# Patient Record
Sex: Male | Born: 1955
Health system: Southern US, Community
[De-identification: ages and names within clinical notes are randomized; demographics above are authoritative.]

## PROBLEM LIST (undated history)

## (undated) DIAGNOSIS — I10 Essential (primary) hypertension: Secondary | ICD-10-CM

## (undated) DIAGNOSIS — N529 Male erectile dysfunction, unspecified: Secondary | ICD-10-CM

## (undated) DIAGNOSIS — G4733 Obstructive sleep apnea (adult) (pediatric): Secondary | ICD-10-CM

## (undated) DIAGNOSIS — I519 Heart disease, unspecified: Secondary | ICD-10-CM

## (undated) DIAGNOSIS — E291 Testicular hypofunction: Secondary | ICD-10-CM

## (undated) DIAGNOSIS — Q2112 Patent foramen ovale: Secondary | ICD-10-CM

## (undated) DIAGNOSIS — M545 Low back pain, unspecified: Secondary | ICD-10-CM

## (undated) DIAGNOSIS — I2510 Atherosclerotic heart disease of native coronary artery without angina pectoris: Secondary | ICD-10-CM

## (undated) DIAGNOSIS — K219 Gastro-esophageal reflux disease without esophagitis: Secondary | ICD-10-CM

## (undated) DIAGNOSIS — E1165 Type 2 diabetes mellitus with hyperglycemia: Secondary | ICD-10-CM

## (undated) DIAGNOSIS — I5022 Chronic systolic (congestive) heart failure: Secondary | ICD-10-CM

## (undated) DIAGNOSIS — E1121 Type 2 diabetes mellitus with diabetic nephropathy: Secondary | ICD-10-CM

## (undated) DIAGNOSIS — E782 Mixed hyperlipidemia: Secondary | ICD-10-CM

## (undated) DIAGNOSIS — I4891 Unspecified atrial fibrillation: Secondary | ICD-10-CM

## (undated) DIAGNOSIS — R001 Bradycardia, unspecified: Secondary | ICD-10-CM

## (undated) DIAGNOSIS — F32A Depression, unspecified: Secondary | ICD-10-CM

## (undated) DIAGNOSIS — I509 Heart failure, unspecified: Secondary | ICD-10-CM

## (undated) DIAGNOSIS — I499 Cardiac arrhythmia, unspecified: Secondary | ICD-10-CM

## (undated) DIAGNOSIS — I480 Paroxysmal atrial fibrillation: Secondary | ICD-10-CM

## (undated) DIAGNOSIS — I495 Sick sinus syndrome: Secondary | ICD-10-CM

## (undated) DIAGNOSIS — I739 Peripheral vascular disease, unspecified: Secondary | ICD-10-CM

## (undated) DIAGNOSIS — N189 Chronic kidney disease, unspecified: Secondary | ICD-10-CM

## (undated) DIAGNOSIS — E119 Type 2 diabetes mellitus without complications: Secondary | ICD-10-CM

## (undated) HISTORY — DX: Patent foramen ovale: Q21.12

## (undated) HISTORY — DX: Essential (primary) hypertension: I10

## (undated) HISTORY — DX: Bradycardia, unspecified: R00.1

## (undated) HISTORY — DX: Peripheral vascular disease, unspecified: I73.9

## (undated) HISTORY — DX: Chronic systolic (congestive) heart failure: I50.22

## (undated) HISTORY — DX: Male erectile dysfunction, unspecified: N52.9

## (undated) HISTORY — DX: Mixed hyperlipidemia: E78.2

## (undated) HISTORY — DX: Type 2 diabetes mellitus with diabetic nephropathy: E11.21

## (undated) HISTORY — DX: Testicular hypofunction: E29.1

## (undated) HISTORY — DX: Heart disease, unspecified: I51.9

## (undated) HISTORY — PX: CARDIAC CATHETERIZATION: SHX172

## (undated) HISTORY — DX: Low back pain, unspecified: M54.50

## (undated) HISTORY — DX: Paroxysmal atrial fibrillation: I48.0

## (undated) HISTORY — DX: Sick sinus syndrome: I49.5

## (undated) HISTORY — DX: Type 2 diabetes mellitus with hyperglycemia: E11.65

## (undated) HISTORY — DX: Atherosclerotic heart disease of native coronary artery without angina pectoris: I25.10

## (undated) HISTORY — DX: Obstructive sleep apnea (adult) (pediatric): G47.33

---

## 2014-07-28 DIAGNOSIS — E1140 Type 2 diabetes mellitus with diabetic neuropathy, unspecified: Secondary | ICD-10-CM | POA: Insufficient documentation

## 2014-07-28 DIAGNOSIS — IMO0002 Reserved for concepts with insufficient information to code with codable children: Secondary | ICD-10-CM

## 2014-07-28 DIAGNOSIS — I480 Paroxysmal atrial fibrillation: Secondary | ICD-10-CM

## 2014-07-28 DIAGNOSIS — E1165 Type 2 diabetes mellitus with hyperglycemia: Secondary | ICD-10-CM

## 2014-07-28 DIAGNOSIS — E782 Mixed hyperlipidemia: Secondary | ICD-10-CM

## 2014-07-28 DIAGNOSIS — E1121 Type 2 diabetes mellitus with diabetic nephropathy: Secondary | ICD-10-CM | POA: Insufficient documentation

## 2014-07-28 HISTORY — DX: Mixed hyperlipidemia: E78.2

## 2014-07-28 HISTORY — DX: Type 2 diabetes mellitus with hyperglycemia: E11.65

## 2014-07-28 HISTORY — DX: Paroxysmal atrial fibrillation: I48.0

## 2014-07-28 HISTORY — DX: Reserved for concepts with insufficient information to code with codable children: IMO0002

## 2014-07-28 HISTORY — DX: Type 2 diabetes mellitus with diabetic neuropathy, unspecified: E11.40

## 2014-07-30 DIAGNOSIS — I10 Essential (primary) hypertension: Secondary | ICD-10-CM | POA: Insufficient documentation

## 2014-07-30 DIAGNOSIS — I110 Hypertensive heart disease with heart failure: Secondary | ICD-10-CM | POA: Insufficient documentation

## 2014-07-30 HISTORY — DX: Essential (primary) hypertension: I10

## 2014-07-30 HISTORY — DX: Hypertensive heart disease with heart failure: I11.0

## 2014-08-30 DIAGNOSIS — I5022 Chronic systolic (congestive) heart failure: Secondary | ICD-10-CM

## 2014-08-30 DIAGNOSIS — I5042 Chronic combined systolic (congestive) and diastolic (congestive) heart failure: Secondary | ICD-10-CM | POA: Insufficient documentation

## 2014-08-30 HISTORY — DX: Chronic systolic (congestive) heart failure: I50.22

## 2014-09-20 ENCOUNTER — Ambulatory Visit: Admit: 2014-09-20 | Disposition: A | Discharge status: Home / Self Care | Payer: Self-pay | Admitting: Internal Medicine

## 2015-01-18 ENCOUNTER — Ambulatory Visit: Payer: BLUE CROSS/BLUE SHIELD | Admitting: Anesthesiology

## 2015-01-18 ENCOUNTER — Encounter: Payer: Self-pay | Admitting: *Deleted

## 2015-01-18 ENCOUNTER — Ambulatory Visit
Admission: RE | Admit: 2015-01-18 | Discharge: 2015-01-18 | Disposition: A | Discharge status: Home / Self Care | Payer: BLUE CROSS/BLUE SHIELD | Source: Ambulatory Visit | Attending: Internal Medicine | Admitting: Internal Medicine

## 2015-01-18 ENCOUNTER — Encounter
Admission: RE | Disposition: A | Discharge status: Home / Self Care | Payer: Self-pay | Source: Ambulatory Visit | Attending: Internal Medicine

## 2015-01-18 DIAGNOSIS — Z8249 Family history of ischemic heart disease and other diseases of the circulatory system: Secondary | ICD-10-CM | POA: Insufficient documentation

## 2015-01-18 DIAGNOSIS — Z79899 Other long term (current) drug therapy: Secondary | ICD-10-CM | POA: Insufficient documentation

## 2015-01-18 DIAGNOSIS — I480 Paroxysmal atrial fibrillation: Secondary | ICD-10-CM | POA: Diagnosis present

## 2015-01-18 DIAGNOSIS — Z813 Family history of other psychoactive substance abuse and dependence: Secondary | ICD-10-CM | POA: Insufficient documentation

## 2015-01-18 DIAGNOSIS — Z7901 Long term (current) use of anticoagulants: Secondary | ICD-10-CM | POA: Insufficient documentation

## 2015-01-18 DIAGNOSIS — Z87891 Personal history of nicotine dependence: Secondary | ICD-10-CM | POA: Insufficient documentation

## 2015-01-18 DIAGNOSIS — E785 Hyperlipidemia, unspecified: Secondary | ICD-10-CM | POA: Diagnosis not present

## 2015-01-18 DIAGNOSIS — I5022 Chronic systolic (congestive) heart failure: Secondary | ICD-10-CM | POA: Insufficient documentation

## 2015-01-18 DIAGNOSIS — Z801 Family history of malignant neoplasm of trachea, bronchus and lung: Secondary | ICD-10-CM | POA: Diagnosis not present

## 2015-01-18 DIAGNOSIS — I10 Essential (primary) hypertension: Secondary | ICD-10-CM | POA: Insufficient documentation

## 2015-01-18 HISTORY — DX: Cardiac arrhythmia, unspecified: I49.9

## 2015-01-18 HISTORY — DX: Essential (primary) hypertension: I10

## 2015-01-18 HISTORY — DX: Type 2 diabetes mellitus without complications: E11.9

## 2015-01-18 HISTORY — PX: ELECTROPHYSIOLOGIC STUDY: SHX172A

## 2015-01-18 HISTORY — DX: Heart failure, unspecified: I50.9

## 2015-01-18 HISTORY — DX: Unspecified atrial fibrillation: I48.91

## 2015-01-18 LAB — GLUCOSE, CAPILLARY: GLUCOSE-CAPILLARY: 109 mg/dL — AB (ref 65–99)

## 2015-01-18 SURGERY — CARDIOVERSION (CATH LAB)
Anesthesia: General

## 2015-01-18 MED ORDER — MIDAZOLAM HCL 2 MG/2ML IJ SOLN
INTRAMUSCULAR | Status: DC | PRN
  Administered 2015-01-18: 2 mg via INTRAVENOUS

## 2015-01-18 MED ORDER — LIDOCAINE HCL (CARDIAC) 20 MG/ML IV SOLN
INTRAVENOUS | Status: DC | PRN
  Administered 2015-01-18: 30 mg via INTRAVENOUS

## 2015-01-18 MED ORDER — PROPOFOL 10 MG/ML IV BOLUS
INTRAVENOUS | Status: DC | PRN
  Administered 2015-01-18: 60 mg via INTRAVENOUS

## 2015-01-18 MED ORDER — SODIUM CHLORIDE 0.9 % IV SOLN
INTRAVENOUS | Status: DC
  Administered 2015-01-18: 08:00:00 via INTRAVENOUS

## 2015-01-18 MED ORDER — FENTANYL CITRATE (PF) 100 MCG/2ML IJ SOLN
INTRAMUSCULAR | Status: DC | PRN
Start: 2015-01-18 — End: 2015-01-18
  Administered 2015-01-18: 50 ug via INTRAVENOUS

## 2015-01-18 NOTE — Transfer of Care (Signed)
Immediate Anesthesia Transfer of Care Note  Patient: [name] [name]  Procedure(s) Performed: Procedure(s): CARDIOVERSION (N/A)  Patient Location: PACU  Anesthesia Type:General  Level of Consciousness: sedated  Airway & Oxygen Therapy: Patient Spontanous Breathing and Patient connected to nasal cannula oxygen  Post-op Assessment: Report given to RN and Post -op Vital signs reviewed and stable  Post vital signs: Reviewed and stable  Last Vitals:  Filed Vitals:   01/18/15 0707  BP: 157/82  Pulse: 85  Temp: 37.1 C  Resp: 16    Complications: No apparent anesthesia complications

## 2015-01-18 NOTE — Anesthesia Procedure Notes (Signed)
Performed by: Allean Found Pre-anesthesia Checklist: Patient identified, Emergency Drugs available, Suction available, Patient being monitored and Timeout performed Oxygen Delivery Method: Nasal cannula Preoxygenation: Pre-oxygenation with 100% oxygen Intubation Type: IV induction Placement Confirmation: positive ETCO2

## 2015-01-18 NOTE — Anesthesia Postprocedure Evaluation (Signed)
  Anesthesia Post-op Note  Patient: [name] [name]  Procedure(s) Performed: Procedure(s): CARDIOVERSION (N/A)  Anesthesia type:General  Patient location: PACU  Post pain: Pain level controlled  Post assessment: Post-op Vital signs reviewed, Patient's Cardiovascular Status Stable, Respiratory Function Stable, Patent Airway and No signs of Nausea or vomiting  Post vital signs: Reviewed and stable  Last Vitals:  Filed Vitals:   01/18/15 0833  BP: 118/63  Pulse: 54  Temp:   Resp: 18    Level of consciousness: awake, alert  and patient cooperative  Complications: No apparent anesthesia complications

## 2015-01-18 NOTE — Anesthesia Preprocedure Evaluation (Signed)
Anesthesia Evaluation    Airway Mallampati: III       Dental  (+) Partial Upper, Partial Lower   Pulmonary former smoker,          Cardiovascular hypertension, +CHF + dysrhythmias Atrial Fibrillation Rhythm:irregular Rate:Normal  Able to climb 2 flights without difficulty   Neuro/Psych    GI/Hepatic   Endo/Other  diabetes  Renal/GU      Musculoskeletal   Abdominal   Peds  Hematology   Anesthesia Other Findings   Reproductive/Obstetrics                             Anesthesia Physical Anesthesia Plan  ASA: III  Anesthesia Plan: General   Post-op Pain Management:    Induction:   Airway Management Planned:   Additional Equipment:   Intra-op Plan:   Post-operative Plan:   Informed Consent: I have reviewed the patients History and Physical, chart, labs and discussed the procedure including the risks, benefits and alternatives for the proposed anesthesia with the patient or authorized representative who has indicated his/her understanding and acceptance.     Plan Discussed with:   Anesthesia Plan Comments:         Anesthesia Quick Evaluation

## 2015-01-18 NOTE — Progress Notes (Signed)
Pt for cardioversion with transfer of care to Anesthesia with monitoring if VS and meds given by anesthesia. Refer to Anesthesia record for details. Dr. Nehemiah Massed notified at 733AM that pt is ready.

## 2015-01-18 NOTE — CV Procedure (Signed)
Electrical Cardioversion Procedure Note [name] [id number] 1956/04/07  Procedure: Electrical Cardioversion Indications:  Atrial Fibrillation  Procedure Details Consent: Risks of procedure as well as the alternatives and risks of each were explained to the (patient/caregiver).  Consent for procedure obtained. Time Out: Verified patient identification, verified procedure, site/side was marked, verified correct patient position, special equipment/implants available, medications/allergies/relevent history reviewed, required imaging and test results available.  Performed  Patient placed on cardiac monitor, pulse oximetry, supplemental oxygen as necessary.  Sedation given: Benzodiazepines and Short-acting barbiturates Pacer pads placed anterior and posterior chest.  Cardioverted 4 time(s).  Cardioverted at [place].  Evaluation Findings: Post procedure EKG shows: NSR Complications: None Patient did tolerate procedure well.   [name] 01/18/2015, 7:51 AM

## 2015-01-25 DIAGNOSIS — R001 Bradycardia, unspecified: Secondary | ICD-10-CM

## 2015-01-25 HISTORY — DX: Bradycardia, unspecified: R00.1

## 2015-01-30 NOTE — Addendum Note (Signed)
Addendum  created 01/30/15 1912 by Molli Barrows, MD   Modules edited: Anesthesia Attestations

## 2015-03-02 ENCOUNTER — Encounter: Payer: Self-pay | Admitting: Internal Medicine

## 2015-08-29 ENCOUNTER — Ambulatory Visit
Admission: RE | Admit: 2015-08-29 | Discharge: 2015-08-29 | Disposition: A | Discharge status: Home / Self Care | Payer: Disability Insurance | Source: Ambulatory Visit | Attending: Family Medicine | Admitting: Family Medicine

## 2015-08-29 ENCOUNTER — Other Ambulatory Visit: Payer: Self-pay | Admitting: Family Medicine

## 2015-08-29 DIAGNOSIS — M5136 Other intervertebral disc degeneration, lumbar region: Secondary | ICD-10-CM | POA: Diagnosis not present

## 2015-08-29 DIAGNOSIS — M25552 Pain in left hip: Secondary | ICD-10-CM | POA: Diagnosis present

## 2015-08-29 DIAGNOSIS — M545 Low back pain: Secondary | ICD-10-CM | POA: Insufficient documentation

## 2016-04-21 DIAGNOSIS — I495 Sick sinus syndrome: Secondary | ICD-10-CM

## 2016-04-21 HISTORY — DX: Sick sinus syndrome: I49.5

## 2017-04-28 DIAGNOSIS — E66813 Obesity, class 3: Secondary | ICD-10-CM

## 2017-04-28 DIAGNOSIS — I519 Heart disease, unspecified: Secondary | ICD-10-CM | POA: Insufficient documentation

## 2017-04-28 HISTORY — DX: Heart disease, unspecified: I51.9

## 2017-04-28 HISTORY — DX: Obesity, class 3: E66.813

## 2017-04-28 HISTORY — DX: Morbid (severe) obesity due to excess calories: E66.01

## 2017-05-26 DIAGNOSIS — R0600 Dyspnea, unspecified: Secondary | ICD-10-CM | POA: Diagnosis not present

## 2017-05-26 DIAGNOSIS — E559 Vitamin D deficiency, unspecified: Secondary | ICD-10-CM | POA: Diagnosis not present

## 2017-05-26 DIAGNOSIS — E662 Morbid (severe) obesity with alveolar hypoventilation: Secondary | ICD-10-CM | POA: Diagnosis not present

## 2017-05-26 DIAGNOSIS — R05 Cough: Secondary | ICD-10-CM | POA: Diagnosis not present

## 2017-05-26 DIAGNOSIS — G4733 Obstructive sleep apnea (adult) (pediatric): Secondary | ICD-10-CM | POA: Diagnosis not present

## 2017-05-26 DIAGNOSIS — R5383 Other fatigue: Secondary | ICD-10-CM | POA: Diagnosis not present

## 2017-06-13 DIAGNOSIS — G4733 Obstructive sleep apnea (adult) (pediatric): Secondary | ICD-10-CM | POA: Diagnosis not present

## 2017-07-17 DIAGNOSIS — E559 Vitamin D deficiency, unspecified: Secondary | ICD-10-CM | POA: Diagnosis not present

## 2017-07-17 DIAGNOSIS — G4733 Obstructive sleep apnea (adult) (pediatric): Secondary | ICD-10-CM | POA: Diagnosis not present

## 2017-07-17 DIAGNOSIS — J4530 Mild persistent asthma, uncomplicated: Secondary | ICD-10-CM | POA: Diagnosis not present

## 2017-07-17 DIAGNOSIS — E662 Morbid (severe) obesity with alveolar hypoventilation: Secondary | ICD-10-CM | POA: Diagnosis not present

## 2017-07-31 DIAGNOSIS — I4891 Unspecified atrial fibrillation: Secondary | ICD-10-CM | POA: Diagnosis not present

## 2017-07-31 DIAGNOSIS — Z6841 Body Mass Index (BMI) 40.0 and over, adult: Secondary | ICD-10-CM | POA: Diagnosis not present

## 2017-07-31 DIAGNOSIS — E1140 Type 2 diabetes mellitus with diabetic neuropathy, unspecified: Secondary | ICD-10-CM | POA: Diagnosis not present

## 2017-07-31 DIAGNOSIS — I10 Essential (primary) hypertension: Secondary | ICD-10-CM | POA: Diagnosis not present

## 2017-07-31 DIAGNOSIS — E782 Mixed hyperlipidemia: Secondary | ICD-10-CM | POA: Diagnosis not present

## 2017-08-08 DIAGNOSIS — G4733 Obstructive sleep apnea (adult) (pediatric): Secondary | ICD-10-CM | POA: Diagnosis not present

## 2017-08-28 DIAGNOSIS — R5383 Other fatigue: Secondary | ICD-10-CM | POA: Diagnosis not present

## 2017-08-28 DIAGNOSIS — J4530 Mild persistent asthma, uncomplicated: Secondary | ICD-10-CM | POA: Diagnosis not present

## 2017-08-28 DIAGNOSIS — E559 Vitamin D deficiency, unspecified: Secondary | ICD-10-CM | POA: Diagnosis not present

## 2017-08-28 DIAGNOSIS — G4733 Obstructive sleep apnea (adult) (pediatric): Secondary | ICD-10-CM | POA: Diagnosis not present

## 2017-09-08 DIAGNOSIS — G4733 Obstructive sleep apnea (adult) (pediatric): Secondary | ICD-10-CM | POA: Diagnosis not present

## 2017-09-21 DIAGNOSIS — H40003 Preglaucoma, unspecified, bilateral: Secondary | ICD-10-CM | POA: Diagnosis not present

## 2017-09-21 DIAGNOSIS — E119 Type 2 diabetes mellitus without complications: Secondary | ICD-10-CM | POA: Diagnosis not present

## 2017-09-21 DIAGNOSIS — Z7984 Long term (current) use of oral hypoglycemic drugs: Secondary | ICD-10-CM | POA: Diagnosis not present

## 2017-10-13 DIAGNOSIS — G4733 Obstructive sleep apnea (adult) (pediatric): Secondary | ICD-10-CM | POA: Diagnosis not present

## 2017-10-13 DIAGNOSIS — J4530 Mild persistent asthma, uncomplicated: Secondary | ICD-10-CM | POA: Diagnosis not present

## 2017-10-13 DIAGNOSIS — R5383 Other fatigue: Secondary | ICD-10-CM | POA: Diagnosis not present

## 2017-10-13 DIAGNOSIS — E559 Vitamin D deficiency, unspecified: Secondary | ICD-10-CM | POA: Diagnosis not present

## 2017-11-04 DIAGNOSIS — I10 Essential (primary) hypertension: Secondary | ICD-10-CM | POA: Diagnosis not present

## 2017-11-04 DIAGNOSIS — I4891 Unspecified atrial fibrillation: Secondary | ICD-10-CM | POA: Diagnosis not present

## 2017-11-04 DIAGNOSIS — E782 Mixed hyperlipidemia: Secondary | ICD-10-CM | POA: Diagnosis not present

## 2017-11-04 DIAGNOSIS — G4733 Obstructive sleep apnea (adult) (pediatric): Secondary | ICD-10-CM | POA: Diagnosis not present

## 2017-11-04 DIAGNOSIS — E1140 Type 2 diabetes mellitus with diabetic neuropathy, unspecified: Secondary | ICD-10-CM | POA: Diagnosis not present

## 2017-11-13 DIAGNOSIS — G4733 Obstructive sleep apnea (adult) (pediatric): Secondary | ICD-10-CM | POA: Diagnosis not present

## 2017-11-13 DIAGNOSIS — R5383 Other fatigue: Secondary | ICD-10-CM | POA: Diagnosis not present

## 2017-11-13 DIAGNOSIS — I739 Peripheral vascular disease, unspecified: Secondary | ICD-10-CM | POA: Diagnosis not present

## 2017-11-13 DIAGNOSIS — E559 Vitamin D deficiency, unspecified: Secondary | ICD-10-CM | POA: Diagnosis not present

## 2017-11-13 DIAGNOSIS — J4530 Mild persistent asthma, uncomplicated: Secondary | ICD-10-CM | POA: Diagnosis not present

## 2017-11-19 DIAGNOSIS — N289 Disorder of kidney and ureter, unspecified: Secondary | ICD-10-CM | POA: Diagnosis not present

## 2017-11-20 DIAGNOSIS — I739 Peripheral vascular disease, unspecified: Secondary | ICD-10-CM | POA: Diagnosis not present

## 2018-01-21 DIAGNOSIS — L03031 Cellulitis of right toe: Secondary | ICD-10-CM | POA: Diagnosis not present

## 2018-01-21 DIAGNOSIS — L03116 Cellulitis of left lower limb: Secondary | ICD-10-CM | POA: Diagnosis not present

## 2018-01-21 DIAGNOSIS — I4891 Unspecified atrial fibrillation: Secondary | ICD-10-CM | POA: Diagnosis not present

## 2018-01-21 DIAGNOSIS — R609 Edema, unspecified: Secondary | ICD-10-CM | POA: Diagnosis not present

## 2018-02-10 DIAGNOSIS — E1140 Type 2 diabetes mellitus with diabetic neuropathy, unspecified: Secondary | ICD-10-CM | POA: Diagnosis not present

## 2018-02-10 DIAGNOSIS — I4891 Unspecified atrial fibrillation: Secondary | ICD-10-CM | POA: Diagnosis not present

## 2018-02-10 DIAGNOSIS — I10 Essential (primary) hypertension: Secondary | ICD-10-CM | POA: Diagnosis not present

## 2018-02-10 DIAGNOSIS — R782 Finding of cocaine in blood: Secondary | ICD-10-CM | POA: Diagnosis not present

## 2018-02-25 ENCOUNTER — Encounter: Payer: Self-pay | Admitting: Cardiology

## 2018-02-25 ENCOUNTER — Ambulatory Visit (INDEPENDENT_AMBULATORY_CARE_PROVIDER_SITE_OTHER): Payer: Medicare Other | Admitting: Cardiology

## 2018-02-25 VITALS — BP 124/62 | HR 79 | Ht 70.0 in | Wt 294.4 lb

## 2018-02-25 DIAGNOSIS — Z7901 Long term (current) use of anticoagulants: Secondary | ICD-10-CM

## 2018-02-25 DIAGNOSIS — I480 Paroxysmal atrial fibrillation: Secondary | ICD-10-CM | POA: Diagnosis not present

## 2018-02-25 DIAGNOSIS — I495 Sick sinus syndrome: Secondary | ICD-10-CM

## 2018-02-25 DIAGNOSIS — I110 Hypertensive heart disease with heart failure: Secondary | ICD-10-CM | POA: Diagnosis not present

## 2018-02-25 DIAGNOSIS — I5022 Chronic systolic (congestive) heart failure: Secondary | ICD-10-CM

## 2018-02-25 DIAGNOSIS — G4733 Obstructive sleep apnea (adult) (pediatric): Secondary | ICD-10-CM | POA: Diagnosis not present

## 2018-02-25 HISTORY — DX: Long term (current) use of anticoagulants: Z79.01

## 2018-02-25 MED ORDER — TORSEMIDE 20 MG PO TABS: 20.0000 mg | ORAL_TABLET | Freq: Two times a day (BID) | ORAL | 3 refills | Status: DC

## 2018-02-25 NOTE — Progress Notes (Signed)
Cardiology Office Note:    Date:  02/25/2018   ID:  [name] [name], DOB [birth year], MRN [id number]  PCP:  Cyndi Bender, PA-C  Cardiologist:  Shirlee More, MD    Referring MD: Cyndi Bender, PA-C    ASSESSMENT:    1. Paroxysmal atrial fibrillation (HCC)   2. Chronic anticoagulation   3. Sinus node dysfunction (HCC)   4. Hypertensive heart disease with heart failure ([place])   5. Chronic systolic CHF (congestive heart failure), NYHA class 3 (HCC)    PLAN:    In order of problems listed above:  1. Stable continue his anticoagulant I am concerned about his cardiomyopathy EF at one time was severely reduced normalized in 2017 now is decompensated heart failure we will recheck echocardiogram if EF is significantly reduced we would need to come up with alternative antiarrhythmic therapy or preferably referral for EP catheter ablation with atrial fibrillation and heart failure.  His EKG shows no evidence of toxicity from his antiarrhythmic drug 2. Continue his anticoagulant 3. Stable CMP Holter monitor in 2018 showing no atrial fibrillation normal sinus node function and no ventricular tachycardia 4. Heart failure is decompensated he has marked fluid overload we will switch from furosemide to torsemide 1 week at his PCP office recheck renal function as well as liver function proBNP and echocardiogram.  I anticipate seeing him back in 3 months to begin to weigh daily also asked him to stop adding dietary salt and stop gabapentin which causes intense sodium retention.  Stop hydrochlorothiazide   Next appointment: 3 months   Medication Adjustments/Labs and Tests Ordered: Current medicines are reviewed at length with the patient today.  Concerns regarding medicines are outlined above.  No orders of the defined types were placed in this encounter.  No orders of the defined types were placed in this encounter.   No chief complaint on file.   History of Present Illness:    [name] [name] is  a 62 y.o. male with a hx of paroxysmal atrial fibrillation treated with Multaq and Xarelto Xarelto previous cardiomyopathy with normalization of left ventricular function heart failure and sick sinus syndrome with bradycardia after cardioversion last seen by me at [facility] cardiology 10/27/2016. Compliance with diet, lifestyle and medications: No he is adding salt to his diet  He does not feel well he finds himself very short of breath with physical activity initially bending over using his upper extremities but he is pleased with the recent response to sleep apnea with CPAP.  He has had brief episodes of rapid heart rhythm but not severe sustained tolerates his anticoagulant without complication no chest pain syncope TIA.  I reviewed records available from his cardiology care at [facility] office and the patient wishes to reestablish care with me Past Medical History:  Diagnosis Date  . Atrial fibrillation ([place])   . Benign essential HTN 07/30/2014  . Bradycardia 01/25/2015  . CHF (congestive heart failure) ([place])   . Chronic systolic CHF (congestive heart failure), NYHA class 3 ([place]) 08/30/2014   Overview:  Global ef 30%  . Diabetes mellitus without complication ([place])   . Dysrhythmia   . Hypertension   . LV dysfunction 04/28/2017  . Mixed hyperlipidemia 07/28/2014  . Paroxysmal atrial fibrillation ([place]) 07/28/2014  . Sinus node dysfunction ([place]) 04/21/2016  . Uncontrolled type 2 diabetes mellitus with microalbuminuric diabetic nephropathy ([place]) 07/28/2014    Past Surgical History:  Procedure Laterality Date  . ELECTROPHYSIOLOGIC STUDY N/A 01/18/2015   Procedure: CARDIOVERSION;  Surgeon: [name], MD;  Location: [facility] ORS;  Service: Cardiovascular;  Laterality: N/A;    Current Medications: No outpatient medications have been marked as taking for the 02/25/18 encounter (Appointment) with Richardo Priest, MD.     Allergies:   Patient has no known allergies.   Social  History   Socioeconomic History  . Marital status: Married    Spouse name: Not on file  . Number of children: Not on file  . Years of education: Not on file  . Highest education level: Not on file  Occupational History  . Not on file  Social Needs  . Financial resource strain: Not on file  . Food insecurity:    Worry: Not on file    Inability: Not on file  . Transportation needs:    Medical: Not on file    Non-medical: Not on file  Tobacco Use  . Smoking status: Former Smoker    Last attempt to quit: 01/18/2012    Years since quitting: 6.1  . Smokeless tobacco: Never Used  Substance and Sexual Activity  . Alcohol use: No  . Drug use: Yes    Frequency: 1.0 times per week    Types: Marijuana  . Sexual activity: Not on file  Lifestyle  . Physical activity:    Days per week: Not on file    Minutes per session: Not on file  . Stress: Not on file  Relationships  . Social connections:    Talks on phone: Not on file    Gets together: Not on file    Attends religious service: Not on file    Active member of club or organization: Not on file    Attends meetings of clubs or organizations: Not on file    Relationship status: Not on file  Other Topics Concern  . Not on file  Social History Narrative  . Not on file     Family History: The patient's family history includes CAD in his brother and mother; Drug abuse in his brother; Lung cancer in his father. ROS:   Please see the history of present illness.    All other systems reviewed and are negative.  EKGs/Labs/Other Studies Reviewed:    The following studies were reviewed today:  EKG:  EKG ordered today.  The ekg ordered today demonstrates [facility] and normal  Recent Labs: Labs from TPN shows a cholesterol 148 HDL 45 LDL 81 and mildly elevated creatinine 1.24.  These labs were performed 11/19/2017.   Physical Exam:    VS:  There were no vitals taken for this visit.    Wt Readings from Last 3 Encounters:  01/18/15  253 lb (114.8 kg)     GEN:  Well nourished, well developed in no acute distress HEENT: Normal NECK: No JVD; No carotid bruits LYMPHATICS: No lymphadenopathy CARDIAC: RRR, no murmurs, rubs, gallops RESPIRATORY:  Clear to auscultation without rales, wheezing or rhonchi  ABDOMEN: Soft, non-tender, non-distended MUSCULOSKELETAL:  3-4+ bilateral to the thigh edema; No deformity  SKIN: Warm and dry NEUROLOGIC:  Alert and oriented x 3 PSYCHIATRIC:  Normal affect    Signed, Shirlee More, MD  02/25/2018 7:50 AM    [place]

## 2018-02-25 NOTE — Patient Instructions (Addendum)
Medication Instructions:  Your physician has recommended you make the following change in your medication:   STOP gabapentin  STOP furosemide (lasix) STOP hydrochlorothiazide   START torsemide (demadex) 20 mg twice daily   Labwork: Your physician recommends that you return for lab work in 1 week: CMP, ProBNP at PCP office.   Testing/Procedures: You had an EKG today.   Your physician has requested that you have an echocardiogram. Echocardiography is a painless test that uses sound waves to create images of your heart. It provides your doctor with information about the size and shape of your heart and how well your heart's chambers and valves are working. This procedure takes approximately one hour. There are no restrictions for this procedure.  Follow-Up: Your physician wants you to follow-up in: 3 months. You will receive a reminder letter in the mail two months in advance. If you don't receive a letter, please call our office to schedule the follow-up appointment.   If you need a refill on your cardiac medications before your next appointment, please call your pharmacy.   Thank you for choosing [facility]! Robyne Peers, RN [phone]

## 2018-02-26 DIAGNOSIS — J4530 Mild persistent asthma, uncomplicated: Secondary | ICD-10-CM | POA: Diagnosis not present

## 2018-02-26 DIAGNOSIS — E559 Vitamin D deficiency, unspecified: Secondary | ICD-10-CM | POA: Diagnosis not present

## 2018-02-26 DIAGNOSIS — G4733 Obstructive sleep apnea (adult) (pediatric): Secondary | ICD-10-CM | POA: Diagnosis not present

## 2018-04-12 ENCOUNTER — Other Ambulatory Visit: Payer: Self-pay

## 2018-04-12 ENCOUNTER — Ambulatory Visit (INDEPENDENT_AMBULATORY_CARE_PROVIDER_SITE_OTHER): Payer: Medicare Other

## 2018-04-12 DIAGNOSIS — I5022 Chronic systolic (congestive) heart failure: Secondary | ICD-10-CM

## 2018-04-12 DIAGNOSIS — I480 Paroxysmal atrial fibrillation: Secondary | ICD-10-CM | POA: Diagnosis not present

## 2018-04-12 MED ORDER — PERFLUTREN LIPID MICROSPHERE: 1.0000 mL | INTRAVENOUS | Status: AC | PRN

## 2018-04-12 NOTE — Progress Notes (Addendum)
Complete echocardiogram with contrast has been performed.   Jimmy Asya Derryberry RDCS, RVT 

## 2018-05-12 DIAGNOSIS — Z7984 Long term (current) use of oral hypoglycemic drugs: Secondary | ICD-10-CM | POA: Diagnosis not present

## 2018-05-12 DIAGNOSIS — H04123 Dry eye syndrome of bilateral lacrimal glands: Secondary | ICD-10-CM | POA: Diagnosis not present

## 2018-05-12 DIAGNOSIS — H40003 Preglaucoma, unspecified, bilateral: Secondary | ICD-10-CM | POA: Diagnosis not present

## 2018-05-12 DIAGNOSIS — H2513 Age-related nuclear cataract, bilateral: Secondary | ICD-10-CM | POA: Diagnosis not present

## 2018-05-13 DIAGNOSIS — Z125 Encounter for screening for malignant neoplasm of prostate: Secondary | ICD-10-CM | POA: Diagnosis not present

## 2018-05-13 DIAGNOSIS — E782 Mixed hyperlipidemia: Secondary | ICD-10-CM | POA: Diagnosis not present

## 2018-05-13 DIAGNOSIS — E1140 Type 2 diabetes mellitus with diabetic neuropathy, unspecified: Secondary | ICD-10-CM | POA: Diagnosis not present

## 2018-05-13 DIAGNOSIS — I10 Essential (primary) hypertension: Secondary | ICD-10-CM | POA: Diagnosis not present

## 2018-05-13 DIAGNOSIS — I4891 Unspecified atrial fibrillation: Secondary | ICD-10-CM | POA: Diagnosis not present

## 2018-05-13 DIAGNOSIS — Z79899 Other long term (current) drug therapy: Secondary | ICD-10-CM | POA: Diagnosis not present

## 2018-05-25 NOTE — Progress Notes (Signed)
Cardiology Office Note:    Date:  05/26/2018   ID:  Basim Bartnik, DOB 1956-02-13, MRN [id number]  PCP:  Cyndi Bender, PA-C  Cardiologist:  Shirlee More, MD    Referring MD: Cyndi Bender, PA-C    ASSESSMENT:    1. Hypertensive heart disease with heart failure ([place])   2. Chronic combined systolic and diastolic heart failure (HCC)   3. Paroxysmal atrial fibrillation ([place])   4. Uncontrolled type 2 diabetes mellitus with microalbuminuric diabetic nephropathy ([place])   5. Chronic anticoagulation   6. Mixed hyperlipidemia   7. High risk medication use    PLAN:    In order of problems listed above:  1. He is decompensated he has edema takes Actos discontinued and unfortunately is not weighing daily adding salt to his diet.  I increased his diuretic 2. See above ejection fraction improved with resumption of sinus rhythm 3. Continue anticoagulant check EKG continue his antiarrhythmic drug 4. Improved A1c at target stop his Actos 5. Continue his anticoagulant 6. Duration to continue with statin he has arrangements for follow-up lipid profile 7. Check EKG on Rythmol   Next appointment: 3 months    Medication Adjustments/Labs and Tests Ordered: Current medicines are reviewed at length with the patient today.  Concerns regarding medicines are outlined above.  No orders of the defined types were placed in this encounter.  No orders of the defined types were placed in this encounter.   Chief Complaint  Patient presents with  . Follow-up  . Atrial Fibrillation  . Congestive Heart Failure    History of Present Illness:    Rhonda Vangieson is a 62 y.o. male with a hx of paroxysmal atrial fibrillation treated with Multaq and Xarelto,  previous cardiomyopathy with normalization of left ventricular function heart failure and sick sinus syndrome with bradycardia after cardioversion  He was last seen 02/25/2018. Compliance with diet, lifestyle and medications: No he does not weigh  himself and continues to add salt to his food  He is improved but disappointed that is not back to normal he remains short of breath has edema he is taking Actos which is contraindicated with heart failure I will stop it today as his A1c is at target and strongly encouraged him to weigh daily and stop adding salt to his diet will increase the dose of his diuretic.  He is on a antiarrhythmic drug for atrial fibrillation we will check his EKG he appears to be in sinus rhythm and tolerates his anticoagulant without bleeding complication.  Recently had lipids done as severe dyslipidemia with a LDL at 207 and was placed on a high intensity statin he has arrangements for follow-up lipid profile with his PCP and I strongly encouraged him to follow through he also has CKD with a creatinine of 1.79 and microalbuminuria. Past Medical History:  Diagnosis Date  . Atrial fibrillation ([place])   . Benign essential HTN 07/30/2014  . Bradycardia 01/25/2015  . CHF (congestive heart failure) ([place])   . Chronic systolic CHF (congestive heart failure), NYHA class 3 ([place]) 08/30/2014   Overview:  Global ef 30%  . Diabetes mellitus without complication ([place])   . Dysrhythmia   . Hypertension   . LV dysfunction 04/28/2017  . Mixed hyperlipidemia 07/28/2014  . Paroxysmal atrial fibrillation ([place]) 07/28/2014  . Sinus node dysfunction ([place]) 04/21/2016  . Uncontrolled type 2 diabetes mellitus with microalbuminuric diabetic nephropathy ([place]) 07/28/2014    Past Surgical History:  Procedure Laterality Date  . ELECTROPHYSIOLOGIC STUDY N/A  01/18/2015   Procedure: CARDIOVERSION;  Surgeon: [name], MD;  Location: [facility] ORS;  Service: Cardiovascular;  Laterality: N/A;    Current Medications: Current Meds  Medication Sig  . albuterol (PROVENTIL HFA;VENTOLIN HFA) 108 (90 Base) MCG/ACT inhaler INHALE 2 PUFFS BY MOUTH EVERY 4 TO 6 HOURS AS NEEDED  . atorvastatin (LIPITOR) 40 MG tablet TAKE 1 TABLET BY MOUTH ONCE DAILY FOR CHOLESTEROL    . BREO ELLIPTA 100-25 MCG/INH AEPB INHALE 1 PUFF BY MOUTH ONCE DAILY  . glipiZIDE (GLUCOTROL) 10 MG tablet Take 10 mg by mouth 2 (two) times daily before a meal.  . lisinopril (PRINIVIL,ZESTRIL) 10 MG tablet Take 1 tablet by mouth daily.  . metFORMIN (GLUCOPHAGE) 1000 MG tablet Take 1,000 mg by mouth 2 (two) times daily with a meal.  . pioglitazone (ACTOS) 45 MG tablet Take 45 mg by mouth daily.  . propafenone (RYTHMOL) 225 MG tablet Take 1 tablet by mouth 2 (two) times daily.  . rivaroxaban (XARELTO) 20 MG TABS tablet Take 20 mg by mouth daily with supper.  . sitaGLIPtin (JANUVIA) 100 MG tablet Take 100 mg by mouth daily.  [name] torsemide (DEMADEX) 20 MG tablet Take 1 tablet (20 mg total) by mouth 2 (two) times daily.  . Vitamin D, Ergocalciferol, (DRISDOL) 50000 units CAPS capsule Take 1 capsule by mouth once a week.     Allergies:   Patient has no known allergies.   Social History   Socioeconomic History  . Marital status: Married    Spouse name: Not on file  . Number of children: Not on file  . Years of education: Not on file  . Highest education level: Not on file  Occupational History  . Not on file  Social Needs  . Financial resource strain: Not on file  . Food insecurity:    Worry: Not on file    Inability: Not on file  . Transportation needs:    Medical: Not on file    Non-medical: Not on file  Tobacco Use  . Smoking status: Former Smoker    Last attempt to quit: 01/17/2002    Years since quitting: 16.3  . Smokeless tobacco: Never Used  Substance and Sexual Activity  . Alcohol use: No  . Drug use: Yes    Frequency: 1.0 times per week    Types: Marijuana  . Sexual activity: Not on file  Lifestyle  . Physical activity:    Days per week: Not on file    Minutes per session: Not on file  . Stress: Not on file  Relationships  . Social connections:    Talks on phone: Not on file    Gets together: Not on file    Attends religious service: Not on file    Active  member of club or organization: Not on file    Attends meetings of clubs or organizations: Not on file    Relationship status: Not on file  Other Topics Concern  . Not on file  Social History Narrative  . Not on file     Family History: The patient's family history includes CAD in his brother and mother; Drug abuse in his brother; Lung cancer in his father. ROS:   Please see the history of present illness.    All other systems reviewed and are negative.  EKGs/Labs/Other Studies Reviewed:    The following studies were reviewed today:  EKG:  EKG ordered today.  The ekg ordered today demonstrates sinus rhythm and is normal including  QT interval  Echo 04/12/18 with EF 60-65%  Recent Labs:   05/13/2018 cholesterol 301 LDL 207 creatinine 1.79 A1c 6.7   Physical Exam:    VS:  BP 132/62 (BP Location: Right Arm, Patient Position: Sitting, Cuff Size: Large)   Pulse 91   Ht 5\' 10"  (1.778 m)   Wt 298 lb (135.2 kg)   SpO2 96%   BMI 42.76 kg/m     Wt Readings from Last 3 Encounters:  05/26/18 298 lb (135.2 kg)  02/25/18 294 lb 6.4 oz (133.5 kg)  01/18/15 253 lb (114.8 kg)     GEN:   Well nourished, well developed in no acute distress HEENT: Normal NECK: No JVD; No carotid bruits LYMPHATICS: No lymphadenopathy CARDIAC:  RRR, no murmurs, rubs, gallops RESPIRATORY:  Clear to auscultation without rales, wheezing or rhonchi  ABDOMEN: Soft, non-tender, non-distended MUSCULOSKELETAL:  2= to the knee bilateral edema; No deformity  SKIN: Warm and dry NEUROLOGIC:  Alert and oriented x 3 PSYCHIATRIC:  Normal affect    Signed, Shirlee More, MD  05/26/2018 10:25 AM    [place]

## 2018-05-26 ENCOUNTER — Ambulatory Visit (INDEPENDENT_AMBULATORY_CARE_PROVIDER_SITE_OTHER): Payer: Medicare Other | Admitting: Cardiology

## 2018-05-26 ENCOUNTER — Encounter: Payer: Self-pay | Admitting: Cardiology

## 2018-05-26 VITALS — BP 132/62 | HR 91 | Ht 70.0 in | Wt 298.0 lb

## 2018-05-26 DIAGNOSIS — E782 Mixed hyperlipidemia: Secondary | ICD-10-CM

## 2018-05-26 DIAGNOSIS — Z7901 Long term (current) use of anticoagulants: Secondary | ICD-10-CM | POA: Diagnosis not present

## 2018-05-26 DIAGNOSIS — I480 Paroxysmal atrial fibrillation: Secondary | ICD-10-CM | POA: Diagnosis not present

## 2018-05-26 DIAGNOSIS — Z79899 Other long term (current) drug therapy: Secondary | ICD-10-CM | POA: Diagnosis not present

## 2018-05-26 DIAGNOSIS — E1165 Type 2 diabetes mellitus with hyperglycemia: Secondary | ICD-10-CM | POA: Diagnosis not present

## 2018-05-26 DIAGNOSIS — IMO0002 Reserved for concepts with insufficient information to code with codable children: Secondary | ICD-10-CM

## 2018-05-26 DIAGNOSIS — I110 Hypertensive heart disease with heart failure: Secondary | ICD-10-CM | POA: Diagnosis not present

## 2018-05-26 DIAGNOSIS — E1121 Type 2 diabetes mellitus with diabetic nephropathy: Secondary | ICD-10-CM

## 2018-05-26 DIAGNOSIS — I5042 Chronic combined systolic (congestive) and diastolic (congestive) heart failure: Secondary | ICD-10-CM | POA: Diagnosis not present

## 2018-05-26 HISTORY — DX: Other long term (current) drug therapy: Z79.899

## 2018-05-26 MED ORDER — TORSEMIDE 20 MG PO TABS: ORAL_TABLET | ORAL | 0 refills | Status: DC

## 2018-05-26 NOTE — Patient Instructions (Addendum)
Medication Instructions:  Your physician has recommended you make the following change in your medication:   STOP actos  INCREASE torsemide (demadex) 20 mg: Take 1 tablet twice daily. Take 1 extra tablet in the morning on Monday, Wednesday, and Friday  If you need a refill on your cardiac medications before your next appointment, please call your pharmacy.   Lab work: None  If you have labs (blood work) drawn today and your tests are completely normal, you will receive your results only by: [name] MyChart Message (if you have MyChart) OR . A paper copy in the mail If you have any lab test that is abnormal or we need to change your treatment, we will call you to review the results.  Testing/Procedures: You had an EKG today.   Follow-Up: At [facility], you and your health needs are our priority.  As part of our continuing mission to provide you with exceptional heart care, we have created designated Provider Care Teams.  These Care Teams include your primary Cardiologist (physician) and Advanced Practice Providers (APPs -  Physician Assistants and Nurse Practitioners) who all work together to provide you with the care you need, when you need it. You will need a follow up appointment in 3 months.  Please call our office 2 months in advance to schedule this appointment.  You may see  or another member of our [employer] Provider Team in [place]: Jenne Campus, MD . Jyl Heinz, MD  Any Other Special Instructions Will Be Listed Below (If Applicable).  Weigh yourself at the same time every day!     Heart Failure  Weigh yourself every morning when you first wake up and record on a calender or note pad, bring this to your office visits. Using a pill tender can help with taking your medications consistently.  Limit your fluid intake to 2 liters daily  Limit your sodium intake to less than 2-3 grams daily. Ask if you need dietary teaching.  If you gain more than 3 pounds (from  your dry weight ), double your dose of diuretic for the day.  If you gain more than 5 pounds (from your dry weight), double your dose of lasix and call your heart failure doctor.  Please do not smoke tobacco since it is very bad for your heart.  Please do not drink alcohol since it can worsen your heart failure.Also avoid OTC nonsteroidal drugs, such as advil, aleve and motrin.  Try to exercise for at least 30 minutes every day because this will help your heart be more efficient. You may be eligible for supervised cardiac rehab, ask your physician.

## 2018-06-14 DIAGNOSIS — N289 Disorder of kidney and ureter, unspecified: Secondary | ICD-10-CM | POA: Diagnosis not present

## 2018-06-14 DIAGNOSIS — D649 Anemia, unspecified: Secondary | ICD-10-CM | POA: Diagnosis not present

## 2018-07-20 ENCOUNTER — Other Ambulatory Visit: Payer: Self-pay | Admitting: Cardiology

## 2018-07-28 DIAGNOSIS — G4733 Obstructive sleep apnea (adult) (pediatric): Secondary | ICD-10-CM | POA: Diagnosis not present

## 2018-07-28 DIAGNOSIS — J4530 Mild persistent asthma, uncomplicated: Secondary | ICD-10-CM | POA: Diagnosis not present

## 2018-08-13 DIAGNOSIS — E782 Mixed hyperlipidemia: Secondary | ICD-10-CM | POA: Diagnosis not present

## 2018-08-13 DIAGNOSIS — Z1331 Encounter for screening for depression: Secondary | ICD-10-CM | POA: Diagnosis not present

## 2018-08-13 DIAGNOSIS — I4891 Unspecified atrial fibrillation: Secondary | ICD-10-CM | POA: Diagnosis not present

## 2018-08-13 DIAGNOSIS — I10 Essential (primary) hypertension: Secondary | ICD-10-CM | POA: Diagnosis not present

## 2018-08-13 DIAGNOSIS — E1140 Type 2 diabetes mellitus with diabetic neuropathy, unspecified: Secondary | ICD-10-CM | POA: Diagnosis not present

## 2018-08-31 NOTE — Progress Notes (Deleted)
Cardiology Office Note:    Date:  08/31/2018   ID:  [name] [name], DOB October 08, 1955, MRN [id number]  PCP:  [name] Bender, PA-C  Cardiologist:  [name] More, MD    Referring MD: [name] Bender, PA-C    ASSESSMENT:    No diagnosis found. PLAN:    In order of problems listed above:  1. ***   Next appointment: ***   Medication Adjustments/Labs and Tests Ordered: Current medicines are reviewed at length with the patient today.  Concerns regarding medicines are outlined above.  No orders of the defined types were placed in this encounter.  No orders of the defined types were placed in this encounter.   No chief complaint on file.   History of Present Illness:    [name] [name] is a 63 y.o. male with a hx of paroxysmal atrial fibrillation treated with Multaq and Xarelto,  previous cardiomyopathy with normalization of left ventricular function heart failure and sick sinus syndrome with bradycardia after cardioversion  last seen 05/26/18. Compliance with diet, lifestyle and medications: *** Past Medical History:  Diagnosis Date  . Atrial fibrillation ([place])   . Benign essential HTN 07/30/2014  . Bradycardia 01/25/2015  . CHF (congestive heart failure) ([place])   . Chronic systolic CHF (congestive heart failure), NYHA class 3 ([place]) 08/30/2014   Overview:  Global ef 30%  . Diabetes mellitus without complication ([place])   . Dysrhythmia   . Hypertension   . LV dysfunction 04/28/2017  . Mixed hyperlipidemia 07/28/2014  . Paroxysmal atrial fibrillation ([place]) 07/28/2014  . Sinus node dysfunction ([place]) 04/21/2016  . Uncontrolled type 2 diabetes mellitus with microalbuminuric diabetic nephropathy ([place]) 07/28/2014    Past Surgical History:  Procedure Laterality Date  . ELECTROPHYSIOLOGIC STUDY N/A 01/18/2015   Procedure: CARDIOVERSION;  Surgeon: [name] Skains, MD;  Location: [facility] ORS;  Service: Cardiovascular;  Laterality: N/A;    Current Medications: No outpatient medications have been  marked as taking for the 09/01/18 encounter (Appointment) with Richardo Priest, MD.     Allergies:   Patient has no known allergies.   Social History   Socioeconomic History  . Marital status: Married    Spouse name: Not on file  . Number of children: Not on file  . Years of education: Not on file  . Highest education level: Not on file  Occupational History  . Not on file  Social Needs  . Financial resource strain: Not on file  . Food insecurity:    Worry: Not on file    Inability: Not on file  . Transportation needs:    Medical: Not on file    Non-medical: Not on file  Tobacco Use  . Smoking status: Former Smoker    Last attempt to quit: 01/17/2002    Years since quitting: 16.6  . Smokeless tobacco: Never Used  Substance and Sexual Activity  . Alcohol use: No  . Drug use: Yes    Frequency: 1.0 times per week    Types: Marijuana  . Sexual activity: Not on file  Lifestyle  . Physical activity:    Days per week: Not on file    Minutes per session: Not on file  . Stress: Not on file  Relationships  . Social connections:    Talks on phone: Not on file    Gets together: Not on file    Attends religious service: Not on file    Active member of club or organization: Not on file    Attends meetings of  clubs or organizations: Not on file    Relationship status: Not on file  Other Topics Concern  . Not on file  Social History Narrative  . Not on file     Family History: The patient's ***family history includes CAD in his brother and mother; Drug abuse in his brother; Lung cancer in his father. ROS:   Please see the history of present illness.    All other systems reviewed and are negative.  EKGs/Labs/Other Studies Reviewed:    The following studies were reviewed today:  EKG:  EKG ordered today and personally reviewed.  The ekg ordered today demonstrates ***  Recent Labs: No results found for requested labs within last 8760 hours.  Recent Lipid Panel No results  found for: CHOL, TRIG, HDL, CHOLHDL, VLDL, LDLCALC, LDLDIRECT  Physical Exam:    VS:  There were no vitals taken for this visit.    Wt Readings from Last 3 Encounters:  05/26/18 298 lb (135.2 kg)  02/25/18 294 lb 6.4 oz (133.5 kg)  01/18/15 253 lb (114.8 kg)     GEN: *** Well nourished, well developed in no acute distress HEENT: Normal NECK: No JVD; No carotid bruits LYMPHATICS: No lymphadenopathy CARDIAC: ***RRR, no murmurs, rubs, gallops RESPIRATORY:  Clear to auscultation without rales, wheezing or rhonchi  ABDOMEN: Soft, non-tender, non-distended MUSCULOSKELETAL:  No edema; No deformity  SKIN: Warm and dry NEUROLOGIC:  Alert and oriented x 3 PSYCHIATRIC:  Normal affect    Signed, [name] More, MD  08/31/2018 1:28 PM    [facility]

## 2018-09-01 ENCOUNTER — Ambulatory Visit (INDEPENDENT_AMBULATORY_CARE_PROVIDER_SITE_OTHER): Payer: Medicare Other | Admitting: Cardiology

## 2018-09-01 ENCOUNTER — Encounter: Payer: Self-pay | Admitting: Cardiology

## 2018-09-01 ENCOUNTER — Other Ambulatory Visit: Payer: Self-pay

## 2018-09-01 VITALS — BP 118/58 | HR 95 | Ht 70.0 in | Wt 298.6 lb

## 2018-09-01 DIAGNOSIS — I110 Hypertensive heart disease with heart failure: Secondary | ICD-10-CM | POA: Diagnosis not present

## 2018-09-01 DIAGNOSIS — Z7901 Long term (current) use of anticoagulants: Secondary | ICD-10-CM

## 2018-09-01 DIAGNOSIS — I480 Paroxysmal atrial fibrillation: Secondary | ICD-10-CM | POA: Diagnosis not present

## 2018-09-01 DIAGNOSIS — Z79899 Other long term (current) drug therapy: Secondary | ICD-10-CM

## 2018-09-01 MED ORDER — TORSEMIDE 20 MG PO TABS: ORAL_TABLET | ORAL | 1 refills | Status: DC

## 2018-09-01 MED ORDER — ROSUVASTATIN CALCIUM 20 MG PO TABS: 20.0000 mg | ORAL_TABLET | Freq: Every day | ORAL | 5 refills | Status: DC

## 2018-09-01 MED ORDER — TORSEMIDE 20 MG PO TABS: 20.0000 mg | ORAL_TABLET | Freq: Two times a day (BID) | ORAL | 1 refills | Status: DC

## 2018-09-01 NOTE — Patient Instructions (Addendum)
Medication Instructions:  Your physician has recommended you make the following change in your medication:   INCREASE torsemide (demadex) 20 mg: Take 1 tablet three times daily every day (fluid pill)  START rosuvastatin (crestor) 20 mg: Take 1 tablet daily (cholesterol medication)  If you need a refill on your cardiac medications before your next appointment, please call your pharmacy.   Lab work: None  If you have labs (blood work) drawn today and your tests are completely normal, you will receive your results only by: [name] MyChart Message (if you have MyChart) OR . A paper copy in the mail If you have any lab test that is abnormal or we need to change your treatment, we will call you to review the results.  Testing/Procedures: You had an EKG today.   Follow-Up: At [facility], you and your health needs are our priority.  As part of our continuing mission to provide you with exceptional heart care, we have created designated Provider Care Teams.  These Care Teams include your primary Cardiologist (physician) and Advanced Practice Providers (APPs -  Physician Assistants and Nurse Practitioners) who all work together to provide you with the care you need, when you need it. You will need a follow up appointment in 6 months.     Rosuvastatin Tablets What is this medicine? ROSUVASTATIN (roe SOO va sta tin) is known as a HMG-CoA reductase inhibitor or 'statin'. It lowers cholesterol and triglycerides in the blood. This drug may also reduce the risk of heart attack, stroke, or other health problems in patients with risk factors for heart disease. Diet and lifestyle changes are often used with this drug. This medicine may be used for other purposes; ask your health care provider or pharmacist if you have questions. COMMON BRAND NAME(S): Crestor What should I tell my health care provider before I take this medicine? They need to know if you have any of these conditions: -diabetes -if you  often drink alcohol -history of stroke -kidney disease -liver disease -muscle aches or weakness -thyroid disease -an unusual or allergic reaction to rosuvastatin, other medicines, foods, dyes, or preservatives -pregnant or trying to get pregnant -breast-feeding How should I use this medicine? Take this medicine by mouth with a glass of water. Follow the directions on the prescription label. Do not cut, crush or chew this medicine. You can take this medicine with or without food. Take your doses at regular intervals. Do not take your medicine more often than directed. Talk to your pediatrician regarding the use of this medicine in children. While this drug may be prescribed for children as young as 49 years old for selected conditions, precautions do apply. Overdosage: If you think you have taken too much of this medicine contact a poison control center or emergency room at once. NOTE: This medicine is only for you. Do not share this medicine with others. What if I miss a dose? If you miss a dose, take it as soon as you can. If your next dose is to be taken in less than 12 hours, then do not take the missed dose. Take the next dose at your regular time. Do not take double or extra doses. What may interact with this medicine? Do not take this medicine with any of the following medications: -herbal medicines like red yeast rice This medicine may also interact with the following medications: -alcohol -antacids containing aluminum hydroxide or magnesium hydroxide -cyclosporine -other medicines for high cholesterol -some medicines for HIV infection -warfarin This list  may not describe all possible interactions. Give your health care provider a list of all the medicines, herbs, non-prescription drugs, or dietary supplements you use. Also tell them if you smoke, drink alcohol, or use illegal drugs. Some items may interact with your medicine. What should I watch for while using this medicine? Visit  your doctor or health care professional for regular check-ups. You may need regular tests to make sure your liver is working properly. Your health care professional may tell you to stop taking this medicine if you develop muscle problems. If your muscle problems do not go away after stopping this medicine, contact your health care professional. Do not become pregnant while taking this medicine. Women should inform their health care professional if they wish to become pregnant or think they might be pregnant. There is a potential for serious side effects to an unborn child. Talk to your health care professional or pharmacist for more information. Do not breast-feed an infant while taking this medicine. This medicine may affect blood sugar levels. If you have diabetes, check with your doctor or health care professional before you change your diet or the dose of your diabetic medicine. If you are going to need surgery or other procedure, tell your doctor that you are using this medicine. This drug is only part of a total heart-health program. Your doctor or a dietician can suggest a low-cholesterol and low-fat diet to help. Avoid alcohol and smoking, and keep a proper exercise schedule. This medicine may cause a decrease in Co-Enzyme Q-10. You should make sure that you get enough Co-Enzyme Q-10 while you are taking this medicine. Discuss the foods you eat and the vitamins you take with your health care professional. What side effects may I notice from receiving this medicine? Side effects that you should report to your doctor or health care professional as soon as possible: -allergic reactions like skin rash, itching or hives, swelling of the face, lips, or tongue -dark urine -fever -joint pain -muscle cramps, pain -redness, blistering, peeling or loosening of the skin, including inside the mouth -trouble passing urine or change in the amount of urine -unusually weak or tired -yellowing of the eyes or  skin Side effects that usually do not require medical attention (report to your doctor or health care professional if they continue or are bothersome): -constipation -heartburn -nausea -stomach gas, pain, upset This list may not describe all possible side effects. Call your doctor for medical advice about side effects. You may report side effects to FDA at 1-800-FDA-1088. Where should I keep my medicine? Keep out of the reach of children. Store at room temperature between 20 and 25 degrees C (68 and 77 degrees F). Keep container tightly closed (protect from moisture). Throw away any unused medicine after the expiration date. NOTE: This sheet is a summary. It may not cover all possible information. If you have questions about this medicine, talk to your doctor, pharmacist, or health care provider.  2019 Elsevier/Gold Standard (2017-02-10 12:42:43)

## 2018-09-01 NOTE — Progress Notes (Signed)
Cardiology Office Note:    Date:  09/01/2018   ID:  [name] [name], DOB 01-14-1956, MRN [id number]  PCP:  Cyndi Bender, PA-C  Cardiologist:  Shirlee More, MD    Referring MD: Cyndi Bender, PA-C    ASSESSMENT:    1. Paroxysmal atrial fibrillation (HCC)   2. High risk medication use   3. Chronic anticoagulation   4. Hypertensive heart disease with heart failure ([place])    PLAN:    In order of problems listed above:  1. Stable in sinus rhythm continue his antiarrhythmic drug and his anticoagulant 2. Stable continue his antiarrhythmic drug 3. Stable continue his anticoagulant 4. Unfortunately remains edematous we will increase his diuretic again he is on appropriate diabetic treatment using SGLT2 inhibitor as well as GLP-1 agent. 5. Severe hyperlipidemia restart his statin he did not tolerate atorvastatin with muscle toxicity and will start rosuvastatin 20 mg daily follow-up labs including lipid profile renal liver and proBNP with his PCP in May.   Next appointment: 6 months or sooner if he fails to improve   Medication Adjustments/Labs and Tests Ordered: Current medicines are reviewed at length with the patient today.  Concerns regarding medicines are outlined above.  No orders of the defined types were placed in this encounter.  No orders of the defined types were placed in this encounter.   Chief Complaint  Patient presents with  . Follow-up  . Atrial Fibrillation  . Congestive Heart Failure    History of Present Illness:    [name] [name] is a 63 y.o. male with a hx of paroxysmal atrial fibrillation treated with Multaq and Xarelto,  previous cardiomyopathy with normalization of left ventricular function heart failure and sick sinus syndrome with bradycardia after cardioversion  last seen 05/26/18. Compliance with diet, lifestyle and medications: Yes but he no longer weighs daily  Is no palpitation recurrence of atrial fibrillation and tolerates his antiarrhythmic  drug and his anticoagulant without side effects of bleeding.  Unfortunately despite stopping Actos he remains edematous and his heart failure is decompensated we will increase his diuretic dosage.  Recent labs done in his PCP office were reassuring except for his lipid profile with an LDL of 207 he will restart a high intensity statin follow-up labs at his next PCP office visit in May.  Prior to leaving the office we will do an EKG with his antiarrhythmic drug.  No shortness of breath chest pain or syncope Past Medical History:  Diagnosis Date  . Atrial fibrillation ([place])   . Benign essential HTN 07/30/2014  . Bradycardia 01/25/2015  . CHF (congestive heart failure) ([place])   . Chronic systolic CHF (congestive heart failure), NYHA class 3 ([place]) 08/30/2014   Overview:  Global ef 30%  . Diabetes mellitus without complication ([place])   . Dysrhythmia   . Hypertension   . LV dysfunction 04/28/2017  . Mixed hyperlipidemia 07/28/2014  . Paroxysmal atrial fibrillation ([place]) 07/28/2014  . Sinus node dysfunction ([place]) 04/21/2016  . Uncontrolled type 2 diabetes mellitus with microalbuminuric diabetic nephropathy ([place]) 07/28/2014    Past Surgical History:  Procedure Laterality Date  . ELECTROPHYSIOLOGIC STUDY N/A 01/18/2015   Procedure: CARDIOVERSION;  Surgeon: [name], MD;  Location: [facility] ORS;  Service: Cardiovascular;  Laterality: N/A;    Current Medications: Current Meds  Medication Sig  . albuterol (PROVENTIL HFA;VENTOLIN HFA) 108 (90 Base) MCG/ACT inhaler INHALE 2 PUFFS BY MOUTH EVERY 4 TO 6 HOURS AS NEEDED  . BREO ELLIPTA 100-25 MCG/INH AEPB INHALE 1 PUFF  BY MOUTH ONCE DAILY  . FARXIGA 10 MG TABS tablet TAKE 1 TABLET BY MOUTH ONCE DAILY FOR DIABETES  . glipiZIDE (GLUCOTROL) 10 MG tablet Take 10 mg by mouth 2 (two) times daily before a meal.  . lisinopril (PRINIVIL,ZESTRIL) 10 MG tablet Take 1 tablet by mouth daily.  . metFORMIN (GLUCOPHAGE) 1000 MG tablet Take 1,000 mg by mouth 2 (two) times daily  with a meal.  . propafenone (RYTHMOL) 225 MG tablet Take 1 tablet by mouth 2 (two) times daily.  . rivaroxaban (XARELTO) 20 MG TABS tablet Take 20 mg by mouth daily with supper.  . sitaGLIPtin (JANUVIA) 100 MG tablet Take 100 mg by mouth daily.  [name] torsemide (DEMADEX) 20 MG tablet TAKE 1 TABLET BY MOUTH TWICE DAILY. IN THE MORNING ON MONDAY, WEDNESDAY AND FRIDAY TAKE 1 EXTRA TABLET     Allergies:   Patient has no known allergies.   Social History   Socioeconomic History  . Marital status: Married    Spouse name: Not on file  . Number of children: Not on file  . Years of education: Not on file  . Highest education level: Not on file  Occupational History  . Not on file  Social Needs  . Financial resource strain: Not on file  . Food insecurity:    Worry: Not on file    Inability: Not on file  . Transportation needs:    Medical: Not on file    Non-medical: Not on file  Tobacco Use  . Smoking status: Former Smoker    Last attempt to quit: 01/17/2002    Years since quitting: 16.6  . Smokeless tobacco: Never Used  Substance and Sexual Activity  . Alcohol use: No  . Drug use: Yes    Frequency: 1.0 times per week    Types: Marijuana  . Sexual activity: Not on file  Lifestyle  . Physical activity:    Days per week: Not on file    Minutes per session: Not on file  . Stress: Not on file  Relationships  . Social connections:    Talks on phone: Not on file    Gets together: Not on file    Attends religious service: Not on file    Active member of club or organization: Not on file    Attends meetings of clubs or organizations: Not on file    Relationship status: Not on file  Other Topics Concern  . Not on file  Social History Narrative  . Not on file     Family History: The patient's family history includes CAD in his brother and mother; Drug abuse in his brother; Lung cancer in his father. ROS:   Please see the history of present illness.    All other systems reviewed  and are negative.  EKGs/Labs/Other Studies Reviewed:    The following studies were reviewed today:  EKG:  EKG ordered today and personally reviewed.  The ekg ordered today demonstrates sinus rhythm and is normal  Recent Labs: No results found for requested labs within last 8760 hours.  Recent Lipid Panel No results found for: CHOL, TRIG, HDL, CHOLHDL, VLDL, LDLCALC, LDLDIRECT  Physical Exam:    VS:  BP (!) 118/58 (BP Location: Right Arm, Patient Position: Sitting, Cuff Size: Large)   Pulse 95   Ht 5\' 10"  (1.778 m)   Wt 298 lb 9.6 oz (135.4 kg)   SpO2 93%   BMI 42.84 kg/m     Wt Readings from Last 3  Encounters:  09/01/18 298 lb 9.6 oz (135.4 kg)  05/26/18 298 lb (135.2 kg)  02/25/18 294 lb 6.4 oz (133.5 kg)     GEN: Remains obese well nourished, well developed in no acute distress HEENT: Normal NECK: No JVD; No carotid bruits LYMPHATICS: No lymphadenopathy CARDIAC: RRR, no murmurs, rubs, gallops RESPIRATORY:  Clear to auscultation without rales, wheezing or rhonchi  ABDOMEN: Soft, non-tender, non-distended MUSCULOSKELETAL: 2-3+ bilateral to the knee edema; No deformity  SKIN: Warm and dry NEUROLOGIC:  Alert and oriented x 3 PSYCHIATRIC:  Normal affect    Signed, Shirlee More, MD  09/01/2018 10:14 AM    [place]

## 2018-09-23 ENCOUNTER — Telehealth: Payer: Self-pay | Admitting: Cardiology

## 2018-09-23 MED ORDER — PROPAFENONE HCL 225 MG PO TABS: 225.0000 mg | ORAL_TABLET | Freq: Two times a day (BID) | ORAL | 1 refills | Status: DC

## 2018-09-23 NOTE — Telephone Encounter (Signed)
Chart reviewed, last office visit shows no change in this medication, refills sent to [facility] in [facility], phoned patient and explained refills sent to pharmacy

## 2018-09-23 NOTE — Telephone Encounter (Signed)
Patient has called stating he requested a refill on his Propafenone but the [facility] in [facility] said that we denied therefill request.  He is calling to find out if he can get the refill and if not, why

## 2018-11-19 DIAGNOSIS — E782 Mixed hyperlipidemia: Secondary | ICD-10-CM | POA: Diagnosis not present

## 2018-11-19 DIAGNOSIS — Z6841 Body Mass Index (BMI) 40.0 and over, adult: Secondary | ICD-10-CM | POA: Diagnosis not present

## 2018-11-19 DIAGNOSIS — M79605 Pain in left leg: Secondary | ICD-10-CM | POA: Diagnosis not present

## 2018-11-19 DIAGNOSIS — I10 Essential (primary) hypertension: Secondary | ICD-10-CM | POA: Diagnosis not present

## 2018-11-19 DIAGNOSIS — Z1331 Encounter for screening for depression: Secondary | ICD-10-CM | POA: Diagnosis not present

## 2018-11-19 DIAGNOSIS — I5020 Unspecified systolic (congestive) heart failure: Secondary | ICD-10-CM | POA: Diagnosis not present

## 2018-11-19 DIAGNOSIS — E1140 Type 2 diabetes mellitus with diabetic neuropathy, unspecified: Secondary | ICD-10-CM | POA: Diagnosis not present

## 2018-11-19 DIAGNOSIS — I4891 Unspecified atrial fibrillation: Secondary | ICD-10-CM | POA: Diagnosis not present

## 2018-12-07 DIAGNOSIS — G4733 Obstructive sleep apnea (adult) (pediatric): Secondary | ICD-10-CM | POA: Diagnosis not present

## 2018-12-13 ENCOUNTER — Telehealth: Payer: Self-pay | Admitting: *Deleted

## 2018-12-13 MED ORDER — TORSEMIDE 20 MG PO TABS: ORAL_TABLET | ORAL | 1 refills | Status: DC

## 2018-12-13 MED ORDER — PROPAFENONE HCL 225 MG PO TABS: 225.0000 mg | ORAL_TABLET | Freq: Two times a day (BID) | ORAL | 1 refills | Status: DC

## 2018-12-13 MED ORDER — LISINOPRIL 10 MG PO TABS: 10.0000 mg | ORAL_TABLET | Freq: Every day | ORAL | 1 refills | Status: DC

## 2018-12-13 MED ORDER — ROSUVASTATIN CALCIUM 20 MG PO TABS: 20.0000 mg | ORAL_TABLET | Freq: Every day | ORAL | 1 refills | Status: DC

## 2018-12-13 MED ORDER — RIVAROXABAN 20 MG PO TABS: 20.0000 mg | ORAL_TABLET | Freq: Every day | ORAL | 1 refills | Status: DC

## 2018-12-13 MED ORDER — LISINOPRIL 10 MG PO TABS: 10.0000 mg | ORAL_TABLET | Freq: Every day | ORAL | 50 refills | Status: DC

## 2018-12-13 NOTE — Telephone Encounter (Signed)
Rx refill sent to pharmacy. Rx's sent to new pharmacy per pt   *STAT* If patient is at the pharmacy, call can be transferred to refill team.   1. Which medications need to be refilled? (please list name of each medication and dose if known) Lisinopril 10 mg, Rythmol 225 mg, Xarelto 20 mg, Crestor 20 mg and Torsemide 20 mg  2. Which pharmacy/location (including street and city if local pharmacy) is medication to be sent to?[facility]   3. Do they need a 30 day or 90 day supply? [place]

## 2018-12-22 DIAGNOSIS — N289 Disorder of kidney and ureter, unspecified: Secondary | ICD-10-CM | POA: Diagnosis not present

## 2018-12-27 DIAGNOSIS — Z1211 Encounter for screening for malignant neoplasm of colon: Secondary | ICD-10-CM | POA: Diagnosis not present

## 2018-12-27 DIAGNOSIS — E785 Hyperlipidemia, unspecified: Secondary | ICD-10-CM | POA: Diagnosis not present

## 2018-12-27 DIAGNOSIS — Z1331 Encounter for screening for depression: Secondary | ICD-10-CM | POA: Diagnosis not present

## 2018-12-27 DIAGNOSIS — Z0000 Encounter for general adult medical examination without abnormal findings: Secondary | ICD-10-CM | POA: Diagnosis not present

## 2018-12-27 DIAGNOSIS — Z9181 History of falling: Secondary | ICD-10-CM | POA: Diagnosis not present

## 2018-12-27 DIAGNOSIS — Z6841 Body Mass Index (BMI) 40.0 and over, adult: Secondary | ICD-10-CM | POA: Diagnosis not present

## 2018-12-27 DIAGNOSIS — Z125 Encounter for screening for malignant neoplasm of prostate: Secondary | ICD-10-CM | POA: Diagnosis not present

## 2019-01-26 DIAGNOSIS — J449 Chronic obstructive pulmonary disease, unspecified: Secondary | ICD-10-CM | POA: Diagnosis not present

## 2019-01-26 DIAGNOSIS — G4733 Obstructive sleep apnea (adult) (pediatric): Secondary | ICD-10-CM | POA: Diagnosis not present

## 2019-01-26 DIAGNOSIS — R0600 Dyspnea, unspecified: Secondary | ICD-10-CM | POA: Diagnosis not present

## 2019-01-28 DIAGNOSIS — E1140 Type 2 diabetes mellitus with diabetic neuropathy, unspecified: Secondary | ICD-10-CM | POA: Diagnosis not present

## 2019-01-28 DIAGNOSIS — Z6839 Body mass index (BMI) 39.0-39.9, adult: Secondary | ICD-10-CM | POA: Diagnosis not present

## 2019-02-21 DIAGNOSIS — J449 Chronic obstructive pulmonary disease, unspecified: Secondary | ICD-10-CM | POA: Diagnosis not present

## 2019-02-21 DIAGNOSIS — G4733 Obstructive sleep apnea (adult) (pediatric): Secondary | ICD-10-CM | POA: Diagnosis not present

## 2019-02-21 DIAGNOSIS — R0600 Dyspnea, unspecified: Secondary | ICD-10-CM | POA: Diagnosis not present

## 2019-03-07 DIAGNOSIS — G4733 Obstructive sleep apnea (adult) (pediatric): Secondary | ICD-10-CM | POA: Diagnosis not present

## 2019-03-30 ENCOUNTER — Ambulatory Visit: Payer: Medicare Other | Admitting: Cardiology

## 2019-04-19 NOTE — Progress Notes (Signed)
Cardiology Office Note:    Date:  04/21/2019   ID:  [name] [name], DOB 08-May-1956, MRN [id number]  PCP:  [name] Bender, PA-C  Cardiologist:  [name] More, MD    Referring MD: [name] Bender, PA-C    ASSESSMENT:    1. Paroxysmal atrial fibrillation (HCC)   2. Chronic anticoagulation   3. High risk medication use   4. Hypertensive heart disease with heart failure ([place])   5. Chronic combined systolic and diastolic heart failure ([place])   6. Mixed hyperlipidemia   7. Uncontrolled type 2 diabetes mellitus with microalbuminuric diabetic nephropathy ([place])    PLAN:    In order of problems listed above:  1. Stable continue his antiarrhythmic drug Rythmol along with anticoagulation.  Prior to leaving the office we will do an EKG to look for evidence of toxicity 2. Continue his anticoagulant moderate stroke risk 3. Continue his antiarrhythmic drug no evidence of toxicity 4. BP at target heart failure compensated continue his ACE inhibitor loop diuretic recheck renal function potassium 5. Stable lipids at target continue his high intensity statin 6. He is now taking cardioprotective medications and is off Actos that may have precipitated heart failure for CG is appropriate with heart failure   Next appointment: 6 months   Medication Adjustments/Labs and Tests Ordered: Current medicines are reviewed at length with the patient today.  Concerns regarding medicines are outlined above.  No orders of the defined types were placed in this encounter.  No orders of the defined types were placed in this encounter.   Chief Complaint  Patient presents with  . Follow-up    History of Present Illness:    [name] [name] is a 63 y.o. male with a hx of paroxysmal atrial fibrillation treated with antiarrhythmic drug initially Multaq and Xarelto,  previous cardiomyopathy with normalization of left ventricular function heart failure and sick sinus syndrome with bradycardia after cardioversion last  seen 09/01/2018 with decompensated heart failure after Actos was discontinued. Compliance with diet, lifestyle and medications: Yes  Overall is doing well unfortunately does not weigh at home he does not check his home blood pressure.  He remains short of breath when he walks uphill incline or use his upper extremities but not bending over no edema orthopnea chest pain or syncope.  At times he has brief rapid heart rhythm it does not last [name] than a few minutes it is infrequent.  He said labs at his PCP office shows a creatinine of 1.87 he has CKD cholesterol 145 HDL 31 LDL 42 and this was in high. Past Medical History:  Diagnosis Date  . Atrial fibrillation ([place])   . Benign essential HTN 07/30/2014  . Bradycardia 01/25/2015  . CHF (congestive heart failure) ([place])   . Chronic systolic CHF (congestive heart failure), NYHA class 3 ([place]) 08/30/2014   Overview:  Global ef 30%  . Diabetes mellitus without complication ([place])   . Dysrhythmia   . Hypertension   . LV dysfunction 04/28/2017  . Mixed hyperlipidemia 07/28/2014  . Paroxysmal atrial fibrillation ([place]) 07/28/2014  . Sinus node dysfunction ([place]) 04/21/2016  . Uncontrolled type 2 diabetes mellitus with microalbuminuric diabetic nephropathy ([place]) 07/28/2014    Past Surgical History:  Procedure Laterality Date  . ELECTROPHYSIOLOGIC STUDY N/A 01/18/2015   Procedure: CARDIOVERSION;  Surgeon: [name] Skains, MD;  Location: [facility] ORS;  Service: Cardiovascular;  Laterality: N/A;    Current Medications: Current Meds  Medication Sig  . albuterol (PROVENTIL HFA;VENTOLIN HFA) 108 (90 Base) MCG/ACT inhaler INHALE  2 PUFFS BY MOUTH EVERY 4 TO 6 HOURS AS NEEDED  . BREO ELLIPTA 100-25 MCG/INH AEPB INHALE 1 PUFF BY MOUTH ONCE DAILY  . FARXIGA 10 MG TABS tablet TAKE 1 TABLET BY MOUTH ONCE DAILY FOR DIABETES  . glipiZIDE (GLUCOTROL) 10 MG tablet Take 10 mg by mouth 2 (two) times daily before a meal.  . lisinopril (ZESTRIL) 10 MG tablet Take 1 tablet (10 mg  total) by mouth daily.  . metFORMIN (GLUCOPHAGE) 1000 MG tablet Take 1,000 mg by mouth 2 (two) times daily with a meal.  . propafenone (RYTHMOL) 225 MG tablet Take 1 tablet (225 mg total) by mouth 2 (two) times daily.  . rivaroxaban (XARELTO) 20 MG TABS tablet Take 1 tablet (20 mg total) by mouth daily with supper.  . rosuvastatin (CRESTOR) 20 MG tablet Take 1 tablet (20 mg total) by mouth daily. This is for cholesterol  . sitaGLIPtin (JANUVIA) 100 MG tablet Take 100 mg by mouth daily.  [name] torsemide (DEMADEX) 20 MG tablet Take 1 tablet three times daily (Patient taking differently: Take 20 mg by mouth 2 (two) times daily. Take 1 tablet three times daily)  . TRULICITY 1.5 0000000 SOPN      Allergies:   Patient has no known allergies.   Social History   Socioeconomic History  . Marital status: Married    Spouse name: Not on file  . Number of children: Not on file  . Years of education: Not on file  . Highest education level: Not on file  Occupational History  . Not on file  Social Needs  . Financial resource strain: Not on file  . Food insecurity    Worry: Not on file    Inability: Not on file  . Transportation needs    Medical: Not on file    Non-medical: Not on file  Tobacco Use  . Smoking status: Former Smoker    Quit date: 01/17/2002    Years since quitting: 17.2  . Smokeless tobacco: Never Used  Substance and Sexual Activity  . Alcohol use: No  . Drug use: Yes    Frequency: 1.0 times per week    Types: Marijuana  . Sexual activity: Not on file  Lifestyle  . Physical activity    Days per week: Not on file    Minutes per session: Not on file  . Stress: Not on file  Relationships  . Social Herbalist on phone: Not on file    Gets together: Not on file    Attends religious service: Not on file    Active member of club or organization: Not on file    Attends meetings of clubs or organizations: Not on file    Relationship status: Not on file  Other Topics  Concern  . Not on file  Social History Narrative  . Not on file     Family History: The patient's family history includes CAD in his brother and mother; Drug abuse in his brother; Lung cancer in his father. ROS:   Please see the history of present illness.    All other systems reviewed and are negative.  EKGs/Labs/Other Studies Reviewed:    The following studies were reviewed today:  EKG:  EKG ordered today and personally reviewed.  The ekg ordered today demonstrates sinus rhythm and is normal   Physical Exam:    VS:  BP (!) 102/58 (BP Location: Right Arm, Patient Position: Sitting, Cuff Size: Large)   Pulse 89  Ht 5\' 10"  (1.778 m)   Wt 269 lb (122 kg)   SpO2 97%   BMI 38.60 kg/m     Wt Readings from Last 3 Encounters:  04/21/19 269 lb (122 kg)  09/01/18 298 lb 9.6 oz (135.4 kg)  05/26/18 298 lb (135.2 kg)     GEN:  Well nourished, well developed in no acute distress HEENT: Normal NECK: No JVD; No carotid bruits LYMPHATICS: No lymphadenopathy CARDIAC: RRR, no murmurs, rubs, gallops RESPIRATORY:  Clear to auscultation without rales, wheezing or rhonchi  ABDOMEN: Soft, non-tender, non-distended MUSCULOSKELETAL:  No edema; No deformity  SKIN: Warm and dry NEUROLOGIC:  Alert and oriented x 3 PSYCHIATRIC:  Normal affect    Signed, [name] More, MD  04/21/2019 9:27 AM    [place]

## 2019-04-21 ENCOUNTER — Ambulatory Visit (INDEPENDENT_AMBULATORY_CARE_PROVIDER_SITE_OTHER): Payer: Medicare HMO | Admitting: Cardiology

## 2019-04-21 ENCOUNTER — Encounter: Payer: Self-pay | Admitting: Cardiology

## 2019-04-21 ENCOUNTER — Other Ambulatory Visit: Payer: Self-pay

## 2019-04-21 VITALS — BP 102/58 | HR 89 | Ht 70.0 in | Wt 269.0 lb

## 2019-04-21 DIAGNOSIS — I5042 Chronic combined systolic (congestive) and diastolic (congestive) heart failure: Secondary | ICD-10-CM | POA: Diagnosis not present

## 2019-04-21 DIAGNOSIS — Z79899 Other long term (current) drug therapy: Secondary | ICD-10-CM | POA: Diagnosis not present

## 2019-04-21 DIAGNOSIS — E1121 Type 2 diabetes mellitus with diabetic nephropathy: Secondary | ICD-10-CM | POA: Diagnosis not present

## 2019-04-21 DIAGNOSIS — IMO0002 Reserved for concepts with insufficient information to code with codable children: Secondary | ICD-10-CM

## 2019-04-21 DIAGNOSIS — I110 Hypertensive heart disease with heart failure: Secondary | ICD-10-CM

## 2019-04-21 DIAGNOSIS — Z7901 Long term (current) use of anticoagulants: Secondary | ICD-10-CM | POA: Diagnosis not present

## 2019-04-21 DIAGNOSIS — E782 Mixed hyperlipidemia: Secondary | ICD-10-CM | POA: Diagnosis not present

## 2019-04-21 DIAGNOSIS — I480 Paroxysmal atrial fibrillation: Secondary | ICD-10-CM

## 2019-04-21 DIAGNOSIS — E1165 Type 2 diabetes mellitus with hyperglycemia: Secondary | ICD-10-CM

## 2019-04-21 LAB — BASIC METABOLIC PANEL
BUN/Creatinine Ratio: 23 (ref 10–24)
BUN: 47 mg/dL — ABNORMAL HIGH (ref 8–27)
CO2: 21 mmol/L (ref 20–29)
Calcium: 9.9 mg/dL (ref 8.6–10.2)
Chloride: 99 mmol/L (ref 96–106)
Creatinine, Ser: 2.05 mg/dL — ABNORMAL HIGH (ref 0.76–1.27)
GFR calc Af Amer: 38 mL/min/{1.73_m2} — ABNORMAL LOW (ref >59)
GFR calc non Af Amer: 33 mL/min/{1.73_m2} — ABNORMAL LOW (ref >59)
Glucose: 147 mg/dL — ABNORMAL HIGH (ref 65–99)
Potassium: 4.7 mmol/L (ref 3.5–5.2)
Sodium: 139 mmol/L (ref 134–144)

## 2019-04-21 LAB — PRO B NATRIURETIC PEPTIDE: NT-Pro BNP: 415 pg/mL — ABNORMAL HIGH (ref 0–210)

## 2019-04-21 NOTE — Patient Instructions (Signed)
Medication Instructions:  Your physician recommends that you continue on your current medications as directed. Please refer to the Current Medication list given to you today.  *If you need a refill on your cardiac medications before your next appointment, please call your pharmacy*  Lab Work: Your physician recommends that you return for lab work today: BMP, [place].   If you have labs (blood work) drawn today and your tests are completely normal, you will receive your results only by: [name] MyChart Message (if you have MyChart) OR . A paper copy in the mail If you have any lab test that is abnormal or we need to change your treatment, we will call you to review the results.  Testing/Procedures: You had an EKG today.   Follow-Up: At [facility], you and your health needs are our priority.  As part of our continuing mission to provide you with exceptional heart care, we have created designated Provider Care Teams.  These Care Teams include your primary Cardiologist (physician) and Advanced Practice Providers (APPs -  Physician Assistants and Nurse Practitioners) who all work together to provide you with the care you need, when you need it.  Your next appointment:   6 months  The format for your next appointment:   In Person  Provider:   Shirlee More, MD

## 2019-04-22 ENCOUNTER — Telehealth: Payer: Self-pay

## 2019-04-22 DIAGNOSIS — E1121 Type 2 diabetes mellitus with diabetic nephropathy: Secondary | ICD-10-CM

## 2019-04-22 DIAGNOSIS — IMO0002 Reserved for concepts with insufficient information to code with codable children: Secondary | ICD-10-CM

## 2019-04-22 DIAGNOSIS — I110 Hypertensive heart disease with heart failure: Secondary | ICD-10-CM

## 2019-04-22 NOTE — Telephone Encounter (Signed)
Patient informed of results with worsened renal function.  Patient advised to recheck BMP in 1 week.  Patient agreed to plan and verbalized understanding.  No further questions.

## 2019-04-25 ENCOUNTER — Other Ambulatory Visit: Payer: Self-pay | Admitting: Cardiology

## 2019-04-28 DIAGNOSIS — E1165 Type 2 diabetes mellitus with hyperglycemia: Secondary | ICD-10-CM | POA: Diagnosis not present

## 2019-04-28 DIAGNOSIS — I110 Hypertensive heart disease with heart failure: Secondary | ICD-10-CM | POA: Diagnosis not present

## 2019-04-28 DIAGNOSIS — E1121 Type 2 diabetes mellitus with diabetic nephropathy: Secondary | ICD-10-CM | POA: Diagnosis not present

## 2019-04-29 ENCOUNTER — Other Ambulatory Visit: Payer: Self-pay

## 2019-04-29 DIAGNOSIS — IMO0002 Reserved for concepts with insufficient information to code with codable children: Secondary | ICD-10-CM

## 2019-04-29 DIAGNOSIS — E1121 Type 2 diabetes mellitus with diabetic nephropathy: Secondary | ICD-10-CM

## 2019-04-29 DIAGNOSIS — I110 Hypertensive heart disease with heart failure: Secondary | ICD-10-CM

## 2019-04-29 LAB — BASIC METABOLIC PANEL
BUN/Creatinine Ratio: 22 (ref 10–24)
BUN: 46 mg/dL — ABNORMAL HIGH (ref 8–27)
CO2: 20 mmol/L (ref 20–29)
Calcium: 9.8 mg/dL (ref 8.6–10.2)
Chloride: 97 mmol/L (ref 96–106)
Creatinine, Ser: 2.1 mg/dL — ABNORMAL HIGH (ref 0.76–1.27)
GFR calc Af Amer: 37 mL/min/{1.73_m2} — ABNORMAL LOW (ref >59)
GFR calc non Af Amer: 32 mL/min/{1.73_m2} — ABNORMAL LOW (ref >59)
Glucose: 236 mg/dL — ABNORMAL HIGH (ref 65–99)
Potassium: 4.8 mmol/L (ref 3.5–5.2)
Sodium: 137 mmol/L (ref 134–144)

## 2019-05-02 DIAGNOSIS — I5020 Unspecified systolic (congestive) heart failure: Secondary | ICD-10-CM | POA: Diagnosis not present

## 2019-05-02 DIAGNOSIS — Z6837 Body mass index (BMI) 37.0-37.9, adult: Secondary | ICD-10-CM | POA: Diagnosis not present

## 2019-05-02 DIAGNOSIS — E1140 Type 2 diabetes mellitus with diabetic neuropathy, unspecified: Secondary | ICD-10-CM | POA: Diagnosis not present

## 2019-05-02 DIAGNOSIS — E782 Mixed hyperlipidemia: Secondary | ICD-10-CM | POA: Diagnosis not present

## 2019-05-02 DIAGNOSIS — I10 Essential (primary) hypertension: Secondary | ICD-10-CM | POA: Diagnosis not present

## 2019-05-02 DIAGNOSIS — I4891 Unspecified atrial fibrillation: Secondary | ICD-10-CM | POA: Diagnosis not present

## 2019-06-01 DIAGNOSIS — N1832 Chronic kidney disease, stage 3b: Secondary | ICD-10-CM | POA: Diagnosis not present

## 2019-06-01 DIAGNOSIS — E1122 Type 2 diabetes mellitus with diabetic chronic kidney disease: Secondary | ICD-10-CM | POA: Diagnosis not present

## 2019-06-01 DIAGNOSIS — I129 Hypertensive chronic kidney disease with stage 1 through stage 4 chronic kidney disease, or unspecified chronic kidney disease: Secondary | ICD-10-CM | POA: Diagnosis not present

## 2019-06-01 DIAGNOSIS — R809 Proteinuria, unspecified: Secondary | ICD-10-CM | POA: Diagnosis not present

## 2019-06-01 DIAGNOSIS — G4733 Obstructive sleep apnea (adult) (pediatric): Secondary | ICD-10-CM | POA: Diagnosis not present

## 2019-06-01 DIAGNOSIS — N184 Chronic kidney disease, stage 4 (severe): Secondary | ICD-10-CM | POA: Diagnosis not present

## 2019-06-01 DIAGNOSIS — I4891 Unspecified atrial fibrillation: Secondary | ICD-10-CM | POA: Diagnosis not present

## 2019-06-01 DIAGNOSIS — E785 Hyperlipidemia, unspecified: Secondary | ICD-10-CM | POA: Diagnosis not present

## 2019-06-01 DIAGNOSIS — I509 Heart failure, unspecified: Secondary | ICD-10-CM | POA: Diagnosis not present

## 2019-06-07 ENCOUNTER — Other Ambulatory Visit: Payer: Self-pay | Admitting: Nephrology

## 2019-06-07 DIAGNOSIS — G4733 Obstructive sleep apnea (adult) (pediatric): Secondary | ICD-10-CM | POA: Diagnosis not present

## 2019-06-07 DIAGNOSIS — N184 Chronic kidney disease, stage 4 (severe): Secondary | ICD-10-CM

## 2019-06-15 DIAGNOSIS — I129 Hypertensive chronic kidney disease with stage 1 through stage 4 chronic kidney disease, or unspecified chronic kidney disease: Secondary | ICD-10-CM | POA: Diagnosis not present

## 2019-06-22 ENCOUNTER — Ambulatory Visit
Admission: RE | Admit: 2019-06-22 | Discharge: 2019-06-22 | Disposition: A | Discharge status: Home / Self Care | Payer: Medicare HMO | Source: Ambulatory Visit | Attending: Nephrology | Admitting: Nephrology

## 2019-06-22 ENCOUNTER — Other Ambulatory Visit: Payer: Self-pay | Admitting: Cardiology

## 2019-06-22 DIAGNOSIS — N189 Chronic kidney disease, unspecified: Secondary | ICD-10-CM | POA: Diagnosis not present

## 2019-06-22 DIAGNOSIS — N184 Chronic kidney disease, stage 4 (severe): Secondary | ICD-10-CM

## 2019-07-12 ENCOUNTER — Other Ambulatory Visit: Payer: Self-pay | Admitting: Cardiology

## 2019-07-18 DIAGNOSIS — G4733 Obstructive sleep apnea (adult) (pediatric): Secondary | ICD-10-CM | POA: Diagnosis not present

## 2019-07-18 DIAGNOSIS — R0600 Dyspnea, unspecified: Secondary | ICD-10-CM | POA: Diagnosis not present

## 2019-07-18 DIAGNOSIS — E668 Other obesity: Secondary | ICD-10-CM | POA: Diagnosis not present

## 2019-07-18 DIAGNOSIS — J449 Chronic obstructive pulmonary disease, unspecified: Secondary | ICD-10-CM | POA: Diagnosis not present

## 2019-08-02 DIAGNOSIS — E782 Mixed hyperlipidemia: Secondary | ICD-10-CM | POA: Diagnosis not present

## 2019-08-02 DIAGNOSIS — Z125 Encounter for screening for malignant neoplasm of prostate: Secondary | ICD-10-CM | POA: Diagnosis not present

## 2019-08-02 DIAGNOSIS — E1140 Type 2 diabetes mellitus with diabetic neuropathy, unspecified: Secondary | ICD-10-CM | POA: Diagnosis not present

## 2019-08-02 DIAGNOSIS — I4891 Unspecified atrial fibrillation: Secondary | ICD-10-CM | POA: Diagnosis not present

## 2019-08-02 DIAGNOSIS — I5020 Unspecified systolic (congestive) heart failure: Secondary | ICD-10-CM | POA: Diagnosis not present

## 2019-08-02 DIAGNOSIS — Z6837 Body mass index (BMI) 37.0-37.9, adult: Secondary | ICD-10-CM | POA: Diagnosis not present

## 2019-08-02 DIAGNOSIS — I10 Essential (primary) hypertension: Secondary | ICD-10-CM | POA: Diagnosis not present

## 2019-08-23 DIAGNOSIS — J449 Chronic obstructive pulmonary disease, unspecified: Secondary | ICD-10-CM | POA: Diagnosis not present

## 2019-08-23 DIAGNOSIS — R0600 Dyspnea, unspecified: Secondary | ICD-10-CM | POA: Diagnosis not present

## 2019-08-23 DIAGNOSIS — E668 Other obesity: Secondary | ICD-10-CM | POA: Diagnosis not present

## 2019-08-23 DIAGNOSIS — G4733 Obstructive sleep apnea (adult) (pediatric): Secondary | ICD-10-CM | POA: Diagnosis not present

## 2019-09-07 ENCOUNTER — Other Ambulatory Visit: Payer: Self-pay | Admitting: Cardiology

## 2019-09-07 DIAGNOSIS — I509 Heart failure, unspecified: Secondary | ICD-10-CM | POA: Diagnosis not present

## 2019-09-07 DIAGNOSIS — I4891 Unspecified atrial fibrillation: Secondary | ICD-10-CM | POA: Diagnosis not present

## 2019-09-07 DIAGNOSIS — E1122 Type 2 diabetes mellitus with diabetic chronic kidney disease: Secondary | ICD-10-CM | POA: Diagnosis not present

## 2019-09-07 DIAGNOSIS — G4733 Obstructive sleep apnea (adult) (pediatric): Secondary | ICD-10-CM | POA: Diagnosis not present

## 2019-09-07 DIAGNOSIS — I129 Hypertensive chronic kidney disease with stage 1 through stage 4 chronic kidney disease, or unspecified chronic kidney disease: Secondary | ICD-10-CM | POA: Diagnosis not present

## 2019-09-07 DIAGNOSIS — N184 Chronic kidney disease, stage 4 (severe): Secondary | ICD-10-CM | POA: Diagnosis not present

## 2019-09-07 DIAGNOSIS — R809 Proteinuria, unspecified: Secondary | ICD-10-CM | POA: Diagnosis not present

## 2019-09-07 DIAGNOSIS — E785 Hyperlipidemia, unspecified: Secondary | ICD-10-CM | POA: Diagnosis not present

## 2019-09-14 DIAGNOSIS — G4733 Obstructive sleep apnea (adult) (pediatric): Secondary | ICD-10-CM | POA: Diagnosis not present

## 2019-10-17 NOTE — Progress Notes (Signed)
Cardiology Office Note:    Date:  10/18/2019   ID:  [name] [name], DOB 03-26-1956, MRN [id number]  PCP:  Cyndi Bender, PA-C  Cardiologist:  Shirlee More, MD    Referring MD: Cyndi Bender, PA-C    ASSESSMENT:    1. Paroxysmal atrial fibrillation (HCC)   2. Chronic anticoagulation   3. High risk medication use   4. Hypertensive heart disease with heart failure ([place])    PLAN:    In order of problems listed above:  1. Stable maintaining sinus rhythm continue low-dose propafenone along with his anticoagulant.  Recheck renal function and may require reduced dose 2. Continue propafenone anticoagulant he has had no bleeding complications 3. BP at target heart failure compensated he takes a lower dose of diuretic and ACE inhibitor recheck his renal function with diabetes and CKD.   Next appointment: 6 months   Medication Adjustments/Labs and Tests Ordered: Current medicines are reviewed at length with the patient today.  Concerns regarding medicines are outlined above.  No orders of the defined types were placed in this encounter.  No orders of the defined types were placed in this encounter.   Chief Complaint  Patient presents with  . Follow-up  . Atrial Fibrillation  . Congestive Heart Failure    History of Present Illness:    [name] is a 64 y.o. male with a hx of paroxysmal atrial fibrillation treated with antiarrhythmic drug initially Multaq and Xarelto,  previous cardiomyopathy with normalization of left ventricular function heart failure and sick sinus syndrome with bradycardia after cardioversion  and heart failure with Actos He was last seen 04/21/2019 maintaining sinus rhythm with propafenone. Compliance with diet, lifestyle and medications: Yes  My memory is correct and last seen by his PCP had brief atrial fibrillation captured and I saw that EKG faxed to the office but was not placed in epic.  Other than that he has had no episodes he takes a lower dose  of diuretic lower dose of ACE inhibitor and has CKD with a recent creatinine of 1.9.  Other labs 08/02/2019 show ideal lipids cholesterol 132 HDL 37 LDL 52 A1c above target at 7.9%.  He is pleased with the quality of his life he feels good he is having no shortness of breath chest pain palpitation or syncope. Past Medical History:  Diagnosis Date  . Atrial fibrillation ([place])   . Benign essential HTN 07/30/2014  . Bradycardia 01/25/2015  . CHF (congestive heart failure) ([place])   . Chronic anticoagulation 02/25/2018  . Chronic systolic CHF (congestive heart failure), NYHA class 3 ([place]) 08/30/2014   Overview:  Global ef 30%  . Class 3 severe obesity in adult ([place]) 04/28/2017  . Diabetes mellitus without complication ([place])   . Diabetic neuropathy ([place]) 07/28/2014  . Dysrhythmia   . High risk medication use 05/26/2018  . Hypertension   . Hypertensive heart disease with heart failure ([place]) 07/30/2014  . LV dysfunction 04/28/2017  . Mixed hyperlipidemia 07/28/2014  . Paroxysmal atrial fibrillation ([place]) 07/28/2014  . Sinus node dysfunction ([place]) 04/21/2016  . Uncontrolled type 2 diabetes mellitus with microalbuminuric diabetic nephropathy ([place]) 07/28/2014    Past Surgical History:  Procedure Laterality Date  . ELECTROPHYSIOLOGIC STUDY N/A 01/18/2015   Procedure: CARDIOVERSION;  Surgeon: [name], MD;  Location: [facility] ORS;  Service: Cardiovascular;  Laterality: N/A;    Current Medications: Current Meds  Medication Sig  . albuterol (PROVENTIL HFA;VENTOLIN HFA) 108 (90 Base) MCG/ACT inhaler INHALE 2 PUFFS BY MOUTH EVERY  4 TO 6 HOURS AS NEEDED  . atorvastatin (LIPITOR) 40 MG tablet Take 40 mg by mouth daily.  [name] BREO ELLIPTA 100-25 MCG/INH AEPB INHALE 1 PUFF BY MOUTH ONCE DAILY  . FARXIGA 10 MG TABS tablet TAKE 1 TABLET BY MOUTH ONCE DAILY FOR DIABETES  . glipiZIDE (GLUCOTROL) 10 MG tablet Take 10 mg by mouth 2 (two) times daily before a meal.  . lisinopril (ZESTRIL) 10 MG tablet TAKE 1 TABLET EVERY DAY  (Patient taking differently: Take 5 mg by mouth daily. )  . metFORMIN (GLUCOPHAGE) 1000 MG tablet Take 1,000 mg by mouth 2 (two) times daily with a meal.  . propafenone (RYTHMOL) 225 MG tablet TAKE 1 TABLET TWICE DAILY  . rosuvastatin (CRESTOR) 20 MG tablet TAKE 1 TABLET EVERY DAY FOR CHOLESTEROL  . sitaGLIPtin (JANUVIA) 100 MG tablet Take 100 mg by mouth daily.  [name] torsemide (DEMADEX) 20 MG tablet TAKE 1 TABLET THREE TIMES DAILY (Patient taking differently: Take 20 mg by mouth 2 (two) times daily. Take 1 tablet three times daily)  . TRULICITY 1.5 YT/0.3TW SOPN   . XARELTO 20 MG TABS tablet TAKE 1 TABLET EVERY DAY WITH SUPPER     Allergies:   Patient has no known allergies.   Social History   Socioeconomic History  . Marital status: Married    Spouse name: Not on file  . Number of children: Not on file  . Years of education: Not on file  . Highest education level: Not on file  Occupational History  . Not on file  Tobacco Use  . Smoking status: Former Smoker    Quit date: 01/17/2002    Years since quitting: 17.7  . Smokeless tobacco: Never Used  Substance and Sexual Activity  . Alcohol use: No  . Drug use: Yes    Frequency: 1.0 times per week    Types: Marijuana  . Sexual activity: Not on file  Other Topics Concern  . Not on file  Social History Narrative  . Not on file   Social Determinants of Health   Financial Resource Strain:   . Difficulty of Paying Living Expenses:   Food Insecurity:   . Worried About Charity fundraiser in the Last Year:   . Arboriculturist in the Last Year:   Transportation Needs:   . Film/video editor (Medical):   [name] Lack of Transportation (Non-Medical):   Physical Activity:   . Days of Exercise per Week:   . Minutes of Exercise per Session:   Stress:   . Feeling of Stress :   Social Connections:   . Frequency of Communication with Friends and Family:   . Frequency of Social Gatherings with Friends and Family:   . Attends Religious  Services:   . Active Member of Clubs or Organizations:   . Attends Archivist Meetings:   [name] Marital Status:      Family History: The patient's family history includes CAD in his brother and mother; Drug abuse in his brother; Lung cancer in his father. ROS:   Please see the history of present illness.    All other systems reviewed and are negative.  EKGs/Labs/Other Studies Reviewed:    The following studies were reviewed today:  EKG:  EKG ordered today and personally reviewed.  The ekg ordered today demonstrates sinus rhythm and remains normal  Recent Labs: 04/21/2019: NT-Pro BNP 415 04/28/2019: BUN 46; Creatinine, Ser 2.10; Potassium 4.8; Sodium 137  Recent Lipid Panel No results  found for: CHOL, TRIG, HDL, CHOLHDL, VLDL, LDLCALC, LDLDIRECT  Physical Exam:    VS:  BP 104/60 (BP Location: Right Arm, Patient Position: Sitting, Cuff Size: Normal)   Pulse 86   Ht 5\' 10"  (1.778 m)   Wt 264 lb (119.7 kg)   SpO2 97%   BMI 37.88 kg/m     Wt Readings from Last 3 Encounters:  10/18/19 264 lb (119.7 kg)  04/21/19 269 lb (122 kg)  09/01/18 298 lb 9.6 oz (135.4 kg)     GEN:  Well nourished, well developed in no acute distress HEENT: Normal NECK: No JVD; No carotid bruits LYMPHATICS: No lymphadenopathy CARDIAC: ]RRR, no murmurs, rubs, gallops RESPIRATORY:  Clear to auscultation without rales, wheezing or rhonchi  ABDOMEN: Soft, non-tender, non-distended MUSCULOSKELETAL:  No edema; No deformity  SKIN: Warm and dry NEUROLOGIC:  Alert and oriented x 3 PSYCHIATRIC:  Normal affect    Signed, Shirlee More, MD  10/18/2019 9:48 AM    [place]

## 2019-10-18 ENCOUNTER — Ambulatory Visit (INDEPENDENT_AMBULATORY_CARE_PROVIDER_SITE_OTHER): Payer: Medicare HMO | Admitting: Cardiology

## 2019-10-18 ENCOUNTER — Other Ambulatory Visit: Payer: Self-pay

## 2019-10-18 VITALS — BP 104/60 | HR 86 | Ht 70.0 in | Wt 264.0 lb

## 2019-10-18 DIAGNOSIS — I110 Hypertensive heart disease with heart failure: Secondary | ICD-10-CM | POA: Diagnosis not present

## 2019-10-18 DIAGNOSIS — Z7901 Long term (current) use of anticoagulants: Secondary | ICD-10-CM | POA: Diagnosis not present

## 2019-10-18 DIAGNOSIS — I480 Paroxysmal atrial fibrillation: Secondary | ICD-10-CM

## 2019-10-18 DIAGNOSIS — Z79899 Other long term (current) drug therapy: Secondary | ICD-10-CM | POA: Diagnosis not present

## 2019-10-18 NOTE — Patient Instructions (Addendum)
Medication Instructions:  Your physician recommends that you continue on your current medications as directed. Please refer to the Current Medication list given to you today.  *If you need a refill on your cardiac medications before your next appointment, please call your pharmacy*   Lab Work: Your physician recommends that you return for lab work in: TODAY BMP, ProBNP If you have labs (blood work) drawn today and your tests are completely normal, you will receive your results only by: . MyChart Message (if you have MyChart) OR . A paper copy in the mail If you have any lab test that is abnormal or we need to change your treatment, we will call you to review the results.   Testing/Procedures: None   Follow-Up: At [facility], you and your health needs are our priority.  As part of our continuing mission to provide you with exceptional heart care, we have created designated Provider Care Teams.  These Care Teams include your primary Cardiologist (physician) and Advanced Practice Providers (APPs -  Physician Assistants and Nurse Practitioners) who all work together to provide you with the care you need, when you need it.  We recommend signing up for the patient portal called "MyChart".  Sign up information is provided on this After Visit Summary.  MyChart is used to connect with patients for Virtual Visits (Telemedicine).  Patients are able to view lab/test results, encounter notes, upcoming appointments, etc.  Non-urgent messages can be sent to your provider as well.   To learn more about what you can do with MyChart, go to [email or web address]://[email or web address].com.    Your next appointment:   6 month(s)  The format for your next appointment:   In Person  Provider:   Brian Munley, MD   Other Instructions   

## 2019-10-19 LAB — BASIC METABOLIC PANEL
BUN/Creatinine Ratio: 17 (ref 10–24)
BUN: 32 mg/dL — ABNORMAL HIGH (ref 8–27)
CO2: 21 mmol/L (ref 20–29)
Calcium: 9.6 mg/dL (ref 8.6–10.2)
Chloride: 96 mmol/L (ref 96–106)
Creatinine, Ser: 1.89 mg/dL — ABNORMAL HIGH (ref 0.76–1.27)
GFR calc Af Amer: 42 mL/min/{1.73_m2} — ABNORMAL LOW (ref >59)
GFR calc non Af Amer: 36 mL/min/{1.73_m2} — ABNORMAL LOW (ref >59)
Glucose: 142 mg/dL — ABNORMAL HIGH (ref 65–99)
Potassium: 4.3 mmol/L (ref 3.5–5.2)
Sodium: 136 mmol/L (ref 134–144)

## 2019-10-19 LAB — PRO B NATRIURETIC PEPTIDE: NT-Pro BNP: 83 pg/mL (ref 0–376)

## 2019-10-20 ENCOUNTER — Telehealth: Payer: Self-pay

## 2019-10-20 NOTE — Telephone Encounter (Signed)
Tried calling patient. No answer and voicemail is full so I can not leave a message at this time.  

## 2019-10-20 NOTE — Telephone Encounter (Signed)
-----   Message from Richardo Priest, MD sent at 10/20/2019  7:43 AM EDT ----- Normal or stable result  Good results better than before I think he is doing much better and no change in medications

## 2019-10-21 ENCOUNTER — Telehealth: Payer: Self-pay

## 2019-10-21 NOTE — Telephone Encounter (Signed)
-----   Message from Richardo Priest, MD sent at 10/20/2019  7:43 AM EDT ----- Normal or stable result  Good results better than before I think he is doing much better and no change in medications

## 2019-10-21 NOTE — Telephone Encounter (Signed)
The patient has been notified of the result and verbalized understanding.  All questions (if any) were answered. Wilma Flavin, RN 10/21/2019 4:11 PM

## 2019-10-24 NOTE — Telephone Encounter (Signed)
Spoke with patient regarding results and recommendation.  Patient verbalizes understanding and is agreeable to plan of care. Advised patient to call back with any issues or concerns.  

## 2019-11-02 DIAGNOSIS — Z6837 Body mass index (BMI) 37.0-37.9, adult: Secondary | ICD-10-CM | POA: Diagnosis not present

## 2019-11-02 DIAGNOSIS — E782 Mixed hyperlipidemia: Secondary | ICD-10-CM | POA: Diagnosis not present

## 2019-11-02 DIAGNOSIS — I4891 Unspecified atrial fibrillation: Secondary | ICD-10-CM | POA: Diagnosis not present

## 2019-11-02 DIAGNOSIS — J449 Chronic obstructive pulmonary disease, unspecified: Secondary | ICD-10-CM | POA: Diagnosis not present

## 2019-11-02 DIAGNOSIS — N183 Chronic kidney disease, stage 3 unspecified: Secondary | ICD-10-CM | POA: Diagnosis not present

## 2019-11-02 DIAGNOSIS — E1140 Type 2 diabetes mellitus with diabetic neuropathy, unspecified: Secondary | ICD-10-CM | POA: Diagnosis not present

## 2019-11-02 DIAGNOSIS — I5020 Unspecified systolic (congestive) heart failure: Secondary | ICD-10-CM | POA: Diagnosis not present

## 2019-11-02 DIAGNOSIS — I10 Essential (primary) hypertension: Secondary | ICD-10-CM | POA: Diagnosis not present

## 2019-11-23 DIAGNOSIS — E668 Other obesity: Secondary | ICD-10-CM | POA: Diagnosis not present

## 2019-11-23 DIAGNOSIS — G4733 Obstructive sleep apnea (adult) (pediatric): Secondary | ICD-10-CM | POA: Diagnosis not present

## 2019-11-23 DIAGNOSIS — R0600 Dyspnea, unspecified: Secondary | ICD-10-CM | POA: Diagnosis not present

## 2019-11-23 DIAGNOSIS — J449 Chronic obstructive pulmonary disease, unspecified: Secondary | ICD-10-CM | POA: Diagnosis not present

## 2019-12-14 DIAGNOSIS — G4733 Obstructive sleep apnea (adult) (pediatric): Secondary | ICD-10-CM | POA: Diagnosis not present

## 2019-12-27 ENCOUNTER — Other Ambulatory Visit: Payer: Self-pay

## 2019-12-27 ENCOUNTER — Telehealth: Payer: Self-pay | Admitting: Cardiology

## 2019-12-27 MED ORDER — PROPAFENONE HCL 225 MG PO TABS: 225.0000 mg | ORAL_TABLET | Freq: Two times a day (BID) | ORAL | 3 refills | Status: DC

## 2019-12-27 NOTE — Addendum Note (Signed)
Addended by: Resa Miner I on: 12/27/2019 11:38 AM   Modules accepted: Orders

## 2019-12-27 NOTE — Telephone Encounter (Signed)
Spoke to patient just now and let him know that this is not something that we keep samples of in the office. He verbalizes understanding and thanks me for the call back.    Encouraged patient to call back with any questions or concerns.

## 2019-12-27 NOTE — Telephone Encounter (Signed)
*  STAT* If patient is at the pharmacy, call can be transferred to refill team.   1. Which medications need to be refilled? (please list name of each medication and dose if known)  propafenone (RYTHMOL) 225 MG tablet  2. Which pharmacy/location (including street and city if local pharmacy) is medication to be sent to? [place], [place]  3. Do they need a 30 day or 90 day supply? 90 day supply   Patient calling the office for samples of medication:   1.  What medication and dosage are you requesting samples for? propafenone (RYTHMOL) 225 MG tablet  2.  Are you currently out of this medication? Yes, been out of medication for a month

## 2020-01-02 DIAGNOSIS — Z0000 Encounter for general adult medical examination without abnormal findings: Secondary | ICD-10-CM | POA: Diagnosis not present

## 2020-01-02 DIAGNOSIS — Z1331 Encounter for screening for depression: Secondary | ICD-10-CM | POA: Diagnosis not present

## 2020-01-02 DIAGNOSIS — E785 Hyperlipidemia, unspecified: Secondary | ICD-10-CM | POA: Diagnosis not present

## 2020-01-02 DIAGNOSIS — Z6837 Body mass index (BMI) 37.0-37.9, adult: Secondary | ICD-10-CM | POA: Diagnosis not present

## 2020-01-06 LAB — EXTERNAL GENERIC LAB PROCEDURE

## 2020-01-06 LAB — COLOGUARD

## 2020-01-17 DIAGNOSIS — Z1212 Encounter for screening for malignant neoplasm of rectum: Secondary | ICD-10-CM | POA: Diagnosis not present

## 2020-01-17 DIAGNOSIS — Z1211 Encounter for screening for malignant neoplasm of colon: Secondary | ICD-10-CM | POA: Diagnosis not present

## 2020-01-23 LAB — COLOGUARD: COLOGUARD: NEGATIVE

## 2020-01-23 LAB — EXTERNAL GENERIC LAB PROCEDURE: COLOGUARD: NEGATIVE

## 2020-01-24 DIAGNOSIS — E785 Hyperlipidemia, unspecified: Secondary | ICD-10-CM | POA: Diagnosis not present

## 2020-01-24 DIAGNOSIS — N2581 Secondary hyperparathyroidism of renal origin: Secondary | ICD-10-CM | POA: Diagnosis not present

## 2020-01-24 DIAGNOSIS — N184 Chronic kidney disease, stage 4 (severe): Secondary | ICD-10-CM | POA: Diagnosis not present

## 2020-01-24 DIAGNOSIS — E1122 Type 2 diabetes mellitus with diabetic chronic kidney disease: Secondary | ICD-10-CM | POA: Diagnosis not present

## 2020-01-24 DIAGNOSIS — R809 Proteinuria, unspecified: Secondary | ICD-10-CM | POA: Diagnosis not present

## 2020-01-24 DIAGNOSIS — I509 Heart failure, unspecified: Secondary | ICD-10-CM | POA: Diagnosis not present

## 2020-02-03 DIAGNOSIS — E782 Mixed hyperlipidemia: Secondary | ICD-10-CM | POA: Diagnosis not present

## 2020-02-03 DIAGNOSIS — Z6837 Body mass index (BMI) 37.0-37.9, adult: Secondary | ICD-10-CM | POA: Diagnosis not present

## 2020-02-03 DIAGNOSIS — E1140 Type 2 diabetes mellitus with diabetic neuropathy, unspecified: Secondary | ICD-10-CM | POA: Diagnosis not present

## 2020-02-03 DIAGNOSIS — N183 Chronic kidney disease, stage 3 unspecified: Secondary | ICD-10-CM | POA: Diagnosis not present

## 2020-02-03 DIAGNOSIS — I5020 Unspecified systolic (congestive) heart failure: Secondary | ICD-10-CM | POA: Diagnosis not present

## 2020-02-03 DIAGNOSIS — I10 Essential (primary) hypertension: Secondary | ICD-10-CM | POA: Diagnosis not present

## 2020-02-03 DIAGNOSIS — I4891 Unspecified atrial fibrillation: Secondary | ICD-10-CM | POA: Diagnosis not present

## 2020-02-28 DIAGNOSIS — E668 Other obesity: Secondary | ICD-10-CM | POA: Diagnosis not present

## 2020-02-28 DIAGNOSIS — J449 Chronic obstructive pulmonary disease, unspecified: Secondary | ICD-10-CM | POA: Diagnosis not present

## 2020-02-28 DIAGNOSIS — R0600 Dyspnea, unspecified: Secondary | ICD-10-CM | POA: Diagnosis not present

## 2020-02-28 DIAGNOSIS — G4733 Obstructive sleep apnea (adult) (pediatric): Secondary | ICD-10-CM | POA: Diagnosis not present

## 2020-03-01 DIAGNOSIS — L97419 Non-pressure chronic ulcer of right heel and midfoot with unspecified severity: Secondary | ICD-10-CM | POA: Diagnosis not present

## 2020-03-01 DIAGNOSIS — E1140 Type 2 diabetes mellitus with diabetic neuropathy, unspecified: Secondary | ICD-10-CM | POA: Diagnosis not present

## 2020-03-01 DIAGNOSIS — Z6836 Body mass index (BMI) 36.0-36.9, adult: Secondary | ICD-10-CM | POA: Diagnosis not present

## 2020-03-07 DIAGNOSIS — Z6836 Body mass index (BMI) 36.0-36.9, adult: Secondary | ICD-10-CM | POA: Diagnosis not present

## 2020-03-07 DIAGNOSIS — L97419 Non-pressure chronic ulcer of right heel and midfoot with unspecified severity: Secondary | ICD-10-CM | POA: Diagnosis not present

## 2020-03-07 DIAGNOSIS — E782 Mixed hyperlipidemia: Secondary | ICD-10-CM | POA: Diagnosis not present

## 2020-03-07 DIAGNOSIS — E1140 Type 2 diabetes mellitus with diabetic neuropathy, unspecified: Secondary | ICD-10-CM | POA: Diagnosis not present

## 2020-03-09 ENCOUNTER — Ambulatory Visit (INDEPENDENT_AMBULATORY_CARE_PROVIDER_SITE_OTHER): Payer: Medicare HMO | Admitting: Sports Medicine

## 2020-03-09 ENCOUNTER — Encounter: Payer: Self-pay | Admitting: Sports Medicine

## 2020-03-09 ENCOUNTER — Ambulatory Visit (INDEPENDENT_AMBULATORY_CARE_PROVIDER_SITE_OTHER): Payer: Medicare HMO

## 2020-03-09 ENCOUNTER — Other Ambulatory Visit: Payer: Self-pay

## 2020-03-09 ENCOUNTER — Other Ambulatory Visit: Payer: Self-pay | Admitting: Sports Medicine

## 2020-03-09 DIAGNOSIS — L97411 Non-pressure chronic ulcer of right heel and midfoot limited to breakdown of skin: Secondary | ICD-10-CM

## 2020-03-09 DIAGNOSIS — L97511 Non-pressure chronic ulcer of other part of right foot limited to breakdown of skin: Secondary | ICD-10-CM

## 2020-03-09 DIAGNOSIS — E1142 Type 2 diabetes mellitus with diabetic polyneuropathy: Secondary | ICD-10-CM | POA: Diagnosis not present

## 2020-03-09 DIAGNOSIS — M79671 Pain in right foot: Secondary | ICD-10-CM | POA: Diagnosis not present

## 2020-03-09 NOTE — Progress Notes (Signed)
Subjective: [name] [name] is a 64 y.o. male patient seen in office for evaluation of ulceration of the right heel.  Patient has a history of diabetes and a blood glucose level  today not recorded.  Patient is changing the dressing using antibiotic cream at home.  Denies nausea/fever/vomiting/chills/night sweats/shortness of breath but admits pain to the heel 8 out of 10 with pressure reports that he had an issue with the same heel a year ago like a splint within the pad and always got callus to the area so use a pumice stone and interrupted open sore went to his primary doctor who put him on antibiotics currently taking Cipro and doxycycline.  Patient has no other pedal complaints at this time.  Review of systems noncontributory  Patient Active Problem List   Diagnosis Date Noted  . High risk medication use 05/26/2018  . Chronic anticoagulation 02/25/2018  . Class 3 severe obesity in adult ([place]) 04/28/2017  . LV dysfunction 04/28/2017  . Sinus node dysfunction ([place]) 04/21/2016  . Bradycardia 01/25/2015  . Chronic systolic CHF (congestive heart failure), NYHA class 3 ([place]) 08/30/2014  . Hypertensive heart disease with heart failure ([place]) 07/30/2014  . Diabetic neuropathy ([place]) 07/28/2014  . Mixed hyperlipidemia 07/28/2014  . Paroxysmal atrial fibrillation ([place]) 07/28/2014  . Uncontrolled type 2 diabetes mellitus with microalbuminuric diabetic nephropathy ([place]) 07/28/2014   Current Outpatient Medications on File Prior to Visit  Medication Sig Dispense Refill  . albuterol (PROVENTIL HFA;VENTOLIN HFA) 108 (90 Base) MCG/ACT inhaler INHALE 2 PUFFS BY MOUTH EVERY 4 TO 6 HOURS AS NEEDED  3  . BREO ELLIPTA 100-25 MCG/INH AEPB INHALE 1 PUFF BY MOUTH ONCE DAILY  3  . FARXIGA 10 MG TABS tablet TAKE 1 TABLET BY MOUTH ONCE DAILY FOR DIABETES    . glipiZIDE (GLUCOTROL) 10 MG tablet Take 10 mg by mouth 2 (two) times daily before a meal.    . metFORMIN (GLUCOPHAGE) 1000 MG tablet Take 1,000 mg by mouth 2  (two) times daily with a meal.    . propafenone (RYTHMOL) 225 MG tablet Take 1 tablet (225 mg total) by mouth 2 (two) times daily. 180 tablet 3  . sitaGLIPtin (JANUVIA) 100 MG tablet Take 100 mg by mouth daily.    [name] torsemide (DEMADEX) 20 MG tablet TAKE 1 TABLET THREE TIMES DAILY (Patient taking differently: Take 20 mg by mouth 2 (two) times daily. Take 1 tablet three times daily) 270 tablet 1  . XARELTO 20 MG TABS tablet TAKE 1 TABLET EVERY DAY WITH SUPPER 90 tablet 2   No current facility-administered medications on file prior to visit.   No Known Allergies  No results found for this or any previous visit (from the past 2160 hour(s)).  Objective: There were no vitals filed for this visit.  General: Patient is awake, alert, oriented x 3 and in no acute distress.  Dermatology: Skin is warm and dry bilateral with a partial thickness ulceration present right plantar central heel.  Ulceration measures 4 cm x 6 cm x 0.2 cm, post debridement. There is a macerated border with a granular base with hemorrhagic blood in the center base. The ulceration does not  probe to bone. There is minimal malodor, no active drainage, minimal erythema, no edema. No other acute signs of infection.   Vascular: Dorsalis Pedis pulse = 2/4 Bilateral,  Posterior Tibial pulse = 1/4 Bilateral,  Capillary Fill Time < 5 seconds  Neurologic: Protective sensation hypersensitive via light touch with increased pain to the  heel on the right  Musculosketal: There is pain with palpation to ulcerated area. No pain with compression to calves bilateral.    X-rays right no acute osseous findings there is evidence of a plantar heel spur but no erosions of the calcaneus at area of ulceration no gas in soft tissues. No results for input(s): GRAMSTAIN, LABORGA in the last 8760 hours.  Assessment and Plan:  Problem List Items Addressed This Visit      Endocrine   Diabetic neuropathy ([place])    Other Visit Diagnoses    Ulcer of  right heel, limited to breakdown of skin ([place])    -  Primary   Relevant Orders   WOUND CULTURE   Pain of right heel           -Examined patient and discussed the progression of the wound and treatment alternatives. -Xrays reviewed - Excisionally dedbrided ulceration at right heel to healthy bleeding borders removing nonviable tissue using a sterile chisel blade. Wound measures post debridement as above. Wound was debrided to the level of the dermis with viable wound base exposed to promote healing. Hemostasis was achieved with manuel pressure. Patient tolerated procedure well without any discomfort or anesthesia necessary for this wound debridement.  -Wound culture was obtained will call patient if we need to add or change his current antibiotics -Applied Iodosorb and dry sterile dressing and instructed patient to continue with daily dressings at home consisting of the same -Dispensed cam boot for patient to use to keep pressure off the heel to assist with healing - Advised patient to go to the ER or return to office if the wound worsens or if constitutional symptoms are present. -Patient to return to office in 1 week for follow up care and evaluation or sooner if problems arise.  Landis Martins, DPM

## 2020-03-14 ENCOUNTER — Other Ambulatory Visit: Payer: Self-pay

## 2020-03-14 ENCOUNTER — Encounter: Payer: Self-pay | Admitting: Sports Medicine

## 2020-03-14 ENCOUNTER — Ambulatory Visit (INDEPENDENT_AMBULATORY_CARE_PROVIDER_SITE_OTHER): Payer: Medicare HMO | Admitting: Sports Medicine

## 2020-03-14 DIAGNOSIS — L97411 Non-pressure chronic ulcer of right heel and midfoot limited to breakdown of skin: Secondary | ICD-10-CM

## 2020-03-14 DIAGNOSIS — E1142 Type 2 diabetes mellitus with diabetic polyneuropathy: Secondary | ICD-10-CM

## 2020-03-14 DIAGNOSIS — M79671 Pain in right foot: Secondary | ICD-10-CM

## 2020-03-14 NOTE — Progress Notes (Signed)
Subjective: [name] [name] is a 64 y.o. male patient seen in office for follow-up evaluation of ulceration of the right heel.  Patient has a history of diabetes and a blood glucose level today not recorded.  Patient is changing the dressing using Iodosorb and reports that he has a little bit more in depth-antibiotic left.  Denies nausea vomiting fever chills any this time.  Patient is assisted by wife this visit.  Patient Active Problem List   Diagnosis Date Noted  . High risk medication use 05/26/2018  . Chronic anticoagulation 02/25/2018  . Class 3 severe obesity in adult ([place]) 04/28/2017  . LV dysfunction 04/28/2017  . Sinus node dysfunction ([place]) 04/21/2016  . Bradycardia 01/25/2015  . Chronic systolic CHF (congestive heart failure), NYHA class 3 ([place]) 08/30/2014  . Hypertensive heart disease with heart failure ([place]) 07/30/2014  . Diabetic neuropathy ([place]) 07/28/2014  . Mixed hyperlipidemia 07/28/2014  . Paroxysmal atrial fibrillation ([place]) 07/28/2014  . Uncontrolled type 2 diabetes mellitus with microalbuminuric diabetic nephropathy ([place]) 07/28/2014   Current Outpatient Medications on File Prior to Visit  Medication Sig Dispense Refill  . albuterol (PROVENTIL HFA;VENTOLIN HFA) 108 (90 Base) MCG/ACT inhaler INHALE 2 PUFFS BY MOUTH EVERY 4 TO 6 HOURS AS NEEDED  3  . BREO ELLIPTA 100-25 MCG/INH AEPB INHALE 1 PUFF BY MOUTH ONCE DAILY  3  . FARXIGA 10 MG TABS tablet TAKE 1 TABLET BY MOUTH ONCE DAILY FOR DIABETES    . glipiZIDE (GLUCOTROL) 10 MG tablet Take 10 mg by mouth 2 (two) times daily before a meal.    . metFORMIN (GLUCOPHAGE) 1000 MG tablet Take 1,000 mg by mouth 2 (two) times daily with a meal.    . propafenone (RYTHMOL) 225 MG tablet Take 1 tablet (225 mg total) by mouth 2 (two) times daily. 180 tablet 3  . sitaGLIPtin (JANUVIA) 100 MG tablet Take 100 mg by mouth daily.    [name] torsemide (DEMADEX) 20 MG tablet TAKE 1 TABLET THREE TIMES DAILY (Patient taking differently: Take 20 mg by  mouth 2 (two) times daily. Take 1 tablet three times daily) 270 tablet 1  . XARELTO 20 MG TABS tablet TAKE 1 TABLET EVERY DAY WITH SUPPER 90 tablet 2   No current facility-administered medications on file prior to visit.   No Known Allergies  Recent Results (from the past 2160 hour(s))  WOUND CULTURE     Status: None (Preliminary result)   Collection Time: 03/09/20 10:50 AM   Specimen: Foot, Right; Wound   Wound Culture and sens  Result Value Ref Range   Gram Stain Result Final report    Organism ID, Bacteria Comment     Comment: No white blood cells seen.   Organism ID, Bacteria Comment     Comment: Many gram positive cocci.   Organism ID, Bacteria Comment     Comment: Few gram negative rods.   Aerobic Bacterial Culture Preliminary report    Organism ID, Bacteria Mixed skin flora     Comment: Heavy growth    Objective: There were no vitals filed for this visit.  General: Patient is awake, alert, oriented x 3 and in no acute distress.  Dermatology: Skin is warm and dry bilateral with a partial thickness ulceration present right plantar central heel.  Ulceration measures 3.5 cm x 5.5 cm x 0.2 cm, post debridement. There is a macerated border with a eschar base with hemorrhagic blood in the center base. The ulceration does not  probe to bone. There is minimal  malodor, no active drainage, minimal erythema, no edema. No other acute signs of infection.   Vascular: Dorsalis Pedis pulse = 2/4 Bilateral,  Posterior Tibial pulse = 1/4 Bilateral,  Capillary Fill Time < 5 seconds  Neurologic: Protective sensation hypersensitive via light touch with increased pain to the heel on the right  Musculosketal: There is pain with palpation to ulcerated area. No pain with compression to calves bilateral.    No results for input(s): GRAMSTAIN, LABORGA in the last 8760 hours.  Assessment and Plan:  Problem List Items Addressed This Visit      Endocrine   Diabetic neuropathy ([place])    Other  Visit Diagnoses    Ulcer of right heel, limited to breakdown of skin ([place])    -  Primary   Pain of right heel          -Examined patient and re-discussed the progression of the wound and treatment alternatives. - Excisionally dedbrided ulceration at right heel to healthy bleeding borders removing nonviable tissue using a sterile chisel blade. Wound measures post debridement as above. Wound was debrided to the level of the dermis with viable wound base exposed to promote healing. Hemostasis was achieved with manuel pressure. Patient tolerated procedure well without any discomfort or anesthesia necessary for this wound debridement.  -Applied Betadine and Iodosorb and dry sterile dressing and instructed patient to continue with daily dressings at home consisting of the same -Continue with cam boot for patient to use to keep pressure off the heel to assist with healing -Continue with doxycycline until completed - Advised patient to go to the ER or return to office if the wound worsens or if constitutional symptoms are present. -Patient to return to office in 2 weeks for follow up care and evaluation or sooner if problems arise.  Landis Martins, DPM

## 2020-03-16 DIAGNOSIS — G4733 Obstructive sleep apnea (adult) (pediatric): Secondary | ICD-10-CM | POA: Diagnosis not present

## 2020-03-17 LAB — WOUND CULTURE

## 2020-03-26 ENCOUNTER — Other Ambulatory Visit: Payer: Self-pay | Admitting: Sports Medicine

## 2020-03-26 MED ORDER — CIPROFLOXACIN HCL 500 MG PO TABS
500.0000 mg | ORAL_TABLET | Freq: Two times a day (BID) | ORAL | 0 refills | Status: DC
Start: 2020-03-26 — End: 2020-04-05

## 2020-03-26 NOTE — Progress Notes (Signed)
Sent [facility] -Dr. Cannon Kettle

## 2020-03-28 ENCOUNTER — Telehealth: Payer: Self-pay | Admitting: *Deleted

## 2020-03-28 NOTE — Telephone Encounter (Signed)
Called and spoke with the patient and the patient stated that he got the antibiotic. [name] [name]

## 2020-03-28 NOTE — Telephone Encounter (Signed)
-----   Message from Landis Martins, [place] sent at 03/26/2020  4:56 PM EDT ----- There is no antibiotic that exist with that spelling. I sent Cipro to his pharmacy instead Thanks Dr. Chauncey Cruel ----- Message ----- From: Viviana Simpler, [facility] Sent: 03/26/2020   4:39 PM EDT To: Landis Martins, DPM  Patient called and stated that he was being seen on Friday of this week and the medical doctor would not prescribe an antibiotic due to patient was seeing [place] and the wound is healing but is draining and has a smell and the antibiotic that the patient wanted is citrosloxaein is what the patient spelled out. [name]

## 2020-03-30 ENCOUNTER — Encounter: Payer: Self-pay | Admitting: Sports Medicine

## 2020-03-30 ENCOUNTER — Other Ambulatory Visit: Payer: Self-pay

## 2020-03-30 ENCOUNTER — Ambulatory Visit (INDEPENDENT_AMBULATORY_CARE_PROVIDER_SITE_OTHER): Payer: Medicare HMO | Admitting: Sports Medicine

## 2020-03-30 DIAGNOSIS — L97411 Non-pressure chronic ulcer of right heel and midfoot limited to breakdown of skin: Secondary | ICD-10-CM | POA: Diagnosis not present

## 2020-03-30 DIAGNOSIS — M79671 Pain in right foot: Secondary | ICD-10-CM

## 2020-03-30 DIAGNOSIS — E1142 Type 2 diabetes mellitus with diabetic polyneuropathy: Secondary | ICD-10-CM

## 2020-03-30 NOTE — Progress Notes (Signed)
Subjective: [name] [name] is a 64 y.o. male patient seen in office for follow-up evaluation of ulceration of the right heel.  Patient reports that pain is better still draining but he is taking his antibiotics as about a week for left.  Patient has a history of diabetes and a blood glucose level today not recorded.  Patient is changing the dressing using Iodosorb with assistance from wife without any issues.  Denies nausea vomiting fever chills any this time.  Patient is assisted by wife this visit.  Patient Active Problem List   Diagnosis Date Noted  . High risk medication use 05/26/2018  . Chronic anticoagulation 02/25/2018  . Class 3 severe obesity in adult ([place]) 04/28/2017  . LV dysfunction 04/28/2017  . Sinus node dysfunction ([place]) 04/21/2016  . Bradycardia 01/25/2015  . Chronic systolic CHF (congestive heart failure), NYHA class 3 ([place]) 08/30/2014  . Hypertensive heart disease with heart failure ([place]) 07/30/2014  . Diabetic neuropathy ([place]) 07/28/2014  . Mixed hyperlipidemia 07/28/2014  . Paroxysmal atrial fibrillation ([place]) 07/28/2014  . Uncontrolled type 2 diabetes mellitus with microalbuminuric diabetic nephropathy ([place]) 07/28/2014   Current Outpatient Medications on File Prior to Visit  Medication Sig Dispense Refill  . albuterol (PROVENTIL HFA;VENTOLIN HFA) 108 (90 Base) MCG/ACT inhaler INHALE 2 PUFFS BY MOUTH EVERY 4 TO 6 HOURS AS NEEDED  3  . BREO ELLIPTA 100-25 MCG/INH AEPB INHALE 1 PUFF BY MOUTH ONCE DAILY  3  . ciprofloxacin (CIPRO) 500 MG tablet Take 1 tablet (500 mg total) by mouth 2 (two) times daily. 20 tablet 0  . FARXIGA 10 MG TABS tablet TAKE 1 TABLET BY MOUTH ONCE DAILY FOR DIABETES    . glipiZIDE (GLUCOTROL) 10 MG tablet Take 10 mg by mouth 2 (two) times daily before a meal.    . metFORMIN (GLUCOPHAGE) 1000 MG tablet Take 1,000 mg by mouth 2 (two) times daily with a meal.    . propafenone (RYTHMOL) 225 MG tablet Take 1 tablet (225 mg total) by mouth 2 (two) times  daily. 180 tablet 3  . sitaGLIPtin (JANUVIA) 100 MG tablet Take 100 mg by mouth daily.    [name] torsemide (DEMADEX) 20 MG tablet TAKE 1 TABLET THREE TIMES DAILY (Patient taking differently: Take 20 mg by mouth 2 (two) times daily. Take 1 tablet three times daily) 270 tablet 1  . XARELTO 20 MG TABS tablet TAKE 1 TABLET EVERY DAY WITH SUPPER 90 tablet 2   No current facility-administered medications on file prior to visit.   No Known Allergies  Recent Results (from the past 2160 hour(s))  WOUND CULTURE     Status: Abnormal   Collection Time: 03/09/20 10:50 AM   Specimen: Foot, Right; Wound   Wound Culture and sens  Result Value Ref Range   Gram Stain Result Final report    Organism ID, Bacteria Comment     Comment: No white blood cells seen.   Organism ID, Bacteria Comment     Comment: Many gram positive cocci.   Organism ID, Bacteria Comment     Comment: Few gram negative rods.   Aerobic Bacterial Culture Final report (A)    Organism ID, Bacteria Staphylococcus aureus (A)     Comment: Moderate growth Based on susceptibility to oxacillin this isolate would be susceptible to: *Penicillinase-stable penicillins, such as:   Cloxacillin, Dicloxacillin, Nafcillin *Beta-lactam combination agents, such as:   Amoxicillin-clavulanic acid, Ampicillin-sulbactam,   Piperacillin-tazobactam *Oral cephems, such as:   Cefaclor, Cefdinir, Cefpodoxime, Cefprozil, Cefuroxime,   Cephalexin,  Loracarbef *Parenteral cephems, such as:   Cefazolin, Cefepime, Cefotaxime, Cefotetan, Ceftaroline,   Ceftizoxime, Ceftriaxone, Cefuroxime *Carbapenems, such as:   Doripenem, Ertapenem, Imipenem, Meropenem    Organism ID, Bacteria Mixed skin flora     Comment: Heavy growth   Antimicrobial Susceptibility Comment     Comment:       ** S = Susceptible; I = Intermediate; R = Resistant **                    P = Positive; N = Negative             MICS are expressed in micrograms per mL    Antibiotic                  RSLT#1    RSLT#2    RSLT#3    RSLT#4 Ciprofloxacin                  S Clindamycin                    S Erythromycin                   S Gentamicin                     S Levofloxacin                   S Linezolid                      S Moxifloxacin                   S Oxacillin                      S Penicillin                     R Quinupristin/Dalfopristin      S Rifampin                       S Tetracycline                   S Trimethoprim/Sulfa             S Vancomycin                     S     Objective: There were no vitals filed for this visit.  General: Patient is awake, alert, oriented x 3 and in no acute distress.  Dermatology: Skin is warm and dry bilateral with a partial thickness ulceration present right plantar central heel.  Ulceration measures 1.5 cm x 0.5 cm x 0.2 cm, post debridement which is significantly smaller than last visit. There is a macerated border with a eschar base with hemorrhagic blood in the center base. The ulceration does not  probe to bone. There is minimal malodor, no active drainage, minimal erythema, no edema. No other acute signs of infection.   Vascular: Dorsalis Pedis pulse = 2/4 Bilateral,  Posterior Tibial pulse = 1/4 Bilateral,  Capillary Fill Time < 5 seconds  Neurologic: Protective sensation hypersensitive via light touch with increased pain to the heel on the right  Musculosketal: There is pain with palpation to ulcerated area. No pain with compression to calves bilateral.    No results for input(s): GRAMSTAIN, LABORGA in the last 8760  hours.  Assessment and Plan:  Problem List Items Addressed This Visit      Endocrine   Diabetic neuropathy ([place])    Other Visit Diagnoses    Ulcer of right heel, limited to breakdown of skin ([place])    -  Primary   Pain of right heel          -Examined patient and re-discussed the progression of the wound and treatment alternatives. - Excisionally dedbrided ulceration at right heel to healthy  bleeding borders removing nonviable tissue using a sterile chisel blade. Wound measures post debridement as above. Wound was debrided to the level of the dermis with viable wound base exposed to promote healing. Hemostasis was achieved with manuel pressure. Patient tolerated procedure well without any discomfort or anesthesia necessary for this wound debridement.  -Applied iodoform packing and dry sterile dressing and instructed patient to continue with daily dressings at home consisting of the same with use of Betadine as needed to the periphery of the wound -Continue with cam boot for patient to use to keep pressure off the heel to assist with healing like before however patient does admit that he does not wear boots that often -Continue with ciprofloxacin antibiotic until completed - Advised patient to go to the ER or return to office if the wound worsens or if constitutional symptoms are present. -Patient to return to office in 2 weeks for follow up care and evaluation or sooner if problems arise.  Landis Martins, DPM

## 2020-04-05 ENCOUNTER — Other Ambulatory Visit: Payer: Self-pay | Admitting: Sports Medicine

## 2020-04-05 ENCOUNTER — Telehealth: Payer: Self-pay | Admitting: *Deleted

## 2020-04-05 MED ORDER — CIPROFLOXACIN HCL 500 MG PO TABS
500.0000 mg | ORAL_TABLET | Freq: Two times a day (BID) | ORAL | 0 refills | Status: DC
Start: 2020-04-05 — End: 2020-04-13

## 2020-04-05 NOTE — Telephone Encounter (Signed)
-----   Message from Landis Martins, [place] sent at 04/05/2020 12:18 PM EDT ----- Refill sent ----- Message ----- From: Viviana Simpler, [facility] Sent: 04/05/2020  11:22 AM EDT To: Landis Martins, DPM  Hey Dr Cannon Kettle, patient called and stated that he will be out of the antibiotic and patient stated that when he was in here the last visit that you wanted to keep him on it till the next visit. [name]

## 2020-04-05 NOTE — Progress Notes (Signed)
Refilled Cipro 

## 2020-04-05 NOTE — Telephone Encounter (Signed)
Called patient and relayed the message per Dr Stover. [name] [name] °

## 2020-04-13 ENCOUNTER — Other Ambulatory Visit: Payer: Self-pay

## 2020-04-13 ENCOUNTER — Encounter: Payer: Self-pay | Admitting: Sports Medicine

## 2020-04-13 ENCOUNTER — Ambulatory Visit (INDEPENDENT_AMBULATORY_CARE_PROVIDER_SITE_OTHER): Payer: Medicare HMO | Admitting: Sports Medicine

## 2020-04-13 DIAGNOSIS — M79671 Pain in right foot: Secondary | ICD-10-CM

## 2020-04-13 DIAGNOSIS — E1142 Type 2 diabetes mellitus with diabetic polyneuropathy: Secondary | ICD-10-CM

## 2020-04-13 DIAGNOSIS — L97411 Non-pressure chronic ulcer of right heel and midfoot limited to breakdown of skin: Secondary | ICD-10-CM

## 2020-04-13 MED ORDER — CIPROFLOXACIN HCL 500 MG PO TABS
500.0000 mg | ORAL_TABLET | Freq: Two times a day (BID) | ORAL | 0 refills | Status: DC
Start: 2020-04-13 — End: 2020-12-25

## 2020-04-13 NOTE — Progress Notes (Signed)
Subjective: [name] [name] is a 64 y.o. male patient seen in office for follow-up evaluation of ulceration of the right heel.  Patient reports that pain is better has wife changing it reports that is doing better. Fasting blood sugar not recorded. Denies constitutional symptoms.  Denies nausea vomiting fever chills any this time.    Patient Active Problem List   Diagnosis Date Noted  . High risk medication use 05/26/2018  . Chronic anticoagulation 02/25/2018  . Class 3 severe obesity in adult ([place]) 04/28/2017  . LV dysfunction 04/28/2017  . Sinus node dysfunction ([place]) 04/21/2016  . Bradycardia 01/25/2015  . Chronic systolic CHF (congestive heart failure), NYHA class 3 ([place]) 08/30/2014  . Hypertensive heart disease with heart failure ([place]) 07/30/2014  . Diabetic neuropathy ([place]) 07/28/2014  . Mixed hyperlipidemia 07/28/2014  . Paroxysmal atrial fibrillation ([place]) 07/28/2014  . Uncontrolled type 2 diabetes mellitus with microalbuminuric diabetic nephropathy ([place]) 07/28/2014   Current Outpatient Medications on File Prior to Visit  Medication Sig Dispense Refill  . albuterol (PROVENTIL HFA;VENTOLIN HFA) 108 (90 Base) MCG/ACT inhaler INHALE 2 PUFFS BY MOUTH EVERY 4 TO 6 HOURS AS NEEDED  3  . BREO ELLIPTA 100-25 MCG/INH AEPB INHALE 1 PUFF BY MOUTH ONCE DAILY  3  . ciprofloxacin (CIPRO) 500 MG tablet Take 1 tablet (500 mg total) by mouth 2 (two) times daily. 20 tablet 0  . FARXIGA 10 MG TABS tablet TAKE 1 TABLET BY MOUTH ONCE DAILY FOR DIABETES    . glipiZIDE (GLUCOTROL) 10 MG tablet Take 10 mg by mouth 2 (two) times daily before a meal.    . metFORMIN (GLUCOPHAGE) 1000 MG tablet Take 1,000 mg by mouth 2 (two) times daily with a meal.    . propafenone (RYTHMOL) 225 MG tablet Take 1 tablet (225 mg total) by mouth 2 (two) times daily. 180 tablet 3  . sitaGLIPtin (JANUVIA) 100 MG tablet Take 100 mg by mouth daily.    [name] torsemide (DEMADEX) 20 MG tablet TAKE 1 TABLET THREE TIMES DAILY (Patient  taking differently: Take 20 mg by mouth 2 (two) times daily. Take 1 tablet three times daily) 270 tablet 1  . XARELTO 20 MG TABS tablet TAKE 1 TABLET EVERY DAY WITH SUPPER 90 tablet 2   No current facility-administered medications on file prior to visit.   No Known Allergies  Recent Results (from the past 2160 hour(s))  WOUND CULTURE     Status: Abnormal   Collection Time: 03/09/20 10:50 AM   Specimen: Foot, Right; Wound   Wound Culture and sens  Result Value Ref Range   Gram Stain Result Final report    Organism ID, Bacteria Comment     Comment: No white blood cells seen.   Organism ID, Bacteria Comment     Comment: Many gram positive cocci.   Organism ID, Bacteria Comment     Comment: Few gram negative rods.   Aerobic Bacterial Culture Final report (A)    Organism ID, Bacteria Staphylococcus aureus (A)     Comment: Moderate growth Based on susceptibility to oxacillin this isolate would be susceptible to: *Penicillinase-stable penicillins, such as:   Cloxacillin, Dicloxacillin, Nafcillin *Beta-lactam combination agents, such as:   Amoxicillin-clavulanic acid, Ampicillin-sulbactam,   Piperacillin-tazobactam *Oral cephems, such as:   Cefaclor, Cefdinir, Cefpodoxime, Cefprozil, Cefuroxime,   Cephalexin, Loracarbef *Parenteral cephems, such as:   Cefazolin, Cefepime, Cefotaxime, Cefotetan, Ceftaroline,   Ceftizoxime, Ceftriaxone, Cefuroxime *Carbapenems, such as:   Doripenem, Ertapenem, Imipenem, Meropenem    Organism ID, Bacteria Mixed  skin flora     Comment: Heavy growth   Antimicrobial Susceptibility Comment     Comment:       ** S = Susceptible; I = Intermediate; R = Resistant **                    P = Positive; N = Negative             MICS are expressed in micrograms per mL    Antibiotic                 RSLT#1    RSLT#2    RSLT#3    RSLT#4 Ciprofloxacin                  S Clindamycin                    S Erythromycin                   S Gentamicin                      S Levofloxacin                   S Linezolid                      S Moxifloxacin                   S Oxacillin                      S Penicillin                     R Quinupristin/Dalfopristin      S Rifampin                       S Tetracycline                   S Trimethoprim/Sulfa             S Vancomycin                     S     Objective: There were no vitals filed for this visit.  General: Patient is awake, alert, oriented x 3 and in no acute distress.  Dermatology: Skin is warm and dry bilateral with a partial thickness ulceration present right plantar central heel.  Ulceration measures 1.4 cm x 0.3cm x 0.2 cm, post debridement which is improving in size smaller than last visit. There is a macerated border with a eschar base with hemorrhagic blood in the center base. The ulceration does not  probe to bone. There is minimal malodor, no active drainage, minimal erythema, no edema. No other acute signs of infection.   Vascular: Dorsalis Pedis pulse = 2/4 Bilateral,  Posterior Tibial pulse = 1/4 Bilateral,  Capillary Fill Time < 5 seconds  Neurologic: Protective sensation hypersensitive via light touch with increased pain to the heel on the right  Musculosketal: There is pain with palpation to ulcerated area. No pain with compression to calves bilateral.    No results for input(s): GRAMSTAIN, LABORGA in the last 8760 hours.  Assessment and Plan:  Problem List Items Addressed This Visit      Endocrine   Diabetic neuropathy ([place])    Other Visit Diagnoses  Ulcer of right heel, limited to breakdown of skin ([place])    -  Primary   Pain of right heel          -Examined patient and re-discussed the progression of the wound and treatment alternatives. - Excisionally dedbrided ulceration at right heel to healthy bleeding borders removing nonviable tissue using a sterile chisel blade. Wound measures post debridement as above. Wound was debrided to the level of the dermis with  viable wound base exposed to promote healing. Hemostasis was achieved with manuel pressure. Patient tolerated procedure well without any discomfort or anesthesia necessary for this wound debridement.  -Applied Betadine periwound and iodoform packing and dry sterile dressing and instructed patient to continue with daily dressings at home consisting of the same with use of Betadine as needed to the periphery of the wound like previous -Continue with cam boot for patient to use to keep pressure off the heel to assist with healing but patient comes in with a normal shoe -Continue with ciprofloxacin antibiotic, refill provided this visit to continue with antibiotics until the wound is healed - Advised patient to go to the ER or return to office if the wound worsens or if constitutional symptoms are present. -Patient to return to office in 2 weeks for follow up care and evaluation or sooner if problems arise.  Landis Martins, DPM

## 2020-04-27 ENCOUNTER — Ambulatory Visit (INDEPENDENT_AMBULATORY_CARE_PROVIDER_SITE_OTHER): Payer: Medicare HMO | Admitting: Sports Medicine

## 2020-04-27 ENCOUNTER — Other Ambulatory Visit: Payer: Self-pay

## 2020-04-27 ENCOUNTER — Ambulatory Visit (INDEPENDENT_AMBULATORY_CARE_PROVIDER_SITE_OTHER): Payer: Medicare HMO

## 2020-04-27 ENCOUNTER — Encounter: Payer: Self-pay | Admitting: Sports Medicine

## 2020-04-27 VITALS — Temp 96.7°F | Resp 14

## 2020-04-27 DIAGNOSIS — L02619 Cutaneous abscess of unspecified foot: Secondary | ICD-10-CM

## 2020-04-27 DIAGNOSIS — L97402 Non-pressure chronic ulcer of unspecified heel and midfoot with fat layer exposed: Secondary | ICD-10-CM | POA: Diagnosis not present

## 2020-04-27 DIAGNOSIS — E1142 Type 2 diabetes mellitus with diabetic polyneuropathy: Secondary | ICD-10-CM

## 2020-04-27 DIAGNOSIS — M79671 Pain in right foot: Secondary | ICD-10-CM

## 2020-04-27 DIAGNOSIS — L03119 Cellulitis of unspecified part of limb: Secondary | ICD-10-CM | POA: Diagnosis not present

## 2020-04-27 DIAGNOSIS — L97419 Non-pressure chronic ulcer of right heel and midfoot with unspecified severity: Secondary | ICD-10-CM | POA: Diagnosis not present

## 2020-04-27 MED ORDER — SULFAMETHOXAZOLE-TRIMETHOPRIM 800-160 MG PO TABS: 1.0000 | ORAL_TABLET | Freq: Two times a day (BID) | ORAL | 0 refills | Status: DC

## 2020-04-27 NOTE — Addendum Note (Signed)
Addended by: Cranford Mon R on: 04/27/2020 12:53 PM   Modules accepted: Orders

## 2020-04-27 NOTE — Progress Notes (Signed)
Subjective: [name] [name] is a 64 y.o. male patient seen in office for follow-up evaluation of ulceration of the right heel.  Patient is assisted by wife who reports that there is more odor and increased drainage not sure if it supposed but is concerned that it is not getting better or getting worse.  Patient reports the pain is all of his antibiotics this week.  Denies nausea vomiting fever chills any this time.    Patient Active Problem List   Diagnosis Date Noted  . High risk medication use 05/26/2018  . Chronic anticoagulation 02/25/2018  . Class 3 severe obesity in adult ([place]) 04/28/2017  . LV dysfunction 04/28/2017  . Sinus node dysfunction ([place]) 04/21/2016  . Bradycardia 01/25/2015  . Chronic systolic CHF (congestive heart failure), NYHA class 3 ([place]) 08/30/2014  . Hypertensive heart disease with heart failure ([place]) 07/30/2014  . Diabetic neuropathy ([place]) 07/28/2014  . Mixed hyperlipidemia 07/28/2014  . Paroxysmal atrial fibrillation ([place]) 07/28/2014  . Uncontrolled type 2 diabetes mellitus with microalbuminuric diabetic nephropathy ([place]) 07/28/2014   Current Outpatient Medications on File Prior to Visit  Medication Sig Dispense Refill  . ACCU-CHEK AVIVA PLUS test strip     . albuterol (PROVENTIL HFA;VENTOLIN HFA) 108 (90 Base) MCG/ACT inhaler INHALE 2 PUFFS BY MOUTH EVERY 4 TO 6 HOURS AS NEEDED  3  . BREO ELLIPTA 100-25 MCG/INH AEPB INHALE 1 PUFF BY MOUTH ONCE DAILY  3  . ciprofloxacin (CIPRO) 250 MG tablet     . ciprofloxacin (CIPRO) 500 MG tablet Take 1 tablet (500 mg total) by mouth 2 (two) times daily. 20 tablet 0  . doxycycline (VIBRA-TABS) 100 MG tablet     . FARXIGA 10 MG TABS tablet TAKE 1 TABLET BY MOUTH ONCE DAILY FOR DIABETES    . glipiZIDE (GLUCOTROL) 10 MG tablet Take 10 mg by mouth 2 (two) times daily before a meal.    . lisinopril (ZESTRIL) 5 MG tablet     . metFORMIN (GLUCOPHAGE) 1000 MG tablet Take 1,000 mg by mouth 2 (two) times daily with a meal.    .  propafenone (RYTHMOL) 225 MG tablet Take 1 tablet (225 mg total) by mouth 2 (two) times daily. 180 tablet 3  . rosuvastatin (CRESTOR) 20 MG tablet     . sitaGLIPtin (JANUVIA) 100 MG tablet Take 100 mg by mouth daily.    [name] torsemide (DEMADEX) 20 MG tablet TAKE 1 TABLET THREE TIMES DAILY (Patient taking differently: Take 20 mg by mouth 2 (two) times daily. Take 1 tablet three times daily) 270 tablet 1  . TRULICITY 1.5 MA/2.6JF SOPN     . XARELTO 20 MG TABS tablet TAKE 1 TABLET EVERY DAY WITH SUPPER 90 tablet 2   No current facility-administered medications on file prior to visit.   No Known Allergies  Recent Results (from the past 2160 hour(s))  WOUND CULTURE     Status: Abnormal   Collection Time: 03/09/20 10:50 AM   Specimen: Foot, Right; Wound   Wound Culture and sens  Result Value Ref Range   Gram Stain Result Final report    Organism ID, Bacteria Comment     Comment: No white blood cells seen.   Organism ID, Bacteria Comment     Comment: Many gram positive cocci.   Organism ID, Bacteria Comment     Comment: Few gram negative rods.   Aerobic Bacterial Culture Final report (A)    Organism ID, Bacteria Staphylococcus aureus (A)     Comment: Moderate growth  Based on susceptibility to oxacillin this isolate would be susceptible to: *Penicillinase-stable penicillins, such as:   Cloxacillin, Dicloxacillin, Nafcillin *Beta-lactam combination agents, such as:   Amoxicillin-clavulanic acid, Ampicillin-sulbactam,   Piperacillin-tazobactam *Oral cephems, such as:   Cefaclor, Cefdinir, Cefpodoxime, Cefprozil, Cefuroxime,   Cephalexin, Loracarbef *Parenteral cephems, such as:   Cefazolin, Cefepime, Cefotaxime, Cefotetan, Ceftaroline,   Ceftizoxime, Ceftriaxone, Cefuroxime *Carbapenems, such as:   Doripenem, Ertapenem, Imipenem, Meropenem    Organism ID, Bacteria Mixed skin flora     Comment: Heavy growth   Antimicrobial Susceptibility Comment     Comment:       ** S = Susceptible;  I = Intermediate; R = Resistant **                    P = Positive; N = Negative             MICS are expressed in micrograms per mL    Antibiotic                 RSLT#1    RSLT#2    RSLT#3    RSLT#4 Ciprofloxacin                  S Clindamycin                    S Erythromycin                   S Gentamicin                     S Levofloxacin                   S Linezolid                      S Moxifloxacin                   S Oxacillin                      S Penicillin                     R Quinupristin/Dalfopristin      S Rifampin                       S Tetracycline                   S Trimethoprim/Sulfa             S Vancomycin                     S     Objective: There were no vitals filed for this visit.  General: Patient is awake, alert, oriented x 3 and in no acute distress.  Dermatology: Skin is warm and dry bilateral with a partial thickness ulceration present right plantar central heel.  Ulceration measures 1.5 cm x 1 cm x 0.4 cm, post debridement which is larger than last visit. There is a macerated border with a eschar base with hemorrhagic blood in the center base. The ulceration does not probe to bone but does probe to soft tissue fat layer. There is increased malodor, no active drainage, increased erythema, increased edema. No other acute signs of infection.   Vascular: Dorsalis Pedis pulse = 2/4 Bilateral,  Posterior Tibial pulse = 1/4 Bilateral,  Capillary Fill Time < 5 seconds  Neurologic: Protective sensation hypersensitive via light touch with increased pain to the heel on the right  Musculosketal: There is pain with palpation to ulcerated area. No pain with compression to calves bilateral.    No results for input(s): GRAMSTAIN, LABORGA in the last 8760 hours.  Assessment and Plan:  Problem List Items Addressed This Visit      Endocrine   Diabetic neuropathy ([place])   Relevant Medications   TRULICITY 1.5 SA/6.3KZ SOPN   lisinopril (ZESTRIL) 5 MG tablet    rosuvastatin (CRESTOR) 20 MG tablet   Other Relevant Orders   MR ANKLE RIGHT WO CONTRAST   DG Foot Complete Right   WOUND CULTURE    Other Visit Diagnoses    Ulcer of heel and midfoot, unspecified laterality, with fat layer exposed ([place])    -  Primary   Relevant Orders   MR ANKLE RIGHT WO CONTRAST   DG Foot Complete Right   WOUND CULTURE   Pain of right heel       Relevant Orders   MR ANKLE RIGHT WO CONTRAST   DG Foot Complete Right   WOUND CULTURE   Cellulitis and abscess of foot, except toes          -Examined patient and re-discussed the progression of the wound and treatment alternatives. - Excisionally dedbrided ulceration at right heel to healthy bleeding borders removing nonviable tissue using a sterile chisel blade. Wound measures post debridement as above. Wound was debrided to the level of the dermis with viable wound base exposed to promote healing. Hemostasis was achieved with manuel pressure. Patient tolerated procedure well without any discomfort or anesthesia necessary for this wound debridement.  -Wound culture obtained and started patient preemptively on Bactrim we will call him if he needs an additional antibiotics -Applied Betadine packing and dry sterile dressing and instructed patient to continue with daily dressings at home consisting of the same with use of Betadine and not to over pack; wound care supply order placed with prism -Continue with cam boot for patient to use to keep pressure off the heel and advised patient to refrain from putting pressure on the heel -Ordered MRI for further evaluation to rule out abscess -Advised patient if infection worsens may go to the ER -Patient to return to office after MRI for follow up care and evaluation or sooner if problems arise.  Landis Martins, DPM

## 2020-05-01 LAB — WOUND CULTURE

## 2020-05-09 ENCOUNTER — Telehealth: Payer: Self-pay | Admitting: *Deleted

## 2020-05-09 ENCOUNTER — Other Ambulatory Visit: Payer: Self-pay | Admitting: Sports Medicine

## 2020-05-09 MED ORDER — SULFAMETHOXAZOLE-TRIMETHOPRIM 800-160 MG PO TABS: 1.0000 | ORAL_TABLET | Freq: Two times a day (BID) | ORAL | 0 refills | Status: DC

## 2020-05-09 NOTE — Progress Notes (Signed)
Refilled antibiotic

## 2020-05-09 NOTE — Telephone Encounter (Signed)
Patient called and stated that he was having his MRI done and wanted to let us know that he was almost out of the antibiotic and Per Dr Cannon Kettle sent over the antibiotics and I called the patient. [name]

## 2020-05-15 DIAGNOSIS — E782 Mixed hyperlipidemia: Secondary | ICD-10-CM | POA: Diagnosis not present

## 2020-05-15 DIAGNOSIS — N183 Chronic kidney disease, stage 3 unspecified: Secondary | ICD-10-CM | POA: Diagnosis not present

## 2020-05-15 DIAGNOSIS — I4891 Unspecified atrial fibrillation: Secondary | ICD-10-CM | POA: Diagnosis not present

## 2020-05-15 DIAGNOSIS — I10 Essential (primary) hypertension: Secondary | ICD-10-CM | POA: Diagnosis not present

## 2020-05-15 DIAGNOSIS — E1140 Type 2 diabetes mellitus with diabetic neuropathy, unspecified: Secondary | ICD-10-CM | POA: Diagnosis not present

## 2020-05-15 DIAGNOSIS — Z6837 Body mass index (BMI) 37.0-37.9, adult: Secondary | ICD-10-CM | POA: Diagnosis not present

## 2020-05-15 DIAGNOSIS — I5020 Unspecified systolic (congestive) heart failure: Secondary | ICD-10-CM | POA: Diagnosis not present

## 2020-05-16 ENCOUNTER — Other Ambulatory Visit: Payer: Self-pay

## 2020-05-16 ENCOUNTER — Other Ambulatory Visit: Payer: Self-pay | Admitting: Sports Medicine

## 2020-05-21 ENCOUNTER — Ambulatory Visit
Admission: RE | Admit: 2020-05-21 | Discharge: 2020-05-21 | Disposition: A | Discharge status: Home / Self Care | Payer: Medicare HMO | Source: Ambulatory Visit | Attending: Sports Medicine | Admitting: Sports Medicine

## 2020-05-21 ENCOUNTER — Other Ambulatory Visit: Payer: Self-pay

## 2020-05-21 DIAGNOSIS — L03115 Cellulitis of right lower limb: Secondary | ICD-10-CM | POA: Diagnosis not present

## 2020-05-21 DIAGNOSIS — M609 Myositis, unspecified: Secondary | ICD-10-CM | POA: Diagnosis not present

## 2020-05-21 DIAGNOSIS — L97402 Non-pressure chronic ulcer of unspecified heel and midfoot with fat layer exposed: Secondary | ICD-10-CM

## 2020-05-21 DIAGNOSIS — L97419 Non-pressure chronic ulcer of right heel and midfoot with unspecified severity: Secondary | ICD-10-CM | POA: Diagnosis not present

## 2020-05-21 DIAGNOSIS — E1142 Type 2 diabetes mellitus with diabetic polyneuropathy: Secondary | ICD-10-CM

## 2020-05-21 DIAGNOSIS — M79671 Pain in right foot: Secondary | ICD-10-CM

## 2020-05-24 ENCOUNTER — Other Ambulatory Visit: Payer: Self-pay | Admitting: Sports Medicine

## 2020-05-24 ENCOUNTER — Telehealth: Payer: Self-pay | Admitting: *Deleted

## 2020-05-24 MED ORDER — SULFAMETHOXAZOLE-TRIMETHOPRIM 800-160 MG PO TABS: 1.0000 | ORAL_TABLET | Freq: Two times a day (BID) | ORAL | 0 refills | Status: DC

## 2020-05-24 NOTE — Progress Notes (Signed)
Refilled bactrim

## 2020-05-24 NOTE — Telephone Encounter (Signed)
-----   Message from Landis Martins, [place] sent at 05/21/2020 11:40 PM EST ----- Will you let patient know that his MRI shows: Shallow foot wound with surrounding granulation tissue and inflammation/edema/focal cellulitis. There is no abscess or puss pocket. There is no bone infection. No need for surgery at this time. We will continue with antibiotics and local wound care.

## 2020-05-24 NOTE — Telephone Encounter (Signed)
Called and spoke with the patient and relayed the message per Dr Cannon Kettle and patient stated that his son just got tested for Covid and will not be in the office for at least two weeks and will need a refill of the antibiotics. [name]

## 2020-06-04 ENCOUNTER — Other Ambulatory Visit: Payer: Self-pay | Admitting: Sports Medicine

## 2020-06-04 MED ORDER — SULFAMETHOXAZOLE-TRIMETHOPRIM 800-160 MG PO TABS
1.0000 | ORAL_TABLET | Freq: Two times a day (BID) | ORAL | 0 refills | Status: DC
Start: 2020-06-04 — End: 2020-06-21

## 2020-06-04 NOTE — Progress Notes (Signed)
Refilled Bactrim and nurse will call patient back re: questions

## 2020-06-05 ENCOUNTER — Telehealth: Payer: Self-pay | Admitting: *Deleted

## 2020-06-05 NOTE — Telephone Encounter (Signed)
-----   Message from [name] [name], [place] sent at 06/04/2020  7:32 PM EST ----- I sent refill of his antibiotic to his pharmacy. Please call and let me know what questions he has. Thanks Dr. Chauncey Cruel ----- Message ----- From: Viviana Simpler, [facility] Sent: 06/04/2020   4:55 PM EST To: [name] [name], DPM  Patient called and stated that he needs some antibiotics called into the pharmacy and had a couple of questions and I will call the patient. [name]

## 2020-06-05 NOTE — Telephone Encounter (Signed)
Called and spoke with the patient and relayed the message per Dr Stover. Eoghan Belcher °

## 2020-06-06 DIAGNOSIS — R809 Proteinuria, unspecified: Secondary | ICD-10-CM | POA: Diagnosis not present

## 2020-06-06 DIAGNOSIS — I509 Heart failure, unspecified: Secondary | ICD-10-CM | POA: Diagnosis not present

## 2020-06-06 DIAGNOSIS — E1122 Type 2 diabetes mellitus with diabetic chronic kidney disease: Secondary | ICD-10-CM | POA: Diagnosis not present

## 2020-06-06 DIAGNOSIS — E785 Hyperlipidemia, unspecified: Secondary | ICD-10-CM | POA: Diagnosis not present

## 2020-06-06 DIAGNOSIS — N184 Chronic kidney disease, stage 4 (severe): Secondary | ICD-10-CM | POA: Diagnosis not present

## 2020-06-06 DIAGNOSIS — N2581 Secondary hyperparathyroidism of renal origin: Secondary | ICD-10-CM | POA: Diagnosis not present

## 2020-06-08 ENCOUNTER — Other Ambulatory Visit: Payer: Self-pay

## 2020-06-08 ENCOUNTER — Ambulatory Visit (INDEPENDENT_AMBULATORY_CARE_PROVIDER_SITE_OTHER): Payer: Medicare HMO | Admitting: Sports Medicine

## 2020-06-08 ENCOUNTER — Encounter: Payer: Self-pay | Admitting: Sports Medicine

## 2020-06-08 DIAGNOSIS — M79671 Pain in right foot: Secondary | ICD-10-CM

## 2020-06-08 DIAGNOSIS — L97419 Non-pressure chronic ulcer of right heel and midfoot with unspecified severity: Secondary | ICD-10-CM | POA: Diagnosis not present

## 2020-06-08 DIAGNOSIS — L03119 Cellulitis of unspecified part of limb: Secondary | ICD-10-CM

## 2020-06-08 DIAGNOSIS — L02619 Cutaneous abscess of unspecified foot: Secondary | ICD-10-CM

## 2020-06-08 DIAGNOSIS — L97402 Non-pressure chronic ulcer of unspecified heel and midfoot with fat layer exposed: Secondary | ICD-10-CM | POA: Diagnosis not present

## 2020-06-08 DIAGNOSIS — E1142 Type 2 diabetes mellitus with diabetic polyneuropathy: Secondary | ICD-10-CM

## 2020-06-08 NOTE — Progress Notes (Signed)
Subjective: Daronte Shostak is a 64 y.o. male patient seen in office for follow-up evaluation of ulceration of the right heel.  Patient reports that the wound is doing good less drainage no pain and redness since swelling has resolved.  Reports that he has been taking his antibiotics consistently with improvement.  Denies nausea vomiting fever chills any this time.    Patient Active Problem List   Diagnosis Date Noted  . High risk medication use 05/26/2018  . Chronic anticoagulation 02/25/2018  . Class 3 severe obesity in adult ([place]) 04/28/2017  . LV dysfunction 04/28/2017  . Sinus node dysfunction ([place]) 04/21/2016  . Bradycardia 01/25/2015  . Chronic systolic CHF (congestive heart failure), NYHA class 3 ([place]) 08/30/2014  . Hypertensive heart disease with heart failure ([place]) 07/30/2014  . Diabetic neuropathy ([place]) 07/28/2014  . Mixed hyperlipidemia 07/28/2014  . Paroxysmal atrial fibrillation ([place]) 07/28/2014  . Uncontrolled type 2 diabetes mellitus with microalbuminuric diabetic nephropathy ([place]) 07/28/2014   Current Outpatient Medications on File Prior to Visit  Medication Sig Dispense Refill  . ACCU-CHEK AVIVA PLUS test strip     . albuterol (PROVENTIL HFA;VENTOLIN HFA) 108 (90 Base) MCG/ACT inhaler INHALE 2 PUFFS BY MOUTH EVERY 4 TO 6 HOURS AS NEEDED  3  . BREO ELLIPTA 100-25 MCG/INH AEPB INHALE 1 PUFF BY MOUTH ONCE DAILY  3  . ciprofloxacin (CIPRO) 250 MG tablet     . ciprofloxacin (CIPRO) 500 MG tablet Take 1 tablet (500 mg total) by mouth 2 (two) times daily. 20 tablet 0  . doxycycline (VIBRA-TABS) 100 MG tablet     . FARXIGA 10 MG TABS tablet TAKE 1 TABLET BY MOUTH ONCE DAILY FOR DIABETES    . glipiZIDE (GLUCOTROL) 10 MG tablet Take 10 mg by mouth 2 (two) times daily before a meal.    . lisinopril (ZESTRIL) 5 MG tablet     . metFORMIN (GLUCOPHAGE) 1000 MG tablet Take 1,000 mg by mouth 2 (two) times daily with a meal.    . propafenone (RYTHMOL) 225 MG tablet Take 1 tablet (225 mg  total) by mouth 2 (two) times daily. 180 tablet 3  . rosuvastatin (CRESTOR) 20 MG tablet     . sitaGLIPtin (JANUVIA) 100 MG tablet Take 100 mg by mouth daily.    [name] sulfamethoxazole-trimethoprim (BACTRIM DS) 800-160 MG tablet Take 1 tablet by mouth 2 (two) times daily. 28 tablet 0  . torsemide (DEMADEX) 20 MG tablet TAKE 1 TABLET THREE TIMES DAILY (Patient taking differently: Take 20 mg by mouth 2 (two) times daily. Take 1 tablet three times daily) 270 tablet 1  . TRULICITY 1.5 WG/6.6ZL SOPN     . XARELTO 20 MG TABS tablet TAKE 1 TABLET EVERY DAY WITH SUPPER 90 tablet 2   No current facility-administered medications on file prior to visit.   No Known Allergies  Recent Results (from the past 2160 hour(s))  WOUND CULTURE     Status: None   Collection Time: 04/27/20 12:52 PM   Specimen: Foot, Right; Wound   Wound Lab corp  Result Value Ref Range   Gram Stain Result Final report    Organism ID, Bacteria Comment     Comment: Many white blood cells.   Organism ID, Bacteria Comment     Comment: Many gram positive cocci.   Organism ID, Bacteria Comment     Comment: Many gram negative rods.   Aerobic Bacterial Culture Final report    Organism ID, Bacteria Routine flora     Comment:  Heavy growth    Objective: There were no vitals filed for this visit.  General: Patient is awake, alert, oriented x 3 and in no acute distress.  Dermatology: Skin is warm and dry bilateral with a partial thickness ulceration present right plantar central heel.  Ulceration measures 0.9 cm x 0.9 cm x 0.2 cm predebridement and measures 1.0 cm x 0.9 cm x 0.3 cm, post debridement which is smaller than last visit.  There is a keratotic border with a granular base.  The ulceration does not probe to bone but does probe to soft tissue fat layer like before that appears to be improving. There is no malodor, no active drainage, resolved erythema, decreased edema. No other acute signs of infection.   Vascular: Dorsalis  Pedis pulse = 2/4 Bilateral,  Posterior Tibial pulse = 1/4 Bilateral,  Capillary Fill Time < 5 seconds  Neurologic: Protective sensation hypersensitive via light touch with decreased pain to the heel on the right  Musculosketal: There is no reproducible pain with palpation to ulcerated area. No pain with compression to calves bilateral.    No results for input(s): GRAMSTAIN, LABORGA in the last 8760 hours.  Assessment and Plan:  Problem List Items Addressed This Visit      Endocrine   Diabetic neuropathy ([place])    Other Visit Diagnoses    Ulcer of heel and midfoot, unspecified laterality, with fat layer exposed ([place])    -  Primary   Pain of right heel       Cellulitis and abscess of foot, except toes       Much improved      -Examined patient and re-discussed the progression of the wound and treatment alternatives. -Previous MRI results previously reviewed - Excisionally dedbrided ulceration at right heel to healthy bleeding borders removing nonviable tissue using a sterile chisel blade. Wound measures post debridement as above. Wound was debrided to the level of the dermis with viable wound base exposed to promote healing. Hemostasis was achieved with manuel pressure. Patient tolerated procedure well without any discomfort or anesthesia necessary for this wound debridement.  -Continue with Bactrim antibiotic -Applied Betadine packing and dry sterile dressing and instructed patient to continue with daily dressings at home consisting of the same with use of Betadine and not to over pack; wound care supply order placed with prism to stand more 3 inch [place] -Continue with cam boot for patient to use to keep pressure off the heel and advised patient to refrain from putting pressure on the heel however patient still prefers tennis shoe with heel cushion -Patient to return to office in 3 weeks for follow up care and evaluation or sooner if problems arise.  Landis Martins, DPM

## 2020-06-14 DIAGNOSIS — G4733 Obstructive sleep apnea (adult) (pediatric): Secondary | ICD-10-CM | POA: Diagnosis not present

## 2020-06-21 ENCOUNTER — Other Ambulatory Visit: Payer: Self-pay | Admitting: Sports Medicine

## 2020-06-21 MED ORDER — SULFAMETHOXAZOLE-TRIMETHOPRIM 800-160 MG PO TABS
1.0000 | ORAL_TABLET | Freq: Two times a day (BID) | ORAL | 0 refills | Status: DC
Start: 2020-06-21 — End: 2020-07-04

## 2020-06-21 NOTE — Progress Notes (Signed)
Refill sent.

## 2020-06-23 HISTORY — PX: EYE SURGERY: SHX253

## 2020-06-25 DIAGNOSIS — E119 Type 2 diabetes mellitus without complications: Secondary | ICD-10-CM | POA: Diagnosis not present

## 2020-07-04 ENCOUNTER — Ambulatory Visit (INDEPENDENT_AMBULATORY_CARE_PROVIDER_SITE_OTHER): Payer: Medicare HMO | Admitting: Sports Medicine

## 2020-07-04 ENCOUNTER — Encounter: Payer: Self-pay | Admitting: Sports Medicine

## 2020-07-04 ENCOUNTER — Other Ambulatory Visit: Payer: Self-pay

## 2020-07-04 DIAGNOSIS — L97402 Non-pressure chronic ulcer of unspecified heel and midfoot with fat layer exposed: Secondary | ICD-10-CM | POA: Diagnosis not present

## 2020-07-04 DIAGNOSIS — L03119 Cellulitis of unspecified part of limb: Secondary | ICD-10-CM

## 2020-07-04 DIAGNOSIS — L02619 Cutaneous abscess of unspecified foot: Secondary | ICD-10-CM

## 2020-07-04 DIAGNOSIS — E11621 Type 2 diabetes mellitus with foot ulcer: Secondary | ICD-10-CM | POA: Diagnosis not present

## 2020-07-04 DIAGNOSIS — E1140 Type 2 diabetes mellitus with diabetic neuropathy, unspecified: Secondary | ICD-10-CM | POA: Diagnosis not present

## 2020-07-04 DIAGNOSIS — E1142 Type 2 diabetes mellitus with diabetic polyneuropathy: Secondary | ICD-10-CM

## 2020-07-04 DIAGNOSIS — M79671 Pain in right foot: Secondary | ICD-10-CM

## 2020-07-04 MED ORDER — SULFAMETHOXAZOLE-TRIMETHOPRIM 800-160 MG PO TABS: 1.0000 | ORAL_TABLET | Freq: Two times a day (BID) | ORAL | 1 refills | Status: DC

## 2020-07-04 NOTE — Progress Notes (Signed)
Subjective: [name] [name] is a 65 y.o. male patient seen in office for follow-up evaluation of ulceration of the right heel.  Patient reports that the wound is doing good no drainage and very minimal pain.  Patient reports he may need a refill on his antibiotic.  Patient has nephew helping to change dressing.  Patient denies nausea vomiting fever chills or any other constitutional symptoms at this time.    Fasting blood sugar not recorded patient states that it may be elevated due to his recent consumption of cookies.  Last PCP visit not recorded   Patient Active Problem List   Diagnosis Date Noted  . High risk medication use 05/26/2018  . Chronic anticoagulation 02/25/2018  . Class 3 severe obesity in adult ([place]) 04/28/2017  . LV dysfunction 04/28/2017  . Sinus node dysfunction ([place]) 04/21/2016  . Bradycardia 01/25/2015  . Chronic systolic CHF (congestive heart failure), NYHA class 3 ([place]) 08/30/2014  . Hypertensive heart disease with heart failure ([place]) 07/30/2014  . Diabetic neuropathy ([place]) 07/28/2014  . Mixed hyperlipidemia 07/28/2014  . Paroxysmal atrial fibrillation ([place]) 07/28/2014  . Uncontrolled type 2 diabetes mellitus with microalbuminuric diabetic nephropathy ([place]) 07/28/2014   Current Outpatient Medications on File Prior to Visit  Medication Sig Dispense Refill  . ACCU-CHEK AVIVA PLUS test strip     . albuterol (PROVENTIL HFA;VENTOLIN HFA) 108 (90 Base) MCG/ACT inhaler INHALE 2 PUFFS BY MOUTH EVERY 4 TO 6 HOURS AS NEEDED  3  . BREO ELLIPTA 100-25 MCG/INH AEPB INHALE 1 PUFF BY MOUTH ONCE DAILY  3  . ciprofloxacin (CIPRO) 250 MG tablet     . ciprofloxacin (CIPRO) 500 MG tablet Take 1 tablet (500 mg total) by mouth 2 (two) times daily. 20 tablet 0  . doxycycline (VIBRA-TABS) 100 MG tablet     . FARXIGA 10 MG TABS tablet TAKE 1 TABLET BY MOUTH ONCE DAILY FOR DIABETES    . glipiZIDE (GLUCOTROL) 10 MG tablet Take 10 mg by mouth 2 (two) times daily before a meal.    .  lisinopril (ZESTRIL) 5 MG tablet     . metFORMIN (GLUCOPHAGE) 1000 MG tablet Take 1,000 mg by mouth 2 (two) times daily with a meal.    . propafenone (RYTHMOL) 225 MG tablet Take 1 tablet (225 mg total) by mouth 2 (two) times daily. 180 tablet 3  . rosuvastatin (CRESTOR) 20 MG tablet     . sitaGLIPtin (JANUVIA) 100 MG tablet Take 100 mg by mouth daily.    [name] torsemide (DEMADEX) 20 MG tablet TAKE 1 TABLET THREE TIMES DAILY (Patient taking differently: Take 20 mg by mouth 2 (two) times daily. Take 1 tablet three times daily) 270 tablet 1  . TRULICITY 1.5 HK/7.4QV SOPN     . XARELTO 20 MG TABS tablet TAKE 1 TABLET EVERY DAY WITH SUPPER 90 tablet 2   No current facility-administered medications on file prior to visit.   No Known Allergies  Recent Results (from the past 2160 hour(s))  WOUND CULTURE     Status: None   Collection Time: 04/27/20 12:52 PM   Specimen: Foot, Right; Wound   Wound Lab corp  Result Value Ref Range   Gram Stain Result Final report    Organism ID, Bacteria Comment     Comment: Many white blood cells.   Organism ID, Bacteria Comment     Comment: Many gram positive cocci.   Organism ID, Bacteria Comment     Comment: Many gram negative rods.   Aerobic Bacterial  Culture Final report    Organism ID, Bacteria Routine flora     Comment: Heavy growth    Objective: There were no vitals filed for this visit.  General: Patient is awake, alert, oriented x 3 and in no acute distress.  Dermatology: Skin is warm and dry bilateral with a partial thickness ulceration present right plantar central heel.  Ulceration measures 0.3 cm x 0.3 cm x 0.2 cm predebridement and measures 0.3 cm x 0.4 cm x 0.3 cm, post debridement which is smaller than last visit.  There is a keratotic border with a granular base.  The ulceration does not probe to bone but does probe to soft tissue fat layer like before that appears to be improving and filling in. There is no malodor, no active drainage,  resolved erythema, decreased edema. No other acute signs of infection.   Vascular: Dorsalis Pedis pulse = 2/4 Bilateral,  Posterior Tibial pulse = 1/4 Bilateral,  Capillary Fill Time < 5 seconds  Neurologic: Protective sensation hypersensitive via light touch with decreased pain to the heel on the right  Musculosketal: There is no reproducible pain with palpation to ulcerated area right heel. No pain with compression to calves bilateral.    No results for input(s): GRAMSTAIN, LABORGA in the last 8760 hours.  Assessment and Plan:  Problem List Items Addressed This Visit      Endocrine   Diabetic neuropathy ([place])    Other Visit Diagnoses    Ulcer of heel and midfoot, unspecified laterality, with fat layer exposed ([place])    -  Primary   Pain of right heel       Cellulitis and abscess of foot, except toes          -Examined patient and re-discussed the progression of the wound and treatment alternatives. - Excisionally dedbrided ulceration at right heel to healthy bleeding borders removing nonviable tissue using a sterile chisel blade. Wound measures post debridement as above. Wound was debrided to the level of the dermis with viable wound base exposed to promote healing. Hemostasis was achieved with manuel pressure. Patient tolerated procedure well without any discomfort or anesthesia necessary for this wound debridement.  -Applied Betadine packing and dry sterile dressing and instructed patient to continue with daily dressings at home consisting of the same  -Continue with Bactrim refill provided for at least 1 more months -Continue with heel offloading cushion to tennis shoe or return to cam boot if worsens -Patient to return to office in 3-4 weeks for follow up care and evaluation or sooner if problems arise.  Landis Martins, DPM

## 2020-07-11 DIAGNOSIS — G4733 Obstructive sleep apnea (adult) (pediatric): Secondary | ICD-10-CM | POA: Diagnosis not present

## 2020-07-11 DIAGNOSIS — R0600 Dyspnea, unspecified: Secondary | ICD-10-CM | POA: Diagnosis not present

## 2020-07-11 DIAGNOSIS — E668 Other obesity: Secondary | ICD-10-CM | POA: Diagnosis not present

## 2020-07-11 DIAGNOSIS — J449 Chronic obstructive pulmonary disease, unspecified: Secondary | ICD-10-CM | POA: Diagnosis not present

## 2020-07-27 ENCOUNTER — Ambulatory Visit (INDEPENDENT_AMBULATORY_CARE_PROVIDER_SITE_OTHER): Payer: Medicare HMO | Admitting: Sports Medicine

## 2020-07-27 ENCOUNTER — Other Ambulatory Visit: Payer: Self-pay

## 2020-07-27 DIAGNOSIS — E1142 Type 2 diabetes mellitus with diabetic polyneuropathy: Secondary | ICD-10-CM | POA: Diagnosis not present

## 2020-07-27 DIAGNOSIS — M79671 Pain in right foot: Secondary | ICD-10-CM

## 2020-07-27 DIAGNOSIS — L97411 Non-pressure chronic ulcer of right heel and midfoot limited to breakdown of skin: Secondary | ICD-10-CM | POA: Diagnosis not present

## 2020-07-27 NOTE — Progress Notes (Signed)
Subjective: [name] [name] is a 65 y.o. male patient seen in office for follow-up evaluation of ulceration of the right heel.  Patient reports that the wound is doing good was not sure if he was still supposed to put Betadine on it since it has healed over but did anyway until he was seen back in office with the help of his nephew  to change dressing.  Patient denies nausea vomiting fever chills or any other constitutional symptoms at this time.    Fasting blood sugar not recorded  Last PCP visit not recorded   Patient Active Problem List   Diagnosis Date Noted  . High risk medication use 05/26/2018  . Chronic anticoagulation 02/25/2018  . Class 3 severe obesity in adult ([place]) 04/28/2017  . LV dysfunction 04/28/2017  . Sinus node dysfunction ([place]) 04/21/2016  . Bradycardia 01/25/2015  . Chronic systolic CHF (congestive heart failure), NYHA class 3 ([place]) 08/30/2014  . Hypertensive heart disease with heart failure ([place]) 07/30/2014  . Diabetic neuropathy ([place]) 07/28/2014  . Mixed hyperlipidemia 07/28/2014  . Paroxysmal atrial fibrillation ([place]) 07/28/2014  . Uncontrolled type 2 diabetes mellitus with microalbuminuric diabetic nephropathy ([place]) 07/28/2014   Current Outpatient Medications on File Prior to Visit  Medication Sig Dispense Refill  . ACCU-CHEK AVIVA PLUS test strip     . albuterol (PROVENTIL HFA;VENTOLIN HFA) 108 (90 Base) MCG/ACT inhaler INHALE 2 PUFFS BY MOUTH EVERY 4 TO 6 HOURS AS NEEDED  3  . BREO ELLIPTA 100-25 MCG/INH AEPB INHALE 1 PUFF BY MOUTH ONCE DAILY  3  . ciprofloxacin (CIPRO) 250 MG tablet     . ciprofloxacin (CIPRO) 500 MG tablet Take 1 tablet (500 mg total) by mouth 2 (two) times daily. 20 tablet 0  . doxycycline (VIBRA-TABS) 100 MG tablet     . FARXIGA 10 MG TABS tablet TAKE 1 TABLET BY MOUTH ONCE DAILY FOR DIABETES    . glipiZIDE (GLUCOTROL) 10 MG tablet Take 10 mg by mouth 2 (two) times daily before a meal.    . lisinopril (ZESTRIL) 5 MG tablet     .  metFORMIN (GLUCOPHAGE) 1000 MG tablet Take 1,000 mg by mouth 2 (two) times daily with a meal.    . propafenone (RYTHMOL) 225 MG tablet Take 1 tablet (225 mg total) by mouth 2 (two) times daily. 180 tablet 3  . rosuvastatin (CRESTOR) 20 MG tablet     . sitaGLIPtin (JANUVIA) 100 MG tablet Take 100 mg by mouth daily.    [name] sulfamethoxazole-trimethoprim (BACTRIM DS) 800-160 MG tablet Take 1 tablet by mouth 2 (two) times daily. 28 tablet 1  . torsemide (DEMADEX) 20 MG tablet TAKE 1 TABLET THREE TIMES DAILY (Patient taking differently: Take 20 mg by mouth 2 (two) times daily. Take 1 tablet three times daily) 270 tablet 1  . TRULICITY 1.5 0000000 SOPN     . XARELTO 20 MG TABS tablet TAKE 1 TABLET EVERY DAY WITH SUPPER 90 tablet 2   No current facility-administered medications on file prior to visit.   No Known Allergies  No results found for this or any previous visit (from the past 2160 hour(s)).  Objective: There were no vitals filed for this visit.  General: Patient is awake, alert, oriented x 3 and in no acute distress.  Dermatology: Skin is warm and dry bilateral with a keratotic lesion plantar central heel with no underlying opening once debrided previous area of ulceration appears to be resolved.  There is no malodor, no active drainage, no erythema  no edema. No other acute signs of infection.   Vascular: Dorsalis Pedis pulse = 2/4 Bilateral,  Posterior Tibial pulse = 1/4 Bilateral,  Capillary Fill Time < 5 seconds  Neurologic: Protective sensation hypersensitive via light touch with decreased pain to the heel on the right  Musculosketal: There is no reproducible pain with palpation to the now healed ulcerated area on the right heel. No pain with compression to calves bilateral.    No results for input(s): GRAMSTAIN, LABORGA in the last 8760 hours.  Assessment and Plan:  Problem List Items Addressed This Visit      Endocrine   Diabetic neuropathy ([place])    Other Visit Diagnoses     Skin ulcer of right heel, limited to breakdown of skin ([place])    -  Primary   Prematurely resolved   Pain of right heel          -Examined patient  -Using a 15 blade mechanically debrided keratosis at the right heel no underlying opening noted previous ulceration is healed -Applied Betadine and Band-Aid dressing and advised patient to do the same for 1 more week after 1 week may discontinue -No further need for Bactrim antibiotic at this time -Continue with heel offloading cushion to tennis shoe and cane -Patient to return to office in 3-4 weeks for final wound check and evaluation or sooner if problems arise.  Landis Martins, DPM

## 2020-08-10 DIAGNOSIS — H2513 Age-related nuclear cataract, bilateral: Secondary | ICD-10-CM | POA: Diagnosis not present

## 2020-08-10 DIAGNOSIS — Z7984 Long term (current) use of oral hypoglycemic drugs: Secondary | ICD-10-CM | POA: Diagnosis not present

## 2020-08-10 DIAGNOSIS — E119 Type 2 diabetes mellitus without complications: Secondary | ICD-10-CM | POA: Diagnosis not present

## 2020-08-10 DIAGNOSIS — Z0100 Encounter for examination of eyes and vision without abnormal findings: Secondary | ICD-10-CM | POA: Diagnosis not present

## 2020-08-10 DIAGNOSIS — H40003 Preglaucoma, unspecified, bilateral: Secondary | ICD-10-CM | POA: Diagnosis not present

## 2020-08-15 DIAGNOSIS — E1140 Type 2 diabetes mellitus with diabetic neuropathy, unspecified: Secondary | ICD-10-CM | POA: Diagnosis not present

## 2020-08-15 DIAGNOSIS — I4891 Unspecified atrial fibrillation: Secondary | ICD-10-CM | POA: Diagnosis not present

## 2020-08-15 DIAGNOSIS — E782 Mixed hyperlipidemia: Secondary | ICD-10-CM | POA: Diagnosis not present

## 2020-08-15 DIAGNOSIS — N183 Chronic kidney disease, stage 3 unspecified: Secondary | ICD-10-CM | POA: Diagnosis not present

## 2020-08-15 DIAGNOSIS — I10 Essential (primary) hypertension: Secondary | ICD-10-CM | POA: Diagnosis not present

## 2020-08-15 DIAGNOSIS — Z6839 Body mass index (BMI) 39.0-39.9, adult: Secondary | ICD-10-CM | POA: Diagnosis not present

## 2020-08-15 DIAGNOSIS — I5020 Unspecified systolic (congestive) heart failure: Secondary | ICD-10-CM | POA: Diagnosis not present

## 2020-08-24 ENCOUNTER — Ambulatory Visit (INDEPENDENT_AMBULATORY_CARE_PROVIDER_SITE_OTHER): Payer: Medicare HMO | Admitting: Sports Medicine

## 2020-08-24 ENCOUNTER — Encounter: Payer: Self-pay | Admitting: Sports Medicine

## 2020-08-24 ENCOUNTER — Other Ambulatory Visit: Payer: Self-pay

## 2020-08-24 DIAGNOSIS — M79671 Pain in right foot: Secondary | ICD-10-CM

## 2020-08-24 DIAGNOSIS — E1142 Type 2 diabetes mellitus with diabetic polyneuropathy: Secondary | ICD-10-CM

## 2020-08-24 DIAGNOSIS — L97411 Non-pressure chronic ulcer of right heel and midfoot limited to breakdown of skin: Secondary | ICD-10-CM | POA: Diagnosis not present

## 2020-08-24 NOTE — Progress Notes (Signed)
Subjective: [name] [name] is a 65 y.o. male patient seen in office for follow-up evaluation of ulceration of the right heel.  Patient reports that he is doing good, no pain or drainage.  Patient denies nausea vomiting fever chills or any other constitutional symptoms at this time.    Fasting blood sugar not recorded  Last PCP visit not recorded   Patient Active Problem List   Diagnosis Date Noted  . High risk medication use 05/26/2018  . Chronic anticoagulation 02/25/2018  . Class 3 severe obesity in adult ([place]) 04/28/2017  . LV dysfunction 04/28/2017  . Sinus node dysfunction ([place]) 04/21/2016  . Bradycardia 01/25/2015  . Chronic systolic CHF (congestive heart failure), NYHA class 3 ([place]) 08/30/2014  . Hypertensive heart disease with heart failure ([place]) 07/30/2014  . Diabetic neuropathy ([place]) 07/28/2014  . Mixed hyperlipidemia 07/28/2014  . Paroxysmal atrial fibrillation ([place]) 07/28/2014  . Uncontrolled type 2 diabetes mellitus with microalbuminuric diabetic nephropathy ([place]) 07/28/2014   Current Outpatient Medications on File Prior to Visit  Medication Sig Dispense Refill  . hydrochlorothiazide (HYDRODIURIL) 25 MG tablet Take 1 tablet by mouth daily.    [name] ACCU-CHEK AVIVA PLUS test strip     . albuterol (PROVENTIL HFA;VENTOLIN HFA) 108 (90 Base) MCG/ACT inhaler INHALE 2 PUFFS BY MOUTH EVERY 4 TO 6 HOURS AS NEEDED  3  . BREO ELLIPTA 100-25 MCG/INH AEPB INHALE 1 PUFF BY MOUTH ONCE DAILY  3  . ciprofloxacin (CIPRO) 250 MG tablet     . ciprofloxacin (CIPRO) 500 MG tablet Take 1 tablet (500 mg total) by mouth 2 (two) times daily. 20 tablet 0  . doxycycline (VIBRA-TABS) 100 MG tablet     . FARXIGA 10 MG TABS tablet TAKE 1 TABLET BY MOUTH ONCE DAILY FOR DIABETES    . glipiZIDE (GLUCOTROL) 10 MG tablet Take 10 mg by mouth 2 (two) times daily before a meal.    . lisinopril (ZESTRIL) 5 MG tablet     . metFORMIN (GLUCOPHAGE) 1000 MG tablet Take 1,000 mg by mouth 2 (two) times daily with a  meal.    . propafenone (RYTHMOL) 225 MG tablet Take 1 tablet (225 mg total) by mouth 2 (two) times daily. 180 tablet 3  . rosuvastatin (CRESTOR) 20 MG tablet     . sitaGLIPtin (JANUVIA) 100 MG tablet Take 100 mg by mouth daily.    [name] sulfamethoxazole-trimethoprim (BACTRIM DS) 800-160 MG tablet Take 1 tablet by mouth 2 (two) times daily. 28 tablet 1  . torsemide (DEMADEX) 20 MG tablet TAKE 1 TABLET THREE TIMES DAILY (Patient taking differently: Take 20 mg by mouth 2 (two) times daily. Take 1 tablet three times daily) 270 tablet 1  . TRULICITY 1.5 0000000 SOPN     . XARELTO 20 MG TABS tablet TAKE 1 TABLET EVERY DAY WITH SUPPER 90 tablet 2   No current facility-administered medications on file prior to visit.   No Known Allergies  No results found for this or any previous visit (from the past 2160 hour(s)).  Objective: There were no vitals filed for this visit.  General: Patient is awake, alert, oriented x 3 and in no acute distress.  Dermatology: Skin is warm and dry bilateral with a keratotic lesion plantar central heel with no underlying opening once debrided previous area of ulceration appears to be resolved.  There is no malodor, no active drainage, no erythema no edema. No other acute signs of infection.   Vascular: Dorsalis Pedis pulse = 2/4 Bilateral,  Posterior Tibial  pulse = 1/4 Bilateral,  Capillary Fill Time < 5 seconds  Neurologic: Protective sensation hypersensitive via light touch with decreased pain to the heel on the right.  Musculosketal: There is no reproducible pain to right heel. No pain with compression to calves bilateral.    No results for input(s): GRAMSTAIN, LABORGA in the last 8760 hours.  Assessment and Plan:  Problem List Items Addressed This Visit      Endocrine   Diabetic neuropathy ([place])    Other Visit Diagnoses    Skin ulcer of right heel, limited to breakdown of skin ([place])    -  Primary   healed   Pain of right heel          -Examined patient   -Using a 15 blade mechanically debrided keratosis at the right heel no underlying opening noted previous ulceration remains healed -Continue with heel offloading cushion to tennis shoe and cane -Patient to return to office as needed or sooner if problems arise.  Landis Martins, DPM

## 2020-09-12 DIAGNOSIS — G4733 Obstructive sleep apnea (adult) (pediatric): Secondary | ICD-10-CM | POA: Diagnosis not present

## 2020-09-19 DIAGNOSIS — R0600 Dyspnea, unspecified: Secondary | ICD-10-CM | POA: Diagnosis not present

## 2020-09-19 DIAGNOSIS — E668 Other obesity: Secondary | ICD-10-CM | POA: Diagnosis not present

## 2020-09-19 DIAGNOSIS — J449 Chronic obstructive pulmonary disease, unspecified: Secondary | ICD-10-CM | POA: Diagnosis not present

## 2020-09-19 DIAGNOSIS — G4733 Obstructive sleep apnea (adult) (pediatric): Secondary | ICD-10-CM | POA: Diagnosis not present

## 2020-09-27 DIAGNOSIS — N189 Chronic kidney disease, unspecified: Secondary | ICD-10-CM | POA: Diagnosis not present

## 2020-09-27 DIAGNOSIS — N184 Chronic kidney disease, stage 4 (severe): Secondary | ICD-10-CM | POA: Diagnosis not present

## 2020-09-27 DIAGNOSIS — D631 Anemia in chronic kidney disease: Secondary | ICD-10-CM | POA: Diagnosis not present

## 2020-09-27 DIAGNOSIS — R809 Proteinuria, unspecified: Secondary | ICD-10-CM | POA: Diagnosis not present

## 2020-09-27 DIAGNOSIS — I4891 Unspecified atrial fibrillation: Secondary | ICD-10-CM | POA: Diagnosis not present

## 2020-09-27 DIAGNOSIS — I509 Heart failure, unspecified: Secondary | ICD-10-CM | POA: Diagnosis not present

## 2020-09-27 DIAGNOSIS — I129 Hypertensive chronic kidney disease with stage 1 through stage 4 chronic kidney disease, or unspecified chronic kidney disease: Secondary | ICD-10-CM | POA: Diagnosis not present

## 2020-09-27 DIAGNOSIS — G4733 Obstructive sleep apnea (adult) (pediatric): Secondary | ICD-10-CM | POA: Diagnosis not present

## 2020-09-27 DIAGNOSIS — E1122 Type 2 diabetes mellitus with diabetic chronic kidney disease: Secondary | ICD-10-CM | POA: Diagnosis not present

## 2020-09-27 DIAGNOSIS — E785 Hyperlipidemia, unspecified: Secondary | ICD-10-CM | POA: Diagnosis not present

## 2020-10-09 ENCOUNTER — Other Ambulatory Visit: Payer: Self-pay | Admitting: Cardiology

## 2020-10-10 NOTE — Telephone Encounter (Signed)
Refill sent to pharmacy.   

## 2020-11-15 DIAGNOSIS — N183 Chronic kidney disease, stage 3 unspecified: Secondary | ICD-10-CM | POA: Diagnosis not present

## 2020-11-15 DIAGNOSIS — I4891 Unspecified atrial fibrillation: Secondary | ICD-10-CM | POA: Diagnosis not present

## 2020-11-15 DIAGNOSIS — J449 Chronic obstructive pulmonary disease, unspecified: Secondary | ICD-10-CM | POA: Diagnosis not present

## 2020-11-15 DIAGNOSIS — L97909 Non-pressure chronic ulcer of unspecified part of unspecified lower leg with unspecified severity: Secondary | ICD-10-CM | POA: Diagnosis not present

## 2020-11-15 DIAGNOSIS — Z125 Encounter for screening for malignant neoplasm of prostate: Secondary | ICD-10-CM | POA: Diagnosis not present

## 2020-11-15 DIAGNOSIS — E11621 Type 2 diabetes mellitus with foot ulcer: Secondary | ICD-10-CM | POA: Diagnosis not present

## 2020-11-15 DIAGNOSIS — I10 Essential (primary) hypertension: Secondary | ICD-10-CM | POA: Diagnosis not present

## 2020-11-15 DIAGNOSIS — T300 Burn of unspecified body region, unspecified degree: Secondary | ICD-10-CM | POA: Diagnosis not present

## 2020-11-15 DIAGNOSIS — I480 Paroxysmal atrial fibrillation: Secondary | ICD-10-CM | POA: Diagnosis not present

## 2020-11-15 DIAGNOSIS — I5020 Unspecified systolic (congestive) heart failure: Secondary | ICD-10-CM | POA: Diagnosis not present

## 2020-11-15 DIAGNOSIS — E1122 Type 2 diabetes mellitus with diabetic chronic kidney disease: Secondary | ICD-10-CM | POA: Diagnosis not present

## 2020-11-15 DIAGNOSIS — Z6839 Body mass index (BMI) 39.0-39.9, adult: Secondary | ICD-10-CM | POA: Diagnosis not present

## 2020-11-15 DIAGNOSIS — E1140 Type 2 diabetes mellitus with diabetic neuropathy, unspecified: Secondary | ICD-10-CM | POA: Diagnosis not present

## 2020-11-15 DIAGNOSIS — E782 Mixed hyperlipidemia: Secondary | ICD-10-CM | POA: Diagnosis not present

## 2020-12-12 ENCOUNTER — Other Ambulatory Visit: Payer: Self-pay | Admitting: Cardiology

## 2020-12-12 DIAGNOSIS — G4733 Obstructive sleep apnea (adult) (pediatric): Secondary | ICD-10-CM | POA: Diagnosis not present

## 2020-12-13 NOTE — Telephone Encounter (Signed)
Refill sent to pharmacy.   

## 2020-12-24 NOTE — Progress Notes (Signed)
Cardiology Office Note:    Date:  12/25/2020   ID:  [name] [name], DOB [birth year], MRN [id number]  PCP:  Cyndi Bender, PA-C  Cardiologist:  Shirlee More, MD    Referring MD: Cyndi Bender, PA-C    ASSESSMENT:    1. Paroxysmal atrial fibrillation (HCC)   2. Chronic anticoagulation   3. High risk medication use   4. Hypertensive heart disease with heart failure (HCC)    PLAN:    In order of problems listed above:  Generally doing well maintaining sinus rhythm continue his antiarrhythmic drug propafenone no findings of clinical toxicity on EKG and I will give him a prescription for short acting beta-blocker he can take for episodes of rapid heart rhythm.  If more severe or sustained ambulatory event monitor would be appropriate Continue his anticoagulant Continue propafenone Stable BP at target he is on an ARB managed by nephrology.  No signs or symptoms of heart failure I do not think he needs a repeat echocardiogram   Next appointment: 1 year   Medication Adjustments/Labs and Tests Ordered: Current medicines are reviewed at length with the patient today.  Concerns regarding medicines are outlined above.  Orders Placed This Encounter  Procedures   EKG 12-Lead   No orders of the defined types were placed in this encounter.   Chief Complaint  Patient presents with   Follow-up   Atrial Fibrillation   Cardiomyopathy    History of Present Illness:    [name] [name] is a 65 y.o. male with a hx of paroxysmal atrial fibrillation previous cardiomyopathy with normalization of left ventricular function heart failure and sick sinus syndrome last seen 10/18/2019 maintaining sinus rhythm on low-dose propafenone and continuing to be anticoagulated.  His echocardiogram 04/12/2018 showed normalization of ejection fraction 60 to 65% both the left and right atrium were normal in size.  Compliance with diet, lifestyle and medications: Yes  Is followed by nephrology for CKD When he is  upset with his son he has episodes where his heart is rapid briefly but not severe sustained or frequent.  I will give him a prescription for short acting Cardizem he can take as needed. He tolerates his anticoagulant without bleeding No edema shortness of breath chest pain or syncope. Most recent labs through his PCP 02/03/2020 cholesterol 183 LDL 84 11/15/2020 A1c elevated 8.2% creatinine 1.45 Past Medical History:  Diagnosis Date   Atrial fibrillation ([place])    Benign essential HTN 07/30/2014   Bradycardia 01/25/2015   CHF (congestive heart failure) (HCC)    Chronic anticoagulation [id number]   Chronic systolic CHF (congestive heart failure), NYHA class 3 ([place]) 08/30/2014   Overview:  Global ef 30%   Class 3 severe obesity in adult ([place]) 04/28/2017   Diabetes mellitus without complication ([place])    Diabetic neuropathy ([place]) 07/28/2014   Dysrhythmia    High risk medication use 05/26/2018   Hypertension    Hypertensive heart disease with heart failure ([place]) 07/30/2014   LV dysfunction 04/28/2017   Mixed hyperlipidemia 07/28/2014   Paroxysmal atrial fibrillation ([place]) 07/28/2014   Sinus node dysfunction ([place]) 04/21/2016   Uncontrolled type 2 diabetes mellitus with microalbuminuric diabetic nephropathy ([place]) 07/28/2014    Past Surgical History:  Procedure Laterality Date   ELECTROPHYSIOLOGIC STUDY N/A 01/18/2015   Procedure: CARDIOVERSION;  Surgeon: [name], MD;  Location: [facility] ORS;  Service: Cardiovascular;  Laterality: N/A;    Current Medications: Current Meds  Medication Sig   albuterol (PROVENTIL HFA;VENTOLIN HFA) 108 (90 Base) MCG/ACT  inhaler INHALE 2 PUFFS BY MOUTH EVERY 4 TO 6 HOURS AS NEEDED   BREO ELLIPTA 100-25 MCG/INH AEPB INHALE 1 PUFF BY MOUTH ONCE DAILY   FARXIGA 10 MG TABS tablet TAKE 1 TABLET BY MOUTH ONCE DAILY FOR DIABETES   glipiZIDE (GLUCOTROL) 10 MG tablet Take 10 mg by mouth 2 (two) times daily before a meal.   losartan (COZAAR) 50 MG tablet Take 50 mg by mouth daily.    metFORMIN (GLUCOPHAGE) 1000 MG tablet Take 1,000 mg by mouth 2 (two) times daily with a meal.   propafenone (RYTHMOL) 225 MG tablet TAKE 1 TABLET TWICE DAILY   sitaGLIPtin (JANUVIA) 100 MG tablet Take 100 mg by mouth daily.   TRULICITY 1.5 0000000 SOPN Inject 1.5 mg into the skin once a week.   XARELTO 20 MG TABS tablet TAKE 1 TABLET EVERY DAY WITH SUPPER     Allergies:   Patient has no known allergies.   Social History   Socioeconomic History   Marital status: Married    Spouse name: Not on file   Number of children: Not on file   Years of education: Not on file   Highest education level: Not on file  Occupational History   Not on file  Tobacco Use   Smoking status: Former    Pack years: 0.00    Types: Cigarettes    Quit date: 01/17/2002    Years since quitting: 18.9   Smokeless tobacco: Never  Vaping Use   Vaping Use: Never used  Substance and Sexual Activity   Alcohol use: No   Drug use: Yes    Frequency: 1.0 times per week    Types: Marijuana   Sexual activity: Not on file  Other Topics Concern   Not on file  Social History Narrative   Not on file   Social Determinants of Health   Financial Resource Strain: Not on file  Food Insecurity: Not on file  Transportation Needs: Not on file  Physical Activity: Not on file  Stress: Not on file  Social Connections: Not on file     Family History: The patient's family history includes CAD in his brother and mother; Drug abuse in his brother; Lung cancer in his father. ROS:   Please see the history of present illness.    All other systems reviewed and are negative.  EKGs/Labs/Other Studies Reviewed:    The following studies were reviewed today:  EKG:  EKG ordered today and personally reviewed.  The ekg ordered today demonstrates sinus rhythm and is normal    Physical Exam:    VS:  BP 120/60 (BP Location: Right Arm, Patient Position: Sitting, Cuff Size: Large)   Pulse 82   Ht '5\' 10"'$  (1.778 m)   Wt 280 lb  (127 kg)   SpO2 97%   BMI 40.18 kg/m     Wt Readings from Last 3 Encounters:  12/25/20 280 lb (127 kg)  10/18/19 264 lb (119.7 kg)  04/21/19 269 lb (122 kg)     GEN:  Well nourished, well developed in no acute distress HEENT: Normal NECK: No JVD; No carotid bruits LYMPHATICS: No lymphadenopathy CARDIAC: RRR, no murmurs, rubs, gallops RESPIRATORY:  Clear to auscultation without rales, wheezing or rhonchi  ABDOMEN: Soft, non-tender, non-distended MUSCULOSKELETAL:  No edema; No deformity  SKIN: Warm and dry NEUROLOGIC:  Alert and oriented x 3 PSYCHIATRIC:  Normal affect    Signed, Shirlee More, MD  12/25/2020 9:35 AM    [facility]

## 2020-12-25 ENCOUNTER — Encounter: Payer: Self-pay | Admitting: Cardiology

## 2020-12-25 ENCOUNTER — Other Ambulatory Visit: Payer: Self-pay

## 2020-12-25 ENCOUNTER — Ambulatory Visit (INDEPENDENT_AMBULATORY_CARE_PROVIDER_SITE_OTHER): Payer: Medicare HMO | Admitting: Cardiology

## 2020-12-25 VITALS — BP 120/60 | HR 82 | Ht 70.0 in | Wt 280.0 lb

## 2020-12-25 DIAGNOSIS — Z7901 Long term (current) use of anticoagulants: Secondary | ICD-10-CM

## 2020-12-25 DIAGNOSIS — Z79899 Other long term (current) drug therapy: Secondary | ICD-10-CM | POA: Diagnosis not present

## 2020-12-25 DIAGNOSIS — Z6841 Body Mass Index (BMI) 40.0 and over, adult: Secondary | ICD-10-CM | POA: Diagnosis not present

## 2020-12-25 DIAGNOSIS — I110 Hypertensive heart disease with heart failure: Secondary | ICD-10-CM

## 2020-12-25 DIAGNOSIS — I480 Paroxysmal atrial fibrillation: Secondary | ICD-10-CM | POA: Diagnosis not present

## 2020-12-25 MED ORDER — DILTIAZEM HCL 30 MG PO TABS: 30.0000 mg | ORAL_TABLET | Freq: Four times a day (QID) | ORAL | 3 refills | Status: DC | PRN

## 2020-12-25 NOTE — Patient Instructions (Signed)
Medication Instructions:  Your physician has recommended you make the following change in your medication:  START: Cardizem 30 mg take one tablet by mouth every 6 hours as needed if your heart rate is greater than 120 beats per minute.  *If you need a refill on your cardiac medications before your next appointment, please call your pharmacy*   Lab Work: None If you have labs (blood work) drawn today and your tests are completely normal, you will receive your results only by: [place] (if you have MyChart) OR A paper copy in the mail If you have any lab test that is abnormal or we need to change your treatment, we will call you to review the results.   Testing/Procedures: None   Follow-Up: At [facility], you and your health needs are our priority.  As part of our continuing mission to provide you with exceptional heart care, we have created designated Provider Care Teams.  These Care Teams include your primary Cardiologist (physician) and Advanced Practice Providers (APPs -  Physician Assistants and Nurse Practitioners) who all work together to provide you with the care you need, when you need it.  We recommend signing up for the patient portal called "MyChart".  Sign up information is provided on this After Visit Summary.  MyChart is used to connect with patients for Virtual Visits (Telemedicine).  Patients are able to view lab/test results, encounter notes, upcoming appointments, etc.  Non-urgent messages can be sent to your provider as well.   To learn more about what you can do with MyChart, go to [email or web address].    Your next appointment:   1 year(s)  The format for your next appointment:   In Person  Provider:   Shirlee More, MD   Other Instructions

## 2020-12-25 NOTE — Addendum Note (Signed)
Addended by: Resa Miner I on: 12/25/2020 09:39 AM   Modules accepted: Orders

## 2021-01-03 DIAGNOSIS — Z0000 Encounter for general adult medical examination without abnormal findings: Secondary | ICD-10-CM | POA: Diagnosis not present

## 2021-01-03 DIAGNOSIS — E785 Hyperlipidemia, unspecified: Secondary | ICD-10-CM | POA: Diagnosis not present

## 2021-01-03 DIAGNOSIS — Z1331 Encounter for screening for depression: Secondary | ICD-10-CM | POA: Diagnosis not present

## 2021-01-03 DIAGNOSIS — Z9181 History of falling: Secondary | ICD-10-CM | POA: Diagnosis not present

## 2021-01-03 DIAGNOSIS — E669 Obesity, unspecified: Secondary | ICD-10-CM | POA: Diagnosis not present

## 2021-01-08 DIAGNOSIS — J449 Chronic obstructive pulmonary disease, unspecified: Secondary | ICD-10-CM | POA: Diagnosis not present

## 2021-01-08 DIAGNOSIS — G4733 Obstructive sleep apnea (adult) (pediatric): Secondary | ICD-10-CM | POA: Diagnosis not present

## 2021-01-08 DIAGNOSIS — R0600 Dyspnea, unspecified: Secondary | ICD-10-CM | POA: Diagnosis not present

## 2021-01-08 DIAGNOSIS — E668 Other obesity: Secondary | ICD-10-CM | POA: Diagnosis not present

## 2021-01-15 DIAGNOSIS — H40063 Primary angle closure without glaucoma damage, bilateral: Secondary | ICD-10-CM | POA: Diagnosis not present

## 2021-01-15 DIAGNOSIS — H2513 Age-related nuclear cataract, bilateral: Secondary | ICD-10-CM | POA: Diagnosis not present

## 2021-02-04 DIAGNOSIS — E1140 Type 2 diabetes mellitus with diabetic neuropathy, unspecified: Secondary | ICD-10-CM | POA: Diagnosis not present

## 2021-02-04 DIAGNOSIS — I5020 Unspecified systolic (congestive) heart failure: Secondary | ICD-10-CM | POA: Diagnosis not present

## 2021-02-04 DIAGNOSIS — Z6839 Body mass index (BMI) 39.0-39.9, adult: Secondary | ICD-10-CM | POA: Diagnosis not present

## 2021-02-04 DIAGNOSIS — Z01818 Encounter for other preprocedural examination: Secondary | ICD-10-CM | POA: Diagnosis not present

## 2021-02-04 DIAGNOSIS — H269 Unspecified cataract: Secondary | ICD-10-CM | POA: Diagnosis not present

## 2021-02-04 DIAGNOSIS — E782 Mixed hyperlipidemia: Secondary | ICD-10-CM | POA: Diagnosis not present

## 2021-02-04 DIAGNOSIS — I10 Essential (primary) hypertension: Secondary | ICD-10-CM | POA: Diagnosis not present

## 2021-02-04 DIAGNOSIS — I4891 Unspecified atrial fibrillation: Secondary | ICD-10-CM | POA: Diagnosis not present

## 2021-02-04 DIAGNOSIS — N183 Chronic kidney disease, stage 3 unspecified: Secondary | ICD-10-CM | POA: Diagnosis not present

## 2021-02-11 DIAGNOSIS — Z7901 Long term (current) use of anticoagulants: Secondary | ICD-10-CM | POA: Diagnosis not present

## 2021-02-11 DIAGNOSIS — E1142 Type 2 diabetes mellitus with diabetic polyneuropathy: Secondary | ICD-10-CM | POA: Diagnosis not present

## 2021-02-11 DIAGNOSIS — E785 Hyperlipidemia, unspecified: Secondary | ICD-10-CM | POA: Diagnosis not present

## 2021-02-11 DIAGNOSIS — I480 Paroxysmal atrial fibrillation: Secondary | ICD-10-CM | POA: Diagnosis not present

## 2021-02-11 DIAGNOSIS — Z7984 Long term (current) use of oral hypoglycemic drugs: Secondary | ICD-10-CM | POA: Diagnosis not present

## 2021-02-11 DIAGNOSIS — I10 Essential (primary) hypertension: Secondary | ICD-10-CM | POA: Diagnosis not present

## 2021-02-11 DIAGNOSIS — E669 Obesity, unspecified: Secondary | ICD-10-CM | POA: Diagnosis not present

## 2021-02-11 DIAGNOSIS — H2512 Age-related nuclear cataract, left eye: Secondary | ICD-10-CM | POA: Diagnosis not present

## 2021-02-11 DIAGNOSIS — E1136 Type 2 diabetes mellitus with diabetic cataract: Secondary | ICD-10-CM | POA: Diagnosis not present

## 2021-03-04 DIAGNOSIS — E1136 Type 2 diabetes mellitus with diabetic cataract: Secondary | ICD-10-CM | POA: Diagnosis not present

## 2021-03-04 DIAGNOSIS — I10 Essential (primary) hypertension: Secondary | ICD-10-CM | POA: Diagnosis not present

## 2021-03-04 DIAGNOSIS — E785 Hyperlipidemia, unspecified: Secondary | ICD-10-CM | POA: Diagnosis not present

## 2021-03-04 DIAGNOSIS — Z7984 Long term (current) use of oral hypoglycemic drugs: Secondary | ICD-10-CM | POA: Diagnosis not present

## 2021-03-04 DIAGNOSIS — E291 Testicular hypofunction: Secondary | ICD-10-CM | POA: Diagnosis not present

## 2021-03-04 DIAGNOSIS — H2511 Age-related nuclear cataract, right eye: Secondary | ICD-10-CM | POA: Diagnosis not present

## 2021-03-04 DIAGNOSIS — Z7901 Long term (current) use of anticoagulants: Secondary | ICD-10-CM | POA: Diagnosis not present

## 2021-03-04 DIAGNOSIS — E1142 Type 2 diabetes mellitus with diabetic polyneuropathy: Secondary | ICD-10-CM | POA: Diagnosis not present

## 2021-03-04 DIAGNOSIS — I4891 Unspecified atrial fibrillation: Secondary | ICD-10-CM | POA: Diagnosis not present

## 2021-03-08 DIAGNOSIS — E1122 Type 2 diabetes mellitus with diabetic chronic kidney disease: Secondary | ICD-10-CM | POA: Diagnosis not present

## 2021-03-08 DIAGNOSIS — E785 Hyperlipidemia, unspecified: Secondary | ICD-10-CM | POA: Diagnosis not present

## 2021-03-08 DIAGNOSIS — N189 Chronic kidney disease, unspecified: Secondary | ICD-10-CM | POA: Diagnosis not present

## 2021-03-08 DIAGNOSIS — I4891 Unspecified atrial fibrillation: Secondary | ICD-10-CM | POA: Diagnosis not present

## 2021-03-08 DIAGNOSIS — D631 Anemia in chronic kidney disease: Secondary | ICD-10-CM | POA: Diagnosis not present

## 2021-03-08 DIAGNOSIS — I129 Hypertensive chronic kidney disease with stage 1 through stage 4 chronic kidney disease, or unspecified chronic kidney disease: Secondary | ICD-10-CM | POA: Diagnosis not present

## 2021-03-08 DIAGNOSIS — I509 Heart failure, unspecified: Secondary | ICD-10-CM | POA: Diagnosis not present

## 2021-03-08 DIAGNOSIS — R809 Proteinuria, unspecified: Secondary | ICD-10-CM | POA: Diagnosis not present

## 2021-03-08 DIAGNOSIS — G4733 Obstructive sleep apnea (adult) (pediatric): Secondary | ICD-10-CM | POA: Diagnosis not present

## 2021-03-08 DIAGNOSIS — N184 Chronic kidney disease, stage 4 (severe): Secondary | ICD-10-CM | POA: Diagnosis not present

## 2021-03-15 DIAGNOSIS — G4733 Obstructive sleep apnea (adult) (pediatric): Secondary | ICD-10-CM | POA: Diagnosis not present

## 2021-04-08 DIAGNOSIS — J449 Chronic obstructive pulmonary disease, unspecified: Secondary | ICD-10-CM | POA: Diagnosis not present

## 2021-04-08 DIAGNOSIS — R0600 Dyspnea, unspecified: Secondary | ICD-10-CM | POA: Diagnosis not present

## 2021-04-08 DIAGNOSIS — G4733 Obstructive sleep apnea (adult) (pediatric): Secondary | ICD-10-CM | POA: Diagnosis not present

## 2021-04-08 DIAGNOSIS — E668 Other obesity: Secondary | ICD-10-CM | POA: Diagnosis not present

## 2021-04-29 ENCOUNTER — Other Ambulatory Visit: Payer: Self-pay | Admitting: Cardiology

## 2021-05-08 DIAGNOSIS — I5020 Unspecified systolic (congestive) heart failure: Secondary | ICD-10-CM | POA: Diagnosis not present

## 2021-05-08 DIAGNOSIS — N183 Chronic kidney disease, stage 3 unspecified: Secondary | ICD-10-CM | POA: Diagnosis not present

## 2021-05-08 DIAGNOSIS — I4891 Unspecified atrial fibrillation: Secondary | ICD-10-CM | POA: Diagnosis not present

## 2021-05-08 DIAGNOSIS — J449 Chronic obstructive pulmonary disease, unspecified: Secondary | ICD-10-CM | POA: Diagnosis not present

## 2021-05-08 DIAGNOSIS — Z6839 Body mass index (BMI) 39.0-39.9, adult: Secondary | ICD-10-CM | POA: Diagnosis not present

## 2021-05-08 DIAGNOSIS — I10 Essential (primary) hypertension: Secondary | ICD-10-CM | POA: Diagnosis not present

## 2021-05-08 DIAGNOSIS — E782 Mixed hyperlipidemia: Secondary | ICD-10-CM | POA: Diagnosis not present

## 2021-05-08 DIAGNOSIS — E1140 Type 2 diabetes mellitus with diabetic neuropathy, unspecified: Secondary | ICD-10-CM | POA: Diagnosis not present

## 2021-05-24 ENCOUNTER — Encounter: Payer: Self-pay | Admitting: Cardiology

## 2021-07-02 DIAGNOSIS — N189 Chronic kidney disease, unspecified: Secondary | ICD-10-CM | POA: Diagnosis not present

## 2021-07-02 DIAGNOSIS — D631 Anemia in chronic kidney disease: Secondary | ICD-10-CM | POA: Diagnosis not present

## 2021-07-02 DIAGNOSIS — I509 Heart failure, unspecified: Secondary | ICD-10-CM | POA: Diagnosis not present

## 2021-07-02 DIAGNOSIS — R809 Proteinuria, unspecified: Secondary | ICD-10-CM | POA: Diagnosis not present

## 2021-07-02 DIAGNOSIS — E785 Hyperlipidemia, unspecified: Secondary | ICD-10-CM | POA: Diagnosis not present

## 2021-07-02 DIAGNOSIS — I4891 Unspecified atrial fibrillation: Secondary | ICD-10-CM | POA: Diagnosis not present

## 2021-07-02 DIAGNOSIS — N184 Chronic kidney disease, stage 4 (severe): Secondary | ICD-10-CM | POA: Diagnosis not present

## 2021-07-02 DIAGNOSIS — I129 Hypertensive chronic kidney disease with stage 1 through stage 4 chronic kidney disease, or unspecified chronic kidney disease: Secondary | ICD-10-CM | POA: Diagnosis not present

## 2021-07-02 DIAGNOSIS — E1122 Type 2 diabetes mellitus with diabetic chronic kidney disease: Secondary | ICD-10-CM | POA: Diagnosis not present

## 2021-07-02 DIAGNOSIS — G4733 Obstructive sleep apnea (adult) (pediatric): Secondary | ICD-10-CM | POA: Diagnosis not present

## 2021-07-03 DIAGNOSIS — Z0100 Encounter for examination of eyes and vision without abnormal findings: Secondary | ICD-10-CM | POA: Diagnosis not present

## 2021-07-09 DIAGNOSIS — R0600 Dyspnea, unspecified: Secondary | ICD-10-CM | POA: Diagnosis not present

## 2021-07-09 DIAGNOSIS — J4540 Moderate persistent asthma, uncomplicated: Secondary | ICD-10-CM | POA: Diagnosis not present

## 2021-07-09 DIAGNOSIS — G4733 Obstructive sleep apnea (adult) (pediatric): Secondary | ICD-10-CM | POA: Diagnosis not present

## 2021-07-09 DIAGNOSIS — E668 Other obesity: Secondary | ICD-10-CM | POA: Diagnosis not present

## 2021-07-15 DIAGNOSIS — E291 Testicular hypofunction: Secondary | ICD-10-CM | POA: Insufficient documentation

## 2021-07-15 DIAGNOSIS — I5032 Chronic diastolic (congestive) heart failure: Secondary | ICD-10-CM | POA: Insufficient documentation

## 2021-07-15 DIAGNOSIS — G4733 Obstructive sleep apnea (adult) (pediatric): Secondary | ICD-10-CM | POA: Insufficient documentation

## 2021-07-15 DIAGNOSIS — I499 Cardiac arrhythmia, unspecified: Secondary | ICD-10-CM | POA: Insufficient documentation

## 2021-07-15 DIAGNOSIS — M5489 Other dorsalgia: Secondary | ICD-10-CM | POA: Insufficient documentation

## 2021-07-15 DIAGNOSIS — I509 Heart failure, unspecified: Secondary | ICD-10-CM | POA: Insufficient documentation

## 2021-07-15 DIAGNOSIS — M545 Low back pain, unspecified: Secondary | ICD-10-CM | POA: Insufficient documentation

## 2021-07-15 DIAGNOSIS — N529 Male erectile dysfunction, unspecified: Secondary | ICD-10-CM | POA: Insufficient documentation

## 2021-07-15 DIAGNOSIS — E119 Type 2 diabetes mellitus without complications: Secondary | ICD-10-CM | POA: Insufficient documentation

## 2021-07-15 HISTORY — DX: Chronic diastolic (congestive) heart failure: I50.32

## 2021-07-22 NOTE — Progress Notes (Signed)
Cardiology Office Note:    Date:  07/23/2021   ID:  [name] [name], DOB 29-Nov-1955, MRN [id number]  PCP:  Cyndi Bender, PA-C  Cardiologist:  Shirlee More, MD    Referring MD: Cyndi Bender, PA-C    ASSESSMENT:    1. Persistent atrial fibrillation ([place])   2. Chronic anticoagulation   3. High risk medication use   4. Hypertensive heart disease with heart failure ([place])   5. Obstructive sleep apnea    PLAN:    In order of problems listed above:  Unfortunately he has been in atrial fibrillation now for about 2 months rate is controlled for the time being I will leave him on propafenone I think he is best served by referral to EP and consideration of catheter ablation especially given his previous cardiomyopathy and heart failure Continue his anticoagulant Restart loop diuretic furosemide 20 mg daily continue his ARB Continue CPAP Stable diabetes managed by his PCP and metformin and GLP-1 agonist and SGLT2 inhibitor along with Januvia Continue with statin   Next appointment: 6 months with me will be referred to EP   Medication Adjustments/Labs and Tests Ordered: Current medicines are reviewed at length with the patient today.  Concerns regarding medicines are outlined above.  Orders Placed This Encounter  Procedures   Ambulatory referral to Cardiac Electrophysiology   EKG 12-Lead   ECHOCARDIOGRAM COMPLETE   Meds ordered this encounter  Medications   DISCONTD: furosemide (LASIX) 20 MG tablet    Sig: Take 1 tablet (20 mg total) by mouth daily.    Dispense:  90 tablet    Refill:  3   furosemide (LASIX) 20 MG tablet    Sig: Take 1 tablet (20 mg total) by mouth daily.    Dispense:  90 tablet    Refill:  3    Chief Complaint  Patient presents with   BP and HR follow up    Seem to be doing better   Atrial Fibrillation   Cardiomyopathy   Anticoagulation    History of Present Illness:    [name] [name] is a 66 y.o. male with a hx of paroxysmal atrial fibrillation  previous cardiomyopathy with normalization of left ventricular function heart failure CKD and sick sinus syndrome maintaining sinus rhythm on low-dose propafenone and continuing to be anticoagulated.  His echocardiogram 04/12/2018 showed normalization of ejection fraction 60 to 65% both the left and right atrium were normal in size. He was last seen 12/25/2020.  Compliance with diet, lifestyle and medications: Yes  Has been in atrial fibrillation since he saw his PCP 05/09/2021 His calcium channel blocker was discontinued His weight is up?  10 pounds he finds himself more short of breath with activity as well as on wearing a 2-3+ edema and is using CPAP at nighttime He has a background history of previous cardiomyopathy in the setting of atrial fibrillation He remains in atrial fibrillation today We will continue his current anticoagulant and for now continue propafenone is helping with rate control I think he is best served by referral to EP for catheter ablation we will recheck echocardiogram I will put him on a loop diuretic. He is not having palpitations syncope chest pain and has had no clinical bleeding Past Medical History:  Diagnosis Date   Atrial fibrillation ([place])    Benign essential HTN 07/30/2014   Bradycardia 01/25/2015   CHF (congestive heart failure) (HCC)    Chronic anticoagulation 68/34/1962   Chronic systolic CHF (congestive heart failure), NYHA class 3 ([place])  08/30/2014   Overview:  Global ef 30%   Class 3 severe obesity in adult ([place]) 04/28/2017   Diabetes mellitus without complication ([place])    Diabetic neuropathy ([place]) 07/28/2014   Dysrhythmia    Erectile dysfunction    High risk medication use 05/26/2018   Hypertension    Hypertensive heart disease with heart failure ([place]) 07/30/2014   Lumbago    LV dysfunction 04/28/2017   Mixed hyperlipidemia 07/28/2014   OSA (obstructive sleep apnea)    CPAP   Paroxysmal atrial fibrillation ([place]) 07/28/2014   Sinus node  dysfunction ([place]) 04/21/2016   Testicular hypofunction    Uncontrolled type 2 diabetes mellitus with microalbuminuric diabetic nephropathy 07/28/2014    Past Surgical History:  Procedure Laterality Date   ELECTROPHYSIOLOGIC STUDY N/A 01/18/2015   Procedure: CARDIOVERSION;  Surgeon: [name], MD;  Location: [facility] ORS;  Service: Cardiovascular;  Laterality: N/A;    Current Medications: Current Meds  Medication Sig   albuterol (VENTOLIN HFA) 108 (90 Base) MCG/ACT inhaler Inhale 2 puffs into the lungs every 6 (six) hours as needed for wheezing or shortness of breath.   BREO ELLIPTA 100-25 MCG/INH AEPB Inhale 1 puff into the lungs daily.   FARXIGA 10 MG TABS tablet Take 10 mg by mouth daily.   glipiZIDE (GLUCOTROL) 10 MG tablet Take 10 mg by mouth 2 (two) times daily before a meal.   losartan (COZAAR) 50 MG tablet Take 50 mg by mouth daily.   metFORMIN (GLUCOPHAGE) 1000 MG tablet Take 1,000 mg by mouth 2 (two) times daily with a meal.   propafenone (RYTHMOL) 225 MG tablet TAKE 1 TABLET TWICE DAILY (Patient taking differently: Take 225 mg by mouth 2 (two) times daily.)   rivaroxaban (XARELTO) 20 MG TABS tablet Take 20 mg by mouth daily with supper.   sitaGLIPtin (JANUVIA) 100 MG tablet Take 100 mg by mouth daily.   TRULICITY 1.5 FT/7.3UK SOPN Inject 1.5 mg into the skin once a week.   [DISCONTINUED] furosemide (LASIX) 20 MG tablet Take 1 tablet (20 mg total) by mouth daily.     Allergies:   Patient has no known allergies.   Social History   Socioeconomic History   Marital status: Married    Spouse name: Not on file   Number of children: Not on file   Years of education: Not on file   Highest education level: Not on file  Occupational History   Not on file  Tobacco Use   Smoking status: Former    Types: Cigarettes    Quit date: 2007    Years since quitting: 16.0   Smokeless tobacco: Never  Vaping Use   Vaping Use: Never used  Substance and Sexual Activity   Alcohol use:  No   Drug use: Yes    Frequency: 1.0 times per week    Types: Marijuana   Sexual activity: Not on file  Other Topics Concern   Not on file  Social History Narrative   Not on file   Social Determinants of Health   Financial Resource Strain: Not on file  Food Insecurity: Not on file  Transportation Needs: Not on file  Physical Activity: Not on file  Stress: Not on file  Social Connections: Not on file     Family History: The patient's family history includes CAD in his brother and mother; Drug abuse in his brother; Lung cancer in his father. ROS:   Please see the history of present illness.    All other systems  reviewed and are negative.  EKGs/Labs/Other Studies Reviewed:    The following studies were reviewed today:  EKG:  EKG ordered today and personally reviewed.  The ekg ordered today demonstrates atrial fibrillation rate controlled  Recent Labs: 05/09/2021 creatinine 1.6 GFR 49 cc potassium 5.1 hemoglobin 14.2  Physical Exam:    VS:  BP 132/74 (BP Location: Right Arm, Patient Position: Sitting)    Pulse 99    Ht 5' 10.5" (1.791 m)    Wt 284 lb 6.4 oz (129 kg)    SpO2 95%    BMI 40.23 kg/m     Wt Readings from Last 3 Encounters:  07/23/21 284 lb 6.4 oz (129 kg)  12/25/20 280 lb (127 kg)  10/18/19 264 lb (119.7 kg)     GEN: Obese BMI greater than 40 certainly has a sleep apnea appearance thick neck well nourished, well developed in no acute distress HEENT: Normal NECK: No JVD; No carotid bruits LYMPHATICS: No lymphadenopathy CARDIAC: Irregular rhythm controlled rate  no murmurs, rubs, gallops RESPIRATORY:  Clear to auscultation without rales, wheezing or rhonchi  ABDOMEN: Soft, non-tender, non-distended MUSCULOSKELETAL: 2-3+ bilateral ankle to knee pitting edema; No deformity  SKIN: Warm and dry NEUROLOGIC:  Alert and oriented x 3 PSYCHIATRIC:  Normal affect    Signed, Shirlee More, MD  07/23/2021 4:31 PM    [facility]

## 2021-07-23 ENCOUNTER — Other Ambulatory Visit: Payer: Self-pay

## 2021-07-23 ENCOUNTER — Ambulatory Visit (INDEPENDENT_AMBULATORY_CARE_PROVIDER_SITE_OTHER): Payer: Medicare HMO | Admitting: Cardiology

## 2021-07-23 ENCOUNTER — Encounter: Payer: Self-pay | Admitting: Cardiology

## 2021-07-23 VITALS — BP 132/74 | HR 99 | Ht 70.5 in | Wt 284.4 lb

## 2021-07-23 DIAGNOSIS — I110 Hypertensive heart disease with heart failure: Secondary | ICD-10-CM

## 2021-07-23 DIAGNOSIS — G4733 Obstructive sleep apnea (adult) (pediatric): Secondary | ICD-10-CM

## 2021-07-23 DIAGNOSIS — I4819 Other persistent atrial fibrillation: Secondary | ICD-10-CM | POA: Diagnosis not present

## 2021-07-23 DIAGNOSIS — Z7901 Long term (current) use of anticoagulants: Secondary | ICD-10-CM

## 2021-07-23 DIAGNOSIS — Z79899 Other long term (current) drug therapy: Secondary | ICD-10-CM

## 2021-07-23 MED ORDER — FUROSEMIDE 20 MG PO TABS: 20.0000 mg | ORAL_TABLET | Freq: Every day | ORAL | 3 refills | Status: DC

## 2021-07-23 NOTE — Patient Instructions (Addendum)
Medication Instructions:  Your physician has recommended you make the following change in your medication:  START: Furosemide 20 mg take one tablet by mouth daily.   *If you need a refill on your cardiac medications before your next appointment, please call your pharmacy*   Lab Work: None If you have labs (blood work) drawn today and your tests are completely normal, you will receive your results only by: [place] (if you have MyChart) OR A paper copy in the mail If you have any lab test that is abnormal or we need to change your treatment, we will call you to review the results.   Testing/Procedures: Your physician has requested that you have an echocardiogram. Echocardiography is a painless test that uses sound waves to create images of your heart. It provides your doctor with information about the size and shape of your heart and how well your hearts chambers and valves are working. This procedure takes approximately one hour. There are no restrictions for this procedure.    Follow-Up: At [facility], you and your health needs are our priority.  As part of our continuing mission to provide you with exceptional heart care, we have created designated Provider Care Teams.  These Care Teams include your primary Cardiologist (physician) and Advanced Practice Providers (APPs -  Physician Assistants and Nurse Practitioners) who all work together to provide you with the care you need, when you need it.  We recommend signing up for the patient portal called "MyChart".  Sign up information is provided on this After Visit Summary.  MyChart is used to connect with patients for Virtual Visits (Telemedicine).  Patients are able to view lab/test results, encounter notes, upcoming appointments, etc.  Non-urgent messages can be sent to your provider as well.   To learn more about what you can do with MyChart, go to [email or web address].    Your next appointment:   6 month(s)  The format  for your next appointment:   In Person  Provider:   Shirlee More, MD    Other Instructions

## 2021-07-30 ENCOUNTER — Ambulatory Visit (INDEPENDENT_AMBULATORY_CARE_PROVIDER_SITE_OTHER): Payer: Medicare HMO

## 2021-07-30 ENCOUNTER — Other Ambulatory Visit: Payer: Self-pay

## 2021-07-30 DIAGNOSIS — I4819 Other persistent atrial fibrillation: Secondary | ICD-10-CM | POA: Diagnosis not present

## 2021-07-30 LAB — ECHOCARDIOGRAM COMPLETE
AV Mean grad: 5 mmHg
AV Peak grad: 8.4 mmHg
Ao pk vel: 1.45 m/s
Area-P 1/2: 6.54 cm2
S' Lateral: 3.1 cm

## 2021-07-30 MED ORDER — PERFLUTREN LIPID MICROSPHERE
1.0000 mL | INTRAVENOUS | Status: AC | PRN
  Administered 2021-07-30: 6 mL via INTRAVENOUS

## 2021-07-31 ENCOUNTER — Telehealth: Payer: Self-pay

## 2021-07-31 NOTE — Telephone Encounter (Signed)
-----   Message from Richardo Priest, MD sent at 07/31/2021 12:42 PM EST ----- Heart muscle function remains normal

## 2021-07-31 NOTE — Telephone Encounter (Signed)
Left message on patients voicemail to please return our call.   

## 2021-08-08 DIAGNOSIS — Z139 Encounter for screening, unspecified: Secondary | ICD-10-CM | POA: Diagnosis not present

## 2021-08-08 DIAGNOSIS — J449 Chronic obstructive pulmonary disease, unspecified: Secondary | ICD-10-CM | POA: Diagnosis not present

## 2021-08-08 DIAGNOSIS — Z6841 Body Mass Index (BMI) 40.0 and over, adult: Secondary | ICD-10-CM | POA: Diagnosis not present

## 2021-08-08 DIAGNOSIS — I4891 Unspecified atrial fibrillation: Secondary | ICD-10-CM | POA: Diagnosis not present

## 2021-08-08 DIAGNOSIS — E782 Mixed hyperlipidemia: Secondary | ICD-10-CM | POA: Diagnosis not present

## 2021-08-08 DIAGNOSIS — I10 Essential (primary) hypertension: Secondary | ICD-10-CM | POA: Diagnosis not present

## 2021-08-08 DIAGNOSIS — I5020 Unspecified systolic (congestive) heart failure: Secondary | ICD-10-CM | POA: Diagnosis not present

## 2021-08-08 DIAGNOSIS — E1140 Type 2 diabetes mellitus with diabetic neuropathy, unspecified: Secondary | ICD-10-CM | POA: Diagnosis not present

## 2021-08-08 DIAGNOSIS — N183 Chronic kidney disease, stage 3 unspecified: Secondary | ICD-10-CM | POA: Diagnosis not present

## 2021-08-13 ENCOUNTER — Other Ambulatory Visit: Payer: Self-pay

## 2021-08-13 ENCOUNTER — Ambulatory Visit (INDEPENDENT_AMBULATORY_CARE_PROVIDER_SITE_OTHER): Payer: Medicare HMO | Admitting: Cardiology

## 2021-08-13 ENCOUNTER — Encounter: Payer: Self-pay | Admitting: Cardiology

## 2021-08-13 VITALS — BP 118/70 | HR 89 | Ht 70.5 in | Wt 283.3 lb

## 2021-08-13 DIAGNOSIS — I4819 Other persistent atrial fibrillation: Secondary | ICD-10-CM

## 2021-08-13 DIAGNOSIS — Z01818 Encounter for other preprocedural examination: Secondary | ICD-10-CM | POA: Diagnosis not present

## 2021-08-13 DIAGNOSIS — G4733 Obstructive sleep apnea (adult) (pediatric): Secondary | ICD-10-CM

## 2021-08-13 DIAGNOSIS — Z01812 Encounter for preprocedural laboratory examination: Secondary | ICD-10-CM

## 2021-08-13 MED ORDER — METOPROLOL TARTRATE 50 MG PO TABS: 50.0000 mg | ORAL_TABLET | Freq: Once | ORAL | 0 refills | Status: DC

## 2021-08-13 NOTE — Progress Notes (Signed)
Electrophysiology Office Note   Date:  08/13/2021   ID:  [name], [name] 07-30-1955, MRN [id number]  PCP:  Cyndi Bender, PA-C  Cardiologist:  Bettina Gavia Primary Electrophysiologist:  Anab Vivar Meredith Leeds, MD    Chief Complaint: AF   History of Present Illness: [name] [name] is a 66 y.o. male who is being seen today for the evaluation of AF at the request of Bettina Gavia, Hilton Cork, MD. Presenting today for electrophysiology evaluation.  He has a history significant for atrial fibrillation, prior cardiomyopathy with normalization of ejection fraction, CKD, sick sinus syndrome.  He is currently on propafenone.  He was found to be in atrial fibrillation 05/09/2021.  Today, he denies symptoms of palpitations, chest pain, orthopnea, PND, lower extremity edema, claudication, dizziness, presyncope, syncope, bleeding, or neurologic sequela. The patient is tolerating medications without difficulties.  His main symptoms are shortness of breath and fatigue.  He feels quite poorly in atrial fibrillation and would like to get back into normal rhythm.  He is unclear as to when he went into atrial fibrillation.  Fortunately on his most recent echo, his ejection fraction has remained normal and his left atrium is normal in size.   Past Medical History:  Diagnosis Date   Atrial fibrillation ([place])    Benign essential HTN 07/30/2014   Bradycardia 01/25/2015   CHF (congestive heart failure) (HCC)    Chronic anticoagulation 38/25/0539   Chronic systolic CHF (congestive heart failure), NYHA class 3 ([place]) 08/30/2014   Overview:  Global ef 30%   Class 3 severe obesity in adult ([place]) 04/28/2017   Diabetes mellitus without complication ([place])    Diabetic neuropathy ([place]) 07/28/2014   Dysrhythmia    Erectile dysfunction    High risk medication use 05/26/2018   Hypertension    Hypertensive heart disease with heart failure ([place]) 07/30/2014   Lumbago    LV dysfunction 04/28/2017   Mixed hyperlipidemia 07/28/2014    OSA (obstructive sleep apnea)    CPAP   Paroxysmal atrial fibrillation ([place]) 07/28/2014   Sinus node dysfunction ([place]) 04/21/2016   Testicular hypofunction    Uncontrolled type 2 diabetes mellitus with microalbuminuric diabetic nephropathy 07/28/2014   Past Surgical History:  Procedure Laterality Date   ELECTROPHYSIOLOGIC STUDY N/A 01/18/2015   Procedure: CARDIOVERSION;  Surgeon: [name], MD;  Location: [facility] ORS;  Service: Cardiovascular;  Laterality: N/A;     Current Outpatient Medications  Medication Sig Dispense Refill   albuterol (VENTOLIN HFA) 108 (90 Base) MCG/ACT inhaler Inhale 2 puffs into the lungs every 6 (six) hours as needed for wheezing or shortness of breath.     BREO ELLIPTA 100-25 MCG/INH AEPB Inhale 1 puff into the lungs daily.  3   FARXIGA 10 MG TABS tablet Take 10 mg by mouth daily.     furosemide (LASIX) 20 MG tablet Take 1 tablet (20 mg total) by mouth daily. 90 tablet 3   glipiZIDE (GLUCOTROL) 10 MG tablet Take 10 mg by mouth 2 (two) times daily before a meal.     losartan (COZAAR) 50 MG tablet Take 50 mg by mouth daily.     metFORMIN (GLUCOPHAGE) 1000 MG tablet Take 1,000 mg by mouth 2 (two) times daily with a meal.     metoprolol tartrate (LOPRESSOR) 50 MG tablet Take 1 tablet (50 mg total) by mouth once for 1 dose. Take 90-120 minutes prior to scan. 1 tablet 0   propafenone (RYTHMOL) 225 MG tablet TAKE 1 TABLET TWICE DAILY (Patient taking differently: Take 225  mg by mouth 2 (two) times daily.) 180 tablet 0   rivaroxaban (XARELTO) 20 MG TABS tablet Take 20 mg by mouth daily with supper.     rosuvastatin (CRESTOR) 20 MG tablet Take 20 mg by mouth daily.     sitaGLIPtin (JANUVIA) 100 MG tablet Take 100 mg by mouth daily.     TRULICITY 1.5 PX/1.0GY SOPN Inject 1.5 mg into the skin once a week.     No current facility-administered medications for this visit.    Allergies:   Patient has no known allergies.   Social History:  The patient  reports that  he quit smoking about 16 years ago. His smoking use included cigarettes. He has never used smokeless tobacco. He reports current drug use. Frequency: 1.00 time per week. Drug: Marijuana. He reports that he does not drink alcohol.   Family History:  The patient's family history includes CAD in his brother and mother; Drug abuse in his brother; Lung cancer in his father.    ROS:  Please see the history of present illness.   Otherwise, review of systems is positive for none.   All other systems are reviewed and negative.    PHYSICAL EXAM: VS:  BP 118/70    Pulse 89    Ht 5' 10.5" (1.791 m)    Wt 283 lb 4.8 oz (128.5 kg)    SpO2 98%    BMI 40.07 kg/m  , BMI Body mass index is 40.07 kg/m. GEN: Well nourished, well developed, in no acute distress  HEENT: normal  Neck: no JVD, carotid bruits, or masses Cardiac: irregular; no murmurs, rubs, or gallops,no edema  Respiratory:  clear to auscultation bilaterally, normal work of breathing GI: soft, nontender, nondistended, + BS MS: no deformity or atrophy  Skin: warm and dry Neuro:  Strength and sensation are intact Psych: euthymic mood, full affect  EKG:  EKG is not ordered today. Personal review of the ekg ordered 07/24/21 shows AF  Recent Labs: No results found for requested labs within last 8760 hours.    Lipid Panel  No results found for: CHOL, TRIG, HDL, CHOLHDL, VLDL, LDLCALC, LDLDIRECT   Wt Readings from Last 3 Encounters:  08/13/21 283 lb 4.8 oz (128.5 kg)  07/23/21 284 lb 6.4 oz (129 kg)  12/25/20 280 lb (127 kg)      Other studies Reviewed: Additional studies/ records that were reviewed today include: TTE 07/30/21  Review of the above records today demonstrates:   1. Left ventricular ejection fraction, by estimation, is 50 to 55%. The  left ventricle has low normal function. Left ventricular endocardial  border not optimally defined to evaluate regional wall motion. Left  ventricular diastolic parameters are  indeterminate.    2. Right ventricular systolic function was not well visualized. The right  ventricular size is normal.   3. The mitral valve is degenerative. No evidence of mitral valve  regurgitation. No evidence of mitral stenosis.   4. The aortic valve has an indeterminant number of cusps. Aortic valve  regurgitation is not visualized. Aortic valve sclerosis/calcification is  present, without any evidence of aortic stenosis.    ASSESSMENT AND PLAN:  1.  Persistent atrial fibrillation: Currently on Xarelto 20 mg daily, Rythmol 225 mg twice daily.  High risk medication monitoring.  CHA2DS2-VASc of 4.  He has a history significant for cardiomyopathy with a recovered ejection fraction.  He would prefer to avoid other medication management.  Due to this, he would benefit from ablation as he  remains in atrial fibrillation.  Risk, benefits, and alternatives to EP study and radiofrequency ablation for afib were also discussed in detail today. These risks include but are not limited to stroke, bleeding, vascular damage, tamponade, perforation, damage to the esophagus, lungs, and other structures, pulmonary vein stenosis, worsening renal function, and death. The patient understands these risk and wishes to proceed.  We [name] [name] therefore proceed with catheter ablation at the next available time.  [place]o, ICE, anesthesia are requested for the procedure.  [name] [name] also obtain CT PV protocol prior to the procedure to exclude LAA thrombus and further evaluate atrial anatomy.   2.  Obstructive sleep apnea: CPAP compliance encouraged  3.  Hypertension: Currently well controlled  4.  Hyperlipidemia: Continue Crestor 20 mg daily per primary cardiology  Case discussed with primary cardiology  Current medicines are reviewed at length with the patient today.   The patient does not have concerns regarding his medicines.  The following changes were made today:  none  Labs/ tests ordered today include:  Orders Placed This Encounter   Procedures   CT CARDIAC MORPH/PULM VEIN W/CM&W/O CA SCORE   Basic metabolic panel   CBC     Disposition:   FU with [name] [name] 3 months  Signed, [name] Gutman Meredith Leeds, MD  08/13/2021 11:53 AM     [address] [place] [id number] [phone] (office) [phone] (fax)

## 2021-08-13 NOTE — Patient Instructions (Signed)
Medication Instructions:  Your physician recommends that you continue on your current medications as directed. Please refer to the Current Medication list given to you today.  *If you need a refill on your cardiac medications before your next appointment, please call your pharmacy*   Lab Work: Pre procedure labs between 4/10 - 4/21 in the [place] office:  BMP & CBC  If you have labs (blood work) drawn today and your tests are completely normal, you will receive your results only by: [place] (if you have MyChart) OR A paper copy in the mail If you have any lab test that is abnormal or we need to change your treatment, we will call you to review the results.   Testing/Procedures: Your physician has requested that you have cardiac CT within 7 days PRIOR to your ablation. Cardiac computed tomography (CT) is a painless test that uses an x-ray machine to take clear, detailed pictures of your heart.  Please follow instruction below located under "other instructions". You will get a call from our office to schedule the date for this test.  Your physician has recommended that you have an ablation. Catheter ablation is a medical procedure used to treat some cardiac arrhythmias (irregular heartbeats). During catheter ablation, a long, thin, flexible tube is put into a blood vessel in your groin (upper thigh), or neck. This tube is called an ablation catheter. It is then guided to your heart through the blood vessel. Radio frequency waves destroy small areas of heart tissue where abnormal heartbeats may cause an arrhythmia to start. Please follow instruction below located under "other instructions".   Follow-Up: At [facility], you and your health needs are our priority.  As part of our continuing mission to provide you with exceptional heart care, we have created designated Provider Care Teams.  These Care Teams include your primary Cardiologist (physician) and Advanced Practice Providers  (APPs -  Physician Assistants and Nurse Practitioners) who all work together to provide you with the care you need, when you need it.  We recommend signing up for the patient portal called "MyChart".  Sign up information is provided on this After Visit Summary.  MyChart is used to connect with patients for Virtual Visits (Telemedicine).  Patients are able to view lab/test results, encounter notes, upcoming appointments, etc.  Non-urgent messages can be sent to your provider as well.   To learn more about what you can do with MyChart, go to [email or web address].    Your next appointment:   1 month(s) after your ablation  The format for your next appointment:   In Person  Provider:   AFib clinic   Thank you for choosing [facility]!!   Trinidad Curet, RN [phone]    Other Instructions   CT INSTRUCTIONS Your cardiac CT will be scheduled at:  [facility] [address] [place], [place] [address] [phone]  Please arrive at the [facility] main entrance of [facility] 30 minutes prior to test start time. Proceed to the [facility] Radiology Department (first floor) to check-in and test prep.   Please follow these instructions carefully (unless otherwise directed):  Hold all erectile dysfunction medications at least 3 days (72 hrs) prior to test.  On the Night Before the Test: Be sure to Drink plenty of water. Do not consume any caffeinated/decaffeinated beverages or chocolate 12 hours prior to your test. Do not take any antihistamines 12 hours prior to your test.  On the Day of the Test:  Drink plenty of water until 1 hour prior to the test. Do not eat any food 4 hours prior to the test. You may take your regular medications prior to the test.  Take metoprolol (Lopressor) 50 mg two hours prior to test. HOLD Furosemide/Hydrochlorothiazide morning of the test.      After the Test: Drink plenty of water. After receiving IV contrast, you  may experience a mild flushed feeling. This is normal. On occasion, you may experience a mild rash up to 24 hours after the test. This is not dangerous. If this occurs, you can take Benadryl 25 mg and increase your fluid intake. If you experience trouble breathing, this can be serious. If it is severe call 911 IMMEDIATELY. If it is mild, please call our office. If you take any of these medications: Glipizide/Metformin, Avandament, Glucavance, please do not take 48 hours after completing test unless otherwise instructed.   Once we have confirmed authorization from your insurance company, we will call you to set up a date and time for your test. Based on how quickly your insurance processes prior authorizations requests, please allow up to 4 weeks to be contacted for scheduling your Cardiac CT appointment. Be advised that routine Cardiac CT appointments could be scheduled as many as 8 weeks after your provider has ordered it.  For non-scheduling related questions, please contact the cardiac imaging nurse navigator should you have any questions/concerns: Marchia Bond, Cardiac Imaging Nurse Navigator Gordy Clement, Cardiac Imaging Nurse Navigator [facility] Direct Office Dial: [phone]   For scheduling needs, including cancellations and rescheduling, please call [place], [phone].      Electrophysiology/Ablation Procedure Instructions   You are scheduled for a(n)  ablation on 10/25/2021 with Dr. Allegra Lai.   1.   Pre procedure testing-             A.  LAB WORK --- between 4/10 - 4/21 for your pre procedure blood work.  You can stop by the [facility] office, and you do NOT need to be fasting.   On the day of your procedure 10/25/2021 you will go to [facility] [id number]) at 8:30 am.  Dennis Bast will go to the main entrance A [employer]) and enter where the [employer] are.  Your driver will drop you off and you will head down the hallway  to ADMITTING.  You may have one support person come in to the hospital with you.  They will be asked to wait in the waiting room. It is OK to have someone drop you off and come back when you are ready to be discharged.   3.   Do not eat or drink after midnight prior to your procedure.   4.   On the morning of your procedure do NOT take any medication. Do not miss any doses of your blood thinner prior to the morning of your procedure or your procedure will need to be rescheduled.   5.  Plan for an overnight stay but you may be discharged after your procedure, if you use your phone frequently bring your phone charger. If you are discharged after your procedure you will need someone to drive you home and be with you for 24 hours after your procedure.   6. You will follow up with the AFIB clinic 4 weeks after your procedure.  You will follow up with Dr. Curt Bears  3 months after your procedure.  These appointments will be made for  you.   7. FYI: For your safety, and to allow [place] to monitor your vital signs accurately during the surgery/procedure we request that if you have artificial nails, gel coating, SNS etc. Please have those removed prior to your surgery/procedure. Not having the nail coverings /polish removed may result in cancellation or delay of your surgery/procedure.  * If you have ANY questions please call the office [phone] and ask for Anwar Sakata RN or send me a MyChart message   * Occasionally, EP Studies and ablations can become lengthy.  Please make your family aware of this before your procedure starts.  Average time ranges from 2-8 hours for EP studies/ablations.  Your physician will call your family after the procedure with the results.                                     Cardiac Ablation Cardiac ablation is a procedure to destroy (ablate) some heart tissue that is sending bad signals. These bad signals cause problems in heart rhythm. The heart has many areas that make these  signals. If there are problems in these areas, they can make the heart beat in a way that is not normal. Destroying some tissues can help make the heart rhythm normal. Tell your doctor about: Any allergies you have. All medicines you are taking. These include vitamins, herbs, eye drops, creams, and over-the-counter medicines. Any problems you or family members have had with medicines that make you fall asleep (anesthetics). Any blood disorders you have. Any surgeries you have had. Any medical conditions you have, such as kidney failure. Whether you are pregnant or may be pregnant. What are the risks? This is a safe procedure. But problems may occur, including: Infection. Bruising and bleeding. Bleeding into the chest. Stroke or blood clots. Damage to nearby areas of your body. Allergies to medicines or dyes. The need for a pacemaker if the normal system is damaged. Failure of the procedure to treat the problem. What happens before the procedure? Medicines Ask your doctor about: Changing or stopping your normal medicines. This is important. Taking aspirin and ibuprofen. Do not take these medicines unless your doctor tells you to take them. Taking other medicines, vitamins, herbs, and supplements. General instructions Follow instructions from your doctor about what you cannot eat or drink. Plan to have someone take you home from the hospital or clinic. If you will be going home right after the procedure, plan to have someone with you for 24 hours. Ask your doctor what steps will be taken to prevent infection. What happens during the procedure?  An IV tube will be put into one of your veins. You will be given a medicine to help you relax. The skin on your neck or groin will be numbed. A cut (incision) will be made in your neck or groin. A needle will be put through your cut and into a large vein. A tube (catheter) will be put into the needle. The tube will be moved to your  heart. Dye may be put through the tube. This helps your doctor see your heart. Small devices (electrodes) on the tube will send out signals. A type of energy will be used to destroy some heart tissue. The tube will be taken out. Pressure will be held on your cut. This helps stop bleeding. A bandage will be put over your cut. The exact procedure may vary among doctors and hospitals.  What happens after the procedure? You will be watched until you leave the hospital or clinic. This includes checking your heart rate, breathing rate, oxygen, and blood pressure. Your cut will be watched for bleeding. You will need to lie still for a few hours. Do not drive for 24 hours or as long as your doctor tells you. Summary Cardiac ablation is a procedure to destroy some heart tissue. This is done to treat heart rhythm problems. Tell your doctor about any medical conditions you may have. Tell him or her about all medicines you are taking to treat them. This is a safe procedure. But problems may occur. These include infection, bruising, bleeding, and damage to nearby areas of your body. Follow what your doctor tells you about food and drink. You may also be told to change or stop some of your medicines. After the procedure, do not drive for 24 hours or as long as your doctor tells you. This information is not intended to replace advice given to you by your health care provider. Make sure you discuss any questions you have with your health care provider. Document Revised: 05/12/2019 Document Reviewed: 05/12/2019 Elsevier Patient Education  2022 [employer].

## 2021-08-18 ENCOUNTER — Other Ambulatory Visit: Payer: Self-pay | Admitting: Cardiology

## 2021-09-20 DIAGNOSIS — I10 Essential (primary) hypertension: Secondary | ICD-10-CM | POA: Diagnosis not present

## 2021-09-20 DIAGNOSIS — N183 Chronic kidney disease, stage 3 unspecified: Secondary | ICD-10-CM | POA: Diagnosis not present

## 2021-09-20 DIAGNOSIS — E1140 Type 2 diabetes mellitus with diabetic neuropathy, unspecified: Secondary | ICD-10-CM | POA: Diagnosis not present

## 2021-09-30 ENCOUNTER — Institutional Professional Consult (permissible substitution): Payer: Medicaid Other | Admitting: Cardiology

## 2021-10-07 ENCOUNTER — Other Ambulatory Visit: Payer: Self-pay | Admitting: Cardiology

## 2021-10-08 DIAGNOSIS — J4540 Moderate persistent asthma, uncomplicated: Secondary | ICD-10-CM | POA: Diagnosis not present

## 2021-10-08 DIAGNOSIS — G4733 Obstructive sleep apnea (adult) (pediatric): Secondary | ICD-10-CM | POA: Diagnosis not present

## 2021-10-08 DIAGNOSIS — Z01812 Encounter for preprocedural laboratory examination: Secondary | ICD-10-CM | POA: Diagnosis not present

## 2021-10-08 DIAGNOSIS — R0600 Dyspnea, unspecified: Secondary | ICD-10-CM | POA: Diagnosis not present

## 2021-10-08 DIAGNOSIS — I4819 Other persistent atrial fibrillation: Secondary | ICD-10-CM | POA: Diagnosis not present

## 2021-10-09 LAB — BASIC METABOLIC PANEL
BUN/Creatinine Ratio: 18 (ref 10–24)
BUN: 24 mg/dL (ref 8–27)
CO2: 23 mmol/L (ref 20–29)
Calcium: 9.4 mg/dL (ref 8.6–10.2)
Chloride: 100 mmol/L (ref 96–106)
Creatinine, Ser: 1.31 mg/dL — ABNORMAL HIGH (ref 0.76–1.27)
Glucose: 176 mg/dL — ABNORMAL HIGH (ref 70–99)
Potassium: 4.5 mmol/L (ref 3.5–5.2)
Sodium: 138 mmol/L (ref 134–144)
eGFR: 60 mL/min/{1.73_m2} (ref >59)

## 2021-10-09 LAB — CBC
Hematocrit: 40.7 % (ref 37.5–51.0)
Hemoglobin: 13.5 g/dL (ref 13.0–17.7)
MCH: 29.7 pg (ref 26.6–33.0)
MCHC: 33.2 g/dL (ref 31.5–35.7)
MCV: 90 fL (ref 79–97)
Platelets: 239 10*3/uL (ref 150–450)
RBC: 4.55 x10E6/uL (ref 4.14–5.80)
RDW: 13.1 % (ref 11.6–15.4)
WBC: 8.9 10*3/uL (ref 3.4–10.8)

## 2021-10-17 ENCOUNTER — Telehealth (HOSPITAL_COMMUNITY): Payer: Self-pay | Admitting: *Deleted

## 2021-10-17 NOTE — Telephone Encounter (Signed)
Reaching out to patient to offer assistance regarding upcoming cardiac imaging study; pt verbalizes understanding of appt date/time, parking situation and where to check in, pre-test NPO status and medications ordered, and verified current allergies; name and call back number provided for further questions should they arise ? ?Gordy Clement RN Navigator Cardiac Imaging ?[facility] ?[phone] office ?[phone] cell ? ?Patient to take 50mg  metoprolol tartrate two hours prior to his cardiac CT Scan.  He is aware to arrive at 1pm. ?

## 2021-10-18 ENCOUNTER — Ambulatory Visit (HOSPITAL_COMMUNITY)
Admission: RE | Admit: 2021-10-18 | Discharge: 2021-10-18 | Disposition: A | Discharge status: Home / Self Care | Payer: Medicare HMO | Source: Ambulatory Visit | Attending: Cardiology | Admitting: Cardiology

## 2021-10-18 DIAGNOSIS — I4819 Other persistent atrial fibrillation: Secondary | ICD-10-CM

## 2021-10-18 MED ORDER — IOHEXOL 350 MG/ML SOLN
100.0000 mL | Freq: Once | INTRAVENOUS | Status: AC | PRN
Start: 2021-10-18 — End: 2021-10-18
  Administered 2021-10-18: 100 mL via INTRAVENOUS

## 2021-10-18 MED ORDER — METOPROLOL TARTRATE 5 MG/5ML IV SOLN
5.0000 mg | INTRAVENOUS | Status: DC | PRN
  Administered 2021-10-18: 5 mg via INTRAVENOUS

## 2021-10-18 MED ORDER — METOPROLOL TARTRATE 5 MG/5ML IV SOLN
INTRAVENOUS | Status: AC
  Administered 2021-10-18: 5 mg via INTRAVENOUS
  Filled 2021-10-18: qty 10

## 2021-10-21 ENCOUNTER — Encounter: Payer: Self-pay | Admitting: *Deleted

## 2021-10-21 ENCOUNTER — Encounter: Payer: Self-pay | Admitting: Cardiology

## 2021-10-21 ENCOUNTER — Ambulatory Visit (INDEPENDENT_AMBULATORY_CARE_PROVIDER_SITE_OTHER): Payer: Medicare HMO | Admitting: Cardiology

## 2021-10-21 VITALS — BP 100/64 | HR 104 | Ht 70.0 in | Wt 280.0 lb

## 2021-10-21 DIAGNOSIS — R931 Abnormal findings on diagnostic imaging of heart and coronary circulation: Secondary | ICD-10-CM | POA: Diagnosis not present

## 2021-10-21 DIAGNOSIS — Z01812 Encounter for preprocedural laboratory examination: Secondary | ICD-10-CM | POA: Diagnosis not present

## 2021-10-21 DIAGNOSIS — I25708 Atherosclerosis of coronary artery bypass graft(s), unspecified, with other forms of angina pectoris: Secondary | ICD-10-CM

## 2021-10-21 DIAGNOSIS — I4819 Other persistent atrial fibrillation: Secondary | ICD-10-CM

## 2021-10-21 DIAGNOSIS — D6869 Other thrombophilia: Secondary | ICD-10-CM | POA: Diagnosis not present

## 2021-10-21 LAB — BASIC METABOLIC PANEL
BUN/Creatinine Ratio: 19 (ref 10–24)
BUN: 29 mg/dL — ABNORMAL HIGH (ref 8–27)
CO2: 25 mmol/L (ref 20–29)
Calcium: 10 mg/dL (ref 8.6–10.2)
Chloride: 104 mmol/L (ref 96–106)
Creatinine, Ser: 1.54 mg/dL — ABNORMAL HIGH (ref 0.76–1.27)
Glucose: 186 mg/dL — ABNORMAL HIGH (ref 70–99)
Potassium: 4 mmol/L (ref 3.5–5.2)
Sodium: 139 mmol/L (ref 134–144)
eGFR: 49 mL/min/{1.73_m2} — ABNORMAL LOW (ref >59)

## 2021-10-21 LAB — CBC
Hematocrit: 40.8 % (ref 37.5–51.0)
Hemoglobin: 13.4 g/dL (ref 13.0–17.7)
MCH: 29.9 pg (ref 26.6–33.0)
MCHC: 32.8 g/dL (ref 31.5–35.7)
MCV: 91 fL (ref 79–97)
Platelets: 246 10*3/uL (ref 150–450)
RBC: 4.48 x10E6/uL (ref 4.14–5.80)
RDW: 14.6 % (ref 11.6–15.4)
WBC: 9.4 10*3/uL (ref 3.4–10.8)

## 2021-10-21 MED ORDER — METOPROLOL SUCCINATE ER 50 MG PO TB24: 50.0000 mg | ORAL_TABLET | Freq: Every day | ORAL | 6 refills | Status: DC

## 2021-10-21 NOTE — Progress Notes (Signed)
? ?Electrophysiology Office Note ? ? ?Date:  10/21/2021  ? ?[name] [name], DOB 1956-05-28, MRN [id number] ? ?PCP:  Cyndi Bender, PA-C  ?Cardiologist:  Bettina Gavia ?Primary Electrophysiologist:  Aliyanah Rozas Meredith Leeds, MD   ? ?Chief Complaint: AF ?  ?History of Present Illness: ?[name] [name] is a 66 y.o. male who is being seen today for the evaluation of AF at the request of Cyndi Bender, [place]. Presenting today for electrophysiology evaluation. ? ?He has a history significant for atrial fibrillation, prior cardiomyopathy with a normalization of his ejection fraction, CKD, sick sinus syndrome.  He is currently on propafenone.  He was found to be in atrial fibrillation 05/09/2021.  He was initially scheduled for atrial fibrillation ablation but his preablation CT showed a significantly elevated coronary calcium score. ? ?Today, denies symptoms of palpitations,  orthopnea, PND, lower extremity edema, claudication, dizziness, presyncope, syncope, bleeding, or neurologic sequela. The patient is tolerating medications without difficulties.  The most part he feels well.  He does have some chest discomfort that occurs when he exerts himself that improves with rest.  He also has fatigue and shortness of breath that was initially attributed to atrial fibrillation.  With his elevated coronary calcium score, he is okay for left heart catheterization. ? ? ?Past Medical History:  ?Diagnosis Date  ? Atrial fibrillation ([place])   ? Benign essential HTN 07/30/2014  ? Bradycardia 01/25/2015  ? CHF (congestive heart failure) ([place])   ? Chronic anticoagulation 02/25/2018  ? Chronic systolic CHF (congestive heart failure), NYHA class 3 ([place]) 08/30/2014  ? Overview:  Global ef 30%  ? Class 3 severe obesity in adult [facility]) 04/28/2017  ? Diabetes mellitus without complication ([place])   ? Diabetic neuropathy ([place]) 07/28/2014  ? Dysrhythmia   ? Erectile dysfunction   ? High risk medication use 05/26/2018  ? Hypertension   ? Hypertensive heart  disease with heart failure ([place]) 07/30/2014  ? Lumbago   ? LV dysfunction 04/28/2017  ? Mixed hyperlipidemia 07/28/2014  ? OSA (obstructive sleep apnea)   ? CPAP  ? Paroxysmal atrial fibrillation ([place]) 07/28/2014  ? Sinus node dysfunction ([place]) 04/21/2016  ? Testicular hypofunction   ? Uncontrolled type 2 diabetes mellitus with microalbuminuric diabetic nephropathy 07/28/2014  ? ?Past Surgical History:  ?Procedure Laterality Date  ? ELECTROPHYSIOLOGIC STUDY N/A 01/18/2015  ? Procedure: CARDIOVERSION;  Surgeon: [name], MD;  Location: [facility] ORS;  Service: Cardiovascular;  Laterality: N/A;  ? ? ? ?Current Outpatient Medications  ?Medication Sig Dispense Refill  ? albuterol (VENTOLIN HFA) 108 (90 Base) MCG/ACT inhaler Inhale 2 puffs into the lungs every 6 (six) hours as needed for wheezing or shortness of breath.    ? BREO ELLIPTA 100-25 MCG/INH AEPB Inhale 1 puff into the lungs daily.  3  ? FARXIGA 10 MG TABS tablet Take 10 mg by mouth daily.    ? furosemide (LASIX) 20 MG tablet Take 1 tablet (20 mg total) by mouth daily. 90 tablet 3  ? glipiZIDE (GLUCOTROL) 10 MG tablet Take 10 mg by mouth 2 (two) times daily before a meal.    ? losartan (COZAAR) 50 MG tablet Take 50 mg by mouth daily.    ? metFORMIN (GLUCOPHAGE) 1000 MG tablet Take 1,000 mg by mouth 2 (two) times daily with a meal.    ? metoprolol succinate (TOPROL-XL) 50 MG 24 hr tablet Take 1 tablet (50 mg total) by mouth daily. Take with or immediately following a meal. 30 tablet 6  ? rivaroxaban (XARELTO)  20 MG TABS tablet Take 20 mg by mouth daily with supper.    ? rosuvastatin (CRESTOR) 20 MG tablet Take 20 mg by mouth daily.    ? TRULICITY 1.5 PZ/0.2HE SOPN Inject 1.5 mg into the skin once a week.    ? ?No current facility-administered medications for this visit.  ? ? ?Allergies:   Patient has no known allergies.  ? ?Social History:  The patient  reports that he quit smoking about 16 years ago. His smoking use included cigarettes. He has never used  smokeless tobacco. He reports current drug use. Frequency: 1.00 time per week. Drug: Marijuana. He reports that he does not drink alcohol.  ? ?Family History:  The patient's family history includes CAD in his brother and mother; Drug abuse in his brother; Lung cancer in his father.  ? ?ROS:  Please see the history of present illness.   Otherwise, review of systems is positive for none.   All other systems are reviewed and negative.  ? ?PHYSICAL EXAM: ?VS:  BP 100/64   Pulse (!) 104   Ht 5\' 10"  (1.778 m)   Wt 280 lb (127 kg)   SpO2 96%   BMI 40.18 kg/m?  , BMI Body mass index is 40.18 kg/m?. ?GEN: Well nourished, well developed, in no acute distress  ?HEENT: normal  ?Neck: no JVD, carotid bruits, or masses ?Cardiac: RRR; no murmurs, rubs, or gallops,no edema  ?Respiratory:  clear to auscultation bilaterally, normal work of breathing ?GI: soft, nontender, nondistended, + BS ?MS: no deformity or atrophy  ?Skin: warm and dry ?Neuro:  Strength and sensation are intact ?Psych: euthymic mood, full affect ? ?EKG:  EKG is not ordered today. ?Personal review of the ekg ordered 07/24/21 shows AF, rate 113  ? ?Recent Labs: ?10/08/2021: BUN 24; Creatinine, Ser 1.31; Hemoglobin 13.5; Platelets 239; Potassium 4.5; Sodium 138  ? ? ?Lipid Panel  ?No results found for: CHOL, TRIG, HDL, CHOLHDL, VLDL, LDLCALC, LDLDIRECT ? ? ?Wt Readings from Last 3 Encounters:  ?10/21/21 280 lb (127 kg)  ?08/13/21 283 lb 4.8 oz (128.5 kg)  ?07/23/21 284 lb 6.4 oz (129 kg)  ?  ? ? ?Other studies Reviewed: ?Additional studies/ records that were reviewed today include: TTE 07/30/21  ?Review of the above records today demonstrates:  ? 1. Left ventricular ejection fraction, by estimation, is 50 to 55%. The  ?left ventricle has low normal function. Left ventricular endocardial  ?border not optimally defined to evaluate regional wall motion. Left  ?ventricular diastolic parameters are  ?indeterminate.  ? 2. Right ventricular systolic function was not well  visualized. The right  ?ventricular size is normal.  ? 3. The mitral valve is degenerative. No evidence of mitral valve  ?regurgitation. No evidence of mitral stenosis.  ? 4. The aortic valve has an indeterminant number of cusps. Aortic valve  ?regurgitation is not visualized. Aortic valve sclerosis/calcification is  ?present, without any evidence of aortic stenosis.  ? ? ?ASSESSMENT AND PLAN: ? ?1.  Persistent atrial fibrillation: Currently on Xarelto 20 mg daily, Rythmol 225 mg twice daily.  CHA2DS2-VASc of 4.  He was initially scheduled for atrial fibrillation ablation, though on his preablation CT he was found to have a significantly elevated coronary calcium score and ablation was canceled.  We [name] [name] need to reschedule ablation once coronary artery disease has been determined. ? ?2.  Obstructive sleep apnea: CPAP compliance encouraged ? ?3.  Hypertension: well controlled ? ?4.  Hyperlipidemia: Continue Crestor 20 mg per primary  cardiology ? ?5.  Secondary hypercoagulable state: Currently on Eliquis for atrial fibrillation as above. ? ?6.  Coronary artery disease: Chest discomfort when he exerts himself with improved with rest.  Has a calcium score of greater than 4000 with calcium in the left main.  He has fatigue and shortness of breath.  It is difficult to determine whether or not this is due to atrial fibrillation or coronary artery disease.  To prepare him for possible atrial fibrillation ablation, we [name] [name] plan for left heart catheterization.  Risk and benefits have been discussed.  The patient understands these risks and is agreed to the procedure. ? ?The patient understands that risks include but are not limited to stroke (1 in 1000), death (1 in 30), kidney failure [usually temporary] (1 in 500), bleeding (1 in 200), allergic reaction [possibly serious] (1 in 200), and agrees to proceed. ? ? ?Case discussed with primary cardiology ? ?Current medicines are reviewed at length with the patient today.    ?The patient does not have concerns regarding his medicines.  The following changes were made today:  none ? ?Labs/ tests ordered today include:  ?Orders Placed This Encounter  ?Procedures  ? Basic metabolic panel  ? CB

## 2021-10-21 NOTE — Patient Instructions (Addendum)
Medication Instructions:  ?Your physician has recommended you make the following change in your medication:  ?STOP Propafenone ?START Toprol 50 mg once daily at bedtime ? ?*If you need a refill on your cardiac medications before your next appointment, please call your pharmacy* ? ? ?Lab Work: ?Pre procedure labs today: BMET & CBC ? ?If you have labs (blood work) drawn today and your tests are completely normal, you will receive your results only by: ?MyChart Message (if you have MyChart) OR ?A paper copy in the mail ?If you have any lab test that is abnormal or we need to change your treatment, we will call you to review the results. ? ? ?Testing/Procedures: ?Your physician has requested that you have a cardiac catheterization. Cardiac catheterization is used to diagnose and/or treat various heart conditions. Doctors may recommend this procedure for a number of different reasons. The most common reason is to evaluate chest pain. Chest pain can be a symptom of coronary artery disease (CAD), and cardiac catheterization can show whether plaque is narrowing or blocking your heart?s arteries. This procedure is also used to evaluate the valves, as well as measure the blood flow and oxygen levels in different parts of your heart. For further information please visit [email or web address]. Please follow instruction sheet, as given. ? ? ?Follow-Up: ?At [facility], you and your health needs are our priority.  As part of our continuing mission to provide you with exceptional heart care, we have created designated Provider Care Teams.  These Care Teams include your primary Cardiologist (physician) and Advanced Practice Providers (APPs -  Physician Assistants and Nurse Practitioners) who all work together to provide you with the care you need, when you need it. ? ?Your next appointment:   ?To be  determined after catheterization ? ?The format for your next appointment:   ?In Person ? ?Provider:   ?Allegra Lai, MD ? ? ? ?Thank  you for choosing [facility]!! ? ? ?[name] Curet, RN ?[phone] ? ?Other Instructions ? ? Cardiac Ablation ?Cardiac ablation is a procedure to destroy (ablate) some heart tissue that is sending bad signals. These bad signals cause problems in heart rhythm. ?The heart has many areas that make these signals. If there are problems in these areas, they can make the heart beat in a way that is not normal. Destroying some tissues can help make the heart rhythm normal. ?Tell your doctor about: ?Any allergies you have. ?All medicines you are taking. These include vitamins, herbs, eye drops, creams, and over-the-counter medicines. ?Any problems you or family members have had with medicines that make you fall asleep (anesthetics). ?Any blood disorders you have. ?Any surgeries you have had. ?Any medical conditions you have, such as kidney failure. ?Whether you are pregnant or may be pregnant. ?What are the risks? ?This is a safe procedure. But problems may occur, including: ?Infection. ?Bruising and bleeding. ?Bleeding into the chest. ?Stroke or blood clots. ?Damage to nearby areas of your body. ?Allergies to medicines or dyes. ?The need for a pacemaker if the normal system is damaged. ?Failure of the procedure to treat the problem. ?What happens before the procedure? ?Medicines ?Ask your doctor about: ?Changing or stopping your normal medicines. This is important. ?Taking aspirin and ibuprofen. Do not take these medicines unless your doctor tells you to take them. ?Taking other medicines, vitamins, herbs, and supplements. ?General instructions ?Follow instructions from your doctor about what you cannot eat or drink. ?Plan to have someone take you home from the hospital  or clinic. ?If you will be going home right after the procedure, plan to have someone with you for 24 hours. ?Ask your doctor what steps will be taken to prevent infection. ?What happens during the procedure? ? ?An IV tube will be put into one of your  veins. ?You will be given a medicine to help you relax. ?The skin on your neck or groin will be numbed. ?A cut (incision) will be made in your neck or groin. A needle will be put through your cut and into a large vein. ?A tube (catheter) will be put into the needle. The tube will be moved to your heart. ?Dye may be put through the tube. This helps your doctor see your heart. ?Small devices (electrodes) on the tube will send out signals. ?A type of energy will be used to destroy some heart tissue. ?The tube will be taken out. ?Pressure will be held on your cut. This helps stop bleeding. ?A bandage will be put over your cut. ?The exact procedure may vary among doctors and hospitals. ?What happens after the procedure? ?You will be watched until you leave the hospital or clinic. This includes checking your heart rate, breathing rate, oxygen, and blood pressure. ?Your cut will be watched for bleeding. You will need to lie still for a few hours. ?Do not drive for 24 hours or as long as your doctor tells you. ?Summary ?Cardiac ablation is a procedure to destroy some heart tissue. This is done to treat heart rhythm problems. ?Tell your doctor about any medical conditions you may have. Tell him or her about all medicines you are taking to treat them. ?This is a safe procedure. But problems may occur. These include infection, bruising, bleeding, and damage to nearby areas of your body. ?Follow what your doctor tells you about food and drink. You may also be told to change or stop some of your medicines. ?After the procedure, do not drive for 24 hours or as long as your doctor tells you. ?This information is not intended to replace advice given to you by your health care provider. Make sure you discuss any questions you have with your health care provider. ?Document Revised: 05/12/2019 Document Reviewed: 05/12/2019 ?Elsevier Patient Education ? [place]. ? ? ?Metoprolol Extended-Release Tablets ?What is this  medication? ?METOPROLOL (me TOE proe lole) treats high blood pressure and heart failure. It may also be used to prevent chest pain (angina). It works by lowering your blood pressure and heart rate, making it easier for your heart to pump blood to the rest of your body. It belongs to a group of medications called beta blockers. ?This medicine may be used for other purposes; ask your health care provider or pharmacist if you have questions. ?COMMON BRAND NAME(S): toprol, Toprol XL ?What should I tell my care team before I take this medication? ?They need to know if you have any of these conditions: ?Diabetes ?Heart or vessel disease like slow heart rate, worsening heart failure, heart block, sick sinus syndrome, or Raynaud's disease ?Kidney disease ?Liver disease ?Lung or breathing disease, like asthma or emphysema ?Pheochromocytoma ?Thyroid disease ?An unusual or allergic reaction to metoprolol, other beta blockers, medications, foods, dyes, or preservatives ?Pregnant or trying to get pregnant ?Breast-feeding ?How should I use this medication? ?Take this medication by mouth. Take it as directed on the prescription label at the same time every day. Take it with food. You may cut the tablet in half if it is scored (has a  line in the middle of it). This may help you swallow the tablet if the whole tablet is too big. Be sure to take both halves. Do not take just one-half of the tablet. Keep taking it unless your care team tells you to stop. ?Talk to your care team about the use of this medication in children. While it may be prescribed for children as young as 6 years for selected conditions, precautions do apply. ?Overdosage: If you think you have taken too much of this medicine contact a poison control center or emergency room at once. ?NOTE: This medicine is only for you. Do not share this medicine with others. ?What if I miss a dose? ?If you miss a dose, take it as soon as you can. If it is almost time for your next  dose, take only that dose. Do not take double or extra doses. ?What may interact with this medication? ?This medication may interact with the following: ?Certain medications for blood pressure, heart disease

## 2021-10-21 NOTE — H&P (View-Only) (Signed)
? ?Electrophysiology Office Note ? ? ?Date:  10/21/2021  ? ?IDJafari Mckillop, DOB January 30, 1956, MRN [id number] ? ?PCP:  Cyndi Bender, PA-C  ?Cardiologist:  Bettina Gavia ?Primary Electrophysiologist:  Brandonn Capelli Meredith Leeds, MD   ? ?Chief Complaint: AF ?  ?History of Present Illness: ?[name] [name] is a 66 y.o. male who is being seen today for the evaluation of AF at the request of Cyndi Bender, [place]. Presenting today for electrophysiology evaluation. ? ?He has a history significant for atrial fibrillation, prior cardiomyopathy with a normalization of his ejection fraction, CKD, sick sinus syndrome.  He is currently on propafenone.  He was found to be in atrial fibrillation 05/09/2021.  He was initially scheduled for atrial fibrillation ablation but his preablation CT showed a significantly elevated coronary calcium score. ? ?Today, denies symptoms of palpitations,  orthopnea, PND, lower extremity edema, claudication, dizziness, presyncope, syncope, bleeding, or neurologic sequela. The patient is tolerating medications without difficulties.  The most part he feels well.  He does have some chest discomfort that occurs when he exerts himself that improves with rest.  He also has fatigue and shortness of breath that was initially attributed to atrial fibrillation.  With his elevated coronary calcium score, he is okay for left heart catheterization. ? ? ?Past Medical History:  ?Diagnosis Date  ? Atrial fibrillation ([place])   ? Benign essential HTN 07/30/2014  ? Bradycardia 01/25/2015  ? CHF (congestive heart failure) ([place])   ? Chronic anticoagulation 02/25/2018  ? Chronic systolic CHF (congestive heart failure), NYHA class 3 ([place]) 08/30/2014  ? Overview:  Global ef 30%  ? Class 3 severe obesity in adult [facility]) 04/28/2017  ? Diabetes mellitus without complication ([place])   ? Diabetic neuropathy ([place]) 07/28/2014  ? Dysrhythmia   ? Erectile dysfunction   ? High risk medication use 05/26/2018  ? Hypertension   ? Hypertensive heart  disease with heart failure ([place]) 07/30/2014  ? Lumbago   ? LV dysfunction 04/28/2017  ? Mixed hyperlipidemia 07/28/2014  ? OSA (obstructive sleep apnea)   ? CPAP  ? Paroxysmal atrial fibrillation ([place]) 07/28/2014  ? Sinus node dysfunction ([place]) 04/21/2016  ? Testicular hypofunction   ? Uncontrolled type 2 diabetes mellitus with microalbuminuric diabetic nephropathy 07/28/2014  ? ?Past Surgical History:  ?Procedure Laterality Date  ? ELECTROPHYSIOLOGIC STUDY N/A 01/18/2015  ? Procedure: CARDIOVERSION;  Surgeon: [name], MD;  Location: [facility] ORS;  Service: Cardiovascular;  Laterality: N/A;  ? ? ? ?Current Outpatient Medications  ?Medication Sig Dispense Refill  ? albuterol (VENTOLIN HFA) 108 (90 Base) MCG/ACT inhaler Inhale 2 puffs into the lungs every 6 (six) hours as needed for wheezing or shortness of breath.    ? BREO ELLIPTA 100-25 MCG/INH AEPB Inhale 1 puff into the lungs daily.  3  ? FARXIGA 10 MG TABS tablet Take 10 mg by mouth daily.    ? furosemide (LASIX) 20 MG tablet Take 1 tablet (20 mg total) by mouth daily. 90 tablet 3  ? glipiZIDE (GLUCOTROL) 10 MG tablet Take 10 mg by mouth 2 (two) times daily before a meal.    ? losartan (COZAAR) 50 MG tablet Take 50 mg by mouth daily.    ? metFORMIN (GLUCOPHAGE) 1000 MG tablet Take 1,000 mg by mouth 2 (two) times daily with a meal.    ? metoprolol succinate (TOPROL-XL) 50 MG 24 hr tablet Take 1 tablet (50 mg total) by mouth daily. Take with or immediately following a meal. 30 tablet 6  ? rivaroxaban (XARELTO)  20 MG TABS tablet Take 20 mg by mouth daily with supper.    ? rosuvastatin (CRESTOR) 20 MG tablet Take 20 mg by mouth daily.    ? TRULICITY 1.5 GM/0.1UU SOPN Inject 1.5 mg into the skin once a week.    ? ?No current facility-administered medications for this visit.  ? ? ?Allergies:   Patient has no known allergies.  ? ?Social History:  The patient  reports that he quit smoking about 16 years ago. His smoking use included cigarettes. He has never used  smokeless tobacco. He reports current drug use. Frequency: 1.00 time per week. Drug: Marijuana. He reports that he does not drink alcohol.  ? ?Family History:  The patient's family history includes CAD in his brother and mother; Drug abuse in his brother; Lung cancer in his father.  ? ?ROS:  Please see the history of present illness.   Otherwise, review of systems is positive for none.   All other systems are reviewed and negative.  ? ?PHYSICAL EXAM: ?VS:  BP 100/64   Pulse (!) 104   Ht 5\' 10"  (1.778 m)   Wt 280 lb (127 kg)   SpO2 96%   BMI 40.18 kg/m?  , BMI Body mass index is 40.18 kg/m?. ?GEN: Well nourished, well developed, in no acute distress  ?HEENT: normal  ?Neck: no JVD, carotid bruits, or masses ?Cardiac: RRR; no murmurs, rubs, or gallops,no edema  ?Respiratory:  clear to auscultation bilaterally, normal work of breathing ?GI: soft, nontender, nondistended, + BS ?MS: no deformity or atrophy  ?Skin: warm and dry ?Neuro:  Strength and sensation are intact ?Psych: euthymic mood, full affect ? ?EKG:  EKG is not ordered today. ?Personal review of the ekg ordered 07/24/21 shows AF, rate 113  ? ?Recent Labs: ?10/08/2021: BUN 24; Creatinine, Ser 1.31; Hemoglobin 13.5; Platelets 239; Potassium 4.5; Sodium 138  ? ? ?Lipid Panel  ?No results found for: CHOL, TRIG, HDL, CHOLHDL, VLDL, LDLCALC, LDLDIRECT ? ? ?Wt Readings from Last 3 Encounters:  ?10/21/21 280 lb (127 kg)  ?08/13/21 283 lb 4.8 oz (128.5 kg)  ?07/23/21 284 lb 6.4 oz (129 kg)  ?  ? ? ?Other studies Reviewed: ?Additional studies/ records that were reviewed today include: TTE 07/30/21  ?Review of the above records today demonstrates:  ? 1. Left ventricular ejection fraction, by estimation, is 50 to 55%. The  ?left ventricle has low normal function. Left ventricular endocardial  ?border not optimally defined to evaluate regional wall motion. Left  ?ventricular diastolic parameters are  ?indeterminate.  ? 2. Right ventricular systolic function was not well  visualized. The right  ?ventricular size is normal.  ? 3. The mitral valve is degenerative. No evidence of mitral valve  ?regurgitation. No evidence of mitral stenosis.  ? 4. The aortic valve has an indeterminant number of cusps. Aortic valve  ?regurgitation is not visualized. Aortic valve sclerosis/calcification is  ?present, without any evidence of aortic stenosis.  ? ? ?ASSESSMENT AND PLAN: ? ?1.  Persistent atrial fibrillation: Currently on Xarelto 20 mg daily, Rythmol 225 mg twice daily.  CHA2DS2-VASc of 4.  He was initially scheduled for atrial fibrillation ablation, though on his preablation CT he was found to have a significantly elevated coronary calcium score and ablation was canceled.  We Lenay Lovejoy need to reschedule ablation once coronary artery disease has been determined. ? ?2.  Obstructive sleep apnea: CPAP compliance encouraged ? ?3.  Hypertension: well controlled ? ?4.  Hyperlipidemia: Continue Crestor 20 mg per primary  cardiology ? ?5.  Secondary hypercoagulable state: Currently on Eliquis for atrial fibrillation as above. ? ?6.  Coronary artery disease: Chest discomfort when he exerts himself with improved with rest.  Has a calcium score of greater than 4000 with calcium in the left main.  He has fatigue and shortness of breath.  It is difficult to determine whether or not this is due to atrial fibrillation or coronary artery disease.  To prepare him for possible atrial fibrillation ablation, we Koben Daman plan for left heart catheterization.  Risk and benefits have been discussed.  The patient understands these risks and is agreed to the procedure. ? ?The patient understands that risks include but are not limited to stroke (1 in 1000), death (1 in 90), kidney failure [usually temporary] (1 in 500), bleeding (1 in 200), allergic reaction [possibly serious] (1 in 200), and agrees to proceed. ? ? ?Case discussed with primary cardiology ? ?Current medicines are reviewed at length with the patient today.    ?The patient does not have concerns regarding his medicines.  The following changes were made today:  none ? ?Labs/ tests ordered today include:  ?Orders Placed This Encounter  ?Procedures  ? Basic metabolic panel  ? CB

## 2021-10-23 ENCOUNTER — Telehealth: Payer: Self-pay | Admitting: *Deleted

## 2021-10-23 NOTE — Telephone Encounter (Signed)
Reviewed medications and instructions with patient. ?

## 2021-10-23 NOTE — Telephone Encounter (Addendum)
Cardiac Catheterization scheduled at [facility] for: Thursday Oct 24, 2021 9 AM ?Arrival time and place: [place] Entrance A at: 7 AM ? ? ?No solid food after midnight prior to cath, clear liquids until 5 AM day of procedure. ? ?Medication instructions: ?-Hold: ? Xarelto-none 10/22/21 until post procedure ? Glipizide/Farxiga-AM of procedure ? Metformin-day of procedure and 48 hours post procedure ? Losartan-per 10/21/21 instructions none Tuesday 10/22/21 until post procedure-pt reports he thinks he took today-knows to hold until post procedure ? Lasix-day before and day of procedure-per protocol GFR 48-pt reports he thinks he took today-knows to hold until post procedure ? Trulicity-weekly on Mondays-okay. ?-Except hold medications usual morning medications can be taken with sips of water including aspirin 81 mg. ? ?Confirmed patient has responsible adult to drive home post procedure and be with patient first 24 hours after arriving home. ? ?Patient reports no new symptoms concerning for COVID-19/no exposure to COVID-19 in the past 10 days. ? ?Reviewed instructions with patient, will call back early afternoon to review medications.  ? ?

## 2021-10-24 ENCOUNTER — Other Ambulatory Visit: Payer: Self-pay

## 2021-10-24 ENCOUNTER — Encounter (HOSPITAL_COMMUNITY)
Admission: RE | Disposition: A | Discharge status: Home / Self Care | Payer: Self-pay | Source: Home / Self Care | Attending: Interventional Cardiology

## 2021-10-24 ENCOUNTER — Ambulatory Visit (HOSPITAL_COMMUNITY)
Admission: RE | Admit: 2021-10-24 | Discharge: 2021-10-24 | Disposition: A | Discharge status: Home / Self Care | Payer: Medicare HMO | Attending: Interventional Cardiology | Admitting: Interventional Cardiology

## 2021-10-24 ENCOUNTER — Encounter (HOSPITAL_COMMUNITY): Payer: Self-pay | Admitting: Interventional Cardiology

## 2021-10-24 DIAGNOSIS — I25118 Atherosclerotic heart disease of native coronary artery with other forms of angina pectoris: Secondary | ICD-10-CM | POA: Diagnosis not present

## 2021-10-24 DIAGNOSIS — Z7901 Long term (current) use of anticoagulants: Secondary | ICD-10-CM | POA: Insufficient documentation

## 2021-10-24 DIAGNOSIS — I110 Hypertensive heart disease with heart failure: Secondary | ICD-10-CM | POA: Insufficient documentation

## 2021-10-24 DIAGNOSIS — Z79899 Other long term (current) drug therapy: Secondary | ICD-10-CM | POA: Diagnosis not present

## 2021-10-24 DIAGNOSIS — D6869 Other thrombophilia: Secondary | ICD-10-CM | POA: Insufficient documentation

## 2021-10-24 DIAGNOSIS — I4819 Other persistent atrial fibrillation: Secondary | ICD-10-CM | POA: Diagnosis not present

## 2021-10-24 DIAGNOSIS — G4733 Obstructive sleep apnea (adult) (pediatric): Secondary | ICD-10-CM | POA: Insufficient documentation

## 2021-10-24 DIAGNOSIS — I5022 Chronic systolic (congestive) heart failure: Secondary | ICD-10-CM | POA: Insufficient documentation

## 2021-10-24 DIAGNOSIS — E785 Hyperlipidemia, unspecified: Secondary | ICD-10-CM | POA: Diagnosis not present

## 2021-10-24 DIAGNOSIS — I2510 Atherosclerotic heart disease of native coronary artery without angina pectoris: Secondary | ICD-10-CM

## 2021-10-24 DIAGNOSIS — I2584 Coronary atherosclerosis due to calcified coronary lesion: Secondary | ICD-10-CM | POA: Diagnosis not present

## 2021-10-24 HISTORY — PX: LEFT HEART CATH AND CORONARY ANGIOGRAPHY: CATH118249

## 2021-10-24 LAB — GLUCOSE, CAPILLARY: Glucose-Capillary: 204 mg/dL — ABNORMAL HIGH (ref 70–99)

## 2021-10-24 SURGERY — LEFT HEART CATH AND CORONARY ANGIOGRAPHY
Anesthesia: LOCAL

## 2021-10-24 MED ORDER — ASPIRIN 81 MG PO CHEW: 81.0000 mg | CHEWABLE_TABLET | ORAL | Status: DC

## 2021-10-24 MED ORDER — SODIUM CHLORIDE 0.9% FLUSH: 3.0000 mL | INTRAVENOUS | Status: DC | PRN

## 2021-10-24 MED ORDER — SODIUM CHLORIDE 0.9 % WEIGHT BASED INFUSION: 1.0000 mL/kg/h | INTRAVENOUS | Status: DC

## 2021-10-24 MED ORDER — METFORMIN HCL 1000 MG PO TABS
1000.0000 mg | ORAL_TABLET | Freq: Two times a day (BID) | ORAL | Status: DC
Start: 2021-10-26 — End: 2021-12-04

## 2021-10-24 MED ORDER — HEPARIN SODIUM (PORCINE) 1000 UNIT/ML IJ SOLN
INTRAMUSCULAR | Status: DC | PRN
  Administered 2021-10-24: 6000 [IU] via INTRAVENOUS

## 2021-10-24 MED ORDER — FENTANYL CITRATE (PF) 100 MCG/2ML IJ SOLN
INTRAMUSCULAR | Status: DC | PRN
Start: 2021-10-24 — End: 2021-10-24
  Administered 2021-10-24: 25 ug via INTRAVENOUS

## 2021-10-24 MED ORDER — ONDANSETRON HCL 4 MG/2ML IJ SOLN: 4.0000 mg | Freq: Four times a day (QID) | INTRAMUSCULAR | Status: DC | PRN

## 2021-10-24 MED ORDER — HYDRALAZINE HCL 20 MG/ML IJ SOLN: 10.0000 mg | INTRAMUSCULAR | Status: DC | PRN

## 2021-10-24 MED ORDER — SODIUM CHLORIDE 0.9 % IV SOLN: INTRAVENOUS | Status: DC

## 2021-10-24 MED ORDER — IOHEXOL 350 MG/ML SOLN
INTRAVENOUS | Status: DC | PRN
  Administered 2021-10-24: 70 mL

## 2021-10-24 MED ORDER — ACETAMINOPHEN 325 MG PO TABS: 650.0000 mg | ORAL_TABLET | ORAL | Status: DC | PRN

## 2021-10-24 MED ORDER — LABETALOL HCL 5 MG/ML IV SOLN: 10.0000 mg | INTRAVENOUS | Status: DC | PRN

## 2021-10-24 MED ORDER — HEPARIN SODIUM (PORCINE) 1000 UNIT/ML IJ SOLN
INTRAMUSCULAR | Status: AC
  Filled 2021-10-24: qty 10

## 2021-10-24 MED ORDER — SODIUM CHLORIDE 0.9 % WEIGHT BASED INFUSION
3.0000 mL/kg/h | INTRAVENOUS | Status: AC
  Administered 2021-10-24: 3 mL/kg/h via INTRAVENOUS

## 2021-10-24 MED ORDER — LIDOCAINE HCL (PF) 1 % IJ SOLN
INTRAMUSCULAR | Status: AC
  Filled 2021-10-24: qty 30

## 2021-10-24 MED ORDER — VERAPAMIL HCL 2.5 MG/ML IV SOLN
INTRAVENOUS | Status: DC | PRN
  Administered 2021-10-24: 10 mL via INTRA_ARTERIAL

## 2021-10-24 MED ORDER — NITROGLYCERIN 1 MG/10 ML FOR IR/CATH LAB
INTRA_ARTERIAL | Status: DC | PRN
  Administered 2021-10-24: 200 ug via INTRA_ARTERIAL

## 2021-10-24 MED ORDER — FENTANYL CITRATE (PF) 100 MCG/2ML IJ SOLN
INTRAMUSCULAR | Status: AC
  Filled 2021-10-24: qty 2

## 2021-10-24 MED ORDER — SODIUM CHLORIDE 0.9% FLUSH: 3.0000 mL | Freq: Two times a day (BID) | INTRAVENOUS | Status: DC

## 2021-10-24 MED ORDER — LIDOCAINE HCL (PF) 1 % IJ SOLN
INTRAMUSCULAR | Status: DC | PRN
  Administered 2021-10-24: 2 mL

## 2021-10-24 MED ORDER — SODIUM CHLORIDE 0.9 % IV SOLN: 250.0000 mL | INTRAVENOUS | Status: DC | PRN

## 2021-10-24 MED ORDER — VERAPAMIL HCL 2.5 MG/ML IV SOLN
INTRAVENOUS | Status: AC
  Filled 2021-10-24: qty 2

## 2021-10-24 MED ORDER — MIDAZOLAM HCL 2 MG/2ML IJ SOLN
INTRAMUSCULAR | Status: DC | PRN
  Administered 2021-10-24: 2 mg via INTRAVENOUS

## 2021-10-24 MED ORDER — MIDAZOLAM HCL 2 MG/2ML IJ SOLN
INTRAMUSCULAR | Status: AC
  Filled 2021-10-24: qty 2

## 2021-10-24 MED ORDER — NITROGLYCERIN 1 MG/10 ML FOR IR/CATH LAB
INTRA_ARTERIAL | Status: AC
  Filled 2021-10-24: qty 10

## 2021-10-24 MED ORDER — RIVAROXABAN 20 MG PO TABS: 20.0000 mg | ORAL_TABLET | Freq: Every day | ORAL | Status: DC

## 2021-10-24 MED ORDER — HEPARIN (PORCINE) IN NACL 1000-0.9 UT/500ML-% IV SOLN
INTRAVENOUS | Status: DC | PRN
  Administered 2021-10-24 (×2): 500 mL

## 2021-10-24 SURGICAL SUPPLY — 11 items
CATH 5FR JL3.5 JR4 ANG PIG MP (CATHETERS) ×1 IMPLANT
DEVICE RAD COMP TR BAND LRG (VASCULAR PRODUCTS) ×1 IMPLANT
GLIDESHEATH SLEND SS 6F .021 (SHEATH) ×1 IMPLANT
GUIDEWIRE INQWIRE 1.5J.035X260 (WIRE) IMPLANT
INQWIRE 1.5J .035X260CM (WIRE) ×2
KIT HEART LEFT (KITS) ×2 IMPLANT
PACK CARDIAC CATHETERIZATION (CUSTOM PROCEDURE TRAY) ×2 IMPLANT
SHEATH PROBE COVER 6X72 (BAG) ×1 IMPLANT
TRANSDUCER W/STOPCOCK (MISCELLANEOUS) ×2 IMPLANT
TUBING CIL FLEX 10 FLL-RA (TUBING) ×2 IMPLANT
WIRE HI TORQ VERSACORE-J 145CM (WIRE) ×1 IMPLANT

## 2021-10-24 NOTE — Progress Notes (Signed)
Patient and wife was given discharge instructions. Both verbalized understanding. 

## 2021-10-24 NOTE — Discharge Instructions (Signed)

## 2021-10-24 NOTE — Interval H&P Note (Signed)
Cath Lab Visit (complete for each Cath Lab visit) ? ?Clinical Evaluation Leading to the Procedure:  ? ?ACS: No. ? ?Non-ACS:   ? ?Anginal Classification: CCS III ? ?Anti-ischemic medical therapy: Minimal Therapy (1 class of medications) ? ?Non-Invasive Test Results: No non-invasive testing performed ? ?Prior CABG: No previous CABG ? ? ? ? ? ?History and Physical Interval Note: ? ?10/24/2021 ?8:37 AM ? ?[name] [name]  has presented today for surgery, with the diagnosis of cad.  The various methods of treatment have been discussed with the patient and family. After consideration of risks, benefits and other options for treatment, the patient has consented to  Procedure(s): ?LEFT HEART CATH AND CORONARY ANGIOGRAPHY (N/A) as a surgical intervention.  The patient's history has been reviewed, patient examined, no change in status, stable for surgery.  I have reviewed the patient's chart and labs.  Questions were answered to the patient's satisfaction.   ? ? ?Larae Grooms ? ? ?

## 2021-10-25 ENCOUNTER — Ambulatory Visit (HOSPITAL_COMMUNITY): Admit: 2021-10-25 | Payer: Medicaid Other | Admitting: Cardiology

## 2021-10-25 ENCOUNTER — Encounter (HOSPITAL_COMMUNITY): Payer: Medicare HMO

## 2021-10-25 SURGERY — ATRIAL FIBRILLATION ABLATION
Anesthesia: General

## 2021-11-04 ENCOUNTER — Encounter: Payer: Self-pay | Admitting: Cardiothoracic Surgery

## 2021-11-04 ENCOUNTER — Institutional Professional Consult (permissible substitution) (INDEPENDENT_AMBULATORY_CARE_PROVIDER_SITE_OTHER): Payer: Medicare HMO | Admitting: Cardiothoracic Surgery

## 2021-11-04 ENCOUNTER — Other Ambulatory Visit: Payer: Self-pay | Admitting: Cardiothoracic Surgery

## 2021-11-04 ENCOUNTER — Other Ambulatory Visit: Payer: Self-pay | Admitting: *Deleted

## 2021-11-04 ENCOUNTER — Encounter: Payer: Self-pay | Admitting: *Deleted

## 2021-11-04 VITALS — BP 147/73 | HR 99 | Resp 20 | Ht 70.0 in | Wt 281.0 lb

## 2021-11-04 DIAGNOSIS — I2510 Atherosclerotic heart disease of native coronary artery without angina pectoris: Secondary | ICD-10-CM

## 2021-11-04 DIAGNOSIS — I480 Paroxysmal atrial fibrillation: Secondary | ICD-10-CM

## 2021-11-04 NOTE — Progress Notes (Signed)
PCP is Cyndi Bender, PA-C ?Referring Provider is Jettie Booze, MD ? ?Chief Complaint  ?Patient presents with  ? Coronary Artery Disease  ?  Initial surgical consult, Cath 5/4, ECHO 2/7  ? ? ?HPI: ?Patient examined, images of coronary arteriogram and echocardiogram personally reviewed and discussed with patient and wife. ?66 year old obese type II diabetic with atrial fibrillation, symptomatic for 6 years.  Has had 2 cardioversions without success.  He was being evaluated for EP ablation and a calcium score cardiac CT scan was performed.  This demonstrated heavy calcification with a score of 4000.  He underwent catheterization subsequently which demonstrated moderate but three-vessel coronary disease.  The patient denies symptoms of classic angina but has noted a decrease in exercise tolerance over the past couple years.  His EF is 50% by echo without valvular disease. ? ?His A-fib is currently controlled with Toprol-XL and he takes Xarelto.  No history of stroke or mini stroke. ? ?Patient has not been hospitalized recently for A-fib, heart failure, or COVID.  He has never had previous surgery.  He denies previous chest trauma. ? ?Patient's last A1c was 7.4.  His weight is 285 pounds and has been stable for months. ? ?He is on inhalers for probable asthma versus COPD and has a remote history of smoking. ? ?Past Medical History:  ?Diagnosis Date  ? Atrial fibrillation ([place])   ? Benign essential HTN 07/30/2014  ? Bradycardia 01/25/2015  ? CHF (congestive heart failure) ([place])   ? Chronic anticoagulation 02/25/2018  ? Chronic systolic CHF (congestive heart failure), NYHA class 3 ([place]) 08/30/2014  ? Overview:  Global ef 30%  ? Class 3 severe obesity in adult [facility] 04/28/2017  ? Diabetes mellitus without complication ([place])   ? Diabetic neuropathy ([place]) 07/28/2014  ? Dysrhythmia   ? Erectile dysfunction   ? High risk medication use 05/26/2018  ? Hypertension   ? Hypertensive heart disease with heart failure ([place])  07/30/2014  ? Lumbago   ? LV dysfunction 04/28/2017  ? Mixed hyperlipidemia 07/28/2014  ? OSA (obstructive sleep apnea)   ? CPAP  ? Paroxysmal atrial fibrillation ([place]) 07/28/2014  ? Sinus node dysfunction ([place]) 04/21/2016  ? Testicular hypofunction   ? Uncontrolled type 2 diabetes mellitus with microalbuminuric diabetic nephropathy 07/28/2014  ? ? ?Past Surgical History:  ?Procedure Laterality Date  ? ELECTROPHYSIOLOGIC STUDY N/A 01/18/2015  ? Procedure: CARDIOVERSION;  Surgeon: [name], MD;  Location: [facility] ORS;  Service: Cardiovascular;  Laterality: N/A;  ? LEFT HEART CATH AND CORONARY ANGIOGRAPHY N/A 10/24/2021  ? Procedure: LEFT HEART CATH AND CORONARY ANGIOGRAPHY;  Surgeon: Jettie Booze, MD;  Location: [facility];  Service: Cardiovascular;  Laterality: N/A;  ? ? ?Family History  ?Problem Relation Age of Onset  ? CAD Brother   ? Drug abuse Brother   ? Lung cancer Father   ? CAD Mother   ? ? ?Social History ?Social History  ? ?Tobacco Use  ? Smoking status: Former  ?  Types: Cigarettes  ?  Quit date: 2007  ?  Years since quitting: 16.3  ? Smokeless tobacco: Never  ?Vaping Use  ? Vaping Use: Never used  ?Substance Use Topics  ? Alcohol use: No  ? Drug use: Yes  ?  Frequency: 1.0 times per week  ?  Types: Marijuana  ? ? ?Current Outpatient Medications  ?Medication Sig Dispense Refill  ? albuterol (VENTOLIN HFA) 108 (90 Base) MCG/ACT inhaler Inhale 2 puffs into the lungs every 6 (  six) hours as needed for wheezing or shortness of breath.    ? BREO ELLIPTA 100-25 MCG/INH AEPB Inhale 1 puff into the lungs daily.  3  ? FARXIGA 10 MG TABS tablet Take 10 mg by mouth daily.    ? glipiZIDE (GLUCOTROL) 10 MG tablet Take 10 mg by mouth 2 (two) times daily before a meal.    ? losartan (COZAAR) 50 MG tablet Take 50 mg by mouth daily.    ? metFORMIN (GLUCOPHAGE) 1000 MG tablet Take 1 tablet (1,000 mg total) by mouth 2 (two) times daily with a meal.    ? metoprolol succinate (TOPROL-XL) 50 MG 24 hr tablet  Take 1 tablet (50 mg total) by mouth daily. Take with or immediately following a meal. 30 tablet 6  ? rivaroxaban (XARELTO) 20 MG TABS tablet Take 1 tablet (20 mg total) by mouth daily with supper. 30 tablet   ? rosuvastatin (CRESTOR) 20 MG tablet Take 20 mg by mouth daily.    ? TRULICITY 1.5 QA/8.3MH SOPN Inject 1.5 mg into the skin once a week.    ? furosemide (LASIX) 20 MG tablet Take 1 tablet (20 mg total) by mouth daily. 90 tablet 3  ? ?No current facility-administered medications for this visit.  ? ? ?No Known Allergies ? ?Review of Systems: ?     ? ?           ? ?Review of Systems :  [ y ] = yes, [  ] = no ? ?      General :  Weight gain [   ]    Weight loss  [   ]  Fatigue [  ]  Fever [  ]  Chills  [  ]                                 ? ? ?       HEENT    Headache [  ]  Dizziness [  ]  Blurred vision [  ] Glaucoma  [  ]   ?                       Nosebleeds [  ] Painful or loose teeth [  ] ? ?      Cardiac :  Chest pain/ pressure [  ]  Resting SOB [  ] exertional SOB [  ] ?                       Orthopnea [  ]  Pedal edema  [  ]  Palpitations [ y ] Syncope/presyncope [ ]  ?                       Paroxysmal nocturnal dyspnea [  ] ? ?       Pulmonary : cough [  ]  wheezing [ y ]  Hemoptysis [  ] Sputum [  ] Snoring [  ] ?                             Pneumothorax [  ]  Sleep apnea [ y uses CPAP] ? ?      GI : Vomiting [  ]  Dysphagia [  ]  Melena  [  ]  Abdominal pain [  ]  BRBPR [  ] ?             Heart burn [  ]  Constipation [  ] Diarrhea  [  ] Colonoscopy [   ] ? ?      GU : Hematuria [  ]  Dysuria [  ]  Nocturia [  ] UTI's [  ] ? ?      Vascular : Claudication [  ]  Rest pain [  ]  DVT [  ] Vein stripping [  ] leg ulcers [  ] ?                         TIA [  ] Stroke [  ]  Varicose veins [  ] ? ?      NEURO :  Headaches  [  ] Seizures [  ] Vision changes [  ] Paresthesias [  ]                                        ? ?      Musculoskeletal :  Arthritis [  ] Gout  [  ]  Back pain [ y ]  Joint pain [  ] ? ?       Skin :  Rash [  ]  Melanoma [  ] Sores [  ] ? ?      Heme : Bleeding problems [  ]Clotting Disorders [  ] Anemia [  ]Blood Transfusion [ ]  ? ?      Endocrine : Diabetes [ y ] Heat or Cold intolerance [  ] Polyuria [  ]excessive thirst [ ]  ? ?      Psych : Depression [  ]  Anxiety [  ]  Psych hospitalizations [  ] Memory change [  ] ?      ?   ?       ?                   ?         ? ?                   ?         ? ? ?BP (!) 147/73 (BP Location: Left Arm, Patient Position: Sitting)   Pulse 99   Resp 20   Ht 5\' 10"  (1.778 m)   Wt 281 lb (127.5 kg)   SpO2 97% Comment: RA  BMI 40.32 kg/m?  ?Physical Exam: ?    ?Physical Exam ? ?General: Obese middle-aged white male no acute distress accompanied by his wife. ?HEENT: Normocephalic pupils equal , dentition adequate with bilateral plates ?Neck: Supple without JVD, adenopathy, or bruit ?Chest: Clear to auscultation, symmetrical breath sounds, no rhonchi, no tenderness ?            or deformity ?Cardiovascular: Regular rate and rhythm, no murmur, no gallop, peripheral pulses ?            palpable in all extremities ?Abdomen:  Soft, large protuberant abdominal contour, nontender, no palpable mass or organomegaly ?Extremities: Warm, well-perfused, no clubbing cyanosis edema or tenderness, ?             no venous stasis changes of the legs ?Rectal/GU: Deferred ?Neuro: Grossly non--focal and symmetrical throughout ?Skin: Clean  and dry without rash or ulceration ? ? ? ?Diagnostic Tests: ?Patient has 70% stenosis of the circumflex, 70% stenosis of the LAD diagonal, 50% stenosis of the proximal RCA normal LVEDP. ? ?Impression: ?Paroxysmal atrial fibrillation approxi-6 years duration ?Cardiac CT calcium score 4000 with moderate three-vessel disease, stenoses 60-70%. ? ?Plan: ?Plan combined multivessel CABG with Maze procedure.  Surgery scheduled for May 23.  Patient will stop his Xarelto 1 week before surgery.  Patient will stop the metformin 48 hours prior to surgery.  He will  need to to complete his preoperative evaluation with PFTs and carotid Dopplers which will be scheduled. ?We discussed the details of the procedure including the benefits and risks and he demonstrates his un

## 2021-11-06 DIAGNOSIS — E782 Mixed hyperlipidemia: Secondary | ICD-10-CM | POA: Diagnosis not present

## 2021-11-06 DIAGNOSIS — I5020 Unspecified systolic (congestive) heart failure: Secondary | ICD-10-CM | POA: Diagnosis not present

## 2021-11-06 DIAGNOSIS — J449 Chronic obstructive pulmonary disease, unspecified: Secondary | ICD-10-CM | POA: Diagnosis not present

## 2021-11-06 DIAGNOSIS — E1140 Type 2 diabetes mellitus with diabetic neuropathy, unspecified: Secondary | ICD-10-CM | POA: Diagnosis not present

## 2021-11-06 DIAGNOSIS — I2510 Atherosclerotic heart disease of native coronary artery without angina pectoris: Secondary | ICD-10-CM | POA: Diagnosis not present

## 2021-11-06 DIAGNOSIS — N183 Chronic kidney disease, stage 3 unspecified: Secondary | ICD-10-CM | POA: Diagnosis not present

## 2021-11-06 DIAGNOSIS — I4891 Unspecified atrial fibrillation: Secondary | ICD-10-CM | POA: Diagnosis not present

## 2021-11-06 DIAGNOSIS — Z125 Encounter for screening for malignant neoplasm of prostate: Secondary | ICD-10-CM | POA: Diagnosis not present

## 2021-11-06 DIAGNOSIS — I10 Essential (primary) hypertension: Secondary | ICD-10-CM | POA: Diagnosis not present

## 2021-11-07 NOTE — Progress Notes (Signed)
Surgical Instructions    Your procedure is scheduled on Tuesday, May 23rd, 2023.   Report to [facility] Main Entrance "A" at 05:45 A.M., then check in with the Admitting office.  Call this number if you have problems the morning of surgery:  [phone]   If you have any questions prior to your surgery date call [phone]: Open Monday-Friday 8am-4pm    Remember:  Do not eat or drink after midnight the night before your surgery    Take these medicines the morning of surgery with A SIP OF WATER:   metoprolol succinate (TOPROL-XL)  rosuvastatin (CRESTOR)  fluticasone furoate-vilanterol (BREO ELLIPTA)   If needed:  albuterol (VENTOLIN HFA)  Please bring all inhalers with you the day of surgery.    Follow your surgeon's instructions on when to stop Xarelto.  If no instructions were given by your surgeon then you will need to call the office to get those instructions.     As of today, STOP taking any Aspirin (unless otherwise instructed by your surgeon) Aleve, Naproxen, Ibuprofen, Motrin, Advil, Goody's, BC's, all herbal medications, fish oil, and all vitamins.  WHAT DO I DO ABOUT MY DIABETES MEDICATION?   Hold FARXIGA the day before surgery and the day of surgery.   Do not take glipiZIDE (GLUCOTROL) the night before surgery and the morning of surgery.  Do not take metFORMIN (GLUCOPHAGE) the day of surgery.   HOW TO MANAGE YOUR DIABETES BEFORE AND AFTER SURGERY  Why is it important to control my blood sugar before and after surgery? Improving blood sugar levels before and after surgery helps healing and can limit problems. A way of improving blood sugar control is eating a healthy diet by:  Eating less sugar and carbohydrates  Increasing activity/exercise  Talking with your doctor about reaching your blood sugar goals High blood sugars (greater than 180 mg/dL) can raise your risk of infections and slow your recovery, so you will need to focus on controlling your  diabetes during the weeks before surgery. Make sure that the doctor who takes care of your diabetes knows about your planned surgery including the date and location.  How do I manage my blood sugar before surgery? Check your blood sugar at least 4 times a day, starting 2 days before surgery, to make sure that the level is not too high or low.  Check your blood sugar the morning of your surgery when you wake up and every 2 hours until you get to the Short Stay unit.  If your blood sugar is less than 70 mg/dL, you will need to treat for low blood sugar: Do not take insulin. Treat a low blood sugar (less than 70 mg/dL) with  cup of clear juice (cranberry or apple), 4 glucose tablets, OR glucose gel. Recheck blood sugar in 15 minutes after treatment (to make sure it is greater than 70 mg/dL). If your blood sugar is not greater than 70 mg/dL on recheck, call [phone] for further instructions. Report your blood sugar to the short stay nurse when you get to Short Stay.  If you are admitted to the hospital after surgery: Your blood sugar will be checked by the staff and you will probably be given insulin after surgery (instead of oral diabetes medicines) to make sure you have good blood sugar levels. The goal for blood sugar control after surgery is 80-180 mg/dL.     The day of surgery:          Do not wear jewelry  Do not wear lotions, powders, colognes, or deodorant. Men may shave face and neck. Do not bring valuables to the hospital.   [facility] is not responsible for any belongings or valuables. .   Do NOT Smoke (Tobacco/Vaping)  24 hours prior to your procedure  If you use a CPAP at night, you may bring your mask for your overnight stay.   Contacts, glasses, hearing aids, dentures or partials may not be worn into surgery, please bring cases for these belongings   For patients admitted to the hospital, discharge time will be determined by your treatment team.   Patients  discharged the day of surgery will not be allowed to drive home, and someone needs to stay with them for 24 hours.   SURGICAL WAITING ROOM VISITATION Patients having surgery or a procedure in a hospital may have two support people. Children under the age of 42 must have an adult with them who is not the patient. They may stay in the waiting area during the procedure and may switch out with other visitors. If the patient needs to stay at the hospital during part of their recovery, the visitor guidelines for inpatient rooms apply.  Please refer to the [facility] website for the visitor guidelines for Inpatients (after your surgery is over and you are in a regular room).    Special instructions:    Oral Hygiene is also important to reduce your risk of infection.  Remember - BRUSH YOUR TEETH THE MORNING OF SURGERY WITH YOUR REGULAR TOOTHPASTE   [facility]- Preparing For Surgery  Before surgery, you can play an important role. Because skin is not sterile, your skin needs to be as free of germs as possible. You can reduce the number of germs on your skin by washing with CHG (chlorahexidine gluconate) Soap before surgery.  CHG is an antiseptic cleaner which kills germs and bonds with the skin to continue killing germs even after washing.     Please do not use if you have an allergy to CHG or antibacterial soaps. If your skin becomes reddened/irritated stop using the CHG.  Do not shave (including legs and underarms) for at least 48 hours prior to first CHG shower. It is OK to shave your face.  Please follow these instructions carefully.     Shower the NIGHT BEFORE SURGERY and the MORNING OF SURGERY with CHG Soap.   If you chose to wash your hair, wash your hair first as usual with your normal shampoo. After you shampoo, rinse your hair and body thoroughly to remove the shampoo.  Then [employer] and genitals (private parts) with your normal soap and rinse thoroughly to remove soap.  After that  Use CHG Soap as you would any other liquid soap. You can apply CHG directly to the skin and wash gently with a scrungie or a clean washcloth.   Apply the CHG Soap to your body ONLY FROM THE NECK DOWN.  Do not use on open wounds or open sores. Avoid contact with your eyes, ears, mouth and genitals (private parts). Wash Face and genitals (private parts)  with your normal soap.   Wash thoroughly, paying special attention to the area where your surgery will be performed.  Thoroughly rinse your body with warm water from the neck down.  DO NOT shower/wash with your normal soap after using and rinsing off the CHG Soap.  Pat yourself dry with a CLEAN TOWEL.  Wear CLEAN PAJAMAS to bed the night before surgery  Place  CLEAN SHEETS on your bed the night before your surgery  DO NOT SLEEP WITH PETS.   Day of Surgery:  Take a shower with CHG soap. Wear Clean/Comfortable clothing the morning of surgery Do not apply any deodorants/lotions.   Remember to brush your teeth WITH YOUR REGULAR TOOTHPASTE.    If you received a COVID test during your pre-op visit, it is requested that you wear a mask when out in public, stay away from anyone that may not be feeling well, and notify your surgeon if you develop symptoms. If you have been in contact with anyone that has tested positive in the last 10 days, please notify your surgeon.    Please read over the following fact sheets that you were given.

## 2021-11-08 ENCOUNTER — Ambulatory Visit (HOSPITAL_COMMUNITY)
Admission: RE | Admit: 2021-11-08 | Discharge: 2021-11-08 | Disposition: A | Discharge status: Home / Self Care | Payer: Medicare HMO | Source: Ambulatory Visit | Attending: Cardiothoracic Surgery | Admitting: Cardiothoracic Surgery

## 2021-11-08 ENCOUNTER — Encounter (HOSPITAL_COMMUNITY): Payer: Self-pay

## 2021-11-08 ENCOUNTER — Encounter (HOSPITAL_COMMUNITY)
Admission: RE | Admit: 2021-11-08 | Discharge: 2021-11-08 | Disposition: A | Discharge status: Home / Self Care | Payer: Medicare HMO | Source: Ambulatory Visit | Attending: Cardiothoracic Surgery | Admitting: Cardiothoracic Surgery

## 2021-11-08 ENCOUNTER — Other Ambulatory Visit: Payer: Self-pay

## 2021-11-08 VITALS — BP 144/94 | HR 102 | Temp 98.0°F | Resp 18 | Ht 70.0 in | Wt 280.2 lb

## 2021-11-08 DIAGNOSIS — Z6841 Body Mass Index (BMI) 40.0 and over, adult: Secondary | ICD-10-CM | POA: Insufficient documentation

## 2021-11-08 DIAGNOSIS — I2510 Atherosclerotic heart disease of native coronary artery without angina pectoris: Secondary | ICD-10-CM | POA: Insufficient documentation

## 2021-11-08 DIAGNOSIS — I480 Paroxysmal atrial fibrillation: Secondary | ICD-10-CM

## 2021-11-08 DIAGNOSIS — Z20822 Contact with and (suspected) exposure to covid-19: Secondary | ICD-10-CM | POA: Insufficient documentation

## 2021-11-08 DIAGNOSIS — Z01818 Encounter for other preprocedural examination: Secondary | ICD-10-CM

## 2021-11-08 DIAGNOSIS — G4733 Obstructive sleep apnea (adult) (pediatric): Secondary | ICD-10-CM | POA: Insufficient documentation

## 2021-11-08 DIAGNOSIS — I495 Sick sinus syndrome: Secondary | ICD-10-CM | POA: Insufficient documentation

## 2021-11-08 DIAGNOSIS — R936 Abnormal findings on diagnostic imaging of limbs: Secondary | ICD-10-CM | POA: Diagnosis not present

## 2021-11-08 DIAGNOSIS — Z87891 Personal history of nicotine dependence: Secondary | ICD-10-CM | POA: Insufficient documentation

## 2021-11-08 DIAGNOSIS — I110 Hypertensive heart disease with heart failure: Secondary | ICD-10-CM | POA: Insufficient documentation

## 2021-11-08 DIAGNOSIS — E1140 Type 2 diabetes mellitus with diabetic neuropathy, unspecified: Secondary | ICD-10-CM | POA: Insufficient documentation

## 2021-11-08 DIAGNOSIS — E785 Hyperlipidemia, unspecified: Secondary | ICD-10-CM | POA: Insufficient documentation

## 2021-11-08 DIAGNOSIS — I5022 Chronic systolic (congestive) heart failure: Secondary | ICD-10-CM | POA: Insufficient documentation

## 2021-11-08 DIAGNOSIS — E1121 Type 2 diabetes mellitus with diabetic nephropathy: Secondary | ICD-10-CM | POA: Diagnosis not present

## 2021-11-08 DIAGNOSIS — G5633 Lesion of radial nerve, bilateral upper limbs: Secondary | ICD-10-CM | POA: Diagnosis not present

## 2021-11-08 DIAGNOSIS — E782 Mixed hyperlipidemia: Secondary | ICD-10-CM | POA: Diagnosis not present

## 2021-11-08 DIAGNOSIS — Z7985 Long-term (current) use of injectable non-insulin antidiabetic drugs: Secondary | ICD-10-CM | POA: Diagnosis not present

## 2021-11-08 DIAGNOSIS — Z7984 Long term (current) use of oral hypoglycemic drugs: Secondary | ICD-10-CM | POA: Insufficient documentation

## 2021-11-08 DIAGNOSIS — G5622 Lesion of ulnar nerve, left upper limb: Secondary | ICD-10-CM | POA: Diagnosis not present

## 2021-11-08 HISTORY — DX: Chronic kidney disease, unspecified: N18.9

## 2021-11-08 HISTORY — DX: Atherosclerotic heart disease of native coronary artery without angina pectoris: I25.10

## 2021-11-08 LAB — PULMONARY FUNCTION TEST
DL/VA % pred: 107 %
DL/VA: 4.39 ml/min/mmHg/L
DLCO cor % pred: 70 %
DLCO cor: 18.63 ml/min/mmHg
DLCO unc % pred: 67 %
DLCO unc: 17.96 ml/min/mmHg
FEF 25-75 Post: 1.95 L/sec
FEF 25-75 Pre: 1.51 L/sec
FEF2575-%Change-Post: 28 %
FEF2575-%Pred-Post: 74 %
FEF2575-%Pred-Pre: 57 %
FEV1-%Change-Post: 5 %
FEV1-%Pred-Post: 58 %
FEV1-%Pred-Pre: 55 %
FEV1-Post: 1.98 L
FEV1-Pre: 1.88 L
FEV1FVC-%Change-Post: 4 %
FEV1FVC-%Pred-Pre: 103 %
FEV6-%Change-Post: 0 %
FEV6-%Pred-Post: 57 %
FEV6-%Pred-Pre: 56 %
FEV6-Post: 2.45 L
FEV6-Pre: 2.44 L
FEV6FVC-%Pred-Post: 105 %
FEV6FVC-%Pred-Pre: 105 %
FVC-%Change-Post: 0 %
FVC-%Pred-Post: 54 %
FVC-%Pred-Pre: 53 %
FVC-Post: 2.45 L
FVC-Pre: 2.44 L
Post FEV1/FVC ratio: 81 %
Post FEV6/FVC ratio: 100 %
Pre FEV1/FVC ratio: 77 %
Pre FEV6/FVC Ratio: 100 %
RV % pred: 98 %
RV: 2.36 L
TLC % pred: 70 %
TLC: 4.95 L

## 2021-11-08 LAB — COMPREHENSIVE METABOLIC PANEL
ALT: 23 U/L (ref 0–44)
AST: 21 U/L (ref 15–41)
Albumin: 4 g/dL (ref 3.5–5.0)
Alkaline Phosphatase: 50 U/L (ref 38–126)
Anion gap: 13 (ref 5–15)
BUN: 29 mg/dL — ABNORMAL HIGH (ref 8–23)
CO2: 20 mmol/L — ABNORMAL LOW (ref 22–32)
Calcium: 9.1 mg/dL (ref 8.9–10.3)
Chloride: 105 mmol/L (ref 98–111)
Creatinine, Ser: 1.59 mg/dL — ABNORMAL HIGH (ref 0.61–1.24)
GFR, Estimated: 47 mL/min — ABNORMAL LOW (ref >60)
Glucose, Bld: 298 mg/dL — ABNORMAL HIGH (ref 70–99)
Potassium: 4.3 mmol/L (ref 3.5–5.1)
Sodium: 138 mmol/L (ref 135–145)
Total Bilirubin: 0.7 mg/dL (ref 0.3–1.2)
Total Protein: 7.1 g/dL (ref 6.5–8.1)

## 2021-11-08 LAB — BLOOD GAS, ARTERIAL
Acid-base deficit: 1.8 mmol/L (ref 0.0–2.0)
Bicarbonate: 23.1 mmol/L (ref 20.0–28.0)
Drawn by: 6086
O2 Saturation: 96.1 %
Patient temperature: 37
pCO2 arterial: 39 mmHg (ref 32–48)
pH, Arterial: 7.38 (ref 7.35–7.45)
pO2, Arterial: 101 mmHg (ref 83–108)

## 2021-11-08 LAB — PROTIME-INR
INR: 0.9 (ref 0.8–1.2)
Prothrombin Time: 12.4 seconds (ref 11.4–15.2)

## 2021-11-08 LAB — URINALYSIS, ROUTINE W REFLEX MICROSCOPIC
Bilirubin Urine: NEGATIVE
Glucose, UA: >=500 mg/dL — AB
Hgb urine dipstick: NEGATIVE
Ketones, ur: 5 mg/dL — AB
Nitrite: NEGATIVE
Protein, ur: NEGATIVE mg/dL
Specific Gravity, Urine: 1.023 (ref 1.005–1.030)
pH: 5 (ref 5.0–8.0)

## 2021-11-08 LAB — SURGICAL PCR SCREEN
MRSA, PCR: NEGATIVE
Staphylococcus aureus: POSITIVE — AB

## 2021-11-08 LAB — CBC
HCT: 41.1 % (ref 39.0–52.0)
Hemoglobin: 13.1 g/dL (ref 13.0–17.0)
MCH: 30.1 pg (ref 26.0–34.0)
MCHC: 31.9 g/dL (ref 30.0–36.0)
MCV: 94.5 fL (ref 80.0–100.0)
Platelets: 230 10*3/uL (ref 150–400)
RBC: 4.35 MIL/uL (ref 4.22–5.81)
RDW: 13.8 % (ref 11.5–15.5)
WBC: 8.7 10*3/uL (ref 4.0–10.5)
nRBC: 0 % (ref 0.0–0.2)

## 2021-11-08 LAB — HEMOGLOBIN A1C
Hgb A1c MFr Bld: 9.3 % — ABNORMAL HIGH (ref 4.8–5.6)
Mean Plasma Glucose: 220.21 mg/dL

## 2021-11-08 LAB — APTT: aPTT: 26 seconds (ref 24–36)

## 2021-11-08 LAB — GLUCOSE, CAPILLARY: Glucose-Capillary: 344 mg/dL — ABNORMAL HIGH (ref 70–99)

## 2021-11-08 LAB — SARS CORONAVIRUS 2 (TAT 6-24 HRS): SARS Coronavirus 2: NEGATIVE

## 2021-11-08 MED ORDER — ALBUTEROL SULFATE (2.5 MG/3ML) 0.083% IN NEBU
2.5000 mg | INHALATION_SOLUTION | Freq: Once | RESPIRATORY_TRACT | Status: AC
  Administered 2021-11-08: 2.5 mg via RESPIRATORY_TRACT

## 2021-11-08 NOTE — Progress Notes (Signed)
PCP - Cyndi Bender PA Cardiologist - Shirlee More MD  PPM/ICD - denies Device Orders -  Rep Notified -   Chest x-ray - 11/08/21 EKG - 10/21/21 Stress Test - none ECHO - 07/30/21 Cardiac Cath - 10/24/21  Sleep Study - "4 or 5 years ago" CPAP - yes  Fasting Blood Sugar - pt is not sure ;rarely checks his sugar. Checks Blood Sugar about once a month  Blood Thinner Instructions: per pt he received instructions from Dr. Darcey Nora for Xarelto. Last dose was 5/16. Aspirin Instructions:na  ERAS Protcol -no PRE-SURGERY Ensure or G2-   COVID TEST- 5/19/233   Anesthesia review: yes-cardiac history.  Patient denies shortness of breath, fever, cough and chest pain at PAT appointment   All instructions explained to the patient, with a verbal understanding of the material. Patient agrees to go over the instructions while at home for a better understanding. Patient also instructed to wear a mask when out in public after being tested for COVID-19. The opportunity to ask questions was provided.

## 2021-11-08 NOTE — Progress Notes (Signed)
Pre-CABG Dopplers completed. Refer to "CV Proc" under chart review to view preliminary results.  11/08/2021 9:49 AM Kelby Aline., MHA, RVT, RDCS, RDMS

## 2021-11-11 ENCOUNTER — Encounter (HOSPITAL_COMMUNITY): Payer: Self-pay

## 2021-11-11 ENCOUNTER — Other Ambulatory Visit: Payer: Self-pay | Admitting: *Deleted

## 2021-11-11 MED ORDER — TRANEXAMIC ACID (OHS) PUMP PRIME SOLUTION
2.0000 mg/kg | INTRAVENOUS | Status: DC
  Filled 2021-11-11: qty 1.89

## 2021-11-11 MED ORDER — TRANEXAMIC ACID 1000 MG/10ML IV SOLN
1.5000 mg/kg/h | INTRAVENOUS | Status: AC
  Administered 2021-11-12 (×2): 1.5 mg/kg/h via INTRAVENOUS
  Filled 2021-11-11 (×2): qty 25

## 2021-11-11 MED ORDER — MILRINONE LACTATE IN DEXTROSE 20-5 MG/100ML-% IV SOLN
0.3000 ug/kg/min | INTRAVENOUS | Status: AC
  Administered 2021-11-12: .25 ug/kg/min via INTRAVENOUS
  Filled 2021-11-11: qty 100

## 2021-11-11 MED ORDER — HEPARIN 30,000 UNITS/1000 ML (OHS) CELLSAVER SOLUTION
Status: DC
  Filled 2021-11-11: qty 1000

## 2021-11-11 MED ORDER — NOREPINEPHRINE 4 MG/250ML-% IV SOLN
0.0000 ug/min | INTRAVENOUS | Status: DC
  Filled 2021-11-11: qty 250

## 2021-11-11 MED ORDER — POTASSIUM CHLORIDE 2 MEQ/ML IV SOLN
80.0000 meq | INTRAVENOUS | Status: DC
Start: 2021-11-12 — End: 2021-11-12
  Filled 2021-11-11: qty 40

## 2021-11-11 MED ORDER — PHENYLEPHRINE HCL-NACL 20-0.9 MG/250ML-% IV SOLN
30.0000 ug/min | INTRAVENOUS | Status: AC
  Administered 2021-11-12: 40 ug/min via INTRAVENOUS
  Filled 2021-11-11: qty 250

## 2021-11-11 MED ORDER — NITROGLYCERIN IN D5W 200-5 MCG/ML-% IV SOLN
2.0000 ug/min | INTRAVENOUS | Status: DC
  Filled 2021-11-11: qty 250

## 2021-11-11 MED ORDER — MAGNESIUM SULFATE 50 % IJ SOLN
40.0000 meq | INTRAMUSCULAR | Status: DC
  Filled 2021-11-11: qty 9.85

## 2021-11-11 MED ORDER — DEXMEDETOMIDINE HCL IN NACL 400 MCG/100ML IV SOLN
0.1000 ug/kg/h | INTRAVENOUS | Status: AC
  Administered 2021-11-12: .9 ug/kg/h via INTRAVENOUS
  Administered 2021-11-12: .5 ug/kg/h via INTRAVENOUS
  Filled 2021-11-11: qty 100

## 2021-11-11 MED ORDER — CEFAZOLIN IN SODIUM CHLORIDE 3-0.9 GM/100ML-% IV SOLN
3.0000 g | INTRAVENOUS | Status: AC
  Administered 2021-11-12 (×2): 3 g via INTRAVENOUS
  Filled 2021-11-11: qty 100

## 2021-11-11 MED ORDER — PAPAVERINE HCL 30 MG/ML IJ SOLN
INTRAMUSCULAR | Status: DC
  Filled 2021-11-11: qty 2.5

## 2021-11-11 MED ORDER — INSULIN REGULAR(HUMAN) IN NACL 100-0.9 UT/100ML-% IV SOLN
INTRAVENOUS | Status: AC
  Administered 2021-11-12: 9.5 [IU]/h via INTRAVENOUS
  Filled 2021-11-11: qty 100

## 2021-11-11 MED ORDER — TRANEXAMIC ACID (OHS) BOLUS VIA INFUSION
15.0000 mg/kg | INTRAVENOUS | Status: AC
  Administered 2021-11-12: 1095 mg via INTRAVENOUS
  Filled 2021-11-11: qty 1095

## 2021-11-11 MED ORDER — VANCOMYCIN HCL 1500 MG/300ML IV SOLN
1500.0000 mg | INTRAVENOUS | Status: AC
  Administered 2021-11-12: 1500 mg via INTRAVENOUS
  Filled 2021-11-11: qty 300

## 2021-11-11 MED ORDER — CEFAZOLIN SODIUM-DEXTROSE 2-4 GM/100ML-% IV SOLN
2.0000 g | INTRAVENOUS | Status: DC
Start: 2021-11-12 — End: 2021-11-12
  Filled 2021-11-11: qty 100

## 2021-11-11 MED ORDER — EPINEPHRINE HCL 5 MG/250ML IV SOLN IN NS
0.0000 ug/min | INTRAVENOUS | Status: DC
  Filled 2021-11-11: qty 250

## 2021-11-11 NOTE — Anesthesia Preprocedure Evaluation (Signed)
Anesthesia Evaluation  Patient identified by MRN, date of birth, ID band Patient awake    Reviewed: Allergy & Precautions, H&P , NPO status , Patient's Chart, lab work & pertinent test results  Airway Mallampati: II   Neck ROM: full    Dental   Pulmonary sleep apnea , former smoker,    breath sounds clear to auscultation       Cardiovascular hypertension, + CAD and +CHF  + dysrhythmias Atrial Fibrillation  Rhythm:regular Rate:Normal  TTE: EF 50-55%   Neuro/Psych    GI/Hepatic   Endo/Other  diabetes, Type 2Morbid obesity  Renal/GU Renal InsufficiencyRenal disease     Musculoskeletal   Abdominal   Peds  Hematology   Anesthesia Other Findings   Reproductive/Obstetrics                            Anesthesia Physical Anesthesia Plan  ASA: 3  Anesthesia Plan: General   Post-op Pain Management:    Induction: Intravenous  PONV Risk Score and Plan: 2 and Ondansetron, Dexamethasone, Midazolam and Treatment may vary due to age or medical condition  Airway Management Planned: Oral ETT  Additional Equipment: Arterial line, CVP, PA Cath, TEE and Ultrasound Guidance Line Placement  Intra-op Plan:   Post-operative Plan: Post-operative intubation/ventilation  Informed Consent: I have reviewed the patients History and Physical, chart, labs and discussed the procedure including the risks, benefits and alternatives for the proposed anesthesia with the patient or authorized representative who has indicated his/her understanding and acceptance.     Dental advisory given  Plan Discussed with: CRNA, Anesthesiologist and Surgeon  Anesthesia Plan Comments: (PAT note written 11/11/2021 by Myra Gianotti, PA-C. )       Anesthesia Quick Evaluation

## 2021-11-11 NOTE — Progress Notes (Signed)
Anesthesia Chart Review:  Case: [id number] Date/Time: 11/12/21 0730   Procedures:      CORONARY ARTERY BYPASS GRAFTING (CABG) (Chest)     MAZE     TRANSESOPHAGEAL ECHOCARDIOGRAM (TEE)   Anesthesia type: General   Pre-op diagnosis: CAD   Location: [facility] OR ROOM 33 / [place] [facility]   Surgeons: Dahlia Byes, MD       DISCUSSION: Patient is a 66 year old male scheduled for the above procedure.  History includes former smoker, CAD, chronic systolic CHF (in setting of afib with normalization of EF after 2016 DCCV; recurrent afib 05/09/21), atrial fibrillation/PAF (diagnosed ~ 07/2014; s/p DCCV 01/18/15, recurrent 05/09/21), sinus node dysfunction/bradycardia, HTN, HLD, DM2 with neuropathy, obesity, OSA (uses CPAP).  Preoperative labs show Cr 1.59 which appears overall stable (previously Cr 1.54 and 1.31 since 10/08/21). Random CBG 344 with subsequent serum glucose 298 with A1c 9.3%. He rarely checks home CBGs. Notified TCTS RN Ryan of A1c results, and she will have Dr. Prescott Gum review.  Reported last Xarelto 11/05/2021.  11/08/2021 presurgical COVID-19 test negative.  Anesthesia team to evaluate on the day of surgery.  He will get a CBG on arrival.   VS: BP (!) 144/94   Pulse (!) 102   Temp 36.7 C (Oral)   Resp 18   Ht 5\' 10"  (1.778 m)   Wt 127.1 kg   SpO2 99%   BMI 40.20 kg/m    PROVIDERS: Cyndi Bender, PA-C is PCP  Allegra Lai, MD is EP cardiologist Shirlee More, MD is cardiologist Gardiner Rhyme, MD is pulmonologist He is also followed by nephrology for CKD stage 3-4.   LABS: Preoperative labs noted.  See DISCUSSION. (all labs ordered are listed, but only abnormal results are displayed)  Labs Reviewed  SURGICAL PCR SCREEN - Abnormal; Notable for the following components:      Result Value   Staphylococcus aureus POSITIVE (*)    All other components within normal limits  GLUCOSE, CAPILLARY - Abnormal; Notable for the following components:   Glucose-Capillary 344 (*)    All other  components within normal limits  COMPREHENSIVE METABOLIC PANEL - Abnormal; Notable for the following components:   CO2 20 (*)    Glucose, Bld 298 (*)    BUN 29 (*)    Creatinine, Ser 1.59 (*)    GFR, Estimated 47 (*)    All other components within normal limits  URINALYSIS, ROUTINE W REFLEX MICROSCOPIC - Abnormal; Notable for the following components:   Color, Urine STRAW (*)    Glucose, UA >=500 (*)    Ketones, ur 5 (*)    Leukocytes,Ua TRACE (*)    Bacteria, UA RARE (*)    All other components within normal limits  HEMOGLOBIN A1C - Abnormal; Notable for the following components:   Hgb A1c MFr Bld 9.3 (*)    All other components within normal limits  SARS CORONAVIRUS 2 (TAT 6-24 HRS)  CBC  PROTIME-INR  APTT  BLOOD GAS, ARTERIAL  TYPE AND SCREEN   PFTs 11/08/21: FVC 2.44 (53%), post 2.45 (54$). FEV1 1.88 (55%), post 1.98 (58%). DLCO unc 17.96 (67%), cor 18.63 (70%).   IMAGES: CXR 11/08/21: FINDINGS: No focal consolidation, pleural effusion or pneumothorax. The cardiac silhouette is within limits. Atherosclerotic calcification of the aortic arch. No acute osseous pathology. IMPRESSION: No active cardiopulmonary disease.    EKG: 10/21/21: AFib at 112 bpm   CV: US Carotid 11/08/21: Summary:  - Right Carotid: Velocities in the right ICA are consistent with a  1-39%  stenosis.  - Left Carotid: Velocities in the left ICA are consistent with a 1-39%  stenosis.  - Vertebrals:  Bilateral vertebral arteries demonstrate antegrade flow.  - Subclavians: Normal flow hemodynamics were seen in bilateral subclavian               arteries.    LHC 10/24/21:   Ost LM to Mid LM lesion is 50% stenosed.   Dist LM lesion is 75% stenosed.   Ost LAD to Prox LAD lesion is 50% stenosed.  Ectatic segment with eccentric calcium.   Ost Cx to Prox Cx lesion is 70% stenosed.   Mid LAD lesion is 50% stenosed.   Prox RCA to Mid RCA lesion is 70% stenosed.   LV end diastolic pressure is normal.    There is no aortic valve stenosis.   - Eccentric, severely calcified left main disease extending into the ostial LAD.  There is also ostial disease and a relatively small circumflex vessel.  Diffuse, calcific disease in the mid RCA today. - Patient was initially scheduled for A-fib ablation.  Plan now for cardiac surgery consult with consideration of CABG, maze and left atrial appendage clip.   CT Cardiac (ordered for anticipated ablation) 10/18/21: IMPRESSION: 1. There is normal pulmonary vein drainage into the left atrium. No pulmonary vein stenosis. 2. Normal left atrial appendage, no left atrial appendage thrombus. No intracardiac mass or thrombus. 3. The esophagus runs in the left atrial midline and is not in the proximity to any of the pulmonary veins. 4. Coronary artery calcium score is 4439, which is 99th percentile for age and sex matched peers. Dense 3 vessel coronary calcifications involving the proximal vessels including left main coronary artery. 5. Mild dilation of main pulmonary artery, 30 mm, may indicate increased pulmonary pressure. - Referred for LHC by Dr. Curt Bears.    Echo 07/30/21: IMPRESSIONS   1. Left ventricular ejection fraction, by estimation, is 50 to 55%. The  left ventricle has low normal function. Left ventricular endocardial  border not optimally defined to evaluate regional wall motion. Left  ventricular diastolic parameters are  indeterminate.   2. Right ventricular systolic function was not well visualized. The right  ventricular size is normal.   3. The mitral valve is degenerative. No evidence of mitral valve  regurgitation. No evidence of mitral stenosis.   4. The aortic valve has an indeterminant number of cusps. Aortic valve  regurgitation is not visualized. Aortic valve sclerosis/calcification is  present, without any evidence of aortic stenosis.  - Comparison LVEF 60-65% 04/12/18; 30% 08/11/14.   Past Medical History:  Diagnosis Date    Atrial fibrillation ([place])    Benign essential HTN 07/30/2014   Bradycardia 01/25/2015   CHF (congestive heart failure) (HCC)    Chronic anticoagulation 02/63/7858   Chronic systolic CHF (congestive heart failure), NYHA class 3 ([place]) 08/30/2014   Overview:  Global ef 30%   Class 3 severe obesity in adult ([place]) 04/28/2017   Diabetes mellitus without complication ([place])    Diabetic neuropathy ([place]) 07/28/2014   Dysrhythmia    Erectile dysfunction    High risk medication use 05/26/2018   Hypertension    Hypertensive heart disease with heart failure ([place]) 07/30/2014   Lumbago    LV dysfunction 04/28/2017   Mixed hyperlipidemia 07/28/2014   OSA (obstructive sleep apnea)    CPAP   Paroxysmal atrial fibrillation ([place]) 07/28/2014   Sinus node dysfunction ([place]) 04/21/2016   Testicular hypofunction    Uncontrolled type  2 diabetes mellitus with microalbuminuric diabetic nephropathy 07/28/2014    Past Surgical History:  Procedure Laterality Date   ELECTROPHYSIOLOGIC STUDY N/A 01/18/2015   Procedure: CARDIOVERSION;  Surgeon: [name], MD;  Location: [facility] ORS;  Service: Cardiovascular;  Laterality: N/A;   EYE SURGERY Bilateral 2022   LEFT HEART CATH AND CORONARY ANGIOGRAPHY N/A 10/24/2021   Procedure: LEFT HEART CATH AND CORONARY ANGIOGRAPHY;  Surgeon: Jettie Booze, MD;  Location: [facility] CV LAB;  Service: Cardiovascular;  Laterality: N/A;    MEDICATIONS:  albuterol (VENTOLIN HFA) 108 (90 Base) MCG/ACT inhaler   Dulaglutide (TRULICITY) 3 UT/6.5YY SOPN   FARXIGA 10 MG TABS tablet   fluticasone furoate-vilanterol (BREO ELLIPTA) 200-25 MCG/ACT AEPB   furosemide (LASIX) 20 MG tablet   glipiZIDE (GLUCOTROL) 10 MG tablet   losartan (COZAAR) 50 MG tablet   metFORMIN (GLUCOPHAGE) 1000 MG tablet   metoprolol succinate (TOPROL-XL) 50 MG 24 hr tablet   rivaroxaban (XARELTO) 20 MG TABS tablet   rosuvastatin (CRESTOR) 20 MG tablet   No current facility-administered medications  for this encounter.    [START ON 11/12/2021] ceFAZolin (ANCEF) IVPB 2g/100 mL premix   [START ON 11/12/2021] ceFAZolin (ANCEF) IVPB 3g/100 mL premix   [START ON 11/12/2021] dexmedetomidine (PRECEDEX) 400 MCG/100ML (4 mcg/mL) infusion   [START ON 11/12/2021] EPINEPHrine (ADRENALIN) 5 mg in NS 250 mL (0.02 mg/mL) premix infusion   [START ON 11/12/2021] heparin 30,000 units/NS 1000 mL solution for CELLSAVER   [START ON 11/12/2021] heparin sodium (porcine) 2,500 Units, papaverine 30 mg in electrolyte-A (PLASMALYTE-A PH 7.4) 500 mL irrigation   [START ON 11/12/2021] insulin regular, human (MYXREDLIN) 100 units/ 100 mL infusion   [START ON 11/12/2021] magnesium sulfate (IV Push/IM) injection 40 mEq   [START ON 11/12/2021] milrinone (PRIMACOR) 20 MG/100 ML (0.2 mg/mL) infusion   [START ON 11/12/2021] nitroGLYCERIN 50 mg in dextrose 5 % 250 mL (0.2 mg/mL) infusion   [START ON 11/12/2021] norepinephrine (LEVOPHED) 4mg  in 263mL (0.016 mg/mL) premix infusion   [START ON 11/12/2021] phenylephrine (NEO-SYNEPHRINE) 20mg /NS 252mL premix infusion   [START ON 11/12/2021] potassium chloride injection 80 mEq   [START ON 11/12/2021] tranexamic acid (CYKLOKAPRON) 2,500 mg in sodium chloride 0.9 % 250 mL (10 mg/mL) infusion   [START ON 11/12/2021] tranexamic acid (CYKLOKAPRON) bolus via infusion - over 30 minutes 1,095 mg   [START ON 11/12/2021] tranexamic acid (CYKLOKAPRON) pump prime solution 189 mg   [START ON 11/12/2021] vancomycin (VANCOREADY) IVPB 1500 mg/300 mL    Myra Gianotti, PA-C Surgical Short Stay/Anesthesiology [facility] Phone [phone] [facility] Phone [phone] 11/11/2021 10:24 AM

## 2021-11-12 ENCOUNTER — Inpatient Hospital Stay (HOSPITAL_COMMUNITY)
Admission: RE | Disposition: A | Discharge status: Home / Self Care | Payer: Self-pay | Source: Home / Self Care | Attending: Cardiothoracic Surgery

## 2021-11-12 ENCOUNTER — Inpatient Hospital Stay (HOSPITAL_COMMUNITY): Payer: Medicare HMO | Admitting: Anesthesiology

## 2021-11-12 ENCOUNTER — Inpatient Hospital Stay (HOSPITAL_COMMUNITY): Payer: Medicare HMO

## 2021-11-12 ENCOUNTER — Encounter (HOSPITAL_COMMUNITY): Payer: Self-pay | Admitting: Cardiothoracic Surgery

## 2021-11-12 ENCOUNTER — Other Ambulatory Visit: Payer: Self-pay

## 2021-11-12 ENCOUNTER — Inpatient Hospital Stay (HOSPITAL_COMMUNITY)
Admission: RE | Admit: 2021-11-12 | Discharge: 2021-12-04 | DRG: 233 | Disposition: A | Discharge status: Home / Self Care | Payer: Medicare HMO | Attending: Cardiothoracic Surgery | Admitting: Cardiothoracic Surgery

## 2021-11-12 DIAGNOSIS — E1165 Type 2 diabetes mellitus with hyperglycemia: Secondary | ICD-10-CM | POA: Diagnosis not present

## 2021-11-12 DIAGNOSIS — I517 Cardiomegaly: Secondary | ICD-10-CM | POA: Diagnosis not present

## 2021-11-12 DIAGNOSIS — Q2112 Patent foramen ovale: Secondary | ICD-10-CM | POA: Diagnosis not present

## 2021-11-12 DIAGNOSIS — I2510 Atherosclerotic heart disease of native coronary artery without angina pectoris: Secondary | ICD-10-CM | POA: Diagnosis not present

## 2021-11-12 DIAGNOSIS — Z8249 Family history of ischemic heart disease and other diseases of the circulatory system: Secondary | ICD-10-CM

## 2021-11-12 DIAGNOSIS — M25552 Pain in left hip: Secondary | ICD-10-CM | POA: Diagnosis not present

## 2021-11-12 DIAGNOSIS — E871 Hypo-osmolality and hyponatremia: Secondary | ICD-10-CM | POA: Diagnosis not present

## 2021-11-12 DIAGNOSIS — D649 Anemia, unspecified: Secondary | ICD-10-CM | POA: Diagnosis not present

## 2021-11-12 DIAGNOSIS — Y838 Other surgical procedures as the cause of abnormal reaction of the patient, or of later complication, without mention of misadventure at the time of the procedure: Secondary | ICD-10-CM | POA: Diagnosis not present

## 2021-11-12 DIAGNOSIS — I270 Primary pulmonary hypertension: Secondary | ICD-10-CM | POA: Diagnosis not present

## 2021-11-12 DIAGNOSIS — J9811 Atelectasis: Secondary | ICD-10-CM | POA: Diagnosis not present

## 2021-11-12 DIAGNOSIS — J984 Other disorders of lung: Secondary | ICD-10-CM | POA: Diagnosis not present

## 2021-11-12 DIAGNOSIS — Z7985 Long-term (current) use of injectable non-insulin antidiabetic drugs: Secondary | ICD-10-CM

## 2021-11-12 DIAGNOSIS — Z452 Encounter for adjustment and management of vascular access device: Secondary | ICD-10-CM | POA: Diagnosis not present

## 2021-11-12 DIAGNOSIS — K661 Hemoperitoneum: Secondary | ICD-10-CM | POA: Diagnosis not present

## 2021-11-12 DIAGNOSIS — I5043 Acute on chronic combined systolic (congestive) and diastolic (congestive) heart failure: Secondary | ICD-10-CM | POA: Diagnosis not present

## 2021-11-12 DIAGNOSIS — R6521 Severe sepsis with septic shock: Secondary | ICD-10-CM | POA: Diagnosis not present

## 2021-11-12 DIAGNOSIS — Z9989 Dependence on other enabling machines and devices: Secondary | ICD-10-CM | POA: Diagnosis not present

## 2021-11-12 DIAGNOSIS — E1140 Type 2 diabetes mellitus with diabetic neuropathy, unspecified: Secondary | ICD-10-CM | POA: Diagnosis present

## 2021-11-12 DIAGNOSIS — G8929 Other chronic pain: Secondary | ICD-10-CM | POA: Diagnosis present

## 2021-11-12 DIAGNOSIS — J90 Pleural effusion, not elsewhere classified: Secondary | ICD-10-CM | POA: Diagnosis not present

## 2021-11-12 DIAGNOSIS — Z801 Family history of malignant neoplasm of trachea, bronchus and lung: Secondary | ICD-10-CM

## 2021-11-12 DIAGNOSIS — Z6839 Body mass index (BMI) 39.0-39.9, adult: Secondary | ICD-10-CM | POA: Diagnosis not present

## 2021-11-12 DIAGNOSIS — I472 Ventricular tachycardia, unspecified: Secondary | ICD-10-CM | POA: Diagnosis not present

## 2021-11-12 DIAGNOSIS — R0989 Other specified symptoms and signs involving the circulatory and respiratory systems: Secondary | ICD-10-CM | POA: Diagnosis not present

## 2021-11-12 DIAGNOSIS — J918 Pleural effusion in other conditions classified elsewhere: Secondary | ICD-10-CM | POA: Diagnosis not present

## 2021-11-12 DIAGNOSIS — J9601 Acute respiratory failure with hypoxia: Secondary | ICD-10-CM | POA: Diagnosis not present

## 2021-11-12 DIAGNOSIS — T8242XA Displacement of vascular dialysis catheter, initial encounter: Secondary | ICD-10-CM | POA: Diagnosis not present

## 2021-11-12 DIAGNOSIS — I700 Atherosclerosis of aorta: Secondary | ICD-10-CM | POA: Diagnosis not present

## 2021-11-12 DIAGNOSIS — I480 Paroxysmal atrial fibrillation: Secondary | ICD-10-CM

## 2021-11-12 DIAGNOSIS — F064 Anxiety disorder due to known physiological condition: Secondary | ICD-10-CM | POA: Diagnosis not present

## 2021-11-12 DIAGNOSIS — I5033 Acute on chronic diastolic (congestive) heart failure: Secondary | ICD-10-CM | POA: Diagnosis not present

## 2021-11-12 DIAGNOSIS — N179 Acute kidney failure, unspecified: Secondary | ICD-10-CM | POA: Diagnosis not present

## 2021-11-12 DIAGNOSIS — G4733 Obstructive sleep apnea (adult) (pediatric): Secondary | ICD-10-CM | POA: Diagnosis not present

## 2021-11-12 DIAGNOSIS — Z7901 Long term (current) use of anticoagulants: Secondary | ICD-10-CM

## 2021-11-12 DIAGNOSIS — R34 Anuria and oliguria: Secondary | ICD-10-CM | POA: Diagnosis not present

## 2021-11-12 DIAGNOSIS — R609 Edema, unspecified: Secondary | ICD-10-CM | POA: Diagnosis not present

## 2021-11-12 DIAGNOSIS — N19 Unspecified kidney failure: Secondary | ICD-10-CM | POA: Diagnosis not present

## 2021-11-12 DIAGNOSIS — D62 Acute posthemorrhagic anemia: Secondary | ICD-10-CM | POA: Diagnosis not present

## 2021-11-12 DIAGNOSIS — Z4682 Encounter for fitting and adjustment of non-vascular catheter: Secondary | ICD-10-CM | POA: Diagnosis not present

## 2021-11-12 DIAGNOSIS — E611 Iron deficiency: Secondary | ICD-10-CM | POA: Diagnosis not present

## 2021-11-12 DIAGNOSIS — A419 Sepsis, unspecified organism: Secondary | ICD-10-CM | POA: Diagnosis not present

## 2021-11-12 DIAGNOSIS — Y95 Nosocomial condition: Secondary | ICD-10-CM | POA: Diagnosis not present

## 2021-11-12 DIAGNOSIS — J939 Pneumothorax, unspecified: Secondary | ICD-10-CM | POA: Diagnosis not present

## 2021-11-12 DIAGNOSIS — L7632 Postprocedural hematoma of skin and subcutaneous tissue following other procedure: Secondary | ICD-10-CM | POA: Diagnosis not present

## 2021-11-12 DIAGNOSIS — K5939 Other megacolon: Secondary | ICD-10-CM | POA: Diagnosis not present

## 2021-11-12 DIAGNOSIS — I5020 Unspecified systolic (congestive) heart failure: Secondary | ICD-10-CM | POA: Diagnosis not present

## 2021-11-12 DIAGNOSIS — I440 Atrioventricular block, first degree: Secondary | ICD-10-CM | POA: Diagnosis not present

## 2021-11-12 DIAGNOSIS — T8143XA Infection following a procedure, organ and space surgical site, initial encounter: Secondary | ICD-10-CM | POA: Diagnosis not present

## 2021-11-12 DIAGNOSIS — N170 Acute kidney failure with tubular necrosis: Secondary | ICD-10-CM | POA: Diagnosis not present

## 2021-11-12 DIAGNOSIS — I340 Nonrheumatic mitral (valve) insufficiency: Secondary | ICD-10-CM | POA: Diagnosis not present

## 2021-11-12 DIAGNOSIS — K429 Umbilical hernia without obstruction or gangrene: Secondary | ICD-10-CM | POA: Diagnosis not present

## 2021-11-12 DIAGNOSIS — I5031 Acute diastolic (congestive) heart failure: Secondary | ICD-10-CM | POA: Diagnosis not present

## 2021-11-12 DIAGNOSIS — Z79899 Other long term (current) drug therapy: Secondary | ICD-10-CM

## 2021-11-12 DIAGNOSIS — Z7984 Long term (current) use of oral hypoglycemic drugs: Secondary | ICD-10-CM

## 2021-11-12 DIAGNOSIS — I081 Rheumatic disorders of both mitral and tricuspid valves: Secondary | ICD-10-CM | POA: Diagnosis not present

## 2021-11-12 DIAGNOSIS — I130 Hypertensive heart and chronic kidney disease with heart failure and stage 1 through stage 4 chronic kidney disease, or unspecified chronic kidney disease: Secondary | ICD-10-CM | POA: Diagnosis present

## 2021-11-12 DIAGNOSIS — E119 Type 2 diabetes mellitus without complications: Secondary | ICD-10-CM

## 2021-11-12 DIAGNOSIS — N200 Calculus of kidney: Secondary | ICD-10-CM | POA: Diagnosis not present

## 2021-11-12 DIAGNOSIS — K6812 Psoas muscle abscess: Secondary | ICD-10-CM | POA: Diagnosis not present

## 2021-11-12 DIAGNOSIS — M549 Dorsalgia, unspecified: Secondary | ICD-10-CM | POA: Diagnosis not present

## 2021-11-12 DIAGNOSIS — R570 Cardiogenic shock: Secondary | ICD-10-CM | POA: Diagnosis not present

## 2021-11-12 DIAGNOSIS — J811 Chronic pulmonary edema: Secondary | ICD-10-CM | POA: Diagnosis not present

## 2021-11-12 DIAGNOSIS — D72829 Elevated white blood cell count, unspecified: Secondary | ICD-10-CM | POA: Diagnosis not present

## 2021-11-12 DIAGNOSIS — R079 Chest pain, unspecified: Secondary | ICD-10-CM | POA: Diagnosis not present

## 2021-11-12 DIAGNOSIS — N1831 Chronic kidney disease, stage 3a: Secondary | ICD-10-CM | POA: Diagnosis present

## 2021-11-12 DIAGNOSIS — K6389 Other specified diseases of intestine: Secondary | ICD-10-CM | POA: Diagnosis not present

## 2021-11-12 DIAGNOSIS — I4819 Other persistent atrial fibrillation: Secondary | ICD-10-CM | POA: Diagnosis present

## 2021-11-12 DIAGNOSIS — K120 Recurrent oral aphthae: Secondary | ICD-10-CM | POA: Diagnosis not present

## 2021-11-12 DIAGNOSIS — I110 Hypertensive heart disease with heart failure: Secondary | ICD-10-CM | POA: Diagnosis not present

## 2021-11-12 DIAGNOSIS — E1122 Type 2 diabetes mellitus with diabetic chronic kidney disease: Secondary | ICD-10-CM | POA: Diagnosis present

## 2021-11-12 DIAGNOSIS — I509 Heart failure, unspecified: Secondary | ICD-10-CM | POA: Diagnosis not present

## 2021-11-12 DIAGNOSIS — K4020 Bilateral inguinal hernia, without obstruction or gangrene, not specified as recurrent: Secondary | ICD-10-CM | POA: Diagnosis not present

## 2021-11-12 DIAGNOSIS — R579 Shock, unspecified: Secondary | ICD-10-CM | POA: Diagnosis not present

## 2021-11-12 DIAGNOSIS — I4892 Unspecified atrial flutter: Secondary | ICD-10-CM | POA: Diagnosis not present

## 2021-11-12 DIAGNOSIS — E782 Mixed hyperlipidemia: Secondary | ICD-10-CM | POA: Diagnosis present

## 2021-11-12 DIAGNOSIS — M47814 Spondylosis without myelopathy or radiculopathy, thoracic region: Secondary | ICD-10-CM | POA: Diagnosis not present

## 2021-11-12 DIAGNOSIS — K567 Ileus, unspecified: Secondary | ICD-10-CM | POA: Diagnosis not present

## 2021-11-12 DIAGNOSIS — R0602 Shortness of breath: Secondary | ICD-10-CM | POA: Diagnosis not present

## 2021-11-12 DIAGNOSIS — Z951 Presence of aortocoronary bypass graft: Principal | ICD-10-CM

## 2021-11-12 DIAGNOSIS — Z813 Family history of other psychoactive substance abuse and dependence: Secondary | ICD-10-CM

## 2021-11-12 DIAGNOSIS — J690 Pneumonitis due to inhalation of food and vomit: Secondary | ICD-10-CM | POA: Diagnosis not present

## 2021-11-12 DIAGNOSIS — E872 Acidosis, unspecified: Secondary | ICD-10-CM | POA: Diagnosis not present

## 2021-11-12 DIAGNOSIS — E878 Other disorders of electrolyte and fluid balance, not elsewhere classified: Secondary | ICD-10-CM | POA: Diagnosis not present

## 2021-11-12 DIAGNOSIS — K5900 Constipation, unspecified: Secondary | ICD-10-CM | POA: Diagnosis not present

## 2021-11-12 DIAGNOSIS — R0600 Dyspnea, unspecified: Secondary | ICD-10-CM | POA: Diagnosis not present

## 2021-11-12 DIAGNOSIS — J189 Pneumonia, unspecified organism: Secondary | ICD-10-CM | POA: Diagnosis not present

## 2021-11-12 DIAGNOSIS — K7689 Other specified diseases of liver: Secondary | ICD-10-CM | POA: Diagnosis not present

## 2021-11-12 DIAGNOSIS — Z6841 Body Mass Index (BMI) 40.0 and over, adult: Secondary | ICD-10-CM | POA: Diagnosis not present

## 2021-11-12 DIAGNOSIS — J988 Other specified respiratory disorders: Secondary | ICD-10-CM | POA: Diagnosis not present

## 2021-11-12 DIAGNOSIS — I4891 Unspecified atrial fibrillation: Secondary | ICD-10-CM | POA: Diagnosis not present

## 2021-11-12 DIAGNOSIS — Z992 Dependence on renal dialysis: Secondary | ICD-10-CM | POA: Diagnosis not present

## 2021-11-12 DIAGNOSIS — K9189 Other postprocedural complications and disorders of digestive system: Secondary | ICD-10-CM | POA: Diagnosis not present

## 2021-11-12 DIAGNOSIS — R918 Other nonspecific abnormal finding of lung field: Secondary | ICD-10-CM | POA: Diagnosis not present

## 2021-11-12 DIAGNOSIS — Z87891 Personal history of nicotine dependence: Secondary | ICD-10-CM

## 2021-11-12 DIAGNOSIS — I3139 Other pericardial effusion (noninflammatory): Secondary | ICD-10-CM | POA: Diagnosis not present

## 2021-11-12 DIAGNOSIS — Z4901 Encounter for fitting and adjustment of extracorporeal dialysis catheter: Secondary | ICD-10-CM | POA: Diagnosis not present

## 2021-11-12 HISTORY — PX: MAZE: SHX5063

## 2021-11-12 HISTORY — PX: TEE WITHOUT CARDIOVERSION: SHX5443

## 2021-11-12 HISTORY — PX: ENDOVEIN HARVEST OF GREATER SAPHENOUS VEIN: SHX5059

## 2021-11-12 HISTORY — DX: Presence of aortocoronary bypass graft: Z95.1

## 2021-11-12 HISTORY — PX: CORONARY ARTERY BYPASS GRAFT: SHX141

## 2021-11-12 LAB — CBC
HCT: 33.2 % — ABNORMAL LOW (ref 39.0–52.0)
HCT: 31.7 % — ABNORMAL LOW (ref 39.0–52.0)
Hemoglobin: 10.9 g/dL — ABNORMAL LOW (ref 13.0–17.0)
Hemoglobin: 10.6 g/dL — ABNORMAL LOW (ref 13.0–17.0)
MCH: 30.6 pg (ref 26.0–34.0)
MCH: 30.9 pg (ref 26.0–34.0)
MCHC: 32.8 g/dL (ref 30.0–36.0)
MCHC: 33.4 g/dL (ref 30.0–36.0)
MCV: 93.3 fL (ref 80.0–100.0)
MCV: 92.4 fL (ref 80.0–100.0)
Platelets: 185 10*3/uL (ref 150–400)
Platelets: 187 10*3/uL (ref 150–400)
RBC: 3.56 MIL/uL — ABNORMAL LOW (ref 4.22–5.81)
RBC: 3.43 MIL/uL — ABNORMAL LOW (ref 4.22–5.81)
RDW: 13.6 % (ref 11.5–15.5)
RDW: 13.7 % (ref 11.5–15.5)
WBC: 19.4 10*3/uL — ABNORMAL HIGH (ref 4.0–10.5)
WBC: 18.3 10*3/uL — ABNORMAL HIGH (ref 4.0–10.5)
nRBC: 0 % (ref 0.0–0.2)
nRBC: 0 % (ref 0.0–0.2)

## 2021-11-12 LAB — POCT I-STAT 7, (LYTES, BLD GAS, ICA,H+H)
Acid-Base Excess: 0 mmol/L (ref 0.0–2.0)
Acid-base deficit: 2 mmol/L (ref 0.0–2.0)
Acid-base deficit: 2 mmol/L (ref 0.0–2.0)
Acid-base deficit: 1 mmol/L (ref 0.0–2.0)
Acid-base deficit: 2 mmol/L (ref 0.0–2.0)
Bicarbonate: 25 mmol/L (ref 20.0–28.0)
Bicarbonate: 24.4 mmol/L (ref 20.0–28.0)
Bicarbonate: 24.6 mmol/L (ref 20.0–28.0)
Bicarbonate: 24.3 mmol/L (ref 20.0–28.0)
Bicarbonate: 24.2 mmol/L (ref 20.0–28.0)
Calcium, Ion: 1.21 mmol/L (ref 1.15–1.40)
Calcium, Ion: 1.01 mmol/L — ABNORMAL LOW (ref 1.15–1.40)
Calcium, Ion: 1 mmol/L — ABNORMAL LOW (ref 1.15–1.40)
Calcium, Ion: 1.01 mmol/L — ABNORMAL LOW (ref 1.15–1.40)
Calcium, Ion: 1.03 mmol/L — ABNORMAL LOW (ref 1.15–1.40)
HCT: 41 % (ref 39.0–52.0)
HCT: 28 % — ABNORMAL LOW (ref 39.0–52.0)
HCT: 26 % — ABNORMAL LOW (ref 39.0–52.0)
HCT: 27 % — ABNORMAL LOW (ref 39.0–52.0)
HCT: 31 % — ABNORMAL LOW (ref 39.0–52.0)
Hemoglobin: 13.9 g/dL (ref 13.0–17.0)
Hemoglobin: 9.5 g/dL — ABNORMAL LOW (ref 13.0–17.0)
Hemoglobin: 8.8 g/dL — ABNORMAL LOW (ref 13.0–17.0)
Hemoglobin: 9.2 g/dL — ABNORMAL LOW (ref 13.0–17.0)
Hemoglobin: 10.5 g/dL — ABNORMAL LOW (ref 13.0–17.0)
O2 Saturation: 100 %
O2 Saturation: 100 %
O2 Saturation: 100 %
O2 Saturation: 100 %
O2 Saturation: 100 %
Potassium: 4.3 mmol/L (ref 3.5–5.1)
Potassium: 4.7 mmol/L (ref 3.5–5.1)
Potassium: 4.5 mmol/L (ref 3.5–5.1)
Potassium: 4.9 mmol/L (ref 3.5–5.1)
Potassium: 4.8 mmol/L (ref 3.5–5.1)
Sodium: 136 mmol/L (ref 135–145)
Sodium: 138 mmol/L (ref 135–145)
Sodium: 137 mmol/L (ref 135–145)
Sodium: 136 mmol/L (ref 135–145)
Sodium: 137 mmol/L (ref 135–145)
TCO2: 27 mmol/L (ref 22–32)
TCO2: 26 mmol/L (ref 22–32)
TCO2: 26 mmol/L (ref 22–32)
TCO2: 26 mmol/L (ref 22–32)
TCO2: 26 mmol/L (ref 22–32)
pCO2 arterial: 49.8 mmHg — ABNORMAL HIGH (ref 32–48)
pCO2 arterial: 46.1 mmHg (ref 32–48)
pCO2 arterial: 38.8 mmHg (ref 32–48)
pCO2 arterial: 41.6 mmHg (ref 32–48)
pCO2 arterial: 46.6 mmHg (ref 32–48)
pH, Arterial: 7.31 — ABNORMAL LOW (ref 7.35–7.45)
pH, Arterial: 7.332 — ABNORMAL LOW (ref 7.35–7.45)
pH, Arterial: 7.411 (ref 7.35–7.45)
pH, Arterial: 7.375 (ref 7.35–7.45)
pH, Arterial: 7.323 — ABNORMAL LOW (ref 7.35–7.45)
pO2, Arterial: 348 mmHg — ABNORMAL HIGH (ref 83–108)
pO2, Arterial: 275 mmHg — ABNORMAL HIGH (ref 83–108)
pO2, Arterial: 275 mmHg — ABNORMAL HIGH (ref 83–108)
pO2, Arterial: 300 mmHg — ABNORMAL HIGH (ref 83–108)
pO2, Arterial: 355 mmHg — ABNORMAL HIGH (ref 83–108)

## 2021-11-12 LAB — BASIC METABOLIC PANEL
Anion gap: 7 (ref 5–15)
BUN: 24 mg/dL — ABNORMAL HIGH (ref 8–23)
CO2: 21 mmol/L — ABNORMAL LOW (ref 22–32)
Calcium: 7 mg/dL — ABNORMAL LOW (ref 8.9–10.3)
Chloride: 110 mmol/L (ref 98–111)
Creatinine, Ser: 1.45 mg/dL — ABNORMAL HIGH (ref 0.61–1.24)
GFR, Estimated: 53 mL/min — ABNORMAL LOW (ref >60)
Glucose, Bld: 158 mg/dL — ABNORMAL HIGH (ref 70–99)
Potassium: 4.2 mmol/L (ref 3.5–5.1)
Sodium: 138 mmol/L (ref 135–145)

## 2021-11-12 LAB — POCT I-STAT, CHEM 8
BUN: 33 mg/dL — ABNORMAL HIGH (ref 8–23)
BUN: 28 mg/dL — ABNORMAL HIGH (ref 8–23)
BUN: 27 mg/dL — ABNORMAL HIGH (ref 8–23)
BUN: 26 mg/dL — ABNORMAL HIGH (ref 8–23)
BUN: 25 mg/dL — ABNORMAL HIGH (ref 8–23)
BUN: 26 mg/dL — ABNORMAL HIGH (ref 8–23)
Calcium, Ion: 1.21 mmol/L (ref 1.15–1.40)
Calcium, Ion: 1.17 mmol/L (ref 1.15–1.40)
Calcium, Ion: 1.17 mmol/L (ref 1.15–1.40)
Calcium, Ion: 1.01 mmol/L — ABNORMAL LOW (ref 1.15–1.40)
Calcium, Ion: 1 mmol/L — ABNORMAL LOW (ref 1.15–1.40)
Calcium, Ion: 1.02 mmol/L — ABNORMAL LOW (ref 1.15–1.40)
Chloride: 102 mmol/L (ref 98–111)
Chloride: 103 mmol/L (ref 98–111)
Chloride: 104 mmol/L (ref 98–111)
Chloride: 101 mmol/L (ref 98–111)
Chloride: 102 mmol/L (ref 98–111)
Chloride: 102 mmol/L (ref 98–111)
Creatinine, Ser: 1.5 mg/dL — ABNORMAL HIGH (ref 0.61–1.24)
Creatinine, Ser: 1.4 mg/dL — ABNORMAL HIGH (ref 0.61–1.24)
Creatinine, Ser: 1.4 mg/dL — ABNORMAL HIGH (ref 0.61–1.24)
Creatinine, Ser: 1.4 mg/dL — ABNORMAL HIGH (ref 0.61–1.24)
Creatinine, Ser: 1.3 mg/dL — ABNORMAL HIGH (ref 0.61–1.24)
Creatinine, Ser: 1.3 mg/dL — ABNORMAL HIGH (ref 0.61–1.24)
Glucose, Bld: 251 mg/dL — ABNORMAL HIGH (ref 70–99)
Glucose, Bld: 179 mg/dL — ABNORMAL HIGH (ref 70–99)
Glucose, Bld: 166 mg/dL — ABNORMAL HIGH (ref 70–99)
Glucose, Bld: 142 mg/dL — ABNORMAL HIGH (ref 70–99)
Glucose, Bld: 163 mg/dL — ABNORMAL HIGH (ref 70–99)
Glucose, Bld: 168 mg/dL — ABNORMAL HIGH (ref 70–99)
HCT: 39 % (ref 39.0–52.0)
HCT: 35 % — ABNORMAL LOW (ref 39.0–52.0)
HCT: 34 % — ABNORMAL LOW (ref 39.0–52.0)
HCT: 26 % — ABNORMAL LOW (ref 39.0–52.0)
HCT: 26 % — ABNORMAL LOW (ref 39.0–52.0)
HCT: 29 % — ABNORMAL LOW (ref 39.0–52.0)
Hemoglobin: 13.3 g/dL (ref 13.0–17.0)
Hemoglobin: 11.9 g/dL — ABNORMAL LOW (ref 13.0–17.0)
Hemoglobin: 11.6 g/dL — ABNORMAL LOW (ref 13.0–17.0)
Hemoglobin: 8.8 g/dL — ABNORMAL LOW (ref 13.0–17.0)
Hemoglobin: 8.8 g/dL — ABNORMAL LOW (ref 13.0–17.0)
Hemoglobin: 9.9 g/dL — ABNORMAL LOW (ref 13.0–17.0)
Potassium: 4.4 mmol/L (ref 3.5–5.1)
Potassium: 4.2 mmol/L (ref 3.5–5.1)
Potassium: 4.3 mmol/L (ref 3.5–5.1)
Potassium: 4.4 mmol/L (ref 3.5–5.1)
Potassium: 5.3 mmol/L — ABNORMAL HIGH (ref 3.5–5.1)
Potassium: 4.8 mmol/L (ref 3.5–5.1)
Sodium: 136 mmol/L (ref 135–145)
Sodium: 137 mmol/L (ref 135–145)
Sodium: 137 mmol/L (ref 135–145)
Sodium: 137 mmol/L (ref 135–145)
Sodium: 136 mmol/L (ref 135–145)
Sodium: 137 mmol/L (ref 135–145)
TCO2: 26 mmol/L (ref 22–32)
TCO2: 24 mmol/L (ref 22–32)
TCO2: 24 mmol/L (ref 22–32)
TCO2: 25 mmol/L (ref 22–32)
TCO2: 25 mmol/L (ref 22–32)
TCO2: 25 mmol/L (ref 22–32)

## 2021-11-12 LAB — POCT I-STAT EG7
Acid-base deficit: 2 mmol/L (ref 0.0–2.0)
Bicarbonate: 24.8 mmol/L (ref 20.0–28.0)
Calcium, Ion: 1.06 mmol/L — ABNORMAL LOW (ref 1.15–1.40)
HCT: 28 % — ABNORMAL LOW (ref 39.0–52.0)
Hemoglobin: 9.5 g/dL — ABNORMAL LOW (ref 13.0–17.0)
O2 Saturation: 76 %
Potassium: 4.4 mmol/L (ref 3.5–5.1)
Sodium: 138 mmol/L (ref 135–145)
TCO2: 26 mmol/L (ref 22–32)
pCO2, Ven: 48.9 mmHg (ref 44–60)
pH, Ven: 7.314 (ref 7.25–7.43)
pO2, Ven: 45 mmHg (ref 32–45)

## 2021-11-12 LAB — PREPARE RBC (CROSSMATCH)

## 2021-11-12 LAB — GLUCOSE, CAPILLARY
Glucose-Capillary: 293 mg/dL — ABNORMAL HIGH (ref 70–99)
Glucose-Capillary: 140 mg/dL — ABNORMAL HIGH (ref 70–99)
Glucose-Capillary: 144 mg/dL — ABNORMAL HIGH (ref 70–99)
Glucose-Capillary: 179 mg/dL — ABNORMAL HIGH (ref 70–99)
Glucose-Capillary: 140 mg/dL — ABNORMAL HIGH (ref 70–99)
Glucose-Capillary: 151 mg/dL — ABNORMAL HIGH (ref 70–99)
Glucose-Capillary: 151 mg/dL — ABNORMAL HIGH (ref 70–99)
Glucose-Capillary: 192 mg/dL — ABNORMAL HIGH (ref 70–99)

## 2021-11-12 LAB — APTT: aPTT: 27 seconds (ref 24–36)

## 2021-11-12 LAB — PLATELET COUNT: Platelets: 154 10*3/uL (ref 150–400)

## 2021-11-12 LAB — MAGNESIUM: Magnesium: 2.7 mg/dL — ABNORMAL HIGH (ref 1.7–2.4)

## 2021-11-12 LAB — POCT ACTIVATED CLOTTING TIME: Activated Clotting Time: 119 seconds

## 2021-11-12 LAB — PROTIME-INR
INR: 1.4 — ABNORMAL HIGH (ref 0.8–1.2)
Prothrombin Time: 17.3 seconds — ABNORMAL HIGH (ref 11.4–15.2)

## 2021-11-12 LAB — HEMOGLOBIN AND HEMATOCRIT, BLOOD
HCT: 26.2 % — ABNORMAL LOW (ref 39.0–52.0)
Hemoglobin: 9.1 g/dL — ABNORMAL LOW (ref 13.0–17.0)

## 2021-11-12 LAB — ABO/RH: ABO/RH(D): A POS

## 2021-11-12 SURGERY — CORONARY ARTERY BYPASS GRAFTING (CABG)
Anesthesia: General | Site: Chest

## 2021-11-12 MED ORDER — PHENYLEPHRINE 80 MCG/ML (10ML) SYRINGE FOR IV PUSH (FOR BLOOD PRESSURE SUPPORT)
PREFILLED_SYRINGE | INTRAVENOUS | Status: AC
  Filled 2021-11-12: qty 10

## 2021-11-12 MED ORDER — ROCURONIUM BROMIDE 10 MG/ML (PF) SYRINGE
PREFILLED_SYRINGE | INTRAVENOUS | Status: DC | PRN
  Administered 2021-11-12: 60 mg via INTRAVENOUS
  Administered 2021-11-12: 30 mg via INTRAVENOUS
  Administered 2021-11-12: 70 mg via INTRAVENOUS
  Administered 2021-11-12: 20 mg via INTRAVENOUS
  Administered 2021-11-12: 40 mg via INTRAVENOUS

## 2021-11-12 MED ORDER — BISACODYL 5 MG PO TBEC
10.0000 mg | DELAYED_RELEASE_TABLET | Freq: Every day | ORAL | Status: DC
  Administered 2021-11-14 – 2021-11-15 (×2): 10 mg via ORAL
  Filled 2021-11-12 (×3): qty 2

## 2021-11-12 MED ORDER — LACTATED RINGERS IV SOLN: INTRAVENOUS | Status: DC | PRN

## 2021-11-12 MED ORDER — FENTANYL CITRATE (PF) 250 MCG/5ML IJ SOLN
INTRAMUSCULAR | Status: AC
  Filled 2021-11-12: qty 5

## 2021-11-12 MED ORDER — PROTAMINE SULFATE 10 MG/ML IV SOLN
INTRAVENOUS | Status: AC
  Filled 2021-11-12: qty 25

## 2021-11-12 MED ORDER — DEXAMETHASONE SODIUM PHOSPHATE 10 MG/ML IJ SOLN
INTRAMUSCULAR | Status: DC | PRN
  Administered 2021-11-12: 5 mg via INTRAVENOUS

## 2021-11-12 MED ORDER — FLUTICASONE FUROATE-VILANTEROL 200-25 MCG/ACT IN AEPB
1.0000 | INHALATION_SPRAY | Freq: Every day | RESPIRATORY_TRACT | Status: DC
  Administered 2021-11-13 – 2021-11-22 (×10): 1 via RESPIRATORY_TRACT
  Filled 2021-11-12: qty 28

## 2021-11-12 MED ORDER — DEXTROSE 50 % IV SOLN: 0.0000 mL | INTRAVENOUS | Status: DC | PRN

## 2021-11-12 MED ORDER — VANCOMYCIN HCL IN DEXTROSE 1-5 GM/200ML-% IV SOLN
1000.0000 mg | Freq: Once | INTRAVENOUS | Status: AC
  Administered 2021-11-12: 1000 mg via INTRAVENOUS
  Filled 2021-11-12: qty 200

## 2021-11-12 MED ORDER — FAMOTIDINE IN NACL 20-0.9 MG/50ML-% IV SOLN
20.0000 mg | Freq: Two times a day (BID) | INTRAVENOUS | Status: DC
  Administered 2021-11-12: 20 mg via INTRAVENOUS
  Filled 2021-11-12: qty 50

## 2021-11-12 MED ORDER — METOPROLOL TARTRATE 12.5 MG HALF TABLET: 12.5000 mg | ORAL_TABLET | Freq: Once | ORAL | Status: DC

## 2021-11-12 MED ORDER — HEMOSTATIC AGENTS (NO CHARGE) OPTIME
TOPICAL | Status: DC | PRN
  Administered 2021-11-12: 1 via TOPICAL

## 2021-11-12 MED ORDER — MILRINONE LACTATE IN DEXTROSE 20-5 MG/100ML-% IV SOLN
0.1250 ug/kg/min | INTRAVENOUS | Status: DC
  Administered 2021-11-12 – 2021-11-17 (×9): 0.25 ug/kg/min via INTRAVENOUS
  Administered 2021-11-17: 0.125 ug/kg/min via INTRAVENOUS
  Filled 2021-11-12 (×12): qty 100

## 2021-11-12 MED ORDER — SODIUM CHLORIDE 0.9 % IV SOLN: INTRAVENOUS | Status: DC

## 2021-11-12 MED ORDER — AMIODARONE HCL IN DEXTROSE 360-4.14 MG/200ML-% IV SOLN
30.0000 mg/h | INTRAVENOUS | Status: DC
  Filled 2021-11-12: qty 200

## 2021-11-12 MED ORDER — CHLORHEXIDINE GLUCONATE 0.12 % MT SOLN
15.0000 mL | Freq: Once | OROMUCOSAL | Status: AC
  Administered 2021-11-12: 15 mL via OROMUCOSAL
  Filled 2021-11-12: qty 15

## 2021-11-12 MED ORDER — ACETAMINOPHEN 160 MG/5ML PO SOLN: 650.0000 mg | Freq: Once | ORAL | Status: DC

## 2021-11-12 MED ORDER — OXYCODONE HCL 5 MG PO TABS
5.0000 mg | ORAL_TABLET | ORAL | Status: DC | PRN
  Administered 2021-11-13 (×3): 10 mg via ORAL
  Filled 2021-11-12 (×3): qty 2

## 2021-11-12 MED ORDER — MIDAZOLAM HCL 2 MG/2ML IJ SOLN: INTRAMUSCULAR | Status: DC | PRN

## 2021-11-12 MED ORDER — PROPOFOL 10 MG/ML IV BOLUS
INTRAVENOUS | Status: AC
  Filled 2021-11-12: qty 20

## 2021-11-12 MED ORDER — DOCUSATE SODIUM 100 MG PO CAPS
200.0000 mg | ORAL_CAPSULE | Freq: Every day | ORAL | Status: DC
  Administered 2021-11-13 – 2021-12-01 (×17): 200 mg via ORAL
  Filled 2021-11-12 (×19): qty 2

## 2021-11-12 MED ORDER — 0.9 % SODIUM CHLORIDE (POUR BTL) OPTIME
TOPICAL | Status: DC | PRN
  Administered 2021-11-12: 1000 mL
  Administered 2021-11-12: 5000 mL

## 2021-11-12 MED ORDER — ASPIRIN 81 MG PO CHEW
324.0000 mg | CHEWABLE_TABLET | Freq: Every day | ORAL | Status: DC
  Filled 2021-11-12 (×2): qty 4

## 2021-11-12 MED ORDER — LACTATED RINGERS IV SOLN
INTRAVENOUS | Status: DC | PRN
Start: 2021-11-12 — End: 2021-11-12

## 2021-11-12 MED ORDER — ALBUMIN HUMAN 5 % IV SOLN
250.0000 mL | INTRAVENOUS | Status: AC | PRN
  Administered 2021-11-12 (×2): 12.5 g via INTRAVENOUS
  Filled 2021-11-12: qty 250

## 2021-11-12 MED ORDER — ORAL CARE MOUTH RINSE: 15.0000 mL | Freq: Once | OROMUCOSAL | Status: AC

## 2021-11-12 MED ORDER — PROTAMINE SULFATE 10 MG/ML IV SOLN
INTRAVENOUS | Status: DC | PRN
  Administered 2021-11-12: 400 mg via INTRAVENOUS
  Administered 2021-11-12: 50 mg via INTRAVENOUS

## 2021-11-12 MED ORDER — METOPROLOL TARTRATE 25 MG/10 ML ORAL SUSPENSION: 12.5000 mg | Freq: Two times a day (BID) | ORAL | Status: DC

## 2021-11-12 MED ORDER — LEVALBUTEROL HCL 1.25 MG/0.5ML IN NEBU: 1.2500 mg | INHALATION_SOLUTION | Freq: Four times a day (QID) | RESPIRATORY_TRACT | Status: DC

## 2021-11-12 MED ORDER — BISACODYL 10 MG RE SUPP
10.0000 mg | Freq: Every day | RECTAL | Status: DC
  Administered 2021-11-13: 10 mg via RECTAL
  Filled 2021-11-12: qty 1

## 2021-11-12 MED ORDER — ACETAMINOPHEN 160 MG/5ML PO SOLN
1000.0000 mg | Freq: Four times a day (QID) | ORAL | Status: AC
  Administered 2021-11-14 (×2): 1000 mg
  Filled 2021-11-12 (×2): qty 40.6

## 2021-11-12 MED ORDER — PLASMA-LYTE A IV SOLN
INTRAVENOUS | Status: DC | PRN
  Administered 2021-11-12: 500 mL via INTRAVASCULAR

## 2021-11-12 MED ORDER — CHLORHEXIDINE GLUCONATE CLOTH 2 % EX PADS
6.0000 | MEDICATED_PAD | Freq: Every day | CUTANEOUS | Status: DC
  Administered 2021-11-12 – 2021-12-03 (×23): 6 via TOPICAL

## 2021-11-12 MED ORDER — CEFAZOLIN SODIUM 1 G IJ SOLR
INTRAMUSCULAR | Status: AC
  Filled 2021-11-12: qty 10

## 2021-11-12 MED ORDER — PANTOPRAZOLE SODIUM 40 MG PO TBEC
40.0000 mg | DELAYED_RELEASE_TABLET | Freq: Every day | ORAL | Status: DC
  Administered 2021-11-14 – 2021-12-04 (×21): 40 mg via ORAL
  Filled 2021-11-12 (×21): qty 1

## 2021-11-12 MED ORDER — ~~LOC~~ CARDIAC SURGERY, PATIENT & FAMILY EDUCATION
Freq: Once | Status: DC
  Filled 2021-11-12: qty 1

## 2021-11-12 MED ORDER — INSULIN REGULAR(HUMAN) IN NACL 100-0.9 UT/100ML-% IV SOLN
INTRAVENOUS | Status: DC
  Administered 2021-11-13: 7.5 [IU]/h via INTRAVENOUS
  Filled 2021-11-12: qty 100

## 2021-11-12 MED ORDER — ONDANSETRON HCL 4 MG/2ML IJ SOLN
4.0000 mg | Freq: Four times a day (QID) | INTRAMUSCULAR | Status: DC | PRN
  Administered 2021-11-13 – 2021-11-27 (×4): 4 mg via INTRAVENOUS
  Filled 2021-11-12 (×5): qty 2

## 2021-11-12 MED ORDER — MORPHINE SULFATE (PF) 2 MG/ML IV SOLN
1.0000 mg | INTRAVENOUS | Status: DC | PRN
  Administered 2021-11-12: 2 mg via INTRAVENOUS
  Administered 2021-11-12: 4 mg via INTRAVENOUS
  Administered 2021-11-12: 2 mg via INTRAVENOUS
  Administered 2021-11-13: 4 mg via INTRAVENOUS
  Filled 2021-11-12: qty 1
  Filled 2021-11-12: qty 2
  Filled 2021-11-12: qty 1
  Filled 2021-11-12: qty 2

## 2021-11-12 MED ORDER — MAGNESIUM SULFATE 4 GM/100ML IV SOLN
4.0000 g | Freq: Once | INTRAVENOUS | Status: AC
  Administered 2021-11-12: 4 g via INTRAVENOUS
  Filled 2021-11-12: qty 100

## 2021-11-12 MED ORDER — PHENYLEPHRINE 80 MCG/ML (10ML) SYRINGE FOR IV PUSH (FOR BLOOD PRESSURE SUPPORT)
PREFILLED_SYRINGE | INTRAVENOUS | Status: DC | PRN
  Administered 2021-11-12: 40 ug via INTRAVENOUS
  Administered 2021-11-12 (×3): 80 ug via INTRAVENOUS
  Administered 2021-11-12: 120 ug via INTRAVENOUS

## 2021-11-12 MED ORDER — SODIUM CHLORIDE 0.9 % IV SOLN: INTRAVENOUS | Status: DC | PRN

## 2021-11-12 MED ORDER — ONDANSETRON HCL 4 MG/2ML IJ SOLN
INTRAMUSCULAR | Status: DC | PRN
  Administered 2021-11-12: 4 mg via INTRAVENOUS

## 2021-11-12 MED ORDER — SODIUM CHLORIDE (PF) 0.9 % IJ SOLN
OROMUCOSAL | Status: DC | PRN
  Administered 2021-11-12: 12 mL via TOPICAL

## 2021-11-12 MED ORDER — ACETAMINOPHEN 650 MG RE SUPP: 650.0000 mg | Freq: Once | RECTAL | Status: DC

## 2021-11-12 MED ORDER — LACTATED RINGERS IV SOLN: INTRAVENOUS | Status: DC

## 2021-11-12 MED ORDER — AMIODARONE HCL IN DEXTROSE 360-4.14 MG/200ML-% IV SOLN
60.0000 mg/h | INTRAVENOUS | Status: AC
  Administered 2021-11-12: 60 mg/h via INTRAVENOUS
  Filled 2021-11-12 (×2): qty 200

## 2021-11-12 MED ORDER — SODIUM CHLORIDE 0.9% FLUSH: 3.0000 mL | INTRAVENOUS | Status: DC | PRN

## 2021-11-12 MED ORDER — METOPROLOL TARTRATE 12.5 MG HALF TABLET: 12.5000 mg | ORAL_TABLET | Freq: Two times a day (BID) | ORAL | Status: DC

## 2021-11-12 MED ORDER — AMIODARONE HCL IN DEXTROSE 360-4.14 MG/200ML-% IV SOLN
30.0000 mg/h | INTRAVENOUS | Status: AC
  Administered 2021-11-12: 30 mg/h via INTRAVENOUS

## 2021-11-12 MED ORDER — LEVALBUTEROL HCL 1.25 MG/0.5ML IN NEBU
1.2500 mg | INHALATION_SOLUTION | Freq: Four times a day (QID) | RESPIRATORY_TRACT | Status: DC
  Administered 2021-11-12 – 2021-11-13 (×5): 1.25 mg via RESPIRATORY_TRACT
  Filled 2021-11-12 (×5): qty 0.5

## 2021-11-12 MED ORDER — HEPARIN SODIUM (PORCINE) 1000 UNIT/ML IJ SOLN
INTRAMUSCULAR | Status: AC
  Filled 2021-11-12: qty 1

## 2021-11-12 MED ORDER — ALBUMIN HUMAN 5 % IV SOLN: INTRAVENOUS | Status: DC | PRN

## 2021-11-12 MED ORDER — MIDAZOLAM HCL (PF) 10 MG/2ML IJ SOLN
INTRAMUSCULAR | Status: AC
  Filled 2021-11-12: qty 2

## 2021-11-12 MED ORDER — CHLORHEXIDINE GLUCONATE 0.12 % MT SOLN
15.0000 mL | OROMUCOSAL | Status: AC
  Administered 2021-11-12: 15 mL via OROMUCOSAL

## 2021-11-12 MED ORDER — PROPOFOL 10 MG/ML IV BOLUS
INTRAVENOUS | Status: DC | PRN
  Administered 2021-11-12: 150 mg via INTRAVENOUS

## 2021-11-12 MED ORDER — ASPIRIN 325 MG PO TBEC
325.0000 mg | DELAYED_RELEASE_TABLET | Freq: Every day | ORAL | Status: DC
  Administered 2021-11-13 – 2021-11-29 (×17): 325 mg via ORAL
  Filled 2021-11-12 (×17): qty 1

## 2021-11-12 MED ORDER — ACETAMINOPHEN 500 MG PO TABS
1000.0000 mg | ORAL_TABLET | Freq: Four times a day (QID) | ORAL | Status: AC
  Administered 2021-11-13 – 2021-11-17 (×14): 1000 mg via ORAL
  Filled 2021-11-12 (×13): qty 2

## 2021-11-12 MED ORDER — PHENYLEPHRINE HCL-NACL 20-0.9 MG/250ML-% IV SOLN
0.0000 ug/min | INTRAVENOUS | Status: DC
  Administered 2021-11-12: 35 ug/min via INTRAVENOUS
  Administered 2021-11-13: 50 ug/min via INTRAVENOUS
  Filled 2021-11-12 (×3): qty 250

## 2021-11-12 MED ORDER — ROCURONIUM BROMIDE 10 MG/ML (PF) SYRINGE
PREFILLED_SYRINGE | INTRAVENOUS | Status: AC
  Filled 2021-11-12: qty 10

## 2021-11-12 MED ORDER — CEFAZOLIN SODIUM-DEXTROSE 2-4 GM/100ML-% IV SOLN
2.0000 g | Freq: Three times a day (TID) | INTRAVENOUS | Status: AC
  Administered 2021-11-12 – 2021-11-14 (×6): 2 g via INTRAVENOUS
  Filled 2021-11-12 (×6): qty 100

## 2021-11-12 MED ORDER — SODIUM CHLORIDE 0.9% FLUSH
3.0000 mL | Freq: Two times a day (BID) | INTRAVENOUS | Status: DC
  Administered 2021-11-13 – 2021-11-29 (×26): 3 mL via INTRAVENOUS

## 2021-11-12 MED ORDER — PROTAMINE SULFATE 10 MG/ML IV SOLN
INTRAVENOUS | Status: AC
  Filled 2021-11-12: qty 15

## 2021-11-12 MED ORDER — ROSUVASTATIN CALCIUM 20 MG PO TABS
20.0000 mg | ORAL_TABLET | Freq: Every day | ORAL | Status: DC
  Administered 2021-11-13 – 2021-11-28 (×16): 20 mg via ORAL
  Filled 2021-11-12 (×16): qty 1

## 2021-11-12 MED ORDER — HEPARIN SODIUM (PORCINE) 1000 UNIT/ML IJ SOLN
INTRAMUSCULAR | Status: AC
  Filled 2021-11-12: qty 10

## 2021-11-12 MED ORDER — LACTATED RINGERS IV SOLN: 500.0000 mL | Freq: Once | INTRAVENOUS | Status: DC | PRN

## 2021-11-12 MED ORDER — SODIUM CHLORIDE 0.45 % IV SOLN: INTRAVENOUS | Status: DC | PRN

## 2021-11-12 MED ORDER — SODIUM CHLORIDE 0.9 % IV SOLN: 250.0000 mL | INTRAVENOUS | Status: DC

## 2021-11-12 MED ORDER — METOPROLOL TARTRATE 5 MG/5ML IV SOLN: 2.5000 mg | INTRAVENOUS | Status: DC | PRN

## 2021-11-12 MED ORDER — ONDANSETRON HCL 4 MG/2ML IJ SOLN
INTRAMUSCULAR | Status: AC
  Filled 2021-11-12: qty 2

## 2021-11-12 MED ORDER — NITROGLYCERIN IN D5W 200-5 MCG/ML-% IV SOLN: 0.0000 ug/min | INTRAVENOUS | Status: DC

## 2021-11-12 MED ORDER — DEXMEDETOMIDINE HCL IN NACL 400 MCG/100ML IV SOLN
0.0000 ug/kg/h | INTRAVENOUS | Status: DC
  Administered 2021-11-12: 0.7 ug/kg/h via INTRAVENOUS

## 2021-11-12 MED ORDER — SURGIFLO WITH THROMBIN (HEMOSTATIC MATRIX KIT) OPTIME
TOPICAL | Status: DC | PRN
  Administered 2021-11-12: 1 via TOPICAL

## 2021-11-12 MED ORDER — SODIUM CHLORIDE 0.9 % IV SOLN
20.0000 ug | Freq: Once | INTRAVENOUS | Status: AC
  Administered 2021-11-12: 20 ug via INTRAVENOUS
  Filled 2021-11-12: qty 5

## 2021-11-12 MED ORDER — MIDAZOLAM HCL (PF) 5 MG/ML IJ SOLN
INTRAMUSCULAR | Status: DC | PRN
  Administered 2021-11-12: 3 mg via INTRAVENOUS
  Administered 2021-11-12 (×2): 2 mg via INTRAVENOUS
  Administered 2021-11-12: 1 mg via INTRAVENOUS
  Administered 2021-11-12: 2 mg via INTRAVENOUS

## 2021-11-12 MED ORDER — CHLORHEXIDINE GLUCONATE 0.12 % MT SOLN: 15.0000 mL | Freq: Once | OROMUCOSAL | Status: DC

## 2021-11-12 MED ORDER — DEXAMETHASONE SODIUM PHOSPHATE 10 MG/ML IJ SOLN
INTRAMUSCULAR | Status: AC
  Filled 2021-11-12: qty 1

## 2021-11-12 MED ORDER — TRAMADOL HCL 50 MG PO TABS: 50.0000 mg | ORAL_TABLET | ORAL | Status: DC | PRN

## 2021-11-12 MED ORDER — MIDAZOLAM HCL 2 MG/2ML IJ SOLN
2.0000 mg | INTRAMUSCULAR | Status: DC | PRN
  Administered 2021-11-12: 2 mg via INTRAVENOUS
  Filled 2021-11-12: qty 2

## 2021-11-12 MED ORDER — FENTANYL CITRATE (PF) 250 MCG/5ML IJ SOLN
INTRAMUSCULAR | Status: DC | PRN
  Administered 2021-11-12: 100 ug via INTRAVENOUS
  Administered 2021-11-12 (×2): 50 ug via INTRAVENOUS
  Administered 2021-11-12: 100 ug via INTRAVENOUS
  Administered 2021-11-12 (×3): 150 ug via INTRAVENOUS

## 2021-11-12 MED ORDER — HEPARIN SODIUM (PORCINE) 1000 UNIT/ML IJ SOLN
INTRAMUSCULAR | Status: DC | PRN
  Administered 2021-11-12: 4000 [IU] via INTRAVENOUS
  Administered 2021-11-12: 37000 [IU] via INTRAVENOUS

## 2021-11-12 MED ORDER — MUPIROCIN CALCIUM 2 % EX CREA
TOPICAL_CREAM | Freq: Two times a day (BID) | CUTANEOUS | Status: AC
  Administered 2021-11-16: 1 via TOPICAL
  Filled 2021-11-12: qty 15

## 2021-11-12 MED ORDER — CHLORHEXIDINE GLUCONATE 4 % EX LIQD: 30.0000 mL | CUTANEOUS | Status: DC

## 2021-11-12 MED ORDER — POTASSIUM CHLORIDE 10 MEQ/50ML IV SOLN: 10.0000 meq | INTRAVENOUS | Status: DC

## 2021-11-12 SURGICAL SUPPLY — 103 items
ADAPTER CARDIO PERF ANTE/RETRO (ADAPTER) ×4 IMPLANT
BAG DECANTER FOR FLEXI CONT (MISCELLANEOUS) ×4 IMPLANT
BLADE CLIPPER SURG (BLADE) ×4 IMPLANT
BLADE NDL 3 SS STRL (BLADE) IMPLANT
BLADE NEEDLE 3 SS STRL (BLADE) ×4 IMPLANT
BLADE STERNUM SYSTEM 6 (BLADE) ×4 IMPLANT
BLADE SURG 12 STRL SS (BLADE) ×4 IMPLANT
BNDG ELASTIC 4X5.8 VLCR STR LF (GAUZE/BANDAGES/DRESSINGS) ×4 IMPLANT
BNDG ELASTIC 6X5.8 VLCR STR LF (GAUZE/BANDAGES/DRESSINGS) ×4 IMPLANT
BNDG GAUZE ELAST 4 BULKY (GAUZE/BANDAGES/DRESSINGS) ×4 IMPLANT
CANISTER SUCT 3000ML PPV (MISCELLANEOUS) ×4 IMPLANT
CANNULA GUNDRY RCSP 15FR (MISCELLANEOUS) ×4 IMPLANT
CARDIOBLATE CARDIAC ABLATION (MISCELLANEOUS)
CATH CPB KIT VANTRIGT (MISCELLANEOUS) ×4 IMPLANT
CATH ROBINSON RED A/P 18FR (CATHETERS) ×14 IMPLANT
CATH THORACIC 28FR RT ANG (CATHETERS) ×4 IMPLANT
CLAMP ISOLATOR SYNERGY LG (MISCELLANEOUS) ×1 IMPLANT
CONTAINER PROTECT SURGISLUSH (MISCELLANEOUS) ×8 IMPLANT
DERMABOND ADVANCED (GAUZE/BANDAGES/DRESSINGS) ×1
DERMABOND ADVANCED .7 DNX12 (GAUZE/BANDAGES/DRESSINGS) IMPLANT
DEVICE CARDIOBLATE CARDIAC ABL (MISCELLANEOUS) IMPLANT
DRAIN CHANNEL 32F RND 10.7 FF (WOUND CARE) ×4 IMPLANT
DRAPE CARDIOVASCULAR INCISE (DRAPES) ×1
DRAPE SRG 135X102X78XABS (DRAPES) ×3 IMPLANT
DRAPE WARM FLUID 44X44 (DRAPES) ×1 IMPLANT
DRSG AQUACEL AG ADV 3.5X14 (GAUZE/BANDAGES/DRESSINGS) ×4 IMPLANT
ELECT BLADE 4.0 EZ CLEAN MEGAD (MISCELLANEOUS) ×4
ELECT BLADE 6.5 EXT (BLADE) ×4 IMPLANT
ELECT CAUTERY BLADE 6.4 (BLADE) ×4 IMPLANT
ELECT REM PT RETURN 9FT ADLT (ELECTROSURGICAL) ×8
ELECTRODE BLDE 4.0 EZ CLN MEGD (MISCELLANEOUS) ×3 IMPLANT
ELECTRODE REM PT RTRN 9FT ADLT (ELECTROSURGICAL) ×6 IMPLANT
FELT TEFLON 1X6 (MISCELLANEOUS) ×7 IMPLANT
GAUZE 4X4 16PLY ~~LOC~~+RFID DBL (SPONGE) ×4 IMPLANT
GAUZE SPONGE 4X4 12PLY STRL (GAUZE/BANDAGES/DRESSINGS) ×8 IMPLANT
GLOVE BIO SURGEON STRL SZ7.5 (GLOVE) ×12 IMPLANT
GOWN STRL REUS W/ TWL LRG LVL3 (GOWN DISPOSABLE) ×12 IMPLANT
GOWN STRL REUS W/TWL LRG LVL3 (GOWN DISPOSABLE) ×4
HEMOSTAT POWDER SURGIFOAM 1G (HEMOSTASIS) ×12 IMPLANT
HEMOSTAT SURGICEL 2X14 (HEMOSTASIS) ×4 IMPLANT
INSERT FOGARTY XLG (MISCELLANEOUS) ×1 IMPLANT
KIT BASIN OR (CUSTOM PROCEDURE TRAY) ×4 IMPLANT
KIT SUCTION CATH 14FR (SUCTIONS) ×4 IMPLANT
KIT TURNOVER KIT B (KITS) ×4 IMPLANT
KIT VASOVIEW HEMOPRO 2 VH 4000 (KITS) ×4 IMPLANT
LEAD PACING MYOCARDI (MISCELLANEOUS) ×4 IMPLANT
LOOP VESSEL SUPERMAXI WHITE (MISCELLANEOUS) ×1 IMPLANT
MARKER GRAFT CORONARY BYPASS (MISCELLANEOUS) ×12 IMPLANT
NS IRRIG 1000ML POUR BTL (IV SOLUTION) ×20 IMPLANT
PACK E OPEN HEART (SUTURE) ×4 IMPLANT
PACK OPEN HEART (CUSTOM PROCEDURE TRAY) ×4 IMPLANT
PAD ARMBOARD 7.5X6 YLW CONV (MISCELLANEOUS) ×8 IMPLANT
PAD ELECT DEFIB RADIOL ZOLL (MISCELLANEOUS) ×4 IMPLANT
PENCIL BUTTON HOLSTER BLD 10FT (ELECTRODE) ×4 IMPLANT
POSITIONER HEAD DONUT 9IN (MISCELLANEOUS) ×4 IMPLANT
POWDER SURGICEL 3.0 GRAM (HEMOSTASIS) ×4 IMPLANT
PROBE CRYO2-ABLATION MALLABLE (MISCELLANEOUS) IMPLANT
PUNCH AORTIC ROTATE 4.0MM (MISCELLANEOUS) IMPLANT
PUNCH AORTIC ROTATE 4.5MM 8IN (MISCELLANEOUS) ×1 IMPLANT
PUNCH AORTIC ROTATE 5MM 8IN (MISCELLANEOUS) IMPLANT
SET MPS 3-ND DEL (MISCELLANEOUS) ×1 IMPLANT
SPONGE T-LAP 18X18 ~~LOC~~+RFID (SPONGE) ×18 IMPLANT
SPONGE T-LAP 4X18 ~~LOC~~+RFID (SPONGE) ×5 IMPLANT
STOPCOCK 4 WAY LG BORE MALE ST (IV SETS) ×1 IMPLANT
SUPPORT HEART JANKE-BARRON (MISCELLANEOUS) ×4 IMPLANT
SURGIFLO W/THROMBIN 8M KIT (HEMOSTASIS) ×4 IMPLANT
SUT BONE WAX W31G (SUTURE) ×4 IMPLANT
SUT MNCRL AB 4-0 PS2 18 (SUTURE) IMPLANT
SUT PROLENE 3 0 SH DA (SUTURE) IMPLANT
SUT PROLENE 3 0 SH1 36 (SUTURE) IMPLANT
SUT PROLENE 4 0 RB 1 (SUTURE) ×3
SUT PROLENE 4 0 SH DA (SUTURE) ×4 IMPLANT
SUT PROLENE 4-0 RB1 .5 CRCL 36 (SUTURE) ×3 IMPLANT
SUT PROLENE 5 0 C 1 36 (SUTURE) ×2 IMPLANT
SUT PROLENE 6 0 C 1 30 (SUTURE) ×3 IMPLANT
SUT PROLENE 6 0 CC (SUTURE) ×12 IMPLANT
SUT PROLENE 8 0 BV175 6 (SUTURE) IMPLANT
SUT PROLENE BLUE 7 0 (SUTURE) ×5 IMPLANT
SUT PROLENE POLY MONO (SUTURE) ×1 IMPLANT
SUT SILK  1 MH (SUTURE)
SUT SILK 1 MH (SUTURE) IMPLANT
SUT SILK 2 0 SH CR/8 (SUTURE) ×1 IMPLANT
SUT SILK 3 0 SH CR/8 (SUTURE) IMPLANT
SUT STEEL 6MS V (SUTURE) ×8 IMPLANT
SUT STEEL SZ 6 DBL 3X14 BALL (SUTURE) ×4 IMPLANT
SUT VIC AB 1 CTX 36 (SUTURE) ×2
SUT VIC AB 1 CTX36XBRD ANBCTR (SUTURE) ×6 IMPLANT
SUT VIC AB 2-0 CT1 27 (SUTURE) ×1
SUT VIC AB 2-0 CT1 TAPERPNT 27 (SUTURE) IMPLANT
SUT VIC AB 2-0 CTX 27 (SUTURE) IMPLANT
SUT VIC AB 2-0 CTX 36 (SUTURE) ×1 IMPLANT
SUT VIC AB 3-0 X1 27 (SUTURE) IMPLANT
SUT VICRYL 3-0 27 CRC X-1 (SUTURE) ×2 IMPLANT
SYSTEM SAHARA CHEST DRAIN ATS (WOUND CARE) ×4 IMPLANT
TAPE PAPER 2X10 WHT MICROPORE (GAUZE/BANDAGES/DRESSINGS) ×1 IMPLANT
TOWEL GREEN STERILE (TOWEL DISPOSABLE) ×4 IMPLANT
TOWEL GREEN STERILE FF (TOWEL DISPOSABLE) ×4 IMPLANT
TRAY CATH LUMEN 1 20CM STRL (SET/KITS/TRAYS/PACK) ×2 IMPLANT
TRAY FOLEY SLVR 16FR TEMP STAT (SET/KITS/TRAYS/PACK) ×4 IMPLANT
TUBING ART PRESS 48 MALE/FEM (TUBING) ×2 IMPLANT
TUBING LAP HI FLOW INSUFFLATIO (TUBING) ×4 IMPLANT
UNDERPAD 30X36 HEAVY ABSORB (UNDERPADS AND DIAPERS) ×4 IMPLANT
WATER STERILE IRR 1000ML POUR (IV SOLUTION) ×8 IMPLANT

## 2021-11-12 NOTE — Progress Notes (Signed)
Received from [place] via monitored transport. Connected to bedside monitors and vent. VSS at this time. Temp 36.2. See flowsheet for full assessment.

## 2021-11-12 NOTE — Anesthesia Procedure Notes (Signed)
Central Venous Catheter Insertion Performed by: Albertha Ghee, MD, anesthesiologist Start/End5/23/2023 7:15 AM, 11/12/2021 7:18 AM Patient location: Pre-op. Preanesthetic checklist: patient identified, IV checked, site marked, risks and benefits discussed, surgical consent, monitors and equipment checked, pre-op evaluation, timeout performed and anesthesia consent Hand hygiene performed  and maximum sterile barriers used  PA cath was placed.[place]wan type:thermodilution Procedure performed without using ultrasound guided technique. Attempts: 1 Patient tolerated the procedure well with no immediate complications.

## 2021-11-12 NOTE — Progress Notes (Signed)
Rapid wean started at this time. 

## 2021-11-12 NOTE — Transfer of Care (Signed)
Immediate Anesthesia Transfer of Care Note  Patient: [name] [name]  Procedure(s) Performed: CORONARY ARTERY BYPASS GRAFTING (CABG) x  3 USING LEFT INTERNAL MAMMARY ARTERY AND RIGHT GREATER SAPHENOUS VEIN (Chest) MAZE TRANSESOPHAGEAL ECHOCARDIOGRAM (TEE) ENDOVEIN HARVEST OF GREATER SAPHENOUS VEIN  Patient Location: SICU  Anesthesia Type:General  Level of Consciousness: Patient remains intubated per anesthesia plan  Airway & Oxygen Therapy: Patient remains intubated per anesthesia plan  Post-op Assessment: Report given to RN and Post -op Vital signs reviewed and stable  Post vital signs: Reviewed and stable  Last Vitals:  Vitals Value Taken Time  BP 119/70 11/12/21 1456  Temp 36.1 C 11/12/21 1504  Pulse 89 11/12/21 1504  Resp 15 11/12/21 1504  SpO2 99 % 11/12/21 1504  Vitals shown include unvalidated device data.  Last Pain:  Vitals:   11/12/21 0631  TempSrc:   PainSc: 3          Complications: No notable events documented.

## 2021-11-12 NOTE — Progress Notes (Signed)
Pt placed on cpap/ps at this time.

## 2021-11-12 NOTE — Brief Op Note (Signed)
11/12/2021  1:49 PM  PATIENT:  [name] [name]  66 y.o. male  PRE-OPERATIVE DIAGNOSIS:  CAD  POST-OPERATIVE DIAGNOSIS:  * No post-op diagnosis entered *  PROCEDURE:  Procedure(s): CORONARY ARTERY BYPASS GRAFTING (CABG) x  3 USING LEFT INTERNAL MAMMARY ARTERY AND RIGHT GREATER SAPHENOUS VEIN (N/A) MAZE (N/A) TRANSESOPHAGEAL ECHOCARDIOGRAM (TEE) (N/A) ENDOVEIN HARVEST OF GREATER SAPHENOUS VEIN Vein harvest time: 77min Vein prep time: 77min  SURGEON:  Surgeon(s) and Role:    Dahlia Byes, MD - Primary  PHYSICIAN ASSISTANT: Aryani Daffern PA-C  ASSISTANTS: RNFA   ANESTHESIA:   general  EBL:  1083 mL   BLOOD ADMINISTERED:none  DRAINS:  LEFT PLEURAL AND MEDIASTINAL CHEST TUBES    LOCAL MEDICATIONS USED:  NONE  SPECIMEN:  No Specimen  DISPOSITION OF SPECIMEN:  N/A  COUNTS:  YES  TOURNIQUET:  * No tourniquets in log *  DICTATION: .Other Dictation: Dictation Number PENDING  PLAN OF CARE: Admit to inpatient   PATIENT DISPOSITION:  ICU - intubated and hemodynamically stable.   Delay start of Pharmacological VTE agent (>24hrs) due to surgical blood loss or risk of bleeding: yes  COMPLICATIONS: NO KNOWN

## 2021-11-12 NOTE — Procedures (Signed)
Extubation Procedure Note  Patient Details:   Name: [name] [name] DOB: 25-Jul-1955 MRN: [id number]   Airway Documentation:    Vent end date: 11/12/21 Vent end time: 2030   Evaluation  O2 sats: stable throughout Complications: No apparent complications Patient did tolerate procedure well. Bilateral Breath Sounds: Clear, Diminished   Yes  Graciella Freer 11/12/2021, 8:34 PM   Pt extubated per rapid wean. Pt able to perform VC of 750ml and NIF of -40. Pt had positive cuff leak. Pt placed on 5L [place] at this time

## 2021-11-12 NOTE — Progress Notes (Signed)
Notified  Dr. Marin Comment of pt's blood sugar 293.  Stated not to start hyperglycemic protocol. Endo tool will be started in OR

## 2021-11-12 NOTE — Anesthesia Procedure Notes (Signed)
Arterial Line Insertion Start/End5/23/2023 7:00 AM, 11/12/2021 7:15 AM Performed by: Albertha Ghee, MD, CRNA  Patient location: Pre-op. Preanesthetic checklist: patient identified, IV checked, site marked, risks and benefits discussed, surgical consent, monitors and equipment checked, pre-op evaluation, timeout performed and anesthesia consent Patient sedated Left, radial was placed Catheter size: 20 G Hand hygiene performed , maximum sterile barriers used  and Seldinger technique used Allen's test indicative of satisfactory collateral circulation Attempts: 2 Procedure performed using ultrasound guided technique. Following insertion, dressing applied and Biopatch. Post procedure complications: unsuccessful attempts. Patient tolerated the procedure well with no immediate complications.

## 2021-11-12 NOTE — Progress Notes (Signed)
  Echocardiogram Echocardiogram Transesophageal has been performed.  Fidel Levy 11/12/2021, 8:30 AM

## 2021-11-12 NOTE — H&P (Signed)
PCP is Cyndi Bender, PA-C Referring Provider is No ref. provider found  No chief complaint on file.   HPI: Patient examined, images of coronary arteriogram and echocardiogram personally reviewed and discussed with patient and wife. 66 year old obese type II diabetic with atrial fibrillation, symptomatic for 6 years.  Has had 2 cardioversions without success.  He was being evaluated for EP ablation and a calcium score cardiac CT scan was performed.  This demonstrated heavy calcification with a score of 4000.  He underwent catheterization subsequently which demonstrated moderate but three-vessel coronary disease.  The patient denies symptoms of classic angina but has noted a decrease in exercise tolerance over the past couple years.  His EF is 50% by echo without valvular disease.  His A-fib is currently controlled with Toprol-XL and he takes Xarelto.  No history of stroke or mini stroke.  Patient has not been hospitalized recently for A-fib, heart failure, or COVID.  He has never had previous surgery.  He denies previous chest trauma.  Patient's last A1c was 7.4.  His weight is 285 pounds and has been stable for months.  He is on inhalers for probable asthma versus COPD and has a remote history of smoking.  Past Medical History:  Diagnosis Date   Atrial fibrillation ([place])    Benign essential HTN 07/30/2014   Bradycardia 01/25/2015   CHF (congestive heart failure) (HCC)    Chronic anticoagulation 56/38/7564   Chronic systolic CHF (congestive heart failure), NYHA class 3 ([place]) 08/30/2014   Overview:  Global ef 30%   CKD (chronic kidney disease)    Class 3 severe obesity in adult ([place]) 04/28/2017   Coronary artery disease    Diabetes mellitus without complication ([place])    Diabetic neuropathy ([place]) 07/28/2014   Dysrhythmia    Erectile dysfunction    High risk medication use 05/26/2018   Hypertension    Hypertensive heart disease with heart failure ([place]) 07/30/2014   Lumbago    LV  dysfunction 04/28/2017   Mixed hyperlipidemia 07/28/2014   OSA (obstructive sleep apnea)    CPAP   Paroxysmal atrial fibrillation ([place]) 07/28/2014   Sinus node dysfunction ([place]) 04/21/2016   Testicular hypofunction    Uncontrolled type 2 diabetes mellitus with microalbuminuric diabetic nephropathy 07/28/2014    Past Surgical History:  Procedure Laterality Date   ELECTROPHYSIOLOGIC STUDY N/A 01/18/2015   Procedure: CARDIOVERSION;  Surgeon: [name], MD;  Location: [facility] ORS;  Service: Cardiovascular;  Laterality: N/A;   EYE SURGERY Bilateral 2022   LEFT HEART CATH AND CORONARY ANGIOGRAPHY N/A 10/24/2021   Procedure: LEFT HEART CATH AND CORONARY ANGIOGRAPHY;  Surgeon: Jettie Booze, MD;  Location: [facility];  Service: Cardiovascular;  Laterality: N/A;    Family History  Problem Relation Age of Onset   CAD Brother    Drug abuse Brother    Lung cancer Father    CAD Mother     Social History Social History   Tobacco Use   Smoking status: Former    Types: Cigarettes    Quit date: 2007    Years since quitting: 16.4   Smokeless tobacco: Never  Vaping Use   Vaping Use: Never used  Substance Use Topics   Alcohol use: No   Drug use: Yes    Frequency: 1.0 times per week    Types: Marijuana    Current Facility-Administered Medications  Medication Dose Route Frequency Provider Last Rate Last Admin   0.9 % irrigation (POUR BTL)    PRN  Dahlia Byes, MD   5,000 mL at 11/12/21 8372   ceFAZolin (ANCEF) IVPB 2g/100 mL premix  2 g Intravenous To OR Dahlia Byes, MD       ceFAZolin (ANCEF) IVPB 3g/100 mL premix  3 g Intravenous To OR Dahlia Byes, MD       chlorhexidine (HIBICLENS) 4 % liquid 2 application.  30 mL Topical UD Dahlia Byes, MD       Derrill Memo ON 11/13/2021] chlorhexidine (PERIDEX) 0.12 % solution 15 mL  15 mL Mouth/Throat Once Dahlia Byes, MD       [facility], Patient & Family Education   Does not apply Once Dahlia Byes, MD       dexmedetomidine (PRECEDEX) 400 MCG/100ML (4 mcg/mL) infusion  0.1-0.7 mcg/kg/hr Intravenous To OR Dahlia Byes, MD       EPINEPHrine (ADRENALIN) 5 mg in NS 250 mL (0.02 mg/mL) premix infusion  0-10 mcg/min Intravenous To OR Dahlia Byes, MD       hemostatic agents (no charge) Optime    PRN Dahlia Byes, MD   1 application. at 11/12/21 0730   hemostatic agents (no charge) Optime    PRN Dahlia Byes, MD   1 application. at 11/12/21 0732   heparin 30,000 units/NS 1000 mL solution for CELLSAVER   Other To OR Dahlia Byes, MD       heparin sodium (porcine) 2,500 Units, papaverine 30 mg in electrolyte-A (PLASMALYTE-A PH 7.4) 500 mL irrigation   Irrigation To OR Dahlia Byes, MD       heparin sodium (porcine) 2,500 Units, papaverine 30 mg in electrolyte-A (PLASMALYTE-A PH 7.4) 500 mL irrigation    PRN Dahlia Byes, MD   500 mL at 11/12/21 0726   insulin regular, human (MYXREDLIN) 100 units/ 100 mL infusion   Intravenous To OR Dahlia Byes, MD       lactated ringers infusion   Intravenous Continuous Hodierne, Adam, MD       magnesium sulfate (IV Push/IM) injection 40 mEq  40 mEq Other To OR Dahlia Byes, MD       metoprolol tartrate (LOPRESSOR) tablet 12.5 mg  12.5 mg Oral Once Dahlia Byes, MD       milrinone (PRIMACOR) 20 MG/100 ML (0.2 mg/mL) infusion  0.3 mcg/kg/min Intravenous To OR Dahlia Byes, MD       nitroGLYCERIN 50 mg in dextrose 5 % 250 mL (0.2 mg/mL) infusion  2-200 mcg/min Intravenous To OR Dahlia Byes, MD       norepinephrine (LEVOPHED) 65m in 2534m(0.016 mg/mL) premix infusion  0-40 mcg/min Intravenous To OR VaDahlia ByesMD       phenylephrine (NEO-SYNEPHRINE) 2042mS 250m67memix infusion  30-200 mcg/min Intravenous To OR VantDahlia Byes       potassium chloride injection 80 mEq  80 mEq Other To OR VantDahlia Byes       Surgiflo with Thrombin (Hemostatic Matrix Kit) Optime    PRN VantDahlia Byes   1 application. at  11/12/21 0728   Surgifoam 1 Gm with 0.9% sodium chloride (4 ml) topical solution    PRN VantDahlia Byes   12 mL at 11/12/21 0731   tranexamic acid (CYKLOKAPRON) 2,500 mg in sodium chloride 0.9 % 250 mL (10 mg/mL) infusion  1.5 mg/kg/hr Intravenous To OR VantDahlia Byes       tranexamic acid (CYKLOKAPRON) bolus via infusion - over 30 minutes 1,095 mg  15 mg/kg (Ideal) Intravenous To OR VantDahlia Byes  tranexamic acid (CYKLOKAPRON) pump prime solution 189 mg  2 mg/kg (Adjusted) Intracatheter To OR Dahlia Byes, MD       vancomycin (VANCOREADY) IVPB 1500 mg/300 mL  1,500 mg Intravenous To OR Dahlia Byes, MD       Facility-Administered Medications Ordered in Other Encounters  Medication Dose Route Frequency Provider Last Rate Last Admin   midazolam (VERSED) injection   Intravenous Anesthesia Intra-op Imagene Riches, CRNA   2 mg at 11/12/21 0704    No Known Allergies  Review of Systems:                   Review of Systems :  [ y ] = yes, [  ] = no        General :  Weight gain [   ]    Weight loss  [   ]  Fatigue [  ]  Fever [  ]  Chills  [  ]                                          HEENT    Headache [  ]  Dizziness [  ]  Blurred vision [  ] Glaucoma  [  ]                          Nosebleeds [  ] Painful or loose teeth [  ]        Cardiac :  Chest pain/ pressure [  ]  Resting SOB [  ] exertional SOB [  ]                        Orthopnea [  ]  Pedal edema  [  ]  Palpitations [ y ] Syncope/presyncope _0                         Paroxysmal nocturnal dyspnea [  ]         Pulmonary : cough [  ]  wheezing [ y ]  Hemoptysis [  ] Sputum [  ] Snoring [  ]                              Pneumothorax [  ]  Sleep apnea [ y uses CPAP]        GI : Vomiting [  ]  Dysphagia [  ]  Melena  [  ]  Abdominal pain [  ] BRBPR [  ]              Heart burn [  ]  Constipation [  ] Diarrhea  [  ] Colonoscopy [   ]        GU : Hematuria [  ]  Dysuria [  ]  Nocturia [  ] UTI's [  ]         Vascular : Claudication [  ]  Rest pain [  ]  DVT [  ] Vein stripping [  ] leg ulcers [  ]                          TIA [  ] Stroke [  ]  Varicose veins [  ]        NEURO :  Headaches  [  ] Seizures [  ] Vision changes [  ] Paresthesias [  ]                                               Musculoskeletal :  Arthritis [  ] Gout  [  ]  Back pain [ y ]  Joint pain [  ]        Skin :  Rash [  ]  Melanoma [  ] Sores [  ]        Heme : Bleeding problems [  ]Clotting Disorders [  ] Anemia [  ]Blood Transfusion _0         Endocrine : Diabetes [ y ] Heat or Cold intolerance [  ] Polyuria [  ]excessive thirst _1         Psych : Depression [  ]  Anxiety [  ]  Psych hospitalizations [  ] Memory change [  ]                                                                            BP 125/72   Pulse 97   Temp 98 F (36.7 C) (Oral)   Resp 18   Ht _2  (1.778 m)   Wt 127.1 kg   SpO2 97%   BMI 40.21 kg/m  Physical Exam:     Physical Exam  General: Obese middle-aged white male no acute distress accompanied by his wife. HEENT: Normocephalic pupils equal , dentition adequate with bilateral plates Neck: Supple without JVD, adenopathy, or bruit Chest: Clear to auscultation, symmetrical breath sounds, no rhonchi, no tenderness             or deformity Cardiovascular: Regular rate and rhythm, no murmur, no gallop, peripheral pulses             palpable in all extremities Abdomen:  Soft, large protuberant abdominal contour, nontender, no palpable mass or organomegaly Extremities: Warm, well-perfused, no clubbing cyanosis edema or tenderness,              no venous stasis changes of the legs Rectal/GU: Deferred Neuro: Grossly non--focal and symmetrical throughout Skin: Clean and dry without rash or ulceration    Diagnostic Tests: Patient has 70% stenosis of the circumflex, 70% stenosis of the LAD diagonal, 50% stenosis of the proximal RCA normal LVEDP.  Impression: Paroxysmal atrial  fibrillation approxi-6 years duration Cardiac CT calcium score 4000 with moderate three-vessel disease, stenoses 60-70%.  Plan: Plan combined multivessel CABG with Maze procedure.  Surgery scheduled for May 23.  Patient will stop his Xarelto 1 week before surgery.  Patient will stop the metformin 48 hours prior to surgery.  He will need to to complete his preoperative evaluation with PFTs and carotid Dopplers which will be scheduled. We discussed the details of the procedure including the benefits and risks and he demonstrates his understanding agrees to proceed.  He understands he is at increased risk for  postoperative pulmonary problems because of his obesity and past history of smoking.   Dahlia Byes, MD [facility] [phone]   Pre Procedure note for inpatients:   Andrick Rust has been scheduled for Procedure(s): CORONARY ARTERY BYPASS GRAFTING (CABG) (N/A) MAZE (N/A) TRANSESOPHAGEAL ECHOCARDIOGRAM (TEE) (N/A) today. The various methods of treatment have been discussed with the patient. After consideration of the risks, benefits and treatment options the patient has consented to the planned procedure.   The patient has been seen and labs reviewed. There are no changes in the patient's condition to prevent proceeding with the planned procedure today.  Recent labs:  Lab Results  Component Value Date   WBC 8.7 11/08/2021   HGB 13.1 11/08/2021   HCT 41.1 11/08/2021   PLT 230 11/08/2021   GLUCOSE 298 (H) 11/08/2021   ALT 23 11/08/2021   AST 21 11/08/2021   NA 138 11/08/2021   K 4.3 11/08/2021   CL 105 11/08/2021   CREATININE 1.59 (H) 11/08/2021   BUN 29 (H) 11/08/2021   CO2 20 (L) 11/08/2021   INR 0.9 11/08/2021   HGBA1C 9.3 (H) 11/08/2021    Dahlia Byes, MD 11/12/2021 7:49 AM

## 2021-11-12 NOTE — Anesthesia Procedure Notes (Signed)
  Central Venous Catheter Insertion Performed by: Albertha Ghee, MD, anesthesiologist Start/End5/23/2023 7:02 AM, 11/12/2021 7:15 AM Patient location: Pre-op. Preanesthetic checklist: patient identified, IV checked, site marked, risks and benefits discussed, surgical consent, monitors and equipment checked, pre-op evaluation, timeout performed and anesthesia consent Lidocaine 1% used for infiltration and patient sedated Hand hygiene performed  and maximum sterile barriers used  Catheter size: 9 Fr MAC introducer Procedure performed using ultrasound guided technique. Ultrasound Notes:anatomy identified, needle tip was noted to be adjacent to the nerve/plexus identified, no ultrasound evidence of intravascular and/or intraneural injection and image(s) printed for medical record Attempts: 1 Following insertion, line sutured and dressing applied. Post procedure assessment: blood return through all ports, free fluid flow and no air  Patient tolerated the procedure well with no immediate complications.

## 2021-11-12 NOTE — Progress Notes (Signed)
      [address]       [place] [address]             [phone]      S/p CABG  Intubated, sedated  BP 94/65   Pulse 89   Temp (!) 96.8 F (36 C)   Resp 16   Ht 5\' 10"  (1.778 m)   Wt 127.1 kg   SpO2 100%   BMI 40.21 kg/m  39/26 CI= 1.9  Intake/Output Summary (Last 24 hours) at 11/12/2021 1804 Last data filed at 11/12/2021 1700 Gross per 24 hour  Intake 5566.81 ml  Output 3103 ml  Net 2463.81 ml   Minimal CT output  Hct 33, K 4.8  Doing well early postop  Remo Lipps C. Roxan Hockey, MD [facility] [phone]

## 2021-11-12 NOTE — Plan of Care (Signed)
  Problem: Education: Goal: Will demonstrate proper wound care and an understanding of methods to prevent future damage Outcome: Progressing Goal: Knowledge of disease or condition will improve Outcome: Progressing Goal: Knowledge of the prescribed therapeutic regimen will improve Outcome: Progressing   Problem: Respiratory: Goal: Respiratory status will improve Outcome: Progressing

## 2021-11-12 NOTE — Anesthesia Procedure Notes (Signed)
Procedure Name: Intubation Date/Time: 11/12/2021 8:14 AM Performed by: Imagene Riches, CRNA Pre-anesthesia Checklist: Patient identified, Emergency Drugs available, Suction available and Patient being monitored Patient Re-evaluated:Patient Re-evaluated prior to induction Oxygen Delivery Method: Circle System Utilized Preoxygenation: Pre-oxygenation with 100% oxygen Induction Type: IV induction Ventilation: Mask ventilation without difficulty Laryngoscope Size: Miller and 3 Grade View: Grade I Tube type: Oral Tube size: 8.0 mm Number of attempts: 1 Airway Equipment and Method: Stylet and Oral airway Placement Confirmation: ETT inserted through vocal cords under direct vision, positive ETCO2 and breath sounds checked- equal and bilateral Secured at: 22 cm Tube secured with: Tape Dental Injury: Teeth and Oropharynx as per pre-operative assessment

## 2021-11-12 NOTE — Op Note (Unsigned)
NAME: [name], [name] MEDICAL RECORD NO: [id number] ACCOUNT NO: [id number] DATE OF BIRTH: January 23, 1956 FACILITY: [facility] LOCATION: [facility] PHYSICIAN: Ivin Poot III, MD  Operative Report   DATE OF PROCEDURE: 11/12/2021  OPERATION:   1.  Coronary artery bypass grafting x3 (left internal mammary artery to LAD, saphenous vein graft to OM1, saphenous vein graft to posterior descending). 2.  Left atrial maze procedure with radiofrequency ablation to bilateral pulmonary vein isolation. 3.  Endoscopic harvest of right and left leg greater saphenous vein.  SURGEON:  Len Childs, MD  ASSISTANT:  Jadene Pierini, PA-C.  A surgical first assistant was required due to the complexity of this operation and to maintain the standard of care for surgical heart procedures at this institution.  First assistant was needed to endoscopically harvest  the saphenous vein from both legs, to close the surgical incisions in the legs, and to assist with the distal anastomoses including suture management, exposure, suctioning, and general assistance.  PREOPERATIVE DIAGNOSES:   1.  Severe 3-vessel coronary artery disease. 2.  History of paroxysmal atrial fibrillation. 3.  Morbid obesity and type 2 diabetes.  POSTOPERATIVE DIAGNOSES:   1.  Severe 3-vessel coronary artery disease. 2.  History of paroxysmal atrial fibrillation. 3.  Morbid obesity and type 2 diabetes.  ANESTHESIA:  General by Dr. Quita Skye ***.  DESCRIPTION OF PROCEDURE:  Informed consent was documented in the preoperative holding area and any final questions were addressed with the patient in a face-to-face encounter.  The patient was then brought directly to the operating room and placed  supine on the operating room table.  Under invasive hemodynamic monitoring, the patient was induced for anesthesia, intubated and prepared for surgery.  The patient remained stable.  A transesophageal echo probe was placed by the anesthesia team.  The patient was then  prepped and draped as a sterile field.  This required several personnel because of the patient's morbid obesity.  The chest, abdomen and legs were prepped and draped as a sterile field.  A proper timeout was performed.  At the initial part of the operation, the A line was not functioning well and anesthesia requested a more secure A line.  For that reason, a femoral A line was placed using the Seldinger technique in left femoral artery to allow adequate monitoring of  blood pressure and to obtain blood for testing during the procedure.  A sternal incision was then made as the saphenous vein was harvested endoscopically first on the right thigh and then the left thigh.  The vein below the knees was not adequate for use as conduit.  The left internal mammary artery was harvested as a  pedicle graft from its origin at the subclavian vessels.  It was a 1.5 mm vessel with excellent flow.  Harvesting of the vessel was challenging due to the patient's obese body habitus and took a considerable time.  The sternal retractor was then placed using the deep blades due to the obese body habitus.  The pericardium was opened and suspended.  Pursestrings were placed in the ascending aorta and right atrium and after the vein was harvested, heparin was  administered.  The patient was then cannulated and placed on cardiopulmonary bypass.  The distal coronary anastomoses were identified for grafting.  Cardioplegia cannulas were placed both antegrade and retrograde cold blood cardioplegia.  The mammary  artery and vein grafts were prepared for the distal anastomoses and cardioplegia cannulas were then placed as well.  The patient was cooled  to 32 degrees.  Aortic crossclamp was applied.  One liter of cold blood cardioplegia was delivered in split doses  between the antegrade aortic and retrograde coronary sinus catheters.  There was good cardioplegic arrest and supple temperature drop less than 12 degrees.  Cardioplegia was  delivered every 20 minutes while the crossclamp was placed.  Under cardioplegic arrest, the maze procedure ablation lines were then placed.  The heart was mobilized and retracted to the right side of the patient to expose the left pulmonary vein confluence to the left atrium.  The ligament of Ruthann Cancer was taken  down and a vessel loop was placed around the left sided pulmonary veins.  Through this space, the radiofrequency ablation clamp was applied for 3 sets of 2 applications each.  The ablation clamp was then removed.  Then, the heart was mobilized and retracted to the patient's left side to expose the right sided pulmonary vein confluence to the left atrium.  The ligament between the superior pulmonary vein on the right and the right main pulmonary artery was opened  and a vessel loop was encircled around the right sided pulmonary veins.  Using the radiofrequency clamp, another 3 sets of radiofrequency ablation lines were placed around the right sided pulmonary veins as the confluence to the left atrium.  The clamp  was then removed and cardioplegia was redosed.  The first distal coronary anastomosis was performed to the posterior descending.  This was a 1.5 mm vessel with proximal high-grade stenosis.  A reverse saphenous vein was sewn end-to-side with running 7-0 Prolene with good flow through the graft.   Cardioplegia was redosed.  The second distal anastomosis was to the OM1 branch of the left circumflex.  This had a proximal 80% stenosis.  A reverse saphenous vein was sewn end-to-side with running 7-0 Prolene with good flow through the graft.  Cardioplegia was redosed.  The third distal anastomosis was then placed through the distal third of the LAD.  There was a proximal 90% stenosis.  The left IMA pedicle was brought through an opening in the left lateral pericardium, was brought down onto the LAD and sewn end-to-side  with running 8-0 Prolene.  There was good flow through the anastomosis  after briefly releasing the pedicle bulldog and the mammary artery.  The bulldog was reapplied and the pedicle was secured to epicardium.  Cardioplegia was redosed.  While the crossclamp was still in place, 2 proximal vein anastomoses were performed on the ascending aorta using a 4.5 mm punch and running 6-0 Prolene.  Prior to tying down the final proximal anastomoses, air was vented from the coronaries with a dose  of retrograde warm blood cardioplegia.  The crossclamp was removed.  The heart was cardioverted back to a regular rhythm.  It was not in atrial fibrillation.  The vein grafts were de-aired and opened.  Each had good flow and hemostasis was documented at the proximal and distal sites.  The patient was rewarmed and reperfused.  Temporary pacing wires were applied.  The lungs were expanded and ventilator was resumed.  The patient was weaned off cardiopulmonary bypass without difficulty on low dose milrinone.  Echo showed preservation of  LV systolic function.  Protamine was administered and the cannulas were removed.  The mediastinum was irrigated.  The superior pericardial fat was closed over the aorta and vein grafts.  Anterior mediastinal and left pleural chest tube were placed and  brought out through separate incisions.  The sternal wires were then  placed and the sternum was closed.  The patient remained stable.  The pectoralis fascia was closed with a running #1 Vicryl.  Subcutaneous and skin layers were closed with running Vicryl and sterile dressings were applied.   Total cardiopulmonary bypass time was 160 minutes.   VAI D: 11/12/2021 4:35:55 pm T: 11/12/2021 11:15:00 pm  JOB: [id number]/ [id number]

## 2021-11-13 ENCOUNTER — Inpatient Hospital Stay (HOSPITAL_COMMUNITY): Payer: Medicare HMO

## 2021-11-13 ENCOUNTER — Encounter (HOSPITAL_COMMUNITY): Payer: Self-pay | Admitting: Cardiothoracic Surgery

## 2021-11-13 DIAGNOSIS — Z951 Presence of aortocoronary bypass graft: Secondary | ICD-10-CM

## 2021-11-13 DIAGNOSIS — R609 Edema, unspecified: Secondary | ICD-10-CM | POA: Diagnosis not present

## 2021-11-13 DIAGNOSIS — I5033 Acute on chronic diastolic (congestive) heart failure: Secondary | ICD-10-CM | POA: Diagnosis not present

## 2021-11-13 DIAGNOSIS — R570 Cardiogenic shock: Secondary | ICD-10-CM | POA: Diagnosis not present

## 2021-11-13 LAB — POCT I-STAT 7, (LYTES, BLD GAS, ICA,H+H)
Acid-base deficit: 5 mmol/L — ABNORMAL HIGH (ref 0.0–2.0)
Acid-base deficit: 3 mmol/L — ABNORMAL HIGH (ref 0.0–2.0)
Acid-base deficit: 3 mmol/L — ABNORMAL HIGH (ref 0.0–2.0)
Acid-base deficit: 4 mmol/L — ABNORMAL HIGH (ref 0.0–2.0)
Acid-base deficit: 5 mmol/L — ABNORMAL HIGH (ref 0.0–2.0)
Acid-base deficit: 9 mmol/L — ABNORMAL HIGH (ref 0.0–2.0)
Acid-base deficit: 8 mmol/L — ABNORMAL HIGH (ref 0.0–2.0)
Bicarbonate: 22.5 mmol/L (ref 20.0–28.0)
Bicarbonate: 22.2 mmol/L (ref 20.0–28.0)
Bicarbonate: 22 mmol/L (ref 20.0–28.0)
Bicarbonate: 21.6 mmol/L (ref 20.0–28.0)
Bicarbonate: 21.5 mmol/L (ref 20.0–28.0)
Bicarbonate: 18.6 mmol/L — ABNORMAL LOW (ref 20.0–28.0)
Bicarbonate: 18.8 mmol/L — ABNORMAL LOW (ref 20.0–28.0)
Calcium, Ion: 1.11 mmol/L — ABNORMAL LOW (ref 1.15–1.40)
Calcium, Ion: 1.04 mmol/L — ABNORMAL LOW (ref 1.15–1.40)
Calcium, Ion: 1.06 mmol/L — ABNORMAL LOW (ref 1.15–1.40)
Calcium, Ion: 1.06 mmol/L — ABNORMAL LOW (ref 1.15–1.40)
Calcium, Ion: 1.07 mmol/L — ABNORMAL LOW (ref 1.15–1.40)
Calcium, Ion: 1.08 mmol/L — ABNORMAL LOW (ref 1.15–1.40)
Calcium, Ion: 1.05 mmol/L — ABNORMAL LOW (ref 1.15–1.40)
HCT: 32 % — ABNORMAL LOW (ref 39.0–52.0)
HCT: 33 % — ABNORMAL LOW (ref 39.0–52.0)
HCT: 30 % — ABNORMAL LOW (ref 39.0–52.0)
HCT: 30 % — ABNORMAL LOW (ref 39.0–52.0)
HCT: 31 % — ABNORMAL LOW (ref 39.0–52.0)
HCT: 30 % — ABNORMAL LOW (ref 39.0–52.0)
HCT: 30 % — ABNORMAL LOW (ref 39.0–52.0)
Hemoglobin: 10.9 g/dL — ABNORMAL LOW (ref 13.0–17.0)
Hemoglobin: 11.2 g/dL — ABNORMAL LOW (ref 13.0–17.0)
Hemoglobin: 10.2 g/dL — ABNORMAL LOW (ref 13.0–17.0)
Hemoglobin: 10.2 g/dL — ABNORMAL LOW (ref 13.0–17.0)
Hemoglobin: 10.5 g/dL — ABNORMAL LOW (ref 13.0–17.0)
Hemoglobin: 10.2 g/dL — ABNORMAL LOW (ref 13.0–17.0)
Hemoglobin: 10.2 g/dL — ABNORMAL LOW (ref 13.0–17.0)
O2 Saturation: 98 %
O2 Saturation: 99 %
O2 Saturation: 98 %
O2 Saturation: 97 %
O2 Saturation: 93 %
O2 Saturation: 91 %
O2 Saturation: 90 %
Patient temperature: 36.1
Patient temperature: 36.2
Patient temperature: 36.7
Patient temperature: 36.4
Patient temperature: 36.2
Patient temperature: 37
Patient temperature: 36.4
Potassium: 4.5 mmol/L (ref 3.5–5.1)
Potassium: 4.8 mmol/L (ref 3.5–5.1)
Potassium: 4.6 mmol/L (ref 3.5–5.1)
Potassium: 4.4 mmol/L (ref 3.5–5.1)
Potassium: 4.4 mmol/L (ref 3.5–5.1)
Potassium: 4.8 mmol/L (ref 3.5–5.1)
Potassium: 5.2 mmol/L — ABNORMAL HIGH (ref 3.5–5.1)
Sodium: 139 mmol/L (ref 135–145)
Sodium: 137 mmol/L (ref 135–145)
Sodium: 139 mmol/L (ref 135–145)
Sodium: 138 mmol/L (ref 135–145)
Sodium: 137 mmol/L (ref 135–145)
Sodium: 133 mmol/L — ABNORMAL LOW (ref 135–145)
Sodium: 134 mmol/L — ABNORMAL LOW (ref 135–145)
TCO2: 24 mmol/L (ref 22–32)
TCO2: 23 mmol/L (ref 22–32)
TCO2: 23 mmol/L (ref 22–32)
TCO2: 23 mmol/L (ref 22–32)
TCO2: 23 mmol/L (ref 22–32)
TCO2: 20 mmol/L — ABNORMAL LOW (ref 22–32)
TCO2: 20 mmol/L — ABNORMAL LOW (ref 22–32)
pCO2 arterial: 49.4 mmHg — ABNORMAL HIGH (ref 32–48)
pCO2 arterial: 38.9 mmHg (ref 32–48)
pCO2 arterial: 40.2 mmHg (ref 32–48)
pCO2 arterial: 41.6 mmHg (ref 32–48)
pCO2 arterial: 42.1 mmHg (ref 32–48)
pCO2 arterial: 45.4 mmHg (ref 32–48)
pCO2 arterial: 43 mmHg (ref 32–48)
pH, Arterial: 7.262 — ABNORMAL LOW (ref 7.35–7.45)
pH, Arterial: 7.36 (ref 7.35–7.45)
pH, Arterial: 7.345 — ABNORMAL LOW (ref 7.35–7.45)
pH, Arterial: 7.32 — ABNORMAL LOW (ref 7.35–7.45)
pH, Arterial: 7.311 — ABNORMAL LOW (ref 7.35–7.45)
pH, Arterial: 7.221 — ABNORMAL LOW (ref 7.35–7.45)
pH, Arterial: 7.246 — ABNORMAL LOW (ref 7.35–7.45)
pO2, Arterial: 120 mmHg — ABNORMAL HIGH (ref 83–108)
pO2, Arterial: 165 mmHg — ABNORMAL HIGH (ref 83–108)
pO2, Arterial: 116 mmHg — ABNORMAL HIGH (ref 83–108)
pO2, Arterial: 101 mmHg (ref 83–108)
pO2, Arterial: 71 mmHg — ABNORMAL LOW (ref 83–108)
pO2, Arterial: 74 mmHg — ABNORMAL LOW (ref 83–108)
pO2, Arterial: 66 mmHg — ABNORMAL LOW (ref 83–108)

## 2021-11-13 LAB — BASIC METABOLIC PANEL
Anion gap: 9 (ref 5–15)
Anion gap: 12 (ref 5–15)
BUN: 25 mg/dL — ABNORMAL HIGH (ref 8–23)
BUN: 28 mg/dL — ABNORMAL HIGH (ref 8–23)
CO2: 20 mmol/L — ABNORMAL LOW (ref 22–32)
CO2: 18 mmol/L — ABNORMAL LOW (ref 22–32)
Calcium: 7.1 mg/dL — ABNORMAL LOW (ref 8.9–10.3)
Calcium: 7.5 mg/dL — ABNORMAL LOW (ref 8.9–10.3)
Chloride: 107 mmol/L (ref 98–111)
Chloride: 104 mmol/L (ref 98–111)
Creatinine, Ser: 1.87 mg/dL — ABNORMAL HIGH (ref 0.61–1.24)
Creatinine, Ser: 2.6 mg/dL — ABNORMAL HIGH (ref 0.61–1.24)
GFR, Estimated: 39 mL/min — ABNORMAL LOW (ref >60)
GFR, Estimated: 26 mL/min — ABNORMAL LOW (ref >60)
Glucose, Bld: 155 mg/dL — ABNORMAL HIGH (ref 70–99)
Glucose, Bld: 267 mg/dL — ABNORMAL HIGH (ref 70–99)
Potassium: 4.2 mmol/L (ref 3.5–5.1)
Potassium: 4.8 mmol/L (ref 3.5–5.1)
Sodium: 136 mmol/L (ref 135–145)
Sodium: 134 mmol/L — ABNORMAL LOW (ref 135–145)

## 2021-11-13 LAB — PROCALCITONIN: Procalcitonin: 0.7 ng/mL

## 2021-11-13 LAB — CBC
HCT: 31.8 % — ABNORMAL LOW (ref 39.0–52.0)
HCT: 31.3 % — ABNORMAL LOW (ref 39.0–52.0)
Hemoglobin: 10.6 g/dL — ABNORMAL LOW (ref 13.0–17.0)
Hemoglobin: 10.2 g/dL — ABNORMAL LOW (ref 13.0–17.0)
MCH: 30.8 pg (ref 26.0–34.0)
MCH: 31.1 pg (ref 26.0–34.0)
MCHC: 33.3 g/dL (ref 30.0–36.0)
MCHC: 32.6 g/dL (ref 30.0–36.0)
MCV: 92.4 fL (ref 80.0–100.0)
MCV: 95.4 fL (ref 80.0–100.0)
Platelets: 194 10*3/uL (ref 150–400)
Platelets: 192 10*3/uL (ref 150–400)
RBC: 3.44 MIL/uL — ABNORMAL LOW (ref 4.22–5.81)
RBC: 3.28 MIL/uL — ABNORMAL LOW (ref 4.22–5.81)
RDW: 14.1 % (ref 11.5–15.5)
RDW: 14.5 % (ref 11.5–15.5)
WBC: 22.3 10*3/uL — ABNORMAL HIGH (ref 4.0–10.5)
WBC: 28.3 10*3/uL — ABNORMAL HIGH (ref 4.0–10.5)
nRBC: 0 % (ref 0.0–0.2)
nRBC: 0 % (ref 0.0–0.2)

## 2021-11-13 LAB — GLUCOSE, CAPILLARY
Glucose-Capillary: 139 mg/dL — ABNORMAL HIGH (ref 70–99)
Glucose-Capillary: 146 mg/dL — ABNORMAL HIGH (ref 70–99)
Glucose-Capillary: 151 mg/dL — ABNORMAL HIGH (ref 70–99)
Glucose-Capillary: 152 mg/dL — ABNORMAL HIGH (ref 70–99)
Glucose-Capillary: 152 mg/dL — ABNORMAL HIGH (ref 70–99)
Glucose-Capillary: 162 mg/dL — ABNORMAL HIGH (ref 70–99)
Glucose-Capillary: 163 mg/dL — ABNORMAL HIGH (ref 70–99)
Glucose-Capillary: 168 mg/dL — ABNORMAL HIGH (ref 70–99)
Glucose-Capillary: 176 mg/dL — ABNORMAL HIGH (ref 70–99)
Glucose-Capillary: 178 mg/dL — ABNORMAL HIGH (ref 70–99)
Glucose-Capillary: 181 mg/dL — ABNORMAL HIGH (ref 70–99)
Glucose-Capillary: 187 mg/dL — ABNORMAL HIGH (ref 70–99)
Glucose-Capillary: 229 mg/dL — ABNORMAL HIGH (ref 70–99)

## 2021-11-13 LAB — MAGNESIUM
Magnesium: 2.7 mg/dL — ABNORMAL HIGH (ref 1.7–2.4)
Magnesium: 2.6 mg/dL — ABNORMAL HIGH (ref 1.7–2.4)

## 2021-11-13 LAB — COOXEMETRY PANEL
Carboxyhemoglobin: 0.3 % — ABNORMAL LOW (ref 0.5–1.5)
Carboxyhemoglobin: 0.7 % (ref 0.5–1.5)
Methemoglobin: <0.7 % (ref 0.0–1.5)
Methemoglobin: <0.7 % (ref 0.0–1.5)
O2 Saturation: 58.3 %
O2 Saturation: 73.9 %
Total hemoglobin: 9.6 g/dL — ABNORMAL LOW (ref 12.0–16.0)
Total hemoglobin: 10.2 g/dL — ABNORMAL LOW (ref 12.0–16.0)

## 2021-11-13 LAB — ECHOCARDIOGRAM COMPLETE
AR max vel: 5.36 cm2
AV Area VTI: 6.12 cm2
AV Area mean vel: 5.17 cm2
AV Mean grad: 2.5 mmHg
AV Peak grad: 4.8 mmHg
Ao pk vel: 1.09 m/s
Area-P 1/2: 3.91 cm2
Height: 70 in
MV VTI: 2.44 cm2
S' Lateral: 4 cm
Weight: 4730.19 oz

## 2021-11-13 MED ORDER — SODIUM BICARBONATE 8.4 % IV SOLN
INTRAVENOUS | Status: DC
  Filled 2021-11-13 (×3): qty 1000

## 2021-11-13 MED ORDER — AMIODARONE LOAD VIA INFUSION
150.0000 mg | Freq: Once | INTRAVENOUS | Status: AC
  Administered 2021-11-13: 150 mg via INTRAVENOUS
  Filled 2021-11-13: qty 83.34

## 2021-11-13 MED ORDER — PERFLUTREN LIPID MICROSPHERE
1.0000 mL | INTRAVENOUS | Status: AC | PRN
  Administered 2021-11-13: 2 mL via INTRAVENOUS

## 2021-11-13 MED ORDER — NOREPINEPHRINE 4 MG/250ML-% IV SOLN
0.0000 ug/min | INTRAVENOUS | Status: DC
  Administered 2021-11-13: 4 ug/min via INTRAVENOUS
  Filled 2021-11-13 (×2): qty 250

## 2021-11-13 MED ORDER — FUROSEMIDE 10 MG/ML IJ SOLN
60.0000 mg | Freq: Once | INTRAMUSCULAR | Status: AC
  Administered 2021-11-13: 60 mg via INTRAVENOUS
  Filled 2021-11-13: qty 6

## 2021-11-13 MED ORDER — MORPHINE SULFATE (PF) 2 MG/ML IV SOLN
1.0000 mg | INTRAVENOUS | Status: DC | PRN
  Administered 2021-11-13 – 2021-11-14 (×3): 4 mg via INTRAVENOUS
  Filled 2021-11-13 (×3): qty 2

## 2021-11-13 MED ORDER — LIDOCAINE 5 % EX PTCH
2.0000 | MEDICATED_PATCH | CUTANEOUS | Status: DC
  Administered 2021-11-13 – 2021-11-25 (×9): 2 via TRANSDERMAL
  Filled 2021-11-13 (×8): qty 2

## 2021-11-13 MED ORDER — INSULIN DETEMIR 100 UNIT/ML ~~LOC~~ SOLN
24.0000 [IU] | Freq: Two times a day (BID) | SUBCUTANEOUS | Status: DC
  Administered 2021-11-13 (×2): 24 [IU] via SUBCUTANEOUS
  Filled 2021-11-13 (×4): qty 0.24

## 2021-11-13 MED ORDER — DOPAMINE-DEXTROSE 3.2-5 MG/ML-% IV SOLN
2.5000 ug/kg/min | INTRAVENOUS | Status: DC
  Administered 2021-11-13: 2.5 ug/kg/min via INTRAVENOUS
  Filled 2021-11-13: qty 250

## 2021-11-13 MED ORDER — SODIUM BICARBONATE 8.4 % IV SOLN
50.0000 meq | Freq: Once | INTRAVENOUS | Status: AC
  Administered 2021-11-13: 50 meq via INTRAVENOUS

## 2021-11-13 MED ORDER — INSULIN ASPART 100 UNIT/ML IJ SOLN
0.0000 [IU] | INTRAMUSCULAR | Status: DC
  Administered 2021-11-13: 16 [IU] via SUBCUTANEOUS
  Administered 2021-11-13: 4 [IU] via SUBCUTANEOUS
  Administered 2021-11-13: 8 [IU] via SUBCUTANEOUS
  Administered 2021-11-14 (×3): 20 [IU] via SUBCUTANEOUS

## 2021-11-13 MED ORDER — AMIODARONE HCL IN DEXTROSE 360-4.14 MG/200ML-% IV SOLN
60.0000 mg/h | INTRAVENOUS | Status: AC
  Administered 2021-11-13 – 2021-11-14 (×6): 60 mg/h via INTRAVENOUS
  Filled 2021-11-13: qty 400
  Filled 2021-11-13 (×9): qty 200

## 2021-11-13 MED ORDER — METOCLOPRAMIDE HCL 5 MG/ML IJ SOLN
10.0000 mg | Freq: Four times a day (QID) | INTRAMUSCULAR | Status: DC
  Administered 2021-11-13 – 2021-11-22 (×37): 10 mg via INTRAVENOUS
  Filled 2021-11-13 (×37): qty 2

## 2021-11-13 MED ORDER — ALBUMIN HUMAN 5 % IV SOLN
12.5000 g | Freq: Once | INTRAVENOUS | Status: AC
  Administered 2021-11-13: 12.5 g via INTRAVENOUS

## 2021-11-13 MED ORDER — FUROSEMIDE 10 MG/ML IJ SOLN
15.0000 mg/h | INTRAVENOUS | Status: DC
  Administered 2021-11-13: 10 mg/h via INTRAVENOUS
  Administered 2021-11-14 – 2021-11-15 (×3): 15 mg/h via INTRAVENOUS
  Filled 2021-11-13 (×4): qty 20

## 2021-11-13 MED ORDER — POTASSIUM CHLORIDE CRYS ER 20 MEQ PO TBCR
40.0000 meq | EXTENDED_RELEASE_TABLET | Freq: Once | ORAL | Status: DC
  Filled 2021-11-13: qty 2

## 2021-11-13 MED ORDER — NOREPINEPHRINE 16 MG/250ML-% IV SOLN
0.0000 ug/min | INTRAVENOUS | Status: DC
  Administered 2021-11-13: 14 ug/min via INTRAVENOUS
  Administered 2021-11-14: 11 ug/min via INTRAVENOUS
  Filled 2021-11-13 (×5): qty 250

## 2021-11-13 MED ORDER — ALBUMIN HUMAN 25 % IV SOLN
25.0000 g | Freq: Four times a day (QID) | INTRAVENOUS | Status: AC
  Administered 2021-11-13 – 2021-11-14 (×4): 25 g via INTRAVENOUS
  Filled 2021-11-13 (×4): qty 100

## 2021-11-13 MED ORDER — SODIUM BICARBONATE 8.4 % IV SOLN
25.0000 meq | Freq: Once | INTRAVENOUS | Status: AC
  Administered 2021-11-13: 25 meq via INTRAVENOUS

## 2021-11-13 MED ORDER — ALBUMIN HUMAN 5 % IV SOLN: 250.0000 mL | INTRAVENOUS | Status: AC | PRN

## 2021-11-13 MED ORDER — VANCOMYCIN HCL IN DEXTROSE 1-5 GM/200ML-% IV SOLN
1000.0000 mg | Freq: Once | INTRAVENOUS | Status: AC
  Administered 2021-11-13: 1000 mg via INTRAVENOUS
  Filled 2021-11-13: qty 200

## 2021-11-13 MED ORDER — CALCIUM CHLORIDE 10 % IV SOLN
1.0000 g | Freq: Once | INTRAVENOUS | Status: AC
  Administered 2021-11-13: 1 g via INTRAVENOUS

## 2021-11-13 MED ORDER — METOLAZONE 2.5 MG PO TABS
2.5000 mg | ORAL_TABLET | Freq: Once | ORAL | Status: AC
Start: 2021-11-13 — End: 2021-11-13
  Administered 2021-11-13: 2.5 mg via ORAL
  Filled 2021-11-13: qty 1

## 2021-11-13 MED FILL — Sodium Chloride IV Soln 0.9%: INTRAVENOUS | Qty: 2000 | Status: AC

## 2021-11-13 MED FILL — Lidocaine HCl Local Soln Prefilled Syringe 100 MG/5ML (2%): INTRAMUSCULAR | Qty: 5 | Status: AC

## 2021-11-13 MED FILL — Potassium Chloride Inj 2 mEq/ML: INTRAVENOUS | Qty: 40 | Status: AC

## 2021-11-13 MED FILL — Heparin Sodium (Porcine) Inj 1000 Unit/ML: Qty: 1000 | Status: AC

## 2021-11-13 MED FILL — Mannitol IV Soln 20%: INTRAVENOUS | Qty: 500 | Status: AC

## 2021-11-13 MED FILL — Magnesium Sulfate Inj 50%: INTRAMUSCULAR | Qty: 10 | Status: AC

## 2021-11-13 MED FILL — Heparin Sodium (Porcine) Inj 1000 Unit/ML: INTRAMUSCULAR | Qty: 20 | Status: AC

## 2021-11-13 MED FILL — Albumin, Human Inj 5%: INTRAVENOUS | Qty: 250 | Status: AC

## 2021-11-13 MED FILL — Electrolyte-R (PH 7.4) Solution: INTRAVENOUS | Qty: 6000 | Status: AC

## 2021-11-13 MED FILL — Sodium Bicarbonate IV Soln 8.4%: INTRAVENOUS | Qty: 50 | Status: AC

## 2021-11-13 NOTE — Consult Note (Addendum)
Advanced Heart Failure Team Consult Note   Primary Physician: Cyndi Bender, PA-C PCP-Cardiologist:  Dr. Bettina Gavia EP: Dr. Curt Bears  Reason for Consultation: Acute on chronic CHF (Presumably HFpEF)  HPI:    [name] [name] is seen today for evaluation of acute on chronic HF (presumed HFpEF) at the request of Dr. Darcey Nora.  66 y.o. male with history of atrial fibrillation, prior CM with normalization of EF, CKD, sick sinus syndrome, OSA, HTN, HLD, DM II, prior smoker.   Has been seen by EP as outpatient for afib. Planned to undergo afib ablation but found to have coronary calcium score >4000 including LM calcification during workup. Ablation was cancelled. LHC 10/24/21 with severe LM disease extending into ostial LAD, ostial Cx disease (small), diffuse disease RCA. He was referred to TCTS for consideration of CABG.  Admitted yesterday for planned CABG with MAZE. Underwent CABG X 3 (LIMA to LAD, SVG to OM1, SVG to posterior descending) and MAZE/PVI.   He was extubated yesterday evening.   CO-OX 58% this am on milrinone 0.25, 50 Neo. Worsening renal impairment and oliguria this am. Weight up 15 lb. Dopamine added. Subsequently developed hypotension and NSVT/?Afib with RVR. Dopamine stopped. Given amp of bicarb and albumin. Has since been started on NE at 5.  MAPs 60s this afternoon on 0.25 milrinone, NE 5, 40 NE.  Scr 1.45>>1.82 (baseline ~ 1.5).   Feels okay. Up sitting in chair.   ? Currently SR/sinus tach 100s.  Review of Systems: [y] = yes, [ ]  = no   General: Weight gain [Y]; Weight loss [ ] ; Anorexia [ ] ; Fatigue [Y]; Fever [ ] ; Chills [ ] ; Weakness [ ]   Cardiac: Chest pain/pressure [Y]; Resting SOB [ ] ; Exertional SOB [ ] ; Orthopnea [ ] ; Pedal Edema [Y]; Palpitations [ ] ; Syncope [ ] ; Presyncope [ ] ; Paroxysmal nocturnal dyspnea[ ]   Pulmonary: Cough [ ] ; Wheezing[ ] ; Hemoptysis[ ] ; Sputum [ ] ; Snoring [ ]   GI: Vomiting[ ] ; Dysphagia[ ] ; Melena[ ] ; Hematochezia [ ] ; Heartburn[ ] ;  Abdominal pain [ ] ; Constipation [ ] ; Diarrhea [ ] ; BRBPR [ ]   GU: Hematuria[ ] ; Dysuria [ ] ; Nocturia[ ]   Vascular: Pain in legs with walking [ ] ; Pain in feet with lying flat [ ] ; Non-healing sores [ ] ; Stroke [ ] ; TIA [ ] ; Slurred speech [ ] ;  Neuro: Headaches[ ] ; Vertigo[ ] ; Seizures[ ] ; Paresthesias[ ] ;Blurred vision [ ] ; Diplopia [ ] ; Vision changes [ ]   Ortho/Skin: Arthritis [ ] ; Joint pain [ ] ; Muscle pain [ ] ; Joint swelling [ ] ; Back Pain [ ] ; Rash [ ]   Psych: Depression[ ] ; Anxiety[ ]   Heme: Bleeding problems [ ] ; Clotting disorders [ ] ; Anemia [ ]   Endocrine: Diabetes [Y]; Thyroid dysfunction[ ]   Home Medications Prior to Admission medications   Medication Sig Start Date End Date Taking? Authorizing Provider  Dulaglutide (TRULICITY) 3 WU/1.3KG SOPN Inject 3 mg into the skin every Monday.   Yes [provider]  FARXIGA 10 MG TABS tablet Take 10 mg by mouth daily. 08/13/18  Yes [provider]  fluticasone furoate-vilanterol (BREO ELLIPTA) 200-25 MCG/ACT AEPB Inhale 1 puff into the lungs daily. 01/02/18  Yes [provider]  furosemide (LASIX) 20 MG tablet Take 20 mg by mouth daily.   Yes [provider]  glipiZIDE (GLUCOTROL) 10 MG tablet Take 10 mg by mouth 2 (two) times daily before a meal.   Yes [provider]  losartan (COZAAR) 50 MG tablet Take 50 mg by  mouth daily. 10/02/20  Yes [provider]  metFORMIN (GLUCOPHAGE) 1000 MG tablet Take 1 tablet (1,000 mg total) by mouth 2 (two) times daily with a meal. 10/26/21  Yes Jettie Booze, MD  metoprolol succinate (TOPROL-XL) 50 MG 24 hr tablet Take 1 tablet (50 mg total) by mouth daily. Take with or immediately following a meal. 10/21/21  Yes Camnitz, Ocie Doyne, MD  rivaroxaban (XARELTO) 20 MG TABS tablet Take 1 tablet (20 mg total) by mouth daily with supper. 10/25/21  Yes Jettie Booze, MD  rosuvastatin (CRESTOR) 20 MG tablet Take 20 mg by mouth daily. 03/19/21  Yes  [provider]  albuterol (VENTOLIN HFA) 108 (90 Base) MCG/ACT inhaler Inhale 2 puffs into the lungs every 6 (six) hours as needed for wheezing or shortness of breath.    [provider]    Past Medical History: Past Medical History:  Diagnosis Date   Atrial fibrillation ([place])    Benign essential HTN 07/30/2014   Bradycardia 01/25/2015   CHF (congestive heart failure) (HCC)    Chronic anticoagulation 62/69/4854   Chronic systolic CHF (congestive heart failure), NYHA class 3 ([place]) 08/30/2014   Overview:  Global ef 30%   CKD (chronic kidney disease)    Class 3 severe obesity in adult ([place]) 04/28/2017   Coronary artery disease    Diabetes mellitus without complication ([place])    Diabetic neuropathy ([place]) 07/28/2014   Dysrhythmia    Erectile dysfunction    High risk medication use 05/26/2018   Hypertension    Hypertensive heart disease with heart failure ([place]) 07/30/2014   Lumbago    LV dysfunction 04/28/2017   Mixed hyperlipidemia 07/28/2014   OSA (obstructive sleep apnea)    CPAP   Paroxysmal atrial fibrillation ([place]) 07/28/2014   Sinus node dysfunction ([place]) 04/21/2016   Testicular hypofunction    Uncontrolled type 2 diabetes mellitus with microalbuminuric diabetic nephropathy 07/28/2014    Past Surgical History: Past Surgical History:  Procedure Laterality Date   CORONARY ARTERY BYPASS GRAFT N/A 11/12/2021   Procedure: CORONARY ARTERY BYPASS GRAFTING (CABG) x  3 USING LEFT INTERNAL MAMMARY ARTERY AND RIGHT GREATER SAPHENOUS VEIN;  Surgeon: Dahlia Byes, MD;  Location: [facility];  Service: Open Heart Surgery;  Laterality: N/A;   ELECTROPHYSIOLOGIC STUDY N/A 01/18/2015   Procedure: CARDIOVERSION;  Surgeon: [name], MD;  Location: [facility];  Service: Cardiovascular;  Laterality: N/A;   ENDOVEIN HARVEST OF GREATER SAPHENOUS VEIN  11/12/2021   Procedure: ENDOVEIN HARVEST OF GREATER SAPHENOUS VEIN;  Surgeon: Dahlia Byes, MD;  Location: [place] [facility];  Service:  Open Heart Surgery;;   EYE SURGERY Bilateral 2022   LEFT HEART CATH AND CORONARY ANGIOGRAPHY N/A 10/24/2021   Procedure: LEFT HEART CATH AND CORONARY ANGIOGRAPHY;  Surgeon: Jettie Booze, MD;  Location: [facility];  Service: Cardiovascular;  Laterality: N/A;   MAZE N/A 11/12/2021   Procedure: MAZE;  Surgeon: Dahlia Byes, MD;  Location: [facility];  Service: Open Heart Surgery;  Laterality: N/A;   TEE WITHOUT CARDIOVERSION N/A 11/12/2021   Procedure: TRANSESOPHAGEAL ECHOCARDIOGRAM (TEE);  Surgeon: Dahlia Byes, MD;  Location: [facility];  Service: Open Heart Surgery;  Laterality: N/A;    Family History: Family History  Problem Relation Age of Onset   CAD Brother    Drug abuse Brother    Lung cancer Father    CAD Mother     Social History: Social History   Socioeconomic History   Marital status: Married  Spouse name: Not on file   Number of children: Not on file   Years of education: Not on file   Highest education level: Not on file  Occupational History   Not on file  Tobacco Use   Smoking status: Former    Types: Cigarettes    Quit date: 2007    Years since quitting: 16.4   Smokeless tobacco: Never  Vaping Use   Vaping Use: Never used  Substance and Sexual Activity   Alcohol use: No   Drug use: Yes    Frequency: 1.0 times per week    Types: Marijuana   Sexual activity: Not on file  Other Topics Concern   Not on file  Social History Narrative   Not on file   Social Determinants of Health   Financial Resource Strain: Not on file  Food Insecurity: Not on file  Transportation Needs: Not on file  Physical Activity: Not on file  Stress: Not on file  Social Connections: Not on file    Allergies:  No Known Allergies  Objective:    Vital Signs:   Temp:  [96.4 F (35.8 C)-97.9 F (36.6 C)] 97.3 F (36.3 C) (05/24 1200) Pulse Rate:  [85-178] 87 (05/24 1200) Resp:  [12-29] 17 (05/24 1200) BP: (88-141)/(51-88) 98/53 (05/24 1200) SpO2:  [81 %-100  %] 96 % (05/24 1200) Arterial Line BP: (80-142)/(43-81) 107/43 (05/24 1200) FiO2 (%):  [40 %-50 %] 40 % (05/23 2002) Weight:  [134.1 kg] 134.1 kg (05/24 0222) Last BM Date :  (PTA)  Weight change: Filed Weights   11/12/21 0602 11/13/21 0222  Weight: 127.1 kg 134.1 kg    Intake/Output:   Intake/Output Summary (Last 24 hours) at 11/13/2021 1324 Last data filed at 11/13/2021 1200 Gross per 24 hour  Intake 5371.79 ml  Output 3298 ml  Net 2073.79 ml      Physical Exam    General: No distress. Sitting up in chair. HEENT: normal Neck: supple. JVP difficult to assess d/t neck size. Carotids 2+ bilat; no bruits.  Cor: PMI nondisplaced. Regular rate & rhythm, tachy. No rubs, gallops or murmurs. Dressing noted on sternal incision. CT sites stable. Lungs: clear Abdomen: obese, soft, nontender, + distended. Extremities: no cyanosis, clubbing, rash, 1 + edema Neuro: alert & orientedx3, cranial nerves grossly intact. moves all 4 extremities w/o difficulty. Affect pleasant   Telemetry   ? Sinus tach  EKG    05/23: Sinus tach 110  Labs   Basic Metabolic Panel: Recent Labs  Lab 11/08/21 1047 11/12/21 0825 11/12/21 1133 11/12/21 1204 11/12/21 1234 11/12/21 1304 11/12/21 1338 11/12/21 1341 11/12/21 2029 11/12/21 2057 11/12/21 2125 11/13/21 0528 11/13/21 0805  NA 138   < > 137   < > 136   < > 137   < > 139 138 138 136 137  K 4.3   < > 4.4   < > 5.3*   < > 4.8   < > 4.6 4.2 4.4 4.2 4.4  CL 105   < > 101  --  102  --  102  --   --  110  --  107  --   CO2 20*  --   --   --   --   --   --   --   --  21*  --  20*  --   GLUCOSE 298*   < > 142*  --  163*  --  168*  --   --  158*  --  155*  --   BUN 29*   < > 26*  --  25*  --  26*  --   --  24*  --  25*  --   CREATININE 1.59*   < > 1.40*  --  1.30*  --  1.30*  --   --  1.45*  --  1.87*  --   CALCIUM 9.1  --   --   --   --   --   --   --   --  7.0*  --  7.1*  --   MG  --   --   --   --   --   --   --   --   --  2.7*  --  2.7*  --     < > = values in this interval not displayed.    Liver Function Tests: Recent Labs  Lab 11/08/21 1047  AST 21  ALT 23  ALKPHOS 50  BILITOT 0.7  PROT 7.1  ALBUMIN 4.0   No results for input(s): LIPASE, AMYLASE in the last 168 hours. No results for input(s): AMMONIA in the last 168 hours.  CBC: Recent Labs  Lab 11/08/21 1047 11/12/21 0825 11/12/21 1253 11/12/21 1304 11/12/21 1504 11/12/21 1641 11/12/21 2029 11/12/21 2057 11/12/21 2125 11/13/21 0528 11/13/21 0805  WBC 8.7  --   --   --  19.4*  --   --  18.3*  --  22.3*  --   HGB 13.1   < > 9.1*   < > 10.9*  10.9*   < > 10.2* 10.6* 10.2* 10.6* 10.5*  HCT 41.1   < > 26.2*   < > 32.0*  33.2*   < > 30.0* 31.7* 30.0* 31.8* 31.0*  MCV 94.5  --   --   --  93.3  --   --  92.4  --  92.4  --   PLT 230  --  154  --  185  --   --  187  --  194  --    < > = values in this interval not displayed.    Cardiac Enzymes: No results for input(s): CKTOTAL, CKMB, CKMBINDEX, TROPONINI in the last 168 hours.  BNP: BNP (last 3 results) No results for input(s): BNP in the last 8760 hours.  ProBNP (last 3 results) No results for input(s): PROBNP in the last 8760 hours.   CBG: Recent Labs  Lab 11/13/21 0630 11/13/21 0734 11/13/21 0841 11/13/21 1002 11/13/21 1247  GLUCAP 146* 152* 151* 178* 168*    Coagulation Studies: Recent Labs    11/12/21 1504  LABPROT 17.3*  INR 1.4*     Imaging   DG Chest Port 1 View  Result Date: 11/13/2021 CLINICAL DATA:  Status post CABG, dyspnea EXAM: PORTABLE CHEST 1 VIEW COMPARISON:  Chest radiograph from one day prior. FINDINGS: Interval extubation and removal of enteric tube. Stable right internal jugular Swan-Ganz catheter terminating over the main pulmonary artery. Intact sternotomy wires. Stable mid to upper left chest tube. Stable cardiomediastinal silhouette with mild to moderate cardiomegaly. No pneumothorax. No pleural effusion. Cephalization of the pulmonary vasculature without  overt pulmonary edema. Mild left retrocardiac atelectasis, similar. IMPRESSION: 1. No pneumothorax. 2. Stable mild to moderate cardiomegaly without overt pulmonary edema. 3. Stable mild left retrocardiac atelectasis. Electronically Signed   By: Ilona Sorrel M.D.   On: 11/13/2021 08:09   DG Chest Port 1 View  Result Date: 11/12/2021 CLINICAL DATA:  Status post CABG. EXAM: PORTABLE CHEST 1 VIEW COMPARISON:  Nov 08, 2021 FINDINGS: Surgical changes of median sternotomy and CABG. Right IJ Swan-Ganz catheter with tip projecting over the main pulmonary artery. Endotracheal tube with tip projecting over the midthoracic trachea. Enteric tube courses below the diaphragm with tip obscured by collimation. Mild cardiac enlargement at least somewhat accentuated by technique. Low lung volumes with dependent atelectasis. No visible pleural effusion or pneumothorax. IMPRESSION: 1. Postsurgical change of median sternotomy and CABG with low lung volumes and dependent atelectasis. 2. Mild cardiac enlargement. No overt pulmonary edema or pleural effusions. Electronically Signed   By: Dahlia Bailiff M.D.   On: 11/12/2021 15:16     Medications:     Current Medications:  acetaminophen  1,000 mg Oral Q6H   Or   acetaminophen (TYLENOL) oral liquid 160 mg/5 mL  1,000 mg Per Tube Q6H   acetaminophen (TYLENOL) oral liquid 160 mg/5 mL  650 mg Per Tube Once   Or   acetaminophen  650 mg Rectal Once   aspirin EC  325 mg Oral Daily   Or   aspirin  324 mg Per Tube Daily   bisacodyl  10 mg Oral Daily   Or   bisacodyl  10 mg Rectal Daily   Chlorhexidine Gluconate Cloth  6 each Topical Daily   docusate sodium  200 mg Oral Daily   fluticasone furoate-vilanterol  1 puff Inhalation Daily   insulin aspart  0-24 Units Subcutaneous Q4H   insulin detemir  24 Units Subcutaneous BID   levalbuterol  1.25 mg Nebulization Q6H   lidocaine  2 patch Transdermal Q24H   metoCLOPramide (REGLAN) injection  10 mg Intravenous Q6H   metoprolol  tartrate  12.5 mg Oral BID   Or   metoprolol tartrate  12.5 mg Per Tube BID   mupirocin cream   Topical BID   [START ON 11/14/2021] pantoprazole  40 mg Oral Daily   rosuvastatin  20 mg Oral Daily   sodium chloride flush  3 mL Intravenous Q12H    Infusions:  sodium chloride Stopped (11/13/21 1019)   sodium chloride     sodium chloride 20 mL/hr at 11/12/21 1509   albumin human 12.5 g (11/12/21 1923)   amiodarone 30 mg/hr (11/13/21 1200)    ceFAZolin (ANCEF) IV 2 g (11/13/21 1319)   lactated ringers 20 mL/hr at 11/13/21 1200   lactated ringers 20 mL/hr at 11/12/21 1600   milrinone 0.25 mcg/kg/min (11/13/21 1200)   nitroGLYCERIN 0 mcg/min (11/12/21 1450)   norepinephrine (LEVOPHED) Adult infusion 5 mcg/min (11/13/21 1200)   phenylephrine (NEO-SYNEPHRINE) Adult infusion 40 mcg/min (11/13/21 1200)      Patient Profile   66 y.o. male with history of CM with recovered EF, persistent atrial fibrillation, DM II, CKD, sick sinus syndrome, OSA, prior smoker. Underwent CABG X 3 and MAZE/PVI on 05/23>>post-op decrease in UOP and AKI with volume overload  Assessment/Plan   Acute on chronic CHF, presumably HFpEF: -Prior CM with recovered EF -Echo 02/23: EF 50-55% -S/p CABG 05/23 -CO-OX 58% this am on milrinone 0.25. Also on Neo 50>>down to 40 this afternoon -Did not tolerate dopamine d/t hypotension, runs PVCs/NSVT and ? Afib with RVR. Now on NE 5. -Stop Neo, titrate NE as needed to keep SBP > 100. Diastolic pressures have been 40s so MAP may be lower. -Follow CO-OX -Volume looks elevated, weight up 15 lb. Little urine output today. Give 60 mg lasix IV once. May need lasix gtt depending on response. -Set  up CVP monitoring -Titrate GDMT once off pressors -Repeat echo  2. CAD: -s/p CABG X 3 on 05/23 -On aspirin and statin  3. Persistent atrial fibrillation: -S/p MAZE/PVI at time of CABG -? Afib with RVR this am/early afternoon. Some tele strips look sinus with PACs with extra counts  by tele -Continue amiodarone gtt until off inotrope support -SR with PACs on ECG this afternoon -Anticoagulation when okay with TCTS  4. AKI on CKD: -Scr 1.45>>1.87, baseline looks around 1.5 -decreased UOP today, watch closely.  -Avoid hypotension -titrate NE as above to keep SBP > 100  5. Leukocytosis: -WBC 18>22, possibly reactive post-op -ATX on CXR today -AF -Follow  6. DM II: -A1c 9.3 in 05/23 -Eventual SGLT2i  Length of Stay: 1  FINCH, LINDSAY N, PA-C  11/13/2021, 1:24 PM  Advanced Heart Failure Team Pager [phone] (M-F; 7a - 5p)  Please contact [facility] for night-coverage after hours (4p -7a ) and weekends on [email or web address]   Patient seen with PA, agree with the above note.  Patient had CABG + maze + LA appendage clip yesterday.  Poor UOP overnight, creatinine up to 1.87.  Co-ox 58% this morning, CVP > 20.  He was started on dopamine but became excessively tachycardic, may be sinus tachy with PACs/PVCs versus atrial fibrillation.  Dopamine was stopped, now on milrinone 0.125, NE 5, and phenylephrine 40.  HR now 100s, looks like NSR on ECG. He is on amiodarone gtt 60.   Pre-op echo with EF 50-55%.  Echo was done this afternoon, was very poor study.  Unable to visualize RV, no significant pericardial effusion, the LV looks grossly like EF is preserved.   General: NAD Neck: Thick, JVP difficult, no thyromegaly or thyroid nodule.  Lungs: Clear to auscultation bilaterally with normal respiratory effort. CV: Nondisplaced PMI.  Heart regular S1/S2, no S3/S4, no murmur.  1+ ankle edema.  Abdomen: Soft, nontender, no hepatosplenomegaly, no distention.  Skin: Intact without lesions or rashes.  Neurologic: Alert and oriented x 3.  Psych: Normal affect. Extremities: No clubbing or cyanosis.  HEENT: Normal.   Echo difficult, but grossly LV EF looks preserved.  Unable to visualize RV.  There does not appear to be a large pericardial effusion.  CVP > 20.   - Shock, ?due to  vasoplegia or RV failure. Afebrile, think sepsis less likely, will send procalcitonin.  - Will try to get additional echo images.  - Lasix gtt 10 mg/hr and will add dose of metolazone 2.5. - Continue milrinone 0.125 for now, will try to titrate of phenylephrine in favor or norepinephrine if BP requires.  - Follow creatinine closely, up to 1.87 from 1.45.   Currently, patient is in NSR.  Now s/p Maze. Earlier, looks most likely ST with PACs.  Regardless, now off dopamine.  - Continue amiodarone gtt while on inotropes/pressors.  - Restart anticoagulation eventually.   Loralie Champagne 11/13/2021 3:57 PM

## 2021-11-13 NOTE — Consult Note (Signed)
NAME:  [name] [name], MRN:  [id number], DOB:  1955/09/15, LOS: 1 ADMISSION DATE:  11/12/2021, CONSULTATION DATE:  11/13/2021 REFERRING MD:  Dr. Aundra Dubin , CHIEF COMPLAINT:  Respiratory distress    History of Present Illness:  [name] [name] is a 66 y.o. male with a PMH significant for but not limited to CAD now s/p CABG, systolic congestive heart failure, CKD, type 2 diabetes, HTN, A-fib chronically anticoagulated, and OSA who presented to the ED 5/23 for elective CABG with MAZE procedure Dr. Lawson Fiscal.  Patient was extubated per heart failure protocol with no incident by by afternoon of 5/24 PCCM was consulted for progressive increase work of breathing.   Pertinent  Medical History  CAD no s/p CABG, systolic congestive heart failure, CKD, type 2 diabetes, HTN, A-fib chronically anticoagulated, and OSA   Significant Hospital Events: Including procedures, antibiotic start and stop dates in addition to other pertinent events   5/23 underwent CABG with MAZE procedure with Dr. Lawson Fiscal 5/24 PCCM consulted for progressive increased work of breathing   Interim History / Subjective:  Slightly lethargic but easily arousable    Objective   Blood pressure (!) 112/49, pulse 78, temperature (!) 97 F (36.1 C), resp. rate 16, height 5\' 10"  (1.778 m), weight 134.1 kg, SpO2 94 %. PAP: (34-64)/(15-34) 61/23 CO:  [4.5 L/min-6.3 L/min] 6.3 L/min CI:  [1.9 L/min/m2-2.6 L/min/m2] 2.6 L/min/m2  Vent Mode: PSV;CPAP FiO2 (%):  [40 %] 40 % Set Rate:  [4 bmp] 4 bmp Vt Set:  [650 mL] 650 mL PEEP:  [5 cmH20] 5 cmH20 Pressure Support:  [5 cmH20] 5 cmH20 Plateau Pressure:  [24 cmH20] 24 cmH20   Intake/Output Summary (Last 24 hours) at 11/13/2021 1625 Last data filed at 11/13/2021 1500 Gross per 24 hour  Intake 3236.36 ml  Output 1515 ml  Net 1721.36 ml   Filed Weights   11/12/21 0602 11/13/21 0222  Weight: 127.1 kg 134.1 kg    Examination: General: Acute on chronically ill appearing elderly male  lying  in bed, in NAD HEENT: [place]/AT, MM pink/moist, PERRL,  Neuro: Sleepy but alert and oriented x3 CV: s1s2 regular rate and rhythm, no murmur, rubs, or gallops,  PULM:  Slightly diminished with no increased work of breathing, on 5L   GI: soft, bowel sounds active in all 4 quadrants, non-tender, non-distended Extremities: warm/dry, no edema  Skin: no rashes or lesions  Resolved Hospital Problem list     Assessment & Plan:  CAD now S/P CABG x3 -Underwent elective CABG with Dr. Lawson Fiscal 5/23 Hx of HTN/HLD  -Home medications include; Cozaar, Lopressor, and Crestor  Hx of a-fib anticoagulated at baseline P: Primary management per CTS Progress per CTS pathway  Continuous telemetry  Resume home medications when able  Continue IV amio drip, Milrinone Continue pressors for MAP goal > 65  Resume home Xarelto per CTS   At risk respiratory compromises  -felt secondary to possible pulmonary edema in the setting of third spacing and possible developing ileus  Hs of OSA with use on CPAP at baseline  P: Continue Supplemental oxygen for sat greater than 90% Head of bed elevated 30 degrees Follow intermittent chest x-ray and ABG   Ensure adequate pulmonary hygiene  Obtain KUB to rule out obstruction pattern  CPAP at HS BDs as scheduled     History of type 2 diabetes  -Home medications include Glipizide, Metformin, and Farxiga  P: Continue SSI with long acting insulin Continue home Farxig Hold home Glipizide and Metformin  CBG  q 4 hrs    Oliguric AKI superimposed on CKD stage 3a -Creatinine 1.59 with GFR 497 11/07/2021, currently creatinine 2.6 with GFR 26 by afternoon of 5/24 P: Follow renal function  Monitor urine output Trend Bmet Avoid nephrotoxins Ensure adequate renal perfusion   Lasix drip with aggressive albumin resuscitation    Best Practice (right click and "Reselect all SmartList Selections" daily)   Diet/type: NPO DVT prophylaxis: SCD GI prophylaxis: PPI Lines:  Central line Foley:  Yes, and it is still needed Code Status:  full code Last date of multidisciplinary goals of care discussion: Per primary   Labs   CBC: Recent Labs  Lab 11/08/21 1047 11/12/21 0825 11/12/21 1253 11/12/21 1304 11/12/21 1504 11/12/21 1641 11/12/21 2029 11/12/21 2057 11/12/21 2125 11/13/21 0528 11/13/21 0805  WBC 8.7  --   --   --  19.4*  --   --  18.3*  --  22.3*  --   HGB 13.1   < > 9.1*   < > 10.9*  10.9*   < > 10.2* 10.6* 10.2* 10.6* 10.5*  HCT 41.1   < > 26.2*   < > 32.0*  33.2*   < > 30.0* 31.7* 30.0* 31.8* 31.0*  MCV 94.5  --   --   --  93.3  --   --  92.4  --  92.4  --   PLT 230  --  154  --  185  --   --  187  --  194  --    < > = values in this interval not displayed.    Basic Metabolic Panel: Recent Labs  Lab 11/08/21 1047 11/12/21 0825 11/12/21 1133 11/12/21 1204 11/12/21 1234 11/12/21 1304 11/12/21 1338 11/12/21 1341 11/12/21 2029 11/12/21 2057 11/12/21 2125 11/13/21 0528 11/13/21 0805  NA 138   < > 137   < > 136   < > 137   < > 139 138 138 136 137  K 4.3   < > 4.4   < > 5.3*   < > 4.8   < > 4.6 4.2 4.4 4.2 4.4  CL 105   < > 101  --  102  --  102  --   --  110  --  107  --   CO2 20*  --   --   --   --   --   --   --   --  21*  --  20*  --   GLUCOSE 298*   < > 142*  --  163*  --  168*  --   --  158*  --  155*  --   BUN 29*   < > 26*  --  25*  --  26*  --   --  24*  --  25*  --   CREATININE 1.59*   < > 1.40*  --  1.30*  --  1.30*  --   --  1.45*  --  1.87*  --   CALCIUM 9.1  --   --   --   --   --   --   --   --  7.0*  --  7.1*  --   MG  --   --   --   --   --   --   --   --   --  2.7*  --  2.7*  --    < > = values in this interval not  displayed.   GFR: Estimated Creatinine Clearance: 52.8 mL/min (A) (by C-G formula based on SCr of 1.87 mg/dL (H)). Recent Labs  Lab 11/08/21 1047 11/12/21 1504 11/12/21 2057 11/13/21 0528  WBC 8.7 19.4* 18.3* 22.3*    Liver Function Tests: Recent Labs  Lab 11/08/21 1047  AST 21  ALT 23   ALKPHOS 50  BILITOT 0.7  PROT 7.1  ALBUMIN 4.0   No results for input(s): LIPASE, AMYLASE in the last 168 hours. No results for input(s): AMMONIA in the last 168 hours.  ABG    Component Value Date/Time   PHART 7.311 (L) 11/13/2021 0805   PCO2ART 42.1 11/13/2021 0805   PO2ART 71 (L) 11/13/2021 0805   HCO3 21.5 11/13/2021 0805   TCO2 23 11/13/2021 0805   ACIDBASEDEF 5.0 (H) 11/13/2021 0805   O2SAT 93 11/13/2021 0805     Coagulation Profile: Recent Labs  Lab 11/08/21 1047 11/12/21 1504  INR 0.9 1.4*    Cardiac Enzymes: No results for input(s): CKTOTAL, CKMB, CKMBINDEX, TROPONINI in the last 168 hours.  HbA1C: Hgb A1c MFr Bld  Date/Time Value Ref Range Status  11/08/2021 10:47 AM 9.3 (H) 4.8 - 5.6 % Final    Comment:    (NOTE) Pre diabetes:          5.7%-6.4%  Diabetes:              >6.4%  Glycemic control for   <7.0% adults with diabetes     CBG: Recent Labs  Lab 11/13/21 0734 11/13/21 0841 11/13/21 1002 11/13/21 1247 11/13/21 1606  GLUCAP 152* 151* 178* 168* 229*    Review of Systems:   Please see the history of present illness. All other systems reviewed and are negative   Past Medical History:  He,  has a past medical history of Atrial fibrillation ([place]), Benign essential HTN (07/30/2014), Bradycardia (01/25/2015), CHF (congestive heart failure) ([place]), Chronic anticoagulation (12/14/7626), Chronic systolic CHF (congestive heart failure), NYHA class 3 ([place]) (08/30/2014), CKD (chronic kidney disease), Class 3 severe obesity in adult [facility]) (04/28/2017), Coronary artery disease, Diabetes mellitus without complication ([place]), Diabetic neuropathy ([place]) (07/28/2014), Dysrhythmia, Erectile dysfunction, High risk medication use (05/26/2018), Hypertension, Hypertensive heart disease with heart failure ([place]) (07/30/2014), Lumbago, LV dysfunction (04/28/2017), Mixed hyperlipidemia (07/28/2014), OSA (obstructive sleep apnea), Paroxysmal atrial fibrillation ([place])  (07/28/2014), Sinus node dysfunction ([place]) (04/21/2016), Testicular hypofunction, and Uncontrolled type 2 diabetes mellitus with microalbuminuric diabetic nephropathy (07/28/2014).   Surgical History:   Past Surgical History:  Procedure Laterality Date   CORONARY ARTERY BYPASS GRAFT N/A 11/12/2021   Procedure: CORONARY ARTERY BYPASS GRAFTING (CABG) x  3 USING LEFT INTERNAL MAMMARY ARTERY AND RIGHT GREATER SAPHENOUS VEIN;  Surgeon: Dahlia Byes, MD;  Location: [place];  Service: Open Heart Surgery;  Laterality: N/A;   ELECTROPHYSIOLOGIC STUDY N/A 01/18/2015   Procedure: CARDIOVERSION;  Surgeon: [name], MD;  Location: [facility];  Service: Cardiovascular;  Laterality: N/A;   ENDOVEIN HARVEST OF GREATER SAPHENOUS VEIN  11/12/2021   Procedure: ENDOVEIN HARVEST OF GREATER SAPHENOUS VEIN;  Surgeon: Dahlia Byes, MD;  Location: [place];  Service: Open Heart Surgery;;   EYE SURGERY Bilateral 2022   LEFT HEART CATH AND CORONARY ANGIOGRAPHY N/A 10/24/2021   Procedure: LEFT HEART CATH AND CORONARY ANGIOGRAPHY;  Surgeon: Jettie Booze, MD;  Location: [place];  Service: Cardiovascular;  Laterality: N/A;   MAZE N/A 11/12/2021   Procedure: MAZE;  Surgeon: Dahlia Byes, MD;  Location: [facility];  Service: Open Heart Surgery;  Laterality: N/A;   TEE WITHOUT CARDIOVERSION N/A 11/12/2021   Procedure: TRANSESOPHAGEAL ECHOCARDIOGRAM (TEE);  Surgeon: Dahlia Byes, MD;  Location: [facility];  Service: Open Heart Surgery;  Laterality: N/A;     Social History:   reports that he quit smoking about 16 years ago. His smoking use included cigarettes. He has never used smokeless tobacco. He reports current drug use. Frequency: 1.00 time per week. Drug: Marijuana. He reports that he does not drink alcohol.   Family History:  His family history includes CAD in his brother and mother; Drug abuse in his brother; Lung cancer in his father.   Allergies No Known Allergies   Home Medications  Prior to  Admission medications   Medication Sig Start Date End Date Taking? Authorizing Provider  Dulaglutide (TRULICITY) 3 MO/7.0BE SOPN Inject 3 mg into the skin every Monday.   Yes [provider]  FARXIGA 10 MG TABS tablet Take 10 mg by mouth daily. 08/13/18  Yes [provider]  fluticasone furoate-vilanterol (BREO ELLIPTA) 200-25 MCG/ACT AEPB Inhale 1 puff into the lungs daily. 01/02/18  Yes [provider]  furosemide (LASIX) 20 MG tablet Take 20 mg by mouth daily.   Yes [provider]  glipiZIDE (GLUCOTROL) 10 MG tablet Take 10 mg by mouth 2 (two) times daily before a meal.   Yes [provider]  losartan (COZAAR) 50 MG tablet Take 50 mg by mouth daily. 10/02/20  Yes [provider]  metFORMIN (GLUCOPHAGE) 1000 MG tablet Take 1 tablet (1,000 mg total) by mouth 2 (two) times daily with a meal. 10/26/21  Yes Jettie Booze, MD  metoprolol succinate (TOPROL-XL) 50 MG 24 hr tablet Take 1 tablet (50 mg total) by mouth daily. Take with or immediately following a meal. 10/21/21  Yes Camnitz, Ocie Doyne, MD  rivaroxaban (XARELTO) 20 MG TABS tablet Take 1 tablet (20 mg total) by mouth daily with supper. 10/25/21  Yes Jettie Booze, MD  rosuvastatin (CRESTOR) 20 MG tablet Take 20 mg by mouth daily. 03/19/21  Yes [provider]  albuterol (VENTOLIN HFA) 108 (90 Base) MCG/ACT inhaler Inhale 2 puffs into the lungs every 6 (six) hours as needed for wheezing or shortness of breath.    [provider]     Critical care time: 45 min  Marqueta Pulley D. Kenton Kingfisher, NP-C [facility] Personal contact information can be found on Amion  11/13/2021, 5:25 PM

## 2021-11-13 NOTE — Progress Notes (Signed)
eLink Physician-Brief Progress Note Patient Name: [name] [name] DOB: 1956/05/06 MRN: [id number]   Date of Service  11/13/2021  HPI/Events of Note  66 yr old male s/p CABG.  Anuric on lasix drip,  metabolic acidosis.  PH 7.24.  mild resp difficulty.  Uses CPAP hs for OSA  eICU Interventions  Low rate bicarb drip Will likely need to start dialysis in am     Intervention Category Major Interventions: Acid-Base disturbance - evaluation and management  Mauri [place], P 11/13/2021, 11:05 PM

## 2021-11-13 NOTE — Progress Notes (Signed)
1 Day Post-Op Procedure(s) (LRB): CORONARY ARTERY BYPASS GRAFTING (CABG) x  3 USING LEFT INTERNAL MAMMARY ARTERY AND RIGHT GREATER SAPHENOUS VEIN (N/A) MAZE (N/A) TRANSESOPHAGEAL ECHOCARDIOGRAM (TEE) (N/A) ENDOVEIN HARVEST OF GREATER SAPHENOUS VEIN Subjective: Extubated with sats 94% but with upper back discomfort A-paced for  slow nsr Creat rising with oliguria Objective: Vital signs in last 24 hours: Temp:  [96.8 F (36 C)-97.9 F (36.6 C)] 97.2 F (36.2 C) (05/24 0715) Pulse Rate:  [86-178] 88 (05/24 0715) Resp:  [12-28] 28 (05/24 0715) BP: (94-119)/(65-70) 94/65 (05/23 1615) SpO2:  [81 %-100 %] 96 % (05/24 0806) Arterial Line BP: (90-129)/(50-81) 126/63 (05/24 0715) FiO2 (%):  [40 %-50 %] 40 % (05/23 2002) Weight:  [134.1 kg] 134.1 kg (05/24 0222)  Hemodynamic parameters for last 24 hours: PAP: (34-64)/(19-34) 64/28 CO:  [4.5 L/min-6.3 L/min] 6.3 L/min CI:  [1.9 L/min/m2-2.6 L/min/m2] 2.6 L/min/m2  Intake/Output from previous day: 05/23 0701 - 05/24 0700 In: 7431.7 [I.V.:4971.3; Blood:570; IV Piggyback:1890.4] Out: 9675 [Urine:2730; Blood:1083; Chest Tube:450] Intake/Output this shift: No intake/output data recorded.       Exam    General- alert and comfortable    Neck- no JVD, no cervical adenopathy palpable, no carotid bruit   Lungs- clear without rales, wheezes. No air leak from chest tubes   Cor- regular rate and rhythm, no murmur , gallop   Abdomen- obese, mildly distended but non-tender   Extremities - warm, non-tender, minimal edema   Neuro- oriented, appropriate, no focal weakness   Lab Results: Recent Labs    11/12/21 2057 11/13/21 0528  WBC 18.3* 22.3*  HGB 10.6* 10.6*  HCT 31.7* 31.8*  PLT 187 194   BMET:  Recent Labs    11/12/21 2057 11/13/21 0528  NA 138 136  K 4.2 4.2  CL 110 107  CO2 21* 20*  GLUCOSE 158* 155*  BUN 24* 25*  CREATININE 1.45* 1.87*  CALCIUM 7.0* 7.1*    PT/INR:  Recent Labs    11/12/21 1504  LABPROT 17.3*   INR 1.4*   ABG    Component Value Date/Time   PHART 7.345 (L) 11/12/2021 2029   HCO3 22.0 11/12/2021 2029   TCO2 23 11/12/2021 2029   ACIDBASEDEF 3.0 (H) 11/12/2021 2029   O2SAT 58.3 11/13/2021 0528   CBG (last 3)  Recent Labs    11/13/21 0519 11/13/21 0630 11/13/21 0734  GLUCAP 139* 146* 152*    Assessment/Plan: S/P Procedure(s) (LRB): CORONARY ARTERY BYPASS GRAFTING (CABG) x  3 USING LEFT INTERNAL MAMMARY ARTERY AND RIGHT GREATER SAPHENOUS VEIN (N/A) MAZE (N/A) TRANSESOPHAGEAL ECHOCARDIOGRAM (TEE) (N/A) ENDOVEIN HARVEST OF GREATER SAPHENOUS VEIN Mobilize Diabetes control See progression orders Start low dose dopamine for renal dysfunction Leave chest tubes for now  LOS: 1 day    [name] [name] 11/13/2021

## 2021-11-13 NOTE — Progress Notes (Signed)
CVP 25.  Looks more lethargic on reassessment. Feels winded.  Just received 60 mg lasix IV. Start lasix gtt at 10/hr.

## 2021-11-13 NOTE — Hospital Course (Addendum)
HPI: Patient examined, images of coronary arteriogram and echocardiogram personally reviewed and discussed with patient and wife. 66 year old obese type II diabetic with atrial fibrillation, symptomatic for 6 years.  Has had 2 cardioversions without success.  He was being evaluated for EP ablation and a calcium score cardiac CT scan was performed.  This demonstrated heavy calcification with a score of 4000.  He underwent catheterization subsequently which demonstrated moderate but three-vessel coronary disease.  The patient denies symptoms of classic angina but has noted a decrease in exercise tolerance over the past couple years.  His EF is 50% by echo without valvular disease.   His A-fib is currently controlled with Toprol-XL and he takes Xarelto.  No history of stroke or mini stroke.   Patient has not been hospitalized recently for A-fib, heart failure, or COVID.  He has never had previous surgery.  He denies previous chest trauma.   Patient's last A1c was 7.4.  His weight is 285 pounds and has been stable for months.   He is on inhalers for probable asthma versus COPD and has a remote history of smoking.  Dr. Darcey Nora reviewed the patient and all relevant studies and recommended proceeding with CABG.  Hospital course:  Patient was admitted electively and taken to the operating room on 11/12/2021 at which time he underwent CABG x 3.  He tolerated procedure well and was taken to the surgical intensive care unit in stable condition. He was extubated early the evening of surgery. He was initially A paced. He was on Nor epinephrine, Milrinone, Lasix, and Vaso pressin drips. He developed a post op ileus. He was made NPO, NGT placed. He was started on Dopamine drip as creatinine worsened and he became oliguric;however, he did not tolerate this as he had hypotension and tachycardia. Advanced hear failure was consulted to assist with post op management of acute on chronic CHF. His creatinine did  worsen;it went as high as 4.61. Of note, this is likely AKI on chronic CKD (stage III).  Nephrology was consulted and CVVHD was initiated on 11/15/21. He went into a fib and was put on an Amiodarone drip.  This persisted and he was ultimately started on heparin therapy. He also had leukocytosis, his pro calcitonin was 1.83 and lactic acid 2.3. Cultures were checked and he was put on Cefepime and then Vancomycin as well. Blood cultures show no growth to date. Vancomycin was stopped. Zaroxolyn was added to assist with diuresis.  He developed post operative Ileus and was kept on a liquid diet for a few days and then once tolerated, was advanced to a heart healthy/carb modified diet  Patient was able to move bowels and this resolved without further issues. Midodrine was increased to 15 mg tid in order to wean off Vasopressin. He had a new right central line placed on 05/30 as WBC was increased and it had been in for several days. His WBC continued to increase so TDC was put on hold. His HD catheter was removed on 06/01 and pulmonary/CCM placed a new HD catheter (non tunneled). Because of continued leukocytosis, UA and UC were checked. Results showed rare bacteria on the UA but the culture showed no growth.  He was put on Ceftriaxone and because sputum  gram stain showed few yeast, rare gram positive rods, and abundant WBC (sputum culture pending), Micafungin was added on 06/02.  White blood count continued to rise to 25,000 on 11/23/2021.  Chest x-ray was satisfactory.  He developed atrial fibrillation and was  started on IV amiodarone.  Blood pressure support continued with midodrine.  Broad-spectrum antibiotics and antifungal regimen was continued for persistent leukocytosis.  He underwent DCCV again on 06/05. He had a CT of chest/abdomen/pelvis that showed asymmetric enlargement of the psoas muscle on the left containing a hypodense region (hemorrhage vs abscess), atelectasis lower lobes, small right pleural effusion  and loculated left pleural effusion, and a few scattered pulmonary nodules. As of 06/06, he was on Vancomycin, Micafungin, and Cefepime. Infectious disease was consulted. IR did a CT guided drainage of left sided retroperitoneal hematoma on 06/07. 55 cc of thick blood was removed. He was continued on Unasyn and WBC slowly decreased. Unasyn was stopped on 06/10. Last WBC on 06/13 was decreased to 11,300. Of note, there was only a small left pleural effusion so it was too small for drainage. He was back in a fib so he was put back on an Amiodarone drip. He was cardioverted again on 06/09 and is in sinus rhythm. He was put on Apixaban and transitioned to oral Amiodarone. He was waiting on a bed for transfer to [facility], which finally happened on 06/11. He did have a fib the afternoon of 06/13 and rate was reasonably controlled. He may need outpatient DCCV if does not maintain sinus rhythm.  He has been on scheduled Insulin. Per note from the diabetes coordinator, she recommended Levemir flextouch pen. Per Dr. Prescott Gum, will also restart Trulicity.  He will need to contact his PCP and arrange for follow up regarding further diabetes management and surveillance of HGA1C as soon as possible. Once outpatient HD was arranged, patient was discharged.

## 2021-11-13 NOTE — Progress Notes (Signed)
Patient ID: [name] [name], male   DOB: 01-Feb-1956, 66 y.o.   MRN: [id number] TCTS Evening Rounds:  Hemodynamics relatively stable this pm but on NE 9 mcg and milrinone 0.25 with borderline BP 106/46  CVP 22, Co-ox 74% this afternoon.  Oliguric this afternoon with creat up to 2.6 this evening. Started on lasix 10 without response and now on 15.  K+ 4.8.  Sats 95% on 5L. He is alert but increased work of breathing.  ABG 7.22, PCO2 45, PO2 74, -9 this afternoon. Received an amp of bicarb. CCM is following.  NG inserted for ileus, minimal output. Abdomen is non-tender but distended and firm.  CBC    Component Value Date/Time   WBC 28.3 (H) 11/13/2021 1613   RBC 3.28 (L) 11/13/2021 1613   HGB 10.2 (L) 11/13/2021 1613   HGB 13.4 10/21/2021 1547   HCT 31.3 (L) 11/13/2021 1613   HCT 40.8 10/21/2021 1547   PLT 192 11/13/2021 1613   PLT 246 10/21/2021 1547   MCV 95.4 11/13/2021 1613   MCV 91 10/21/2021 1547   MCH 31.1 11/13/2021 1613   MCHC 32.6 11/13/2021 1613   RDW 14.5 11/13/2021 1613   RDW 14.6 10/21/2021 1547

## 2021-11-13 NOTE — Discharge Summary (Addendum)
Physician Discharge Summary       [address]       [place] [address]             [id number]    Patient ID: [name] [name] MRN: [id number] DOB/AGE: August 02, 1955 66 y.o.  Admit date: 11/12/2021 Discharge date: 12/04/2021  Admission Diagnoses: Coronary artery disease Discharge Diagnoses:  1.  S/P CABG x 3 2. Post op ileus 3. Acute on chronic CKD (stage III) 4. Leukocytosis 5. History of the following: Atrial fibrillation ([place])      Benign essential HTN 07/30/2014   Bradycardia 01/25/2015   CHF (congestive heart failure) (HCC)     Chronic anticoagulation 32/44/0102   Chronic systolic CHF (congestive heart failure), NYHA class 3 ([place]) 08/30/2014    Overview:  Global ef 30%   CKD (chronic kidney disease)     Class 3 severe obesity in adult [facility]) 04/28/2017   Coronary artery disease     Diabetes mellitus without complication ([place])     Diabetic neuropathy ([place]) 07/28/2014   Dysrhythmia     Erectile dysfunction     High risk medication use 05/26/2018   Hypertension     Hypertensive heart disease with heart failure ([place]) 07/30/2014   Lumbago     LV dysfunction 04/28/2017   Mixed hyperlipidemia 07/28/2014   OSA (obstructive sleep apnea)      CPAP   Paroxysmal atrial fibrillation ([place]) 07/28/2014   Sinus node dysfunction ([place]) 04/21/2016   Testicular hypofunction     Uncontrolled type 2 diabetes mellitus with microalbuminuric diabetic nephropathy 07/28/2014  History of remote tobacco abuse   Consults:  Advanced heart failure , Nephrology  Procedure (s):  TEE/DCCV by Dr. Aundra Dubin on 11/21/2021 and 11/25/2021 and 11/29/2021.  Placement of non tunneled HD catheter 11/21/2021  1.  Coronary artery bypass grafting x3 (left internal mammary artery to LAD, saphenous vein graft to OM1, saphenous vein graft to posterior descending). 2.  Left atrial maze procedure with radiofrequency ablation to bilateral pulmonary vein isolation. 3.  Endoscopic harvest of right  greater saphenous vein by Dr. Prescott Gum on 11/12/2021.   HPI: Patient examined, images of coronary arteriogram and echocardiogram personally reviewed and discussed with patient and wife. 46 year old obese type II diabetic with atrial fibrillation, symptomatic for 6 years.  Has had 2 cardioversions without success.  He was being evaluated for EP ablation and a calcium score cardiac CT scan was performed.  This demonstrated heavy calcification with a score of 4000.  He underwent catheterization subsequently which demonstrated moderate but three-vessel coronary disease.  The patient denies symptoms of classic angina but has noted a decrease in exercise tolerance over the past couple years.  His EF is 50% by echo without valvular disease.   His A-fib is currently controlled with Toprol-XL and he takes Xarelto.  No history of stroke or mini stroke.   Patient has not been hospitalized recently for A-fib, heart failure, or COVID.  He has never had previous surgery.  He denies previous chest trauma.   Patient's last A1c was 7.4.  His weight is 285 pounds and has been stable for months.   He is on inhalers for probable asthma versus COPD and has a remote history of smoking.  Dr. Darcey Nora reviewed the patient and all relevant studies and recommended proceeding with CABG.  Hospital course: Patient was admitted electively and taken to the operating room on 11/12/2021 at which time he underwent CABG x 3.  He tolerated procedure well and  was taken to the surgical intensive care unit in stable condition. He was extubated early the evening of surgery. He was initially A paced. He was on Nor epinephrine, Milrinone, Lasix, and Vaso pressin drips. He developed a post op ileus. He was made NPO, NGT placed. He was started on Dopamine drip as creatinine worsened and he became oliguric;however, he did not tolerate this as he had hypotension and tachycardia. Advanced hear failure was consulted to assist with post op  management of acute on chronic CHF. His creatinine did worsen;it went as high as 4.61. Of note, this is likely AKI on chronic CKD (stage III).  Nephrology was consulted and CVVHD was initiated on 11/15/21. He went into a fib and was put on an Amiodarone drip.  This persisted and he was ultimately started on heparin therapy. He also had leukocytosis, his pro calcitonin was 1.83 and lactic acid 2.3. Cultures were checked and he was put on Cefepime and then Vancomycin as well. Blood cultures show no growth to date. Vancomycin was stopped. Zaroxolyn was added to assist with diuresis.  He developed post operative Ileus and was kept on a liquid diet for a few days and then once tolerated, was advanced to a heart healthy/carb modified diet  Patient was able to move bowels and this resolved without further issues. Midodrine was increased to 15 mg tid in order to wean off Vasopressin. He had a new right central line placed on 05/30 as WBC was increased and it had been in for several days. His WBC continued to increase so TDC was put on hold. His HD catheter was removed on 06/01 and pulmonary/CCM placed a new HD catheter (non tunneled). Because of continued leukocytosis, UA and UC were checked. Results showed rare bacteria on the UA but the culture showed no growth.  He was put on Ceftriaxone and because sputum  gram stain showed few yeast, rare gram positive rods, and abundant WBC (sputum culture pending), Micafungin was added on 06/02.  White blood count continued to rise to 25,000 on 11/23/2021.  Chest x-ray was satisfactory.  He developed atrial fibrillation and was started on IV amiodarone.  Blood pressure support continued with midodrine.  Broad-spectrum antibiotics and antifungal regimen was continued for persistent leukocytosis.  He underwent DCCV again on 06/05. He had a CT of chest/abdomen/pelvis that showed asymmetric enlargement of the psoas muscle on the left containing a hypodense region (hemorrhage vs abscess),  atelectasis lower lobes, small right pleural effusion and loculated left pleural effusion, and a few scattered pulmonary nodules. As of 06/06, he was on Vancomycin, Micafungin, and Cefepime. Infectious disease was consulted. IR did a CT guided drainage of left sided retroperitoneal hematoma on 06/07. 55 cc of thick blood was removed. He was continued on Unasyn and WBC slowly decreased. Unasyn was stopped on 06/10. Last WBC on 06/13 was decreased to 11,300. Of note, there was only a small left pleural effusion so it was too small for drainage. He was back in a fib so he was put back on an Amiodarone drip. He was cardioverted again on 06/09 and is in sinus rhythm. He was put on Apixaban and transitioned to oral Amiodarone. He was waiting on a bed for [facility] to [facility], which finally happened on 06/11. He did have a fib the afternoon of 06/13 and rate was reasonably controlled. He may need outpatient DCCV if does not maintain sinus rhythm.  He has been on scheduled Insulin. Per note from the diabetes coordinator, she recommended  Levemir flextouch pen. Per Dr. Prescott Gum, will also restart Trulicity.  He will need to contact his PCP and arrange for follow up regarding further diabetes management and surveillance of HGA1C as soon as possible. Outpatient HD was arranged and the first date available was Friday 12/06/2021. As a result, patient remained in the hospital so that he could have inpatient HD on Wednesday 06/14. Provided vital signs remain stable, patient will discharged after HD.      Latest Vital Signs: Blood pressure 119/65, pulse 88, temperature 98 F (36.7 C), temperature source Oral, resp. rate 15, height 5\' 10"  (1.778 m), weight 120.9 kg, SpO2 99 %.  Physical Exam: Cardiovascular: RRR Pulmonary: Clear to auscultation bilaterally Abdomen: Soft, non tender, bowel sounds present. Extremities: Mild bilateral lower extremity edema. Wounds: Clean and dry.  No erythema or signs of infection.     Discharge Condition:Stable and discharged to home.  Recent laboratory studies:  Lab Results  Component Value Date   WBC 11.3 (H) 12/03/2021   HGB 7.7 (L) 12/03/2021   HCT 24.1 (L) 12/03/2021   MCV 90.9 12/03/2021   PLT 320 12/03/2021   Lab Results  Component Value Date   NA 134 (L) 12/04/2021   K 3.6 12/04/2021   CL 95 (L) 12/04/2021   CO2 24 12/04/2021   CREATININE 6.88 (H) 12/04/2021   GLUCOSE 164 (H) 12/04/2021      Diagnostic Studies: DG Chest Port 1 View  Result Date: 12/01/2021 CLINICAL DATA:  Status post CABG. EXAM: PORTABLE CHEST 1 VIEW COMPARISON:  11/29/2021 FINDINGS: The cardio pericardial silhouette is enlarged. Similar appearance patchy airspace disease at the left base. Right IJ central line tip overlies the right atrium. Left IJ central line is been removed in the interval. Telemetry leads overlie the chest. IMPRESSION: 1. Interval removal of left IJ central line. 2. Similar appearance of patchy airspace disease at the left base. Electronically Signed   By: Misty Stanley M.D.   On: 12/01/2021 09:53   DG Chest Port 1 View  Result Date: 11/29/2021 CLINICAL DATA:  Reason for exam: sob Patient denies any sob or chest pains. Reports to have cardioversion today. Hx of cardioversion 11/25/2021, CABG x3 11/12/2021. EXAM: PORTABLE CHEST - 1 VIEW COMPARISON:  the previous day's study FINDINGS: Tunneled right IJ hemodialysis catheter extends to the proximal RA. Non tunneled left IJ catheter is directed towards the lateral wall of the proximal SVC. No pneumothorax. Persistent patchy airspace opacities in the mid and lower left lung. Right lung clear. Heart size upper limits normal. CABG graft markers. Aortic Atherosclerosis (ICD10-170.0). No effusion. Sternotomy wires. IMPRESSION: Persistent patchy left mid and lower lung airspace opacities. Electronically Signed   By: Lucrezia Europe M.D.   On: 11/29/2021 08:16   IR Fluoro Guide CV Line Right  Result Date: 11/29/2021 CLINICAL DATA:   Renal failure, needs durable venous access for hemodialysis EXAM: TUNNELED HEMODIALYSIS CATHETER PLACEMENT WITH ULTRASOUND AND FLUOROSCOPIC GUIDANCE TECHNIQUE: The procedure, risks, benefits, and alternatives were explained to the patient. Questions regarding the procedure were encouraged and answered. The patient understands and consents to the procedure. As antibiotic prophylaxis, cefazolin 2 g was ordered pre-procedure and administered intravenously within one hour of incision.Patency of the right IJ vein was confirmed with ultrasound with image documentation. An appropriate skin site was determined. Region was prepped using maximum barrier technique including cap and mask, sterile gown, sterile gloves, large sterile sheet, and Chlorhexidine as cutaneous antisepsis. The region was infiltrated locally with 1% lidocaine. sedationUnder real-time  ultrasound guidance, the right IJ vein was accessed with a 21 gauge micropuncture needle; the needle tip within the vein was confirmed with ultrasound image documentation. Needle exchanged over the 018 guidewire for transitional dilator, which allowed advancement of a Benson wire into the IVC. Over this, an MPA catheter was advanced. A Palindrome 23 hemodialysis catheter was tunneled from the right anterior chest wall approach to the right IJ dermatotomy site. The MPA catheter was exchanged over an Amplatz wire for serial vascular dilators which allow placement of a peel-away sheath, through which the catheter was advanced under intermittent fluoroscopy, positioned with its tips in the proximal and midright atrium. Spot chest radiograph confirms good catheter position. No pneumothorax. Catheter was flushed and primed per protocol. Catheter secured externally with O Prolene sutures. The right IJ dermatotomy site was closed with Dermabond. COMPLICATIONS: COMPLICATIONS None immediate FLUOROSCOPY: Radiation Exposure Index (as provided by the fluoroscopic device): 25 mGy air  [place] COMPARISON:  None Available. IMPRESSION: 1. Technically successful placement of tunneled right IJ hemodialysis catheter with ultrasound and fluoroscopic guidance. Ready for routine use. ACCESS: Remains approachable for percutaneous intervention as needed. Electronically Signed   By: Lucrezia Europe M.D.   On: 11/29/2021 08:07   IR US Guide Vasc Access Right  Result Date: 11/29/2021 CLINICAL DATA:  Renal failure, needs durable venous access for hemodialysis EXAM: TUNNELED HEMODIALYSIS CATHETER PLACEMENT WITH ULTRASOUND AND FLUOROSCOPIC GUIDANCE TECHNIQUE: The procedure, risks, benefits, and alternatives were explained to the patient. Questions regarding the procedure were encouraged and answered. The patient understands and consents to the procedure. As antibiotic prophylaxis, cefazolin 2 g was ordered pre-procedure and administered intravenously within one hour of incision.Patency of the right IJ vein was confirmed with ultrasound with image documentation. An appropriate skin site was determined. Region was prepped using maximum barrier technique including cap and mask, sterile gown, sterile gloves, large sterile sheet, and Chlorhexidine as cutaneous antisepsis. The region was infiltrated locally with 1% lidocaine. sedationUnder real-time ultrasound guidance, the right IJ vein was accessed with a 21 gauge micropuncture needle; the needle tip within the vein was confirmed with ultrasound image documentation. Needle exchanged over the 018 guidewire for transitional dilator, which allowed advancement of a Benson wire into the IVC. Over this, an MPA catheter was advanced. A Palindrome 23 hemodialysis catheter was tunneled from the right anterior chest wall approach to the right IJ dermatotomy site. The MPA catheter was exchanged over an Amplatz wire for serial vascular dilators which allow placement of a peel-away sheath, through which the catheter was advanced under intermittent fluoroscopy, positioned with its  tips in the proximal and midright atrium. Spot chest radiograph confirms good catheter position. No pneumothorax. Catheter was flushed and primed per protocol. Catheter secured externally with O Prolene sutures. The right IJ dermatotomy site was closed with Dermabond. COMPLICATIONS: COMPLICATIONS None immediate FLUOROSCOPY: Radiation Exposure Index (as provided by the fluoroscopic device): 25 mGy air [place] COMPARISON:  None Available. IMPRESSION: 1. Technically successful placement of tunneled right IJ hemodialysis catheter with ultrasound and fluoroscopic guidance. Ready for routine use. ACCESS: Remains approachable for percutaneous intervention as needed. Electronically Signed   By: Lucrezia Europe M.D.   On: 11/29/2021 08:07   DG Chest Port 1 View  Result Date: 11/28/2021 CLINICAL DATA:  History of CABG. EXAM: PORTABLE CHEST 1 VIEW COMPARISON:  AP chest 11/26/2021 FINDINGS: Status post median sternotomy and CABG. Cardiac silhouette is again at least moderately enlarged. Mediastinal contours are within normal limits. Calcification is again seen within the aortic  arch. Interval removal of right upper extremity PICC. Redemonstration of left internal jugular central venous catheter sheath with associated catheter tip overlying the superior vena cava, unchanged. Mild left interstitial thickening is similar to prior. Left basilar heterogeneous opacification again likely corresponding to the loculated left posterior pleural effusion and associated atelectasis better seen on prior CT. No pneumothorax. Mild multilevel degenerative disc changes of the thoracic spine. IMPRESSION: 1. Interval removal of right upper extremity PICC. 2. Stable enlarged cardiac silhouette. 3. Mild left interstitial thickening and basilar heterogeneous opacification are not significantly changed, corresponding to the loculated left basilar pleural effusion better seen on prior CT. Electronically Signed   By: Yvonne Kendall M.D.   On: 11/28/2021  08:22   CT ASPIRATION  Result Date: 11/27/2021 INDICATION: Recent CABG, now with sepsis of uncertain etiology. Please perform image guided aspiration and/or drainage catheter placement of left-sided retroperitoneal hematoma given concern for superinfection. Additionally, please perform image guided thoracentesis as indicated. EXAM: CT-GUIDED LEFT-SIDED RETROPERITONEAL HEMATOMA ASPIRATION COMPARISON:  CT abdomen and pelvis-11/25/2021 MEDICATIONS: The patient is currently admitted to the hospital and receiving intravenous antibiotics. The antibiotics were administered within an appropriate time frame prior to the initiation of the procedure. ANESTHESIA/SEDATION: Moderate (conscious) sedation was employed during this procedure. A total of Versed 1 mg and Fentanyl 50 mcg was administered intravenously. Moderate Sedation Time: 18 minutes. The patient's level of consciousness and vital signs were monitored continuously by radiology nursing throughout the procedure under my direct supervision. CONTRAST:  None COMPLICATIONS: None immediate. PROCEDURE: RADIATION DOSE REDUCTION: This exam was performed according to the departmental dose-optimization program which includes automated exposure control, adjustment of the mA and/or kV according to patient size and/or use of iterative reconstruction technique. Informed written consent was obtained from the patient after a discussion of the risks, benefits and alternatives to treatment. The patient was placed supine, slightly RPO on the CT gantry and a pre procedural CT was performed re-demonstrating the known left-sided retroperitoneal hematoma with dominant component measuring approximately 6.6 x 5.9 cm (image 60, series 2). Dedicated imaging of the lower thorax demonstrates a unchanged small/trace left-sided pleural effusion, too small to allow for safe ultrasound-guided thoracentesis. The procedure was planned. A timeout was performed prior to the initiation of the procedure.  The skin overlying the lateral aspect of the left mid abdomen was prepped and draped in the usual sterile fashion. The overlying soft tissues were anesthetized with 1% lidocaine with epinephrine. Appropriate trajectory was planned with the use of a 22 gauge spinal needle. An 18 gauge trocar needle was advanced into the abscess/fluid collection and a short Amplatz super stiff wire was coiled within the collection. Appropriate positioning was confirmed with a limited CT scan. The tract was serially dilated allowing placement of a 10 [place] all-purpose drainage catheter. Imaging was performed demonstrating the drainage catheter coiled within the posterolateral aspect of the left-sided retroperitoneal hematoma. From this location, approximally 30 cc of thick, bloody fluid was aspirated and a drainage catheter was removed. Given residual amount of hematoma, the more central and more hypoattenuating and thus potentially liquified component of the hematoma was targeted with an 18 gauge trocar needle. A short Amplatz wire was coiled within the more central aspect of the collection and a 10 French percutaneous drainage catheter was coiled and locked within the collection. An additional approximately 25 cc of similar appearing thick, bloody fluid was aspirated from the left-sided retroperitoneal hematoma. The external portion of the drainage catheter was cut and drainage catheter was removed  intact. A dressing was applied. The patient tolerated the procedure well without immediate postprocedural complication. IMPRESSION: 1. Successful CT guided aspiration of a total of 55 cc of thick, bloody fluid from left-sided retroperitoneal hematoma. As the collection is favored to represent a sterile non infected hematoma, a drainage catheter was not left in place. All aspirated fluid was capped and sent to the laboratory for analysis. 2. Small/trace partially loculated left-sided pleural effusion, similar to the 11/25/2021 examination  and too small to allow for safe image guided thoracentesis. Electronically Signed   By: Sandi Mariscal M.D.   On: 11/27/2021 14:30   DG Chest Port 1 View  Result Date: 11/26/2021 CLINICAL DATA:  Postoperative CABG EXAM: PORTABLE CHEST 1 VIEW COMPARISON:  11/25/2021, 11/23/2021 FINDINGS: Right-sided PICC line and left IJ central venous catheter are stable in positioning. Prior sternotomy and CABG. Stable cardiomegaly. Aortic atherosclerosis. Left basilar opacity, increased from 11/23/2021. Loculated left-sided pleural effusion, better seen on recent CT. Right lung remains clear. No pneumothorax. IMPRESSION: Persistent left basilar opacity with loculated left-sided pleural effusion, better seen on recent CT. Electronically Signed   By: Davina Poke D.O.   On: 11/26/2021 08:45   CT CHEST ABDOMEN PELVIS WO CONTRAST  Result Date: 11/25/2021 CLINICAL DATA:  Sepsis, elevated WBC. EXAM: CT CHEST, ABDOMEN AND PELVIS WITHOUT CONTRAST TECHNIQUE: Multidetector CT imaging of the chest, abdomen and pelvis was performed following the standard protocol without IV contrast. RADIATION DOSE REDUCTION: This exam was performed according to the departmental dose-optimization program which includes automated exposure control, adjustment of the mA and/or kV according to patient size and/or use of iterative reconstruction technique. COMPARISON:  None Available. FINDINGS: CT CHEST FINDINGS Cardiovascular: Heart is normal in size and there is a hyperdense pericardial effusion measuring up to 1.3 cm in thickness. Three-vessel coronary artery calcifications are noted. There is atherosclerotic calcification of the aorta without evidence of aneurysm. Pulmonary trunk is mildly distended which may be associated with underlying pulmonary artery hypertension. The distal tip of a central venous catheter terminates in the superior vena cava. Mediastinum/Nodes: No mediastinal or axillary lymphadenopathy by size criteria. Evaluation of the hila is  limited due to lack of IV contrast. The thyroid gland and esophagus are within normal limits. A small amount of debris is noted in the trachea. Lungs/Pleura: Mild atelectasis or infiltrate is noted in the lower lobes bilaterally. There is a small pleural effusion on the right and a moderate loculated pleural effusion on the left. No pneumothorax. A few scattered pulmonary nodules are noted on the right measuring up to 3 mm, axial image 65. Musculoskeletal: Sternotomy wires are noted in the midline. No acute or suspicious osseous abnormality. CT ABDOMEN PELVIS FINDINGS Hepatobiliary: Subcentimeter hypodensity is present in the anterior right lobe of the liver which is too small to further characterize. The gallbladder is without stones. No biliary ductal dilatation. Pancreas: Unremarkable. No pancreatic ductal dilatation or surrounding inflammatory changes. Spleen: Normal in size without focal abnormality. Adrenals/Urinary Tract: The adrenal glands are within normal limits. A nonobstructive calculus is noted on the left. No ureteral calculus or obstructive uropathy bilaterally. The bladder is unremarkable. Stomach/Bowel: Stomach is within normal limits. Appendix appears normal. No evidence of bowel wall thickening, distention, or inflammatory changes. No free air or pneumatosis. A moderate amount of retained stool is noted in the colon. Vascular/Lymphatic: Aortic atherosclerosis. No enlarged abdominal or pelvic lymph nodes. Reproductive: Prostate is unremarkable. Other: Fat containing inguinal hernias bilaterally. Small fat containing umbilical hernia. Musculoskeletal: There is asymmetric  enlargement of the psoas muscle on the left with a hypodense region with a hyperdense rim measuring 8.3 x 4.2 x 3.5 cm. There is surrounding hyperdense material in the retroperitoneum on the left. Degenerative changes are noted in the lumbar spine. No acute osseous abnormality. IMPRESSION: 1. Asymmetric enlargement of the psoas  muscle on the left containing a hypodense region with surrounding hyperdense material within the muscle and in the retroperitoneum on the left, possible hemorrhage versus abscess. 2. Atelectasis or infiltrate in the lower lobes bilaterally. 3. Small right pleural effusion and moderate loculated pleural effusion on the left. 4. Small hyperdense pericardial effusion, possibly reflecting blood products versus infection. 5. A few scattered pulmonary nodules bilaterally measuring up to 3 mm. No follow-up needed if patient is low-risk (and has no known or suspected primary neoplasm). Non-contrast chest CT can be considered in 12 months if patient is high-risk. This recommendation follows the consensus statement: Guidelines for Management of Incidental Pulmonary Nodules Detected on CT Images: From the Fleischner Society 2017; Radiology 2017; 284:228-243. 6. Aortic atherosclerosis. 7. Nonobstructive left renal calculus. Electronically Signed   By: Brett Fairy M.D.   On: 11/25/2021 21:55   DG Chest Port 1 View  Result Date: 11/23/2021 CLINICAL DATA:  CABG EXAM: PORTABLE CHEST 1 VIEW COMPARISON:  11/22/2021 FINDINGS: Right-sided PICC line and left IJ central venous catheter are stable in positioning. Prior sternotomy and CABG. Stable cardiomegaly. Previously seen left suprahilar opacity is improving compared to prior. No new focal airspace consolidation. No large pleural fluid collection. No pneumothorax. IMPRESSION: Improving left suprahilar opacity.  Otherwise stable chest. Electronically Signed   By: Davina Poke D.O.   On: 11/23/2021 09:15   DG CHEST PORT 1 VIEW  Result Date: 11/22/2021 CLINICAL DATA:  Status post thoracentesis. EXAM: PORTABLE CHEST 1 VIEW COMPARISON:  November 22, 2021. FINDINGS: Stable cardiomegaly. Status post coronary bypass graft. Left internal jugular and right-sided PICC line are unchanged in position. No pneumothorax is noted. No significant pleural effusion is noted. Mild left suprahilar  opacity is noted concerning for atelectasis or possibly infiltrate. Bony thorax is unremarkable. IMPRESSION: Mild left suprahilar opacity is noted concerning for atelectasis or possibly infiltrate. Electronically Signed   By: Marijo Conception M.D.   On: 11/22/2021 14:01   DG CHEST PORT 1 VIEW  Result Date: 11/22/2021 CLINICAL DATA:  Pleural effusion. EXAM: PORTABLE CHEST 1 VIEW COMPARISON:  November 21, 2021. FINDINGS: Stable cardiomegaly. Right-sided PICC line and left internal jugular catheter unchanged in position. Right lung is clear. Stable left basilar opacity is noted suggesting atelectasis and probable associated pleural effusion. Bony thorax is unremarkable. IMPRESSION: Stable left basilar opacity is noted suggesting atelectasis and probable associated pleural effusion. Electronically Signed   By: Marijo Conception M.D.   On: 11/22/2021 07:59   DG CHEST PORT 1 VIEW  Result Date: 11/21/2021 CLINICAL DATA:  Shortness of breath chest pain. EXAM: PORTABLE CHEST 1 VIEW COMPARISON:  Chest radiograph performed earlier on the same day FINDINGS: The heart is enlarged. Evidence of prior coronary artery bypass grafting. Left IJ access catheter with distal tip in the SVC. Right access PICC with distal tip in the SVC. Mild elevation of the left hemidiaphragm with left basilar opacity which may represent small effusion/atelectasis, unchanged. IMPRESSION: Interval placement of left IJ access central line. Stable cardiomegaly with left basilar opacity suggesting effusion/atelectasis, unchanged. Electronically Signed   By: Keane Police D.O.   On: 11/21/2021 16:32   ECHO TEE  Result Date: 11/21/2021  TRANSESOPHOGEAL ECHO REPORT   Patient Name:   [name] [name] Date of Exam: 11/21/2021 Medical Rec #:  [id number]    Height:       70.0 in Accession #:    [id number]   Weight:       257.6 lb Date of Birth:  16-Jul-1955   BSA:          2.324 m Patient Age:    65 years     BP:           97/60 mmHg Patient Gender: M            HR:            83 bpm. Exam Location:  Inpatient Procedure: Transesophageal Echo, Color Doppler and Saline Contrast Bubble Study Indications:     I48.91* Unspeicified atrial fibrillation  History:         Patient has prior history of Echocardiogram examinations, most                  recent 11/13/2021. CHF, CAD, Arrythmias:Bradycardia and Atrial                  Fibrillation; Risk Factors:Dyslipidemia, Hypertension and                  Diabetes.  Sonographer:     Bernadene Person RDCS Referring Phys:  [place] Diagnosing Phys: Franki Monte PROCEDURE: After discussion of the risks and benefits of a TEE, an informed consent was obtained from the patient. The transesophogeal probe was passed without difficulty through the esophogus of the patient. Sedation performed by different physician. The patient was monitored while under deep sedation. Anesthestetic sedation was provided intravenously by Anesthesiology: 56.28mg  of Propofol. The patient's vital signs; including heart rate, blood pressure, and oxygen saturation; remained stable throughout the procedure. The patient developed no complications during the procedure. A successful direct current cardioversion was performed at 200 joules with 1 attempt. IMPRESSIONS  1. Left ventricular ejection fraction, by estimation, is 55 to 60%. The left ventricle has normal function. The left ventricle has no regional wall motion abnormalities but there is septal bounce suggestive of prior cardiac surgery.  2. Right ventricular systolic function is mildly reduced. The right ventricular size is normal. Tricuspid regurgitation signal is inadequate for assessing PA pressure.  3. Left atrial size was mildly dilated. No left atrial/left atrial appendage thrombus was detected.  4. There is a PFO by color doppler and bubble study.  5. The mitral valve is normal in structure. Mild mitral valve regurgitation. No evidence of mitral stenosis.  6. The aortic valve is tricuspid. There is  mild calcification of the aortic valve. Aortic valve regurgitation is not visualized. No aortic stenosis is present.  7. A small pericardial effusion is present, primarily towards the apex. FINDINGS  Left Ventricle: Left ventricular ejection fraction, by estimation, is 55 to 60%. The left ventricle has normal function. The left ventricle has no regional wall motion abnormalities. The left ventricular internal cavity size was normal in size. There is  no left ventricular hypertrophy. Right Ventricle: The right ventricular size is normal. No increase in right ventricular wall thickness. Right ventricular systolic function is mildly reduced. Tricuspid regurgitation signal is inadequate for assessing PA pressure. Left Atrium: Left atrial size was mildly dilated. No left atrial/left atrial appendage thrombus was detected. Right Atrium: Right atrial size was normal in size. Pericardium: A small pericardial effusion is present. Mitral Valve: The mitral valve is  normal in structure. Mild mitral valve regurgitation. No evidence of mitral valve stenosis. Tricuspid Valve: The tricuspid valve is normal in structure. Tricuspid valve regurgitation is trivial. Aortic Valve: The aortic valve is tricuspid. There is mild calcification of the aortic valve. Aortic valve regurgitation is not visualized. No aortic stenosis is present. Pulmonic Valve: The pulmonic valve was normal in structure. Pulmonic valve regurgitation is not visualized. Aorta: The aortic root is normal in size and structure. IAS/Shunts: There is a PFO by color doppler and bubble study. Agitated saline contrast was given intravenously to evaluate for intracardiac shunting. [employer] Electronically signed by Franki Monte Signature Date/Time: 11/21/2021/3:32:50 PM    Final    DG CHEST PORT 1 VIEW  Result Date: 11/21/2021 CLINICAL DATA:  Pleural effusion EXAM: PORTABLE CHEST 1 VIEW COMPARISON:  11/20/2021 FINDINGS: Left IJ introducer has been removed. Right  subclavian line is unchanged. Lung aeration is similar with persistent left upper and lower lung opacities. Probable small left pleural effusion remains present. Stable cardiomegaly. IMPRESSION: Lines and tubes as above. Persistent left upper and lower lung opacities and probable small left pleural effusion. Electronically Signed   By: Macy Mis M.D.   On: 11/21/2021 08:06   DG Chest Port 1 View  Result Date: 11/20/2021 CLINICAL DATA:  Post CABG EXAM: PORTABLE CHEST 1 VIEW COMPARISON:  11/19/2021 FINDINGS: Left IJ introducer now overlies the distal left IJ. Right subclavian line is unchanged. Persistent left basilar and suprahilar opacities. Stable cardiomegaly. No pneumothorax. Small left effusion. IMPRESSION: No significant change. Persistent left basilar in suprahilar opacities and small left effusion. Electronically Signed   By: Macy Mis M.D.   On: 11/20/2021 08:13   DG CHEST PORT 1 VIEW  Result Date: 11/19/2021 CLINICAL DATA:  Central line placement EXAM: PORTABLE CHEST 1 VIEW COMPARISON:  11/19/2021 FINDINGS: Interval placement of right subclavian central venous catheter with the tip at the cavoatrial junction. No pneumothorax Pre-existing right jugular sheath unchanged in position in the right innominate vein. Left jugular central venous catheter tip in the proximal SVC also unchanged Postop CABG with cardiac enlargement. Asymmetric airspace disease left upper lobe and left lower lobe unchanged. Small left effusion. Right lung remains clear. IMPRESSION: Right subclavian central venous catheter tip at the cavoatrial junction. No pneumothorax Asymmetric airspace disease on the left is unchanged. Electronically Signed   By: Franchot Gallo M.D.   On: 11/19/2021 11:39   DG CHEST PORT 1 VIEW  Result Date: 11/19/2021 CLINICAL DATA:  Pneumonia, history of CHF, CABG on 11/12/2021 EXAM: PORTABLE CHEST 1 VIEW COMPARISON:  Chest radiograph 11/18/2021 FINDINGS: The right IJ vascular sheath and left  IJ vascular catheter are in stable position terminating in the region of the right brachiocephalic vein and confluence of the brachiocephalic veins, respectively. Median sternotomy wires are unchanged. The cardiac silhouette is enlarged, unchanged. The upper mediastinal contours are stable. Retrocardiac and patchy left suprahilar opacities are not significantly changed. There are probable trace bilateral pleural effusions. There is no overt pulmonary edema. There is no new or worsening focal airspace disease. Overall, aeration is not significantly changed. There is no pneumothorax. There is no acute osseous abnormality. IMPRESSION: Unchanged retrocardiac and left perihilar opacities and probable trace bilateral pleural effusions. Overall, no significant interval change since 11/18/2021. Electronically Signed   By: Valetta Mole M.D.   On: 11/19/2021 07:59   DG Chest Port 1 View  Result Date: 11/18/2021 CLINICAL DATA:  ETT EXAM: PORTABLE CHEST - 1 VIEW COMPARISON:  11/16/2021 FINDINGS: Unchanged  cardiomegaly. Interval increase of left perihilar airspace opacity. No significant pulmonary vascular congestion. Postsurgical changes of CABG again seen. Left IJ central venous catheter unchanged in position terminating at the confluence of the brachiocephalic veins. Right IJ sheath terminates in the region of the right brachiocephalic vein. IMPRESSION: 1. Interval worsening of left perihilar airspace opacity suspicious for developing pneumonia. 2. Unchanged cardiomegaly. Electronically Signed   By: Miachel Roux M.D.   On: 11/18/2021 07:34   DG Chest Port 1 View  Result Date: 11/16/2021 CLINICAL DATA:  Status post CABG. EXAM: PORTABLE CHEST 1 VIEW COMPARISON:  11/15/2021 FINDINGS: Right IJ Cordis is stable with tip projecting over the proximal SVC. There is a left IJ catheter with tip projecting over the mid SVC. Postoperative changes from median sternotomy and CABG procedure. Stable cardiac enlargement with pulmonary  vascular congestion. No pneumothorax or airspace consolidation. IMPRESSION: Stable cardiac enlargement and pulmonary vascular congestion. Electronically Signed   By: Kerby Moors M.D.   On: 11/16/2021 09:54   DG Chest Port 1 View  Result Date: 11/15/2021 CLINICAL DATA:  Central line placement EXAM: PORTABLE CHEST 1 VIEW COMPARISON:  11/15/2021 FINDINGS: Single frontal view of the chest demonstrates interval removal of the enteric catheter. Right internal jugular Cordis is stable. Interval placement of a left internal jugular catheter tip overlying the superior vena cava. Postsurgical changes from prior CABG. Cardiac silhouette remains enlarged, with stable central vascular congestion. Lung volumes are diminished, without focal airspace disease, effusion, or pneumothorax. No acute bony abnormalities. IMPRESSION: 1. No complication after left internal jugular catheter placement. 2. Continued low lung volumes and central vascular congestion. Electronically Signed   By: Randa Ngo M.D.   On: 11/15/2021 16:28   DG Abd 2 Views  Result Date: 11/15/2021 CLINICAL DATA:  Ileus, abdominal distension EXAM: ABDOMEN - 2 VIEW COMPARISON:  Radiograph 11/14/2021 FINDINGS: Nasogastric tube tip and side port overlie the stomach. Slight improvement in gaseous distension of bowel small measures up to 3.0 cm, previously up to 3.5 cm. No evidence of free intraperitoneal gas on upright x-ray. IMPRESSION: Slight improvement in bowel distention in comparison to prior exam. Electronically Signed   By: Maurine Simmering M.D.   On: 11/15/2021 10:43   DG CHEST PORT 1 VIEW  Result Date: 11/15/2021 CLINICAL DATA:  Pneumothorax. EXAM: PORTABLE CHEST 1 VIEW COMPARISON:  Nov 14, 2021. FINDINGS: Stable cardiomegaly. Status post coronary bypass graft. Nasogastric tube is seen entering stomach. Right internal jugular venous sheath is noted. Left-sided chest tube has been removed without pneumothorax. Bibasilar atelectasis is noted. Bony  thorax is unremarkable. IMPRESSION: Left-sided chest tube has been removed without pneumothorax. Stable bibasilar atelectasis. Electronically Signed   By: Marijo Conception M.D.   On: 11/15/2021 08:48   ECHO INTRAOPERATIVE TEE  Result Date: 11/14/2021  *INTRAOPERATIVE TRANSESOPHAGEAL REPORT *  Patient Name:   [name] [name]   Date of Exam: 11/12/2021 Medical Rec #:  [id number]      Height:       70.0 in Accession #:    [id number]     Weight:       280.2 lb Date of Birth:  01-12-1956     BSA:          2.41 m Patient Age:    83 years       BP:           125/72 mmHg Patient Gender: M              HR:  112 bpm. Exam Location:  Anesthesiology Transesophogeal exam was perform intraoperatively during surgical procedure. Patient was closely monitored under general anesthesia during the entirety of examination. Indications:     Coronary Artery Disease Sonographer:     Bernadene Person RDCS Performing Phys: [place] Jimie [place] Diagnosing Phys: Albertha Ghee MD Complications: No known complications during this procedure. POST-OP IMPRESSIONS Overall, there were no significant changes from pre-bypass. PRE-OP FINDINGS  Left Ventricle: The left ventricle has mildly reduced systolic function, with an ejection fraction of 45-50%. The cavity size was normal. There is no increase in left ventricular wall thickness. Left ventrical global hypokinesis without regional wall motion abnormalities. There is no left ventricular hypertrophy. Right Ventricle: The right ventricle has normal systolic function. The cavity was normal. There is no increase in right ventricular wall thickness. Left Atrium: Left atrial size was normal in size. No left atrial/left atrial appendage thrombus was detected. Right Atrium: Right atrial size was normal in size. Interatrial Septum: No atrial level shunt detected by color flow Doppler. Pericardium: There is no evidence of pericardial effusion. Mitral Valve: The mitral valve is normal in structure. Mitral  valve regurgitation is mild by color flow Doppler. There is No evidence of mitral stenosis. Tricuspid Valve: The tricuspid valve was normal in structure. Tricuspid valve regurgitation is mild by color flow Doppler. No evidence of tricuspid stenosis is present. Aortic Valve: The aortic valve is tricuspid Aortic valve regurgitation was not visualized by color flow Doppler. There is no stenosis of the aortic valve. Calcification is present on the aortic valve with no stenosis. Pulmonic Valve: The pulmonic valve was normal in structure. Pulmonic valve regurgitation is trivial by color flow Doppler. Aorta: The aortic root, ascending aorta and aortic arch are normal in size and structure. +-------------+-----------++ AORTIC VALVE             +-------------+-----------++ AV Vmax:     124.00 cm/s +-------------+-----------++ AV Vmean:    96.300 cm/s +-------------+-----------++ AV VTI:      0.256 m     +-------------+-----------++ AV Peak Grad:6.2 mmHg    +-------------+-----------++ AV Mean Grad:4.0 mmHg    +-------------+-----------++  Albertha Ghee MD Electronically signed by Albertha Ghee MD Signature Date/Time: 11/14/2021/10:12:47 AM    Final    DG Abd Portable 1V  Result Date: 11/14/2021 CLINICAL DATA:  Abdominal distention, status post coronary artery bypass surgery EXAM: PORTABLE ABDOMEN - 1 VIEW COMPARISON:  11/13/2021 FINDINGS: Tip of enteric tube is seen in the antrum of the stomach. There is mild dilation of small-bowel loops measuring up to 3.5 cm in diameter. Gas and stool are noted in the colon. Stomach is not distended. IMPRESSION: Tip of enteric tube is seen in the antrum of the stomach. Mild dilation of small-bowel loops may suggest ileus. Electronically Signed   By: Elmer Picker M.D.   On: 11/14/2021 08:36   DG Chest Port 1 View  Result Date: 11/14/2021 CLINICAL DATA:  Status post coronary bypass surgery EXAM: PORTABLE CHEST 1 VIEW COMPARISON:  Previous studies  including the examination of 11/13/2021 FINDINGS: Transverse diameter of heart is increased. Linear densities seen in the left lower lung fields. There are no signs of pulmonary edema. There is evidence of coronary bypass surgery. Left chest tube is noted. There is no pleural effusion or pneumothorax. Enteric tube is noted traversing the esophagus. There is interval removal of Swan-Ganz catheter. Right IJ vascular sheath is noted in place. IMPRESSION: Cardiomegaly. Linear densities in the left lower lung fields suggest subsegmental atelectasis.  Electronically Signed   By: Elmer Picker M.D.   On: 11/14/2021 08:30   VAS [place] LOWER EXTREMITY VENOUS (DVT)  Result Date: 11/13/2021  Lower Venous DVT Study Patient Name:  [name] [name]  Date of Exam:   11/13/2021 Medical Rec #: [id number]     Accession #:    [id number] Date of Birth: Jan 30, 1956    Patient Gender: M Patient Age:   33 years Exam Location:  [facility] Procedure:      VAS [place] LOWER EXTREMITY VENOUS (DVT) Referring Phys: Ina Homes --------------------------------------------------------------------------------  Indications: Edema.  Risk Factors: Surgery S/P CABGx3 on 11/12/21. Limitations: Body habitus, poor ultrasound/tissue interface and bandages. Comparison Study: No previous exams Performing Technologist: Jody Hill RVT, RDMS  Examination Guidelines: A complete evaluation includes B-mode imaging, spectral Doppler, color Doppler, and power Doppler as needed of all accessible portions of each vessel. Bilateral testing is considered an integral part of a complete examination. Limited examinations for reoccurring indications may be performed as noted. The reflux portion of the exam is performed with the patient in reverse Trendelenburg.  +--------+---------------+---------+-----------+----------+--------------------+ RIGHT   CompressibilityPhasicitySpontaneityPropertiesThrombus Aging        +--------+---------------+---------+-----------+----------+--------------------+ CFV                    Yes      Yes                  patent by                                                                 color/doppler        +--------+---------------+---------+-----------+----------+--------------------+ SFJ                                                  patent by color      +--------+---------------+---------+-----------+----------+--------------------+ FV Prox Full           Yes      Yes                                       +--------+---------------+---------+-----------+----------+--------------------+ FV Mid  Full           Yes      Yes                                       +--------+---------------+---------+-----------+----------+--------------------+ FV      Full           Yes      Yes                                       Distal                                                                    +--------+---------------+---------+-----------+----------+--------------------+  PFV     Full                                                              +--------+---------------+---------+-----------+----------+--------------------+ POP     Full           Yes      Yes                                       +--------+---------------+---------+-----------+----------+--------------------+ PTV     Full                                                              +--------+---------------+---------+-----------+----------+--------------------+ PERO    Full                                                              +--------+---------------+---------+-----------+----------+--------------------+   Right Technical Findings: Very limited visualization of CFV & SFJ due to habitus. Limited visualzation of calf vessels due to bandage from vein harvest.  +--------+---------------+---------+-----------+----------+--------------------+ LEFT     CompressibilityPhasicitySpontaneityPropertiesThrombus Aging       +--------+---------------+---------+-----------+----------+--------------------+ CFV                                                  Not visualized       +--------+---------------+---------+-----------+----------+--------------------+ SFJ                                                  Not visualized       +--------+---------------+---------+-----------+----------+--------------------+ FV Prox Full           Yes      Yes                                       +--------+---------------+---------+-----------+----------+--------------------+ FV Mid  Full           Yes      Yes                                       +--------+---------------+---------+-----------+----------+--------------------+ FV      Full           Yes      Yes  Distal                                                                    +--------+---------------+---------+-----------+----------+--------------------+ PFV                    Yes      Yes                  patent by                                                                 color/doppler        +--------+---------------+---------+-----------+----------+--------------------+ POP     Full           Yes      Yes                                       +--------+---------------+---------+-----------+----------+--------------------+ PTV     Full                                                              +--------+---------------+---------+-----------+----------+--------------------+ PERO    Full                                                              +--------+---------------+---------+-----------+----------+--------------------+   Left Technical Findings: Not visualized segments include CFV and SFJ (bandage in groin area).   Summary: BILATERAL: -No evidence of popliteal cyst, bilaterally. RIGHT: -  There is no evidence of deep vein thrombosis in the lower extremity. However, portions of this examination were limited- see technologist comments above.  LEFT: - There is no evidence of deep vein thrombosis in the lower extremity. However, portions of this examination were limited- see technologist comments above.  *See table(s) above for measurements and observations. Electronically signed by Harold Barban MD on 11/13/2021 at 8:53:17 PM.    Final    DG Abd 1 View  Result Date: 11/13/2021 CLINICAL DATA:  NG tube placement EXAM: ABDOMEN - 1 VIEW COMPARISON:  11/13/2021 FINDINGS: Enteric tube tip is localized to the right upper quadrant consistent with location in the distal stomach. Gas-filled mildly distended small bowel as seen previously. IMPRESSION: Enteric tube tip is in the right upper quadrant consistent with location in the distal stomach. Electronically Signed   By: Lucienne Capers M.D.   On: 11/13/2021 18:04   DG Abd Portable 1V  Result Date: 11/13/2021 CLINICAL DATA:  Ileus EXAM: PORTABLE ABDOMEN - 1 VIEW COMPARISON:  None Available. FINDINGS: Gas in the stomach, small bowel, and  colon. Mild bowel distension. No air in the rectum. Small amount of stool in the colon. Negative skeletal structures. IMPRESSION: Mild bowel distension consistent with ileus. Electronically Signed   By: Franchot Gallo M.D.   On: 11/13/2021 17:30   ECHOCARDIOGRAM COMPLETE  Result Date: 11/13/2021    ECHOCARDIOGRAM REPORT   Patient Name:   [name] [name] Date of Exam: 11/13/2021 Medical Rec #:  [id number]    Height:       70.0 in Accession #:    [id number]   Weight:       295.6 lb Date of Birth:  07/04/1955   BSA:          2.464 m Patient Age:    40 years     BP:           126/62 mmHg Patient Gender: M            HR:           87 bpm. Exam Location:  Inpatient Procedure: 2D Echo, Cardiac Doppler, Color Doppler and Intracardiac            Opacification Agent Indications:    CHF  History:        Patient has prior history of  Echocardiogram examinations, most                 recent 07/30/2021. CHF, Arrythmias:Bradycardia; Risk                 Factors:Hypertension, Dyslipidemia and Diabetes. S/p CABG x 3.  Sonographer:    Luisa Hart RDCS Referring Phys: 53 LINDSAY NICOLE [facility]  Sonographer Comments: Patient is morbidly obese and Technically difficult study due to poor echo windows. Image acquisition challenging due to patient body habitus. IMPRESSIONS  1. Left ventricular ejection fraction, by estimation, is 55 to 60%. The left ventricle has normal function. Left ventricular endocardial border not optimally defined to evaluate regional wall motion. Left ventricular diastolic parameters are consistent with Grade II diastolic dysfunction (pseudonormalization).  2. Peak RV-RA gradient 28 mmHg. Right ventricular systolic function was not well visualized. The right ventricular size is not well visualized.  3. The mitral valve was not well visualized. No evidence of mitral valve regurgitation. No evidence of mitral stenosis.  4. The aortic valve is tricuspid. There is mild calcification of the aortic valve. Aortic valve regurgitation is not visualized. Aortic valve sclerosis is present, with no evidence of aortic valve stenosis.  5. No significant pericardial effusion.  6. The heart is poorly visualized. Grossly, LV systolic function appears to be in the normal range. FINDINGS  Left Ventricle: Left ventricular ejection fraction, by estimation, is 55 to 60%. The left ventricle has normal function. Left ventricular endocardial border not optimally defined to evaluate regional wall motion. The left ventricular internal cavity size was normal in size. There is no left ventricular hypertrophy. Left ventricular diastolic parameters are consistent with Grade II diastolic dysfunction (pseudonormalization). Right Ventricle: Peak RV-RA gradient 28 mmHg. The right ventricular size is not well visualized. Right vetricular wall thickness was not well  visualized. Right ventricular systolic function was not well visualized. Left Atrium: Left atrial size was not well visualized. Right Atrium: Right atrial size was not well visualized. Pericardium: There is no evidence of pericardial effusion. Mitral Valve: The mitral valve was not well visualized. No evidence of mitral valve regurgitation. No evidence of mitral valve stenosis. MV peak gradient, 9.1 mmHg. The mean mitral valve gradient is 3.0 mmHg. Tricuspid Valve: The tricuspid  valve is normal in structure. Tricuspid valve regurgitation is trivial. Aortic Valve: The aortic valve is tricuspid. There is mild calcification of the aortic valve. Aortic valve regurgitation is not visualized. Aortic valve sclerosis is present, with no evidence of aortic valve stenosis. Aortic valve mean gradient measures 2.5 mmHg. Aortic valve peak gradient measures 4.8 mmHg. Aortic valve area, by VTI measures 6.12 cm. Pulmonic Valve: The pulmonic valve was normal in structure. Pulmonic valve regurgitation is not visualized. Aorta: The aortic root is normal in size and structure. Venous: The inferior vena cava was not well visualized. IAS/Shunts: The interatrial septum was not well visualized.  LEFT VENTRICLE PLAX 2D LVIDd:         5.10 cm   Diastology LVIDs:         4.00 cm   LV e' medial:    5.92 cm/s LV PW:         1.20 cm   LV E/e' medial:  27.2 LV IVS:        1.10 cm   LV e' lateral:   7.22 cm/s LVOT diam:     2.60 cm   LV E/e' lateral: 22.3 LV SV:         102 LV SV Index:   42 LVOT Area:     5.31 cm  RIGHT VENTRICLE RV S prime:     10.10 cm/s LEFT ATRIUM         Index LA diam:    2.70 cm 1.10 cm/m  AORTIC VALVE                    PULMONIC VALVE AV Area (Vmax):    5.36 cm     PV Vmax:       1.02 m/s AV Area (Vmean):   5.17 cm     PV Vmean:      67.500 cm/s AV Area (VTI):     6.12 cm     PV VTI:        0.144 m AV Vmax:           109.00 cm/s  PV Peak grad:  4.2 mmHg AV Vmean:          74.650 cm/s  PV Mean grad:  2.0 mmHg AV VTI:             0.168 m AV Peak Grad:      4.8 mmHg AV Mean Grad:      2.5 mmHg LVOT Vmax:         110.00 cm/s LVOT Vmean:        72.700 cm/s LVOT VTI:          0.193 m LVOT/AV VTI ratio: 1.15  AORTA Ao Root diam: 3.70 cm Ao Asc diam:  3.60 cm MITRAL VALVE                TRICUSPID VALVE MV Area (PHT): 3.91 cm     TR Peak grad:   28.5 mmHg MV Area VTI:   2.44 cm     TR Vmax:        267.00 cm/s MV Peak grad:  9.1 mmHg MV Mean grad:  3.0 mmHg     SHUNTS MV Vmax:       1.51 m/s     Systemic VTI:  0.19 m MV Vmean:      80.3 cm/s    Systemic Diam: 2.60 cm MV Decel Time: 194 msec MV E velocity: 161.00 cm/s MV A velocity: 52.60  cm/s MV E/A ratio:  3.06 Dalton McleanMD Electronically signed by Franki Monte Signature Date/Time: 11/13/2021/3:42:40 PM    Final    DG Chest Port 1 View  Result Date: 11/13/2021 CLINICAL DATA:  Status post CABG, dyspnea EXAM: PORTABLE CHEST 1 VIEW COMPARISON:  Chest radiograph from one day prior. FINDINGS: Interval extubation and removal of enteric tube. Stable right internal jugular Swan-Ganz catheter terminating over the main pulmonary artery. Intact sternotomy wires. Stable mid to upper left chest tube. Stable cardiomediastinal silhouette with mild to moderate cardiomegaly. No pneumothorax. No pleural effusion. Cephalization of the pulmonary vasculature without overt pulmonary edema. Mild left retrocardiac atelectasis, similar. IMPRESSION: 1. No pneumothorax. 2. Stable mild to moderate cardiomegaly without overt pulmonary edema. 3. Stable mild left retrocardiac atelectasis. Electronically Signed   By: Ilona Sorrel M.D.   On: 11/13/2021 08:09   DG Chest Port 1 View  Result Date: 11/12/2021 CLINICAL DATA:  Status post CABG. EXAM: PORTABLE CHEST 1 VIEW COMPARISON:  Nov 08, 2021 FINDINGS: Surgical changes of median sternotomy and CABG. Right IJ Swan-Ganz catheter with tip projecting over the main pulmonary artery. Endotracheal tube with tip projecting over the midthoracic trachea. Enteric tube  courses below the diaphragm with tip obscured by collimation. Mild cardiac enlargement at least somewhat accentuated by technique. Low lung volumes with dependent atelectasis. No visible pleural effusion or pneumothorax. IMPRESSION: 1. Postsurgical change of median sternotomy and CABG with low lung volumes and dependent atelectasis. 2. Mild cardiac enlargement. No overt pulmonary edema or pleural effusions. Electronically Signed   By: Dahlia Bailiff M.D.   On: 11/12/2021 15:16   DG Chest 2 View  Result Date: 11/09/2021 CLINICAL DATA:  Preop chest radiograph. EXAM: CHEST - 2 VIEW COMPARISON:  Cardiac CT dated 10/18/2021. FINDINGS: No focal consolidation, pleural effusion or pneumothorax. The cardiac silhouette is within limits. Atherosclerotic calcification of the aortic arch. No acute osseous pathology. IMPRESSION: No active cardiopulmonary disease. Electronically Signed   By: Anner Crete M.D.   On: 11/09/2021 22:51   VAS US DOPPLER PRE CABG  Result Date: 11/08/2021 PREOPERATIVE VASCULAR EVALUATION Patient Name:  [name] [name]  Date of Exam:   11/08/2021 Medical Rec #: [id number]     Accession #:    [id number] Date of Birth: 1955-07-06    Patient Gender: M Patient Age:   32 years Exam Location:  [facility] Procedure:      VAS US DOPPLER PRE CABG Referring Phys: Collier Salina Kush [place] --------------------------------------------------------------------------------  Indications:      Pre-CABG. Risk Factors:     Hypertension, hyperlipidemia, Diabetes. Limitations:      A-fib Comparison Study: No prior study Performing Technologist: Maudry Mayhew MHA, RVT, RDCS, RDMS  Examination Guidelines: A complete evaluation includes B-mode imaging, spectral Doppler, color Doppler, and power Doppler as needed of all accessible portions of each vessel. Bilateral testing is considered an integral part of a complete examination. Limited examinations for reoccurring indications may be performed as noted.  Right Carotid  Findings: +---------+--------+-------+--------+---------------------------------+--------+          PSV cm/sEDV    StenosisDescribe                         Comments                  cm/s                                                     +---------+--------+-------+--------+---------------------------------+--------+  CCA Prox 88      14             smooth and homogeneous                    +---------+--------+-------+--------+---------------------------------+--------+ CCA      83      13                                                       Distal                                                                    +---------+--------+-------+--------+---------------------------------+--------+ ICA Prox 114     19             heterogenous, calcific and                                                irregular                                 +---------+--------+-------+--------+---------------------------------+--------+ ICA      68      20                                                       Distal                                                                    +---------+--------+-------+--------+---------------------------------+--------+ ECA      93                                                               +---------+--------+-------+--------+---------------------------------+--------+ +----------+--------+-------+----------------+------------+           PSV cm/sEDV cmsDescribe        Arm Pressure +----------+--------+-------+----------------+------------+ [id number]            Multiphasic, WNL             +----------+--------+-------+----------------+------------+ +---------+--------+--+--------+-+---------+ VertebralPSV cm/s38EDV cm/s9Antegrade +---------+--------+--+--------+-+---------+ Left Carotid Findings: +----------+--------+--------+--------+-------------------------------+--------+           PSV  cm/sEDV cm/sStenosisDescribe                       Comments +----------+--------+--------+--------+-------------------------------+--------+ CCA Prox  119     19                                                      +----------+--------+--------+--------+-------------------------------+--------+  CCA Mid   128     21                                                      +----------+--------+--------+--------+-------------------------------+--------+ CCA Distal104     13              smooth and heterogenous                 +----------+--------+--------+--------+-------------------------------+--------+ ICA Prox  73      13              heterogenous, irregular and                                               hyperechoic                             +----------+--------+--------+--------+-------------------------------+--------+ ICA Distal72      22                                                      +----------+--------+--------+--------+-------------------------------+--------+ ECA       85      8                                                       +----------+--------+--------+--------+-------------------------------+--------+ +----------+--------+--------+----------------+------------+ SubclavianPSV cm/sEDV cm/sDescribe        Arm Pressure +----------+--------+--------+----------------+------------+           158             Multiphasic, WNL             +----------+--------+--------+----------------+------------+ +---------+--------+--+--------+--+---------+ VertebralPSV cm/s41EDV cm/s13Antegrade +---------+--------+--+--------+--+---------+  ABI Findings: +---------+------------------+-----+----------+--------+ Right    Rt Pressure (mmHg)IndexWaveform  Comment  +---------+------------------+-----+----------+--------+ Brachial 97                     triphasic          +---------+------------------+-----+----------+--------+ PTA       111               0.89 monophasic         +---------+------------------+-----+----------+--------+ DP       151               1.21 monophasic         +---------+------------------+-----+----------+--------+ Great Toe80                0.64                    +---------+------------------+-----+----------+--------+ +---------+------------------+-----+----------+-------+ Left     Lt Pressure (mmHg)IndexWaveform  Comment +---------+------------------+-----+----------+-------+ Brachial 125                    triphasic         +---------+------------------+-----+----------+-------+ PTA      175  1.40 monophasic        +---------+------------------+-----+----------+-------+ DP       128               1.02 monophasic        +---------+------------------+-----+----------+-------+ Great Toe58                0.46                   +---------+------------------+-----+----------+-------+ +-------+---------------+----------------+ ABI/TBIToday's ABI/TBIPrevious ABI/TBI +-------+---------------+----------------+ Right  1.21/0.64                       +-------+---------------+----------------+ Left   1.40/0.46                       +-------+---------------+----------------+  Right Doppler Findings: +-----------+--------+-----+---------+-----------------------------------------+ Site       PressureIndexDoppler  Comments                                  +-----------+--------+-----+---------+-----------------------------------------+ Brachial   97           triphasic                                          +-----------+--------+-----+---------+-----------------------------------------+ Radial                  triphasic                                          +-----------+--------+-----+---------+-----------------------------------------+ Ulnar                   triphasic                                           +-----------+--------+-----+---------+-----------------------------------------+ Palmar Arch                      Signal obliterates with radial                                             compression, is unaffected with ulnar                                      compression.                              +-----------+--------+-----+---------+-----------------------------------------+  Left Doppler Findings: +-----------+--------+-----+---------+-----------------------------------------+ Site       PressureIndexDoppler  Comments                                  +-----------+--------+-----+---------+-----------------------------------------+ Brachial   125          triphasic                                          +-----------+--------+-----+---------+-----------------------------------------+  Radial                  triphasic                                          +-----------+--------+-----+---------+-----------------------------------------+ Ulnar                   triphasic                                          +-----------+--------+-----+---------+-----------------------------------------+ Palmar Arch                      Signal obliterates with radial                                             compression, decreases >50% with ulnar                                     compression                               +-----------+--------+-----+---------+-----------------------------------------+  Summary: Right Carotid: Velocities in the right ICA are consistent with a 1-39% stenosis. Left Carotid: Velocities in the left ICA are consistent with a 1-39% stenosis. Vertebrals:  Bilateral vertebral arteries demonstrate antegrade flow. Subclavians: Normal flow hemodynamics were seen in bilateral subclavian              arteries. Right ABI: Resting right ankle-brachial index is within normal limits, however give abnormal waveforms this is likely falsely  elevated, suggestive of medial arterial calcification of the right lower extremity arteries. The right toe-brachial index is abnormal. Left ABI: Resting left ankle-brachial index indicates noncompressible left lower extremity arteries, suggestive of medial arterial calcification. The left toe-brachial index is abnormal. Right Upper Extremity: Doppler waveform obliterate with right radial compression. Doppler waveforms remain within normal limits with right ulnar compression. Left Upper Extremity: Doppler waveform obliterate with left radial compression. Doppler waveforms decrease >50% with left ulnar compression.  Electronically signed by Jamelle Haring on 11/08/2021 at 5:26:46 PM.    Final        Discharge Instructions     Amb Referral to Cardiac Rehabilitation   Complete by: As directed    Will send Card[facility] Rehab Phase 2 to [facility]   Diagnosis: CABG   CABG X ___: 3   After initial evaluation and assessments completed: Virtual Based Care may be provided alone or in conjunction with Phase 2 Cardiac Rehab based on patient barriers.: Yes       Discharge Medications: Allergies as of 12/04/2021   No Known Allergies      Medication List     STOP taking these medications    Farxiga 10 MG Tabs tablet Generic drug: dapagliflozin propanediol   furosemide 20 MG tablet Commonly known as: LASIX   glipiZIDE 10 MG tablet Commonly known as: GLUCOTROL   losartan 50 MG tablet Commonly known as: COZAAR   metFORMIN 1000 MG tablet Commonly known as: GLUCOPHAGE   metoprolol  succinate 50 MG 24 hr tablet Commonly known as: TOPROL-XL   rivaroxaban 20 MG Tabs tablet Commonly known as: XARELTO   rosuvastatin 20 MG tablet Commonly known as: CRESTOR       TAKE these medications    acetaminophen 325 MG tablet Commonly known as: TYLENOL Take 2 tablets (650 mg total) by mouth every 6 (six) hours as needed for mild pain.   albuterol 108 (90 Base) MCG/ACT inhaler Commonly known as:  VENTOLIN HFA Inhale 2 puffs into the lungs every 6 (six) hours as needed for wheezing or shortness of breath.   amiodarone 200 MG tablet Commonly known as: PACERONE Take 1 tablet (200 mg total) by mouth 2 (two) times daily for ten days; then take 1 tablet (200 mg) daily thereafter.   aspirin EC 81 MG tablet Take 1 tablet (81 mg total) by mouth daily. Swallow whole.   atorvastatin 80 MG tablet Commonly known as: LIPITOR Take 1 tablet (80 mg total) by mouth daily.   BD Pen Needle Nano U/F 32G X 4 MM Misc Generic drug: Insulin Pen Needle Use to inject Levemir 2 (two) times daily.   Eliquis 5 MG Tabs tablet Generic drug: apixaban Take 1 tablet (5 mg total) by mouth 2 (two) times daily.   fluticasone furoate-vilanterol 200-25 MCG/ACT Aepb Commonly known as: BREO ELLIPTA Inhale 1 puff into the lungs daily.   guaiFENesin 600 MG 12 hr tablet Commonly known as: MUCINEX Take 2 tablets (1,200 mg total) by mouth 2 (two) times daily as needed.   Levemir FlexPen 100 UNIT/ML FlexPen Generic drug: insulin detemir Inject 30 Units into the skin 2 (two) times daily.   midodrine 5 MG tablet Commonly known as: PROAMATINE Take 1 tablet (5 mg total) by mouth 3 (three) times daily with meals.   traMADol 50 MG tablet Commonly known as: ULTRAM Take 1 tablet (50 mg total) by mouth every 12 (twelve) hours as needed for moderate pain.   Trulicity 3 DG/6.4QI Sopn Generic drug: Dulaglutide Inject 3 mg into the skin every Monday.               Durable Medical Equipment  (From admission, onward)           Start     Ordered   11/22/21 1649  For home use only DME 4 wheeled rolling walker with seat  Once       Question:  Patient needs a walker to treat with the following condition  Answer:  Heart failure ([place])   11/22/21 1649   11/22/21 1649  For home use only DME 3 n 1  Once        11/22/21 1649           The patient has been discharged on:   1.Beta Blocker:  Yes [   ]                               No   [  x ]                              If No, reason:Labile BP, shock  2.Ace Inhibitor/ARB: Yes [   ]                                     No  [  x  ]                                     If No, reason:Elevated creatinine  3.Statin:   Yes [x   ]                  No  [   ]                  If No, reason:  4.Ecasa:  Yes  [ x  ]                  No   [   ]                  If No, reason:  Patient had ACS upon admission:  Plavix/P2Y12 inhibitor: Yes [   ]                                      No  [  x ]   Follow Up Appointments:  Follow-up Information     Dr. Prescott Gum. Go on 01/06/2022.   Why: PA/LAT CXR to be taken (at [facility] which is in the same building as Dr. Lucianne Lei Trigt's office) on 07/17 at 4:00 pm;Appointment time is at 4:30 pm Contact information: [address] # [place], [place], [place] [address] [phone]        Richardo Priest, MD. Go to.   Specialties: Cardiology, Radiology Why: We have sent a request to Dr. Joya Gaskins office to move up your follow-up from July to a sooner appointment. The office will call you to move up this visit. Contact information: [address] [place] [address] [phone]         Cyndi Bender, PA-C. Call.   Specialty: Physician Assistant Why: for a follow up 1 week regarding further diabetes management and surveillance of HGA1C 9.3 Contact information: [place] [address] [phone]         Constance Haw, MD .   Specialty: Cardiology Contact information: [place] [address] [phone]         Dwana Melena, MD. Call.   Specialty: Nephrology Why: for a follow up Contact information: [place]  [id number] [place], [facility]. Go on 12/06/2021.   Why: Schedule is Monday/Wednesday/Friday with 12:10 chair time.  Please arrive at 11:10 on Friday to complete paperwork prior to  treatment. Contact information: [place] [address] [phone]                 Signed: Ellamae Sia 12/04/2021, 7:44 AM  DC instructions reviewed with patient patient examined and medical record reviewed,agree with above note. Dahlia Byes 12/09/2021

## 2021-11-13 NOTE — Progress Notes (Signed)
BLE venous duplex has been completed.     Results can be found under chart review under CV PROC. 11/13/2021 7:00 PM Clarise Chacko RVT, RDMS

## 2021-11-14 ENCOUNTER — Inpatient Hospital Stay (HOSPITAL_COMMUNITY): Payer: Medicare HMO

## 2021-11-14 DIAGNOSIS — R570 Cardiogenic shock: Secondary | ICD-10-CM | POA: Diagnosis not present

## 2021-11-14 DIAGNOSIS — Z951 Presence of aortocoronary bypass graft: Secondary | ICD-10-CM | POA: Diagnosis not present

## 2021-11-14 LAB — BASIC METABOLIC PANEL
Anion gap: 15 (ref 5–15)
Anion gap: 14 (ref 5–15)
BUN: 33 mg/dL — ABNORMAL HIGH (ref 8–23)
BUN: 34 mg/dL — ABNORMAL HIGH (ref 8–23)
CO2: 18 mmol/L — ABNORMAL LOW (ref 22–32)
CO2: 20 mmol/L — ABNORMAL LOW (ref 22–32)
Calcium: 7.1 mg/dL — ABNORMAL LOW (ref 8.9–10.3)
Calcium: 6.9 mg/dL — ABNORMAL LOW (ref 8.9–10.3)
Chloride: 99 mmol/L (ref 98–111)
Chloride: 100 mmol/L (ref 98–111)
Creatinine, Ser: 3.74 mg/dL — ABNORMAL HIGH (ref 0.61–1.24)
Creatinine, Ser: 3.66 mg/dL — ABNORMAL HIGH (ref 0.61–1.24)
GFR, Estimated: 17 mL/min — ABNORMAL LOW (ref >60)
GFR, Estimated: 17 mL/min — ABNORMAL LOW (ref >60)
Glucose, Bld: 449 mg/dL — ABNORMAL HIGH (ref 70–99)
Glucose, Bld: 242 mg/dL — ABNORMAL HIGH (ref 70–99)
Potassium: 4.6 mmol/L (ref 3.5–5.1)
Potassium: 3.7 mmol/L (ref 3.5–5.1)
Sodium: 132 mmol/L — ABNORMAL LOW (ref 135–145)
Sodium: 134 mmol/L — ABNORMAL LOW (ref 135–145)

## 2021-11-14 LAB — GLUCOSE, CAPILLARY
Glucose-Capillary: 442 mg/dL — ABNORMAL HIGH (ref 70–99)
Glucose-Capillary: 399 mg/dL — ABNORMAL HIGH (ref 70–99)
Glucose-Capillary: 411 mg/dL — ABNORMAL HIGH (ref 70–99)
Glucose-Capillary: 386 mg/dL — ABNORMAL HIGH (ref 70–99)
Glucose-Capillary: 340 mg/dL — ABNORMAL HIGH (ref 70–99)
Glucose-Capillary: 189 mg/dL — ABNORMAL HIGH (ref 70–99)
Glucose-Capillary: 244 mg/dL — ABNORMAL HIGH (ref 70–99)
Glucose-Capillary: 257 mg/dL — ABNORMAL HIGH (ref 70–99)
Glucose-Capillary: 327 mg/dL — ABNORMAL HIGH (ref 70–99)
Glucose-Capillary: 386 mg/dL — ABNORMAL HIGH (ref 70–99)
Glucose-Capillary: 419 mg/dL — ABNORMAL HIGH (ref 70–99)
Glucose-Capillary: 207 mg/dL — ABNORMAL HIGH (ref 70–99)
Glucose-Capillary: 183 mg/dL — ABNORMAL HIGH (ref 70–99)
Glucose-Capillary: 184 mg/dL — ABNORMAL HIGH (ref 70–99)
Glucose-Capillary: 139 mg/dL — ABNORMAL HIGH (ref 70–99)
Glucose-Capillary: 139 mg/dL — ABNORMAL HIGH (ref 70–99)
Glucose-Capillary: 149 mg/dL — ABNORMAL HIGH (ref 70–99)
Glucose-Capillary: 186 mg/dL — ABNORMAL HIGH (ref 70–99)

## 2021-11-14 LAB — ECHO INTRAOPERATIVE TEE
AV Mean grad: 4 mmHg
AV Peak grad: 6.2 mmHg
Ao pk vel: 1.24 m/s
Height: 70 in
Weight: 4483.27 oz

## 2021-11-14 LAB — HEPATIC FUNCTION PANEL
ALT: 9 U/L (ref 0–44)
AST: 29 U/L (ref 15–41)
Albumin: 3.8 g/dL (ref 3.5–5.0)
Alkaline Phosphatase: 32 U/L — ABNORMAL LOW (ref 38–126)
Bilirubin, Direct: 0.2 mg/dL (ref 0.0–0.2)
Indirect Bilirubin: 0.8 mg/dL (ref 0.3–0.9)
Total Bilirubin: 1 mg/dL (ref 0.3–1.2)
Total Protein: 6 g/dL — ABNORMAL LOW (ref 6.5–8.1)

## 2021-11-14 LAB — POCT I-STAT 7, (LYTES, BLD GAS, ICA,H+H)
Acid-base deficit: 6 mmol/L — ABNORMAL HIGH (ref 0.0–2.0)
Bicarbonate: 19.6 mmol/L — ABNORMAL LOW (ref 20.0–28.0)
Calcium, Ion: 1 mmol/L — ABNORMAL LOW (ref 1.15–1.40)
HCT: 29 % — ABNORMAL LOW (ref 39.0–52.0)
Hemoglobin: 9.9 g/dL — ABNORMAL LOW (ref 13.0–17.0)
O2 Saturation: 95 %
Patient temperature: 37
Potassium: 4.7 mmol/L (ref 3.5–5.1)
Sodium: 131 mmol/L — ABNORMAL LOW (ref 135–145)
TCO2: 21 mmol/L — ABNORMAL LOW (ref 22–32)
pCO2 arterial: 39.7 mmHg (ref 32–48)
pH, Arterial: 7.303 — ABNORMAL LOW (ref 7.35–7.45)
pO2, Arterial: 82 mmHg — ABNORMAL LOW (ref 83–108)

## 2021-11-14 LAB — CBC
HCT: 28.7 % — ABNORMAL LOW (ref 39.0–52.0)
Hemoglobin: 9.4 g/dL — ABNORMAL LOW (ref 13.0–17.0)
MCH: 30.9 pg (ref 26.0–34.0)
MCHC: 32.8 g/dL (ref 30.0–36.0)
MCV: 94.4 fL (ref 80.0–100.0)
Platelets: 160 10*3/uL (ref 150–400)
RBC: 3.04 MIL/uL — ABNORMAL LOW (ref 4.22–5.81)
RDW: 14.6 % (ref 11.5–15.5)
WBC: 21.8 10*3/uL — ABNORMAL HIGH (ref 4.0–10.5)
nRBC: 0 % (ref 0.0–0.2)

## 2021-11-14 LAB — LACTIC ACID, PLASMA
Lactic Acid, Venous: 3.5 mmol/L (ref 0.5–1.9)
Lactic Acid, Venous: 2.3 mmol/L (ref 0.5–1.9)
Lactic Acid, Venous: 3.2 mmol/L (ref 0.5–1.9)
Lactic Acid, Venous: 2.5 mmol/L (ref 0.5–1.9)

## 2021-11-14 LAB — COOXEMETRY PANEL
Carboxyhemoglobin: 0.6 % (ref 0.5–1.5)
Carboxyhemoglobin: 0.7 % (ref 0.5–1.5)
Methemoglobin: <0.7 % (ref 0.0–1.5)
Methemoglobin: <0.7 % (ref 0.0–1.5)
O2 Saturation: 57.9 %
O2 Saturation: 70.4 %
Total hemoglobin: 9.7 g/dL — ABNORMAL LOW (ref 12.0–16.0)
Total hemoglobin: 8.6 g/dL — ABNORMAL LOW (ref 12.0–16.0)

## 2021-11-14 LAB — PROCALCITONIN: Procalcitonin: 1.83 ng/mL

## 2021-11-14 LAB — VANCOMYCIN, TROUGH: Vancomycin Tr: 20 ug/mL (ref 15–20)

## 2021-11-14 MED ORDER — DEXTROSE 50 % IV SOLN: 0.0000 mL | INTRAVENOUS | Status: DC | PRN

## 2021-11-14 MED ORDER — INSULIN REGULAR(HUMAN) IN NACL 100-0.9 UT/100ML-% IV SOLN
INTRAVENOUS | Status: DC
  Administered 2021-11-14: 7 [IU]/h via INTRAVENOUS
  Administered 2021-11-14: 11.5 [IU]/h via INTRAVENOUS
  Administered 2021-11-15: 5.5 [IU]/h via INTRAVENOUS
  Filled 2021-11-14 (×4): qty 100

## 2021-11-14 MED ORDER — AMIODARONE IV BOLUS ONLY 150 MG/100ML: 150.0000 mg | Freq: Once | INTRAVENOUS | Status: DC

## 2021-11-14 MED ORDER — SODIUM CHLORIDE 0.9 % IV SOLN
2.0000 g | Freq: Two times a day (BID) | INTRAVENOUS | Status: AC
  Administered 2021-11-15: 2 g via INTRAVENOUS
  Filled 2021-11-14: qty 12.5

## 2021-11-14 MED ORDER — SODIUM CHLORIDE 0.9 % IV SOLN: 2.0000 g | INTRAVENOUS | Status: DC

## 2021-11-14 MED ORDER — VASOPRESSIN 20 UNITS/100 ML INFUSION FOR SHOCK
0.0000 [IU]/min | INTRAVENOUS | Status: DC
  Administered 2021-11-14 – 2021-11-17 (×8): 0.03 [IU]/min via INTRAVENOUS
  Filled 2021-11-14 (×9): qty 100

## 2021-11-14 MED ORDER — VANCOMYCIN VARIABLE DOSE PER UNSTABLE RENAL FUNCTION (PHARMACIST DOSING): Status: DC

## 2021-11-14 MED ORDER — CEFEPIME HCL 1 G IJ SOLR
1.0000 g | Freq: Two times a day (BID) | INTRAMUSCULAR | Status: DC
Start: 2021-11-14 — End: 2021-11-14
  Administered 2021-11-14: 1 g via INTRAVENOUS
  Filled 2021-11-14 (×2): qty 10

## 2021-11-14 MED ORDER — INSULIN DETEMIR 100 UNIT/ML ~~LOC~~ SOLN
30.0000 [IU] | Freq: Two times a day (BID) | SUBCUTANEOUS | Status: DC
  Filled 2021-11-14 (×5): qty 0.3

## 2021-11-14 MED ORDER — AMIODARONE LOAD VIA INFUSION
150.0000 mg | Freq: Once | INTRAVENOUS | Status: AC
  Administered 2021-11-14: 150 mg via INTRAVENOUS
  Filled 2021-11-14: qty 83.34

## 2021-11-14 MED ORDER — NEOSTIGMINE METHYLSULFATE 10 MG/10ML IV SOLN
0.2500 mg | Freq: Four times a day (QID) | INTRAVENOUS | Status: AC
  Administered 2021-11-14 – 2021-11-16 (×8): 0.25 mg via SUBCUTANEOUS
  Filled 2021-11-14 (×8): qty 0.25

## 2021-11-14 MED ORDER — SODIUM CHLORIDE 0.9 % IV SOLN
1.0000 g | Freq: Two times a day (BID) | INTRAVENOUS | Status: DC
  Filled 2021-11-14: qty 10

## 2021-11-14 MED ORDER — LEVALBUTEROL HCL 1.25 MG/0.5ML IN NEBU
1.2500 mg | INHALATION_SOLUTION | Freq: Three times a day (TID) | RESPIRATORY_TRACT | Status: DC
Start: 2021-11-14 — End: 2021-11-14
  Administered 2021-11-14: 1.25 mg via RESPIRATORY_TRACT
  Filled 2021-11-14: qty 0.5

## 2021-11-14 MED ORDER — LEVALBUTEROL HCL 1.25 MG/0.5ML IN NEBU
1.2500 mg | INHALATION_SOLUTION | Freq: Four times a day (QID) | RESPIRATORY_TRACT | Status: DC | PRN
Start: 2021-11-14 — End: 2021-11-16
  Administered 2021-11-15 – 2021-11-16 (×2): 1.25 mg via RESPIRATORY_TRACT
  Filled 2021-11-14 (×2): qty 0.5

## 2021-11-14 MED ORDER — ENOXAPARIN SODIUM 30 MG/0.3ML IJ SOSY
30.0000 mg | PREFILLED_SYRINGE | INTRAMUSCULAR | Status: DC
Start: 2021-11-14 — End: 2021-11-16
  Administered 2021-11-14 – 2021-11-15 (×2): 30 mg via SUBCUTANEOUS
  Filled 2021-11-14 (×2): qty 0.3

## 2021-11-14 MED ORDER — HYDROMORPHONE HCL 1 MG/ML IJ SOLN
0.2000 mg | INTRAMUSCULAR | Status: DC | PRN
  Administered 2021-11-14 – 2021-11-25 (×4): 0.2 mg via INTRAVENOUS
  Filled 2021-11-14 (×4): qty 0.5

## 2021-11-14 MED ORDER — POTASSIUM CHLORIDE CRYS ER 20 MEQ PO TBCR
20.0000 meq | EXTENDED_RELEASE_TABLET | Freq: Once | ORAL | Status: AC
  Administered 2021-11-14: 20 meq via ORAL
  Filled 2021-11-14: qty 1

## 2021-11-14 MED ORDER — METOLAZONE 5 MG PO TABS
5.0000 mg | ORAL_TABLET | Freq: Once | ORAL | Status: AC
  Administered 2021-11-14: 5 mg via ORAL
  Filled 2021-11-14: qty 1

## 2021-11-14 MED ORDER — OXYCODONE HCL 5 MG PO TABS
5.0000 mg | ORAL_TABLET | ORAL | Status: DC | PRN
  Administered 2021-11-14 – 2021-11-20 (×6): 10 mg via ORAL
  Administered 2021-11-20 – 2021-11-22 (×3): 5 mg via ORAL
  Administered 2021-11-23: 10 mg via ORAL
  Administered 2021-11-23: 5 mg via ORAL
  Administered 2021-11-23 – 2021-11-26 (×8): 10 mg via ORAL
  Administered 2021-11-27 (×2): 5 mg via ORAL
  Administered 2021-11-28 – 2021-11-30 (×3): 10 mg via ORAL
  Filled 2021-11-14: qty 2
  Filled 2021-11-14 (×2): qty 1
  Filled 2021-11-14 (×2): qty 2
  Filled 2021-11-14: qty 1
  Filled 2021-11-14 (×3): qty 2
  Filled 2021-11-14 (×3): qty 1
  Filled 2021-11-14 (×4): qty 2
  Filled 2021-11-14: qty 1
  Filled 2021-11-14 (×2): qty 2
  Filled 2021-11-14: qty 1
  Filled 2021-11-14 (×4): qty 2
  Filled 2021-11-14: qty 1
  Filled 2021-11-14: qty 2

## 2021-11-14 NOTE — Progress Notes (Addendum)
[facility] DAILY ICU PROGRESS NOTE                   [address]            [place] [address]          [phone]   2 Days Post-Op Procedure(s) (LRB): CORONARY ARTERY BYPASS GRAFTING (CABG) x  3 USING LEFT INTERNAL MAMMARY ARTERY AND RIGHT GREATER SAPHENOUS VEIN (N/A) MAZE (N/A) TRANSESOPHAGEAL ECHOCARDIOGRAM (TEE) (N/A) ENDOVEIN HARVEST OF GREATER SAPHENOUS VEIN  Total Length of Stay:  LOS: 2 days   Subjective: Patient sitting in chair. He has complaints of back pain (had prior to surgery).  Objective: Vital signs in last 24 hours: Temp:  [96.4 F (35.8 C)-98.8 F (37.1 C)] 98.4 F (36.9 C) (05/25 0700) Pulse Rate:  [78-100] 87 (05/25 0700) Resp:  [11-29] 19 (05/25 0700) BP: (88-141)/(39-88) 122/62 (05/25 0700) SpO2:  [89 %-99 %] 98 % (05/25 0700) Arterial Line BP: (80-179)/(38-82) 139/52 (05/25 0700) Weight:  [123.1 kg] 123.1 kg (05/25 0630)  Filed Weights   11/12/21 0602 11/13/21 0222 11/14/21 0630  Weight: 127.1 kg 134.1 kg 123.1 kg    Weight change: -11 kg   Hemodynamic parameters for last 24 hours: PAP: (41-61)/(15-33) 61/23 CVP:  [9 mmHg-33 mmHg] 22 mmHg  Intake/Output from previous day: 05/24 0701 - 05/25 0700 In: 3240.3 [I.V.:2615.6; IV Piggyback:624.7] Out: 2951 [Urine:439; Emesis/NG output:350; Chest Tube:435]  Intake/Output this shift: No intake/output data recorded.  Current Meds: Scheduled Meds:  acetaminophen  1,000 mg Oral Q6H   Or   acetaminophen (TYLENOL) oral liquid 160 mg/5 mL  1,000 mg Per Tube Q6H   aspirin EC  325 mg Oral Daily   Or   aspirin  324 mg Per Tube Daily   bisacodyl  10 mg Oral Daily   Or   bisacodyl  10 mg Rectal Daily   Chlorhexidine Gluconate Cloth  6 each Topical Daily   docusate sodium  200 mg Oral Daily   fluticasone furoate-vilanterol  1 puff Inhalation Daily   insulin aspart  0-24 Units Subcutaneous Q4H   insulin detemir  24 Units Subcutaneous BID   levalbuterol  1.25 mg Nebulization TID    lidocaine  2 patch Transdermal Q24H   metoCLOPramide (REGLAN) injection  10 mg Intravenous Q6H   mupirocin cream   Topical BID   pantoprazole  40 mg Oral Daily   potassium chloride  40 mEq Oral Once   rosuvastatin  20 mg Oral Daily   sodium chloride flush  3 mL Intravenous Q12H   Continuous Infusions:  sodium chloride Stopped (11/14/21 0549)   sodium chloride     sodium chloride 20 mL/hr at 11/12/21 1509   albumin human 60 mL/hr at 11/14/21 0700   albumin human     amiodarone 60 mg/hr (11/14/21 0700)   furosemide (LASIX) 200 mg in dextrose 5% 100 mL (2mg /mL) infusion 10 mg/hr (11/14/21 0700)   lactated ringers Stopped (11/13/21 2251)   lactated ringers 20 mL/hr at 11/12/21 1600   milrinone 0.25 mcg/kg/min (11/14/21 0700)   norepinephrine (LEVOPHED) Adult infusion 18 mcg/min (11/14/21 0700)    sodium bicarbonate (isotonic) infusion in sterile water 50 mL/hr at 11/14/21 0700   vasopressin 0.03 Units/min (11/14/21 0700)   PRN Meds:.sodium chloride, albumin human, dextrose, midazolam, morphine injection, ondansetron (ZOFRAN) IV, oxyCODONE, sodium chloride flush, traMADol  General appearance: alert, cooperative, and no distress Neurologic: intact Heart: RRR Lungs: Diminished bibasilar breath sounds Abdomen: Semi soft, obese, rare  bowel sounds Extremities: Bilateral LE edema Wound: RLE wound dressing partially removed and wound is clean and dry. Aquacel intact sternal wound  Lab Results: CBC: Recent Labs    11/13/21 1613 11/13/21 1615 11/14/21 0500 11/14/21 0506  WBC 28.3*  --  21.8*  --   HGB 10.2*   < > 9.4* 9.9*  HCT 31.3*   < > 28.7* 29.0*  PLT 192  --  160  --    < > = values in this interval not displayed.   BMET:  Recent Labs    11/13/21 1613 11/13/21 1615 11/14/21 0500 11/14/21 0506  NA 134*   < > 132* 131*  K 4.8   < > 4.6 4.7  CL 104  --  99  --   CO2 18*  --  18*  --   GLUCOSE 267*  --  449*  --   BUN 28*  --  33*  --   CREATININE 2.60*  --  3.74*  --    CALCIUM 7.5*  --  7.1*  --    < > = values in this interval not displayed.    CMET: Lab Results  Component Value Date   WBC 21.8 (H) 11/14/2021   HGB 9.9 (L) 11/14/2021   HCT 29.0 (L) 11/14/2021   PLT 160 11/14/2021   GLUCOSE 449 (H) 11/14/2021   ALT 9 11/14/2021   AST 29 11/14/2021   NA 131 (L) 11/14/2021   K 4.7 11/14/2021   CL 99 11/14/2021   CREATININE 3.74 (H) 11/14/2021   BUN 33 (H) 11/14/2021   CO2 18 (L) 11/14/2021   INR 1.4 (H) 11/12/2021   HGBA1C 9.3 (H) 11/08/2021      PT/INR:  Recent Labs    11/12/21 1504  LABPROT 17.3*  INR 1.4*   Radiology: DG Abd 1 View  Result Date: 11/13/2021 CLINICAL DATA:  NG tube placement EXAM: ABDOMEN - 1 VIEW COMPARISON:  11/13/2021 FINDINGS: Enteric tube tip is localized to the right upper quadrant consistent with location in the distal stomach. Gas-filled mildly distended small bowel as seen previously. IMPRESSION: Enteric tube tip is in the right upper quadrant consistent with location in the distal stomach. Electronically Signed   By: Lucienne Capers M.D.   On: 11/13/2021 18:04   DG Abd Portable 1V  Result Date: 11/13/2021 CLINICAL DATA:  Ileus EXAM: PORTABLE ABDOMEN - 1 VIEW COMPARISON:  None Available. FINDINGS: Gas in the stomach, small bowel, and colon. Mild bowel distension. No air in the rectum. Small amount of stool in the colon. Negative skeletal structures. IMPRESSION: Mild bowel distension consistent with ileus. Electronically Signed   By: Franchot Gallo M.D.   On: 11/13/2021 17:30   ECHOCARDIOGRAM COMPLETE  Result Date: 11/13/2021    ECHOCARDIOGRAM REPORT   Patient Name:   [name] [name] Date of Exam: 11/13/2021 Medical Rec #:  [id number]    Height:       70.0 in Accession #:    [id number]   Weight:       295.6 lb Date of Birth:  08-Dec-1955   BSA:          2.464 m Patient Age:    66 years     BP:           126/62 mmHg Patient Gender: M            HR:           87 bpm. Exam Location:  Inpatient Procedure: 2D Echo,  Cardiac  Doppler, Color Doppler and Intracardiac            Opacification Agent Indications:    CHF  History:        Patient has prior history of Echocardiogram examinations, most                 recent 07/30/2021. CHF, Arrythmias:Bradycardia; Risk                 Factors:Hypertension, Dyslipidemia and Diabetes. S/p CABG x 3.  Sonographer:    Luisa Hart RDCS Referring Phys: 31 LINDSAY NICOLE [facility]  Sonographer Comments: Patient is morbidly obese and Technically difficult study due to poor echo windows. Image acquisition challenging due to patient body habitus. IMPRESSIONS  1. Left ventricular ejection fraction, by estimation, is 55 to 60%. The left ventricle has normal function. Left ventricular endocardial border not optimally defined to evaluate regional wall motion. Left ventricular diastolic parameters are consistent with Grade II diastolic dysfunction (pseudonormalization).  2. Peak RV-RA gradient 28 mmHg. Right ventricular systolic function was not well visualized. The right ventricular size is not well visualized.  3. The mitral valve was not well visualized. No evidence of mitral valve regurgitation. No evidence of mitral stenosis.  4. The aortic valve is tricuspid. There is mild calcification of the aortic valve. Aortic valve regurgitation is not visualized. Aortic valve sclerosis is present, with no evidence of aortic valve stenosis.  5. No significant pericardial effusion.  6. The heart is poorly visualized. Grossly, LV systolic function appears to be in the normal range. FINDINGS  Left Ventricle: Left ventricular ejection fraction, by estimation, is 55 to 60%. The left ventricle has normal function. Left ventricular endocardial border not optimally defined to evaluate regional wall motion. The left ventricular internal cavity size was normal in size. There is no left ventricular hypertrophy. Left ventricular diastolic parameters are consistent with Grade II diastolic dysfunction (pseudonormalization). Right  Ventricle: Peak RV-RA gradient 28 mmHg. The right ventricular size is not well visualized. Right vetricular wall thickness was not well visualized. Right ventricular systolic function was not well visualized. Left Atrium: Left atrial size was not well visualized. Right Atrium: Right atrial size was not well visualized. Pericardium: There is no evidence of pericardial effusion. Mitral Valve: The mitral valve was not well visualized. No evidence of mitral valve regurgitation. No evidence of mitral valve stenosis. MV peak gradient, 9.1 mmHg. The mean mitral valve gradient is 3.0 mmHg. Tricuspid Valve: The tricuspid valve is normal in structure. Tricuspid valve regurgitation is trivial. Aortic Valve: The aortic valve is tricuspid. There is mild calcification of the aortic valve. Aortic valve regurgitation is not visualized. Aortic valve sclerosis is present, with no evidence of aortic valve stenosis. Aortic valve mean gradient measures 2.5 mmHg. Aortic valve peak gradient measures 4.8 mmHg. Aortic valve area, by VTI measures 6.12 cm. Pulmonic Valve: The pulmonic valve was normal in structure. Pulmonic valve regurgitation is not visualized. Aorta: The aortic root is normal in size and structure. Venous: The inferior vena cava was not well visualized. IAS/Shunts: The interatrial septum was not well visualized.  LEFT VENTRICLE PLAX 2D LVIDd:         5.10 cm   Diastology LVIDs:         4.00 cm   LV e' medial:    5.92 cm/s LV PW:         1.20 cm   LV E/e' medial:  27.2 LV IVS:        1.10  cm   LV e' lateral:   7.22 cm/s LVOT diam:     2.60 cm   LV E/e' lateral: 22.3 LV SV:         102 LV SV Index:   42 LVOT Area:     5.31 cm  RIGHT VENTRICLE RV S prime:     10.10 cm/s LEFT ATRIUM         Index LA diam:    2.70 cm 1.10 cm/m  AORTIC VALVE                    PULMONIC VALVE AV Area (Vmax):    5.36 cm     PV Vmax:       1.02 m/s AV Area (Vmean):   5.17 cm     PV Vmean:      67.500 cm/s AV Area (VTI):     6.12 cm     PV VTI:         0.144 m AV Vmax:           109.00 cm/s  PV Peak grad:  4.2 mmHg AV Vmean:          74.650 cm/s  PV Mean grad:  2.0 mmHg AV VTI:            0.168 m AV Peak Grad:      4.8 mmHg AV Mean Grad:      2.5 mmHg LVOT Vmax:         110.00 cm/s LVOT Vmean:        72.700 cm/s LVOT VTI:          0.193 m LVOT/AV VTI ratio: 1.15  AORTA Ao Root diam: 3.70 cm Ao Asc diam:  3.60 cm MITRAL VALVE                TRICUSPID VALVE MV Area (PHT): 3.91 cm     TR Peak grad:   28.5 mmHg MV Area VTI:   2.44 cm     TR Vmax:        267.00 cm/s MV Peak grad:  9.1 mmHg MV Mean grad:  3.0 mmHg     SHUNTS MV Vmax:       1.51 m/s     Systemic VTI:  0.19 m MV Vmean:      80.3 cm/s    Systemic Diam: 2.60 cm MV Decel Time: 194 msec MV E velocity: 161.00 cm/s MV A velocity: 52.60 cm/s MV E/A ratio:  3.06 Dalton McleanMD Electronically signed by Franki Monte Signature Date/Time: 11/13/2021/3:42:40 PM    Final    VAS [place] LOWER EXTREMITY VENOUS (DVT)  Result Date: 11/13/2021  Lower Venous DVT Study Patient Name:  [name] [name]  Date of Exam:   11/13/2021 Medical Rec #: [id number]     Accession #:    [id number] Date of Birth: Mar 22, 1956    Patient Gender: M Patient Age:   6 years Exam Location:  [facility] Procedure:      VAS [place] LOWER EXTREMITY VENOUS (DVT) Referring Phys: Ina Homes --------------------------------------------------------------------------------  Indications: Edema.  Risk Factors: Surgery S/P CABGx3 on 11/12/21. Limitations: Body habitus, poor ultrasound/tissue interface and bandages. Comparison Study: No previous exams Performing Technologist: Jody Hill RVT, RDMS  Examination Guidelines: A complete evaluation includes B-mode imaging, spectral Doppler, color Doppler, and power Doppler as needed of all accessible portions of each vessel. Bilateral testing is considered an integral part of a complete examination. Limited examinations for reoccurring indications may  be performed as noted. The reflux portion of the exam is  performed with the patient in reverse Trendelenburg.  +--------+---------------+---------+-----------+----------+--------------------+ RIGHT   CompressibilityPhasicitySpontaneityPropertiesThrombus Aging       +--------+---------------+---------+-----------+----------+--------------------+ CFV                    Yes      Yes                  patent by                                                                 color/doppler        +--------+---------------+---------+-----------+----------+--------------------+ SFJ                                                  patent by color      +--------+---------------+---------+-----------+----------+--------------------+ FV Prox Full           Yes      Yes                                       +--------+---------------+---------+-----------+----------+--------------------+ FV Mid  Full           Yes      Yes                                       +--------+---------------+---------+-----------+----------+--------------------+ FV      Full           Yes      Yes                                       Distal                                                                    +--------+---------------+---------+-----------+----------+--------------------+ PFV     Full                                                              +--------+---------------+---------+-----------+----------+--------------------+ POP     Full           Yes      Yes                                       +--------+---------------+---------+-----------+----------+--------------------+ PTV     Full                                                              +--------+---------------+---------+-----------+----------+--------------------+  PERO    Full                                                              +--------+---------------+---------+-----------+----------+--------------------+   Right Technical Findings: Very  limited visualization of CFV & SFJ due to habitus. Limited visualzation of calf vessels due to bandage from vein harvest.  +--------+---------------+---------+-----------+----------+--------------------+ LEFT    CompressibilityPhasicitySpontaneityPropertiesThrombus Aging       +--------+---------------+---------+-----------+----------+--------------------+ CFV                                                  Not visualized       +--------+---------------+---------+-----------+----------+--------------------+ SFJ                                                  Not visualized       +--------+---------------+---------+-----------+----------+--------------------+ FV Prox Full           Yes      Yes                                       +--------+---------------+---------+-----------+----------+--------------------+ FV Mid  Full           Yes      Yes                                       +--------+---------------+---------+-----------+----------+--------------------+ FV      Full           Yes      Yes                                       Distal                                                                    +--------+---------------+---------+-----------+----------+--------------------+ PFV                    Yes      Yes                  patent by                                                                 color/doppler        +--------+---------------+---------+-----------+----------+--------------------+ POP  Full           Yes      Yes                                       +--------+---------------+---------+-----------+----------+--------------------+ PTV     Full                                                              +--------+---------------+---------+-----------+----------+--------------------+ PERO    Full                                                               +--------+---------------+---------+-----------+----------+--------------------+   Left Technical Findings: Not visualized segments include CFV and SFJ (bandage in groin area).   Summary: BILATERAL: -No evidence of popliteal cyst, bilaterally. RIGHT: - There is no evidence of deep vein thrombosis in the lower extremity. However, portions of this examination were limited- see technologist comments above.  LEFT: - There is no evidence of deep vein thrombosis in the lower extremity. However, portions of this examination were limited- see technologist comments above.  *See table(s) above for measurements and observations. Electronically signed by Harold Barban MD on 11/13/2021 at 8:53:17 PM.    Final      Assessment/Plan: S/P Procedure(s) (LRB): CORONARY ARTERY BYPASS GRAFTING (CABG) x  3 USING LEFT INTERNAL MAMMARY ARTERY AND RIGHT GREATER SAPHENOUS VEIN (N/A) MAZE (N/A) TRANSESOPHAGEAL ECHOCARDIOGRAM (TEE) (N/A) ENDOVEIN HARVEST OF GREATER SAPHENOUS VEIN CV-History of persistent atrial fibrillation. Had MAZE. PVCs this am. Will need anticoagulation;will discuss when to start (still has wires and chest tubes). On Milrinone 0.25, Nor epinephrine, Amiodarone, and Vasopressin  drips. Co ox this am up to 70.4.  Pulmonary-History of OSA on CPAP. Chest tubes with 435 cc last 24 hours. Given bi carb;on drip. ABG results noted. CXR this am appears stable. Continue Breo Ellipta and Xopenex. Encourage incentive spirometer and flutter valve. 3. Acute on chronic CHF-on Lasix drip. Appreciate advanced heart failure's assistance 4. Expected post op blood loss anemia-H and H this am decreased to 9.9 and 29 5. DM-CBGs 146/229/442. On Insulin but will increase for better glucose control. Pre op HGA1C 9.3. Will not restart Metformin or Glipizide yet as creatinine elevated. Will need close medical follow up after discharge 6. AKI on CKD-Creatinine increased to 3.74. He did not tolerate initiation of Dopamine (tachycardia,  hypotension). Avoid nephro toxic agents. May need nephrology consult if creatinine worsens. 7. Leukocytosis, afebrile-WBC decreased to 21,800. Pro calcitonin this am 1.83. Lactic acid 2.3. Will discuss with Dr. Prescott Gum if needs empiric antibiotic 8. GI-likely ileus. NPO. NGT with 350 cc. Limit narcotics. 9. Duplex US of LE showed no evidence of DVT  Donielle Liston Alba PA-C 11/14/2021 7:26 AM   Up in chair with NG in place for ileus- helping to remove fluid Post op AKI  stage 4, will cont iv lasix and vasopressin to support MAP Nsr postop Maze Coox > 60%  on Mil, NE Will Dc chest tubes later today Pan culture  and start Maxepime for abnormal sepsis markers Low bicarb related to renal dz, CBG > 400, resume glucommander  patient examined and medical record reviewed,agree with above note. Dahlia Byes 11/14/2021

## 2021-11-14 NOTE — Progress Notes (Addendum)
Pharmacy Antibiotic Note  [name] [name] is a 66 y.o. male admitted on 11/12/2021 with CAD now s/p CABG on 5/23. 5/24 patient developed post-cardiotomy shock, now with possibility of an infectious component.  Pharmacy has been consulted for vancomycin and cefepime dosing.  5/24: LA 3.2, PCT 0.7>1.83, WBC 21.8 (staying in 20s), Scr 2.6>3.74 (minimal UOP on milrinone and pressors), tachycardic, afebrile   Plan: Cefepime 1g Q12H >>Cefepime 2g IV Q12H (calculated CrCl ~34 using actual bodyweight)  Vancomycin dosing per unstable renal function  Monitor renal function, C/S, duration of therapy, and de-escalation  F/U AM VR   Height: 5\' 10"  (177.8 cm) Weight: 123.6 kg (272 lb 7.8 oz) IBW/kg (Calculated) : 73  Temp (24hrs), Avg:98.1 F (36.7 C), Min:97.3 F (36.3 C), Max:99 F (37.2 C)  Recent Labs  Lab 11/12/21 1338 11/12/21 1504 11/12/21 2057 11/13/21 0528 11/13/21 1613 11/14/21 0149 11/14/21 0437 11/14/21 0500 11/14/21 1039 11/14/21 1147  WBC  --  19.4* 18.3* 22.3* 28.3*  --   --  21.8*  --   --   CREATININE 1.30*  --  1.45* 1.87* 2.60*  --   --  3.74*  --   --   LATICACIDVEN  --   --   --   --   --  3.5* 2.3*  --   --  3.2*  VANCOTROUGH  --   --   --   --   --   --   --   --  20  --     Estimated Creatinine Clearance: 25.3 mL/min (A) (by C-G formula based on SCr of 3.74 mg/dL (H)).    No Known Allergies  Antimicrobials this admission: 5/23-5/25 periop cefazolin  Cefepime 5/25>> Vancomycin 5/23>>   Dose adjustments this admission: N/A  Microbiology results: 5/25 BCx: sent  Adria Dill, PharmD PGY-1 Acute Care Resident  11/14/2021 3:20 PM

## 2021-11-14 NOTE — Progress Notes (Addendum)
Advanced Heart Failure Rounding Note  PCP-Cardiologist: None   Subjective:    Remains on milrinone 0.25 + dual pressors, NE 16, VP 0.03. MAPs 70s-80s   Co-ox improved 58>>70%   AF. WBC elevated 22K. PCT elevated, 0.70>>1.83   Lactic acid 3.5>>2.3. Now on bicarb gtt   CXR and abd plain film pending   Poor UOP despite lasix gtt, currently running at 10/hr. Only 435 cc out yesterday. CVP 17. Doubt wts are accurate.   SCr 2.60>>3.74. K ok at 4.7   OOB, sitting up in chair. Complains of post op soreness near sternotomy. No other complaints.   Echo: Difficult study. LV EF 55-60%, RV probably normal, no pericardial effusion.    Objective:   Weight Range: 123.6 kg Body mass index is 39.1 kg/m.   Vital Signs:   Temp:  [96.4 F (35.8 C)-98.8 F (37.1 C)] 98.4 F (36.9 C) (05/25 0700) Pulse Rate:  [78-100] 87 (05/25 0700) Resp:  [11-29] 19 (05/25 0700) BP: (88-141)/(39-88) 122/62 (05/25 0700) SpO2:  [89 %-98 %] 98 % (05/25 0700) Arterial Line BP: (80-179)/(38-82) 139/52 (05/25 0700) Weight:  [123.6 kg] 123.6 kg (05/25 0630) Last BM Date :  (PTA)  Weight change: Filed Weights   11/12/21 0602 11/13/21 0222 11/14/21 0630  Weight: 127.1 kg 134.1 kg 123.6 kg    Intake/Output:   Intake/Output Summary (Last 24 hours) at 11/14/2021 0801 Last data filed at 11/14/2021 0700 Gross per 24 hour  Intake 3136.62 ml  Output 1144 ml  Net 1992.62 ml      Physical Exam    CVP 17  General:  obese, sitting up in chair. No resp difficulty HEENT: Normal Neck: Supple. Thick neck, JVD not well visualized, Rt IJ Swan . Carotids 2+ bilat; no bruits. No lymphadenopathy or thyromegaly appreciated. Cor: PMI nondisplaced. Regular rate & rhythm. + sternal dressing  Lungs: decreased BS at the bases bilaterally  Abdomen: obese/ distended, nontender No hepatosplenomegaly. No bruits or masses. Good bowel sounds. Extremities: No cyanosis, clubbing, rash, edema + cool distal extremities   Neuro: Alert & orientedx3, cranial nerves grossly intact. moves all 4 extremities w/o difficulty. Affect pleasant   Telemetry   NSR 90s   EKG    N/A   Labs    CBC Recent Labs    11/13/21 1613 11/13/21 1615 11/14/21 0500 11/14/21 0506  WBC 28.3*  --  21.8*  --   HGB 10.2*   < > 9.4* 9.9*  HCT 31.3*   < > 28.7* 29.0*  MCV 95.4  --  94.4  --   PLT 192  --  160  --    < > = values in this interval not displayed.   Basic Metabolic Panel Recent Labs    11/13/21 0528 11/13/21 0805 11/13/21 1613 11/13/21 1615 11/14/21 0500 11/14/21 0506  NA 136   < > 134*   < > 132* 131*  K 4.2   < > 4.8   < > 4.6 4.7  CL 107  --  104  --  99  --   CO2 20*  --  18*  --  18*  --   GLUCOSE 155*  --  267*  --  449*  --   BUN 25*  --  28*  --  33*  --   CREATININE 1.87*  --  2.60*  --  3.74*  --   CALCIUM 7.1*  --  7.5*  --  7.1*  --   MG 2.7*  --  2.6*  --   --   --    < > = values in this interval not displayed.   Liver Function Tests Recent Labs    11/14/21 0500  AST 29  ALT 9  ALKPHOS 32*  BILITOT 1.0  PROT 6.0*  ALBUMIN 3.8   No results for input(s): LIPASE, AMYLASE in the last 72 hours. Cardiac Enzymes No results for input(s): CKTOTAL, CKMB, CKMBINDEX, TROPONINI in the last 72 hours.  BNP: BNP (last 3 results) No results for input(s): BNP in the last 8760 hours.  ProBNP (last 3 results) No results for input(s): PROBNP in the last 8760 hours.   D-Dimer No results for input(s): DDIMER in the last 72 hours. Hemoglobin A1C No results for input(s): HGBA1C in the last 72 hours. Fasting Lipid Panel No results for input(s): CHOL, HDL, LDLCALC, TRIG, CHOLHDL, LDLDIRECT in the last 72 hours. Thyroid Function Tests No results for input(s): TSH, T4TOTAL, T3FREE, THYROIDAB in the last 72 hours.  Invalid input(s): FREET3  Other results:   Imaging    DG Abd 1 View  Result Date: 11/13/2021 CLINICAL DATA:  NG tube placement EXAM: ABDOMEN - 1 VIEW COMPARISON:   11/13/2021 FINDINGS: Enteric tube tip is localized to the right upper quadrant consistent with location in the distal stomach. Gas-filled mildly distended small bowel as seen previously. IMPRESSION: Enteric tube tip is in the right upper quadrant consistent with location in the distal stomach. Electronically Signed   By: Lucienne Capers M.D.   On: 11/13/2021 18:04   DG Abd Portable 1V  Result Date: 11/13/2021 CLINICAL DATA:  Ileus EXAM: PORTABLE ABDOMEN - 1 VIEW COMPARISON:  None Available. FINDINGS: Gas in the stomach, small bowel, and colon. Mild bowel distension. No air in the rectum. Small amount of stool in the colon. Negative skeletal structures. IMPRESSION: Mild bowel distension consistent with ileus. Electronically Signed   By: Franchot Gallo M.D.   On: 11/13/2021 17:30   ECHOCARDIOGRAM COMPLETE  Result Date: 11/13/2021    ECHOCARDIOGRAM REPORT   Patient Name:   [name] [name] Date of Exam: 11/13/2021 Medical Rec #:  [id number]    Height:       70.0 in Accession #:    [id number]   Weight:       295.6 lb Date of Birth:  25-May-1956   BSA:          2.464 m Patient Age:    66 years     BP:           126/62 mmHg Patient Gender: M            HR:           87 bpm. Exam Location:  Inpatient Procedure: 2D Echo, Cardiac Doppler, Color Doppler and Intracardiac            Opacification Agent Indications:    CHF  History:        Patient has prior history of Echocardiogram examinations, most                 recent 07/30/2021. CHF, Arrythmias:Bradycardia; Risk                 Factors:Hypertension, Dyslipidemia and Diabetes. S/p CABG x 3.  Sonographer:    Luisa Hart RDCS Referring Phys: 75 LINDSAY NICOLE [facility]  Sonographer Comments: Patient is morbidly obese and Technically difficult study due to poor echo windows. Image acquisition challenging due to patient body habitus. IMPRESSIONS  1. Left ventricular ejection  fraction, by estimation, is 55 to 60%. The left ventricle has normal function. Left ventricular  endocardial border not optimally defined to evaluate regional wall motion. Left ventricular diastolic parameters are consistent with Grade II diastolic dysfunction (pseudonormalization).  2. Peak RV-RA gradient 28 mmHg. Right ventricular systolic function was not well visualized. The right ventricular size is not well visualized.  3. The mitral valve was not well visualized. No evidence of mitral valve regurgitation. No evidence of mitral stenosis.  4. The aortic valve is tricuspid. There is mild calcification of the aortic valve. Aortic valve regurgitation is not visualized. Aortic valve sclerosis is present, with no evidence of aortic valve stenosis.  5. No significant pericardial effusion.  6. The heart is poorly visualized. Grossly, LV systolic function appears to be in the normal range. FINDINGS  Left Ventricle: Left ventricular ejection fraction, by estimation, is 55 to 60%. The left ventricle has normal function. Left ventricular endocardial border not optimally defined to evaluate regional wall motion. The left ventricular internal cavity size was normal in size. There is no left ventricular hypertrophy. Left ventricular diastolic parameters are consistent with Grade II diastolic dysfunction (pseudonormalization). Right Ventricle: Peak RV-RA gradient 28 mmHg. The right ventricular size is not well visualized. Right vetricular wall thickness was not well visualized. Right ventricular systolic function was not well visualized. Left Atrium: Left atrial size was not well visualized. Right Atrium: Right atrial size was not well visualized. Pericardium: There is no evidence of pericardial effusion. Mitral Valve: The mitral valve was not well visualized. No evidence of mitral valve regurgitation. No evidence of mitral valve stenosis. MV peak gradient, 9.1 mmHg. The mean mitral valve gradient is 3.0 mmHg. Tricuspid Valve: The tricuspid valve is normal in structure. Tricuspid valve regurgitation is trivial. Aortic  Valve: The aortic valve is tricuspid. There is mild calcification of the aortic valve. Aortic valve regurgitation is not visualized. Aortic valve sclerosis is present, with no evidence of aortic valve stenosis. Aortic valve mean gradient measures 2.5 mmHg. Aortic valve peak gradient measures 4.8 mmHg. Aortic valve area, by VTI measures 6.12 cm. Pulmonic Valve: The pulmonic valve was normal in structure. Pulmonic valve regurgitation is not visualized. Aorta: The aortic root is normal in size and structure. Venous: The inferior vena cava was not well visualized. IAS/Shunts: The interatrial septum was not well visualized.  LEFT VENTRICLE PLAX 2D LVIDd:         5.10 cm   Diastology LVIDs:         4.00 cm   LV e' medial:    5.92 cm/s LV PW:         1.20 cm   LV E/e' medial:  27.2 LV IVS:        1.10 cm   LV e' lateral:   7.22 cm/s LVOT diam:     2.60 cm   LV E/e' lateral: 22.3 LV SV:         102 LV SV Index:   42 LVOT Area:     5.31 cm  RIGHT VENTRICLE RV S prime:     10.10 cm/s LEFT ATRIUM         Index LA diam:    2.70 cm 1.10 cm/m  AORTIC VALVE                    PULMONIC VALVE AV Area (Vmax):    5.36 cm     PV Vmax:       1.02 m/s AV Area (Vmean):  5.17 cm     PV Vmean:      67.500 cm/s AV Area (VTI):     6.12 cm     PV VTI:        0.144 m AV Vmax:           109.00 cm/s  PV Peak grad:  4.2 mmHg AV Vmean:          74.650 cm/s  PV Mean grad:  2.0 mmHg AV VTI:            0.168 m AV Peak Grad:      4.8 mmHg AV Mean Grad:      2.5 mmHg LVOT Vmax:         110.00 cm/s LVOT Vmean:        72.700 cm/s LVOT VTI:          0.193 m LVOT/AV VTI ratio: 1.15  AORTA Ao Root diam: 3.70 cm Ao Asc diam:  3.60 cm MITRAL VALVE                TRICUSPID VALVE MV Area (PHT): 3.91 cm     TR Peak grad:   28.5 mmHg MV Area VTI:   2.44 cm     TR Vmax:        267.00 cm/s MV Peak grad:  9.1 mmHg MV Mean grad:  3.0 mmHg     SHUNTS MV Vmax:       1.51 m/s     Systemic VTI:  0.19 m MV Vmean:      80.3 cm/s    Systemic Diam: 2.60 cm MV Decel  Time: 194 msec MV E velocity: 161.00 cm/s MV A velocity: 52.60 cm/s MV E/A ratio:  3.06 Orley Lawry McleanMD Electronically signed by Franki Monte Signature Date/Time: 11/13/2021/3:42:40 PM    Final    VAS [place] LOWER EXTREMITY VENOUS (DVT)  Result Date: 11/13/2021  Lower Venous DVT Study Patient Name:  [name] [name]  Date of Exam:   11/13/2021 Medical Rec #: [id number]     Accession #:    [id number] Date of Birth: April 30, 1956    Patient Gender: M Patient Age:   34 years Exam Location:  [facility] Procedure:      VAS [place] LOWER EXTREMITY VENOUS (DVT) Referring Phys: Ina Homes --------------------------------------------------------------------------------  Indications: Edema.  Risk Factors: Surgery S/P CABGx3 on 11/12/21. Limitations: Body habitus, poor ultrasound/tissue interface and bandages. Comparison Study: No previous exams Performing Technologist: Jody Hill RVT, RDMS  Examination Guidelines: A complete evaluation includes B-mode imaging, spectral Doppler, color Doppler, and power Doppler as needed of all accessible portions of each vessel. Bilateral testing is considered an integral part of a complete examination. Limited examinations for reoccurring indications may be performed as noted. The reflux portion of the exam is performed with the patient in reverse Trendelenburg.  +--------+---------------+---------+-----------+----------+--------------------+ RIGHT   CompressibilityPhasicitySpontaneityPropertiesThrombus Aging       +--------+---------------+---------+-----------+----------+--------------------+ CFV                    Yes      Yes                  patent by  color/doppler        +--------+---------------+---------+-----------+----------+--------------------+ SFJ                                                  patent by color       +--------+---------------+---------+-----------+----------+--------------------+ FV Prox Full           Yes      Yes                                       +--------+---------------+---------+-----------+----------+--------------------+ FV Mid  Full           Yes      Yes                                       +--------+---------------+---------+-----------+----------+--------------------+ FV      Full           Yes      Yes                                       Distal                                                                    +--------+---------------+---------+-----------+----------+--------------------+ PFV     Full                                                              +--------+---------------+---------+-----------+----------+--------------------+ POP     Full           Yes      Yes                                       +--------+---------------+---------+-----------+----------+--------------------+ PTV     Full                                                              +--------+---------------+---------+-----------+----------+--------------------+ PERO    Full                                                              +--------+---------------+---------+-----------+----------+--------------------+   Right Technical Findings: Very limited visualization of CFV & SFJ due to habitus. Limited  visualzation of calf vessels due to bandage from vein harvest.  +--------+---------------+---------+-----------+----------+--------------------+ LEFT    CompressibilityPhasicitySpontaneityPropertiesThrombus Aging       +--------+---------------+---------+-----------+----------+--------------------+ CFV                                                  Not visualized       +--------+---------------+---------+-----------+----------+--------------------+ SFJ                                                  Not visualized        +--------+---------------+---------+-----------+----------+--------------------+ FV Prox Full           Yes      Yes                                       +--------+---------------+---------+-----------+----------+--------------------+ FV Mid  Full           Yes      Yes                                       +--------+---------------+---------+-----------+----------+--------------------+ FV      Full           Yes      Yes                                       Distal                                                                    +--------+---------------+---------+-----------+----------+--------------------+ PFV                    Yes      Yes                  patent by                                                                 color/doppler        +--------+---------------+---------+-----------+----------+--------------------+ POP     Full           Yes      Yes                                       +--------+---------------+---------+-----------+----------+--------------------+ PTV     Full                                                              +--------+---------------+---------+-----------+----------+--------------------+  PERO    Full                                                              +--------+---------------+---------+-----------+----------+--------------------+   Left Technical Findings: Not visualized segments include CFV and SFJ (bandage in groin area).   Summary: BILATERAL: -No evidence of popliteal cyst, bilaterally. RIGHT: - There is no evidence of deep vein thrombosis in the lower extremity. However, portions of this examination were limited- see technologist comments above.  LEFT: - There is no evidence of deep vein thrombosis in the lower extremity. However, portions of this examination were limited- see technologist comments above.  *See table(s) above for measurements and observations. Electronically signed by  Harold Barban MD on 11/13/2021 at 8:53:17 PM.    Final      Medications:     Scheduled Medications:  acetaminophen  1,000 mg Oral Q6H   Or   acetaminophen (TYLENOL) oral liquid 160 mg/5 mL  1,000 mg Per Tube Q6H   aspirin EC  325 mg Oral Daily   Or   aspirin  324 mg Per Tube Daily   bisacodyl  10 mg Oral Daily   Or   bisacodyl  10 mg Rectal Daily   Chlorhexidine Gluconate Cloth  6 each Topical Daily   docusate sodium  200 mg Oral Daily   fluticasone furoate-vilanterol  1 puff Inhalation Daily   insulin aspart  0-24 Units Subcutaneous Q4H   insulin detemir  30 Units Subcutaneous BID   levalbuterol  1.25 mg Nebulization TID   lidocaine  2 patch Transdermal Q24H   metoCLOPramide (REGLAN) injection  10 mg Intravenous Q6H   mupirocin cream   Topical BID   pantoprazole  40 mg Oral Daily   potassium chloride  40 mEq Oral Once   rosuvastatin  20 mg Oral Daily   sodium chloride flush  3 mL Intravenous Q12H    Infusions:  sodium chloride Stopped (11/14/21 0549)   sodium chloride     sodium chloride 20 mL/hr at 11/12/21 1509   albumin human 60 mL/hr at 11/14/21 0700   albumin human     amiodarone 60 mg/hr (11/14/21 0700)   furosemide (LASIX) 200 mg in dextrose 5% 100 mL (2mg /mL) infusion 10 mg/hr (11/14/21 0700)   lactated ringers Stopped (11/13/21 2251)   lactated ringers 20 mL/hr at 11/12/21 1600   milrinone 0.25 mcg/kg/min (11/14/21 0700)   norepinephrine (LEVOPHED) Adult infusion 18 mcg/min (11/14/21 0700)    sodium bicarbonate (isotonic) infusion in sterile water 50 mL/hr at 11/14/21 0700   vasopressin 0.03 Units/min (11/14/21 0700)    PRN Medications: sodium chloride, albumin human, dextrose, midazolam, morphine injection, ondansetron (ZOFRAN) IV, oxyCODONE, sodium chloride flush, traMADol    Patient Profile   66 y.o. male with history of CM with recovered EF, persistent atrial fibrillation, DM II, CKD, sick sinus syndrome, OSA, prior smoker. Underwent CABG X 3 and  MAZE/PVI on 05/23>>post-op decrease in UOP and AKI with volume overload>>postcardiotomy shock requiring milrinone and phenylephrine .   Pre-op echo with EF 50-55%. Post-op echo very poor study but LV EF looks preserved. Unable to visualized RV. No significant pericardial effusion.   Assessment/Plan   Shock  - suspect most likely septic shock, etiology uncertain + component of cardiogenic shock  - WBC  22K. PCT elevated, 0.70>>1.83. Lactic acid 3.5>>2.3. Requiring dual pressor support - CXR and Abx X-ray pending  - Check Blood Cx - Start empiric broad spectrum abx, Vanc + cefepime  - Continue NE + VP to maintain MAP > 65  - ? Component of RV dysfunction, continue milrinone support and follow co-ox  - c/w bicarb gtt, follow lactate for clearance   Acute on chronic CHF, presumably HFpEF: -Prior CM with recovered EF -Echo 02/23: EF 50-55% -S/p CABG 05/23 -5/24 Developed postcardiotomy shock requiring milrinone and phenylephrine. Initial co-ox 58%. CVP >20  -Post-op echo very poor study but LV EF looks preserved. Unable to visualized RV. No significant pericardial effusion -Co-ox up to 70% today -Continue Milrinone 0.25 -CVP 17. Poor response to Lasix gtt. Currently 10/hr. Increase to 15/hr. If continues to diurese poorly, may need nephrology consult and initiation of CVVHD  -Titrate GDMT once off pressors  3. CAD: -s/p CABG X 3 on 05/23 -On aspirin and statin   4. Persistent atrial fibrillation: -S/p MAZE/PVI at time of CABG -Currently NSR w/ PACs  -Continue amiodarone gtt until off inotrope support -Anticoagulation when okay with TCTS   5. Oliguric AKI on CKD: -Scr 1.45>>1.87>>2.60>>3.74, baseline looks around 1.5 -decreased UOP today, watch closely.  -Avoid hypotension. Support BP w/ NE + VP -Support CO w/ milrinone  -Increase Lasix gtt to 15/hr  -May needed CVVHD if SCr continues to worsen    5. Leukocytosis: -WBC 18>22. Concern for infection. Etiology uncertain  -PCT  3.08, LA 3.5>>2.3  -CXR/ Abd X-ray pending -Check BCx -Start broad spectrum abx    6. DM II: -A1c 9.3 in 05/23 -Eventual SGLT2i   Length of Stay: 2  Lyda Jester, PA-C  11/14/2021, 8:01 AM  Advanced Heart Failure Team Pager [phone] (M-F; 7a - 5p)  Please contact [facility] for night-coverage after hours (5p -7a ) and weekends on [email or web address]  Patient seen with PA, agree with the above note.   Still with abdominal distention, NGT in place.  PCT up to 1.83, afebrile.  WBCs 22K.  Co-ox 70%, lactate coming down (2.3).  Creatinine trending up 2.6 => 3.74.  CVP 17 on Lasix gtt 10 mg/hr.  Starting to make more urine.  Currently on milrinone 0.25 + NE 14 + vasopressin 0.03, trending down on NE.   General: NAD Neck: JVP 16 cm, no thyromegaly or thyroid nodule.  Lungs: Decreased at bases.  CV: Nondisplaced PMI.  Heart regular S1/S2, no S3/S4, no murmur.  1+ edema to knees.  Abdomen: Soft, nontender, no hepatosplenomegaly, moderate distention.  Skin: Intact without lesions or rashes.  Neurologic: Alert and oriented x 3.  Psych: Normal affect. Extremities: No clubbing or cyanosis.  HEENT: Normal.   1. Shock: Echo reviewed, LV EF 55-60%, RV poorly visualized but probably near-normal, no significant pericardial effusion. Suspect sepsis physiology with distributive shock.  Lactate elevated 2.3 but coming down. PCT elevated 1.83.  Co-ox 70%.  - Continue milrinone 0.25.  - Titrating down now on NE. - Continue vasopressin for now.  - Empiric coverage with abx, starting vancomycin/cefepime.  2. Acute on chronic primarily diastolic CHF: LV EF 62-95%, RV poorly visualized but probably near-normal, no significant pericardial effusion.  Patient is volume overloaded, suspect primarily right heart failure physiology.  CVP 17.  Creatinine up to 3.74 but starting to make more urine.  - Increase Lasix gtt to 15 mg/hr.  - May need CVVH, follow closely.  3. AKI on CKD stage 3: Creatinine up to  3.74 today.  Suspect due to shock as well as elevated renal venous pressure in setting of volume overload, ileus, abdominal distention. UOP starting to pick up some.  - Maintain MAP as above.  - Lasix to 15 mg/hr.  - Decompress abdomen (has NGT).  - pm BMET.  - CVVH if renal function continues to deteriorate.  4. CAD: S/p CABG.  - Continue ASA, statin.  5. Atrial fibrillation: Has had Maze and LA appendage clip. ?sinus tachy.  - Eventually will need anticoagulation.  - ECG today.  - Continue amiodarone 60 mg/hr.  6. DM2: SSI.  7. ID: WBCs 22K, PCT 1.83.  Afebrile.  ?Septic shock.  - Will cover empirically with vancomycin/cefepime.  - Blood cultures sent.  8. Ileus: NGT.  Currently NPO.  9. Pulmonary: CXR today with probable atelectasis. He is on 7L HFNC.  Ileus with abdominal distention worsens respiratory mechanics and suspect component of pulmonary edema as well.  - Diurese.  - Decompress ileus.  - empiric abx.  - CCM following.    CRITICAL CARE Performed by: Loralie Champagne  Total critical care time: 40 minutes  Critical care time was exclusive of separately billable procedures and treating other patients.  Critical care was necessary to treat or prevent imminent or life-threatening deterioration.  Critical care was time spent personally by me on the following activities: development of treatment plan with patient and/or surrogate as well as nursing, discussions with consultants, evaluation of patient's response to treatment, examination of patient, obtaining history from patient or surrogate, ordering and performing treatments and interventions, ordering and review of laboratory studies, ordering and review of radiographic studies, pulse oximetry and re-evaluation of patient's condition.  Loralie Champagne 11/14/2021 11:00 AM

## 2021-11-14 NOTE — Progress Notes (Addendum)
Inpatient Diabetes Program Recommendations  AACE/ADA: New Consensus Statement on Inpatient Glycemic Control (2015)  Target Ranges:  Prepandial:   less than 140 mg/dL      Peak postprandial:   less than 180 mg/dL (1-2 hours)      Critically ill patients:  140 - 180 mg/dL   Lab Results  Component Value Date   GLUCAP 399 (H) 11/14/2021   HGBA1C 9.3 (H) 11/08/2021    Review of Glycemic Control  Latest Reference Range & Units 11/13/21 12:47 11/13/21 16:06 11/14/21 07:27 11/14/21 09:38  Glucose-Capillary 70 - 99 mg/dL 168 (H) 229 (H) 442 (H) 399 (H)   Diabetes history: DM 2 Outpatient Diabetes medications:  Trulicity 3 mg q Monday, Farxiga 10 mg daily, Glipizide 10 mg bid, Metformin 1000 mg bid Current orders for Inpatient glycemic control:  IV insulin Levemir 30 units bid  Inpatient Diabetes Program Recommendations:    Agree with restart of insulin drip.  Unsure why blood sugars are so elevated however note patient is still on pressors.  Will follow.  Pre-surgery A1C=9.3% (indicating average blood sugars 219 mg/dL).   Thanks,  Adah Perl, RN, BC-ADM Inpatient Diabetes Coordinator Pager [phone]  (8a-5p)

## 2021-11-14 NOTE — Progress Notes (Signed)
CT surgery PM rounds  Patient's blood sugar better controlled on IV insulin Glucomander protocol Urine output remains at 40 cc/h, p.m. creatinine plateaued at 3.6 Blood pressure has been better today and norepinephrine has been weaned down to 9 mcg/min NG tube draining bilious material, diminished bowel sounds on exam, We will check serum amylase and LFTs again in a.m.  Patient went back in atrial fibrillation getting back to bed from the chair Currently heart rate 110-120 A-fib-we will redose IV amiodarone Lovenox 40 mg prophylactic dose started.  Vitals:   11/14/21 1800 11/14/21 1808  BP:  101/64  Pulse: (!) 115 (!) 121  Resp: (!) 21 19  Temp: 99.7 F (37.6 C) 99.7 F (37.6 C)  SpO2: 95% 97%

## 2021-11-14 NOTE — Anesthesia Postprocedure Evaluation (Signed)
Anesthesia Post Note  Patient: [name] [name]  Procedure(s) Performed: CORONARY ARTERY BYPASS GRAFTING (CABG) x  3 USING LEFT INTERNAL MAMMARY ARTERY AND RIGHT GREATER SAPHENOUS VEIN (Chest) MAZE TRANSESOPHAGEAL ECHOCARDIOGRAM (TEE) ENDOVEIN HARVEST OF GREATER SAPHENOUS VEIN     Patient location during evaluation: SICU Anesthesia Type: General Level of consciousness: sedated Pain management: pain level controlled Vital Signs Assessment: post-procedure vital signs reviewed and stable Respiratory status: patient remains intubated per anesthesia plan Cardiovascular status: stable Postop Assessment: no apparent nausea or vomiting Anesthetic complications: no   No notable events documented.  Last Vitals:  Vitals:   11/14/21 0808 11/14/21 0900  BP: (!) 133/58 (!) 150/63  Pulse: 89 87  Resp: (!) 23 18  Temp: 36.8 C 36.6 C  SpO2: 94% 98%    Last Pain:  Vitals:   11/14/21 0800  TempSrc:   PainSc: 0-No pain                 Shayleigh Bouldin S

## 2021-11-14 NOTE — Progress Notes (Signed)
NAME:  [name] [name], MRN:  [id number], DOB:  1955/08/17, LOS: 2 ADMISSION DATE:  11/12/2021, CONSULTATION DATE:  11/13/2021 REFERRING MD:  Dr. Aundra Dubin , CHIEF COMPLAINT:  Respiratory distress    History of Present Illness:  [name] [name] is a 66 y.o. male with a PMH significant for but not limited to CAD now s/p CABG, systolic congestive heart failure, CKD, type 2 diabetes, HTN, A-fib chronically anticoagulated, and OSA who presented to the ED 5/23 for elective CABG with MAZE procedure Dr. Lawson Fiscal.  Patient was extubated per heart failure protocol with no incident by by afternoon of 5/24 PCCM was consulted for progressive increase work of breathing.   Pertinent  Medical History  CAD no s/p CABG, systolic congestive heart failure, CKD, type 2 diabetes, HTN, A-fib chronically anticoagulated, and OSA   Significant Hospital Events: Including procedures, antibiotic start and stop dates in addition to other pertinent events   5/23 underwent CABG with MAZE procedure with Dr. Lawson Fiscal 5/24 PCCM consulted for progressive increased work of breathing  5/25 Urine output remained low overnight along with soft BP which prompted addition of vaso and low dose bicarb drip   Interim History / Subjective:  Seen sitting up in bed with some reported back pain Denies any SOB or chest pain   Objective   Blood pressure 122/62, pulse 87, temperature 98.4 F (36.9 C), resp. rate 19, height 5\' 10"  (1.778 m), weight 123.1 kg, SpO2 98 %. PAP: (41-64)/(15-33) 61/23 CVP:  [9 mmHg-33 mmHg] 22 mmHg      Intake/Output Summary (Last 24 hours) at 11/14/2021 0711 Last data filed at 11/14/2021 0700 Gross per 24 hour  Intake 3240.26 ml  Output 874 ml  Net 2366.26 ml    Filed Weights   11/12/21 0602 11/13/21 0222 11/14/21 0630  Weight: 127.1 kg 134.1 kg 123.1 kg    Examination: General: Acute on chronically ill appearing middle aged male lying in bed in NAD HEENT: [place]/AT, MM pink/moist, PERRL,  Neuro: Alert and  oriented x3, non-focal  CV: s1s2 regular rate and rhythm, no murmur, rubs, or gallops,  PULM:  Slightly diminished, no increased work of breathing, on [place] GI: soft, bowel sounds active in all 4 quadrants, non-tender, non-distended Extremities: warm/dry, generalized edema  Skin: no rashes or lesions  Resolved Hospital Problem list     Assessment & Plan:  CAD now S/P CABG x3 -Underwent elective CABG with Dr. Lawson Fiscal 5/23 Hx of HTN/HLD  -Home medications include; Cozaar, Lopressor, and Crestor  Hx of a-fib anticoagulated at baseline P: Primary management per CTS Progress per CTS pathway  Continuous telemetry  Resume home medications when appropriate  Continue IV Amio, Lasix, Milrinone, Levo, and Vaso drips Pressors for MAP goal 65  Resume home Xarelto per CTS   At risk respiratory compromises  -felt secondary to possible pulmonary edema in the setting of third spacing and possible developing ileus  Hs of OSA with use on CPAP at baseline  P: Continue supplemental oxygen with SPO2 goal ? 90 Elevate HOB Mobilize as able  CPAP q HS  BD as scheduled  Ensure adequate pain control to avoid pulmonary splinting    History of type 2 diabetes  -Home medications include Glipizide, Metformin, and Farxiga  P: CBGs remain elevated, CTS plans to resume insulin drip  Resume home medications when appropriate    Oliguric AKI superimposed on CKD stage 3a -Creatinine 1.59 with GFR 497 11/07/2021, currently creatinine 2.6 with GFR 26 by afternoon of 5/24 P: May need  to consider Nephrology consult soon  Follow renal function  Monitor urine output Trend Bmet Avoid nephrotoxins Ensure adequate renal perfusion  IV hydration Remains on lasix drip with bicarb drip    Best Practice (right click and "Reselect all SmartList Selections" daily)   Diet/type: NPO DVT prophylaxis: SCD GI prophylaxis: PPI Lines: Central line Foley:  Yes, and it is still needed Code Status:  full code Last date  of multidisciplinary goals of care discussion: Per primary   Critical care time: 38 min  Gauge Winski D. Kenton Kingfisher, NP-C [facility] Personal contact information can be found on Amion  11/14/2021, 7:11 AM

## 2021-11-14 NOTE — Discharge Instructions (Addendum)
Discharge Instructions:  1. You may shower, please wash incisions daily with soap and water and keep dry.  If you wish to cover wounds with dressing you may do so but please keep clean and change daily.  No tub baths or swimming until incisions have completely healed.  If your incisions become red or develop any drainage please call our office at [phone]  2. No Driving until cleared by Dr. Lucianne Lei Trigt's office and you are no longer using narcotic pain medications  3. Monitor your weight daily.. Please use the same scale and weigh at same time... If you gain 5-10 lbs in 48 hours with associated lower extremity swelling, please contact our office at [phone]  4. Fever of 101.5 for at least 24 hours with no source, please contact our office at [phone]  5. Activity- up as tolerated, please walk at least 3 times per day.  Avoid strenuous activity, no lifting, pushing, or pulling with your arms over 8-10 lbs for a minimum of 6 weeks  6. If any questions or concerns arise, please do not hesitate to contact our office at [phone]   Information on my medicine - ELIQUIS (apixaban)  Why was Eliquis prescribed for you? Eliquis was prescribed for you to reduce the risk of forming blood clots that can cause a stroke if you have a medical condition called atrial fibrillation (a type of irregular heartbeat) OR to reduce the risk of a blood clots forming after orthopedic surgery.  What do You need to know about Eliquis ? Take your Eliquis TWICE DAILY - one tablet in the morning and one tablet in the evening with or without food.  It would be best to take the doses about the same time each day.  If you have difficulty swallowing the tablet whole please discuss with your pharmacist how to take the medication safely.  Take Eliquis exactly as prescribed by your doctor and DO NOT stop taking Eliquis without talking to the doctor who prescribed the medication.  Stopping may increase your risk  of developing a new clot or stroke.  Refill your prescription before you run out.  After discharge, you should have regular check-up appointments with your healthcare provider that is prescribing your Eliquis.  In the future your dose may need to be changed if your kidney function or weight changes by a significant amount or as you get older.  What do you do if you miss a dose? If you miss a dose, take it as soon as you remember on the same day and resume taking twice daily.  Do not take more than one dose of ELIQUIS at the same time.  Important Safety Information A possible side effect of Eliquis is bleeding. You should call your healthcare provider right away if you experience any of the following: Bleeding from an injury or your nose that does not stop. Unusual colored urine (red or dark brown) or unusual colored stools (red or black). Unusual bruising for unknown reasons. A serious fall or if you hit your head (even if there is no bleeding).  Some medicines may interact with Eliquis and might increase your risk of bleeding or clotting while on Eliquis. To help avoid this, consult your healthcare provider or pharmacist prior to using any new prescription or non-prescription medications, including herbals, vitamins, non-steroidal anti-inflammatory drugs (NSAIDs) and supplements.  This website has more information on Eliquis (apixaban): http://www.eliquis.com/eliquis/home

## 2021-11-15 ENCOUNTER — Inpatient Hospital Stay (HOSPITAL_COMMUNITY): Payer: Medicare HMO

## 2021-11-15 DIAGNOSIS — N179 Acute kidney failure, unspecified: Secondary | ICD-10-CM | POA: Diagnosis not present

## 2021-11-15 DIAGNOSIS — R6521 Severe sepsis with septic shock: Secondary | ICD-10-CM

## 2021-11-15 DIAGNOSIS — Z951 Presence of aortocoronary bypass graft: Secondary | ICD-10-CM | POA: Diagnosis not present

## 2021-11-15 DIAGNOSIS — A419 Sepsis, unspecified organism: Secondary | ICD-10-CM

## 2021-11-15 LAB — GLUCOSE, CAPILLARY
Glucose-Capillary: 103 mg/dL — ABNORMAL HIGH (ref 70–99)
Glucose-Capillary: 109 mg/dL — ABNORMAL HIGH (ref 70–99)
Glucose-Capillary: 124 mg/dL — ABNORMAL HIGH (ref 70–99)
Glucose-Capillary: 128 mg/dL — ABNORMAL HIGH (ref 70–99)
Glucose-Capillary: 130 mg/dL — ABNORMAL HIGH (ref 70–99)
Glucose-Capillary: 131 mg/dL — ABNORMAL HIGH (ref 70–99)
Glucose-Capillary: 137 mg/dL — ABNORMAL HIGH (ref 70–99)
Glucose-Capillary: 140 mg/dL — ABNORMAL HIGH (ref 70–99)
Glucose-Capillary: 146 mg/dL — ABNORMAL HIGH (ref 70–99)
Glucose-Capillary: 156 mg/dL — ABNORMAL HIGH (ref 70–99)
Glucose-Capillary: 168 mg/dL — ABNORMAL HIGH (ref 70–99)
Glucose-Capillary: 169 mg/dL — ABNORMAL HIGH (ref 70–99)
Glucose-Capillary: 175 mg/dL — ABNORMAL HIGH (ref 70–99)
Glucose-Capillary: 179 mg/dL — ABNORMAL HIGH (ref 70–99)
Glucose-Capillary: 182 mg/dL — ABNORMAL HIGH (ref 70–99)
Glucose-Capillary: 185 mg/dL — ABNORMAL HIGH (ref 70–99)
Glucose-Capillary: 202 mg/dL — ABNORMAL HIGH (ref 70–99)
Glucose-Capillary: 206 mg/dL — ABNORMAL HIGH (ref 70–99)

## 2021-11-15 LAB — BASIC METABOLIC PANEL
Anion gap: 13 (ref 5–15)
BUN: 37 mg/dL — ABNORMAL HIGH (ref 8–23)
CO2: 24 mmol/L (ref 22–32)
Calcium: 7.1 mg/dL — ABNORMAL LOW (ref 8.9–10.3)
Chloride: 99 mmol/L (ref 98–111)
Creatinine, Ser: 4.16 mg/dL — ABNORMAL HIGH (ref 0.61–1.24)
GFR, Estimated: 15 mL/min — ABNORMAL LOW (ref >60)
Glucose, Bld: 171 mg/dL — ABNORMAL HIGH (ref 70–99)
Potassium: 3.8 mmol/L (ref 3.5–5.1)
Sodium: 136 mmol/L (ref 135–145)

## 2021-11-15 LAB — RENAL FUNCTION PANEL
Albumin: 3.4 g/dL — ABNORMAL LOW (ref 3.5–5.0)
Anion gap: 19 — ABNORMAL HIGH (ref 5–15)
BUN: 42 mg/dL — ABNORMAL HIGH (ref 8–23)
CO2: 18 mmol/L — ABNORMAL LOW (ref 22–32)
Calcium: 6.7 mg/dL — ABNORMAL LOW (ref 8.9–10.3)
Chloride: 98 mmol/L (ref 98–111)
Creatinine, Ser: 4.61 mg/dL — ABNORMAL HIGH (ref 0.61–1.24)
GFR, Estimated: 13 mL/min — ABNORMAL LOW (ref >60)
Glucose, Bld: 171 mg/dL — ABNORMAL HIGH (ref 70–99)
Phosphorus: 5.8 mg/dL — ABNORMAL HIGH (ref 2.5–4.6)
Potassium: 3.6 mmol/L (ref 3.5–5.1)
Sodium: 135 mmol/L (ref 135–145)

## 2021-11-15 LAB — CBC
HCT: 26.5 % — ABNORMAL LOW (ref 39.0–52.0)
Hemoglobin: 8.6 g/dL — ABNORMAL LOW (ref 13.0–17.0)
MCH: 30.7 pg (ref 26.0–34.0)
MCHC: 32.5 g/dL (ref 30.0–36.0)
MCV: 94.6 fL (ref 80.0–100.0)
Platelets: 137 10*3/uL — ABNORMAL LOW (ref 150–400)
RBC: 2.8 MIL/uL — ABNORMAL LOW (ref 4.22–5.81)
RDW: 14.9 % (ref 11.5–15.5)
WBC: 16.1 10*3/uL — ABNORMAL HIGH (ref 4.0–10.5)
nRBC: 0 % (ref 0.0–0.2)

## 2021-11-15 LAB — HEPATIC FUNCTION PANEL
ALT: 7 U/L (ref 0–44)
AST: 24 U/L (ref 15–41)
Albumin: 3.5 g/dL (ref 3.5–5.0)
Alkaline Phosphatase: 39 U/L (ref 38–126)
Bilirubin, Direct: 0.1 mg/dL (ref 0.0–0.2)
Indirect Bilirubin: 0.5 mg/dL (ref 0.3–0.9)
Total Bilirubin: 0.6 mg/dL (ref 0.3–1.2)
Total Protein: 6.2 g/dL — ABNORMAL LOW (ref 6.5–8.1)

## 2021-11-15 LAB — POCT I-STAT 7, (LYTES, BLD GAS, ICA,H+H)
Acid-base deficit: 4 mmol/L — ABNORMAL HIGH (ref 0.0–2.0)
Acid-base deficit: 2 mmol/L (ref 0.0–2.0)
Bicarbonate: 22.1 mmol/L (ref 20.0–28.0)
Bicarbonate: 23.5 mmol/L (ref 20.0–28.0)
Calcium, Ion: 0.98 mmol/L — ABNORMAL LOW (ref 1.15–1.40)
Calcium, Ion: 0.98 mmol/L — ABNORMAL LOW (ref 1.15–1.40)
HCT: 26 % — ABNORMAL LOW (ref 39.0–52.0)
HCT: 25 % — ABNORMAL LOW (ref 39.0–52.0)
Hemoglobin: 8.8 g/dL — ABNORMAL LOW (ref 13.0–17.0)
Hemoglobin: 8.5 g/dL — ABNORMAL LOW (ref 13.0–17.0)
O2 Saturation: 95 %
O2 Saturation: 93 %
Patient temperature: 37.5
Patient temperature: 98.9
Potassium: 3.5 mmol/L (ref 3.5–5.1)
Potassium: 3.7 mmol/L (ref 3.5–5.1)
Sodium: 136 mmol/L (ref 135–145)
Sodium: 133 mmol/L — ABNORMAL LOW (ref 135–145)
TCO2: 23 mmol/L (ref 22–32)
TCO2: 25 mmol/L (ref 22–32)
pCO2 arterial: 43.3 mmHg (ref 32–48)
pCO2 arterial: 44.4 mmHg (ref 32–48)
pH, Arterial: 7.319 — ABNORMAL LOW (ref 7.35–7.45)
pH, Arterial: 7.332 — ABNORMAL LOW (ref 7.35–7.45)
pO2, Arterial: 83 mmHg (ref 83–108)
pO2, Arterial: 74 mmHg — ABNORMAL LOW (ref 83–108)

## 2021-11-15 LAB — COOXEMETRY PANEL
Carboxyhemoglobin: 0.3 % — ABNORMAL LOW (ref 0.5–1.5)
Methemoglobin: <0.7 % (ref 0.0–1.5)
O2 Saturation: 61.7 %
Total hemoglobin: 8.2 g/dL — ABNORMAL LOW (ref 12.0–16.0)

## 2021-11-15 LAB — AMYLASE: Amylase: 38 U/L (ref 28–100)

## 2021-11-15 LAB — VANCOMYCIN, RANDOM: Vancomycin Rm: 15

## 2021-11-15 LAB — PROCALCITONIN: Procalcitonin: 2.34 ng/mL

## 2021-11-15 MED ORDER — PRISMASOL BGK 4/2.5 32-4-2.5 MEQ/L EC SOLN: Status: DC

## 2021-11-15 MED ORDER — POTASSIUM CHLORIDE 10 MEQ/50ML IV SOLN: 10.0000 meq | INTRAVENOUS | Status: DC

## 2021-11-15 MED ORDER — VANCOMYCIN HCL IN DEXTROSE 1-5 GM/200ML-% IV SOLN
1000.0000 mg | Freq: Once | INTRAVENOUS | Status: AC
  Administered 2021-11-15: 1000 mg via INTRAVENOUS
  Filled 2021-11-15: qty 200

## 2021-11-15 MED ORDER — PRISMASOL BGK 4/2.5 32-4-2.5 MEQ/L REPLACEMENT SOLN: Status: DC

## 2021-11-15 MED ORDER — HEPARIN SODIUM (PORCINE) 1000 UNIT/ML DIALYSIS
1000.0000 [IU] | INTRAMUSCULAR | Status: DC | PRN
  Administered 2021-11-18 (×2): 2800 [IU] via INTRAVENOUS_CENTRAL
  Administered 2021-11-25: 1000 [IU] via INTRAVENOUS_CENTRAL
  Filled 2021-11-15: qty 3
  Filled 2021-11-15: qty 4
  Filled 2021-11-15: qty 3
  Filled 2021-11-15: qty 6
  Filled 2021-11-15: qty 4
  Filled 2021-11-15 (×2): qty 6
  Filled 2021-11-15: qty 4
  Filled 2021-11-15: qty 3
  Filled 2021-11-15: qty 4
  Filled 2021-11-15: qty 3

## 2021-11-15 MED ORDER — SODIUM CHLORIDE 0.9% FLUSH
10.0000 mL | Freq: Two times a day (BID) | INTRAVENOUS | Status: DC
  Administered 2021-11-15: 20 mL
  Administered 2021-11-16 – 2021-11-21 (×8): 10 mL

## 2021-11-15 MED ORDER — SODIUM CHLORIDE 0.9 % FOR CRRT: INTRAVENOUS_CENTRAL | Status: DC | PRN

## 2021-11-15 MED ORDER — INSULIN ASPART 100 UNIT/ML IJ SOLN
1.0000 [IU] | INTRAMUSCULAR | Status: DC
  Administered 2021-11-16: 2 [IU] via SUBCUTANEOUS
  Administered 2021-11-16: 3 [IU] via SUBCUTANEOUS
  Administered 2021-11-16: 1 [IU] via SUBCUTANEOUS
  Administered 2021-11-16: 2 [IU] via SUBCUTANEOUS

## 2021-11-15 MED ORDER — SODIUM CHLORIDE 0.9 % IV SOLN
2.0000 g | Freq: Two times a day (BID) | INTRAVENOUS | Status: AC
  Administered 2021-11-15 – 2021-11-19 (×9): 2 g via INTRAVENOUS
  Filled 2021-11-15 (×9): qty 12.5

## 2021-11-15 MED ORDER — METOLAZONE 5 MG PO TABS
5.0000 mg | ORAL_TABLET | Freq: Once | ORAL | Status: AC
  Administered 2021-11-15: 5 mg via ORAL
  Filled 2021-11-15: qty 1

## 2021-11-15 MED ORDER — INSULIN DETEMIR 100 UNIT/ML ~~LOC~~ SOLN
12.0000 [IU] | Freq: Two times a day (BID) | SUBCUTANEOUS | Status: DC
  Administered 2021-11-15 – 2021-11-20 (×11): 12 [IU] via SUBCUTANEOUS
  Filled 2021-11-15 (×13): qty 0.12

## 2021-11-15 MED ORDER — SODIUM CHLORIDE 0.9 % IV SOLN
1.0000 g | INTRAVENOUS | Status: DC
  Filled 2021-11-15: qty 10

## 2021-11-15 MED ORDER — AMIODARONE LOAD VIA INFUSION
150.0000 mg | Freq: Once | INTRAVENOUS | Status: AC
  Administered 2021-11-15: 150 mg via INTRAVENOUS
  Filled 2021-11-15: qty 83.34

## 2021-11-15 MED ORDER — BISACODYL 10 MG RE SUPP
10.0000 mg | Freq: Once | RECTAL | Status: DC
Start: 2021-11-15 — End: 2021-11-16

## 2021-11-15 MED ORDER — AMIODARONE HCL IN DEXTROSE 360-4.14 MG/200ML-% IV SOLN
30.0000 mg/h | INTRAVENOUS | Status: DC
  Administered 2021-11-15 – 2021-11-19 (×15): 60 mg/h via INTRAVENOUS
  Administered 2021-11-21 – 2021-11-26 (×10): 30 mg/h via INTRAVENOUS
  Filled 2021-11-15 (×6): qty 200
  Filled 2021-11-15: qty 400
  Filled 2021-11-15 (×23): qty 200

## 2021-11-15 MED ORDER — SODIUM CHLORIDE 0.9% FLUSH: 10.0000 mL | INTRAVENOUS | Status: DC | PRN

## 2021-11-15 NOTE — Progress Notes (Signed)
Inpatient Diabetes Program Recommendations  AACE/ADA: New Consensus Statement on Inpatient Glycemic Control (2015)  Target Ranges:  Prepandial:   less than 140 mg/dL      Peak postprandial:   less than 180 mg/dL (1-2 hours)      Critically ill patients:  140 - 180 mg/dL   Lab Results  Component Value Date   GLUCAP 179 (H) 11/15/2021   HGBA1C 9.3 (H) 11/08/2021    Review of Glycemic Control  Latest Reference Range & Units 11/15/21 06:26 11/15/21 07:12 11/15/21 08:24 11/15/21 09:25  Glucose-Capillary 70 - 99 mg/dL 182 (H) 137 (H) 146 (H) 179 (H)  Diabetes history: DM 2 Outpatient Diabetes medications:  Trulicity 3 mg q Monday, Farxiga 10 mg daily, Glipizide 10 mg bid, Metformin 1000 mg bid Current orders for Inpatient glycemic control:  IV insulin Levemir 30 units bid Inpatient Diabetes Program Recommendations:    Patient continues on insulin drip.  Insulin drip rates>8 units per hour.  May want to continue IV insulin while on pressors.    Thanks,  Adah Perl, RN, BC-ADM Inpatient Diabetes Coordinator Pager [phone]  (8a-5p)

## 2021-11-15 NOTE — Consult Note (Signed)
Reason for Consult: Acute kidney injury on chronic kidney disease stage IIIa Referring Physician: Mariann Laster, MD (cardiology)  HPI:  66 year old man with past medical history significant for systolic congestive heart failure, hypertension, type 2 diabetes mellitus, atrial fibrillation on chronic anticoagulation, obstructive sleep apnea and chronic kidney disease stage IIIa at baseline (creatinine ranging 1.3-1.5).  He was admitted on 11/12/2021 for elective CABG with an ACE procedure and extubated postoperatively on 5/24.  Concern is noted for possible septic shock with an element of cardiogenic shock postoperatively and worsening renal function with limited urine output in response to furosemide/ongoing inotropic support.  The patient reports some back pain in addition to his postoperative chest pain and has minimal shortness of breath.  He denies any nausea or vomiting.  Urine output overnight was 1000 cc and he is overall net +7.3 L.  There is valid concern that his current volume status may further compromise his respiratory status and nephrology is asked to see him for extracorporeal volume unloading.  Past Medical History:  Diagnosis Date   Atrial fibrillation ([place])    Benign essential HTN 07/30/2014   Bradycardia 01/25/2015   CHF (congestive heart failure) (HCC)    Chronic anticoagulation 63/14/9702   Chronic systolic CHF (congestive heart failure), NYHA class 3 ([place]) 08/30/2014   Overview:  Global ef 30%   CKD (chronic kidney disease)    Class 3 severe obesity in adult [facility]) 04/28/2017   Coronary artery disease    Diabetes mellitus without complication (HCC)    Diabetic neuropathy ([place]) 07/28/2014   Dysrhythmia    Erectile dysfunction    High risk medication use 05/26/2018   Hypertension    Hypertensive heart disease with heart failure ([place]) 07/30/2014   Lumbago    LV dysfunction 04/28/2017   Mixed hyperlipidemia 07/28/2014   OSA (obstructive sleep apnea)    CPAP    Paroxysmal atrial fibrillation ([place]) 07/28/2014   Sinus node dysfunction ([place]) 04/21/2016   Testicular hypofunction    Uncontrolled type 2 diabetes mellitus with microalbuminuric diabetic nephropathy 07/28/2014    Past Surgical History:  Procedure Laterality Date   CORONARY ARTERY BYPASS GRAFT N/A 11/12/2021   Procedure: CORONARY ARTERY BYPASS GRAFTING (CABG) x  3 USING LEFT INTERNAL MAMMARY ARTERY AND RIGHT GREATER SAPHENOUS VEIN;  Surgeon: Dahlia Byes, MD;  Location: [place];  Service: Open Heart Surgery;  Laterality: N/A;   ELECTROPHYSIOLOGIC STUDY N/A 01/18/2015   Procedure: CARDIOVERSION;  Surgeon: [name], MD;  Location: [facility];  Service: Cardiovascular;  Laterality: N/A;   ENDOVEIN HARVEST OF GREATER SAPHENOUS VEIN  11/12/2021   Procedure: ENDOVEIN HARVEST OF GREATER SAPHENOUS VEIN;  Surgeon: Dahlia Byes, MD;  Location: [place];  Service: Open Heart Surgery;;   EYE SURGERY Bilateral 2022   LEFT HEART CATH AND CORONARY ANGIOGRAPHY N/A 10/24/2021   Procedure: LEFT HEART CATH AND CORONARY ANGIOGRAPHY;  Surgeon: Jettie Booze, MD;  Location: [place] [facility];  Service: Cardiovascular;  Laterality: N/A;   MAZE N/A 11/12/2021   Procedure: MAZE;  Surgeon: Dahlia Byes, MD;  Location: [place];  Service: Open Heart Surgery;  Laterality: N/A;   TEE WITHOUT CARDIOVERSION N/A 11/12/2021   Procedure: TRANSESOPHAGEAL ECHOCARDIOGRAM (TEE);  Surgeon: Dahlia Byes, MD;  Location: [place];  Service: Open Heart Surgery;  Laterality: N/A;    Family History  Problem Relation Age of Onset   CAD Brother    Drug abuse Brother    Lung cancer Father    CAD Mother  Social History:  reports that he quit smoking about 16 years ago. His smoking use included cigarettes. He has never used smokeless tobacco. He reports current drug use. Frequency: 1.00 time per week. Drug: Marijuana. He reports that he does not drink alcohol.  Allergies: No Known Allergies  Medications: I have  reviewed the patient's current medications. Scheduled:  acetaminophen  1,000 mg Oral Q6H   Or   acetaminophen (TYLENOL) oral liquid 160 mg/5 mL  1,000 mg Per Tube Q6H   aspirin EC  325 mg Oral Daily   Or   aspirin  324 mg Per Tube Daily   bisacodyl  10 mg Oral Daily   Or   bisacodyl  10 mg Rectal Daily   bisacodyl  10 mg Rectal Once   Chlorhexidine Gluconate Cloth  6 each Topical Daily   docusate sodium  200 mg Oral Daily   enoxaparin (LOVENOX) injection  30 mg Subcutaneous Q24H   fluticasone furoate-vilanterol  1 puff Inhalation Daily   insulin detemir  30 Units Subcutaneous BID   lidocaine  2 patch Transdermal Q24H   metoCLOPramide (REGLAN) injection  10 mg Intravenous Q6H   mupirocin cream   Topical BID   neostigmine  0.25 mg Subcutaneous Q6H   pantoprazole  40 mg Oral Daily   rosuvastatin  20 mg Oral Daily   sodium chloride flush  3 mL Intravenous Q12H   vancomycin variable dose per unstable renal function (pharmacist dosing)   Does not apply See admin instructions       Latest Ref Rng & Units 11/15/2021    4:10 AM 11/15/2021    3:59 AM 11/14/2021    4:12 PM  BMP  Glucose 70 - 99 mg/dL  171   242    BUN 8 - 23 mg/dL  37   34    Creatinine 0.61 - 1.24 mg/dL  4.16   3.66    Sodium 135 - 145 mmol/L 133   136   134    Potassium 3.5 - 5.1 mmol/L 3.7   3.8   3.7    Chloride 98 - 111 mmol/L  99   100    CO2 22 - 32 mmol/L  24   20    Calcium 8.9 - 10.3 mg/dL  7.1   6.9        Latest Ref Rng & Units 11/15/2021    4:10 AM 11/15/2021    3:59 AM 11/14/2021    5:06 AM  CBC  WBC 4.0 - 10.5 K/uL  16.1     Hemoglobin 13.0 - 17.0 g/dL 8.5   8.6   9.9    Hematocrit 39.0 - 52.0 % 25.0   26.5   29.0    Platelets 150 - 400 K/uL  137      Urinalysis    Component Value Date/Time   COLORURINE STRAW (A) 11/08/2021 1110   APPEARANCEUR CLEAR 11/08/2021 1110   LABSPEC 1.023 11/08/2021 1110   PHURINE 5.0 11/08/2021 1110   GLUCOSEU >=500 (A) 11/08/2021 1110   HGBUR NEGATIVE 11/08/2021  1110   BILIRUBINUR NEGATIVE 11/08/2021 1110   KETONESUR 5 (A) 11/08/2021 1110   PROTEINUR NEGATIVE 11/08/2021 1110   NITRITE NEGATIVE 11/08/2021 1110   LEUKOCYTESUR TRACE (A) 11/08/2021 1110      DG Abd 1 View  Result Date: 11/13/2021 CLINICAL DATA:  NG tube placement EXAM: ABDOMEN - 1 VIEW COMPARISON:  11/13/2021 FINDINGS: Enteric tube tip is localized to the right upper quadrant consistent with location  in the distal stomach. Gas-filled mildly distended small bowel as seen previously. IMPRESSION: Enteric tube tip is in the right upper quadrant consistent with location in the distal stomach. Electronically Signed   By: Lucienne Capers M.D.   On: 11/13/2021 18:04   DG CHEST PORT 1 VIEW  Result Date: 11/15/2021 CLINICAL DATA:  Pneumothorax. EXAM: PORTABLE CHEST 1 VIEW COMPARISON:  Nov 14, 2021. FINDINGS: Stable cardiomegaly. Status post coronary bypass graft. Nasogastric tube is seen entering stomach. Right internal jugular venous sheath is noted. Left-sided chest tube has been removed without pneumothorax. Bibasilar atelectasis is noted. Bony thorax is unremarkable. IMPRESSION: Left-sided chest tube has been removed without pneumothorax. Stable bibasilar atelectasis. Electronically Signed   By: Marijo Conception M.D.   On: 11/15/2021 08:48   DG Chest Port 1 View  Result Date: 11/14/2021 CLINICAL DATA:  Status post coronary bypass surgery EXAM: PORTABLE CHEST 1 VIEW COMPARISON:  Previous studies including the examination of 11/13/2021 FINDINGS: Transverse diameter of heart is increased. Linear densities seen in the left lower lung fields. There are no signs of pulmonary edema. There is evidence of coronary bypass surgery. Left chest tube is noted. There is no pleural effusion or pneumothorax. Enteric tube is noted traversing the esophagus. There is interval removal of Swan-Ganz catheter. Right IJ vascular sheath is noted in place. IMPRESSION: Cardiomegaly. Linear densities in the left lower lung  fields suggest subsegmental atelectasis. Electronically Signed   By: Elmer Picker M.D.   On: 11/14/2021 08:30   DG Abd 2 Views  Result Date: 11/15/2021 CLINICAL DATA:  Ileus, abdominal distension EXAM: ABDOMEN - 2 VIEW COMPARISON:  Radiograph 11/14/2021 FINDINGS: Nasogastric tube tip and side port overlie the stomach. Slight improvement in gaseous distension of bowel small measures up to 3.0 cm, previously up to 3.5 cm. No evidence of free intraperitoneal gas on upright x-ray. IMPRESSION: Slight improvement in bowel distention in comparison to prior exam. Electronically Signed   By: Maurine Simmering M.D.   On: 11/15/2021 10:43   DG Abd Portable 1V  Result Date: 11/14/2021 CLINICAL DATA:  Abdominal distention, status post coronary artery bypass surgery EXAM: PORTABLE ABDOMEN - 1 VIEW COMPARISON:  11/13/2021 FINDINGS: Tip of enteric tube is seen in the antrum of the stomach. There is mild dilation of small-bowel loops measuring up to 3.5 cm in diameter. Gas and stool are noted in the colon. Stomach is not distended. IMPRESSION: Tip of enteric tube is seen in the antrum of the stomach. Mild dilation of small-bowel loops may suggest ileus. Electronically Signed   By: Elmer Picker M.D.   On: 11/14/2021 08:36   DG Abd Portable 1V  Result Date: 11/13/2021 CLINICAL DATA:  Ileus EXAM: PORTABLE ABDOMEN - 1 VIEW COMPARISON:  None Available. FINDINGS: Gas in the stomach, small bowel, and colon. Mild bowel distension. No air in the rectum. Small amount of stool in the colon. Negative skeletal structures. IMPRESSION: Mild bowel distension consistent with ileus. Electronically Signed   By: Franchot Gallo M.D.   On: 11/13/2021 17:30   ECHOCARDIOGRAM COMPLETE  Result Date: 11/13/2021    ECHOCARDIOGRAM REPORT   Patient Name:   Carrie Shults Date of Exam: 11/13/2021 Medical Rec #:  [id number]    Height:       70.0 in Accession #:    [id number]   Weight:       295.6 lb Date of Birth:  07/23/1955   BSA:           2.464 m Patient  Age:    65 years     BP:           126/62 mmHg Patient Gender: M            HR:           87 bpm. Exam Location:  Inpatient Procedure: 2D Echo, Cardiac Doppler, Color Doppler and Intracardiac            Opacification Agent Indications:    CHF  History:        Patient has prior history of Echocardiogram examinations, most                 recent 07/30/2021. CHF, Arrythmias:Bradycardia; Risk                 Factors:Hypertension, Dyslipidemia and Diabetes. S/p CABG x 3.  Sonographer:    Luisa Hart RDCS Referring Phys: 37 LINDSAY NICOLE [facility]  Sonographer Comments: Patient is morbidly obese and Technically difficult study due to poor echo windows. Image acquisition challenging due to patient body habitus. IMPRESSIONS  1. Left ventricular ejection fraction, by estimation, is 55 to 60%. The left ventricle has normal function. Left ventricular endocardial border not optimally defined to evaluate regional wall motion. Left ventricular diastolic parameters are consistent with Grade II diastolic dysfunction (pseudonormalization).  2. Peak RV-RA gradient 28 mmHg. Right ventricular systolic function was not well visualized. The right ventricular size is not well visualized.  3. The mitral valve was not well visualized. No evidence of mitral valve regurgitation. No evidence of mitral stenosis.  4. The aortic valve is tricuspid. There is mild calcification of the aortic valve. Aortic valve regurgitation is not visualized. Aortic valve sclerosis is present, with no evidence of aortic valve stenosis.  5. No significant pericardial effusion.  6. The heart is poorly visualized. Grossly, LV systolic function appears to be in the normal range. FINDINGS  Left Ventricle: Left ventricular ejection fraction, by estimation, is 55 to 60%. The left ventricle has normal function. Left ventricular endocardial border not optimally defined to evaluate regional wall motion. The left ventricular internal cavity size was normal in  size. There is no left ventricular hypertrophy. Left ventricular diastolic parameters are consistent with Grade II diastolic dysfunction (pseudonormalization). Right Ventricle: Peak RV-RA gradient 28 mmHg. The right ventricular size is not well visualized. Right vetricular wall thickness was not well visualized. Right ventricular systolic function was not well visualized. Left Atrium: Left atrial size was not well visualized. Right Atrium: Right atrial size was not well visualized. Pericardium: There is no evidence of pericardial effusion. Mitral Valve: The mitral valve was not well visualized. No evidence of mitral valve regurgitation. No evidence of mitral valve stenosis. MV peak gradient, 9.1 mmHg. The mean mitral valve gradient is 3.0 mmHg. Tricuspid Valve: The tricuspid valve is normal in structure. Tricuspid valve regurgitation is trivial. Aortic Valve: The aortic valve is tricuspid. There is mild calcification of the aortic valve. Aortic valve regurgitation is not visualized. Aortic valve sclerosis is present, with no evidence of aortic valve stenosis. Aortic valve mean gradient measures 2.5 mmHg. Aortic valve peak gradient measures 4.8 mmHg. Aortic valve area, by VTI measures 6.12 cm. Pulmonic Valve: The pulmonic valve was normal in structure. Pulmonic valve regurgitation is not visualized. Aorta: The aortic root is normal in size and structure. Venous: The inferior vena cava was not well visualized. IAS/Shunts: The interatrial septum was not well visualized.  LEFT VENTRICLE PLAX 2D LVIDd:  5.10 cm   Diastology LVIDs:         4.00 cm   LV e' medial:    5.92 cm/s LV PW:         1.20 cm   LV E/e' medial:  27.2 LV IVS:        1.10 cm   LV e' lateral:   7.22 cm/s LVOT diam:     2.60 cm   LV E/e' lateral: 22.3 LV SV:         102 LV SV Index:   42 LVOT Area:     5.31 cm  RIGHT VENTRICLE RV S prime:     10.10 cm/s LEFT ATRIUM         Index LA diam:    2.70 cm 1.10 cm/m  AORTIC VALVE                     PULMONIC VALVE AV Area (Vmax):    5.36 cm     PV Vmax:       1.02 m/s AV Area (Vmean):   5.17 cm     PV Vmean:      67.500 cm/s AV Area (VTI):     6.12 cm     PV VTI:        0.144 m AV Vmax:           109.00 cm/s  PV Peak grad:  4.2 mmHg AV Vmean:          74.650 cm/s  PV Mean grad:  2.0 mmHg AV VTI:            0.168 m AV Peak Grad:      4.8 mmHg AV Mean Grad:      2.5 mmHg LVOT Vmax:         110.00 cm/s LVOT Vmean:        72.700 cm/s LVOT VTI:          0.193 m LVOT/AV VTI ratio: 1.15  AORTA Ao Root diam: 3.70 cm Ao Asc diam:  3.60 cm MITRAL VALVE                TRICUSPID VALVE MV Area (PHT): 3.91 cm     TR Peak grad:   28.5 mmHg MV Area VTI:   2.44 cm     TR Vmax:        267.00 cm/s MV Peak grad:  9.1 mmHg MV Mean grad:  3.0 mmHg     SHUNTS MV Vmax:       1.51 m/s     Systemic VTI:  0.19 m MV Vmean:      80.3 cm/s    Systemic Diam: 2.60 cm MV Decel Time: 194 msec MV E velocity: 161.00 cm/s MV A velocity: 52.60 cm/s MV E/A ratio:  3.06 Dalton McleanMD Electronically signed by Franki Monte Signature Date/Time: 11/13/2021/3:42:40 PM    Final    VAS [place] LOWER EXTREMITY VENOUS (DVT)  Result Date: 11/13/2021  Lower Venous DVT Study Patient Name:  Coalton Olson  Date of Exam:   11/13/2021 Medical Rec #: [id number]     Accession #:    [id number] Date of Birth: 1955-10-04    Patient Gender: M Patient Age:   2 years Exam Location:  [facility] Procedure:      VAS [place] LOWER EXTREMITY VENOUS (DVT) Referring Phys: Ina Homes --------------------------------------------------------------------------------  Indications: Edema.  Risk Factors: Surgery S/P CABGx3 on 11/12/21. Limitations: Body habitus, poor ultrasound/tissue interface  and bandages. Comparison Study: No previous exams Performing Technologist: Jody Hill RVT, RDMS  Examination Guidelines: A complete evaluation includes B-mode imaging, spectral Doppler, color Doppler, and power Doppler as needed of all accessible portions of each vessel. Bilateral  testing is considered an integral part of a complete examination. Limited examinations for reoccurring indications may be performed as noted. The reflux portion of the exam is performed with the patient in reverse Trendelenburg.  +--------+---------------+---------+-----------+----------+--------------------+ RIGHT   CompressibilityPhasicitySpontaneityPropertiesThrombus Aging       +--------+---------------+---------+-----------+----------+--------------------+ CFV                    Yes      Yes                  patent by                                                                 color/doppler        +--------+---------------+---------+-----------+----------+--------------------+ SFJ                                                  patent by color      +--------+---------------+---------+-----------+----------+--------------------+ FV Prox Full           Yes      Yes                                       +--------+---------------+---------+-----------+----------+--------------------+ FV Mid  Full           Yes      Yes                                       +--------+---------------+---------+-----------+----------+--------------------+ FV      Full           Yes      Yes                                       Distal                                                                    +--------+---------------+---------+-----------+----------+--------------------+ PFV     Full                                                              +--------+---------------+---------+-----------+----------+--------------------+ POP     Full  Yes      Yes                                       +--------+---------------+---------+-----------+----------+--------------------+ PTV     Full                                                              +--------+---------------+---------+-----------+----------+--------------------+ PERO    Full                                                               +--------+---------------+---------+-----------+----------+--------------------+   Right Technical Findings: Very limited visualization of CFV & SFJ due to habitus. Limited visualzation of calf vessels due to bandage from vein harvest.  +--------+---------------+---------+-----------+----------+--------------------+ LEFT    CompressibilityPhasicitySpontaneityPropertiesThrombus Aging       +--------+---------------+---------+-----------+----------+--------------------+ CFV                                                  Not visualized       +--------+---------------+---------+-----------+----------+--------------------+ SFJ                                                  Not visualized       +--------+---------------+---------+-----------+----------+--------------------+ FV Prox Full           Yes      Yes                                       +--------+---------------+---------+-----------+----------+--------------------+ FV Mid  Full           Yes      Yes                                       +--------+---------------+---------+-----------+----------+--------------------+ FV      Full           Yes      Yes                                       Distal                                                                    +--------+---------------+---------+-----------+----------+--------------------+ PFV  Yes      Yes                  patent by                                                                 color/doppler        +--------+---------------+---------+-----------+----------+--------------------+ POP     Full           Yes      Yes                                       +--------+---------------+---------+-----------+----------+--------------------+ PTV     Full                                                               +--------+---------------+---------+-----------+----------+--------------------+ PERO    Full                                                              +--------+---------------+---------+-----------+----------+--------------------+   Left Technical Findings: Not visualized segments include CFV and SFJ (bandage in groin area).   Summary: BILATERAL: -No evidence of popliteal cyst, bilaterally. RIGHT: - There is no evidence of deep vein thrombosis in the lower extremity. However, portions of this examination were limited- see technologist comments above.  LEFT: - There is no evidence of deep vein thrombosis in the lower extremity. However, portions of this examination were limited- see technologist comments above.  *See table(s) above for measurements and observations. Electronically signed by Harold Barban MD on 11/13/2021 at 8:53:17 PM.    Final     Review of Systems  Constitutional:  Positive for fatigue. Negative for appetite change and fever.  HENT:  Positive for congestion and sore throat. Negative for nosebleeds.   Eyes:  Negative for redness and visual disturbance.  Respiratory:  Positive for shortness of breath. Negative for cough.   Cardiovascular:  Positive for chest pain.       Status post CABG-postoperative pain  Gastrointestinal:  Negative for abdominal pain, blood in stool, diarrhea, nausea and vomiting.  Genitourinary:  Negative for hematuria.       With urine catheter in place  Musculoskeletal:  Positive for back pain. Negative for joint swelling and myalgias.  Skin:  Negative for rash and wound.  Neurological:  Negative for tremors and headaches.  Blood pressure (!) 107/57, pulse (!) 124, temperature 98.4 F (36.9 C), resp. rate 14, height 5\' 10"  (1.778 m), weight 123.6 kg, SpO2 97 %. Physical Exam Vitals and nursing note reviewed.  Constitutional:      Appearance: Normal appearance. He is obese.  HENT:     Head: Normocephalic and atraumatic.     Right Ear: External  ear normal.     Left Ear:  External ear normal.     Nose: Nose normal.     Mouth/Throat:     Mouth: Mucous membranes are dry.     Pharynx: Oropharynx is clear.  Eyes:     Extraocular Movements: Extraocular movements intact.     Conjunctiva/sclera: Conjunctivae normal.  Neck:     Comments: Left IJ temporary dialysis catheter Cardiovascular:     Rate and Rhythm: Regular rhythm. Tachycardia present.     Pulses: Normal pulses.     Heart sounds: Normal heart sounds.     Comments: Central chest dressing status post CABG Pulmonary:     Breath sounds: Normal breath sounds. No wheezing or rales.  Abdominal:     General: Bowel sounds are normal.     Palpations: Abdomen is soft.     Tenderness: There is no abdominal tenderness. There is no guarding.  Musculoskeletal:     Cervical back: Normal range of motion and neck supple.     Right lower leg: Edema present.     Left lower leg: Edema present.     Comments: 2+ anasarca  Skin:    General: Skin is warm and dry.     Findings: No lesion.  Neurological:     Mental Status: He is alert and oriented to person, place, and time.    Assessment/Plan: 1.  Acute kidney injury on chronic kidney disease stage IIIa: Oliguric, likely ischemic ATN in the setting of septic versus cardiogenic shock.  Fixed urine output in spite of high-dose diuretics with worsening volume status for which we will begin him on CRRT for extracorporeal volume unloading.  I will assess labs closely for any renal recovery to decide on need for transition of modality versus discontinuing renal replacement therapy. Avoid nephrotoxic medications including NSAIDs and iodinated intravenous contrast exposure unless the latter is absolutely indicated.  Preferred narcotic agents for pain control are hydromorphone, fentanyl, and methadone. Morphine should not be used. Avoid Baclofen and avoid oral sodium phosphate and magnesium citrate based laxatives / bowel preps. Continue strict Input and  Output monitoring. Will monitor the patient closely with you and intervene or adjust therapy as indicated by changes in clinical status/labs. 2.  Coronary artery disease status post three-vessel CABG: Appears to be doing well from postoperative standpoint and is on aspirin/statin. 3.  Atrial fibrillation: Status post MAZE procedure at the time of CABG and now normal sinus rhythm with PACs; anticoagulation per primary service. 4.  Anemia: Likely associated with recent surgery and postoperative inflammatory response.  We will continue to trend hemoglobin and hematocrit.  Severino Paolo K. 11/15/2021, 12:09 PM

## 2021-11-15 NOTE — Progress Notes (Addendum)
Advanced Heart Failure Rounding Note  PCP-Cardiologist: None   Subjective:    Remains on milrinone 0.25 + dual pressors, NE 5, VP 0.03.  Co-ox  62%   Abx started 5/25 for suspected septic shock (vancomycin/cefepime), LA 3.5. PCT 1.83>>2.34  AF. WBC down, 21>>16K   Blood Cx NGTD   Has ileus. W/ NGT. Small BM last night.   UOP picking up some, 1L out yesterday w/ lasix gtt at 15/hr.   SCr continues to trend up, 3.66>>4.16 K 3.8   Still volume up. Unable to get CVP to read.   In Afib w/ RVR, 120s. Amio gtt at 60/hr    PT awake A&O. Says he feels "fair". No respiratory difficulty.   Echo: Difficult study. LV EF 55-60%, RV probably normal, no pericardial effusion.    Objective:   Weight Range: 123.6 kg Body mass index is 39.1 kg/m.   Vital Signs:   Temp:  [97.9 F (36.6 C)-99.7 F (37.6 C)] 98.4 F (36.9 C) (05/26 0700) Pulse Rate:  [87-136] 130 (05/26 0700) Resp:  [11-27] 25 (05/26 0700) BP: (86-150)/(40-91) 91/62 (05/26 0700) SpO2:  [92 %-100 %] 95 % (05/26 0700) Arterial Line BP: (61-161)/(44-93) 76/56 (05/26 0200) Last BM Date :  (PTA)  Weight change: Filed Weights   11/12/21 0602 11/13/21 0222 11/14/21 0630  Weight: 127.1 kg 134.1 kg 123.6 kg    Intake/Output:   Intake/Output Summary (Last 24 hours) at 11/15/2021 0803 Last data filed at 11/15/2021 0600 Gross per 24 hour  Intake 2886.96 ml  Output 1255 ml  Net 1631.96 ml      Physical Exam    General:  Obese, sitting up in bed. fatigued appearing. No respiratory difficulty HEENT: normal + EGT  Neck: supple. JVD elevated. + rt IJ CVC Carotids 2+ bilat; no bruits. No lymphadenopathy or thyromegaly appreciated. Cor: PMI nondisplaced. Irregularly irregular rhythm. No rubs, gallops or murmurs. Lungs: decreased BS at the bases bilaterally  Abdomen: obese ++ distention, quite bowl sounds . No bruits or masses.  Extremities: no cyanosis, clubbing, rash, edema Neuro: alert & oriented x 3, cranial  nerves grossly intact. moves all 4 extremities w/o difficulty. Affect pleasant.   Telemetry   Afib 120s   EKG    N/A   Labs    CBC Recent Labs    11/14/21 0500 11/14/21 0506 11/15/21 0359 11/15/21 0410  WBC 21.8*  --  16.1*  --   HGB 9.4*   < > 8.6* 8.5*  HCT 28.7*   < > 26.5* 25.0*  MCV 94.4  --  94.6  --   PLT 160  --  137*  --    < > = values in this interval not displayed.   Basic Metabolic Panel Recent Labs    11/13/21 0528 11/13/21 0805 11/13/21 1613 11/13/21 1615 11/14/21 1612 11/15/21 0359 11/15/21 0410  NA 136   < > 134*   < > 134* 136 133*  K 4.2   < > 4.8   < > 3.7 3.8 3.7  CL 107  --  104   < > 100 99  --   CO2 20*  --  18*   < > 20* 24  --   GLUCOSE 155*  --  267*   < > 242* 171*  --   BUN 25*  --  28*   < > 34* 37*  --   CREATININE 1.87*  --  2.60*   < > 3.66* 4.16*  --  CALCIUM 7.1*  --  7.5*   < > 6.9* 7.1*  --   MG 2.7*  --  2.6*  --   --   --   --    < > = values in this interval not displayed.   Liver Function Tests Recent Labs    11/14/21 0500 11/15/21 0359  AST 29 24  ALT 9 7  ALKPHOS 32* 39  BILITOT 1.0 0.6  PROT 6.0* 6.2*  ALBUMIN 3.8 3.5   Recent Labs    11/15/21 0359  AMYLASE 38   Cardiac Enzymes No results for input(s): CKTOTAL, CKMB, CKMBINDEX, TROPONINI in the last 72 hours.  BNP: BNP (last 3 results) No results for input(s): BNP in the last 8760 hours.  ProBNP (last 3 results) No results for input(s): PROBNP in the last 8760 hours.   D-Dimer No results for input(s): DDIMER in the last 72 hours. Hemoglobin A1C No results for input(s): HGBA1C in the last 72 hours. Fasting Lipid Panel No results for input(s): CHOL, HDL, LDLCALC, TRIG, CHOLHDL, LDLDIRECT in the last 72 hours. Thyroid Function Tests No results for input(s): TSH, T4TOTAL, T3FREE, THYROIDAB in the last 72 hours.  Invalid input(s): FREET3  Other results:   Imaging    No results found.   Medications:     Scheduled Medications:   acetaminophen  1,000 mg Oral Q6H   Or   acetaminophen (TYLENOL) oral liquid 160 mg/5 mL  1,000 mg Per Tube Q6H   aspirin EC  325 mg Oral Daily   Or   aspirin  324 mg Per Tube Daily   bisacodyl  10 mg Oral Daily   Or   bisacodyl  10 mg Rectal Daily   Chlorhexidine Gluconate Cloth  6 each Topical Daily   docusate sodium  200 mg Oral Daily   enoxaparin (LOVENOX) injection  30 mg Subcutaneous Q24H   fluticasone furoate-vilanterol  1 puff Inhalation Daily   insulin detemir  30 Units Subcutaneous BID   lidocaine  2 patch Transdermal Q24H   metoCLOPramide (REGLAN) injection  10 mg Intravenous Q6H   mupirocin cream   Topical BID   neostigmine  0.25 mg Subcutaneous Q6H   pantoprazole  40 mg Oral Daily   rosuvastatin  20 mg Oral Daily   sodium chloride flush  3 mL Intravenous Q12H   vancomycin variable dose per unstable renal function (pharmacist dosing)   Does not apply See admin instructions    Infusions:  sodium chloride Stopped (11/14/21 0549)   sodium chloride     sodium chloride 20 mL/hr at 11/12/21 1509   amiodarone 60 mg/hr (11/15/21 0600)   furosemide (LASIX) 200 mg in dextrose 5% 100 mL (2mg /mL) infusion 15 mg/hr (11/15/21 0600)   insulin 8 Units/hr (11/15/21 0600)   lactated ringers Stopped (11/13/21 2251)   lactated ringers 20 mL/hr at 11/12/21 1600   milrinone 0.25 mcg/kg/min (11/15/21 0600)   norepinephrine (LEVOPHED) Adult infusion 5 mcg/min (11/15/21 0600)   potassium chloride      sodium bicarbonate (isotonic) infusion in sterile water 50 mL/hr at 11/15/21 0600   vasopressin 0.03 Units/min (11/15/21 0600)    PRN Medications: sodium chloride, dextrose, HYDROmorphone (DILAUDID) injection, levalbuterol, ondansetron (ZOFRAN) IV, oxyCODONE, sodium chloride flush    Patient Profile   66 y.o. male with history of CM with recovered EF, persistent atrial fibrillation, DM II, CKD, sick sinus syndrome, OSA, prior smoker. Underwent CABG X 3 and MAZE/PVI on 05/23>>post-op  decrease in UOP and AKI with volume  overload>>postcardiotomy shock requiring milrinone and phenylephrine .   Pre-op echo with EF 50-55%. Post-op echo very poor study but LV EF looks preserved. Unable to visualized RV. No significant pericardial effusion.   Assessment/Plan   1. Shock: Echo reviewed, LV EF 55-60%, RV poorly visualized but probably near-normal, no significant pericardial effusion. Suspect sepsis physiology with distributive shock.  Lactate elevated 3.2>>2.5. PCT elevated 1.83>>2.34.  Co-ox 62%.  - Continue milrinone 0.25.  - Continue NE and VP to maintain MAP > 65. - Empiric coverage with abx, starting vancomycin/cefepime.  2. Acute on chronic primarily diastolic CHF: LV EF 75-17%, RV poorly visualized but probably near-normal, no significant pericardial effusion.  Patient is volume overloaded, suspect primarily right heart failure physiology.  Creatinine up to 4.16. Poor response to lasix gtt  - will need CVVHD. Will consult nephrology  3. AKI on CKD stage 3: Creatinine up to 4.16 today.  Suspect due to shock as well as elevated renal venous pressure in setting of volume overload, ileus, abdominal distention. UOP starting to pick up some.  - Maintain MAP as above.  - Decompress abdomen (has NGT).  - consult nephrology for CVVH  4. CAD: S/p CABG.  - Continue ASA, statin.  5. Atrial fibrillation: Has had Maze and LA appendage clip. - in Afib w/ RVR today  - Eventually will need anticoagulation.  - Continue amiodarone 60 mg/hr.  6. DM2: SSI.  7. ID: WBCs 22>>16K, PCT 1.83>>2.34.  Afebrile.  ?Septic shock.  - Will cover empirically with vancomycin/cefepime.  - Blood culture NGTD 8. Ileus: NGT.  Currently NPO.  9. Pulmonary: CXR today with probable atelectasis. He is on 7L HFNC.  Ileus with abdominal distention worsens respiratory mechanics and suspect component of pulmonary edema as well.  - Diurese/ CVVH for volume removal  - Decompress ileus.  - empiric abx.  - CCM  following.     Length of Stay: [address], PA-C  11/15/2021, 8:03 AM  Advanced Heart Failure Team Pager [phone] (M-F; 7a - 5p)  Please contact [facility] for night-coverage after hours (5p -7a ) and weekends on [email or web address]  Patient seen with PA, agree with the above note.   MAP stable this morning on NE 5, vasopressin 0.03, milrinone 0.25.  Co-ox 62%.  CVP not working this morning, last true reading was 22 last night.  Remains in AF rate 110s-120s, on amiodarone gtt.   Creatinine up to 4.16. Made 1 L urine yesterday but I>>O on Lasix gtt 15 mg/hr and 1 dose metolazone yesterday.  General: NAD Neck: JVP 16+, no thyromegaly or thyroid nodule.  Lungs: Decreased at bases.  CV: Nondisplaced PMI.  Heart tachy, irregular S1/S2, no S3/S4, no murmur.  1+ edema to knees.  Abdomen: Soft, nontender, no hepatosplenomegaly, mild distention.  Skin: Intact without lesions or rashes.  Neurologic: Alert and oriented x 3.  Psych: Normal affect. Extremities: No clubbing or cyanosis.  HEENT: Normal.    1. Shock: Echo reviewed, LV EF 55-60%, RV poorly visualized but probably near-normal, no significant pericardial effusion. Suspect sepsis physiology with distributive shock.  PCT elevated 2.34.  Co-ox 62%. Currently on milrinone 0.25, NE 5, vasopressin 0.03.  - Continue milrinone 0.25.  - Titrating down as able on NE - Continue vasopressin for now.  - Repeat lactate - Empiric coverage with abx, on vancomycin/cefepime.  2. Acute on chronic primarily diastolic CHF: LV EF 49-67%, RV poorly visualized but probably near-normal, no significant pericardial effusion.  Patient is volume overloaded, suspect  primarily right heart failure physiology.  CVP 22 last night, not readable this morning.  Creatinine up to 4.16, I>>O.  - Continue Lasix gtt 15 mg/hr and will give metolazone 5 mg x 1 again today.  - Suspect he will need CVVH, will consult renal.  3. AKI on CKD stage 3: Creatinine up 3.74 =>  4.16 today.  Suspect due to shock as well as elevated renal venous pressure in setting of volume overload, ileus, abdominal distention. UOP about 1000 cc yesterday but I>>O.  - Maintain MAP as above.  - Push diuresis.  - Decompress abdomen (has NGT).  - At this point, I think we are going to need CVVH for volume management.  Will consult renal.  4. CAD: S/p CABG.  - Continue ASA, statin.  5. Atrial fibrillation: Has had Maze and LA appendage clip. Now in AF with mild RVR.   - Eventually will need anticoagulation, start heparin gtt when ok with surgery.  - Continue amiodarone 60 mg/hr.  - DCCV prior to discharge if remains in AF.  6. DM2: SSI.  7. ID: WBCs 22K => 16K, PCT 1.83 => 2.34.  Afebrile.  ?Septic shock component.  - Covering empirically with vancomycin/cefepime.  - Blood cultures NGTD.  8. Ileus: NGT.  Currently NPO.  9. Pulmonary: CXR today with probable atelectasis. He is on 7L HFNC.  Ileus with abdominal distention worsens respiratory mechanics and has component of pulmonary edema as well.  - Diurese as able, suspect will need CVVH for fluid removal.  - Decompress ileus.  - empiric abx.  - CCM following.    CRITICAL CARE Performed by: Loralie Champagne  Total critical care time: 35 minutes  Critical care time was exclusive of separately billable procedures and treating other patients.  Critical care was necessary to treat or prevent imminent or life-threatening deterioration.  Critical care was time spent personally by me on the following activities: development of treatment plan with patient and/or surrogate as well as nursing, discussions with consultants, evaluation of patient's response to treatment, examination of patient, obtaining history from patient or surrogate, ordering and performing treatments and interventions, ordering and review of laboratory studies, ordering and review of radiographic studies, pulse oximetry and re-evaluation of patient's condition.  Loralie Champagne 11/15/2021 8:44 AM

## 2021-11-15 NOTE — Progress Notes (Signed)
NAME:  [name] [name], MRN:  [id number], DOB:  Mar 23, 1956, LOS: 3 ADMISSION DATE:  11/12/2021, CONSULTATION DATE:  11/13/2021 REFERRING MD:  Dr. Aundra Dubin , CHIEF COMPLAINT:  Respiratory distress    History of Present Illness:  [name] [name] is a 66 y.o. male with a PMH significant for but not limited to CAD now s/p CABG, systolic congestive heart failure, CKD, type 2 diabetes, HTN, A-fib chronically anticoagulated, and OSA who presented to the ED 5/23 for elective CABG with MAZE procedure Dr. Lawson Fiscal.  Patient was extubated per heart failure protocol with no incident by by afternoon of 5/24 PCCM was consulted for progressive increase work of breathing.   Pertinent  Medical History  CAD no s/p CABG, systolic congestive heart failure, CKD, type 2 diabetes, HTN, A-fib chronically anticoagulated, and OSA   Significant Hospital Events: Including procedures, antibiotic start and stop dates in addition to other pertinent events   5/23 underwent CABG with MAZE procedure with Dr. Lawson Fiscal 5/24 PCCM consulted for progressive increased work of breathing  5/25 Urine output remained low overnight along with soft BP which prompted addition of vaso and low dose bicarb drip  5/26 Creatinine continues climb but renal function slowly improving. Did have a BM overnight   Interim History / Subjective:  States he feels "about the same"  Reports some back pain   Objective   Blood pressure (!) 112/55, pulse (!) 132, temperature 98.4 F (36.9 C), resp. rate 19, height 5\' 10"  (1.778 m), weight 123.6 kg, SpO2 97 %. CVP:  [16 mmHg-18 mmHg] 18 mmHg      Intake/Output Summary (Last 24 hours) at 11/15/2021 0723 Last data filed at 11/15/2021 0600 Gross per 24 hour  Intake 3045.85 ml  Output 1300 ml  Net 1745.85 ml    Filed Weights   11/12/21 0602 11/13/21 0222 11/14/21 0630  Weight: 127.1 kg 134.1 kg 123.6 kg    Examination: General: Acute on chronically ill appearing middle aged male lying in bed in  NAD HEENT: [place]/AT, MM pink/moist, PERRL,  Neuro: Alert and oriented with flat affect, non-focal  CV: s1s2 regular rate and rhythm, no murmur, rubs, or gallops,  PULM:  Clear to ascultation, no increased work of breathing, no added breath sounds  GI: soft, bowel sounds hypoactive in all 4 quadrants, non-tender, distended Extremities: warm/dry, no edema  Skin: no rashes or lesions  Resolved Hospital Problem list     Assessment & Plan:  CAD now S/P CABG x3 -Underwent elective CABG with Dr. Lawson Fiscal 5/23 Hx of HTN/HLD  -Home medications include; Cozaar, Lopressor, and Crestor  Hx of a-fib anticoagulated at baseline Continue shock; cardiogenic and/or septic  P: Primary management per CTS Continuous telemetry  Resume home medications when appropriate  Continue IV Amio, Lasix, Insulin, Milrinone, Levo, and Vaso drips  Pressors for MAP goal greater than 65 Resumption of Xarelto per CTS Empiric antibiotics started 5/26   At risk respiratory compromises  -felt secondary to possible pulmonary edema in the setting of third spacing and possible developing ileus  Hs of OSA with use on CPAP at baseline  P: Continue supplemental oxygen with SPO2 goal > 90 CPAP at HS  Elevate HOB Mobilize as able Schedule BDs Ensure adequate pain control to avoid pulmonary splinting    History of type 2 diabetes  -Home medications include Glipizide, Metformin, and Farxiga  P: Placed back on insulin drip 5/25 Resume home medications when appropriate    Oliguric AKI superimposed on CKD stage 3a -Creatinine 1.59 with  GFR 497 11/07/2021, currently creatinine 2.6 with GFR 26 by afternoon of 5/24 P: Nephrology consulted per HF 5/26 Place HD cath  Follow renal function Monitor urine output Trend Bmet Avoid nephrotoxins Ensure adequate renal perfusion   ? Illeus  -Bowel gas seen on KUB 5/24 with distended ABD and hypocative bowel sounds. NGT placed  P: 2 BMs overnight  NGT clamped last 24hrs, remove  today  Clear liquid diet  Continue bowel regiment  Suppository today   Best Practice (right click and "Reselect all SmartList Selections" daily)   Diet/type: NPO DVT prophylaxis: SCD GI prophylaxis: PPI Lines: Central line Foley:  Yes, and it is still needed Code Status:  full code Last date of multidisciplinary goals of care discussion: Per primary   Critical care time: 38 min  Fenix Rorke D. Kenton Kingfisher, NP-C [facility] Personal contact information can be found on Amion  11/15/2021, 7:23 AM

## 2021-11-15 NOTE — Progress Notes (Signed)
EVENING ROUNDS NOTE :     [address] [place]       [place] [address]             [phone]                 3 Days Post-Op Procedure(s) (LRB): CORONARY ARTERY BYPASS GRAFTING (CABG) x  3 USING LEFT INTERNAL MAMMARY ARTERY AND RIGHT GREATER SAPHENOUS VEIN (N/A) MAZE (N/A) TRANSESOPHAGEAL ECHOCARDIOGRAM (TEE) (N/A) ENDOVEIN HARVEST OF GREATER SAPHENOUS VEIN   Total Length of Stay:  LOS: 3 days  Events:   No events Remains on CRRT Tachy, already received amio bolus today.    BP 110/77   Pulse (!) 130   Temp 98.8 F (37.1 C)   Resp (!) 32   Ht 5\' 10"  (1.778 m)   Wt 123.6 kg   SpO2 95%   BMI 39.10 kg/m   CVP:  [65 mmHg] 65 mmHg       prismasol BGK 4/2.5      prismasol BGK 4/2.5     sodium chloride Stopped (11/14/21 0549)   sodium chloride     sodium chloride 20 mL/hr at 11/12/21 1509   amiodarone 60 mg/hr (11/15/21 1700)   ceFEPime (MAXIPIME) IV     furosemide (LASIX) 200 mg in dextrose 5% 100 mL (2mg /mL) infusion Stopped (11/15/21 1607)   insulin 5.5 Units/hr (11/15/21 1700)   lactated ringers Stopped (11/13/21 2251)   lactated ringers 20 mL/hr at 11/12/21 1600   milrinone 0.25 mcg/kg/min (11/15/21 1700)   norepinephrine (LEVOPHED) Adult infusion 4 mcg/min (11/15/21 1700)   prismasol BGK 4/2.5      sodium bicarbonate (isotonic) infusion in sterile water 50 mL/hr at 11/15/21 1700   vasopressin 0.03 Units/min (11/15/21 1700)    I/O last 3 completed shifts: In: 4663.3 [I.V.:4081.7; NG/GT:30; IV Piggyback:551.6] Out: 2019 [Urine:1324; Emesis/NG output:475; Chest Tube:220]      Latest Ref Rng & Units 11/15/2021    4:10 AM 11/15/2021    3:59 AM 11/14/2021    5:06 AM  CBC  WBC 4.0 - 10.5 K/uL  16.1     Hemoglobin 13.0 - 17.0 g/dL 8.5   8.6   9.9    Hematocrit 39.0 - 52.0 % 25.0   26.5   29.0    Platelets 150 - 400 K/uL  137          Latest Ref Rng & Units 11/15/2021    3:47 PM 11/15/2021    4:10 AM 11/15/2021    3:59 AM  BMP  Glucose 70 - 99  mg/dL 171    171    BUN 8 - 23 mg/dL 42    37    Creatinine 0.61 - 1.24 mg/dL 4.61    4.16    Sodium 135 - 145 mmol/L 135   133   136    Potassium 3.5 - 5.1 mmol/L 3.6   3.7   3.8    Chloride 98 - 111 mmol/L 98    99    CO2 22 - 32 mmol/L 18    24    Calcium 8.9 - 10.3 mg/dL 6.7    7.1      ABG    Component Value Date/Time   PHART 7.332 (L) 11/15/2021 0410   PCO2ART 44.4 11/15/2021 0410   PO2ART 74 (L) 11/15/2021 0410   HCO3 23.5 11/15/2021 0410   TCO2 25 11/15/2021 0410   ACIDBASEDEF 2.0 11/15/2021 0410   O2SAT 61.7 11/15/2021 0454  Melodie Bouillon, MD 11/15/2021 5:25 PM

## 2021-11-15 NOTE — Progress Notes (Signed)
Pt refused hospital CPAP for the night. Pt home CPAP being brought tomorrow. RT will cont to monitor as needed.

## 2021-11-15 NOTE — Procedures (Signed)
Central Venous Catheter Insertion Procedure Note  Jaiyon Wander  [id number]  1956/06/03  Date:11/15/21  Time:11:48 AM   Provider Performing:Paris [place]   Procedure: Insertion of Non-tunneled Central Venous Catheter(36556)with US guidance (35597)    Indication(s) Hemodialysis  Consent Risks of the procedure as well as the alternatives and risks of each were explained to the patient and/or caregiver.  Consent for the procedure was obtained and is signed in the bedside chart  Anesthesia Topical only with 1% lidocaine   Timeout Verified patient identification, verified procedure, site/side was marked, verified correct patient position, special equipment/implants available, medications/allergies/relevant history reviewed, required imaging and test results available.  Sterile Technique Maximal sterile technique including full sterile barrier drape, hand hygiene, sterile gown, sterile gloves, mask, hair covering, sterile ultrasound probe cover (if used).  Procedure Description Area of catheter insertion was cleaned with chlorhexidine and draped in sterile fashion.   With real-time ultrasound guidance a HD catheter was placed into the left internal jugular vein.  Nonpulsatile blood flow and easy flushing noted in all ports.  The catheter was sutured in place and sterile dressing applied.  Complications/Tolerance None; patient tolerated the procedure well. Chest X-ray is ordered to verify placement for internal jugular or subclavian cannulation.  Chest x-ray is not ordered for femoral cannulation.  EBL Minimal  Specimen(s) None

## 2021-11-15 NOTE — Progress Notes (Signed)
Pt stated he doesn't want to wear hospital CPAP for the night since he didn't tolerate it the night before. Pt stated wife will be bringing his  home CPAP unit for tomorrow.

## 2021-11-15 NOTE — Progress Notes (Signed)
eLink Physician-Brief Progress Note Patient Name: [name] [name] DOB: 1955-11-30 MRN: [id number]   Date of Service  11/15/2021  HPI/Events of Note  Patient needs to transition off Insulin gtt.  eICU Interventions  Insulin transition orders entered. Will trend blood sugar to determine long term insulin dosing requirements.        Frederik Pear 11/15/2021, 9:41 PM

## 2021-11-15 NOTE — Progress Notes (Signed)
3 Days Post-Op Procedure(s) (LRB): CORONARY ARTERY BYPASS GRAFTING (CABG) x  3 USING LEFT INTERNAL MAMMARY ARTERY AND RIGHT GREATER SAPHENOUS VEIN (N/A) MAZE (N/A) TRANSESOPHAGEAL ECHOCARDIOGRAM (TEE) (N/A) ENDOVEIN HARVEST OF GREATER SAPHENOUS VEIN Subjective: Surgical discomfort upper back and breathing feel better today Remains with oliguric AKI stage 3 with some increased output in past 24 hrs Remains in afib with iv amio at 28 CXR ok, KUB with mild ileus decompressed with NG tube CBG, metabolic acidosis better back on iv glucommander Objective: Vital signs in last 24 hours: Temp:  [98.1 F (36.7 C)-99.7 F (37.6 C)] 98.4 F (36.9 C) (05/26 0700) Pulse Rate:  [90-136] 130 (05/26 0700) Cardiac Rhythm: Atrial fibrillation (05/25 2000) Resp:  [11-27] 25 (05/26 0700) BP: (86-143)/(40-91) 91/62 (05/26 0700) SpO2:  [92 %-100 %] 95 % (05/26 0700) Arterial Line BP: (61-161)/(44-72) 76/56 (05/26 0200)  Hemodynamic parameters for last 24 hours: CVP:  [18 mmHg] 18 mmHg  Intake/Output from previous day: 05/25 0701 - 05/26 0700 In: 3045.9 [I.V.:2788.9; NG/GT:30; IV Piggyback:226.9] Out: 1300 [Urine:1065; Emesis/NG output:125; Chest Tube:110] Intake/Output this shift: No intake/output data recorded.       Exam    General- alert and comfortable on 7 L O2 [place]    Neck- no JVD, no cervical adenopathy palpable, no carotid bruit   Lungs- diminished BS at bases   Cor- irregular rate and rhythm, no murmur , gallop   Abdomen- distended , no guarding   Extremities - warm, non-tender, minimal edema   Neuro- oriented, appropriate, no focal weakness   Lab Results: Recent Labs    11/14/21 0500 11/14/21 0506 11/15/21 0359 11/15/21 0410  WBC 21.8*  --  16.1*  --   HGB 9.4*   < > 8.6* 8.5*  HCT 28.7*   < > 26.5* 25.0*  PLT 160  --  137*  --    < > = values in this interval not displayed.   BMET:  Recent Labs    11/14/21 1612 11/15/21 0359 11/15/21 0410  NA 134* 136 133*  K 3.7  3.8 3.7  CL 100 99  --   CO2 20* 24  --   GLUCOSE 242* 171*  --   BUN 34* 37*  --   CREATININE 3.66* 4.16*  --   CALCIUM 6.9* 7.1*  --     PT/INR:  Recent Labs    11/12/21 1504  LABPROT 17.3*  INR 1.4*   ABG    Component Value Date/Time   PHART 7.332 (L) 11/15/2021 0410   HCO3 23.5 11/15/2021 0410   TCO2 25 11/15/2021 0410   ACIDBASEDEF 2.0 11/15/2021 0410   O2SAT 61.7 11/15/2021 0454   CBG (last 3)  Recent Labs    11/15/21 0626 11/15/21 0712 11/15/21 0824  GLUCAP 182* 137* 146*    Assessment/Plan: S/P Procedure(s) (LRB): CORONARY ARTERY BYPASS GRAFTING (CABG) x  3 USING LEFT INTERNAL MAMMARY ARTERY AND RIGHT GREATER SAPHENOUS VEIN (N/A) MAZE (N/A) TRANSESOPHAGEAL ECHOCARDIOGRAM (TEE) (N/A) ENDOVEIN HARVEST OF GREATER SAPHENOUS VEIN Renal consult Cont NG decompression Cont milrinone , vasopressin for hemodynamics Cont iv amio for postop afib Broad spectrum antibiotics for poss sepsis  LOS: 3 days    Dahlia Byes 11/15/2021

## 2021-11-15 NOTE — Progress Notes (Signed)
PT Cancellation Note  Patient Details Name: [name] [name] MRN: [id number] DOB: 1955/12/04   Cancelled Treatment:    Reason Eval/Treat Not Completed: Patient not medically ready. RN reporting pt has been increasingly SOB and requesting to hold off on PT today. Will plan to follow-up another day as able.   [name], PT, DPT Acute Rehabilitation Services  Pager: [phone] Office: [place] 11/15/2021, 4:09 PM

## 2021-11-15 NOTE — Progress Notes (Addendum)
Pharmacy Antibiotic Note  [name] [name] is a 66 y.o. male admitted on 11/12/2021 with CAD now s/p CABG on 5/23. 5/24 patient developed post-cardiotomy shock, now with possibility of an infectious component. Pharmacy has been consulted for vancomycin and cefepime dosing.  WBC is down slightly this morning at 16.1 from 21.8. Serum creatinine increased to 4.16 and patient is planned to be started on dialysis. Vancomycin random level from this morning was 15 (trough goal 15-20), indicating the need for another dose. Will also adjust cefepime dosing for current renal function.  Plan: Vancomycin 1g IV x1 Adjust to cefepime 1g IV q24h F/u plans for hemodialysis and adjust antibiotics as needed - please see addendum below Monitor renal function, C/S, duration of therapy, vancomycin levels as indicated, and de-escalation    Height: 5\' 10"  (177.8 cm) Weight: 123.6 kg (272 lb 7.8 oz) IBW/kg (Calculated) : 73  Temp (24hrs), Avg:99 F (37.2 C), Min:98.2 F (36.8 C), Max:99.7 F (37.6 C)  Recent Labs  Lab 11/12/21 2057 11/13/21 0528 11/13/21 1613 11/14/21 0149 11/14/21 0437 11/14/21 0500 11/14/21 1039 11/14/21 1147 11/14/21 1414 11/14/21 1612 11/15/21 0359 11/15/21 0454  WBC 18.3* 22.3* 28.3*  --   --  21.8*  --   --   --   --  16.1*  --   CREATININE 1.45* 1.87* 2.60*  --   --  3.74*  --   --   --  3.66* 4.16*  --   LATICACIDVEN  --   --   --  3.5* 2.3*  --   --  3.2* 2.5*  --   --   --   VANCOTROUGH  --   --   --   --   --   --  20  --   --   --   --   --   VANCORANDOM  --   --   --   --   --   --   --   --   --   --   --  15    Estimated Creatinine Clearance: 22.7 mL/min (A) (by C-G formula based on SCr of 4.16 mg/dL (H)).    No Known Allergies  Antimicrobials this admission: 5/23-5/25 periop cefazolin  Cefepime 5/25>> Vancomycin 5/23>>   Dose adjustments this admission: Cefepime 1g q12h > cefepime 2g q12h on 5/25 > cefepime 1g q24h on 5/26  Levels this admission: 5/25 1000  vanc trough: 20 > no dose 5/26 0454 vanc random: 15 > vancomycin 1g IV x1  Microbiology results: 5/25 BCx: sent  Thank you for including pharmacy in the care of this patient.  Zenaida Deed, PharmD PGY1 Acute Care Pharmacy Resident  Phone: [phone] 11/15/2021  10:18 AM  Please check [email or web address] for unit-specific pharmacy phone numbers. _______________________________ Addendum 11/15/2021 1521:  Patient to be started on CRRT today. Adjusted antibiotics for CRRT dosing.  Plan: Adjust to cefepime 2g IV q12h  F/u vancomycin random level with AM labs for subsequent dosing  F/u timing of CRRT initiation    Zenaida Deed, PharmD 11/15/2021  3:24 PM

## 2021-11-16 ENCOUNTER — Inpatient Hospital Stay (HOSPITAL_COMMUNITY): Payer: Medicare HMO

## 2021-11-16 DIAGNOSIS — Z951 Presence of aortocoronary bypass graft: Secondary | ICD-10-CM | POA: Diagnosis not present

## 2021-11-16 LAB — POCT I-STAT EG7
Acid-base deficit: 3 mmol/L — ABNORMAL HIGH (ref 0.0–2.0)
Bicarbonate: 24.2 mmol/L (ref 20.0–28.0)
Calcium, Ion: 0.99 mmol/L — ABNORMAL LOW (ref 1.15–1.40)
HCT: 25 % — ABNORMAL LOW (ref 39.0–52.0)
Hemoglobin: 8.5 g/dL — ABNORMAL LOW (ref 13.0–17.0)
O2 Saturation: 63 %
Patient temperature: 36.2
Potassium: 4 mmol/L (ref 3.5–5.1)
Sodium: 133 mmol/L — ABNORMAL LOW (ref 135–145)
TCO2: 26 mmol/L (ref 22–32)
pCO2, Ven: 49.4 mmHg (ref 44–60)
pH, Ven: 7.293 (ref 7.25–7.43)
pO2, Ven: 35 mmHg (ref 32–45)

## 2021-11-16 LAB — COOXEMETRY PANEL
Carboxyhemoglobin: <0.3 % — ABNORMAL LOW (ref 0.5–1.5)
Methemoglobin: <0.7 % (ref 0.0–1.5)
O2 Saturation: 57 %
Total hemoglobin: 9 g/dL — ABNORMAL LOW (ref 12.0–16.0)

## 2021-11-16 LAB — RENAL FUNCTION PANEL
Albumin: 3.4 g/dL — ABNORMAL LOW (ref 3.5–5.0)
Albumin: 3.2 g/dL — ABNORMAL LOW (ref 3.5–5.0)
Anion gap: 15 (ref 5–15)
Anion gap: 12 (ref 5–15)
BUN: 31 mg/dL — ABNORMAL HIGH (ref 8–23)
BUN: 33 mg/dL — ABNORMAL HIGH (ref 8–23)
CO2: 22 mmol/L (ref 22–32)
CO2: 22 mmol/L (ref 22–32)
Calcium: 7 mg/dL — ABNORMAL LOW (ref 8.9–10.3)
Calcium: 7.3 mg/dL — ABNORMAL LOW (ref 8.9–10.3)
Chloride: 95 mmol/L — ABNORMAL LOW (ref 98–111)
Chloride: 99 mmol/L (ref 98–111)
Creatinine, Ser: 3.53 mg/dL — ABNORMAL HIGH (ref 0.61–1.24)
Creatinine, Ser: 3.48 mg/dL — ABNORMAL HIGH (ref 0.61–1.24)
GFR, Estimated: 18 mL/min — ABNORMAL LOW (ref >60)
GFR, Estimated: 18 mL/min — ABNORMAL LOW (ref >60)
Glucose, Bld: 134 mg/dL — ABNORMAL HIGH (ref 70–99)
Glucose, Bld: 230 mg/dL — ABNORMAL HIGH (ref 70–99)
Phosphorus: 4.4 mg/dL (ref 2.5–4.6)
Phosphorus: 5 mg/dL — ABNORMAL HIGH (ref 2.5–4.6)
Potassium: 4 mmol/L (ref 3.5–5.1)
Potassium: 4.5 mmol/L (ref 3.5–5.1)
Sodium: 132 mmol/L — ABNORMAL LOW (ref 135–145)
Sodium: 133 mmol/L — ABNORMAL LOW (ref 135–145)

## 2021-11-16 LAB — HEPATIC FUNCTION PANEL
ALT: 8 U/L (ref 0–44)
AST: 29 U/L (ref 15–41)
Albumin: 3.3 g/dL — ABNORMAL LOW (ref 3.5–5.0)
Alkaline Phosphatase: 46 U/L (ref 38–126)
Bilirubin, Direct: 0.1 mg/dL (ref 0.0–0.2)
Indirect Bilirubin: 0.8 mg/dL (ref 0.3–0.9)
Total Bilirubin: 0.9 mg/dL (ref 0.3–1.2)
Total Protein: 6.3 g/dL — ABNORMAL LOW (ref 6.5–8.1)

## 2021-11-16 LAB — CBC
HCT: 25.8 % — ABNORMAL LOW (ref 39.0–52.0)
Hemoglobin: 8.1 g/dL — ABNORMAL LOW (ref 13.0–17.0)
MCH: 30.1 pg (ref 26.0–34.0)
MCHC: 31.4 g/dL (ref 30.0–36.0)
MCV: 95.9 fL (ref 80.0–100.0)
Platelets: 137 10*3/uL — ABNORMAL LOW (ref 150–400)
RBC: 2.69 MIL/uL — ABNORMAL LOW (ref 4.22–5.81)
RDW: 15.1 % (ref 11.5–15.5)
WBC: 14.5 10*3/uL — ABNORMAL HIGH (ref 4.0–10.5)
nRBC: 0 % (ref 0.0–0.2)

## 2021-11-16 LAB — GLUCOSE, CAPILLARY
Glucose-Capillary: 111 mg/dL — ABNORMAL HIGH (ref 70–99)
Glucose-Capillary: 123 mg/dL — ABNORMAL HIGH (ref 70–99)
Glucose-Capillary: 163 mg/dL — ABNORMAL HIGH (ref 70–99)
Glucose-Capillary: 170 mg/dL — ABNORMAL HIGH (ref 70–99)
Glucose-Capillary: 203 mg/dL — ABNORMAL HIGH (ref 70–99)
Glucose-Capillary: 203 mg/dL — ABNORMAL HIGH (ref 70–99)
Glucose-Capillary: 213 mg/dL — ABNORMAL HIGH (ref 70–99)

## 2021-11-16 LAB — HEPARIN LEVEL (UNFRACTIONATED): Heparin Unfractionated: 0.18 IU/mL — ABNORMAL LOW (ref 0.30–0.70)

## 2021-11-16 LAB — LACTIC ACID, PLASMA: Lactic Acid, Venous: 1.5 mmol/L (ref 0.5–1.9)

## 2021-11-16 LAB — MAGNESIUM: Magnesium: 2.4 mg/dL (ref 1.7–2.4)

## 2021-11-16 LAB — VANCOMYCIN, RANDOM: Vancomycin Rm: 17

## 2021-11-16 MED ORDER — GUAIFENESIN ER 600 MG PO TB12
1200.0000 mg | ORAL_TABLET | Freq: Two times a day (BID) | ORAL | Status: DC
  Administered 2021-11-16 – 2021-12-04 (×35): 1200 mg via ORAL
  Filled 2021-11-16 (×35): qty 2

## 2021-11-16 MED ORDER — IPRATROPIUM-ALBUTEROL 0.5-2.5 (3) MG/3ML IN SOLN
3.0000 mL | Freq: Four times a day (QID) | RESPIRATORY_TRACT | Status: DC
  Administered 2021-11-16 – 2021-11-19 (×13): 3 mL via RESPIRATORY_TRACT
  Filled 2021-11-16 (×13): qty 3

## 2021-11-16 MED ORDER — VANCOMYCIN HCL 1250 MG/250ML IV SOLN
1250.0000 mg | INTRAVENOUS | Status: DC
  Administered 2021-11-16 – 2021-11-17 (×2): 1250 mg via INTRAVENOUS
  Filled 2021-11-16 (×3): qty 250

## 2021-11-16 MED ORDER — BISACODYL 10 MG RE SUPP
10.0000 mg | Freq: Every day | RECTAL | Status: DC
  Administered 2021-11-16 – 2021-11-24 (×7): 10 mg via RECTAL
  Filled 2021-11-16 (×10): qty 1

## 2021-11-16 MED ORDER — INSULIN ASPART 100 UNIT/ML IJ SOLN
0.0000 [IU] | INTRAMUSCULAR | Status: DC
  Administered 2021-11-16: 8 [IU] via SUBCUTANEOUS
  Administered 2021-11-17: 2 [IU] via SUBCUTANEOUS
  Administered 2021-11-17: 4 [IU] via SUBCUTANEOUS
  Administered 2021-11-17: 8 [IU] via SUBCUTANEOUS
  Administered 2021-11-17: 4 [IU] via SUBCUTANEOUS
  Administered 2021-11-17 (×2): 2 [IU] via SUBCUTANEOUS
  Administered 2021-11-17 – 2021-11-18 (×4): 4 [IU] via SUBCUTANEOUS
  Administered 2021-11-18: 8 [IU] via SUBCUTANEOUS
  Administered 2021-11-18 – 2021-11-19 (×4): 4 [IU] via SUBCUTANEOUS
  Administered 2021-11-19: 2 [IU] via SUBCUTANEOUS
  Administered 2021-11-19: 4 [IU] via SUBCUTANEOUS
  Administered 2021-11-19: 8 [IU] via SUBCUTANEOUS
  Administered 2021-11-19: 4 [IU] via SUBCUTANEOUS
  Administered 2021-11-20: 8 [IU] via SUBCUTANEOUS
  Administered 2021-11-20: 2 [IU] via SUBCUTANEOUS
  Administered 2021-11-20 (×2): 8 [IU] via SUBCUTANEOUS
  Administered 2021-11-20: 2 [IU] via SUBCUTANEOUS
  Administered 2021-11-20: 8 [IU] via SUBCUTANEOUS
  Administered 2021-11-21 (×2): 2 [IU] via SUBCUTANEOUS
  Administered 2021-11-21: 8 [IU] via SUBCUTANEOUS
  Administered 2021-11-21: 2 [IU] via SUBCUTANEOUS
  Administered 2021-11-21: 16 [IU] via SUBCUTANEOUS
  Administered 2021-11-22: 8 [IU] via SUBCUTANEOUS
  Administered 2021-11-22: 16 [IU] via SUBCUTANEOUS
  Administered 2021-11-22 (×2): 8 [IU] via SUBCUTANEOUS
  Administered 2021-11-23: 4 [IU] via SUBCUTANEOUS
  Administered 2021-11-23: 8 [IU] via SUBCUTANEOUS
  Administered 2021-11-23: 4 [IU] via SUBCUTANEOUS
  Administered 2021-11-23: 1 [IU] via SUBCUTANEOUS
  Administered 2021-11-23 – 2021-11-24 (×2): 4 [IU] via SUBCUTANEOUS
  Administered 2021-11-24: 2 [IU] via SUBCUTANEOUS
  Administered 2021-11-24: 12 [IU] via SUBCUTANEOUS

## 2021-11-16 MED ORDER — HEPARIN (PORCINE) 25000 UT/250ML-% IV SOLN
1800.0000 [IU]/h | INTRAVENOUS | Status: DC
  Administered 2021-11-16: 1200 [IU]/h via INTRAVENOUS
  Administered 2021-11-18 – 2021-11-25 (×11): 1700 [IU]/h via INTRAVENOUS
  Administered 2021-11-26: 1800 [IU]/h via INTRAVENOUS
  Filled 2021-11-16 (×16): qty 250

## 2021-11-16 MED ORDER — METHYLNALTREXONE BROMIDE 12 MG/0.6ML ~~LOC~~ SOLN
12.0000 mg | Freq: Once | SUBCUTANEOUS | Status: AC
  Administered 2021-11-16: 12 mg via SUBCUTANEOUS
  Filled 2021-11-16: qty 0.6

## 2021-11-16 NOTE — Progress Notes (Signed)
[facility] for IV heparin Indication: atrial fibrillation  No Known Allergies  Patient Measurements: Height: 5\' 10"  (177.8 cm) Weight: 133.2 kg (293 lb 10.4 oz) IBW/kg (Calculated) : 73 Heparin Dosing Weight: ~ 100 kg  Vital Signs: Temp: 97.8 F (36.6 C) (05/27 0900) Temp Source: Bladder (05/27 0000) BP: 119/48 (05/27 1045) Pulse Rate: 106 (05/27 1045)  Labs: Recent Labs    11/14/21 0500 11/14/21 0506 11/15/21 0359 11/15/21 0410 11/15/21 1547 11/16/21 0414 11/16/21 0418  HGB 9.4*   < > 8.6* 8.5*  --  8.5* 8.1*  HCT 28.7*   < > 26.5* 25.0*  --  25.0* 25.8*  PLT 160  --  137*  --   --   --  137*  CREATININE 3.74*   < > 4.16*  --  4.61*  --  3.53*   < > = values in this interval not displayed.    Estimated Creatinine Clearance: 27.9 mL/min (A) (by C-G formula based on SCr of 3.53 mg/dL (H)).   Medical History: Past Medical History:  Diagnosis Date   Atrial fibrillation ([place])    Benign essential HTN 07/30/2014   Bradycardia 01/25/2015   CHF (congestive heart failure) (HCC)    Chronic anticoagulation 16/03/9603   Chronic systolic CHF (congestive heart failure), NYHA class 3 ([place]) 08/30/2014   Overview:  Global ef 30%   CKD (chronic kidney disease)    Class 3 severe obesity in adult [facility]) 04/28/2017   Coronary artery disease    Diabetes mellitus without complication ([place])    Diabetic neuropathy ([place]) 07/28/2014   Dysrhythmia    Erectile dysfunction    High risk medication use 05/26/2018   Hypertension    Hypertensive heart disease with heart failure ([place]) 07/30/2014   Lumbago    LV dysfunction 04/28/2017   Mixed hyperlipidemia 07/28/2014   OSA (obstructive sleep apnea)    CPAP   Paroxysmal atrial fibrillation ([place]) 07/28/2014   Sinus node dysfunction ([place]) 04/21/2016   Testicular hypofunction    Uncontrolled type 2 diabetes mellitus with microalbuminuric diabetic nephropathy 07/28/2014    Medications:  Infusions:     prismasol BGK 4/2.5 400 mL/hr at 11/16/21 0406    prismasol BGK 4/2.5 400 mL/hr at 11/16/21 0407   sodium chloride Stopped (11/14/21 0549)   sodium chloride     sodium chloride 20 mL/hr at 11/12/21 1509   amiodarone 60 mg/hr (11/16/21 1100)   ceFEPime (MAXIPIME) IV Stopped (11/16/21 0956)   heparin     lactated ringers Stopped (11/13/21 2251)   lactated ringers 10 mL/hr at 11/16/21 1100   milrinone 0.25 mcg/kg/min (11/16/21 1100)   norepinephrine (LEVOPHED) Adult infusion 3 mcg/min (11/16/21 1100)   prismasol BGK 4/2.5 1,500 mL/hr at 11/16/21 0816   vancomycin 166.7 mL/hr at 11/16/21 1100   vasopressin 0.03 Units/min (11/16/21 1100)    Assessment: 66 yo male on chronic Xarelto for afib.  Now s/p CABG with MAZE, and on CRRT.  Remains in afib.  Pharmacy asked to resume anticoagulation with IV heparin.  Hgb low but stable, platelet count okay.  No overt bleeding or complications noted.  Goal of Therapy:  Heparin level 0.3-0.5 Monitor platelets by anticoagulation protocol: Yes   Plan:  Start IV heparin without bolus at 1200 units/hr. Check heparin level in 8 hrs Daily heparin level and CBC.  [place] Crane, Roylene Reason, [facility] Clinical Pharmacist  11/16/2021 11:24 AM   [facility] pharmacy phone numbers are listed on [email or web address]

## 2021-11-16 NOTE — Progress Notes (Signed)
NAME:  [name] [name], MRN:  [id number], DOB:  08-08-1955, LOS: 4 ADMISSION DATE:  11/12/2021, CONSULTATION DATE:  11/13/2021 REFERRING MD:  Dr. Aundra Dubin , CHIEF COMPLAINT:  Respiratory distress    History of Present Illness:  [name] [name] is a 66 y.o. male with a PMH significant for but not limited to CAD now s/p CABG, systolic congestive heart failure, CKD, type 2 diabetes, HTN, A-fib chronically anticoagulated, and OSA who presented to the ED 5/23 for elective CABG with MAZE procedure Dr. Lawson Fiscal.  Patient was extubated per heart failure protocol with no incident by by afternoon of 5/24 PCCM was consulted for progressive increase work of breathing.   Pertinent  Medical History  CAD no s/p CABG, systolic congestive heart failure, CKD, type 2 diabetes, HTN, A-fib chronically anticoagulated, and OSA   Significant Hospital Events: Including procedures, antibiotic start and stop dates in addition to other pertinent events   5/23 underwent CABG with MAZE procedure with Dr. Lawson Fiscal 5/24 PCCM consulted for progressive increased work of breathing  5/25 Urine output remained low overnight along with soft BP which prompted addition of vaso and low dose bicarb drip  5/26 CRRT started  Interim History / Subjective:  Feeling better with fluid removal. Having some thick mucus but with aggressive coughing able to expectorate.  Objective   Blood pressure 109/67, pulse 100, temperature (!) 97.3 F (36.3 C), resp. rate (!) 22, height 5\' 10"  (1.778 m), weight 133.2 kg, SpO2 98 %. CVP:  [23 mmHg-65 mmHg] 29 mmHg      Intake/Output Summary (Last 24 hours) at 11/16/2021 0851 Last data filed at 11/16/2021 0800 Gross per 24 hour  Intake 2951.16 ml  Output 5060 ml  Net -2108.84 ml    Filed Weights   11/13/21 0222 11/14/21 0630 11/16/21 0500  Weight: 134.1 kg 123.6 kg 133.2 kg    Examination: No distress Abdomen remains slightly distended Anasarca a little better Lung sounds diminished at  bases Heart sounds irregular, ext warm Sternotomy site CDI  [place] 57% on milrinone 0.25, levophed 4, vaso 0.03  Resolved Hospital Problem list     Assessment & Plan:  CAD now S/P CABG x3 -Underwent elective CABG with Dr. Lawson Fiscal 5/23 Hx of HTN/HLD  -Home medications include; Cozaar, Lopressor, and Crestor  Hx of a-fib anticoagulated at baseline- remains in afib Continue shock; cardiogenic and/or septic  P: Primary management per CTS Continue IV Amio, Lasix, Insulin, Milrinone, Levo, and Vaso drips  Pressors for MAP goal greater than 65 Resumption of AC at some point per TCTS Empiric antibiotics started 5/26   At risk respiratory compromises  -felt secondary to possible pulmonary edema in the setting of third spacing and possible developing ileus  - improved Hs of OSA with use on CPAP at baseline  P: Continue supplemental oxygen with SPO2 goal > 90 CPAP qHS  Elevate HOB Mobilize as able Schedule BDs Adding mucinex and flutter valve   History of type 2 diabetes  -Home medications include Glipizide, Metformin, and Farxiga  P: Levemir and SSI   Oliguric AKI superimposed on CKD stage 3a -Creatinine 1.59 with GFR 497 11/07/2021, currently creatinine 2.6 with GFR 26 by afternoon of 5/24 P: Push fluid removal, appreciate nephrology help  Illeus  -Last BM 5/26 P: Attempting clear liquid diet Standing suppositories, relistor x 1  Best Practice (right click and "Reselect all SmartList Selections" daily)   Diet/type: clear liquid diet DVT prophylaxis: SCD GI prophylaxis: PPI Lines: Central line Foley:  Yes, and it  is still needed Code Status:  full code Last date of multidisciplinary goals of care discussion: Per primary   Critical care time: 4 min  Erskine Emery MD PCCM

## 2021-11-16 NOTE — Progress Notes (Addendum)
[address]       [place] [address]             [phone]                 4 Days Post-Op Procedure(s) (LRB): CORONARY ARTERY BYPASS GRAFTING (CABG) x  3 USING LEFT INTERNAL MAMMARY ARTERY AND RIGHT GREATER SAPHENOUS VEIN (N/A) MAZE (N/A) TRANSESOPHAGEAL ECHOCARDIOGRAM (TEE) (N/A) ENDOVEIN HARVEST OF GREATER SAPHENOUS VEIN   Events: Remains in afib On CRRT _______________________________________________________________ Vitals: BP 109/61   Pulse (!) 107   Temp (!) 97.3 F (36.3 C)   Resp (!) 24   Ht 5\' 10"  (1.778 m)   Wt 133.2 kg   SpO2 97%   BMI 42.13 kg/m  Filed Weights   11/13/21 0222 11/14/21 0630 11/16/21 0500  Weight: 134.1 kg 123.6 kg 133.2 kg     - Neuro: arousable  - Cardiovascular: afib, rate in 100s  Drips: amio 60, milr 0.25, vaso 0.01.   CVP:  [23 mmHg-46 mmHg] 29 mmHg  - Pulm: HFNC.  Coarse BS    ABG    Component Value Date/Time   PHART 7.332 (L) 11/15/2021 0410   PCO2ART 44.4 11/15/2021 0410   PO2ART 74 (L) 11/15/2021 0410   HCO3 24.2 11/16/2021 0414   TCO2 26 11/16/2021 0414   ACIDBASEDEF 3.0 (H) 11/16/2021 0414   O2SAT 57 11/16/2021 0418    - Abd: ND - Extremity: warm  .Intake/Output      05/26 0701 05/27 0700 05/27 0701 05/28 0700   P.O. 100    I.V. (mL/kg) 2977.3 (22.4) 234 (1.8)   Other 20    NG/GT     IV Piggyback 100 100   Total Intake(mL/kg) 3197.3 (24) 334 (2.5)   Urine (mL/kg/hr) 115 (0)    Emesis/NG output     Other 4594 786   Chest Tube     Total Output 4709 786   Net -1511.7 -452           _______________________________________________________________ Labs:    Latest Ref Rng & Units 11/16/2021    4:18 AM 11/16/2021    4:14 AM 11/15/2021    4:10 AM  CBC  WBC 4.0 - 10.5 K/uL 14.5      Hemoglobin 13.0 - 17.0 g/dL 8.1   8.5   8.5    Hematocrit 39.0 - 52.0 % 25.8   25.0   25.0    Platelets 150 - 400 K/uL 137          Latest Ref Rng & Units 11/16/2021    4:18 AM 11/16/2021     4:14 AM 11/15/2021    3:47 PM  CMP  Glucose 70 - 99 mg/dL 914    782    BUN 8 - 23 mg/dL 31    42    Creatinine 0.61 - 1.24 mg/dL 9.56    2.13    Sodium 135 - 145 mmol/L 132   133   135    Potassium 3.5 - 5.1 mmol/L 4.0   4.0   3.6    Chloride 98 - 111 mmol/L 95    98    CO2 22 - 32 mmol/L 22    18    Calcium 8.9 - 10.3 mg/dL 7.0    6.7    Total Protein 6.5 - 8.1 g/dL 6.3      Total Bilirubin 0.3 - 1.2 mg/dL 0.9      Alkaline Phos 38 -  126 U/L 46      AST 15 - 41 U/L 29      ALT 0 - 44 U/L 8        CXR: -  _______________________________________________________________  Assessment and Plan: POD  5 s/p CABG MAZE  Neuro: pain controlled CV: weaning gtts as tolerated.  Remains in afib.  On amio, and heparin  Pulm: continue pulm hyg Renal: on CRRT.  Running even today GI: on diet Heme: stable, starting heparin ID: Afebrile Endo: SSI Dispo: continue ICU care   Corliss Skains 11/16/2021 10:49 AM

## 2021-11-16 NOTE — Progress Notes (Signed)
ANTICOAGULATION CONSULT NOTE   Pharmacy Consult for IV heparin Indication: atrial fibrillation  No Known Allergies  Patient Measurements: Height: 5\' 10"  (177.8 cm) Weight: 133.2 kg (293 lb 10.4 oz) IBW/kg (Calculated) : 73 Heparin Dosing Weight: ~ 100 kg  Vital Signs: Temp: 97.7 F (36.5 C) (05/27 2100) Temp Source: Bladder (05/27 2100) BP: 125/58 (05/27 2100) Pulse Rate: 107 (05/27 2100)  Labs: Recent Labs    11/14/21 0500 11/14/21 0506 11/15/21 0359 11/15/21 0410 11/15/21 1547 11/16/21 0414 11/16/21 0418 11/16/21 1600 11/16/21 2025  HGB 9.4*   < > 8.6* 8.5*  --  8.5* 8.1*  --   --   HCT 28.7*   < > 26.5* 25.0*  --  25.0* 25.8*  --   --   PLT 160  --  137*  --   --   --  137*  --   --   HEPARINUNFRC  --   --   --   --   --   --   --   --  0.18*  CREATININE 3.74*   < > 4.16*  --  4.61*  --  3.53* 3.48*  --    < > = values in this interval not displayed.     Estimated Creatinine Clearance: 28.3 mL/min (A) (by C-G formula based on SCr of 3.48 mg/dL (H)).   Medical History: Past Medical History:  Diagnosis Date   Atrial fibrillation ([place])    Benign essential HTN 07/30/2014   Bradycardia 01/25/2015   CHF (congestive heart failure) (HCC)    Chronic anticoagulation 29/51/8841   Chronic systolic CHF (congestive heart failure), NYHA class 3 ([place]) 08/30/2014   Overview:  Global ef 30%   CKD (chronic kidney disease)    Class 3 severe obesity in adult ([place]) 04/28/2017   Coronary artery disease    Diabetes mellitus without complication (HCC)    Diabetic neuropathy ([place]) 07/28/2014   Dysrhythmia    Erectile dysfunction    High risk medication use 05/26/2018   Hypertension    Hypertensive heart disease with heart failure ([place]) 07/30/2014   Lumbago    LV dysfunction 04/28/2017   Mixed hyperlipidemia 07/28/2014   OSA (obstructive sleep apnea)    CPAP   Paroxysmal atrial fibrillation ([place]) 07/28/2014   Sinus node dysfunction ([place]) 04/21/2016   Testicular  hypofunction    Uncontrolled type 2 diabetes mellitus with microalbuminuric diabetic nephropathy 07/28/2014   Assessment: 66 yo male on chronic Xarelto for afib.  Now s/p CABG with MAZE, and on CRRT.  Remains in afib.  Pharmacy asked to resume anticoagulation with IV heparin.    Initial heparin level below goal on 1200 units/hr. No overt bleeding or complications noted.  Goal of Therapy:  Heparin level 0.3-0.5 Monitor platelets by anticoagulation protocol: Yes   Plan:  Increase IV heparin without bolus to 1400 units/hr. Check heparin level in 8 hrs Daily heparin level and CBC.  Erin Hearing PharmD., BCPS Clinical Pharmacist 11/16/2021 9:06 PM

## 2021-11-16 NOTE — Progress Notes (Signed)
Pharmacy Antibiotic Note  [name] [name] is a 66 y.o. male admitted on 11/12/2021 with CAD now s/p CABG on 5/23. 5/24 patient developed post-cardiotomy shock, now with possibility of an infectious component. Pharmacy has been consulted for vancomycin and cefepime dosing.  CRRT started yesterday afternoon.  Vancomycin random level from this morning was 17 (trough goal 15-20), indicating the need for another dose. Cefepime has also adjusted for CRRT appropriately.  Plan: Vancomycin 1250 mg q 24 hrs while on CRRT. Cefepime 2g q 12 hrs while on CRRT. Monitor CRRT, C/S, duration of therapy, vancomycin levels as indicated, and de-escalation    Height: 5\' 10"  (177.8 cm) Weight: 133.2 kg (293 lb 10.4 oz) IBW/kg (Calculated) : 73  Temp (24hrs), Avg:97.1 F (36.2 C), Min:96.1 F (35.6 C), Max:97.8 F (36.6 C)  Recent Labs  Lab 11/13/21 0528 11/13/21 1613 11/14/21 0149 11/14/21 0437 11/14/21 0500 11/14/21 1039 11/14/21 1147 11/14/21 1414 11/14/21 1612 11/15/21 0359 11/15/21 0454 11/15/21 1547 11/15/21 2357 11/16/21 0418  WBC 22.3* 28.3*  --   --  21.8*  --   --   --   --  16.1*  --   --   --  14.5*  CREATININE 1.87* 2.60*  --   --  3.74*  --   --   --  3.66* 4.16*  --  4.61*  --  3.53*  LATICACIDVEN  --   --  3.5* 2.3*  --   --  3.2* 2.5*  --   --   --   --  1.5  --   VANCOTROUGH  --   --   --   --   --  20  --   --   --   --   --   --   --   --   VANCORANDOM  --   --   --   --   --   --   --   --   --   --  15  --   --  17     Estimated Creatinine Clearance: 27.9 mL/min (A) (by C-G formula based on SCr of 3.53 mg/dL (H)).    No Known Allergies  Antimicrobials this admission: 5/23-5/25 periop cefazolin  Cefepime 5/25>> Vancomycin 5/23>>   Dose adjustments this admission: Cefepime 1g q12h > cefepime 2g q12h on 5/25 > cefepime 1g q24h on 5/26  Levels this admission: 5/25 1000 vanc trough: 20 > no dose 5/26 0454 vanc random: 15 > vancomycin 1g IV x1 5/27 AM vanc random = 17  - start vanc 1250 q 24  Microbiology results: 5/25 BCx: NGTD  Thank you for including pharmacy in the care of this patient.  [place] Crane, Roylene Reason, BCCP Clinical Pharmacist  11/16/2021 11:11 AM   [facility] pharmacy phone numbers are listed on [email or web address]

## 2021-11-16 NOTE — Progress Notes (Signed)
eLink Physician-Brief Progress Note Patient Name: [name] [name] DOB: 1956/01/17 MRN: [id number]   Date of Service  11/16/2021  HPI/Events of Note  Patient asking for a breathing treatment now, he has a Xopenex treatment due at 2:15 AM.  eICU Interventions  Respiratory instruction sent to RT to give Xopenex treatment scheduled for 2:15 now (at 1 AM).        Kerry Kass Shaneika Rossa 11/16/2021, 12:49 AM

## 2021-11-16 NOTE — Progress Notes (Signed)
Advanced Heart Failure Rounding Note  PCP-Cardiologist: None   Subjective:    Started CVVHD yesterday. Pulling 200/hr. Minimal urine output.   Remains on milrinone 0.25 + dual pressors, NE 3, VP 0.03.  Co-ox  57%   Abx started 5/25 for suspected septic shock (vancomycin/cefepime), Lactic acid has cleared AF. WBC down, 21>>16K > 14K Blood Cx NGTD   Remains in Afib w/ RVR, 100-120s. Amio gtt at 60/hr    No BM. Not passing gas. Feels a bit better with fluid removal. Weights inaccurate. Denies angina.   Echo: Difficult study. LV EF 55-60%, RV probably normal, no pericardial effusion.    Objective:   Weight Range: 133.2 kg Body mass index is 42.13 kg/m.   Vital Signs:   Temp:  [96.1 F (35.6 C)-98.8 F (37.1 C)] 97.3 F (36.3 C) (05/27 0615) Pulse Rate:  [100-175] 100 (05/27 0830) Resp:  [14-36] 22 (05/27 0830) BP: (83-158)/(45-139) 109/67 (05/27 0830) SpO2:  [90 %-100 %] 98 % (05/27 0830) Weight:  [133.2 kg] 133.2 kg (05/27 0500) Last BM Date : 11/14/21  Weight change: Filed Weights   11/13/21 0222 11/14/21 0630 11/16/21 0500  Weight: 134.1 kg 123.6 kg 133.2 kg    Intake/Output:   Intake/Output Summary (Last 24 hours) at 11/16/2021 0855 Last data filed at 11/16/2021 0800 Gross per 24 hour  Intake 2951.16 ml  Output 5060 ml  Net -2108.84 ml       Physical Exam    General: Sitting in chair. No resp difficulty HEENT: normal Neck: supple. RIJ HD cath Carotids 2+ bilat; no bruits. No lymphadenopathy or thryomegaly appreciated. Cor: PMI nondisplaced. Irregular tachy  No rubs, gallops or murmurs. Lungs: clear Abdomen: obese  soft, nontender, ++ distended. No hepatosplenomegaly. No bruits or masses. Hypoactive bowel sounds. Extremities: no cyanosis, clubbing, rash, 3+ edema Neuro: alert & orientedx3, cranial nerves grossly intact. moves all 4 extremities w/o difficulty. Affect pleasant  Telemetry   Afib 100-120 Personally reviewed   Labs     CBC Recent Labs    11/15/21 0359 11/15/21 0410 11/16/21 0414 11/16/21 0418  WBC 16.1*  --   --  14.5*  HGB 8.6*   < > 8.5* 8.1*  HCT 26.5*   < > 25.0* 25.8*  MCV 94.6  --   --  95.9  PLT 137*  --   --  137*   < > = values in this interval not displayed.    Basic Metabolic Panel Recent Labs    11/13/21 1613 11/13/21 1615 11/15/21 1547 11/16/21 0414 11/16/21 0418  NA 134*   < > 135 133* 132*  K 4.8   < > 3.6 4.0 4.0  CL 104   < > 98  --  95*  CO2 18*   < > 18*  --  22  GLUCOSE 267*   < > 171*  --  134*  BUN 28*   < > 42*  --  31*  CREATININE 2.60*   < > 4.61*  --  3.53*  CALCIUM 7.5*   < > 6.7*  --  7.0*  MG 2.6*  --   --   --  2.4  PHOS  --   --  5.8*  --  4.4   < > = values in this interval not displayed.    Liver Function Tests Recent Labs    11/15/21 0359 11/15/21 1547 11/16/21 0418  AST 24  --  29  ALT 7  --  8  ALKPHOS 39  --  46  BILITOT 0.6  --  0.9  PROT 6.2*  --  6.3*  ALBUMIN 3.5 3.4* 3.3*  3.4*    Recent Labs    11/15/21 0359  AMYLASE 38    Cardiac Enzymes No results for input(s): CKTOTAL, CKMB, CKMBINDEX, TROPONINI in the last 72 hours.  BNP: BNP (last 3 results) No results for input(s): BNP in the last 8760 hours.  ProBNP (last 3 results) No results for input(s): PROBNP in the last 8760 hours.   D-Dimer No results for input(s): DDIMER in the last 72 hours. Hemoglobin A1C No results for input(s): HGBA1C in the last 72 hours. Fasting Lipid Panel No results for input(s): CHOL, HDL, LDLCALC, TRIG, CHOLHDL, LDLDIRECT in the last 72 hours. Thyroid Function Tests No results for input(s): TSH, T4TOTAL, T3FREE, THYROIDAB in the last 72 hours.  Invalid input(s): FREET3  Other results:   Imaging    DG Chest Port 1 View  Result Date: 11/15/2021 CLINICAL DATA:  Central line placement EXAM: PORTABLE CHEST 1 VIEW COMPARISON:  11/15/2021 FINDINGS: Single frontal view of the chest demonstrates interval removal of the enteric  catheter. Right internal jugular Cordis is stable. Interval placement of a left internal jugular catheter tip overlying the superior vena cava. Postsurgical changes from prior CABG. Cardiac silhouette remains enlarged, with stable central vascular congestion. Lung volumes are diminished, without focal airspace disease, effusion, or pneumothorax. No acute bony abnormalities. IMPRESSION: 1. No complication after left internal jugular catheter placement. 2. Continued low lung volumes and central vascular congestion. Electronically Signed   By: Randa Ngo M.D.   On: 11/15/2021 16:28   DG Abd 2 Views  Result Date: 11/15/2021 CLINICAL DATA:  Ileus, abdominal distension EXAM: ABDOMEN - 2 VIEW COMPARISON:  Radiograph 11/14/2021 FINDINGS: Nasogastric tube tip and side port overlie the stomach. Slight improvement in gaseous distension of bowel small measures up to 3.0 cm, previously up to 3.5 cm. No evidence of free intraperitoneal gas on upright x-ray. IMPRESSION: Slight improvement in bowel distention in comparison to prior exam. Electronically Signed   By: Maurine Simmering M.D.   On: 11/15/2021 10:43     Medications:     Scheduled Medications:  acetaminophen  1,000 mg Oral Q6H   Or   acetaminophen (TYLENOL) oral liquid 160 mg/5 mL  1,000 mg Per Tube Q6H   aspirin EC  325 mg Oral Daily   Or   aspirin  324 mg Per Tube Daily   bisacodyl  10 mg Oral Daily   Or   bisacodyl  10 mg Rectal Daily   bisacodyl  10 mg Rectal Once   Chlorhexidine Gluconate Cloth  6 each Topical Daily   docusate sodium  200 mg Oral Daily   enoxaparin (LOVENOX) injection  30 mg Subcutaneous Q24H   fluticasone furoate-vilanterol  1 puff Inhalation Daily   guaiFENesin  1,200 mg Oral BID   insulin aspart  1-3 Units Subcutaneous Q4H   insulin detemir  12 Units Subcutaneous Q12H   ipratropium-albuterol  3 mL Nebulization QID   lidocaine  2 patch Transdermal Q24H   metoCLOPramide (REGLAN) injection  10 mg Intravenous Q6H    mupirocin cream   Topical BID   pantoprazole  40 mg Oral Daily   rosuvastatin  20 mg Oral Daily   sodium chloride flush  10-40 mL Intracatheter Q12H   sodium chloride flush  3 mL Intravenous Q12H   vancomycin variable dose per unstable renal function (pharmacist dosing)  Does not apply See admin instructions    Infusions:   prismasol BGK 4/2.5 400 mL/hr at 11/16/21 0406    prismasol BGK 4/2.5 400 mL/hr at 11/16/21 0407   sodium chloride Stopped (11/14/21 0549)   sodium chloride     sodium chloride 20 mL/hr at 11/12/21 1509   amiodarone 60 mg/hr (11/16/21 0800)   ceFEPime (MAXIPIME) IV Stopped (11/15/21 2234)   lactated ringers Stopped (11/13/21 2251)   lactated ringers 10 mL/hr at 11/16/21 0800   milrinone 0.25 mcg/kg/min (11/16/21 0800)   norepinephrine (LEVOPHED) Adult infusion 4 mcg/min (11/16/21 0800)   prismasol BGK 4/2.5 1,500 mL/hr at 11/16/21 0816   vasopressin 0.03 Units/min (11/16/21 0800)    PRN Medications: sodium chloride, heparin, HYDROmorphone (DILAUDID) injection, ondansetron (ZOFRAN) IV, oxyCODONE, sodium chloride, sodium chloride flush, sodium chloride flush    Patient Profile   66 y.o. male with history of CM with recovered EF, persistent atrial fibrillation, DM II, CKD, sick sinus syndrome, OSA, prior smoker. Underwent CABG X 3 and MAZE/PVI on 05/23>>post-op decrease in UOP and AKI with volume overload>>postcardiotomy shock requiring milrinone and phenylephrine .   Pre-op echo with EF 50-55%. Post-op echo very poor study but LV EF looks preserved. Unable to visualized RV. No significant pericardial effusion.   Assessment/Plan   1. Shock: Echo reviewed, LV EF 55-60%, RV poorly visualized but probably near-normal, no significant pericardial effusion. Suspect sepsis physiology with distributive shock.  Improved with broad spectrum abx. Lactate has cleared. Bcx 5/25 remain NGTD. - Continue milrinone 0.25.  - Continue NE and VP to maintain MAP > 65. - Continue  vancomycin/cefepime.  2. Acute on chronic primarily diastolic CHF: LV EF 98-92%, RV poorly visualized but probably near-normal, no significant pericardial effusion.  Patient is markedly volume overloaded, suspect primarily right heart failure physiology.  CVVHD started on 5/26. Pulling 200/hr - Remains volume overloaded. Continue volume removal - Place TEDs 3. AKI on CKD stage 3: Baseline SCr 1.3-1.5 .  Suspect due to shock/ATN as well as elevated renal venous pressure in setting of volume overload, ileus, abdominal distention. CVVHD started 5/26. Remains nearly anuric - Continue CVVHD - Renal following 4. CAD: S/p CABG. No s/s ischemia - Continue ASA, statin.  5. Atrial fibrillation with RVR: Has had Maze and LA appendage clip at time of CABG on 11/12/21 - in Afib w/ RVR today  - Eventually will need anticoagulation.  - Continue amiodarone 60 mg/hr.  6. DM2: SSI.  7. ID: WBCs 22>>16K > 14K, Septic shock. Lactate cleared - Continue vancomycin/cefepime.  - Blood culture NGTD 8. Ileus: NGT.  Currently NPO.  - Continue Reglan.  - Agree with Relastor per CCM  9. Pulmonary: CXR today with probable atelectasis. He is on 7L HFNC.  Ileus with abdominal distention worsens respiratory mechanics and suspect component of pulmonary edema as well.  - Diurese/ CVVH for volume removal  - Continue NGT decompression & Reglan - empiric abx.  - CCM following.   10. DM2 - on SSI   CRITICAL CARE Performed by: Glori Bickers  Total critical care time: 35 minutes  Critical care time was exclusive of separately billable procedures and treating other patients.  Critical care was necessary to treat or prevent imminent or life-threatening deterioration.  Critical care was time spent personally by me (independent of midlevel providers or residents) on the following activities: development of treatment plan with patient and/or surrogate as well as nursing, discussions with consultants, evaluation of  patient's response to treatment, examination of patient,  obtaining history from patient or surrogate, ordering and performing treatments and interventions, ordering and review of laboratory studies, ordering and review of radiographic studies, pulse oximetry and re-evaluation of patient's condition.   Length of Stay: [place], MD  11/16/2021, 8:55 AM  Advanced Heart Failure Team Pager [phone] (M-F; 7a - 5p)  Please contact [facility] for night-coverage after hours (5p -7a ) and weekends on [email or web address]

## 2021-11-16 NOTE — Evaluation (Signed)
Physical Therapy Evaluation Patient Details Name: [name] [name] MRN: [id number] DOB: 1955-09-15 Today's Date: 11/16/2021  History of Present Illness  Pt is a 66 y.o. male admitted 11/12/21 for multivessel CABG with Maze procedure; extubated 5/24. Postop course complicated by oliguria and NSVT vs. Afib/RVR, shock, worsening respiratory status, suspected ileus. Pt also with worsening renal function; CRRT initiated 5/26. PMH includes CAD, CHF, CKD, DM2, HTN, afib (chronic anticoagulation), OSA.   Clinical Impression  Pt presents with an overall decrease in functional mobility secondary to above. 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 Initiated educ re: sternal precautions, positioning, activity recommendations and importance of mobility. Today, pt able to perform brief bout of standing activity with min-modA; mobility limited by fatigue and CRRT lines. Pt demonstrates good baseline strength despite significant fatigue; hopeful he will progress well with mobility in order to d/c with HHPT services. Will follow acutely to address established goals.    SpO2 95% on 8L O2 HFNC; HR 110s    Recommendations for follow up therapy are one component of a multi-disciplinary discharge planning process, led by the attending physician.  Recommendations may be updated based on patient status, additional functional criteria and insurance authorization.  Follow Up Recommendations Home health PT    Assistance Recommended at Discharge Intermittent Supervision/Assistance  Patient can return home with the following  A little help with walking and/or transfers;A little help with bathing/dressing/bathroom;Assistance with cooking/housework;Assist for transportation;Help with stairs or ramp for entrance    Equipment Recommendations  (TBD)  Recommendations for Other Services       Functional Status Assessment Patient has had a  recent decline in their functional status and demonstrates the ability to make significant improvements in function in a reasonable and predictable amount of time.     Precautions / Restrictions        Mobility  Bed Mobility Overal bed mobility: Needs Assistance Bed Mobility: Sit to Supine       Sit to supine: Mod assist, +2 for safety/equipment   General bed mobility comments: ModA for BLE management with return to supine; +2 assist for lines/CRRT management    Transfers Overall transfer level: Needs assistance Equipment used: None Transfers: Sit to/from Stand, Bed to chair/wheelchair/BSC Sit to Stand: Min assist, +2 safety/equipment   Step pivot transfers: Mod assist, +2 safety/equipment       General transfer comment: cues for hand placement on knees and use of momentum to power up to standing from [facility], minA for trunk elevation and stability; modA to maintain balance with pivotal steps from [facility] to bed (~4') while hugging heart pillow; +2 assist from RN for lines/CRRT management    Ambulation/Gait                  Stairs            Wheelchair Mobility    Modified Rankin (Stroke Patients Only)       Balance Overall balance assessment: Needs assistance Sitting-balance support: No upper extremity supported, Feet supported Sitting balance-Leahy Scale: Fair     Standing balance support: No upper extremity supported Standing balance-Leahy Scale: Fair Standing balance comment: can static stand without UE support, min guard; requires assist for posterior pericare and washup                             Pertinent Vitals/Pain Pain Assessment Pain Assessment: Faces Faces  Pain Scale: Hurts little more Pain Location: sternal incision, generalized Pain Descriptors / Indicators: Discomfort, Guarding, Tiring Pain Intervention(s): Monitored during session, Limited activity within patient's tolerance    Home Living Family/patient expects to be  discharged to:: Private residence Living Arrangements: Children;Non-relatives/Friends (ex-wife, two sons) Available Help at Discharge: Family;Available 24 hours/day Type of Home: House Home Access: Ramped entrance       Home Layout: One level Home Equipment: BSC/3in1;Other (comment);Rolling Walker (2 wheels) (walking stick) Additional Comments: pt likely has 3in1 and RW from his mom    Prior Function Prior Level of Function : Independent/Modified Independent;Driving             Mobility Comments: Indep with intermittent use of walking stick; uses electric scooter provided by grocery stores when out shopping (longer ambulation distance limited by SOB/fatigue); mows his 3 acres on riding mower; retired Dealer, 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, general practice        Extremity/Trunk Assessment   Upper Extremity Assessment Upper Extremity Assessment: Generalized weakness    Lower Extremity Assessment Lower Extremity Assessment: Generalized weakness       Communication   Communication: No difficulties  Cognition Arousal/Alertness: Awake/alert Behavior During Therapy: WFL for tasks assessed/performed Overall Cognitive Status: Within Functional Limits for tasks assessed                                          General Comments General comments (skin integrity, edema, etc.): session limited by CRRT and fatigue after having BM on BSC; pt with DOE 3-4/4 with minimal activity. SpO2 95% on 8L O2 HFNC, HR 110s    Exercises     Assessment/Plan    PT Assessment Patient needs continued PT services  PT Problem List Decreased strength;Decreased activity tolerance;Decreased balance;Decreased mobility;Decreased knowledge of use of DME;Decreased knowledge of precautions;Cardiopulmonary status limiting activity       PT Treatment Interventions DME instruction;Gait training;Functional mobility training;Therapeutic  activities;Therapeutic exercise;Balance training;Patient/family education    PT Goals (Current goals can be found in the Care Plan section)  Acute Rehab PT Goals Patient Stated Goal: return home PT Goal Formulation: With patient Time For Goal Achievement: 11/30/21 Potential to Achieve Goals: Good    Frequency Min 3X/week     Co-evaluation               AM-PAC PT "6 Clicks" Mobility  Outcome Measure Help needed turning from your back to your side while in a flat bed without using bedrails?: A Lot Help needed moving from lying on your back to sitting on the side of a flat bed without using bedrails?: A Lot Help needed moving to and from a bed to a chair (including a wheelchair)?: A Lot Help needed standing up from a chair using your arms (e.g., wheelchair or bedside chair)?: A Little Help needed to walk in hospital room?: A Lot Help needed climbing 3-5 steps with a railing? : A Lot 6 Click Score: 13    End of Session   Activity Tolerance: Patient tolerated treatment well;Patient limited by fatigue Patient left: in bed;with call bell/phone within reach;with nursing/sitter in room Nurse Communication: Mobility status PT Visit Diagnosis: Other abnormalities of gait and mobility (R26.89);Muscle weakness (generalized) (M62.81)    Time: 2595-6387 PT Time Calculation (min) (ACUTE ONLY): 24 min   Charges:   PT Evaluation $PT Eval  Moderate Complexity: 1 Mod     Mabeline Caras, [place], DPT Acute Rehabilitation Services  Pager [phone] Office [place] 11/16/2021, 1:36 PM

## 2021-11-16 NOTE — Progress Notes (Signed)
Patient ID: [name] [name], male   DOB: [birth year], 66 y.o.   MRN: [id number] [facility] Progress Note   Assessment/ Plan:   1.  Acute kidney injury on chronic kidney disease stage IIIa: Oliguric, likely ischemic ATN in the setting of septic versus cardiogenic shock.  Started on CRRT yesterday for extracorporeal volume unloading after he had fixed urine output on Lasix drip that was inadequate for volume unloading.  Overnight net -1.5 L; his dry weight appears to be around 280 pounds and he currently remains about 6 kg over that (which should be able to obtain this within the next 24 to 48 hours on CRRT) 2.  Coronary artery disease status post three-vessel CABG: Appears to be doing well from postoperative standpoint and is on aspirin/statin. 3.  Atrial fibrillation: Status post MAZE procedure at the time of CABG and now normal sinus rhythm with PACs; anticoagulation per primary service. 4.  Anemia: Likely associated with recent surgery and postoperative inflammatory response.  We will continue to trend hemoglobin and hematocrit.  Subjective:   Reports to be feeling fair, denies any shortness of breath and has some intermittent chest discomfort from surgical site   Objective:   BP 112/61   Pulse (!) 115   Temp (!) 97.3 F (36.3 C)   Resp 20   Ht 5\' 10"  (1.778 m)   Wt 133.2 kg   SpO2 93%   BMI 42.13 kg/m   Intake/Output Summary (Last 24 hours) at 11/16/2021 8295 Last data filed at 11/16/2021 0700 Gross per 24 hour  Intake 3197.26 ml  Output 4709 ml  Net -1511.74 ml   Weight change:   Physical Exam: Gen: Comfortably resting in recliner CVS: Pulse regular tachycardia, S1 and S2 normal, clean midline sternotomy incision Resp: Diminished breath sounds over bases, no distinct rales Abd: Soft, obese, nontender, bowel sounds normal Ext: 1+ lower extremity/dependent edema  Imaging: DG Chest Port 1 View  Result Date: 11/15/2021 CLINICAL DATA:  Central line placement EXAM:  PORTABLE CHEST 1 VIEW COMPARISON:  11/15/2021 FINDINGS: Single frontal view of the chest demonstrates interval removal of the enteric catheter. Right internal jugular Cordis is stable. Interval placement of a left internal jugular catheter tip overlying the superior vena cava. Postsurgical changes from prior CABG. Cardiac silhouette remains enlarged, with stable central vascular congestion. Lung volumes are diminished, without focal airspace disease, effusion, or pneumothorax. No acute bony abnormalities. IMPRESSION: 1. No complication after left internal jugular catheter placement. 2. Continued low lung volumes and central vascular congestion. Electronically Signed   By: Randa Ngo M.D.   On: 11/15/2021 16:28   DG CHEST PORT 1 VIEW  Result Date: 11/15/2021 CLINICAL DATA:  Pneumothorax. EXAM: PORTABLE CHEST 1 VIEW COMPARISON:  Nov 14, 2021. FINDINGS: Stable cardiomegaly. Status post coronary bypass graft. Nasogastric tube is seen entering stomach. Right internal jugular venous sheath is noted. Left-sided chest tube has been removed without pneumothorax. Bibasilar atelectasis is noted. Bony thorax is unremarkable. IMPRESSION: Left-sided chest tube has been removed without pneumothorax. Stable bibasilar atelectasis. Electronically Signed   By: Marijo Conception M.D.   On: 11/15/2021 08:48   DG Abd 2 Views  Result Date: 11/15/2021 CLINICAL DATA:  Ileus, abdominal distension EXAM: ABDOMEN - 2 VIEW COMPARISON:  Radiograph 11/14/2021 FINDINGS: Nasogastric tube tip and side port overlie the stomach. Slight improvement in gaseous distension of bowel small measures up to 3.0 cm, previously up to 3.5 cm. No evidence of free intraperitoneal gas on upright x-ray. IMPRESSION: Slight improvement  in bowel distention in comparison to prior exam. Electronically Signed   By: Maurine Simmering M.D.   On: 11/15/2021 10:43    Labs: BMET Recent Labs  Lab 11/13/21 0528 11/13/21 0805 11/13/21 1613 11/13/21 1615 11/14/21 0500  11/14/21 0506 11/14/21 1611 11/14/21 1612 11/15/21 0359 11/15/21 0410 11/15/21 1547 11/16/21 0414 11/16/21 0418  NA 136   < > 134*   < > 132*   < > 136 134* 136 133* 135 133* 132*  K 4.2   < > 4.8   < > 4.6   < > 3.5 3.7 3.8 3.7 3.6 4.0 4.0  CL 107  --  104  --  99  --   --  100 99  --  98  --  95*  CO2 20*  --  18*  --  18*  --   --  20* 24  --  18*  --  22  GLUCOSE 155*  --  267*  --  449*  --   --  242* 171*  --  171*  --  134*  BUN 25*  --  28*  --  33*  --   --  34* 37*  --  42*  --  31*  CREATININE 1.87*  --  2.60*  --  3.74*  --   --  3.66* 4.16*  --  4.61*  --  3.53*  CALCIUM 7.1*  --  7.5*  --  7.1*  --   --  6.9* 7.1*  --  6.7*  --  7.0*  PHOS  --   --   --   --   --   --   --   --   --   --  5.8*  --  4.4   < > = values in this interval not displayed.   CBC Recent Labs  Lab 11/13/21 1613 11/13/21 1615 11/14/21 0500 11/14/21 0506 11/15/21 0359 11/15/21 0410 11/16/21 0414 11/16/21 0418  WBC 28.3*  --  21.8*  --  16.1*  --   --  14.5*  HGB 10.2*   < > 9.4*   < > 8.6* 8.5* 8.5* 8.1*  HCT 31.3*   < > 28.7*   < > 26.5* 25.0* 25.0* 25.8*  MCV 95.4  --  94.4  --  94.6  --   --  95.9  PLT 192  --  160  --  137*  --   --  137*   < > = values in this interval not displayed.    Medications:     acetaminophen  1,000 mg Oral Q6H   Or   acetaminophen (TYLENOL) oral liquid 160 mg/5 mL  1,000 mg Per Tube Q6H   aspirin EC  325 mg Oral Daily   Or   aspirin  324 mg Per Tube Daily   bisacodyl  10 mg Oral Daily   Or   bisacodyl  10 mg Rectal Daily   bisacodyl  10 mg Rectal Once   Chlorhexidine Gluconate Cloth  6 each Topical Daily   docusate sodium  200 mg Oral Daily   enoxaparin (LOVENOX) injection  30 mg Subcutaneous Q24H   fluticasone furoate-vilanterol  1 puff Inhalation Daily   insulin aspart  1-3 Units Subcutaneous Q4H   insulin detemir  12 Units Subcutaneous Q12H   ipratropium-albuterol  3 mL Nebulization QID   lidocaine  2 patch Transdermal Q24H    metoCLOPramide (REGLAN) injection  10 mg Intravenous Q6H  mupirocin cream   Topical BID   pantoprazole  40 mg Oral Daily   rosuvastatin  20 mg Oral Daily   sodium chloride flush  10-40 mL Intracatheter Q12H   sodium chloride flush  3 mL Intravenous Q12H   vancomycin variable dose per unstable renal function (pharmacist dosing)   Does not apply See admin instructions   Elmarie Shiley, MD 11/16/2021, 7:39 AM

## 2021-11-17 DIAGNOSIS — Z951 Presence of aortocoronary bypass graft: Secondary | ICD-10-CM | POA: Diagnosis not present

## 2021-11-17 LAB — RENAL FUNCTION PANEL
Albumin: 3.1 g/dL — ABNORMAL LOW (ref 3.5–5.0)
Albumin: 3.1 g/dL — ABNORMAL LOW (ref 3.5–5.0)
Anion gap: 11 (ref 5–15)
Anion gap: 10 (ref 5–15)
BUN: 29 mg/dL — ABNORMAL HIGH (ref 8–23)
BUN: 28 mg/dL — ABNORMAL HIGH (ref 8–23)
CO2: 24 mmol/L (ref 22–32)
CO2: 24 mmol/L (ref 22–32)
Calcium: 7.6 mg/dL — ABNORMAL LOW (ref 8.9–10.3)
Calcium: 8 mg/dL — ABNORMAL LOW (ref 8.9–10.3)
Chloride: 100 mmol/L (ref 98–111)
Chloride: 100 mmol/L (ref 98–111)
Creatinine, Ser: 2.87 mg/dL — ABNORMAL HIGH (ref 0.61–1.24)
Creatinine, Ser: 2.67 mg/dL — ABNORMAL HIGH (ref 0.61–1.24)
GFR, Estimated: 23 mL/min — ABNORMAL LOW
GFR, Estimated: 25 mL/min — ABNORMAL LOW
Glucose, Bld: 167 mg/dL — ABNORMAL HIGH (ref 70–99)
Glucose, Bld: 164 mg/dL — ABNORMAL HIGH (ref 70–99)
Phosphorus: 3.5 mg/dL (ref 2.5–4.6)
Phosphorus: 3.1 mg/dL (ref 2.5–4.6)
Potassium: 4 mmol/L (ref 3.5–5.1)
Potassium: 4.3 mmol/L (ref 3.5–5.1)
Sodium: 135 mmol/L (ref 135–145)
Sodium: 134 mmol/L — ABNORMAL LOW (ref 135–145)

## 2021-11-17 LAB — GLUCOSE, CAPILLARY
Glucose-Capillary: 167 mg/dL — ABNORMAL HIGH (ref 70–99)
Glucose-Capillary: 178 mg/dL — ABNORMAL HIGH (ref 70–99)
Glucose-Capillary: 155 mg/dL — ABNORMAL HIGH (ref 70–99)
Glucose-Capillary: 151 mg/dL — ABNORMAL HIGH (ref 70–99)
Glucose-Capillary: 155 mg/dL — ABNORMAL HIGH (ref 70–99)

## 2021-11-17 LAB — CBC
HCT: 24.6 % — ABNORMAL LOW (ref 39.0–52.0)
Hemoglobin: 7.8 g/dL — ABNORMAL LOW (ref 13.0–17.0)
MCH: 30.2 pg (ref 26.0–34.0)
MCHC: 31.7 g/dL (ref 30.0–36.0)
MCV: 95.3 fL (ref 80.0–100.0)
Platelets: 175 10*3/uL (ref 150–400)
RBC: 2.58 MIL/uL — ABNORMAL LOW (ref 4.22–5.81)
RDW: 15.1 % (ref 11.5–15.5)
WBC: 12.9 10*3/uL — ABNORMAL HIGH (ref 4.0–10.5)
nRBC: 0 % (ref 0.0–0.2)

## 2021-11-17 LAB — HEPATIC FUNCTION PANEL
ALT: 8 U/L (ref 0–44)
AST: 36 U/L (ref 15–41)
Albumin: 3.1 g/dL — ABNORMAL LOW (ref 3.5–5.0)
Alkaline Phosphatase: 48 U/L (ref 38–126)
Bilirubin, Direct: 0.1 mg/dL (ref 0.0–0.2)
Indirect Bilirubin: 0.7 mg/dL (ref 0.3–0.9)
Total Bilirubin: 0.8 mg/dL (ref 0.3–1.2)
Total Protein: 6.1 g/dL — ABNORMAL LOW (ref 6.5–8.1)

## 2021-11-17 LAB — MAGNESIUM: Magnesium: 2.6 mg/dL — ABNORMAL HIGH (ref 1.7–2.4)

## 2021-11-17 LAB — HEPARIN LEVEL (UNFRACTIONATED)
Heparin Unfractionated: 0.26 [IU]/mL — ABNORMAL LOW (ref 0.30–0.70)
Heparin Unfractionated: 0.26 [IU]/mL — ABNORMAL LOW (ref 0.30–0.70)
Heparin Unfractionated: 0.29 IU/mL — ABNORMAL LOW (ref 0.30–0.70)

## 2021-11-17 LAB — COOXEMETRY PANEL
Carboxyhemoglobin: 0.7 % (ref 0.5–1.5)
Methemoglobin: <0.7 % (ref 0.0–1.5)
O2 Saturation: 62.9 %
Total hemoglobin: 8.4 g/dL — ABNORMAL LOW (ref 12.0–16.0)

## 2021-11-17 MED ORDER — AMIODARONE LOAD VIA INFUSION
150.0000 mg | Freq: Once | INTRAVENOUS | Status: AC
  Administered 2021-11-17: 150 mg via INTRAVENOUS
  Filled 2021-11-17: qty 83.34

## 2021-11-17 NOTE — Progress Notes (Signed)
NAME:  [name] [name], MRN:  [id number], DOB:  11-11-1955, LOS: 5 ADMISSION DATE:  11/12/2021, CONSULTATION DATE:  11/13/2021 REFERRING MD:  Dr. Aundra Dubin , CHIEF COMPLAINT:  Respiratory distress    History of Present Illness:  [name] [name] is a 66 y.o. male with a PMH significant for but not limited to CAD now s/p CABG, systolic congestive heart failure, CKD, type 2 diabetes, HTN, A-fib chronically anticoagulated, and OSA who presented to the ED 5/23 for elective CABG with MAZE procedure Dr. Lawson Fiscal.  Patient was extubated per heart failure protocol with no incident by by afternoon of 5/24 PCCM was consulted for progressive increase work of breathing.   Pertinent  Medical History  CAD no s/p CABG, systolic congestive heart failure, CKD, type 2 diabetes, HTN, A-fib chronically anticoagulated, and OSA   Significant Hospital Events: Including procedures, antibiotic start and stop dates in addition to other pertinent events   5/23 underwent CABG with MAZE procedure with Dr. Lawson Fiscal 5/24 PCCM consulted for progressive increased work of breathing  5/25 Urine output remained low overnight along with soft BP which prompted addition of vaso and low dose bicarb drip  5/26 CRRT started  Interim History / Subjective:  No events. Pressor requirements improved. O2 needs improving.  Objective   Blood pressure 116/71, pulse (!) 121, temperature 98.1 F (36.7 C), resp. rate (!) 30, height 5\' 10"  (1.778 m), weight 130.5 kg, SpO2 92 %. CVP:  [14 mmHg-21 mmHg] 18 mmHg      Intake/Output Summary (Last 24 hours) at 11/17/2021 0836 Last data filed at 11/17/2021 0800 Gross per 24 hour  Intake 2742.87 ml  Output 6917 ml  Net -4174.13 ml    Filed Weights   11/14/21 0630 11/16/21 0500 11/17/21 0400  Weight: 123.6 kg 133.2 kg 130.5 kg    Examination: No distress Abdomen is softer Heart sounds tachy Lung sounds diminished at bases Improving anasarca Weak, moves all 4 ext  Coox 62% on milrinone  0.25, vaso 0.03  Resolved Hospital Problem list     Assessment & Plan:  CAD now S/P CABG x3 -Underwent elective CABG with Dr. Lawson Fiscal 5/23 Hx of HTN/HLD  -Home medications include; Cozaar, Lopressor, and Crestor  Hx of a-fib anticoagulated at baseline- remains in afib Continue shock; cardiogenic and/or septic  P: Primary management per CTS Pressors for MAP goal greater than 65, can probably wean off vasopressin today Heparin gtt Empiric antibiotics started 5/26, will end in 3 days, not sure what we are treating   Acute hypoxemic respiratory failure: related to ileus and volume overload, improved, dropped him to 4L this am Hs of OSA with use on CPAP at baseline  P: Continue supplemental oxygen with SPO2 goal > 90 CPAP qHS  Elevate HOB Mobilize as able Schedule BDs Continue mucinex and flutter valve   History of type 2 diabetes  -Home medications include Glipizide, Metformin, and Farxiga  P: Levemir and SSI, seems okay for now   Oliguric AKI superimposed on CKD stage 3a -Creatinine 1.59 with GFR 497 11/07/2021, currently creatinine 2.6 with GFR 26 by afternoon of 5/24 P: Push fluid removal with CRRT, appreciate nephrology help  Illeus- improved P: Continue suppositories Mobilization will help 1 more day with clear liquid diet then can probably advance  Best Practice (right click and "Reselect all SmartList Selections" daily)   Diet/type: clear liquid diet DVT prophylaxis: heparin gtt GI prophylaxis: PPI Lines: Central line Foley:  Yes, and it is still needed Code Status:  full code Last  date of multidisciplinary goals of care discussion: Per primary   Critical care time: 14 min  [name] Emery MD PCCM

## 2021-11-17 NOTE — Progress Notes (Signed)
Patient ID: [name] [name], male   DOB: 07-May-1956, 66 y.o.   MRN: [id number] [facility] Progress Note   Assessment/ Plan:   1.  Acute kidney injury on chronic kidney disease stage IIIa: Oliguric overnight (195 cc), likely ischemic ATN in the setting of septic versus cardiogenic shock.  Started on CRRT 5/26 for extracorporeal volume unloading after he had fixed urine output on Lasix drip that was inadequate for volume unloading.  Net -4.2 L overnight, will continue CRRT at the current prescription with ultrafiltration of 100-200 cc/h to goal CVP around 6-8 cmH2O/dry weight of 280 pounds.  He remains on fixed-dose vasopressin but is off Levophed. 2.  Coronary artery disease status post three-vessel CABG: Appears to be doing well from postoperative standpoint and is on aspirin/statin. 3.  Atrial fibrillation: Status post MAZE procedure at the time of CABG and now normal sinus rhythm with PACs; anticoagulation per primary service. 4.  Anemia: Likely associated with recent surgery and postoperative inflammatory response.  We will continue to trend hemoglobin and hematocrit.  Subjective:   Without problems overnight-tolerating CRRT without issues and was net -4.2 L overnight.  He denies any active complaints and is currently getting nebulizer.   Objective:   BP 112/75   Pulse (!) 114   Temp 97.9 F (36.6 C)   Resp (!) 23   Ht 5\' 10"  (1.778 m)   Wt 130.5 kg   SpO2 94%   BMI 41.28 kg/m   Intake/Output Summary (Last 24 hours) at 11/17/2021 0755 Last data filed at 11/17/2021 0700 Gross per 24 hour  Intake 2759.21 ml  Output 7022 ml  Net -4262.79 ml   Weight change: -2.7 kg  Physical Exam: Gen: Comfortably sitting up in bed, getting breathing treatment CVS: Pulse regular tachycardia, S1 and S2 normal, clean midline sternotomy incision Resp: Diminished breath sounds over bases, no distinct rales Abd: Soft, obese, nontender, bowel sounds normal Ext: 1+ lower extremity/dependent  edema  Imaging: DG Chest Port 1 View  Result Date: 11/16/2021 CLINICAL DATA:  Status post CABG. EXAM: PORTABLE CHEST 1 VIEW COMPARISON:  11/15/2021 FINDINGS: Right IJ Cordis is stable with tip projecting over the proximal SVC. There is a left IJ catheter with tip projecting over the mid SVC. Postoperative changes from median sternotomy and CABG procedure. Stable cardiac enlargement with pulmonary vascular congestion. No pneumothorax or airspace consolidation. IMPRESSION: Stable cardiac enlargement and pulmonary vascular congestion. Electronically Signed   By: Kerby Moors M.D.   On: 11/16/2021 09:54   DG Chest Port 1 View  Result Date: 11/15/2021 CLINICAL DATA:  Central line placement EXAM: PORTABLE CHEST 1 VIEW COMPARISON:  11/15/2021 FINDINGS: Single frontal view of the chest demonstrates interval removal of the enteric catheter. Right internal jugular Cordis is stable. Interval placement of a left internal jugular catheter tip overlying the superior vena cava. Postsurgical changes from prior CABG. Cardiac silhouette remains enlarged, with stable central vascular congestion. Lung volumes are diminished, without focal airspace disease, effusion, or pneumothorax. No acute bony abnormalities. IMPRESSION: 1. No complication after left internal jugular catheter placement. 2. Continued low lung volumes and central vascular congestion. Electronically Signed   By: Randa Ngo M.D.   On: 11/15/2021 16:28   DG Abd 2 Views  Result Date: 11/15/2021 CLINICAL DATA:  Ileus, abdominal distension EXAM: ABDOMEN - 2 VIEW COMPARISON:  Radiograph 11/14/2021 FINDINGS: Nasogastric tube tip and side port overlie the stomach. Slight improvement in gaseous distension of bowel small measures up to 3.0 cm, previously up to  3.5 cm. No evidence of free intraperitoneal gas on upright x-ray. IMPRESSION: Slight improvement in bowel distention in comparison to prior exam. Electronically Signed   By: Maurine Simmering M.D.   On:  11/15/2021 10:43    Labs: BMET Recent Labs  Lab 11/14/21 0500 11/14/21 0506 11/14/21 1612 11/15/21 0359 11/15/21 0410 11/15/21 1547 11/16/21 0414 11/16/21 0418 11/16/21 1600 11/17/21 0436  NA 132*   < > 134* 136 133* 135 133* 132* 133* 135  K 4.6   < > 3.7 3.8 3.7 3.6 4.0 4.0 4.5 4.0  CL 99  --  100 99  --  98  --  95* 99 100  CO2 18*  --  20* 24  --  18*  --  22 22 24   GLUCOSE 449*  --  242* 171*  --  171*  --  134* 230* 167*  BUN 33*  --  34* 37*  --  42*  --  31* 33* 29*  CREATININE 3.74*  --  3.66* 4.16*  --  4.61*  --  3.53* 3.48* 2.87*  CALCIUM 7.1*  --  6.9* 7.1*  --  6.7*  --  7.0* 7.3* 7.6*  PHOS  --   --   --   --   --  5.8*  --  4.4 5.0* 3.5   < > = values in this interval not displayed.   CBC Recent Labs  Lab 11/14/21 0500 11/14/21 0506 11/15/21 0359 11/15/21 0410 11/16/21 0414 11/16/21 0418 11/17/21 0436  WBC 21.8*  --  16.1*  --   --  14.5* 12.9*  HGB 9.4*   < > 8.6* 8.5* 8.5* 8.1* 7.8*  HCT 28.7*   < > 26.5* 25.0* 25.0* 25.8* 24.6*  MCV 94.4  --  94.6  --   --  95.9 95.3  PLT 160  --  137*  --   --  137* 175   < > = values in this interval not displayed.    Medications:     acetaminophen  1,000 mg Oral Q6H   Or   acetaminophen (TYLENOL) oral liquid 160 mg/5 mL  1,000 mg Per Tube Q6H   aspirin EC  325 mg Oral Daily   Or   aspirin  324 mg Per Tube Daily   bisacodyl  10 mg Rectal Q0600   Chlorhexidine Gluconate Cloth  6 each Topical Daily   docusate sodium  200 mg Oral Daily   fluticasone furoate-vilanterol  1 puff Inhalation Daily   guaiFENesin  1,200 mg Oral BID   insulin aspart  0-24 Units Subcutaneous Q4H   insulin detemir  12 Units Subcutaneous Q12H   ipratropium-albuterol  3 mL Nebulization QID   lidocaine  2 patch Transdermal Q24H   metoCLOPramide (REGLAN) injection  10 mg Intravenous Q6H   pantoprazole  40 mg Oral Daily   rosuvastatin  20 mg Oral Daily   sodium chloride flush  10-40 mL Intracatheter Q12H   sodium chloride flush  3  mL Intravenous Q12H   Elmarie Shiley, MD 11/17/2021, 7:55 AM

## 2021-11-17 NOTE — Progress Notes (Signed)
Advanced Heart Failure Rounding Note  PCP-Cardiologist: None   Subjective:    Started CVVHD5/26. Pulling 200/hr. Minimal urine output. (< 200cc)  Remains on milrinone 0.25 +  VP 0.03. NE off. Co-ox 62%. CVP 14  Abx started 5/25 for suspected septic shock (vancomycin/cefepime), Lactic acid has cleared AF. WBC down, 21>>16K > 14K Blood Cx NGTD   Remains in Afib w/ RVR, 110s-120s. Amio gtt remains at 60/hr  Ileus improving. Remains on clears.   Feels a bit better. Still weak. Denies SOB or CP. Had 2 good BMs. Ab softer  Echo: Difficult study. LV EF 55-60%, RV probably normal, no pericardial effusion.    Objective:   Weight Range: 130.5 kg Body mass index is 41.28 kg/m.   Vital Signs:   Temp:  [97 F (36.1 C)-98.4 F (36.9 C)] 98.1 F (36.7 C) (05/28 0930) Pulse Rate:  [101-130] 117 (05/28 0930) Resp:  [14-37] 21 (05/28 0930) BP: (93-136)/(40-115) 110/66 (05/28 0930) SpO2:  [86 %-99 %] 91 % (05/28 0930) Weight:  [130.5 kg] 130.5 kg (05/28 0400) Last BM Date : 11/16/21  Weight change: Filed Weights   11/14/21 0630 11/16/21 0500 11/17/21 0400  Weight: 123.6 kg 133.2 kg 130.5 kg    Intake/Output:   Intake/Output Summary (Last 24 hours) at 11/17/2021 0944 Last data filed at 11/17/2021 0900 Gross per 24 hour  Intake 2763.09 ml  Output 6779 ml  Net -4015.91 ml       Physical Exam   General:  Sitting in chair No resp difficulty HEENT: normal Neck: supple. LIJ HD cath  Carotids 2+ bilat; no bruits. No lymphadenopathy or thryomegaly appreciated. Cor: PMI nondisplaced. Regular rate & rhythm. No rubs, gallops or murmurs. Lungs: clear Abdomen: soft, nontender, + distended. No hepatosplenomegaly. No bruits or masses. Good bowel sounds. Extremities: no cyanosis, clubbing, rash, 2+ edema Neuro: alert & orientedx3, cranial nerves grossly intact. moves all 4 extremities w/o difficulty. Affect pleasant   Telemetry   Afib 110-120s Personally reviewed   Labs     CBC Recent Labs    11/16/21 0418 11/17/21 0436  WBC 14.5* 12.9*  HGB 8.1* 7.8*  HCT 25.8* 24.6*  MCV 95.9 95.3  PLT 137* 032    Basic Metabolic Panel Recent Labs    11/16/21 0418 11/16/21 1600 11/17/21 0436  NA 132* 133* 135  K 4.0 4.5 4.0  CL 95* 99 100  CO2 22 22 24   GLUCOSE 134* 230* 167*  BUN 31* 33* 29*  CREATININE 3.53* 3.48* 2.87*  CALCIUM 7.0* 7.3* 7.6*  MG 2.4  --  2.6*  PHOS 4.4 5.0* 3.5    Liver Function Tests Recent Labs    11/16/21 0418 11/16/21 1600 11/17/21 0436  AST 29  --  36  ALT 8  --  8  ALKPHOS 46  --  48  BILITOT 0.9  --  0.8  PROT 6.3*  --  6.1*  ALBUMIN 3.3*  3.4* 3.2* 3.1*  3.1*    Recent Labs    11/15/21 0359  AMYLASE 38    Cardiac Enzymes No results for input(s): CKTOTAL, CKMB, CKMBINDEX, TROPONINI in the last 72 hours.  BNP: BNP (last 3 results) No results for input(s): BNP in the last 8760 hours.  ProBNP (last 3 results) No results for input(s): PROBNP in the last 8760 hours.   D-Dimer No results for input(s): DDIMER in the last 72 hours. Hemoglobin A1C No results for input(s): HGBA1C in the last 72 hours. Fasting Lipid Panel No  results for input(s): CHOL, HDL, LDLCALC, TRIG, CHOLHDL, LDLDIRECT in the last 72 hours. Thyroid Function Tests No results for input(s): TSH, T4TOTAL, T3FREE, THYROIDAB in the last 72 hours.  Invalid input(s): FREET3  Other results:   Imaging    No results found.   Medications:     Scheduled Medications:  acetaminophen  1,000 mg Oral Q6H   Or   acetaminophen (TYLENOL) oral liquid 160 mg/5 mL  1,000 mg Per Tube Q6H   aspirin EC  325 mg Oral Daily   Or   aspirin  324 mg Per Tube Daily   bisacodyl  10 mg Rectal Q0600   Chlorhexidine Gluconate Cloth  6 each Topical Daily   docusate sodium  200 mg Oral Daily   fluticasone furoate-vilanterol  1 puff Inhalation Daily   guaiFENesin  1,200 mg Oral BID   insulin aspart  0-24 Units Subcutaneous Q4H   insulin detemir  12  Units Subcutaneous Q12H   ipratropium-albuterol  3 mL Nebulization QID   lidocaine  2 patch Transdermal Q24H   metoCLOPramide (REGLAN) injection  10 mg Intravenous Q6H   pantoprazole  40 mg Oral Daily   rosuvastatin  20 mg Oral Daily   sodium chloride flush  10-40 mL Intracatheter Q12H   sodium chloride flush  3 mL Intravenous Q12H    Infusions:   prismasol BGK 4/2.5 400 mL/hr at 11/16/21 1655    prismasol BGK 4/2.5 400 mL/hr at 11/16/21 1653   sodium chloride Stopped (11/14/21 0549)   sodium chloride     sodium chloride 20 mL/hr at 11/12/21 1509   amiodarone 60 mg/hr (11/17/21 0900)   ceFEPime (MAXIPIME) IV 2 g (11/17/21 0936)   heparin 1,500 Units/hr (11/17/21 0900)   lactated ringers 10 mL/hr at 11/17/21 0900   lactated ringers 10 mL/hr at 11/16/21 2300   milrinone 0.25 mcg/kg/min (11/17/21 0900)   norepinephrine (LEVOPHED) Adult infusion Stopped (11/17/21 0615)   prismasol BGK 4/2.5 1,500 mL/hr at 11/17/21 0825   vancomycin Stopped (11/16/21 1149)   vasopressin 0.02 Units/min (11/17/21 0900)    PRN Medications: sodium chloride, heparin, HYDROmorphone (DILAUDID) injection, ondansetron (ZOFRAN) IV, oxyCODONE, sodium chloride, sodium chloride flush, sodium chloride flush    Patient Profile   66 y.o. male with history of CM with recovered EF, persistent atrial fibrillation, DM II, CKD, sick sinus syndrome, OSA, prior smoker. Underwent CABG X 3 and MAZE/PVI on 05/23>>post-op decrease in UOP and AKI with volume overload>>postcardiotomy shock requiring milrinone and phenylephrine .   Pre-op echo with EF 50-55%. Post-op echo very poor study but LV EF looks preserved. Unable to visualized RV. No significant pericardial effusion.   Assessment/Plan   1. Shock: Echo reviewed, LV EF 55-60%, RV poorly visualized but probably near-normal, no significant pericardial effusion. Suspect sepsis physiology with distributive shock.  Improved with broad spectrum abx. Lactate has cleared. Bcx  5/25 remain NGTD. Now off NE. Co-ox 62%  - With persistent rapid AF will decrease milrinone to 0.125.[name] Continue VP 0.03 to keep MAP ~ 70 or greater until all fluid removed. Can switch to midodrine eventually as needed - Continue cefepime. Can stop vanc 2. Acute on chronic primarily diastolic CHF: LV EF 31-54%, RV poorly visualized but probably near-normal, no significant pericardial effusion.  Patient is markedly volume overloaded, suspect primarily right heart failure physiology.  CVVHD started on 5/26. Pulling 200/hr CVP 14 - Remains volume overloaded.  Pre-op weight 280 (today he is 293 -> 287) Continue volume removal - Continue TEDs 3. AKI  on CKD stage 3: Baseline SCr 1.3-1.5 .  Suspect due to shock/ATN as well as elevated renal venous pressure in setting of volume overload, ileus, abdominal distention. CVVHD started 5/26. Remains nearly anuric (u/o < 200cc) - Continue CVVHD - Renal following 4. CAD: S/p CABG. No s/s ischemia - Continue ASA, statin.  5. Atrial fibrillation with RVR: Has had Maze and LA appendage clip at time of CABG on 11/12/21 - Remains in Afib w/ RVR today. Drop milrinone to 0.125 - Now on heparin - Continue amiodarone 60 mg/hr.  6. DM2: SSI.  7. ID: WBCs 22>>16K > 14K -> 12.9K Septic shock. Lactate cleared - Continue cefepime. Stop vanc - Blood culture NGTD 8. Ileus: NGT.  Improving - Continue Reglan.  - Received Relastor 5/27  - Can likely advance diet tomorrow 9. Pulmonary: Respiratory status improving with fluid removal and abx - CVVH for volume removal  - CCM following.   10. DM2 - on SSI   CRITICAL CARE Performed by: Glori Bickers  Total critical care time: 35 minutes  Critical care time was exclusive of separately billable procedures and treating other patients.  Critical care was necessary to treat or prevent imminent or life-threatening deterioration.  Critical care was time spent personally by me (independent of midlevel providers or  residents) on the following activities: development of treatment plan with patient and/or surrogate as well as nursing, discussions with consultants, evaluation of patient's response to treatment, examination of patient, obtaining history from patient or surrogate, ordering and performing treatments and interventions, ordering and review of laboratory studies, ordering and review of radiographic studies, pulse oximetry and re-evaluation of patient's condition.   Length of Stay: [place], MD  11/17/2021, 9:44 AM  Advanced Heart Failure Team Pager [phone] (M-F; 7a - 5p)  Please contact [facility] for night-coverage after hours (5p -7a ) and weekends on [email or web address]

## 2021-11-17 NOTE — Progress Notes (Signed)
[name] [name] for IV heparin Indication: atrial fibrillation  No Known Allergies  Patient Measurements: Height: 5\' 10"  (177.8 cm) Weight: 130.5 kg (287 lb 11.2 oz) IBW/kg (Calculated) : 73 Heparin Dosing Weight: ~ 100 kg  Vital Signs: Temp: 96.8 F (36 C) (05/28 1230) Temp Source: Bladder (05/28 0700) BP: 104/58 (05/28 1400) Pulse Rate: 109 (05/28 1400)  Labs: Recent Labs    11/15/21 0359 11/15/21 0410 11/16/21 0414 11/16/21 0418 11/16/21 1600 11/16/21 2025 11/17/21 0436 11/17/21 1300  HGB 8.6*   < > 8.5* 8.1*  --   --  7.8*  --   HCT 26.5*   < > 25.0* 25.8*  --   --  24.6*  --   PLT 137*  --   --  137*  --   --  175  --   HEPARINUNFRC  --   --   --   --   --  0.18* 0.26* 0.26*  CREATININE 4.16*   < >  --  3.53* 3.48*  --  2.87*  --    < > = values in this interval not displayed.     Estimated Creatinine Clearance: 33.9 mL/min (A) (by C-G formula based on SCr of 2.87 mg/dL (H)).   Medical History: Past Medical History:  Diagnosis Date   Atrial fibrillation ([place])    Benign essential HTN 07/30/2014   Bradycardia 01/25/2015   CHF (congestive heart failure) (HCC)    Chronic anticoagulation 38/75/6433   Chronic systolic CHF (congestive heart failure), NYHA class 3 ([place]) 08/30/2014   Overview:  Global ef 30%   CKD (chronic kidney disease)    Class 3 severe obesity in adult ([place]) 04/28/2017   Coronary artery disease    Diabetes mellitus without complication (HCC)    Diabetic neuropathy ([place]) 07/28/2014   Dysrhythmia    Erectile dysfunction    High risk medication use 05/26/2018   Hypertension    Hypertensive heart disease with heart failure ([place]) 07/30/2014   Lumbago    LV dysfunction 04/28/2017   Mixed hyperlipidemia 07/28/2014   OSA (obstructive sleep apnea)    CPAP   Paroxysmal atrial fibrillation ([place]) 07/28/2014   Sinus node dysfunction ([place]) 04/21/2016   Testicular hypofunction    Uncontrolled type 2 diabetes  mellitus with microalbuminuric diabetic nephropathy 07/28/2014   Assessment: 66 yo male on chronic Xarelto for afib.  Now s/p CABG with MAZE, and on CRRT.  Remains in afib.  Pharmacy asked to resume anticoagulation with IV heparin.    Heparin level below 0.26 goal on 1500 units/hr. No issues with infusion; No overt bleeding or complications reported  Goal of Therapy:  Heparin level 0.3-0.5 Monitor platelets by anticoagulation protocol: Yes   Plan:  Increase IV heparin to 1600 units/hr. Recheck heparin level in 8 hrs Daily heparin level and CBC.  [place] Crane, Roylene Reason, BCCP Clinical Pharmacist  11/17/2021 2:42 PM   [facility] pharmacy phone numbers are listed on [email or web address]

## 2021-11-17 NOTE — Progress Notes (Signed)
[place] for IV heparin Indication: atrial fibrillation  No Known Allergies  Patient Measurements: Height: 5\' 10"  (177.8 cm) Weight: 130.5 kg (287 lb 11.2 oz) IBW/kg (Calculated) : 73 Heparin Dosing Weight: ~ 100 kg  Vital Signs: Temp: 98.1 F (36.7 C) (05/28 0500) Temp Source: Bladder (05/28 0500) BP: 121/62 (05/28 0500) Pulse Rate: 123 (05/28 0500)  Labs: Recent Labs    11/15/21 0359 11/15/21 0410 11/15/21 1547 11/16/21 0414 11/16/21 0418 11/16/21 1600 11/16/21 2025 11/17/21 0436  HGB 8.6*   < >  --  8.5* 8.1*  --   --  7.8*  HCT 26.5*   < >  --  25.0* 25.8*  --   --  24.6*  PLT 137*  --   --   --  137*  --   --  175  HEPARINUNFRC  --   --   --   --   --   --  0.18* 0.26*  CREATININE 4.16*  --  4.61*  --  3.53* 3.48*  --   --    < > = values in this interval not displayed.     Estimated Creatinine Clearance: 28 mL/min (A) (by C-G formula based on SCr of 3.48 mg/dL (H)).   Medical History: Past Medical History:  Diagnosis Date   Atrial fibrillation ([place])    Benign essential HTN 07/30/2014   Bradycardia 01/25/2015   CHF (congestive heart failure) (HCC)    Chronic anticoagulation 38/25/0539   Chronic systolic CHF (congestive heart failure), NYHA class 3 ([place]) 08/30/2014   Overview:  Global ef 30%   CKD (chronic kidney disease)    Class 3 severe obesity in adult ([place]) 04/28/2017   Coronary artery disease    Diabetes mellitus without complication (HCC)    Diabetic neuropathy ([place]) 07/28/2014   Dysrhythmia    Erectile dysfunction    High risk medication use 05/26/2018   Hypertension    Hypertensive heart disease with heart failure ([place]) 07/30/2014   Lumbago    LV dysfunction 04/28/2017   Mixed hyperlipidemia 07/28/2014   OSA (obstructive sleep apnea)    CPAP   Paroxysmal atrial fibrillation ([place]) 07/28/2014   Sinus node dysfunction ([place]) 04/21/2016   Testicular hypofunction    Uncontrolled type 2 diabetes  mellitus with microalbuminuric diabetic nephropathy 07/28/2014   Assessment: 66 yo male on chronic Xarelto for afib.  Now s/p CABG with MAZE, and on CRRT.  Remains in afib.  Pharmacy asked to resume anticoagulation with IV heparin.    Heparin level below 0.26 goal on 1400 units/hr. No issues with infusion; No overt bleeding or complications reported  Goal of Therapy:  Heparin level 0.3-0.5 Monitor platelets by anticoagulation protocol: Yes   Plan:  Increase IV heparin without bolus to 1500 units/hr. Check heparin level in 8 hrs Daily heparin level and CBC.  Georga Bora, PharmD Clinical Pharmacist 11/17/2021 5:11 AM Please check AMION for all [place] numbers

## 2021-11-18 ENCOUNTER — Inpatient Hospital Stay (HOSPITAL_COMMUNITY): Payer: Medicare HMO

## 2021-11-18 DIAGNOSIS — Z951 Presence of aortocoronary bypass graft: Secondary | ICD-10-CM | POA: Diagnosis not present

## 2021-11-18 DIAGNOSIS — N179 Acute kidney failure, unspecified: Secondary | ICD-10-CM | POA: Diagnosis not present

## 2021-11-18 LAB — RENAL FUNCTION PANEL
Albumin: 3 g/dL — ABNORMAL LOW (ref 3.5–5.0)
Albumin: 3 g/dL — ABNORMAL LOW (ref 3.5–5.0)
Anion gap: 10 (ref 5–15)
Anion gap: 8 (ref 5–15)
BUN: 26 mg/dL — ABNORMAL HIGH (ref 8–23)
BUN: 31 mg/dL — ABNORMAL HIGH (ref 8–23)
CO2: 24 mmol/L (ref 22–32)
CO2: 26 mmol/L (ref 22–32)
Calcium: 8.2 mg/dL — ABNORMAL LOW (ref 8.9–10.3)
Calcium: 7.8 mg/dL — ABNORMAL LOW (ref 8.9–10.3)
Chloride: 102 mmol/L (ref 98–111)
Chloride: 103 mmol/L (ref 98–111)
Creatinine, Ser: 2.57 mg/dL — ABNORMAL HIGH (ref 0.61–1.24)
Creatinine, Ser: 2.76 mg/dL — ABNORMAL HIGH (ref 0.61–1.24)
GFR, Estimated: 27 mL/min — ABNORMAL LOW (ref >60)
GFR, Estimated: 24 mL/min — ABNORMAL LOW (ref >60)
Glucose, Bld: 165 mg/dL — ABNORMAL HIGH (ref 70–99)
Glucose, Bld: 183 mg/dL — ABNORMAL HIGH (ref 70–99)
Phosphorus: 3.2 mg/dL (ref 2.5–4.6)
Phosphorus: 3 mg/dL (ref 2.5–4.6)
Potassium: 4.4 mmol/L (ref 3.5–5.1)
Potassium: 4.5 mmol/L (ref 3.5–5.1)
Sodium: 136 mmol/L (ref 135–145)
Sodium: 137 mmol/L (ref 135–145)

## 2021-11-18 LAB — CBC
HCT: 25.9 % — ABNORMAL LOW (ref 39.0–52.0)
Hemoglobin: 8.1 g/dL — ABNORMAL LOW (ref 13.0–17.0)
MCH: 30.1 pg (ref 26.0–34.0)
MCHC: 31.3 g/dL (ref 30.0–36.0)
MCV: 96.3 fL (ref 80.0–100.0)
Platelets: 190 10*3/uL (ref 150–400)
RBC: 2.69 MIL/uL — ABNORMAL LOW (ref 4.22–5.81)
RDW: 15.2 % (ref 11.5–15.5)
WBC: 12.8 10*3/uL — ABNORMAL HIGH (ref 4.0–10.5)
nRBC: 0.2 % (ref 0.0–0.2)

## 2021-11-18 LAB — GLUCOSE, CAPILLARY
Glucose-Capillary: 180 mg/dL — ABNORMAL HIGH (ref 70–99)
Glucose-Capillary: 211 mg/dL — ABNORMAL HIGH (ref 70–99)
Glucose-Capillary: 186 mg/dL — ABNORMAL HIGH (ref 70–99)
Glucose-Capillary: 182 mg/dL — ABNORMAL HIGH (ref 70–99)
Glucose-Capillary: 184 mg/dL — ABNORMAL HIGH (ref 70–99)

## 2021-11-18 LAB — HEPATIC FUNCTION PANEL
ALT: 9 U/L (ref 0–44)
AST: 26 U/L (ref 15–41)
Albumin: 2.9 g/dL — ABNORMAL LOW (ref 3.5–5.0)
Alkaline Phosphatase: 51 U/L (ref 38–126)
Bilirubin, Direct: 0.1 mg/dL (ref 0.0–0.2)
Indirect Bilirubin: 0.7 mg/dL (ref 0.3–0.9)
Total Bilirubin: 0.8 mg/dL (ref 0.3–1.2)
Total Protein: 6.5 g/dL (ref 6.5–8.1)

## 2021-11-18 LAB — COOXEMETRY PANEL
Carboxyhemoglobin: <0.3 % — ABNORMAL LOW (ref 0.5–1.5)
Methemoglobin: <0.7 % (ref 0.0–1.5)
O2 Saturation: 63.4 %
Total hemoglobin: 8.4 g/dL — ABNORMAL LOW (ref 12.0–16.0)

## 2021-11-18 LAB — HEPARIN LEVEL (UNFRACTIONATED)
Heparin Unfractionated: 0.32 IU/mL (ref 0.30–0.70)
Heparin Unfractionated: 0.43 IU/mL (ref 0.30–0.70)

## 2021-11-18 LAB — MAGNESIUM: Magnesium: 2.8 mg/dL — ABNORMAL HIGH (ref 1.7–2.4)

## 2021-11-18 MED ORDER — SODIUM CHLORIDE 0.9 % IV SOLN
250.0000 mL | INTRAVENOUS | Status: DC
  Administered 2021-11-26: 250 mL via INTRAVENOUS

## 2021-11-18 MED ORDER — METHYLNALTREXONE BROMIDE 12 MG/0.6ML ~~LOC~~ SOLN
12.0000 mg | Freq: Once | SUBCUTANEOUS | Status: AC
  Administered 2021-11-18: 12 mg via SUBCUTANEOUS
  Filled 2021-11-18: qty 0.6

## 2021-11-18 MED ORDER — PROSOURCE PLUS PO LIQD
30.0000 mL | Freq: Three times a day (TID) | ORAL | Status: DC
Start: 2021-11-18 — End: 2021-11-19
  Administered 2021-11-18: 30 mL via ORAL
  Filled 2021-11-18 (×2): qty 30

## 2021-11-18 MED ORDER — NOREPINEPHRINE 4 MG/250ML-% IV SOLN: 2.0000 ug/min | INTRAVENOUS | Status: DC

## 2021-11-18 MED ORDER — RENA-VITE PO TABS
1.0000 | ORAL_TABLET | Freq: Every day | ORAL | Status: DC
  Administered 2021-11-18 – 2021-12-03 (×15): 1 via ORAL
  Filled 2021-11-18 (×14): qty 1

## 2021-11-18 MED ORDER — VASOPRESSIN 20 UNITS/100 ML INFUSION FOR SHOCK
0.0000 [IU]/min | INTRAVENOUS | Status: DC
  Administered 2021-11-18 – 2021-11-19 (×3): 0.03 [IU]/min via INTRAVENOUS
  Filled 2021-11-18 (×4): qty 100

## 2021-11-18 MED ORDER — MIDODRINE HCL 5 MG PO TABS
10.0000 mg | ORAL_TABLET | Freq: Three times a day (TID) | ORAL | Status: DC
  Administered 2021-11-18: 10 mg via ORAL
  Filled 2021-11-18 (×2): qty 2

## 2021-11-18 MED ORDER — NOREPINEPHRINE 16 MG/250ML-% IV SOLN
0.0000 ug/min | INTRAVENOUS | Status: DC
  Administered 2021-11-20: 2 ug/min via INTRAVENOUS
  Filled 2021-11-18: qty 250

## 2021-11-18 NOTE — Evaluation (Signed)
Occupational Therapy Evaluation Patient Details Name: [name] [name] MRN: [id number] DOB: [birth year] Today's Date: 11/18/2021   History of Present Illness Pt is a 66 y.o. male admitted 11/12/21 for multivessel CABG with Maze procedure; extubated 5/24. Postop course complicated by oliguria and NSVT vs. Afib/RVR, shock, worsening respiratory status, suspected ileus. Pt also with worsening renal function; CRRT initiated 5/26. PMH includes CAD, CHF, CKD, DM2, HTN, afib (chronic anticoagulation), OSA.   Clinical Impression   This 66 yo male admitted and underwent above presents to acute OT with PLOF of being totally independent with basic ADLs, IADLs, and driving. Currently he is setup/S-total A for basic ADLs. He will continue to benefit from acute OT without need for followup.      Recommendations for follow up therapy are one component of a multi-disciplinary discharge planning process, led by the attending physician.  Recommendations may be updated based on patient status, additional functional criteria and insurance authorization.   Follow Up Recommendations  No OT follow up    Assistance Recommended at Discharge Frequent or constant Supervision/Assistance  Patient can return home with the following A lot of help with walking and/or transfers;A lot of help with bathing/dressing/bathroom;Assistance with cooking/housework;Assistance with feeding;Help with stairs or ramp for entrance;Assist for transportation    Functional Status Assessment  Patient has had a recent decline in their functional status and demonstrates the ability to make significant improvements in function in a reasonable and predictable amount of time.  Equipment Recommendations  BSC/3in1       Precautions / Restrictions Precautions Precautions: Fall;Sternal Precaution Booklet Issued: No Precaution Comments: CRRT Restrictions Weight Bearing Restrictions: Yes Other Position/Activity Restrictions: sternal precautions       Mobility Bed Mobility               General bed mobility comments: deferred, used foot exit of bed    Transfers Overall transfer level: Needs assistance Equipment used:  (eva walker) Transfers: Sit to/from Stand Sit to Stand: Min assist, +2 safety/equipment                  Balance Overall balance assessment: Needs assistance Sitting-balance support: No upper extremity supported, Feet supported Sitting balance-Leahy Scale: Fair     Standing balance support: Bilateral upper extremity supported, During functional activity, Reliant on assistive device for balance Standing balance-Leahy Scale: Poor Standing balance comment: Reliant on minA and Eva walker                           ADL either performed or assessed with clinical judgement   ADL Overall ADL's : Needs assistance/impaired Eating/Feeding: Set up Eating/Feeding Details (indicate cue type and reason): sitting in recliner Grooming: Set up;Supervision/safety;Sitting Grooming Details (indicate cue type and reason): in recliner Upper Body Bathing: Supervision/ safety;Set up;Sitting Upper Body Bathing Details (indicate cue type and reason): in recliner Lower Body Bathing: Maximal assistance Lower Body Bathing Details (indicate cue type and reason): min A +2 sit<>stand from foot exit bed Upper Body Dressing : Moderate assistance;Sitting Upper Body Dressing Details (indicate cue type and reason): in recliner Lower Body Dressing: Total assistance Lower Body Dressing Details (indicate cue type and reason): min A +2 sit<>stand from foot exit bed Toilet Transfer: Minimal assistance;+2 for physical assistance Toilet Transfer Details (indicate cue type and reason): simulated foot exit of bed>out into hallway with EVA walker ~25 feet>sit in recliner behind him Toileting- Clothing Manipulation and Hygiene: Total assistance Toileting - Clothing Manipulation Details (indicate cue  type and reason): min A +2  sit<>stand             Vision Patient Visual Report: No change from baseline              Pertinent Vitals/Pain Pain Assessment Pain Assessment: Faces Faces Pain Scale: Hurts little more Pain Location: sternum Pain Descriptors / Indicators: Discomfort, Grimacing, Operative site guarding Pain Intervention(s): Limited activity within patient's tolerance, Monitored during session, Repositioned     Hand Dominance Right   Extremity/Trunk Assessment Upper Extremity Assessment Upper Extremity Assessment: Generalized weakness           Communication Communication Communication: No difficulties   Cognition Arousal/Alertness: Awake/alert Behavior During Therapy: WFL for tasks assessed/performed Overall Cognitive Status: Impaired/Different from baseline Area of Impairment: Memory                     Memory: Decreased recall of precautions                          Home Living Family/patient expects to be discharged to:: Private residence Living Arrangements: Children;Non-relatives/Friends (ex-wife and 2 grown sons) Available Help at Discharge: Family;Available 24 hours/day Type of Home: House Home Access: Ramped entrance     Home Layout: One level     Bathroom Shower/Tub: Occupational psychologist: Handicapped height     Home Equipment: BSC/3in1;Other (comment);Rolling Walker (2 wheels)   Additional Comments: pt likely has 3in1 and RW from his mom      Prior Functioning/Environment Prior Level of Function : Independent/Modified Independent;Driving             Mobility Comments: Indep with intermittent use of walking stick; uses electric scooter provided by grocery stores when out shopping (longer ambulation distance limited by SOB/fatigue); mows his 3 acres on riding mower; retired Dealer, but still enjoys working some ADLs Comments: stands to shower        OT Problem List: Decreased range of motion;Decreased  strength;Impaired balance (sitting and/or standing);Decreased activity tolerance;Decreased knowledge of precautions;Pain      OT Treatment/Interventions: Self-care/ADL training;DME and/or AE instruction;Patient/family education;Balance training    OT Goals(Current goals can be found in the care plan section) Acute Rehab OT Goals Patient Stated Goal: to get better and go home OT Goal Formulation: With patient Time For Goal Achievement: 12/02/21 Potential to Achieve Goals: Good  OT Frequency: Min 2X/week       AM-PAC OT "6 Clicks" Daily Activity     Outcome Measure Help from another person eating meals?: A Little Help from another person taking care of personal grooming?: A Little Help from another person toileting, which includes using toliet, bedpan, or urinal?: A Lot Help from another person bathing (including washing, rinsing, drying)?: A Lot Help from another person to put on and taking off regular upper body clothing?: A Lot Help from another person to put on and taking off regular lower body clothing?: Total 6 Click Score: 13   End of Session Equipment Utilized During Treatment: Gait belt (eva walker) Nurse Communication:  (RN and I as well as NT worked together to get pt up and ambulating)  Activity Tolerance: Patient limited by fatigue Patient left: in chair;with call bell/phone within reach;with nursing/sitter in room;with family/visitor present  OT Visit Diagnosis: Unsteadiness on feet (R26.81);Other abnormalities of gait and mobility (R26.89);Muscle weakness (generalized) (M62.81);Pain Pain - part of body:  (incisional)  Time: 1419-1440 OT Time Calculation (min): 21 min Charges:  OT General Charges $OT Visit: 1 Visit OT Evaluation $OT Eval Moderate Complexity: [place], OTR/L Acute [employer] [facility] [phone] Office [phone]    [name] 11/18/2021, 5:33 PM

## 2021-11-18 NOTE — Progress Notes (Signed)
Patient refused CPAP for the night  

## 2021-11-18 NOTE — Progress Notes (Signed)
[address]       [place], [address]             [phone]                                       4 Days Post-Op Procedure(s) (LRB): CORONARY ARTERY BYPASS GRAFTING (CABG) x  3 USING LEFT INTERNAL MAMMARY ARTERY AND RIGHT GREATER SAPHENOUS VEIN (N/A) MAZE (N/A) TRANSESOPHAGEAL ECHOCARDIOGRAM (TEE) (N/A) ENDOVEIN HARVEST OF GREATER SAPHENOUS VEIN     Events: Remains in afib On CRRT _______________________________________________________________ Vitals: BP 109/61   Pulse (!) 107   Temp (!) 97.3 F (36.3 C)   Resp (!) 24   Ht 5\' 10"  (1.778 m)   Wt 133.2 kg   SpO2 97%   BMI 42.13 kg/m       Filed Weights    11/13/21 0222 11/14/21 0630 11/16/21 0500  Weight: 134.1 kg 123.6 kg 133.2 kg        - Neuro: arousable   - Cardiovascular: afib, rate in 100s             Drips: amio 60, milr 0.25, vaso 0.01.   CVP:  [23 mmHg-46 mmHg] 29 mmHg   - Pulm: HFNC.  Coarse BS >        ABG Labs (Brief)          Component Value Date/Time    PHART 7.332 (L) 11/15/2021 0410    PCO2ART 44.4 11/15/2021 0410    PO2ART 74 (L) 11/15/2021 0410    HCO3 24.2 11/16/2021 0414    TCO2 26 11/16/2021 0414    ACIDBASEDEF 3.0 (H) 11/16/2021 0414    O2SAT 57 11/16/2021 0418        - Abd: ND - Extremity: warm   .Intake/Output      05/26 0701 05/27 0700 05/27 0701 05/28 0700   P.O. 100    I.V. (mL/kg) 2977.3 (22.4) 234 (1.8)   Other 20    NG/GT     IV Piggyback 100 100   Total Intake(mL/kg) 3197.3 (24) 334 (2.5)   Urine (mL/kg/hr) 115 (0)    Emesis/NG output     Other 4594 786   Chest Tube     Total Output 4709 786   Net -1511.7 -452             _______________________________________________________________ Labs:     Latest Ref Rng & Units 11/16/2021    4:18 AM 11/16/2021    4:14 AM 11/15/2021    4:10 AM  CBC  WBC 4.0 - 10.5 K/uL 14.5        Hemoglobin 13.0 - 17.0 g/dL 8.1   8.5   8.5    Hematocrit 39.0 - 52.0 % 25.8   25.0   25.0    Platelets 150  - 400 K/uL 137              Latest Ref Rng & Units 11/16/2021    4:18 AM 11/16/2021    4:14 AM 11/15/2021    3:47 PM  CMP  Glucose 70 - 99 mg/dL 134     171    BUN 8 - 23 mg/dL 31     42    Creatinine 0.61 - 1.24 mg/dL 3.53     4.61    Sodium 135 - 145 mmol/L 132   133   135    Potassium 3.5 -  5.1 mmol/L 4.0   4.0   3.6    Chloride 98 - 111 mmol/L 95     98    CO2 22 - 32 mmol/L 22     18    Calcium 8.9 - 10.3 mg/dL 7.0     6.7    Total Protein 6.5 - 8.1 g/dL 6.3        Total Bilirubin 0.3 - 1.2 mg/dL 0.9        Alkaline Phos 38 - 126 U/L 46        AST 15 - 41 U/L 29        ALT 0 - 44 U/L 8            CXR: -   _______________________________________________________________   Assessment and Plan: POD  5 s/p CABG MAZE   Neuro: pain controlled CV: weaning gtts as tolerated.  Remains in afib.  On amio, and heparin  Pulm: continue pulm hyg Renal: on CRRT.  Running even today GI: on diet Heme: stable, starting heparin ID: Afebrile Endo: SSI Dispo: continue ICU care     [name] [name]

## 2021-11-18 NOTE — Progress Notes (Signed)
Patient ID: [name] [name], male   DOB: March 14, 1956, 66 y.o.   MRN: [id number]     Advanced Heart Failure Rounding Note  PCP-Cardiologist: None   Subjective:    Started CVVHD 5/26. Pulling 200/hr. Minimal urine output. (170cc).  I/O net negative 3047 with weight down.   Remains on milrinone 0.125, vasopressin and NE off. Co-ox 63%. CVP 13  Abx started 5/25 for suspected septic shock (vancomycin/cefepime).  Afebrile. WBC down, 21>>16K > 14K > 12.8K.  Blood Cx NGTD   Remains in Afib w/ RVR, 110s. Amio gtt remains at 60/hr, now on heparin gtt.   Ileus improving. Remains on clears with poor appetite.    Still weak, denies dyspnea.   Echo: Difficult study. LV EF 55-60%, RV probably normal, no pericardial effusion.    Objective:   Weight Range: 118.9 kg Body mass index is 37.62 kg/m.   Vital Signs:   Temp:  [96.6 F (35.9 C)-98.4 F (36.9 C)] 98.1 F (36.7 C) (05/29 0700) Pulse Rate:  [103-130] 111 (05/29 0700) Resp:  [15-43] 19 (05/29 0700) BP: (94-127)/(44-84) 102/64 (05/29 0700) SpO2:  [89 %-100 %] 93 % (05/29 0700) Weight:  [118.9 kg] 118.9 kg (05/29 0500) Last BM Date : 11/16/21  Weight change: Filed Weights   11/16/21 0500 11/17/21 0400 11/18/21 0500  Weight: 133.2 kg 130.5 kg 118.9 kg    Intake/Output:   Intake/Output Summary (Last 24 hours) at 11/18/2021 0719 Last data filed at 11/18/2021 0700 Gross per 24 hour  Intake 2803.83 ml  Output 5851 ml  Net -3047.17 ml      Physical Exam   General: NAD Neck: JVP 12 cm, no thyromegaly or thyroid nodule.  Lungs: Clear to auscultation bilaterally with normal respiratory effort. CV: Nondisplaced PMI.  Heart mildly tachy, irregular S1/S2, no S3/S4, no murmur.  No peripheral edema.  Abdomen: Soft, nontender, no hepatosplenomegaly, no distention.  Skin: Intact without lesions or rashes.  Neurologic: Alert and oriented x 3.  Psych: Normal affect. Extremities: No clubbing or cyanosis.  HEENT: Normal.   Telemetry    Afib 110s Personally reviewed   Labs    CBC Recent Labs    11/17/21 0436 11/18/21 0408  WBC 12.9* 12.8*  HGB 7.8* 8.1*  HCT 24.6* 25.9*  MCV 95.3 96.3  PLT 175 409   Basic Metabolic Panel Recent Labs    11/17/21 0436 11/17/21 1600 11/18/21 0408  NA 135 134* 136  K 4.0 4.3 4.4  CL 100 100 102  CO2 24 24 24   GLUCOSE 167* 164* 165*  BUN 29* 28* 26*  CREATININE 2.87* 2.67* 2.57*  CALCIUM 7.6* 8.0* 8.2*  MG 2.6*  --  2.8*  PHOS 3.5 3.1 3.2   Liver Function Tests Recent Labs    11/17/21 0436 11/17/21 1600 11/18/21 0408  AST 36  --  26  ALT 8  --  9  ALKPHOS 48  --  51  BILITOT 0.8  --  0.8  PROT 6.1*  --  6.5  ALBUMIN 3.1*  3.1* 3.1* 2.9*  3.0*   No results for input(s): LIPASE, AMYLASE in the last 72 hours. Cardiac Enzymes No results for input(s): CKTOTAL, CKMB, CKMBINDEX, TROPONINI in the last 72 hours.  BNP: BNP (last 3 results) No results for input(s): BNP in the last 8760 hours.  ProBNP (last 3 results) No results for input(s): PROBNP in the last 8760 hours.   D-Dimer No results for input(s): DDIMER in the last 72 hours. Hemoglobin A1C No results for  input(s): HGBA1C in the last 72 hours. Fasting Lipid Panel No results for input(s): CHOL, HDL, LDLCALC, TRIG, CHOLHDL, LDLDIRECT in the last 72 hours. Thyroid Function Tests No results for input(s): TSH, T4TOTAL, T3FREE, THYROIDAB in the last 72 hours.  Invalid input(s): FREET3  Other results:   Imaging    No results found.   Medications:     Scheduled Medications:  aspirin EC  325 mg Oral Daily   Or   aspirin  324 mg Per Tube Daily   bisacodyl  10 mg Rectal Q0600   Chlorhexidine Gluconate Cloth  6 each Topical Daily   docusate sodium  200 mg Oral Daily   fluticasone furoate-vilanterol  1 puff Inhalation Daily   guaiFENesin  1,200 mg Oral BID   insulin aspart  0-24 Units Subcutaneous Q4H   insulin detemir  12 Units Subcutaneous Q12H   ipratropium-albuterol  3 mL Nebulization  QID   lidocaine  2 patch Transdermal Q24H   metoCLOPramide (REGLAN) injection  10 mg Intravenous Q6H   pantoprazole  40 mg Oral Daily   rosuvastatin  20 mg Oral Daily   sodium chloride flush  10-40 mL Intracatheter Q12H   sodium chloride flush  3 mL Intravenous Q12H    Infusions:   prismasol BGK 4/2.5 400 mL/hr at 11/17/21 1812    prismasol BGK 4/2.5 400 mL/hr at 11/17/21 1812   sodium chloride Stopped (11/14/21 0549)   sodium chloride     sodium chloride 20 mL/hr at 11/12/21 1509   amiodarone 60 mg/hr (11/18/21 0700)   ceFEPime (MAXIPIME) IV Stopped (11/17/21 2131)   heparin 1,700 Units/hr (11/18/21 0700)   lactated ringers 10 mL/hr at 11/18/21 0700   lactated ringers Stopped (11/17/21 2054)   milrinone 0.125 mcg/kg/min (11/18/21 0700)   norepinephrine (LEVOPHED) Adult infusion Stopped (11/17/21 0615)   prismasol BGK 4/2.5 1,500 mL/hr at 11/17/21 1727   vancomycin Stopped (11/17/21 1139)   vasopressin Stopped (11/18/21 0558)    PRN Medications: sodium chloride, heparin, HYDROmorphone (DILAUDID) injection, ondansetron (ZOFRAN) IV, oxyCODONE, sodium chloride, sodium chloride flush, sodium chloride flush    Patient Profile   66 y.o. male with history of CM with recovered EF, persistent atrial fibrillation, DM II, CKD, sick sinus syndrome, OSA, prior smoker. Underwent CABG X 3 and MAZE/PVI on 05/23>>post-op decrease in UOP and AKI with volume overload>>postcardiotomy shock requiring milrinone and phenylephrine .   Pre-op echo with EF 50-55%. Post-op echo very poor study but LV EF looks preserved. Unable to visualized RV. No significant pericardial effusion.   Assessment/Plan   1. Shock: Echo reviewed, LV EF 55-60%, RV poorly visualized but probably near-normal, no significant pericardial effusion. Suspect sepsis physiology with distributive shock.  Improved with broad spectrum abx. Lactate has cleared. Bcx 5/25 remain NGTD. Now off NE and vasopressin. Co-ox 63%  - Stop milrinone  today.  - Can use midodrine if needed.  - Continue cefepime. Can stop vancomycin.  2. Acute on chronic primarily diastolic CHF: LV EF 44-03%, RV poorly visualized but probably near-normal, no significant pericardial effusion.  Patient is still volume overloaded, suspect primarily right heart failure physiology.  CVVHD started on 5/26. Pulling 200/hr, CVP 13 today.  - Continue CVVH today at current rate, may be able to back off tomorrow.  - Stopping milrinone as above.  - Continue TEDs 3. AKI on CKD stage 3: Baseline SCr 1.3-1.5 .  Suspect due to shock/ATN as well as elevated renal venous pressure in setting of volume overload, ileus, abdominal distention. CVVHD  started 5/26. Remains nearly anuric (u/o 170 cc) - Continue CVVHD today - Renal following 4. CAD: S/p CABG. No s/s ischemia - Continue ASA, statin.  5. Atrial fibrillation with RVR: Has had Maze and LA appendage clip at time of CABG on 11/12/21. He remains in AF with RVR. - Stop milrinone as above.  - Now on heparin - Continue amiodarone 60 mg/hr.  - Will need eventual TEE-DCCV.  6. DM2: SSI.  7. ID: WBCs 22>>16K > 14K -> 12.8K Septic shock. Lactate cleared - Continue cefepime. Stop vancomycin.  - Blood culture NGTD 8. Ileus: NGT.  Improving but appetite still poor.  - Continue Reglan.  - Received Relastor 5/27  - May need Cortrack.  9. Pulmonary: Respiratory status improving with fluid removal and abx - CVVH for volume removal  - CCM following.   10. DM2 - on SSI   CRITICAL CARE Performed by: Loralie Champagne  Total critical care time: 35 minutes  Critical care time was exclusive of separately billable procedures and treating other patients.  Critical care was necessary to treat or prevent imminent or life-threatening deterioration.  Critical care was time spent personally by me (independent of midlevel providers or residents) on the following activities: development of treatment plan with patient and/or surrogate as well  as nursing, discussions with consultants, evaluation of patient's response to treatment, examination of patient, obtaining history from patient or surrogate, ordering and performing treatments and interventions, ordering and review of laboratory studies, ordering and review of radiographic studies, pulse oximetry and re-evaluation of patient's condition.   Length of Stay: 6  Loralie Champagne, MD  11/18/2021, 7:19 AM  Advanced Heart Failure Team Pager [phone] (M-F; 7a - 5p)  Please contact [facility] for night-coverage after hours (5p -7a ) and weekends on [email or web address]

## 2021-11-18 NOTE — Progress Notes (Signed)
[facility] for IV heparin Indication: atrial fibrillation  No Known Allergies  Patient Measurements: Height: 5\' 10"  (177.8 cm) Weight: 118.9 kg (262 lb 3.2 oz) IBW/kg (Calculated) : 73 Heparin Dosing Weight: ~ 100 kg  Vital Signs: Temp: 98.2 F (36.8 C) (05/29 1400) Temp Source: Bladder (05/29 0700) BP: 104/61 (05/29 1615) Pulse Rate: 109 (05/29 1645)  Labs: Recent Labs    11/16/21 0418 11/16/21 1600 11/17/21 0436 11/17/21 1300 11/17/21 1600 11/17/21 2306 11/18/21 0408 11/18/21 0849 11/18/21 1628  HGB 8.1*  --  7.8*  --   --   --  8.1*  --   --   HCT 25.8*  --  24.6*  --   --   --  25.9*  --   --   PLT 137*  --  175  --   --   --  190  --   --   HEPARINUNFRC  --    < > 0.26*   < >  --  0.29*  --  0.32 0.43  CREATININE 3.53*   < > 2.87*  --  2.67*  --  2.57*  --   --    < > = values in this interval not displayed.     Estimated Creatinine Clearance: 36.1 mL/min (A) (by C-G formula based on SCr of 2.57 mg/dL (H)).   Medical History: Past Medical History:  Diagnosis Date   Atrial fibrillation ([place])    Benign essential HTN 07/30/2014   Bradycardia 01/25/2015   CHF (congestive heart failure) (HCC)    Chronic anticoagulation 10/62/6948   Chronic systolic CHF (congestive heart failure), NYHA class 3 ([place]) 08/30/2014   Overview:  Global ef 30%   CKD (chronic kidney disease)    Class 3 severe obesity in adult ([place]) 04/28/2017   Coronary artery disease    Diabetes mellitus without complication (HCC)    Diabetic neuropathy ([place]) 07/28/2014   Dysrhythmia    Erectile dysfunction    High risk medication use 05/26/2018   Hypertension    Hypertensive heart disease with heart failure ([place]) 07/30/2014   Lumbago    LV dysfunction 04/28/2017   Mixed hyperlipidemia 07/28/2014   OSA (obstructive sleep apnea)    CPAP   Paroxysmal atrial fibrillation ([place]) 07/28/2014   Sinus node dysfunction ([place]) 04/21/2016   Testicular hypofunction     Uncontrolled type 2 diabetes mellitus with microalbuminuric diabetic nephropathy 07/28/2014   Assessment: 66 yo male on chronic Xarelto for afib.  Now s/p CABG with MAZE, and on CRRT.  Remains in afib.  Pharmacy asked to resume anticoagulation with IV heparin.    Heparin level continues to be therapeutic at 0.43 on 1700 units/hr. No issues noted with infusion or bleeding per chart review.   Goal of Therapy:  Heparin level 0.3-0.5 Monitor platelets by anticoagulation protocol: Yes   Plan:  Continue heparin 1700 units/hr Daily heparin level and CBC Monitor for s/sx of bleeding  Thank you for including pharmacy in the care of this patient.  Zenaida Deed, PharmD PGY1 Acute Care Pharmacy Resident  Phone: [phone] 11/18/2021  5:04 PM  Please check [email or web address] for unit-specific pharmacy phone numbers.

## 2021-11-18 NOTE — Progress Notes (Signed)
[place] for IV heparin Indication: atrial fibrillation  No Known Allergies  Patient Measurements: Height: 5\' 10"  (177.8 cm) Weight: 130.5 kg (287 lb 11.2 oz) IBW/kg (Calculated) : 73 Heparin Dosing Weight: ~ 100 kg  Vital Signs: Temp: 97.5 F (36.4 C) (05/28 2300) Temp Source: Bladder (05/28 2300) BP: 120/62 (05/28 2300) Pulse Rate: 108 (05/28 2300)  Labs: Recent Labs    11/15/21 0359 11/15/21 0410 11/16/21 0414 11/16/21 0418 11/16/21 1600 11/16/21 2025 11/17/21 0436 11/17/21 1300 11/17/21 1600 11/17/21 2306  HGB 8.6*   < > 8.5* 8.1*  --   --  7.8*  --   --   --   HCT 26.5*   < > 25.0* 25.8*  --   --  24.6*  --   --   --   PLT 137*  --   --  137*  --   --  175  --   --   --   HEPARINUNFRC  --   --   --   --   --    < > 0.26* 0.26*  --  0.29*  CREATININE 4.16*   < >  --  3.53* 3.48*  --  2.87*  --  2.67*  --    < > = values in this interval not displayed.     Estimated Creatinine Clearance: 36.5 mL/min (A) (by C-G formula based on SCr of 2.67 mg/dL (H)).   Medical History: Past Medical History:  Diagnosis Date   Atrial fibrillation ([place])    Benign essential HTN 07/30/2014   Bradycardia 01/25/2015   CHF (congestive heart failure) (HCC)    Chronic anticoagulation 09/62/8366   Chronic systolic CHF (congestive heart failure), NYHA class 3 ([place]) 08/30/2014   Overview:  Global ef 30%   CKD (chronic kidney disease)    Class 3 severe obesity in adult ([place]) 04/28/2017   Coronary artery disease    Diabetes mellitus without complication (HCC)    Diabetic neuropathy ([place]) 07/28/2014   Dysrhythmia    Erectile dysfunction    High risk medication use 05/26/2018   Hypertension    Hypertensive heart disease with heart failure ([place]) 07/30/2014   Lumbago    LV dysfunction 04/28/2017   Mixed hyperlipidemia 07/28/2014   OSA (obstructive sleep apnea)    CPAP   Paroxysmal atrial fibrillation ([place]) 07/28/2014   Sinus node  dysfunction ([place]) 04/21/2016   Testicular hypofunction    Uncontrolled type 2 diabetes mellitus with microalbuminuric diabetic nephropathy 07/28/2014   Assessment: 66 yo male on chronic Xarelto for afib.  Now s/p CABG with MAZE, and on CRRT.  Remains in afib.  Pharmacy asked to resume anticoagulation with IV heparin.    Heparin level slightly below goal: 0.29 on 1600 units/hr. No issues with infusion; No overt bleeding or complications reported  Goal of Therapy:  Heparin level 0.3-0.5 Monitor platelets by anticoagulation protocol: Yes   Plan:  Increase IV heparin to 1700 units/hr. Recheck heparin level in 8 hrs Daily heparin level and CBC.  Georga Bora, PharmD Clinical Pharmacist 11/18/2021 12:11 AM Please check AMION for all [place] numbers

## 2021-11-18 NOTE — Progress Notes (Signed)
Physical Therapy Treatment Patient Details Name: [name] [name] MRN: [id number] DOB: 19-Dec-1955 Today's Date: 11/18/2021   History of Present Illness Pt is a 66 y.o. male admitted 11/12/21 for multivessel CABG with Maze procedure; extubated 5/24. Postop course complicated by oliguria and NSVT vs. Afib/RVR, shock, worsening respiratory status, suspected ileus. Pt also with worsening renal function; CRRT initiated 5/26. PMH includes CAD, CHF, CKD, DM2, HTN, afib (chronic anticoagulation), OSA.    PT Comments    Pt is making gradual progress with mobility, only requiring minA for transfers to stand and take a few steps to transfer chair > bed using the [facility] walker today. He remains limited in distance he can ambulate by CRRT lines. Pt also limited in activity tolerance and strength. Will continue to follow acutely. Current recommendations remain appropriate.     Recommendations for follow up therapy are one component of a multi-disciplinary discharge planning process, led by the attending physician.  Recommendations may be updated based on patient status, additional functional criteria and insurance authorization.  Follow Up Recommendations  Home health PT     Assistance Recommended at Discharge Intermittent Supervision/Assistance  Patient can return home with the following A little help with walking and/or transfers;A little help with bathing/dressing/bathroom;Assistance with cooking/housework;Assist for transportation;Help with stairs or ramp for entrance   Equipment Recommendations  Other (comment) (TBD)    Recommendations for Other Services       Precautions / Restrictions Precautions Precautions: Fall;Other (comment);Sternal Precaution Booklet Issued: No Precaution Comments: CRRT Restrictions Weight Bearing Restrictions: Yes Other Position/Activity Restrictions: sternal precautions     Mobility  Bed Mobility               General bed mobility comments: deferred, used bed  functions to lay back from sitting in egress position per pt request    Transfers Overall transfer level: Needs assistance Equipment used:  [place] walker) Transfers: Sit to/from Stand, Bed to chair/wheelchair/BSC Sit to Stand: Min assist, +2 safety/equipment   Step pivot transfers: +2 safety/equipment, Min assist       General transfer comment: Cues for hand placement to maintain sternal precautions. MinA to power up to stand 1x from recliner and 1x from bed. MinA to steady with stand step to R recliner > bed.    Ambulation/Gait Ambulation/Gait assistance: Min assist, +2 safety/equipment Gait Distance (Feet): 4 Feet Assistive device: Ethelene Hal Gait Pattern/deviations: Decreased stride length, Trunk flexed Gait velocity: reduced Gait velocity interpretation: <1.31 ft/sec, indicative of household ambulator   General Gait Details: Pt with slow, mildly unsteady gait stepping to R recliner > bed, pt limited by lines of CRRT. MinA for stability   Stairs             Wheelchair Mobility    Modified Rankin (Stroke Patients Only)       Balance Overall balance assessment: Needs assistance Sitting-balance support: No upper extremity supported, Feet supported Sitting balance-Leahy Scale: Fair     Standing balance support: Bilateral upper extremity supported, During functional activity, Reliant on assistive device for balance Standing balance-Leahy Scale: Poor Standing balance comment: Reliant on minA and Eva walker                            Cognition Arousal/Alertness: Awake/alert Behavior During Therapy: WFL for tasks assessed/performed Overall Cognitive Status: Within Functional Limits for tasks assessed  Exercises General Exercises - Lower Extremity Long Arc Quad: AROM, Strengthening, Both, 5 reps, Seated    General Comments        Pertinent Vitals/Pain Pain Assessment Pain Assessment:  Faces Faces Pain Scale: Hurts little more Pain Location: sternum Pain Descriptors / Indicators: Discomfort, Grimacing, Operative site guarding Pain Intervention(s): Monitored during session, Limited activity within patient's tolerance, Repositioned    Home Living                          Prior Function            PT Goals (current goals can now be found in the care plan section) Acute Rehab PT Goals Patient Stated Goal: to improve PT Goal Formulation: With patient Time For Goal Achievement: 11/30/21 Potential to Achieve Goals: Good Progress towards PT goals: Progressing toward goals    Frequency    Min 3X/week      PT Plan Current plan remains appropriate    Co-evaluation              AM-PAC PT "6 Clicks" Mobility   Outcome Measure  Help needed turning from your back to your side while in a flat bed without using bedrails?: A Lot Help needed moving from lying on your back to sitting on the side of a flat bed without using bedrails?: A Lot Help needed moving to and from a bed to a chair (including a wheelchair)?: A Little Help needed standing up from a chair using your arms (e.g., wheelchair or bedside chair)?: A Little Help needed to walk in hospital room?: Total Help needed climbing 3-5 steps with a railing? : Total 6 Click Score: 12    End of Session   Activity Tolerance: Patient tolerated treatment well Patient left: in bed;with call bell/phone within reach;with nursing/sitter in room Nurse Communication: Mobility status PT Visit Diagnosis: Other abnormalities of gait and mobility (R26.89);Muscle weakness (generalized) (M62.81);Unsteadiness on feet (R26.81);Difficulty in walking, not elsewhere classified (R26.2)     Time: 1898-4210 PT Time Calculation (min) (ACUTE ONLY): 22 min  Charges:  $Therapeutic Activity: 8-22 mins                     [name], PT, DPT Acute Rehabilitation Services  Pager: [phone] Office:  [place] 11/18/2021, 3:59 PM

## 2021-11-18 NOTE — Progress Notes (Signed)
[address]       [place] [address]             [phone]                 6 Days Post-Op Procedure(s) (LRB): CORONARY ARTERY BYPASS GRAFTING (CABG) x  3 USING LEFT INTERNAL MAMMARY ARTERY AND RIGHT GREATER SAPHENOUS VEIN (N/A) MAZE (N/A) TRANSESOPHAGEAL ECHOCARDIOGRAM (TEE) (N/A) ENDOVEIN HARVEST OF GREATER SAPHENOUS VEIN   Events: Remains in afib On CRRT Off pressors _______________________________________________________________ Vitals: BP (!) 102/57   Pulse (!) 106   Temp 97.7 F (36.5 C)   Resp (!) 32   Ht 5\' 10"  (1.778 m)   Wt 118.9 kg   SpO2 95%   BMI 37.62 kg/m  Filed Weights   11/16/21 0500 11/17/21 0400 11/18/21 0500  Weight: 133.2 kg 130.5 kg 118.9 kg     - Neuro: arousable  - Cardiovascular: afib, rate in 100s  Drips: amio 60 CVP:  [8 mmHg-23 mmHg] 8 mmHg  - Pulm: HFNC.  Coarse BS    ABG    Component Value Date/Time   PHART 7.332 (L) 11/15/2021 0410   PCO2ART 44.4 11/15/2021 0410   PO2ART 74 (L) 11/15/2021 0410   HCO3 24.2 11/16/2021 0414   TCO2 26 11/16/2021 0414   ACIDBASEDEF 3.0 (H) 11/16/2021 0414   O2SAT 63.4 11/18/2021 0408    - Abd: ND - Extremity: warm  .Intake/Output      05/28 0701 05/29 0700 05/29 0701 05/30 0700   P.O. 420 290   I.V. (mL/kg) 1933.7 (16.3) 242.3 (2)   IV Piggyback 450.2 99.9   Total Intake(mL/kg) 2803.8 (23.6) 632.1 (5.3)   Urine (mL/kg/hr) 170 (0.1)    Other 5681 1010   Stool     Total Output 5851 1010   Net -3047.2 -377.9           _______________________________________________________________ Labs:    Latest Ref Rng & Units 11/18/2021    4:08 AM 11/17/2021    4:36 AM 11/16/2021    4:18 AM  CBC  WBC 4.0 - 10.5 K/uL 12.8   12.9   14.5    Hemoglobin 13.0 - 17.0 g/dL 8.1   7.8   8.1    Hematocrit 39.0 - 52.0 % 25.9   24.6   25.8    Platelets 150 - 400 K/uL 190   175   137        Latest Ref Rng & Units 11/18/2021    4:08 AM 11/17/2021    4:00 PM 11/17/2021    4:36  AM  CMP  Glucose 70 - 99 mg/dL 165   164   167    BUN 8 - 23 mg/dL 26   28   29     Creatinine 0.61 - 1.24 mg/dL 2.57   2.67   2.87    Sodium 135 - 145 mmol/L 136   134   135    Potassium 3.5 - 5.1 mmol/L 4.4   4.3   4.0    Chloride 98 - 111 mmol/L 102   100   100    CO2 22 - 32 mmol/L 24   24   24     Calcium 8.9 - 10.3 mg/dL 8.2   8.0   7.6    Total Protein 6.5 - 8.1 g/dL 6.5    6.1    Total Bilirubin 0.3 - 1.2 mg/dL 0.8    0.8  Alkaline Phos 38 - 126 U/L 51    48    AST 15 - 41 U/L 26    36    ALT 0 - 44 U/L 9    8      CXR: -  _______________________________________________________________  Assessment and Plan: POD  6 s/p CABG MAZE  Neuro: pain controlled CV: off pressors and milr  Remains in afib.  On amio, and heparin  Pulm: continue pulm hyg Renal: on CRRT.   GI: on diet Heme: stable, starting heparin ID: Afebrile Endo: SSI Dispo: continue ICU care   Schon Zeiders O Sheila Gervasi 11/18/2021 11:13 AM

## 2021-11-18 NOTE — Progress Notes (Signed)
NAME:  [name] [name], MRN:  [id number], DOB:  [birth year], LOS: 6 ADMISSION DATE:  11/12/2021, CONSULTATION DATE:  11/13/2021 REFERRING MD:  Dr. Aundra Dubin , CHIEF COMPLAINT:  Respiratory distress    History of Present Illness:  [name] [name] is a 66 y.o. male with a PMH significant for but not limited to CAD now s/p CABG, systolic congestive heart failure, CKD, type 2 diabetes, HTN, A-fib chronically anticoagulated, and OSA who presented to the ED 5/23 for elective CABG with MAZE procedure Dr. Lawson Fiscal.  Patient was extubated per heart failure protocol with no incident by by afternoon of 5/24 PCCM was consulted for progressive increase work of breathing.   Pertinent  Medical History  CAD no s/p CABG, systolic congestive heart failure, CKD, type 2 diabetes, HTN, A-fib chronically anticoagulated, and OSA   Significant Hospital Events: Including procedures, antibiotic start and stop dates in addition to other pertinent events   5/23 underwent CABG with MAZE procedure with Dr. Lawson Fiscal 5/24 PCCM consulted for progressive increased work of breathing  5/25 Urine output remained low overnight along with soft BP which prompted addition of vaso and low dose bicarb drip  5/26 CRRT started  Interim History / Subjective:  Having a good day today. Breathing improved. No BM. Off pressors, tolerating fluid removal.  Objective   Blood pressure (!) 101/58, pulse (!) 116, temperature 98.2 F (36.8 C), resp. rate (!) 25, height 5\' 10"  (1.778 m), weight 118.9 kg, SpO2 97 %. CVP:  [8 mmHg-23 mmHg] 8 mmHg      Intake/Output Summary (Last 24 hours) at 11/18/2021 5329 Last data filed at 11/18/2021 0800 Gross per 24 hour  Intake 2932.63 ml  Output 5487 ml  Net -2554.37 ml    Filed Weights   11/16/21 0500 11/17/21 0400 11/18/21 0500  Weight: 133.2 kg 130.5 kg 118.9 kg    Examination: No distress Abdomen is protuberant but this may be baseline Heart sounds tachy Lung sounds diminished at  bases Improving anasarca Weak, moves all 4 ext  BMP looks okay CBC stable anemia and leukocytosis  Resolved Hospital Problem list     Assessment & Plan:  CAD now S/P CABG x3 -Underwent elective CABG with Dr. Lawson Fiscal 5/23 Hx of HTN/HLD  -Home medications include; Cozaar, Lopressor, and Crestor  Hx of a-fib anticoagulated at baseline- remains in afib Continue shock; cardiogenic and/or septic  P: Primary management per CTS Heparin gtt Empiric antibiotics started 5/26, will end in 2 days   Acute hypoxemic respiratory failure: related to ileus and volume overload, improved, dropped him to 4L this am Hs of OSA with use on CPAP at baseline  P: Continue supplemental oxygen with SPO2 goal > 90 CPAP qHS  Elevate HOB Mobilize as able Schedule BDs Continue mucinex and flutter valve   History of type 2 diabetes  -Home medications include Glipizide, Metformin, and Farxiga  P: Levemir and SSI, seems okay for now   Oliguric AKI superimposed on CKD stage 3a -Creatinine 1.59 with GFR 497 11/07/2021, currently creatinine 2.6 with GFR 26 by afternoon of 5/24 P: Push fluid removal with CRRT, appreciate nephrology help  Illeus- improved P: Continue suppositories Mobilization will help Advance diet, does not like clear liquids Another dose of relistor today  Best Practice (right click and "Reselect all SmartList Selections" daily)   Diet/type: cardiac diet DVT prophylaxis: heparin gtt GI prophylaxis: PPI Lines: Central line Foley:  Yes, and it is still needed Code Status:  full code Last date of multidisciplinary goals of care  discussion: Per primary   Given improvement in respiratory status, will move to a PRN basis.  Erskine Emery MD PCCM

## 2021-11-18 NOTE — Progress Notes (Signed)
OT Cancellation Note  Patient Details Name: Caedmon Louque MRN: [id number] DOB: 05/21/1956   Cancelled Treatment:    Reason Eval/Treat Not Completed: Other (comment). Just back to bed after working with PT.  [name], OTR/L Acute Rehab Services Pager [phone] Office [phone]    [name] 11/18/2021, 9:50 AM

## 2021-11-18 NOTE — Progress Notes (Signed)
ANTICOAGULATION CONSULT NOTE   Pharmacy Consult for IV heparin Indication: atrial fibrillation  No Known Allergies  Patient Measurements: Height: 5\' 10"  (177.8 cm) Weight: 118.9 kg (262 lb 3.2 oz) IBW/kg (Calculated) : 73 Heparin Dosing Weight: ~ 100 kg  Vital Signs: Temp: 98.2 F (36.8 C) (05/29 0915) Temp Source: Bladder (05/29 0700) BP: 101/58 (05/29 0915) Pulse Rate: 116 (05/29 0915)  Labs: Recent Labs    11/16/21 0418 11/16/21 1600 11/17/21 0436 11/17/21 1300 11/17/21 1600 11/17/21 2306 11/18/21 0408 11/18/21 0849  HGB 8.1*  --  7.8*  --   --   --  8.1*  --   HCT 25.8*  --  24.6*  --   --   --  25.9*  --   PLT 137*  --  175  --   --   --  190  --   HEPARINUNFRC  --    < > 0.26* 0.26*  --  0.29*  --  0.32  CREATININE 3.53*   < > 2.87*  --  2.67*  --  2.57*  --    < > = values in this interval not displayed.     Estimated Creatinine Clearance: 36.1 mL/min (A) (by C-G formula based on SCr of 2.57 mg/dL (H)).   Medical History: Past Medical History:  Diagnosis Date   Atrial fibrillation ([place])    Benign essential HTN 07/30/2014   Bradycardia 01/25/2015   CHF (congestive heart failure) (HCC)    Chronic anticoagulation 80/32/1224   Chronic systolic CHF (congestive heart failure), NYHA class 3 ([place]) 08/30/2014   Overview:  Global ef 30%   CKD (chronic kidney disease)    Class 3 severe obesity in adult ([place]) 04/28/2017   Coronary artery disease    Diabetes mellitus without complication (HCC)    Diabetic neuropathy ([place]) 07/28/2014   Dysrhythmia    Erectile dysfunction    High risk medication use 05/26/2018   Hypertension    Hypertensive heart disease with heart failure ([place]) 07/30/2014   Lumbago    LV dysfunction 04/28/2017   Mixed hyperlipidemia 07/28/2014   OSA (obstructive sleep apnea)    CPAP   Paroxysmal atrial fibrillation ([place]) 07/28/2014   Sinus node dysfunction ([place]) 04/21/2016   Testicular hypofunction    Uncontrolled type 2 diabetes  mellitus with microalbuminuric diabetic nephropathy 07/28/2014   Assessment: 66 yo male on chronic Xarelto for afib.  Now s/p CABG with MAZE, and on CRRT.  Remains in afib.  Pharmacy asked to resume anticoagulation with IV heparin.    Heparin level therapeutic at 0.32 on 1700 units/hr. units/hr. No issues with infusion; No overt bleeding or complications reported  Goal of Therapy:  Heparin level 0.3-0.5 Monitor platelets by anticoagulation protocol: Yes   Plan:  Continue IV heparin to 1700 units/hr. Recheck heparin level in 8 hrs Daily heparin level and CBC.  Cathrine Muster, PharmD PGY2 Cardiology Pharmacy Resident 11/18/2021  10:14 AM  Please check [email or web address] for unit-specific pharmacy phone numbers.

## 2021-11-18 NOTE — Progress Notes (Addendum)
Patient ID: [name] [name], male   DOB: 11/08/1955, 66 y.o.   MRN: [id number] [facility] Progress Note   Assessment/ Plan:   1.  Acute kidney injury on chronic kidney disease stage IIIa:  -AKI likely ischemic ATN in the setting of septic versus cardiogenic shock.  Started on CRRT 5/26 for extracorporeal volume unloading after he had fixed urine output on Lasix drip that was inadequate for volume unloading.   -Net ~3L, will continue CRRT at the current prescription with ultrafiltration of 100-200 cc/h to goal CVP around 6-8 cmH2O/dry weight of 280 pounds.  May be able to make him a 'no restart' tomorrow -no evidence of renal recovery at this junction 2.  Coronary artery disease status post three-vessel CABG:  -on aspirin/statin. 3. Acute on chronic dCHF -UF as tolerated with HD, CVP 13 this AM -milrinone to be stopped 5/29 4. Shock -possibly from sepsis. Off pressors. Abx per primary service 5.  Atrial fibrillation:  -Status post MAZE procedure at the time of CABG. Per primary service, on amio. 6.  Anemia:  -Likely associated with recent surgery and postoperative inflammatory response.  transfuse PRN for hgb <7  Discussed w/ primary service.  Subjective:   Without problems overnight-tolerating CRRT without issues, net neg ~3L. Uop 170cc Does report being weak w/ poor appetite.   Objective:   BP 117/65   Pulse (!) 114   Temp 98.2 F (36.8 C)   Resp (!) 30   Ht 5\' 10"  (1.778 m)   Wt 118.9 kg   SpO2 94%   BMI 37.62 kg/m   Intake/Output Summary (Last 24 hours) at 11/18/2021 0810 Last data filed at 11/18/2021 0800 Gross per 24 hour  Intake 3018.18 ml  Output 5784 ml  Net -2765.82 ml   Weight change: -11.6 kg  Physical Exam: Gen: NAD, in recliner CVS: irregular tachycardia, S1 and S2 normal, clean midline sternotomy incision Resp: Diminished breath sounds over bases, no distinct rales Abd: Soft, obese, nontender, bowel sounds normal Ext: 1+ lower  extremity/dependent edema Neuro: awake, alert Dialysis access: LIJ temp line  Imaging: DG Chest Port 1 View  Result Date: 11/18/2021 CLINICAL DATA:  ETT EXAM: PORTABLE CHEST - 1 VIEW COMPARISON:  11/16/2021 FINDINGS: Unchanged cardiomegaly. Interval increase of left perihilar airspace opacity. No significant pulmonary vascular congestion. Postsurgical changes of CABG again seen. Left IJ central venous catheter unchanged in position terminating at the confluence of the brachiocephalic veins. Right IJ sheath terminates in the region of the right brachiocephalic vein. IMPRESSION: 1. Interval worsening of left perihilar airspace opacity suspicious for developing pneumonia. 2. Unchanged cardiomegaly. Electronically Signed   By: Miachel Roux M.D.   On: 11/18/2021 07:34    Labs: BMET Recent Labs  Lab 11/15/21 0359 11/15/21 0410 11/15/21 1547 11/16/21 0414 11/16/21 0418 11/16/21 1600 11/17/21 0436 11/17/21 1600 11/18/21 0408  NA 136   < > 135 133* 132* 133* 135 134* 136  K 3.8   < > 3.6 4.0 4.0 4.5 4.0 4.3 4.4  CL 99  --  98  --  95* 99 100 100 102  CO2 24  --  18*  --  22 22 24 24 24   GLUCOSE 171*  --  171*  --  134* 230* 167* 164* 165*  BUN 37*  --  42*  --  31* 33* 29* 28* 26*  CREATININE 4.16*  --  4.61*  --  3.53* 3.48* 2.87* 2.67* 2.57*  CALCIUM 7.1*  --  6.7*  --  7.0* 7.3* 7.6* 8.0* 8.2*  PHOS  --   --  5.8*  --  4.4 5.0* 3.5 3.1 3.2   < > = values in this interval not displayed.   CBC Recent Labs  Lab 11/15/21 0359 11/15/21 0410 11/16/21 0414 11/16/21 0418 11/17/21 0436 11/18/21 0408  WBC 16.1*  --   --  14.5* 12.9* 12.8*  HGB 8.6*   < > 8.5* 8.1* 7.8* 8.1*  HCT 26.5*   < > 25.0* 25.8* 24.6* 25.9*  MCV 94.6  --   --  95.9 95.3 96.3  PLT 137*  --   --  137* 175 190   < > = values in this interval not displayed.    Medications:     aspirin EC  325 mg Oral Daily   Or   aspirin  324 mg Per Tube Daily   bisacodyl  10 mg Rectal Q0600   Chlorhexidine Gluconate Cloth   6 each Topical Daily   docusate sodium  200 mg Oral Daily   fluticasone furoate-vilanterol  1 puff Inhalation Daily   guaiFENesin  1,200 mg Oral BID   insulin aspart  0-24 Units Subcutaneous Q4H   insulin detemir  12 Units Subcutaneous Q12H   ipratropium-albuterol  3 mL Nebulization QID   lidocaine  2 patch Transdermal Q24H   methylnaltrexone  12 mg Subcutaneous Once   metoCLOPramide (REGLAN) injection  10 mg Intravenous Q6H   pantoprazole  40 mg Oral Daily   rosuvastatin  20 mg Oral Daily   sodium chloride flush  10-40 mL Intracatheter Q12H   sodium chloride flush  3 mL Intravenous Q12H   Gean Quint, MD [facility] 11/18/2021, 8:10 AM

## 2021-11-18 NOTE — Progress Notes (Signed)
At 1838 patient's CRRT machine alarmed. This RN went to check machine, CRRT showed a "flow alert" to the dialysate bag. Upon assessment the dialysate bag was dry and air in the tubing. This RN immediately clamped patient's dialysis catheter, access and return tubing and then stopped CRRT. Blood not returned and disposed of current system.

## 2021-11-19 ENCOUNTER — Inpatient Hospital Stay (HOSPITAL_COMMUNITY): Payer: Medicare HMO

## 2021-11-19 DIAGNOSIS — I5033 Acute on chronic diastolic (congestive) heart failure: Secondary | ICD-10-CM

## 2021-11-19 DIAGNOSIS — R570 Cardiogenic shock: Secondary | ICD-10-CM | POA: Diagnosis not present

## 2021-11-19 DIAGNOSIS — I480 Paroxysmal atrial fibrillation: Secondary | ICD-10-CM

## 2021-11-19 DIAGNOSIS — I2510 Atherosclerotic heart disease of native coronary artery without angina pectoris: Secondary | ICD-10-CM

## 2021-11-19 DIAGNOSIS — Z951 Presence of aortocoronary bypass graft: Secondary | ICD-10-CM | POA: Diagnosis not present

## 2021-11-19 LAB — RENAL FUNCTION PANEL
Albumin: 3.1 g/dL — ABNORMAL LOW (ref 3.5–5.0)
Albumin: 3.1 g/dL — ABNORMAL LOW (ref 3.5–5.0)
Anion gap: 10 (ref 5–15)
Anion gap: 12 (ref 5–15)
BUN: 28 mg/dL — ABNORMAL HIGH (ref 8–23)
BUN: 30 mg/dL — ABNORMAL HIGH (ref 8–23)
CO2: 23 mmol/L (ref 22–32)
CO2: 23 mmol/L (ref 22–32)
Calcium: 8.1 mg/dL — ABNORMAL LOW (ref 8.9–10.3)
Calcium: 8.1 mg/dL — ABNORMAL LOW (ref 8.9–10.3)
Chloride: 99 mmol/L (ref 98–111)
Chloride: 99 mmol/L (ref 98–111)
Creatinine, Ser: 2.27 mg/dL — ABNORMAL HIGH (ref 0.61–1.24)
Creatinine, Ser: 2.33 mg/dL — ABNORMAL HIGH (ref 0.61–1.24)
GFR, Estimated: 31 mL/min — ABNORMAL LOW (ref >60)
GFR, Estimated: 30 mL/min — ABNORMAL LOW (ref >60)
Glucose, Bld: 165 mg/dL — ABNORMAL HIGH (ref 70–99)
Glucose, Bld: 179 mg/dL — ABNORMAL HIGH (ref 70–99)
Phosphorus: 3.3 mg/dL (ref 2.5–4.6)
Phosphorus: 3.6 mg/dL (ref 2.5–4.6)
Potassium: 4 mmol/L (ref 3.5–5.1)
Potassium: 4.2 mmol/L (ref 3.5–5.1)
Sodium: 132 mmol/L — ABNORMAL LOW (ref 135–145)
Sodium: 134 mmol/L — ABNORMAL LOW (ref 135–145)

## 2021-11-19 LAB — GLUCOSE, CAPILLARY
Glucose-Capillary: 142 mg/dL — ABNORMAL HIGH (ref 70–99)
Glucose-Capillary: 163 mg/dL — ABNORMAL HIGH (ref 70–99)
Glucose-Capillary: 170 mg/dL — ABNORMAL HIGH (ref 70–99)
Glucose-Capillary: 176 mg/dL — ABNORMAL HIGH (ref 70–99)
Glucose-Capillary: 183 mg/dL — ABNORMAL HIGH (ref 70–99)
Glucose-Capillary: 220 mg/dL — ABNORMAL HIGH (ref 70–99)

## 2021-11-19 LAB — CULTURE, BLOOD (ROUTINE X 2)
Culture: NO GROWTH
Culture: NO GROWTH
Special Requests: ADEQUATE
Special Requests: ADEQUATE

## 2021-11-19 LAB — PROCALCITONIN: Procalcitonin: 0.4 ng/mL

## 2021-11-19 LAB — LACTIC ACID, PLASMA: Lactic Acid, Venous: 1.3 mmol/L (ref 0.5–1.9)

## 2021-11-19 LAB — HEPARIN LEVEL (UNFRACTIONATED): Heparin Unfractionated: 0.49 IU/mL (ref 0.30–0.70)

## 2021-11-19 LAB — COOXEMETRY PANEL
Carboxyhemoglobin: 1.1 % (ref 0.5–1.5)
Methemoglobin: <0.7 % (ref 0.0–1.5)
O2 Saturation: 61.1 %
Total hemoglobin: <6.9 g/dL — CL (ref 12.0–16.0)

## 2021-11-19 LAB — CBC
HCT: 27.9 % — ABNORMAL LOW (ref 39.0–52.0)
Hemoglobin: 8.8 g/dL — ABNORMAL LOW (ref 13.0–17.0)
MCH: 30.2 pg (ref 26.0–34.0)
MCHC: 31.5 g/dL (ref 30.0–36.0)
MCV: 95.9 fL (ref 80.0–100.0)
Platelets: 218 10*3/uL (ref 150–400)
RBC: 2.91 MIL/uL — ABNORMAL LOW (ref 4.22–5.81)
RDW: 15.2 % (ref 11.5–15.5)
WBC: 16 10*3/uL — ABNORMAL HIGH (ref 4.0–10.5)
nRBC: 0.2 % (ref 0.0–0.2)

## 2021-11-19 LAB — MAGNESIUM: Magnesium: 2.7 mg/dL — ABNORMAL HIGH (ref 1.7–2.4)

## 2021-11-19 MED ORDER — MIDODRINE HCL 5 MG PO TABS
15.0000 mg | ORAL_TABLET | Freq: Three times a day (TID) | ORAL | Status: DC
Start: 2021-11-19 — End: 2021-11-29
  Administered 2021-11-19 – 2021-11-28 (×30): 15 mg via ORAL
  Filled 2021-11-19 (×30): qty 3

## 2021-11-19 MED ORDER — LACTULOSE 10 GM/15ML PO SOLN
10.0000 g | Freq: Three times a day (TID) | ORAL | Status: DC
  Administered 2021-11-19: 10 g via ORAL
  Filled 2021-11-19 (×3): qty 15

## 2021-11-19 MED ORDER — IPRATROPIUM-ALBUTEROL 0.5-2.5 (3) MG/3ML IN SOLN
3.0000 mL | Freq: Three times a day (TID) | RESPIRATORY_TRACT | Status: DC
  Administered 2021-11-19 – 2021-11-20 (×3): 3 mL via RESPIRATORY_TRACT
  Filled 2021-11-19 (×3): qty 3

## 2021-11-19 NOTE — TOC Initial Note (Addendum)
Transition of Care [facility]) - Initial/Assessment Note    Patient Details  Name: [name] [name] MRN: [id number] Date of Birth: August 15, 1955  Transition of Care [facility]) CM/SW Contact:    Erenest Rasher, RN Phone Number: [phone] 11/19/2021, 4:24 PM  Clinical Narrative:                  HF TOC CM spoke to pt at bedside. Pt lives at home with wife. Pt repots being independent prior to hospital stay. Waiting PT/OT recommendations.   Expected Discharge Plan: [place] Barriers to Discharge: Continued Medical Work up   Patient Goals and CMS Choice Patient states their goals for this hospitalization and ongoing recovery are:: wants to be able go home CMS Medicare.gov Compare Post Acute Care list provided to:: Patient Choice offered to / list presented to : Patient  Expected Discharge Plan and Services Expected Discharge Plan: [place]   Discharge Planning Services: CM Consult Post Acute Care Choice: [place] arrangements for the past 2 months: Single Family Home                                      Prior Living Arrangements/Services Living arrangements for the past 2 months: Single Family Home Lives with:: Spouse Patient language and need for interpreter reviewed:: Yes Do you feel safe going back to the place where you live?: Yes      Need for Family Participation in Patient Care: Yes (Comment) Care giver support system in place?: Yes (comment) Current home services: DME (rollator) Criminal Activity/Legal Involvement Pertinent to Current Situation/Hospitalization: No - Comment as needed  Activities of Daily Living Home Assistive Devices/Equipment: Blood pressure cuff, CBG Meter, Eyeglasses, Cane (specify quad or straight), Dentures (specify type) (uppers and lowers) ADL Screening (condition at time of admission) Patient's cognitive ability adequate to safely complete daily activities?: Yes Is the patient deaf or have  difficulty hearing?: No Does the patient have difficulty seeing, even when wearing glasses/contacts?: No Does the patient have difficulty concentrating, remembering, or making decisions?: No Patient able to express need for assistance with ADLs?: Yes Does the patient have difficulty dressing or bathing?: No Independently performs ADLs?: Yes (appropriate for developmental age) Does the patient have difficulty walking or climbing stairs?: Yes Weakness of Legs: None Weakness of Arms/Hands: None  Permission Sought/Granted Permission sought to share information with : Case Manager, PCP, Family Supports Permission granted to share information with : Yes, Verbal Permission Granted     Permission granted to share info w AGENCY: [place] granted to share info w Relationship: Thayer Headings  Permission granted to share info w Contact Information: Charlotta Newton.  Male, 66 y.o., 01/29/1969  MRN:   [id number]  Bed:   2C14C-01  Emotional Assessment Appearance:: Appears stated age Attitude/Demeanor/Rapport: Engaged Affect (typically observed): Accepting Orientation: : Oriented to Self, Oriented to Place, Oriented to  Time, Oriented to Situation   Psych Involvement: No (comment)  Admission diagnosis:  S/P CABG x 3 [Z95.1] Patient Active Problem List   Diagnosis Date Noted   S/P CABG x 3 11/12/2021   Atherosclerotic heart disease    CHF (congestive heart failure) ([place]) 07/15/2021   Diabetes mellitus without complication ([place]) 95/02/3266   Dysrhythmia 07/15/2021   Erectile dysfunction 07/15/2021   Lumbago 07/15/2021   OSA (obstructive sleep apnea) 07/15/2021   Testicular hypofunction 07/15/2021  High risk medication use 05/26/2018   Chronic anticoagulation 02/25/2018   Class 3 severe obesity in adult [facility]) 04/28/2017   LV dysfunction 04/28/2017   Sinus node dysfunction ([place]) 04/21/2016   Bradycardia 76/81/1572   Chronic systolic CHF (congestive heart failure), NYHA class 3 ([place])  08/30/2014   Hypertensive heart disease with heart failure ([place]) 07/30/2014   Benign essential HTN 07/30/2014   Diabetic neuropathy ([place]) 07/28/2014   Mixed hyperlipidemia 07/28/2014   Paroxysmal atrial fibrillation ([place]) 07/28/2014   Uncontrolled type 2 diabetes mellitus with microalbuminuric diabetic nephropathy 07/28/2014   PCP:  Cyndi Bender, PA-C Pharmacy:   [facility] [place], [place] [address] Phone: [phone] Fax: [phone]  [place], [place] - [address] [address] [address] [place] [address] Phone: [phone] Fax: [phone]     Social Determinants of Health (SDOH) Interventions    Readmission Risk Interventions     View : No data to display.

## 2021-11-19 NOTE — Progress Notes (Signed)
Physical Therapy Treatment Patient Details Name: [name] [name] MRN: [id number] DOB: 08-26-1955 Today's Date: 11/19/2021   History of Present Illness Pt is a 66 y.o. male admitted 11/12/21 for multivessel CABG with Maze procedure; extubated 5/24. Postop course complicated by oliguria and NSVT vs. Afib/RVR, shock, worsening respiratory status, suspected ileus. Pt also with worsening renal function; CRRT initiated 5/26. PMH includes CAD, CHF, CKD, DM2, HTN, afib (chronic anticoagulation), OSA.   PT Comments    Pt progressing with mobility. RN briefly disconnected CRRT to allow for hallway ambulation; pt tolerated ~180' with eva walker and minA, continues to require increased assist for ADL tasks and maintaining sternal precautions. Pt remains limited by generalized weakness, decreased activity tolerance, and impaired balance strategies/postural reactions. Will continue to follow acutely to address established goals.    Recommendations for follow up therapy are one component of a multi-disciplinary discharge planning process, led by the attending physician.  Recommendations may be updated based on patient status, additional functional criteria and insurance authorization.  Follow Up Recommendations  Home health PT     Assistance Recommended at Discharge Intermittent Supervision/Assistance  Patient can return home with the following A little help with walking and/or transfers;A little help with bathing/dressing/bathroom;Assistance with cooking/housework;Assist for transportation;Help with stairs or ramp for entrance   Equipment Recommendations  Other (comment) (TBD)    Recommendations for Other Services       Precautions / Restrictions Precautions Precautions: Fall;Sternal Precaution Comments: CRRT (RN able to disconnect for session)     Mobility  Bed Mobility Overal bed mobility: Needs Assistance Bed Mobility: Supine to Sit       Sit to supine: Min assist, HOB elevated   General  bed mobility comments: MinA for trunk elevation, then pt able to scoot self to EOB    Transfers Overall transfer level: Needs assistance Equipment used: None (eva walker) Transfers: Sit to/from Stand Sit to Stand: Min assist, Min guard, +2 safety/equipment           General transfer comment: cues for hand placement on knees for sternal precautions and use of momentum to power up; initial stand from EOB to eva walker, minA for trunk elevation; additional sit<>stand from [facility] and recliner without DME, min guard forbalance    Ambulation/Gait Ambulation/Gait assistance: Min guard, Min assist Gait Distance (Feet): 180 Feet (+ 8') Assistive device: Ethelene Hal, None Gait Pattern/deviations: Step-through pattern, Decreased stride length, Drifts right/left, Trunk flexed Gait velocity: Decreased     General Gait Details: slow, mildly unsteady gait with eva walker, pt at times drifting towards R-side requiring minA for walker management (+2 assist lines from RN); further distance limited by c/o fatigue and pt needing to use BSC; additional 8' without DME from [facility] to recliner, minA for HHA to stabilize   Stairs             Wheelchair Mobility    Modified Rankin (Stroke Patients Only)       Balance Overall balance assessment: Needs assistance Sitting-balance support: No upper extremity supported, Feet supported Sitting balance-Leahy Scale: Fair Sitting balance - Comments: requires assist to don bilateral socks and pant legs   Standing balance support: Bilateral upper extremity supported, During functional activity, Reliant on assistive device for balance Standing balance-Leahy Scale: Poor Standing balance comment: reliant on UE support, requires assist to pull up pants while standing, assist for pericare  Cognition Arousal/Alertness: Awake/alert Behavior During Therapy: WFL for tasks assessed/performed Overall Cognitive Status: Within  Functional Limits for tasks assessed                                 General Comments: WFL for simple tasks; intermittent cues for sternal precautions        Exercises      General Comments General comments (skin integrity, edema, etc.): VSS on RA; significant fatigue with hallway ambulation. RN disconnected CRRT to allow for ambulation outside of room      Pertinent Vitals/Pain Pain Assessment Pain Assessment: Faces Faces Pain Scale: Hurts little more Pain Location: sternum Pain Descriptors / Indicators: Discomfort, Operative site guarding, Tiring, Guarding Pain Intervention(s): Monitored during session, Premedicated before session    Home Living                          Prior Function            PT Goals (current goals can now be found in the care plan section) Progress towards PT goals: Progressing toward goals    Frequency    Min 3X/week      PT Plan Current plan remains appropriate    Co-evaluation              AM-PAC PT "6 Clicks" Mobility   Outcome Measure  Help needed turning from your back to your side while in a flat bed without using bedrails?: A Little Help needed moving from lying on your back to sitting on the side of a flat bed without using bedrails?: A Lot Help needed moving to and from a bed to a chair (including a wheelchair)?: A Little Help needed standing up from a chair using your arms (e.g., wheelchair or bedside chair)?: A Little Help needed to walk in hospital room?: A Little Help needed climbing 3-5 steps with a railing? : A Lot 6 Click Score: 16    End of Session Equipment Utilized During Treatment: Gait belt Activity Tolerance: Patient tolerated treatment well Patient left: in chair;with call bell/phone within reach;with nursing/sitter in room Nurse Communication: Mobility status PT Visit Diagnosis: Other abnormalities of gait and mobility (R26.89);Muscle weakness (generalized) (M62.81);Unsteadiness  on feet (R26.81);Difficulty in walking, not elsewhere classified (R26.2)     Time: 1339-1410 PT Time Calculation (min) (ACUTE ONLY): 31 min  Charges:  $Gait Training: 8-22 mins $Therapeutic Exercise: 8-22 mins                     Mabeline Caras, PT, DPT Acute Rehabilitation Services  Pager [phone] Office [place] 11/19/2021, 3:47 PM

## 2021-11-19 NOTE — Progress Notes (Signed)
CT surgery PM rounds  Patient had a good day, down 2.5 L.  From [facility] Walked with physical therapy Old sheath removed wire removed and new triple-lumen catheter placed right subclavian vein Resting comfortably in recliner chair  \P.m. renal panel stable creatinine 2.3  Blood pressure 92/63, pulse 83, temperature (!) 97.4 F (36.3 C), temperature source Axillary, resp. rate 14, height 5\' 10"  (1.778 m), weight 116.6 kg, SpO2 95 %.

## 2021-11-19 NOTE — Progress Notes (Signed)
NAME:  [name] [name], MRN:  [id number], DOB:  1956/05/16, LOS: 7 ADMISSION DATE:  11/12/2021, CONSULTATION DATE:  11/13/2021 REFERRING MD:  Dr. Aundra Dubin , CHIEF COMPLAINT:  Respiratory distress    History of Present Illness:  [name] [name] is a 66 y.o. male with a PMH significant for but not limited to CAD now s/p CABG, systolic congestive heart failure, CKD, type 2 diabetes, HTN, A-fib chronically anticoagulated, and OSA who presented to the ED 5/23 for elective CABG with MAZE procedure Dr. Lawson Fiscal.  Patient was extubated per heart failure protocol with no incident by by afternoon of 5/24 PCCM was consulted for progressive increase work of breathing.   Pertinent  Medical History  CAD no s/p CABG, systolic congestive heart failure, CKD, type 2 diabetes, HTN, A-fib chronically anticoagulated, and OSA   Significant Hospital Events: Including procedures, antibiotic start and stop dates in addition to other pertinent events   5/23 underwent CABG with MAZE procedure with Dr. Lawson Fiscal 5/24 PCCM consulted for progressive increased work of breathing  5/25 Urine output remained low overnight along with soft BP which prompted addition of vaso and low dose bicarb drip  5/26 CRRT started  Interim History / Subjective:  Oxygen was titrated off Remains on CRRT Off Levophed, remains on vasopressin  Objective   Blood pressure 129/67, pulse (!) 107, temperature (!) 97.1 F (36.2 C), temperature source Axillary, resp. rate (!) 26, height 5\' 10"  (1.778 m), weight 116.6 kg, SpO2 91 %. CVP:  [9 mmHg-43 mmHg] 21 mmHg      Intake/Output Summary (Last 24 hours) at 11/19/2021 1043 Last data filed at 11/19/2021 1000 Gross per 24 hour  Intake 3805.89 ml  Output 6566 ml  Net -2760.11 ml   Filed Weights   11/17/21 0400 11/18/21 0500 11/19/21 0500  Weight: 130.5 kg 118.9 kg 116.6 kg    Examination: Physical exam: General: Acute on chronically ill-appearing obese male, lying on the bed HEENT: [place]/AT, eyes  anicteric.  moist mucus membranes Neuro: Alert, awake following commands, following commands Chest: Coarse breath sounds, no wheezes or rhonchi Heart: Irregularly irregular, no murmurs or gallops Abdomen: Soft, obese, nontender, nondistended, bowel sounds present Skin: No rash  Resolved Hospital Problem list     Assessment & Plan:  CAD S/P CABG x3 Underwent elective CABG with Dr. Lawson Fiscal 5/23 Continue aspirin and Crestor  Shock mixed cardiogenic and septic Acute on chronic diastolic CHF Continue antibiotics with cefepime Cultures have been negative so far Levophed. Still remains on vasopressin, continue to titrate with map goal 65 Increase midodrine to 15 mg 3 times daily [place] is at 61% EF is 55-60% Currently on CRRT for volume removal Remain off milrinone  Chronic atrial fibrillation with rapid ventricular response, status post maze procedure and LAA appendage clip Continue IV amiodarone Continue IV heparin infusion Eventually needs TEE/DCCV   Acute hypoxemic respiratory failure, resolved Patient is off oxygen Continue CPAP nightly  Constipation Lactulose added to the regimen Continue Colace   Poorly controlled diabetes type 2 Hemoglobin A1c is 9.3 Home medications include Glipizide, Metformin, and Farxiga  Continue Levemir and SSI with CBG goal 140-180   Oliguric AKI superimposed on CKD stage 3a due to ischemic ATN Continue CRRT Nephrology is following  Illeus- improved Received multiple doses Relistor Mobilization will help Advance diet as tolerated   Best Practice (right click and "Reselect all SmartList Selections" daily)   Diet/type: cardiac diet DVT prophylaxis: heparin gtt GI prophylaxis: PPI Lines: Central line Foley:  Yes, and it is  still needed Code Status:  full code Last date of multidisciplinary goals of care discussion: Per primary     Total critical care time: 33 minutes  Performed by: [place] care time was  exclusive of separately billable procedures and treating other patients.   Critical care was necessary to treat or prevent imminent or life-threatening deterioration.   Critical care was time spent personally by me on the following activities: development of treatment plan with patient and/or surrogate as well as nursing, discussions with consultants, evaluation of patient's response to treatment, examination of patient, obtaining history from patient or surrogate, ordering and performing treatments and interventions, ordering and review of laboratory studies, ordering and review of radiographic studies, pulse oximetry and re-evaluation of patient's condition.   Jacky Kindle MD [facility] See Amion for pager If no response to pager, please call [phone] until 7pm After 7pm, Please call E-link [phone]

## 2021-11-19 NOTE — Progress Notes (Addendum)
Patient ID: [name] [name], male   DOB: July 25, 1955, 66 y.o.   MRN: [id number]     Advanced Heart Failure Rounding Note  PCP-Cardiologist: None   Subjective:    Started CVVHD 5/26. Pulling 200/hr, 6.2L off yesterday (- 2.7L for the day). Little UOP. CVP 16-17.   Vasopressin and NE added back yesterday. NE now off, but still on vaso at 0.03. CO-OX stable off milrinone.   Abx started 5/25 for suspected septic shock.  Afebrile. WBC down, 21>>16K > 14K > 12.8 > 16K.  Blood Cx NGTD   Remains in AF, rate 100s on amio at 60/hr  Ileus improving. Appetite picking up, ate most of breakfast this am.   Dyspnea improved with volume removal. Able to wean off supplemental O2.    Echo: Difficult study. LV EF 55-60%, RV probably normal, no pericardial effusion.    Objective:   Weight Range: 116.6 kg Body mass index is 36.88 kg/m.   Vital Signs:   Temp:  [96.6 F (35.9 C)-99.7 F (37.6 C)] 96.6 F (35.9 C) (05/30 0700) Pulse Rate:  [78-128] 107 (05/30 0700) Resp:  [15-46] 24 (05/30 0700) BP: (75-145)/(43-83) 90/60 (05/30 0700) SpO2:  [86 %-100 %] 86 % (05/30 0700) Weight:  [116.6 kg] 116.6 kg (05/30 0500) Last BM Date : 11/16/21  Weight change: Filed Weights   11/17/21 0400 11/18/21 0500 11/19/21 0500  Weight: 130.5 kg 118.9 kg 116.6 kg    Intake/Output:   Intake/Output Summary (Last 24 hours) at 11/19/2021 0724 Last data filed at 11/19/2021 0700 Gross per 24 hour  Intake 3593.76 ml  Output 6331 ml  Net -2737.24 ml      Physical Exam   General:  No distress. Sitting up in chair. HEENT: normal Neck: supple. JVP to ear. Carotids 2+ bilat; no bruits.  Cor: PMI nondisplaced. Irregular rate & rhythm. No rubs, gallops or murmurs. Lungs: clear Abdomen: soft, nontender, + mildly distended.  Extremities: no cyanosis, clubbing, rash, 2+ edema, + TED hose Neuro: alert & orientedx3, cranial nerves grossly intact. moves all 4 extremities w/o difficulty. Affect pleasant   Telemetry    Afib 100s (personally reviewed)   Labs    CBC Recent Labs    11/18/21 0408 11/19/21 0429  WBC 12.8* 16.0*  HGB 8.1* 8.8*  HCT 25.9* 27.9*  MCV 96.3 95.9  PLT 190 496   Basic Metabolic Panel Recent Labs    11/18/21 0408 11/18/21 1628 11/19/21 0429  NA 136 137 132*  K 4.4 4.5 4.0  CL 102 103 99  CO2 24 26 23   GLUCOSE 165* 183* 165*  BUN 26* 31* 28*  CREATININE 2.57* 2.76* 2.27*  CALCIUM 8.2* 7.8* 8.1*  MG 2.8*  --  2.7*  PHOS 3.2 3.0 3.3   Liver Function Tests Recent Labs    11/17/21 0436 11/17/21 1600 11/18/21 0408 11/18/21 1628 11/19/21 0429  AST 36  --  26  --   --   ALT 8  --  9  --   --   ALKPHOS 48  --  51  --   --   BILITOT 0.8  --  0.8  --   --   PROT 6.1*  --  6.5  --   --   ALBUMIN 3.1*  3.1*   < > 2.9*  3.0* 3.0* 3.1*   < > = values in this interval not displayed.   No results for input(s): LIPASE, AMYLASE in the last 72 hours. Cardiac Enzymes No results for input(s):  CKTOTAL, CKMB, CKMBINDEX, TROPONINI in the last 72 hours.  BNP: BNP (last 3 results) No results for input(s): BNP in the last 8760 hours.  ProBNP (last 3 results) No results for input(s): PROBNP in the last 8760 hours.   D-Dimer No results for input(s): DDIMER in the last 72 hours. Hemoglobin A1C No results for input(s): HGBA1C in the last 72 hours. Fasting Lipid Panel No results for input(s): CHOL, HDL, LDLCALC, TRIG, CHOLHDL, LDLDIRECT in the last 72 hours. Thyroid Function Tests No results for input(s): TSH, T4TOTAL, T3FREE, THYROIDAB in the last 72 hours.  Invalid input(s): FREET3  Other results:   Imaging    No results found.   Medications:     Scheduled Medications:  (feeding supplement) PROSource Plus  30 mL Oral TID BM   aspirin EC  325 mg Oral Daily   Or   aspirin  324 mg Per Tube Daily   bisacodyl  10 mg Rectal Q0600   Chlorhexidine Gluconate Cloth  6 each Topical Daily   docusate sodium  200 mg Oral Daily   fluticasone  furoate-vilanterol  1 puff Inhalation Daily   guaiFENesin  1,200 mg Oral BID   insulin aspart  0-24 Units Subcutaneous Q4H   insulin detemir  12 Units Subcutaneous Q12H   ipratropium-albuterol  3 mL Nebulization QID   lidocaine  2 patch Transdermal Q24H   metoCLOPramide (REGLAN) injection  10 mg Intravenous Q6H   midodrine  10 mg Oral TID WC   multivitamin  1 tablet Oral QHS   pantoprazole  40 mg Oral Daily   rosuvastatin  20 mg Oral Daily   sodium chloride flush  10-40 mL Intracatheter Q12H   sodium chloride flush  3 mL Intravenous Q12H    Infusions:   prismasol BGK 4/2.5 400 mL/hr at 11/18/21 2000    prismasol BGK 4/2.5 400 mL/hr at 11/18/21 2000   sodium chloride Stopped (11/14/21 0549)   sodium chloride     sodium chloride 20 mL/hr at 11/12/21 1509   sodium chloride     amiodarone 60 mg/hr (11/19/21 0700)   ceFEPime (MAXIPIME) IV Stopped (11/18/21 2204)   heparin 1,700 Units/hr (11/19/21 0700)   lactated ringers Stopped (11/18/21 2130)   lactated ringers Stopped (11/17/21 2054)   norepinephrine (LEVOPHED) Adult infusion Stopped (11/19/21 3299)   prismasol BGK 4/2.5 1,500 mL/hr at 11/19/21 0229   vasopressin 0.03 Units/min (11/19/21 0700)    PRN Medications: sodium chloride, heparin, HYDROmorphone (DILAUDID) injection, ondansetron (ZOFRAN) IV, oxyCODONE, sodium chloride, sodium chloride flush, sodium chloride flush    Patient Profile   66 y.o. male with history of CM with recovered EF, persistent atrial fibrillation, DM II, CKD, sick sinus syndrome, OSA, prior smoker. Underwent CABG X 3 and MAZE/PVI on 05/23>>post-op decrease in UOP and AKI with volume overload>>postcardiotomy shock requiring inotrope and pressor support  Assessment/Plan   1. Shock: Echo reviewed, LV EF 55-60%, RV poorly visualized but probably near-normal, no significant pericardial effusion. Suspect sepsis physiology with distributive shock.  Improved with broad spectrum abx. Lactate has cleared. Bcx  5/25 remain NGTD.  - NE and vaso added back 05/29, NE now off. Wean vaso as able. Milrinone discontinued 05/29. CO-OX stable at 61% today. - Increase midodrine to 15 mg TID to assist wean off vasopressin - Vancomycin now off, completing course of cefepime today 2. Acute on chronic primarily diastolic CHF: LV EF 24-26%, RV poorly visualized but probably near-normal, no significant pericardial effusion.  Patient is still volume overloaded, suspect primarily  right heart failure physiology.  CVVHD started on 5/26. Pulling 200/hr, 6L off yesterday. Weight down 5 lb overnight. CVP 16-17. Continue volume removal today. - Milrinone now off as above. - Continue TEDs 3. AKI on CKD stage 3: Baseline SCr 1.3-1.5 .  Suspect due to shock/ATN as well as elevated renal venous pressure in setting of volume overload, ileus, abdominal distention. CVVHD started 5/26. Remains nearly anuric (u/o 100 cc last 24 hr) - Renal following - Continues on CVVHD 4. CAD: S/p CABG. No s/s ischemia - Continue ASA, statin.  5. Atrial fibrillation with RVR: Has had Maze and LA appendage clip at time of CABG on 11/12/21. He remains in AF with RVR. - Off milrinone now - Continue heparin gtt - Continue amiodarone 60 mg/hr.  - Will need eventual TEE-DCCV.  6. DM2: SSI.  7. ID: WBCs 22>>16K > 14K -> 12.8 -> 16K Septic shock. Lactate cleared - Continue cefepime. Off vancomycin as above - Blood culture NGTD 8. Ileus: NGT.  Improving, appetite picking up - Continue Reglan.  - Received Relastor 5/27  - May need Cortrack.  9. Pulmonary: Respiratory status improving with fluid removal and abx - CVVH for volume removal  - CCM following.   10. DM2 - on SSI   Length of Stay: Stark, LINDSAY N, PA-C  11/19/2021, 7:24 AM  Advanced Heart Failure Team Pager [phone] (M-F; 7a - 5p)  Please contact [facility] for night-coverage after hours (5p -7a ) and weekends on [email or web address]  Patient seen with PA, agree with the above note.    Ongoing CVVH, pulling up to 200 cc/hr net negative.  CVP still 15-16 range.  Weight coming down.   He remains on vasopressin 0.03, off NE.  Midodrine at 10 mg tid.   He remains in atrial fibrillation on amiodarone and heparin gtts.  Cefepime for empiric PNA treatment, afebrile.  General: NAD Neck: JVP 16 cm, no thyromegaly or thyroid nodule.  Lungs: Clear to auscultation bilaterally with normal respiratory effort. CV: Nondisplaced PMI.  Heart regular S1/S2, no S3/S4, no murmur.  Trace ankle edema. Abdomen: Soft, nontender, no hepatosplenomegaly, no distention.  Skin: Intact without lesions or rashes.  Neurologic: Alert and oriented x 3.  Psych: Normal affect. Extremities: No clubbing or cyanosis.  HEENT: Normal.   Still with volume overload/diastolic CHF, continue CVVH today at current rate.    Wean vasopressin as able, suspect distributive shock.  Increase midodrine to 15 mg tid to facilitate wean.   After volume status optimized, will need TEE-guided DCCV to get him back into NSR.  Continue amiodarone/heparin for now.   Continue cefepime for PNA for 1 more day.   CRITICAL CARE Performed by: Loralie Champagne  Total critical care time: 35 minutes  Critical care time was exclusive of separately billable procedures and treating other patients.  Critical care was necessary to treat or prevent imminent or life-threatening deterioration.  Critical care was time spent personally by me on the following activities: development of treatment plan with patient and/or surrogate as well as nursing, discussions with consultants, evaluation of patient's response to treatment, examination of patient, obtaining history from patient or surrogate, ordering and performing treatments and interventions, ordering and review of laboratory studies, ordering and review of radiographic studies, pulse oximetry and re-evaluation of patient's condition.  Loralie Champagne 11/19/2021 8:06 AM

## 2021-11-19 NOTE — CV Procedure (Signed)
R central line  (TLC) Placement  Informed consent done Sterile prep and Drape 5 cc 1% lidocaine Seldinger Technique- wire placed then triple lumen catheter placed without resistance, good return  Sterile dressing with Biopatch placed  PCXR ordered for placement  P Prescott Gum MD

## 2021-11-19 NOTE — Progress Notes (Signed)
Initial Nutrition Assessment  DOCUMENTATION CODES:   Not applicable  INTERVENTION:   Renal MVI daily  Double protein/entree portions at meals; reinforced importance of protein at all meals; encouraged pt to eat the protein first.   If po intake inadequate on follow-up, recommend liberalizing diet to Regular with fluid restriction   NUTRITION DIAGNOSIS:   Increased nutrient needs related to acute illness as evidenced by estimated needs.  GOAL:   Patient will meet greater than or equal to 90% of their needs   MONITOR:   PO intake, Labs, Weight trends  REASON FOR ASSESSMENT:   Rounds (CRRT, poor po intake)    ASSESSMENT:   66 yo male admitted for elective CABG x 3; developed respiratory distress, AKI on CKD requiring CRRT, post op ileus. PMH includes CAD, CHF, CKD, DM, HTN, OSA  5/23 CABG x 3 with MAZE 5/26 CRRT initiated  Remains on CRRT-UF 100-200 ml/hr; off levophed, remains on vasopressin (down to 0.01)  +post-op ileus which is improving; +medium BM today  PO intake has started to improve with advancement of diet. Pt ate 1/2 eggs and all of the french toast this AM. Working on lunch on visit today. No recorded po intake.   NPO/CL from 5/23 to 5/29 when diet advanced to FL then Heart Healthy at breakfast. Current diet order: 2g sodium with fluid restriction  Pt does not like Ensure Enlive, [employer], Pro-Source supplement.   Current wt 116.6 kg, post CABG weight of 134.1 kg, admit weight 127.1 kg; dry weight unknown.  Labs: sodium 132 (L), CBGs 142-220 Meds: reglan, lactulose, ss novolog, levemir, colace, dulcolax   NUTRITION - FOCUSED PHYSICAL EXAM:  Flowsheet Row Most Recent Value  Orbital Region No depletion  Upper Arm Region No depletion  Thoracic and Lumbar Region No depletion  Buccal Region No depletion  Temple Region No depletion  Clavicle Bone Region No depletion  Clavicle and Acromion Bone Region No depletion  Scapular Bone Region No  depletion  Dorsal Hand No depletion  Patellar Region No depletion  Anterior Thigh Region No depletion  Posterior Calf Region No depletion  Edema (RD Assessment) Mild       Diet Order:   Diet Order             Diet 2 gram sodium Room service appropriate? Yes; Fluid consistency: Thin; Fluid restriction: 1800 mL Fluid  Diet effective now                   EDUCATION NEEDS:   Education needs have been addressed  Skin:  Skin Assessment: Reviewed RN Assessment  Last BM:  5/30  Height:   Ht Readings from Last 1 Encounters:  11/12/21 5\' 10"  (1.778 m)    Weight:   Wt Readings from Last 1 Encounters:  11/19/21 116.6 kg     BMI:  Body mass index is 36.88 kg/m.  Estimated Nutritional Needs:   Kcal:  2100-2300 kcals  Protein:  115-135 g  Fluid:  1.8 L    Kerman Passey MS, RDN, LDN, CNSC Registered Dietitian III Clinical Nutrition RD Pager and On-Call Pager Number Located in [place]

## 2021-11-19 NOTE — Progress Notes (Signed)
Patient refused CPAP for the night  

## 2021-11-19 NOTE — Progress Notes (Signed)
[facility] DAILY ICU PROGRESS NOTE                   [address]            [place] [address]          [phone]   7 Days Post-Op Procedure(s) (LRB): CORONARY ARTERY BYPASS GRAFTING (CABG) x  3 USING LEFT INTERNAL MAMMARY ARTERY AND RIGHT GREATER SAPHENOUS VEIN (N/A) MAZE (N/A) TRANSESOPHAGEAL ECHOCARDIOGRAM (TEE) (N/A) ENDOVEIN HARVEST OF GREATER SAPHENOUS VEIN  Total Length of Stay:  LOS: 7 days   Subjective: Patient sitting in chair.on CVVH. He denies nausea. He fells as though he will have bowel movement today.  Objective: Vital signs in last 24 hours: Temp:  [97 F (36.1 C)-99.7 F (37.6 C)] 97.2 F (36.2 C) (05/30 0645) Pulse Rate:  [78-128] 105 (05/30 0645) Cardiac Rhythm: Atrial fibrillation (05/30 0400) Resp:  [15-46] 22 (05/30 0645) BP: (75-145)/(43-83) 104/72 (05/30 0645) SpO2:  [90 %-100 %] 94 % (05/30 0645) Weight:  [116.6 kg] 116.6 kg (05/30 0500)  Filed Weights   11/17/21 0400 11/18/21 0500 11/19/21 0500  Weight: 130.5 kg 118.9 kg 116.6 kg    Weight change: -2.333 kg   Hemodynamic parameters for last 24 hours: CVP:  [7 mmHg-43 mmHg] 14 mmHg  Intake/Output from previous day: 05/29 0701 - 05/30 0700 In: 3533.4 [P.O.:1870; I.V.:1463.5; IV Piggyback:199.9] Out: 0160 [Urine:100]  Intake/Output this shift: No intake/output data recorded.  Current Meds: Scheduled Meds:  (feeding supplement) PROSource Plus  30 mL Oral TID BM   aspirin EC  325 mg Oral Daily   Or   aspirin  324 mg Per Tube Daily   bisacodyl  10 mg Rectal Q0600   Chlorhexidine Gluconate Cloth  6 each Topical Daily   docusate sodium  200 mg Oral Daily   fluticasone furoate-vilanterol  1 puff Inhalation Daily   guaiFENesin  1,200 mg Oral BID   insulin aspart  0-24 Units Subcutaneous Q4H   insulin detemir  12 Units Subcutaneous Q12H   ipratropium-albuterol  3 mL Nebulization QID   lidocaine  2 patch Transdermal Q24H   metoCLOPramide (REGLAN) injection  10 mg Intravenous  Q6H   midodrine  10 mg Oral TID WC   multivitamin  1 tablet Oral QHS   pantoprazole  40 mg Oral Daily   rosuvastatin  20 mg Oral Daily   sodium chloride flush  10-40 mL Intracatheter Q12H   sodium chloride flush  3 mL Intravenous Q12H   Continuous Infusions:   prismasol BGK 4/2.5 400 mL/hr at 11/18/21 2000    prismasol BGK 4/2.5 400 mL/hr at 11/18/21 2000   sodium chloride Stopped (11/14/21 0549)   sodium chloride     sodium chloride 20 mL/hr at 11/12/21 1509   sodium chloride     amiodarone 60 mg/hr (11/19/21 0635)   ceFEPime (MAXIPIME) IV Stopped (11/18/21 2204)   heparin 1,700 Units/hr (11/19/21 1093)   lactated ringers Stopped (11/18/21 2130)   lactated ringers Stopped (11/17/21 2054)   norepinephrine (LEVOPHED) Adult infusion 2 mcg/min (11/19/21 0600)   prismasol BGK 4/2.5 1,500 mL/hr at 11/19/21 0229   vasopressin 0.03 Units/min (11/19/21 0600)   PRN Meds:.sodium chloride, heparin, HYDROmorphone (DILAUDID) injection, ondansetron (ZOFRAN) IV, oxyCODONE, sodium chloride, sodium chloride flush, sodium chloride flush  General appearance: alert, cooperative, and no distress Neurologic: intact Heart: IRRR IRRR Lungs: Slightly diminished bibasilar breath sounds Abdomen: Semi soft, obese, occasional bowel sounds Extremities:Ted hose in place Wound: Sternal wound  is clean and dry  Lab Results: CBC: Recent Labs    11/18/21 0408 11/19/21 0429  WBC 12.8* 16.0*  HGB 8.1* 8.8*  HCT 25.9* 27.9*  PLT 190 218    BMET:  Recent Labs    11/18/21 1628 11/19/21 0429  NA 137 132*  K 4.5 4.0  CL 103 99  CO2 26 23  GLUCOSE 183* 165*  BUN 31* 28*  CREATININE 2.76* 2.27*  CALCIUM 7.8* 8.1*     CMET: Lab Results  Component Value Date   WBC 16.0 (H) 11/19/2021   HGB 8.8 (L) 11/19/2021   HCT 27.9 (L) 11/19/2021   PLT 218 11/19/2021   GLUCOSE 165 (H) 11/19/2021   ALT 9 11/18/2021   AST 26 11/18/2021   NA 132 (L) 11/19/2021   K 4.0 11/19/2021   CL 99 11/19/2021    CREATININE 2.27 (H) 11/19/2021   BUN 28 (H) 11/19/2021   CO2 23 11/19/2021   INR 1.4 (H) 11/12/2021   HGBA1C 9.3 (H) 11/08/2021      PT/INR:  No results for input(s): LABPROT, INR in the last 72 hours.  Radiology: No results found.   Assessment/Plan: S/P Procedure(s) (LRB): CORONARY ARTERY BYPASS GRAFTING (CABG) x  3 USING LEFT INTERNAL MAMMARY ARTERY AND RIGHT GREATER SAPHENOUS VEIN (N/A) MAZE (N/A) TRANSESOPHAGEAL ECHOCARDIOGRAM (TEE) (N/A) ENDOVEIN HARVEST OF GREATER SAPHENOUS VEIN CV-History of persistent atrial fibrillation. A flutter/a fib post op.  On Amiodarone,Vasopressin, and heparin drips. Also, on Midodrine 10 mg tid. Co ox this am decreased to 61.1. Hope to wean off Vaso Pulmonary-History of OSA on CPAP. On HFNC 2 liters. CXR this am appears stable. Continue Breo Ellipta and Xopenex. Encourage incentive spirometer and flutter valve. 3. Acute on chronic CHF- Appreciate advanced heart failure's assistance 4. Expected post op blood loss anemia-H and H this am stable at 8.8 and 27.9 5. DM-CBGs 182/184/163. On Insulin. Pre op HGA1C 9.3. Will not restart Metformin or Glipizide yet as creatinine elevated. Will need close medical follow up after discharge 6. AKI on CKD (stage III)-Creatinine decreased to 2.27. Nephrology following and arranging for CVVH 7. Leukocytosis, afebrile-WBC increased to 16,000.Lactic acid decreased to 1.3. On Cefepime. Blood cultures show NGTD 8. GI-post op ileus. On Reglan. Tolerating liquids   Symphani Eckstrom [place] PA-C 11/19/2021 7:01 AM

## 2021-11-19 NOTE — Progress Notes (Signed)
[place] for IV heparin Indication: atrial fibrillation  No Known Allergies  Patient Measurements: Height: 5\' 10"  (177.8 cm) Weight: 116.6 kg (257 lb 0.9 oz) IBW/kg (Calculated) : 73 Heparin Dosing Weight: ~ 100 kg  Vital Signs: Temp: 96.6 F (35.9 C) (05/30 0700) Temp Source: Bladder (05/30 0400) BP: 90/60 (05/30 0700) Pulse Rate: 107 (05/30 0700)  Labs: Recent Labs    11/17/21 0436 11/17/21 1300 11/18/21 0408 11/18/21 0849 11/18/21 1628 11/19/21 0429  HGB 7.8*  --  8.1*  --   --  8.8*  HCT 24.6*  --  25.9*  --   --  27.9*  PLT 175  --  190  --   --  218  HEPARINUNFRC 0.26*   < >  --  0.32 0.43 0.49  CREATININE 2.87*   < > 2.57*  --  2.76* 2.27*   < > = values in this interval not displayed.     Estimated Creatinine Clearance: 40.4 mL/min (A) (by C-G formula based on SCr of 2.27 mg/dL (H)).   Medical History: Past Medical History:  Diagnosis Date   Atrial fibrillation ([place])    Benign essential HTN 07/30/2014   Bradycardia 01/25/2015   CHF (congestive heart failure) (HCC)    Chronic anticoagulation 37/62/8315   Chronic systolic CHF (congestive heart failure), NYHA class 3 ([place]) 08/30/2014   Overview:  Global ef 30%   CKD (chronic kidney disease)    Class 3 severe obesity in adult ([place]) 04/28/2017   Coronary artery disease    Diabetes mellitus without complication (HCC)    Diabetic neuropathy ([place]) 07/28/2014   Dysrhythmia    Erectile dysfunction    High risk medication use 05/26/2018   Hypertension    Hypertensive heart disease with heart failure ([place]) 07/30/2014   Lumbago    LV dysfunction 04/28/2017   Mixed hyperlipidemia 07/28/2014   OSA (obstructive sleep apnea)    CPAP   Paroxysmal atrial fibrillation ([place]) 07/28/2014   Sinus node dysfunction ([place]) 04/21/2016   Testicular hypofunction    Uncontrolled type 2 diabetes mellitus with microalbuminuric diabetic nephropathy 07/28/2014   Assessment: 66 yo male  on chronic Xarelto for afib.  Now s/p CABG with MAZE, and on CRRT.  Remains in afib.  Pharmacy asked to resume anticoagulation with IV heparin.    Heparin level remains therapeutic, H/H and pltc stable.  Goal of Therapy:  Heparin level 0.3-0.5 Monitor platelets by anticoagulation protocol: Yes   Plan:  Continue heparin 1700 units/hr Daily heparin level and CBC  Arrie Senate, PharmD, [place], [facility] Clinical Pharmacist [phone] Please check AMION for all [facility] numbers 11/19/2021

## 2021-11-19 NOTE — Progress Notes (Signed)
Patient ID: [name] [name], male   DOB: 12-13-1955, 66 y.o.   MRN: [id number] [facility] Progress Note   Assessment/ Plan:   1.  Acute kidney injury on chronic kidney disease stage IIIa:  -AKI likely ischemic ATN in the setting of septic versus cardiogenic shock.  Started on CRRT 5/26 for extracorporeal volume unloading after he had fixed urine output on Lasix drip that was inadequate for volume unloading.   -will continue CRRT at the current prescription with ultrafiltration of 100-200 cc/h to goal CVP around 6-8 cmH2O/dry weight of 280 pounds. Hoping that his midodrine will take effect and he can wean off pressors that way we can get him transitioned to HD -no evidence of renal recovery at this junction, will need his HD catheter converted to [facility] down the road  2.  Coronary artery disease status post three-vessel CABG:  -on aspirin/statin. 3. Acute on chronic dCHF -UF as tolerated with HD, CVP still elevated per RN -milrinone stopped 5/29 4. Shock -possibly from sepsis. Abx per primary service. Cxr suspicious for developing PNA 5.  Atrial fibrillation:  -Status post MAZE procedure at the time of CABG. Per primary service, on amio. 6.  Anemia:  -Likely associated with recent surgery and postoperative inflammatory response.  transfuse PRN for hgb <7   Subjective:   Patient seen and examined on CRRT. Tolerating treatment, no acute events. Remains on vaso. Started on midodrine yesterday. Had a brief requirement with levo. Net neg ~2.7L No complaints this AM.   Objective:   BP 90/60   Pulse (!) 107   Temp (!) 96.6 F (35.9 C)   Resp (!) 24   Ht 5\' 10"  (1.778 m)   Wt 116.6 kg   SpO2 (!) 86%   BMI 36.88 kg/m   Intake/Output Summary (Last 24 hours) at 11/19/2021 0736 Last data filed at 11/19/2021 0700 Gross per 24 hour  Intake 3593.76 ml  Output 6331 ml  Net -2737.24 ml   Weight change: -2.333 kg  Physical Exam: Gen: NAD, in recliner CVS: irregular tachycardia,  S1 and S2 normal, clean midline sternotomy incision Resp: Diminished breath sounds over bases, no distinct rales Abd: Soft, obese, nontender, bowel sounds normal Ext: trace lower extremity/dependent edema Neuro: awake, alert Dialysis access: LIJ temp line  Imaging: DG Chest Port 1 View  Result Date: 11/18/2021 CLINICAL DATA:  ETT EXAM: PORTABLE CHEST - 1 VIEW COMPARISON:  11/16/2021 FINDINGS: Unchanged cardiomegaly. Interval increase of left perihilar airspace opacity. No significant pulmonary vascular congestion. Postsurgical changes of CABG again seen. Left IJ central venous catheter unchanged in position terminating at the confluence of the brachiocephalic veins. Right IJ sheath terminates in the region of the right brachiocephalic vein. IMPRESSION: 1. Interval worsening of left perihilar airspace opacity suspicious for developing pneumonia. 2. Unchanged cardiomegaly. Electronically Signed   By: Miachel Roux M.D.   On: 11/18/2021 07:34    Labs: BMET Recent Labs  Lab 11/16/21 0418 11/16/21 1600 11/17/21 0436 11/17/21 1600 11/18/21 0408 11/18/21 1628 11/19/21 0429  NA 132* 133* 135 134* 136 137 132*  K 4.0 4.5 4.0 4.3 4.4 4.5 4.0  CL 95* 99 100 100 102 103 99  CO2 22 22 24 24 24 26 23   GLUCOSE 134* 230* 167* 164* 165* 183* 165*  BUN 31* 33* 29* 28* 26* 31* 28*  CREATININE 3.53* 3.48* 2.87* 2.67* 2.57* 2.76* 2.27*  CALCIUM 7.0* 7.3* 7.6* 8.0* 8.2* 7.8* 8.1*  PHOS 4.4 5.0* 3.5 3.1 3.2 3.0 3.3  CBC Recent Labs  Lab 11/16/21 0418 11/17/21 0436 11/18/21 0408 11/19/21 0429  WBC 14.5* 12.9* 12.8* 16.0*  HGB 8.1* 7.8* 8.1* 8.8*  HCT 25.8* 24.6* 25.9* 27.9*  MCV 95.9 95.3 96.3 95.9  PLT 137* 175 190 218    Medications:     (feeding supplement) PROSource Plus  30 mL Oral TID BM   aspirin EC  325 mg Oral Daily   Or   aspirin  324 mg Per Tube Daily   bisacodyl  10 mg Rectal Q0600   Chlorhexidine Gluconate Cloth  6 each Topical Daily   docusate sodium  200 mg Oral Daily    fluticasone furoate-vilanterol  1 puff Inhalation Daily   guaiFENesin  1,200 mg Oral BID   insulin aspart  0-24 Units Subcutaneous Q4H   insulin detemir  12 Units Subcutaneous Q12H   ipratropium-albuterol  3 mL Nebulization QID   lidocaine  2 patch Transdermal Q24H   metoCLOPramide (REGLAN) injection  10 mg Intravenous Q6H   midodrine  10 mg Oral TID WC   multivitamin  1 tablet Oral QHS   pantoprazole  40 mg Oral Daily   rosuvastatin  20 mg Oral Daily   sodium chloride flush  10-40 mL Intracatheter Q12H   sodium chloride flush  3 mL Intravenous Q12H   Gean Quint, MD [facility] 11/19/2021, 7:36 AM

## 2021-11-20 ENCOUNTER — Inpatient Hospital Stay (HOSPITAL_COMMUNITY): Payer: Medicare HMO

## 2021-11-20 DIAGNOSIS — I480 Paroxysmal atrial fibrillation: Secondary | ICD-10-CM | POA: Diagnosis not present

## 2021-11-20 DIAGNOSIS — Z951 Presence of aortocoronary bypass graft: Secondary | ICD-10-CM | POA: Diagnosis not present

## 2021-11-20 DIAGNOSIS — R579 Shock, unspecified: Secondary | ICD-10-CM | POA: Diagnosis not present

## 2021-11-20 DIAGNOSIS — I5033 Acute on chronic diastolic (congestive) heart failure: Secondary | ICD-10-CM | POA: Diagnosis not present

## 2021-11-20 DIAGNOSIS — N179 Acute kidney failure, unspecified: Secondary | ICD-10-CM | POA: Diagnosis not present

## 2021-11-20 DIAGNOSIS — I2510 Atherosclerotic heart disease of native coronary artery without angina pectoris: Secondary | ICD-10-CM | POA: Diagnosis not present

## 2021-11-20 LAB — CBC
HCT: 29.3 % — ABNORMAL LOW (ref 39.0–52.0)
Hemoglobin: 9.1 g/dL — ABNORMAL LOW (ref 13.0–17.0)
MCH: 29.8 pg (ref 26.0–34.0)
MCHC: 31.1 g/dL (ref 30.0–36.0)
MCV: 96.1 fL (ref 80.0–100.0)
Platelets: 246 10*3/uL (ref 150–400)
RBC: 3.05 MIL/uL — ABNORMAL LOW (ref 4.22–5.81)
RDW: 15.3 % (ref 11.5–15.5)
WBC: 17.1 10*3/uL — ABNORMAL HIGH (ref 4.0–10.5)
nRBC: 0.2 % (ref 0.0–0.2)

## 2021-11-20 LAB — RENAL FUNCTION PANEL
Albumin: 3.2 g/dL — ABNORMAL LOW (ref 3.5–5.0)
Albumin: 3.3 g/dL — ABNORMAL LOW (ref 3.5–5.0)
Anion gap: 13 (ref 5–15)
Anion gap: 11 (ref 5–15)
BUN: 28 mg/dL — ABNORMAL HIGH (ref 8–23)
BUN: 37 mg/dL — ABNORMAL HIGH (ref 8–23)
CO2: 22 mmol/L (ref 22–32)
CO2: 22 mmol/L (ref 22–32)
Calcium: 8.4 mg/dL — ABNORMAL LOW (ref 8.9–10.3)
Calcium: 8.4 mg/dL — ABNORMAL LOW (ref 8.9–10.3)
Chloride: 100 mmol/L (ref 98–111)
Chloride: 101 mmol/L (ref 98–111)
Creatinine, Ser: 2.32 mg/dL — ABNORMAL HIGH (ref 0.61–1.24)
Creatinine, Ser: 3.21 mg/dL — ABNORMAL HIGH (ref 0.61–1.24)
GFR, Estimated: 30 mL/min — ABNORMAL LOW (ref >60)
GFR, Estimated: 20 mL/min — ABNORMAL LOW (ref >60)
Glucose, Bld: 131 mg/dL — ABNORMAL HIGH (ref 70–99)
Glucose, Bld: 220 mg/dL — ABNORMAL HIGH (ref 70–99)
Phosphorus: 3.4 mg/dL (ref 2.5–4.6)
Phosphorus: 4.1 mg/dL (ref 2.5–4.6)
Potassium: 4.1 mmol/L (ref 3.5–5.1)
Potassium: 4.3 mmol/L (ref 3.5–5.1)
Sodium: 135 mmol/L (ref 135–145)
Sodium: 134 mmol/L — ABNORMAL LOW (ref 135–145)

## 2021-11-20 LAB — EXPECTORATED SPUTUM ASSESSMENT W GRAM STAIN, RFLX TO RESP C

## 2021-11-20 LAB — COOXEMETRY PANEL
Carboxyhemoglobin: 0.6 % (ref 0.5–1.5)
Carboxyhemoglobin: 0.8 % (ref 0.5–1.5)
Methemoglobin: <0.7 % (ref 0.0–1.5)
Methemoglobin: <0.7 % (ref 0.0–1.5)
O2 Saturation: 50.4 %
O2 Saturation: 48.5 %
Total hemoglobin: 8.5 g/dL — ABNORMAL LOW (ref 12.0–16.0)
Total hemoglobin: 11 g/dL — ABNORMAL LOW (ref 12.0–16.0)

## 2021-11-20 LAB — GLUCOSE, CAPILLARY
Glucose-Capillary: 134 mg/dL — ABNORMAL HIGH (ref 70–99)
Glucose-Capillary: 150 mg/dL — ABNORMAL HIGH (ref 70–99)
Glucose-Capillary: 244 mg/dL — ABNORMAL HIGH (ref 70–99)
Glucose-Capillary: 201 mg/dL — ABNORMAL HIGH (ref 70–99)
Glucose-Capillary: 213 mg/dL — ABNORMAL HIGH (ref 70–99)
Glucose-Capillary: 207 mg/dL — ABNORMAL HIGH (ref 70–99)

## 2021-11-20 LAB — HEPARIN LEVEL (UNFRACTIONATED): Heparin Unfractionated: 0.36 IU/mL (ref 0.30–0.70)

## 2021-11-20 LAB — HEPATITIS B SURFACE ANTIBODY,QUALITATIVE: Hep B S Ab: NONREACTIVE

## 2021-11-20 LAB — MAGNESIUM: Magnesium: 2.7 mg/dL — ABNORMAL HIGH (ref 1.7–2.4)

## 2021-11-20 LAB — HEPATITIS B SURFACE ANTIGEN: Hepatitis B Surface Ag: NONREACTIVE

## 2021-11-20 MED ORDER — ALBUMIN HUMAN 25 % IV SOLN
12.5000 g | Freq: Four times a day (QID) | INTRAVENOUS | Status: DC
  Administered 2021-11-20 (×2): 12.5 g via INTRAVENOUS
  Filled 2021-11-20 (×2): qty 50

## 2021-11-20 MED ORDER — CHLORHEXIDINE GLUCONATE CLOTH 2 % EX PADS
6.0000 | MEDICATED_PAD | Freq: Every day | CUTANEOUS | Status: DC
  Administered 2021-11-22 – 2021-11-23 (×2): 6 via TOPICAL

## 2021-11-20 MED ORDER — MELATONIN 3 MG PO TABS
3.0000 mg | ORAL_TABLET | Freq: Once | ORAL | Status: AC
  Administered 2021-11-20: 3 mg via ORAL
  Filled 2021-11-20: qty 1

## 2021-11-20 MED ORDER — IPRATROPIUM-ALBUTEROL 0.5-2.5 (3) MG/3ML IN SOLN
3.0000 mL | Freq: Four times a day (QID) | RESPIRATORY_TRACT | Status: DC | PRN
  Administered 2021-11-21: 3 mL via RESPIRATORY_TRACT
  Filled 2021-11-20: qty 18
  Filled 2021-11-20: qty 21

## 2021-11-20 MED ORDER — LACTULOSE 10 GM/15ML PO SOLN
10.0000 g | Freq: Every day | ORAL | Status: DC
  Administered 2021-11-20 – 2021-11-29 (×9): 10 g via ORAL
  Filled 2021-11-20 (×9): qty 15

## 2021-11-20 NOTE — Progress Notes (Signed)
[place] Progress Note Patient Name: [name] [name] DOB: 04/17/1956 MRN: [id number]   Date of Service  11/20/2021  HPI/Events of Note  Patient requests a sleep aid.   eICU Interventions  Plan: Melatonin 3 mg PO X 1.     Intervention Category Major Interventions: Other:  Lysle Dingwall 11/20/2021, 9:45 PM

## 2021-11-20 NOTE — Progress Notes (Signed)
Patient refused CPAP for the night,

## 2021-11-20 NOTE — Progress Notes (Addendum)
Patient ID: [name] [name], male   DOB: 1956/02/03, 66 y.o.   MRN: [id number]     Advanced Heart Failure Rounding Note  PCP-Cardiologist: None   Subjective:    Started CVVHD 5/26. Pulling 200/hr, 8.2L off yesterday. Negative 4.8 L for day  CO-OX 50% >> 49%  CVP 10-11  NE and Vaso off yesterday, added back Vaso 0.01 and NE 2 this am d/t marginal BP. TCTS added albumin.  Abx started 5/25 for suspected septic shock.  Afebrile. WBC down, 21>>16K > 14K > 12.8 > 16 > 17K.  Blood Cx NGTD.  Remains in AF, rate 90s at rest on amio gtt 60/hr>> decreased to 30/hr this am by TCTS  Ileus improving. BM this am. Ate some of his breakfast.  Fatigued. Feels wiped out after ambulating yesterday and large volume fluid removal  Echo: Difficult study. LV EF 55-60%, RV probably normal, no pericardial effusion.    Objective:   Weight Range: 116.6 kg Body mass index is 36.88 kg/m.   Vital Signs:   Temp:  [97 F (36.1 C)-98.5 F (36.9 C)] 98.5 F (36.9 C) (05/31 0645) Pulse Rate:  [80-111] 94 (05/31 0815) Resp:  [13-43] 15 (05/31 0815) BP: (79-132)/(40-112) 127/59 (05/31 0815) SpO2:  [81 %-100 %] 94 % (05/31 0815) Last BM Date : 11/19/21  Weight change: Filed Weights   11/17/21 0400 11/18/21 0500 11/19/21 0500  Weight: 130.5 kg 118.9 kg 116.6 kg    Intake/Output:   Intake/Output Summary (Last 24 hours) at 11/20/2021 0820 Last data filed at 11/20/2021 0800 Gross per 24 hour  Intake 3430.13 ml  Output 8154 ml  Net -4723.87 ml      Physical Exam   General:  Reclined in chair. No distress. Appears fatigued. HEENT: normal Neck: supple. JVP 10-12. Carotids 2+ bilat; no bruits. L IJ HD cath, R subclavian CVC Cor: PMI nondisplaced. Irregular rate & rhythm. No rubs, gallops or murmurs. Lungs: clear Abdomen: soft, nontender, + mildly distended.  Extremities: no cyanosis, clubbing, rash, 1-2 + edema Neuro: alert & orientedx3, cranial nerves grossly intact. moves all 4 extremities w/o  difficulty. Affect pleasant    Telemetry   Afib 90s, up to 100s-110s when ambulating (personally reviewed)   Labs    CBC Recent Labs    11/19/21 0429 11/20/21 0409  WBC 16.0* 17.1*  HGB 8.8* 9.1*  HCT 27.9* 29.3*  MCV 95.9 96.1  PLT 218 924   Basic Metabolic Panel Recent Labs    11/19/21 0429 11/19/21 1508 11/20/21 0409  NA 132* 134* 135  K 4.0 4.2 4.1  CL 99 99 100  CO2 23 23 22   GLUCOSE 165* 179* 131*  BUN 28* 30* 28*  CREATININE 2.27* 2.33* 2.32*  CALCIUM 8.1* 8.1* 8.4*  MG 2.7*  --  2.7*  PHOS 3.3 3.6 3.4   Liver Function Tests Recent Labs    11/18/21 0408 11/18/21 1628 11/19/21 1508 11/20/21 0409  AST 26  --   --   --   ALT 9  --   --   --   ALKPHOS 51  --   --   --   BILITOT 0.8  --   --   --   PROT 6.5  --   --   --   ALBUMIN 2.9*  3.0*   < > 3.1* 3.2*   < > = values in this interval not displayed.   No results for input(s): LIPASE, AMYLASE in the last 72 hours. Cardiac Enzymes No results  for input(s): CKTOTAL, CKMB, CKMBINDEX, TROPONINI in the last 72 hours.  BNP: BNP (last 3 results) No results for input(s): BNP in the last 8760 hours.  ProBNP (last 3 results) No results for input(s): PROBNP in the last 8760 hours.   D-Dimer No results for input(s): DDIMER in the last 72 hours. Hemoglobin A1C No results for input(s): HGBA1C in the last 72 hours. Fasting Lipid Panel No results for input(s): CHOL, HDL, LDLCALC, TRIG, CHOLHDL, LDLDIRECT in the last 72 hours. Thyroid Function Tests No results for input(s): TSH, T4TOTAL, T3FREE, THYROIDAB in the last 72 hours.  Invalid input(s): FREET3  Other results:   Imaging    DG Chest Port 1 View  Result Date: 11/20/2021 CLINICAL DATA:  Post CABG EXAM: PORTABLE CHEST 1 VIEW COMPARISON:  11/19/2021 FINDINGS: Left IJ introducer now overlies the distal left IJ. Right subclavian line is unchanged. Persistent left basilar and suprahilar opacities. Stable cardiomegaly. No pneumothorax. Small  left effusion. IMPRESSION: No significant change. Persistent left basilar in suprahilar opacities and small left effusion. Electronically Signed   By: Macy Mis M.D.   On: 11/20/2021 08:13   DG CHEST PORT 1 VIEW  Result Date: 11/19/2021 CLINICAL DATA:  Central line placement EXAM: PORTABLE CHEST 1 VIEW COMPARISON:  11/19/2021 FINDINGS: Interval placement of right subclavian central venous catheter with the tip at the cavoatrial junction. No pneumothorax Pre-existing right jugular sheath unchanged in position in the right innominate vein. Left jugular central venous catheter tip in the proximal SVC also unchanged Postop CABG with cardiac enlargement. Asymmetric airspace disease left upper lobe and left lower lobe unchanged. Small left effusion. Right lung remains clear. IMPRESSION: Right subclavian central venous catheter tip at the cavoatrial junction. No pneumothorax Asymmetric airspace disease on the left is unchanged. Electronically Signed   By: Franchot Gallo M.D.   On: 11/19/2021 11:39     Medications:     Scheduled Medications:  aspirin EC  325 mg Oral Daily   Or   aspirin  324 mg Per Tube Daily   bisacodyl  10 mg Rectal Q0600   Chlorhexidine Gluconate Cloth  6 each Topical Daily   docusate sodium  200 mg Oral Daily   fluticasone furoate-vilanterol  1 puff Inhalation Daily   guaiFENesin  1,200 mg Oral BID   insulin aspart  0-24 Units Subcutaneous Q4H   insulin detemir  12 Units Subcutaneous Q12H   ipratropium-albuterol  3 mL Nebulization TID   lactulose  10 g Oral TID   lidocaine  2 patch Transdermal Q24H   metoCLOPramide (REGLAN) injection  10 mg Intravenous Q6H   midodrine  15 mg Oral TID WC   multivitamin  1 tablet Oral QHS   pantoprazole  40 mg Oral Daily   rosuvastatin  20 mg Oral Daily   sodium chloride flush  10-40 mL Intracatheter Q12H   sodium chloride flush  3 mL Intravenous Q12H    Infusions:   prismasol BGK 4/2.5 400 mL/hr at 11/19/21 2213    prismasol BGK  4/2.5 400 mL/hr at 11/19/21 2221   sodium chloride Stopped (11/14/21 0549)   sodium chloride     sodium chloride Stopped (11/19/21 2303)   sodium chloride     albumin human 12.5 g (11/20/21 0810)   amiodarone 30 mg/hr (11/20/21 0800)   heparin 1,700 Units/hr (11/20/21 0800)   lactated ringers Stopped (11/18/21 2130)   lactated ringers Stopped (11/17/21 2054)   norepinephrine (LEVOPHED) Adult infusion 2 mcg/min (11/20/21 0800)   prismasol BGK  4/2.5 1,500 mL/hr at 11/19/21 2338   vasopressin 0.01 Units/min (11/20/21 0800)    PRN Medications: sodium chloride, heparin, HYDROmorphone (DILAUDID) injection, ondansetron (ZOFRAN) IV, oxyCODONE, sodium chloride, sodium chloride flush, sodium chloride flush    Patient Profile   65 y.o. male with history of CM with recovered EF, persistent atrial fibrillation, DM II, CKD, sick sinus syndrome, OSA, prior smoker. Underwent CABG X 3 and MAZE/PVI on 05/23>>post-op decrease in UOP and AKI with volume overload>>postcardiotomy shock requiring inotrope and pressor support  Assessment/Plan   1. Shock: Echo reviewed, LV EF 55-60%, RV poorly visualized but probably near-normal, no significant pericardial effusion. Suspect sepsis physiology with distributive shock.  Improved with broad spectrum abx. Lactate has cleared. Bcx 5/25 remain NGTD.  - Remain off milrinone. Wean off Vaso, can use NE as needed to maintain MAP - Continue midodrine to 15 mg TID to assist pressor wean - Vancomycin now off, completed course of cefepime 2. Acute on chronic primarily diastolic CHF: LV EF 65-46%, RV poorly visualized but probably near-normal, no significant pericardial effusion.  Patient is still volume overloaded, suspect primarily right heart failure physiology.  CVVHD started on 5/26. 8L off yesterday, -4.8L for day. Did not want to stand for weight today. CVP 10-11. Currently pulling for 200/hr >> decrease to 100/hr. Dr. Aundra Dubin discussed with Nephrology. Will continue  CRRT until filter expires or clots. See below - Continue TEDs 3. AKI on CKD stage 3: Baseline SCr 1.3-1.5 .  Suspect due to shock/ATN as well as elevated renal venous pressure in setting of volume overload, ileus, abdominal distention. CVVHD started 5/26. Remains nearly anuric. - Renal following - Transition off CRRT once filter expires or clots. No signs of renal recovery. Anticipating need for iHD.  4. CAD: S/p CABG. No s/s ischemia - Continue ASA, statin.  5. Atrial fibrillation with RVR: Has had Maze and LA appendage clip at time of CABG on 11/12/21. He remains in AF, rate 90s at rest up to 110s with activity - Continue heparin gtt - Agree with reducing amio gtt to 30/hr - Will need eventual TEE-DCCV.  6. DM2: SSI.  7. ID: WBCs 22>>16K > 14K -> 12.8 -> 16 -> 17K Septic shock. Lactate cleared - Completed vanc and cefepime. - Blood culture NGTD 8. Ileus: NGT.  Improving, had bowel movement today. Ate some of his breakfast this am. - Continue Reglan.  - Received Relastor 5/27   9. Pulmonary: Respiratory status improving with fluid removal and abx - CVVH for volume removal  - CCM following.   10. DM2 - on SSI  - Per primary  Length of Stay: [place], LINDSAY N, PA-C  11/20/2021, 8:20 AM  Advanced Heart Failure Team Pager [phone] (M-F; 7a - 5p)  Please contact [facility] for night-coverage after hours (5p -7a ) and weekends on [email or web address]  Patient seen with PA, agree with the above note.   He is on vasopressin 0.01 and NE 2 today. CVVH ongoing with UF up to 200 cc/hr.  CVP 10-11 today. Weight down 5 lbs.   He remains in atrial fibrillation on amiodarone gtt.   He is currently sleeping.   General: NAD Neck: Thick, JVP 10 cm, no thyromegaly or thyroid nodule.  Lungs: Clear to auscultation bilaterally with normal respiratory effort. CV: Nondisplaced PMI.  Heart irregular S1/S2, no S3/S4, no murmur.  Trace ankle edema.  Abdomen: Soft, nontender, no hepatosplenomegaly, no  distention.  Skin: Intact without lesions or rashes.  Neurologic: Alert and oriented  x 3.  Psych: Normal affect. Extremities: No clubbing or cyanosis.  HEENT: Normal.   Volume status improving, will decrease net UF today to 100 cc/hr and stop CVVH when filter clots.  Will then assess for iHD needs.   Should be able to wean down/off pressors today with slower UF.  Continue midodrine 15 mg tid.   She remains in AF on heparin gtt.   Can decrease amiodarone to 30 mg/hr.  Plan for TEE-guided DCCV hopefully tomorrow now that volume status is significantly improved.  This will also allow a better view of LV function as well.   CRITICAL CARE Performed by: Loralie Champagne  Total critical care time: 35 minutes  Critical care time was exclusive of separately billable procedures and treating other patients.  Critical care was necessary to treat or prevent imminent or life-threatening deterioration.  Critical care was time spent personally by me on the following activities: development of treatment plan with patient and/or surrogate as well as nursing, discussions with consultants, evaluation of patient's response to treatment, examination of patient, obtaining history from patient or surrogate, ordering and performing treatments and interventions, ordering and review of laboratory studies, ordering and review of radiographic studies, pulse oximetry and re-evaluation of patient's condition.   Loralie Champagne 11/20/2021 9:58 AM

## 2021-11-20 NOTE — Progress Notes (Signed)
Informed that patient is off CRRT and temp line is not in place, to be removed. WBC to high to consider TDC at this time per IR, will hold off. Appreciate CCM's assistance with temp line placement in AM. Tentatively planning on IHD tomorrow provided he is not having increasing pressor requirements. Will revisit TDC once cleared from an infectious standpoint.  Gean Quint, MD [facility]

## 2021-11-20 NOTE — Progress Notes (Signed)
Patient ID: [name] [name], male   DOB: 11-20-1955, 66 y.o.   MRN: [id number] [facility] Progress Note   Assessment/ Plan:   1.  Acute kidney injury on chronic kidney disease stage IIIa:  -AKI likely ischemic ATN in the setting of septic versus cardiogenic shock.  Started on CRRT 5/26 for extracorporeal volume unloading after he had fixed urine output on Lasix drip that was inadequate for volume unloading. -will transition off CRRT--no restart (let filter expire or clot--whichever comes first). Will transition to IHD depending on when he is able to get off CRRT/vaso. -no evidence of renal recovery at this junction, will need his HD catheter converted to [facility] down the road  2.  Coronary artery disease status post three-vessel CABG:  -on aspirin/statin. 3. Acute on chronic dCHF -UF as tolerated with HD, CVP still elevated per RN -milrinone stopped 5/29 4. Shock -possibly from sepsis. Abx/pressor support per primary service. Cxr suspicious for developing PNA 5.  Atrial fibrillation:  -Status post MAZE procedure at the time of CABG. Per primary service, on amio. 6.  Anemia:  -Likely associated with recent surgery and postoperative inflammatory response.  transfuse PRN for hgb <7  Discussed w/ primary service   Subjective:   Patient seen and examined on CRRT. Tolerating treatment, no acute events. on low dose vaso. Net neg 4.7L. feels very weak today. Thinks it was due to a lot PT yesterday. Cvp down to 11 per RN.   Objective:   BP (!) 127/59   Pulse 94   Temp 98.5 F (36.9 C) (Axillary)   Resp 15   Ht 5\' 10"  (1.778 m)   Wt 116.6 kg   SpO2 94%   BMI 36.88 kg/m   Intake/Output Summary (Last 24 hours) at 11/20/2021 0818 Last data filed at 11/20/2021 0800 Gross per 24 hour  Intake 3430.13 ml  Output 7947 ml  Net -4516.87 ml   Weight change:   Physical Exam: Gen: NAD, in recliner CVS: irregular tachycardia, S1 and S2 normal, clean midline sternotomy incision Resp:  diminished air entry bibasilar, no w/r/r/c, normal WOB, on RA Abd: Soft, obese, nontender, bowel sounds normal Ext: no sig edema b/l LEs Neuro: awake, alert Dialysis access: LIJ temp line  Imaging: DG Chest Port 1 View  Result Date: 11/20/2021 CLINICAL DATA:  Post CABG EXAM: PORTABLE CHEST 1 VIEW COMPARISON:  11/19/2021 FINDINGS: Left IJ introducer now overlies the distal left IJ. Right subclavian line is unchanged. Persistent left basilar and suprahilar opacities. Stable cardiomegaly. No pneumothorax. Small left effusion. IMPRESSION: No significant change. Persistent left basilar in suprahilar opacities and small left effusion. Electronically Signed   By: Macy Mis M.D.   On: 11/20/2021 08:13   DG CHEST PORT 1 VIEW  Result Date: 11/19/2021 CLINICAL DATA:  Central line placement EXAM: PORTABLE CHEST 1 VIEW COMPARISON:  11/19/2021 FINDINGS: Interval placement of right subclavian central venous catheter with the tip at the cavoatrial junction. No pneumothorax Pre-existing right jugular sheath unchanged in position in the right innominate vein. Left jugular central venous catheter tip in the proximal SVC also unchanged Postop CABG with cardiac enlargement. Asymmetric airspace disease left upper lobe and left lower lobe unchanged. Small left effusion. Right lung remains clear. IMPRESSION: Right subclavian central venous catheter tip at the cavoatrial junction. No pneumothorax Asymmetric airspace disease on the left is unchanged. Electronically Signed   By: Franchot Gallo M.D.   On: 11/19/2021 11:39   DG CHEST PORT 1 VIEW  Result Date: 11/19/2021 CLINICAL  DATA:  Pneumonia, history of CHF, CABG on 11/12/2021 EXAM: PORTABLE CHEST 1 VIEW COMPARISON:  Chest radiograph 11/18/2021 FINDINGS: The right IJ vascular sheath and left IJ vascular catheter are in stable position terminating in the region of the right brachiocephalic vein and confluence of the brachiocephalic veins, respectively. Median sternotomy  wires are unchanged. The cardiac silhouette is enlarged, unchanged. The upper mediastinal contours are stable. Retrocardiac and patchy left suprahilar opacities are not significantly changed. There are probable trace bilateral pleural effusions. There is no overt pulmonary edema. There is no new or worsening focal airspace disease. Overall, aeration is not significantly changed. There is no pneumothorax. There is no acute osseous abnormality. IMPRESSION: Unchanged retrocardiac and left perihilar opacities and probable trace bilateral pleural effusions. Overall, no significant interval change since 11/18/2021. Electronically Signed   By: Valetta Mole M.D.   On: 11/19/2021 07:59    Labs: BMET Recent Labs  Lab 11/17/21 0436 11/17/21 1600 11/18/21 0408 11/18/21 1628 11/19/21 0429 11/19/21 1508 11/20/21 0409  NA 135 134* 136 137 132* 134* 135  K 4.0 4.3 4.4 4.5 4.0 4.2 4.1  CL 100 100 102 103 99 99 100  CO2 24 24 24 26 23 23 22   GLUCOSE 167* 164* 165* 183* 165* 179* 131*  BUN 29* 28* 26* 31* 28* 30* 28*  CREATININE 2.87* 2.67* 2.57* 2.76* 2.27* 2.33* 2.32*  CALCIUM 7.6* 8.0* 8.2* 7.8* 8.1* 8.1* 8.4*  PHOS 3.5 3.1 3.2 3.0 3.3 3.6 3.4   CBC Recent Labs  Lab 11/17/21 0436 11/18/21 0408 11/19/21 0429 11/20/21 0409  WBC 12.9* 12.8* 16.0* 17.1*  HGB 7.8* 8.1* 8.8* 9.1*  HCT 24.6* 25.9* 27.9* 29.3*  MCV 95.3 96.3 95.9 96.1  PLT 175 190 218 246    Medications:     aspirin EC  325 mg Oral Daily   Or   aspirin  324 mg Per Tube Daily   bisacodyl  10 mg Rectal Q0600   Chlorhexidine Gluconate Cloth  6 each Topical Daily   docusate sodium  200 mg Oral Daily   fluticasone furoate-vilanterol  1 puff Inhalation Daily   guaiFENesin  1,200 mg Oral BID   insulin aspart  0-24 Units Subcutaneous Q4H   insulin detemir  12 Units Subcutaneous Q12H   ipratropium-albuterol  3 mL Nebulization TID   lactulose  10 g Oral TID   lidocaine  2 patch Transdermal Q24H   metoCLOPramide (REGLAN) injection   10 mg Intravenous Q6H   midodrine  15 mg Oral TID WC   multivitamin  1 tablet Oral QHS   pantoprazole  40 mg Oral Daily   rosuvastatin  20 mg Oral Daily   sodium chloride flush  10-40 mL Intracatheter Q12H   sodium chloride flush  3 mL Intravenous Q12H   Gean Quint, MD [facility] 11/20/2021, 8:18 AM

## 2021-11-20 NOTE — Progress Notes (Signed)
Physical Therapy Treatment Patient Details Name: [name] [name] MRN: [id number] DOB: 01/13/1956 Today's Date: 11/20/2021   History of Present Illness Pt is a 66 y.o. male admitted 11/12/21 for multivessel CABG with Maze procedure; extubated 5/24. Postop course complicated by oliguria and NSVT vs. Afib/RVR, shock, worsening respiratory status, suspected ileus. Pt also with worsening renal function; CRRT initiated 5/26. PMH includes CAD, CHF, CKD, DM2, HTN, afib (chronic anticoagulation), OSA.   PT Comments    Pt progressing with mobility. Today's session focused on ambulation for improving strength and activity tolerance, gait training with rollator. Pt tolerating ~305' hallway ambulation with intermittent minA; pt requiring prolonged seated rest breaks secondary to c/o fatigue, dizziness, weakness. Pt requiring max encouragement to progress activity; appreciate motivation from nursing to staff to mobilize as well. Will continue to follow acutely to address established goals.    Recommendations for follow up therapy are one component of a multi-disciplinary discharge planning process, led by the attending physician.  Recommendations may be updated based on patient status, additional functional criteria and insurance authorization.  Follow Up Recommendations  Home health PT     Assistance Recommended at Discharge Intermittent Supervision/Assistance  Patient can return home with the following A little help with walking and/or transfers;A little help with bathing/dressing/bathroom;Assistance with cooking/housework;Assist for transportation;Help with stairs or ramp for entrance   Equipment Recommendations   (TBD - rollator)    Recommendations for Other Services       Precautions / Restrictions Precautions Precautions: Fall;Sternal;Other (comment) Precaution Comments: CRRT (RN able to disconnect for session) Restrictions Weight Bearing Restrictions: Yes (sternal precautions)     Mobility   Bed Mobility               General bed mobility comments: received sitting in recliner    Transfers Overall transfer level: Needs assistance Equipment used: None, Rollator (4 wheels) Transfers: Sit to/from Stand Sit to Stand: Min guard, Min assist           General transfer comment: Initial sit<>stand from recliner without DME in order to get standing weight, min guard for balance; additional 2x sit<>stand from rollator seat, cues for sequencing with brakes, min guard initial trial, minA on second trial to stabilize    Ambulation/Gait Ambulation/Gait assistance: Min guard, Min assist, +2 safety/equipment Gait Distance (Feet): 305 Feet (w/ 2x seated rest breaks) Assistive device: Rollator (4 wheels) Gait Pattern/deviations: Step-through pattern, Decreased stride length, Drifts right/left, Trunk flexed Gait velocity: Decreased     General Gait Details: slow, fatigued gait with rollator and min guard for balance; pt requiring 2x prolonged seated rest breaks secondary to c/o fatigue, weakness an ddizziness, requires encouragement to continue ambulating after each break; 2x instability requiring minA to correct when turning to  face rollator after standing from rollator seat; HR 100s   Stairs             Wheelchair Mobility    Modified Rankin (Stroke Patients Only)       Balance Overall balance assessment: Needs assistance Sitting-balance support: No upper extremity supported, Feet supported Sitting balance-Leahy Scale: Fair     Standing balance support: Bilateral upper extremity supported, During functional activity, Reliant on assistive device for balance Standing balance-Leahy Scale: Fair Standing balance comment: can static stand without UE support; static and dynamic stability improved with UE support                            Cognition Arousal/Alertness: Awake/alert Behavior  During Therapy: WFL for tasks assessed/performed, Flat  affect Overall Cognitive Status: Within Functional Limits for tasks assessed                                 General Comments: WFL for simple tasks; intermittent cues for sternal precautions; some anxiety related to mobility, requires encouragement to progress        Exercises      General Comments General comments (skin integrity, edema, etc.): pt's "ex-wife" present during session; pt endorses significant fatigue with ambulation, RN present and aware. RN disconnected CRRt to allow for activity progression outside of room      Pertinent Vitals/Pain Pain Assessment Pain Assessment: Faces Faces Pain Scale: Hurts little more Pain Location: generalized Pain Descriptors / Indicators: Discomfort, Operative site guarding, Tiring Pain Intervention(s): Monitored during session, Limited activity within patient's tolerance    Home Living                          Prior Function            PT Goals (current goals can now be found in the care plan section) Progress towards PT goals: Progressing toward goals    Frequency    Min 3X/week      PT Plan Current plan remains appropriate    Co-evaluation              AM-PAC PT "6 Clicks" Mobility   Outcome Measure  Help needed turning from your back to your side while in a flat bed without using bedrails?: A Little Help needed moving from lying on your back to sitting on the side of a flat bed without using bedrails?: A Lot Help needed moving to and from a bed to a chair (including a wheelchair)?: A Little Help needed standing up from a chair using your arms (e.g., wheelchair or bedside chair)?: A Little Help needed to walk in hospital room?: A Little Help needed climbing 3-5 steps with a railing? : A Lot 6 Click Score: 16    End of Session Equipment Utilized During Treatment: Gait belt Activity Tolerance: Patient tolerated treatment well Patient left: in chair;with call bell/phone within reach;with  nursing/sitter in room;with family/visitor present Nurse Communication: Mobility status PT Visit Diagnosis: Other abnormalities of gait and mobility (R26.89);Muscle weakness (generalized) (M62.81);Unsteadiness on feet (R26.81);Difficulty in walking, not elsewhere classified (R26.2)     Time: 2595-6387 PT Time Calculation (min) (ACUTE ONLY): 18 min  Charges:  $Therapeutic Exercise: 8-22 mins                     Mabeline Caras, PT, DPT Acute Rehabilitation Services  Pager [phone] Office [phone]  Derry Lory 11/20/2021, 11:09 AM

## 2021-11-20 NOTE — Progress Notes (Addendum)
TCTS DAILY ICU PROGRESS NOTE                   [address]            [place] [address]          [phone]   8 Days Post-Op Procedure(s) (LRB): CORONARY ARTERY BYPASS GRAFTING (CABG) x  3 USING LEFT INTERNAL MAMMARY ARTERY AND RIGHT GREATER SAPHENOUS VEIN (N/A) MAZE (N/A) TRANSESOPHAGEAL ECHOCARDIOGRAM (TEE) (N/A) ENDOVEIN HARVEST OF GREATER SAPHENOUS VEIN  Total Length of Stay:  LOS: 8 days   Subjective: Patient sitting in chair this am. He hurts all over. He had a large bowel movement earlier this am. He did eat some breakfast.  Objective: Vital signs in last 24 hours: Temp:  [95.7 F (35.4 C)-98.5 F (36.9 C)] 98.5 F (36.9 C) (05/31 0645) Pulse Rate:  [80-112] 98 (05/31 0600) Cardiac Rhythm: Atrial fibrillation (05/31 0400) Resp:  [13-43] 17 (05/31 0600) BP: (83-132)/(52-109) 83/56 (05/31 0600) SpO2:  [81 %-98 %] 97 % (05/31 0600)  Filed Weights   11/17/21 0400 11/18/21 0500 11/19/21 0500  Weight: 130.5 kg 118.9 kg 116.6 kg    Weight change:    Hemodynamic parameters for last 24 hours: CVP:  [8 mmHg-25 mmHg] 12 mmHg  Intake/Output from previous day: 05/30 0701 - 05/31 0700 In: 3152.9 [P.O.:1600; I.V.:1253; IV Piggyback:199.9] Out: 7106 [Urine:10]  Intake/Output this shift: No intake/output data recorded.  Current Meds: Scheduled Meds:  aspirin EC  325 mg Oral Daily   Or   aspirin  324 mg Per Tube Daily   bisacodyl  10 mg Rectal Q0600   Chlorhexidine Gluconate Cloth  6 each Topical Daily   docusate sodium  200 mg Oral Daily   fluticasone furoate-vilanterol  1 puff Inhalation Daily   guaiFENesin  1,200 mg Oral BID   insulin aspart  0-24 Units Subcutaneous Q4H   insulin detemir  12 Units Subcutaneous Q12H   ipratropium-albuterol  3 mL Nebulization TID   lactulose  10 g Oral TID   lidocaine  2 patch Transdermal Q24H   metoCLOPramide (REGLAN) injection  10 mg Intravenous Q6H   midodrine  15 mg Oral TID WC   multivitamin  1 tablet  Oral QHS   pantoprazole  40 mg Oral Daily   rosuvastatin  20 mg Oral Daily   sodium chloride flush  10-40 mL Intracatheter Q12H   sodium chloride flush  3 mL Intravenous Q12H   Continuous Infusions:   prismasol BGK 4/2.5 400 mL/hr at 11/19/21 2213    prismasol BGK 4/2.5 400 mL/hr at 11/19/21 2221   sodium chloride Stopped (11/14/21 0549)   sodium chloride     sodium chloride Stopped (11/19/21 2303)   sodium chloride     amiodarone 60 mg/hr (11/20/21 0600)   heparin 1,700 Units/hr (11/20/21 0600)   lactated ringers Stopped (11/18/21 2130)   lactated ringers Stopped (11/17/21 2054)   norepinephrine (LEVOPHED) Adult infusion Stopped (11/19/21 2694)   prismasol BGK 4/2.5 1,500 mL/hr at 11/19/21 2338   vasopressin Stopped (11/19/21 2303)   PRN Meds:.sodium chloride, heparin, HYDROmorphone (DILAUDID) injection, ondansetron (ZOFRAN) IV, oxyCODONE, sodium chloride, sodium chloride flush, sodium chloride flush  General appearance: alert, cooperative, and no distress Neurologic: intact Heart: IRRR IRRR Lungs: Slightly diminished bibasilar breath sounds Abdomen: Soft obese, bowel sounds Extremities:No LE edema. Ecchymosis right thigh Wound: Sternal wound and RLE wounds are clean and dry  Lab Results: CBC: Recent Labs    11/19/21 0429 11/20/21  0409  WBC 16.0* 17.1*  HGB 8.8* 9.1*  HCT 27.9* 29.3*  PLT 218 246    BMET:  Recent Labs    11/19/21 1508 11/20/21 0409  NA 134* 135  K 4.2 4.1  CL 99 100  CO2 23 22  GLUCOSE 179* 131*  BUN 30* 28*  CREATININE 2.33* 2.32*  CALCIUM 8.1* 8.4*     CMET: Lab Results  Component Value Date   WBC 17.1 (H) 11/20/2021   HGB 9.1 (L) 11/20/2021   HCT 29.3 (L) 11/20/2021   PLT 246 11/20/2021   GLUCOSE 131 (H) 11/20/2021   ALT 9 11/18/2021   AST 26 11/18/2021   NA 135 11/20/2021   K 4.1 11/20/2021   CL 100 11/20/2021   CREATININE 2.32 (H) 11/20/2021   BUN 28 (H) 11/20/2021   CO2 22 11/20/2021   INR 1.4 (H) 11/12/2021   HGBA1C  9.3 (H) 11/08/2021      PT/INR:  No results for input(s): LABPROT, INR in the last 72 hours.  Radiology: DG CHEST PORT 1 VIEW  Result Date: 11/19/2021 CLINICAL DATA:  Central line placement EXAM: PORTABLE CHEST 1 VIEW COMPARISON:  11/19/2021 FINDINGS: Interval placement of right subclavian central venous catheter with the tip at the cavoatrial junction. No pneumothorax Pre-existing right jugular sheath unchanged in position in the right innominate vein. Left jugular central venous catheter tip in the proximal SVC also unchanged Postop CABG with cardiac enlargement. Asymmetric airspace disease left upper lobe and left lower lobe unchanged. Small left effusion. Right lung remains clear. IMPRESSION: Right subclavian central venous catheter tip at the cavoatrial junction. No pneumothorax Asymmetric airspace disease on the left is unchanged. Electronically Signed   By: Franchot Gallo M.D.   On: 11/19/2021 11:39     Assessment/Plan: S/P Procedure(s) (LRB): CORONARY ARTERY BYPASS GRAFTING (CABG) x  3 USING LEFT INTERNAL MAMMARY ARTERY AND RIGHT GREATER SAPHENOUS VEIN (N/A) MAZE (N/A) TRANSESOPHAGEAL ECHOCARDIOGRAM (TEE) (N/A) ENDOVEIN HARVEST OF GREATER SAPHENOUS VEIN CV-History of persistent atrial fibrillation. A flutter/a fib post op.  On Amiodarone and heparin drips. Also, on Midodrine 15 mg tid. Co ox this am decreased to 48.5;has been off Milrinone drip for days. Likely DCCV prior to discharge. Pulmonary-History of OSA on CPAP. On 2 liters via [place]. CXR this am appears stable. Continue Breo Ellipta and Xopenex. Encourage incentive spirometer and flutter valve. 3. Acute on chronic CHF- CVP 10-12. Appreciate advanced heart failure's assistance 4. Expected post op blood loss anemia-H and H this am stable at 9.1 and 29.3 5. DM-CBGs 170/134/150. On Insulin. Pre op HGA1C 9.3. Will not restart Metformin or Glipizide yet as creatinine elevated. Will need close medical follow up after discharge 6. AKI on  CKD (stage III)-Creatinine decreased to 2.32. Nephrology following and arranging for CVVH 7. Leukocytosis, afebrile-WBC slightly increased to 17,100.New central line placed yesterday.  Blood cultures show NGTD 8. GI-post op ileus resolved. On Reglan. Tolerating diet, bowle movement 9. Physical deconditioning-home PT ordered 10. Patient still with wires but on Heparin drip. Will discuss with Dr. Prescott Gum.  Donielle Liston Alba PA-C 11/20/2021 7:03 AM   Appears dry- will give some 25% albumin Use prn norepi to keep SAP > 100  patient examined and medical record reviewed,agree with above note. Dahlia Byes 11/20/2021

## 2021-11-20 NOTE — Progress Notes (Signed)
[place] for IV heparin Indication: atrial fibrillation  No Known Allergies  Patient Measurements: Height: 5\' 10"  (177.8 cm) Weight: 116.6 kg (257 lb 0.9 oz) IBW/kg (Calculated) : 73 Heparin Dosing Weight: ~ 100 kg  Vital Signs: Temp: 98.5 F (36.9 C) (05/31 0645) Temp Source: Axillary (05/31 0645) BP: 91/53 (05/31 0730) Pulse Rate: 101 (05/31 0730)  Labs: Recent Labs    11/18/21 0408 11/18/21 0849 11/18/21 1628 11/19/21 0429 11/19/21 1508 11/20/21 0409  HGB 8.1*  --   --  8.8*  --  9.1*  HCT 25.9*  --   --  27.9*  --  29.3*  PLT 190  --   --  218  --  246  HEPARINUNFRC  --    < > 0.43 0.49  --  0.36  CREATININE 2.57*  --  2.76* 2.27* 2.33* 2.32*   < > = values in this interval not displayed.     Estimated Creatinine Clearance: 39.5 mL/min (A) (by C-G formula based on SCr of 2.32 mg/dL (H)).   Medical History: Past Medical History:  Diagnosis Date   Atrial fibrillation ([place])    Benign essential HTN 07/30/2014   Bradycardia 01/25/2015   CHF (congestive heart failure) (HCC)    Chronic anticoagulation 85/07/7739   Chronic systolic CHF (congestive heart failure), NYHA class 3 ([place]) 08/30/2014   Overview:  Global ef 30%   CKD (chronic kidney disease)    Class 3 severe obesity in adult ([place]) 04/28/2017   Coronary artery disease    Diabetes mellitus without complication (HCC)    Diabetic neuropathy ([place]) 07/28/2014   Dysrhythmia    Erectile dysfunction    High risk medication use 05/26/2018   Hypertension    Hypertensive heart disease with heart failure ([place]) 07/30/2014   Lumbago    LV dysfunction 04/28/2017   Mixed hyperlipidemia 07/28/2014   OSA (obstructive sleep apnea)    CPAP   Paroxysmal atrial fibrillation ([place]) 07/28/2014   Sinus node dysfunction ([place]) 04/21/2016   Testicular hypofunction    Uncontrolled type 2 diabetes mellitus with microalbuminuric diabetic nephropathy 07/28/2014   Assessment: 66 yo male  on chronic Xarelto for afib.  Now s/p CABG with MAZE, and on CRRT.  Remains in afib.  Pharmacy asked to resume anticoagulation with IV heparin. Targeting lowering heparin level goal given recent OHS.  Heparin level remains therapeutic at 0.36, H/H and pltc stable.  Goal of Therapy:  Heparin level 0.3-0.5 Monitor platelets by anticoagulation protocol: Yes   Plan:  Continue heparin 1700 units/hr Daily heparin level and CBC  Arrie Senate, PharmD, [place], [facility] Clinical Pharmacist [phone] Please check AMION for all [facility] Pharmacy numbers 11/20/2021

## 2021-11-20 NOTE — Progress Notes (Signed)
Patient disconnected from CRRT machine in order to work with PT in the hallway. Once back in chair, patient reconnected. Issues with access pressures and CRRT would not run. Chand MD at bedside (who placed the line) and notified this RN that line pulled out from where he placed it and that the line will need to come out completely. Nephrology notified. CRRT will be held until IR can place a tunneled HD catheter. Ria Comment [place] and Prescott Gum MD notified of events.

## 2021-11-20 NOTE — Progress Notes (Signed)
Patient ID: [name] [name], male   DOB: July 13, 1955, 66 y.o.   MRN: [id number]  TCTS Evening Rounds:  Hemodynamically stable in AF with controlled rate on IV amio.  CVP 12, NE 4 mcg.  Anuric and creat up to 3.21 this evening. CRRT to start.

## 2021-11-20 NOTE — Progress Notes (Signed)
NAME:  [name] [name], MRN:  [id number], DOB:  1955/12/09, LOS: 8 ADMISSION DATE:  11/12/2021, CONSULTATION DATE:  11/13/2021 REFERRING MD:  Dr. Aundra Dubin , CHIEF COMPLAINT:  Respiratory distress    History of Present Illness:  [name] [name] is a 66 y.o. male with a PMH significant for but not limited to CAD now s/p CABG, systolic congestive heart failure, CKD, type 2 diabetes, HTN, A-fib chronically anticoagulated, and OSA who presented to the ED 5/23 for elective CABG with MAZE procedure Dr. Lawson Fiscal.  Patient was extubated per heart failure protocol with no incident by by afternoon of 5/24 PCCM was consulted for progressive increase work of breathing.   Pertinent  Medical History  CAD no s/p CABG, systolic congestive heart failure, CKD, type 2 diabetes, HTN, A-fib chronically anticoagulated, and OSA   Significant Hospital Events: Including procedures, antibiotic start and stop dates in addition to other pertinent events   5/23 underwent CABG with MAZE procedure with Dr. Lawson Fiscal 5/24 PCCM consulted for progressive increased work of breathing  5/25 Urine output remained low overnight along with soft BP which prompted addition of vaso and low dose bicarb drip  5/26 CRRT started  Interim History / Subjective:  Patient remains on room air Levophed was restarted this morning Remains on CRRT, with a net -5.4 L since yesterday He stated not feeling as well as yesterday  Objective   Blood pressure (!) 102/59, pulse (!) 102, temperature 98.5 F (36.9 C), temperature source Axillary, resp. rate (!) 21, height 5\' 10"  (1.778 m), weight 116.6 kg, SpO2 98 %. CVP:  [5 mmHg-25 mmHg] 8 mmHg      Intake/Output Summary (Last 24 hours) at 11/20/2021 1101 Last data filed at 11/20/2021 1000 Gross per 24 hour  Intake 3018.45 ml  Output 7600 ml  Net -4581.55 ml   Filed Weights   11/17/21 0400 11/18/21 0500 11/19/21 0500  Weight: 130.5 kg 118.9 kg 116.6 kg    Examination: Physical exam: General:  Acute on chronically ill-appearing obese male, sitting in the recliner HEENT: [place]/AT, eyes anicteric.  moist mucus membranes Neuro: Alert, awake following commands, following commands Chest: Coarse breath sounds, no wheezes or rhonchi Heart: Irregularly irregular, no murmurs or gallops Abdomen: Soft, obese, nontender, nondistended, bowel sounds present Skin: No rash  Resolved Hospital Problem list     Assessment & Plan:  CAD S/P CABG x3 Underwent elective CABG with Dr. Lawson Fiscal 5/23 Continue aspirin and Crestor  Shock mixed cardiogenic and septic Acute on chronic diastolic CHF Continue antibiotics with cefepime Cultures have been negative so far Vasopressin was stopped He was restarted back on Levophed low-dose to maintain map goal 65 Continue midodrine to 15 mg 3 times daily Coox decreased to 48% today from 61% yesterday EF is 55-60% Currently on CRRT for volume removal Remain off milrinone for few days  Chronic atrial fibrillation with rapid ventricular response, status post maze procedure and LAA appendage clip Continue IV amiodarone Continue IV heparin infusion Eventually needs TEE/DCCV   Acute hypoxemic respiratory failure, resolved Patient is off oxygen Continue CPAP nightly  Constipation Had 2 bowel movements since addition of lactulose yesterday Decrease lactulose to once daily now   Poorly controlled diabetes type 2 Hemoglobin A1c is 9.3 Home medications include Glipizide, Metformin, and Farxiga  Continue Levemir and SSI with CBG goal 140-180   Oliguric AKI superimposed on CKD stage 3a due to ischemic ATN Continue CRRT Nephrology is following We will decrease volume pulled through CRRT as he remained net -7 L  in last 48 hours  Illeus- improved Received multiple doses Relistor Mobilization will help Advance diet as tolerated   Best Practice (right click and "Reselect all SmartList Selections" daily)   Diet/type: cardiac diet DVT prophylaxis: heparin  gtt GI prophylaxis: PPI Lines: Central line Foley:  Yes, and it is still needed Code Status:  full code Last date of multidisciplinary goals of care discussion: Per primary     Total critical care time: 32 minutes  Performed by: [place] care time was exclusive of separately billable procedures and treating other patients.   Critical care was necessary to treat or prevent imminent or life-threatening deterioration.   Critical care was time spent personally by me on the following activities: development of treatment plan with patient and/or surrogate as well as nursing, discussions with consultants, evaluation of patient's response to treatment, examination of patient, obtaining history from patient or surrogate, ordering and performing treatments and interventions, ordering and review of laboratory studies, ordering and review of radiographic studies, pulse oximetry and re-evaluation of patient's condition.   Jacky Kindle MD [facility] See Amion for pager If no response to pager, please call [phone] until 7pm After 7pm, Please call E-link [phone]

## 2021-11-20 NOTE — Progress Notes (Signed)
IR received request to evaluate this patient for a tunneled dialysis catheter. The patient's WBC today is 17.1. IR will be unable to place a TDC at this time. When leukocytosis has resolved IR can re-evaluate. Ordering provider made aware.  No procedure planned. IR will leave the current order in place for now.   Soyla Dryer, [place] [phone] 11/20/2021, 12:35 PM

## 2021-11-21 ENCOUNTER — Inpatient Hospital Stay (HOSPITAL_COMMUNITY): Payer: Medicare HMO

## 2021-11-21 ENCOUNTER — Inpatient Hospital Stay (HOSPITAL_COMMUNITY): Payer: Medicare HMO | Admitting: Certified Registered"

## 2021-11-21 ENCOUNTER — Encounter (HOSPITAL_COMMUNITY)
Admission: RE | Disposition: A | Discharge status: Home / Self Care | Payer: Self-pay | Source: Home / Self Care | Attending: Cardiothoracic Surgery

## 2021-11-21 ENCOUNTER — Encounter (HOSPITAL_COMMUNITY): Payer: Self-pay | Admitting: Cardiothoracic Surgery

## 2021-11-21 DIAGNOSIS — I480 Paroxysmal atrial fibrillation: Secondary | ICD-10-CM | POA: Diagnosis not present

## 2021-11-21 DIAGNOSIS — R570 Cardiogenic shock: Secondary | ICD-10-CM | POA: Diagnosis not present

## 2021-11-21 DIAGNOSIS — N179 Acute kidney failure, unspecified: Secondary | ICD-10-CM | POA: Diagnosis not present

## 2021-11-21 DIAGNOSIS — I509 Heart failure, unspecified: Secondary | ICD-10-CM

## 2021-11-21 DIAGNOSIS — I340 Nonrheumatic mitral (valve) insufficiency: Secondary | ICD-10-CM | POA: Diagnosis not present

## 2021-11-21 DIAGNOSIS — Z951 Presence of aortocoronary bypass graft: Secondary | ICD-10-CM | POA: Diagnosis not present

## 2021-11-21 DIAGNOSIS — I4891 Unspecified atrial fibrillation: Secondary | ICD-10-CM

## 2021-11-21 DIAGNOSIS — I110 Hypertensive heart disease with heart failure: Secondary | ICD-10-CM | POA: Diagnosis not present

## 2021-11-21 DIAGNOSIS — I2510 Atherosclerotic heart disease of native coronary artery without angina pectoris: Secondary | ICD-10-CM

## 2021-11-21 HISTORY — PX: CARDIOVERSION: SHX1299

## 2021-11-21 HISTORY — PX: TEE WITHOUT CARDIOVERSION: SHX5443

## 2021-11-21 HISTORY — PX: BUBBLE STUDY: SHX6837

## 2021-11-21 LAB — GLUCOSE, CAPILLARY
Glucose-Capillary: 128 mg/dL — ABNORMAL HIGH (ref 70–99)
Glucose-Capillary: 145 mg/dL — ABNORMAL HIGH (ref 70–99)
Glucose-Capillary: 157 mg/dL — ABNORMAL HIGH (ref 70–99)
Glucose-Capillary: 227 mg/dL — ABNORMAL HIGH (ref 70–99)
Glucose-Capillary: 342 mg/dL — ABNORMAL HIGH (ref 70–99)
Glucose-Capillary: 224 mg/dL — ABNORMAL HIGH (ref 70–99)

## 2021-11-21 LAB — RENAL FUNCTION PANEL
Albumin: 3 g/dL — ABNORMAL LOW (ref 3.5–5.0)
Albumin: 2.9 g/dL — ABNORMAL LOW (ref 3.5–5.0)
Anion gap: 14 (ref 5–15)
Anion gap: 17 — ABNORMAL HIGH (ref 5–15)
BUN: 52 mg/dL — ABNORMAL HIGH (ref 8–23)
BUN: 62 mg/dL — ABNORMAL HIGH (ref 8–23)
CO2: 21 mmol/L — ABNORMAL LOW (ref 22–32)
CO2: 17 mmol/L — ABNORMAL LOW (ref 22–32)
Calcium: 8.6 mg/dL — ABNORMAL LOW (ref 8.9–10.3)
Calcium: 8.1 mg/dL — ABNORMAL LOW (ref 8.9–10.3)
Chloride: 99 mmol/L (ref 98–111)
Chloride: 96 mmol/L — ABNORMAL LOW (ref 98–111)
Creatinine, Ser: 4.74 mg/dL — ABNORMAL HIGH (ref 0.61–1.24)
Creatinine, Ser: 5.54 mg/dL — ABNORMAL HIGH (ref 0.61–1.24)
GFR, Estimated: 13 mL/min — ABNORMAL LOW (ref >60)
GFR, Estimated: 11 mL/min — ABNORMAL LOW (ref >60)
Glucose, Bld: 123 mg/dL — ABNORMAL HIGH (ref 70–99)
Glucose, Bld: 279 mg/dL — ABNORMAL HIGH (ref 70–99)
Phosphorus: 6.1 mg/dL — ABNORMAL HIGH (ref 2.5–4.6)
Phosphorus: 7.3 mg/dL — ABNORMAL HIGH (ref 2.5–4.6)
Potassium: 4.1 mmol/L (ref 3.5–5.1)
Potassium: 4.4 mmol/L (ref 3.5–5.1)
Sodium: 134 mmol/L — ABNORMAL LOW (ref 135–145)
Sodium: 130 mmol/L — ABNORMAL LOW (ref 135–145)

## 2021-11-21 LAB — HEPATITIS B SURFACE ANTIBODY, QUANTITATIVE: Hep B S AB Quant (Post): <3.1 m[IU]/mL — ABNORMAL LOW (ref >9.9)

## 2021-11-21 LAB — CBC
HCT: 27.9 % — ABNORMAL LOW (ref 39.0–52.0)
Hemoglobin: 8.7 g/dL — ABNORMAL LOW (ref 13.0–17.0)
MCH: 29.8 pg (ref 26.0–34.0)
MCHC: 31.2 g/dL (ref 30.0–36.0)
MCV: 95.5 fL (ref 80.0–100.0)
Platelets: 269 10*3/uL (ref 150–400)
RBC: 2.92 MIL/uL — ABNORMAL LOW (ref 4.22–5.81)
RDW: 15.4 % (ref 11.5–15.5)
WBC: 19.8 10*3/uL — ABNORMAL HIGH (ref 4.0–10.5)
nRBC: 0.2 % (ref 0.0–0.2)

## 2021-11-21 LAB — COOXEMETRY PANEL
Carboxyhemoglobin: 1.3 % (ref 0.5–1.5)
Methemoglobin: <0.7 % (ref 0.0–1.5)
O2 Saturation: 56.1 %
Total hemoglobin: 8.4 g/dL — ABNORMAL LOW (ref 12.0–16.0)

## 2021-11-21 LAB — MAGNESIUM: Magnesium: 2.8 mg/dL — ABNORMAL HIGH (ref 1.7–2.4)

## 2021-11-21 LAB — HEPARIN LEVEL (UNFRACTIONATED): Heparin Unfractionated: 0.48 IU/mL (ref 0.30–0.70)

## 2021-11-21 SURGERY — ECHOCARDIOGRAM, TRANSESOPHAGEAL
Anesthesia: Monitor Anesthesia Care

## 2021-11-21 MED ORDER — HEPARIN SODIUM (PORCINE) 1000 UNIT/ML DIALYSIS
1000.0000 [IU] | INTRAMUSCULAR | Status: DC | PRN
  Administered 2021-11-22 – 2021-12-02 (×5): 1000 [IU]
  Filled 2021-11-21 (×7): qty 1

## 2021-11-21 MED ORDER — DEXMEDETOMIDINE (PRECEDEX) IN NS 20 MCG/5ML (4 MCG/ML) IV SYRINGE
PREFILLED_SYRINGE | INTRAVENOUS | Status: DC | PRN
  Administered 2021-11-21: 20 ug via INTRAVENOUS

## 2021-11-21 MED ORDER — PHENYLEPHRINE HCL-NACL 20-0.9 MG/250ML-% IV SOLN
INTRAVENOUS | Status: DC | PRN
  Administered 2021-11-21: 25 ug/min via INTRAVENOUS

## 2021-11-21 MED ORDER — PENTAFLUOROPROP-TETRAFLUOROETH EX AERO: 1.0000 "application " | INHALATION_SPRAY | CUTANEOUS | Status: DC | PRN

## 2021-11-21 MED ORDER — ALBUMIN HUMAN 25 % IV SOLN
INTRAVENOUS | Status: AC
  Filled 2021-11-21: qty 100

## 2021-11-21 MED ORDER — ANTICOAGULANT SODIUM CITRATE 4% (200MG/5ML) IV SOLN: 5.0000 mL | Status: DC | PRN

## 2021-11-21 MED ORDER — ZOLPIDEM TARTRATE 5 MG PO TABS
5.0000 mg | ORAL_TABLET | Freq: Once | ORAL | Status: AC
  Administered 2021-11-21: 5 mg via ORAL
  Filled 2021-11-21: qty 1

## 2021-11-21 MED ORDER — SODIUM CHLORIDE 0.9 % IV SOLN
2.0000 g | INTRAVENOUS | Status: DC
  Administered 2021-11-21: 2 g via INTRAVENOUS
  Filled 2021-11-21 (×2): qty 20

## 2021-11-21 MED ORDER — PHENYLEPHRINE 80 MCG/ML (10ML) SYRINGE FOR IV PUSH (FOR BLOOD PRESSURE SUPPORT)
PREFILLED_SYRINGE | INTRAVENOUS | Status: DC | PRN
  Administered 2021-11-21: 160 ug via INTRAVENOUS

## 2021-11-21 MED ORDER — LIDOCAINE HCL (PF) 1 % IJ SOLN: 5.0000 mL | INTRAMUSCULAR | Status: DC | PRN

## 2021-11-21 MED ORDER — ALTEPLASE 2 MG IJ SOLR
2.0000 mg | Freq: Once | INTRAMUSCULAR | Status: DC | PRN
Start: 2021-11-21 — End: 2021-12-04

## 2021-11-21 MED ORDER — PROPOFOL 10 MG/ML IV BOLUS
INTRAVENOUS | Status: DC | PRN
  Administered 2021-11-21: 30 mg via INTRAVENOUS

## 2021-11-21 MED ORDER — INSULIN DETEMIR 100 UNIT/ML ~~LOC~~ SOLN
16.0000 [IU] | Freq: Two times a day (BID) | SUBCUTANEOUS | Status: DC
  Administered 2021-11-21 – 2021-11-26 (×11): 16 [IU] via SUBCUTANEOUS
  Filled 2021-11-21 (×14): qty 0.16

## 2021-11-21 MED ORDER — SODIUM CHLORIDE 0.9 % IV SOLN: INTRAVENOUS | Status: DC | PRN

## 2021-11-21 MED ORDER — SODIUM CHLORIDE 0.45 % IV SOLN: INTRAVENOUS | Status: DC

## 2021-11-21 MED ORDER — PROPOFOL 500 MG/50ML IV EMUL
INTRAVENOUS | Status: DC | PRN
  Administered 2021-11-21: 30 ug/kg/min via INTRAVENOUS

## 2021-11-21 MED ORDER — LIDOCAINE-PRILOCAINE 2.5-2.5 % EX CREA: 1.0000 "application " | TOPICAL_CREAM | CUTANEOUS | Status: DC | PRN

## 2021-11-21 NOTE — Progress Notes (Signed)
Interventional Radiology Brief Note:  IR following for tunneled dialysis catheter placement.  WBC continues to trend upward, now 19.8.  Temp catheter was dislodged/removed.  Appears CCM planning to place new temp line to resume iHD.  IR continues to follow for tunneled catheter placement once medically improved.   Brynda Greathouse, MS RD PA-C

## 2021-11-21 NOTE — CV Procedure (Addendum)
Procedure: TEE  Sedation: Per anesthesiology  Indication: Atrial fibrillation  Findings: Please see echo section for full report. Normal LV size and wall motion, EF 55-60%.  Normal wall thickness.  There is septal bounce suggestive of recent cardiac surgery.  Normal RV size with mildly decreased systolic function.  Mild right atrial enlargement, prominent Chiari network.  There was a PFO present noted by color doppler and bubble study.  Mild left atrial enlargement.  No LA appendage thrombus.  Trivial TR, unable to measure RV-RA gradient.  Mild mitral regurgitation.  Trileaflet aortic valve with no significant stenosis or regurgitation.  Normal caliber thoracic aorta with mild plaque.  Small pericardial effusion primarily towards the apex.   May proceed to DCCV.   Loralie Champagne 11/21/2021 2:45 PM

## 2021-11-21 NOTE — Progress Notes (Signed)
  Echocardiogram Echocardiogram Transesophageal has been performed.  [name] [name] 11/21/2021, 3:00 PM

## 2021-11-21 NOTE — Plan of Care (Signed)

## 2021-11-21 NOTE — Procedures (Addendum)
Central Venous Catheter Insertion Procedure Note  [name] [name]  [id number]  09/13/1955  Date:11/21/21  Time:4:22 PM   Provider Performing:Charleigh Correnti   Procedure: Insertion of Non-tunneled Central Venous Catheter(36556)with US guidance (32951)    Indication(s) Hemodialysis  Consent Risks of the procedure as well as the alternatives and risks of each were explained to the patient and/or caregiver.  Consent for the procedure was obtained and is signed in the bedside chart  Anesthesia Topical only with 1% lidocaine   Timeout Verified patient identification, verified procedure, site/side was marked, verified correct patient position, special equipment/implants available, medications/allergies/relevant history reviewed, required imaging and test results available.  Sterile Technique Maximal sterile technique including full sterile barrier drape, hand hygiene, sterile gown, sterile gloves, mask, hair covering, sterile ultrasound probe cover (if used).  Procedure Description Area of catheter insertion was cleaned with chlorhexidine and draped in sterile fashion.   With real-time ultrasound guidance a HD catheter was placed into the left internal jugular vein.  Nonpulsatile blood flow and easy flushing noted in all ports.  The catheter was sutured in place and sterile dressing applied.  Complications/Tolerance None; patient tolerated the procedure well. Chest X-ray is ordered to verify placement for internal jugular or subclavian cannulation.  Chest x-ray is not ordered for femoral cannulation.  EBL Minimal  Specimen(s) None

## 2021-11-21 NOTE — Progress Notes (Signed)
eLink Physician-Brief Progress Note Patient Name: [name] [name] DOB: [birth year] MRN: [id number]   Date of Service  11/21/2021  HPI/Events of Note  Patient still not able to sleep even after Melatonin.   eICU Interventions  Plan: Ambien 5 mg PO X 1.     Intervention Category Major Interventions: Other:  Dempsy Damiano Cornelia Copa 11/21/2021, 1:02 AM

## 2021-11-21 NOTE — Progress Notes (Signed)
Patient ID: [name] [name], male   DOB: 24-Nov-1955, 66 y.o.   MRN: [id number] [facility] Progress Note   Assessment/ Plan:   1.  Acute kidney injury on chronic kidney disease stage IIIa:  -AKI likely ischemic ATN in the setting of septic versus cardiogenic shock.  Started on CRRT 5/26 for extracorporeal volume unloading after he had fixed urine output on Lasix drip that was inadequate for volume unloading. CRRT 5/26-5/31 -unable to receive [facility] given leukocytosis/infection. Plan today is to have temp line placed (appreciate assistance from PCCM), then DCCV, then IHD today. Will see how much UF he can get with HD today. If no significant UF then will plan for a sequential treatment tomorrow -no evidence of renal recovery at this junction, will need his HD catheter converted to [facility] down the road  2.  Coronary artery disease status post three-vessel CABG:  -on aspirin/statin. 3. Acute on chronic dCHF -UF as tolerated with HD, CVP still elevated per RN -milrinone stopped 5/29 4. Shock -possibly from sepsis. Abx/pressor support per primary service. Cxr suspicious for PNA -off pressors and on midodrine now 5.  Atrial fibrillation:  -Status post MAZE procedure at the time of CABG. Per primary service, on amio. -TEE-DCCV planned for today 6/1 6.  Anemia:  -Likely associated with recent surgery and postoperative inflammatory response.  transfuse PRN for hgb <7 -iron panel ordered to assess for ESA needs  Discussed w/ primary service   Subjective:   Patient seen and examined. Off crrt. CVP's back up to 18. Off CRRT, lost HD temp line yesterday. Temp HD cath to be placed by CCM this AM.  Off pressors.   Objective:   BP 116/68   Pulse 91   Temp 98.9 F (37.2 C) (Oral)   Resp (!) 21   Ht 5\' 10"  (1.778 m)   Wt 116.8 kg   SpO2 95%   BMI 36.96 kg/m   Intake/Output Summary (Last 24 hours) at 11/21/2021 0805 Last data filed at 11/21/2021 0700 Gross per 24 hour  Intake 1612.61 ml   Output 513 ml  Net 1099.61 ml   Weight change:   Physical Exam: Gen: NAD, in recliner CVS: irregular, S1 and S2 normal, clean midline sternotomy incision Resp: diminished air entry bibasilar, no w/r/r/c, normal WOB, on RA Abd: Soft, obese, nontender, bowel sounds normal Ext: trace to 1+ edema b/l LEs Neuro: awake, alert Dialysis access: none  Imaging: DG Chest Port 1 View  Result Date: 11/20/2021 CLINICAL DATA:  Post CABG EXAM: PORTABLE CHEST 1 VIEW COMPARISON:  11/19/2021 FINDINGS: Left IJ introducer now overlies the distal left IJ. Right subclavian line is unchanged. Persistent left basilar and suprahilar opacities. Stable cardiomegaly. No pneumothorax. Small left effusion. IMPRESSION: No significant change. Persistent left basilar in suprahilar opacities and small left effusion. Electronically Signed   By: Macy Mis M.D.   On: 11/20/2021 08:13   DG CHEST PORT 1 VIEW  Result Date: 11/19/2021 CLINICAL DATA:  Central line placement EXAM: PORTABLE CHEST 1 VIEW COMPARISON:  11/19/2021 FINDINGS: Interval placement of right subclavian central venous catheter with the tip at the cavoatrial junction. No pneumothorax Pre-existing right jugular sheath unchanged in position in the right innominate vein. Left jugular central venous catheter tip in the proximal SVC also unchanged Postop CABG with cardiac enlargement. Asymmetric airspace disease left upper lobe and left lower lobe unchanged. Small left effusion. Right lung remains clear. IMPRESSION: Right subclavian central venous catheter tip at the cavoatrial junction. No pneumothorax Asymmetric airspace  disease on the left is unchanged. Electronically Signed   By: Franchot Gallo M.D.   On: 11/19/2021 11:39    Labs: BMET Recent Labs  Lab 11/18/21 0408 11/18/21 1628 11/19/21 0429 11/19/21 1508 11/20/21 0409 11/20/21 1519 11/21/21 0501  NA 136 137 132* 134* 135 134* 134*  K 4.4 4.5 4.0 4.2 4.1 4.3 4.1  CL 102 103 99 99 100 101 99  CO2  24 26 23 23 22 22  21*  GLUCOSE 165* 183* 165* 179* 131* 220* 123*  BUN 26* 31* 28* 30* 28* 37* 52*  CREATININE 2.57* 2.76* 2.27* 2.33* 2.32* 3.21* 4.74*  CALCIUM 8.2* 7.8* 8.1* 8.1* 8.4* 8.4* 8.6*  PHOS 3.2 3.0 3.3 3.6 3.4 4.1 6.1*   CBC Recent Labs  Lab 11/18/21 0408 11/19/21 0429 11/20/21 0409 11/21/21 0501  WBC 12.8* 16.0* 17.1* 19.8*  HGB 8.1* 8.8* 9.1* 8.7*  HCT 25.9* 27.9* 29.3* 27.9*  MCV 96.3 95.9 96.1 95.5  PLT 190 218 246 269    Medications:     aspirin EC  325 mg Oral Daily   Or   aspirin  324 mg Per Tube Daily   bisacodyl  10 mg Rectal Q0600   Chlorhexidine Gluconate Cloth  6 each Topical Daily   Chlorhexidine Gluconate Cloth  6 each Topical Q0600   docusate sodium  200 mg Oral Daily   fluticasone furoate-vilanterol  1 puff Inhalation Daily   guaiFENesin  1,200 mg Oral BID   insulin aspart  0-24 Units Subcutaneous Q4H   insulin detemir  16 Units Subcutaneous Q12H   lactulose  10 g Oral Daily   lidocaine  2 patch Transdermal Q24H   metoCLOPramide (REGLAN) injection  10 mg Intravenous Q6H   midodrine  15 mg Oral TID WC   multivitamin  1 tablet Oral QHS   pantoprazole  40 mg Oral Daily   rosuvastatin  20 mg Oral Daily   sodium chloride flush  10-40 mL Intracatheter Q12H   sodium chloride flush  3 mL Intravenous Q12H   Gean Quint, MD [facility] 11/21/2021, 8:05 AM

## 2021-11-21 NOTE — Transfer of Care (Signed)
Immediate Anesthesia Transfer of Care Note  Patient: [name] [name]  Procedure(s) Performed: TRANSESOPHAGEAL ECHOCARDIOGRAM (TEE) CARDIOVERSION BUBBLE STUDY  Patient Location: PACU and Endoscopy Unit  Anesthesia Type:MAC  Level of Consciousness: awake, alert  and oriented  Airway & Oxygen Therapy: Patient Spontanous Breathing and Patient connected to face mask oxygen  Post-op Assessment: Report given to RN and Post -op Vital signs reviewed and stable  Post vital signs: Reviewed and stable  Last Vitals:  Vitals Value Taken Time  BP 97/45 @ 1459  Temp    Pulse 70 11/21/21 1459  Resp 18 11/21/21 1459  SpO2 98 % 11/21/21 1459  Vitals shown include unvalidated device data.  Last Pain:  Vitals:   11/21/21 1200  TempSrc:   PainSc: 0-No pain      Patients Stated Pain Goal: 2 (94/50/38 8828)  Complications: No notable events documented.

## 2021-11-21 NOTE — Progress Notes (Signed)
      [address]       [place] [address]             [phone]      POD # 9 CABG x 3, maze  BP (!) 110/99   Pulse 85   Temp 98 F (36.7 C) (Temporal)   Resp (!) 24   Ht 5\' 10"  (1.778 m)   Wt 116.8 kg   SpO2 92%   BMI 36.96 kg/m  CVP 11 Amiodarone @ 30   Intake/Output Summary (Last 24 hours) at 11/21/2021 1913 Last data filed at 11/21/2021 1700 Gross per 24 hour  Intake 1172.7 ml  Output 150 ml  Net 1022.7 ml   No PM labs  In SR at present Continue current care  [place] C. Roxan Hockey, MD [facility] [phone]

## 2021-11-21 NOTE — Interval H&P Note (Signed)
History and Physical Interval Note:  11/21/2021 2:19 PM  [name] [name]  has presented today for surgery, with the diagnosis of afib.  The various methods of treatment have been discussed with the patient and family. After consideration of risks, benefits and other options for treatment, the patient has consented to  Procedure(s): TRANSESOPHAGEAL ECHOCARDIOGRAM (TEE) (N/A) CARDIOVERSION (N/A) as a surgical intervention.  The patient's history has been reviewed, patient examined, no change in status, stable for surgery.  I have reviewed the patient's chart and labs.  Questions were answered to the patient's satisfaction.     Luccas Towell [employer]

## 2021-11-21 NOTE — Anesthesia Preprocedure Evaluation (Addendum)
Anesthesia Evaluation  Patient identified by MRN, date of birth, ID band Patient awake    Reviewed: Allergy & Precautions, NPO status , Patient's Chart, lab work & pertinent test results  Airway Mallampati: II  TM Distance: >3 FB Neck ROM: Full    Dental no notable dental hx. (+) Edentulous Lower, Edentulous Upper, Upper Dentures, Lower Dentures   Pulmonary sleep apnea and Continuous Positive Airway Pressure Ventilation , former smoker,    Pulmonary exam normal breath sounds clear to auscultation       Cardiovascular hypertension, + CAD, + CABG and +CHF  + dysrhythmias Atrial Fibrillation  Rhythm:Irregular Rate:Normal  11/13/21  Left ventricular ejection fraction, by estimation, is 50 to 55%. The  left ventricle has low normal function. Left ventricular endocardial  border not optimally defined to evaluate regional wall motion. Left  ventricular diastolic parameters are  indeterminate.  2. Right ventricular systolic function was not well visualized. The right  ventricular size is normal.  3. The mitral valve is degenerative. No evidence of mitral valve  regurgitation. No evidence of mitral stenosis.  4. The aortic valve has an indeterminant number of cusps. Aortic valve  regurgitation is not visualized. Aortic valve sclerosis/calcification is  present, without any evidence of aortic stenosis.  - Comparison LVEF 60-65% 04/12/18; 30% 08/11/14.    Neuro/Psych negative neurological ROS  negative psych ROS   GI/Hepatic negative GI ROS, Neg liver ROS,   Endo/Other  diabetes  Renal/GU Renal InsufficiencyRenal disease  negative genitourinary   Musculoskeletal negative musculoskeletal ROS (+)   Abdominal   Peds negative pediatric ROS (+)  Hematology  (+) Blood dyscrasia, anemia ,   Anesthesia Other Findings   Reproductive/Obstetrics negative OB ROS                           Anesthesia  Physical Anesthesia Plan  ASA: 3  Anesthesia Plan: MAC   Post-op Pain Management: Minimal or no pain anticipated   Induction: Intravenous  PONV Risk Score and Plan: 1 and Propofol infusion and Treatment may vary due to age or medical condition  Airway Management Planned: Simple Face Mask  Additional Equipment:   Intra-op Plan:   Post-operative Plan:   Informed Consent: I have reviewed the patients History and Physical, chart, labs and discussed the procedure including the risks, benefits and alternatives for the proposed anesthesia with the patient or authorized representative who has indicated his/her understanding and acceptance.     Dental advisory given  Plan Discussed with: CRNA and Surgeon  Anesthesia Plan Comments:         Anesthesia Quick Evaluation

## 2021-11-21 NOTE — Anesthesia Procedure Notes (Signed)
Procedure Name: MAC Date/Time: 11/21/2021 2:31 PM Performed by: Lowella Dell, CRNA Pre-anesthesia Checklist: Patient identified, Emergency Drugs available, Suction available, Patient being monitored and Timeout performed Patient Re-evaluated:Patient Re-evaluated prior to induction Oxygen Delivery Method: Nasal cannula Placement Confirmation: positive ETCO2 Dental Injury: Teeth and Oropharynx as per pre-operative assessment

## 2021-11-21 NOTE — Progress Notes (Signed)
Patient ID: [name] [name], male   DOB: March 08, 1956, 66 y.o.   MRN: [id number]     Advanced Heart Failure Rounding Note  PCP-Cardiologist: None   Subjective:    Started CVVHD 5/26.  Weight has trended down.  He lost his line yesterday, has been off CVVH.  CVP 18 today with minimal UOP. Co-ox 56%.  He is off NE, he remains on midodrine.  BP stable.   Abx started 5/25 for suspected septic shock.  Now off abx and afebrile, but WBCs up to 19.8.  Remains in AF, rate 90s at rest on amio gtt 30 mg/hr and heparin gtt.   Echo: Difficult study. LV EF 55-60%, RV probably normal, no pericardial effusion.    Objective:   Weight Range: 116.8 kg Body mass index is 36.96 kg/m.   Vital Signs:   Temp:  [98 F (36.7 C)-99.2 F (37.3 C)] 98.9 F (37.2 C) (06/01 0400) Pulse Rate:  [84-110] 91 (06/01 0700) Resp:  [14-34] 21 (06/01 0700) BP: (84-134)/(48-88) 116/68 (06/01 0700) SpO2:  [82 %-100 %] 95 % (06/01 0700) Weight:  [116.8 kg] 116.8 kg (06/01 0500) Last BM Date : 11/21/21  Weight change: Filed Weights   11/18/21 0500 11/19/21 0500 11/21/21 0500  Weight: 118.9 kg 116.6 kg 116.8 kg    Intake/Output:   Intake/Output Summary (Last 24 hours) at 11/21/2021 0733 Last data filed at 11/21/2021 0200 Gross per 24 hour  Intake 1733.33 ml  Output 720 ml  Net 1013.33 ml      Physical Exam   General: NAD Neck: JVP 14-16 cm, no thyromegaly or thyroid nodule.  Lungs: Clear to auscultation bilaterally with normal respiratory effort. CV: Nondisplaced PMI.  Heart irregular S1/S2, no S3/S4, no murmur.  1+ ankle edema.  Abdomen: Soft, nontender, no hepatosplenomegaly, no distention.  Skin: Intact without lesions or rashes.  Neurologic: Alert and oriented x 3.  Psych: Normal affect. Extremities: No clubbing or cyanosis.  HEENT: Normal.   Telemetry   Afib 90s (personally reviewed)   Labs    CBC Recent Labs    11/20/21 0409 11/21/21 0501  WBC 17.1* 19.8*  HGB 9.1* 8.7*  HCT 29.3*  27.9*  MCV 96.1 95.5  PLT 246 892   Basic Metabolic Panel Recent Labs    11/20/21 0409 11/20/21 1519 11/21/21 0501  NA 135 134* 134*  K 4.1 4.3 4.1  CL 100 101 99  CO2 22 22 21*  GLUCOSE 131* 220* 123*  BUN 28* 37* 52*  CREATININE 2.32* 3.21* 4.74*  CALCIUM 8.4* 8.4* 8.6*  MG 2.7*  --  2.8*  PHOS 3.4 4.1 6.1*   Liver Function Tests Recent Labs    11/20/21 1519 11/21/21 0501  ALBUMIN 3.3* 3.0*   No results for input(s): LIPASE, AMYLASE in the last 72 hours. Cardiac Enzymes No results for input(s): CKTOTAL, CKMB, CKMBINDEX, TROPONINI in the last 72 hours.  BNP: BNP (last 3 results) No results for input(s): BNP in the last 8760 hours.  ProBNP (last 3 results) No results for input(s): PROBNP in the last 8760 hours.   D-Dimer No results for input(s): DDIMER in the last 72 hours. Hemoglobin A1C No results for input(s): HGBA1C in the last 72 hours. Fasting Lipid Panel No results for input(s): CHOL, HDL, LDLCALC, TRIG, CHOLHDL, LDLDIRECT in the last 72 hours. Thyroid Function Tests No results for input(s): TSH, T4TOTAL, T3FREE, THYROIDAB in the last 72 hours.  Invalid input(s): FREET3  Other results:   Imaging    No results  found.   Medications:     Scheduled Medications:  aspirin EC  325 mg Oral Daily   Or   aspirin  324 mg Per Tube Daily   bisacodyl  10 mg Rectal Q0600   Chlorhexidine Gluconate Cloth  6 each Topical Daily   Chlorhexidine Gluconate Cloth  6 each Topical Q0600   docusate sodium  200 mg Oral Daily   fluticasone furoate-vilanterol  1 puff Inhalation Daily   guaiFENesin  1,200 mg Oral BID   insulin aspart  0-24 Units Subcutaneous Q4H   insulin detemir  16 Units Subcutaneous Q12H   lactulose  10 g Oral Daily   lidocaine  2 patch Transdermal Q24H   metoCLOPramide (REGLAN) injection  10 mg Intravenous Q6H   midodrine  15 mg Oral TID WC   multivitamin  1 tablet Oral QHS   pantoprazole  40 mg Oral Daily   rosuvastatin  20 mg Oral Daily    sodium chloride flush  10-40 mL Intracatheter Q12H   sodium chloride flush  3 mL Intravenous Q12H    Infusions:   prismasol BGK 4/2.5 400 mL/hr at 11/19/21 2213    prismasol BGK 4/2.5 400 mL/hr at 11/19/21 2221   sodium chloride Stopped (11/14/21 0549)   sodium chloride 10 mL/hr at 11/21/21 0648   sodium chloride     sodium chloride Stopped (11/19/21 2303)   sodium chloride     amiodarone 30 mg/hr (11/21/21 0351)   heparin 1,700 Units/hr (11/21/21 0200)   lactated ringers Stopped (11/18/21 2130)   lactated ringers Stopped (11/17/21 2054)   norepinephrine (LEVOPHED) Adult infusion Stopped (11/20/21 1701)   prismasol BGK 4/2.5 1,500 mL/hr at 11/20/21 1002   vasopressin Stopped (11/20/21 0813)    PRN Medications: sodium chloride, heparin, HYDROmorphone (DILAUDID) injection, ipratropium-albuterol, ondansetron (ZOFRAN) IV, oxyCODONE, sodium chloride, sodium chloride flush, sodium chloride flush    Patient Profile   66 y.o. male with history of CM with recovered EF, persistent atrial fibrillation, DM II, CKD, sick sinus syndrome, OSA, prior smoker. Underwent CABG X 3 and MAZE/PVI on 05/23>>post-op decrease in UOP and AKI with volume overload>>postcardiotomy shock requiring inotrope and pressor support  Assessment/Plan   1. Shock: Echo reviewed, LV EF 55-60%, RV poorly visualized but probably near-normal, no significant pericardial effusion. Suspect sepsis physiology with distributive shock.  Improved with broad spectrum abx. Lactate has cleared. Bcx 5/25 remain NGTD. Co-ox 56%, now off pressors.  - Continue midodrine to 15 mg TID - Completed course of vancomycin/cefepime.  2. Acute on chronic primarily diastolic CHF: LV EF 42-68%, RV poorly visualized but probably near-normal, no significant pericardial effusion.  Patient is still volume overloaded, suspect primarily right heart failure physiology.  CVVHD started on 5/26. Weight down but CVP still 18 today.  He is off CVVH after  losing line. Needs further volume off.  - Will replace HD line today, plan for regular HD run today.  3. AKI on CKD stage 3: Baseline SCr 1.3-1.5 .  Suspect due to shock/ATN as well as elevated renal venous pressure in setting of volume overload, ileus, abdominal distention. CVVHD started 5/26. Remains nearly anuric.  Lost HD line 5/31.  - Replace HD line today and will get regular HD run. Suspect he will need iHD.  - Renal following 4. CAD: S/p CABG. No chest pain.  - Continue ASA, statin.  5. Atrial fibrillation with RVR: Has had Maze and LA appendage clip at time of CABG on 11/12/21. He remains in AF, rate 90s at  rest up to 110s with activity - Continue heparin gtt - Continue amio gtt  30/hr - TEE-DCCV today, discussed risks/benefits with patient and he agrees to procedure.  6. DM2: SSI.  7. ID: WBCs 22>>16K > 14K -> 12.8 -> 16 -> 17 -> 19.8.  Afebrile.  Septic shock initially, resolved.  Blood cultures NGTD.  Treated for HCAP, completed vanc and cefepime.  Now with yeast in sputum, ?contaminant.  8. Ileus: NGT. Improving.  - Continue Reglan.  - Received Relastor 5/27   9. Pulmonary: Respiratory status improving with fluid removal and abx - HD for volume removal  - CCM following.    CRITICAL CARE Performed by: Loralie Champagne  Total critical care time: 35 minutes  Critical care time was exclusive of separately billable procedures and treating other patients.  Critical care was necessary to treat or prevent imminent or life-threatening deterioration.  Critical care was time spent personally by me on the following activities: development of treatment plan with patient and/or surrogate as well as nursing, discussions with consultants, evaluation of patient's response to treatment, examination of patient, obtaining history from patient or surrogate, ordering and performing treatments and interventions, ordering and review of laboratory studies, ordering and review of radiographic studies,  pulse oximetry and re-evaluation of patient's condition.  Length of Stay: 9  Loralie Champagne, MD  11/21/2021, 7:33 AM  Advanced Heart Failure Team Pager [phone] (M-F; 7a - 5p)  Please contact [facility] for night-coverage after hours (5p -7a ) and weekends on [email or web address]

## 2021-11-21 NOTE — Progress Notes (Signed)
Patient became short of breath and desaturation awaiting HD. Transition to V-60 BiPAP to provide oxygen and increased pressure. Patient breathing better and Sp 02 increased to 97-100%.

## 2021-11-21 NOTE — H&P (View-Only) (Signed)
Patient ID: [name] [name], male   DOB: [birth year], 66 y.o.   MRN: [id number]     Advanced Heart Failure Rounding Note  PCP-Cardiologist: None   Subjective:    Started CVVHD 5/26.  Weight has trended down.  He lost his line yesterday, has been off CVVH.  CVP 18 today with minimal UOP. Co-ox 56%.  He is off NE, he remains on midodrine.  BP stable.   Abx started 5/25 for suspected septic shock.  Now off abx and afebrile, but WBCs up to 19.8.  Remains in AF, rate 90s at rest on amio gtt 30 mg/hr and heparin gtt.   Echo: Difficult study. LV EF 55-60%, RV probably normal, no pericardial effusion.    Objective:   Weight Range: 116.8 kg Body mass index is 36.96 kg/m.   Vital Signs:   Temp:  [98 F (36.7 C)-99.2 F (37.3 C)] 98.9 F (37.2 C) (06/01 0400) Pulse Rate:  [84-110] 91 (06/01 0700) Resp:  [14-34] 21 (06/01 0700) BP: (84-134)/(48-88) 116/68 (06/01 0700) SpO2:  [82 %-100 %] 95 % (06/01 0700) Weight:  [116.8 kg] 116.8 kg (06/01 0500) Last BM Date : 11/21/21  Weight change: Filed Weights   11/18/21 0500 11/19/21 0500 11/21/21 0500  Weight: 118.9 kg 116.6 kg 116.8 kg    Intake/Output:   Intake/Output Summary (Last 24 hours) at 11/21/2021 0733 Last data filed at 11/21/2021 0200 Gross per 24 hour  Intake 1733.33 ml  Output 720 ml  Net 1013.33 ml      Physical Exam   General: NAD Neck: JVP 14-16 cm, no thyromegaly or thyroid nodule.  Lungs: Clear to auscultation bilaterally with normal respiratory effort. CV: Nondisplaced PMI.  Heart irregular S1/S2, no S3/S4, no murmur.  1+ ankle edema.  Abdomen: Soft, nontender, no hepatosplenomegaly, no distention.  Skin: Intact without lesions or rashes.  Neurologic: Alert and oriented x 3.  Psych: Normal affect. Extremities: No clubbing or cyanosis.  HEENT: Normal.   Telemetry   Afib 90s (personally reviewed)   Labs    CBC Recent Labs    11/20/21 0409 11/21/21 0501  WBC 17.1* 19.8*  HGB 9.1* 8.7*  HCT 29.3*  27.9*  MCV 96.1 95.5  PLT 246 756   Basic Metabolic Panel Recent Labs    11/20/21 0409 11/20/21 1519 11/21/21 0501  NA 135 134* 134*  K 4.1 4.3 4.1  CL 100 101 99  CO2 22 22 21*  GLUCOSE 131* 220* 123*  BUN 28* 37* 52*  CREATININE 2.32* 3.21* 4.74*  CALCIUM 8.4* 8.4* 8.6*  MG 2.7*  --  2.8*  PHOS 3.4 4.1 6.1*   Liver Function Tests Recent Labs    11/20/21 1519 11/21/21 0501  ALBUMIN 3.3* 3.0*   No results for input(s): LIPASE, AMYLASE in the last 72 hours. Cardiac Enzymes No results for input(s): CKTOTAL, CKMB, CKMBINDEX, TROPONINI in the last 72 hours.  BNP: BNP (last 3 results) No results for input(s): BNP in the last 8760 hours.  ProBNP (last 3 results) No results for input(s): PROBNP in the last 8760 hours.   D-Dimer No results for input(s): DDIMER in the last 72 hours. Hemoglobin A1C No results for input(s): HGBA1C in the last 72 hours. Fasting Lipid Panel No results for input(s): CHOL, HDL, LDLCALC, TRIG, CHOLHDL, LDLDIRECT in the last 72 hours. Thyroid Function Tests No results for input(s): TSH, T4TOTAL, T3FREE, THYROIDAB in the last 72 hours.  Invalid input(s): FREET3  Other results:   Imaging    No results  found.   Medications:     Scheduled Medications:  aspirin EC  325 mg Oral Daily   Or   aspirin  324 mg Per Tube Daily   bisacodyl  10 mg Rectal Q0600   Chlorhexidine Gluconate Cloth  6 each Topical Daily   Chlorhexidine Gluconate Cloth  6 each Topical Q0600   docusate sodium  200 mg Oral Daily   fluticasone furoate-vilanterol  1 puff Inhalation Daily   guaiFENesin  1,200 mg Oral BID   insulin aspart  0-24 Units Subcutaneous Q4H   insulin detemir  16 Units Subcutaneous Q12H   lactulose  10 g Oral Daily   lidocaine  2 patch Transdermal Q24H   metoCLOPramide (REGLAN) injection  10 mg Intravenous Q6H   midodrine  15 mg Oral TID WC   multivitamin  1 tablet Oral QHS   pantoprazole  40 mg Oral Daily   rosuvastatin  20 mg Oral Daily    sodium chloride flush  10-40 mL Intracatheter Q12H   sodium chloride flush  3 mL Intravenous Q12H    Infusions:   prismasol BGK 4/2.5 400 mL/hr at 11/19/21 2213    prismasol BGK 4/2.5 400 mL/hr at 11/19/21 2221   sodium chloride Stopped (11/14/21 0549)   sodium chloride 10 mL/hr at 11/21/21 0648   sodium chloride     sodium chloride Stopped (11/19/21 2303)   sodium chloride     amiodarone 30 mg/hr (11/21/21 0351)   heparin 1,700 Units/hr (11/21/21 0200)   lactated ringers Stopped (11/18/21 2130)   lactated ringers Stopped (11/17/21 2054)   norepinephrine (LEVOPHED) Adult infusion Stopped (11/20/21 1701)   prismasol BGK 4/2.5 1,500 mL/hr at 11/20/21 1002   vasopressin Stopped (11/20/21 0813)    PRN Medications: sodium chloride, heparin, HYDROmorphone (DILAUDID) injection, ipratropium-albuterol, ondansetron (ZOFRAN) IV, oxyCODONE, sodium chloride, sodium chloride flush, sodium chloride flush    Patient Profile   66 y.o. male with history of CM with recovered EF, persistent atrial fibrillation, DM II, CKD, sick sinus syndrome, OSA, prior smoker. Underwent CABG X 3 and MAZE/PVI on 05/23>>post-op decrease in UOP and AKI with volume overload>>postcardiotomy shock requiring inotrope and pressor support  Assessment/Plan   1. Shock: Echo reviewed, LV EF 55-60%, RV poorly visualized but probably near-normal, no significant pericardial effusion. Suspect sepsis physiology with distributive shock.  Improved with broad spectrum abx. Lactate has cleared. Bcx 5/25 remain NGTD. Co-ox 56%, now off pressors.  - Continue midodrine to 15 mg TID - Completed course of vancomycin/cefepime.  2. Acute on chronic primarily diastolic CHF: LV EF 09-81%, RV poorly visualized but probably near-normal, no significant pericardial effusion.  Patient is still volume overloaded, suspect primarily right heart failure physiology.  CVVHD started on 5/26. Weight down but CVP still 18 today.  He is off CVVH after  losing line. Needs further volume off.  - Will replace HD line today, plan for regular HD run today.  3. AKI on CKD stage 3: Baseline SCr 1.3-1.5 .  Suspect due to shock/ATN as well as elevated renal venous pressure in setting of volume overload, ileus, abdominal distention. CVVHD started 5/26. Remains nearly anuric.  Lost HD line 5/31.  - Replace HD line today and will get regular HD run. Suspect he will need iHD.  - Renal following 4. CAD: S/p CABG. No chest pain.  - Continue ASA, statin.  5. Atrial fibrillation with RVR: Has had Maze and LA appendage clip at time of CABG on 11/12/21. He remains in AF, rate 90s at  rest up to 110s with activity - Continue heparin gtt - Continue amio gtt  30/hr - TEE-DCCV today, discussed risks/benefits with patient and he agrees to procedure.  6. DM2: SSI.  7. ID: WBCs 22>>16K > 14K -> 12.8 -> 16 -> 17 -> 19.8.  Afebrile.  Septic shock initially, resolved.  Blood cultures NGTD.  Treated for HCAP, completed vanc and cefepime.  Now with yeast in sputum, ?contaminant.  8. Ileus: NGT. Improving.  - Continue Reglan.  - Received Relastor 5/27   9. Pulmonary: Respiratory status improving with fluid removal and abx - HD for volume removal  - CCM following.    CRITICAL CARE Performed by: Loralie Champagne  Total critical care time: 35 minutes  Critical care time was exclusive of separately billable procedures and treating other patients.  Critical care was necessary to treat or prevent imminent or life-threatening deterioration.  Critical care was time spent personally by me on the following activities: development of treatment plan with patient and/or surrogate as well as nursing, discussions with consultants, evaluation of patient's response to treatment, examination of patient, obtaining history from patient or surrogate, ordering and performing treatments and interventions, ordering and review of laboratory studies, ordering and review of radiographic studies,  pulse oximetry and re-evaluation of patient's condition.  Length of Stay: 9  Loralie Champagne, MD  11/21/2021, 7:33 AM  Advanced Heart Failure Team Pager [phone] (M-F; 7a - 5p)  Please contact [facility] for night-coverage after hours (5p -7a ) and weekends on [email or web address]

## 2021-11-21 NOTE — Procedures (Signed)
Electrical Cardioversion Procedure Note Terrell Ostrand [id number] 1955/06/29  Procedure: Electrical Cardioversion Indications:  Atrial Fibrillation  Procedure Details Consent: Risks of procedure as well as the alternatives and risks of each were explained to the (patient/caregiver).  Consent for procedure obtained. Time Out: Verified patient identification, verified procedure, site/side was marked, verified correct patient position, special equipment/implants available, medications/allergies/relevent history reviewed, required imaging and test results available.  Performed  Patient placed on cardiac monitor, pulse oximetry, supplemental oxygen as necessary.  Sedation given:  Propofol per anesthesiology Pacer pads placed anterior and posterior chest.  Cardioverted 1 time(s).  Cardioverted at [facility].  Evaluation Findings: Post procedure EKG shows: NSR Complications: None Patient did tolerate procedure well.   Loralie Champagne 11/21/2021, 2:45 PM

## 2021-11-21 NOTE — Progress Notes (Signed)
NAME:  [name] [name], MRN:  [id number], DOB:  1955/12/22, LOS: 9 ADMISSION DATE:  11/12/2021, CONSULTATION DATE:  11/13/2021 REFERRING MD:  Dr. Aundra Dubin , CHIEF COMPLAINT:  Respiratory distress    History of Present Illness:  [name] [name] is a 66 y.o. male with a PMH significant for but not limited to CAD now s/p CABG, systolic congestive heart failure, CKD, type 2 diabetes, HTN, A-fib chronically anticoagulated, and OSA who presented to the ED 5/23 for elective CABG with MAZE procedure Dr. Lawson Fiscal.  Patient was extubated per heart failure protocol with no incident by by afternoon of 5/24 PCCM was consulted for progressive increase work of breathing.   Pertinent  Medical History  CAD no s/p CABG, systolic congestive heart failure, CKD, type 2 diabetes, HTN, A-fib chronically anticoagulated, and OSA   Significant Hospital Events: Including procedures, antibiotic start and stop dates in addition to other pertinent events   5/23 underwent CABG with MAZE procedure with Dr. Lawson Fiscal 5/24 PCCM consulted for progressive increased work of breathing  5/25 Urine output remained low overnight along with soft BP which prompted addition of vaso and low dose bicarb drip  5/26 CRRT started  Interim History / Subjective:  Patient remains on room air Vasopressors were titrated off Stated feeling better today than yesterday  Objective   Blood pressure 116/68, pulse 97, temperature 97.9 F (36.6 C), temperature source Oral, resp. rate (!) 22, height 5\' 10"  (1.778 m), weight 116.8 kg, SpO2 93 %. CVP:  [9 mmHg-12 mmHg] 9 mmHg      Intake/Output Summary (Last 24 hours) at 11/21/2021 8127 Last data filed at 11/21/2021 0700 Gross per 24 hour  Intake 1539.48 ml  Output 267 ml  Net 1272.48 ml   Filed Weights   11/18/21 0500 11/19/21 0500 11/21/21 0500  Weight: 118.9 kg 116.6 kg 116.8 kg    Examination: Physical exam: General: Acute on chronically ill-appearing obese male, sitting in the  recliner HEENT: [place]/AT, eyes anicteric.  moist mucus membranes Neuro: Alert, awake following commands, following commands Chest: Coarse breath sounds, no wheezes or rhonchi Heart: Irregularly irregular, no murmurs or gallops Abdomen: Soft, obese, nontender, nondistended, bowel sounds present Skin: No rash  Resolved Hospital Problem list   Acute hypoxic respiratory failure Constipation Ileus  Assessment & Plan:  CAD S/P CABG x3 Underwent elective CABG with Dr. Lawson Fiscal 5/23 Continue aspirin and Crestor  Shock mixed cardiogenic and septic Acute on chronic diastolic CHF Patient completed antibiotic therapy with cefepime and vancomycin Since we stopped antibiotic his white count started trending up Respiratory culture is growing yeast and gram-positive rods We will start him on ceftriaxone to complete 5 days therapy Vasopressors remain off Continue midodrine to 15 mg 3 times daily Coox decreased to 56% today EF is 55-60% CRRT was stopped yesterday after HD catheter was dislodged Remain off milrinone for few days  Chronic atrial fibrillation with rapid ventricular response, status post maze procedure and LAA appendage clip Continue IV amiodarone Continue IV heparin infusion Plan for TEE/DCCV today  Obstructive sleep apnea Encourage CPAP at night   Poorly controlled diabetes type 2 Hemoglobin A1c is 9.3 Home medications include Glipizide, Metformin, and Farxiga  Continue Levemir and SSI with CBG goal 140-180   Oliguric AKI superimposed on CKD stage 3a due to ischemic ATN CRRT was stopped yesterday after HD catheter was dislodged He is not making any urine We will replace HD catheter today, patient will have intermittent hemodialysis Nephrology is following   Best Practice (right click  and "Reselect all SmartList Selections" daily)   Diet/type: cardiac diet DVT prophylaxis: heparin gtt GI prophylaxis: PPI Lines: Central line Foley:  Yes, and it is still needed Code  Status:  full code Last date of multidisciplinary goals of care discussion: Per primary      Jacky Kindle MD [facility] See Amion for pager If no response to pager, please call [phone] until 7pm After 7pm, Please call E-link [phone]

## 2021-11-21 NOTE — Progress Notes (Addendum)
[facility] DAILY ICU PROGRESS NOTE                   [address]            [place] [address]          [phone]   9 Days Post-Op Procedure(s) (LRB): CORONARY ARTERY BYPASS GRAFTING (CABG) x  3 USING LEFT INTERNAL MAMMARY ARTERY AND RIGHT GREATER SAPHENOUS VEIN (N/A) MAZE (N/A) TRANSESOPHAGEAL ECHOCARDIOGRAM (TEE) (N/A) ENDOVEIN HARVEST OF GREATER SAPHENOUS VEIN  Total Length of Stay:  LOS: 9 days   Subjective: Patient with difficulty sleeping. Melatonin did not help so given Ambien last night. Patient resting this am in chair.  Objective: Vital signs in last 24 hours: Temp:  [98 F (36.7 C)-99.2 F (37.3 C)] 98.9 F (37.2 C) (06/01 0400) Pulse Rate:  [84-110] 91 (06/01 0700) Cardiac Rhythm: Atrial fibrillation (06/01 0400) Resp:  [14-36] 21 (06/01 0700) BP: (79-134)/(40-112) 116/68 (06/01 0700) SpO2:  [82 %-100 %] 95 % (06/01 0700) Weight:  [116.8 kg] 116.8 kg (06/01 0500)  Filed Weights   11/18/21 0500 11/19/21 0500 11/21/21 0500  Weight: 118.9 kg 116.6 kg 116.8 kg    Weight change:    Hemodynamic parameters for last 24 hours: CVP:  [5 mmHg-12 mmHg] 9 mmHg  Intake/Output from previous day: 05/31 0701 - 06/01 0700 In: 1733.3 [P.O.:980; I.V.:680; IV Piggyback:73.3] Out: 720   Intake/Output this shift: No intake/output data recorded.  Current Meds: Scheduled Meds:  aspirin EC  325 mg Oral Daily   Or   aspirin  324 mg Per Tube Daily   bisacodyl  10 mg Rectal Q0600   Chlorhexidine Gluconate Cloth  6 each Topical Daily   Chlorhexidine Gluconate Cloth  6 each Topical Q0600   docusate sodium  200 mg Oral Daily   fluticasone furoate-vilanterol  1 puff Inhalation Daily   guaiFENesin  1,200 mg Oral BID   insulin aspart  0-24 Units Subcutaneous Q4H   insulin detemir  12 Units Subcutaneous Q12H   lactulose  10 g Oral Daily   lidocaine  2 patch Transdermal Q24H   metoCLOPramide (REGLAN) injection  10 mg Intravenous Q6H   midodrine  15 mg Oral TID  WC   multivitamin  1 tablet Oral QHS   pantoprazole  40 mg Oral Daily   rosuvastatin  20 mg Oral Daily   sodium chloride flush  10-40 mL Intracatheter Q12H   sodium chloride flush  3 mL Intravenous Q12H   Continuous Infusions:   prismasol BGK 4/2.5 400 mL/hr at 11/19/21 2213    prismasol BGK 4/2.5 400 mL/hr at 11/19/21 2221   sodium chloride Stopped (11/14/21 0549)   sodium chloride 10 mL/hr at 11/21/21 0648   sodium chloride     sodium chloride Stopped (11/19/21 2303)   sodium chloride     amiodarone 30 mg/hr (11/21/21 0351)   heparin 1,700 Units/hr (11/21/21 0200)   lactated ringers Stopped (11/18/21 2130)   lactated ringers Stopped (11/17/21 2054)   norepinephrine (LEVOPHED) Adult infusion Stopped (11/20/21 1701)   prismasol BGK 4/2.5 1,500 mL/hr at 11/20/21 1002   vasopressin Stopped (11/20/21 0813)   PRN Meds:.sodium chloride, heparin, HYDROmorphone (DILAUDID) injection, ipratropium-albuterol, ondansetron (ZOFRAN) IV, oxyCODONE, sodium chloride, sodium chloride flush, sodium chloride flush  General appearance: alert, cooperative, and no distress Neurologic: intact Heart: IRRR IRRR Lungs: Slightly diminished left basilar breath sounds Abdomen: Soft, obese, non tender, bowel sounds Extremities:No LE edema. Ecchymosis right thigh Wound: Sternal wound and  RLE wounds are clean and dry. Small skin tear left side of neck  Lab Results: CBC: Recent Labs    11/20/21 0409 11/21/21 0501  WBC 17.1* 19.8*  HGB 9.1* 8.7*  HCT 29.3* 27.9*  PLT 246 269    BMET:  Recent Labs    11/20/21 1519 11/21/21 0501  NA 134* 134*  K 4.3 4.1  CL 101 99  CO2 22 21*  GLUCOSE 220* 123*  BUN 37* 52*  CREATININE 3.21* 4.74*  CALCIUM 8.4* 8.6*     CMET: Lab Results  Component Value Date   WBC 19.8 (H) 11/21/2021   HGB 8.7 (L) 11/21/2021   HCT 27.9 (L) 11/21/2021   PLT 269 11/21/2021   GLUCOSE 123 (H) 11/21/2021   ALT 9 11/18/2021   AST 26 11/18/2021   NA 134 (L) 11/21/2021   K  4.1 11/21/2021   CL 99 11/21/2021   CREATININE 4.74 (H) 11/21/2021   BUN 52 (H) 11/21/2021   CO2 21 (L) 11/21/2021   INR 1.4 (H) 11/12/2021   HGBA1C 9.3 (H) 11/08/2021      PT/INR:  No results for input(s): LABPROT, INR in the last 72 hours.  Radiology: No results found.   Assessment/Plan: S/P Procedure(s) (LRB): CORONARY ARTERY BYPASS GRAFTING (CABG) x  3 USING LEFT INTERNAL MAMMARY ARTERY AND RIGHT GREATER SAPHENOUS VEIN (N/A) MAZE (N/A) TRANSESOPHAGEAL ECHOCARDIOGRAM (TEE) (N/A) ENDOVEIN HARVEST OF GREATER SAPHENOUS VEIN CV-History of persistent atrial fibrillation. A flutter/a fib post op.  On Amiodarone and heparin drips. Co ox this am up to 56.1;has been off Milrinone for several days. Also, on Midodrine 15 mg tid.  Likely DCCV prior to discharge. Pulmonary-History of OSA on CPAP. On 2 liters via HFNC. CXR this am appears stable (small let pleural effusion and left base atelectasis). Continue Breo Ellipta and Xopenex. Encourage incentive spirometer and flutter valve. 3. Acute on chronic CHF- CVP 9-10. Appreciate advanced heart failure's assistance 4. Expected post op blood loss anemia-H and H this am stable at 8.7 and 27.9 5. DM-CBGs 213/207/128. On Insulin ut will adjust for better glucose control. Pre op HGA1C 9.3. Will not restart Metformin or Glipizide yet as creatinine elevated. Will need close medical follow up after discharge 6. AKI on CKD (stage III)-Creatinine increased to 24.74. Nephrology following and arranging for CVVH. Will need TDC but with increasing WBC, will not have placed now. 7. Leukocytosis, afebrile-WBC slightly increased to 19,800.New central line placed.  Respiratory culture result pending;few yeast, rare gram positive rods, and abundant WBC on gram stain. Blood cultures show NGTD. No sign of wound infection. Will discuss with Dr. Prescott Gum 8. Physical deconditioning-home PT ordered 9. Patient still with wires but on Heparin drip. Will discuss with Dr.  Prescott Gum.   Donielle Liston Alba PA-C 11/21/2021 7:02 AM   DCC to nsr today TEE shows EF .55 Transition to HD - appreciate renal input DC heparin tomorrow  for a few hours and DC EPWs  patient examined and medical record reviewed,agree with above note. Dahlia Byes 11/21/2021

## 2021-11-21 NOTE — Progress Notes (Signed)
OT Cancellation Note  Patient Details Name: [name] [name] MRN: [id number] DOB: [birth year]   Cancelled Treatment:    Reason Eval/Treat Not Completed: Patient declined, no reason specified.  Patient stating no sleep last night... wanted to defer mobility until after attempted BM.    Dalicia Kisner D Samariah Hokenson 11/21/2021, 9:38 AM

## 2021-11-21 NOTE — Progress Notes (Signed)
[place] for IV heparin Indication: atrial fibrillation  No Known Allergies  Patient Measurements: Height: 5\' 10"  (177.8 cm) Weight: 116.8 kg (257 lb 9.6 oz) IBW/kg (Calculated) : 73 Heparin Dosing Weight: ~ 100 kg  Vital Signs: Temp: 97.9 F (36.6 C) (06/01 0814) Temp Source: Oral (06/01 0814) BP: 112/62 (06/01 1300) Pulse Rate: 93 (06/01 1300)  Labs: Recent Labs    11/19/21 0429 11/19/21 1508 11/20/21 0409 11/20/21 1519 11/21/21 0501  HGB 8.8*  --  9.1*  --  8.7*  HCT 27.9*  --  29.3*  --  27.9*  PLT 218  --  246  --  269  HEPARINUNFRC 0.49  --  0.36  --  0.48  CREATININE 2.27*   < > 2.32* 3.21* 4.74*   < > = values in this interval not displayed.     Estimated Creatinine Clearance: 19.4 mL/min (A) (by C-G formula based on SCr of 4.74 mg/dL (H)).   Medical History: Past Medical History:  Diagnosis Date   Atrial fibrillation ([place])    Benign essential HTN 07/30/2014   Bradycardia 01/25/2015   CHF (congestive heart failure) (HCC)    Chronic anticoagulation 01/74/9449   Chronic systolic CHF (congestive heart failure), NYHA class 3 ([place]) 08/30/2014   Overview:  Global ef 30%   CKD (chronic kidney disease)    Class 3 severe obesity in adult ([place]) 04/28/2017   Coronary artery disease    Diabetes mellitus without complication (HCC)    Diabetic neuropathy ([place]) 07/28/2014   Dysrhythmia    Erectile dysfunction    High risk medication use 05/26/2018   Hypertension    Hypertensive heart disease with heart failure ([place]) 07/30/2014   Lumbago    LV dysfunction 04/28/2017   Mixed hyperlipidemia 07/28/2014   OSA (obstructive sleep apnea)    CPAP   Paroxysmal atrial fibrillation ([place]) 07/28/2014   Sinus node dysfunction ([place]) 04/21/2016   Testicular hypofunction    Uncontrolled type 2 diabetes mellitus with microalbuminuric diabetic nephropathy 07/28/2014   Assessment: 66 yo male on chronic Xarelto for afib.  Now s/p CABG  with MAZE, and on CRRT.  Remains in afib.  Pharmacy asked to resume anticoagulation with IV heparin. Targeting lowering heparin level goal given recent OHS.  Heparin level remains therapeutic at 0.48, H/H and pltc stable.  Awaiting DCCV today.  Goal of Therapy:  Heparin level 0.3-0.5 Monitor platelets by anticoagulation protocol: Yes   Plan:  Continue heparin 1700 units/hr Daily heparin level and CBC  [place] Crane, Vena [place], BCPS, BCCP Clinical Pharmacist  11/21/2021 1:33 PM   [facility] pharmacy phone numbers are listed on [email or web address]

## 2021-11-22 ENCOUNTER — Inpatient Hospital Stay (HOSPITAL_COMMUNITY): Payer: Medicare HMO

## 2021-11-22 ENCOUNTER — Encounter (HOSPITAL_COMMUNITY): Payer: Self-pay | Admitting: Cardiology

## 2021-11-22 DIAGNOSIS — Z951 Presence of aortocoronary bypass graft: Secondary | ICD-10-CM | POA: Diagnosis not present

## 2021-11-22 DIAGNOSIS — I5033 Acute on chronic diastolic (congestive) heart failure: Secondary | ICD-10-CM | POA: Diagnosis not present

## 2021-11-22 LAB — BODY FLUID CELL COUNT WITH DIFFERENTIAL
Eos, Fluid: 0 %
Lymphs, Fluid: 39 %
Monocyte-Macrophage-Serous Fluid: 29 % — ABNORMAL LOW (ref 50–90)
Neutrophil Count, Fluid: 32 % — ABNORMAL HIGH (ref 0–25)
Total Nucleated Cell Count, Fluid: 707 cu mm (ref 0–1000)

## 2021-11-22 LAB — RENAL FUNCTION PANEL
Albumin: 2.9 g/dL — ABNORMAL LOW (ref 3.5–5.0)
Albumin: 2.6 g/dL — ABNORMAL LOW (ref 3.5–5.0)
Anion gap: 10 (ref 5–15)
Anion gap: 14 (ref 5–15)
BUN: 40 mg/dL — ABNORMAL HIGH (ref 8–23)
BUN: 50 mg/dL — ABNORMAL HIGH (ref 8–23)
CO2: 24 mmol/L (ref 22–32)
CO2: 19 mmol/L — ABNORMAL LOW (ref 22–32)
Calcium: 8.3 mg/dL — ABNORMAL LOW (ref 8.9–10.3)
Calcium: 7.5 mg/dL — ABNORMAL LOW (ref 8.9–10.3)
Chloride: 99 mmol/L (ref 98–111)
Chloride: 99 mmol/L (ref 98–111)
Creatinine, Ser: 4 mg/dL — ABNORMAL HIGH (ref 0.61–1.24)
Creatinine, Ser: 4.86 mg/dL — ABNORMAL HIGH (ref 0.61–1.24)
GFR, Estimated: 16 mL/min — ABNORMAL LOW (ref >60)
GFR, Estimated: 12 mL/min — ABNORMAL LOW (ref >60)
Glucose, Bld: 72 mg/dL (ref 70–99)
Glucose, Bld: 278 mg/dL — ABNORMAL HIGH (ref 70–99)
Phosphorus: 5.2 mg/dL — ABNORMAL HIGH (ref 2.5–4.6)
Phosphorus: 6.1 mg/dL — ABNORMAL HIGH (ref 2.5–4.6)
Potassium: 3.7 mmol/L (ref 3.5–5.1)
Potassium: 4.3 mmol/L (ref 3.5–5.1)
Sodium: 133 mmol/L — ABNORMAL LOW (ref 135–145)
Sodium: 132 mmol/L — ABNORMAL LOW (ref 135–145)

## 2021-11-22 LAB — PROTEIN, PLEURAL OR PERITONEAL FLUID: Total protein, fluid: 3.4 g/dL

## 2021-11-22 LAB — GLUCOSE, CAPILLARY
Glucose-Capillary: 85 mg/dL (ref 70–99)
Glucose-Capillary: 97 mg/dL (ref 70–99)
Glucose-Capillary: 244 mg/dL — ABNORMAL HIGH (ref 70–99)
Glucose-Capillary: 304 mg/dL — ABNORMAL HIGH (ref 70–99)
Glucose-Capillary: 247 mg/dL — ABNORMAL HIGH (ref 70–99)

## 2021-11-22 LAB — CBC
HCT: 26.7 % — ABNORMAL LOW (ref 39.0–52.0)
Hemoglobin: 8.4 g/dL — ABNORMAL LOW (ref 13.0–17.0)
MCH: 29.8 pg (ref 26.0–34.0)
MCHC: 31.5 g/dL (ref 30.0–36.0)
MCV: 94.7 fL (ref 80.0–100.0)
Platelets: 253 10*3/uL (ref 150–400)
RBC: 2.82 MIL/uL — ABNORMAL LOW (ref 4.22–5.81)
RDW: 15.3 % (ref 11.5–15.5)
WBC: 22.2 10*3/uL — ABNORMAL HIGH (ref 4.0–10.5)
nRBC: 0 % (ref 0.0–0.2)

## 2021-11-22 LAB — IRON AND TIBC
Iron: 21 ug/dL — ABNORMAL LOW (ref 45–182)
Saturation Ratios: 8 % — ABNORMAL LOW (ref 17.9–39.5)
TIBC: 279 ug/dL (ref 250–450)
UIBC: 258 ug/dL

## 2021-11-22 LAB — GLUCOSE, PLEURAL OR PERITONEAL FLUID: Glucose, Fluid: 233 mg/dL

## 2021-11-22 LAB — FERRITIN: Ferritin: 260 ng/mL (ref 24–336)

## 2021-11-22 LAB — ALBUMIN, PLEURAL OR PERITONEAL FLUID: Albumin, Fluid: 2 g/dL

## 2021-11-22 LAB — LACTATE DEHYDROGENASE, PLEURAL OR PERITONEAL FLUID: LD, Fluid: 544 U/L — ABNORMAL HIGH (ref 3–23)

## 2021-11-22 LAB — LACTATE DEHYDROGENASE: LDH: 203 U/L — ABNORMAL HIGH (ref 98–192)

## 2021-11-22 LAB — MAGNESIUM: Magnesium: 2.2 mg/dL (ref 1.7–2.4)

## 2021-11-22 LAB — PROTEIN, TOTAL: Total Protein: 6.6 g/dL (ref 6.5–8.1)

## 2021-11-22 LAB — HEPARIN LEVEL (UNFRACTIONATED): Heparin Unfractionated: 0.65 IU/mL (ref 0.30–0.70)

## 2021-11-22 MED ORDER — VANCOMYCIN HCL IN DEXTROSE 1-5 GM/200ML-% IV SOLN
1000.0000 mg | Freq: Once | INTRAVENOUS | Status: AC
  Administered 2021-11-23: 1000 mg via INTRAVENOUS
  Filled 2021-11-22: qty 200

## 2021-11-22 MED ORDER — METOCLOPRAMIDE HCL 5 MG/ML IJ SOLN
5.0000 mg | Freq: Three times a day (TID) | INTRAMUSCULAR | Status: DC
  Administered 2021-11-22 – 2021-12-02 (×28): 5 mg via INTRAVENOUS
  Filled 2021-11-22 (×28): qty 2

## 2021-11-22 MED ORDER — ALBUMIN HUMAN 25 % IV SOLN
12.5000 g | Freq: Once | INTRAVENOUS | Status: AC
Start: 2021-11-23 — End: 2021-11-22
  Administered 2021-11-22: 12.5 g via INTRAVENOUS

## 2021-11-22 MED ORDER — ALBUMIN HUMAN 25 % IV SOLN
INTRAVENOUS | Status: AC
  Administered 2021-11-22: 12.5 g
  Filled 2021-11-22: qty 100

## 2021-11-22 MED ORDER — VANCOMYCIN HCL 1250 MG/250ML IV SOLN: 1250.0000 mg | INTRAVENOUS | Status: DC

## 2021-11-22 MED ORDER — SODIUM CHLORIDE 0.9 % IV SOLN
2.0000 g | Freq: Two times a day (BID) | INTRAVENOUS | Status: DC
  Administered 2021-11-22: 2 g via INTRAVENOUS
  Filled 2021-11-22: qty 12.5

## 2021-11-22 MED ORDER — VANCOMYCIN HCL 1750 MG/350ML IV SOLN
1750.0000 mg | Freq: Once | INTRAVENOUS | Status: AC
  Administered 2021-11-22: 1750 mg via INTRAVENOUS
  Filled 2021-11-22: qty 350

## 2021-11-22 MED ORDER — SODIUM CHLORIDE 0.9 % IV SOLN
1.0000 g | INTRAVENOUS | Status: DC
  Administered 2021-11-23 – 2021-11-26 (×4): 1 g via INTRAVENOUS
  Filled 2021-11-22 (×4): qty 10

## 2021-11-22 MED ORDER — ALBUMIN HUMAN 5 % IV SOLN: 12.5000 g | Freq: Once | INTRAVENOUS | Status: DC

## 2021-11-22 MED ORDER — SODIUM CHLORIDE 0.9 % IV SOLN
150.0000 mg | INTRAVENOUS | Status: DC
  Administered 2021-11-22 – 2021-11-24 (×3): 150 mg via INTRAVENOUS
  Filled 2021-11-22: qty 15
  Filled 2021-11-22: qty 7.5
  Filled 2021-11-22: qty 15
  Filled 2021-11-22: qty 7.5
  Filled 2021-11-22: qty 15
  Filled 2021-11-22: qty 7.5

## 2021-11-22 NOTE — Patient Outreach (Signed)
Received a hospital referral for [name].. I have assigned [name] Lair, NP to call for follow up and determine if there are any Case Management needs.    [name] [name], [place], [place] Management [phone]

## 2021-11-22 NOTE — Progress Notes (Signed)
Patient ID: [name] [name], male   DOB: 1955/10/25, 66 y.o.   MRN: [id number] [name] Progress Note   Assessment/ Plan:   1.  Acute kidney injury on chronic kidney disease stage IIIa:  -AKI likely ischemic ATN in the setting of septic versus cardiogenic shock.  Started on CRRT 5/26 for extracorporeal volume unloading after he had fixed urine output on Lasix drip that was inadequate for volume unloading. CRRT 5/26-5/31 -unable to receive [facility] given leukocytosis/infection. New temp line placed 6/1 (previous line dislodged). First HD 6/1, will plan for sequential treatment given that he is still volume overloaded.  Next treatment tentatively planned for tomorrow. HD w/ midodrine support -no evidence of renal recovery at this junction, will need his HD catheter converted to [facility] down the road  2.  Coronary artery disease status post three-vessel CABG:  -on aspirin/statin. 3. Acute on chronic dCHF -UF as tolerated with HD, still volume overloaded. Will plan for a sequential treatment today for UF only -milrinone stopped 5/29 4. Shock -possibly from sepsis. Abx/pressor support per primary service. Cxr suspicious for PNA -off pressors and on midodrine now 5.  Atrial fibrillation:  -Status post MAZE procedure at the time of CABG. Per primary service, on amio. S/p tee-dccv 6/1 -planning for another cardioversion once more volume has been removed 6.  Anemia:  -Likely associated with recent surgery and postoperative inflammatory response.  transfuse PRN for hgb <7 -iron panel ordered to assess for ESA needs, will likely avoid IV Fe given infection  Discussed w/ primary service   Subjective:   Patient seen and examined. S/p TEE DCCV yesterday which was successful however converted back to afib. Tolerated HD yesterday, net uf only around 1.9L. Still SOB w/ movt.  CVP 15 this AM   Objective:   BP (!) 89/52   Pulse 98   Temp 99 F (37.2 C) (Axillary)   Resp (!) 27   Ht 5\' 10"   (1.778 m)   Wt 122 kg   SpO2 93%   BMI 38.59 kg/m   Intake/Output Summary (Last 24 hours) at 11/22/2021 0856 Last data filed at 11/22/2021 0700 Gross per 24 hour  Intake 1101.92 ml  Output 2150 ml  Net -1048.08 ml   Weight change: 5.153 kg  Physical Exam: Gen: NAD, in recliner CVS: irregular, S1 and S2 normal, clean midline sternotomy incision Resp: diminished air entry bibasilar, no w/r/r/c, normal WOB, on RA Abd: Soft, obese, nontender, bowel sounds normal Ext: trace to 1+ edema b/l LEs Neuro: awake, alert Dialysis access: none  Imaging: DG CHEST PORT 1 VIEW  Result Date: 11/22/2021 CLINICAL DATA:  Pleural effusion. EXAM: PORTABLE CHEST 1 VIEW COMPARISON:  November 21, 2021. FINDINGS: Stable cardiomegaly. Right-sided PICC line and left internal jugular catheter unchanged in position. Right lung is clear. Stable left basilar opacity is noted suggesting atelectasis and probable associated pleural effusion. Bony thorax is unremarkable. IMPRESSION: Stable left basilar opacity is noted suggesting atelectasis and probable associated pleural effusion. Electronically Signed   By: [name] [name] M.D.   On: 11/22/2021 07:59   DG CHEST PORT 1 VIEW  Result Date: 11/21/2021 CLINICAL DATA:  Shortness of breath chest pain. EXAM: PORTABLE CHEST 1 VIEW COMPARISON:  Chest radiograph performed earlier on the same day FINDINGS: The heart is enlarged. Evidence of prior coronary artery bypass grafting. Left IJ access catheter with distal tip in the SVC. Right access PICC with distal tip in the SVC. Mild elevation of the left hemidiaphragm with left basilar  opacity which may represent small effusion/atelectasis, unchanged. IMPRESSION: Interval placement of left IJ access central line. Stable cardiomegaly with left basilar opacity suggesting effusion/atelectasis, unchanged. Electronically Signed   By: [name] [name] D.O.   On: 11/21/2021 16:32   DG CHEST PORT 1 VIEW  Result Date: 11/21/2021 CLINICAL DATA:  Pleural  effusion EXAM: PORTABLE CHEST 1 VIEW COMPARISON:  11/20/2021 FINDINGS: Left IJ introducer has been removed. Right subclavian line is unchanged. Lung aeration is similar with persistent left upper and lower lung opacities. Probable small left pleural effusion remains present. Stable cardiomegaly. IMPRESSION: Lines and tubes as above. Persistent left upper and lower lung opacities and probable small left pleural effusion. Electronically Signed   By: [name] [name] M.D.   On: 11/21/2021 08:06   ECHO TEE  Result Date: 11/21/2021    TRANSESOPHOGEAL ECHO REPORT   Patient Name:   [name] [name] Date of Exam: 11/21/2021 Medical Rec #:  [id number]    Height:       70.0 in Accession #:    [id number]   Weight:       257.6 lb Date of Birth:  08-Aug-1955   BSA:          2.324 m Patient Age:    85 years     BP:           97/60 mmHg Patient Gender: M            HR:           83 bpm. Exam Location:  Inpatient Procedure: Transesophageal Echo, Color Doppler and Saline Contrast Bubble Study Indications:     I48.91* Unspeicified atrial fibrillation  History:         Patient has prior history of Echocardiogram examinations, most                  recent 11/13/2021. CHF, CAD, Arrythmias:Bradycardia and Atrial                  Fibrillation; Risk Factors:Dyslipidemia, Hypertension and                  Diabetes.  Sonographer:     [name] [name] RDCS Referring Phys:  [name] [name] Diagnosing Phys: [name] [name] PROCEDURE: After discussion of the risks and benefits of a TEE, an informed consent was obtained from the patient. The transesophogeal probe was passed without difficulty through the esophogus of the patient. Sedation performed by different physician. The patient was monitored while under deep sedation. Anesthestetic sedation was provided intravenously by Anesthesiology: 56.28mg  of Propofol. The patient's vital signs; including heart rate, blood pressure, and oxygen saturation; remained stable throughout the procedure. The  patient developed no complications during the procedure. A successful direct current cardioversion was performed at 200 joules with 1 attempt. IMPRESSIONS  1. Left ventricular ejection fraction, by estimation, is 55 to 60%. The left ventricle has normal function. The left ventricle has no regional wall motion abnormalities but there is septal bounce suggestive of prior cardiac surgery.  2. Right ventricular systolic function is mildly reduced. The right ventricular size is normal. Tricuspid regurgitation signal is inadequate for assessing PA pressure.  3. Left atrial size was mildly dilated. No left atrial/left atrial appendage thrombus was detected.  4. There is a PFO by color doppler and bubble study.  5. The mitral valve is normal in structure. Mild mitral valve regurgitation. No evidence of mitral stenosis.  6. The aortic valve is tricuspid. There is mild calcification of the  aortic valve. Aortic valve regurgitation is not visualized. No aortic stenosis is present.  7. A small pericardial effusion is present, primarily towards the apex. FINDINGS  Left Ventricle: Left ventricular ejection fraction, by estimation, is 55 to 60%. The left ventricle has normal function. The left ventricle has no regional wall motion abnormalities. The left ventricular internal cavity size was normal in size. There is  no left ventricular hypertrophy. Right Ventricle: The right ventricular size is normal. No increase in right ventricular wall thickness. Right ventricular systolic function is mildly reduced. Tricuspid regurgitation signal is inadequate for assessing PA pressure. Left Atrium: Left atrial size was mildly dilated. No left atrial/left atrial appendage thrombus was detected. Right Atrium: Right atrial size was normal in size. Pericardium: A small pericardial effusion is present. Mitral Valve: The mitral valve is normal in structure. Mild mitral valve regurgitation. No evidence of mitral valve stenosis. Tricuspid Valve: The  tricuspid valve is normal in structure. Tricuspid valve regurgitation is trivial. Aortic Valve: The aortic valve is tricuspid. There is mild calcification of the aortic valve. Aortic valve regurgitation is not visualized. No aortic stenosis is present. Pulmonic Valve: The pulmonic valve was normal in structure. Pulmonic valve regurgitation is not visualized. Aorta: The aortic root is normal in size and structure. IAS/Shunts: There is a PFO by color doppler and bubble study. Agitated saline contrast was given intravenously to evaluate for intracardiac shunting. Dalton McleanMD Electronically signed by [name] [name] Signature Date/Time: 11/21/2021/3:32:50 PM    Final     Labs: BMET Recent Labs  Lab 11/19/21 8185 11/19/21 1508 11/20/21 0409 11/20/21 1519 11/21/21 0501 11/21/21 1848 11/22/21 0455  NA 132* 134* 135 134* 134* 130* 133*  K 4.0 4.2 4.1 4.3 4.1 4.4 3.7  CL 99 99 100 101 99 96* 99  CO2 23 23 22 22  21* 17* 24  GLUCOSE 165* 179* 131* 220* 123* 279* 72  BUN 28* 30* 28* 37* 52* 62* 40*  CREATININE 2.27* 2.33* 2.32* 3.21* 4.74* 5.54* 4.00*  CALCIUM 8.1* 8.1* 8.4* 8.4* 8.6* 8.1* 8.3*  PHOS 3.3 3.6 3.4 4.1 6.1* 7.3* 5.2*   CBC Recent Labs  Lab 11/19/21 0429 11/20/21 0409 11/21/21 0501 11/22/21 0455  WBC 16.0* 17.1* 19.8* 22.2*  HGB 8.8* 9.1* 8.7* 8.4*  HCT 27.9* 29.3* 27.9* 26.7*  MCV 95.9 96.1 95.5 94.7  PLT 218 246 269 253    Medications:     aspirin EC  325 mg Oral Daily   Or   aspirin  324 mg Per Tube Daily   bisacodyl  10 mg Rectal Q0600   Chlorhexidine Gluconate Cloth  6 each Topical Daily   Chlorhexidine Gluconate Cloth  6 each Topical Q0600   docusate sodium  200 mg Oral Daily   fluticasone furoate-vilanterol  1 puff Inhalation Daily   guaiFENesin  1,200 mg Oral BID   insulin aspart  0-24 Units Subcutaneous Q4H   insulin detemir  16 Units Subcutaneous Q12H   lactulose  10 g Oral Daily   lidocaine  2 patch Transdermal Q24H   metoCLOPramide (REGLAN) injection   10 mg Intravenous Q6H   midodrine  15 mg Oral TID WC   multivitamin  1 tablet Oral QHS   pantoprazole  40 mg Oral Daily   rosuvastatin  20 mg Oral Daily   sodium chloride flush  3 mL Intravenous Q12H   Gean Quint, MD [facility] 11/22/2021, 8:56 AM

## 2021-11-22 NOTE — Progress Notes (Signed)
Received patient in bed, alert and oriented. Informed consent signed and in chart.  Time tx initiated:2313  Pre HD weight:116.8kg  Pre HD VS:117/59, 74, 14, 97.7, 100%  Time tx completed:0230  HD treatment completed. Patient tolerated well. HD catheter without signs and symptoms of complications. Patient transported back to the room, alert and orient and in no acute distress. Report given to bedside RN.  Total UF removed:1964ml  Medication given: Ailene Rud, RN Post HD VS:  Post HD weight: 114.8

## 2021-11-22 NOTE — Progress Notes (Signed)
Attempted x 2 to get pt out of bed for wt and walk. Pt is refusing.

## 2021-11-22 NOTE — Progress Notes (Addendum)
Pharmacy Antibiotic Note  [name] [name] is a 66 y.o. male s/p CABG with HF s/p CRRT > iHD s/p session 1 last pm and repeat planned today.  He is on rocephin for PNA and WBC trending up. Pharmacy has been consulted for cefepime and vancomycin dosing (micafungin has also been added) -WBC= 22.2 -respiratory cultures with GPR and yeast -Last vancomycin dose was 1250mg  on 5/28  Plan: -Cefepime 2gm IV x1 then 1gm daily -Vancomycin 1750mg  x1 then follow up timing and tolerability of HD for re-dose -Will follow  cultures and clinical progress   Height: 5\' 10"  (177.8 cm) Weight: 122 kg (268 lb 15.4 oz) IBW/kg (Calculated) : 73  Temp (24hrs), Avg:97.9 F (36.6 C), Min:97.3 F (36.3 C), Max:99 F (37.2 C)  Recent Labs  Lab 11/15/21 2357 11/16/21 0418 11/16/21 1600 11/18/21 0408 11/18/21 1628 11/19/21 0429 11/19/21 1508 11/20/21 0409 11/20/21 1519 11/21/21 0501 11/21/21 1848 11/22/21 0455  WBC  --  14.5*   < > 12.8*  --  16.0*  --  17.1*  --  19.8*  --  22.2*  CREATININE  --  3.53*   < > 2.57*   < > 2.27*   < > 2.32* 3.21* 4.74* 5.54* 4.00*  LATICACIDVEN 1.5  --   --   --   --  1.3  --   --   --   --   --   --   VANCORANDOM  --  17  --   --   --   --   --   --   --   --   --   --    < > = values in this interval not displayed.    Estimated Creatinine Clearance: 23.5 mL/min (A) (by C-G formula based on SCr of 4 mg/dL (H)).    No Known Allergies  Antimicrobials this admission: 5/23-5/25 periop cefazolin  Cefepime 5/25>> 5/31 Vancomycin 5/23>> 5/29 Ceftriaxone 6/1 > 6/2 Cefepime 6/2> Vancomycin 6/2> Micafungin 6/2>   [name] [name] Pharm.D. CPP, BCPS Clinical Pharmacist [phone] 11/22/2021 5:02 PM   **Pharmacist phone directory can now be found on [email or web address]).  Listed under [place].

## 2021-11-22 NOTE — Progress Notes (Signed)
Patient ID: [name] [name], male   DOB: 08-21-1955, 66 y.o.   MRN: [id number]  TCTS Evening Rounds:  Hemodynamically stable in sinus rhythm on IV amio. CVP 12  Sats 96%  To have HD again today.

## 2021-11-22 NOTE — Progress Notes (Addendum)
Patient ID: [name] [name], male   DOB: 1955-12-02, 66 y.o.   MRN: [id number]     Advanced Heart Failure Rounding Note  PCP-Cardiologist: None   Subjective:    Started CVVHD 05/26. Lost line 05/31. HD cath replaced 06/01.   1.9L removed yesterday with iHD.  Abx started 5/25 for suspected septic shock.  Abx completed and afebrile, but WBCs up, 17>>19.8>>22.2. CCM added rocephin 06/01 for possible aspiration. Broadening abx today and adding antifungal (yeast in resp culture).   S/p TEE/DCCV to SR 06/01. Back in AF with rates 100s this am.  Feels okay. No dyspnea at rest. Buttocks and back sore sitting in chair.   Echo: Difficult study. LV EF 55-60%, RV probably normal, no pericardial effusion.    Objective:   Weight Range: 122 kg Body mass index is 38.59 kg/m.   Vital Signs:   Temp:  [97.3 F (36.3 C)-99 F (37.2 C)] 99 F (37.2 C) (06/02 0730) Pulse Rate:  [70-100] 100 (06/02 0716) Resp:  [12-28] 19 (06/02 0716) BP: (91-131)/(45-99) 96/61 (06/02 0530) SpO2:  [83 %-100 %] 96 % (06/02 0716) FiO2 (%):  [50 %] 50 % (06/02 0100) Weight:  [122 kg] 122 kg (06/02 0500) Last BM Date : 11/21/21  Weight change: Filed Weights   11/19/21 0500 11/21/21 0500 11/22/21 0500  Weight: 116.6 kg 116.8 kg 122 kg    Intake/Output:   Intake/Output Summary (Last 24 hours) at 11/22/2021 0747 Last data filed at 11/22/2021 0700 Gross per 24 hour  Intake 1145.63 ml  Output 2150 ml  Net -1004.37 ml      Physical Exam   General:  No distress. Sitting up in chair. HEENT: normal Neck: supple. JVP 12-14. Carotids 2+ bilat; no bruits.  Cor: PMI nondisplaced. Irregular rate & rhythm. No rubs, gallops or murmurs. Lungs: Diminished left base. Abdomen: obese, soft, nontender, nondistended.  Extremities: no cyanosis, clubbing, rash, 1+ edema Neuro: alert & orientedx3, cranial nerves grossly intact. moves all 4 extremities w/o difficulty. Affect pleasant   Telemetry   Afib 100s (personally  reviewed)   Labs    CBC Recent Labs    11/21/21 0501 11/22/21 0455  WBC 19.8* 22.2*  HGB 8.7* 8.4*  HCT 27.9* 26.7*  MCV 95.5 94.7  PLT 269 631   Basic Metabolic Panel Recent Labs    11/21/21 0501 11/21/21 1848 11/22/21 0455  NA 134* 130* 133*  K 4.1 4.4 3.7  CL 99 96* 99  CO2 21* 17* 24  GLUCOSE 123* 279* 72  BUN 52* 62* 40*  CREATININE 4.74* 5.54* 4.00*  CALCIUM 8.6* 8.1* 8.3*  MG 2.8*  --  2.2  PHOS 6.1* 7.3* 5.2*   Liver Function Tests Recent Labs    11/21/21 1848 11/22/21 0455  ALBUMIN 2.9* 2.9*   No results for input(s): LIPASE, AMYLASE in the last 72 hours. Cardiac Enzymes No results for input(s): CKTOTAL, CKMB, CKMBINDEX, TROPONINI in the last 72 hours.  BNP: BNP (last 3 results) No results for input(s): BNP in the last 8760 hours.  ProBNP (last 3 results) No results for input(s): PROBNP in the last 8760 hours.   D-Dimer No results for input(s): DDIMER in the last 72 hours. Hemoglobin A1C No results for input(s): HGBA1C in the last 72 hours. Fasting Lipid Panel No results for input(s): CHOL, HDL, LDLCALC, TRIG, CHOLHDL, LDLDIRECT in the last 72 hours. Thyroid Function Tests No results for input(s): TSH, T4TOTAL, T3FREE, THYROIDAB in the last 72 hours.  Invalid input(s): FREET3  Other  results:   Imaging    DG CHEST PORT 1 VIEW  Result Date: 11/21/2021 CLINICAL DATA:  Shortness of breath chest pain. EXAM: PORTABLE CHEST 1 VIEW COMPARISON:  Chest radiograph performed earlier on the same day FINDINGS: The heart is enlarged. Evidence of prior coronary artery bypass grafting. Left IJ access catheter with distal tip in the SVC. Right access PICC with distal tip in the SVC. Mild elevation of the left hemidiaphragm with left basilar opacity which may represent small effusion/atelectasis, unchanged. IMPRESSION: Interval placement of left IJ access central line. Stable cardiomegaly with left basilar opacity suggesting effusion/atelectasis, unchanged.  Electronically Signed   By: Keane Police D.O.   On: 11/21/2021 16:32   ECHO TEE  Result Date: 11/21/2021    TRANSESOPHOGEAL ECHO REPORT   Patient Name:   [name] [name] Date of Exam: 11/21/2021 Medical Rec #:  [id number]    Height:       70.0 in Accession #:    [id number]   Weight:       257.6 lb Date of Birth:  1955/07/12   BSA:          2.324 m Patient Age:    41 years     BP:           97/60 mmHg Patient Gender: M            HR:           83 bpm. Exam Location:  Inpatient Procedure: Transesophageal Echo, Color Doppler and Saline Contrast Bubble Study Indications:     I48.91* Unspeicified atrial fibrillation  History:         Patient has prior history of Echocardiogram examinations, most                  recent 11/13/2021. CHF, CAD, Arrythmias:Bradycardia and Atrial                  Fibrillation; Risk Factors:Dyslipidemia, Hypertension and                  Diabetes.  Sonographer:     Bernadene Person RDCS Referring Phys:  [place] Diagnosing Phys: Franki Monte PROCEDURE: After discussion of the risks and benefits of a TEE, an informed consent was obtained from the patient. The transesophogeal probe was passed without difficulty through the esophogus of the patient. Sedation performed by different physician. The patient was monitored while under deep sedation. Anesthestetic sedation was provided intravenously by Anesthesiology: 56.28mg  of Propofol. The patient's vital signs; including heart rate, blood pressure, and oxygen saturation; remained stable throughout the procedure. The patient developed no complications during the procedure. A successful direct current cardioversion was performed at 200 joules with 1 attempt. IMPRESSIONS  1. Left ventricular ejection fraction, by estimation, is 55 to 60%. The left ventricle has normal function. The left ventricle has no regional wall motion abnormalities but there is septal bounce suggestive of prior cardiac surgery.  2. Right ventricular systolic  function is mildly reduced. The right ventricular size is normal. Tricuspid regurgitation signal is inadequate for assessing PA pressure.  3. Left atrial size was mildly dilated. No left atrial/left atrial appendage thrombus was detected.  4. There is a PFO by color doppler and bubble study.  5. The mitral valve is normal in structure. Mild mitral valve regurgitation. No evidence of mitral stenosis.  6. The aortic valve is tricuspid. There is mild calcification of the aortic valve. Aortic valve regurgitation is not visualized. No aortic stenosis  is present.  7. A small pericardial effusion is present, primarily towards the apex. FINDINGS  Left Ventricle: Left ventricular ejection fraction, by estimation, is 55 to 60%. The left ventricle has normal function. The left ventricle has no regional wall motion abnormalities. The left ventricular internal cavity size was normal in size. There is  no left ventricular hypertrophy. Right Ventricle: The right ventricular size is normal. No increase in right ventricular wall thickness. Right ventricular systolic function is mildly reduced. Tricuspid regurgitation signal is inadequate for assessing PA pressure. Left Atrium: Left atrial size was mildly dilated. No left atrial/left atrial appendage thrombus was detected. Right Atrium: Right atrial size was normal in size. Pericardium: A small pericardial effusion is present. Mitral Valve: The mitral valve is normal in structure. Mild mitral valve regurgitation. No evidence of mitral valve stenosis. Tricuspid Valve: The tricuspid valve is normal in structure. Tricuspid valve regurgitation is trivial. Aortic Valve: The aortic valve is tricuspid. There is mild calcification of the aortic valve. Aortic valve regurgitation is not visualized. No aortic stenosis is present. Pulmonic Valve: The pulmonic valve was normal in structure. Pulmonic valve regurgitation is not visualized. Aorta: The aortic root is normal in size and structure.  IAS/Shunts: There is a PFO by color doppler and bubble study. Agitated saline contrast was given intravenously to evaluate for intracardiac shunting. Keeana Pieratt [employer] Electronically signed by Franki Monte Signature Date/Time: 11/21/2021/3:32:50 PM    Final      Medications:     Scheduled Medications:  aspirin EC  325 mg Oral Daily   Or   aspirin  324 mg Per Tube Daily   bisacodyl  10 mg Rectal Q0600   Chlorhexidine Gluconate Cloth  6 each Topical Daily   Chlorhexidine Gluconate Cloth  6 each Topical Q0600   docusate sodium  200 mg Oral Daily   fluticasone furoate-vilanterol  1 puff Inhalation Daily   guaiFENesin  1,200 mg Oral BID   insulin aspart  0-24 Units Subcutaneous Q4H   insulin detemir  16 Units Subcutaneous Q12H   lactulose  10 g Oral Daily   lidocaine  2 patch Transdermal Q24H   metoCLOPramide (REGLAN) injection  10 mg Intravenous Q6H   midodrine  15 mg Oral TID WC   multivitamin  1 tablet Oral QHS   pantoprazole  40 mg Oral Daily   rosuvastatin  20 mg Oral Daily   sodium chloride flush  10-40 mL Intracatheter Q12H   sodium chloride flush  3 mL Intravenous Q12H    Infusions:   prismasol BGK 4/2.5 400 mL/hr at 11/19/21 2213    prismasol BGK 4/2.5 400 mL/hr at 11/19/21 2221   sodium chloride Stopped (11/14/21 0549)   sodium chloride     sodium chloride Stopped (11/19/21 2303)   sodium chloride     amiodarone 30 mg/hr (11/22/21 0506)   anticoagulant sodium citrate     cefTRIAXone (ROCEPHIN)  IV Stopped (11/21/21 0930)   heparin 1,700 Units/hr (11/22/21 0404)   lactated ringers Stopped (11/18/21 2130)   lactated ringers Stopped (11/17/21 2054)   prismasol BGK 4/2.5 1,500 mL/hr at 11/20/21 1002    PRN Medications: sodium chloride, alteplase, anticoagulant sodium citrate, heparin, heparin, HYDROmorphone (DILAUDID) injection, ipratropium-albuterol, lidocaine (PF), lidocaine-prilocaine, ondansetron (ZOFRAN) IV, oxyCODONE, pentafluoroprop-tetrafluoroeth, sodium  chloride, sodium chloride flush, sodium chloride flush    Patient Profile   66 y.o. male with history of CM with recovered EF, persistent atrial fibrillation, DM II, CKD, sick sinus syndrome, OSA, prior smoker. Underwent CABG X 3  and MAZE/PVI on 05/23>>post-op decrease in UOP and AKI with volume overload>>postcardiotomy shock requiring inotrope and pressor support  Assessment/Plan   1. Shock: Echo reviewed, LV EF 55-60%, RV poorly visualized but probably near-normal, no significant pericardial effusion. Suspect sepsis physiology with distributive shock.  Improved with broad spectrum abx. Lactate has cleared. Bcx 5/25 remain NGTD. Now off pressors.  - Continue midodrine to 15 mg TID - Completed course of vancomycin/cefepime. Back on abx 06/01 with rising WBC. Suspected possible aspiration. Broadening abx coverage today and adding antifungal. See below. 2. Acute on chronic primarily diastolic CHF: LV EF 33-29%, RV poorly visualized but probably near-normal, no significant pericardial effusion.  Patient is still volume overloaded, suspect primarily right heart failure physiology.  CVVHD started on 5/26. CVP 14-15 today.  HD catheter replaced 06/01. 1.9L volume off during HD run yesterday.  - CVP 15 this am. Will get HD again today. Discussed with Nephrology. 3. AKI on CKD stage 3: Baseline SCr 1.3-1.5 .  Suspect due to shock/ATN as well as elevated renal venous pressure in setting of volume overload, ileus, abdominal distention. CVVHD started 5/26. Remains nearly anuric.  Lost HD line 5/31, replaced 06/01. - HD today as above. CVP 15. SBP soft. Continue midodrine 15 TID. ? May need to consider adding NE if trouble tolerating HD. - Renal following 4. CAD: S/p CABG. No chest pain.  - Continue ASA, statin.  5. Atrial fibrillation with RVR: Has had Maze and LA appendage clip at time of CABG on 11/12/21.  - S/p TEE/DCCV to SR on 06//01. Back in AF this am, rate 100s. - Continue heparin gtt - Continue  amio gtt  30/hr - Plan for cardioversion again once more volume removed, likely 06/05 6. DM2: SSI.  7. ID: WBCs 22>>16K > 14K -> 12.8 -> 16 -> 17 -> 19.8 -> 22.  Afebrile.  Septic shock initially, resolved.  Blood cultures NGTD.  Treated for HCAP, completed vanc and cefepime.   - CCM added ceftriaxone 06/01 for possible aspiration  - Now with yeast in sputum, ?normal flora.  - Broadening abx today and adding antifungal. Discussed with CCM at bedside. 8. Ileus: Improving.  - Continue Reglan.  - Received Relastor 5/27   9. Pulmonary: Respiratory status improving with fluid removal and abx - HD for volume removal  - CCM following.    Continue to mobilize. PT/OT.    Length of Stay: [address], Lynder Parents, PA-C  11/22/2021, 7:48 AM  Advanced Heart Failure Team Pager [phone] (M-F; 7a - 5p)  Please contact [facility] for night-coverage after hours (5p -7a ) and weekends on [email or web address]  Patient seen with PA, agree with the above note.   Patient underwent TEE-guided DCCV to NSR yesterday.  TEE showed EF 55-60% with mild RV dysfunction.  Unfortunately, he went back into atrial fibrillation this morning.    CVP 14 today.  He had HD yesterday with 1.9L off.    CXR showed left basilar infiltrate.  He is afebrile but WBCs up to 22.  Currently on ceftriaxone.   General: NAD Neck: JVP 12-14 cm, no thyromegaly or thyroid nodule.  Lungs: Clear to auscultation bilaterally with normal respiratory effort. CV: Nondisplaced PMI.  Heart irregular S1/S2, no S3/S4, no murmur.  Trace ankle edema.  Abdomen: Soft, nontender, no hepatosplenomegaly, no distention.  Skin: Intact without lesions or rashes.  Neurologic: Alert and oriented x 3.  Psych: Normal affect. Extremities: No clubbing or cyanosis.  HEENT: Normal.   Back in  atrial fibrillation today after DCCV to NSR yesterday.   - Would work on further volume removal, then repeat DCCV likely on Monday if still in AF.  - Continue amiodarone gtt  and heparin gtt  Possible aspiration PNA with LLL infiltrate.  WBCs rising.  - Per CCM, will broaden abx and add antifungal coverage (yeast in sputum).   CVP 14, needs fluid off.  Will get HD today for ultrafiltration.  BP marginal, on midodrine 15 mg tid.  Can add NE if needed.   CRITICAL CARE Performed by: Loralie Champagne  Total critical care time: 35 minutes  Critical care time was exclusive of separately billable procedures and treating other patients.  Critical care was necessary to treat or prevent imminent or life-threatening deterioration.  Critical care was time spent personally by me on the following activities: development of treatment plan with patient and/or surrogate as well as nursing, discussions with consultants, evaluation of patient's response to treatment, examination of patient, obtaining history from patient or surrogate, ordering and performing treatments and interventions, ordering and review of laboratory studies, ordering and review of radiographic studies, pulse oximetry and re-evaluation of patient's condition.  Loralie Champagne 11/22/2021 8:22 AM

## 2021-11-22 NOTE — Progress Notes (Signed)
Inpatient Diabetes Program Recommendations  AACE/ADA: New Consensus Statement on Inpatient Glycemic Control (2015)  Target Ranges:  Prepandial:   less than 140 mg/dL      Peak postprandial:   less than 180 mg/dL (1-2 hours)      Critically ill patients:  140 - 180 mg/dL   Lab Results  Component Value Date   GLUCAP 244 (H) 11/22/2021   HGBA1C 9.3 (H) 11/08/2021    Review of Glycemic Control  Latest Reference Range & Units 11/22/21 03:45 11/22/21 07:27 11/22/21 11:17  Glucose-Capillary 70 - 99 mg/dL 85 97 244 (H)  (H): Data is abnormally high Diabetes history: Type 2 DM Outpatient Diabetes medications: Metformin 1000 mg BID, Farxiga 10 mg QD, Trulicity 3 mg Qwk, Glipizide 10 mg BID Current orders for Inpatient glycemic control: Levemir 16 units BID, Novolog 0-24 units Q4H  Inpatient Diabetes Program Recommendations:    Consider changing to TID & HS, if post prandials continue to exceed 180 mg/dL also consider Novolog 4 units TID (assuming patient consuming >50% of meals).   Spoke with patient and wife regarding outpatient diabetes management. Patient not engaged into conversation, however verifies home medications. Reports checking cbgs 2-3 times per week.  Reviewed patient's current A1c of 9.3%. Explained what a A1c is and what it measures. Also reviewed goal A1c with patient, importance of good glucose control @ home, and blood sugar goals. Reviewed patho of DM, role of pancreas, current inpatient insulin needs, risk for infection, vascular changes and commorbidities.  Patient's wife does most of the cooking. Admits to sweet tea, however wife has been trying recently towards sugar replacements. Has multiple questions. Discussed plate method, counting carb, reading nutritional labels, and importance of mindfulness. Will place consult for dietitian.  Encouraged to begin learning insulin injections. Will follow.  Discussed CGM with patient. Unsure at this point and wants to think about it.     Thanks, Bronson Curb, MSN, RNC-OB Diabetes Coordinator [phone] (8a-5p)

## 2021-11-22 NOTE — Plan of Care (Signed)
Brief Nutrition Note  RD consulted for nutrition education regarding diabetes. Unit RD is following pt with Initial Nutrition Assessment completed on 11/19/21. Per Diabetes Coordinator note, pt did not engage in conversation and education today but ex-wife did have several questions.  RD met with pt in room. Pt reports ex-wife has left for the day. Pt denies any nutrition-related questions at this time. Noted meal completions have been 25% today. Discussed with pt the importance of adequate PO intake to promote healing and maintain lean muscle mass. RD did not focus on diabetes-related diet education with pt.  Attempted to call pt's ex-wife via phone number listed in chart; however, no answer. RD left "Plate Method for People with Diabetes" handout in pt's room. Will attempt to provide additional diet education and answer ex-wife's questions at follow-up.  Lab Results  Component Value Date   HGBA1C 9.3 (H) 11/08/2021    Current diet order is 2 gram sodium with 1800 ml fluid restriction, patient is consuming approximately 25% of meals at this time.  RD will continue to follow pt during acute admission.   Gustavus Bryant, MS, RD, LDN Inpatient Clinical Dietitian Please see [facility] for contact information.

## 2021-11-22 NOTE — Procedures (Signed)
Thoracentesis  Procedure Note  [name]  [id number]  04/19/1956  Date:11/22/21  Time:1:25 PM   Provider Performing:Brent Lucky Alverson   Procedure: Thoracentesis with imaging guidance (42552)  Indication(s) Pleural Effusion  Consent Risks of the procedure as well as the alternatives and risks of each were explained to the patient and/or caregiver.  Consent for the procedure was obtained and is signed in the bedside chart  Anesthesia Topical only with 1% lidocaine    Time Out Verified patient identification, verified procedure, site/side was marked, verified correct patient position, special equipment/implants available, medications/allergies/relevant history reviewed, required imaging and test results available.   Sterile Technique Maximal sterile technique including full sterile barrier drape, hand hygiene, sterile gown, sterile gloves, mask, hair covering, sterile ultrasound probe cover (if used).  Procedure Description Ultrasound was used to identify appropriate pleural anatomy for placement and overlying skin marked.  Area of drainage cleaned and draped in sterile fashion. Lidocaine was used to anesthetize the skin and subcutaneous tissue.  900 cc's of serosanguinous appearing fluid was drained from the left pleural space. Catheter then removed and bandaid applied to site.   Complications/Tolerance None; patient tolerated the procedure well. Chest X-ray is ordered to confirm no post-procedural complication.   EBL Minimal   Specimen(s) Pleural fluid   Roselie Awkward, MD [facility] Pager: [phone] Cell: [phone] After 7:00 pm call Elink  [phone]

## 2021-11-22 NOTE — Anesthesia Postprocedure Evaluation (Signed)
Anesthesia Post Note  Patient: [name] [name]  Procedure(s) Performed: TRANSESOPHAGEAL ECHOCARDIOGRAM (TEE) CARDIOVERSION BUBBLE STUDY     Patient location during evaluation: PACU Anesthesia Type: General Level of consciousness: awake and alert Pain management: pain level controlled Vital Signs Assessment: post-procedure vital signs reviewed and stable Respiratory status: spontaneous breathing, nonlabored ventilation, respiratory function stable and patient connected to nasal cannula oxygen Cardiovascular status: blood pressure returned to baseline and stable Postop Assessment: no apparent nausea or vomiting Anesthetic complications: no   No notable events documented.  Last Vitals:  Vitals:   11/22/21 0716 11/22/21 0730  BP:    Pulse: 100   Resp: 19   Temp:  37.2 C  SpO2: 96%     Last Pain:  Vitals:   11/22/21 0730  TempSrc: Axillary  PainSc:                  Angel Hobdy S

## 2021-11-22 NOTE — Progress Notes (Addendum)
TCTS DAILY ICU PROGRESS NOTE                   [address]            ,[place] [address]          [id number]   1 Day Post-Op Procedure(s) (LRB): TRANSESOPHAGEAL ECHOCARDIOGRAM (TEE) (N/A) CARDIOVERSION (N/A) BUBBLE STUDY  Total Length of Stay:  LOS: 10 days   Subjective: Patient sitting in chair.  Objective: Vital signs in last 24 hours: Temp:  [97.3 F (36.3 C)-98.1 F (36.7 C)] 97.9 F (36.6 C) (06/02 0400) Pulse Rate:  [70-97] 87 (06/02 0530) Cardiac Rhythm: Normal sinus rhythm (06/01 2300) Resp:  [12-28] 14 (06/02 0530) BP: (91-131)/(45-99) 96/61 (06/02 0530) SpO2:  [83 %-100 %] 98 % (06/02 0530) FiO2 (%):  [50 %] 50 % (06/02 0100) Weight:  [122 kg] 122 kg (06/02 0500)  Filed Weights   11/19/21 0500 11/21/21 0500 11/22/21 0500  Weight: 116.6 kg 116.8 kg 122 kg    Weight change: 5.153 kg   Hemodynamic parameters for last 24 hours: CVP:  [3 mmHg-81 mmHg] 13 mmHg  Intake/Output from previous day: 06/01 0701 - 06/02 0700 In: 1045.6 [P.O.:200; I.V.:745.6; IV Piggyback:100] Out: 2150 [Urine:250]  Intake/Output this shift: No intake/output data recorded.  Current Meds: Scheduled Meds:  aspirin EC  325 mg Oral Daily   Or   aspirin  324 mg Per Tube Daily   bisacodyl  10 mg Rectal Q0600   Chlorhexidine Gluconate Cloth  6 each Topical Daily   Chlorhexidine Gluconate Cloth  6 each Topical Q0600   docusate sodium  200 mg Oral Daily   fluticasone furoate-vilanterol  1 puff Inhalation Daily   guaiFENesin  1,200 mg Oral BID   insulin aspart  0-24 Units Subcutaneous Q4H   insulin detemir  16 Units Subcutaneous Q12H   lactulose  10 g Oral Daily   lidocaine  2 patch Transdermal Q24H   metoCLOPramide (REGLAN) injection  10 mg Intravenous Q6H   midodrine  15 mg Oral TID WC   multivitamin  1 tablet Oral QHS   pantoprazole  40 mg Oral Daily   rosuvastatin  20 mg Oral Daily   sodium chloride flush  10-40 mL Intracatheter Q12H   sodium chloride  flush  3 mL Intravenous Q12H   Continuous Infusions:   prismasol BGK 4/2.5 400 mL/hr at 11/19/21 2213    prismasol BGK 4/2.5 400 mL/hr at 11/19/21 2221   sodium chloride Stopped (11/14/21 0549)   sodium chloride     sodium chloride Stopped (11/19/21 2303)   sodium chloride     amiodarone 30 mg/hr (11/22/21 0506)   anticoagulant sodium citrate     cefTRIAXone (ROCEPHIN)  IV Stopped (11/21/21 0930)   heparin 1,700 Units/hr (11/22/21 0404)   lactated ringers Stopped (11/18/21 2130)   lactated ringers Stopped (11/17/21 2054)   prismasol BGK 4/2.5 1,500 mL/hr at 11/20/21 1002   PRN Meds:.sodium chloride, alteplase, anticoagulant sodium citrate, heparin, heparin, HYDROmorphone (DILAUDID) injection, ipratropium-albuterol, lidocaine (PF), lidocaine-prilocaine, ondansetron (ZOFRAN) IV, oxyCODONE, pentafluoroprop-tetrafluoroeth, sodium chloride, sodium chloride flush, sodium chloride flush  General appearance: alert, cooperative, and no distress Neurologic: intact Heart: IRRR IRRR Lungs: Slightly diminished left basilar breath sounds Abdomen: Soft, obese, non tender, bowel sounds Extremities:No LE edema. Ecchymosis right thigh Wound: Sternal wound and RLE wounds are clean and dry. Small skin tear left side of neck  Lab Results: CBC: Recent Labs    11/21/21 0501 11/22/21 0455  WBC 19.8* 22.2*  HGB 8.7* 8.4*  HCT 27.9* 26.7*  PLT 269 253    BMET:  Recent Labs    11/21/21 1848 11/22/21 0455  NA 130* 133*  K 4.4 3.7  CL 96* 99  CO2 17* 24  GLUCOSE 279* 72  BUN 62* 40*  CREATININE 5.54* 4.00*  CALCIUM 8.1* 8.3*     CMET: Lab Results  Component Value Date   WBC 22.2 (H) 11/22/2021   HGB 8.4 (L) 11/22/2021   HCT 26.7 (L) 11/22/2021   PLT 253 11/22/2021   GLUCOSE 72 11/22/2021   ALT 9 11/18/2021   AST 26 11/18/2021   NA 133 (L) 11/22/2021   K 3.7 11/22/2021   CL 99 11/22/2021   CREATININE 4.00 (H) 11/22/2021   BUN 40 (H) 11/22/2021   CO2 24 11/22/2021   INR 1.4 (H)  11/12/2021   HGBA1C 9.3 (H) 11/08/2021      PT/INR:  No results for input(s): LABPROT, INR in the last 72 hours.  Radiology: DG CHEST PORT 1 VIEW  Result Date: 11/21/2021 CLINICAL DATA:  Shortness of breath chest pain. EXAM: PORTABLE CHEST 1 VIEW COMPARISON:  Chest radiograph performed earlier on the same day FINDINGS: The heart is enlarged. Evidence of prior coronary artery bypass grafting. Left IJ access catheter with distal tip in the SVC. Right access PICC with distal tip in the SVC. Mild elevation of the left hemidiaphragm with left basilar opacity which may represent small effusion/atelectasis, unchanged. IMPRESSION: Interval placement of left IJ access central line. Stable cardiomegaly with left basilar opacity suggesting effusion/atelectasis, unchanged. Electronically Signed   By: Keane Police D.O.   On: 11/21/2021 16:32   ECHO TEE  Result Date: 11/21/2021    TRANSESOPHOGEAL ECHO REPORT   Patient Name:   [name] [name] Date of Exam: 11/21/2021 Medical Rec #:  [id number]    Height:       70.0 in Accession #:    [id number]   Weight:       257.6 lb Date of Birth:  11-27-1955   BSA:          2.324 m Patient Age:    66 years     BP:           97/60 mmHg Patient Gender: M            HR:           83 bpm. Exam Location:  Inpatient Procedure: Transesophageal Echo, Color Doppler and Saline Contrast Bubble Study Indications:     I48.91* Unspeicified atrial fibrillation  History:         Patient has prior history of Echocardiogram examinations, most                  recent 11/13/2021. CHF, CAD, Arrythmias:Bradycardia and Atrial                  Fibrillation; Risk Factors:Dyslipidemia, Hypertension and                  Diabetes.  Sonographer:     Bernadene Person RDCS Referring Phys:  [place] Diagnosing Phys: Franki Monte PROCEDURE: After discussion of the risks and benefits of a TEE, an informed consent was obtained from the patient. The transesophogeal probe was passed without difficulty  through the esophogus of the patient. Sedation performed by different physician. The patient was monitored while under deep sedation. Anesthestetic sedation was provided intravenously by Anesthesiology: 56.28mg  of Propofol. The patient's  vital signs; including heart rate, blood pressure, and oxygen saturation; remained stable throughout the procedure. The patient developed no complications during the procedure. A successful direct current cardioversion was performed at 200 joules with 1 attempt. IMPRESSIONS  1. Left ventricular ejection fraction, by estimation, is 55 to 60%. The left ventricle has normal function. The left ventricle has no regional wall motion abnormalities but there is septal bounce suggestive of prior cardiac surgery.  2. Right ventricular systolic function is mildly reduced. The right ventricular size is normal. Tricuspid regurgitation signal is inadequate for assessing PA pressure.  3. Left atrial size was mildly dilated. No left atrial/left atrial appendage thrombus was detected.  4. There is a PFO by color doppler and bubble study.  5. The mitral valve is normal in structure. Mild mitral valve regurgitation. No evidence of mitral stenosis.  6. The aortic valve is tricuspid. There is mild calcification of the aortic valve. Aortic valve regurgitation is not visualized. No aortic stenosis is present.  7. A small pericardial effusion is present, primarily towards the apex. FINDINGS  Left Ventricle: Left ventricular ejection fraction, by estimation, is 55 to 60%. The left ventricle has normal function. The left ventricle has no regional wall motion abnormalities. The left ventricular internal cavity size was normal in size. There is  no left ventricular hypertrophy. Right Ventricle: The right ventricular size is normal. No increase in right ventricular wall thickness. Right ventricular systolic function is mildly reduced. Tricuspid regurgitation signal is inadequate for assessing PA pressure. Left  Atrium: Left atrial size was mildly dilated. No left atrial/left atrial appendage thrombus was detected. Right Atrium: Right atrial size was normal in size. Pericardium: A small pericardial effusion is present. Mitral Valve: The mitral valve is normal in structure. Mild mitral valve regurgitation. No evidence of mitral valve stenosis. Tricuspid Valve: The tricuspid valve is normal in structure. Tricuspid valve regurgitation is trivial. Aortic Valve: The aortic valve is tricuspid. There is mild calcification of the aortic valve. Aortic valve regurgitation is not visualized. No aortic stenosis is present. Pulmonic Valve: The pulmonic valve was normal in structure. Pulmonic valve regurgitation is not visualized. Aorta: The aortic root is normal in size and structure. IAS/Shunts: There is a PFO by color doppler and bubble study. Agitated saline contrast was given intravenously to evaluate for intracardiac shunting. [employer] Electronically signed by Franki Monte Signature Date/Time: 11/21/2021/3:32:50 PM    Final      Assessment/Plan: S/P Procedure(s) (LRB): TRANSESOPHAGEAL ECHOCARDIOGRAM (TEE) (N/A) CARDIOVERSION (N/A) BUBBLE STUDY CV-History of persistent atrial fibrillation. S/p TEE/DCCV. LVEF 55-60%, PFO. A flutter/a fib post op.  On Amiodarone and heparin drips. Also, on Midodrine 15 mg tid.  Pulmonary-History of OSA on CPAP. On bi pap this am. CXR this am appears stable. Continue Breo Ellipta and Xopenex. Encourage incentive spirometer and flutter valve. 3. Acute on chronic CHF- CVP yesterday 13. Appreciate advanced heart failure's assistance 4. Expected post op blood loss anemia-H and H this am stable at 8.4 and 26.7 5. DM-CBGs 342/224/85. On Insulin ut will adjust for better glucose control. Pre op HGA1C 9.3. Will not restart Metformin or Glipizide yet as creatinine elevated. Will need close medical follow up after discharge 6. AKI on CKD (stage III)-Creatinine increased to 4. Nephrology  following and arranging for CVVH. HD catheter removed yesterday and new one placed by CCM. 7. Leukocytosis, afebrile-On Ceftriaxone and may add anitfungal. WBC slightly increased to 22,200. New central line placed.  Respiratory culture result pending;few yeast, rare gram positive rods,  and abundant WBC on gram stain. Blood cultures show NGTD. No sign of wound infection;possibly pulmonary etiology but will check UA/UC 8. Physical deconditioning-home PT ordered 9. Patient still with wires but on Heparin drip.  Donielle Liston Alba PA-C 11/22/2021 7:16 AM   On fairly stable HD schedule- but feels weak WBC increasing - discussed broadening antibiotics with CCM Back in afib - stop breo-elipta Keep in ICU with high risk for sepsis Leave EPWs while pt on heparin  patient examined and medical record reviewed,agree with above note. Dahlia Byes 11/22/2021

## 2021-11-22 NOTE — TOC CM/SW Note (Addendum)
HF TOC CM spoke to pt and gave permission to speak to wife, Thayer Headings. Contacted ex-wife and discussed HH. Offered choice and agreeable to agency that will accept his plan. (Medicare.gov list with ratings on chart). Contacted [facility] rep, with new referral. Pt will need Rolling Walker with Seat and 3n1 bsc. Pt may also need oxygen. Will have Unit RN check walking oxygen sat closer to dc dated for possible oxygen at home. Will need HH RN and PT orders with F2F. Pt has declined SNF rehab. But maybe agreeable to IP rehab if PT recommends. [place], Heart Failure TOC CM [phone]

## 2021-11-22 NOTE — Plan of Care (Signed)

## 2021-11-22 NOTE — Progress Notes (Addendum)
NAME:  [name] [name], MRN:  [id number], DOB:  April 09, 1956, LOS: 65 ADMISSION DATE:  11/12/2021, CONSULTATION DATE:  11/13/2021 REFERRING MD:  Dr. Aundra Dubin , CHIEF COMPLAINT:  Respiratory distress    History of Present Illness:  [name] [name] is a 66 y.o. male with a PMH significant for but not limited to CAD now s/p CABG, systolic congestive heart failure, CKD, type 2 diabetes, HTN, A-fib chronically anticoagulated, and OSA who presented to the ED 5/23 for elective CABG with MAZE procedure Dr. Lawson Fiscal.  Patient was extubated per heart failure protocol with no incident by by afternoon of 5/24 PCCM was consulted for progressive increase work of breathing.   Pertinent  Medical History  CAD no s/p CABG, systolic congestive heart failure, CKD, type 2 diabetes, HTN, A-fib chronically anticoagulated, and OSA   Significant Hospital Events: Including procedures, antibiotic start and stop dates in addition to other pertinent events   5/23 underwent CABG with MAZE procedure with Dr. Lawson Fiscal 5/24 PCCM consulted for progressive increased work of breathing  5/25 Urine output remained low overnight along with soft BP which prompted addition of vaso and low dose bicarb drip  5/26 CRRT started 5/31 CRRT stopped with dislodged catheter.   6/1 Cardioversion.  On RA, off vasopressors. New HD cath placed.  Interim History / Subjective:  Tmax 99  Yeast in sputum  Back in AF overnight  Remains on amio + heparin infusions  Climbing WBC On 2L  Small smear this am, does not feel constipated   Objective   Blood pressure 96/61, pulse 100, temperature 99 F (37.2 C), temperature source Axillary, resp. rate 19, height 5\' 10"  (1.778 m), weight 122 kg, SpO2 96 %. CVP:  [3 mmHg-81 mmHg] 13 mmHg  FiO2 (%):  [50 %] 50 %   Intake/Output Summary (Last 24 hours) at 11/22/2021 0751 Last data filed at 11/22/2021 0700 Gross per 24 hour  Intake 1145.63 ml  Output 2150 ml  Net -1004.37 ml   Filed Weights   11/19/21  0500 11/21/21 0500 11/22/21 0500  Weight: 116.6 kg 116.8 kg 122 kg    Examination: General:  ill appearing adult male sitting up in chair in NAD HEENT: MM pink/moist, [place] O2, anicteric, pupils equal/reactive  Neuro: AAOx4, speech clear, MAE CV: s1s2 irr irr, AF 90's on monitor, no m/r/g PULM: non-labored at rest, lungs bilaterally with good air entry, clear anterior, diminished bases  GI: soft, bsx4 active, protuberant  Extremities: warm/dry, 2+ pre-tibial pitting edema  Skin: no rashes.  Mid sternal wound clean/dry.  Bruising to left thigh.   CXR 6/2 - images personally reviewed, left basilar atelectasis with pleural effusion   [place] Assessment of L Pleural Space     Resolved Hospital Problem list   Acute hypoxic respiratory failure Constipation Ileus  Assessment & Plan:   CAD S/P CABG x3 Underwent elective CABG with Dr. Lawson Fiscal 5/23 -ICU monitoring  -ASA + crestor   Shock mixed cardiogenic and septic Acute on chronic diastolic CHF Previously on vanco + cefepime, transitioned to ceftriaxone. Since that time WBC climbing.  Resp culture with yeast, GPR.  EF 55-60% -expand abx and add empiric antifungal coverage 6/2  -follow respiratory culture to maturity  -midodrine 15mg  TID  -follow Co-Ox  Chronic atrial fibrillation with rapid ventricular response, status post maze procedure and LAA appendage clip -now EKG, appears to be back in AF  -continue IV amiodarone  -heparin per pharmacy  -plan for DCCV again on 6/5   Left Pleural Effusion with  Suspected Basilar Opacity  -L pleural space assessed with [place], see above. Estimate 300-539ml free flowing pleural fluid -follow intermittent CXR  Obstructive sleep apnea -CPAP QHS and PRN day time sleeping    Poorly controlled DM2 Hemoglobin A1c 9.3 -continue SSI, goal CBG 140-180 -Levemir  -hold home glipizide, metformin, farxiga    Oliguric AKI superimposed on CKD stage 3a due to ischemic ATN CRRT was stopped 5/31 after HD  catheter was dislodged.  Catheter replaced 6/2 -Trend BMP / urinary output -Replace electrolytes as indicated -Avoid nephrotoxic agents, ensure adequate renal perfusion -appreciate Nephrology > plan for iHD 6/2   Best Practice (right click and "Reselect all SmartList Selections" daily)  Diet/type: cardiac diet DVT prophylaxis: heparin gtt GI prophylaxis: PPI Lines: Central line Foley:  Yes, and it is still needed Code Status:  full code Last date of multidisciplinary goals of care discussion: Per primary       Noe Gens, MSN, APRN, NP-C, AGACNP-BC [facility] 11/22/2021, 8:12 AM   Please see [email or web address] for pager details.   From 7A-7P if no response, please call [phone] After hours, please call ELink [phone]

## 2021-11-22 NOTE — Progress Notes (Signed)
Physical Therapy Treatment Patient Details Name: [name] [name] MRN: [id number] DOB: 1956-02-08 Today's Date: 11/22/2021   History of Present Illness Pt is a 66 y.o. male admitted 11/12/21 for multivessel CABG with Maze procedure; extubated 5/24. Postop course complicated by oliguria and NSVT vs. Afib/RVR, shock, worsening respiratory status, suspected ileus. Pt also with worsening renal function; CRRT initiated 5/26. S/p thoracentesis 6/2. PMH includes CAD, CHF, CKD, DM2, HTN, afib (chronic anticoagulation), OSA.    PT Comments    Pt self-limiting in mobility progress this date as he was resistive to progressing gait/mobility initially. Pt needing max encouragement and education from PT and RN for pt to participate in gait training, only tolerating ambulating ~85 ft before fatiguing and reporting pain as a limiting factor. Pt refused to ambulate back to room after seated rest break and instead insisted on pushing himself backwards with his legs while seated on the rollator. Pt initially requesting PT to push him back to the room, but eventually pt agreed to push himself to further encourage quads strengthening/endurance training. Pt needs reminders for sternal precautions with transfers. Will continue to follow acutely. Provided his family can assist him the amount he currently needs, current recommendations remain appropriate.     Recommendations for follow up therapy are one component of a multi-disciplinary discharge planning process, led by the attending physician.  Recommendations may be updated based on patient status, additional functional criteria and insurance authorization.  Follow Up Recommendations  Home health PT     Assistance Recommended at Discharge Intermittent Supervision/Assistance  Patient can return home with the following A little help with walking and/or transfers;A little help with bathing/dressing/bathroom;Assistance with cooking/housework;Assist for transportation;Help  with stairs or ramp for entrance   Equipment Recommendations  Rollator (4 wheels) (bari)    Recommendations for Other Services       Precautions / Restrictions Precautions Precautions: Fall;Sternal;Other (comment) Precaution Booklet Issued: No Restrictions Weight Bearing Restrictions: Yes Other Position/Activity Restrictions: sternal precautions     Mobility  Bed Mobility Overal bed mobility: Needs Assistance Bed Mobility: Sit to Sidelying         Sit to sidelying: Min assist, HOB elevated General bed mobility comments: Cues to lean laterally to elbow, minA to lift legs.    Transfers Overall transfer level: Needs assistance Equipment used: Rollator (4 wheels), None Transfers: Sit to/from Stand, Bed to chair/wheelchair/BSC Sit to Stand: Min guard, Min assist   Step pivot transfers: Min assist       General transfer comment: Pt needs reminders to maintain sternal precautions with transfers. Pt varying from being able to come to stand with min guard-minA. MinA for stability when taking steps between surfaces without UE support    Ambulation/Gait Ambulation/Gait assistance: Min guard, Min assist Gait Distance (Feet): 85 Feet (x3 bouts of ~85 ft > ~2 ft > ~5 ft) Assistive device: Rollator (4 wheels), None Gait Pattern/deviations: Step-through pattern, Decreased stride length, Trunk flexed Gait velocity: reduced Gait velocity interpretation: <1.31 ft/sec, indicative of household ambulator   General Gait Details: Pt with slow gait using rollator, min guard-minA for stability. Pt took steps between rollator > commode > bed for transfers without UE support, minA for stability   Stairs             Wheelchair Mobility    Modified Rankin (Stroke Patients Only)       Balance Overall balance assessment: Needs assistance Sitting-balance support: No upper extremity supported, Feet supported Sitting balance-Leahy Scale: Fair     Standing balance  support:  Bilateral upper extremity supported, No upper extremity supported, During functional activity Standing balance-Leahy Scale: Fair Standing balance comment: can static stand without UE support; static and dynamic stability improved with UE support                            Cognition Arousal/Alertness: Awake/alert Behavior During Therapy: WFL for tasks assessed/performed, Flat affect Overall Cognitive Status: Within Functional Limits for tasks assessed                                 General Comments: WFL for simple tasks; intermittent cues for sternal precautions; Pt a bit resistive to trying to progress mobility, needing max encouragement from therapist and RN to go further than just recliner > bed, declining to ambulate back to room once performed seated rest break in hall but instead pushing himself back with his legs while seated on rollator        Exercises Other Exercises Other Exercises: Knee extension/quad strengthening/endurance training with resistance through pt pushing self backwards with his legs while seated in rollator, x ~85 ft, min guard    General Comments        Pertinent Vitals/Pain Pain Assessment Pain Assessment: Faces Faces Pain Scale: Hurts little more Pain Location: "everywhere" Pain Descriptors / Indicators: Discomfort, Operative site guarding, Tiring Pain Intervention(s): Limited activity within patient's tolerance, Monitored during session, Repositioned, Patient requesting pain meds-RN notified    Home Living                          Prior Function            PT Goals (current goals can now be found in the care plan section) Acute Rehab PT Goals Patient Stated Goal: to return to bed PT Goal Formulation: With patient Time For Goal Achievement: 11/30/21 Potential to Achieve Goals: Good Progress towards PT goals: Not progressing toward goals - comment (pt self-limiting)    Frequency    Min 3X/week       PT Plan Equipment recommendations need to be updated    Co-evaluation              AM-PAC PT "6 Clicks" Mobility   Outcome Measure  Help needed turning from your back to your side while in a flat bed without using bedrails?: A Little Help needed moving from lying on your back to sitting on the side of a flat bed without using bedrails?: A Lot Help needed moving to and from a bed to a chair (including a wheelchair)?: A Little Help needed standing up from a chair using your arms (e.g., wheelchair or bedside chair)?: A Little Help needed to walk in hospital room?: A Little Help needed climbing 3-5 steps with a railing? : A Lot 6 Click Score: 16    End of Session   Activity Tolerance: Patient limited by fatigue;Other (comment) (pt self-limiting) Patient left: in bed;with call bell/phone within reach;with bed alarm set Nurse Communication: Mobility status PT Visit Diagnosis: Other abnormalities of gait and mobility (R26.89);Muscle weakness (generalized) (M62.81);Unsteadiness on feet (R26.81);Difficulty in walking, not elsewhere classified (R26.2)     Time: 6384-5364 PT Time Calculation (min) (ACUTE ONLY): 38 min  Charges:  $Gait Training: 8-22 mins $Therapeutic Exercise: 8-22 mins $Therapeutic Activity: 8-22 mins  [name], PT, DPT Acute Rehabilitation Services  Pager: [phone] Office: [place] 11/22/2021, 6:03 PM

## 2021-11-23 ENCOUNTER — Inpatient Hospital Stay (HOSPITAL_COMMUNITY): Payer: Medicare HMO

## 2021-11-23 DIAGNOSIS — Z951 Presence of aortocoronary bypass graft: Secondary | ICD-10-CM | POA: Diagnosis not present

## 2021-11-23 DIAGNOSIS — I5031 Acute diastolic (congestive) heart failure: Secondary | ICD-10-CM | POA: Diagnosis not present

## 2021-11-23 LAB — CBC
HCT: 23.9 % — ABNORMAL LOW (ref 39.0–52.0)
Hemoglobin: 7.9 g/dL — ABNORMAL LOW (ref 13.0–17.0)
MCH: 31.1 pg (ref 26.0–34.0)
MCHC: 33.1 g/dL (ref 30.0–36.0)
MCV: 94.1 fL (ref 80.0–100.0)
Platelets: 248 10*3/uL (ref 150–400)
RBC: 2.54 MIL/uL — ABNORMAL LOW (ref 4.22–5.81)
RDW: 15.7 % — ABNORMAL HIGH (ref 11.5–15.5)
WBC: 24.7 10*3/uL — ABNORMAL HIGH (ref 4.0–10.5)
nRBC: 0 % (ref 0.0–0.2)

## 2021-11-23 LAB — RENAL FUNCTION PANEL
Albumin: 3 g/dL — ABNORMAL LOW (ref 3.5–5.0)
Albumin: 3 g/dL — ABNORMAL LOW (ref 3.5–5.0)
Anion gap: 15 (ref 5–15)
Anion gap: 17 — ABNORMAL HIGH (ref 5–15)
BUN: 60 mg/dL — ABNORMAL HIGH (ref 8–23)
BUN: 69 mg/dL — ABNORMAL HIGH (ref 8–23)
CO2: 20 mmol/L — ABNORMAL LOW (ref 22–32)
CO2: 18 mmol/L — ABNORMAL LOW (ref 22–32)
Calcium: 8.2 mg/dL — ABNORMAL LOW (ref 8.9–10.3)
Calcium: 8.2 mg/dL — ABNORMAL LOW (ref 8.9–10.3)
Chloride: 96 mmol/L — ABNORMAL LOW (ref 98–111)
Chloride: 93 mmol/L — ABNORMAL LOW (ref 98–111)
Creatinine, Ser: 5.71 mg/dL — ABNORMAL HIGH (ref 0.61–1.24)
Creatinine, Ser: 6.36 mg/dL — ABNORMAL HIGH (ref 0.61–1.24)
GFR, Estimated: 10 mL/min — ABNORMAL LOW (ref >60)
GFR, Estimated: 9 mL/min — ABNORMAL LOW (ref >60)
Glucose, Bld: 101 mg/dL — ABNORMAL HIGH (ref 70–99)
Glucose, Bld: 267 mg/dL — ABNORMAL HIGH (ref 70–99)
Phosphorus: 7.6 mg/dL — ABNORMAL HIGH (ref 2.5–4.6)
Phosphorus: 7.7 mg/dL — ABNORMAL HIGH (ref 2.5–4.6)
Potassium: 4.2 mmol/L (ref 3.5–5.1)
Potassium: 4.6 mmol/L (ref 3.5–5.1)
Sodium: 131 mmol/L — ABNORMAL LOW (ref 135–145)
Sodium: 128 mmol/L — ABNORMAL LOW (ref 135–145)

## 2021-11-23 LAB — BPAM RBC
Blood Product Expiration Date: 202306082359
Blood Product Expiration Date: 202306082359
ISSUE DATE / TIME: 202305172053
Unit Type and Rh: 6200
Unit Type and Rh: 6200

## 2021-11-23 LAB — MAGNESIUM: Magnesium: 2.3 mg/dL (ref 1.7–2.4)

## 2021-11-23 LAB — GLUCOSE, CAPILLARY
Glucose-Capillary: 103 mg/dL — ABNORMAL HIGH (ref 70–99)
Glucose-Capillary: 161 mg/dL — ABNORMAL HIGH (ref 70–99)
Glucose-Capillary: 174 mg/dL — ABNORMAL HIGH (ref 70–99)
Glucose-Capillary: 196 mg/dL — ABNORMAL HIGH (ref 70–99)
Glucose-Capillary: 233 mg/dL — ABNORMAL HIGH (ref 70–99)
Glucose-Capillary: 259 mg/dL — ABNORMAL HIGH (ref 70–99)

## 2021-11-23 LAB — TYPE AND SCREEN
ABO/RH(D): A POS
Antibody Screen: NEGATIVE
Unit division: 0
Unit division: 0

## 2021-11-23 LAB — CULTURE, RESPIRATORY W GRAM STAIN

## 2021-11-23 LAB — HEPARIN LEVEL (UNFRACTIONATED): Heparin Unfractionated: 0.42 IU/mL (ref 0.30–0.70)

## 2021-11-23 MED ORDER — VANCOMYCIN VARIABLE DOSE PER UNSTABLE RENAL FUNCTION (PHARMACIST DOSING): Status: DC

## 2021-11-23 MED ORDER — HEPARIN SODIUM (PORCINE) 1000 UNIT/ML IJ SOLN
INTRAMUSCULAR | Status: AC
  Filled 2021-11-23: qty 4

## 2021-11-23 MED ORDER — ALBUMIN HUMAN 25 % IV SOLN
INTRAVENOUS | Status: AC
  Administered 2021-11-24: 12.5 g via INTRAVENOUS
  Filled 2021-11-23: qty 100

## 2021-11-23 NOTE — Progress Notes (Signed)
   NAME:  [name] [name], MRN:  [id number], DOB:  03-31-1956, LOS: 68 ADMISSION DATE:  11/12/2021, CONSULTATION DATE:  5/24 REFERRING MD:  Aundra Dubin, CHIEF COMPLAINT:  Dyspnea   History of Present Illness:  66 y/o male with multiple medical problems admitted for CABG and MAZE on 1/24 complicated by acute hypoxemic respiratory failure and renal failure requiring hemodialysis.  Pertinent  Medical History  CAD no s/p CABG, systolic congestive heart failure, CKD, type 2 diabetes, HTN, A-fib chronically anticoagulated, and OSA  Significant Hospital Events: Including procedures, antibiotic start and stop dates in addition to other pertinent events   5/23 underwent CABG with MAZE procedure with Dr. Lawson Fiscal 5/24 PCCM consulted for progressive increased work of breathing  5/25 Urine output remained low overnight along with soft BP which prompted addition of vaso and low dose bicarb drip  5/26 CRRT started 5/31 CRRT stopped with dislodged catheter.   6/1 Cardioversion.  On RA, off vasopressors. New HD cath placed. 6/2 left thoracentesis, 900 cc fluid removed: exudative, LDH 544, 707 WBC (lymph, PMN, Monocytes essentially all the same); antibiotics broadened given worsening leukocytosis  Interim History / Subjective:  Has low back pain consistent with what he has had in the past   Objective   Blood pressure 97/62, pulse 96, temperature (!) 97.5 F (36.4 C), temperature source Axillary, resp. rate (!) 25, height 5\' 10"  (1.778 m), weight 121.1 kg, SpO2 94 %. CVP:  [12 mmHg-15 mmHg] 13 mmHg  FiO2 (%):  [50 %] 50 %   Intake/Output Summary (Last 24 hours) at 11/23/2021 0745 Last data filed at 11/22/2021 2342 Gross per 24 hour  Intake 1711.52 ml  Output 2300 ml  Net -588.48 ml   Filed Weights   11/21/21 0500 11/22/21 0500 11/23/21 0500  Weight: 116.8 kg 122 kg 121.1 kg    Examination:  General:  Chronically ill appearing, resting comfortably in chair HENT: NCAT OP clear PULM: CTA B, normal  effort CV: Irreg irreg GI: BS+, soft, nontender MSK: normal bulk and tone Neuro: awake, alert, no distress, MAEW   Resolved Hospital Problem list   Acute hypoxic respiratory failure Constipation Ileus    Assessment & Plan:  CAD s/p CABG 5/23 Tele Management per TCTS/Heart failure  Cardiogenic and septic shock > resolved Acute on chronic HFrEF Management per advanced heart failure  Chronic atrial fibrillation s/p MAZE and LAA appendage clip Tele Amiodarone per heart failure Cardioversion per heart failure  L pleural effusion> reactive post op CXR prn  Leukocytosis> unclear etiology; may have HCAP but normal oxygenation and CXR reassuring; lines all new Will likely narrow antibiotics on 6/4  Blood culture on 6/4 if WBC rising  OSA CPAP nightly  DM2 SSI to continue  Oliguric AKI Nephrology planning iHD today Monitor BMET and UOP Replace electrolytes as needed   Best Practice (right click and "Reselect all SmartList Selections" daily)   Per primary   Roselie Awkward, MD [facility] Pager: [phone] Cell: [phone] After 7:00 pm call Elink  [phone]

## 2021-11-23 NOTE — Progress Notes (Signed)
Pt placed on BIPAP for the night. Tol well

## 2021-11-23 NOTE — Progress Notes (Addendum)
Offered to take patient for a walk. Encouraged and educated to benefits of mobility and potential complications of refusing movement. Pt still declines to ambulate at this time. Pt given printed educational handouts in regards to benefits of mobility, complications of bedrest, and goals PT/OT.

## 2021-11-23 NOTE — Progress Notes (Signed)
[place] for IV heparin Indication: atrial fibrillation  No Known Allergies  Patient Measurements: Height: 5\' 10"  (177.8 cm) Weight: 121.1 kg (266 lb 15.6 oz) IBW/kg (Calculated) : 73 Heparin Dosing Weight: ~ 100 kg  Vital Signs: Temp: 97.5 F (36.4 C) (06/03 0400) Temp Source: Axillary (06/03 0400) BP: 97/62 (06/03 0700) Pulse Rate: 96 (06/03 0700)  Labs: Recent Labs    11/21/21 0501 11/21/21 1848 11/22/21 0455 11/22/21 1628 11/23/21 0434  HGB 8.7*  --  8.4*  --  7.9*  HCT 27.9*  --  26.7*  --  23.9*  PLT 269  --  253  --  248  HEPARINUNFRC 0.48  --  0.65  --  0.42  CREATININE 4.74*   < > 4.00* 4.86* 5.71*   < > = values in this interval not displayed.     Estimated Creatinine Clearance: 16.4 mL/min (A) (by C-G formula based on SCr of 5.71 mg/dL (H)).   Medical History: Past Medical History:  Diagnosis Date   Atrial fibrillation ([place])    Benign essential HTN 07/30/2014   Bradycardia 01/25/2015   CHF (congestive heart failure) (HCC)    Chronic anticoagulation 82/50/5397   Chronic systolic CHF (congestive heart failure), NYHA class 3 ([place]) 08/30/2014   Overview:  Global ef 30%   CKD (chronic kidney disease)    Class 3 severe obesity in adult ([place]) 04/28/2017   Coronary artery disease    Diabetes mellitus without complication (HCC)    Diabetic neuropathy ([place]) 07/28/2014   Dysrhythmia    Erectile dysfunction    High risk medication use 05/26/2018   Hypertension    Hypertensive heart disease with heart failure ([place]) 07/30/2014   Lumbago    LV dysfunction 04/28/2017   Mixed hyperlipidemia 07/28/2014   OSA (obstructive sleep apnea)    CPAP   Paroxysmal atrial fibrillation ([place]) 07/28/2014   Sinus node dysfunction ([place]) 04/21/2016   Testicular hypofunction    Uncontrolled type 2 diabetes mellitus with microalbuminuric diabetic nephropathy 07/28/2014   Assessment: 66 yo male on chronic Xarelto for afib.  Now s/p  CABG with MAZE, and on CRRT.  Remains in afib.  Pharmacy asked to dose IV heparin. Targeting lowering heparin level goal given recent OHS.  Heparin level remains therapeutic at 0.42, H/H and pltc stable.  Plans for DCCV on 6/5  Goal of Therapy:  Heparin level 0.3-0.5 Monitor platelets by anticoagulation protocol: Yes   Plan:  Continue heparin 1700 units/hr Daily heparin level and CBC  Hildred Laser, PharmD Clinical Pharmacist **Pharmacist phone directory can now be found on [email or web address] ([email or web address]).  Listed under [place].

## 2021-11-23 NOTE — Progress Notes (Signed)
Patient ID: [name] [name], male   DOB: [birth year], 66 y.o.   MRN: [id number] [facility] Progress Note   Assessment/ Plan:   1.  Acute kidney injury on chronic kidney disease stage IIIa:  -AKI likely ischemic ATN in the setting of septic versus cardiogenic shock.  Started on CRRT 5/26 for extracorporeal volume unloading after he had fixed urine output on Lasix drip that was inadequate for volume unloading. CRRT 5/26-5/31 -unable to receive [facility] given leukocytosis/infection. New temp line placed 6/1 (previous line dislodged). First HD 6/1, HD today, tentatively planning for HD on Monday -no evidence of renal recovery at this junction, will need his HD catheter converted to [facility] down the road  2.  Coronary artery disease status post three-vessel CABG:  -on aspirin/statin. 3. Acute on chronic dCHF -UF as tolerated with HD, still volume overloaded. Will plan for a sequential treatment today for UF only -milrinone stopped 5/29 4. Shock -possibly from sepsis. Abx/pressor support per primary service. Cxr suspicious for PNA -off pressors and on midodrine now 5.  Atrial fibrillation:  -Status post MAZE procedure at the time of CABG. Per primary service, on amio. S/p tee-dccv 6/1 -planning for another cardioversion once more volume has been removed 6.  Anemia:  -Likely associated with recent surgery and postoperative inflammatory response.  transfuse PRN for hgb <7 -iron panel ordered to assess for ESA needs, will likely avoid IV Fe given infection 7.  Hyponatremia -Managing with HD and UF  Discussed w/ primary service   Subjective:   Patient seen and examined.  Tolerated sequential treatment yesterday with a net UF of 2.3 L.  Biggest complaint today is back pain rating to his left leg, has difficulty lifting up his left leg. CVP 12 Status post left thoracentesis 6/2 with 900 cc drained Remains off pressors.   Objective:   BP 97/62 (BP Location: Right Arm)   Pulse 96   Temp  98.7 F (37.1 C) (Oral)   Resp (!) 25   Ht 5\' 10"  (1.778 m)   Wt 121.1 kg   SpO2 94%   BMI 38.31 kg/m   Intake/Output Summary (Last 24 hours) at 11/23/2021 3790 Last data filed at 11/23/2021 2409 Gross per 24 hour  Intake 1787 ml  Output 2300 ml  Net -513 ml   Weight change: -0.9 kg  Physical Exam: Gen: NAD, in recliner CVS: irregular, S1 and S2 normal, clean midline sternotomy incision Resp: diminished air entry bibasilar, no w/r/r/c, normal WOB, on RA Abd: Soft, obese, nontender, bowel sounds normal Ext: trace edema b/l LEs Neuro: awake, alert Dialysis access: none  Imaging: DG CHEST PORT 1 VIEW  Result Date: 11/22/2021 CLINICAL DATA:  Status post thoracentesis. EXAM: PORTABLE CHEST 1 VIEW COMPARISON:  November 22, 2021. FINDINGS: Stable cardiomegaly. Status post coronary bypass graft. Left internal jugular and right-sided PICC line are unchanged in position. No pneumothorax is noted. No significant pleural effusion is noted. Mild left suprahilar opacity is noted concerning for atelectasis or possibly infiltrate. Bony thorax is unremarkable. IMPRESSION: Mild left suprahilar opacity is noted concerning for atelectasis or possibly infiltrate. Electronically Signed   By: Marijo Conception M.D.   On: 11/22/2021 14:01   DG CHEST PORT 1 VIEW  Result Date: 11/22/2021 CLINICAL DATA:  Pleural effusion. EXAM: PORTABLE CHEST 1 VIEW COMPARISON:  November 21, 2021. FINDINGS: Stable cardiomegaly. Right-sided PICC line and left internal jugular catheter unchanged in position. Right lung is clear. Stable left basilar opacity is noted suggesting atelectasis and probable  associated pleural effusion. Bony thorax is unremarkable. IMPRESSION: Stable left basilar opacity is noted suggesting atelectasis and probable associated pleural effusion. Electronically Signed   By: Marijo Conception M.D.   On: 11/22/2021 07:59   DG CHEST PORT 1 VIEW  Result Date: 11/21/2021 CLINICAL DATA:  Shortness of breath chest pain. EXAM:  PORTABLE CHEST 1 VIEW COMPARISON:  Chest radiograph performed earlier on the same day FINDINGS: The heart is enlarged. Evidence of prior coronary artery bypass grafting. Left IJ access catheter with distal tip in the SVC. Right access PICC with distal tip in the SVC. Mild elevation of the left hemidiaphragm with left basilar opacity which may represent small effusion/atelectasis, unchanged. IMPRESSION: Interval placement of left IJ access central line. Stable cardiomegaly with left basilar opacity suggesting effusion/atelectasis, unchanged. Electronically Signed   By: Keane Police D.O.   On: 11/21/2021 16:32   ECHO TEE  Result Date: 11/21/2021    TRANSESOPHOGEAL ECHO REPORT   Patient Name:   [name] [name] Date of Exam: 11/21/2021 Medical Rec #:  [id number]    Height:       70.0 in Accession #:    [id number]   Weight:       257.6 lb Date of Birth:  1956-01-04   BSA:          2.324 m Patient Age:    71 years     BP:           97/60 mmHg Patient Gender: M            HR:           83 bpm. Exam Location:  Inpatient Procedure: Transesophageal Echo, Color Doppler and Saline Contrast Bubble Study Indications:     I48.91* Unspeicified atrial fibrillation  History:         Patient has prior history of Echocardiogram examinations, most                  recent 11/13/2021. CHF, CAD, Arrythmias:Bradycardia and Atrial                  Fibrillation; Risk Factors:Dyslipidemia, Hypertension and                  Diabetes.  Sonographer:     Bernadene Person RDCS Referring Phys:  [place] Diagnosing Phys: Franki Monte PROCEDURE: After discussion of the risks and benefits of a TEE, an informed consent was obtained from the patient. The transesophogeal probe was passed without difficulty through the esophogus of the patient. Sedation performed by different physician. The patient was monitored while under deep sedation. Anesthestetic sedation was provided intravenously by Anesthesiology: 56.28mg  of Propofol. The patient's  vital signs; including heart rate, blood pressure, and oxygen saturation; remained stable throughout the procedure. The patient developed no complications during the procedure. A successful direct current cardioversion was performed at 200 joules with 1 attempt. IMPRESSIONS  1. Left ventricular ejection fraction, by estimation, is 55 to 60%. The left ventricle has normal function. The left ventricle has no regional wall motion abnormalities but there is septal bounce suggestive of prior cardiac surgery.  2. Right ventricular systolic function is mildly reduced. The right ventricular size is normal. Tricuspid regurgitation signal is inadequate for assessing PA pressure.  3. Left atrial size was mildly dilated. No left atrial/left atrial appendage thrombus was detected.  4. There is a PFO by color doppler and bubble study.  5. The mitral valve is normal in structure. Mild mitral  valve regurgitation. No evidence of mitral stenosis.  6. The aortic valve is tricuspid. There is mild calcification of the aortic valve. Aortic valve regurgitation is not visualized. No aortic stenosis is present.  7. A small pericardial effusion is present, primarily towards the apex. FINDINGS  Left Ventricle: Left ventricular ejection fraction, by estimation, is 55 to 60%. The left ventricle has normal function. The left ventricle has no regional wall motion abnormalities. The left ventricular internal cavity size was normal in size. There is  no left ventricular hypertrophy. Right Ventricle: The right ventricular size is normal. No increase in right ventricular wall thickness. Right ventricular systolic function is mildly reduced. Tricuspid regurgitation signal is inadequate for assessing PA pressure. Left Atrium: Left atrial size was mildly dilated. No left atrial/left atrial appendage thrombus was detected. Right Atrium: Right atrial size was normal in size. Pericardium: A small pericardial effusion is present. Mitral Valve: The mitral  valve is normal in structure. Mild mitral valve regurgitation. No evidence of mitral valve stenosis. Tricuspid Valve: The tricuspid valve is normal in structure. Tricuspid valve regurgitation is trivial. Aortic Valve: The aortic valve is tricuspid. There is mild calcification of the aortic valve. Aortic valve regurgitation is not visualized. No aortic stenosis is present. Pulmonic Valve: The pulmonic valve was normal in structure. Pulmonic valve regurgitation is not visualized. Aorta: The aortic root is normal in size and structure. IAS/Shunts: There is a PFO by color doppler and bubble study. Agitated saline contrast was given intravenously to evaluate for intracardiac shunting. [place] [facility] Electronically signed by Franki Monte Signature Date/Time: 11/21/2021/3:32:50 PM    Final     Labs: BMET Recent Labs  Lab 11/20/21 2774 11/20/21 1519 11/21/21 0501 11/21/21 1848 11/22/21 0455 11/22/21 1628 11/23/21 0434  NA 135 134* 134* 130* 133* 132* 131*  K 4.1 4.3 4.1 4.4 3.7 4.3 4.2  CL 100 101 99 96* 99 99 96*  CO2 22 22 21* 17* 24 19* 20*  GLUCOSE 131* 220* 123* 279* 72 278* 101*  BUN 28* 37* 52* 62* 40* 50* 60*  CREATININE 2.32* 3.21* 4.74* 5.54* 4.00* 4.86* 5.71*  CALCIUM 8.4* 8.4* 8.6* 8.1* 8.3* 7.5* 8.2*  PHOS 3.4 4.1 6.1* 7.3* 5.2* 6.1* 7.6*   CBC Recent Labs  Lab 11/20/21 0409 11/21/21 0501 11/22/21 0455 11/23/21 0434  WBC 17.1* 19.8* 22.2* 24.7*  HGB 9.1* 8.7* 8.4* 7.9*  HCT 29.3* 27.9* 26.7* 23.9*  MCV 96.1 95.5 94.7 94.1  PLT 246 269 253 248    Medications:     aspirin EC  325 mg Oral Daily   Or   aspirin  324 mg Per Tube Daily   bisacodyl  10 mg Rectal Q0600   Chlorhexidine Gluconate Cloth  6 each Topical Daily   Chlorhexidine Gluconate Cloth  6 each Topical Q0600   docusate sodium  200 mg Oral Daily   guaiFENesin  1,200 mg Oral BID   insulin aspart  0-24 Units Subcutaneous Q4H   insulin detemir  16 Units Subcutaneous Q12H   lactulose  10 g Oral Daily    lidocaine  2 patch Transdermal Q24H   metoCLOPramide (REGLAN) injection  5 mg Intravenous Q8H   midodrine  15 mg Oral TID WC   multivitamin  1 tablet Oral QHS   pantoprazole  40 mg Oral Daily   rosuvastatin  20 mg Oral Daily   sodium chloride flush  3 mL Intravenous Q12H   Gean Quint, MD [facility] 11/23/2021, 9:06 AM

## 2021-11-23 NOTE — Progress Notes (Signed)
Subjective:  Main complaint is back pain radiating down left leg. He did walk 190 ft this morning.  Objective: Vital signs in last 24 hours: Temp:  [97.1 F (36.2 C)-98.7 F (37.1 C)] 98.7 F (37.1 C) (06/03 0752) Pulse Rate:  [75-98] 84 (06/03 1000) Cardiac Rhythm: Normal sinus rhythm (06/03 0800) Resp:  [13-33] 16 (06/03 1000) BP: (84-120)/(39-88) 120/58 (06/03 1000) SpO2:  [90 %-100 %] 94 % (06/03 1000) FiO2 (%):  [50 %] 50 % (06/03 0400) Weight:  [121.1 kg] 121.1 kg (06/03 0500)  Hemodynamic parameters for last 24 hours: CVP:  [10 mmHg-32 mmHg] 22 mmHg  Intake/Output from previous day: 06/02 0701 - 06/03 0700 In: 1711.5 [P.O.:765; I.V.:383.4; IV Piggyback:563.1] Out: 2300  Intake/Output this shift: Total I/O In: 926.7 [P.O.:240; I.V.:571.7; IV Piggyback:115] Out: -   General appearance: alert and cooperative Neurologic: intact Heart: regular rate and rhythm, S1, S2 normal, no murmur Lungs: clear to auscultation bilaterally Extremities: edema minimal Wound: incisions healing well  Lab Results: Recent Labs    11/22/21 0455 11/23/21 0434  WBC 22.2* 24.7*  HGB 8.4* 7.9*  HCT 26.7* 23.9*  PLT 253 248   BMET:  Recent Labs    11/22/21 1628 11/23/21 0434  NA 132* 131*  K 4.3 4.2  CL 99 96*  CO2 19* 20*  GLUCOSE 278* 101*  BUN 50* 60*  CREATININE 4.86* 5.71*  CALCIUM 7.5* 8.2*    PT/INR: No results for input(s): LABPROT, INR in the last 72 hours. ABG    Component Value Date/Time   PHART 7.332 (L) 11/15/2021 0410   HCO3 24.2 11/16/2021 0414   TCO2 26 11/16/2021 0414   ACIDBASEDEF 3.0 (H) 11/16/2021 0414   O2SAT 56.1 11/21/2021 0612   CBG (last 3)  Recent Labs    11/23/21 0026 11/23/21 0425 11/23/21 0750  GLUCAP 161* 103* 174*   CXR looks good.  Assessment/Plan:  Hemodynamically stable in rate controlled atrial fibrillation on IV amio.  CVP 12-13 after 2300 cc volume removal last pm.  AKI on stage 3 CKD requiring HD. No signs of recovery  so far. Planning HD Monday.   WBC continues to rise up to 25K today. He remains afebrile with CXR looking good, new lines. Urine culture pending.  Encouraged to continue getting up walk despite leg pain.  CCM and Heart failure following.   LOS: 11 days    Gaye Pollack 11/23/2021

## 2021-11-23 NOTE — Progress Notes (Signed)
Patient ID: [name] [name], male   DOB: [birth year], 66 y.o.   MRN: [id number]  TCTS Evening Rounds:  Hemodynamically stable  Remains in rate-controlled AF in 70's on IV amio.  Ambulated 1 block today.  BMET    Component Value Date/Time   NA 128 (L) 11/23/2021 1613   NA 139 10/21/2021 1547   K 4.6 11/23/2021 1613   CL 93 (L) 11/23/2021 1613   CO2 18 (L) 11/23/2021 1613   GLUCOSE 267 (H) 11/23/2021 1613   BUN 69 (H) 11/23/2021 1613   BUN 29 (H) 10/21/2021 1547   CREATININE 6.36 (H) 11/23/2021 1613   CALCIUM 8.2 (L) 11/23/2021 1613   EGFR 49 (L) 10/21/2021 1547   GFRNONAA 9 (L) 11/23/2021 1613

## 2021-11-23 NOTE — Progress Notes (Signed)
Patient ID: [name] [name], male   DOB: December 18, 1955, 66 y.o.   MRN: [id number]     Advanced Heart Failure Rounding Note  PCP-Cardiologist: None   Subjective:    iHD yesterday, weight down 2 lbs.  CVP 12 today.  Left thoracentesis 6/2 with 900 cc off.   Abx started 5/25 for suspected septic shock.  Abx completed and afebrile, but WBCs up, 17>>19.8>>22.2>>25. CCM added rocephin 06/01 for possible aspiration. On 6/2, abx transitioned to cefepime and micafungin added (yeast in resp culture).   S/p TEE/DCCV to SR 06/01. Back in AF with rates 90s this am.  Main complaint today is back pain radiating to left leg.   Echo: Difficult study. LV EF 55-60%, RV probably normal, no pericardial effusion.    Objective:   Weight Range: 121.1 kg Body mass index is 38.31 kg/m.   Vital Signs:   Temp:  [97.1 F (36.2 C)-98.7 F (37.1 C)] 98.7 F (37.1 C) (06/03 0752) Pulse Rate:  [64-100] 96 (06/03 0700) Resp:  [13-33] 25 (06/03 0700) BP: (84-123)/(39-69) 97/62 (06/03 0700) SpO2:  [90 %-100 %] 94 % (06/03 0700) FiO2 (%):  [50 %] 50 % (06/03 0400) Weight:  [121.1 kg] 121.1 kg (06/03 0500) Last BM Date : 11/21/21  Weight change: Filed Weights   11/21/21 0500 11/22/21 0500 11/23/21 0500  Weight: 116.8 kg 122 kg 121.1 kg    Intake/Output:   Intake/Output Summary (Last 24 hours) at 11/23/2021 0817 Last data filed at 11/23/2021 0811 Gross per 24 hour  Intake 1820.71 ml  Output 2300 ml  Net -479.29 ml      Physical Exam   General: NAD Neck: JVP 12 cm, no thyromegaly or thyroid nodule.  Lungs: Decreased at bases.  CV: Nondisplaced PMI.  Heart irregular S1/S2, no S3/S4, no murmur.  No peripheral edema.   Abdomen: Soft, nontender, no hepatosplenomegaly, mild distention.  Skin: Intact without lesions or rashes.  Neurologic: Alert and oriented x 3.  Psych: Normal affect. Extremities: No clubbing or cyanosis.  HEENT: Normal.   Telemetry   Afib 90s (personally reviewed)   Labs     CBC Recent Labs    11/22/21 0455 11/23/21 0434  WBC 22.2* 24.7*  HGB 8.4* 7.9*  HCT 26.7* 23.9*  MCV 94.7 94.1  PLT 253 945   Basic Metabolic Panel Recent Labs    11/22/21 0455 11/22/21 1628 11/23/21 0434  NA 133* 132* 131*  K 3.7 4.3 4.2  CL 99 99 96*  CO2 24 19* 20*  GLUCOSE 72 278* 101*  BUN 40* 50* 60*  CREATININE 4.00* 4.86* 5.71*  CALCIUM 8.3* 7.5* 8.2*  MG 2.2  --  2.3  PHOS 5.2* 6.1* 7.6*   Liver Function Tests Recent Labs    11/22/21 1628 11/22/21 2239 11/23/21 0434  PROT  --  6.6  --   ALBUMIN 2.6*  --  3.0*   No results for input(s): LIPASE, AMYLASE in the last 72 hours. Cardiac Enzymes No results for input(s): CKTOTAL, CKMB, CKMBINDEX, TROPONINI in the last 72 hours.  BNP: BNP (last 3 results) No results for input(s): BNP in the last 8760 hours.  ProBNP (last 3 results) No results for input(s): PROBNP in the last 8760 hours.   D-Dimer No results for input(s): DDIMER in the last 72 hours. Hemoglobin A1C No results for input(s): HGBA1C in the last 72 hours. Fasting Lipid Panel No results for input(s): CHOL, HDL, LDLCALC, TRIG, CHOLHDL, LDLDIRECT in the last 72 hours. Thyroid Function Tests No  results for input(s): TSH, T4TOTAL, T3FREE, THYROIDAB in the last 72 hours.  Invalid input(s): FREET3  Other results:   Imaging    DG CHEST PORT 1 VIEW  Result Date: 11/22/2021 CLINICAL DATA:  Status post thoracentesis. EXAM: PORTABLE CHEST 1 VIEW COMPARISON:  November 22, 2021. FINDINGS: Stable cardiomegaly. Status post coronary bypass graft. Left internal jugular and right-sided PICC line are unchanged in position. No pneumothorax is noted. No significant pleural effusion is noted. Mild left suprahilar opacity is noted concerning for atelectasis or possibly infiltrate. Bony thorax is unremarkable. IMPRESSION: Mild left suprahilar opacity is noted concerning for atelectasis or possibly infiltrate. Electronically Signed   By: Marijo Conception M.D.   On:  11/22/2021 14:01     Medications:     Scheduled Medications:  aspirin EC  325 mg Oral Daily   Or   aspirin  324 mg Per Tube Daily   bisacodyl  10 mg Rectal Q0600   Chlorhexidine Gluconate Cloth  6 each Topical Daily   Chlorhexidine Gluconate Cloth  6 each Topical Q0600   docusate sodium  200 mg Oral Daily   guaiFENesin  1,200 mg Oral BID   insulin aspart  0-24 Units Subcutaneous Q4H   insulin detemir  16 Units Subcutaneous Q12H   lactulose  10 g Oral Daily   lidocaine  2 patch Transdermal Q24H   metoCLOPramide (REGLAN) injection  5 mg Intravenous Q8H   midodrine  15 mg Oral TID WC   multivitamin  1 tablet Oral QHS   pantoprazole  40 mg Oral Daily   rosuvastatin  20 mg Oral Daily   sodium chloride flush  3 mL Intravenous Q12H    Infusions:  sodium chloride Stopped (11/14/21 0549)   sodium chloride     sodium chloride     amiodarone 30 mg/hr (11/23/21 0554)   anticoagulant sodium citrate     ceFEPime (MAXIPIME) IV     heparin 1,700 Units/hr (11/23/21 0701)   lactated ringers Stopped (11/18/21 2130)   lactated ringers Stopped (11/17/21 2054)   micafungin (MYCAMINE) IV Stopped (11/22/21 1039)    PRN Medications: sodium chloride, alteplase, anticoagulant sodium citrate, heparin, heparin, HYDROmorphone (DILAUDID) injection, ipratropium-albuterol, lidocaine (PF), lidocaine-prilocaine, ondansetron (ZOFRAN) IV, oxyCODONE, pentafluoroprop-tetrafluoroeth, sodium chloride, sodium chloride flush    Patient Profile   66 y.o. male with history of CM with recovered EF, persistent atrial fibrillation, DM II, CKD, sick sinus syndrome, OSA, prior smoker. Underwent CABG X 3 and MAZE/PVI on 05/23>>post-op decrease in UOP and AKI with volume overload>>postcardiotomy shock requiring inotrope and pressor support  Assessment/Plan   1. Shock: Echo reviewed, LV EF 55-60%, RV poorly visualized but probably near-normal, no significant pericardial effusion. Suspect sepsis physiology with  distributive shock.  Improved with broad spectrum abx. Lactate has cleared. Bcx 5/25 remain NGTD. Now off pressors/resolved.   - Continue midodrine to 15 mg TID 2. Acute on chronic primarily diastolic CHF: LV EF 72-09%, RV poorly visualized but probably near-normal, no significant pericardial effusion.  Patient is still volume overloaded, suspect primarily right heart failure physiology.  CVVHD started on 5/26. Now getting iHD, last session yesterday.  CVP 12-13 today.   - Discussed with nephrology, will get HD today. 3. AKI on CKD stage 3: Baseline SCr 1.3-1.5 .  Suspect due to shock/ATN as well as elevated renal venous pressure in setting of volume overload, ileus, abdominal distention. CVVHD started 5/26. Remains nearly anuric.  Lost HD line 5/31, replaced 06/01. Now getting iHD.  4. CAD: S/p  CABG. No chest pain.  - Continue ASA, statin.  5. Atrial fibrillation with RVR: Has had Maze and LA appendage clip at time of CABG on 11/12/21. S/p TEE/DCCV to SR on 06//01. Back in AF by 6/2.  In AF with HR 90s today. - Continue heparin gtt - Continue amio gtt  30/hr - Plan for cardioversion again once more volume removed, likely 06/05.  6. DM2: SSI.  7. ID: WBCs 22>>16K > 14K -> 12.8 -> 16 -> 17 -> 19.8 -> 22 -> 25K.  Afebrile.  Septic shock initially, resolved.  Blood cultures NGTD.  Treated for HCAP, completed vanc and cefepime.  With WBCs rising again and concern for aspiration, he is back on cefepime and micafungin given yeast in sputum.  - Continue cefepime/micafungin.  8. Ileus: Improved.  9. Pulmonary: Respiratory status improving with fluid removal and abx. Thoracentesis on 6/2.  - HD for volume removal  - CCM following.    Continue to mobilize. PT/OT.  CRITICAL CARE Performed by: Loralie Champagne  Total critical care time: 35 minutes  Critical care time was exclusive of separately billable procedures and treating other patients.  Critical care was necessary to treat or prevent imminent or  life-threatening deterioration.  Critical care was time spent personally by me on the following activities: development of treatment plan with patient and/or surrogate as well as nursing, discussions with consultants, evaluation of patient's response to treatment, examination of patient, obtaining history from patient or surrogate, ordering and performing treatments and interventions, ordering and review of laboratory studies, ordering and review of radiographic studies, pulse oximetry and re-evaluation of patient's condition.  Length of Stay: [place], MD  11/23/2021, 8:17 AM  Advanced Heart Failure Team Pager [phone] (M-F; 7a - 5p)  Please contact [facility] for night-coverage after hours (5p -7a ) and weekends on [email or web address]

## 2021-11-24 DIAGNOSIS — I5031 Acute diastolic (congestive) heart failure: Secondary | ICD-10-CM | POA: Diagnosis not present

## 2021-11-24 DIAGNOSIS — Z951 Presence of aortocoronary bypass graft: Secondary | ICD-10-CM | POA: Diagnosis not present

## 2021-11-24 LAB — RENAL FUNCTION PANEL
Albumin: 3.4 g/dL — ABNORMAL LOW (ref 3.5–5.0)
Albumin: 3.1 g/dL — ABNORMAL LOW (ref 3.5–5.0)
Anion gap: 13 (ref 5–15)
Anion gap: 15 (ref 5–15)
BUN: 33 mg/dL — ABNORMAL HIGH (ref 8–23)
BUN: 46 mg/dL — ABNORMAL HIGH (ref 8–23)
CO2: 27 mmol/L (ref 22–32)
CO2: 24 mmol/L (ref 22–32)
Calcium: 8.6 mg/dL — ABNORMAL LOW (ref 8.9–10.3)
Calcium: 8.7 mg/dL — ABNORMAL LOW (ref 8.9–10.3)
Chloride: 94 mmol/L — ABNORMAL LOW (ref 98–111)
Chloride: 94 mmol/L — ABNORMAL LOW (ref 98–111)
Creatinine, Ser: 3.38 mg/dL — ABNORMAL HIGH (ref 0.61–1.24)
Creatinine, Ser: 4.99 mg/dL — ABNORMAL HIGH (ref 0.61–1.24)
GFR, Estimated: 19 mL/min — ABNORMAL LOW (ref >60)
GFR, Estimated: 12 mL/min — ABNORMAL LOW (ref >60)
Glucose, Bld: 112 mg/dL — ABNORMAL HIGH (ref 70–99)
Glucose, Bld: 126 mg/dL — ABNORMAL HIGH (ref 70–99)
Phosphorus: 3.3 mg/dL (ref 2.5–4.6)
Phosphorus: 5.2 mg/dL — ABNORMAL HIGH (ref 2.5–4.6)
Potassium: 3.8 mmol/L (ref 3.5–5.1)
Potassium: 4.3 mmol/L (ref 3.5–5.1)
Sodium: 134 mmol/L — ABNORMAL LOW (ref 135–145)
Sodium: 133 mmol/L — ABNORMAL LOW (ref 135–145)

## 2021-11-24 LAB — URINE CULTURE: Culture: NO GROWTH

## 2021-11-24 LAB — GLUCOSE, CAPILLARY
Glucose-Capillary: 107 mg/dL — ABNORMAL HIGH (ref 70–99)
Glucose-Capillary: 121 mg/dL — ABNORMAL HIGH (ref 70–99)
Glucose-Capillary: 168 mg/dL — ABNORMAL HIGH (ref 70–99)
Glucose-Capillary: 191 mg/dL — ABNORMAL HIGH (ref 70–99)
Glucose-Capillary: 290 mg/dL — ABNORMAL HIGH (ref 70–99)
Glucose-Capillary: 313 mg/dL — ABNORMAL HIGH (ref 70–99)

## 2021-11-24 LAB — CBC
HCT: 24.3 % — ABNORMAL LOW (ref 39.0–52.0)
Hemoglobin: 8 g/dL — ABNORMAL LOW (ref 13.0–17.0)
MCH: 30.3 pg (ref 26.0–34.0)
MCHC: 32.9 g/dL (ref 30.0–36.0)
MCV: 92 fL (ref 80.0–100.0)
Platelets: 293 10*3/uL (ref 150–400)
RBC: 2.64 MIL/uL — ABNORMAL LOW (ref 4.22–5.81)
RDW: 15.4 % (ref 11.5–15.5)
WBC: 23.1 10*3/uL — ABNORMAL HIGH (ref 4.0–10.5)
nRBC: 0 % (ref 0.0–0.2)

## 2021-11-24 LAB — MAGNESIUM: Magnesium: 1.8 mg/dL (ref 1.7–2.4)

## 2021-11-24 LAB — HEPARIN LEVEL (UNFRACTIONATED): Heparin Unfractionated: 0.4 IU/mL (ref 0.30–0.70)

## 2021-11-24 MED ORDER — INSULIN ASPART 100 UNIT/ML IJ SOLN
0.0000 [IU] | Freq: Three times a day (TID) | INTRAMUSCULAR | Status: DC
  Administered 2021-11-24: 16 [IU] via SUBCUTANEOUS
  Administered 2021-11-25: 8 [IU] via SUBCUTANEOUS
  Administered 2021-11-25: 12 [IU] via SUBCUTANEOUS
  Administered 2021-11-26: 8 [IU] via SUBCUTANEOUS
  Administered 2021-11-26: 4 [IU] via SUBCUTANEOUS
  Administered 2021-11-26: 3 [IU] via SUBCUTANEOUS
  Administered 2021-11-27 (×2): 4 [IU] via SUBCUTANEOUS
  Administered 2021-11-27 – 2021-11-28 (×2): 8 [IU] via SUBCUTANEOUS
  Administered 2021-11-28: 12 [IU] via SUBCUTANEOUS
  Administered 2021-11-28: 8 [IU] via SUBCUTANEOUS
  Administered 2021-11-29: 20 [IU] via SUBCUTANEOUS
  Administered 2021-11-29: 16 [IU] via SUBCUTANEOUS
  Administered 2021-11-30: 8 [IU] via SUBCUTANEOUS
  Administered 2021-11-30: 16 [IU] via SUBCUTANEOUS
  Administered 2021-11-30 – 2021-12-01 (×2): 4 [IU] via SUBCUTANEOUS
  Administered 2021-12-01: 12 [IU] via SUBCUTANEOUS
  Administered 2021-12-01: 8 [IU] via SUBCUTANEOUS
  Administered 2021-12-02: 20 [IU] via SUBCUTANEOUS
  Administered 2021-12-02: 2 [IU] via SUBCUTANEOUS
  Administered 2021-12-02: 8 [IU] via SUBCUTANEOUS
  Administered 2021-12-03 (×2): 12 [IU] via SUBCUTANEOUS
  Administered 2021-12-03: 4 [IU] via SUBCUTANEOUS
  Administered 2021-12-04: 3 [IU] via SUBCUTANEOUS
  Administered 2021-12-04: 8 [IU] via SUBCUTANEOUS

## 2021-11-24 MED ORDER — ACETAMINOPHEN 325 MG PO TABS: 650.0000 mg | ORAL_TABLET | Freq: Four times a day (QID) | ORAL | Status: DC | PRN

## 2021-11-24 MED ORDER — TRAMADOL HCL 50 MG PO TABS
50.0000 mg | ORAL_TABLET | Freq: Two times a day (BID) | ORAL | Status: DC | PRN
  Administered 2021-11-27 – 2021-11-30 (×2): 50 mg via ORAL
  Filled 2021-11-24 (×2): qty 1

## 2021-11-24 MED ORDER — INSULIN ASPART 100 UNIT/ML IJ SOLN: 0.0000 [IU] | Freq: Three times a day (TID) | INTRAMUSCULAR | Status: DC

## 2021-11-24 MED ORDER — ALBUMIN HUMAN 25 % IV SOLN: 12.5000 g | Freq: Once | INTRAVENOUS | Status: AC

## 2021-11-24 MED ORDER — MIDODRINE HCL 5 MG PO TABS: 15.0000 mg | ORAL_TABLET | Freq: Once | ORAL | Status: DC

## 2021-11-24 MED ORDER — SODIUM CHLORIDE 0.45 % IV SOLN: INTRAVENOUS | Status: DC

## 2021-11-24 MED ORDER — VANCOMYCIN HCL IN DEXTROSE 1-5 GM/200ML-% IV SOLN
1000.0000 mg | INTRAVENOUS | Status: DC
  Administered 2021-11-25: 1000 mg via INTRAVENOUS

## 2021-11-24 MED ORDER — DARBEPOETIN ALFA 100 MCG/0.5ML IJ SOSY
100.0000 ug | PREFILLED_SYRINGE | INTRAMUSCULAR | Status: DC
  Administered 2021-11-25: 100 ug via INTRAVENOUS
  Filled 2021-11-24 (×2): qty 0.5

## 2021-11-24 MED ORDER — ALBUMIN HUMAN 25 % IV SOLN
12.5000 g | Freq: Once | INTRAVENOUS | Status: AC
Start: 2021-11-24 — End: 2021-11-24

## 2021-11-24 MED ORDER — ALBUMIN HUMAN 25 % IV SOLN
INTRAVENOUS | Status: AC
  Administered 2021-11-24: 12.5 g via INTRAVENOUS
  Filled 2021-11-24: qty 100

## 2021-11-24 NOTE — Progress Notes (Signed)
   NAME:  [name] [name], MRN:  [id number], DOB:  1956-05-24, LOS: 12 ADMISSION DATE:  11/12/2021, CONSULTATION DATE:  5/24 REFERRING MD:  Aundra Dubin, CHIEF COMPLAINT:  Dyspnea   History of Present Illness:  66 y/o male with multiple medical problems admitted for CABG and MAZE on 1/19 complicated by acute hypoxemic respiratory failure and renal failure requiring hemodialysis.  Pertinent  Medical History  CAD no s/p CABG, systolic congestive heart failure, CKD, type 2 diabetes, HTN, A-fib chronically anticoagulated, and OSA  Significant Hospital Events: Including procedures, antibiotic start and stop dates in addition to other pertinent events   5/23 underwent CABG with MAZE procedure with Dr. Lawson Fiscal 5/24 PCCM consulted for progressive increased work of breathing  5/25 Urine output remained low overnight along with soft BP which prompted addition of vaso and low dose bicarb drip  5/26 CRRT started 5/31 CRRT stopped with dislodged catheter.   6/1 Cardioversion.  On RA, off vasopressors. New HD cath placed. 6/2 left thoracentesis, 900 cc fluid removed: exudative, LDH 544, 707 WBC (lymph, PMN, Monocytes essentially all the same); antibiotics broadened given worsening leukocytosis  Interim History / Subjective:   Afebrile WBC stable Had HD from 2AM - 5:30 AM Back pain has improved   Objective   Blood pressure 99/62, pulse 90, temperature (!) 97.5 F (36.4 C), temperature source Axillary, resp. rate 13, height 5\' 10"  (1.778 m), weight 121.1 kg, SpO2 91 %. CVP:  [8 mmHg-32 mmHg] 16 mmHg      Intake/Output Summary (Last 24 hours) at 11/24/2021 0724 Last data filed at 11/24/2021 0530 Gross per 24 hour  Intake 1843.72 ml  Output 3000 ml  Net -1156.28 ml   Filed Weights   11/21/21 0500 11/22/21 0500 11/23/21 0500  Weight: 116.8 kg 122 kg 121.1 kg    Examination:  General:  Resting comfortably in bed HENT: NCAT OP clear PULM: CTA B, normal effort CV: RRR, no mgr GI: BS+, soft,  nontender MSK: normal bulk and tone Neuro: awake, alert, no distress, MAEW    Resolved Hospital Problem list   Acute hypoxic respiratory failure Constipation Ileus    Assessment & Plan:  CAD s/p CABG 5/23 Tele Management per TCTS/heart failure  Cardiogenic and septic shock > resolved Acute on chronic HFrEF Management per advanced heart failure  Chronic atrial fibrillation s/p MAZE and LAA appendage clip Tele Amiodarone per heart failure Cardioversion per heart failure  L pleural effusion> reactive post op CXR prn  Leukocytosis> unclear etiology; may have HCAP but normal oxygenation and CXR reassuring; lines all new, no foley Continue current antibiotics for now Monitor WBC count Likely narrow antibiotics on 6/5   OSA Bi-PAP nightly  DM2 SSI to continue  Oliguric AKI iHD per Renal team Monitor BMET and UOP Replace electrolytes as needed   Best Practice (right click and "Reselect all SmartList Selections" daily)   Per primary   Roselie Awkward, MD [place] [facility] Pager: [phone] Cell: [phone] After 7:00 pm call Elink  [phone]

## 2021-11-24 NOTE — Progress Notes (Signed)
Patient ID: [name] [name], male   DOB: May 08, 1956, 66 y.o.   MRN: [id number] [facility] Progress Note   Assessment/ Plan:   1.  Acute kidney injury on chronic kidney disease stage IIIa:  -AKI likely ischemic ATN in the setting of septic versus cardiogenic shock.  Started on CRRT 5/26 for extracorporeal volume unloading after he had fixed urine output on Lasix drip that was inadequate for volume unloading. CRRT 5/26-5/31 -unable to receive [facility] given leukocytosis/infection. New temp line placed 6/1 (previous line dislodged).  -Next HD planned for tomorrow, will likely maintain on MWF schedule thereafter -no evidence of renal recovery at this junction, will need his HD catheter converted to [facility] down the road  2.  Coronary artery disease status post three-vessel CABG:  -on aspirin/statin. 3. Acute on chronic dCHF -UF as tolerated with HD, volume status improving -milrinone stopped 5/29 4. Shock -possibly from sepsis+cardiogenic. Abx/pressor support per primary service. Sepsis w/u per CCM -off pressors and on midodrine now 5.  Atrial fibrillation:  -Status post MAZE procedure at the time of CABG. Per primary service, on amio. S/p tee-dccv 6/1 -planning for another cardioversion once more volume has been removed, possibly 6/5 6.  Anemia:  -Likely associated with recent surgery and postoperative inflammatory response.  transfuse PRN for hgb <7 -iron panel=iron deficient but will hold off on adding Fe given that he is receiving abx and there is no clearcut source of infection. ESA to start 6/5 7.  Hyponatremia -Managing with HD and UF, improved 8. Left Pleural Effusion -s/p thora 6/2, 900cc removed  Discussed w/ primary service   Subjective:   Patient seen and examined.  Tolerated HD overnight. S/p 3L net UF. He reports that his back pain/leg weakness is slightly better. Breathing is also reported to be slightly better. Remains off pressors CVP down to 7 today Anuric    Objective:   BP 99/62 (BP Location: Right Arm)   Pulse 90   Temp (!) 97.5 F (36.4 C) (Axillary)   Resp 13   Ht 5\' 10"  (1.778 m)   Wt 121.1 kg   SpO2 91%   BMI 38.31 kg/m   Intake/Output Summary (Last 24 hours) at 11/24/2021 7062 Last data filed at 11/24/2021 0530 Gross per 24 hour  Intake 1843.72 ml  Output 3000 ml  Net -1156.28 ml   Weight change:   Physical Exam: Gen: NAD, sitting up in bed eating breakfast CVS: irregular, S1 and S2 normal, clean midline sternotomy incision Resp: slightly diminished air entry bibasilar, no w/r/r/c, normal WOB Abd: Soft, obese, nontender, bowel sounds normal Ext: trace edema b/l LEs Neuro: awake, alert Dialysis access: LIJ temp line c/d/i  Imaging: DG Chest Port 1 View  Result Date: 11/23/2021 CLINICAL DATA:  CABG EXAM: PORTABLE CHEST 1 VIEW COMPARISON:  11/22/2021 FINDINGS: Right-sided PICC line and left IJ central venous catheter are stable in positioning. Prior sternotomy and CABG. Stable cardiomegaly. Previously seen left suprahilar opacity is improving compared to prior. No new focal airspace consolidation. No large pleural fluid collection. No pneumothorax. IMPRESSION: Improving left suprahilar opacity.  Otherwise stable chest. Electronically Signed   By: Davina Poke D.O.   On: 11/23/2021 09:15   DG CHEST PORT 1 VIEW  Result Date: 11/22/2021 CLINICAL DATA:  Status post thoracentesis. EXAM: PORTABLE CHEST 1 VIEW COMPARISON:  November 22, 2021. FINDINGS: Stable cardiomegaly. Status post coronary bypass graft. Left internal jugular and right-sided PICC line are unchanged in position. No pneumothorax is noted. No significant pleural effusion  is noted. Mild left suprahilar opacity is noted concerning for atelectasis or possibly infiltrate. Bony thorax is unremarkable. IMPRESSION: Mild left suprahilar opacity is noted concerning for atelectasis or possibly infiltrate. Electronically Signed   By: Marijo Conception M.D.   On: 11/22/2021 14:01     Labs: BMET Recent Labs  Lab 11/21/21 0501 11/21/21 1848 11/22/21 0455 11/22/21 1628 11/23/21 0434 11/23/21 1613 11/24/21 0455  NA 134* 130* 133* 132* 131* 128* 134*  K 4.1 4.4 3.7 4.3 4.2 4.6 3.8  CL 99 96* 99 99 96* 93* 94*  CO2 21* 17* 24 19* 20* 18* 27  GLUCOSE 123* 279* 72 278* 101* 267* 112*  BUN 52* 62* 40* 50* 60* 69* 33*  CREATININE 4.74* 5.54* 4.00* 4.86* 5.71* 6.36* 3.38*  CALCIUM 8.6* 8.1* 8.3* 7.5* 8.2* 8.2* 8.6*  PHOS 6.1* 7.3* 5.2* 6.1* 7.6* 7.7* 3.3   CBC Recent Labs  Lab 11/21/21 0501 11/22/21 0455 11/23/21 0434 11/24/21 0455  WBC 19.8* 22.2* 24.7* 23.1*  HGB 8.7* 8.4* 7.9* 8.0*  HCT 27.9* 26.7* 23.9* 24.3*  MCV 95.5 94.7 94.1 92.0  PLT 269 253 248 293    Medications:     aspirin EC  325 mg Oral Daily   Or   aspirin  324 mg Per Tube Daily   bisacodyl  10 mg Rectal Q0600   Chlorhexidine Gluconate Cloth  6 each Topical Daily   Chlorhexidine Gluconate Cloth  6 each Topical Q0600   docusate sodium  200 mg Oral Daily   guaiFENesin  1,200 mg Oral BID   insulin aspart  0-24 Units Subcutaneous Q4H   insulin detemir  16 Units Subcutaneous Q12H   lactulose  10 g Oral Daily   lidocaine  2 patch Transdermal Q24H   metoCLOPramide (REGLAN) injection  5 mg Intravenous Q8H   midodrine  15 mg Oral TID WC   midodrine  15 mg Oral Once   multivitamin  1 tablet Oral QHS   pantoprazole  40 mg Oral Daily   rosuvastatin  20 mg Oral Daily   sodium chloride flush  3 mL Intravenous Q12H   vancomycin variable dose per unstable renal function (pharmacist dosing)   Does not apply See admin instructions   Gean Quint, MD [facility] 11/24/2021, 7:52 AM

## 2021-11-24 NOTE — Progress Notes (Signed)
Patient ID: [name] [name], male   DOB: 15-Oct-1955, 66 y.o.   MRN: [id number] TCTS Evening Rounds:  Hemodynamically stable in atrial fib.   Only ambulated once this am and slept part of the day since he was up all night on HD.  Planning DCCV in am.

## 2021-11-24 NOTE — Progress Notes (Signed)
[place] for IV heparin Indication: atrial fibrillation  No Known Allergies  Patient Measurements: Height: 5\' 10"  (177.8 cm) Weight: 121.1 kg (266 lb 15.6 oz) IBW/kg (Calculated) : 73 Heparin Dosing Weight: ~ 100 kg  Vital Signs: Temp: 97.5 F (36.4 C) (06/04 0415) Temp Source: Axillary (06/04 0415) BP: 99/62 (06/04 0600) Pulse Rate: 90 (06/04 0600)  Labs: Recent Labs    11/22/21 0455 11/22/21 1628 11/23/21 0434 11/23/21 1613 11/24/21 0455  HGB 8.4*  --  7.9*  --  8.0*  HCT 26.7*  --  23.9*  --  24.3*  PLT 253  --  248  --  293  HEPARINUNFRC 0.65  --  0.42  --  0.40  CREATININE 4.00*   < > 5.71* 6.36* 3.38*   < > = values in this interval not displayed.     Estimated Creatinine Clearance: 27.7 mL/min (A) (by C-G formula based on SCr of 3.38 mg/dL (H)).   Medical History: Past Medical History:  Diagnosis Date   Atrial fibrillation ([employer])    Benign essential HTN 07/30/2014   Bradycardia 01/25/2015   CHF (congestive heart failure) (HCC)    Chronic anticoagulation 38/45/3646   Chronic systolic CHF (congestive heart failure), NYHA class 3 ([place]) 08/30/2014   Overview:  Global ef 30%   CKD (chronic kidney disease)    Class 3 severe obesity in adult ([place]) 04/28/2017   Coronary artery disease    Diabetes mellitus without complication (HCC)    Diabetic neuropathy ([place]) 07/28/2014   Dysrhythmia    Erectile dysfunction    High risk medication use 05/26/2018   Hypertension    Hypertensive heart disease with heart failure ([place]) 07/30/2014   Lumbago    LV dysfunction 04/28/2017   Mixed hyperlipidemia 07/28/2014   OSA (obstructive sleep apnea)    CPAP   Paroxysmal atrial fibrillation ([place]) 07/28/2014   Sinus node dysfunction ([place]) 04/21/2016   Testicular hypofunction    Uncontrolled type 2 diabetes mellitus with microalbuminuric diabetic nephropathy 07/28/2014   Assessment: 66 yo male on chronic Xarelto for afib.  Now s/p  CABG with MAZE, and on CRRT.  Remains in afib.  Pharmacy asked to dose IV heparin. Targeting lowering heparin level goal given recent OHS.  Heparin level remains therapeutic at 0.4, H/H and pltc stable.  Plans for DCCV on 6/5  Goal of Therapy:  Heparin level 0.3-0.5 Monitor platelets by anticoagulation protocol: Yes   Plan:  Continue heparin 1700 units/hr Daily heparin level and CBC  Hildred Laser, PharmD Clinical Pharmacist **Pharmacist phone directory can now be found on [email or web address] ([email or web address]).  Listed under [place].

## 2021-11-24 NOTE — Progress Notes (Signed)
Patient ID: [name] [name], male   DOB: Jun 11, 1956, 66 y.o.   MRN: [id number]     Advanced Heart Failure Rounding Note  PCP-Cardiologist: None   Subjective:    iHD yesterday, CVP 7 today.    Abx started 5/25 for suspected septic shock.  Initial abx completed and afebrile, but WBCs up, 17>>19.8>>22.2>>25>>23. CCM added rocephin 06/01 for possible aspiration. On 6/2, abx transitioned to vanc/cefepime and micafungin added (yeast in resp culture).   S/p TEE/DCCV to SR 06/01. Back in AF with rates 90s this am.  Main complaint today is back pain radiating to left leg. He was able to some walking yesterday.   Left thoracentesis 6/2 with 900 cc off.   Echo: Difficult study. LV EF 55-60%, RV probably normal, no pericardial effusion.    Objective:   Weight Range: 121.1 kg Body mass index is 38.31 kg/m.   Vital Signs:   Temp:  [97.4 F (36.3 C)-98.7 F (37.1 C)] 97.5 F (36.4 C) (06/04 0415) Pulse Rate:  [71-92] 90 (06/04 0600) Resp:  [12-27] 13 (06/04 0600) BP: (89-123)/(48-88) 99/62 (06/04 0600) SpO2:  [91 %-99 %] 91 % (06/04 0600) Last BM Date : 11/23/21  Weight change: Filed Weights   11/21/21 0500 11/22/21 0500 11/23/21 0500  Weight: 116.8 kg 122 kg 121.1 kg    Intake/Output:   Intake/Output Summary (Last 24 hours) at 11/24/2021 0749 Last data filed at 11/24/2021 0530 Gross per 24 hour  Intake 1843.72 ml  Output 3000 ml  Net -1156.28 ml      Physical Exam   General: NAD Neck: No JVD, no thyromegaly or thyroid nodule.  Lungs: Clear to auscultation bilaterally with normal respiratory effort. CV: Nondisplaced PMI.  Heart regular S1/S2, no S3/S4, no murmur.  No peripheral edema.   Abdomen: Soft, nontender, no hepatosplenomegaly, mild distention.  Skin: Intact without lesions or rashes.  Neurologic: Alert and oriented x 3.  Psych: Normal affect. Extremities: No clubbing or cyanosis.  HEENT: Normal.   Telemetry   Afib 90s (personally reviewed)   Labs     CBC Recent Labs    11/23/21 0434 11/24/21 0455  WBC 24.7* 23.1*  HGB 7.9* 8.0*  HCT 23.9* 24.3*  MCV 94.1 92.0  PLT 248 176   Basic Metabolic Panel Recent Labs    11/23/21 0434 11/23/21 1613 11/24/21 0455  NA 131* 128* 134*  K 4.2 4.6 3.8  CL 96* 93* 94*  CO2 20* 18* 27  GLUCOSE 101* 267* 112*  BUN 60* 69* 33*  CREATININE 5.71* 6.36* 3.38*  CALCIUM 8.2* 8.2* 8.6*  MG 2.3  --  1.8  PHOS 7.6* 7.7* 3.3   Liver Function Tests Recent Labs    11/22/21 2239 11/23/21 0434 11/23/21 1613 11/24/21 0455  PROT 6.6  --   --   --   ALBUMIN  --    < > 3.0* 3.4*   < > = values in this interval not displayed.   No results for input(s): LIPASE, AMYLASE in the last 72 hours. Cardiac Enzymes No results for input(s): CKTOTAL, CKMB, CKMBINDEX, TROPONINI in the last 72 hours.  BNP: BNP (last 3 results) No results for input(s): BNP in the last 8760 hours.  ProBNP (last 3 results) No results for input(s): PROBNP in the last 8760 hours.   D-Dimer No results for input(s): DDIMER in the last 72 hours. Hemoglobin A1C No results for input(s): HGBA1C in the last 72 hours. Fasting Lipid Panel No results for input(s): CHOL, HDL, LDLCALC, TRIG,  CHOLHDL, LDLDIRECT in the last 72 hours. Thyroid Function Tests No results for input(s): TSH, T4TOTAL, T3FREE, THYROIDAB in the last 72 hours.  Invalid input(s): FREET3  Other results:   Imaging    No results found.   Medications:     Scheduled Medications:  aspirin EC  325 mg Oral Daily   Or   aspirin  324 mg Per Tube Daily   bisacodyl  10 mg Rectal Q0600   Chlorhexidine Gluconate Cloth  6 each Topical Daily   Chlorhexidine Gluconate Cloth  6 each Topical Q0600   docusate sodium  200 mg Oral Daily   guaiFENesin  1,200 mg Oral BID   insulin aspart  0-24 Units Subcutaneous Q4H   insulin detemir  16 Units Subcutaneous Q12H   lactulose  10 g Oral Daily   lidocaine  2 patch Transdermal Q24H   metoCLOPramide (REGLAN) injection   5 mg Intravenous Q8H   midodrine  15 mg Oral TID WC   midodrine  15 mg Oral Once   multivitamin  1 tablet Oral QHS   pantoprazole  40 mg Oral Daily   rosuvastatin  20 mg Oral Daily   sodium chloride flush  3 mL Intravenous Q12H   vancomycin variable dose per unstable renal function (pharmacist dosing)   Does not apply See admin instructions    Infusions:  sodium chloride Stopped (11/14/21 0549)   sodium chloride     sodium chloride     amiodarone 30 mg/hr (11/24/21 0431)   anticoagulant sodium citrate     ceFEPime (MAXIPIME) IV Stopped (11/23/21 1122)   heparin 1,700 Units/hr (11/24/21 0431)   lactated ringers Stopped (11/18/21 2130)   lactated ringers Stopped (11/17/21 2054)   micafungin (MYCAMINE) IV Stopped (11/23/21 0940)    PRN Medications: sodium chloride, alteplase, anticoagulant sodium citrate, heparin, heparin, HYDROmorphone (DILAUDID) injection, ipratropium-albuterol, lidocaine (PF), lidocaine-prilocaine, ondansetron (ZOFRAN) IV, oxyCODONE, pentafluoroprop-tetrafluoroeth, sodium chloride, sodium chloride flush    Patient Profile   66 y.o. male with history of CM with recovered EF, persistent atrial fibrillation, DM II, CKD, sick sinus syndrome, OSA, prior smoker. Underwent CABG X 3 and MAZE/PVI on 05/23>>post-op decrease in UOP and AKI with volume overload>>postcardiotomy shock requiring inotrope and pressor support  Assessment/Plan   1. Shock: Echo reviewed, LV EF 55-60%, RV poorly visualized but probably near-normal, no significant pericardial effusion. Suspect sepsis physiology with distributive shock.  Improved with broad spectrum abx. Lactate has cleared. Bcx 5/25 remain NGTD. Now off pressors/resolved.   - Continue midodrine to 15 mg TID 2. Acute on chronic primarily diastolic CHF: LV EF 90-24%, RV poorly visualized but probably near-normal, no significant pericardial effusion.  Patient is still volume overloaded, suspect primarily right heart failure physiology.   CVVHD started on 5/26. Now getting iHD, last session yesterday.  CVP 7 today.   - Discussed with nephrology, will get HD tomorrow. 3. AKI on CKD stage 3: Baseline SCr 1.3-1.5 .  Suspect due to shock/ATN as well as elevated renal venous pressure in setting of volume overload, ileus, abdominal distention. CVVHD started 5/26. Remains nearly anuric.  Lost HD line 5/31, replaced 06/01. Now getting iHD.  4. CAD: S/p CABG. No chest pain.  - Continue ASA, statin.  5. Atrial fibrillation with RVR: Has had Maze and LA appendage clip at time of CABG on 11/12/21. S/p TEE/DCCV to SR on 06//01. Back in AF by 6/2.  In AF with HR 90s today. - Continue heparin gtt - Continue amio gtt  30/hr - Plan  for DCCV tomorrow. Discussed risks/benefits with patient and he agreed to procedure.   6. DM2: SSI.  7. ID: WBCs 22>>16K > 14K -> 12.8 -> 16 -> 17 -> 19.8 -> 22 -> 25 -> 23K.  Afebrile.  Septic shock initially, resolved.  Blood cultures NGTD.  Treated for HCAP, completed vanc and cefepime.  With WBCs rising again and concern for aspiration, he is back on vanc/cefepime and micafungin given yeast in sputum.  - Continue vanc/cefepime/micafungin per pharmacy.  8. Ileus: Improved.  9. Pulmonary: Respiratory status improving with fluid removal and abx. Thoracentesis on 6/2.  - HD for volume removal  - CCM following.    Continue to mobilize. PT/OT.  Length of Stay: 32  Loralie Champagne, MD  11/24/2021, 7:49 AM  Advanced Heart Failure Team Pager [phone] (M-F; 7a - 5p)  Please contact [facility] for night-coverage after hours (5p -7a ) and weekends on [email or web address]

## 2021-11-24 NOTE — Progress Notes (Signed)
Pharmacy Antibiotic Note  [name] [name] is a 66 y.o. male s/p CABG with HF s/p CRRT > iHD s/p sessions 6/1 and 6/3.  He is on rocephin for PNA and WBC trending up. Pharmacy has been consulted for cefepime and vancomycin dosing (micafungin has also been added) -WBC= 23.1 -respiratory cultures with GPR and yeast -Last vancomycin dose was 1000mg  on 6/3 -plans noted for HD MWF  Plan: -Cefepime 1gm daily -Vancomycin 1000mg  IV with HD MWF -Will follow  cultures and clinical progress   Height: 5\' 10"  (177.8 cm) Weight: 121.1 kg (266 lb 15.6 oz) IBW/kg (Calculated) : 73  Temp (24hrs), Avg:97.8 F (36.6 C), Min:97.4 F (36.3 C), Max:98.5 F (36.9 C)  Recent Labs  Lab 11/19/21 0429 11/19/21 1508 11/20/21 0409 11/20/21 1519 11/21/21 0501 11/21/21 1848 11/22/21 0455 11/22/21 1628 11/23/21 0434 11/23/21 1613 11/24/21 0455  WBC 16.0*  --  17.1*  --  19.8*  --  22.2*  --  24.7*  --  23.1*  CREATININE 2.27*   < > 2.32*   < > 4.74*   < > 4.00* 4.86* 5.71* 6.36* 3.38*  LATICACIDVEN 1.3  --   --   --   --   --   --   --   --   --   --    < > = values in this interval not displayed.     Estimated Creatinine Clearance: 27.7 mL/min (A) (by C-G formula based on SCr of 3.38 mg/dL (H)).    No Known Allergies  Antimicrobials this admission: 5/23-5/25 periop cefazolin  Cefepime 5/25>> 5/31 Vancomycin 5/23>> 5/29 Ceftriaxone 6/1 > 6/2 Cefepime 6/2> Vancomycin 6/2> Micafungin 6/2>   Hildred Laser, PharmD Clinical Pharmacist **Pharmacist phone directory can now be found on [email or web address] ([email or web address]).  Listed under [place].  e

## 2021-11-24 NOTE — Progress Notes (Signed)
Received patient in bed, alert and oriented. Informed consent signed and in chart.  Time tx initiated:01:38  Pre HD weight:121.4kg  Pre HD VS:97.4, 117/61, 19  Time tx completed:0510  HD treatment completed. Patient tolerated well. HD catheter without signs and symptoms of complications. Patient is in CU room, alert, oriented and in no acute distress. Report given to Ailene Rud, RN.  Total UF removed:3000  Medication given:none  Post HD VS:117/61, 76,  19  Post HD weight:

## 2021-11-24 NOTE — H&P (View-Only) (Signed)
Patient ID: [name] [name], male   DOB: May 17, 1956, 66 y.o.   MRN: [id number]     Advanced Heart Failure Rounding Note  PCP-Cardiologist: None   Subjective:    iHD yesterday, CVP 7 today.    Abx started 5/25 for suspected septic shock.  Initial abx completed and afebrile, but WBCs up, 17>>19.8>>22.2>>25>>23. CCM added rocephin 06/01 for possible aspiration. On 6/2, abx transitioned to vanc/cefepime and micafungin added (yeast in resp culture).   S/p TEE/DCCV to SR 06/01. Back in AF with rates 90s this am.  Main complaint today is back pain radiating to left leg. He was able to some walking yesterday.   Left thoracentesis 6/2 with 900 cc off.   Echo: Difficult study. LV EF 55-60%, RV probably normal, no pericardial effusion.    Objective:   Weight Range: 121.1 kg Body mass index is 38.31 kg/m.   Vital Signs:   Temp:  [97.4 F (36.3 C)-98.7 F (37.1 C)] 97.5 F (36.4 C) (06/04 0415) Pulse Rate:  [71-92] 90 (06/04 0600) Resp:  [12-27] 13 (06/04 0600) BP: (89-123)/(48-88) 99/62 (06/04 0600) SpO2:  [91 %-99 %] 91 % (06/04 0600) Last BM Date : 11/23/21  Weight change: Filed Weights   11/21/21 0500 11/22/21 0500 11/23/21 0500  Weight: 116.8 kg 122 kg 121.1 kg    Intake/Output:   Intake/Output Summary (Last 24 hours) at 11/24/2021 0749 Last data filed at 11/24/2021 0530 Gross per 24 hour  Intake 1843.72 ml  Output 3000 ml  Net -1156.28 ml      Physical Exam   General: NAD Neck: No JVD, no thyromegaly or thyroid nodule.  Lungs: Clear to auscultation bilaterally with normal respiratory effort. CV: Nondisplaced PMI.  Heart regular S1/S2, no S3/S4, no murmur.  No peripheral edema.   Abdomen: Soft, nontender, no hepatosplenomegaly, mild distention.  Skin: Intact without lesions or rashes.  Neurologic: Alert and oriented x 3.  Psych: Normal affect. Extremities: No clubbing or cyanosis.  HEENT: Normal.   Telemetry   Afib 90s (personally reviewed)   Labs     CBC Recent Labs    11/23/21 0434 11/24/21 0455  WBC 24.7* 23.1*  HGB 7.9* 8.0*  HCT 23.9* 24.3*  MCV 94.1 92.0  PLT 248 628   Basic Metabolic Panel Recent Labs    11/23/21 0434 11/23/21 1613 11/24/21 0455  NA 131* 128* 134*  K 4.2 4.6 3.8  CL 96* 93* 94*  CO2 20* 18* 27  GLUCOSE 101* 267* 112*  BUN 60* 69* 33*  CREATININE 5.71* 6.36* 3.38*  CALCIUM 8.2* 8.2* 8.6*  MG 2.3  --  1.8  PHOS 7.6* 7.7* 3.3   Liver Function Tests Recent Labs    11/22/21 2239 11/23/21 0434 11/23/21 1613 11/24/21 0455  PROT 6.6  --   --   --   ALBUMIN  --    < > 3.0* 3.4*   < > = values in this interval not displayed.   No results for input(s): LIPASE, AMYLASE in the last 72 hours. Cardiac Enzymes No results for input(s): CKTOTAL, CKMB, CKMBINDEX, TROPONINI in the last 72 hours.  BNP: BNP (last 3 results) No results for input(s): BNP in the last 8760 hours.  ProBNP (last 3 results) No results for input(s): PROBNP in the last 8760 hours.   D-Dimer No results for input(s): DDIMER in the last 72 hours. Hemoglobin A1C No results for input(s): HGBA1C in the last 72 hours. Fasting Lipid Panel No results for input(s): CHOL, HDL, LDLCALC, TRIG,  CHOLHDL, LDLDIRECT in the last 72 hours. Thyroid Function Tests No results for input(s): TSH, T4TOTAL, T3FREE, THYROIDAB in the last 72 hours.  Invalid input(s): FREET3  Other results:   Imaging    No results found.   Medications:     Scheduled Medications:  aspirin EC  325 mg Oral Daily   Or   aspirin  324 mg Per Tube Daily   bisacodyl  10 mg Rectal Q0600   Chlorhexidine Gluconate Cloth  6 each Topical Daily   Chlorhexidine Gluconate Cloth  6 each Topical Q0600   docusate sodium  200 mg Oral Daily   guaiFENesin  1,200 mg Oral BID   insulin aspart  0-24 Units Subcutaneous Q4H   insulin detemir  16 Units Subcutaneous Q12H   lactulose  10 g Oral Daily   lidocaine  2 patch Transdermal Q24H   metoCLOPramide (REGLAN) injection   5 mg Intravenous Q8H   midodrine  15 mg Oral TID WC   midodrine  15 mg Oral Once   multivitamin  1 tablet Oral QHS   pantoprazole  40 mg Oral Daily   rosuvastatin  20 mg Oral Daily   sodium chloride flush  3 mL Intravenous Q12H   vancomycin variable dose per unstable renal function (pharmacist dosing)   Does not apply See admin instructions    Infusions:  sodium chloride Stopped (11/14/21 0549)   sodium chloride     sodium chloride     amiodarone 30 mg/hr (11/24/21 0431)   anticoagulant sodium citrate     ceFEPime (MAXIPIME) IV Stopped (11/23/21 1122)   heparin 1,700 Units/hr (11/24/21 0431)   lactated ringers Stopped (11/18/21 2130)   lactated ringers Stopped (11/17/21 2054)   micafungin (MYCAMINE) IV Stopped (11/23/21 0940)    PRN Medications: sodium chloride, alteplase, anticoagulant sodium citrate, heparin, heparin, HYDROmorphone (DILAUDID) injection, ipratropium-albuterol, lidocaine (PF), lidocaine-prilocaine, ondansetron (ZOFRAN) IV, oxyCODONE, pentafluoroprop-tetrafluoroeth, sodium chloride, sodium chloride flush    Patient Profile   66 y.o. male with history of CM with recovered EF, persistent atrial fibrillation, DM II, CKD, sick sinus syndrome, OSA, prior smoker. Underwent CABG X 3 and MAZE/PVI on 05/23>>post-op decrease in UOP and AKI with volume overload>>postcardiotomy shock requiring inotrope and pressor support  Assessment/Plan   1. Shock: Echo reviewed, LV EF 55-60%, RV poorly visualized but probably near-normal, no significant pericardial effusion. Suspect sepsis physiology with distributive shock.  Improved with broad spectrum abx. Lactate has cleared. Bcx 5/25 remain NGTD. Now off pressors/resolved.   - Continue midodrine to 15 mg TID 2. Acute on chronic primarily diastolic CHF: LV EF 06-23%, RV poorly visualized but probably near-normal, no significant pericardial effusion.  Patient is still volume overloaded, suspect primarily right heart failure physiology.   CVVHD started on 5/26. Now getting iHD, last session yesterday.  CVP 7 today.   - Discussed with nephrology, will get HD tomorrow. 3. AKI on CKD stage 3: Baseline SCr 1.3-1.5 .  Suspect due to shock/ATN as well as elevated renal venous pressure in setting of volume overload, ileus, abdominal distention. CVVHD started 5/26. Remains nearly anuric.  Lost HD line 5/31, replaced 06/01. Now getting iHD.  4. CAD: S/p CABG. No chest pain.  - Continue ASA, statin.  5. Atrial fibrillation with RVR: Has had Maze and LA appendage clip at time of CABG on 11/12/21. S/p TEE/DCCV to SR on 06//01. Back in AF by 6/2.  In AF with HR 90s today. - Continue heparin gtt - Continue amio gtt  30/hr - Plan  for DCCV tomorrow. Discussed risks/benefits with patient and he agreed to procedure.   6. DM2: SSI.  7. ID: WBCs 22>>16K > 14K -> 12.8 -> 16 -> 17 -> 19.8 -> 22 -> 25 -> 23K.  Afebrile.  Septic shock initially, resolved.  Blood cultures NGTD.  Treated for HCAP, completed vanc and cefepime.  With WBCs rising again and concern for aspiration, he is back on vanc/cefepime and micafungin given yeast in sputum.  - Continue vanc/cefepime/micafungin per pharmacy.  8. Ileus: Improved.  9. Pulmonary: Respiratory status improving with fluid removal and abx. Thoracentesis on 6/2.  - HD for volume removal  - CCM following.    Continue to mobilize. PT/OT.  Length of Stay: 28  Loralie Champagne, MD  11/24/2021, 7:49 AM  Advanced Heart Failure Team Pager [phone] (M-F; 7a - 5p)  Please contact [facility] for night-coverage after hours (5p -7a ) and weekends on [email or web address]

## 2021-11-24 NOTE — Progress Notes (Signed)
Pt refusing to ambulate this morning. His sleep was limited due to Dialysis treatment and he wants to walk after breakfast.

## 2021-11-24 NOTE — Progress Notes (Signed)
3 Days Post-Op Procedure(s) (LRB): TRANSESOPHAGEAL ECHOCARDIOGRAM (TEE) (N/A) CARDIOVERSION (N/A) BUBBLE STUDY Subjective:  Feels ok this am. Did not sleep much because they were doing HD from 1 am until 5 am.  Just ambulated a full lap around the ICU and ambulated twice yesterday. Says his leg pain is better.  Objective: Vital signs in last 24 hours: Temp:  [97.4 F (36.3 C)-98.7 F (37.1 C)] 98.7 F (37.1 C) (06/04 0700) Pulse Rate:  [71-91] 90 (06/04 0600) Cardiac Rhythm: Normal sinus rhythm (06/03 2000) Resp:  [12-27] 13 (06/04 0600) BP: (89-123)/(48-80) 99/62 (06/04 0600) SpO2:  [91 %-99 %] 91 % (06/04 0600)  Hemodynamic parameters for last 24 hours: CVP:  [8 mmHg-22 mmHg] 10 mmHg  Intake/Output from previous day: 06/03 0701 - 06/04 0700 In: 1843.7 [P.O.:360; I.V.:1182.6; IV Piggyback:301.2] Out: 3000  Intake/Output this shift: No intake/output data recorded.  General appearance: alert and cooperative Neurologic: intact Heart: regular rate and rhythm, S1, S2 normal, no murmur Lungs: diminished breath sounds bibasilar Abdomen: soft, non-tender; bowel sounds normal Extremities: edema mild Wound: incision healing well.  Lab Results: Recent Labs    11/23/21 0434 11/24/21 0455  WBC 24.7* 23.1*  HGB 7.9* 8.0*  HCT 23.9* 24.3*  PLT 248 293   BMET:  Recent Labs    11/23/21 1613 11/24/21 0455  NA 128* 134*  K 4.6 3.8  CL 93* 94*  CO2 18* 27  GLUCOSE 267* 112*  BUN 69* 33*  CREATININE 6.36* 3.38*  CALCIUM 8.2* 8.6*    PT/INR: No results for input(s): LABPROT, INR in the last 72 hours. ABG    Component Value Date/Time   PHART 7.332 (L) 11/15/2021 0410   HCO3 24.2 11/16/2021 0414   TCO2 26 11/16/2021 0414   ACIDBASEDEF 3.0 (H) 11/16/2021 0414   O2SAT 56.1 11/21/2021 0612   CBG (last 3)  Recent Labs    11/23/21 2005 11/24/21 0004 11/24/21 0415  GLUCAP 196* 168* 121*    Assessment/Plan:  He remains hemodynamically stable in rate controlled  AF on IV amio. On midodrine for BP support. CVP 10 this am after 3L HD.  WBC stable from yesterday at 24K on vanc, Maxipime and micafungin. No new culture results. UC pending.  He is walking better.   LOS: 12 days    Gaye Pollack 11/24/2021

## 2021-11-24 NOTE — Plan of Care (Signed)

## 2021-11-25 ENCOUNTER — Inpatient Hospital Stay (HOSPITAL_COMMUNITY): Payer: Medicare HMO

## 2021-11-25 ENCOUNTER — Encounter (HOSPITAL_COMMUNITY)
Admission: RE | Disposition: A | Discharge status: Home / Self Care | Payer: Self-pay | Source: Home / Self Care | Attending: Cardiothoracic Surgery

## 2021-11-25 ENCOUNTER — Inpatient Hospital Stay (HOSPITAL_COMMUNITY): Payer: Medicare HMO | Admitting: Anesthesiology

## 2021-11-25 DIAGNOSIS — I4891 Unspecified atrial fibrillation: Secondary | ICD-10-CM

## 2021-11-25 DIAGNOSIS — I2510 Atherosclerotic heart disease of native coronary artery without angina pectoris: Secondary | ICD-10-CM

## 2021-11-25 DIAGNOSIS — Z951 Presence of aortocoronary bypass graft: Secondary | ICD-10-CM | POA: Diagnosis not present

## 2021-11-25 DIAGNOSIS — I110 Hypertensive heart disease with heart failure: Secondary | ICD-10-CM

## 2021-11-25 DIAGNOSIS — I509 Heart failure, unspecified: Secondary | ICD-10-CM | POA: Diagnosis not present

## 2021-11-25 DIAGNOSIS — N179 Acute kidney failure, unspecified: Secondary | ICD-10-CM | POA: Diagnosis not present

## 2021-11-25 HISTORY — PX: CARDIOVERSION: SHX1299

## 2021-11-25 LAB — RENAL FUNCTION PANEL
Albumin: 2.9 g/dL — ABNORMAL LOW (ref 3.5–5.0)
Albumin: 2.8 g/dL — ABNORMAL LOW (ref 3.5–5.0)
Anion gap: 15 (ref 5–15)
Anion gap: 17 — ABNORMAL HIGH (ref 5–15)
BUN: 52 mg/dL — ABNORMAL HIGH (ref 8–23)
BUN: 31 mg/dL — ABNORMAL HIGH (ref 8–23)
CO2: 21 mmol/L — ABNORMAL LOW (ref 22–32)
CO2: 22 mmol/L (ref 22–32)
Calcium: 8.5 mg/dL — ABNORMAL LOW (ref 8.9–10.3)
Calcium: 7.9 mg/dL — ABNORMAL LOW (ref 8.9–10.3)
Chloride: 94 mmol/L — ABNORMAL LOW (ref 98–111)
Chloride: 88 mmol/L — ABNORMAL LOW (ref 98–111)
Creatinine, Ser: 6.09 mg/dL — ABNORMAL HIGH (ref 0.61–1.24)
Creatinine, Ser: 4.01 mg/dL — ABNORMAL HIGH (ref 0.61–1.24)
GFR, Estimated: 9 mL/min — ABNORMAL LOW (ref >60)
GFR, Estimated: 16 mL/min — ABNORMAL LOW (ref >60)
Glucose, Bld: 187 mg/dL — ABNORMAL HIGH (ref 70–99)
Glucose, Bld: 201 mg/dL — ABNORMAL HIGH (ref 70–99)
Phosphorus: 6 mg/dL — ABNORMAL HIGH (ref 2.5–4.6)
Phosphorus: 3.9 mg/dL (ref 2.5–4.6)
Potassium: 4.6 mmol/L (ref 3.5–5.1)
Potassium: 3.6 mmol/L (ref 3.5–5.1)
Sodium: 130 mmol/L — ABNORMAL LOW (ref 135–145)
Sodium: 127 mmol/L — ABNORMAL LOW (ref 135–145)

## 2021-11-25 LAB — CBC
HCT: 23.1 % — ABNORMAL LOW (ref 39.0–52.0)
Hemoglobin: 7.4 g/dL — ABNORMAL LOW (ref 13.0–17.0)
MCH: 29.8 pg (ref 26.0–34.0)
MCHC: 32 g/dL (ref 30.0–36.0)
MCV: 93.1 fL (ref 80.0–100.0)
Platelets: 310 10*3/uL (ref 150–400)
RBC: 2.48 MIL/uL — ABNORMAL LOW (ref 4.22–5.81)
RDW: 15.6 % — ABNORMAL HIGH (ref 11.5–15.5)
WBC: 31.5 10*3/uL — ABNORMAL HIGH (ref 4.0–10.5)
nRBC: 0 % (ref 0.0–0.2)

## 2021-11-25 LAB — HEPARIN LEVEL (UNFRACTIONATED): Heparin Unfractionated: 0.31 IU/mL (ref 0.30–0.70)

## 2021-11-25 LAB — GLUCOSE, CAPILLARY
Glucose-Capillary: 181 mg/dL — ABNORMAL HIGH (ref 70–99)
Glucose-Capillary: 298 mg/dL — ABNORMAL HIGH (ref 70–99)
Glucose-Capillary: 222 mg/dL — ABNORMAL HIGH (ref 70–99)
Glucose-Capillary: 191 mg/dL — ABNORMAL HIGH (ref 70–99)

## 2021-11-25 LAB — BODY FLUID CULTURE W GRAM STAIN: Culture: NO GROWTH

## 2021-11-25 LAB — PATHOLOGIST SMEAR REVIEW

## 2021-11-25 LAB — PROTIME-INR
INR: 1.4 — ABNORMAL HIGH (ref 0.8–1.2)
Prothrombin Time: 16.7 seconds — ABNORMAL HIGH (ref 11.4–15.2)

## 2021-11-25 LAB — PREPARE RBC (CROSSMATCH)

## 2021-11-25 LAB — MAGNESIUM: Magnesium: 2 mg/dL (ref 1.7–2.4)

## 2021-11-25 SURGERY — CARDIOVERSION
Anesthesia: Monitor Anesthesia Care

## 2021-11-25 MED ORDER — VANCOMYCIN HCL IN DEXTROSE 1-5 GM/200ML-% IV SOLN
INTRAVENOUS | Status: AC
  Filled 2021-11-25: qty 200

## 2021-11-25 MED ORDER — SODIUM CHLORIDE 0.9 % IV SOLN
INTRAVENOUS | Status: DC
  Administered 2021-11-25: 500 mL via INTRAVENOUS

## 2021-11-25 MED ORDER — PHENYLEPHRINE 80 MCG/ML (10ML) SYRINGE FOR IV PUSH (FOR BLOOD PRESSURE SUPPORT)
PREFILLED_SYRINGE | INTRAVENOUS | Status: AC
  Filled 2021-11-25: qty 10

## 2021-11-25 MED ORDER — PROPOFOL 10 MG/ML IV BOLUS
INTRAVENOUS | Status: DC | PRN
  Administered 2021-11-25: 60 mg via INTRAVENOUS

## 2021-11-25 MED ORDER — SODIUM CHLORIDE 0.9 % IV SOLN
150.0000 mg | INTRAVENOUS | Status: DC
  Administered 2021-11-25: 150 mg via INTRAVENOUS
  Filled 2021-11-25 (×2): qty 15

## 2021-11-25 MED ORDER — LIDOCAINE 2% (20 MG/ML) 5 ML SYRINGE
INTRAMUSCULAR | Status: DC | PRN
  Administered 2021-11-25: 40 mg via INTRAVENOUS

## 2021-11-25 MED ORDER — DEXMEDETOMIDINE HCL IN NACL 80 MCG/20ML IV SOLN
INTRAVENOUS | Status: AC
  Filled 2021-11-25: qty 20

## 2021-11-25 MED ORDER — DARBEPOETIN ALFA 100 MCG/0.5ML IJ SOSY
PREFILLED_SYRINGE | INTRAMUSCULAR | Status: AC
  Filled 2021-11-25: qty 0.5

## 2021-11-25 MED ORDER — LIDOCAINE 5 % EX PTCH
1.0000 | MEDICATED_PATCH | CUTANEOUS | Status: DC
  Administered 2021-11-25 – 2021-12-03 (×8): 1 via TRANSDERMAL
  Filled 2021-11-25 (×9): qty 1

## 2021-11-25 MED ORDER — LIDOCAINE 2% (20 MG/ML) 5 ML SYRINGE
INTRAMUSCULAR | Status: AC
  Filled 2021-11-25: qty 5

## 2021-11-25 NOTE — Anesthesia Preprocedure Evaluation (Signed)
Anesthesia Evaluation  Patient identified by MRN, date of birth, ID band Patient awake    Reviewed: Allergy & Precautions, NPO status , Patient's Chart, lab work & pertinent test results  Airway Mallampati: II  TM Distance: >3 FB     Dental   Pulmonary former smoker,    breath sounds clear to auscultation       Cardiovascular hypertension, + CAD and +CHF  + dysrhythmias Atrial Fibrillation  Rhythm:Irregular Rate:Normal     Neuro/Psych    GI/Hepatic negative GI ROS, Neg liver ROS,   Endo/Other  diabetes  Renal/GU Renal disease     Musculoskeletal   Abdominal   Peds  Hematology   Anesthesia Other Findings   Reproductive/Obstetrics                             Anesthesia Physical Anesthesia Plan  ASA: 3  Anesthesia Plan: MAC   Post-op Pain Management:    Induction: Intravenous  PONV Risk Score and Plan: Treatment may vary due to age or medical condition  Airway Management Planned: Simple Face Mask and Nasal Cannula  Additional Equipment:   Intra-op Plan:   Post-operative Plan:   Informed Consent: I have reviewed the patients History and Physical, chart, labs and discussed the procedure including the risks, benefits and alternatives for the proposed anesthesia with the patient or authorized representative who has indicated his/her understanding and acceptance.     Dental advisory given  Plan Discussed with: CRNA and Anesthesiologist  Anesthesia Plan Comments:         Anesthesia Quick Evaluation

## 2021-11-25 NOTE — Progress Notes (Signed)
CT surgery PM rounds  Patient completing bedside hemodialysis, removal 3 L Sinus rhythm after DC cardioversion earlier today Afebrile Complains of left lower back sciatica pain CT scans of chest abdomen and pelvis pending for increasing white count despite adequate broad empiric antibiotic coverage  Blood pressure (!) 118/50, pulse 85, temperature 98.6 F (37 C), temperature source Oral, resp. rate (!) 23, height 5\' 10"  (1.778 m), weight 124 kg, SpO2 92 %.

## 2021-11-25 NOTE — Progress Notes (Signed)
Inpatient Diabetes Program Recommendations  AACE/ADA: New Consensus Statement on Inpatient Glycemic Control (2015)  Target Ranges:  Prepandial:   less than 140 mg/dL      Peak postprandial:   less than 180 mg/dL (1-2 hours)      Critically ill patients:  140 - 180 mg/dL   Lab Results  Component Value Date   GLUCAP 298 (H) 11/25/2021   HGBA1C 9.3 (H) 11/08/2021    Review of Glycemic Control  Diabetes history: Type 2 DM Outpatient Diabetes medications: Metformin 1000 mg BID, Farxiga 10 mg QD, Trulicity 3 mg Qwk, Glipizide 10 mg BID Current orders for Inpatient glycemic control: Levemir 16 units BID, Novolog 0-24 units TID   Inpatient Diabetes Program Recommendations:     Consider Novolog 4 units TID (assuming patient consuming >50% of meals).   Thanks, Bronson Curb, MSN, RNC-OB Diabetes Coordinator [phone] (8a-5p)

## 2021-11-25 NOTE — Progress Notes (Signed)
Patient ID: [name] [name], male   DOB: [birth year], 66 y.o.   MRN: [id number] S: complaining of back and hip pain. O:BP 115/73   Pulse 79   Temp 98.4 F (36.9 C) (Oral)   Resp (!) 21   Ht 5\' 10"  (1.778 m)   Wt 122 kg   SpO2 96%   BMI 38.59 kg/m   Intake/Output Summary (Last 24 hours) at 11/25/2021 1358 Last data filed at 11/25/2021 1200 Gross per 24 hour  Intake 1039.96 ml  Output 140 ml  Net 899.96 ml   Intake/Output: I/O last 3 completed shifts: In: 2041 [P.O.:570; I.V.:1203.9; IV Piggyback:267.1] Out: 3140 [Urine:140; Other:3000]  Intake/Output this shift:  Total I/O In: 146.7 [I.V.:11.1; IV Piggyback:135.6] Out: -  Weight change:  Gen:NAD CVS: RRR Resp: crackles at LLL field Abd: obese, +BS, soft, NT/ND Ext: trace pretibial edema bilaterally  Recent Labs  Lab 11/22/21 0455 11/22/21 1628 11/23/21 0434 11/23/21 1613 11/24/21 0455 11/24/21 1543 11/25/21 0358  NA 133* 132* 131* 128* 134* 133* 130*  K 3.7 4.3 4.2 4.6 3.8 4.3 4.6  CL 99 99 96* 93* 94* 94* 94*  CO2 24 19* 20* 18* 27 24 21*  GLUCOSE 72 278* 101* 267* 112* 126* 187*  BUN 40* 50* 60* 69* 33* 46* 52*  CREATININE 4.00* 4.86* 5.71* 6.36* 3.38* 4.99* 6.09*  ALBUMIN 2.9* 2.6* 3.0* 3.0* 3.4* 3.1* 2.9*  CALCIUM 8.3* 7.5* 8.2* 8.2* 8.6* 8.7* 8.5*  PHOS 5.2* 6.1* 7.6* 7.7* 3.3 5.2* 6.0*   Liver Function Tests: Recent Labs  Lab 11/22/21 2239 11/23/21 0434 11/24/21 0455 11/24/21 1543 11/25/21 0358  PROT 6.6  --   --   --   --   ALBUMIN  --    < > 3.4* 3.1* 2.9*   < > = values in this interval not displayed.   No results for input(s): LIPASE, AMYLASE in the last 168 hours. No results for input(s): AMMONIA in the last 168 hours. CBC: Recent Labs  Lab 11/21/21 0501 11/22/21 0455 11/23/21 0434 11/24/21 0455 11/25/21 0358  WBC 19.8* 22.2* 24.7* 23.1* 31.5*  HGB 8.7* 8.4* 7.9* 8.0* 7.4*  HCT 27.9* 26.7* 23.9* 24.3* 23.1*  MCV 95.5 94.7 94.1 92.0 93.1  PLT 269 253 248 293 310   Cardiac Enzymes: No  results for input(s): CKTOTAL, CKMB, CKMBINDEX, TROPONINI in the last 168 hours. CBG: Recent Labs  Lab 11/24/21 1142 11/24/21 1635 11/24/21 1940 11/25/21 0353 11/25/21 1107  GLUCAP 313* 107* 191* 181* 298*    Iron Studies: No results for input(s): IRON, TIBC, TRANSFERRIN, FERRITIN in the last 72 hours. Studies/Results: No results found.  aspirin EC  325 mg Oral Daily   Or   aspirin  324 mg Per Tube Daily   bisacodyl  10 mg Rectal Q0600   Chlorhexidine Gluconate Cloth  6 each Topical Daily   darbepoetin (ARANESP) injection - DIALYSIS  100 mcg Intravenous Q Mon-HD   docusate sodium  200 mg Oral Daily   guaiFENesin  1,200 mg Oral BID   insulin aspart  0-24 Units Subcutaneous TID WC   insulin detemir  16 Units Subcutaneous Q12H   lactulose  10 g Oral Daily   lidocaine  2 patch Transdermal Q24H   metoCLOPramide (REGLAN) injection  5 mg Intravenous Q8H   midodrine  15 mg Oral TID WC   midodrine  15 mg Oral Once   multivitamin  1 tablet Oral QHS   pantoprazole  40 mg Oral Daily   rosuvastatin  20  mg Oral Daily   sodium chloride flush  3 mL Intravenous Q12H    BMET    Component Value Date/Time   NA 130 (L) 11/25/2021 0358   NA 139 10/21/2021 1547   K 4.6 11/25/2021 0358   CL 94 (L) 11/25/2021 0358   CO2 21 (L) 11/25/2021 0358   GLUCOSE 187 (H) 11/25/2021 0358   BUN 52 (H) 11/25/2021 0358   BUN 29 (H) 10/21/2021 1547   CREATININE 6.09 (H) 11/25/2021 0358   CALCIUM 8.5 (L) 11/25/2021 0358   GFRNONAA 9 (L) 11/25/2021 0358   GFRAA 42 (L) 10/18/2019 1006   CBC    Component Value Date/Time   WBC 31.5 (H) 11/25/2021 0358   RBC 2.48 (L) 11/25/2021 0358   HGB 7.4 (L) 11/25/2021 0358   HGB 13.4 10/21/2021 1547   HCT 23.1 (L) 11/25/2021 0358   HCT 40.8 10/21/2021 1547   PLT 310 11/25/2021 0358   PLT 246 10/21/2021 1547   MCV 93.1 11/25/2021 0358   MCV 91 10/21/2021 1547   MCH 29.8 11/25/2021 0358   MCHC 32.0 11/25/2021 0358   RDW 15.6 (H) 11/25/2021 0358   RDW 14.6  10/21/2021 1547     Assessment/Plan: Acute kidney injury on chronic kidney disease stage IIIa: -AKI likely ischemic ATN in the setting of septic versus cardiogenic shock.  Started on CRRT 5/26 for extracorporeal volume unloading after he had fixed urine output on Lasix drip that was inadequate for volume unloading. CRRT 5/26-5/31.  New temp HD catheter placed 11/21/21 and has had IHD on 6/3 and is due for another session today on MWF schedule. unable to receive [facility] given leukocytosis/infection.  no evidence of renal recovery at this junction, will need his HD catheter converted to [facility] down the road  Coronary artery disease status post three-vessel CABG: -on aspirin/statin. Acute on chronic dCHF -UF as tolerated with HD, volume status improving -milrinone stopped 5/29 Shock -possibly from sepsis+cardiogenic. Abx/pressor support per primary service. Sepsis w/u per CCM/  -off pressors and on midodrine now Atrial fibrillation: -Status post MAZE procedure at the time of CABG. Per primary service, on amio. S/p tee-dccv 6/1.  S/p a successful cardioversion today.  Anemia: -Likely associated with recent surgery and postoperative inflammatory response.  transfuse PRN for hgb <7-iron panel=iron deficient but will hold off on adding Fe given that he is receiving abx and there is no clearcut source of infection. ESA to start 6/5 Hyponatremia -Managing with HD and UF, improved Left Pleural Effusion -s/p thora 6/2, 900cc removed  Donetta Potts, MD [facility]

## 2021-11-25 NOTE — Progress Notes (Signed)
PT Cancellation Note  Patient Details Name: [name] [name] MRN: [id number] DOB: [birth year]   Cancelled Treatment:    Reason Eval/Treat Not Completed: Patient declined, no reason specified Pt adamantly declined any mobility today despite returning after being pre medicated, "you can talk to that door right there, I am not walking today." Attempted explaining importance of mobility however pt continued to refuse reporting hip pain and "I need to talk to a doctor before doing anything." Will follow.   Marguarite Arbour A Renette Hsu 11/25/2021, 12:35 PM Marisa Severin, PT, DPT Acute Rehabilitation Services Secure chat preferred Office [phone]

## 2021-11-25 NOTE — Progress Notes (Signed)
OT Cancellation Note  Patient Details Name: [name] [name] MRN: [id number] DOB: 07/07/1955   Cancelled Treatment:    Reason Eval/Treat Not Completed: Patient at procedure or test/ unavailable.  Pt off floor getting cardioversion.  [name], OTR/L Acute Rehab Services [facility] [phone] Office [phone]    [name] 11/25/2021, 7:48 AM

## 2021-11-25 NOTE — Transfer of Care (Signed)
Immediate Anesthesia Transfer of Care Note  Patient: [name] [name]  Procedure(s) Performed: CARDIOVERSION  Patient Location: Endoscopy Unit  Anesthesia Type:General  Level of Consciousness: awake, alert  and oriented  Airway & Oxygen Therapy: Patient Spontanous Breathing and Patient connected to nasal cannula oxygen  Post-op Assessment: Report given to RN and Post -op Vital signs reviewed and stable  Post vital signs: Reviewed and stable  Last Vitals:  Vitals Value Taken Time  BP    Temp    Pulse    Resp    SpO2      Last Pain:  Vitals:   11/25/21 0748  TempSrc: Temporal  PainSc:       Patients Stated Pain Goal: 0 (53/69/22 3009)  Complications: No notable events documented.

## 2021-11-25 NOTE — Anesthesia Postprocedure Evaluation (Signed)
Anesthesia Post Note  Patient: [name] [name]  Procedure(s) Performed: CARDIOVERSION     Patient location during evaluation: Endoscopy Anesthesia Type: MAC Level of consciousness: awake Pain management: pain level controlled Vital Signs Assessment: post-procedure vital signs reviewed and stable Respiratory status: spontaneous breathing Cardiovascular status: stable Postop Assessment: no apparent nausea or vomiting Anesthetic complications: no   No notable events documented.  Last Vitals:  Vitals:   11/25/21 0833 11/25/21 0835  BP: (!) 125/98 (!) 125/98  Pulse: 68 74  Resp: (!) 23 (!) 22  Temp:    SpO2: 96% (!) 87%    Last Pain:  Vitals:   11/25/21 0833  TempSrc:   PainSc: 9                  Ladarren Steiner

## 2021-11-25 NOTE — Progress Notes (Signed)
Day of Surgery Procedure(s) (LRB): CARDIOVERSION (N/A) Subjective: Patient resting while on HD- goal to remove 3L Complaints of sig R low back sciatica -like pain WBC now 31k . No assoc fever, cultures negative Cont empiric antibiotics, plan CT chest,abd,pelvis after HD Patient doesnot appear septic  Objective: Vital signs in last 24 hours: Temp:  [97.4 F (36.3 C)-99.3 F (37.4 C)] 98.6 F (37 C) (06/05 1557) Pulse Rate:  [68-93] 72 (06/05 1500) Cardiac Rhythm: Normal sinus rhythm (06/05 1557) Resp:  [15-27] 22 (06/05 1500) BP: (93-139)/(36-98) 133/92 (06/05 1615) SpO2:  [87 %-100 %] 96 % (06/05 1500) Weight:  [122 kg-124 kg] 124 kg (06/05 1556)  Hemodynamic parameters for last 24 hours: CVP:  [11 mmHg-14 mmHg] 11 mmHg  Intake/Output from previous day: 06/04 0701 - 06/05 0700 In: 1637.6 [P.O.:570; I.V.:886.6; IV Piggyback:181] Out: 140 [Urine:140] Intake/Output this shift: Total I/O In: 582.7 [I.V.:11.1; Blood:436; IV Piggyback:135.6] Out: -   EXAM Incisions, central line sites all clean, dry NSR  Mild edema Tender over R hip Lab Results: Recent Labs    11/24/21 0455 11/25/21 0358  WBC 23.1* 31.5*  HGB 8.0* 7.4*  HCT 24.3* 23.1*  PLT 293 310   BMET:  Recent Labs    11/24/21 1543 11/25/21 0358  NA 133* 130*  K 4.3 4.6  CL 94* 94*  CO2 24 21*  GLUCOSE 126* 187*  BUN 46* 52*  CREATININE 4.99* 6.09*  CALCIUM 8.7* 8.5*    PT/INR:  Recent Labs    11/25/21 0358  LABPROT 16.7*  INR 1.4*   ABG    Component Value Date/Time   PHART 7.332 (L) 11/15/2021 0410   HCO3 24.2 11/16/2021 0414   TCO2 26 11/16/2021 0414   ACIDBASEDEF 3.0 (H) 11/16/2021 0414   O2SAT 56.1 11/21/2021 0612   CBG (last 3)  Recent Labs    11/24/21 1940 11/25/21 0353 11/25/21 1107  GLUCAP 191* 181* 298*    Assessment/Plan: S/P Procedure(s) (LRB): CARDIOVERSION (N/A) NSR after cardioversion today Plan CT chest, abd,pelvis w/o contrast  LOS: 13 days    Dahlia Byes 11/25/2021

## 2021-11-25 NOTE — Progress Notes (Signed)
PT Cancellation Note  Patient Details Name: [name] [name] MRN: [id number] DOB: 1955/09/24   Cancelled Treatment:    Reason Eval/Treat Not Completed: Patient at procedure or test/unavailable  Pt off floor getting cardioversion. Will follow.  Marguarite Arbour A Chelli Yerkes 11/25/2021, 7:37 AM Marisa Severin, PT, DPT Acute Rehabilitation Services Secure chat preferred Office [phone]

## 2021-11-25 NOTE — Interval H&P Note (Signed)
History and Physical Interval Note:  11/25/2021 8:06 AM  [name] [name]  has presented today for surgery, with the diagnosis of afib.  The various methods of treatment have been discussed with the patient and family. After consideration of risks, benefits and other options for treatment, the patient has consented to  Procedure(s): CARDIOVERSION (N/A) as a surgical intervention.  The patient's history has been reviewed, patient examined, no change in status, stable for surgery.  I have reviewed the patient's chart and labs.  Questions were answered to the patient's satisfaction.     Samaria Anes [employer]

## 2021-11-25 NOTE — Progress Notes (Signed)
Pt refusing mobility for RN and physical therapy. Pt states he will not do any more mobility until someone addresses his hip pain which has been occurring over the last couple of days. Pt receiving PRN pain medications as needed and is still refusing mobility. MD and PA notified of this and will round when available.

## 2021-11-25 NOTE — Progress Notes (Signed)
Nutrition Follow-up  DOCUMENTATION CODES:   Not applicable  INTERVENTION:  ***   NUTRITION DIAGNOSIS:   Increased nutrient needs related to acute illness as evidenced by estimated needs.  ***  GOAL:   Patient will meet greater than or equal to 90% of their needs  ***  MONITOR:   PO intake, Labs, Weight trends  REASON FOR ASSESSMENT:   Rounds (CRRT, poor po intake)    ASSESSMENT:   66 yo male admitted for elective CABG x 3; developed respiratory distress, AKI on CKD requiring CRRT, post op ileus. PMH includes CAD, CHF, CKD, DM, HTN, OSA  5/23 CABG x 3 with MAZE 5/26 CRRT initiated 5/31 CRRT discontinued 6/01 1st iHD  Recorded po intake 15-100% of meals  Labs: sodium 130 (L), phosphorus 6.0 (H), potassium 4.6 (wdl) Meds: dulcolax suppository, aranesp, colace, ss novolog, levemir, reglan, lactulose, Rena-Vite ***   NUTRITION - FOCUSED PHYSICAL EXAM:  {RD Focused Exam List:21252}  Diet Order:   Diet Order             Diet 2 gram sodium Room service appropriate? Yes; Fluid consistency: Thin; Fluid restriction: 1800 mL Fluid  Diet effective now                   EDUCATION NEEDS:   Education needs have been addressed  Skin:  Skin Assessment: Reviewed RN Assessment  Last BM:  5/30  Height:   Ht Readings from Last 1 Encounters:  11/12/21 5\' 10"  (1.778 m)    Weight:   Wt Readings from Last 1 Encounters:  11/25/21 122 kg    Ideal Body Weight:     BMI:  Body mass index is 38.59 kg/m.  Estimated Nutritional Needs:   Kcal:  2100-2300 kcals  Protein:  115-135 g  Fluid:  1.8 L   Kerman Passey MS, RDN, LDN, CNSC Registered Dietitian III Clinical Nutrition RD Pager and On-Call Pager Number Located in [place]

## 2021-11-25 NOTE — Procedures (Signed)
Electrical Cardioversion Procedure Note [name] [name] [id number] 06/23/1956  Procedure: Electrical Cardioversion Indications:  Atrial Fibrillation  Procedure Details Consent: Risks of procedure as well as the alternatives and risks of each were explained to the (patient/caregiver).  Consent for procedure obtained. Time Out: Verified patient identification, verified procedure, site/side was marked, verified correct patient position, special equipment/implants available, medications/allergies/relevent history reviewed, required imaging and test results available.  Performed  Patient placed on cardiac monitor, pulse oximetry, supplemental oxygen as necessary.  Sedation given:  Propofol per anesthesiology Pacer pads placed anterior and posterior chest.  Cardioverted 3 times with sternal pressure.  Cardioverted at [place].  Evaluation Findings: Post procedure EKG shows: NSR Complications: None Patient did tolerate procedure well.   Loralie Champagne 11/25/2021, 8:18 AM

## 2021-11-25 NOTE — Anesthesia Procedure Notes (Signed)
Procedure Name: General with mask airway Date/Time: 11/25/2021 8:10 AM Performed by: Mariea Clonts, CRNA Pre-anesthesia Checklist: Timeout performed, Patient being monitored, Suction available, Emergency Drugs available and Patient identified Patient Re-evaluated:Patient Re-evaluated prior to induction Oxygen Delivery Method: Ambu bag Preoxygenation: Pre-oxygenation with 100% oxygen Induction Type: IV induction

## 2021-11-25 NOTE — Progress Notes (Signed)
[place] for IV heparin Indication: atrial fibrillation  No Known Allergies  Patient Measurements: Height: 5\' 10"  (177.8 cm) Weight: 122 kg (268 lb 15.4 oz) IBW/kg (Calculated) : 73 Heparin Dosing Weight: ~ 100 kg  Vital Signs: Temp: 98.4 F (36.9 C) (06/05 1137) Temp Source: Oral (06/05 1137) BP: 115/73 (06/05 1300) Pulse Rate: 79 (06/05 1300)  Labs: Recent Labs    11/23/21 0434 11/23/21 1613 11/24/21 0455 11/24/21 1543 11/25/21 0358  HGB 7.9*  --  8.0*  --  7.4*  HCT 23.9*  --  24.3*  --  23.1*  PLT 248  --  293  --  310  LABPROT  --   --   --   --  16.7*  INR  --   --   --   --  1.4*  HEPARINUNFRC 0.42  --  0.40  --  0.31  CREATININE 5.71*   < > 3.38* 4.99* 6.09*   < > = values in this interval not displayed.     Estimated Creatinine Clearance: 15.4 mL/min (A) (by C-G formula based on SCr of 6.09 mg/dL (H)).   Medical History: Past Medical History:  Diagnosis Date   Atrial fibrillation ([place])    Benign essential HTN 07/30/2014   Bradycardia 01/25/2015   CHF (congestive heart failure) (HCC)    Chronic anticoagulation 18/29/9371   Chronic systolic CHF (congestive heart failure), NYHA class 3 ([place]) 08/30/2014   Overview:  Global ef 30%   CKD (chronic kidney disease)    Class 3 severe obesity in adult ([place]) 04/28/2017   Coronary artery disease    Diabetes mellitus without complication (HCC)    Diabetic neuropathy ([place]) 07/28/2014   Dysrhythmia    Erectile dysfunction    High risk medication use 05/26/2018   Hypertension    Hypertensive heart disease with heart failure ([place]) 07/30/2014   Lumbago    LV dysfunction 04/28/2017   Mixed hyperlipidemia 07/28/2014   OSA (obstructive sleep apnea)    CPAP   Paroxysmal atrial fibrillation ([place]) 07/28/2014   Sinus node dysfunction ([place]) 04/21/2016   Testicular hypofunction    Uncontrolled type 2 diabetes mellitus with microalbuminuric diabetic nephropathy 07/28/2014    Assessment: 66 yo male on chronic Xarelto for afib.  Now s/p CABG with MAZE, and on CRRT.  Remains in afib.  Pharmacy asked to dose IV heparin. Targeting lowering heparin level goal given recent OHS.  Heparin level remains therapeutic at 0.3, H/H and pltc stable.  S/P DCCV on 6/5> SR on amiodarone drip  Convert to oral anticoagulant when procedures complete  Goal of Therapy:  Heparin level 0.3-0.5 Monitor platelets by anticoagulation protocol: Yes   Plan:  Continue heparin 1700 units/hr Daily heparin level and CBC    [name] Pharm.D. CPP, BCPS Clinical Pharmacist [phone] 11/25/2021 1:59 PM   **Pharmacist phone directory can now be found on [email or web address] ([email or web address]).  Listed under [place].

## 2021-11-25 NOTE — Progress Notes (Addendum)
Patient ID: [name] [name], male   DOB: 1955/10/01, 66 y.o.   MRN: [id number]     Advanced Heart Failure Rounding Note  PCP-Cardiologist: None   Subjective:    iHD yesterday, CVP 10-11   Abx started 5/25 for suspected septic shock.  Initial abx completed and afebrile, but WBCs up, 17>>19.8>>22.2>>25>>23>>31.5 . CCM added rocephin 06/01 for possible aspiration. On 6/2, abx transitioned to vanc/cefepime and micafungin added (yeast in resp culture).   S/p TEE/DCCV to SR 06/01. Remains in AF.  Left thoracentesis 6/2 with 900 cc off.   Echo: Difficult study. LV EF 55-60%, RV probably normal, no pericardial effusion.   Complaining of leg pain. Able on the unit.    Objective:   Weight Range: 122 kg Body mass index is 38.59 kg/m.   Vital Signs:   Temp:  [97.8 F (36.6 C)-99.3 F (37.4 C)] 97.8 F (36.6 C) (06/05 0400) Pulse Rate:  [78-107] 80 (06/05 0700) Resp:  [14-27] 16 (06/05 0700) BP: (88-135)/(48-78) 114/54 (06/05 0700) SpO2:  [90 %-100 %] 98 % (06/05 0700) Weight:  [121.4 kg-122 kg] 122 kg (06/05 0410) Last BM Date : 11/24/21  Weight change: Filed Weights   11/23/21 0500 11/24/21 0800 11/25/21 0410  Weight: 121.1 kg 121.4 kg 122 kg    Intake/Output:   Intake/Output Summary (Last 24 hours) at 11/25/2021 0745 Last data filed at 11/25/2021 0700 Gross per 24 hour  Intake 1637.57 ml  Output 140 ml  Net 1497.57 ml      Physical Exam  CVP 10-11  General:  Well appearing. No resp difficulty HEENT: normal Neck: supple. no JVD. Carotids 2+ bilat; no bruits. No lymphadenopathy or thryomegaly appreciated. Cor: PMI nondisplaced. Irregular rate & rhythm. No rubs, gallops or murmurs. Lungs: clear on [place] 1-2 liters Abdomen: soft, nontender, nondistended. No hepatosplenomegaly. No bruits or masses. Good bowel sounds. Extremities: no cyanosis, clubbing, rash, edema Neuro: alert & orientedx3, cranial nerves grossly intact. moves all 4 extremities w/o difficulty. Affect flat    Telemetry   A fib 70-100s    Labs    CBC Recent Labs    11/24/21 0455 11/25/21 0358  WBC 23.1* 31.5*  HGB 8.0* 7.4*  HCT 24.3* 23.1*  MCV 92.0 93.1  PLT 293 258   Basic Metabolic Panel Recent Labs    11/24/21 0455 11/24/21 1543 11/25/21 0358  NA 134* 133* 130*  K 3.8 4.3 4.6  CL 94* 94* 94*  CO2 27 24 21*  GLUCOSE 112* 126* 187*  BUN 33* 46* 52*  CREATININE 3.38* 4.99* 6.09*  CALCIUM 8.6* 8.7* 8.5*  MG 1.8  --  2.0  PHOS 3.3 5.2* 6.0*   Liver Function Tests Recent Labs    11/22/21 2239 11/23/21 0434 11/24/21 1543 11/25/21 0358  PROT 6.6  --   --   --   ALBUMIN  --    < > 3.1* 2.9*   < > = values in this interval not displayed.   No results for input(s): LIPASE, AMYLASE in the last 72 hours. Cardiac Enzymes No results for input(s): CKTOTAL, CKMB, CKMBINDEX, TROPONINI in the last 72 hours.  BNP: BNP (last 3 results) No results for input(s): BNP in the last 8760 hours.  ProBNP (last 3 results) No results for input(s): PROBNP in the last 8760 hours.   D-Dimer No results for input(s): DDIMER in the last 72 hours. Hemoglobin A1C No results for input(s): HGBA1C in the last 72 hours. Fasting Lipid Panel No results for input(s): CHOL, HDL,  LDLCALC, TRIG, CHOLHDL, LDLDIRECT in the last 72 hours. Thyroid Function Tests No results for input(s): TSH, T4TOTAL, T3FREE, THYROIDAB in the last 72 hours.  Invalid input(s): FREET3  Other results:   Imaging    No results found.   Medications:     Scheduled Medications:  [MAR Hold] aspirin EC  325 mg Oral Daily   Or   [MAR Hold] aspirin  324 mg Per Tube Daily   [MAR Hold] bisacodyl  10 mg Rectal Q0600   [MAR Hold] Chlorhexidine Gluconate Cloth  6 each Topical Daily   [MAR Hold] darbepoetin (ARANESP) injection - DIALYSIS  100 mcg Intravenous Q Mon-HD   [MAR Hold] docusate sodium  200 mg Oral Daily   [MAR Hold] guaiFENesin  1,200 mg Oral BID   [MAR Hold] insulin aspart  0-24 Units Subcutaneous TID  WC   [MAR Hold] insulin detemir  16 Units Subcutaneous Q12H   [MAR Hold] lactulose  10 g Oral Daily   [MAR Hold] lidocaine  2 patch Transdermal Q24H   [MAR Hold] metoCLOPramide (REGLAN) injection  5 mg Intravenous Q8H   [MAR Hold] midodrine  15 mg Oral TID WC   [MAR Hold] midodrine  15 mg Oral Once   [MAR Hold] multivitamin  1 tablet Oral QHS   [MAR Hold] pantoprazole  40 mg Oral Daily   [MAR Hold] rosuvastatin  20 mg Oral Daily   [MAR Hold] sodium chloride flush  3 mL Intravenous Q12H    Infusions:  sodium chloride Stopped (11/14/21 0549)   sodium chloride     sodium chloride     sodium chloride     sodium chloride 500 mL (11/25/21 0743)   amiodarone 30 mg/hr (11/25/21 0700)   [MAR Hold] anticoagulant sodium citrate     [MAR Hold] ceFEPime (MAXIPIME) IV Stopped (11/24/21 1045)   heparin 1,700 Units/hr (11/25/21 0700)   lactated ringers Stopped (11/18/21 2130)   [MAR Hold] micafungin (MYCAMINE) IV 108 mL/hr at 11/24/21 1000   [MAR Hold] vancomycin      PRN Medications: sodium chloride, [MAR Hold] acetaminophen, [MAR Hold] alteplase, [MAR Hold] anticoagulant sodium citrate, [MAR Hold] heparin, [MAR Hold] heparin, [MAR Hold]  HYDROmorphone (DILAUDID) injection, [MAR Hold] ipratropium-albuterol, [MAR Hold] lidocaine (PF), [MAR Hold] lidocaine-prilocaine, [MAR Hold] ondansetron (ZOFRAN) IV, [MAR Hold] oxyCODONE, [MAR Hold] pentafluoroprop-tetrafluoroeth, [MAR Hold] sodium chloride, [MAR Hold] sodium chloride flush, [MAR Hold] traMADol    Patient Profile   66 y.o. male with history of CM with recovered EF, persistent atrial fibrillation, DM II, CKD, sick sinus syndrome, OSA, prior smoker. Underwent CABG X 3 and MAZE/PVI on 05/23>>post-op decrease in UOP and AKI with volume overload>>postcardiotomy shock requiring inotrope and pressor support  Assessment/Plan   1. Shock: Echo reviewed, LV EF 55-60%, RV poorly visualized but probably near-normal, no significant pericardial effusion.  Suspect sepsis physiology with distributive shock.  Improved with broad spectrum abx. Lactate has cleared. Bcx 5/25 remain NGTD. Now off pressors/resolved.   - Continue midodrine to 15 mg TID 2. Acute on chronic primarily diastolic CHF: LV EF 56-38%, RV poorly visualized but probably near-normal, no significant pericardial effusion.  Patient is still volume overloaded, suspect primarily right heart failure physiology.  CVVHD started on 5/26. Now getting iHD.  - Discussed with nephrology, will get HD tomorrow. 3. AKI on CKD stage 3: Baseline SCr 1.3-1.5 .  Suspect due to shock/ATN as well as elevated renal venous pressure in setting of volume overload, ileus, abdominal distention. CVVHD started 5/26. Remains nearly anuric.  Lost  HD line 5/31, replaced 06/01. Now getting iHD.  4. CAD: S/p CABG. No chest pain.  - Continue ASA, statin.  5. Atrial fibrillation with RVR: Has had Maze and LA appendage clip at time of CABG on 11/12/21. S/p TEE/DCCV to SR on 06//01. Back in AF by 6/2.  In AF with HR 90s today. - Continue heparin gtt - Continue amio gtt  30/hr - Plan for DCCV this morning. Discussed risks/benefits with patient and he agreed to procedure.   6. DM2: SSI.  7. ID: WBCs 22>>16K > 14K -> 12.8 -> 16 -> 17 -> 19.8 -> 22 -> 25 -> 23->31.5.   Septic shock initially, resolved.  Blood cultures NGTD.  Treated for HCAP, completed vanc and cefepime.  With WBCs rising again and concern for aspiration, he is back on vanc/cefepime and micafungin given yeast in sputum.  - Continue vanc/cefepime/micafungin per pharmacy.  - May need to involve ID.  8. Ileus: Improved.  9. Pulmonary: Respiratory status improving with fluid removal and abx. Thoracentesis on 6/2.  - HD for volume removal  - CCM following.    Length of Stay: [place], NP  11/25/2021, 7:45 AM  Advanced Heart Failure Team Pager [phone] (M-F; 7a - 5p)  Please contact [facility] for night-coverage after hours (5p -7a ) and weekends on  [email or web address]  Patient seen with NP, agree with the above note.   Patient had cardioversion back to NSR today.  CVP 10-11. Main complaint is back pain.   General: NAD Neck: JVP 10 cm, no thyromegaly or thyroid nodule.  Lungs: Clear to auscultation bilaterally with normal respiratory effort. CV: Nondisplaced PMI.  Heart irregular S1/S2, no S3/S4, no murmur.  1+ ankle edema.  Abdomen: Soft, nontender, no hepatosplenomegaly, no distention.  Skin: Intact without lesions or rashes.  Neurologic: Alert and oriented x 3.  Psych: Normal affect. Extremities: No clubbing or cyanosis.  HEENT: Normal.   Patient now back in NSR, continue heparin gtt (eventual transition to Eliquis) and amiodarone gtt (transition to po tomorrow if remains in NSR).   Patient will get HD today for volume management.   WBCs still significantly elevated.  He remains on vanc/cefepime and micafungin.  Per CCM.   Loralie Champagne 11/25/2021 8:22 AM

## 2021-11-25 NOTE — Progress Notes (Signed)
NAME:  [name] [name], MRN:  [id number], DOB:  [birth year], LOS: 66 ADMISSION DATE:  11/12/2021, CONSULTATION DATE:  11/13/2021 REFERRING MD:  Aundra Dubin, CHIEF COMPLAINT:  Dyspnea   History of Present Illness:  66 year old man admitted for CABG and MAZE on 5/23. PMHx significant for HTN, CAD, CHF, Afib (on Xarelto), OSA, T2DM, CKD.  Underwent CABG/MAZE 5/23. Postoperative course complicated by acute hypoxemic respiratory failure and renal failure requiring hemodialysis.  PCCM consulted for management of respiratory failure.  Pertinent Medical History:  CAD (s/p CABG 07/13/9756), systolic CHF, CKD, I3GP, HTN, A-fib chronically AC (Xarelto), OSA  Significant Hospital Events: Including procedures, antibiotic start and stop dates in addition to other pertinent events   5/23 Underwent CABG with MAZE procedure with Dr. Lawson Fiscal 5/24 PCCM consulted for progressive increased work of breathing  5/25 Urine output remained low overnight along with soft BP which prompted addition of vaso and low-dose bicarb drip  5/26 CRRT started 5/31 CRRT stopped with dislodged catheter.   6/1 Cardioversion. On RA, off vasopressors. New HD cath placed. 6/2 Left thoracentesis, 900cc fluid removed: exudative, LDH 544, 707 WBC (lymph, PMN, Monocytes essentially all the same); antibiotics broadened given worsening leukocytosis 6/4 Persistent AFib 6/5 Cardioversion, 3 x 200J with resultant NSR.  Interim History / Subjective:  No significant events overnight TEE/DCCV this morning with successful cardioversion to NSR C/o posterior L hip pain this morning, radiating to lateral/anterior hip No other complaints at present  Objective:  Blood pressure (!) 125/98, pulse 68, temperature (!) 97.4 F (36.3 C), temperature source Temporal, resp. rate (!) 23, height 5\' 10"  (1.778 m), weight 122 kg, SpO2 96 %. CVP:  [10 mmHg-14 mmHg] 11 mmHg      Intake/Output Summary (Last 24 hours) at 11/25/2021 0839 Last data filed at 11/25/2021  0700 Gross per 24 hour  Intake 1637.57 ml  Output 140 ml  Net 1497.57 ml    Filed Weights   11/23/21 0500 11/24/21 0800 11/25/21 0410  Weight: 121.1 kg 121.4 kg 122 kg   Physical Examination: General: Acute-on-chronically ill-appearing middle-aged man in NAD. Up to chair. HEENT: [place]/AT, anicteric sclera, PERRL, dry mucous membranes. Neuro: Awake, oriented x 4. Responds to verbal stimuli. Following commands consistently. Moves all 4 extremities spontaneously. CV: RRR, no m/g/r. Midline sternotomy incision c/d/i without drainage/erythema, open to air. PULM: Breathing even and unlabored on 2LNC. Lung fields diminished at bases bilaterally, no wheeze/rhonchi/rales. GI: Soft, nontender, mildly distended. Hypoactive bowel sounds. Extremities: No significant LE edema noted. Skin: Warm/dry, no rashes.  Resolved Hospital Problem List:   Acute hypoxic respiratory failure Constipation Ileus  Assessment & Plan:  CAD s/p CABG 5/23 - Postoperative management per TCTS - Advanced HF team following  Cardiogenic and septic shock > resolved Acute on chronic HFrEF - Management per Advanced HF team - Monitoring I&Os  Chronic atrial fibrillation s/p MAZE and LAA appendage clip Underwent successful cardioversion 6/5AM (3 x 200J with resultant NSR). History of Afib on Xarelto as an outpatient. - Amiodarone gtt per HF team - Heparin gtt - Cardiac monitoring  L pleural effusion, suspect reactive postoperatively S/p thoracentesis 6/2 with 918mL removed; fluid studies consistent with transudative effusion. - Intermittent CXR for monitoring/if clinically worsening - Unlikely source of leukocytosis, given transudative properties - CXR with improving L suprahilar opacity, no significant effusion  Leukocytosis, unclear etiology; may have HCAP but normal oxygenation and CXR reassuring; lines all new, no foley - Trend WBC, fever curve - Continue current broad-spectrum antibiotics, given rising WBC -  Vanc,  cefepime, micafungin - Follow Cx data - Unclear source at this juncture; less likely lung given thora fluid studies transudative and improved CXR/oxygenation  OSA - Continue BiPAP/CPAP support QHS - Wean FiO2 for O2 sat > 90% - Pulmonary hygiene, IS/flutter  DM2 - CBGs Q4H  - Goal CBG 140-180 - SSI  Oliguric AKI - Nephrology following - iHD per Nephrology - Trend BMP - Replete electrolytes as indicated - Monitor I&Os - Avoid nephrotoxic agents as able  Anemia, likely 2/2 critical illness in the setting of recent surgery, HD - Trend H&H - Monitor on heparin gtt - Transfuse for Hgb < 7.0 or hemodynamically significant bleeding; decision to transfuse per primary team  Left hip pain Not present prior to admission, describes as radiating to lateral/anterior hip, "feels like nerve pain". - Consider gabapentin or similar, caution given renal failure - Encourage mobility/ambulation with PT  Best Practice: (right click and "Reselect all SmartList Selections" daily)   Per Primary Team  Critical care time: N/A   Rhae Lerner [facility] 11/25/21 8:55 AM  Please see [email or web address] for pager details.  From 7A-7P if no response, please call [phone] After hours, please call ELink [phone]

## 2021-11-25 NOTE — Progress Notes (Signed)
RT placed patient on BIPAP at this time. Patient is tolerating settings well. RT will monitor as needed. 

## 2021-11-25 NOTE — Consult Note (Addendum)
   [facility] CM Inpatient Consult   11/25/2021  Tauheed Mcfayden [birth year] [id number]  [place] [facility] [ACO] Patient: [employer]  [facility] pending status:  noted   Primary Care Provider:  Fae Pippin, [place], is an embedded provider with a Chronic Care Management team and program, and is listed for the transition of care follow up and appointments.  Patient was screened for Embedded practice service needs for chronic care management and notes a referral from the diabetes coordinator has been requested for post hospital care management.   Plan: Patient has been assigned a Max team NP-G as patient's provider has Embedded Pharmacist for CCM aware of TOC needs for post hospital needs. Continue to follow for disposition as patient remains in ICU at this time.  Please contact for further questions,  Natividad Brood, RN BSN [facility] Liaison  [phone] business mobile phone Toll free office [phone]  Fax number: [phone] [email or web address]

## 2021-11-26 ENCOUNTER — Ambulatory Visit (HOSPITAL_COMMUNITY): Payer: Medicaid Other | Admitting: Physician Assistant

## 2021-11-26 ENCOUNTER — Encounter (HOSPITAL_COMMUNITY): Payer: Self-pay | Admitting: Cardiology

## 2021-11-26 ENCOUNTER — Inpatient Hospital Stay (HOSPITAL_COMMUNITY): Payer: Medicare HMO

## 2021-11-26 DIAGNOSIS — Z992 Dependence on renal dialysis: Secondary | ICD-10-CM | POA: Diagnosis not present

## 2021-11-26 DIAGNOSIS — Z951 Presence of aortocoronary bypass graft: Secondary | ICD-10-CM | POA: Diagnosis not present

## 2021-11-26 DIAGNOSIS — N179 Acute kidney failure, unspecified: Secondary | ICD-10-CM | POA: Diagnosis not present

## 2021-11-26 DIAGNOSIS — D72829 Elevated white blood cell count, unspecified: Secondary | ICD-10-CM

## 2021-11-26 DIAGNOSIS — I480 Paroxysmal atrial fibrillation: Secondary | ICD-10-CM | POA: Diagnosis not present

## 2021-11-26 HISTORY — DX: Elevated white blood cell count, unspecified: D72.829

## 2021-11-26 LAB — RENAL FUNCTION PANEL
Albumin: 2.5 g/dL — ABNORMAL LOW (ref 3.5–5.0)
Anion gap: 16 — ABNORMAL HIGH (ref 5–15)
BUN: 35 mg/dL — ABNORMAL HIGH (ref 8–23)
CO2: 23 mmol/L (ref 22–32)
Calcium: 8.2 mg/dL — ABNORMAL LOW (ref 8.9–10.3)
Chloride: 91 mmol/L — ABNORMAL LOW (ref 98–111)
Creatinine, Ser: 4.92 mg/dL — ABNORMAL HIGH (ref 0.61–1.24)
GFR, Estimated: 12 mL/min — ABNORMAL LOW (ref >60)
Glucose, Bld: 241 mg/dL — ABNORMAL HIGH (ref 70–99)
Phosphorus: 5.2 mg/dL — ABNORMAL HIGH (ref 2.5–4.6)
Potassium: 4.1 mmol/L (ref 3.5–5.1)
Sodium: 130 mmol/L — ABNORMAL LOW (ref 135–145)

## 2021-11-26 LAB — HEPARIN LEVEL (UNFRACTIONATED)
Heparin Unfractionated: 0.17 IU/mL — ABNORMAL LOW (ref 0.30–0.70)
Heparin Unfractionated: 0.21 IU/mL — ABNORMAL LOW (ref 0.30–0.70)
Heparin Unfractionated: 0.15 IU/mL — ABNORMAL LOW (ref 0.30–0.70)

## 2021-11-26 LAB — CBC
HCT: 23.8 % — ABNORMAL LOW (ref 39.0–52.0)
HCT: 24 % — ABNORMAL LOW (ref 39.0–52.0)
HCT: 26.2 % — ABNORMAL LOW (ref 39.0–52.0)
Hemoglobin: 7.6 g/dL — ABNORMAL LOW (ref 13.0–17.0)
Hemoglobin: 7.6 g/dL — ABNORMAL LOW (ref 13.0–17.0)
Hemoglobin: 8.5 g/dL — ABNORMAL LOW (ref 13.0–17.0)
MCH: 29.7 pg (ref 26.0–34.0)
MCH: 30 pg (ref 26.0–34.0)
MCH: 29.6 pg (ref 26.0–34.0)
MCHC: 31.7 g/dL (ref 30.0–36.0)
MCHC: 31.9 g/dL (ref 30.0–36.0)
MCHC: 32.4 g/dL (ref 30.0–36.0)
MCV: 93 fL (ref 80.0–100.0)
MCV: 94.9 fL (ref 80.0–100.0)
MCV: 91.3 fL (ref 80.0–100.0)
Platelets: 287 10*3/uL (ref 150–400)
Platelets: 293 10*3/uL (ref 150–400)
Platelets: 293 10*3/uL (ref 150–400)
RBC: 2.53 MIL/uL — ABNORMAL LOW (ref 4.22–5.81)
RBC: 2.56 MIL/uL — ABNORMAL LOW (ref 4.22–5.81)
RBC: 2.87 MIL/uL — ABNORMAL LOW (ref 4.22–5.81)
RDW: 15.5 % (ref 11.5–15.5)
RDW: 15.6 % — ABNORMAL HIGH (ref 11.5–15.5)
RDW: 15.5 % (ref 11.5–15.5)
WBC: 25.2 10*3/uL — ABNORMAL HIGH (ref 4.0–10.5)
WBC: 25.4 10*3/uL — ABNORMAL HIGH (ref 4.0–10.5)
WBC: 25.2 10*3/uL — ABNORMAL HIGH (ref 4.0–10.5)
nRBC: 0 % (ref 0.0–0.2)
nRBC: 0 % (ref 0.0–0.2)
nRBC: 0 % (ref 0.0–0.2)

## 2021-11-26 LAB — DIFFERENTIAL
Abs Immature Granulocytes: 0.64 10*3/uL — ABNORMAL HIGH (ref 0.00–0.07)
Basophils Absolute: 0.1 10*3/uL (ref 0.0–0.1)
Basophils Relative: 0 %
Eosinophils Absolute: 0.3 10*3/uL (ref 0.0–0.5)
Eosinophils Relative: 1 %
Immature Granulocytes: 3 %
Lymphocytes Relative: 6 %
Lymphs Abs: 1.4 10*3/uL (ref 0.7–4.0)
Monocytes Absolute: 2.8 10*3/uL — ABNORMAL HIGH (ref 0.1–1.0)
Monocytes Relative: 11 %
Neutro Abs: 20.1 10*3/uL — ABNORMAL HIGH (ref 1.7–7.7)
Neutrophils Relative %: 79 %

## 2021-11-26 LAB — GLUCOSE, CAPILLARY
Glucose-Capillary: 234 mg/dL — ABNORMAL HIGH (ref 70–99)
Glucose-Capillary: 220 mg/dL — ABNORMAL HIGH (ref 70–99)
Glucose-Capillary: 194 mg/dL — ABNORMAL HIGH (ref 70–99)
Glucose-Capillary: 224 mg/dL — ABNORMAL HIGH (ref 70–99)
Glucose-Capillary: 276 mg/dL — ABNORMAL HIGH (ref 70–99)

## 2021-11-26 LAB — PREPARE RBC (CROSSMATCH)

## 2021-11-26 LAB — MAGNESIUM: Magnesium: 1.8 mg/dL (ref 1.7–2.4)

## 2021-11-26 MED ORDER — AMIODARONE HCL 200 MG PO TABS
400.0000 mg | ORAL_TABLET | Freq: Two times a day (BID) | ORAL | Status: DC
  Administered 2021-11-26 – 2021-11-27 (×4): 400 mg via ORAL
  Filled 2021-11-26 (×4): qty 2

## 2021-11-26 MED ORDER — INSULIN DETEMIR 100 UNIT/ML ~~LOC~~ SOLN
18.0000 [IU] | Freq: Two times a day (BID) | SUBCUTANEOUS | Status: DC
  Administered 2021-11-26: 18 [IU] via SUBCUTANEOUS
  Filled 2021-11-26 (×3): qty 0.18

## 2021-11-26 MED ORDER — SODIUM CHLORIDE 0.9 % IV SOLN
3.0000 g | INTRAVENOUS | Status: AC
  Administered 2021-11-26 – 2021-11-30 (×5): 3 g via INTRAVENOUS
  Filled 2021-11-26 (×5): qty 8

## 2021-11-26 MED ORDER — HEPARIN (PORCINE) 25000 UT/250ML-% IV SOLN
2000.0000 [IU]/h | INTRAVENOUS | Status: AC
Start: 2021-11-26 — End: 2021-11-27

## 2021-11-26 NOTE — Progress Notes (Signed)
Physical Therapy Treatment Patient Details Name: [name] [name] MRN: [id number] DOB: 09/24/1955 Today's Date: 11/26/2021   History of Present Illness Pt is a 66 y.o. male admitted 11/12/21 for multivessel CABG with Maze procedure; extubated 5/24. Postop course complicated by oliguria and NSVT vs. Afib/RVR, shock, worsening respiratory status, suspected ileus. Pt also with worsening renal function; CRRT initiated 5/26. S/p cardioversion 6/1. S/p L thoracentesis 6/2. Persistent afib 6/4; s/p cardioversion 6/5. CT chest/abd/pelvis with enlargement L psoas muscle (hemorrhage vs abscess), bilateral lower lobe infiltrates vs atelectasis, L pleural effusion. Plan for L psoas aspiration 6/7. PMH includes CAD, CHF, CKD, DM2, HTN, afib (chronic anticoagulation), OSA.   PT Comments    Pt slowly progressing with mobility; pt continues to self-limit. Pt tolerated brief bout of hallway ambulation before adamantly declining additional distance or in-room activity. Pt repeats, "Don't push me girl" when encouraged and educ on importance of activity progression; pt attributes limitations to fatigue and pain. Pt continues to require encouragement to perform tasks as indep as possible. Will continue to follow acutely to address established goals.    Recommendations for follow up therapy are one component of a multi-disciplinary discharge planning process, led by the attending physician.  Recommendations may be updated based on patient status, additional functional criteria and insurance authorization.  Follow Up Recommendations  Home health PT     Assistance Recommended at Discharge Intermittent Supervision/Assistance  Patient can return home with the following A little help with bathing/dressing/bathroom;Assistance with cooking/housework;Assist for transportation;Help with stairs or ramp for entrance   Equipment Recommendations  Rollator (4 wheels) (bariatric-sized)    Recommendations for Other Services  N/A      Precautions / Restrictions Precautions Precautions: Fall;Sternal     Mobility  Bed Mobility               General bed mobility comments: received sitting on toilet    Transfers Overall transfer level: Needs assistance Equipment used: None, Rollator (4 wheels) Transfers: Sit to/from Stand Sit to Stand: Supervision           General transfer comment: multiple sit<>stands from low toilet height, rollator seat and recliner with supervision for safety and intermittent min cues to maintain sternal precautions    Ambulation/Gait Ambulation/Gait assistance: Supervision Gait Distance (Feet): 95 Feet Assistive device: None, Rollator (4 wheels) Gait Pattern/deviations: Step-through pattern, Decreased stride length, Trunk flexed Gait velocity: Decreased     General Gait Details: short ambulation in bathroom without DME, min guard for balance; additional hallway ambulation with rollator, supervision for safety; pt adamantly declining further distance despite max encouragement ("don't push me girl... I'm not doing more... I can't do more...")   Stairs             Wheelchair Mobility    Modified Rankin (Stroke Patients Only)       Balance Overall balance assessment: Needs assistance Sitting-balance support: No upper extremity supported, Feet supported Sitting balance-Leahy Scale: Fair Sitting balance - Comments: requires assist to don shoes and boxers/pants; able to perform posterior pericare sitting on toilet (requires encouragement to do so without assist)   Standing balance support: Bilateral upper extremity supported, No upper extremity supported, During functional activity Standing balance-Leahy Scale: Fair Standing balance comment: can static stand and take steps without UE support; static and dynamic stability improved with UE support                            Cognition Arousal/Alertness: Awake/alert Behavior  During Therapy: WFL for tasks  assessed/performed, Flat affect Overall Cognitive Status: Within Functional Limits for tasks assessed                                 General Comments: self-limiting activity progression with c/o fatigue and pain; pt repeats "don't push me girl" when encouraged to mobilize further, do therex, etc; requires encouragement to complete tasks as indep as possible        Exercises      General Comments General comments (skin integrity, edema, etc.): pt continues to self-limit activity progression - agreeable to hallway ambulation, but adamantly declines further distance, also adamantly declining additional activity/exercise upon return to room. pt with vomiting post-ambulation, RN notified      Pertinent Vitals/Pain Pain Assessment Pain Assessment: Faces Faces Pain Scale: Hurts little more Pain Location: L hip/groin, back Pain Descriptors / Indicators: Discomfort, Sore Pain Intervention(s): Monitored during session, Limited activity within patient's tolerance    Home Living                          Prior Function            PT Goals (current goals can now be found in the care plan section) Progress towards PT goals: Progressing toward goals (slowly)    Frequency    Min 3X/week      PT Plan Current plan remains appropriate    Co-evaluation              AM-PAC PT "6 Clicks" Mobility   Outcome Measure  Help needed turning from your back to your side while in a flat bed without using bedrails?: A Little Help needed moving from lying on your back to sitting on the side of a flat bed without using bedrails?: A Little Help needed moving to and from a bed to a chair (including a wheelchair)?: A Little Help needed standing up from a chair using your arms (e.g., wheelchair or bedside chair)?: A Little Help needed to walk in hospital room?: A Little Help needed climbing 3-5 steps with a railing? : A Little 6 Click Score: 18    End of Session    Activity Tolerance: Other (comment) (self-limiting) Patient left: in chair;with call bell/phone within reach Nurse Communication: Mobility status PT Visit Diagnosis: Other abnormalities of gait and mobility (R26.89);Muscle weakness (generalized) (M62.81);Unsteadiness on feet (R26.81);Difficulty in walking, not elsewhere classified (R26.2)     Time: 9211-9417 PT Time Calculation (min) (ACUTE ONLY): 22 min  Charges:  $Therapeutic Exercise: 8-22 mins                     Mabeline Caras, PT, DPT Acute Rehabilitation Services  Pager [phone] Office [place] 11/26/2021, 4:35 PM

## 2021-11-26 NOTE — Progress Notes (Signed)
Patient ID: [name] [name], male   DOB: 08/19/1955, 66 y.o.   MRN: [id number] S: Tolerated HD well with 3 liters UF.  Left hip/back pain has been intermittent. O:BP (!) 114/47   Pulse 77   Temp 98.7 F (37.1 C) (Oral)   Resp 16   Ht 5\' 10"  (1.778 m)   Wt 121.3 kg   SpO2 90%   BMI 38.37 kg/m   Intake/Output Summary (Last 24 hours) at 11/26/2021 1031 Last data filed at 11/26/2021 0900 Gross per 24 hour  Intake 1954.07 ml  Output 3000 ml  Net -1045.93 ml   Intake/Output: I/O last 3 completed shifts: In: 2461.9 [P.O.:810; I.V.:1001; Blood:436; IV Piggyback:214.9] Out: 7341 [Urine:140; Other:3000]  Intake/Output this shift:  Total I/O In: 66.9 [I.V.:66.9] Out: -  Weight change: 2.6 kg Gen:NAD CVS: RRR Resp:CTA Abd: +BS, soft, NT/Nd Ext: trace - 1+ pretibial edema  Recent Labs  Lab 11/23/21 0434 11/23/21 1613 11/24/21 0455 11/24/21 1543 11/25/21 0358 11/25/21 2210 11/26/21 0436  NA 131* 128* 134* 133* 130* 127* 130*  K 4.2 4.6 3.8 4.3 4.6 3.6 4.1  CL 96* 93* 94* 94* 94* 88* 91*  CO2 20* 18* 27 24 21* 22 23  GLUCOSE 101* 267* 112* 126* 187* 201* 241*  BUN 60* 69* 33* 46* 52* 31* 35*  CREATININE 5.71* 6.36* 3.38* 4.99* 6.09* 4.01* 4.92*  ALBUMIN 3.0* 3.0* 3.4* 3.1* 2.9* 2.8* 2.5*  CALCIUM 8.2* 8.2* 8.6* 8.7* 8.5* 7.9* 8.2*  PHOS 7.6* 7.7* 3.3 5.2* 6.0* 3.9 5.2*   Liver Function Tests: Recent Labs  Lab 11/22/21 2239 11/23/21 0434 11/25/21 0358 11/25/21 2210 11/26/21 0436  PROT 6.6  --   --   --   --   ALBUMIN  --    < > 2.9* 2.8* 2.5*   < > = values in this interval not displayed.   No results for input(s): LIPASE, AMYLASE in the last 168 hours. No results for input(s): AMMONIA in the last 168 hours. CBC: Recent Labs  Lab 11/22/21 0455 11/23/21 0434 11/24/21 0455 11/25/21 0358 11/26/21 0436  WBC 22.2* 24.7* 23.1* 31.5* 25.4*  HGB 8.4* 7.9* 8.0* 7.4* 7.6*  HCT 26.7* 23.9* 24.3* 23.1* 23.8*  MCV 94.7 94.1 92.0 93.1 93.0  PLT 253 248 293 310 287    Cardiac Enzymes: No results for input(s): CKTOTAL, CKMB, CKMBINDEX, TROPONINI in the last 168 hours. CBG: Recent Labs  Lab 11/25/21 1107 11/25/21 1720 11/25/21 2043 11/26/21 0438 11/26/21 0641  GLUCAP 298* 222* 191* 234* 220*    Iron Studies: No results for input(s): IRON, TIBC, TRANSFERRIN, FERRITIN in the last 72 hours. Studies/Results: DG Chest Port 1 View  Result Date: 11/26/2021 CLINICAL DATA:  Postoperative CABG EXAM: PORTABLE CHEST 1 VIEW COMPARISON:  11/25/2021, 11/23/2021 FINDINGS: Right-sided PICC line and left IJ central venous catheter are stable in positioning. Prior sternotomy and CABG. Stable cardiomegaly. Aortic atherosclerosis. Left basilar opacity, increased from 11/23/2021. Loculated left-sided pleural effusion, better seen on recent CT. Right lung remains clear. No pneumothorax. IMPRESSION: Persistent left basilar opacity with loculated left-sided pleural effusion, better seen on recent CT. Electronically Signed   By: Davina Poke D.O.   On: 11/26/2021 08:45   CT CHEST ABDOMEN PELVIS WO CONTRAST  Result Date: 11/25/2021 CLINICAL DATA:  Sepsis, elevated WBC. EXAM: CT CHEST, ABDOMEN AND PELVIS WITHOUT CONTRAST TECHNIQUE: Multidetector CT imaging of the chest, abdomen and pelvis was performed following the standard protocol without IV contrast. RADIATION DOSE REDUCTION: This exam was performed according to the  departmental dose-optimization program which includes automated exposure control, adjustment of the mA and/or kV according to patient size and/or use of iterative reconstruction technique. COMPARISON:  None Available. FINDINGS: CT CHEST FINDINGS Cardiovascular: Heart is normal in size and there is a hyperdense pericardial effusion measuring up to 1.3 cm in thickness. Three-vessel coronary artery calcifications are noted. There is atherosclerotic calcification of the aorta without evidence of aneurysm. Pulmonary trunk is mildly distended which may be associated with  underlying pulmonary artery hypertension. The distal tip of a central venous catheter terminates in the superior vena cava. Mediastinum/Nodes: No mediastinal or axillary lymphadenopathy by size criteria. Evaluation of the hila is limited due to lack of IV contrast. The thyroid gland and esophagus are within normal limits. A small amount of debris is noted in the trachea. Lungs/Pleura: Mild atelectasis or infiltrate is noted in the lower lobes bilaterally. There is a small pleural effusion on the right and a moderate loculated pleural effusion on the left. No pneumothorax. A few scattered pulmonary nodules are noted on the right measuring up to 3 mm, axial image 65. Musculoskeletal: Sternotomy wires are noted in the midline. No acute or suspicious osseous abnormality. CT ABDOMEN PELVIS FINDINGS Hepatobiliary: Subcentimeter hypodensity is present in the anterior right lobe of the liver which is too small to further characterize. The gallbladder is without stones. No biliary ductal dilatation. Pancreas: Unremarkable. No pancreatic ductal dilatation or surrounding inflammatory changes. Spleen: Normal in size without focal abnormality. Adrenals/Urinary Tract: The adrenal glands are within normal limits. A nonobstructive calculus is noted on the left. No ureteral calculus or obstructive uropathy bilaterally. The bladder is unremarkable. Stomach/Bowel: Stomach is within normal limits. Appendix appears normal. No evidence of bowel wall thickening, distention, or inflammatory changes. No free air or pneumatosis. A moderate amount of retained stool is noted in the colon. Vascular/Lymphatic: Aortic atherosclerosis. No enlarged abdominal or pelvic lymph nodes. Reproductive: Prostate is unremarkable. Other: Fat containing inguinal hernias bilaterally. Small fat containing umbilical hernia. Musculoskeletal: There is asymmetric enlargement of the psoas muscle on the left with a hypodense region with a hyperdense rim measuring 8.3  x 4.2 x 3.5 cm. There is surrounding hyperdense material in the retroperitoneum on the left. Degenerative changes are noted in the lumbar spine. No acute osseous abnormality. IMPRESSION: 1. Asymmetric enlargement of the psoas muscle on the left containing a hypodense region with surrounding hyperdense material within the muscle and in the retroperitoneum on the left, possible hemorrhage versus abscess. 2. Atelectasis or infiltrate in the lower lobes bilaterally. 3. Small right pleural effusion and moderate loculated pleural effusion on the left. 4. Small hyperdense pericardial effusion, possibly reflecting blood products versus infection. 5. A few scattered pulmonary nodules bilaterally measuring up to 3 mm. No follow-up needed if patient is low-risk (and has no known or suspected primary neoplasm). Non-contrast chest CT can be considered in 12 months if patient is high-risk. This recommendation follows the consensus statement: Guidelines for Management of Incidental Pulmonary Nodules Detected on CT Images: From the Fleischner Society 2017; Radiology 2017; 284:228-243. 6. Aortic atherosclerosis. 7. Nonobstructive left renal calculus. Electronically Signed   By: Brett Fairy M.D.   On: 11/25/2021 21:55    amiodarone  400 mg Oral BID   aspirin EC  325 mg Oral Daily   Or   aspirin  324 mg Per Tube Daily   bisacodyl  10 mg Rectal Q0600   Chlorhexidine Gluconate Cloth  6 each Topical Daily   darbepoetin (ARANESP) injection - DIALYSIS  100 mcg Intravenous Q Mon-HD   docusate sodium  200 mg Oral Daily   guaiFENesin  1,200 mg Oral BID   insulin aspart  0-24 Units Subcutaneous TID WC   insulin detemir  16 Units Subcutaneous Q12H   lactulose  10 g Oral Daily   lidocaine  1 patch Transdermal Q24H   metoCLOPramide (REGLAN) injection  5 mg Intravenous Q8H   midodrine  15 mg Oral TID WC   multivitamin  1 tablet Oral QHS   pantoprazole  40 mg Oral Daily   rosuvastatin  20 mg Oral Daily   sodium chloride flush   3 mL Intravenous Q12H    BMET    Component Value Date/Time   NA 130 (L) 11/26/2021 0436   NA 139 10/21/2021 1547   K 4.1 11/26/2021 0436   CL 91 (L) 11/26/2021 0436   CO2 23 11/26/2021 0436   GLUCOSE 241 (H) 11/26/2021 0436   BUN 35 (H) 11/26/2021 0436   BUN 29 (H) 10/21/2021 1547   CREATININE 4.92 (H) 11/26/2021 0436   CALCIUM 8.2 (L) 11/26/2021 0436   GFRNONAA 12 (L) 11/26/2021 0436   GFRAA 42 (L) 10/18/2019 1006   CBC    Component Value Date/Time   WBC 25.4 (H) 11/26/2021 0436   RBC 2.56 (L) 11/26/2021 0436   HGB 7.6 (L) 11/26/2021 0436   HGB 13.4 10/21/2021 1547   HCT 23.8 (L) 11/26/2021 0436   HCT 40.8 10/21/2021 1547   PLT 287 11/26/2021 0436   PLT 246 10/21/2021 1547   MCV 93.0 11/26/2021 0436   MCV 91 10/21/2021 1547   MCH 29.7 11/26/2021 0436   MCHC 31.9 11/26/2021 0436   RDW 15.6 (H) 11/26/2021 0436   RDW 14.6 10/21/2021 1547    Assessment/Plan: Acute kidney injury on chronic kidney disease stage IIIa: -AKI likely ischemic ATN in the setting of septic versus cardiogenic shock.  Started on CRRT 5/26 for extracorporeal volume unloading after he had fixed urine output on Lasix drip that was inadequate for volume unloading. CRRT 5/26-5/31.   New temp HD catheter placed 11/21/21 and has had IHD on 6/3 and 11/25/21.  Will continue with MWF schedule. unable to receive [facility] given leukocytosis/infection.  no evidence of renal recovery at this junction, will need his HD catheter converted to [facility] down the road  Coronary artery disease status post three-vessel CABG: -on aspirin/statin.  Left hip/back pain - CT scan with left psoas muscle hematoma vs abscess.  Given leukocytosis, may need IR for aspiration and cultures. Acute on chronic dCHF -UF as tolerated with HD, volume status improving -milrinone stopped 5/29 Shock -possibly from sepsis+cardiogenic. Abx/pressor support per primary service. Sepsis w/u per CCM/  -off pressors and on midodrine now Atrial fibrillation:  -Status post MAZE procedure at the time of CABG. Per primary service, on amio. S/p tee-dccv 6/1.  S/p a successful cardioversion 11/25/21.  Anemia: -Likely associated with recent surgery and postoperative inflammatory response.  transfuse PRN for hgb <7-iron panel=iron deficient but will hold off on adding Fe given that he is receiving abx and there is no clearcut source of infection. ESA started 6/5 Hyponatremia -Managing with HD and UF, improved Left Pleural Effusion -s/p thora 6/2, 900cc removed  Donetta Potts, MD [facility]

## 2021-11-26 NOTE — Progress Notes (Signed)
[place] for IV heparin Indication: atrial fibrillation  No Known Allergies  Patient Measurements: Height: 5\' 10"  (177.8 cm) Weight: 121 kg (266 lb 12.1 oz) IBW/kg (Calculated) : 73 Heparin Dosing Weight: ~ 100 kg  Vital Signs: Temp: 97.9 F (36.6 C) (06/06 0440) Temp Source: Axillary (06/06 0440) BP: 104/49 (06/06 0600) Pulse Rate: 78 (06/06 0600)  Labs: Recent Labs    11/24/21 0455 11/24/21 1543 11/25/21 0358 11/25/21 2210 11/26/21 0436 11/26/21 0438 11/26/21 0522  HGB 8.0*  --  7.4*  --  7.6*  --   --   HCT 24.3*  --  23.1*  --  23.8*  --   --   PLT 293  --  310  --  287  --   --   LABPROT  --   --  16.7*  --   --   --   --   INR  --   --  1.4*  --   --   --   --   HEPARINUNFRC 0.40  --  0.31  --   --  0.17* 0.21*  CREATININE 3.38*   < > 6.09* 4.01* 4.92*  --   --    < > = values in this interval not displayed.     Estimated Creatinine Clearance: 19 mL/min (A) (by C-G formula based on SCr of 4.92 mg/dL (H)).   Medical History: Past Medical History:  Diagnosis Date   Atrial fibrillation ([place])    Benign essential HTN 07/30/2014   Bradycardia 01/25/2015   CHF (congestive heart failure) (HCC)    Chronic anticoagulation 16/03/9603   Chronic systolic CHF (congestive heart failure), NYHA class 3 ([place]) 08/30/2014   Overview:  Global ef 30%   CKD (chronic kidney disease)    Class 3 severe obesity in adult ([place]) 04/28/2017   Coronary artery disease    Diabetes mellitus without complication (HCC)    Diabetic neuropathy ([place]) 07/28/2014   Dysrhythmia    Erectile dysfunction    High risk medication use 05/26/2018   Hypertension    Hypertensive heart disease with heart failure ([place]) 07/30/2014   Lumbago    LV dysfunction 04/28/2017   Mixed hyperlipidemia 07/28/2014   OSA (obstructive sleep apnea)    CPAP   Paroxysmal atrial fibrillation ([place]) 07/28/2014   Sinus node dysfunction ([place]) 04/21/2016   Testicular  hypofunction    Uncontrolled type 2 diabetes mellitus with microalbuminuric diabetic nephropathy 07/28/2014   Assessment: 66 yo male on chronic Xarelto for afib.  Now s/p CABG with MAZE, and on CRRT.  Remains in afib.  Pharmacy asked to dose IV heparin. Targeting lowering heparin level goal given recent OHS.  Heparin level remains therapeutic at 0.3, H/H and pltc stable.  S/P DCCV on 6/5> SR on amiodarone drip  Convert to oral anticoagulant when procedures complete  6/6 AM update: Heparin level low  Hgb low but stable from yesterday   Goal of Therapy:  Heparin level 0.3-0.5 Monitor platelets by anticoagulation protocol: Yes   Plan:  Inc heparin to 1800 units/hr 1400 heparin level  Narda Bonds, PharmD, BCPS Clinical Pharmacist Phone: [phone]

## 2021-11-26 NOTE — Progress Notes (Signed)
Inpatient Diabetes Program Recommendations  AACE/ADA: New Consensus Statement on Inpatient Glycemic Control (2015)  Target Ranges:  Prepandial:   less than 140 mg/dL      Peak postprandial:   less than 180 mg/dL (1-2 hours)      Critically ill patients:  140 - 180 mg/dL   Lab Results  Component Value Date   GLUCAP 194 (H) 11/26/2021   HGBA1C 9.3 (H) 11/08/2021    Review of Glycemic Control  Latest Reference Range & Units 11/25/21 11:07 11/25/21 17:20 11/25/21 20:43 11/26/21 04:38 11/26/21 06:41 11/26/21 11:00  Glucose-Capillary 70 - 99 mg/dL 298 (H) 222 (H) 191 (H) 234 (H) 220 (H) 194 (H)   Diabetes history: DM 2 Outpatient Diabetes medications: Metformin 1000 mg BID, Farxiga 10 mg QD, Trulicity 3 mg Qwk, Glipizide 10 mg BID Current orders for Inpatient glycemic control:  TCTS Novolog (0-24 units) tid with meals Levemir 16 units bid  Inpatient Diabetes Program Recommendations:    Consider increasing Levemir to 18 units bid.  Also consider reducing Novolog correction to moderate tid with meals and add Novolog meal coverage 3 units tid with meals (hold if patient eats less than 50% or NPO).   Thanks,  Adah Perl, RN, BC-ADM Inpatient Diabetes Coordinator Pager [phone]  (8a-5p)

## 2021-11-26 NOTE — Progress Notes (Signed)
[place] for IV heparin Indication: atrial fibrillation  No Known Allergies  Patient Measurements: Height: 5\' 10"  (177.8 cm) Weight: 121.3 kg (267 lb 6.7 oz) IBW/kg (Calculated) : 73 Heparin Dosing Weight: ~ 100 kg  Vital Signs: Temp: 98 F (36.7 C) (06/06 1558) Temp Source: Oral (06/06 1558) BP: 121/59 (06/06 1500) Pulse Rate: 77 (06/06 1500)  Labs: Recent Labs    11/25/21 0358 11/25/21 2210 11/26/21 0436 11/26/21 0438 11/26/21 0522 11/26/21 1513  HGB 7.4*  --  7.6*  7.6*  --   --  8.5*  HCT 23.1*  --  24.0*  23.8*  --   --  26.2*  PLT 310  --  293  287  --   --  293  LABPROT 16.7*  --   --   --   --   --   INR 1.4*  --   --   --   --   --   HEPARINUNFRC 0.31  --   --  0.17* 0.21* 0.15*  CREATININE 6.09* 4.01* 4.92*  --   --   --      Estimated Creatinine Clearance: 19 mL/min (A) (by C-G formula based on SCr of 4.92 mg/dL (H)).   Medical History: Past Medical History:  Diagnosis Date   Atrial fibrillation ([place])    Benign essential HTN 07/30/2014   Bradycardia 01/25/2015   CHF (congestive heart failure) (HCC)    Chronic anticoagulation 62/69/4854   Chronic systolic CHF (congestive heart failure), NYHA class 3 ([place]) 08/30/2014   Overview:  Global ef 30%   CKD (chronic kidney disease)    Class 3 severe obesity in adult ([place]) 04/28/2017   Coronary artery disease    Diabetes mellitus without complication (HCC)    Diabetic neuropathy ([place]) 07/28/2014   Dysrhythmia    Erectile dysfunction    High risk medication use 05/26/2018   Hypertension    Hypertensive heart disease with heart failure ([place]) 07/30/2014   Lumbago    LV dysfunction 04/28/2017   Mixed hyperlipidemia 07/28/2014   OSA (obstructive sleep apnea)    CPAP   Paroxysmal atrial fibrillation ([place]) 07/28/2014   Sinus node dysfunction ([place]) 04/21/2016   Testicular hypofunction    Uncontrolled type 2 diabetes mellitus with microalbuminuric diabetic  nephropathy 07/28/2014   Assessment: 66 yo male on chronic Xarelto for afib.  Now s/p CABG with MAZE, and on CRRT.  Remains in afib.  Pharmacy asked to dose IV heparin. Targeting lowering heparin level goal given recent OHS.  Heparin level 0.15  Goal of Therapy:  Heparin level 0.3-0.5 Monitor platelets by anticoagulation protocol: Yes   Plan:  Inc heparin to 2000 units/hr Heparin off at 2 am  Thank you Anette Guarneri, PharmD

## 2021-11-26 NOTE — Progress Notes (Addendum)
Patient ID: [name] [name], male   DOB: 04/21/1956, 66 y.o.   MRN: [id number]     Advanced Heart Failure Rounding Note  PCP-Cardiologist: None   Subjective:    Abx started 5/25 for suspected septic shock.  Initial abx completed and afebrile, but WBCs up, 17>>19.8>>22.2>>25>>23>>31.5>>25 . CCM added rocephin 06/01 for possible aspiration. On 6/2, abx transitioned to vanc/cefepime and micafungin added (yeast in resp culture).   S/p TEE/DCCV to SR 06/01. 6/5 S/P DC-CV--->SR   Left thoracentesis 6/2 with 900 cc off.   Echo: Difficult study. LV EF 55-60%, RV probably normal, no pericardial effusion.   iHD 6/5   Wants to try and walk today. Complaining of back pain. Denies SOB.    Objective:   Weight Range: 121.3 kg Body mass index is 38.37 kg/m.   Vital Signs:   Temp:  [97.4 F (36.3 C)-99.1 F (37.3 C)] 98.2 F (36.8 C) (06/06 0640) Pulse Rate:  [51-95] 82 (06/06 0700) Resp:  [13-25] 18 (06/06 0700) BP: (80-139)/(33-98) 99/59 (06/06 0700) SpO2:  [79 %-100 %] 93 % (06/06 0700) Weight:  [121 kg-124 kg] 121.3 kg (06/06 0640) Last BM Date : 11/25/21  Weight change: Filed Weights   11/25/21 1556 11/25/21 1938 11/26/21 0640  Weight: 124 kg 121 kg 121.3 kg    Intake/Output:   Intake/Output Summary (Last 24 hours) at 11/26/2021 0722 Last data filed at 11/26/2021 0700 Gross per 24 hour  Intake 1971.53 ml  Output 3000 ml  Net -1028.47 ml     CVP 4-5 checked in the chair.  Physical Exam  General: Sitting in the chair.  No resp difficulty.  HEENT: normal Neck: supple. no JVD. Carotids 2+ bilat; no bruits. No lymphadenopathy or thryomegaly appreciated. Cor: PMI nondisplaced. Regular rate & rhythm. No rubs, gallops or murmurs. R and L CVC Lungs: clear Abdomen: soft, nontender, nondistended. No hepatosplenomegaly. No bruits or masses. Good bowel sounds. Extremities: no cyanosis, clubbing, rash, edema.  Neuro: alert & orientedx3, cranial nerves grossly intact. moves all 4  extremities w/o difficulty. Affect pleasant  Telemetry   SR 80s personally checked.   Labs    CBC Recent Labs    11/25/21 0358 11/26/21 0436  WBC 31.5* 25.4*  HGB 7.4* 7.6*  HCT 23.1* 23.8*  MCV 93.1 93.0  PLT 310 546   Basic Metabolic Panel Recent Labs    11/25/21 0358 11/25/21 2210 11/26/21 0436  NA 130* 127* 130*  K 4.6 3.6 4.1  CL 94* 88* 91*  CO2 21* 22 23  GLUCOSE 187* 201* 241*  BUN 52* 31* 35*  CREATININE 6.09* 4.01* 4.92*  CALCIUM 8.5* 7.9* 8.2*  MG 2.0  --  1.8  PHOS 6.0* 3.9 5.2*   Liver Function Tests Recent Labs    11/25/21 2210 11/26/21 0436  ALBUMIN 2.8* 2.5*   No results for input(s): LIPASE, AMYLASE in the last 72 hours. Cardiac Enzymes No results for input(s): CKTOTAL, CKMB, CKMBINDEX, TROPONINI in the last 72 hours.  BNP: BNP (last 3 results) No results for input(s): BNP in the last 8760 hours.  ProBNP (last 3 results) No results for input(s): PROBNP in the last 8760 hours.   D-Dimer No results for input(s): DDIMER in the last 72 hours. Hemoglobin A1C No results for input(s): HGBA1C in the last 72 hours. Fasting Lipid Panel No results for input(s): CHOL, HDL, LDLCALC, TRIG, CHOLHDL, LDLDIRECT in the last 72 hours. Thyroid Function Tests No results for input(s): TSH, T4TOTAL, T3FREE, THYROIDAB in the last 72 hours.  Invalid input(s): FREET3  Other results:   Imaging    CT CHEST ABDOMEN PELVIS WO CONTRAST  Result Date: 11/25/2021 CLINICAL DATA:  Sepsis, elevated WBC. EXAM: CT CHEST, ABDOMEN AND PELVIS WITHOUT CONTRAST TECHNIQUE: Multidetector CT imaging of the chest, abdomen and pelvis was performed following the standard protocol without IV contrast. RADIATION DOSE REDUCTION: This exam was performed according to the departmental dose-optimization program which includes automated exposure control, adjustment of the mA and/or kV according to patient size and/or use of iterative reconstruction technique. COMPARISON:  None  Available. FINDINGS: CT CHEST FINDINGS Cardiovascular: Heart is normal in size and there is a hyperdense pericardial effusion measuring up to 1.3 cm in thickness. Three-vessel coronary artery calcifications are noted. There is atherosclerotic calcification of the aorta without evidence of aneurysm. Pulmonary trunk is mildly distended which may be associated with underlying pulmonary artery hypertension. The distal tip of a central venous catheter terminates in the superior vena cava. Mediastinum/Nodes: No mediastinal or axillary lymphadenopathy by size criteria. Evaluation of the hila is limited due to lack of IV contrast. The thyroid gland and esophagus are within normal limits. A small amount of debris is noted in the trachea. Lungs/Pleura: Mild atelectasis or infiltrate is noted in the lower lobes bilaterally. There is a small pleural effusion on the right and a moderate loculated pleural effusion on the left. No pneumothorax. A few scattered pulmonary nodules are noted on the right measuring up to 3 mm, axial image 65. Musculoskeletal: Sternotomy wires are noted in the midline. No acute or suspicious osseous abnormality. CT ABDOMEN PELVIS FINDINGS Hepatobiliary: Subcentimeter hypodensity is present in the anterior right lobe of the liver which is too small to further characterize. The gallbladder is without stones. No biliary ductal dilatation. Pancreas: Unremarkable. No pancreatic ductal dilatation or surrounding inflammatory changes. Spleen: Normal in size without focal abnormality. Adrenals/Urinary Tract: The adrenal glands are within normal limits. A nonobstructive calculus is noted on the left. No ureteral calculus or obstructive uropathy bilaterally. The bladder is unremarkable. Stomach/Bowel: Stomach is within normal limits. Appendix appears normal. No evidence of bowel wall thickening, distention, or inflammatory changes. No free air or pneumatosis. A moderate amount of retained stool is noted in the  colon. Vascular/Lymphatic: Aortic atherosclerosis. No enlarged abdominal or pelvic lymph nodes. Reproductive: Prostate is unremarkable. Other: Fat containing inguinal hernias bilaterally. Small fat containing umbilical hernia. Musculoskeletal: There is asymmetric enlargement of the psoas muscle on the left with a hypodense region with a hyperdense rim measuring 8.3 x 4.2 x 3.5 cm. There is surrounding hyperdense material in the retroperitoneum on the left. Degenerative changes are noted in the lumbar spine. No acute osseous abnormality. IMPRESSION: 1. Asymmetric enlargement of the psoas muscle on the left containing a hypodense region with surrounding hyperdense material within the muscle and in the retroperitoneum on the left, possible hemorrhage versus abscess. 2. Atelectasis or infiltrate in the lower lobes bilaterally. 3. Small right pleural effusion and moderate loculated pleural effusion on the left. 4. Small hyperdense pericardial effusion, possibly reflecting blood products versus infection. 5. A few scattered pulmonary nodules bilaterally measuring up to 3 mm. No follow-up needed if patient is low-risk (and has no known or suspected primary neoplasm). Non-contrast chest CT can be considered in 12 months if patient is high-risk. This recommendation follows the consensus statement: Guidelines for Management of Incidental Pulmonary Nodules Detected on CT Images: From the Fleischner Society 2017; Radiology 2017; 284:228-243. 6. Aortic atherosclerosis. 7. Nonobstructive left renal calculus. Electronically Signed   By:  Brett Fairy M.D.   On: 11/25/2021 21:55     Medications:     Scheduled Medications:  aspirin EC  325 mg Oral Daily   Or   aspirin  324 mg Per Tube Daily   bisacodyl  10 mg Rectal Q0600   Chlorhexidine Gluconate Cloth  6 each Topical Daily   darbepoetin (ARANESP) injection - DIALYSIS  100 mcg Intravenous Q Mon-HD   docusate sodium  200 mg Oral Daily   guaiFENesin  1,200 mg Oral BID    insulin aspart  0-24 Units Subcutaneous TID WC   insulin detemir  16 Units Subcutaneous Q12H   lactulose  10 g Oral Daily   lidocaine  1 patch Transdermal Q24H   metoCLOPramide (REGLAN) injection  5 mg Intravenous Q8H   midodrine  15 mg Oral TID WC   multivitamin  1 tablet Oral QHS   pantoprazole  40 mg Oral Daily   rosuvastatin  20 mg Oral Daily   sodium chloride flush  3 mL Intravenous Q12H    Infusions:  sodium chloride     amiodarone 30 mg/hr (11/26/21 0700)   anticoagulant sodium citrate     ceFEPime (MAXIPIME) IV Stopped (11/25/21 1008)   heparin 1,800 Units/hr (11/26/21 0700)   micafungin (MYCAMINE) IV Stopped (11/25/21 1241)   vancomycin 1,000 mg (11/25/21 1840)    PRN Medications: acetaminophen, alteplase, anticoagulant sodium citrate, heparin, HYDROmorphone (DILAUDID) injection, ipratropium-albuterol, lidocaine (PF), lidocaine-prilocaine, ondansetron (ZOFRAN) IV, oxyCODONE, pentafluoroprop-tetrafluoroeth, sodium chloride flush, traMADol    Patient Profile   66 y.o. male with history of CM with recovered EF, persistent atrial fibrillation, DM II, CKD, sick sinus syndrome, OSA, prior smoker. Underwent CABG X 3 and MAZE/PVI on 05/23>>post-op decrease in UOP and AKI with volume overload>>postcardiotomy shock requiring inotrope and pressor support  Assessment/Plan   1. Shock: Echo reviewed, LV EF 55-60%, RV poorly visualized but probably near-normal, no significant pericardial effusion. Suspect sepsis physiology with distributive shock.  Improved with broad spectrum abx. Lactate has cleared. Bcx 5/25 remain NGTD. Now off pressors/resolved.   - Continue midodrine to 15 mg TID 2. Acute on chronic primarily diastolic CHF: LV EF 54-00%, RV poorly visualized but probably near-normal, no significant pericardial effusion.  Patient is still volume overloaded, suspect primarily right heart failure physiology.  CVVHD started on 5/26. Now getting iHD and tolerating.  3. AKI on CKD  stage 3: Baseline SCr 1.3-1.5 .  Suspect due to shock/ATN as well as elevated renal venous pressure in setting of volume overload, ileus, abdominal distention. CVVHD started 5/26. Remains nearly anuric.  Lost HD line 5/31, replaced 06/01. Now getting iHD. Minimal urine output.  Creatinine 4.9. BUN 35.  4. CAD: S/p CABG. No chest pain.  - Continue ASA, statin.  5. Atrial fibrillation with RVR: Has had Maze and LA appendage clip at time of CABG on 11/12/21. S/p TEE/DCCV to SR on 06//01. Back in AF by 6/2.  S/P DC-CV --> NSR.  - Continue heparin gtt until all procedures completed. Plan for eventual eliquis.  - Stop amio drip. Start amio 400 mg po twice a day.  6. DM2: SSI.  7. ID: WBCs 22>>16K > 14K -> 12.8 -> 16 -> 17 -> 19.8 -> 22 -> 25 -> 23->31.5->25.4.   Septic shock initially, resolved.  Blood cultures NGTD.  Treated for HCAP, completed vanc and cefepime.  With WBCs rising again and concern for aspiration, he is back on vanc/cefepime and micafungin given yeast in sputum.  - Continue vanc/cefepime/micafungin per  pharmacy.  - May need to involve ID.  8. Ileus: Improved.  9. Pulmonary: Respiratory status improving with fluid removal and abx. Thoracentesis on 6/2.  - HD for volume removal  - CCM following.    Mobilize.   Length of Stay: [place], NP  11/26/2021, 7:22 AM  Advanced Heart Failure Team Pager [phone] (M-F; 7a - 5p)  Please contact [facility] for night-coverage after hours (5p -7a ) and weekends on [email or web address]  Patient seen with NP, agree with the above note.   He was cardioverted yesterday, remains in NSR this morning on IV amiodarone. CVP 4-5 today. WBCs lower, afebrile.   CT chest/abd/pelvis with enlargement left psoas muscle (?hemorrhage vs abscess), bilateral lower lobe infiltrates vs atelectasis, moderate loculated left pleural effusion.   Feels better.    General: NAD Neck: No JVD, no thyromegaly or thyroid nodule.  Lungs: Decreased BS at bases.  CV:  Nondisplaced PMI.  Heart regular S1/S2, no S3/S4, no murmur.  No peripheral edema.   Abdomen: Soft, nontender, no hepatosplenomegaly, no distention.  Skin: Intact without lesions or rashes.  Neurologic: Alert and oriented x 3.  Psych: Normal affect. Extremities: No clubbing or cyanosis.  HEENT: Normal.   Agree with transition to po amiodarone.  Continue heparin gtt until all renal access procedures are completed, then start apixaban.   Remains on broad spectrum abx + micafungin.  WBCs coming down.  See CT report above, possible infectious sources include lungs and ?abscess left psoas.  TCTS to consult ID.   Loralie Champagne 11/26/2021 7:55 AM

## 2021-11-26 NOTE — Consult Note (Signed)
[facility]    Date of Admission:  11/12/2021     Total days of antibiotics 13               Reason for Consult: Leukocytosis   Referring Provider: Dr. Lawson Fiscal Primary Care Provider: Cyndi Bender, PA-C   ASSESSMENT:  [name] [name] is a 66 y/o male with CAD and atrial fibrillation admitted for CABGx3 and Maze procedure with course that has been complicated by acute kidney injury requiring CRRT and now HD and continued leukocytosis of unclear origin. There is no clear evidence of infection. Blood and pleural fluid cultures have been without growth to date. Has been treated with broad spectrum antibiotics for approximately 13 days with no significant improvements. Suspect candida on respiratory culture is colonization as opposed to infection. Will stop micafungin. CT findings concerning for possible hematoma or abscess which is/was likely contributing to his hip/back pain and may also be causing a reactive WBC elevation. Not sure this needs to be explored further.  Incision site is without infection. Will change antibiotics to Unasyn for now. Limit/remove external lines as able. Remaining medical and supportive care per primary team.   PLAN:  Change antibiotics to Unasyn.  Limit lines/drains as able.  Remaining medical and supportive care per primary team.   Principal Problem:   S/P CABG x 3    amiodarone  400 mg Oral BID   aspirin EC  325 mg Oral Daily   Or   aspirin  324 mg Per Tube Daily   bisacodyl  10 mg Rectal Q0600   Chlorhexidine Gluconate Cloth  6 each Topical Daily   darbepoetin (ARANESP) injection - DIALYSIS  100 mcg Intravenous Q Mon-HD   docusate sodium  200 mg Oral Daily   guaiFENesin  1,200 mg Oral BID   insulin aspart  0-24 Units Subcutaneous TID WC   insulin detemir  16 Units Subcutaneous Q12H   lactulose  10 g Oral Daily   lidocaine  1 patch Transdermal Q24H   metoCLOPramide (REGLAN) injection  5 mg Intravenous Q8H   midodrine   15 mg Oral TID WC   multivitamin  1 tablet Oral QHS   pantoprazole  40 mg Oral Daily   rosuvastatin  20 mg Oral Daily   sodium chloride flush  3 mL Intravenous Q12H     HPI: [name] [name] is a 66 y.o. male with previous medical history as detailed below and significant atrial fibrillation, diabetes, hypertension, and CAD admitted for multivessel CABG with Maze procedure.  [name] [name] had atrial fibrillation with 2 unsuccessful cardioversions. During catheterization was noted to have 3 vessel coronary disease and underent CABG x 3 with Maze procedure on 11/12/21. Noted to have elevated WBC count of 18 on 5/24 believed to be reactive. Chest x-ray with atelectasis and no pneumothroax. Procalcitonin sent and 0.70. Pan cultured and started on Cefepime and Vancomycin for concern for infection in setting of leukocytosis and elevated lactic acid and up trending procalicitonin. Found to have mild ileus which was decompressed with NG tube. Renal function with initial acute AKI. Central line placed on 5/26. Concern remained for septic shock with an element of cardiogenic shock and was started on CRRT. Respiratory failure related to ileus and volume overload. Vancomycin stopped on 11/17/21 and blood cultures remained without growth. On 11/18/21 there was no evidence of renal recovery with CRRT. Chest x-ray with concern for developing pneumonia on 5/29. New triple lumen catheter placed on 11/19/21. WBC  count initially dropping then increased again. Respiratory culture with Candida albicans on 5/31. Completed course of antibiotics for suspected pneumonia. Restarted on Ceftriaxone on 6/1 to complete 5 days of treatment. New non-tunneled temporary dialysis catheter placed on 6/1. WBC count up to 22 and antibiotics broadened and antifungal coverage added with micafungin. Left pleural effusion s/p thoracentesis on 6/2 with 900 cc's of serosanguinous fluid drained. Had increasing left sided back pain radiating down leg on 6/3. CT  chest/abd/pelvis with enlargement of the psoas muscle with hypodense region with possible hemorrhage or abscess; small right pleural effusion and moderate loculated left pleural effusion; small hyperdense pericardial effusion reflecting blood products versus infection, and few scattered pulmonary nodules.   [name] [name] has remained afebrile since admission with up trending WBC count as high as 31.5 on 6/4 and decreased today to 25.4. He has completed 13 days of antibiotics at this point with cefepime, vancomycin, ceftriaxone and micafungin. Lines replaced as above. Back/hip pain is improved today. Currently with CVC triple lumen right subclavian and HD catheter left subclavian. Blood cultures from 11/14/21 were without growth to date and pleural fluid culture without growth. ID has been consulted for continued leukocytosis.    Review of Systems: Review of Systems  Constitutional:  Negative for chills, fever and weight loss.  Respiratory:  Negative for cough, shortness of breath and wheezing.   Cardiovascular:  Negative for chest pain and leg swelling.  Gastrointestinal:  Negative for abdominal pain, constipation, diarrhea, nausea and vomiting.  Skin:  Negative for rash.    Past Medical History:  Diagnosis Date   Atrial fibrillation ([place])    Benign essential HTN 07/30/2014   Bradycardia 01/25/2015   CHF (congestive heart failure) (HCC)    Chronic anticoagulation 58/52/7782   Chronic systolic CHF (congestive heart failure), NYHA class 3 ([place]) 08/30/2014   Overview:  Global ef 30%   CKD (chronic kidney disease)    Class 3 severe obesity in adult [facility]) 04/28/2017   Coronary artery disease    Diabetes mellitus without complication (HCC)    Diabetic neuropathy ([place]) 07/28/2014   Dysrhythmia    Erectile dysfunction    High risk medication use 05/26/2018   Hypertension    Hypertensive heart disease with heart failure ([place]) 07/30/2014   Lumbago    LV dysfunction 04/28/2017   Mixed hyperlipidemia  07/28/2014   OSA (obstructive sleep apnea)    CPAP   Paroxysmal atrial fibrillation ([place]) 07/28/2014   Sinus node dysfunction ([place]) 04/21/2016   Testicular hypofunction    Uncontrolled type 2 diabetes mellitus with microalbuminuric diabetic nephropathy 07/28/2014    Social History   Tobacco Use   Smoking status: Former    Types: Cigarettes    Quit date: 2007    Years since quitting: 16.4   Smokeless tobacco: Never  Vaping Use   Vaping Use: Never used  Substance Use Topics   Alcohol use: No   Drug use: Yes    Frequency: 1.0 times per week    Types: Marijuana    Family History  Problem Relation Age of Onset   CAD Brother    Drug abuse Brother    Lung cancer Father    CAD Mother     No Known Allergies  OBJECTIVE: Blood pressure (!) 121/51, pulse 80, temperature 98.5 F (36.9 C), temperature source Oral, resp. rate 14, height 5\' 10"  (1.778 m), weight 121.3 kg, SpO2 92 %.  Physical Exam Constitutional:      General: He is not in acute  distress.    Appearance: He is well-developed. He is obese.     Comments: Lying in bed with head of bed elevated; pleasant.   Cardiovascular:     Rate and Rhythm: Normal rate and regular rhythm.     Heart sounds: Normal heart sounds.     Comments: Central line in right subclavian; left subclavian with temporary dialysis catheter. Pulmonary:     Effort: Pulmonary effort is normal.     Breath sounds: Normal breath sounds.  Musculoskeletal:     Comments: Area of bruising with possible hematoma medial right distal femur  Skin:    General: Skin is warm and dry.  Neurological:     Mental Status: He is alert and oriented to person, place, and time.  Psychiatric:        Behavior: Behavior normal.        Thought Content: Thought content normal.        Judgment: Judgment normal.    Lab Results Lab Results  Component Value Date   WBC 25.4 (H) 11/26/2021   WBC 25.2 (H) 11/26/2021   HGB 7.6 (L) 11/26/2021   HGB 7.6 (L) 11/26/2021    HCT 23.8 (L) 11/26/2021   HCT 24.0 (L) 11/26/2021   MCV 93.0 11/26/2021   MCV 94.9 11/26/2021   PLT 287 11/26/2021   PLT 293 11/26/2021    Lab Results  Component Value Date   CREATININE 4.92 (H) 11/26/2021   BUN 35 (H) 11/26/2021   NA 130 (L) 11/26/2021   K 4.1 11/26/2021   CL 91 (L) 11/26/2021   CO2 23 11/26/2021    Lab Results  Component Value Date   ALT 9 11/18/2021   AST 26 11/18/2021   ALKPHOS 51 11/18/2021   BILITOT 0.8 11/18/2021     Microbiology: Recent Results (from the past 240 hour(s))  Expectorated Sputum Assessment w Gram Stain, Rflx to Resp Cult     Status: None   Collection Time: 11/20/21 11:10 AM   Specimen: Expectorated Sputum  Result Value Ref Range Status   Specimen Description EXPECTORATED SPUTUM  Final   Special Requests NONE  Final   Sputum evaluation   Final    THIS SPECIMEN IS ACCEPTABLE FOR SPUTUM CULTURE Performed at [facility], [address], [place], [place] [address]    Report Status 11/20/2021 FINAL  Final  Culture, Respiratory w Gram Stain     Status: None   Collection Time: 11/20/21 11:10 AM  Result Value Ref Range Status   Specimen Description EXPECTORATED SPUTUM  Final   Special Requests NONE Reflexed from [id number]  Final   Gram Stain   Final    ABUNDANT WBC PRESENT, PREDOMINANTLY PMN FEW YEAST WITH PSEUDOHYPHAE BUDDING YEAST SEEN RARE GRAM POSITIVE RODS Performed at [facility], [place] [address] [place], [place] [address]    Culture FEW CANDIDA ALBICANS  Final   Report Status 11/23/2021 FINAL  Final  Body fluid culture w Gram Stain     Status: None   Collection Time: 11/22/21  1:25 PM   Specimen: Pleural Fluid  Result Value Ref Range Status   Specimen Description PLEURAL  Final   Special Requests NONE  Final   Gram Stain   Final    WBC PRESENT, PREDOMINANTLY PMN NO ORGANISMS SEEN CYTOSPIN SMEAR    Culture   Final    NO GROWTH 3 DAYS Performed at [facility], [place] [address], [place], [place]  [address]  Report Status 11/25/2021 FINAL  Final  Urine Culture     Status: None   Collection Time: 11/23/21  5:45 AM   Specimen: Urine, Clean Catch  Result Value Ref Range Status   Specimen Description URINE, CLEAN CATCH  Final   Special Requests NONE  Final   Culture   Final    NO GROWTH Performed at [facility], [address], [place], [place] [address]    Report Status 11/24/2021 FINAL  Final     Terri Piedra, NP [facility]  11/26/2021  11:05 AM

## 2021-11-26 NOTE — Progress Notes (Signed)
1 Day Post-Op Procedure(s) (LRB): CARDIOVERSION (N/A) Subjective: L flank pain intermittent Appreciate IR evaluation and recommendations IR consult placed to drain, culture L psoas abscess/ collection- iv          iv heparin to stop for procedure No HD today- making small amount urine Maintaining nsr Objective: Vital signs in last 24 hours: Temp:  [97.9 F (36.6 C)-99.1 F (37.3 C)] 98.6 F (37 C) (06/06 1330) Pulse Rate:  [71-95] 82 (06/06 1330) Cardiac Rhythm: Normal sinus rhythm (06/06 0800) Resp:  [13-25] 21 (06/06 1330) BP: (80-136)/(33-93) 113/55 (06/06 1330) SpO2:  [89 %-97 %] 93 % (06/06 1330) Weight:  [121 kg-124 kg] 121.3 kg (06/06 0640)  Hemodynamic parameters for last 24 hours: CVP:  [9 mmHg-12 mmHg] 12 mmHg  Intake/Output from previous day: 06/05 0701 - 06/06 0700 In: 1971.5 [P.O.:720; I.V.:600.7; Blood:436; IV Piggyback:214.9] Out: 3000  Intake/Output this shift: Total I/O In: 620.9 [P.O.:60; I.V.:146.1; Blood:315; IV Piggyback:99.9] Out: -   EXAM Nsr Lungs clear Abd non-tender  Lab Results: Recent Labs    11/25/21 0358 11/26/21 0436  WBC 31.5* 25.2*  25.4*  HGB 7.4* 7.6*  7.6*  HCT 23.1* 24.0*  23.8*  PLT 310 293  287   BMET:  Recent Labs    11/25/21 2210 11/26/21 0436  NA 127* 130*  K 3.6 4.1  CL 88* 91*  CO2 22 23  GLUCOSE 201* 241*  BUN 31* 35*  CREATININE 4.01* 4.92*  CALCIUM 7.9* 8.2*    PT/INR:  Recent Labs    11/25/21 0358  LABPROT 16.7*  INR 1.4*   ABG    Component Value Date/Time   PHART 7.332 (L) 11/15/2021 0410   HCO3 24.2 11/16/2021 0414   TCO2 26 11/16/2021 0414   ACIDBASEDEF 3.0 (H) 11/16/2021 0414   O2SAT 56.1 11/21/2021 0612   CBG (last 3)  Recent Labs    11/26/21 0438 11/26/21 0641 11/26/21 1100  GLUCAP 234* 220* 194*    Assessment/Plan: S/P Procedure(s) (LRB): CARDIOVERSION (N/A) Transfuse 1 unit for anemia assoc with renal failure IR drain of L psoas collection- procedure d/w  patient Antibiotics per ID  LOS: 14 days    [name] [name] 11/26/2021

## 2021-11-27 ENCOUNTER — Inpatient Hospital Stay (HOSPITAL_COMMUNITY): Payer: Medicare HMO

## 2021-11-27 DIAGNOSIS — D72829 Elevated white blood cell count, unspecified: Secondary | ICD-10-CM | POA: Diagnosis not present

## 2021-11-27 DIAGNOSIS — Z951 Presence of aortocoronary bypass graft: Secondary | ICD-10-CM | POA: Diagnosis not present

## 2021-11-27 DIAGNOSIS — N179 Acute kidney failure, unspecified: Secondary | ICD-10-CM | POA: Diagnosis not present

## 2021-11-27 LAB — TYPE AND SCREEN
ABO/RH(D): A POS
Antibody Screen: NEGATIVE
Unit division: 0
Unit division: 0

## 2021-11-27 LAB — CBC
HCT: 27.7 % — ABNORMAL LOW (ref 39.0–52.0)
Hemoglobin: 8.7 g/dL — ABNORMAL LOW (ref 13.0–17.0)
MCH: 29.6 pg (ref 26.0–34.0)
MCHC: 31.4 g/dL (ref 30.0–36.0)
MCV: 94.2 fL (ref 80.0–100.0)
Platelets: 309 10*3/uL (ref 150–400)
RBC: 2.94 MIL/uL — ABNORMAL LOW (ref 4.22–5.81)
RDW: 15.9 % — ABNORMAL HIGH (ref 11.5–15.5)
WBC: 24.5 10*3/uL — ABNORMAL HIGH (ref 4.0–10.5)
nRBC: 0.1 % (ref 0.0–0.2)

## 2021-11-27 LAB — BPAM RBC
Blood Product Expiration Date: 202306282359
Blood Product Expiration Date: 202306292359
ISSUE DATE / TIME: 202306051112
ISSUE DATE / TIME: 202306060957
Unit Type and Rh: 6200
Unit Type and Rh: 6200

## 2021-11-27 LAB — RENAL FUNCTION PANEL
Albumin: 2.6 g/dL — ABNORMAL LOW (ref 3.5–5.0)
Anion gap: 18 — ABNORMAL HIGH (ref 5–15)
BUN: 52 mg/dL — ABNORMAL HIGH (ref 8–23)
CO2: 20 mmol/L — ABNORMAL LOW (ref 22–32)
Calcium: 8.4 mg/dL — ABNORMAL LOW (ref 8.9–10.3)
Chloride: 93 mmol/L — ABNORMAL LOW (ref 98–111)
Creatinine, Ser: 6.66 mg/dL — ABNORMAL HIGH (ref 0.61–1.24)
GFR, Estimated: 8 mL/min — ABNORMAL LOW (ref >60)
Glucose, Bld: 243 mg/dL — ABNORMAL HIGH (ref 70–99)
Phosphorus: 7.8 mg/dL — ABNORMAL HIGH (ref 2.5–4.6)
Potassium: 4.3 mmol/L (ref 3.5–5.1)
Sodium: 131 mmol/L — ABNORMAL LOW (ref 135–145)

## 2021-11-27 LAB — GLUCOSE, CAPILLARY
Glucose-Capillary: 223 mg/dL — ABNORMAL HIGH (ref 70–99)
Glucose-Capillary: 176 mg/dL — ABNORMAL HIGH (ref 70–99)
Glucose-Capillary: 198 mg/dL — ABNORMAL HIGH (ref 70–99)
Glucose-Capillary: 150 mg/dL — ABNORMAL HIGH (ref 70–99)

## 2021-11-27 LAB — MAGNESIUM: Magnesium: 2.1 mg/dL (ref 1.7–2.4)

## 2021-11-27 LAB — PROTIME-INR
INR: 1.3 — ABNORMAL HIGH (ref 0.8–1.2)
Prothrombin Time: 15.9 seconds — ABNORMAL HIGH (ref 11.4–15.2)

## 2021-11-27 LAB — HEPARIN LEVEL (UNFRACTIONATED): Heparin Unfractionated: 0.15 IU/mL — ABNORMAL LOW (ref 0.30–0.70)

## 2021-11-27 MED ORDER — FENTANYL CITRATE (PF) 100 MCG/2ML IJ SOLN
INTRAMUSCULAR | Status: AC | PRN
  Administered 2021-11-27: 50 ug via INTRAVENOUS

## 2021-11-27 MED ORDER — HEPARIN (PORCINE) 25000 UT/250ML-% IV SOLN
1900.0000 [IU]/h | INTRAVENOUS | Status: DC
  Administered 2021-11-27: 1700 [IU]/h via INTRAVENOUS
  Administered 2021-11-28: 1800 [IU]/h via INTRAVENOUS
  Filled 2021-11-27 (×2): qty 250

## 2021-11-27 MED ORDER — MIDAZOLAM HCL 2 MG/2ML IJ SOLN
INTRAMUSCULAR | Status: AC
  Filled 2021-11-27: qty 2

## 2021-11-27 MED ORDER — MIDAZOLAM HCL 2 MG/2ML IJ SOLN
INTRAMUSCULAR | Status: AC | PRN
  Administered 2021-11-27: 1 mg via INTRAVENOUS

## 2021-11-27 MED ORDER — AMIODARONE IV BOLUS ONLY 150 MG/100ML
150.0000 mg | Freq: Once | INTRAVENOUS | Status: AC
  Administered 2021-11-27: 150 mg via INTRAVENOUS
  Filled 2021-11-27: qty 100

## 2021-11-27 MED ORDER — FENTANYL CITRATE (PF) 100 MCG/2ML IJ SOLN
INTRAMUSCULAR | Status: AC
  Filled 2021-11-27: qty 2

## 2021-11-27 MED ORDER — LIDOCAINE-EPINEPHRINE 1 %-1:100000 IJ SOLN
INTRAMUSCULAR | Status: AC
  Filled 2021-11-27: qty 1

## 2021-11-27 MED ORDER — INSULIN DETEMIR 100 UNIT/ML ~~LOC~~ SOLN
22.0000 [IU] | Freq: Two times a day (BID) | SUBCUTANEOUS | Status: DC
  Administered 2021-11-27 (×2): 22 [IU] via SUBCUTANEOUS
  Filled 2021-11-27 (×4): qty 0.22

## 2021-11-27 NOTE — Plan of Care (Addendum)
Stable overnight, slept well on CPAP, VSS, BP better tonight.   Pain much better controlled, (lido patch, oxy 10 mg at bedtime and oxy 5 mg mid shift). Moving better, ambulating in room, participating in care but still resistant to walking in hallways.   Remains in NSR, on amio po. H/H stable 8.7/27.7  NPO for tap/drain of psoas fluid collection in IR today. Heparin off at 0200 per orders.   Anticipate iHD today. Voided small amount in toilet last night.   Problem: Education: Goal: Will demonstrate proper wound care and an understanding of methods to prevent future damage Outcome: Progressing Goal: Knowledge of disease or condition will improve Outcome: Progressing   Problem: Activity: Goal: Risk for activity intolerance will decrease Outcome: Progressing   Problem: Cardiac: Goal: Will achieve and/or maintain hemodynamic stability Outcome: Progressing   Problem: Respiratory: Goal: Respiratory status will improve Outcome: Progressing   Problem: Skin Integrity: Goal: Wound healing without signs and symptoms of infection Outcome: Progressing Goal: Risk for impaired skin integrity will decrease Outcome: Progressing   Problem: Education: Goal: Knowledge of General Education information will improve Description: Including pain rating scale, medication(s)/side effects and non-pharmacologic comfort measures Outcome: Progressing   Problem: Nutrition: Goal: Adequate nutrition will be maintained Outcome: Progressing   Problem: Coping: Goal: Level of anxiety will decrease Outcome: Progressing   Problem: Elimination: Goal: Will not experience complications related to bowel motility Outcome: Progressing

## 2021-11-27 NOTE — Progress Notes (Signed)
[facility] for IV heparin Indication: atrial fibrillation  No Known Allergies  Patient Measurements: Height: 5\' 10"  (177.8 cm) Weight: 122.8 kg (270 lb 11.6 oz) IBW/kg (Calculated) : 73 Heparin Dosing Weight: ~ 100 kg  Vital Signs: Temp: 97.6 F (36.4 C) (06/07 1142) Temp Source: Oral (06/07 1142) BP: 121/60 (06/07 1133) Pulse Rate: 74 (06/07 1133)  Labs: Recent Labs    11/25/21 0358 11/25/21 2210 11/26/21 0436 11/26/21 0438 11/26/21 0522 11/26/21 1513 11/27/21 0145  HGB 7.4*  --  7.6*  7.6*  --   --  8.5* 8.7*  HCT 23.1*  --  24.0*  23.8*  --   --  26.2* 27.7*  PLT 310  --  293  287  --   --  293 309  LABPROT 16.7*  --   --   --   --   --  15.9*  INR 1.4*  --   --   --   --   --  1.3*  HEPARINUNFRC 0.31  --   --    < > 0.21* 0.15* 0.15*  CREATININE 6.09* 4.01* 4.92*  --   --   --  6.66*   < > = values in this interval not displayed.     Estimated Creatinine Clearance: 14.1 mL/min (A) (by C-G formula based on SCr of 6.66 mg/dL (H)).   Medical History: Past Medical History:  Diagnosis Date   Atrial fibrillation ([place])    Benign essential HTN 07/30/2014   Bradycardia 01/25/2015   CHF (congestive heart failure) (HCC)    Chronic anticoagulation 67/61/9509   Chronic systolic CHF (congestive heart failure), NYHA class 3 ([place]) 08/30/2014   Overview:  Global ef 30%   CKD (chronic kidney disease)    Class 3 severe obesity in adult ([place]) 04/28/2017   Coronary artery disease    Diabetes mellitus without complication (HCC)    Diabetic neuropathy ([place]) 07/28/2014   Dysrhythmia    Erectile dysfunction    High risk medication use 05/26/2018   Hypertension    Hypertensive heart disease with heart failure ([place]) 07/30/2014   Lumbago    LV dysfunction 04/28/2017   Mixed hyperlipidemia 07/28/2014   OSA (obstructive sleep apnea)    CPAP   Paroxysmal atrial fibrillation ([place]) 07/28/2014   Sinus node dysfunction ([place]) 04/21/2016    Testicular hypofunction    Uncontrolled type 2 diabetes mellitus with microalbuminuric diabetic nephropathy 07/28/2014   Assessment: 66 yo male on chronic Xarelto for afib.  Now s/p CABG with MAZE, post op AKI CRRT>iHD.   Pharmacy asked to dose IV heparin. Targeting lowering heparin level goal given recent OHS.  Heparin drip placed on hold this am for IR procedure.  Ok to resume 6hr post  S/P DCCV on 6/5> SR on amiodarone drip > po Convert to oral anticoagulant when procedures complete H/h stable post PRBC x2, pltc stable  IR drained 3ml hematoma this morning    Goal of Therapy:  Heparin level 0.3-0.5 Monitor platelets by anticoagulation protocol: Yes   Plan:  Resume heparin drip 1700 units/hr at 6pm  Daily heparin level and cbc    [name] Pharm.D. CPP, BCPS Clinical Pharmacist [phone] 11/27/2021 2:19 PM

## 2021-11-27 NOTE — Progress Notes (Signed)
[facility] for Infectious Disease  Date of Admission:  11/12/2021     Total days of antibiotics 14         ASSESSMENT:  [name] [name] is s/p aspiration of left sided retroperitoneal hematoma without drain placement as it was favored to be sterile. No thoracentesis attempted secondary to small/trace amount of fluid. Back pain is better today and WBC count is stable. No clear evidence of pneumonia. Will continue current dose of Unasyn. Continue to monitor fever curve and WBC count. Tolerating antibiotics with no problems. Remaining medical and supportive care per primary team.   PLAN:  Continue Unasyn.  Aspiration gram stain/culture from retroperitoneal hematoma pending Monitor fever curve and WBC count.  Remaining medical and supportive care per primary team.   Principal Problem:   S/P CABG x 3 Active Problems:   Leukocytosis    amiodarone  400 mg Oral BID   aspirin EC  325 mg Oral Daily   Or   aspirin  324 mg Per Tube Daily   bisacodyl  10 mg Rectal Q0600   Chlorhexidine Gluconate Cloth  6 each Topical Daily   darbepoetin (ARANESP) injection - DIALYSIS  100 mcg Intravenous Q Mon-HD   docusate sodium  200 mg Oral Daily   guaiFENesin  1,200 mg Oral BID   insulin aspart  0-24 Units Subcutaneous TID WC   insulin detemir  22 Units Subcutaneous Q12H   lactulose  10 g Oral Daily   lidocaine  1 patch Transdermal Q24H   lidocaine-EPINEPHrine       metoCLOPramide (REGLAN) injection  5 mg Intravenous Q8H   midodrine  15 mg Oral TID WC   multivitamin  1 tablet Oral QHS   pantoprazole  40 mg Oral Daily   rosuvastatin  20 mg Oral Daily   sodium chloride flush  3 mL Intravenous Q12H    SUBJECTIVE:  Afebrile overnight with no acute events. Hip/back pain feeling better today.   No Known Allergies   Review of Systems: Review of Systems  Constitutional:  Negative for chills, fever and weight loss.  Respiratory:  Negative for cough, shortness of breath and wheezing.    Cardiovascular:  Negative for chest pain and leg swelling.  Gastrointestinal:  Negative for abdominal pain, constipation, diarrhea, nausea and vomiting.  Skin:  Negative for rash.     OBJECTIVE: Vitals:   11/27/21 1245 11/27/21 1300 11/27/21 1400 11/27/21 1415  BP: 114/67 126/66 (!) 113/57 (!) 113/49  Pulse: 70 80 71 74  Resp: 10 20 12 12   Temp:      TempSrc:      SpO2: 92% (!) 80% 93% 91%  Weight:      Height:       Body mass index is 38.84 kg/m.  Physical Exam Constitutional:      General: He is not in acute distress.    Appearance: He is well-developed.  Cardiovascular:     Rate and Rhythm: Normal rate and regular rhythm.     Heart sounds: Normal heart sounds.  Pulmonary:     Effort: Pulmonary effort is normal.     Breath sounds: Normal breath sounds.  Skin:    General: Skin is warm and dry.  Neurological:     Mental Status: He is alert and oriented to person, place, and time.  Psychiatric:        Behavior: Behavior normal.        Thought Content: Thought content normal.  Judgment: Judgment normal.    Lab Results Lab Results  Component Value Date   WBC 24.5 (H) 11/27/2021   HGB 8.7 (L) 11/27/2021   HCT 27.7 (L) 11/27/2021   MCV 94.2 11/27/2021   PLT 309 11/27/2021    Lab Results  Component Value Date   CREATININE 6.66 (H) 11/27/2021   BUN 52 (H) 11/27/2021   NA 131 (L) 11/27/2021   K 4.3 11/27/2021   CL 93 (L) 11/27/2021   CO2 20 (L) 11/27/2021    Lab Results  Component Value Date   ALT 9 11/18/2021   AST 26 11/18/2021   ALKPHOS 51 11/18/2021   BILITOT 0.8 11/18/2021     Microbiology: Recent Results (from the past 240 hour(s))  Expectorated Sputum Assessment w Gram Stain, Rflx to Resp Cult     Status: None   Collection Time: 11/20/21 11:10 AM   Specimen: Expectorated Sputum  Result Value Ref Range Status   Specimen Description EXPECTORATED SPUTUM  Final   Special Requests NONE  Final   Sputum evaluation   Final    THIS SPECIMEN IS  ACCEPTABLE FOR SPUTUM CULTURE Performed at [facility], [address], [place], [place] [address]    Report Status 11/20/2021 FINAL  Final  Culture, Respiratory w Gram Stain     Status: None   Collection Time: 11/20/21 11:10 AM  Result Value Ref Range Status   Specimen Description EXPECTORATED SPUTUM  Final   Special Requests NONE Reflexed from [id number]  Final   Gram Stain   Final    ABUNDANT WBC PRESENT, PREDOMINANTLY PMN FEW YEAST WITH PSEUDOHYPHAE BUDDING YEAST SEEN RARE GRAM POSITIVE RODS Performed at [facility], [place] [address] [place], [place] [address]    Culture FEW CANDIDA ALBICANS  Final   Report Status 11/23/2021 FINAL  Final  Body fluid culture w Gram Stain     Status: None   Collection Time: 11/22/21  1:25 PM   Specimen: Pleural Fluid  Result Value Ref Range Status   Specimen Description PLEURAL  Final   Special Requests NONE  Final   Gram Stain   Final    WBC PRESENT, PREDOMINANTLY PMN NO ORGANISMS SEEN CYTOSPIN SMEAR    Culture   Final    NO GROWTH 3 DAYS Performed at [facility], [place] [address] [place], [place] [address]    Report Status 11/25/2021 FINAL  Final  Urine Culture     Status: None   Collection Time: 11/23/21  5:45 AM   Specimen: Urine, Clean Catch  Result Value Ref Range Status   Specimen Description URINE, CLEAN CATCH  Final   Special Requests NONE  Final   Culture   Final    NO GROWTH Performed at [facility], [place] [address], [place], [place] [address]    Report Status 11/24/2021 FINAL  Final     [name] Piedra, NP [facility]  11/27/2021  2:56 PM

## 2021-11-27 NOTE — Progress Notes (Signed)
Patient ID: [name] [name], male   DOB: 12-02-1955, 66 y.o.   MRN: [id number] S: No new complaints O:BP (!) 113/53   Pulse 73   Temp 97.7 F (36.5 C) (Oral)   Resp 15   Ht 5\' 10"  (1.778 m)   Wt 122.8 kg   SpO2 92%   BMI 38.84 kg/m   Intake/Output Summary (Last 24 hours) at 11/27/2021 0959 Last data filed at 11/27/2021 0200 Gross per 24 hour  Intake 1305.93 ml  Output --  Net 1305.93 ml   Intake/Output: I/O last 3 completed shifts: In: 2135.7 [P.O.:780; I.V.:840.8; Blood:315; IV Piggyback:199.9] Out: 3000 [Other:3000]  Intake/Output this shift:  No intake/output data recorded. Weight change: -1.2 kg Gen: NAD CVS: RRR Resp: CTA Abd: +BS, soft, NT/Nd Ext: trace pretibial edema bilaterally  Recent Labs  Lab 11/23/21 1613 11/24/21 0455 11/24/21 1543 11/25/21 0358 11/25/21 2210 11/26/21 0436 11/27/21 0145  NA 128* 134* 133* 130* 127* 130* 131*  K 4.6 3.8 4.3 4.6 3.6 4.1 4.3  CL 93* 94* 94* 94* 88* 91* 93*  CO2 18* 27 24 21* 22 23 20*  GLUCOSE 267* 112* 126* 187* 201* 241* 243*  BUN 69* 33* 46* 52* 31* 35* 52*  CREATININE 6.36* 3.38* 4.99* 6.09* 4.01* 4.92* 6.66*  ALBUMIN 3.0* 3.4* 3.1* 2.9* 2.8* 2.5* 2.6*  CALCIUM 8.2* 8.6* 8.7* 8.5* 7.9* 8.2* 8.4*  PHOS 7.7* 3.3 5.2* 6.0* 3.9 5.2* 7.8*   Liver Function Tests: Recent Labs  Lab 11/22/21 2239 11/23/21 0434 11/25/21 2210 11/26/21 0436 11/27/21 0145  PROT 6.6  --   --   --   --   ALBUMIN  --    < > 2.8* 2.5* 2.6*   < > = values in this interval not displayed.   No results for input(s): LIPASE, AMYLASE in the last 168 hours. No results for input(s): AMMONIA in the last 168 hours. CBC: Recent Labs  Lab 11/24/21 0455 11/25/21 0358 11/26/21 0436 11/26/21 1513 11/27/21 0145  WBC 23.1* 31.5* 25.2*  25.4* 25.2* 24.5*  NEUTROABS  --   --  20.1*  --   --   HGB 8.0* 7.4* 7.6*  7.6* 8.5* 8.7*  HCT 24.3* 23.1* 24.0*  23.8* 26.2* 27.7*  MCV 92.0 93.1 94.9  93.0 91.3 94.2  PLT 293 310 293  287 293 309   Cardiac  Enzymes: No results for input(s): CKTOTAL, CKMB, CKMBINDEX, TROPONINI in the last 168 hours. CBG: Recent Labs  Lab 11/26/21 0641 11/26/21 1100 11/26/21 1557 11/26/21 1937 11/27/21 0702  GLUCAP 220* 194* 224* 276* 223*    Iron Studies: No results for input(s): IRON, TIBC, TRANSFERRIN, FERRITIN in the last 72 hours. Studies/Results: DG Chest Port 1 View  Result Date: 11/26/2021 CLINICAL DATA:  Postoperative CABG EXAM: PORTABLE CHEST 1 VIEW COMPARISON:  11/25/2021, 11/23/2021 FINDINGS: Right-sided PICC line and left IJ central venous catheter are stable in positioning. Prior sternotomy and CABG. Stable cardiomegaly. Aortic atherosclerosis. Left basilar opacity, increased from 11/23/2021. Loculated left-sided pleural effusion, better seen on recent CT. Right lung remains clear. No pneumothorax. IMPRESSION: Persistent left basilar opacity with loculated left-sided pleural effusion, better seen on recent CT. Electronically Signed   By: Davina Poke D.O.   On: 11/26/2021 08:45   CT CHEST ABDOMEN PELVIS WO CONTRAST  Result Date: 11/25/2021 CLINICAL DATA:  Sepsis, elevated WBC. EXAM: CT CHEST, ABDOMEN AND PELVIS WITHOUT CONTRAST TECHNIQUE: Multidetector CT imaging of the chest, abdomen and pelvis was performed following the standard protocol without IV contrast. RADIATION  DOSE REDUCTION: This exam was performed according to the departmental dose-optimization program which includes automated exposure control, adjustment of the mA and/or kV according to patient size and/or use of iterative reconstruction technique. COMPARISON:  None Available. FINDINGS: CT CHEST FINDINGS Cardiovascular: Heart is normal in size and there is a hyperdense pericardial effusion measuring up to 1.3 cm in thickness. Three-vessel coronary artery calcifications are noted. There is atherosclerotic calcification of the aorta without evidence of aneurysm. Pulmonary trunk is mildly distended which may be associated with underlying  pulmonary artery hypertension. The distal tip of a central venous catheter terminates in the superior vena cava. Mediastinum/Nodes: No mediastinal or axillary lymphadenopathy by size criteria. Evaluation of the hila is limited due to lack of IV contrast. The thyroid gland and esophagus are within normal limits. A small amount of debris is noted in the trachea. Lungs/Pleura: Mild atelectasis or infiltrate is noted in the lower lobes bilaterally. There is a small pleural effusion on the right and a moderate loculated pleural effusion on the left. No pneumothorax. A few scattered pulmonary nodules are noted on the right measuring up to 3 mm, axial image 65. Musculoskeletal: Sternotomy wires are noted in the midline. No acute or suspicious osseous abnormality. CT ABDOMEN PELVIS FINDINGS Hepatobiliary: Subcentimeter hypodensity is present in the anterior right lobe of the liver which is too small to further characterize. The gallbladder is without stones. No biliary ductal dilatation. Pancreas: Unremarkable. No pancreatic ductal dilatation or surrounding inflammatory changes. Spleen: Normal in size without focal abnormality. Adrenals/Urinary Tract: The adrenal glands are within normal limits. A nonobstructive calculus is noted on the left. No ureteral calculus or obstructive uropathy bilaterally. The bladder is unremarkable. Stomach/Bowel: Stomach is within normal limits. Appendix appears normal. No evidence of bowel wall thickening, distention, or inflammatory changes. No free air or pneumatosis. A moderate amount of retained stool is noted in the colon. Vascular/Lymphatic: Aortic atherosclerosis. No enlarged abdominal or pelvic lymph nodes. Reproductive: Prostate is unremarkable. Other: Fat containing inguinal hernias bilaterally. Small fat containing umbilical hernia. Musculoskeletal: There is asymmetric enlargement of the psoas muscle on the left with a hypodense region with a hyperdense rim measuring 8.3 x 4.2 x 3.5  cm. There is surrounding hyperdense material in the retroperitoneum on the left. Degenerative changes are noted in the lumbar spine. No acute osseous abnormality. IMPRESSION: 1. Asymmetric enlargement of the psoas muscle on the left containing a hypodense region with surrounding hyperdense material within the muscle and in the retroperitoneum on the left, possible hemorrhage versus abscess. 2. Atelectasis or infiltrate in the lower lobes bilaterally. 3. Small right pleural effusion and moderate loculated pleural effusion on the left. 4. Small hyperdense pericardial effusion, possibly reflecting blood products versus infection. 5. A few scattered pulmonary nodules bilaterally measuring up to 3 mm. No follow-up needed if patient is low-risk (and has no known or suspected primary neoplasm). Non-contrast chest CT can be considered in 12 months if patient is high-risk. This recommendation follows the consensus statement: Guidelines for Management of Incidental Pulmonary Nodules Detected on CT Images: From the Fleischner Society 2017; Radiology 2017; 284:228-243. 6. Aortic atherosclerosis. 7. Nonobstructive left renal calculus. Electronically Signed   By: Brett Fairy M.D.   On: 11/25/2021 21:55    amiodarone  400 mg Oral BID   aspirin EC  325 mg Oral Daily   Or   aspirin  324 mg Per Tube Daily   bisacodyl  10 mg Rectal Q0600   Chlorhexidine Gluconate Cloth  6 each Topical  Daily   darbepoetin (ARANESP) injection - DIALYSIS  100 mcg Intravenous Q Mon-HD   docusate sodium  200 mg Oral Daily   guaiFENesin  1,200 mg Oral BID   insulin aspart  0-24 Units Subcutaneous TID WC   insulin detemir  22 Units Subcutaneous Q12H   lactulose  10 g Oral Daily   lidocaine  1 patch Transdermal Q24H   lidocaine-EPINEPHrine       metoCLOPramide (REGLAN) injection  5 mg Intravenous Q8H   midodrine  15 mg Oral TID WC   multivitamin  1 tablet Oral QHS   pantoprazole  40 mg Oral Daily   rosuvastatin  20 mg Oral Daily   sodium  chloride flush  3 mL Intravenous Q12H    BMET    Component Value Date/Time   NA 131 (L) 11/27/2021 0145   NA 139 10/21/2021 1547   K 4.3 11/27/2021 0145   CL 93 (L) 11/27/2021 0145   CO2 20 (L) 11/27/2021 0145   GLUCOSE 243 (H) 11/27/2021 0145   BUN 52 (H) 11/27/2021 0145   BUN 29 (H) 10/21/2021 1547   CREATININE 6.66 (H) 11/27/2021 0145   CALCIUM 8.4 (L) 11/27/2021 0145   GFRNONAA 8 (L) 11/27/2021 0145   GFRAA 42 (L) 10/18/2019 1006   CBC    Component Value Date/Time   WBC 24.5 (H) 11/27/2021 0145   RBC 2.94 (L) 11/27/2021 0145   HGB 8.7 (L) 11/27/2021 0145   HGB 13.4 10/21/2021 1547   HCT 27.7 (L) 11/27/2021 0145   HCT 40.8 10/21/2021 1547   PLT 309 11/27/2021 0145   PLT 246 10/21/2021 1547   MCV 94.2 11/27/2021 0145   MCV 91 10/21/2021 1547   MCH 29.6 11/27/2021 0145   MCHC 31.4 11/27/2021 0145   RDW 15.9 (H) 11/27/2021 0145   RDW 14.6 10/21/2021 1547   LYMPHSABS 1.4 11/26/2021 0436   MONOABS 2.8 (H) 11/26/2021 0436   EOSABS 0.3 11/26/2021 0436   BASOSABS 0.1 11/26/2021 0436    Assessment/Plan: Acute kidney injury on chronic kidney disease stage IIIa: -AKI likely ischemic ATN in the setting of septic versus cardiogenic shock.  Started on CRRT 5/26 for extracorporeal volume unloading after he had fixed urine output on Lasix drip that was inadequate for volume unloading. CRRT 5/26-5/31.   New temp HD catheter placed 11/21/21 and has had IHD on 6/3 and 11/25/21.  Will continue with MWF schedule. unable to receive [facility] given leukocytosis/infection.  no evidence of renal recovery at this junction, will need his HD catheter converted to [facility] this week if WBC continues to improve. Will need outpatient HD to be arranged  Coronary artery disease status post three-vessel CABG: -on aspirin/statin. Left hip/back pain - CT scan with left psoas muscle hematoma vs abscess.  Given leukocytosis, may need IR for aspiration and cultures. Acute on chronic dCHF -UF as tolerated with HD,  volume status improving -milrinone stopped 5/29 Shock -possibly from sepsis+cardiogenic. Abx/pressor support per primary service. Sepsis w/u per CCM/  -off pressors and on midodrine now Leukocytosis - now on Unasyn to cover possible aspiration PNA. Atrial fibrillation: -Status post MAZE procedure at the time of CABG. Per primary service, on amio. S/p tee-dccv 6/1.  S/p a successful cardioversion 11/25/21.  Anemia: -Likely associated with recent surgery and postoperative inflammatory response.  transfuse PRN for hgb <7-iron panel=iron deficient but will hold off on adding Fe given that he is receiving abx and there is no clearcut source of infection. ESA started 6/5 Hyponatremia -  Managing with HD and UF, improved Left Pleural Effusion -s/p thora 6/2, 900cc removed  Donetta Potts, MD [facility]

## 2021-11-27 NOTE — Progress Notes (Signed)
   11/27/21 2000  Post Treatment  Rinseback Volume (mL) 300 mL  Dialyzer Clearance Clear  Duration of HD Treatment -hour(s) 3.5 hour(s)  Hemodialysis Intake (mL) 0 mL  UF Total -Machine (mL) 2500 mL  Net UF (mL) 2500 mL  Tolerated HD Treatment Yes  Post-Hemodialysis Comments tolerated well goal met   Treatment completed tolerated well no s/s of distress to note

## 2021-11-27 NOTE — Procedures (Signed)
Pre procedural Dx: Left sided retroperitoneal hematoma; Left sided pleural effusion. Post procedural Dx: Same  Technically successful CT guided aspiration of a total of 55 cc of thick blood products from left sided RP hematoma.  As the collection is favored to represent a sterile hematoma, a drainage catheter was not placed.  All aspirated sample was capped and sent to the laboratory for analysis.    Note, dedicated imaging of the base of the left hemithorax demonstrates an unchanged small/trace left sided effusion, too small to allow for percutaneous sampling at this time.  No thoracentesis attempted.   EBL: Trace Complications: None immediate  Ronny Bacon, MD Pager #: [phone]

## 2021-11-27 NOTE — Progress Notes (Signed)
OT Cancellation Note  Patient Details Name: [name] [name] MRN: [id number] DOB: 06-28-1955   Cancelled Treatment:    Reason Eval/Treat Not Completed: Patient declined, stating he has a lot going on today.    [name] [name] 11/27/2021, 8:58 AM

## 2021-11-27 NOTE — Progress Notes (Signed)
Patient ID: [name] [name], male   DOB: [birth year], 66 y.o.   MRN: [id number]     Advanced Heart Failure Rounding Note  PCP-Cardiologist: None   Subjective:    Abx started 5/25 for suspected septic shock.  Initial abx completed and afebrile, but WBCs up, 17>>19.8>>22.2>>25>>23>>31.5>>25 >>24.5. CCM added rocephin 06/01 for possible aspiration. On 6/2, abx transitioned to vanc/cefepime and micafungin added (yeast in resp culture). On 6/6, abx transitioned to Unasyn and micafungin/vancomycin stopped.   CT chest/abd/pelvis with left retroperitoneal fluid collection (?hemorrhage vs abscess), bilateral lower lobe infiltrates vs atelectasis, moderate loculated left pleural effusion.   S/p TEE/DCCV to SR 06/01. 6/5 S/P DC-CV--->NSR today.   Left thoracentesis 6/2 with 900 cc off.   Echo: Difficult study. LV EF 55-60%, RV probably normal, no pericardial effusion.   Getting MWF HD.   Patient had 1 unit PRBCs on 6/6.   Objective:   Weight Range: 122.8 kg Body mass index is 38.84 kg/m.   Vital Signs:   Temp:  [97.7 F (36.5 C)-98.7 F (37.1 C)] 97.7 F (36.5 C) (06/07 0700) Pulse Rate:  [68-82] 70 (06/07 0700) Resp:  [10-21] 10 (06/07 0700) BP: (96-126)/(43-80) 117/43 (06/07 0700) SpO2:  [88 %-96 %] 90 % (06/07 0700) Weight:  [122.8 kg] 122.8 kg (06/07 0215) Last BM Date : 11/26/21  Weight change: Filed Weights   11/25/21 1938 11/26/21 0640 11/27/21 0215  Weight: 121 kg 121.3 kg 122.8 kg    Intake/Output:   Intake/Output Summary (Last 24 hours) at 11/27/2021 0754 Last data filed at 11/27/2021 0200 Gross per 24 hour  Intake 1432.82 ml  Output --  Net 1432.82 ml      Physical Exam   General: NAD Neck: No JVD, no thyromegaly or thyroid nodule.  Lungs: Decreased at bases.  CV: Nondisplaced PMI.  Heart regular S1/S2, no S3/S4, no murmur.  Trace ankle edema.  Abdomen: Soft, nontender, no hepatosplenomegaly, no distention.  Skin: Intact without lesions or rashes.  Neurologic:  Alert and oriented x 3.  Psych: Normal affect. Extremities: No clubbing or cyanosis.  HEENT: Normal.    Telemetry   SR 70s personally checked.   Labs    CBC Recent Labs    11/26/21 0436 11/26/21 1513 11/27/21 0145  WBC 25.2*  25.4* 25.2* 24.5*  NEUTROABS 20.1*  --   --   HGB 7.6*  7.6* 8.5* 8.7*  HCT 24.0*  23.8* 26.2* 27.7*  MCV 94.9  93.0 91.3 94.2  PLT 293  287 293 096   Basic Metabolic Panel Recent Labs    11/26/21 0436 11/27/21 0145  NA 130* 131*  K 4.1 4.3  CL 91* 93*  CO2 23 20*  GLUCOSE 241* 243*  BUN 35* 52*  CREATININE 4.92* 6.66*  CALCIUM 8.2* 8.4*  MG 1.8 2.1  PHOS 5.2* 7.8*   Liver Function Tests Recent Labs    11/26/21 0436 11/27/21 0145  ALBUMIN 2.5* 2.6*   No results for input(s): LIPASE, AMYLASE in the last 72 hours. Cardiac Enzymes No results for input(s): CKTOTAL, CKMB, CKMBINDEX, TROPONINI in the last 72 hours.  BNP: BNP (last 3 results) No results for input(s): BNP in the last 8760 hours.  ProBNP (last 3 results) No results for input(s): PROBNP in the last 8760 hours.   D-Dimer No results for input(s): DDIMER in the last 72 hours. Hemoglobin A1C No results for input(s): HGBA1C in the last 72 hours. Fasting Lipid Panel No results for input(s): CHOL, HDL, LDLCALC, TRIG, CHOLHDL, LDLDIRECT in  the last 72 hours. Thyroid Function Tests No results for input(s): TSH, T4TOTAL, T3FREE, THYROIDAB in the last 72 hours.  Invalid input(s): FREET3  Other results:   Imaging    No results found.   Medications:     Scheduled Medications:  amiodarone  400 mg Oral BID   aspirin EC  325 mg Oral Daily   Or   aspirin  324 mg Per Tube Daily   bisacodyl  10 mg Rectal Q0600   Chlorhexidine Gluconate Cloth  6 each Topical Daily   darbepoetin (ARANESP) injection - DIALYSIS  100 mcg Intravenous Q Mon-HD   docusate sodium  200 mg Oral Daily   guaiFENesin  1,200 mg Oral BID   insulin aspart  0-24 Units Subcutaneous TID WC    insulin detemir  22 Units Subcutaneous Q12H   lactulose  10 g Oral Daily   lidocaine  1 patch Transdermal Q24H   metoCLOPramide (REGLAN) injection  5 mg Intravenous Q8H   midodrine  15 mg Oral TID WC   multivitamin  1 tablet Oral QHS   pantoprazole  40 mg Oral Daily   rosuvastatin  20 mg Oral Daily   sodium chloride flush  3 mL Intravenous Q12H    Infusions:  sodium chloride Stopped (11/26/21 2303)   ampicillin-sulbactam (UNASYN) IV Stopped (11/26/21 2156)   anticoagulant sodium citrate      PRN Medications: acetaminophen, alteplase, anticoagulant sodium citrate, heparin, HYDROmorphone (DILAUDID) injection, ipratropium-albuterol, lidocaine (PF), lidocaine-prilocaine, ondansetron (ZOFRAN) IV, oxyCODONE, pentafluoroprop-tetrafluoroeth, sodium chloride flush, traMADol    Patient Profile   66 y.o. male with history of CM with recovered EF, persistent atrial fibrillation, DM II, CKD, sick sinus syndrome, OSA, prior smoker. Underwent CABG X 3 and MAZE/PVI on 05/23>>post-op decrease in UOP and AKI with volume overload>>postcardiotomy shock requiring inotrope and pressor support  Assessment/Plan   1. Shock: Echo reviewed, LV EF 55-60%, RV poorly visualized but probably near-normal, no significant pericardial effusion. Suspect sepsis physiology with distributive shock.  Improved with broad spectrum abx. Lactate has cleared. Bcx 5/25 remain NGTD. Now off pressors/resolved.   - Continue midodrine to 15 mg TID 2. Acute on chronic primarily diastolic CHF: LV EF 29-93%, RV poorly visualized but probably near-normal, no significant pericardial effusion.  Patient is still volume overloaded, suspect primarily right heart failure physiology.  CVVHD started on 5/26. Now getting iHD and tolerating.  3. AKI on CKD stage 3: Baseline SCr 1.3-1.5 .  Suspect due to shock/ATN as well as elevated renal venous pressure in setting of volume overload, ileus, abdominal distention. CVVHD started 5/26. Remains nearly  anuric.  Lost HD line 5/31, replaced 06/01. Now getting iHD. Minimal urine output.  - MWF HD.   4. CAD: S/p CABG. No chest pain.  - Continue ASA, statin.  5. Atrial fibrillation with RVR: Has had Maze and LA appendage clip at time of CABG on 11/12/21. S/p TEE/DCCV to SR on 06//01. Back in AF by 6/2.  S/P DC-CV --> NSR. There is possible RP hematoma but hgb has stayed stable after yesterday's transfusion.  Heparin on hold for IR aspiration of RP collection.  - Continue heparin gtt as long as hgb stays relatively stable, had recent DCCV so higher risk for CVA.  Eventual transition to Eliquis.  - Continue amiodarone 400 mg po twice a day.  6. DM2: SSI.  7. ID: Persistent leukocytosis.  Septic shock initially, resolved.  Blood cultures NGTD.  Treated for HCAP, completed vanc and cefepime.  With WBCs rising again  and concern for aspiration, he was started back on vanc/cefepime and micafungin given yeast in sputum. CT chest/abd/pelvis with left retroperitoneal fluid collection (?hemorrhage vs abscess), bilateral lower lobe infiltrates vs atelectasis, moderate loculated left pleural effusion.  He was transitioned 6/6 to Unasyn to cover aspiration PNA.  - Continue Unasyn.  - IR aspiration of RP fluid collection today.  - If pleural effusion continues to collect on left, may need repeat thoracentesis.  8. Ileus: Improved.  9. Pulmonary: Respiratory status improving with fluid removal and abx. Thoracentesis on 6/2.  - HD for volume removal  - CCM following.   10. RP fluid collection: Blood versus abscess, aspiration by IR today.   Mobilize.   Length of Stay: [place], [place]  11/27/2021, 7:54 AM  Advanced Heart Failure Team Pager [phone] (M-F; 7a - 5p)  Please contact [facility] for night-coverage after hours (5p -7a ) and weekends on [email or web address]

## 2021-11-27 NOTE — Progress Notes (Signed)
2 Days Post-Op Procedure(s) (LRB): CARDIOVERSION (N/A) Subjective: Had a good night, made some urine IR drainge of L retroperitoneal collection planned today - culture fluid Will remove EPWs after procedure while heparin on hold 6 hours post procedure HD planned M-W -F  Objective: Vital signs in last 24 hours: Temp:  [97.7 F (36.5 C)-98.7 F (37.1 C)] 97.7 F (36.5 C) (06/07 0700) Pulse Rate:  [68-82] 73 (06/07 0800) Cardiac Rhythm: Normal sinus rhythm (06/06 2000) Resp:  [10-21] 15 (06/07 0800) BP: (96-126)/(43-80) 113/53 (06/07 0800) SpO2:  [88 %-96 %] 92 % (06/07 0800) Weight:  [122.8 kg] 122.8 kg (06/07 0215)  Hemodynamic parameters for last 24 hours: CVP:  [9 mmHg-13 mmHg] 12 mmHg  Intake/Output from previous day: 06/06 0701 - 06/07 0700 In: 1432.8 [P.O.:480; I.V.:438; Blood:315; IV Piggyback:199.9] Out: -  Intake/Output this shift: No intake/output data recorded.  Exam Nsr Abd soft Neuro intact  Lab Results: Recent Labs    11/26/21 1513 11/27/21 0145  WBC 25.2* 24.5*  HGB 8.5* 8.7*  HCT 26.2* 27.7*  PLT 293 309   BMET:  Recent Labs    11/26/21 0436 11/27/21 0145  NA 130* 131*  K 4.1 4.3  CL 91* 93*  CO2 23 20*  GLUCOSE 241* 243*  BUN 35* 52*  CREATININE 4.92* 6.66*  CALCIUM 8.2* 8.4*    PT/INR:  Recent Labs    11/27/21 0145  LABPROT 15.9*  INR 1.3*   ABG    Component Value Date/Time   PHART 7.332 (L) 11/15/2021 0410   HCO3 24.2 11/16/2021 0414   TCO2 26 11/16/2021 0414   ACIDBASEDEF 3.0 (H) 11/16/2021 0414   O2SAT 56.1 11/21/2021 0612   CBG (last 3)  Recent Labs    11/26/21 1557 11/26/21 1937 11/27/21 0702  GLUCAP 224* 276* 223*    Assessment/Plan: S/P Procedure(s) (LRB): CARDIOVERSION (N/A) IR placement of L psoas drain Cont anrtibiotics per ID Resume heparin later today - cont until tunneled HD catheter placed after WBC better  LOS: 15 days    Dahlia Byes 11/27/2021

## 2021-11-27 NOTE — Progress Notes (Signed)
CT Surgery PM Rounds  After HD remove 2.5 L rhythm now a-flutter 88 BP stable  Will bolus with iv amio  Blood pressure (!) 103/53, pulse 93, temperature 98.3 F (36.8 C), temperature source Oral, resp. rate 17, height 5\' 10"  (1.778 m), weight 122.8 kg, SpO2 96 %.

## 2021-11-27 NOTE — Progress Notes (Signed)
Out Patient Arrangements:  Have been requested to follow for possible placement. Please advise if services will be needed.   Linus Orn [facility] [phone]

## 2021-11-27 NOTE — Progress Notes (Signed)
PT Cancellation Note  Patient Details Name: [name] [name] MRN: [id number] DOB: 12-11-1955   Cancelled Treatment:    Reason Eval/Treat Not Completed: Patient at procedure or test/unavailable, about to start HD per RN. Will follow-up for PT treatment tomorrow as pt willing to participate.  Mabeline Caras, PT, DPT Acute Rehabilitation Services  Pager [phone] Office [place] 11/27/2021, 3:03 PM

## 2021-11-27 NOTE — Consult Note (Signed)
Chief Complaint: Patient was seen in consultation today for left psoas aspiration/possible drain placement.  Referring Physician(s): Dahlia Byes, MD  Supervising Physician: Sandi Mariscal  Patient Status: [facility] - In-pt  History of Present Illness: [name] [name] is a 66 y.o. male with a past medical history significant for OSA on CPAP, DM, sinus node dysfunction, HTN, HLD, CAD, CHF, a.fib, CKD who presented to [facility] on 11/12/21 for planned multivessel CABG with Maze procedure which was performed without immediate complication. His post operative course was complicated by oliguria, NSVT vs a.fib/RVR, possible ileus, shock and worsening respiratory status. He was found to have AKI on CKD and required CRRT. He has had persistent leukocytosis since procedure on 5/23 and a CT chest/abdomen/pelvis w/o contrast was obtained 11/25/21 which showed:  1. Asymmetric enlargement of the psoas muscle on the left containing a hypodense region with surrounding hyperdense material within the muscle and in the retroperitoneum on the left, possible hemorrhage versus abscess. 2. Atelectasis or infiltrate in the lower lobes bilaterally. 3. Small right pleural effusion and moderate loculated pleural effusion on the left. 4. Small hyperdense pericardial effusion, possibly reflecting blood products versus infection. 5. A few scattered pulmonary nodules bilaterally measuring up to 3 mm. No follow-up needed if patient is low-risk (and has no known or suspected primary neoplasm). Non-contrast chest CT can be considered in 12 months if patient is high-risk. This recommendation follows the consensus statement: Guidelines for Management of Incidental Pulmonary Nodules Detected on CT Images: From the Fleischner Society 2017; Radiology 2017; 284:228-243. 6. Aortic atherosclerosis. 7. Nonobstructive left renal calculus.  IR has been consulted for left psoas aspiration/possible drain placement.  [name] [name] reports the  pain in his left back area is worse than the pain from his heart surgery, he is hopeful that this procedure will help to reduce that pain as it makes it hard for him to move or sleep well. He understands the procedure and is agreeable to proceed.  Past Medical History:  Diagnosis Date   Atrial fibrillation ([place])    Benign essential HTN 07/30/2014   Bradycardia 01/25/2015   CHF (congestive heart failure) (HCC)    Chronic anticoagulation 40/01/6760   Chronic systolic CHF (congestive heart failure), NYHA class 3 ([place]) 08/30/2014   Overview:  Global ef 30%   CKD (chronic kidney disease)    Class 3 severe obesity in adult ([place]) 04/28/2017   Coronary artery disease    Diabetes mellitus without complication ([place])    Diabetic neuropathy ([place]) 07/28/2014   Dysrhythmia    Erectile dysfunction    High risk medication use 05/26/2018   Hypertension    Hypertensive heart disease with heart failure ([place]) 07/30/2014   Lumbago    LV dysfunction 04/28/2017   Mixed hyperlipidemia 07/28/2014   OSA (obstructive sleep apnea)    CPAP   Paroxysmal atrial fibrillation ([place]) 07/28/2014   Sinus node dysfunction ([place]) 04/21/2016   Testicular hypofunction    Uncontrolled type 2 diabetes mellitus with microalbuminuric diabetic nephropathy 07/28/2014    Past Surgical History:  Procedure Laterality Date   BUBBLE STUDY  11/21/2021   Procedure: BUBBLE STUDY;  Surgeon: Larey Dresser, MD;  Location: [place];  Service: Cardiovascular;;   CARDIOVERSION N/A 11/21/2021   Procedure: CARDIOVERSION;  Surgeon: Larey Dresser, MD;  Location: [facility] ENDOSCOPY;  Service: Cardiovascular;  Laterality: N/A;   CARDIOVERSION N/A 11/25/2021   Procedure: CARDIOVERSION;  Surgeon: Larey Dresser, MD;  Location: [facility] ENDOSCOPY;  Service: Cardiovascular;  Laterality: N/A;  CORONARY ARTERY BYPASS GRAFT N/A 11/12/2021   Procedure: CORONARY ARTERY BYPASS GRAFTING (CABG) x  3 USING LEFT INTERNAL MAMMARY ARTERY AND RIGHT GREATER  SAPHENOUS VEIN;  Surgeon: Dahlia Byes, MD;  Location: [place];  Service: Open Heart Surgery;  Laterality: N/A;   ELECTROPHYSIOLOGIC STUDY N/A 01/18/2015   Procedure: CARDIOVERSION;  Surgeon: [name], MD;  Location: [facility] ORS;  Service: Cardiovascular;  Laterality: N/A;   ENDOVEIN HARVEST OF GREATER SAPHENOUS VEIN  11/12/2021   Procedure: ENDOVEIN HARVEST OF GREATER SAPHENOUS VEIN;  Surgeon: Dahlia Byes, MD;  Location: [place];  Service: Open Heart Surgery;;   EYE SURGERY Bilateral 2022   LEFT HEART CATH AND CORONARY ANGIOGRAPHY N/A 10/24/2021   Procedure: LEFT HEART CATH AND CORONARY ANGIOGRAPHY;  Surgeon: Jettie Booze, MD;  Location: [place] CV LAB;  Service: Cardiovascular;  Laterality: N/A;   MAZE N/A 11/12/2021   Procedure: MAZE;  Surgeon: Dahlia Byes, MD;  Location: [place];  Service: Open Heart Surgery;  Laterality: N/A;   TEE WITHOUT CARDIOVERSION N/A 11/12/2021   Procedure: TRANSESOPHAGEAL ECHOCARDIOGRAM (TEE);  Surgeon: Dahlia Byes, MD;  Location: [place];  Service: Open Heart Surgery;  Laterality: N/A;   TEE WITHOUT CARDIOVERSION N/A 11/21/2021   Procedure: TRANSESOPHAGEAL ECHOCARDIOGRAM (TEE);  Surgeon: Larey Dresser, MD;  Location: [facility] ENDOSCOPY;  Service: Cardiovascular;  Laterality: N/A;    Allergies: Patient has no known allergies.  Medications: Prior to Admission medications   Medication Sig Start Date End Date Taking? Authorizing Provider  Dulaglutide (TRULICITY) 3 QX/4.5WT SOPN Inject 3 mg into the skin every Monday.   Yes [provider]  FARXIGA 10 MG TABS tablet Take 10 mg by mouth daily. 08/13/18  Yes [provider]  fluticasone furoate-vilanterol (BREO ELLIPTA) 200-25 MCG/ACT AEPB Inhale 1 puff into the lungs daily. 01/02/18  Yes [provider]  furosemide (LASIX) 20 MG tablet Take 20 mg by mouth daily.   Yes [provider]  glipiZIDE (GLUCOTROL) 10 MG tablet Take 10 mg by mouth 2 (two) times daily before a  meal.   Yes [provider]  losartan (COZAAR) 50 MG tablet Take 50 mg by mouth daily. 10/02/20  Yes [provider]  metFORMIN (GLUCOPHAGE) 1000 MG tablet Take 1 tablet (1,000 mg total) by mouth 2 (two) times daily with a meal. 10/26/21  Yes Jettie Booze, MD  metoprolol succinate (TOPROL-XL) 50 MG 24 hr tablet Take 1 tablet (50 mg total) by mouth daily. Take with or immediately following a meal. 10/21/21  Yes Camnitz, Ocie Doyne, MD  rivaroxaban (XARELTO) 20 MG TABS tablet Take 1 tablet (20 mg total) by mouth daily with supper. 10/25/21  Yes Jettie Booze, MD  rosuvastatin (CRESTOR) 20 MG tablet Take 20 mg by mouth daily. 03/19/21  Yes [provider]  albuterol (VENTOLIN HFA) 108 (90 Base) MCG/ACT inhaler Inhale 2 puffs into the lungs every 6 (six) hours as needed for wheezing or shortness of breath.    [provider]     Family History  Problem Relation Age of Onset   CAD Brother    Drug abuse Brother    Lung cancer Father    CAD Mother     Social History   Socioeconomic History   Marital status: Married    Spouse name: Not on file   Number of children: Not on file   Years of education: Not on file   Highest education level: Not on file  Occupational History   Not  on file  Tobacco Use   Smoking status: Former    Types: Cigarettes    Quit date: 2007    Years since quitting: 16.4   Smokeless tobacco: Never  Vaping Use   Vaping Use: Never used  Substance and Sexual Activity   Alcohol use: No   Drug use: Yes    Frequency: 1.0 times per week    Types: Marijuana   Sexual activity: Not on file  Other Topics Concern   Not on file  Social History Narrative   Not on file   Social Determinants of Health   Financial Resource Strain: Not on file  Food Insecurity: Not on file  Transportation Needs: Not on file  Physical Activity: Not on file  Stress: Not on file  Social Connections: Not on file     Review of Systems: A 12  point ROS discussed and pertinent positives are indicated in the HPI above.  All other systems are negative.  Review of Systems  Constitutional:  Negative for chills and fever.  Respiratory:  Negative for cough and shortness of breath.   Cardiovascular:  Positive for chest pain (with coughing mostly).  Gastrointestinal:  Negative for abdominal pain, diarrhea, nausea and vomiting.  Musculoskeletal:  Positive for back pain.  Neurological:  Negative for dizziness and headaches.   Vital Signs: BP (!) 113/53   Pulse 73   Temp 97.7 F (36.5 C) (Oral)   Resp 15   Ht 5\' 10"  (1.778 m)   Wt 270 lb 11.6 oz (122.8 kg)   SpO2 92%   BMI 38.84 kg/m   Physical Exam Vitals and nursing note reviewed.  Constitutional:      General: He is not in acute distress. HENT:     Head: Normocephalic.     Mouth/Throat:     Mouth: Mucous membranes are moist.     Pharynx: Oropharynx is clear. No oropharyngeal exudate or posterior oropharyngeal erythema.  Cardiovascular:     Rate and Rhythm: Normal rate and regular rhythm.     Comments: Midline sternotomy incision -- appears to be healing well (+) Right neck trialysis (+) left neck trialysis Pulmonary:     Effort: Pulmonary effort is normal.     Breath sounds: Normal breath sounds.  Abdominal:     General: There is no distension.     Palpations: Abdomen is soft.     Tenderness: There is no abdominal tenderness.  Skin:    General: Skin is warm and dry.  Neurological:     Mental Status: He is alert and oriented to person, place, and time.  Psychiatric:        Mood and Affect: Mood normal.        Behavior: Behavior normal.        Thought Content: Thought content normal.        Judgment: Judgment normal.     MD Evaluation Airway: WNL Heart: WNL Heart  comments: recent CABG Abdomen: WNL Chest/ Lungs: WNL ASA  Classification: 3 Mallampati/Airway Score: Two   Imaging: DG Chest 2 View  Result Date: 11/09/2021 CLINICAL DATA:  Preop chest  radiograph. EXAM: CHEST - 2 VIEW COMPARISON:  Cardiac CT dated 10/18/2021. FINDINGS: No focal consolidation, pleural effusion or pneumothorax. The cardiac silhouette is within limits. Atherosclerotic calcification of the aortic arch. No acute osseous pathology. IMPRESSION: No active cardiopulmonary disease. Electronically Signed   By: Anner Crete M.D.   On: 11/09/2021 22:51   DG Abd 1 View  Result Date: 11/13/2021  CLINICAL DATA:  NG tube placement EXAM: ABDOMEN - 1 VIEW COMPARISON:  11/13/2021 FINDINGS: Enteric tube tip is localized to the right upper quadrant consistent with location in the distal stomach. Gas-filled mildly distended small bowel as seen previously. IMPRESSION: Enteric tube tip is in the right upper quadrant consistent with location in the distal stomach. Electronically Signed   By: Lucienne Capers M.D.   On: 11/13/2021 18:04   DG Chest Port 1 View  Result Date: 11/26/2021 CLINICAL DATA:  Postoperative CABG EXAM: PORTABLE CHEST 1 VIEW COMPARISON:  11/25/2021, 11/23/2021 FINDINGS: Right-sided PICC line and left IJ central venous catheter are stable in positioning. Prior sternotomy and CABG. Stable cardiomegaly. Aortic atherosclerosis. Left basilar opacity, increased from 11/23/2021. Loculated left-sided pleural effusion, better seen on recent CT. Right lung remains clear. No pneumothorax. IMPRESSION: Persistent left basilar opacity with loculated left-sided pleural effusion, better seen on recent CT. Electronically Signed   By: Davina Poke D.O.   On: 11/26/2021 08:45   DG Chest Port 1 View  Result Date: 11/23/2021 CLINICAL DATA:  CABG EXAM: PORTABLE CHEST 1 VIEW COMPARISON:  11/22/2021 FINDINGS: Right-sided PICC line and left IJ central venous catheter are stable in positioning. Prior sternotomy and CABG. Stable cardiomegaly. Previously seen left suprahilar opacity is improving compared to prior. No new focal airspace consolidation. No large pleural fluid collection. No  pneumothorax. IMPRESSION: Improving left suprahilar opacity.  Otherwise stable chest. Electronically Signed   By: Davina Poke D.O.   On: 11/23/2021 09:15   DG CHEST PORT 1 VIEW  Result Date: 11/22/2021 CLINICAL DATA:  Status post thoracentesis. EXAM: PORTABLE CHEST 1 VIEW COMPARISON:  November 22, 2021. FINDINGS: Stable cardiomegaly. Status post coronary bypass graft. Left internal jugular and right-sided PICC line are unchanged in position. No pneumothorax is noted. No significant pleural effusion is noted. Mild left suprahilar opacity is noted concerning for atelectasis or possibly infiltrate. Bony thorax is unremarkable. IMPRESSION: Mild left suprahilar opacity is noted concerning for atelectasis or possibly infiltrate. Electronically Signed   By: Marijo Conception M.D.   On: 11/22/2021 14:01   DG CHEST PORT 1 VIEW  Result Date: 11/22/2021 CLINICAL DATA:  Pleural effusion. EXAM: PORTABLE CHEST 1 VIEW COMPARISON:  November 21, 2021. FINDINGS: Stable cardiomegaly. Right-sided PICC line and left internal jugular catheter unchanged in position. Right lung is clear. Stable left basilar opacity is noted suggesting atelectasis and probable associated pleural effusion. Bony thorax is unremarkable. IMPRESSION: Stable left basilar opacity is noted suggesting atelectasis and probable associated pleural effusion. Electronically Signed   By: Marijo Conception M.D.   On: 11/22/2021 07:59   DG CHEST PORT 1 VIEW  Result Date: 11/21/2021 CLINICAL DATA:  Shortness of breath chest pain. EXAM: PORTABLE CHEST 1 VIEW COMPARISON:  Chest radiograph performed earlier on the same day FINDINGS: The heart is enlarged. Evidence of prior coronary artery bypass grafting. Left IJ access catheter with distal tip in the SVC. Right access PICC with distal tip in the SVC. Mild elevation of the left hemidiaphragm with left basilar opacity which may represent small effusion/atelectasis, unchanged. IMPRESSION: Interval placement of left IJ access  central line. Stable cardiomegaly with left basilar opacity suggesting effusion/atelectasis, unchanged. Electronically Signed   By: Keane Police D.O.   On: 11/21/2021 16:32   DG CHEST PORT 1 VIEW  Result Date: 11/21/2021 CLINICAL DATA:  Pleural effusion EXAM: PORTABLE CHEST 1 VIEW COMPARISON:  11/20/2021 FINDINGS: Left IJ introducer has been removed. Right subclavian line is unchanged. Lung aeration is  similar with persistent left upper and lower lung opacities. Probable small left pleural effusion remains present. Stable cardiomegaly. IMPRESSION: Lines and tubes as above. Persistent left upper and lower lung opacities and probable small left pleural effusion. Electronically Signed   By: Macy Mis M.D.   On: 11/21/2021 08:06   DG Chest Port 1 View  Result Date: 11/20/2021 CLINICAL DATA:  Post CABG EXAM: PORTABLE CHEST 1 VIEW COMPARISON:  11/19/2021 FINDINGS: Left IJ introducer now overlies the distal left IJ. Right subclavian line is unchanged. Persistent left basilar and suprahilar opacities. Stable cardiomegaly. No pneumothorax. Small left effusion. IMPRESSION: No significant change. Persistent left basilar in suprahilar opacities and small left effusion. Electronically Signed   By: Macy Mis M.D.   On: 11/20/2021 08:13   DG CHEST PORT 1 VIEW  Result Date: 11/19/2021 CLINICAL DATA:  Central line placement EXAM: PORTABLE CHEST 1 VIEW COMPARISON:  11/19/2021 FINDINGS: Interval placement of right subclavian central venous catheter with the tip at the cavoatrial junction. No pneumothorax Pre-existing right jugular sheath unchanged in position in the right innominate vein. Left jugular central venous catheter tip in the proximal SVC also unchanged Postop CABG with cardiac enlargement. Asymmetric airspace disease left upper lobe and left lower lobe unchanged. Small left effusion. Right lung remains clear. IMPRESSION: Right subclavian central venous catheter tip at the cavoatrial junction. No  pneumothorax Asymmetric airspace disease on the left is unchanged. Electronically Signed   By: Franchot Gallo M.D.   On: 11/19/2021 11:39   DG CHEST PORT 1 VIEW  Result Date: 11/19/2021 CLINICAL DATA:  Pneumonia, history of CHF, CABG on 11/12/2021 EXAM: PORTABLE CHEST 1 VIEW COMPARISON:  Chest radiograph 11/18/2021 FINDINGS: The right IJ vascular sheath and left IJ vascular catheter are in stable position terminating in the region of the right brachiocephalic vein and confluence of the brachiocephalic veins, respectively. Median sternotomy wires are unchanged. The cardiac silhouette is enlarged, unchanged. The upper mediastinal contours are stable. Retrocardiac and patchy left suprahilar opacities are not significantly changed. There are probable trace bilateral pleural effusions. There is no overt pulmonary edema. There is no new or worsening focal airspace disease. Overall, aeration is not significantly changed. There is no pneumothorax. There is no acute osseous abnormality. IMPRESSION: Unchanged retrocardiac and left perihilar opacities and probable trace bilateral pleural effusions. Overall, no significant interval change since 11/18/2021. Electronically Signed   By: Valetta Mole M.D.   On: 11/19/2021 07:59   DG Chest Port 1 View  Result Date: 11/18/2021 CLINICAL DATA:  ETT EXAM: PORTABLE CHEST - 1 VIEW COMPARISON:  11/16/2021 FINDINGS: Unchanged cardiomegaly. Interval increase of left perihilar airspace opacity. No significant pulmonary vascular congestion. Postsurgical changes of CABG again seen. Left IJ central venous catheter unchanged in position terminating at the confluence of the brachiocephalic veins. Right IJ sheath terminates in the region of the right brachiocephalic vein. IMPRESSION: 1. Interval worsening of left perihilar airspace opacity suspicious for developing pneumonia. 2. Unchanged cardiomegaly. Electronically Signed   By: Miachel Roux M.D.   On: 11/18/2021 07:34   DG Chest Port 1  View  Result Date: 11/16/2021 CLINICAL DATA:  Status post CABG. EXAM: PORTABLE CHEST 1 VIEW COMPARISON:  11/15/2021 FINDINGS: Right IJ Cordis is stable with tip projecting over the proximal SVC. There is a left IJ catheter with tip projecting over the mid SVC. Postoperative changes from median sternotomy and CABG procedure. Stable cardiac enlargement with pulmonary vascular congestion. No pneumothorax or airspace consolidation. IMPRESSION: Stable cardiac enlargement and pulmonary vascular  congestion. Electronically Signed   By: Kerby Moors M.D.   On: 11/16/2021 09:54   DG Chest Port 1 View  Result Date: 11/15/2021 CLINICAL DATA:  Central line placement EXAM: PORTABLE CHEST 1 VIEW COMPARISON:  11/15/2021 FINDINGS: Single frontal view of the chest demonstrates interval removal of the enteric catheter. Right internal jugular Cordis is stable. Interval placement of a left internal jugular catheter tip overlying the superior vena cava. Postsurgical changes from prior CABG. Cardiac silhouette remains enlarged, with stable central vascular congestion. Lung volumes are diminished, without focal airspace disease, effusion, or pneumothorax. No acute bony abnormalities. IMPRESSION: 1. No complication after left internal jugular catheter placement. 2. Continued low lung volumes and central vascular congestion. Electronically Signed   By: Randa Ngo M.D.   On: 11/15/2021 16:28   DG CHEST PORT 1 VIEW  Result Date: 11/15/2021 CLINICAL DATA:  Pneumothorax. EXAM: PORTABLE CHEST 1 VIEW COMPARISON:  Nov 14, 2021. FINDINGS: Stable cardiomegaly. Status post coronary bypass graft. Nasogastric tube is seen entering stomach. Right internal jugular venous sheath is noted. Left-sided chest tube has been removed without pneumothorax. Bibasilar atelectasis is noted. Bony thorax is unremarkable. IMPRESSION: Left-sided chest tube has been removed without pneumothorax. Stable bibasilar atelectasis. Electronically Signed   By:  Marijo Conception M.D.   On: 11/15/2021 08:48   DG Chest Port 1 View  Result Date: 11/14/2021 CLINICAL DATA:  Status post coronary bypass surgery EXAM: PORTABLE CHEST 1 VIEW COMPARISON:  Previous studies including the examination of 11/13/2021 FINDINGS: Transverse diameter of heart is increased. Linear densities seen in the left lower lung fields. There are no signs of pulmonary edema. There is evidence of coronary bypass surgery. Left chest tube is noted. There is no pleural effusion or pneumothorax. Enteric tube is noted traversing the esophagus. There is interval removal of Swan-Ganz catheter. Right IJ vascular sheath is noted in place. IMPRESSION: Cardiomegaly. Linear densities in the left lower lung fields suggest subsegmental atelectasis. Electronically Signed   By: Elmer Picker M.D.   On: 11/14/2021 08:30   DG Chest Port 1 View  Result Date: 11/13/2021 CLINICAL DATA:  Status post CABG, dyspnea EXAM: PORTABLE CHEST 1 VIEW COMPARISON:  Chest radiograph from one day prior. FINDINGS: Interval extubation and removal of enteric tube. Stable right internal jugular Swan-Ganz catheter terminating over the main pulmonary artery. Intact sternotomy wires. Stable mid to upper left chest tube. Stable cardiomediastinal silhouette with mild to moderate cardiomegaly. No pneumothorax. No pleural effusion. Cephalization of the pulmonary vasculature without overt pulmonary edema. Mild left retrocardiac atelectasis, similar. IMPRESSION: 1. No pneumothorax. 2. Stable mild to moderate cardiomegaly without overt pulmonary edema. 3. Stable mild left retrocardiac atelectasis. Electronically Signed   By: Ilona Sorrel M.D.   On: 11/13/2021 08:09   DG Chest Port 1 View  Result Date: 11/12/2021 CLINICAL DATA:  Status post CABG. EXAM: PORTABLE CHEST 1 VIEW COMPARISON:  Nov 08, 2021 FINDINGS: Surgical changes of median sternotomy and CABG. Right IJ Swan-Ganz catheter with tip projecting over the main pulmonary artery.  Endotracheal tube with tip projecting over the midthoracic trachea. Enteric tube courses below the diaphragm with tip obscured by collimation. Mild cardiac enlargement at least somewhat accentuated by technique. Low lung volumes with dependent atelectasis. No visible pleural effusion or pneumothorax. IMPRESSION: 1. Postsurgical change of median sternotomy and CABG with low lung volumes and dependent atelectasis. 2. Mild cardiac enlargement. No overt pulmonary edema or pleural effusions. Electronically Signed   By: Dahlia Bailiff M.D.   On:  11/12/2021 15:16   DG Abd 2 Views  Result Date: 11/15/2021 CLINICAL DATA:  Ileus, abdominal distension EXAM: ABDOMEN - 2 VIEW COMPARISON:  Radiograph 11/14/2021 FINDINGS: Nasogastric tube tip and side port overlie the stomach. Slight improvement in gaseous distension of bowel small measures up to 3.0 cm, previously up to 3.5 cm. No evidence of free intraperitoneal gas on upright x-ray. IMPRESSION: Slight improvement in bowel distention in comparison to prior exam. Electronically Signed   By: Maurine Simmering M.D.   On: 11/15/2021 10:43   DG Abd Portable 1V  Result Date: 11/14/2021 CLINICAL DATA:  Abdominal distention, status post coronary artery bypass surgery EXAM: PORTABLE ABDOMEN - 1 VIEW COMPARISON:  11/13/2021 FINDINGS: Tip of enteric tube is seen in the antrum of the stomach. There is mild dilation of small-bowel loops measuring up to 3.5 cm in diameter. Gas and stool are noted in the colon. Stomach is not distended. IMPRESSION: Tip of enteric tube is seen in the antrum of the stomach. Mild dilation of small-bowel loops may suggest ileus. Electronically Signed   By: Elmer Picker M.D.   On: 11/14/2021 08:36   DG Abd Portable 1V  Result Date: 11/13/2021 CLINICAL DATA:  Ileus EXAM: PORTABLE ABDOMEN - 1 VIEW COMPARISON:  None Available. FINDINGS: Gas in the stomach, small bowel, and colon. Mild bowel distension. No air in the rectum. Small amount of stool in the  colon. Negative skeletal structures. IMPRESSION: Mild bowel distension consistent with ileus. Electronically Signed   By: Franchot Gallo M.D.   On: 11/13/2021 17:30   ECHOCARDIOGRAM COMPLETE  Result Date: 11/13/2021    ECHOCARDIOGRAM REPORT   Patient Name:   [name] [name] Date of Exam: 11/13/2021 Medical Rec #:  [id number]    Height:       70.0 in Accession #:    [id number]   Weight:       295.6 lb Date of Birth:  10/18/1955   BSA:          2.464 m Patient Age:    13 years     BP:           126/62 mmHg Patient Gender: M            HR:           87 bpm. Exam Location:  Inpatient Procedure: 2D Echo, Cardiac Doppler, Color Doppler and Intracardiac            Opacification Agent Indications:    CHF  History:        Patient has prior history of Echocardiogram examinations, most                 recent 07/30/2021. CHF, Arrythmias:Bradycardia; Risk                 Factors:Hypertension, Dyslipidemia and Diabetes. S/p CABG x 3.  Sonographer:    Luisa Hart RDCS Referring Phys: 34 LINDSAY NICOLE [facility]  Sonographer Comments: Patient is morbidly obese and Technically difficult study due to poor echo windows. Image acquisition challenging due to patient body habitus. IMPRESSIONS  1. Left ventricular ejection fraction, by estimation, is 55 to 60%. The left ventricle has normal function. Left ventricular endocardial border not optimally defined to evaluate regional wall motion. Left ventricular diastolic parameters are consistent with Grade II diastolic dysfunction (pseudonormalization).  2. Peak RV-RA gradient 28 mmHg. Right ventricular systolic function was not well visualized. The right ventricular size is not well visualized.  3. The mitral valve was not well  visualized. No evidence of mitral valve regurgitation. No evidence of mitral stenosis.  4. The aortic valve is tricuspid. There is mild calcification of the aortic valve. Aortic valve regurgitation is not visualized. Aortic valve sclerosis is present, with no evidence  of aortic valve stenosis.  5. No significant pericardial effusion.  6. The heart is poorly visualized. Grossly, LV systolic function appears to be in the normal range. FINDINGS  Left Ventricle: Left ventricular ejection fraction, by estimation, is 55 to 60%. The left ventricle has normal function. Left ventricular endocardial border not optimally defined to evaluate regional wall motion. The left ventricular internal cavity size was normal in size. There is no left ventricular hypertrophy. Left ventricular diastolic parameters are consistent with Grade II diastolic dysfunction (pseudonormalization). Right Ventricle: Peak RV-RA gradient 28 mmHg. The right ventricular size is not well visualized. Right vetricular wall thickness was not well visualized. Right ventricular systolic function was not well visualized. Left Atrium: Left atrial size was not well visualized. Right Atrium: Right atrial size was not well visualized. Pericardium: There is no evidence of pericardial effusion. Mitral Valve: The mitral valve was not well visualized. No evidence of mitral valve regurgitation. No evidence of mitral valve stenosis. MV peak gradient, 9.1 mmHg. The mean mitral valve gradient is 3.0 mmHg. Tricuspid Valve: The tricuspid valve is normal in structure. Tricuspid valve regurgitation is trivial. Aortic Valve: The aortic valve is tricuspid. There is mild calcification of the aortic valve. Aortic valve regurgitation is not visualized. Aortic valve sclerosis is present, with no evidence of aortic valve stenosis. Aortic valve mean gradient measures 2.5 mmHg. Aortic valve peak gradient measures 4.8 mmHg. Aortic valve area, by VTI measures 6.12 cm. Pulmonic Valve: The pulmonic valve was normal in structure. Pulmonic valve regurgitation is not visualized. Aorta: The aortic root is normal in size and structure. Venous: The inferior vena cava was not well visualized. IAS/Shunts: The interatrial septum was not well visualized.  LEFT  VENTRICLE PLAX 2D LVIDd:         5.10 cm   Diastology LVIDs:         4.00 cm   LV e' medial:    5.92 cm/s LV PW:         1.20 cm   LV E/e' medial:  27.2 LV IVS:        1.10 cm   LV e' lateral:   7.22 cm/s LVOT diam:     2.60 cm   LV E/e' lateral: 22.3 LV SV:         102 LV SV Index:   42 LVOT Area:     5.31 cm  RIGHT VENTRICLE RV S prime:     10.10 cm/s LEFT ATRIUM         Index LA diam:    2.70 cm 1.10 cm/m  AORTIC VALVE                    PULMONIC VALVE AV Area (Vmax):    5.36 cm     PV Vmax:       1.02 m/s AV Area (Vmean):   5.17 cm     PV Vmean:      67.500 cm/s AV Area (VTI):     6.12 cm     PV VTI:        0.144 m AV Vmax:           109.00 cm/s  PV Peak grad:  4.2 mmHg AV Vmean:  74.650 cm/s  PV Mean grad:  2.0 mmHg AV VTI:            0.168 m AV Peak Grad:      4.8 mmHg AV Mean Grad:      2.5 mmHg LVOT Vmax:         110.00 cm/s LVOT Vmean:        72.700 cm/s LVOT VTI:          0.193 m LVOT/AV VTI ratio: 1.15  AORTA Ao Root diam: 3.70 cm Ao Asc diam:  3.60 cm MITRAL VALVE                TRICUSPID VALVE MV Area (PHT): 3.91 cm     TR Peak grad:   28.5 mmHg MV Area VTI:   2.44 cm     TR Vmax:        267.00 cm/s MV Peak grad:  9.1 mmHg MV Mean grad:  3.0 mmHg     SHUNTS MV Vmax:       1.51 m/s     Systemic VTI:  0.19 m MV Vmean:      80.3 cm/s    Systemic Diam: 2.60 cm MV Decel Time: 194 msec MV E velocity: 161.00 cm/s MV A velocity: 52.60 cm/s MV E/A ratio:  3.06 Dalton McleanMD Electronically signed by Franki Monte Signature Date/Time: 11/13/2021/3:42:40 PM    Final    ECHO TEE  Result Date: 11/21/2021    TRANSESOPHOGEAL ECHO REPORT   Patient Name:   [name] [name] Date of Exam: 11/21/2021 Medical Rec #:  [id number]    Height:       70.0 in Accession #:    [id number]   Weight:       257.6 lb Date of Birth:  [birth year]   BSA:          2.324 m Patient Age:    75 years     BP:           97/60 mmHg Patient Gender: M            HR:           83 bpm. Exam Location:  Inpatient Procedure: Transesophageal  Echo, Color Doppler and Saline Contrast Bubble Study Indications:     I48.91* Unspeicified atrial fibrillation  History:         Patient has prior history of Echocardiogram examinations, most                  recent 11/13/2021. CHF, CAD, Arrythmias:Bradycardia and Atrial                  Fibrillation; Risk Factors:Dyslipidemia, Hypertension and                  Diabetes.  Sonographer:     Bernadene Person RDCS Referring Phys:  Bertram Diagnosing Phys: Franki Monte PROCEDURE: After discussion of the risks and benefits of a TEE, an informed consent was obtained from the patient. The transesophogeal probe was passed without difficulty through the esophogus of the patient. Sedation performed by different physician. The patient was monitored while under deep sedation. Anesthestetic sedation was provided intravenously by Anesthesiology: 56.28mg  of Propofol. The patient's vital signs; including heart rate, blood pressure, and oxygen saturation; remained stable throughout the procedure. The patient developed no complications during the procedure. A successful direct current cardioversion was performed at 200 joules with 1 attempt. IMPRESSIONS  1. Left ventricular ejection fraction, by estimation,  is 55 to 60%. The left ventricle has normal function. The left ventricle has no regional wall motion abnormalities but there is septal bounce suggestive of prior cardiac surgery.  2. Right ventricular systolic function is mildly reduced. The right ventricular size is normal. Tricuspid regurgitation signal is inadequate for assessing PA pressure.  3. Left atrial size was mildly dilated. No left atrial/left atrial appendage thrombus was detected.  4. There is a PFO by color doppler and bubble study.  5. The mitral valve is normal in structure. Mild mitral valve regurgitation. No evidence of mitral stenosis.  6. The aortic valve is tricuspid. There is mild calcification of the aortic valve. Aortic valve regurgitation  is not visualized. No aortic stenosis is present.  7. A small pericardial effusion is present, primarily towards the apex. FINDINGS  Left Ventricle: Left ventricular ejection fraction, by estimation, is 55 to 60%. The left ventricle has normal function. The left ventricle has no regional wall motion abnormalities. The left ventricular internal cavity size was normal in size. There is  no left ventricular hypertrophy. Right Ventricle: The right ventricular size is normal. No increase in right ventricular wall thickness. Right ventricular systolic function is mildly reduced. Tricuspid regurgitation signal is inadequate for assessing PA pressure. Left Atrium: Left atrial size was mildly dilated. No left atrial/left atrial appendage thrombus was detected. Right Atrium: Right atrial size was normal in size. Pericardium: A small pericardial effusion is present. Mitral Valve: The mitral valve is normal in structure. Mild mitral valve regurgitation. No evidence of mitral valve stenosis. Tricuspid Valve: The tricuspid valve is normal in structure. Tricuspid valve regurgitation is trivial. Aortic Valve: The aortic valve is tricuspid. There is mild calcification of the aortic valve. Aortic valve regurgitation is not visualized. No aortic stenosis is present. Pulmonic Valve: The pulmonic valve was normal in structure. Pulmonic valve regurgitation is not visualized. Aorta: The aortic root is normal in size and structure. IAS/Shunts: There is a PFO by color doppler and bubble study. Agitated saline contrast was given intravenously to evaluate for intracardiac shunting. [employer] Electronically signed by Franki Monte Signature Date/Time: 11/21/2021/3:32:50 PM    Final    ECHO INTRAOPERATIVE TEE  Result Date: 11/14/2021  *INTRAOPERATIVE TRANSESOPHAGEAL REPORT *  Patient Name:   [name] [name]   Date of Exam: 11/12/2021 Medical Rec #:  [id number]      Height:       70.0 in Accession #:    [id number]     Weight:       280.2 lb  Date of Birth:  August 07, 1955     BSA:          2.41 m Patient Age:    22 years       BP:           125/72 mmHg Patient Gender: M              HR:           112 bpm. Exam Location:  Anesthesiology Transesophogeal exam was perform intraoperatively during surgical procedure. Patient was closely monitored under general anesthesia during the entirety of examination. Indications:     Coronary Artery Disease Sonographer:     Bernadene Person RDCS Performing Phys: [place] VANTRIGT Diagnosing Phys: Albertha Ghee MD Complications: No known complications during this procedure. POST-OP IMPRESSIONS Overall, there were no significant changes from pre-bypass. PRE-OP FINDINGS  Left Ventricle: The left ventricle has mildly reduced systolic function, with an ejection fraction of 45-50%. The cavity size  was normal. There is no increase in left ventricular wall thickness. Left ventrical global hypokinesis without regional wall motion abnormalities. There is no left ventricular hypertrophy. Right Ventricle: The right ventricle has normal systolic function. The cavity was normal. There is no increase in right ventricular wall thickness. Left Atrium: Left atrial size was normal in size. No left atrial/left atrial appendage thrombus was detected. Right Atrium: Right atrial size was normal in size. Interatrial Septum: No atrial level shunt detected by color flow Doppler. Pericardium: There is no evidence of pericardial effusion. Mitral Valve: The mitral valve is normal in structure. Mitral valve regurgitation is mild by color flow Doppler. There is No evidence of mitral stenosis. Tricuspid Valve: The tricuspid valve was normal in structure. Tricuspid valve regurgitation is mild by color flow Doppler. No evidence of tricuspid stenosis is present. Aortic Valve: The aortic valve is tricuspid Aortic valve regurgitation was not visualized by color flow Doppler. There is no stenosis of the aortic valve. Calcification is present on the aortic  valve with no stenosis. Pulmonic Valve: The pulmonic valve was normal in structure. Pulmonic valve regurgitation is trivial by color flow Doppler. Aorta: The aortic root, ascending aorta and aortic arch are normal in size and structure. +-------------+-----------++ AORTIC VALVE             +-------------+-----------++ AV Vmax:     124.00 cm/s +-------------+-----------++ AV Vmean:    96.300 cm/s +-------------+-----------++ AV VTI:      0.256 m     +-------------+-----------++ AV Peak Grad:6.2 mmHg    +-------------+-----------++ AV Mean Grad:4.0 mmHg    +-------------+-----------++  Albertha Ghee MD Electronically signed by Albertha Ghee MD Signature Date/Time: 11/14/2021/10:12:47 AM    Final    VAS US DOPPLER PRE CABG  Result Date: 11/08/2021 PREOPERATIVE VASCULAR EVALUATION Patient Name:  [name] [name]  Date of Exam:   11/08/2021 Medical Rec #: [id number]     Accession #:    [id number] Date of Birth: [birth year]    Patient Gender: M Patient Age:   67 years Exam Location:  [facility] Procedure:      VAS US DOPPLER PRE CABG Referring Phys: Collier [place] VANTRIGT --------------------------------------------------------------------------------  Indications:      Pre-CABG. Risk Factors:     Hypertension, hyperlipidemia, Diabetes. Limitations:      A-fib Comparison Study: No prior study Performing Technologist: Maudry Mayhew MHA, RVT, RDCS, RDMS  Examination Guidelines: A complete evaluation includes B-mode imaging, spectral Doppler, color Doppler, and power Doppler as needed of all accessible portions of each vessel. Bilateral testing is considered an integral part of a complete examination. Limited examinations for reoccurring indications may be performed as noted.  Right Carotid Findings: +---------+--------+-------+--------+---------------------------------+--------+          PSV cm/sEDV    StenosisDescribe                         Comments                  cm/s                                                      +---------+--------+-------+--------+---------------------------------+--------+ CCA Prox 88      14             smooth and homogeneous                    +---------+--------+-------+--------+---------------------------------+--------+  CCA      83      13                                                       Distal                                                                    +---------+--------+-------+--------+---------------------------------+--------+ ICA Prox 114     19             heterogenous, calcific and                                                irregular                                 +---------+--------+-------+--------+---------------------------------+--------+ ICA      68      20                                                       Distal                                                                    +---------+--------+-------+--------+---------------------------------+--------+ ECA      93                                                               +---------+--------+-------+--------+---------------------------------+--------+ +----------+--------+-------+----------------+------------+           PSV cm/sEDV cmsDescribe        Arm Pressure +----------+--------+-------+----------------+------------+ [id number]            Multiphasic, WNL             +----------+--------+-------+----------------+------------+ +---------+--------+--+--------+-+---------+ VertebralPSV cm/s38EDV cm/s9Antegrade +---------+--------+--+--------+-+---------+ Left Carotid Findings: +----------+--------+--------+--------+-------------------------------+--------+           PSV cm/sEDV cm/sStenosisDescribe                       Comments +----------+--------+--------+--------+-------------------------------+--------+ CCA Prox  119     19                                                       +----------+--------+--------+--------+-------------------------------+--------+  CCA Mid   128     21                                                      +----------+--------+--------+--------+-------------------------------+--------+ CCA Distal104     13              smooth and heterogenous                 +----------+--------+--------+--------+-------------------------------+--------+ ICA Prox  73      13              heterogenous, irregular and                                               hyperechoic                             +----------+--------+--------+--------+-------------------------------+--------+ ICA Distal72      22                                                      +----------+--------+--------+--------+-------------------------------+--------+ ECA       85      8                                                       +----------+--------+--------+--------+-------------------------------+--------+ +----------+--------+--------+----------------+------------+ SubclavianPSV cm/sEDV cm/sDescribe        Arm Pressure +----------+--------+--------+----------------+------------+           158             Multiphasic, WNL             +----------+--------+--------+----------------+------------+ +---------+--------+--+--------+--+---------+ VertebralPSV cm/s41EDV cm/s13Antegrade +---------+--------+--+--------+--+---------+  ABI Findings: +---------+------------------+-----+----------+--------+ Right    Rt Pressure (mmHg)IndexWaveform  Comment  +---------+------------------+-----+----------+--------+ Brachial 97                     triphasic          +---------+------------------+-----+----------+--------+ PTA      111               0.89 monophasic         +---------+------------------+-----+----------+--------+ DP       151               1.21 monophasic         +---------+------------------+-----+----------+--------+  Great Toe80                0.64                    +---------+------------------+-----+----------+--------+ +---------+------------------+-----+----------+-------+ Left     Lt Pressure (mmHg)IndexWaveform  Comment +---------+------------------+-----+----------+-------+ Brachial 125                    triphasic         +---------+------------------+-----+----------+-------+ PTA      175  1.40 monophasic        +---------+------------------+-----+----------+-------+ DP       128               1.02 monophasic        +---------+------------------+-----+----------+-------+ Great Toe58                0.46                   +---------+------------------+-----+----------+-------+ +-------+---------------+----------------+ ABI/TBIToday's ABI/TBIPrevious ABI/TBI +-------+---------------+----------------+ Right  1.21/0.64                       +-------+---------------+----------------+ Left   1.40/0.46                       +-------+---------------+----------------+  Right Doppler Findings: +-----------+--------+-----+---------+-----------------------------------------+ Site       PressureIndexDoppler  Comments                                  +-----------+--------+-----+---------+-----------------------------------------+ Brachial   97           triphasic                                          +-----------+--------+-----+---------+-----------------------------------------+ Radial                  triphasic                                          +-----------+--------+-----+---------+-----------------------------------------+ Ulnar                   triphasic                                          +-----------+--------+-----+---------+-----------------------------------------+ Palmar Arch                      Signal obliterates with radial                                             compression, is unaffected with ulnar                                       compression.                              +-----------+--------+-----+---------+-----------------------------------------+  Left Doppler Findings: +-----------+--------+-----+---------+-----------------------------------------+ Site       PressureIndexDoppler  Comments                                  +-----------+--------+-----+---------+-----------------------------------------+ Brachial   125          triphasic                                          +-----------+--------+-----+---------+-----------------------------------------+  Radial                  triphasic                                          +-----------+--------+-----+---------+-----------------------------------------+ Ulnar                   triphasic                                          +-----------+--------+-----+---------+-----------------------------------------+ Palmar Arch                      Signal obliterates with radial                                             compression, decreases >50% with ulnar                                     compression                               +-----------+--------+-----+---------+-----------------------------------------+  Summary: Right Carotid: Velocities in the right ICA are consistent with a 1-39% stenosis. Left Carotid: Velocities in the left ICA are consistent with a 1-39% stenosis. Vertebrals:  Bilateral vertebral arteries demonstrate antegrade flow. Subclavians: Normal flow hemodynamics were seen in bilateral subclavian              arteries. Right ABI: Resting right ankle-brachial index is within normal limits, however give abnormal waveforms this is likely falsely elevated, suggestive of medial arterial calcification of the right lower extremity arteries. The right toe-brachial index is abnormal. Left ABI: Resting left ankle-brachial index indicates noncompressible left lower extremity arteries, suggestive of  medial arterial calcification. The left toe-brachial index is abnormal. Right Upper Extremity: Doppler waveform obliterate with right radial compression. Doppler waveforms remain within normal limits with right ulnar compression. Left Upper Extremity: Doppler waveform obliterate with left radial compression. Doppler waveforms decrease >50% with left ulnar compression.  Electronically signed by Jamelle Haring on 11/08/2021 at 5:26:46 PM.    Final    VAS [place] LOWER EXTREMITY VENOUS (DVT)  Result Date: 11/13/2021  Lower Venous DVT Study Patient Name:  [name] [name]  Date of Exam:   11/13/2021 Medical Rec #: [id number]     Accession #:    [id number] Date of Birth: 04/17/1956    Patient Gender: M Patient Age:   60 years Exam Location:  [facility] Procedure:      VAS [place] LOWER EXTREMITY VENOUS (DVT) Referring Phys: Ina Homes --------------------------------------------------------------------------------  Indications: Edema.  Risk Factors: Surgery S/P CABGx3 on 11/12/21. Limitations: Body habitus, poor ultrasound/tissue interface and bandages. Comparison Study: No previous exams Performing Technologist: Jody Hill RVT, RDMS  Examination Guidelines: A complete evaluation includes B-mode imaging, spectral Doppler, color Doppler, and power Doppler as needed of all accessible portions of each vessel. Bilateral testing is considered an integral part of a complete examination. Limited examinations for reoccurring indications may be performed as noted. The reflux  portion of the exam is performed with the patient in reverse Trendelenburg.  +--------+---------------+---------+-----------+----------+--------------------+ RIGHT   CompressibilityPhasicitySpontaneityPropertiesThrombus Aging       +--------+---------------+---------+-----------+----------+--------------------+ CFV                    Yes      Yes                  patent by                                                                  color/doppler        +--------+---------------+---------+-----------+----------+--------------------+ SFJ                                                  patent by color      +--------+---------------+---------+-----------+----------+--------------------+ FV Prox Full           Yes      Yes                                       +--------+---------------+---------+-----------+----------+--------------------+ FV Mid  Full           Yes      Yes                                       +--------+---------------+---------+-----------+----------+--------------------+ FV      Full           Yes      Yes                                       Distal                                                                    +--------+---------------+---------+-----------+----------+--------------------+ PFV     Full                                                              +--------+---------------+---------+-----------+----------+--------------------+ POP     Full           Yes      Yes                                       +--------+---------------+---------+-----------+----------+--------------------+ PTV     Full                                                              +--------+---------------+---------+-----------+----------+--------------------+  PERO    Full                                                              +--------+---------------+---------+-----------+----------+--------------------+   Right Technical Findings: Very limited visualization of CFV & SFJ due to habitus. Limited visualzation of calf vessels due to bandage from vein harvest.  +--------+---------------+---------+-----------+----------+--------------------+ LEFT    CompressibilityPhasicitySpontaneityPropertiesThrombus Aging       +--------+---------------+---------+-----------+----------+--------------------+ CFV                                                  Not  visualized       +--------+---------------+---------+-----------+----------+--------------------+ SFJ                                                  Not visualized       +--------+---------------+---------+-----------+----------+--------------------+ FV Prox Full           Yes      Yes                                       +--------+---------------+---------+-----------+----------+--------------------+ FV Mid  Full           Yes      Yes                                       +--------+---------------+---------+-----------+----------+--------------------+ FV      Full           Yes      Yes                                       Distal                                                                    +--------+---------------+---------+-----------+----------+--------------------+ PFV                    Yes      Yes                  patent by                                                                 color/doppler        +--------+---------------+---------+-----------+----------+--------------------+ POP  Full           Yes      Yes                                       +--------+---------------+---------+-----------+----------+--------------------+ PTV     Full                                                              +--------+---------------+---------+-----------+----------+--------------------+ PERO    Full                                                              +--------+---------------+---------+-----------+----------+--------------------+   Left Technical Findings: Not visualized segments include CFV and SFJ (bandage in groin area).   Summary: BILATERAL: -No evidence of popliteal cyst, bilaterally. RIGHT: - There is no evidence of deep vein thrombosis in the lower extremity. However, portions of this examination were limited- see technologist comments above.  LEFT: - There is no evidence of deep vein thrombosis in the lower  extremity. However, portions of this examination were limited- see technologist comments above.  *See table(s) above for measurements and observations. Electronically signed by Harold Barban MD on 11/13/2021 at 8:53:17 PM.    Final    CT CHEST ABDOMEN PELVIS WO CONTRAST  Result Date: 11/25/2021 CLINICAL DATA:  Sepsis, elevated WBC. EXAM: CT CHEST, ABDOMEN AND PELVIS WITHOUT CONTRAST TECHNIQUE: Multidetector CT imaging of the chest, abdomen and pelvis was performed following the standard protocol without IV contrast. RADIATION DOSE REDUCTION: This exam was performed according to the departmental dose-optimization program which includes automated exposure control, adjustment of the mA and/or kV according to patient size and/or use of iterative reconstruction technique. COMPARISON:  None Available. FINDINGS: CT CHEST FINDINGS Cardiovascular: Heart is normal in size and there is a hyperdense pericardial effusion measuring up to 1.3 cm in thickness. Three-vessel coronary artery calcifications are noted. There is atherosclerotic calcification of the aorta without evidence of aneurysm. Pulmonary trunk is mildly distended which may be associated with underlying pulmonary artery hypertension. The distal tip of a central venous catheter terminates in the superior vena cava. Mediastinum/Nodes: No mediastinal or axillary lymphadenopathy by size criteria. Evaluation of the hila is limited due to lack of IV contrast. The thyroid gland and esophagus are within normal limits. A small amount of debris is noted in the trachea. Lungs/Pleura: Mild atelectasis or infiltrate is noted in the lower lobes bilaterally. There is a small pleural effusion on the right and a moderate loculated pleural effusion on the left. No pneumothorax. A few scattered pulmonary nodules are noted on the right measuring up to 3 mm, axial image 65. Musculoskeletal: Sternotomy wires are noted in the midline. No acute or suspicious osseous abnormality. CT  ABDOMEN PELVIS FINDINGS Hepatobiliary: Subcentimeter hypodensity is present in the anterior right lobe of the liver which is too small to further characterize. The gallbladder is without stones. No biliary ductal dilatation. Pancreas: Unremarkable. No pancreatic ductal dilatation or surrounding inflammatory changes. Spleen: Normal in size without focal  abnormality. Adrenals/Urinary Tract: The adrenal glands are within normal limits. A nonobstructive calculus is noted on the left. No ureteral calculus or obstructive uropathy bilaterally. The bladder is unremarkable. Stomach/Bowel: Stomach is within normal limits. Appendix appears normal. No evidence of bowel wall thickening, distention, or inflammatory changes. No free air or pneumatosis. A moderate amount of retained stool is noted in the colon. Vascular/Lymphatic: Aortic atherosclerosis. No enlarged abdominal or pelvic lymph nodes. Reproductive: Prostate is unremarkable. Other: Fat containing inguinal hernias bilaterally. Small fat containing umbilical hernia. Musculoskeletal: There is asymmetric enlargement of the psoas muscle on the left with a hypodense region with a hyperdense rim measuring 8.3 x 4.2 x 3.5 cm. There is surrounding hyperdense material in the retroperitoneum on the left. Degenerative changes are noted in the lumbar spine. No acute osseous abnormality. IMPRESSION: 1. Asymmetric enlargement of the psoas muscle on the left containing a hypodense region with surrounding hyperdense material within the muscle and in the retroperitoneum on the left, possible hemorrhage versus abscess. 2. Atelectasis or infiltrate in the lower lobes bilaterally. 3. Small right pleural effusion and moderate loculated pleural effusion on the left. 4. Small hyperdense pericardial effusion, possibly reflecting blood products versus infection. 5. A few scattered pulmonary nodules bilaterally measuring up to 3 mm. No follow-up needed if patient is low-risk (and has no known or  suspected primary neoplasm). Non-contrast chest CT can be considered in 12 months if patient is high-risk. This recommendation follows the consensus statement: Guidelines for Management of Incidental Pulmonary Nodules Detected on CT Images: From the Fleischner Society 2017; Radiology 2017; 284:228-243. 6. Aortic atherosclerosis. 7. Nonobstructive left renal calculus. Electronically Signed   By: Brett Fairy M.D.   On: 11/25/2021 21:55    Labs:  CBC: Recent Labs    11/25/21 0358 11/26/21 0436 11/26/21 1513 11/27/21 0145  WBC 31.5* 25.2*  25.4* 25.2* 24.5*  HGB 7.4* 7.6*  7.6* 8.5* 8.7*  HCT 23.1* 24.0*  23.8* 26.2* 27.7*  PLT 310 293  287 293 309    COAGS: Recent Labs    11/08/21 1047 11/12/21 1504 11/25/21 0358 11/27/21 0145  INR 0.9 1.4* 1.4* 1.3*  APTT 26 27  --   --     BMP: Recent Labs    11/25/21 0358 11/25/21 2210 11/26/21 0436 11/27/21 0145  NA 130* 127* 130* 131*  K 4.6 3.6 4.1 4.3  CL 94* 88* 91* 93*  CO2 21* 22 23 20*  GLUCOSE 187* 201* 241* 243*  BUN 52* 31* 35* 52*  CALCIUM 8.5* 7.9* 8.2* 8.4*  CREATININE 6.09* 4.01* 4.92* 6.66*  GFRNONAA 9* 16* 12* 8*    LIVER FUNCTION TESTS: Recent Labs    11/15/21 0359 11/15/21 1547 11/16/21 0418 11/16/21 1600 11/17/21 0436 11/17/21 1600 11/18/21 0408 11/18/21 1628 11/22/21 2239 11/23/21 0434 11/25/21 0358 11/25/21 2210 11/26/21 0436 11/27/21 0145  BILITOT 0.6  --  0.9  --  0.8  --  0.8  --   --   --   --   --   --   --   AST 24  --  29  --  36  --  26  --   --   --   --   --   --   --   ALT 7  --  8  --  8  --  9  --   --   --   --   --   --   --   ALKPHOS 39  --  46  --  48  --  51  --   --   --   --   --   --   --   PROT 6.2*  --  6.3*  --  6.1*  --  6.5  --  6.6  --   --   --   --   --   ALBUMIN 3.5   < > 3.3*  3.4*   < > 3.1*  3.1*   < > 2.9*  3.0*   < >  --    < > 2.9* 2.8* 2.5* 2.6*   < > = values in this interval not displayed.    TUMOR MARKERS: No results for input(s): AFPTM,  CEA, CA199, CHROMGRNA in the last 8760 hours.  Assessment and Plan:  66 y/o M admitted 11/12/21 for planned CABG x3 with Maze procedure, post op course complicated by shock and AKI on CKD requiring CRRT found to have persistent leukocytosis with CT 6/5 showing left psaos fluid collection (abscess vs hematoma). IR has been consulted for left psoas aspiration/possible drain placement -- patient history and imaging reviewed by Dr. Serafina Royals who approves procedure.  Risks and benefits discussed with the patient including bleeding, infection, damage to adjacent structures, bowel perforation/fistula connection, and sepsis.  All of the patient's questions were answered, patient is agreeable to proceed.  Consent signed and in chart. Thank you for this interesting consult.  I greatly enjoyed meeting [name] [name] and look forward to participating in their care.  A copy of this report was sent to the requesting provider on this date.  Electronically Signed: Joaquim Nam, PA-C 11/27/2021, 9:34 AM   I spent a total of 40 Minutes in face to face in clinical consultation, greater than 50% of which was counseling/coordinating care for left psoas fluid collection.

## 2021-11-28 ENCOUNTER — Inpatient Hospital Stay (HOSPITAL_COMMUNITY): Payer: Medicare HMO

## 2021-11-28 ENCOUNTER — Other Ambulatory Visit: Payer: Self-pay

## 2021-11-28 DIAGNOSIS — D72829 Elevated white blood cell count, unspecified: Secondary | ICD-10-CM | POA: Diagnosis not present

## 2021-11-28 DIAGNOSIS — I480 Paroxysmal atrial fibrillation: Secondary | ICD-10-CM | POA: Diagnosis not present

## 2021-11-28 DIAGNOSIS — Z951 Presence of aortocoronary bypass graft: Secondary | ICD-10-CM | POA: Diagnosis not present

## 2021-11-28 HISTORY — PX: IR US GUIDE VASC ACCESS RIGHT: IMG2390

## 2021-11-28 HISTORY — PX: IR FLUORO GUIDE CV LINE RIGHT: IMG2283

## 2021-11-28 LAB — RENAL FUNCTION PANEL
Albumin: 2.2 g/dL — ABNORMAL LOW (ref 3.5–5.0)
Anion gap: 12 (ref 5–15)
BUN: 39 mg/dL — ABNORMAL HIGH (ref 8–23)
CO2: 25 mmol/L (ref 22–32)
Calcium: 8 mg/dL — ABNORMAL LOW (ref 8.9–10.3)
Chloride: 93 mmol/L — ABNORMAL LOW (ref 98–111)
Creatinine, Ser: 5.35 mg/dL — ABNORMAL HIGH (ref 0.61–1.24)
GFR, Estimated: 11 mL/min — ABNORMAL LOW (ref >60)
Glucose, Bld: 235 mg/dL — ABNORMAL HIGH (ref 70–99)
Phosphorus: 5.8 mg/dL — ABNORMAL HIGH (ref 2.5–4.6)
Potassium: 3.9 mmol/L (ref 3.5–5.1)
Sodium: 130 mmol/L — ABNORMAL LOW (ref 135–145)

## 2021-11-28 LAB — CBC
HCT: 25.4 % — ABNORMAL LOW (ref 39.0–52.0)
Hemoglobin: 8.1 g/dL — ABNORMAL LOW (ref 13.0–17.0)
MCH: 29.6 pg (ref 26.0–34.0)
MCHC: 31.9 g/dL (ref 30.0–36.0)
MCV: 92.7 fL (ref 80.0–100.0)
Platelets: 316 10*3/uL (ref 150–400)
RBC: 2.74 MIL/uL — ABNORMAL LOW (ref 4.22–5.81)
RDW: 15.9 % — ABNORMAL HIGH (ref 11.5–15.5)
WBC: 17.5 10*3/uL — ABNORMAL HIGH (ref 4.0–10.5)
nRBC: 0 % (ref 0.0–0.2)

## 2021-11-28 LAB — HEPARIN LEVEL (UNFRACTIONATED)
Heparin Unfractionated: <0.1 IU/mL — ABNORMAL LOW (ref 0.30–0.70)
Heparin Unfractionated: 0.22 IU/mL — ABNORMAL LOW (ref 0.30–0.70)

## 2021-11-28 LAB — MAGNESIUM: Magnesium: 1.8 mg/dL (ref 1.7–2.4)

## 2021-11-28 LAB — GLUCOSE, CAPILLARY
Glucose-Capillary: 220 mg/dL — ABNORMAL HIGH (ref 70–99)
Glucose-Capillary: 241 mg/dL — ABNORMAL HIGH (ref 70–99)
Glucose-Capillary: 247 mg/dL — ABNORMAL HIGH (ref 70–99)
Glucose-Capillary: 276 mg/dL — ABNORMAL HIGH (ref 70–99)
Glucose-Capillary: 312 mg/dL — ABNORMAL HIGH (ref 70–99)
Glucose-Capillary: 320 mg/dL — ABNORMAL HIGH (ref 70–99)

## 2021-11-28 LAB — PROTIME-INR
INR: 1.3 — ABNORMAL HIGH (ref 0.8–1.2)
Prothrombin Time: 16.4 seconds — ABNORMAL HIGH (ref 11.4–15.2)

## 2021-11-28 MED ORDER — HEPARIN SODIUM (PORCINE) 1000 UNIT/ML IJ SOLN
INTRAMUSCULAR | Status: AC | PRN
  Administered 2021-11-28: 3800 [IU] via INTRAVENOUS

## 2021-11-28 MED ORDER — FENTANYL CITRATE (PF) 100 MCG/2ML IJ SOLN
INTRAMUSCULAR | Status: AC
  Filled 2021-11-28: qty 2

## 2021-11-28 MED ORDER — MAGIC MOUTHWASH W/LIDOCAINE: 5.0000 mL | Freq: Four times a day (QID) | ORAL | Status: DC | PRN

## 2021-11-28 MED ORDER — HEPARIN SOD (PORK) LOCK FLUSH 100 UNIT/ML IV SOLN: INTRAVENOUS | Status: AC | PRN

## 2021-11-28 MED ORDER — CEFAZOLIN SODIUM-DEXTROSE 2-4 GM/100ML-% IV SOLN
INTRAVENOUS | Status: DC | PRN
  Administered 2021-11-28: 2 g via INTRAVENOUS

## 2021-11-28 MED ORDER — HEPARIN SODIUM (PORCINE) 1000 UNIT/ML DIALYSIS: 20.0000 [IU]/kg | INTRAMUSCULAR | Status: DC | PRN

## 2021-11-28 MED ORDER — CEFAZOLIN SODIUM-DEXTROSE 2-4 GM/100ML-% IV SOLN
INTRAVENOUS | Status: AC
  Filled 2021-11-28: qty 100

## 2021-11-28 MED ORDER — CEFTRIAXONE SODIUM 2 G IV SOLR: INTRAVENOUS | Status: AC | PRN

## 2021-11-28 MED ORDER — INSULIN DETEMIR 100 UNIT/ML ~~LOC~~ SOLN
24.0000 [IU] | Freq: Two times a day (BID) | SUBCUTANEOUS | Status: DC
  Administered 2021-11-28: 24 [IU] via SUBCUTANEOUS
  Filled 2021-11-28 (×2): qty 0.24

## 2021-11-28 MED ORDER — AMIODARONE HCL IN DEXTROSE 360-4.14 MG/200ML-% IV SOLN
60.0000 mg/h | INTRAVENOUS | Status: AC
  Administered 2021-11-28: 60 mg/h via INTRAVENOUS
  Filled 2021-11-28: qty 200

## 2021-11-28 MED ORDER — LIDOCAINE HCL (PF) 1 % IJ SOLN
INTRAMUSCULAR | Status: AC
  Filled 2021-11-28: qty 30

## 2021-11-28 MED ORDER — HEPARIN SODIUM (PORCINE) 1000 UNIT/ML IJ SOLN
INTRAMUSCULAR | Status: AC
  Filled 2021-11-28: qty 10

## 2021-11-28 MED ORDER — LIDOCAINE HCL 1 % IJ SOLN
INTRAMUSCULAR | Status: AC | PRN
  Administered 2021-11-28: 10 mL

## 2021-11-28 MED ORDER — AMIODARONE HCL IN DEXTROSE 360-4.14 MG/200ML-% IV SOLN
30.0000 mg/h | INTRAVENOUS | Status: AC
  Administered 2021-11-28 – 2021-11-30 (×3): 30 mg/h via INTRAVENOUS
  Filled 2021-11-28 (×4): qty 200

## 2021-11-28 MED ORDER — MIDAZOLAM HCL 2 MG/2ML IJ SOLN
INTRAMUSCULAR | Status: AC | PRN
  Administered 2021-11-28: 1 mg via INTRAVENOUS

## 2021-11-28 MED ORDER — MIDAZOLAM HCL 2 MG/2ML IJ SOLN
INTRAMUSCULAR | Status: AC
  Filled 2021-11-28: qty 2

## 2021-11-28 MED ORDER — HEPARIN (PORCINE) 25000 UT/250ML-% IV SOLN
2200.0000 [IU]/h | INTRAVENOUS | Status: DC
  Administered 2021-11-28 – 2021-11-29 (×2): 1900 [IU]/h via INTRAVENOUS
  Administered 2021-11-30: 2050 [IU]/h via INTRAVENOUS
  Filled 2021-11-28 (×3): qty 250

## 2021-11-28 MED ORDER — ATORVASTATIN CALCIUM 80 MG PO TABS
80.0000 mg | ORAL_TABLET | Freq: Every day | ORAL | Status: DC
  Administered 2021-11-29 – 2021-12-04 (×6): 80 mg via ORAL
  Filled 2021-11-28 (×6): qty 1

## 2021-11-28 MED ORDER — INSULIN ASPART 100 UNIT/ML IJ SOLN
18.0000 [IU] | Freq: Once | INTRAMUSCULAR | Status: AC
  Administered 2021-11-28: 18 [IU] via SUBCUTANEOUS

## 2021-11-28 NOTE — Anesthesia Preprocedure Evaluation (Addendum)
Anesthesia Evaluation  Patient identified by MRN, date of birth, ID band Patient awake    Reviewed: Allergy & Precautions, NPO status , Patient's Chart, lab work & pertinent test results  History of Anesthesia Complications Negative for: history of anesthetic complications  Airway Mallampati: I  TM Distance: >3 FB Neck ROM: Full    Dental  (+) Edentulous Upper, Edentulous Lower, Dental Advisory Given   Pulmonary former smoker,    Pulmonary exam normal        Cardiovascular hypertension, + CAD, + CABG and +CHF  + dysrhythmias Atrial Fibrillation  Rhythm:Irregular Rate:Normal  Findings: Please see echo section for full report. Normal LV size and wall motion, EF 55-60%.  Normal wall thickness.  There is septal bounce suggestive of recent cardiac surgery.  Normal RV size with mildly decreased systolic function.  Mild right atrial enlargement, prominent Chiari network.  There was a PFO present noted by color doppler and bubble study.  Mild left atrial enlargement.  No LA appendage thrombus.  Trivial TR, unable to measure RV-RA gradient.  Mild mitral regurgitation.  Trileaflet aortic valve with no significant stenosis or regurgitation.  Normal caliber thoracic aorta with mild plaque.  Small pericardial effusion primarily towards the apex.     Neuro/Psych    GI/Hepatic negative GI ROS, Neg liver ROS,   Endo/Other  diabetes  Renal/GU Renal disease     Musculoskeletal   Abdominal   Peds  Hematology   Anesthesia Other Findings   Reproductive/Obstetrics                            Anesthesia Physical  Anesthesia Plan  ASA: 3  Anesthesia Plan: General   Post-op Pain Management: Minimal or no pain anticipated   Induction: Intravenous  PONV Risk Score and Plan: Treatment may vary due to age or medical condition  Airway Management Planned: Mask  Additional Equipment:   Intra-op Plan:    Post-operative Plan:   Informed Consent: I have reviewed the patients History and Physical, chart, labs and discussed the procedure including the risks, benefits and alternatives for the proposed anesthesia with the patient or authorized representative who has indicated his/her understanding and acceptance.     Dental advisory given  Plan Discussed with: Anesthesiologist and CRNA  Anesthesia Plan Comments:        Anesthesia Quick Evaluation

## 2021-11-28 NOTE — Progress Notes (Signed)
EVENING ROUNDS NOTE :     [address]       [place] [address]             [phone]                 3 Days Post-Op Procedure(s) (LRB): CARDIOVERSION (N/A)   Total Length of Stay:  LOS: 16 days  Events:   No events Resting comfortably    BP (!) 100/50   Pulse 80   Temp 98.7 F (37.1 C) (Oral)   Resp 18   Ht 5\' 10"  (1.778 m)   Wt 120.9 kg   SpO2 95%   BMI 38.24 kg/m   CVP:  [12 mmHg-16 mmHg] 13 mmHg      sodium chloride Stopped (11/26/21 2303)   amiodarone 30 mg/hr (11/28/21 1500)   ampicillin-sulbactam (UNASYN) IV Stopped (11/27/21 2229)   anticoagulant sodium citrate     heparin Stopped (11/28/21 1510)    I/O last 3 completed shifts: In: 1111.3 [P.O.:480; I.V.:432.3; IV Piggyback:199] Out: 2555 [Other:2555]      Latest Ref Rng & Units 11/28/2021    4:54 AM 11/27/2021    1:45 AM 11/26/2021    3:13 PM  CBC  WBC 4.0 - 10.5 K/uL 17.5  24.5  25.2   Hemoglobin 13.0 - 17.0 g/dL 8.1  8.7  8.5   Hematocrit 39.0 - 52.0 % 25.4  27.7  26.2   Platelets 150 - 400 K/uL 316  309  293        Latest Ref Rng & Units 11/28/2021    4:54 AM 11/27/2021    1:45 AM 11/26/2021    4:36 AM  BMP  Glucose 70 - 99 mg/dL 235  243  241   BUN 8 - 23 mg/dL 39  52  35   Creatinine 0.61 - 1.24 mg/dL 5.35  6.66  4.92   Sodium 135 - 145 mmol/L 130  131  130   Potassium 3.5 - 5.1 mmol/L 3.9  4.3  4.1   Chloride 98 - 111 mmol/L 93  93  91   CO2 22 - 32 mmol/L 25  20  23    Calcium 8.9 - 10.3 mg/dL 8.0  8.4  8.2     ABG    Component Value Date/Time   PHART 7.332 (L) 11/15/2021 0410   PCO2ART 44.4 11/15/2021 0410   PO2ART 74 (L) 11/15/2021 0410   HCO3 24.2 11/16/2021 0414   TCO2 26 11/16/2021 0414   ACIDBASEDEF 3.0 (H) 11/16/2021 0414   O2SAT 56.1 11/21/2021 0612       Melodie Bouillon, MD 11/28/2021 4:14 PM

## 2021-11-28 NOTE — Progress Notes (Addendum)
Patient ID: [name] [name], male   DOB: 21-Oct-1955, 66 y.o.   MRN: [id number]     Advanced Heart Failure Rounding Note  PCP-Cardiologist: None   Subjective:    Abx started 5/25 for suspected septic shock.  Initial abx completed and afebrile. WBC trending down to 17. CCM added rocephin 06/01 for possible aspiration. On 6/2, abx transitioned to vanc/cefepime and micafungin added (yeast in resp culture). On 6/6, abx transitioned to Unasyn and micafungin/vancomycin stopped.   6/7 S/P aspiration of hematoma by IR. 55 cc removed.   S/p TEE/DCCV to SR 06/01. 6/5 S/P DC-CV--->back in A fib  last night.    Left thoracentesis 6/2 with 900 cc off. Reassess in IR on 6/7 pleural effusion to small to dranin.   Echo: Difficult study. LV EF 55-60%, RV probably normal, no pericardial effusion.   Getting MWF HD.   Patient had 1 unit PRBCs on 6/6. Hgb 8.7>8.1 today    Complaining of fatigue.   Objective:   Weight Range: 120.9 kg Body mass index is 38.24 kg/m.   Vital Signs:   Temp:  [97.6 F (36.4 C)-98.3 F (36.8 C)] 97.7 F (36.5 C) (06/08 0400) Pulse Rate:  [68-93] 82 (06/08 0600) Resp:  [10-20] 13 (06/08 0600) BP: (92-138)/(40-73) 124/59 (06/08 0600) SpO2:  [80 %-96 %] 96 % (06/08 0600) Weight:  [120.9 kg-122.8 kg] 120.9 kg (06/08 0400) Last BM Date : 11/26/21  Weight change: Filed Weights   11/27/21 0215 11/27/21 1516 11/28/21 0400  Weight: 122.8 kg 122.8 kg 120.9 kg    Intake/Output:   Intake/Output Summary (Last 24 hours) at 11/28/2021 0658 Last data filed at 11/28/2021 0600 Gross per 24 hour  Intake 624.67 ml  Output 2555 ml  Net -1930.33 ml    CVP 11-12   Physical Exam  General:  No resp difficulty HEENT: normal Neck: supple. JVP 11-12 . Carotids 2+ bilat; no bruits. No lymphadenopathy or thryomegaly appreciated. Cor: PMI nondisplaced. Irregular rate & rhythm. No rubs, gallops or murmurs. Lungs: clear Abdomen: soft, nontender, nondistended. No hepatosplenomegaly. No  bruits or masses. Good bowel sounds. Extremities: no cyanosis, clubbing, rash, edema Neuro: alert & orientedx3, cranial nerves grossly intact. moves all 4 extremities w/o difficulty. Affect pleasant  Telemetry   A fib 6/7 ~ 1900  Labs    CBC Recent Labs    11/26/21 0436 11/26/21 1513 11/27/21 0145 11/28/21 0454  WBC 25.2*  25.4*   < > 24.5* 17.5*  NEUTROABS 20.1*  --   --   --   HGB 7.6*  7.6*   < > 8.7* 8.1*  HCT 24.0*  23.8*   < > 27.7* 25.4*  MCV 94.9  93.0   < > 94.2 92.7  PLT 293  287   < > 309 316   < > = values in this interval not displayed.   Basic Metabolic Panel Recent Labs    11/27/21 0145 11/28/21 0454  NA 131* 130*  K 4.3 3.9  CL 93* 93*  CO2 20* 25  GLUCOSE 243* 235*  BUN 52* 39*  CREATININE 6.66* 5.35*  CALCIUM 8.4* 8.0*  MG 2.1 1.8  PHOS 7.8* 5.8*   Liver Function Tests Recent Labs    11/27/21 0145 11/28/21 0454  ALBUMIN 2.6* 2.2*   No results for input(s): "LIPASE", "AMYLASE" in the last 72 hours. Cardiac Enzymes No results for input(s): "CKTOTAL", "CKMB", "CKMBINDEX", "TROPONINI" in the last 72 hours.  BNP: BNP (last 3 results) No results for input(s): "BNP" in  the last 8760 hours.  ProBNP (last 3 results) No results for input(s): "PROBNP" in the last 8760 hours.   D-Dimer No results for input(s): "DDIMER" in the last 72 hours. Hemoglobin A1C No results for input(s): "HGBA1C" in the last 72 hours. Fasting Lipid Panel No results for input(s): "CHOL", "HDL", "LDLCALC", "TRIG", "CHOLHDL", "LDLDIRECT" in the last 72 hours. Thyroid Function Tests No results for input(s): "TSH", "T4TOTAL", "T3FREE", "THYROIDAB" in the last 72 hours.  Invalid input(s): "FREET3"  Other results:   Imaging    CT ASPIRATION  Result Date: 11/27/2021 INDICATION: Recent CABG, now with sepsis of uncertain etiology. Please perform image guided aspiration and/or drainage catheter placement of left-sided retroperitoneal hematoma given concern for  superinfection. Additionally, please perform image guided thoracentesis as indicated. EXAM: CT-GUIDED LEFT-SIDED RETROPERITONEAL HEMATOMA ASPIRATION COMPARISON:  CT abdomen and pelvis-11/25/2021 MEDICATIONS: The patient is currently admitted to the hospital and receiving intravenous antibiotics. The antibiotics were administered within an appropriate time frame prior to the initiation of the procedure. ANESTHESIA/SEDATION: Moderate (conscious) sedation was employed during this procedure. A total of Versed 1 mg and Fentanyl 50 mcg was administered intravenously. Moderate Sedation Time: 18 minutes. The patient's level of consciousness and vital signs were monitored continuously by radiology nursing throughout the procedure under my direct supervision. CONTRAST:  None COMPLICATIONS: None immediate. PROCEDURE: RADIATION DOSE REDUCTION: This exam was performed according to the departmental dose-optimization program which includes automated exposure control, adjustment of the mA and/or kV according to patient size and/or use of iterative reconstruction technique. Informed written consent was obtained from the patient after a discussion of the risks, benefits and alternatives to treatment. The patient was placed supine, slightly RPO on the CT gantry and a pre procedural CT was performed re-demonstrating the known left-sided retroperitoneal hematoma with dominant component measuring approximately 6.6 x 5.9 cm (image 60, series 2). Dedicated imaging of the lower thorax demonstrates a unchanged small/trace left-sided pleural effusion, too small to allow for safe ultrasound-guided thoracentesis. The procedure was planned. A timeout was performed prior to the initiation of the procedure. The skin overlying the lateral aspect of the left mid abdomen was prepped and draped in the usual sterile fashion. The overlying soft tissues were anesthetized with 1% lidocaine with epinephrine. Appropriate trajectory was planned with the use  of a 22 gauge spinal needle. An 18 gauge trocar needle was advanced into the abscess/fluid collection and a short Amplatz super stiff wire was coiled within the collection. Appropriate positioning was confirmed with a limited CT scan. The tract was serially dilated allowing placement of a 10 [place] all-purpose drainage catheter. Imaging was performed demonstrating the drainage catheter coiled within the posterolateral aspect of the left-sided retroperitoneal hematoma. From this location, approximally 30 cc of thick, bloody fluid was aspirated and a drainage catheter was removed. Given residual amount of hematoma, the more central and more hypoattenuating and thus potentially liquified component of the hematoma was targeted with an 18 gauge trocar needle. A short Amplatz wire was coiled within the more central aspect of the collection and a 10 French percutaneous drainage catheter was coiled and locked within the collection. An additional approximately 25 cc of similar appearing thick, bloody fluid was aspirated from the left-sided retroperitoneal hematoma. The external portion of the drainage catheter was cut and drainage catheter was removed intact. A dressing was applied. The patient tolerated the procedure well without immediate postprocedural complication. IMPRESSION: 1. Successful CT guided aspiration of a total of 55 cc of thick, bloody fluid from left-sided  retroperitoneal hematoma. As the collection is favored to represent a sterile non infected hematoma, a drainage catheter was not left in place. All aspirated fluid was capped and sent to the laboratory for analysis. 2. Small/trace partially loculated left-sided pleural effusion, similar to the 11/25/2021 examination and too small to allow for safe image guided thoracentesis. Electronically Signed   By: Sandi Mariscal M.D.   On: 11/27/2021 14:30     Medications:     Scheduled Medications:  amiodarone  400 mg Oral BID   aspirin EC  325 mg Oral Daily    Or   aspirin  324 mg Per Tube Daily   bisacodyl  10 mg Rectal Q0600   Chlorhexidine Gluconate Cloth  6 each Topical Daily   darbepoetin (ARANESP) injection - DIALYSIS  100 mcg Intravenous Q Mon-HD   docusate sodium  200 mg Oral Daily   guaiFENesin  1,200 mg Oral BID   insulin aspart  0-24 Units Subcutaneous TID WC   insulin detemir  22 Units Subcutaneous Q12H   lactulose  10 g Oral Daily   lidocaine  1 patch Transdermal Q24H   metoCLOPramide (REGLAN) injection  5 mg Intravenous Q8H   midodrine  15 mg Oral TID WC   multivitamin  1 tablet Oral QHS   pantoprazole  40 mg Oral Daily   rosuvastatin  20 mg Oral Daily   sodium chloride flush  3 mL Intravenous Q12H    Infusions:  sodium chloride Stopped (11/26/21 2303)   ampicillin-sulbactam (UNASYN) IV Stopped (11/27/21 2229)   anticoagulant sodium citrate     heparin 1,800 Units/hr (11/28/21 0629)    PRN Medications: acetaminophen, alteplase, anticoagulant sodium citrate, heparin, ipratropium-albuterol, lidocaine (PF), lidocaine-prilocaine, ondansetron (ZOFRAN) IV, oxyCODONE, pentafluoroprop-tetrafluoroeth, sodium chloride flush, traMADol    Patient Profile   66 y.o. male with history of CM with recovered EF, persistent atrial fibrillation, DM II, CKD, sick sinus syndrome, OSA, prior smoker. Underwent CABG X 3 and MAZE/PVI on 05/23>>post-op decrease in UOP and AKI with volume overload>>postcardiotomy shock requiring inotrope and pressor support  Assessment/Plan   1. Shock: Echo reviewed, LV EF 55-60%, RV poorly visualized but probably near-normal, no significant pericardial effusion. Suspect sepsis physiology with distributive shock.  Improved with broad spectrum abx. Lactate has cleared. Bcx 5/25 remain NGTD. Now off pressors/resolved.   - ID following.  - WBC trending down.  - Continue midodrine to 15 mg TID 2. Acute on chronic primarily diastolic CHF: LV EF 46-27%, RV poorly visualized but probably near-normal, no significant  pericardial effusion.  Patient is still volume overloaded, suspect primarily right heart failure physiology.  CVVHD started on 5/26. Now getting iHD and tolerating.  - CVP 12  3. AKI on CKD stage 3: Baseline SCr 1.3-1.5 .  Suspect due to shock/ATN as well as elevated renal venous pressure in setting of volume overload, ileus, abdominal distention. CVVHD started 5/26. Remains nearly anuric.  Lost HD line 5/31, replaced 06/01. Now getting iHD. Minimal urine output.  - MWF HD.   4. CAD: S/p CABG. No chest pain.  - Continue ASA, statin.  5. Atrial fibrillation with RVR: Has had Maze and LA appendage clip at time of CABG on 11/12/21. S/p TEE/DCCV to SR on 06//01. Back in AF by 6/2.  S/P DC-CV --> NSR. Back in A fib today.    - Continue heparin gtt as long as hgb stays relatively stable, had recent DCCV so higher risk for CVA.  Eventual transition to Eliquis.  - Stop po  amio and start amio drip.  6. DM2: SSI.  7. ID: Persistent leukocytosis.  Septic shock initially, resolved.  Blood cultures NGTD.  Treated for HCAP, completed vanc and cefepime.  With WBCs rising again and concern for aspiration, he was started back on vanc/cefepime and micafungin given yeast in sputum. CT chest/abd/pelvis with left retroperitoneal fluid collection (?hemorrhage vs abscess), bilateral lower lobe infiltrates vs atelectasis, moderate loculated left pleural effusion.  He was transitioned 6/6 to Unasyn to cover aspiration PNA.  - Continue Unasyn.  - 6/7 S/P IR aspiration of RP hematoma.  - If pleural effusion continues to collect on left, but to small for thoracentesis.   8. Ileus: Improved.  9. Pulmonary: Respiratory status improving with fluid removal and abx. Thoracentesis on 6/2.  - HD for volume removal  - CCM following.   10. RP fluid collection: 6/7 S/P aspiration of hematoma by IR.   Length of Stay: [place], NP  11/28/2021, 6:58 AM  Advanced Heart Failure Team Pager [phone] (M-F; 7a - 5p)  Please contact  [facility] for night-coverage after hours (5p -7a ) and weekends on [email or web address]  Patient seen with NP, agree with the above note.   Aspiration of RP hematoma yesterday, unlikely to be infection. He remains in atrial fibrillation, hgb 8.1 today.   He went back into atrial fibrillation last night.    CVP 13, had HD yesterday.   General: NAD Neck: JVP 10 cm, no thyromegaly or thyroid nodule.  Lungs: Clear to auscultation bilaterally with normal respiratory effort. CV: Nondisplaced PMI.  Heart irregular S1/S2, no S3/S4, no murmur.  No peripheral edema.   Abdomen: Soft, nontender, no hepatosplenomegaly, no distention.  Skin: Intact without lesions or rashes.  Neurologic: Alert and oriented x 3.  Psych: Normal affect. Extremities: No clubbing or cyanosis.  HEENT: Normal.   Patient continues on Unasyn for aspiration PNA.   Has RP hematoma, but continuing on heparin gtt with atrial fibrillation. Hgb 8.1, transfuse < 8.   Restart amiodarone gtt and repeat DCCV tomorrow if he does not convert to NSR.   Loralie Champagne 11/28/2021 7:30 AM

## 2021-11-28 NOTE — H&P (View-Only) (Signed)
Patient ID: [name] [name], male   DOB: 19-Jan-1956, 66 y.o.   MRN: [id number]     Advanced Heart Failure Rounding Note  PCP-Cardiologist: None   Subjective:    Abx started 5/25 for suspected septic shock.  Initial abx completed and afebrile. WBC trending down to 17. CCM added rocephin 06/01 for possible aspiration. On 6/2, abx transitioned to vanc/cefepime and micafungin added (yeast in resp culture). On 6/6, abx transitioned to Unasyn and micafungin/vancomycin stopped.   6/7 S/P aspiration of hematoma by IR. 55 cc removed.   S/p TEE/DCCV to SR 06/01. 6/5 S/P DC-CV--->back in A fib  last night.    Left thoracentesis 6/2 with 900 cc off. Reassess in IR on 6/7 pleural effusion to small to dranin.   Echo: Difficult study. LV EF 55-60%, RV probably normal, no pericardial effusion.   Getting MWF HD.   Patient had 1 unit PRBCs on 6/6. Hgb 8.7>8.1 today    Complaining of fatigue.   Objective:   Weight Range: 120.9 kg Body mass index is 38.24 kg/m.   Vital Signs:   Temp:  [97.6 F (36.4 C)-98.3 F (36.8 C)] 97.7 F (36.5 C) (06/08 0400) Pulse Rate:  [68-93] 82 (06/08 0600) Resp:  [10-20] 13 (06/08 0600) BP: (92-138)/(40-73) 124/59 (06/08 0600) SpO2:  [80 %-96 %] 96 % (06/08 0600) Weight:  [120.9 kg-122.8 kg] 120.9 kg (06/08 0400) Last BM Date : 11/26/21  Weight change: Filed Weights   11/27/21 0215 11/27/21 1516 11/28/21 0400  Weight: 122.8 kg 122.8 kg 120.9 kg    Intake/Output:   Intake/Output Summary (Last 24 hours) at 11/28/2021 0658 Last data filed at 11/28/2021 0600 Gross per 24 hour  Intake 624.67 ml  Output 2555 ml  Net -1930.33 ml    CVP 11-12   Physical Exam  General:  No resp difficulty HEENT: normal Neck: supple. JVP 11-12 . Carotids 2+ bilat; no bruits. No lymphadenopathy or thryomegaly appreciated. Cor: PMI nondisplaced. Irregular rate & rhythm. No rubs, gallops or murmurs. Lungs: clear Abdomen: soft, nontender, nondistended. No hepatosplenomegaly. No  bruits or masses. Good bowel sounds. Extremities: no cyanosis, clubbing, rash, edema Neuro: alert & orientedx3, cranial nerves grossly intact. moves all 4 extremities w/o difficulty. Affect pleasant  Telemetry   A fib 6/7 ~ 1900  Labs    CBC Recent Labs    11/26/21 0436 11/26/21 1513 11/27/21 0145 11/28/21 0454  WBC 25.2*  25.4*   < > 24.5* 17.5*  NEUTROABS 20.1*  --   --   --   HGB 7.6*  7.6*   < > 8.7* 8.1*  HCT 24.0*  23.8*   < > 27.7* 25.4*  MCV 94.9  93.0   < > 94.2 92.7  PLT 293  287   < > 309 316   < > = values in this interval not displayed.   Basic Metabolic Panel Recent Labs    11/27/21 0145 11/28/21 0454  NA 131* 130*  K 4.3 3.9  CL 93* 93*  CO2 20* 25  GLUCOSE 243* 235*  BUN 52* 39*  CREATININE 6.66* 5.35*  CALCIUM 8.4* 8.0*  MG 2.1 1.8  PHOS 7.8* 5.8*   Liver Function Tests Recent Labs    11/27/21 0145 11/28/21 0454  ALBUMIN 2.6* 2.2*   No results for input(s): "LIPASE", "AMYLASE" in the last 72 hours. Cardiac Enzymes No results for input(s): "CKTOTAL", "CKMB", "CKMBINDEX", "TROPONINI" in the last 72 hours.  BNP: BNP (last 3 results) No results for input(s): "BNP" in  the last 8760 hours.  ProBNP (last 3 results) No results for input(s): "PROBNP" in the last 8760 hours.   D-Dimer No results for input(s): "DDIMER" in the last 72 hours. Hemoglobin A1C No results for input(s): "HGBA1C" in the last 72 hours. Fasting Lipid Panel No results for input(s): "CHOL", "HDL", "LDLCALC", "TRIG", "CHOLHDL", "LDLDIRECT" in the last 72 hours. Thyroid Function Tests No results for input(s): "TSH", "T4TOTAL", "T3FREE", "THYROIDAB" in the last 72 hours.  Invalid input(s): "FREET3"  Other results:   Imaging    CT ASPIRATION  Result Date: 11/27/2021 INDICATION: Recent CABG, now with sepsis of uncertain etiology. Please perform image guided aspiration and/or drainage catheter placement of left-sided retroperitoneal hematoma given concern for  superinfection. Additionally, please perform image guided thoracentesis as indicated. EXAM: CT-GUIDED LEFT-SIDED RETROPERITONEAL HEMATOMA ASPIRATION COMPARISON:  CT abdomen and pelvis-11/25/2021 MEDICATIONS: The patient is currently admitted to the hospital and receiving intravenous antibiotics. The antibiotics were administered within an appropriate time frame prior to the initiation of the procedure. ANESTHESIA/SEDATION: Moderate (conscious) sedation was employed during this procedure. A total of Versed 1 mg and Fentanyl 50 mcg was administered intravenously. Moderate Sedation Time: 18 minutes. The patient's level of consciousness and vital signs were monitored continuously by radiology nursing throughout the procedure under my direct supervision. CONTRAST:  None COMPLICATIONS: None immediate. PROCEDURE: RADIATION DOSE REDUCTION: This exam was performed according to the departmental dose-optimization program which includes automated exposure control, adjustment of the mA and/or kV according to patient size and/or use of iterative reconstruction technique. Informed written consent was obtained from the patient after a discussion of the risks, benefits and alternatives to treatment. The patient was placed supine, slightly RPO on the CT gantry and a pre procedural CT was performed re-demonstrating the known left-sided retroperitoneal hematoma with dominant component measuring approximately 6.6 x 5.9 cm (image 60, series 2). Dedicated imaging of the lower thorax demonstrates a unchanged small/trace left-sided pleural effusion, too small to allow for safe ultrasound-guided thoracentesis. The procedure was planned. A timeout was performed prior to the initiation of the procedure. The skin overlying the lateral aspect of the left mid abdomen was prepped and draped in the usual sterile fashion. The overlying soft tissues were anesthetized with 1% lidocaine with epinephrine. Appropriate trajectory was planned with the use  of a 22 gauge spinal needle. An 18 gauge trocar needle was advanced into the abscess/fluid collection and a short Amplatz super stiff wire was coiled within the collection. Appropriate positioning was confirmed with a limited CT scan. The tract was serially dilated allowing placement of a 10 [place] all-purpose drainage catheter. Imaging was performed demonstrating the drainage catheter coiled within the posterolateral aspect of the left-sided retroperitoneal hematoma. From this location, approximally 30 cc of thick, bloody fluid was aspirated and a drainage catheter was removed. Given residual amount of hematoma, the more central and more hypoattenuating and thus potentially liquified component of the hematoma was targeted with an 18 gauge trocar needle. A short Amplatz wire was coiled within the more central aspect of the collection and a 10 French percutaneous drainage catheter was coiled and locked within the collection. An additional approximately 25 cc of similar appearing thick, bloody fluid was aspirated from the left-sided retroperitoneal hematoma. The external portion of the drainage catheter was cut and drainage catheter was removed intact. A dressing was applied. The patient tolerated the procedure well without immediate postprocedural complication. IMPRESSION: 1. Successful CT guided aspiration of a total of 55 cc of thick, bloody fluid from left-sided  retroperitoneal hematoma. As the collection is favored to represent a sterile non infected hematoma, a drainage catheter was not left in place. All aspirated fluid was capped and sent to the laboratory for analysis. 2. Small/trace partially loculated left-sided pleural effusion, similar to the 11/25/2021 examination and too small to allow for safe image guided thoracentesis. Electronically Signed   By: Sandi Mariscal M.D.   On: 11/27/2021 14:30     Medications:     Scheduled Medications:  amiodarone  400 mg Oral BID   aspirin EC  325 mg Oral Daily    Or   aspirin  324 mg Per Tube Daily   bisacodyl  10 mg Rectal Q0600   Chlorhexidine Gluconate Cloth  6 each Topical Daily   darbepoetin (ARANESP) injection - DIALYSIS  100 mcg Intravenous Q Mon-HD   docusate sodium  200 mg Oral Daily   guaiFENesin  1,200 mg Oral BID   insulin aspart  0-24 Units Subcutaneous TID WC   insulin detemir  22 Units Subcutaneous Q12H   lactulose  10 g Oral Daily   lidocaine  1 patch Transdermal Q24H   metoCLOPramide (REGLAN) injection  5 mg Intravenous Q8H   midodrine  15 mg Oral TID WC   multivitamin  1 tablet Oral QHS   pantoprazole  40 mg Oral Daily   rosuvastatin  20 mg Oral Daily   sodium chloride flush  3 mL Intravenous Q12H    Infusions:  sodium chloride Stopped (11/26/21 2303)   ampicillin-sulbactam (UNASYN) IV Stopped (11/27/21 2229)   anticoagulant sodium citrate     heparin 1,800 Units/hr (11/28/21 0629)    PRN Medications: acetaminophen, alteplase, anticoagulant sodium citrate, heparin, ipratropium-albuterol, lidocaine (PF), lidocaine-prilocaine, ondansetron (ZOFRAN) IV, oxyCODONE, pentafluoroprop-tetrafluoroeth, sodium chloride flush, traMADol    Patient Profile   66 y.o. male with history of CM with recovered EF, persistent atrial fibrillation, DM II, CKD, sick sinus syndrome, OSA, prior smoker. Underwent CABG X 3 and MAZE/PVI on 05/23>>post-op decrease in UOP and AKI with volume overload>>postcardiotomy shock requiring inotrope and pressor support  Assessment/Plan   1. Shock: Echo reviewed, LV EF 55-60%, RV poorly visualized but probably near-normal, no significant pericardial effusion. Suspect sepsis physiology with distributive shock.  Improved with broad spectrum abx. Lactate has cleared. Bcx 5/25 remain NGTD. Now off pressors/resolved.   - ID following.  - WBC trending down.  - Continue midodrine to 15 mg TID 2. Acute on chronic primarily diastolic CHF: LV EF 40-98%, RV poorly visualized but probably near-normal, no significant  pericardial effusion.  Patient is still volume overloaded, suspect primarily right heart failure physiology.  CVVHD started on 5/26. Now getting iHD and tolerating.  - CVP 12  3. AKI on CKD stage 3: Baseline SCr 1.3-1.5 .  Suspect due to shock/ATN as well as elevated renal venous pressure in setting of volume overload, ileus, abdominal distention. CVVHD started 5/26. Remains nearly anuric.  Lost HD line 5/31, replaced 06/01. Now getting iHD. Minimal urine output.  - MWF HD.   4. CAD: S/p CABG. No chest pain.  - Continue ASA, statin.  5. Atrial fibrillation with RVR: Has had Maze and LA appendage clip at time of CABG on 11/12/21. S/p TEE/DCCV to SR on 06//01. Back in AF by 6/2.  S/P DC-CV --> NSR. Back in A fib today.    - Continue heparin gtt as long as hgb stays relatively stable, had recent DCCV so higher risk for CVA.  Eventual transition to Eliquis.  - Stop po  amio and start amio drip.  6. DM2: SSI.  7. ID: Persistent leukocytosis.  Septic shock initially, resolved.  Blood cultures NGTD.  Treated for HCAP, completed vanc and cefepime.  With WBCs rising again and concern for aspiration, he was started back on vanc/cefepime and micafungin given yeast in sputum. CT chest/abd/pelvis with left retroperitoneal fluid collection (?hemorrhage vs abscess), bilateral lower lobe infiltrates vs atelectasis, moderate loculated left pleural effusion.  He was transitioned 6/6 to Unasyn to cover aspiration PNA.  - Continue Unasyn.  - 6/7 S/P IR aspiration of RP hematoma.  - If pleural effusion continues to collect on left, but to small for thoracentesis.   8. Ileus: Improved.  9. Pulmonary: Respiratory status improving with fluid removal and abx. Thoracentesis on 6/2.  - HD for volume removal  - CCM following.   10. RP fluid collection: 6/7 S/P aspiration of hematoma by IR.   Length of Stay: [name], NP  11/28/2021, 6:58 AM  Advanced Heart Failure Team Pager [phone] (M-F; 7a - 5p)  Please contact  [facility] for night-coverage after hours (5p -7a ) and weekends on [email or web address]  Patient seen with NP, agree with the above note.   Aspiration of RP hematoma yesterday, unlikely to be infection. He remains in atrial fibrillation, hgb 8.1 today.   He went back into atrial fibrillation last night.    CVP 13, had HD yesterday.   General: NAD Neck: JVP 10 cm, no thyromegaly or thyroid nodule.  Lungs: Clear to auscultation bilaterally with normal respiratory effort. CV: Nondisplaced PMI.  Heart irregular S1/S2, no S3/S4, no murmur.  No peripheral edema.   Abdomen: Soft, nontender, no hepatosplenomegaly, no distention.  Skin: Intact without lesions or rashes.  Neurologic: Alert and oriented x 3.  Psych: Normal affect. Extremities: No clubbing or cyanosis.  HEENT: Normal.   Patient continues on Unasyn for aspiration PNA.   Has RP hematoma, but continuing on heparin gtt with atrial fibrillation. Hgb 8.1, transfuse < 8.   Restart amiodarone gtt and repeat DCCV tomorrow if he does not convert to NSR.   [name] [name] 11/28/2021 7:30 AM

## 2021-11-28 NOTE — Progress Notes (Signed)
Heparin held at this time for tunneled catheter placement today.

## 2021-11-28 NOTE — Progress Notes (Signed)
[facility]  Date of Admission:  11/12/2021     Total days of antibiotics 15         ASSESSMENT:  [name] [name] continues to improve slowly with improvements in his back/hip and WBC count now down to 17.5. Gram stain of retroperitoneal hematoma aspiration with no organisms and cultures pending. There remains no clear source of infection. Will continue with likely short course of Unasyn. Remains on hemodialysis and noted to be in atrial flutter yesterday with management per Nephrology and Cardiology. May benefit from magic mouthwash. Remaining medical and supportive care per primary team.   PLAN:  Continue current dose of Unasyn.  Monitor hematoma cultures for organism identification if present.  Dialysis per Nephrology. Remaining medical and supportive per primary team.   Principal Problem:   S/P CABG x 3 Active Problems:   Leukocytosis    aspirin EC  325 mg Oral Daily   Or   aspirin  324 mg Per Tube Daily   bisacodyl  10 mg Rectal Q0600   Chlorhexidine Gluconate Cloth  6 each Topical Daily   darbepoetin (ARANESP) injection - DIALYSIS  100 mcg Intravenous Q Mon-HD   docusate sodium  200 mg Oral Daily   guaiFENesin  1,200 mg Oral BID   insulin aspart  0-24 Units Subcutaneous TID WC   insulin detemir  22 Units Subcutaneous Q12H   lactulose  10 g Oral Daily   lidocaine  1 patch Transdermal Q24H   metoCLOPramide (REGLAN) injection  5 mg Intravenous Q8H   midodrine  15 mg Oral TID WC   multivitamin  1 tablet Oral QHS   pantoprazole  40 mg Oral Daily   rosuvastatin  20 mg Oral Daily   sodium chloride flush  3 mL Intravenous Q12H    SUBJECTIVE:  Afebrile overnight with no acute events.   No Known Allergies   Review of Systems: Review of Systems  Constitutional:  Negative for chills, fever and weight loss.  Respiratory:  Negative for cough, shortness of breath and wheezing.   Cardiovascular:  Negative for chest pain and leg swelling.   Gastrointestinal:  Negative for abdominal pain, constipation, diarrhea, nausea and vomiting.  Skin:  Negative for rash.      OBJECTIVE: Vitals:   11/28/21 0815 11/28/21 0830 11/28/21 0845 11/28/21 0900  BP: 108/61 (!) 106/58 (!) 116/54 (!) 102/58  Pulse: 86 79 85 84  Resp: 12 11 12 13   Temp:      TempSrc:      SpO2: 94% 94% 94% 94%  Weight:      Height:       Body mass index is 38.24 kg/m.  Physical Exam Constitutional:      General: He is not in acute distress.    Appearance: He is well-developed.  Cardiovascular:     Rate and Rhythm: Normal rate and regular rhythm.     Heart sounds: Normal heart sounds.  Pulmonary:     Effort: Pulmonary effort is normal.     Breath sounds: Normal breath sounds.  Skin:    General: Skin is warm and dry.  Neurological:     Mental Status: He is alert and oriented to person, place, and time.  Psychiatric:        Behavior: Behavior normal.        Thought Content: Thought content normal.        Judgment: Judgment normal.     Lab Results Lab Results  Component Value  Date   WBC 17.5 (H) 11/28/2021   HGB 8.1 (L) 11/28/2021   HCT 25.4 (L) 11/28/2021   MCV 92.7 11/28/2021   PLT 316 11/28/2021    Lab Results  Component Value Date   CREATININE 5.35 (H) 11/28/2021   BUN 39 (H) 11/28/2021   NA 130 (L) 11/28/2021   K 3.9 11/28/2021   CL 93 (L) 11/28/2021   CO2 25 11/28/2021    Lab Results  Component Value Date   ALT 9 11/18/2021   AST 26 11/18/2021   ALKPHOS 51 11/18/2021   BILITOT 0.8 11/18/2021     Microbiology: Recent Results (from the past 240 hour(s))  Expectorated Sputum Assessment w Gram Stain, Rflx to Resp Cult     Status: None   Collection Time: 11/20/21 11:10 AM   Specimen: Expectorated Sputum  Result Value Ref Range Status   Specimen Description EXPECTORATED SPUTUM  Final   Special Requests NONE  Final   Sputum evaluation   Final    THIS SPECIMEN IS ACCEPTABLE FOR SPUTUM CULTURE Performed at [facility], [place] [address] [place], [place] [address]    Report Status 11/20/2021 FINAL  Final  Culture, Respiratory w Gram Stain     Status: None   Collection Time: 11/20/21 11:10 AM  Result Value Ref Range Status   Specimen Description EXPECTORATED SPUTUM  Final   Special Requests NONE Reflexed from [id number]  Final   Gram Stain   Final    ABUNDANT WBC PRESENT, PREDOMINANTLY PMN FEW YEAST WITH PSEUDOHYPHAE BUDDING YEAST SEEN RARE GRAM POSITIVE RODS Performed at [facility], [place] [address], [place], [place] [address]    Culture FEW CANDIDA ALBICANS  Final   Report Status 11/23/2021 FINAL  Final  Body fluid culture w Gram Stain     Status: None   Collection Time: 11/22/21  1:25 PM   Specimen: Pleural Fluid  Result Value Ref Range Status   Specimen Description PLEURAL  Final   Special Requests NONE  Final   Gram Stain   Final    WBC PRESENT, PREDOMINANTLY PMN NO ORGANISMS SEEN CYTOSPIN SMEAR    Culture   Final    NO GROWTH 3 DAYS Performed at [facility], [place] [address] [place], [place] [address]    Report Status 11/25/2021 FINAL  Final  Urine Culture     Status: None   Collection Time: 11/23/21  5:45 AM   Specimen: Urine, Clean Catch  Result Value Ref Range Status   Specimen Description URINE, CLEAN CATCH  Final   Special Requests NONE  Final   Culture   Final    NO GROWTH Performed at [facility], [place] [address] [place], [place] [address]    Report Status 11/24/2021 FINAL  Final  Aerobic/Anaerobic Culture w Gram Stain (surgical/deep wound)     Status: None (Preliminary result)   Collection Time: 11/27/21 11:48 AM   Specimen: Abscess  Result Value Ref Range Status   Specimen Description ABSCESS  Final   Special Requests   Final    POST CT GUIDED LEFT RETROPERITONEAL HEMATOMA ASPIRATION   Gram Stain   Final    NO WBC SEEN NO ORGANISMS SEEN Performed at [facility], [place] [address], [place], [place] [address]    Culture PENDING  Incomplete    Report Status PENDING  Incomplete     Terri Piedra, [facility]  11/28/2021  9:54 AM

## 2021-11-28 NOTE — Progress Notes (Signed)
[facility] for IV heparin Indication: atrial fibrillation  No Known Allergies  Patient Measurements: Height: 5\' 10"  (177.8 cm) Weight: 120.9 kg (266 lb 8.6 oz) IBW/kg (Calculated) : 73 Heparin Dosing Weight: ~ 100 kg  Vital Signs: Temp: 96.6 F (35.9 C) (06/08 1100) Temp Source: Axillary (06/08 1100) BP: 106/55 (06/08 1400) Pulse Rate: 114 (06/08 1400)  Labs: Recent Labs    11/26/21 0436 11/26/21 0438 11/26/21 1513 11/27/21 0145 11/28/21 0454 11/28/21 1407  HGB 7.6*  7.6*  --  8.5* 8.7* 8.1*  --   HCT 24.0*  23.8*  --  26.2* 27.7* 25.4*  --   PLT 293  287  --  293 309 316  --   LABPROT  --   --   --  15.9*  --  16.4*  INR  --   --   --  1.3*  --  1.3*  HEPARINUNFRC  --    < > 0.15* 0.15* <0.10* 0.22*  CREATININE 4.92*  --   --  6.66* 5.35*  --    < > = values in this interval not displayed.     Estimated Creatinine Clearance: 17.5 mL/min (A) (by C-G formula based on SCr of 5.35 mg/dL (H)).   Medical History: Past Medical History:  Diagnosis Date   Atrial fibrillation ([facility])    Benign essential HTN 07/30/2014   Bradycardia 01/25/2015   CHF (congestive heart failure) (HCC)    Chronic anticoagulation 70/48/8891   Chronic systolic CHF (congestive heart failure), NYHA class 3 ([place]) 08/30/2014   Overview:  Global ef 30%   CKD (chronic kidney disease)    Class 3 severe obesity in adult ([place]) 04/28/2017   Coronary artery disease    Diabetes mellitus without complication (HCC)    Diabetic neuropathy ([place]) 07/28/2014   Dysrhythmia    Erectile dysfunction    High risk medication use 05/26/2018   Hypertension    Hypertensive heart disease with heart failure ([place]) 07/30/2014   Lumbago    LV dysfunction 04/28/2017   Mixed hyperlipidemia 07/28/2014   OSA (obstructive sleep apnea)    CPAP   Paroxysmal atrial fibrillation ([place]) 07/28/2014   Sinus node dysfunction ([place]) 04/21/2016   Testicular hypofunction    Uncontrolled  type 2 diabetes mellitus with microalbuminuric diabetic nephropathy 07/28/2014   Assessment: 66 yo male on chronic Xarelto for afib.  Now s/p CABG with MAZE, post op AKI CRRT>iHD.   Pharmacy asked to dose IV heparin. Targeting lowering heparin level goal given recent OHS.  Heparin drip placed on hold yesterday for IR procedure,> 59ml drained from ab hematoma  resumed after heparin drip 1800 uts/hr heparin level 0.22 < goal  No bleeding noted, hgb 8 stable s/p 2uts prbc, pltc stable  S/P DCCV x2 now back in Afib> restart IV amiodarone and possible DCCV 6/9  Convert to oral anticoagulant when procedures complete  Goal of Therapy:  Heparin level 0.3-0.5 Monitor platelets by anticoagulation protocol: Yes   Plan:  Increase heparin drip 1800 units/hr  Daily heparin level and cbc    [name] Pharm.D. CPP, BCPS Clinical Pharmacist [phone] 11/28/2021 3:20 PM

## 2021-11-28 NOTE — Progress Notes (Signed)
Physical Therapy Treatment Patient Details Name: [name] [name] MRN: [id number] DOB: [birth year] Today's Date: 11/28/2021   History of Present Illness Pt is a 66 y.o. male admitted 11/12/21 for multivessel CABG with Maze procedure; extubated 5/24. Postop course complicated by oliguria and NSVT vs. Afib/RVR, shock, worsening respiratory status, suspected ileus. Pt also with worsening renal function; CRRT initiated 5/26. S/p cardioversion 6/1. S/p L thoracentesis 6/2. Persistent afib 6/4; s/p cardioversion 6/5. CT chest/abd/pelvis with enlargement L psoas muscle (hemorrhage vs abscess), bilateral lower lobe infiltrates vs atelectasis, L pleural effusionS/p L psoas aspiration 6/7. For tunneled HD cath placement 6/8.   PMH includes CAD, CHF, CKD, DM2, HTN, afib (chronic anticoagulation), OSA.   PT Comments    Pt remains self-limiting with activity progression, only agreeable to LE therex this session (HEP handout provided), noted L hip weakness but pt notes significant improvement in pain since aspiration; today's reason for not wanting to get OOB was because pt awaiting transport for tunneled HD cath placement. Pt reports improving ability to ambulate to bathroom with nursing. Will continue to follow acutely to address established goals.    Recommendations for follow up therapy are one component of a multi-disciplinary discharge planning process, led by the attending physician.  Recommendations may be updated based on patient status, additional functional criteria and insurance authorization.  Follow Up Recommendations  Home health PT     Assistance Recommended at Discharge Intermittent Supervision/Assistance  Patient can return home with the following A little help with bathing/dressing/bathroom;Assistance with cooking/housework;Assist for transportation;Help with stairs or ramp for entrance   Equipment Recommendations  Rollator (4 wheels) (bariatric-sized)    Recommendations for Other Services        Precautions / Restrictions Precautions Precautions: Fall;Sternal     Mobility  Bed Mobility Overal bed mobility: Modified Independent             General bed mobility comments: pt mod indep with repositioning in bed and sitting EOB    Transfers                   General transfer comment:  (pt adamantly declines)    Ambulation/Gait                   Stairs             Wheelchair Mobility    Modified Rankin (Stroke Patients Only)       Balance                                            Cognition Arousal/Alertness: Awake/alert Behavior During Therapy: WFL for tasks assessed/performed, Flat affect Overall Cognitive Status: Within Functional Limits for tasks assessed                                 General Comments: self-limiting activity progression        Exercises General Exercises - Lower Extremity Heel Slides: AROM, Right, AAROM, Left, Supine Straight Leg Raises: AROM, Right, AAROM, Left, Supine Other Exercises Other Exercises: [employer] HEP handout (Access Code [email or web address]) provided - SLR, heel slides, repeated sit<>stand, standing calf raises Other Exercises: pt with difficulty performing LLE SLR and heel slide due to c/o weakness (suspect related to L psoas aspiration) - able to perform with AAROM    General Comments General comments (skin  integrity, edema, etc.): pt adamantly declines OOB mobility secondary to waiting for transport to get tunneled HD cath (although RN reports time for session); pt only agreeable to educ on therex (only agreeable to perform supine therex; demonstrated seated/standing therex), HEP provided      Pertinent Vitals/Pain Pain Assessment Pain Assessment: Faces Faces Pain Scale: Hurts a little bit Pain Location: L hip/groin (pt notes significant improvement in pain since aspiration yesterday) Pain Descriptors / Indicators: Discomfort, Sore Pain Intervention(s):  Monitored during session    Home Living                          Prior Function            PT Goals (current goals can now be found in the care plan section) Acute Rehab PT Goals Patient Stated Goal: return home PT Goal Formulation: With patient Time For Goal Achievement: 12/12/21 Potential to Achieve Goals: Good Progress towards PT goals: Goals met and updated - see care plan (self-limiting)    Frequency    Min 3X/week      PT Plan Current plan remains appropriate    Co-evaluation              AM-PAC PT "6 Clicks" Mobility   Outcome Measure  Help needed turning from your back to your side while in a flat bed without using bedrails?: None Help needed moving from lying on your back to sitting on the side of a flat bed without using bedrails?: None Help needed moving to and from a bed to a chair (including a wheelchair)?: A Little Help needed standing up from a chair using your arms (e.g., wheelchair or bedside chair)?: A Little Help needed to walk in hospital room?: A Little Help needed climbing 3-5 steps with a railing? : A Little 6 Click Score: 20    End of Session   Activity Tolerance:  (self-limiting) Patient left: in bed;with call bell/phone within reach Nurse Communication: Mobility status PT Visit Diagnosis: Other abnormalities of gait and mobility (R26.89);Muscle weakness (generalized) (M62.81);Unsteadiness on feet (R26.81);Difficulty in walking, not elsewhere classified (R26.2)     Time: 9983-3825 PT Time Calculation (min) (ACUTE ONLY): 14 min  Charges:  $Therapeutic Exercise: 8-22 mins                     [name] [name], PT, DPT Acute Rehabilitation Services  Pager [phone] Office [phone]  [name] [name] 11/28/2021, 5:21 PM

## 2021-11-28 NOTE — Progress Notes (Signed)
ANTICOAGULATION CONSULT NOTE - Follow Up Consult  Pharmacy Consult for heparin Indication: atrial fibrillation  Labs: Recent Labs    11/26/21 0436 11/26/21 0438 11/26/21 1513 11/27/21 0145 11/28/21 0454  HGB 7.6*  7.6*  --  8.5* 8.7* 8.1*  HCT 24.0*  23.8*  --  26.2* 27.7* 25.4*  PLT 293  287  --  293 309 316  LABPROT  --   --   --  15.9*  --   INR  --   --   --  1.3*  --   HEPARINUNFRC  --    < > 0.15* 0.15* <0.10*  CREATININE 4.92*  --   --  6.66* 5.35*   < > = values in this interval not displayed.    Assessment: 66yo male subtherapeutic on heparin after resuming post IR; no infusion issues or signs of bleeding per RN.  Goal of Therapy:  Heparin level 0.3-0.5 units/ml   Plan:  Will increase heparin infusion slightly to 1800 units/hr and check level in 8 hours.    Wynona Neat, PharmD, BCPS  11/28/2021,6:18 AM

## 2021-11-28 NOTE — Procedures (Signed)
  Procedure:  R IJ tunneled HD CVC placement Palindrome 23 Preprocedure diagnosis: Diagnoses of Paroxysmal atrial fibrillation (HCC) and Coronary artery disease involving native coronary artery of native heart, unspecified whether angina present were pertinent to this visit.  Postprocedure diagnosis: same EBL:    minimal Complications:   none immediate  See full dictation in [facility].  Dillard Cannon MD Main # [phone] Pager  [phone] Mobile [phone]

## 2021-11-28 NOTE — Progress Notes (Addendum)
Pt returned from IR s/p tunneled HD catheter placement to right IJ. No c/o pain, VSS, resting comfortably.

## 2021-11-28 NOTE — Progress Notes (Signed)
Out Patient Arrangements:  Noted nephrologist requesting to arrange out pt HD for pt. Have submitted medical records to [facility] admissions. Please advise of d/c date.   Linus Orn HPSS [phone]

## 2021-11-28 NOTE — Progress Notes (Signed)
[place] for IV heparin Indication: atrial fibrillation  No Known Allergies  Patient Measurements: Height: 5\' 10"  (177.8 cm) Weight: 120.9 kg (266 lb 8.6 oz) IBW/kg (Calculated) : 73 Heparin Dosing Weight: ~ 100 kg  Vital Signs: Temp: 98.7 F (37.1 C) (06/08 1601) Temp Source: Oral (06/08 1601) BP: 136/54 (06/08 1727) Pulse Rate: 87 (06/08 1739)  Labs: Recent Labs    11/26/21 0436 11/26/21 0438 11/26/21 1513 11/27/21 0145 11/28/21 0454 11/28/21 1407  HGB 7.6*  7.6*  --  8.5* 8.7* 8.1*  --   HCT 24.0*  23.8*  --  26.2* 27.7* 25.4*  --   PLT 293  287  --  293 309 316  --   LABPROT  --   --   --  15.9*  --  16.4*  INR  --   --   --  1.3*  --  1.3*  HEPARINUNFRC  --    < > 0.15* 0.15* <0.10* 0.22*  CREATININE 4.92*  --   --  6.66* 5.35*  --    < > = values in this interval not displayed.     Estimated Creatinine Clearance: 17.5 mL/min (A) (by C-G formula based on SCr of 5.35 mg/dL (H)).   Medical History: Past Medical History:  Diagnosis Date   Atrial fibrillation ([place])    Benign essential HTN 07/30/2014   Bradycardia 01/25/2015   CHF (congestive heart failure) (HCC)    Chronic anticoagulation 16/03/9603   Chronic systolic CHF (congestive heart failure), NYHA class 3 ([place]) 08/30/2014   Overview:  Global ef 30%   CKD (chronic kidney disease)    Class 3 severe obesity in adult ([place]) 04/28/2017   Coronary artery disease    Diabetes mellitus without complication (HCC)    Diabetic neuropathy ([place]) 07/28/2014   Dysrhythmia    Erectile dysfunction    High risk medication use 05/26/2018   Hypertension    Hypertensive heart disease with heart failure ([place]) 07/30/2014   Lumbago    LV dysfunction 04/28/2017   Mixed hyperlipidemia 07/28/2014   OSA (obstructive sleep apnea)    CPAP   Paroxysmal atrial fibrillation ([place]) 07/28/2014   Sinus node dysfunction ([place]) 04/21/2016   Testicular hypofunction    Uncontrolled type 2  diabetes mellitus with microalbuminuric diabetic nephropathy 07/28/2014   Assessment: 66 yo male on chronic Xarelto for afib.  Now s/p CABG with MAZE, post op AKI CRRT>iHD.   Pharmacy asked to dose IV heparin. Targeting lowering heparin level goal given recent OHS.  Heparin infusion stopped for IR placement of R IJ tunneled HD CVC catheter. Pharmacy consulted to resume heparin 4 hours post-procedure that completed at 16:57.  No bleeding noted, hgb 8.1 - stable s/p 2uts prbc, pltc stable  S/P DCCV x2 now back in Afib> restart IV amiodarone and possible DCCV 6/9  Convert to oral anticoagulant when procedures complete  Goal of Therapy:  Heparin level 0.3-0.5 Monitor platelets by anticoagulation protocol: Yes   Plan:  Resume heparin infusion at 1900 units/hr at 21:00  Heparin level 8 hrs after infusion start (~0500) Monitor daily CBC, heparin levels and s/sx of bleeding F/u plan for procedures and plan for transition to oral anticoagulation  Luisa Hart, PharmD, BCPS Clinical Pharmacist 11/28/2021 6:27 PM   Please refer to AMION for pharmacy phone number

## 2021-11-28 NOTE — Patient Outreach (Signed)
[facility]) Care Management  11/28/2021  [name] [name] 21-Oct-1955 [id number]   Telephone Screen  Referral Date: 11/28/2021 Referral Source:Hospital Inpt Team Referral Reason: "DM, A1C 9.3"    Referral received. Noted that patient has [employer] and Medicaid and enrolled in D-SNP program. Patient being followed by [facility] CM team.    Plan: RN CM will close referral.   Enzo Montgomery, RN,BSN,CCM [facility] Management Telephonic Care Management Coordinator Direct Phone: [phone] Toll Free: [phone] Fax: [phone]

## 2021-11-28 NOTE — Progress Notes (Addendum)
TCTS DAILY ICU PROGRESS NOTE                   [address]            [place] [address]          [phone]   3 Days Post-Op Procedure(s) (LRB): CARDIOVERSION (N/A)  Total Length of Stay:  LOS: 16 days   Subjective: Patient states left buttock/hip feels better since drainage yesterday. He had a large bowel movement.  Objective: Vital signs in last 24 hours: Temp:  [97.6 F (36.4 C)-98.9 F (37.2 C)] 98.9 F (37.2 C) (06/08 0742) Pulse Rate:  [68-93] 82 (06/08 0600) Cardiac Rhythm: Atrial fibrillation (06/07 2000) Resp:  [10-20] 13 (06/08 0600) BP: (92-138)/(40-73) 124/59 (06/08 0600) SpO2:  [80 %-96 %] 96 % (06/08 0600) Weight:  [120.9 kg-122.8 kg] 120.9 kg (06/08 0400)  Filed Weights   11/27/21 0215 11/27/21 1516 11/28/21 0400  Weight: 122.8 kg 122.8 kg 120.9 kg    Weight change: 0 kg   Hemodynamic parameters for last 24 hours: CVP:  [13 mmHg-16 mmHg] 14 mmHg  Intake/Output from previous day: 06/07 0701 - 06/08 0700 In: 624.7 [P.O.:240; I.V.:285.7; IV Piggyback:99] Out: 2555   Intake/Output this shift: No intake/output data recorded.  Current Meds: Scheduled Meds:  aspirin EC  325 mg Oral Daily   Or   aspirin  324 mg Per Tube Daily   bisacodyl  10 mg Rectal Q0600   Chlorhexidine Gluconate Cloth  6 each Topical Daily   darbepoetin (ARANESP) injection - DIALYSIS  100 mcg Intravenous Q Mon-HD   docusate sodium  200 mg Oral Daily   guaiFENesin  1,200 mg Oral BID   insulin aspart  0-24 Units Subcutaneous TID WC   insulin detemir  22 Units Subcutaneous Q12H   lactulose  10 g Oral Daily   lidocaine  1 patch Transdermal Q24H   metoCLOPramide (REGLAN) injection  5 mg Intravenous Q8H   midodrine  15 mg Oral TID WC   multivitamin  1 tablet Oral QHS   pantoprazole  40 mg Oral Daily   rosuvastatin  20 mg Oral Daily   sodium chloride flush  3 mL Intravenous Q12H   Continuous Infusions:  sodium chloride Stopped (11/26/21 2303)   amiodarone      Followed by   amiodarone     ampicillin-sulbactam (UNASYN) IV Stopped (11/27/21 2229)   anticoagulant sodium citrate     heparin 1,800 Units/hr (11/28/21 0629)   PRN Meds:.acetaminophen, alteplase, anticoagulant sodium citrate, heparin, ipratropium-albuterol, lidocaine (PF), lidocaine-prilocaine, ondansetron (ZOFRAN) IV, oxyCODONE, pentafluoroprop-tetrafluoroeth, sodium chloride flush, traMADol  General appearance: alert, cooperative, and no distress Heart: IRRR IRRR Lungs: clear to auscultation bilaterally Abdomen: Soft, obese, non tender, bowel sounds present Extremities: Trace LE edema Wounds: Clean and dry  Lab Results: CBC: Recent Labs    11/27/21 0145 11/28/21 0454  WBC 24.5* 17.5*  HGB 8.7* 8.1*  HCT 27.7* 25.4*  PLT 309 316   BMET:  Recent Labs    11/27/21 0145 11/28/21 0454  NA 131* 130*  K 4.3 3.9  CL 93* 93*  CO2 20* 25  GLUCOSE 243* 235*  BUN 52* 39*  CREATININE 6.66* 5.35*  CALCIUM 8.4* 8.0*    CMET: Lab Results  Component Value Date   WBC 17.5 (H) 11/28/2021   HGB 8.1 (L) 11/28/2021   HCT 25.4 (L) 11/28/2021   PLT 316 11/28/2021   GLUCOSE 235 (H) 11/28/2021   ALT 9 11/18/2021  AST 26 11/18/2021   NA 130 (L) 11/28/2021   K 3.9 11/28/2021   CL 93 (L) 11/28/2021   CREATININE 5.35 (H) 11/28/2021   BUN 39 (H) 11/28/2021   CO2 25 11/28/2021   INR 1.3 (H) 11/27/2021   HGBA1C 9.3 (H) 11/08/2021      PT/INR:  Recent Labs    11/27/21 0145  LABPROT 15.9*  INR 1.3*   Radiology: CT ASPIRATION  Result Date: 11/27/2021 INDICATION: Recent CABG, now with sepsis of uncertain etiology. Please perform image guided aspiration and/or drainage catheter placement of left-sided retroperitoneal hematoma given concern for superinfection. Additionally, please perform image guided thoracentesis as indicated. EXAM: CT-GUIDED LEFT-SIDED RETROPERITONEAL HEMATOMA ASPIRATION COMPARISON:  CT abdomen and pelvis-11/25/2021 MEDICATIONS: The patient is currently  admitted to the hospital and receiving intravenous antibiotics. The antibiotics were administered within an appropriate time frame prior to the initiation of the procedure. ANESTHESIA/SEDATION: Moderate (conscious) sedation was employed during this procedure. A total of Versed 1 mg and Fentanyl 50 mcg was administered intravenously. Moderate Sedation Time: 18 minutes. The patient's level of consciousness and vital signs were monitored continuously by radiology nursing throughout the procedure under my direct supervision. CONTRAST:  None COMPLICATIONS: None immediate. PROCEDURE: RADIATION DOSE REDUCTION: This exam was performed according to the departmental dose-optimization program which includes automated exposure control, adjustment of the mA and/or kV according to patient size and/or use of iterative reconstruction technique. Informed written consent was obtained from the patient after a discussion of the risks, benefits and alternatives to treatment. The patient was placed supine, slightly RPO on the CT gantry and a pre procedural CT was performed re-demonstrating the known left-sided retroperitoneal hematoma with dominant component measuring approximately 6.6 x 5.9 cm (image 60, series 2). Dedicated imaging of the lower thorax demonstrates a unchanged small/trace left-sided pleural effusion, too small to allow for safe ultrasound-guided thoracentesis. The procedure was planned. A timeout was performed prior to the initiation of the procedure. The skin overlying the lateral aspect of the left mid abdomen was prepped and draped in the usual sterile fashion. The overlying soft tissues were anesthetized with 1% lidocaine with epinephrine. Appropriate trajectory was planned with the use of a 22 gauge spinal needle. An 18 gauge trocar needle was advanced into the abscess/fluid collection and a short Amplatz super stiff wire was coiled within the collection. Appropriate positioning was confirmed with a limited CT  scan. The tract was serially dilated allowing placement of a 10 [place] all-purpose drainage catheter. Imaging was performed demonstrating the drainage catheter coiled within the posterolateral aspect of the left-sided retroperitoneal hematoma. From this location, approximally 30 cc of thick, bloody fluid was aspirated and a drainage catheter was removed. Given residual amount of hematoma, the more central and more hypoattenuating and thus potentially liquified component of the hematoma was targeted with an 18 gauge trocar needle. A short Amplatz wire was coiled within the more central aspect of the collection and a 10 French percutaneous drainage catheter was coiled and locked within the collection. An additional approximately 25 cc of similar appearing thick, bloody fluid was aspirated from the left-sided retroperitoneal hematoma. The external portion of the drainage catheter was cut and drainage catheter was removed intact. A dressing was applied. The patient tolerated the procedure well without immediate postprocedural complication. IMPRESSION: 1. Successful CT guided aspiration of a total of 55 cc of thick, bloody fluid from left-sided retroperitoneal hematoma. As the collection is favored to represent a sterile non infected hematoma, a drainage catheter was not  left in place. All aspirated fluid was capped and sent to the laboratory for analysis. 2. Small/trace partially loculated left-sided pleural effusion, similar to the 11/25/2021 examination and too small to allow for safe image guided thoracentesis. Electronically Signed   By: Sandi Mariscal M.D.   On: 11/27/2021 14:30     Assessment/Plan: S/P Procedure(s) (LRB): CARDIOVERSION (N/A) CV-Post op a fib/flutter. Has had DCCV x 2. He went back into a fib so on Amiodarone drip again. On Midodrine 15 mg tid and Heparin drip 2. Pulmonary-History of OSA (on CPAP). On 2 liters via [place]. Wean as able. Encourage incentive spirometer. 3. Acute on chronic CHF-Heart  failure had been following 4. Expected post op blood loss anemia 5. DM-CBGs 150/241/220. Pre op HGA1C 9.3. Will not restart Metformin or Glipizide yet as creatinine elevated. Will need close medical follow up after discharge 6. AKI on CKD (stage III)-Creatinine decreased to 5.35 this am. Nephrology following and arranging for HD 7. Physical deconditioning-Continue PT. Home PT ordered 8. Leukocytosis-On Unasyn. WBC decreased to 17,500. Blood culture show no growth to date. S/p CT guided aspiration by IR for retroperitoneal hematoma (hematoma). 55 cc removed   Donielle Liston Alba PA-C 11/28/2021 7:43 AM   WBC improved after aspiration retroperitoneal  fluid collection and change of antibiotics. Cardiology to repeat DC cardioversion tomorrow for recurrent afib Glucose still not controlled- increase Levemir dose  patient examined and medical record reviewed,agree with above note. Dahlia Byes 11/28/2021

## 2021-11-28 NOTE — Consult Note (Signed)
Chief Complaint: Acute on chronic kidney disease  Referring Physician(s): Col[place]  Supervising Physician: Arne Cleveland  Patient Status: [facility] - In-pt  History of Present Illness: [name] [name] is a 66 y.o. male with AKI which is likely ischemic ATN in the setting of septic versus cardiogenic shock.    He was started on CRRT 5/26   New temp HD catheter placed 11/21/21  His WBC improving and we are asked for tunneled HD cath placement.  He is not NPO so will only use one  medical and lidocaine.  Past Medical History:  Diagnosis Date   Atrial fibrillation ([place])    Benign essential HTN 07/30/2014   Bradycardia 01/25/2015   CHF (congestive heart failure) (HCC)    Chronic anticoagulation 94/[id number]/4627   Chronic systolic CHF (congestive heart failure), NYHA class 3 ([place]) 08/30/2014   Overview:  Global ef 30%   CKD (chronic kidney disease)    Class 3 severe obesity in adult [facility]) 04/28/2017   Coronary artery disease    Diabetes mellitus without complication ([place])    Diabetic neuropathy ([place]) 07/28/2014   Dysrhythmia    Erectile dysfunction    High risk medication use 05/26/2018   Hypertension    Hypertensive heart disease with heart failure ([place]) 07/30/2014   Lumbago    LV dysfunction 04/28/2017   Mixed hyperlipidemia 07/28/2014   OSA (obstructive sleep apnea)    CPAP   Paroxysmal atrial fibrillation ([place]) 07/28/2014   Sinus node dysfunction ([place]) 04/21/2016   Testicular hypofunction    Uncontrolled type 2 diabetes mellitus with microalbuminuric diabetic nephropathy 07/28/2014    Past Surgical History:  Procedure Laterality Date   BUBBLE STUDY  11/21/2021   Procedure: BUBBLE STUDY;  Surgeon: Larey Dresser, MD;  Location: [place];  Service: Cardiovascular;;   CARDIOVERSION N/A 11/21/2021   Procedure: CARDIOVERSION;  Surgeon: Larey Dresser, MD;  Location: [facility] ENDOSCOPY;  Service: Cardiovascular;  Laterality: N/A;   CARDIOVERSION N/A 11/25/2021   Procedure:  CARDIOVERSION;  Surgeon: Larey Dresser, MD;  Location: [facility] ENDOSCOPY;  Service: Cardiovascular;  Laterality: N/A;   CORONARY ARTERY BYPASS GRAFT N/A 11/12/2021   Procedure: CORONARY ARTERY BYPASS GRAFTING (CABG) x  3 USING LEFT INTERNAL MAMMARY ARTERY AND RIGHT GREATER SAPHENOUS VEIN;  Surgeon: Dahlia Byes, MD;  Location: [place];  Service: Open Heart Surgery;  Laterality: N/A;   ELECTROPHYSIOLOGIC STUDY N/A 01/18/2015   Procedure: CARDIOVERSION;  Surgeon: [name], MD;  Location: [facility] ORS;  Service: Cardiovascular;  Laterality: N/A;   ENDOVEIN HARVEST OF GREATER SAPHENOUS VEIN  11/12/2021   Procedure: ENDOVEIN HARVEST OF GREATER SAPHENOUS VEIN;  Surgeon: Dahlia Byes, MD;  Location: [place];  Service: Open Heart Surgery;;   EYE SURGERY Bilateral 2022   LEFT HEART CATH AND CORONARY ANGIOGRAPHY N/A 10/24/2021   Procedure: LEFT HEART CATH AND CORONARY ANGIOGRAPHY;  Surgeon: Jettie Booze, MD;  Location: [place] CV LAB;  Service: Cardiovascular;  Laterality: N/A;   MAZE N/A 11/12/2021   Procedure: MAZE;  Surgeon: Dahlia Byes, MD;  Location: [place];  Service: Open Heart Surgery;  Laterality: N/A;   TEE WITHOUT CARDIOVERSION N/A 11/12/2021   Procedure: TRANSESOPHAGEAL ECHOCARDIOGRAM (TEE);  Surgeon: Dahlia Byes, MD;  Location: [place];  Service: Open Heart Surgery;  Laterality: N/A;   TEE WITHOUT CARDIOVERSION N/A 11/21/2021   Procedure: TRANSESOPHAGEAL ECHOCARDIOGRAM (TEE);  Surgeon: Larey Dresser, MD;  Location: [facility] ENDOSCOPY;  Service: Cardiovascular;  Laterality: N/A;    Allergies: Patient has no known  allergies.  Medications: Prior to Admission medications   Medication Sig Start Date End Date Taking? Authorizing Provider  Dulaglutide (TRULICITY) 3 BE/6.7JQ SOPN Inject 3 mg into the skin every Monday.   Yes [provider]  FARXIGA 10 MG TABS tablet Take 10 mg by mouth daily. 08/13/18  Yes [provider]  fluticasone furoate-vilanterol (BREO  ELLIPTA) 200-25 MCG/ACT AEPB Inhale 1 puff into the lungs daily. 01/02/18  Yes [provider]  furosemide (LASIX) 20 MG tablet Take 20 mg by mouth daily.   Yes [provider]  glipiZIDE (GLUCOTROL) 10 MG tablet Take 10 mg by mouth 2 (two) times daily before a meal.   Yes [provider]  losartan (COZAAR) 50 MG tablet Take 50 mg by mouth daily. 10/02/20  Yes [provider]  metFORMIN (GLUCOPHAGE) 1000 MG tablet Take 1 tablet (1,000 mg total) by mouth 2 (two) times daily with a meal. 10/26/21  Yes Jettie Booze, MD  metoprolol succinate (TOPROL-XL) 50 MG 24 hr tablet Take 1 tablet (50 mg total) by mouth daily. Take with or immediately following a meal. 10/21/21  Yes Camnitz, Ocie Doyne, MD  rivaroxaban (XARELTO) 20 MG TABS tablet Take 1 tablet (20 mg total) by mouth daily with supper. 10/25/21  Yes Jettie Booze, MD  rosuvastatin (CRESTOR) 20 MG tablet Take 20 mg by mouth daily. 03/19/21  Yes [provider]  albuterol (VENTOLIN HFA) 108 (90 Base) MCG/ACT inhaler Inhale 2 puffs into the lungs every 6 (six) hours as needed for wheezing or shortness of breath.    [provider]     Family History  Problem Relation Age of Onset   CAD Brother    Drug abuse Brother    Lung cancer Father    CAD Mother     Social History   Socioeconomic History   Marital status: Married    Spouse name: Not on file   Number of children: Not on file   Years of education: Not on file   Highest education level: Not on file  Occupational History   Not on file  Tobacco Use   Smoking status: Former    Types: Cigarettes    Quit date: 2007    Years since quitting: 16.4   Smokeless tobacco: Never  Vaping Use   Vaping Use: Never used  Substance and Sexual Activity   Alcohol use: No   Drug use: Yes    Frequency: 1.0 times per week    Types: Marijuana   Sexual activity: Not on file  Other Topics Concern   Not on file  Social History Narrative    Not on file   Social Determinants of Health   Financial Resource Strain: Not on file  Food Insecurity: Not on file  Transportation Needs: Not on file  Physical Activity: Not on file  Stress: Not on file  Social Connections: Not on file     Review of Systems: A 12 point ROS discussed and pertinent positives are indicated in the HPI above.  All other systems are negative.  Review of Systems  Vital Signs: BP (!) 100/50   Pulse 80   Temp 98.7 F (37.1 C) (Oral)   Resp 18   Ht 5\' 10"  (1.778 m)   Wt 266 lb 8.6 oz (120.9 kg)   SpO2 95%   BMI 38.24 kg/m   Physical Exam Vitals reviewed.  Constitutional:      Appearance: Normal appearance. He is obese.  HENT:  Head: Normocephalic and atraumatic.  Eyes:     Extraocular Movements: Extraocular movements intact.  Cardiovascular:     Rate and Rhythm: Normal rate and regular rhythm.  Pulmonary:     Effort: Pulmonary effort is normal. No respiratory distress.  Abdominal:     Palpations: Abdomen is soft.  Musculoskeletal:        General: Normal range of motion.     Cervical back: Normal range of motion.  Skin:    General: Skin is warm and dry.  Neurological:     General: No focal deficit present.     Mental Status: He is alert and oriented to person, place, and time.  Psychiatric:        Mood and Affect: Mood normal.        Behavior: Behavior normal.        Thought Content: Thought content normal.        Judgment: Judgment normal.     Imaging: DG Chest Port 1 View  Result Date: 11/28/2021 CLINICAL DATA:  History of CABG. EXAM: PORTABLE CHEST 1 VIEW COMPARISON:  AP chest 11/26/2021 FINDINGS: Status post median sternotomy and CABG. Cardiac silhouette is again at least moderately enlarged. Mediastinal contours are within normal limits. Calcification is again seen within the aortic arch. Interval removal of right upper extremity PICC. Redemonstration of left internal jugular central venous catheter sheath with associated  catheter tip overlying the superior vena cava, unchanged. Mild left interstitial thickening is similar to prior. Left basilar heterogeneous opacification again likely corresponding to the loculated left posterior pleural effusion and associated atelectasis better seen on prior CT. No pneumothorax. Mild multilevel degenerative disc changes of the thoracic spine. IMPRESSION: 1. Interval removal of right upper extremity PICC. 2. Stable enlarged cardiac silhouette. 3. Mild left interstitial thickening and basilar heterogeneous opacification are not significantly changed, corresponding to the loculated left basilar pleural effusion better seen on prior CT. Electronically Signed   By: Yvonne Kendall M.D.   On: 11/28/2021 08:22   CT ASPIRATION  Result Date: 11/27/2021 INDICATION: Recent CABG, now with sepsis of uncertain etiology. Please perform image guided aspiration and/or drainage catheter placement of left-sided retroperitoneal hematoma given concern for superinfection. Additionally, please perform image guided thoracentesis as indicated. EXAM: CT-GUIDED LEFT-SIDED RETROPERITONEAL HEMATOMA ASPIRATION COMPARISON:  CT abdomen and pelvis-11/25/2021 MEDICATIONS: The patient is currently admitted to the hospital and receiving intravenous antibiotics. The antibiotics were administered within an appropriate time frame prior to the initiation of the procedure. ANESTHESIA/SEDATION: Moderate (conscious) sedation was employed during this procedure. A total of Versed 1 mg and Fentanyl 50 mcg was administered intravenously. Moderate Sedation Time: 18 minutes. The patient's level of consciousness and vital signs were monitored continuously by radiology nursing throughout the procedure under my direct supervision. CONTRAST:  None COMPLICATIONS: None immediate. PROCEDURE: RADIATION DOSE REDUCTION: This exam was performed according to the departmental dose-optimization program which includes automated exposure control, adjustment of  the mA and/or kV according to patient size and/or use of iterative reconstruction technique. Informed written consent was obtained from the patient after a discussion of the risks, benefits and alternatives to treatment. The patient was placed supine, slightly RPO on the CT gantry and a pre procedural CT was performed re-demonstrating the known left-sided retroperitoneal hematoma with dominant component measuring approximately 6.6 x 5.9 cm (image 60, series 2). Dedicated imaging of the lower thorax demonstrates a unchanged small/trace left-sided pleural effusion, too small to allow for safe ultrasound-guided thoracentesis. The procedure was planned. A timeout was performed  prior to the initiation of the procedure. The skin overlying the lateral aspect of the left mid abdomen was prepped and draped in the usual sterile fashion. The overlying soft tissues were anesthetized with 1% lidocaine with epinephrine. Appropriate trajectory was planned with the use of a 22 gauge spinal needle. An 18 gauge trocar needle was advanced into the abscess/fluid collection and a short Amplatz super stiff wire was coiled within the collection. Appropriate positioning was confirmed with a limited CT scan. The tract was serially dilated allowing placement of a 10 [place] all-purpose drainage catheter. Imaging was performed demonstrating the drainage catheter coiled within the posterolateral aspect of the left-sided retroperitoneal hematoma. From this location, approximally 30 cc of thick, bloody fluid was aspirated and a drainage catheter was removed. Given residual amount of hematoma, the more central and more hypoattenuating and thus potentially liquified component of the hematoma was targeted with an 18 gauge trocar needle. A short Amplatz wire was coiled within the more central aspect of the collection and a 10 French percutaneous drainage catheter was coiled and locked within the collection. An additional approximately 25 cc of  similar appearing thick, bloody fluid was aspirated from the left-sided retroperitoneal hematoma. The external portion of the drainage catheter was cut and drainage catheter was removed intact. A dressing was applied. The patient tolerated the procedure well without immediate postprocedural complication. IMPRESSION: 1. Successful CT guided aspiration of a total of 55 cc of thick, bloody fluid from left-sided retroperitoneal hematoma. As the collection is favored to represent a sterile non infected hematoma, a drainage catheter was not left in place. All aspirated fluid was capped and sent to the laboratory for analysis. 2. Small/trace partially loculated left-sided pleural effusion, similar to the 11/25/2021 examination and too small to allow for safe image guided thoracentesis. Electronically Signed   By: Sandi Mariscal M.D.   On: 11/27/2021 14:30   DG Chest Port 1 View  Result Date: 11/26/2021 CLINICAL DATA:  Postoperative CABG EXAM: PORTABLE CHEST 1 VIEW COMPARISON:  11/25/2021, 11/23/2021 FINDINGS: Right-sided PICC line and left IJ central venous catheter are stable in positioning. Prior sternotomy and CABG. Stable cardiomegaly. Aortic atherosclerosis. Left basilar opacity, increased from 11/23/2021. Loculated left-sided pleural effusion, better seen on recent CT. Right lung remains clear. No pneumothorax. IMPRESSION: Persistent left basilar opacity with loculated left-sided pleural effusion, better seen on recent CT. Electronically Signed   By: Davina Poke D.O.   On: 11/26/2021 08:45   CT CHEST ABDOMEN PELVIS WO CONTRAST  Result Date: 11/25/2021 CLINICAL DATA:  Sepsis, elevated WBC. EXAM: CT CHEST, ABDOMEN AND PELVIS WITHOUT CONTRAST TECHNIQUE: Multidetector CT imaging of the chest, abdomen and pelvis was performed following the standard protocol without IV contrast. RADIATION DOSE REDUCTION: This exam was performed according to the departmental dose-optimization program which includes automated  exposure control, adjustment of the mA and/or kV according to patient size and/or use of iterative reconstruction technique. COMPARISON:  None Available. FINDINGS: CT CHEST FINDINGS Cardiovascular: Heart is normal in size and there is a hyperdense pericardial effusion measuring up to 1.3 cm in thickness. Three-vessel coronary artery calcifications are noted. There is atherosclerotic calcification of the aorta without evidence of aneurysm. Pulmonary trunk is mildly distended which may be associated with underlying pulmonary artery hypertension. The distal tip of a central venous catheter terminates in the superior vena cava. Mediastinum/Nodes: No mediastinal or axillary lymphadenopathy by size criteria. Evaluation of the hila is limited due to lack of IV contrast. The thyroid gland and esophagus are  within normal limits. A small amount of debris is noted in the trachea. Lungs/Pleura: Mild atelectasis or infiltrate is noted in the lower lobes bilaterally. There is a small pleural effusion on the right and a moderate loculated pleural effusion on the left. No pneumothorax. A few scattered pulmonary nodules are noted on the right measuring up to 3 mm, axial image 65. Musculoskeletal: Sternotomy wires are noted in the midline. No acute or suspicious osseous abnormality. CT ABDOMEN PELVIS FINDINGS Hepatobiliary: Subcentimeter hypodensity is present in the anterior right lobe of the liver which is too small to further characterize. The gallbladder is without stones. No biliary ductal dilatation. Pancreas: Unremarkable. No pancreatic ductal dilatation or surrounding inflammatory changes. Spleen: Normal in size without focal abnormality. Adrenals/Urinary Tract: The adrenal glands are within normal limits. A nonobstructive calculus is noted on the left. No ureteral calculus or obstructive uropathy bilaterally. The bladder is unremarkable. Stomach/Bowel: Stomach is within normal limits. Appendix appears normal. No evidence of  bowel wall thickening, distention, or inflammatory changes. No free air or pneumatosis. A moderate amount of retained stool is noted in the colon. Vascular/Lymphatic: Aortic atherosclerosis. No enlarged abdominal or pelvic lymph nodes. Reproductive: Prostate is unremarkable. Other: Fat containing inguinal hernias bilaterally. Small fat containing umbilical hernia. Musculoskeletal: There is asymmetric enlargement of the psoas muscle on the left with a hypodense region with a hyperdense rim measuring 8.3 x 4.2 x 3.5 cm. There is surrounding hyperdense material in the retroperitoneum on the left. Degenerative changes are noted in the lumbar spine. No acute osseous abnormality. IMPRESSION: 1. Asymmetric enlargement of the psoas muscle on the left containing a hypodense region with surrounding hyperdense material within the muscle and in the retroperitoneum on the left, possible hemorrhage versus abscess. 2. Atelectasis or infiltrate in the lower lobes bilaterally. 3. Small right pleural effusion and moderate loculated pleural effusion on the left. 4. Small hyperdense pericardial effusion, possibly reflecting blood products versus infection. 5. A few scattered pulmonary nodules bilaterally measuring up to 3 mm. No follow-up needed if patient is low-risk (and has no known or suspected primary neoplasm). Non-contrast chest CT can be considered in 12 months if patient is high-risk. This recommendation follows the consensus statement: Guidelines for Management of Incidental Pulmonary Nodules Detected on CT Images: From the Fleischner Society 2017; Radiology 2017; 284:228-243. 6. Aortic atherosclerosis. 7. Nonobstructive left renal calculus. Electronically Signed   By: Brett Fairy M.D.   On: 11/25/2021 21:55   DG Chest Port 1 View  Result Date: 11/23/2021 CLINICAL DATA:  CABG EXAM: PORTABLE CHEST 1 VIEW COMPARISON:  11/22/2021 FINDINGS: Right-sided PICC line and left IJ central venous catheter are stable in positioning.  Prior sternotomy and CABG. Stable cardiomegaly. Previously seen left suprahilar opacity is improving compared to prior. No new focal airspace consolidation. No large pleural fluid collection. No pneumothorax. IMPRESSION: Improving left suprahilar opacity.  Otherwise stable chest. Electronically Signed   By: Davina Poke D.O.   On: 11/23/2021 09:15   DG CHEST PORT 1 VIEW  Result Date: 11/22/2021 CLINICAL DATA:  Status post thoracentesis. EXAM: PORTABLE CHEST 1 VIEW COMPARISON:  November 22, 2021. FINDINGS: Stable cardiomegaly. Status post coronary bypass graft. Left internal jugular and right-sided PICC line are unchanged in position. No pneumothorax is noted. No significant pleural effusion is noted. Mild left suprahilar opacity is noted concerning for atelectasis or possibly infiltrate. Bony thorax is unremarkable. IMPRESSION: Mild left suprahilar opacity is noted concerning for atelectasis or possibly infiltrate. Electronically Signed   By: Jeneen Rinks  Murlean Caller M.D.   On: 11/22/2021 14:01   DG CHEST PORT 1 VIEW  Result Date: 11/22/2021 CLINICAL DATA:  Pleural effusion. EXAM: PORTABLE CHEST 1 VIEW COMPARISON:  November 21, 2021. FINDINGS: Stable cardiomegaly. Right-sided PICC line and left internal jugular catheter unchanged in position. Right lung is clear. Stable left basilar opacity is noted suggesting atelectasis and probable associated pleural effusion. Bony thorax is unremarkable. IMPRESSION: Stable left basilar opacity is noted suggesting atelectasis and probable associated pleural effusion. Electronically Signed   By: Marijo Conception M.D.   On: 11/22/2021 07:59   DG CHEST PORT 1 VIEW  Result Date: 11/21/2021 CLINICAL DATA:  Shortness of breath chest pain. EXAM: PORTABLE CHEST 1 VIEW COMPARISON:  Chest radiograph performed earlier on the same day FINDINGS: The heart is enlarged. Evidence of prior coronary artery bypass grafting. Left IJ access catheter with distal tip in the SVC. Right access PICC with distal  tip in the SVC. Mild elevation of the left hemidiaphragm with left basilar opacity which may represent small effusion/atelectasis, unchanged. IMPRESSION: Interval placement of left IJ access central line. Stable cardiomegaly with left basilar opacity suggesting effusion/atelectasis, unchanged. Electronically Signed   By: Keane Police D.O.   On: 11/21/2021 16:32   ECHO TEE  Result Date: 11/21/2021    TRANSESOPHOGEAL ECHO REPORT   Patient Name:   [name] [name] Date of Exam: 11/21/2021 Medical Rec #:  [id number]    Height:       70.0 in Accession #:    [id number]   Weight:       257.6 lb Date of Birth:  1955-11-14   BSA:          2.324 m Patient Age:    46 years     BP:           97/60 mmHg Patient Gender: M            HR:           83 bpm. Exam Location:  Inpatient Procedure: Transesophageal Echo, Color Doppler and Saline Contrast Bubble Study Indications:     I48.91* Unspeicified atrial fibrillation  History:         Patient has prior history of Echocardiogram examinations, most                  recent 11/13/2021. CHF, CAD, Arrythmias:Bradycardia and Atrial                  Fibrillation; Risk Factors:Dyslipidemia, Hypertension and                  Diabetes.  Sonographer:     Bernadene Person RDCS Referring Phys:  [place] Diagnosing Phys: Franki Monte PROCEDURE: After discussion of the risks and benefits of a TEE, an informed consent was obtained from the patient. The transesophogeal probe was passed without difficulty through the esophogus of the patient. Sedation performed by different physician. The patient was monitored while under deep sedation. Anesthestetic sedation was provided intravenously by Anesthesiology: 56.28mg  of Propofol. The patient's vital signs; including heart rate, blood pressure, and oxygen saturation; remained stable throughout the procedure. The patient developed no complications during the procedure. A successful direct current cardioversion was performed at 200 joules with  1 attempt. IMPRESSIONS  1. Left ventricular ejection fraction, by estimation, is 55 to 60%. The left ventricle has normal function. The left ventricle has no regional wall motion abnormalities but there is septal bounce suggestive of prior cardiac surgery.  2. Right ventricular systolic function is mildly reduced. The right ventricular size is normal. Tricuspid regurgitation signal is inadequate for assessing PA pressure.  3. Left atrial size was mildly dilated. No left atrial/left atrial appendage thrombus was detected.  4. There is a PFO by color doppler and bubble study.  5. The mitral valve is normal in structure. Mild mitral valve regurgitation. No evidence of mitral stenosis.  6. The aortic valve is tricuspid. There is mild calcification of the aortic valve. Aortic valve regurgitation is not visualized. No aortic stenosis is present.  7. A small pericardial effusion is present, primarily towards the apex. FINDINGS  Left Ventricle: Left ventricular ejection fraction, by estimation, is 55 to 60%. The left ventricle has normal function. The left ventricle has no regional wall motion abnormalities. The left ventricular internal cavity size was normal in size. There is  no left ventricular hypertrophy. Right Ventricle: The right ventricular size is normal. No increase in right ventricular wall thickness. Right ventricular systolic function is mildly reduced. Tricuspid regurgitation signal is inadequate for assessing PA pressure. Left Atrium: Left atrial size was mildly dilated. No left atrial/left atrial appendage thrombus was detected. Right Atrium: Right atrial size was normal in size. Pericardium: A small pericardial effusion is present. Mitral Valve: The mitral valve is normal in structure. Mild mitral valve regurgitation. No evidence of mitral valve stenosis. Tricuspid Valve: The tricuspid valve is normal in structure. Tricuspid valve regurgitation is trivial. Aortic Valve: The aortic valve is tricuspid. There  is mild calcification of the aortic valve. Aortic valve regurgitation is not visualized. No aortic stenosis is present. Pulmonic Valve: The pulmonic valve was normal in structure. Pulmonic valve regurgitation is not visualized. Aorta: The aortic root is normal in size and structure. IAS/Shunts: There is a PFO by color doppler and bubble study. Agitated saline contrast was given intravenously to evaluate for intracardiac shunting. [employer] Electronically signed by Franki Monte Signature Date/Time: 11/21/2021/3:32:50 PM    Final    DG CHEST PORT 1 VIEW  Result Date: 11/21/2021 CLINICAL DATA:  Pleural effusion EXAM: PORTABLE CHEST 1 VIEW COMPARISON:  11/20/2021 FINDINGS: Left IJ introducer has been removed. Right subclavian line is unchanged. Lung aeration is similar with persistent left upper and lower lung opacities. Probable small left pleural effusion remains present. Stable cardiomegaly. IMPRESSION: Lines and tubes as above. Persistent left upper and lower lung opacities and probable small left pleural effusion. Electronically Signed   By: Macy Mis M.D.   On: 11/21/2021 08:06   DG Chest Port 1 View  Result Date: 11/20/2021 CLINICAL DATA:  Post CABG EXAM: PORTABLE CHEST 1 VIEW COMPARISON:  11/19/2021 FINDINGS: Left IJ introducer now overlies the distal left IJ. Right subclavian line is unchanged. Persistent left basilar and suprahilar opacities. Stable cardiomegaly. No pneumothorax. Small left effusion. IMPRESSION: No significant change. Persistent left basilar in suprahilar opacities and small left effusion. Electronically Signed   By: Macy Mis M.D.   On: 11/20/2021 08:13   DG CHEST PORT 1 VIEW  Result Date: 11/19/2021 CLINICAL DATA:  Central line placement EXAM: PORTABLE CHEST 1 VIEW COMPARISON:  11/19/2021 FINDINGS: Interval placement of right subclavian central venous catheter with the tip at the cavoatrial junction. No pneumothorax Pre-existing right jugular sheath unchanged in  position in the right innominate vein. Left jugular central venous catheter tip in the proximal SVC also unchanged Postop CABG with cardiac enlargement. Asymmetric airspace disease left upper lobe and left lower lobe unchanged. Small left effusion. Right lung remains clear.  IMPRESSION: Right subclavian central venous catheter tip at the cavoatrial junction. No pneumothorax Asymmetric airspace disease on the left is unchanged. Electronically Signed   By: Franchot Gallo M.D.   On: 11/19/2021 11:39   DG CHEST PORT 1 VIEW  Result Date: 11/19/2021 CLINICAL DATA:  Pneumonia, history of CHF, CABG on 11/12/2021 EXAM: PORTABLE CHEST 1 VIEW COMPARISON:  Chest radiograph 11/18/2021 FINDINGS: The right IJ vascular sheath and left IJ vascular catheter are in stable position terminating in the region of the right brachiocephalic vein and confluence of the brachiocephalic veins, respectively. Median sternotomy wires are unchanged. The cardiac silhouette is enlarged, unchanged. The upper mediastinal contours are stable. Retrocardiac and patchy left suprahilar opacities are not significantly changed. There are probable trace bilateral pleural effusions. There is no overt pulmonary edema. There is no new or worsening focal airspace disease. Overall, aeration is not significantly changed. There is no pneumothorax. There is no acute osseous abnormality. IMPRESSION: Unchanged retrocardiac and left perihilar opacities and probable trace bilateral pleural effusions. Overall, no significant interval change since 11/18/2021. Electronically Signed   By: Valetta Mole M.D.   On: 11/19/2021 07:59   DG Chest Port 1 View  Result Date: 11/18/2021 CLINICAL DATA:  ETT EXAM: PORTABLE CHEST - 1 VIEW COMPARISON:  11/16/2021 FINDINGS: Unchanged cardiomegaly. Interval increase of left perihilar airspace opacity. No significant pulmonary vascular congestion. Postsurgical changes of CABG again seen. Left IJ central venous catheter unchanged in  position terminating at the confluence of the brachiocephalic veins. Right IJ sheath terminates in the region of the right brachiocephalic vein. IMPRESSION: 1. Interval worsening of left perihilar airspace opacity suspicious for developing pneumonia. 2. Unchanged cardiomegaly. Electronically Signed   By: Miachel Roux M.D.   On: 11/18/2021 07:34   DG Chest Port 1 View  Result Date: 11/16/2021 CLINICAL DATA:  Status post CABG. EXAM: PORTABLE CHEST 1 VIEW COMPARISON:  11/15/2021 FINDINGS: Right IJ Cordis is stable with tip projecting over the proximal SVC. There is a left IJ catheter with tip projecting over the mid SVC. Postoperative changes from median sternotomy and CABG procedure. Stable cardiac enlargement with pulmonary vascular congestion. No pneumothorax or airspace consolidation. IMPRESSION: Stable cardiac enlargement and pulmonary vascular congestion. Electronically Signed   By: Kerby Moors M.D.   On: 11/16/2021 09:54   DG Chest Port 1 View  Result Date: 11/15/2021 CLINICAL DATA:  Central line placement EXAM: PORTABLE CHEST 1 VIEW COMPARISON:  11/15/2021 FINDINGS: Single frontal view of the chest demonstrates interval removal of the enteric catheter. Right internal jugular Cordis is stable. Interval placement of a left internal jugular catheter tip overlying the superior vena cava. Postsurgical changes from prior CABG. Cardiac silhouette remains enlarged, with stable central vascular congestion. Lung volumes are diminished, without focal airspace disease, effusion, or pneumothorax. No acute bony abnormalities. IMPRESSION: 1. No complication after left internal jugular catheter placement. 2. Continued low lung volumes and central vascular congestion. Electronically Signed   By: Randa Ngo M.D.   On: 11/15/2021 16:28   DG Abd 2 Views  Result Date: 11/15/2021 CLINICAL DATA:  Ileus, abdominal distension EXAM: ABDOMEN - 2 VIEW COMPARISON:  Radiograph 11/14/2021 FINDINGS: Nasogastric tube tip and  side port overlie the stomach. Slight improvement in gaseous distension of bowel small measures up to 3.0 cm, previously up to 3.5 cm. No evidence of free intraperitoneal gas on upright x-ray. IMPRESSION: Slight improvement in bowel distention in comparison to prior exam. Electronically Signed   By: Maurine Simmering M.D.   On:  11/15/2021 10:43   DG CHEST PORT 1 VIEW  Result Date: 11/15/2021 CLINICAL DATA:  Pneumothorax. EXAM: PORTABLE CHEST 1 VIEW COMPARISON:  Nov 14, 2021. FINDINGS: Stable cardiomegaly. Status post coronary bypass graft. Nasogastric tube is seen entering stomach. Right internal jugular venous sheath is noted. Left-sided chest tube has been removed without pneumothorax. Bibasilar atelectasis is noted. Bony thorax is unremarkable. IMPRESSION: Left-sided chest tube has been removed without pneumothorax. Stable bibasilar atelectasis. Electronically Signed   By: Marijo Conception M.D.   On: 11/15/2021 08:48   ECHO INTRAOPERATIVE TEE  Result Date: 11/14/2021  *INTRAOPERATIVE TRANSESOPHAGEAL REPORT *  Patient Name:   Corderius Nier   Date of Exam: 11/12/2021 Medical Rec #:  [id number]      Height:       70.0 in Accession #:    [id number]     Weight:       280.2 lb Date of Birth:  07-22-1955     BSA:          2.41 m Patient Age:    69 years       BP:           125/72 mmHg Patient Gender: M              HR:           112 bpm. Exam Location:  Anesthesiology Transesophogeal exam was perform intraoperatively during surgical procedure. Patient was closely monitored under general anesthesia during the entirety of examination. Indications:     Coronary Artery Disease Sonographer:     Bernadene Person RDCS Performing Phys: [place] VANTRIGT Diagnosing Phys: Albertha Ghee MD Complications: No known complications during this procedure. POST-OP IMPRESSIONS Overall, there were no significant changes from pre-bypass. PRE-OP FINDINGS  Left Ventricle: The left ventricle has mildly reduced systolic function, with an  ejection fraction of 45-50%. The cavity size was normal. There is no increase in left ventricular wall thickness. Left ventrical global hypokinesis without regional wall motion abnormalities. There is no left ventricular hypertrophy. Right Ventricle: The right ventricle has normal systolic function. The cavity was normal. There is no increase in right ventricular wall thickness. Left Atrium: Left atrial size was normal in size. No left atrial/left atrial appendage thrombus was detected. Right Atrium: Right atrial size was normal in size. Interatrial Septum: No atrial level shunt detected by color flow Doppler. Pericardium: There is no evidence of pericardial effusion. Mitral Valve: The mitral valve is normal in structure. Mitral valve regurgitation is mild by color flow Doppler. There is No evidence of mitral stenosis. Tricuspid Valve: The tricuspid valve was normal in structure. Tricuspid valve regurgitation is mild by color flow Doppler. No evidence of tricuspid stenosis is present. Aortic Valve: The aortic valve is tricuspid Aortic valve regurgitation was not visualized by color flow Doppler. There is no stenosis of the aortic valve. Calcification is present on the aortic valve with no stenosis. Pulmonic Valve: The pulmonic valve was normal in structure. Pulmonic valve regurgitation is trivial by color flow Doppler. Aorta: The aortic root, ascending aorta and aortic arch are normal in size and structure. +-------------+-----------++ AORTIC VALVE             +-------------+-----------++ AV Vmax:     124.00 cm/s +-------------+-----------++ AV Vmean:    96.300 cm/s +-------------+-----------++ AV VTI:      0.256 m     +-------------+-----------++ AV Peak Grad:6.2 mmHg    +-------------+-----------++ AV Mean Grad:4.0 mmHg    +-------------+-----------++  Albertha Ghee MD  Electronically signed by Albertha Ghee MD Signature Date/Time: 11/14/2021/10:12:47 AM    Final    DG Abd Portable  1V  Result Date: 11/14/2021 CLINICAL DATA:  Abdominal distention, status post coronary artery bypass surgery EXAM: PORTABLE ABDOMEN - 1 VIEW COMPARISON:  11/13/2021 FINDINGS: Tip of enteric tube is seen in the antrum of the stomach. There is mild dilation of small-bowel loops measuring up to 3.5 cm in diameter. Gas and stool are noted in the colon. Stomach is not distended. IMPRESSION: Tip of enteric tube is seen in the antrum of the stomach. Mild dilation of small-bowel loops may suggest ileus. Electronically Signed   By: Elmer Picker M.D.   On: 11/14/2021 08:36   DG Chest Port 1 View  Result Date: 11/14/2021 CLINICAL DATA:  Status post coronary bypass surgery EXAM: PORTABLE CHEST 1 VIEW COMPARISON:  Previous studies including the examination of 11/13/2021 FINDINGS: Transverse diameter of heart is increased. Linear densities seen in the left lower lung fields. There are no signs of pulmonary edema. There is evidence of coronary bypass surgery. Left chest tube is noted. There is no pleural effusion or pneumothorax. Enteric tube is noted traversing the esophagus. There is interval removal of Swan-Ganz catheter. Right IJ vascular sheath is noted in place. IMPRESSION: Cardiomegaly. Linear densities in the left lower lung fields suggest subsegmental atelectasis. Electronically Signed   By: Elmer Picker M.D.   On: 11/14/2021 08:30   VAS [place] LOWER EXTREMITY VENOUS (DVT)  Result Date: 11/13/2021  Lower Venous DVT Study Patient Name:  [name] [name]  Date of Exam:   11/13/2021 Medical Rec #: [id number]     Accession #:    [id number] Date of Birth: Sep 17, 1955    Patient Gender: M Patient Age:   3 years Exam Location:  [facility] Procedure:      VAS [place] LOWER EXTREMITY VENOUS (DVT) Referring Phys: Ina Homes --------------------------------------------------------------------------------  Indications: Edema.  Risk Factors: Surgery S/P CABGx3 on 11/12/21. Limitations: Body habitus, poor  ultrasound/tissue interface and bandages. Comparison Study: No previous exams Performing Technologist: Jody Hill RVT, RDMS  Examination Guidelines: A complete evaluation includes B-mode imaging, spectral Doppler, color Doppler, and power Doppler as needed of all accessible portions of each vessel. Bilateral testing is considered an integral part of a complete examination. Limited examinations for reoccurring indications may be performed as noted. The reflux portion of the exam is performed with the patient in reverse Trendelenburg.  +--------+---------------+---------+-----------+----------+--------------------+ RIGHT   CompressibilityPhasicitySpontaneityPropertiesThrombus Aging       +--------+---------------+---------+-----------+----------+--------------------+ CFV                    Yes      Yes                  patent by                                                                 color/doppler        +--------+---------------+---------+-----------+----------+--------------------+ SFJ  patent by color      +--------+---------------+---------+-----------+----------+--------------------+ FV Prox Full           Yes      Yes                                       +--------+---------------+---------+-----------+----------+--------------------+ FV Mid  Full           Yes      Yes                                       +--------+---------------+---------+-----------+----------+--------------------+ FV      Full           Yes      Yes                                       Distal                                                                    +--------+---------------+---------+-----------+----------+--------------------+ PFV     Full                                                              +--------+---------------+---------+-----------+----------+--------------------+ POP     Full           Yes       Yes                                       +--------+---------------+---------+-----------+----------+--------------------+ PTV     Full                                                              +--------+---------------+---------+-----------+----------+--------------------+ PERO    Full                                                              +--------+---------------+---------+-----------+----------+--------------------+   Right Technical Findings: Very limited visualization of CFV & SFJ due to habitus. Limited visualzation of calf vessels due to bandage from vein harvest.  +--------+---------------+---------+-----------+----------+--------------------+ LEFT    CompressibilityPhasicitySpontaneityPropertiesThrombus Aging       +--------+---------------+---------+-----------+----------+--------------------+ CFV  Not visualized       +--------+---------------+---------+-----------+----------+--------------------+ SFJ                                                  Not visualized       +--------+---------------+---------+-----------+----------+--------------------+ FV Prox Full           Yes      Yes                                       +--------+---------------+---------+-----------+----------+--------------------+ FV Mid  Full           Yes      Yes                                       +--------+---------------+---------+-----------+----------+--------------------+ FV      Full           Yes      Yes                                       Distal                                                                    +--------+---------------+---------+-----------+----------+--------------------+ PFV                    Yes      Yes                  patent by                                                                 color/doppler         +--------+---------------+---------+-----------+----------+--------------------+ POP     Full           Yes      Yes                                       +--------+---------------+---------+-----------+----------+--------------------+ PTV     Full                                                              +--------+---------------+---------+-----------+----------+--------------------+ PERO    Full                                                              +--------+---------------+---------+-----------+----------+--------------------+  Left Technical Findings: Not visualized segments include CFV and SFJ (bandage in groin area).   Summary: BILATERAL: -No evidence of popliteal cyst, bilaterally. RIGHT: - There is no evidence of deep vein thrombosis in the lower extremity. However, portions of this examination were limited- see technologist comments above.  LEFT: - There is no evidence of deep vein thrombosis in the lower extremity. However, portions of this examination were limited- see technologist comments above.  *See table(s) above for measurements and observations. Electronically signed by Harold Barban MD on 11/13/2021 at 8:53:17 PM.    Final    DG Abd 1 View  Result Date: 11/13/2021 CLINICAL DATA:  NG tube placement EXAM: ABDOMEN - 1 VIEW COMPARISON:  11/13/2021 FINDINGS: Enteric tube tip is localized to the right upper quadrant consistent with location in the distal stomach. Gas-filled mildly distended small bowel as seen previously. IMPRESSION: Enteric tube tip is in the right upper quadrant consistent with location in the distal stomach. Electronically Signed   By: Lucienne Capers M.D.   On: 11/13/2021 18:04   DG Abd Portable 1V  Result Date: 11/13/2021 CLINICAL DATA:  Ileus EXAM: PORTABLE ABDOMEN - 1 VIEW COMPARISON:  None Available. FINDINGS: Gas in the stomach, small bowel, and colon. Mild bowel distension. No air in the rectum. Small amount of stool in the colon.  Negative skeletal structures. IMPRESSION: Mild bowel distension consistent with ileus. Electronically Signed   By: Franchot Gallo M.D.   On: 11/13/2021 17:30   ECHOCARDIOGRAM COMPLETE  Result Date: 11/13/2021    ECHOCARDIOGRAM REPORT   Patient Name:   [name] [name] Date of Exam: 11/13/2021 Medical Rec #:  [id number]    Height:       70.0 in Accession #:    [id number]   Weight:       295.6 lb Date of Birth:  10/21/1955   BSA:          2.464 m Patient Age:    63 years     BP:           126/62 mmHg Patient Gender: M            HR:           87 bpm. Exam Location:  Inpatient Procedure: 2D Echo, Cardiac Doppler, Color Doppler and Intracardiac            Opacification Agent Indications:    CHF  History:        Patient has prior history of Echocardiogram examinations, most                 recent 07/30/2021. CHF, Arrythmias:Bradycardia; Risk                 Factors:Hypertension, Dyslipidemia and Diabetes. S/p CABG x 3.  Sonographer:    Luisa Hart RDCS Referring Phys: 7 LINDSAY NICOLE [facility]  Sonographer Comments: Patient is morbidly obese and Technically difficult study due to poor echo windows. Image acquisition challenging due to patient body habitus. IMPRESSIONS  1. Left ventricular ejection fraction, by estimation, is 55 to 60%. The left ventricle has normal function. Left ventricular endocardial border not optimally defined to evaluate regional wall motion. Left ventricular diastolic parameters are consistent with Grade II diastolic dysfunction (pseudonormalization).  2. Peak RV-RA gradient 28 mmHg. Right ventricular systolic function was not well visualized. The right ventricular size is not well visualized.  3. The mitral valve was not well visualized. No evidence of mitral valve regurgitation. No evidence of mitral stenosis.  4. The aortic valve is tricuspid. There is mild calcification of the aortic valve. Aortic valve regurgitation is not visualized. Aortic valve sclerosis is present, with no evidence of  aortic valve stenosis.  5. No significant pericardial effusion.  6. The heart is poorly visualized. Grossly, LV systolic function appears to be in the normal range. FINDINGS  Left Ventricle: Left ventricular ejection fraction, by estimation, is 55 to 60%. The left ventricle has normal function. Left ventricular endocardial border not optimally defined to evaluate regional wall motion. The left ventricular internal cavity size was normal in size. There is no left ventricular hypertrophy. Left ventricular diastolic parameters are consistent with Grade II diastolic dysfunction (pseudonormalization). Right Ventricle: Peak RV-RA gradient 28 mmHg. The right ventricular size is not well visualized. Right vetricular wall thickness was not well visualized. Right ventricular systolic function was not well visualized. Left Atrium: Left atrial size was not well visualized. Right Atrium: Right atrial size was not well visualized. Pericardium: There is no evidence of pericardial effusion. Mitral Valve: The mitral valve was not well visualized. No evidence of mitral valve regurgitation. No evidence of mitral valve stenosis. MV peak gradient, 9.1 mmHg. The mean mitral valve gradient is 3.0 mmHg. Tricuspid Valve: The tricuspid valve is normal in structure. Tricuspid valve regurgitation is trivial. Aortic Valve: The aortic valve is tricuspid. There is mild calcification of the aortic valve. Aortic valve regurgitation is not visualized. Aortic valve sclerosis is present, with no evidence of aortic valve stenosis. Aortic valve mean gradient measures 2.5 mmHg. Aortic valve peak gradient measures 4.8 mmHg. Aortic valve area, by VTI measures 6.12 cm. Pulmonic Valve: The pulmonic valve was normal in structure. Pulmonic valve regurgitation is not visualized. Aorta: The aortic root is normal in size and structure. Venous: The inferior vena cava was not well visualized. IAS/Shunts: The interatrial septum was not well visualized.  LEFT  VENTRICLE PLAX 2D LVIDd:         5.10 cm   Diastology LVIDs:         4.00 cm   LV e' medial:    5.92 cm/s LV PW:         1.20 cm   LV E/e' medial:  27.2 LV IVS:        1.10 cm   LV e' lateral:   7.22 cm/s LVOT diam:     2.60 cm   LV E/e' lateral: 22.3 LV SV:         102 LV SV Index:   42 LVOT Area:     5.31 cm  RIGHT VENTRICLE RV S prime:     10.10 cm/s LEFT ATRIUM         Index LA diam:    2.70 cm 1.10 cm/m  AORTIC VALVE                    PULMONIC VALVE AV Area (Vmax):    5.36 cm     PV Vmax:       1.02 m/s AV Area (Vmean):   5.17 cm     PV Vmean:      67.500 cm/s AV Area (VTI):     6.12 cm     PV VTI:        0.144 m AV Vmax:           109.00 cm/s  PV Peak grad:  4.2 mmHg AV Vmean:          74.650 cm/s  PV Mean grad:  2.0  mmHg AV VTI:            0.168 m AV Peak Grad:      4.8 mmHg AV Mean Grad:      2.5 mmHg LVOT Vmax:         110.00 cm/s LVOT Vmean:        72.700 cm/s LVOT VTI:          0.193 m LVOT/AV VTI ratio: 1.15  AORTA Ao Root diam: 3.70 cm Ao Asc diam:  3.60 cm MITRAL VALVE                TRICUSPID VALVE MV Area (PHT): 3.91 cm     TR Peak grad:   28.5 mmHg MV Area VTI:   2.44 cm     TR Vmax:        267.00 cm/s MV Peak grad:  9.1 mmHg MV Mean grad:  3.0 mmHg     SHUNTS MV Vmax:       1.51 m/s     Systemic VTI:  0.19 m MV Vmean:      80.3 cm/s    Systemic Diam: 2.60 cm MV Decel Time: 194 msec MV E velocity: 161.00 cm/s MV A velocity: 52.60 cm/s MV E/A ratio:  3.06 [place] Electronically signed by Franki Monte Signature Date/Time: 11/13/2021/3:42:40 PM    Final    DG Chest Port 1 View  Result Date: 11/13/2021 CLINICAL DATA:  Status post CABG, dyspnea EXAM: PORTABLE CHEST 1 VIEW COMPARISON:  Chest radiograph from one day prior. FINDINGS: Interval extubation and removal of enteric tube. Stable right internal jugular Swan-Ganz catheter terminating over the main pulmonary artery. Intact sternotomy wires. Stable mid to upper left chest tube. Stable cardiomediastinal silhouette with mild to  moderate cardiomegaly. No pneumothorax. No pleural effusion. Cephalization of the pulmonary vasculature without overt pulmonary edema. Mild left retrocardiac atelectasis, similar. IMPRESSION: 1. No pneumothorax. 2. Stable mild to moderate cardiomegaly without overt pulmonary edema. 3. Stable mild left retrocardiac atelectasis. Electronically Signed   By: Ilona Sorrel M.D.   On: 11/13/2021 08:09   DG Chest Port 1 View  Result Date: 11/12/2021 CLINICAL DATA:  Status post CABG. EXAM: PORTABLE CHEST 1 VIEW COMPARISON:  Nov 08, 2021 FINDINGS: Surgical changes of median sternotomy and CABG. Right IJ Swan-Ganz catheter with tip projecting over the main pulmonary artery. Endotracheal tube with tip projecting over the midthoracic trachea. Enteric tube courses below the diaphragm with tip obscured by collimation. Mild cardiac enlargement at least somewhat accentuated by technique. Low lung volumes with dependent atelectasis. No visible pleural effusion or pneumothorax. IMPRESSION: 1. Postsurgical change of median sternotomy and CABG with low lung volumes and dependent atelectasis. 2. Mild cardiac enlargement. No overt pulmonary edema or pleural effusions. Electronically Signed   By: Dahlia Bailiff M.D.   On: 11/12/2021 15:16   DG Chest 2 View  Result Date: 11/09/2021 CLINICAL DATA:  Preop chest radiograph. EXAM: CHEST - 2 VIEW COMPARISON:  Cardiac CT dated 10/18/2021. FINDINGS: No focal consolidation, pleural effusion or pneumothorax. The cardiac silhouette is within limits. Atherosclerotic calcification of the aortic arch. No acute osseous pathology. IMPRESSION: No active cardiopulmonary disease. Electronically Signed   By: Anner Crete M.D.   On: 11/09/2021 22:51   VAS US DOPPLER PRE CABG  Result Date: 11/08/2021 PREOPERATIVE VASCULAR EVALUATION Patient Name:  Ulis Rudman  Date of Exam:   11/08/2021 Medical Rec #: [id number]     Accession #:    [id number] Date of Birth: [birth year]  Patient Gender: M Patient  Age:   50 years Exam Location:  [facility] Procedure:      VAS US DOPPLER PRE CABG Referring Phys: Collier Salina VANTRIGT --------------------------------------------------------------------------------  Indications:      Pre-CABG. Risk Factors:     Hypertension, hyperlipidemia, Diabetes. Limitations:      A-fib Comparison Study: No prior study Performing Technologist: Maudry Mayhew MHA, RVT, RDCS, RDMS  Examination Guidelines: A complete evaluation includes B-mode imaging, spectral Doppler, color Doppler, and power Doppler as needed of all accessible portions of each vessel. Bilateral testing is considered an integral part of a complete examination. Limited examinations for reoccurring indications may be performed as noted.  Right Carotid Findings: +---------+--------+-------+--------+---------------------------------+--------+          PSV cm/sEDV    StenosisDescribe                         Comments                  cm/s                                                     +---------+--------+-------+--------+---------------------------------+--------+ CCA Prox 88      14             smooth and homogeneous                    +---------+--------+-------+--------+---------------------------------+--------+ CCA      83      13                                                       Distal                                                                    +---------+--------+-------+--------+---------------------------------+--------+ ICA Prox 114     19             heterogenous, calcific and                                                irregular                                 +---------+--------+-------+--------+---------------------------------+--------+ ICA      68      20                                                       Distal                                                                     +---------+--------+-------+--------+---------------------------------+--------+  ECA      93                                                               +---------+--------+-------+--------+---------------------------------+--------+ +----------+--------+-------+----------------+------------+           PSV cm/sEDV cmsDescribe        Arm Pressure +----------+--------+-------+----------------+------------+ [id number]            Multiphasic, WNL             +----------+--------+-------+----------------+------------+ +---------+--------+--+--------+-+---------+ VertebralPSV cm/s38EDV cm/s9Antegrade +---------+--------+--+--------+-+---------+ Left Carotid Findings: +----------+--------+--------+--------+-------------------------------+--------+           PSV cm/sEDV cm/sStenosisDescribe                       Comments +----------+--------+--------+--------+-------------------------------+--------+ CCA Prox  119     19                                                      +----------+--------+--------+--------+-------------------------------+--------+ CCA Mid   128     21                                                      +----------+--------+--------+--------+-------------------------------+--------+ CCA Distal104     13              smooth and heterogenous                 +----------+--------+--------+--------+-------------------------------+--------+ ICA Prox  73      13              heterogenous, irregular and                                               hyperechoic                             +----------+--------+--------+--------+-------------------------------+--------+ ICA Distal72      22                                                      +----------+--------+--------+--------+-------------------------------+--------+ ECA       85      8                                                        +----------+--------+--------+--------+-------------------------------+--------+ +----------+--------+--------+----------------+------------+ SubclavianPSV cm/sEDV cm/sDescribe        Arm Pressure +----------+--------+--------+----------------+------------+           158  Multiphasic, WNL             +----------+--------+--------+----------------+------------+ +---------+--------+--+--------+--+---------+ VertebralPSV cm/s41EDV cm/s13Antegrade +---------+--------+--+--------+--+---------+  ABI Findings: +---------+------------------+-----+----------+--------+ Right    Rt Pressure (mmHg)IndexWaveform  Comment  +---------+------------------+-----+----------+--------+ Brachial 97                     triphasic          +---------+------------------+-----+----------+--------+ PTA      111               0.89 monophasic         +---------+------------------+-----+----------+--------+ DP       151               1.21 monophasic         +---------+------------------+-----+----------+--------+ Great Toe80                0.64                    +---------+------------------+-----+----------+--------+ +---------+------------------+-----+----------+-------+ Left     Lt Pressure (mmHg)IndexWaveform  Comment +---------+------------------+-----+----------+-------+ Brachial 125                    triphasic         +---------+------------------+-----+----------+-------+ PTA      175               1.40 monophasic        +---------+------------------+-----+----------+-------+ DP       128               1.02 monophasic        +---------+------------------+-----+----------+-------+ Great Toe58                0.46                   +---------+------------------+-----+----------+-------+ +-------+---------------+----------------+ ABI/TBIToday's ABI/TBIPrevious ABI/TBI +-------+---------------+----------------+ Right  1.21/0.64                        +-------+---------------+----------------+ Left   1.40/0.46                       +-------+---------------+----------------+  Right Doppler Findings: +-----------+--------+-----+---------+-----------------------------------------+ Site       PressureIndexDoppler  Comments                                  +-----------+--------+-----+---------+-----------------------------------------+ Brachial   97           triphasic                                          +-----------+--------+-----+---------+-----------------------------------------+ Radial                  triphasic                                          +-----------+--------+-----+---------+-----------------------------------------+ Ulnar                   triphasic                                          +-----------+--------+-----+---------+-----------------------------------------+  Palmar Arch                      Signal obliterates with radial                                             compression, is unaffected with ulnar                                      compression.                              +-----------+--------+-----+---------+-----------------------------------------+  Left Doppler Findings: +-----------+--------+-----+---------+-----------------------------------------+ Site       PressureIndexDoppler  Comments                                  +-----------+--------+-----+---------+-----------------------------------------+ Brachial   125          triphasic                                          +-----------+--------+-----+---------+-----------------------------------------+ Radial                  triphasic                                          +-----------+--------+-----+---------+-----------------------------------------+ Ulnar                   triphasic                                           +-----------+--------+-----+---------+-----------------------------------------+ Palmar Arch                      Signal obliterates with radial                                             compression, decreases >50% with ulnar                                     compression                               +-----------+--------+-----+---------+-----------------------------------------+  Summary: Right Carotid: Velocities in the right ICA are consistent with a 1-39% stenosis. Left Carotid: Velocities in the left ICA are consistent with a 1-39% stenosis. Vertebrals:  Bilateral vertebral arteries demonstrate antegrade flow. Subclavians: Normal flow hemodynamics were seen in bilateral subclavian              arteries. Right ABI: Resting right ankle-brachial index is within normal limits, however give abnormal waveforms this is likely falsely elevated, suggestive of medial arterial calcification  of the right lower extremity arteries. The right toe-brachial index is abnormal. Left ABI: Resting left ankle-brachial index indicates noncompressible left lower extremity arteries, suggestive of medial arterial calcification. The left toe-brachial index is abnormal. Right Upper Extremity: Doppler waveform obliterate with right radial compression. Doppler waveforms remain within normal limits with right ulnar compression. Left Upper Extremity: Doppler waveform obliterate with left radial compression. Doppler waveforms decrease >50% with left ulnar compression.  Electronically signed by Jamelle Haring on 11/08/2021 at 5:26:46 PM.    Final     Labs:  CBC: Recent Labs    11/26/21 0436 11/26/21 1513 11/27/21 0145 11/28/21 0454  WBC 25.2*  25.4* 25.2* 24.5* 17.5*  HGB 7.6*  7.6* 8.5* 8.7* 8.1*  HCT 24.0*  23.8* 26.2* 27.7* 25.4*  PLT 293  287 293 309 316    COAGS: Recent Labs    11/08/21 1047 11/12/21 1504 11/25/21 0358 11/27/21 0145 11/28/21 1407  INR 0.9 1.4* 1.4* 1.3* 1.3*  APTT 26 27  --    --   --     BMP: Recent Labs    11/25/21 2210 11/26/21 0436 11/27/21 0145 11/28/21 0454  NA 127* 130* 131* 130*  K 3.6 4.1 4.3 3.9  CL 88* 91* 93* 93*  CO2 22 23 20* 25  GLUCOSE 201* 241* 243* 235*  BUN 31* 35* 52* 39*  CALCIUM 7.9* 8.2* 8.4* 8.0*  CREATININE 4.01* 4.92* 6.66* 5.35*  GFRNONAA 16* 12* 8* 11*    LIVER FUNCTION TESTS: Recent Labs    11/15/21 0359 11/15/21 1547 11/16/21 0418 11/16/21 1600 11/17/21 0436 11/17/21 1600 11/18/21 0408 11/18/21 1628 11/22/21 2239 11/23/21 0434 11/25/21 2210 11/26/21 0436 11/27/21 0145 11/28/21 0454  BILITOT 0.6  --  0.9  --  0.8  --  0.8  --   --   --   --   --   --   --   AST 24  --  29  --  36  --  26  --   --   --   --   --   --   --   ALT 7  --  8  --  8  --  9  --   --   --   --   --   --   --   ALKPHOS 39  --  46  --  48  --  51  --   --   --   --   --   --   --   PROT 6.2*  --  6.3*  --  6.1*  --  6.5  --  6.6  --   --   --   --   --   ALBUMIN 3.5   < > 3.3*  3.4*   < > 3.1*  3.1*   < > 2.9*  3.0*   < >  --    < > 2.8* 2.5* 2.6* 2.2*   < > = values in this interval not displayed.    TUMOR MARKERS: No results for input(s): "AFPTM", "CEA", "CA199", "CHROMGRNA" in the last 8760 hours.  Assessment and Plan:  Acute on chronic renal failure.  Will proceed with image guided placement of a tunneled HD catheter today.  Risks and benefits discussed with the patient including, but not limited to bleeding, infection, vascular injury, pneumothorax which may require chest tube placement, air embolism or even death  All of the patient's questions were answered, patient is agreeable to  proceed. Consent signed and in chart.  Thank you for allowing our service to participate in Saxton Chain 's care.  Electronically Signed: Murrell Redden, PA-C   11/28/2021, 4:30 PM      I spent a total of  15 Minutes in face to face in clinical consultation, greater than 50% of which was counseling/coordinating care for HD cath  placement.

## 2021-11-28 NOTE — Progress Notes (Signed)
Patient ID: [name] [name], male   DOB: May 22, 1956, 66 y.o.   MRN: [id number] S: Finished HD late last night and didn't sleep well. O:BP (!) 102/58   Pulse 84   Temp (!) 96.6 F (35.9 C) (Axillary)   Resp 13   Ht 5\' 10"  (1.778 m)   Wt 120.9 kg   SpO2 94%   BMI 38.24 kg/m   Intake/Output Summary (Last 24 hours) at 11/28/2021 1149 Last data filed at 11/28/2021 0800 Gross per 24 hour  Intake 780.25 ml  Output 2500 ml  Net -1719.75 ml   Intake/Output: I/O last 3 completed shifts: In: 1111.3 [P.O.:480; I.V.:432.3; IV Piggyback:199] Out: 2555 [Other:2555]  Intake/Output this shift:  Total I/O In: 155.6 [P.O.:120; I.V.:35.6] Out: -  Weight change: 0 kg Gen:NAD CVS: RRR Resp: CTA Abd: +BS, soft, NT?nd Ext: trace edema  Recent Labs  Lab 11/24/21 0455 11/24/21 1543 11/25/21 0358 11/25/21 2210 11/26/21 0436 11/27/21 0145 11/28/21 0454  NA 134* 133* 130* 127* 130* 131* 130*  K 3.8 4.3 4.6 3.6 4.1 4.3 3.9  CL 94* 94* 94* 88* 91* 93* 93*  CO2 27 24 21* 22 23 20* 25  GLUCOSE 112* 126* 187* 201* 241* 243* 235*  BUN 33* 46* 52* 31* 35* 52* 39*  CREATININE 3.38* 4.99* 6.09* 4.01* 4.92* 6.66* 5.35*  ALBUMIN 3.4* 3.1* 2.9* 2.8* 2.5* 2.6* 2.2*  CALCIUM 8.6* 8.7* 8.5* 7.9* 8.2* 8.4* 8.0*  PHOS 3.3 5.2* 6.0* 3.9 5.2* 7.8* 5.8*   Liver Function Tests: Recent Labs  Lab 11/22/21 2239 11/23/21 0434 11/26/21 0436 11/27/21 0145 11/28/21 0454  PROT 6.6  --   --   --   --   ALBUMIN  --    < > 2.5* 2.6* 2.2*   < > = values in this interval not displayed.   No results for input(s): "LIPASE", "AMYLASE" in the last 168 hours. No results for input(s): "AMMONIA" in the last 168 hours. CBC: Recent Labs  Lab 11/25/21 0358 11/26/21 0436 11/26/21 1513 11/27/21 0145 11/28/21 0454  WBC 31.5* 25.2*  25.4* 25.2* 24.5* 17.5*  NEUTROABS  --  20.1*  --   --   --   HGB 7.4* 7.6*  7.6* 8.5* 8.7* 8.1*  HCT 23.1* 24.0*  23.8* 26.2* 27.7* 25.4*  MCV 93.1 94.9  93.0 91.3 94.2 92.7  PLT 310  293  287 293 309 316   Cardiac Enzymes: No results for input(s): "CKTOTAL", "CKMB", "CKMBINDEX", "TROPONINI" in the last 168 hours. CBG: Recent Labs  Lab 11/27/21 1548 11/27/21 2031 11/28/21 0111 11/28/21 0531 11/28/21 1104  GLUCAP 198* 150* 241* 220* 247*    Iron Studies: No results for input(s): "IRON", "TIBC", "TRANSFERRIN", "FERRITIN" in the last 72 hours. Studies/Results: DG Chest Port 1 View  Result Date: 11/28/2021 CLINICAL DATA:  History of CABG. EXAM: PORTABLE CHEST 1 VIEW COMPARISON:  AP chest 11/26/2021 FINDINGS: Status post median sternotomy and CABG. Cardiac silhouette is again at least moderately enlarged. Mediastinal contours are within normal limits. Calcification is again seen within the aortic arch. Interval removal of right upper extremity PICC. Redemonstration of left internal jugular central venous catheter sheath with associated catheter tip overlying the superior vena cava, unchanged. Mild left interstitial thickening is similar to prior. Left basilar heterogeneous opacification again likely corresponding to the loculated left posterior pleural effusion and associated atelectasis better seen on prior CT. No pneumothorax. Mild multilevel degenerative disc changes of the thoracic spine. IMPRESSION: 1. Interval removal of right upper extremity PICC. 2. Stable  enlarged cardiac silhouette. 3. Mild left interstitial thickening and basilar heterogeneous opacification are not significantly changed, corresponding to the loculated left basilar pleural effusion better seen on prior CT. Electronically Signed   By: Yvonne Kendall M.D.   On: 11/28/2021 08:22   CT ASPIRATION  Result Date: 11/27/2021 INDICATION: Recent CABG, now with sepsis of uncertain etiology. Please perform image guided aspiration and/or drainage catheter placement of left-sided retroperitoneal hematoma given concern for superinfection. Additionally, please perform image guided thoracentesis as indicated. EXAM:  CT-GUIDED LEFT-SIDED RETROPERITONEAL HEMATOMA ASPIRATION COMPARISON:  CT abdomen and pelvis-11/25/2021 MEDICATIONS: The patient is currently admitted to the hospital and receiving intravenous antibiotics. The antibiotics were administered within an appropriate time frame prior to the initiation of the procedure. ANESTHESIA/SEDATION: Moderate (conscious) sedation was employed during this procedure. A total of Versed 1 mg and Fentanyl 50 mcg was administered intravenously. Moderate Sedation Time: 18 minutes. The patient's level of consciousness and vital signs were monitored continuously by radiology nursing throughout the procedure under my direct supervision. CONTRAST:  None COMPLICATIONS: None immediate. PROCEDURE: RADIATION DOSE REDUCTION: This exam was performed according to the departmental dose-optimization program which includes automated exposure control, adjustment of the mA and/or kV according to patient size and/or use of iterative reconstruction technique. Informed written consent was obtained from the patient after a discussion of the risks, benefits and alternatives to treatment. The patient was placed supine, slightly RPO on the CT gantry and a pre procedural CT was performed re-demonstrating the known left-sided retroperitoneal hematoma with dominant component measuring approximately 6.6 x 5.9 cm (image 60, series 2). Dedicated imaging of the lower thorax demonstrates a unchanged small/trace left-sided pleural effusion, too small to allow for safe ultrasound-guided thoracentesis. The procedure was planned. A timeout was performed prior to the initiation of the procedure. The skin overlying the lateral aspect of the left mid abdomen was prepped and draped in the usual sterile fashion. The overlying soft tissues were anesthetized with 1% lidocaine with epinephrine. Appropriate trajectory was planned with the use of a 22 gauge spinal needle. An 18 gauge trocar needle was advanced into the abscess/fluid  collection and a short Amplatz super stiff wire was coiled within the collection. Appropriate positioning was confirmed with a limited CT scan. The tract was serially dilated allowing placement of a 10 [place] all-purpose drainage catheter. Imaging was performed demonstrating the drainage catheter coiled within the posterolateral aspect of the left-sided retroperitoneal hematoma. From this location, approximally 30 cc of thick, bloody fluid was aspirated and a drainage catheter was removed. Given residual amount of hematoma, the more central and more hypoattenuating and thus potentially liquified component of the hematoma was targeted with an 18 gauge trocar needle. A short Amplatz wire was coiled within the more central aspect of the collection and a 10 French percutaneous drainage catheter was coiled and locked within the collection. An additional approximately 25 cc of similar appearing thick, bloody fluid was aspirated from the left-sided retroperitoneal hematoma. The external portion of the drainage catheter was cut and drainage catheter was removed intact. A dressing was applied. The patient tolerated the procedure well without immediate postprocedural complication. IMPRESSION: 1. Successful CT guided aspiration of a total of 55 cc of thick, bloody fluid from left-sided retroperitoneal hematoma. As the collection is favored to represent a sterile non infected hematoma, a drainage catheter was not left in place. All aspirated fluid was capped and sent to the laboratory for analysis. 2. Small/trace partially loculated left-sided pleural effusion, similar to the 11/25/2021 examination  and too small to allow for safe image guided thoracentesis. Electronically Signed   By: Sandi Mariscal M.D.   On: 11/27/2021 14:30    aspirin EC  325 mg Oral Daily   Or   aspirin  324 mg Per Tube Daily   bisacodyl  10 mg Rectal Q0600   Chlorhexidine Gluconate Cloth  6 each Topical Daily   darbepoetin (ARANESP) injection - DIALYSIS   100 mcg Intravenous Q Mon-HD   docusate sodium  200 mg Oral Daily   guaiFENesin  1,200 mg Oral BID   insulin aspart  0-24 Units Subcutaneous TID WC   insulin detemir  24 Units Subcutaneous Q12H   lactulose  10 g Oral Daily   lidocaine  1 patch Transdermal Q24H   metoCLOPramide (REGLAN) injection  5 mg Intravenous Q8H   midodrine  15 mg Oral TID WC   multivitamin  1 tablet Oral QHS   pantoprazole  40 mg Oral Daily   rosuvastatin  20 mg Oral Daily   sodium chloride flush  3 mL Intravenous Q12H    BMET    Component Value Date/Time   NA 130 (L) 11/28/2021 0454   NA 139 10/21/2021 1547   K 3.9 11/28/2021 0454   CL 93 (L) 11/28/2021 0454   CO2 25 11/28/2021 0454   GLUCOSE 235 (H) 11/28/2021 0454   BUN 39 (H) 11/28/2021 0454   BUN 29 (H) 10/21/2021 1547   CREATININE 5.35 (H) 11/28/2021 0454   CALCIUM 8.0 (L) 11/28/2021 0454   GFRNONAA 11 (L) 11/28/2021 0454   GFRAA 42 (L) 10/18/2019 1006   CBC    Component Value Date/Time   WBC 17.5 (H) 11/28/2021 0454   RBC 2.74 (L) 11/28/2021 0454   HGB 8.1 (L) 11/28/2021 0454   HGB 13.4 10/21/2021 1547   HCT 25.4 (L) 11/28/2021 0454   HCT 40.8 10/21/2021 1547   PLT 316 11/28/2021 0454   PLT 246 10/21/2021 1547   MCV 92.7 11/28/2021 0454   MCV 91 10/21/2021 1547   MCH 29.6 11/28/2021 0454   MCHC 31.9 11/28/2021 0454   RDW 15.9 (H) 11/28/2021 0454   RDW 14.6 10/21/2021 1547   LYMPHSABS 1.4 11/26/2021 0436   MONOABS 2.8 (H) 11/26/2021 0436   EOSABS 0.3 11/26/2021 0436   BASOSABS 0.1 11/26/2021 0436    Assessment/Plan: Acute kidney injury on chronic kidney disease stage IIIa: -AKI likely ischemic ATN in the setting of septic versus cardiogenic shock.  Started on CRRT 5/26 for extracorporeal volume unloading after he had fixed urine output on Lasix drip that was inadequate for volume unloading. CRRT 5/26-5/31.   New temp HD catheter placed 11/21/21 and has had IHD on 6/3 and 11/25/21.  Will continue with MWF schedule. WBC improving will  consult IR for [facility] placement.  no evidence of renal recovery at this junction. Will need outpatient HD to be arranged  Coronary artery disease status post three-vessel CABG: -on aspirin/statin. Left hip/back pain - CT scan with left psoas muscle hematoma vs abscess.  Given leukocytosis, may need IR for aspiration and cultures. Acute on chronic dCHF -UF as tolerated with HD, volume status improving -milrinone stopped 5/29 Shock -possibly from sepsis+cardiogenic. Abx/pressor support per primary service. Sepsis w/u per CCM/  -off pressors and on midodrine now Leukocytosis - now on Unasyn to cover possible aspiration PNA. Atrial fibrillation: -Status post MAZE procedure at the time of CABG. Per primary service, on amio. S/p tee-dccv 6/1.  S/p a successful cardioversion 11/25/21.  Anemia: -  Likely associated with recent surgery and postoperative inflammatory response.  transfuse PRN for hgb <7-iron panel=iron deficient but will hold off on adding Fe given that he is receiving abx and there is no clearcut source of infection. ESA started 6/5 Hyponatremia -Managing with HD and UF, improved Left Pleural Effusion -s/p thora 6/2, 900cc removed  Donetta Potts, MD [facility]

## 2021-11-28 NOTE — Progress Notes (Signed)
New Dialysis Start   Patient identified as new dialysis start. Kidney Education packet assembled and given. Discussed the following items with patient:    Current medications and possible changes once started:  Discussed that patient's medications may change over time.  Ex; hypertension medications and diabetes medication.  Nephrologists will adjust as needed.  Fluid restrictions reviewed:  32 oz daily goal:  All liquids count; soups, ice, jello   Phosphorus and potassium: Handout given showing high potassium and phosphorus foods.  Alternative food and drink options given.    Family support:  Wife present at bedside and participated in education.  Outpatient Clinic Resources:  Discussed roles of Outpatient clinic  staff and advised to make a list of needs, if any, to talk with outpatient staff if needed  Care plan schedule: Informed patient and family member of Care Plans in outpatient setting and to participate in the care plan.  An invitation would be given from outpatient clinic.   Dialysis Access Options:  Reviewed access options with patients. Discussed in detail about care at home with new AVG & AVF. Reviewed checking bruit and thrill. If dialysis catheter present, educated that patient could not take showers.  Catheter dressing changes were to be done by outpatient clinic staff only  Home therapy options:  Educated patient about home therapy options:  PD vs home hemo.  Patient stated he is not interested at this time in home therapies.   Patient verbalized understanding. Will continue to round on patient during admission.    Lilia Argue, RN

## 2021-11-29 ENCOUNTER — Other Ambulatory Visit (HOSPITAL_COMMUNITY): Payer: Self-pay

## 2021-11-29 ENCOUNTER — Encounter (HOSPITAL_COMMUNITY)
Admission: RE | Disposition: A | Discharge status: Home / Self Care | Payer: Self-pay | Source: Home / Self Care | Attending: Cardiothoracic Surgery

## 2021-11-29 ENCOUNTER — Inpatient Hospital Stay (HOSPITAL_COMMUNITY): Payer: Medicare HMO | Admitting: Anesthesiology

## 2021-11-29 ENCOUNTER — Encounter (HOSPITAL_COMMUNITY): Payer: Self-pay | Admitting: Cardiothoracic Surgery

## 2021-11-29 ENCOUNTER — Inpatient Hospital Stay (HOSPITAL_COMMUNITY): Payer: Medicare HMO

## 2021-11-29 DIAGNOSIS — I4891 Unspecified atrial fibrillation: Secondary | ICD-10-CM

## 2021-11-29 DIAGNOSIS — I2510 Atherosclerotic heart disease of native coronary artery without angina pectoris: Secondary | ICD-10-CM | POA: Diagnosis not present

## 2021-11-29 DIAGNOSIS — I509 Heart failure, unspecified: Secondary | ICD-10-CM | POA: Diagnosis not present

## 2021-11-29 DIAGNOSIS — Z951 Presence of aortocoronary bypass graft: Secondary | ICD-10-CM | POA: Diagnosis not present

## 2021-11-29 DIAGNOSIS — I110 Hypertensive heart disease with heart failure: Secondary | ICD-10-CM

## 2021-11-29 DIAGNOSIS — N179 Acute kidney failure, unspecified: Secondary | ICD-10-CM | POA: Diagnosis not present

## 2021-11-29 HISTORY — PX: CARDIOVERSION: SHX1299

## 2021-11-29 LAB — RENAL FUNCTION PANEL
Albumin: 2.3 g/dL — ABNORMAL LOW (ref 3.5–5.0)
Anion gap: 15 (ref 5–15)
BUN: 63 mg/dL — ABNORMAL HIGH (ref 8–23)
CO2: 22 mmol/L (ref 22–32)
Calcium: 8.1 mg/dL — ABNORMAL LOW (ref 8.9–10.3)
Chloride: 93 mmol/L — ABNORMAL LOW (ref 98–111)
Creatinine, Ser: 7.44 mg/dL — ABNORMAL HIGH (ref 0.61–1.24)
GFR, Estimated: 7 mL/min — ABNORMAL LOW (ref >60)
Glucose, Bld: 187 mg/dL — ABNORMAL HIGH (ref 70–99)
Phosphorus: 6.9 mg/dL — ABNORMAL HIGH (ref 2.5–4.6)
Potassium: 4 mmol/L (ref 3.5–5.1)
Sodium: 130 mmol/L — ABNORMAL LOW (ref 135–145)

## 2021-11-29 LAB — CBC
HCT: 25.9 % — ABNORMAL LOW (ref 39.0–52.0)
Hemoglobin: 8.4 g/dL — ABNORMAL LOW (ref 13.0–17.0)
MCH: 30 pg (ref 26.0–34.0)
MCHC: 32.4 g/dL (ref 30.0–36.0)
MCV: 92.5 fL (ref 80.0–100.0)
Platelets: 352 10*3/uL (ref 150–400)
RBC: 2.8 MIL/uL — ABNORMAL LOW (ref 4.22–5.81)
RDW: 15.9 % — ABNORMAL HIGH (ref 11.5–15.5)
WBC: 17.8 10*3/uL — ABNORMAL HIGH (ref 4.0–10.5)
nRBC: 0 % (ref 0.0–0.2)

## 2021-11-29 LAB — HEPARIN LEVEL (UNFRACTIONATED)
Heparin Unfractionated: 0.2 IU/mL — ABNORMAL LOW (ref 0.30–0.70)
Heparin Unfractionated: 0.21 IU/mL — ABNORMAL LOW (ref 0.30–0.70)

## 2021-11-29 LAB — GLUCOSE, CAPILLARY
Glucose-Capillary: 205 mg/dL — ABNORMAL HIGH (ref 70–99)
Glucose-Capillary: 324 mg/dL — ABNORMAL HIGH (ref 70–99)
Glucose-Capillary: 375 mg/dL — ABNORMAL HIGH (ref 70–99)
Glucose-Capillary: 134 mg/dL — ABNORMAL HIGH (ref 70–99)
Glucose-Capillary: 196 mg/dL — ABNORMAL HIGH (ref 70–99)

## 2021-11-29 LAB — MAGNESIUM: Magnesium: 2.1 mg/dL (ref 1.7–2.4)

## 2021-11-29 SURGERY — CARDIOVERSION
Anesthesia: General

## 2021-11-29 MED ORDER — LIDOCAINE 2% (20 MG/ML) 5 ML SYRINGE
INTRAMUSCULAR | Status: DC | PRN
  Administered 2021-11-29: 100 mg via INTRAVENOUS

## 2021-11-29 MED ORDER — APIXABAN 5 MG PO TABS
5.0000 mg | ORAL_TABLET | Freq: Two times a day (BID) | ORAL | Status: DC
  Administered 2021-11-30 – 2021-12-04 (×8): 5 mg via ORAL
  Filled 2021-11-29 (×8): qty 1

## 2021-11-29 MED ORDER — INSULIN DETEMIR 100 UNIT/ML ~~LOC~~ SOLN
22.0000 [IU] | Freq: Two times a day (BID) | SUBCUTANEOUS | Status: DC
  Administered 2021-11-29 (×2): 22 [IU] via SUBCUTANEOUS
  Filled 2021-11-29 (×5): qty 0.22

## 2021-11-29 MED ORDER — PROPOFOL 10 MG/ML IV BOLUS
INTRAVENOUS | Status: DC | PRN
  Administered 2021-11-29: 60 mg via INTRAVENOUS

## 2021-11-29 MED ORDER — SODIUM CHLORIDE 0.9 % IV SOLN: INTRAVENOUS | Status: DC

## 2021-11-29 MED ORDER — ASPIRIN 81 MG PO TBEC
81.0000 mg | DELAYED_RELEASE_TABLET | Freq: Every day | ORAL | Status: DC
  Administered 2021-11-30 – 2021-12-04 (×5): 81 mg via ORAL
  Filled 2021-11-29 (×5): qty 1

## 2021-11-29 MED ORDER — MIDODRINE HCL 5 MG PO TABS
10.0000 mg | ORAL_TABLET | Freq: Three times a day (TID) | ORAL | Status: DC
  Administered 2021-11-29 – 2021-12-01 (×7): 10 mg via ORAL
  Filled 2021-11-29 (×10): qty 2

## 2021-11-29 NOTE — Progress Notes (Signed)
Nutrition Follow-up  DOCUMENTATION CODES:   Not applicable  INTERVENTION:  Liberalize diet from a heart healthy/carb modified to a carb modified diet to provide widest variety of menu options to enhance nutritional adequacy Double protein with meals Renal MVI with minerals daily Provided handout on "Nutrition for People on Dialysis"  NUTRITION DIAGNOSIS:   Increased nutrient needs related to acute illness as evidenced by estimated needs.  Ongoing  GOAL:   Patient will meet greater than or equal to 90% of their needs  Needs being addressed via double protein and liberalized diet  MONITOR:   PO intake, Labs, Weight trends  REASON FOR ASSESSMENT:   Consult Diet education (New HD)  ASSESSMENT:   66 yo male admitted for elective CABG x 3; developed respiratory distress, AKI on CKD requiring CRRT, post op ileus. PMH includes CAD, CHF, CKD, DM, HTN, OSA  5/23 CABG x 3 with MAZE 5/26 CRRT initiated 5/31 CRRT discontinued 6/01 1st iHD, Cardioversion 6/02 Thoracentesis with 900 mL removed 6/03 2nd iHD 6/05 3rd iHD; CT chest/abdomen/pelvis due to increasing WBC: Asymmetric enlargement of the psoas muscle on the left containing a hypodense region with surrounding hyperdense material within the muscle and in the retroperitoneum on the left, possible hemorrhage versus abscess. Small hyperdense pericardial effusion, possibly reflecting blood products versus infection.  06/07: iHD--net UF 2529ml 06/08: s/p RIJ tunneled HD cath placement  Pt is in good spirits. Reports eating well and states that he is "eating too much." He endorses eating at least 75% of each meal and has been eating graham crackers and peanut butter between meals.   Received c/s for new HD diet education. Provided handout from the [employer] on general guidelines for renal nutrition. Discussed limiting his salt intake which pt states he typically does not add salt to his foods at home. Pt states that his  fluids are a problem as he loves to drink a lot of [employer] and [employer] but is aware these contain phosphorus and potassium and need to be limited. Briefly spoke about foods containing potassium and phosphorus and limiting foods with added phosphorus. Also informed pt he will be followed by a Dietitian at the outpatient dialysis center who will closely monitor his labs and make recommendations.   Meal completions:  06/06: 75%-breakfast, 50%-dinner 06/08: 50%-breakfast, 100%-lunch  Current wt 123.8 kg. Post HD wt 06/07- 122.8 kg.   Medications: dulcolax, colace, SSI 0-24 units TID, levemir 22 units BID, lactulose, reglan, rena-vit, protonix  Labs: sodium 130, potassium 4.0 (WNL), BUN 63, Cr 7.44, phos 6.9 (H), CBG's 247-324 x24 hours  I/O's: -16,236 ml since admission  Edema: non-pitting BUE and BLE    Diet Order:   Diet Order             Diet heart healthy/carb modified Room service appropriate? Yes; Fluid consistency: Thin  Diet effective now                   EDUCATION NEEDS:   Education needs have been addressed  Skin:  Skin Assessment: Skin Integrity Issues: Skin Integrity Issues:: Other (Comment) Other: puncture to abdomen d/t rp fluid collection  Last BM:  6/8 (type 5 x2)  Height:   Ht Readings from Last 1 Encounters:  11/29/21 5\' 10"  (1.778 m)    Weight:   Wt Readings from Last 1 Encounters:  11/29/21 123.8 kg    Ideal Body Weight:  75.5 kg  BMI:  Body mass index is 39.16  kg/m.  Estimated Nutritional Needs:   Kcal:  2100-2300 kcals  Protein:  115-135 g  Fluid:  1.8 L  Clayborne Dana, RDN, LDN Clinical Nutrition

## 2021-11-29 NOTE — Progress Notes (Signed)
OT Cancellation Note  Patient Details Name: [name] [name] MRN: [id number] DOB: [birth year]   Cancelled Treatment:    Reason Eval/Treat Not Completed: Patient declined, no reason specified  Metta Clines 11/29/2021, 3:10 PM

## 2021-11-29 NOTE — Progress Notes (Signed)
[facility] for Infectious Disease  Date of Admission:  11/12/2021     Total days of antibiotics          ASSESSMENT:  [name] [name]'s WBC count remains stable. Cardioverted for atrial fibrillation.  Hematoma aspiration cultures remain without growth to date. Currently on Day 3 of Unasyn for presumed aspiration. Will continue Unaysn for total of 5 days and then recommend monitoring off antibiotics with no further evidence of infection. Remaining medical and supportive care per primary team.   PLAN:  Continue Unasyn through 11/30/21 at 2200 then monitor off antibiotics.  Atrial fibrillation management per Cardiology Post-operative care per CVTS.   ID will sign off. Please re-consult if needed.   Principal Problem:   S/P CABG x 3 Active Problems:   Leukocytosis    aspirin EC  325 mg Oral Daily   Or   aspirin  324 mg Per Tube Daily   atorvastatin  80 mg Oral Daily   bisacodyl  10 mg Rectal Q0600   Chlorhexidine Gluconate Cloth  6 each Topical Daily   darbepoetin (ARANESP) injection - DIALYSIS  100 mcg Intravenous Q Mon-HD   docusate sodium  200 mg Oral Daily   guaiFENesin  1,200 mg Oral BID   insulin aspart  0-24 Units Subcutaneous TID WC   insulin detemir  22 Units Subcutaneous BID   lactulose  10 g Oral Daily   lidocaine  1 patch Transdermal Q24H   metoCLOPramide (REGLAN) injection  5 mg Intravenous Q8H   midodrine  10 mg Oral TID WC   multivitamin  1 tablet Oral QHS   pantoprazole  40 mg Oral Daily   sodium chloride flush  3 mL Intravenous Q12H    SUBJECTIVE:  Afebrile overnight with no acute events. WBC count is stable. Cardioverted today.  No Known Allergies   Review of Systems: Review of Systems  Constitutional:  Negative for chills, fever and weight loss.  Respiratory:  Negative for cough, shortness of breath and wheezing.   Cardiovascular:  Negative for chest pain and leg swelling.  Gastrointestinal:  Negative for abdominal pain, constipation, diarrhea,  nausea and vomiting.  Skin:  Negative for rash.      OBJECTIVE: Vitals:   11/29/21 1100 11/29/21 1153 11/29/21 1213 11/29/21 1300  BP: (!) 121/46 (!) 136/47  (!) 135/45  Pulse: 78 79  83  Resp: 19 (!) 21  19  Temp:   97.8 F (36.6 C)   TempSrc:      SpO2: 94% 96%  94%  Weight:      Height:       Body mass index is 39.16 kg/m.  Physical Exam Constitutional:      General: He is not in acute distress.    Appearance: He is well-developed.  Cardiovascular:     Rate and Rhythm: Normal rate and regular rhythm.     Heart sounds: Normal heart sounds.  Pulmonary:     Effort: Pulmonary effort is normal.     Breath sounds: Normal breath sounds.  Skin:    General: Skin is warm and dry.  Neurological:     Mental Status: He is alert and oriented to person, place, and time.  Psychiatric:        Behavior: Behavior normal.        Thought Content: Thought content normal.        Judgment: Judgment normal.     Lab Results Lab Results  Component Value Date   WBC 17.8 (  H) 11/29/2021   HGB 8.4 (L) 11/29/2021   HCT 25.9 (L) 11/29/2021   MCV 92.5 11/29/2021   PLT 352 11/29/2021    Lab Results  Component Value Date   CREATININE 7.44 (H) 11/29/2021   BUN 63 (H) 11/29/2021   NA 130 (L) 11/29/2021   K 4.0 11/29/2021   CL 93 (L) 11/29/2021   CO2 22 11/29/2021    Lab Results  Component Value Date   ALT 9 11/18/2021   AST 26 11/18/2021   ALKPHOS 51 11/18/2021   BILITOT 0.8 11/18/2021     Microbiology: Recent Results (from the past 240 hour(s))  Expectorated Sputum Assessment w Gram Stain, Rflx to Resp Cult     Status: None   Collection Time: 11/20/21 11:10 AM   Specimen: Expectorated Sputum  Result Value Ref Range Status   Specimen Description EXPECTORATED SPUTUM  Final   Special Requests NONE  Final   Sputum evaluation   Final    THIS SPECIMEN IS ACCEPTABLE FOR SPUTUM CULTURE Performed at [facility], [place] [address], [place], [place] [address]    Report Status  11/20/2021 FINAL  Final  Culture, Respiratory w Gram Stain     Status: None   Collection Time: 11/20/21 11:10 AM  Result Value Ref Range Status   Specimen Description EXPECTORATED SPUTUM  Final   Special Requests NONE Reflexed from [id number]  Final   Gram Stain   Final    ABUNDANT WBC PRESENT, PREDOMINANTLY PMN FEW YEAST WITH PSEUDOHYPHAE BUDDING YEAST SEEN RARE GRAM POSITIVE RODS Performed at [facility], [place] [address], [place], [place] [address]    Culture FEW CANDIDA ALBICANS  Final   Report Status 11/23/2021 FINAL  Final  Body fluid culture w Gram Stain     Status: None   Collection Time: 11/22/21  1:25 PM   Specimen: Pleural Fluid  Result Value Ref Range Status   Specimen Description PLEURAL  Final   Special Requests NONE  Final   Gram Stain   Final    WBC PRESENT, PREDOMINANTLY PMN NO ORGANISMS SEEN CYTOSPIN SMEAR    Culture   Final    NO GROWTH 3 DAYS Performed at [facility], [place] [address] [place], [place] [address]    Report Status 11/25/2021 FINAL  Final  Urine Culture     Status: None   Collection Time: 11/23/21  5:45 AM   Specimen: Urine, Clean Catch  Result Value Ref Range Status   Specimen Description URINE, CLEAN CATCH  Final   Special Requests NONE  Final   Culture   Final    NO GROWTH Performed at [facility], [place] [address], [place], [place] [address]    Report Status 11/24/2021 FINAL  Final  Aerobic/Anaerobic Culture w Gram Stain (surgical/deep wound)     Status: None (Preliminary result)   Collection Time: 11/27/21 11:48 AM   Specimen: Abscess  Result Value Ref Range Status   Specimen Description ABSCESS  Final   Special Requests   Final    POST CT GUIDED LEFT RETROPERITONEAL HEMATOMA ASPIRATION   Gram Stain NO WBC SEEN NO ORGANISMS SEEN   Final   Culture   Final    NO GROWTH 2 DAYS NO ANAEROBES ISOLATED; CULTURE IN PROGRESS FOR 5 DAYS Performed at [facility], [place] [address], [place],  [address]    Report  Status PENDING  Incomplete     Terri Piedra, [facility]  11/29/2021  2:38 PM

## 2021-11-29 NOTE — Anesthesia Procedure Notes (Signed)
Procedure Name: General with mask airway Date/Time: 11/29/2021 7:45 AM  Performed by: Erick Colace, CRNAPre-anesthesia Checklist: Patient identified, Emergency Drugs available, Suction available and Patient being monitored Patient Re-evaluated:Patient Re-evaluated prior to induction Oxygen Delivery Method: Ambu bag Preoxygenation: Pre-oxygenation with 100% oxygen Induction Type: IV induction Dental Injury: Teeth and Oropharynx as per pre-operative assessment

## 2021-11-29 NOTE — Progress Notes (Signed)
Inpatient Diabetes Program Recommendations  AACE/ADA: New Consensus Statement on Inpatient Glycemic Control (2015)  Target Ranges:  Prepandial:   less than 140 mg/dL      Peak postprandial:   less than 180 mg/dL (1-2 hours)      Critically ill patients:  140 - 180 mg/dL   Lab Results  Component Value Date   GLUCAP 324 (H) 11/29/2021   HGBA1C 9.3 (H) 11/08/2021    Review of Glycemic Control  Latest Reference Range & Units 11/28/21 05:31 11/28/21 11:04 11/28/21 15:58 11/28/21 20:44 11/28/21 23:45 11/29/21 04:30 11/29/21 11:27  Glucose-Capillary 70 - 99 mg/dL 220 (H) 247 (H) 276 (H) 320 (H) 312 (H) 205 (H) 324 (H)  (H): Data is abnormally high  Diabetes history: DM2  Outpatient Diabetes medications: Trulicity 3 mg weekly, Farxiga 10 mg QD, Glipizide 10 mg BID, Metformin 1000 mg BID  Current orders for Inpatient glycemic control: Levimir 22 units BID, Novolog 0-24 units TID  Inpatient Diabetes Program Recommendations:    Semglee 24units BID Novolog 3 units TID with meals if consumes at least 50% Add carb modified to diet  Will continue to follow while inpatient.  Thank you, Reche Dixon, MSN, [place] Diabetes Coordinator Inpatient Diabetes Program [phone] (team pager from 8a-5p)

## 2021-11-29 NOTE — Procedures (Signed)
Electrical Cardioversion Procedure Note [name] [name] [id number] [birth year]  Procedure: Electrical Cardioversion Indications:  Atrial Fibrillation  Procedure Details Consent: Risks of procedure as well as the alternatives and risks of each were explained to the (patient/caregiver).  Consent for procedure obtained. Time Out: Verified patient identification, verified procedure, site/side was marked, verified correct patient position, special equipment/implants available, medications/allergies/relevent history reviewed, required imaging and test results available.  Performed  Patient placed on cardiac monitor, pulse oximetry, supplemental oxygen as necessary.  Sedation given:  Propofol per anesthesiology Pacer pads placed anterior and posterior chest.  Cardioverted 1 time(s).  Cardioverted at [place].  Evaluation Findings: Post procedure EKG shows: NSR Complications: None Patient did tolerate procedure well.   Loralie Champagne 11/29/2021, 7:50 AM

## 2021-11-29 NOTE — TOC CM/SW Note (Signed)
HF TOC CM spoke to pt and gave permission to speak to wife, Thayer Headings. Contacted ex-wife and discussed HH. Offered choice and agreeable to agency that will accept his plan. (Medicare.gov list with ratings on chart). Contacted [facility] rep, with new referral. Pt will need [facility] with Seat and 3n1 bsc. Pt may also need oxygen. Will have Unit RN check walking oxygen sat closer to dc dated for possible oxygen at home. Will need HH RN and PT orders with F2F. Pt has declined SNF rehab. But maybe agreeable to IP rehab if PT recommends. [place], Heart Failure TOC CM [phone]

## 2021-11-29 NOTE — Progress Notes (Signed)
      [address]       [place] [address]             [phone]    POD # 17  BP (!) 116/31   Pulse 72   Temp 98.6 F (37 C) (Oral)   Resp 19   Ht 5\' 10"  (1.778 m)   Wt 125.5 kg   SpO2 96%   BMI 39.70 kg/m   Intake/Output Summary (Last 24 hours) at 11/29/2021 1739 Last data filed at 11/29/2021 1600 Gross per 24 hour  Intake 911.74 ml  Output --  Net 911.74 ml     No new issues today  Remo Lipps C. Roxan Hockey, MD [facility] [phone]

## 2021-11-29 NOTE — Progress Notes (Signed)
[facility] for IV heparin> apixaban Indication: atrial fibrillation  No Known Allergies  Patient Measurements: Height: 5\' 10"  (177.8 cm) Weight: 123.8 kg (272 lb 14.9 oz) IBW/kg (Calculated) : 73 Heparin Dosing Weight: ~ 100 kg  Vital Signs: Temp: 97.8 F (36.6 C) (06/09 1213) Temp Source: Temporal (06/09 0753) BP: 135/45 (06/09 1300) Pulse Rate: 83 (06/09 1300)  Labs: Recent Labs    11/27/21 0145 11/28/21 0454 11/28/21 1407 11/29/21 0550  HGB 8.7* 8.1*  --  8.4*  HCT 27.7* 25.4*  --  25.9*  PLT 309 316  --  352  LABPROT 15.9*  --  16.4*  --   INR 1.3*  --  1.3*  --   HEPARINUNFRC 0.15* <0.10* 0.22* 0.20*  CREATININE 6.66* 5.35*  --  7.44*     Estimated Creatinine Clearance: 12.7 mL/min (A) (by C-G formula based on SCr of 7.44 mg/dL (H)).   Medical History: Past Medical History:  Diagnosis Date   Atrial fibrillation ([place])    Benign essential HTN 07/30/2014   Bradycardia 01/25/2015   CHF (congestive heart failure) (HCC)    Chronic anticoagulation 35/36/1443   Chronic systolic CHF (congestive heart failure), NYHA class 3 ([place]) 08/30/2014   Overview:  Global ef 30%   CKD (chronic kidney disease)    Class 3 severe obesity in adult ([place]) 04/28/2017   Coronary artery disease    Diabetes mellitus without complication (HCC)    Diabetic neuropathy ([facility]) 07/28/2014   Dysrhythmia    Erectile dysfunction    High risk medication use 05/26/2018   Hypertension    Hypertensive heart disease with heart failure ([place]) 07/30/2014   Lumbago    LV dysfunction 04/28/2017   Mixed hyperlipidemia 07/28/2014   OSA (obstructive sleep apnea)    CPAP   Paroxysmal atrial fibrillation ([place]) 07/28/2014   Sinus node dysfunction ([place]) 04/21/2016   Testicular hypofunction    Uncontrolled type 2 diabetes mellitus with microalbuminuric diabetic nephropathy 07/28/2014   Assessment: 66 yo male on chronic Xarelto for afib.  Now s/p CABG with MAZE,  post op AKI CRRT>iHD.   Pharmacy asked to dose IV heparin. Targeting lowering heparin level goal given recent OHS. S/P DCCV x2 now back in Afib> restart IV amiodarone and repeat DCCV 6/9   Per Dr. Darcey Nora- change to apixaban on 6/9 in the morning -hg= 8.5  Goal of Therapy:  Heparin level 0.3-0.5 Monitor platelets by anticoagulation protocol: Yes   Plan:  --Continue heparin until 6/10 -Start apixaban 5mg  po bid on 6/10 at 10am and stop heparin  Hildred Laser, PharmD Clinical Pharmacist **Pharmacist phone directory can now be found on [email or web address] ([email or web address]).  Listed under [place].

## 2021-11-29 NOTE — Plan of Care (Signed)

## 2021-11-29 NOTE — Interval H&P Note (Signed)
History and Physical Interval Note:  11/29/2021 7:40 AM  [name] [name]  has presented today for surgery, with the diagnosis of atrial fibrillation.  The various methods of treatment have been discussed with the patient and family. After consideration of risks, benefits and other options for treatment, the patient has consented to  Procedure(s): CARDIOVERSION (N/A) as a surgical intervention.  The patient's history has been reviewed, patient examined, no change in status, stable for surgery.  I have reviewed the patient's chart and labs.  Questions were answered to the patient's satisfaction.     [name] [name] [employer]

## 2021-11-29 NOTE — Progress Notes (Addendum)
[facility] DAILY ICU PROGRESS NOTE                   [address]            [place] [address]          [phone]   Day of Surgery Procedure(s) (LRB): CARDIOVERSION (N/A)  Total Length of Stay:  LOS: 17 days   Subjective: Patient just had another DCCV;he is in sinus rhythm with HR in the 60's.  Objective: Vital signs in last 24 hours: Temp:  [96.6 F (35.9 C)-98.8 F (37.1 C)] 97.8 F (36.6 C) (06/09 0753) Pulse Rate:  [58-114] 60 (06/09 0805) Cardiac Rhythm: Atrial fibrillation (06/08 2000) Resp:  [11-29] 13 (06/09 0805) BP: (100-158)/(34-95) 147/51 (06/09 0805) SpO2:  [89 %-98 %] 98 % (06/09 0805) Weight:  [123.8 kg] 123.8 kg (06/09 0725)  Filed Weights   11/28/21 0400 11/29/21 0500 11/29/21 0725  Weight: 120.9 kg 123.8 kg 123.8 kg    Weight change: 1.032 kg   Hemodynamic parameters for last 24 hours: CVP:  [8 mmHg-16 mmHg] 8 mmHg  Intake/Output from previous day: 06/08 0701 - 06/09 0700 In: 1385.5 [P.O.:360; I.V.:825.5; IV Piggyback:200] Out: -   Intake/Output this shift: No intake/output data recorded.  Current Meds: Scheduled Meds:  [MAR Hold] aspirin EC  325 mg Oral Daily   Or   [MAR Hold] aspirin  324 mg Per Tube Daily   [MAR Hold] atorvastatin  80 mg Oral Daily   [MAR Hold] bisacodyl  10 mg Rectal Q0600   [MAR Hold] Chlorhexidine Gluconate Cloth  6 each Topical Daily   [MAR Hold] darbepoetin (ARANESP) injection - DIALYSIS  100 mcg Intravenous Q Mon-HD   [MAR Hold] docusate sodium  200 mg Oral Daily   [MAR Hold] guaiFENesin  1,200 mg Oral BID   [MAR Hold] insulin aspart  0-24 Units Subcutaneous TID WC   insulin detemir  22 Units Subcutaneous BID   [MAR Hold] lactulose  10 g Oral Daily   [MAR Hold] lidocaine  1 patch Transdermal Q24H   [MAR Hold] metoCLOPramide (REGLAN) injection  5 mg Intravenous Q8H   midodrine  10 mg Oral TID WC   [MAR Hold] multivitamin  1 tablet Oral QHS   [MAR Hold] pantoprazole  40 mg Oral Daily   [MAR Hold]  sodium chloride flush  3 mL Intravenous Q12H   Continuous Infusions:  sodium chloride Stopped (11/26/21 2303)   sodium chloride 20 mL/hr at 11/29/21 0731   amiodarone 30 mg/hr (11/29/21 0742)   [MAR Hold] ampicillin-sulbactam (UNASYN) IV Stopped (11/28/21 2150)   [MAR Hold] anticoagulant sodium citrate     ceFAZolin Stopped (11/28/21 1707)   heparin 1,900 Units/hr (11/29/21 0742)   PRN Meds:.[MAR Hold] acetaminophen, [MAR Hold] alteplase, [MAR Hold] anticoagulant sodium citrate, ceFAZolin, [MAR Hold] heparin, [MAR Hold] ipratropium-albuterol, [MAR Hold] lidocaine (PF), [MAR Hold] lidocaine-prilocaine, [MAR Hold] magic mouthwash w/lidocaine, [MAR Hold] ondansetron (ZOFRAN) IV, [MAR Hold] oxyCODONE, [MAR Hold] pentafluoroprop-tetrafluoroeth, [MAR Hold] sodium chloride flush, [MAR Hold] traMADol  General appearance: alert, cooperative, and no distress Heart: RRR Lungs: Clear to auscultation bilaterally Abdomen: Soft, obese, non tender, bowel sounds present Extremities: Trace LE edema Wounds: Clean and dry  Lab Results: CBC: Recent Labs    11/28/21 0454 11/29/21 0550  WBC 17.5* 17.8*  HGB 8.1* 8.4*  HCT 25.4* 25.9*  PLT 316 352    BMET:  Recent Labs    11/28/21 0454 11/29/21 0550  NA 130* 130*  K  3.9 4.0  CL 93* 93*  CO2 25 22  GLUCOSE 235* 187*  BUN 39* 63*  CREATININE 5.35* 7.44*  CALCIUM 8.0* 8.1*     CMET: Lab Results  Component Value Date   WBC 17.8 (H) 11/29/2021   HGB 8.4 (L) 11/29/2021   HCT 25.9 (L) 11/29/2021   PLT 352 11/29/2021   GLUCOSE 187 (H) 11/29/2021   ALT 9 11/18/2021   AST 26 11/18/2021   NA 130 (L) 11/29/2021   K 4.0 11/29/2021   CL 93 (L) 11/29/2021   CREATININE 7.44 (H) 11/29/2021   BUN 63 (H) 11/29/2021   CO2 22 11/29/2021   INR 1.3 (H) 11/28/2021   HGBA1C 9.3 (H) 11/08/2021      PT/INR:  Recent Labs    11/28/21 1407  LABPROT 16.4*  INR 1.3*    Radiology: No results found.   Assessment/Plan: S/P Procedure(s)  (LRB): CARDIOVERSION (N/A) CV-PAF. Has had DCCV x 3. On Amiodarone and Heparin drips and Midodrine 15 mg tid. 2. Pulmonary-History of OSA (on CPAP). On 2 liters via [place]. Wean as able. Encourage incentive spirometer. 3. Acute on chronic CHF-Heart failure had been following 4. Expected post op blood loss anemia-H and H this am stable at 8.4 and 25.9. On Aranesp 5. DM-CBGs 320/312/205. Pre op HGA1C 9.3. On Insulin. May need drip if not able to control soon. Will not restart Metformin or Glipizide yet as creatinine elevated. Will need close medical follow up after discharge 6. AKI on CKD (stage III)-Creatinine decreased to 7.44 this am. Nephrology following and arranging for HD 7. Physical deconditioning-Continue PT. Home PT ordered 8. Leukocytosis-On Unasyn. WBC this am 17,800. Blood culture show no growth to date. S/p CT guided aspiration by IR for retroperitoneal hematoma (hematoma). 55 cc removed   Donielle Liston Alba PA-C 11/29/2021 8:22 AM   Tunneled HD catheter in place, HD on M-W-F WBC improved Walking with PT NSR on po amio Plan transition to  Eliquis now tunneled HD cath in place Will transfer to [facility]   on Monday  patient examined and medical record reviewed,agree with above note. Dahlia Byes 11/29/2021

## 2021-11-29 NOTE — Progress Notes (Signed)
PT Cancellation Note  Patient Details Name: [name] [name] MRN: [id number] DOB: [birth year]   Cancelled Treatment:    Reason Eval/Treat Not Completed: Patient at procedure or test/unavailable. Will follow-up for PT treatment as schedule permits.  Mabeline Caras, PT, DPT Acute Rehabilitation Services  Pager [phone] Office [place] 11/29/2021, 7:34 AM

## 2021-11-29 NOTE — Anesthesia Postprocedure Evaluation (Signed)
Anesthesia Post Note  Patient: [name] [name]  Procedure(s) Performed: CARDIOVERSION     Patient location during evaluation: PACU Anesthesia Type: General Level of consciousness: sedated Pain management: pain level controlled Vital Signs Assessment: post-procedure vital signs reviewed and stable Respiratory status: spontaneous breathing and respiratory function stable Cardiovascular status: stable Postop Assessment: no apparent nausea or vomiting Anesthetic complications: no   No notable events documented.  Last Vitals:  Vitals:   11/29/21 0825 11/29/21 0900  BP: (!) 168/54   Pulse: 67 78  Resp: 13 18  Temp:    SpO2: 97% 93%    Last Pain:  Vitals:   11/29/21 0810  TempSrc:   PainSc: 0-No pain                 Zenab Gronewold DANIEL

## 2021-11-29 NOTE — Transfer of Care (Signed)
Immediate Anesthesia Transfer of Care Note  Patient: [name] [name]  Procedure(s) Performed: CARDIOVERSION  Patient Location: Short Stay  Anesthesia Type:General  Level of Consciousness: drowsy  Airway & Oxygen Therapy: Patient Spontanous Breathing and Patient connected to nasal cannula oxygen  Post-op Assessment: Report given to RN and Post -op Vital signs reviewed and stable  Post vital signs: Reviewed and stable  Last Vitals:  Vitals Value Taken Time  BP 133/45   Temp    Pulse 60   Resp 12   SpO2 96     Last Pain:  Vitals:   11/29/21 0725  TempSrc: Temporal  PainSc: 0-No pain      Patients Stated Pain Goal: 0 (04/18/24 3664)  Complications: No notable events documented.

## 2021-11-29 NOTE — TOC Benefit Eligibility Note (Signed)
Patient Teacher, English as a foreign language completed.    The patient is currently admitted and upon discharge could be taking Eliquis 5 mg.  The current 30 day co-pay is, $0.00.   The patient is insured through [employer] Patient Advocate Specialist [place] Patient Advocate Team Direct Number: [phone]  Fax: [phone]

## 2021-11-29 NOTE — Progress Notes (Signed)
[place] for IV heparin Indication: atrial fibrillation  No Known Allergies  Patient Measurements: Height: 5\' 10"  (177.8 cm) Weight: 123.8 kg (273 lb) IBW/kg (Calculated) : 73 Heparin Dosing Weight: ~ 100 kg  Vital Signs: Temp: 98.8 F (37.1 C) (06/09 0400) Temp Source: Axillary (06/09 0400) BP: 142/59 (06/09 0700) Pulse Rate: 91 (06/09 0700)  Labs: Recent Labs    11/27/21 0145 11/28/21 0454 11/28/21 1407 11/29/21 0550  HGB 8.7* 8.1*  --  8.4*  HCT 27.7* 25.4*  --  25.9*  PLT 309 316  --  352  LABPROT 15.9*  --  16.4*  --   INR 1.3*  --  1.3*  --   HEPARINUNFRC 0.15* <0.10* 0.22* 0.20*  CREATININE 6.66* 5.35*  --  7.44*     Estimated Creatinine Clearance: 12.7 mL/min (A) (by C-G formula based on SCr of 7.44 mg/dL (H)).   Medical History: Past Medical History:  Diagnosis Date   Atrial fibrillation ([place])    Benign essential HTN 07/30/2014   Bradycardia 01/25/2015   CHF (congestive heart failure) (HCC)    Chronic anticoagulation 86/76/7209   Chronic systolic CHF (congestive heart failure), NYHA class 3 ([place]) 08/30/2014   Overview:  Global ef 30%   CKD (chronic kidney disease)    Class 3 severe obesity in adult ([place]) 04/28/2017   Coronary artery disease    Diabetes mellitus without complication (HCC)    Diabetic neuropathy ([place]) 07/28/2014   Dysrhythmia    Erectile dysfunction    High risk medication use 05/26/2018   Hypertension    Hypertensive heart disease with heart failure ([place]) 07/30/2014   Lumbago    LV dysfunction 04/28/2017   Mixed hyperlipidemia 07/28/2014   OSA (obstructive sleep apnea)    CPAP   Paroxysmal atrial fibrillation ([place]) 07/28/2014   Sinus node dysfunction ([place]) 04/21/2016   Testicular hypofunction    Uncontrolled type 2 diabetes mellitus with microalbuminuric diabetic nephropathy 07/28/2014   Assessment: 66 yo male on chronic Xarelto for afib.  Now s/p CABG with MAZE, post op AKI  CRRT>iHD.   Pharmacy asked to dose IV heparin. Targeting lowering heparin level goal given recent OHS.  No bleeding noted, hgb 8.4 - stable  S/P DCCV x2 now back in Afib> restart IV amiodarone and repeat DCCV 6/9   Convert to oral anticoagulant when procedures complete.  Heparin level low this am, will adjust to goal.   Goal of Therapy:  Heparin level 0.3-0.5 Monitor platelets by anticoagulation protocol: Yes   Plan:  Increase IV heparin infusion to 2050 units/hr  Heparin level 8 hrs after infusion rate change Monitor daily CBC, heparin levels and s/sx of bleeding   Erin Hearing PharmD., BCPS Clinical Pharmacist 11/29/2021 7:26 AM

## 2021-11-29 NOTE — Progress Notes (Signed)
Placed pt on cpap for the night at previous settings, tol well at this time

## 2021-11-29 NOTE — Progress Notes (Signed)
Patient ID: [name] [name], male   DOB: 11-Nov-1955, 66 y.o.   MRN: [id number]     Advanced Heart Failure Rounding Note  PCP-Cardiologist: None   Subjective:    Abx started 5/25 for suspected septic shock.  Initial abx completed and afebrile. WBC trending down to 17. CCM added rocephin 06/01 for possible aspiration. On 6/2, abx transitioned to vanc/cefepime and micafungin added (yeast in resp culture). On 6/6, abx transitioned to Unasyn and micafungin/vancomycin stopped.   6/7 s/p aspiration of RP collection, this was likely hematoma and not abscess.   S/p TEE/DCCV to SR 06/01. 6/5 S/P DC-CV--->back in A fib. DCCV this morning, back in NSR. He remains on heparin gtt and amiodarone gtt.   Left thoracentesis 6/2 with 900 cc off.   Echo: Difficult study. LV EF 55-60%, RV probably normal, no pericardial effusion.   Getting MWF HD.   Hgb stable 8.4 today. .   Objective:   Weight Range: 123.8 kg Body mass index is 39.16 kg/m.   Vital Signs:   Temp:  [96.6 F (35.9 C)-98.8 F (37.1 C)] 97.8 F (36.6 C) (06/09 0753) Pulse Rate:  [60-114] 60 (06/09 0753) Resp:  [11-29] 13 (06/09 0753) BP: (100-158)/(34-95) 139/49 (06/09 0753) SpO2:  [91 %-98 %] 98 % (06/09 0753) Weight:  [123.8 kg] 123.8 kg (06/09 0725) Last BM Date : 11/26/21  Weight change: Filed Weights   11/28/21 0400 11/29/21 0500 11/29/21 0725  Weight: 120.9 kg 123.8 kg 123.8 kg    Intake/Output:   Intake/Output Summary (Last 24 hours) at 11/29/2021 0756 Last data filed at 11/29/2021 0700 Gross per 24 hour  Intake 1385.49 ml  Output --  Net 1385.49 ml     Physical Exam  General: NAD Neck: Thick, JVP 10, no thyromegaly or thyroid nodule.  Lungs: Clear to auscultation bilaterally with normal respiratory effort. CV: Nondisplaced PMI.  Heart regular S1/S2, no S3/S4, no murmur.  Trace ankle edema.   Abdomen: Soft, nontender, no hepatosplenomegaly, no distention.  Skin: Intact without lesions or rashes.  Neurologic:  Alert and oriented x 3.  Psych: Normal affect. Extremities: No clubbing or cyanosis.  HEENT: Normal.   Telemetry   Atrial fibrillation 70s => NSR (personally reviewed)  Labs    CBC Recent Labs    11/28/21 0454 11/29/21 0550  WBC 17.5* 17.8*  HGB 8.1* 8.4*  HCT 25.4* 25.9*  MCV 92.7 92.5  PLT 316 016   Basic Metabolic Panel Recent Labs    11/28/21 0454 11/29/21 0550  NA 130* 130*  K 3.9 4.0  CL 93* 93*  CO2 25 22  GLUCOSE 235* 187*  BUN 39* 63*  CREATININE 5.35* 7.44*  CALCIUM 8.0* 8.1*  MG 1.8 2.1  PHOS 5.8* 6.9*   Liver Function Tests Recent Labs    11/28/21 0454 11/29/21 0550  ALBUMIN 2.2* 2.3*   No results for input(s): "LIPASE", "AMYLASE" in the last 72 hours. Cardiac Enzymes No results for input(s): "CKTOTAL", "CKMB", "CKMBINDEX", "TROPONINI" in the last 72 hours.  BNP: BNP (last 3 results) No results for input(s): "BNP" in the last 8760 hours.  ProBNP (last 3 results) No results for input(s): "PROBNP" in the last 8760 hours.   D-Dimer No results for input(s): "DDIMER" in the last 72 hours. Hemoglobin A1C No results for input(s): "HGBA1C" in the last 72 hours. Fasting Lipid Panel No results for input(s): "CHOL", "HDL", "LDLCALC", "TRIG", "CHOLHDL", "LDLDIRECT" in the last 72 hours. Thyroid Function Tests No results for input(s): "TSH", "T4TOTAL", "T3FREE", "THYROIDAB"  in the last 72 hours.  Invalid input(s): "FREET3"  Other results:   Imaging    No results found.   Medications:     Scheduled Medications:  [MAR Hold] aspirin EC  325 mg Oral Daily   Or   [MAR Hold] aspirin  324 mg Per Tube Daily   [MAR Hold] atorvastatin  80 mg Oral Daily   [MAR Hold] bisacodyl  10 mg Rectal Q0600   [MAR Hold] Chlorhexidine Gluconate Cloth  6 each Topical Daily   [MAR Hold] darbepoetin (ARANESP) injection - DIALYSIS  100 mcg Intravenous Q Mon-HD   [MAR Hold] docusate sodium  200 mg Oral Daily   [MAR Hold] guaiFENesin  1,200 mg Oral BID    [MAR Hold] insulin aspart  0-24 Units Subcutaneous TID WC   [MAR Hold] lactulose  10 g Oral Daily   [MAR Hold] lidocaine  1 patch Transdermal Q24H   [MAR Hold] metoCLOPramide (REGLAN) injection  5 mg Intravenous Q8H   [MAR Hold] midodrine  15 mg Oral TID WC   [MAR Hold] multivitamin  1 tablet Oral QHS   [MAR Hold] pantoprazole  40 mg Oral Daily   [MAR Hold] sodium chloride flush  3 mL Intravenous Q12H    Infusions:  sodium chloride Stopped (11/26/21 2303)   sodium chloride 20 mL/hr at 11/29/21 0731   amiodarone 30 mg/hr (11/29/21 0742)   [MAR Hold] ampicillin-sulbactam (UNASYN) IV Stopped (11/28/21 2150)   [MAR Hold] anticoagulant sodium citrate     ceFAZolin Stopped (11/28/21 1707)   heparin 1,900 Units/hr (11/29/21 0742)    PRN Medications: [MAR Hold] acetaminophen, [MAR Hold] alteplase, [MAR Hold] anticoagulant sodium citrate, ceFAZolin, [MAR Hold] heparin, [MAR Hold] ipratropium-albuterol, [MAR Hold] lidocaine (PF), [MAR Hold] lidocaine-prilocaine, [MAR Hold] magic mouthwash w/lidocaine, [MAR Hold] ondansetron (ZOFRAN) IV, [MAR Hold] oxyCODONE, [MAR Hold] pentafluoroprop-tetrafluoroeth, [MAR Hold] sodium chloride flush, [MAR Hold] traMADol    Patient Profile   66 y.o. male with history of CM with recovered EF, persistent atrial fibrillation, DM II, CKD, sick sinus syndrome, OSA, prior smoker. Underwent CABG X 3 and MAZE/PVI on 05/23>>post-op decrease in UOP and AKI with volume overload>>postcardiotomy shock requiring inotrope and pressor support  Assessment/Plan   1. Shock: Echo reviewed, LV EF 55-60%, RV poorly visualized but probably near-normal, no significant pericardial effusion. Suspect sepsis physiology with distributive shock.  Improved with broad spectrum abx. Lactate has cleared. Bcx 5/25 remain NGTD. Now off pressors/resolved.   - Can decrease midodrine to 10 mg tid.  2. Acute on chronic primarily diastolic CHF: LV EF 06-26%, RV poorly visualized but probably  near-normal, no significant pericardial effusion.  Patient is still volume overloaded, suspect primarily right heart failure physiology.  CVVHD started on 5/26. Now getting iHD and tolerating.  - HD today.  3. AKI on CKD stage 3: Baseline SCr 1.3-1.5 .  Suspect due to shock/ATN as well as elevated renal venous pressure in setting of volume overload, ileus, abdominal distention. CVVHD started 5/26. Remains nearly anuric.  Lost HD line 5/31, replaced 06/01. Now getting iHD. Minimal urine output.  - MWF HD.   4. CAD: S/p CABG. No chest pain.  - Continue ASA, statin.  5. Atrial fibrillation with RVR: Has had Maze and LA appendage clip at time of CABG on 11/12/21. S/p TEE/DCCV to SR on 06//01. Back in AF by 6/2.  DCCV 6/5 and again on 6/9 to NSR.   - Continue heparin gtt as long as hgb stays relatively stable, had DCCV so higher risk for  CVA.  Eventual transition to Eliquis when he has dialysis access.  - Continue amiodarone gtt today.  6. DM2: SSI.  7. ID: Persistent leukocytosis.  Septic shock initially, resolved.  Blood cultures NGTD.  Treated for HCAP, completed vanc and cefepime.  With WBCs rising again and concern for aspiration, he was started back on vanc/cefepime and micafungin given yeast in sputum. CT chest/abd/pelvis with left retroperitoneal fluid collection (?hemorrhage vs abscess), bilateral lower lobe infiltrates vs atelectasis, moderate loculated left pleural effusion.  He was transitioned 6/6 to Unasyn to cover aspiration PNA. IR aspiration of RP fluid collection => likely hematoma and not abscess. Afebrile, WBCs 17.  - Continue Unasyn x 5 days.  8. Ileus: Improved.  9. Pulmonary: Respiratory status improving with fluid removal and abx. Thoracentesis on 6/2.  - HD for volume removal  - CCM following.   10. RP fluid collection: 6/7 S/P aspiration by IR. Likely hematoma.   Length of Stay: [place], MD  11/29/2021, 7:56 AM  Advanced Heart Failure Team Pager [phone] (M-F; 7a -  5p)  Please contact [facility] for night-coverage after hours (5p -7a ) and weekends on [email or web address]

## 2021-11-29 NOTE — Progress Notes (Signed)
Patient ID: [name] [name], male   DOB: 11/26/1955, 66 y.o.   MRN: [id number] S: No new complaints.  Had RIJ [place] placed by IR yesterday. O:BP (!) 122/46   Pulse 75   Temp 97.8 F (36.6 C) (Temporal)   Resp 17   Ht 5\' 10"  (1.778 m)   Wt 123.8 kg   SpO2 95%   BMI 39.16 kg/m   Intake/Output Summary (Last 24 hours) at 11/29/2021 1126 Last data filed at 11/29/2021 1000 Gross per 24 hour  Intake 1363.18 ml  Output --  Net 1363.18 ml   Intake/Output: I/O last 3 completed shifts: In: 2010.2 [P.O.:600; I.V.:1111.2; IV Piggyback:299] Out: 2500 [Other:2500]  Intake/Output this shift:  Total I/O In: 133.3 [I.V.:133.3] Out: -  Weight change: 1.032 kg Gen: NAD CVS: RRR Resp:CTA Abd: +BS, soft, NT/ND Ext: trace pretibial edema  Recent Labs  Lab 11/24/21 1543 11/25/21 0358 11/25/21 2210 11/26/21 0436 11/27/21 0145 11/28/21 0454 11/29/21 0550  NA 133* 130* 127* 130* 131* 130* 130*  K 4.3 4.6 3.6 4.1 4.3 3.9 4.0  CL 94* 94* 88* 91* 93* 93* 93*  CO2 24 21* 22 23 20* 25 22  GLUCOSE 126* 187* 201* 241* 243* 235* 187*  BUN 46* 52* 31* 35* 52* 39* 63*  CREATININE 4.99* 6.09* 4.01* 4.92* 6.66* 5.35* 7.44*  ALBUMIN 3.1* 2.9* 2.8* 2.5* 2.6* 2.2* 2.3*  CALCIUM 8.7* 8.5* 7.9* 8.2* 8.4* 8.0* 8.1*  PHOS 5.2* 6.0* 3.9 5.2* 7.8* 5.8* 6.9*   Liver Function Tests: Recent Labs  Lab 11/22/21 2239 11/23/21 0434 11/27/21 0145 11/28/21 0454 11/29/21 0550  PROT 6.6  --   --   --   --   ALBUMIN  --    < > 2.6* 2.2* 2.3*   < > = values in this interval not displayed.   No results for input(s): "LIPASE", "AMYLASE" in the last 168 hours. No results for input(s): "AMMONIA" in the last 168 hours. CBC: Recent Labs  Lab 11/26/21 0436 11/26/21 1513 11/27/21 0145 11/28/21 0454 11/29/21 0550  WBC 25.2*  25.4* 25.2* 24.5* 17.5* 17.8*  NEUTROABS 20.1*  --   --   --   --   HGB 7.6*  7.6* 8.5* 8.7* 8.1* 8.4*  HCT 24.0*  23.8* 26.2* 27.7* 25.4* 25.9*  MCV 94.9  93.0 91.3 94.2 92.7 92.5  PLT  293  287 293 309 316 352   Cardiac Enzymes: No results for input(s): "CKTOTAL", "CKMB", "CKMBINDEX", "TROPONINI" in the last 168 hours. CBG: Recent Labs  Lab 11/28/21 1104 11/28/21 1558 11/28/21 2044 11/28/21 2345 11/29/21 0430  GLUCAP 247* 276* 320* 312* 205*    Iron Studies: No results for input(s): "IRON", "TIBC", "TRANSFERRIN", "FERRITIN" in the last 72 hours. Studies/Results: DG Chest Port 1 View  Result Date: 11/28/2021 CLINICAL DATA:  History of CABG. EXAM: PORTABLE CHEST 1 VIEW COMPARISON:  AP chest 11/26/2021 FINDINGS: Status post median sternotomy and CABG. Cardiac silhouette is again at least moderately enlarged. Mediastinal contours are within normal limits. Calcification is again seen within the aortic arch. Interval removal of right upper extremity PICC. Redemonstration of left internal jugular central venous catheter sheath with associated catheter tip overlying the superior vena cava, unchanged. Mild left interstitial thickening is similar to prior. Left basilar heterogeneous opacification again likely corresponding to the loculated left posterior pleural effusion and associated atelectasis better seen on prior CT. No pneumothorax. Mild multilevel degenerative disc changes of the thoracic spine. IMPRESSION: 1. Interval removal of right upper extremity PICC. 2. Stable  enlarged cardiac silhouette. 3. Mild left interstitial thickening and basilar heterogeneous opacification are not significantly changed, corresponding to the loculated left basilar pleural effusion better seen on prior CT. Electronically Signed   By: Yvonne Kendall M.D.   On: 11/28/2021 08:22   CT ASPIRATION  Result Date: 11/27/2021 INDICATION: Recent CABG, now with sepsis of uncertain etiology. Please perform image guided aspiration and/or drainage catheter placement of left-sided retroperitoneal hematoma given concern for superinfection. Additionally, please perform image guided thoracentesis as indicated. EXAM:  CT-GUIDED LEFT-SIDED RETROPERITONEAL HEMATOMA ASPIRATION COMPARISON:  CT abdomen and pelvis-11/25/2021 MEDICATIONS: The patient is currently admitted to the hospital and receiving intravenous antibiotics. The antibiotics were administered within an appropriate time frame prior to the initiation of the procedure. ANESTHESIA/SEDATION: Moderate (conscious) sedation was employed during this procedure. A total of Versed 1 mg and Fentanyl 50 mcg was administered intravenously. Moderate Sedation Time: 18 minutes. The patient's level of consciousness and vital signs were monitored continuously by radiology nursing throughout the procedure under my direct supervision. CONTRAST:  None COMPLICATIONS: None immediate. PROCEDURE: RADIATION DOSE REDUCTION: This exam was performed according to the departmental dose-optimization program which includes automated exposure control, adjustment of the mA and/or kV according to patient size and/or use of iterative reconstruction technique. Informed written consent was obtained from the patient after a discussion of the risks, benefits and alternatives to treatment. The patient was placed supine, slightly RPO on the CT gantry and a pre procedural CT was performed re-demonstrating the known left-sided retroperitoneal hematoma with dominant component measuring approximately 6.6 x 5.9 cm (image 60, series 2). Dedicated imaging of the lower thorax demonstrates a unchanged small/trace left-sided pleural effusion, too small to allow for safe ultrasound-guided thoracentesis. The procedure was planned. A timeout was performed prior to the initiation of the procedure. The skin overlying the lateral aspect of the left mid abdomen was prepped and draped in the usual sterile fashion. The overlying soft tissues were anesthetized with 1% lidocaine with epinephrine. Appropriate trajectory was planned with the use of a 22 gauge spinal needle. An 18 gauge trocar needle was advanced into the abscess/fluid  collection and a short Amplatz super stiff wire was coiled within the collection. Appropriate positioning was confirmed with a limited CT scan. The tract was serially dilated allowing placement of a 10 [place] all-purpose drainage catheter. Imaging was performed demonstrating the drainage catheter coiled within the posterolateral aspect of the left-sided retroperitoneal hematoma. From this location, approximally 30 cc of thick, bloody fluid was aspirated and a drainage catheter was removed. Given residual amount of hematoma, the more central and more hypoattenuating and thus potentially liquified component of the hematoma was targeted with an 18 gauge trocar needle. A short Amplatz wire was coiled within the more central aspect of the collection and a 10 French percutaneous drainage catheter was coiled and locked within the collection. An additional approximately 25 cc of similar appearing thick, bloody fluid was aspirated from the left-sided retroperitoneal hematoma. The external portion of the drainage catheter was cut and drainage catheter was removed intact. A dressing was applied. The patient tolerated the procedure well without immediate postprocedural complication. IMPRESSION: 1. Successful CT guided aspiration of a total of 55 cc of thick, bloody fluid from left-sided retroperitoneal hematoma. As the collection is favored to represent a sterile non infected hematoma, a drainage catheter was not left in place. All aspirated fluid was capped and sent to the laboratory for analysis. 2. Small/trace partially loculated left-sided pleural effusion, similar to the 11/25/2021 examination  and too small to allow for safe image guided thoracentesis. Electronically Signed   By: Sandi Mariscal M.D.   On: 11/27/2021 14:30    aspirin EC  325 mg Oral Daily   Or   aspirin  324 mg Per Tube Daily   atorvastatin  80 mg Oral Daily   bisacodyl  10 mg Rectal Q0600   Chlorhexidine Gluconate Cloth  6 each Topical Daily    darbepoetin (ARANESP) injection - DIALYSIS  100 mcg Intravenous Q Mon-HD   docusate sodium  200 mg Oral Daily   guaiFENesin  1,200 mg Oral BID   insulin aspart  0-24 Units Subcutaneous TID WC   insulin detemir  22 Units Subcutaneous BID   lactulose  10 g Oral Daily   lidocaine  1 patch Transdermal Q24H   metoCLOPramide (REGLAN) injection  5 mg Intravenous Q8H   midodrine  10 mg Oral TID WC   multivitamin  1 tablet Oral QHS   pantoprazole  40 mg Oral Daily   sodium chloride flush  3 mL Intravenous Q12H    BMET    Component Value Date/Time   NA 130 (L) 11/29/2021 0550   NA 139 10/21/2021 1547   K 4.0 11/29/2021 0550   CL 93 (L) 11/29/2021 0550   CO2 22 11/29/2021 0550   GLUCOSE 187 (H) 11/29/2021 0550   BUN 63 (H) 11/29/2021 0550   BUN 29 (H) 10/21/2021 1547   CREATININE 7.44 (H) 11/29/2021 0550   CALCIUM 8.1 (L) 11/29/2021 0550   GFRNONAA 7 (L) 11/29/2021 0550   GFRAA 42 (L) 10/18/2019 1006   CBC    Component Value Date/Time   WBC 17.8 (H) 11/29/2021 0550   RBC 2.80 (L) 11/29/2021 0550   HGB 8.4 (L) 11/29/2021 0550   HGB 13.4 10/21/2021 1547   HCT 25.9 (L) 11/29/2021 0550   HCT 40.8 10/21/2021 1547   PLT 352 11/29/2021 0550   PLT 246 10/21/2021 1547   MCV 92.5 11/29/2021 0550   MCV 91 10/21/2021 1547   MCH 30.0 11/29/2021 0550   MCHC 32.4 11/29/2021 0550   RDW 15.9 (H) 11/29/2021 0550   RDW 14.6 10/21/2021 1547   LYMPHSABS 1.4 11/26/2021 0436   MONOABS 2.8 (H) 11/26/2021 0436   EOSABS 0.3 11/26/2021 0436   BASOSABS 0.1 11/26/2021 0436   Assessment/Plan: Acute kidney injury on chronic kidney disease stage IIIa: -AKI likely ischemic ATN in the setting of septic versus cardiogenic shock.  Started on CRRT 5/26 for extracorporeal volume unloading after he had fixed urine output on Lasix drip that was inadequate for volume unloading. CRRT 5/26-5/31.   New temp HD catheter placed 11/21/21 and has had IHD on 6/3 and 11/25/21.  Will continue with MWF schedule. RIJ TDC  placement 11/28/21 by IR.  no evidence of renal recovery at this junction. Will need outpatient HD to be arranged  Coronary artery disease status post three-vessel CABG: -on aspirin/statin. Left hip/back pain - CT scan with left psoas muscle hematoma vs abscess.  Given leukocytosis, may need IR for aspiration and cultures. Acute on chronic dCHF -UF as tolerated with HD, volume status improving -milrinone stopped 5/29 Shock -possibly from sepsis+cardiogenic. Abx/pressor support per primary service. Sepsis w/u per CCM/  -off pressors and on midodrine now Leukocytosis - now on Unasyn to cover possible aspiration PNA. Atrial fibrillation: -Status post MAZE procedure at the time of CABG. Per primary service, on amio. S/p tee-dccv 6/1.  S/p a successful cardioversion 11/25/21.  Anemia: -Likely associated with  recent surgery and postoperative inflammatory response.  transfuse PRN for hgb <7-iron panel=iron deficient but will hold off on adding Fe given that he is receiving abx and there is no clearcut source of infection. ESA started 6/5 Hyponatremia -Managing with HD and UF, improved Left Pleural Effusion -s/p thora 6/2, 900cc removed  Donetta Potts, MD [facility]

## 2021-11-30 DIAGNOSIS — I480 Paroxysmal atrial fibrillation: Secondary | ICD-10-CM | POA: Diagnosis not present

## 2021-11-30 DIAGNOSIS — Z951 Presence of aortocoronary bypass graft: Secondary | ICD-10-CM | POA: Diagnosis not present

## 2021-11-30 LAB — CBC
HCT: 24.7 % — ABNORMAL LOW (ref 39.0–52.0)
Hemoglobin: 7.7 g/dL — ABNORMAL LOW (ref 13.0–17.0)
MCH: 28.9 pg (ref 26.0–34.0)
MCHC: 31.2 g/dL (ref 30.0–36.0)
MCV: 92.9 fL (ref 80.0–100.0)
Platelets: 342 10*3/uL (ref 150–400)
RBC: 2.66 MIL/uL — ABNORMAL LOW (ref 4.22–5.81)
RDW: 15.8 % — ABNORMAL HIGH (ref 11.5–15.5)
WBC: 15.2 10*3/uL — ABNORMAL HIGH (ref 4.0–10.5)
nRBC: 0 % (ref 0.0–0.2)

## 2021-11-30 LAB — GLUCOSE, CAPILLARY
Glucose-Capillary: 171 mg/dL — ABNORMAL HIGH (ref 70–99)
Glucose-Capillary: 198 mg/dL — ABNORMAL HIGH (ref 70–99)
Glucose-Capillary: 221 mg/dL — ABNORMAL HIGH (ref 70–99)
Glucose-Capillary: 324 mg/dL — ABNORMAL HIGH (ref 70–99)
Glucose-Capillary: 217 mg/dL — ABNORMAL HIGH (ref 70–99)

## 2021-11-30 LAB — MAGNESIUM: Magnesium: 1.8 mg/dL (ref 1.7–2.4)

## 2021-11-30 LAB — RENAL FUNCTION PANEL
Albumin: 2.2 g/dL — ABNORMAL LOW (ref 3.5–5.0)
Anion gap: 16 — ABNORMAL HIGH (ref 5–15)
BUN: 43 mg/dL — ABNORMAL HIGH (ref 8–23)
CO2: 23 mmol/L (ref 22–32)
Calcium: 8.3 mg/dL — ABNORMAL LOW (ref 8.9–10.3)
Chloride: 95 mmol/L — ABNORMAL LOW (ref 98–111)
Creatinine, Ser: 5.43 mg/dL — ABNORMAL HIGH (ref 0.61–1.24)
GFR, Estimated: 11 mL/min — ABNORMAL LOW (ref >60)
Glucose, Bld: 162 mg/dL — ABNORMAL HIGH (ref 70–99)
Phosphorus: 5.1 mg/dL — ABNORMAL HIGH (ref 2.5–4.6)
Potassium: 3.6 mmol/L (ref 3.5–5.1)
Sodium: 134 mmol/L — ABNORMAL LOW (ref 135–145)

## 2021-11-30 MED ORDER — ~~LOC~~ CARDIAC SURGERY, PATIENT & FAMILY EDUCATION: Freq: Once | Status: AC

## 2021-11-30 MED ORDER — AMIODARONE HCL 200 MG PO TABS
400.0000 mg | ORAL_TABLET | Freq: Two times a day (BID) | ORAL | Status: DC
  Administered 2021-11-30 – 2021-12-04 (×8): 400 mg via ORAL
  Filled 2021-11-30 (×8): qty 2

## 2021-11-30 MED ORDER — BISACODYL 10 MG RE SUPP: 10.0000 mg | Freq: Every day | RECTAL | Status: DC | PRN

## 2021-11-30 MED ORDER — HEPARIN SODIUM (PORCINE) 1000 UNIT/ML DIALYSIS: 20.0000 [IU]/kg | INTRAMUSCULAR | Status: DC | PRN

## 2021-11-30 MED ORDER — INSULIN DETEMIR 100 UNIT/ML ~~LOC~~ SOLN
25.0000 [IU] | Freq: Two times a day (BID) | SUBCUTANEOUS | Status: DC
  Administered 2021-11-30 – 2021-12-01 (×3): 25 [IU] via SUBCUTANEOUS
  Filled 2021-11-30 (×7): qty 0.25

## 2021-11-30 MED ORDER — SODIUM CHLORIDE 0.9 % IV SOLN: 250.0000 mL | INTRAVENOUS | Status: DC | PRN

## 2021-11-30 MED ORDER — SODIUM CHLORIDE 0.9% FLUSH: 3.0000 mL | INTRAVENOUS | Status: DC | PRN

## 2021-11-30 MED ORDER — LACTULOSE 10 GM/15ML PO SOLN
10.0000 g | Freq: Every day | ORAL | Status: DC | PRN
Start: 2021-11-30 — End: 2021-12-04

## 2021-11-30 MED ORDER — SODIUM CHLORIDE 0.9% FLUSH
3.0000 mL | Freq: Two times a day (BID) | INTRAVENOUS | Status: DC
  Administered 2021-11-30 – 2021-12-04 (×6): 3 mL via INTRAVENOUS

## 2021-11-30 NOTE — Progress Notes (Signed)
[name] [name] for IV heparin> apixaban Indication: atrial fibrillation  No Known Allergies  Patient Measurements: Height: 5\' 10"  (177.8 cm) Weight: 122.4 kg (269 lb 13.5 oz) IBW/kg (Calculated) : 73 Heparin Dosing Weight: ~ 100 kg  Vital Signs: Temp: 98.4 F (36.9 C) (06/10 0400) Temp Source: Oral (06/09 2317) BP: 129/30 (06/10 0600) Pulse Rate: 70 (06/10 0600)  Labs: Recent Labs    11/28/21 0454 11/28/21 1407 11/29/21 0550 11/29/21 2156 11/30/21 0136  HGB 8.1*  --  8.4*  --  7.7*  HCT 25.4*  --  25.9*  --  24.7*  PLT 316  --  352  --  342  LABPROT  --  16.4*  --   --   --   INR  --  1.3*  --   --   --   HEPARINUNFRC <0.10* 0.22* 0.20* 0.21*  --   CREATININE 5.35*  --  7.44*  --  5.43*     Estimated Creatinine Clearance: 17.3 mL/min (A) (by C-G formula based on SCr of 5.43 mg/dL (H)).   Medical History: Past Medical History:  Diagnosis Date   Atrial fibrillation ([place])    Benign essential HTN 07/30/2014   Bradycardia 01/25/2015   CHF (congestive heart failure) (HCC)    Chronic anticoagulation 97/67/3419   Chronic systolic CHF (congestive heart failure), NYHA class 3 ([place]) 08/30/2014   Overview:  Global ef 30%   CKD (chronic kidney disease)    Class 3 severe obesity in adult ([place]) 04/28/2017   Coronary artery disease    Diabetes mellitus without complication (HCC)    Diabetic neuropathy ([place]) 07/28/2014   Dysrhythmia    Erectile dysfunction    High risk medication use 05/26/2018   Hypertension    Hypertensive heart disease with heart failure ([place]) 07/30/2014   Lumbago    LV dysfunction 04/28/2017   Mixed hyperlipidemia 07/28/2014   OSA (obstructive sleep apnea)    CPAP   Paroxysmal atrial fibrillation ([place]) 07/28/2014   Sinus node dysfunction ([place]) 04/21/2016   Testicular hypofunction    Uncontrolled type 2 diabetes mellitus with microalbuminuric diabetic nephropathy 07/28/2014   Assessment: 66 yo male on chronic  Xarelto for afib.  Now s/p CABG with MAZE, post op AKI CRRT>iHD.   Pharmacy asked to dose IV heparin. Targeting lowering heparin level goal given recent OHS. S/P DCCV x2 now back in Afib> restart IV amiodarone and repeat DCCV 6/9.   Transitioning off heparin gtt to Eliquis at 0800.  Hgb down 8.4 > 7.7, pltc 300s stable.   Goal of Therapy:  Heparin level 0.3-0.5 Monitor platelets by anticoagulation protocol: Yes   Plan:  STOP heparin gtt 0800 START apixaban 5mg  po bid at 0800 Will continue to monitor H/H trend  Laurey Arrow, PharmD PGY1 Pharmacy Resident 11/30/2021  7:31 AM  Please check [email or web address] for unit-specific pharmacy phone numbers.

## 2021-11-30 NOTE — Progress Notes (Signed)
1 Day Post-Op Procedure(s) (LRB): CARDIOVERSION (N/A) Subjective: No complaints  Objective: Vital signs in last 24 hours: Temp:  [97.3 F (36.3 C)-98.6 F (37 C)] 98.1 F (36.7 C) (06/10 0730) Pulse Rate:  [66-84] 68 (06/10 0800) Cardiac Rhythm: Normal sinus rhythm (06/10 0800) Resp:  [12-24] 14 (06/10 0800) BP: (66-155)/(27-94) 92/54 (06/10 0800) SpO2:  [87 %-100 %] 94 % (06/10 0800) Weight:  [122.4 kg-125.5 kg] 122.4 kg (06/10 0500)  Hemodynamic parameters for last 24 hours:    Intake/Output from previous day: 06/09 0701 - 06/10 0700 In: 1381.2 [P.O.:570; I.V.:711.2; IV Piggyback:100] Out: 2500  Intake/Output this shift: Total I/O In: 453.3 [P.O.:240; I.V.:213.3] Out: -   General appearance: alert, cooperative, and no distress Neurologic: intact Heart: regular rate and rhythm Lungs: diminished breath sounds bibasilar Abdomen: normal findings: soft, non-tender Wound: clean and dry  Lab Results: Recent Labs    11/29/21 0550 11/30/21 0136  WBC 17.8* 15.2*  HGB 8.4* 7.7*  HCT 25.9* 24.7*  PLT 352 342   BMET:  Recent Labs    11/29/21 0550 11/30/21 0136  NA 130* 134*  K 4.0 3.6  CL 93* 95*  CO2 22 23  GLUCOSE 187* 162*  BUN 63* 43*  CREATININE 7.44* 5.43*  CALCIUM 8.1* 8.3*    PT/INR:  Recent Labs    11/28/21 1407  LABPROT 16.4*  INR 1.3*   ABG    Component Value Date/Time   PHART 7.332 (L) 11/15/2021 0410   HCO3 24.2 11/16/2021 0414   TCO2 26 11/16/2021 0414   ACIDBASEDEF 3.0 (H) 11/16/2021 0414   O2SAT 56.1 11/21/2021 0612   CBG (last 3)  Recent Labs    11/29/21 2316 11/30/21 0414 11/30/21 0728  GLUCAP 196* 171* 198*    Assessment/Plan: S/P Procedure(s) (LRB): CARDIOVERSION (N/A) Plan for transfer to step-down: see transfer orders NEURO- intact CV- in SR on IV amiodarone- will transition to PO  ASA, statin  Midodrine for vasodilatation  On Eliquis RESP- continue IS, CPAP at night RENAL- HD yesterday with 2500 ml off  [place]  OK ENDO- CBG elevated- increase levemir, continue SSI GI- tolerating PO  + frequent BM- change lactulose and dulcolax to PRN Anemia secondary to ABL- hbg 7.7, monitor Deconditioning- continue PT    LOS: 18 days    [name] [name] 11/30/2021

## 2021-11-30 NOTE — Progress Notes (Signed)
      [address]       [place] [address]             [phone]      Up in chair  BP (!) 121/49 (BP Location: Right Arm)   Pulse 68   Temp 97.8 F (36.6 C) (Oral)   Resp (!) 21   Ht 5\' 10"  (1.778 m)   Wt 122.4 kg   SpO2 95%   BMI 38.72 kg/m   Intake/Output Summary (Last 24 hours) at 11/30/2021 1719 Last data filed at 11/30/2021 1653 Gross per 24 hour  Intake 1733.56 ml  Output 2500 ml  Net -766.44 ml   CBG elevated despite increased levemir dose  Remo Lipps C. Roxan Hockey, MD [facility] [phone]

## 2021-11-30 NOTE — Progress Notes (Signed)
ANTICOAGULATION CONSULT NOTE - Follow Up Consult  Pharmacy Consult for heparin Indication: atrial fibrillation  Labs: Recent Labs    11/27/21 0145 11/28/21 0454 11/28/21 1407 11/29/21 0550 11/29/21 2156  HGB 8.7* 8.1*  --  8.4*  --   HCT 27.7* 25.4*  --  25.9*  --   PLT 309 316  --  352  --   LABPROT 15.9*  --  16.4*  --   --   INR 1.3*  --  1.3*  --   --   HEPARINUNFRC 0.15* <0.10* 0.22* 0.20* 0.21*  CREATININE 6.66* 5.35*  --  7.44*  --     Assessment: 66yo male remains subtherapeutic on heparin after rate change; no infusion issues or signs of bleeding per RN.  Goal of Therapy:  Heparin level 0.3-0.5 units/ml   Plan:  Will increase heparin infusion slightly to 2200 units/hr until off this am for transition to Eliquis.    Wynona Neat, PharmD, BCPS  11/30/2021,1:32 AM

## 2021-11-30 NOTE — Progress Notes (Signed)
Patient ID: [name] [name], male   DOB: 08-Jun-1956, 66 y.o.   MRN: [id number] S: Feels well, no complaints. O:BP (!) 92/54 (BP Location: Left Arm)   Pulse 68   Temp 98.1 F (36.7 C) (Oral)   Resp 13   Ht 5\' 10"  (1.778 m)   Wt 122.4 kg   SpO2 94%   BMI 38.72 kg/m   Intake/Output Summary (Last 24 hours) at 11/30/2021 1103 Last data filed at 11/30/2021 0800 Gross per 24 hour  Intake 1665.55 ml  Output 2500 ml  Net -834.45 ml   Intake/Output: I/O last 3 completed shifts: In: 1872.1 [P.O.:570; I.V.:1102.1; IV Piggyback:200] Out: 2500 [Other:2500]  Intake/Output this shift:  Total I/O In: 453.3 [P.O.:240; I.V.:213.3] Out: -  Weight change: -0.032 kg Gen: NAD CVS: RRR Resp: CTA Abd: +BS, soft, NT/ND Ext: trace pretibial edema  Recent Labs  Lab 11/25/21 0358 11/25/21 2210 11/26/21 0436 11/27/21 0145 11/28/21 0454 11/29/21 0550 11/30/21 0136  NA 130* 127* 130* 131* 130* 130* 134*  K 4.6 3.6 4.1 4.3 3.9 4.0 3.6  CL 94* 88* 91* 93* 93* 93* 95*  CO2 21* 22 23 20* 25 22 23   GLUCOSE 187* 201* 241* 243* 235* 187* 162*  BUN 52* 31* 35* 52* 39* 63* 43*  CREATININE 6.09* 4.01* 4.92* 6.66* 5.35* 7.44* 5.43*  ALBUMIN 2.9* 2.8* 2.5* 2.6* 2.2* 2.3* 2.2*  CALCIUM 8.5* 7.9* 8.2* 8.4* 8.0* 8.1* 8.3*  PHOS 6.0* 3.9 5.2* 7.8* 5.8* 6.9* 5.1*   Liver Function Tests: Recent Labs  Lab 11/28/21 0454 11/29/21 0550 11/30/21 0136  ALBUMIN 2.2* 2.3* 2.2*   No results for input(s): "LIPASE", "AMYLASE" in the last 168 hours. No results for input(s): "AMMONIA" in the last 168 hours. CBC: Recent Labs  Lab 11/26/21 0436 11/26/21 1513 11/27/21 0145 11/28/21 0454 11/29/21 0550 11/30/21 0136  WBC 25.2*  25.4* 25.2* 24.5* 17.5* 17.8* 15.2*  NEUTROABS 20.1*  --   --   --   --   --   HGB 7.6*  7.6* 8.5* 8.7* 8.1* 8.4* 7.7*  HCT 24.0*  23.8* 26.2* 27.7* 25.4* 25.9* 24.7*  MCV 94.9  93.0 91.3 94.2 92.7 92.5 92.9  PLT 293  287 293 309 316 352 342   Cardiac Enzymes: No results for  input(s): "CKTOTAL", "CKMB", "CKMBINDEX", "TROPONINI" in the last 168 hours. CBG: Recent Labs  Lab 11/29/21 1600 11/29/21 2038 11/29/21 2316 11/30/21 0414 11/30/21 0728  GLUCAP 375* 134* 196* 171* 198*    Iron Studies: No results for input(s): "IRON", "TIBC", "TRANSFERRIN", "FERRITIN" in the last 72 hours. Studies/Results: DG Chest Port 1 View  Result Date: 11/29/2021 CLINICAL DATA:  Reason for exam: sob Patient denies any sob or chest pains. Reports to have cardioversion today. Hx of cardioversion 11/25/2021, CABG x3 11/12/2021. EXAM: PORTABLE CHEST - 1 VIEW COMPARISON:  the previous day's study FINDINGS: Tunneled right IJ hemodialysis catheter extends to the proximal RA. Non tunneled left IJ catheter is directed towards the lateral wall of the proximal SVC. No pneumothorax. Persistent patchy airspace opacities in the mid and lower left lung. Right lung clear. Heart size upper limits normal. CABG graft markers. Aortic Atherosclerosis (ICD10-170.0). No effusion. Sternotomy wires. IMPRESSION: Persistent patchy left mid and lower lung airspace opacities. Electronically Signed   By: Lucrezia Europe M.D.   On: 11/29/2021 08:16   IR Fluoro Guide CV Line Right  Result Date: 11/29/2021 CLINICAL DATA:  Renal failure, needs durable venous access for hemodialysis EXAM: TUNNELED HEMODIALYSIS CATHETER PLACEMENT WITH ULTRASOUND AND  FLUOROSCOPIC GUIDANCE TECHNIQUE: The procedure, risks, benefits, and alternatives were explained to the patient. Questions regarding the procedure were encouraged and answered. The patient understands and consents to the procedure. As antibiotic prophylaxis, cefazolin 2 g was ordered pre-procedure and administered intravenously within one hour of incision.Patency of the right IJ vein was confirmed with ultrasound with image documentation. An appropriate skin site was determined. Region was prepped using maximum barrier technique including cap and mask, sterile gown, sterile gloves, large  sterile sheet, and Chlorhexidine as cutaneous antisepsis. The region was infiltrated locally with 1% lidocaine. sedationUnder real-time ultrasound guidance, the right IJ vein was accessed with a 21 gauge micropuncture needle; the needle tip within the vein was confirmed with ultrasound image documentation. Needle exchanged over the 018 guidewire for transitional dilator, which allowed advancement of a Benson wire into the IVC. Over this, an MPA catheter was advanced. A Palindrome 23 hemodialysis catheter was tunneled from the right anterior chest wall approach to the right IJ dermatotomy site. The MPA catheter was exchanged over an Amplatz wire for serial vascular dilators which allow placement of a peel-away sheath, through which the catheter was advanced under intermittent fluoroscopy, positioned with its tips in the proximal and midright atrium. Spot chest radiograph confirms good catheter position. No pneumothorax. Catheter was flushed and primed per protocol. Catheter secured externally with O Prolene sutures. The right IJ dermatotomy site was closed with Dermabond. COMPLICATIONS: COMPLICATIONS None immediate FLUOROSCOPY: Radiation Exposure Index (as provided by the fluoroscopic device): 25 mGy air Kerma COMPARISON:  None Available. IMPRESSION: 1. Technically successful placement of tunneled right IJ hemodialysis catheter with ultrasound and fluoroscopic guidance. Ready for routine use. ACCESS: Remains approachable for percutaneous intervention as needed. Electronically Signed   By: Lucrezia Europe M.D.   On: 11/29/2021 08:07   IR US Guide Vasc Access Right  Result Date: 11/29/2021 CLINICAL DATA:  Renal failure, needs durable venous access for hemodialysis EXAM: TUNNELED HEMODIALYSIS CATHETER PLACEMENT WITH ULTRASOUND AND FLUOROSCOPIC GUIDANCE TECHNIQUE: The procedure, risks, benefits, and alternatives were explained to the patient. Questions regarding the procedure were encouraged and answered. The patient  understands and consents to the procedure. As antibiotic prophylaxis, cefazolin 2 g was ordered pre-procedure and administered intravenously within one hour of incision.Patency of the right IJ vein was confirmed with ultrasound with image documentation. An appropriate skin site was determined. Region was prepped using maximum barrier technique including cap and mask, sterile gown, sterile gloves, large sterile sheet, and Chlorhexidine as cutaneous antisepsis. The region was infiltrated locally with 1% lidocaine. sedationUnder real-time ultrasound guidance, the right IJ vein was accessed with a 21 gauge micropuncture needle; the needle tip within the vein was confirmed with ultrasound image documentation. Needle exchanged over the 018 guidewire for transitional dilator, which allowed advancement of a Benson wire into the IVC. Over this, an MPA catheter was advanced. A Palindrome 23 hemodialysis catheter was tunneled from the right anterior chest wall approach to the right IJ dermatotomy site. The MPA catheter was exchanged over an Amplatz wire for serial vascular dilators which allow placement of a peel-away sheath, through which the catheter was advanced under intermittent fluoroscopy, positioned with its tips in the proximal and midright atrium. Spot chest radiograph confirms good catheter position. No pneumothorax. Catheter was flushed and primed per protocol. Catheter secured externally with O Prolene sutures. The right IJ dermatotomy site was closed with Dermabond. COMPLICATIONS: COMPLICATIONS None immediate FLUOROSCOPY: Radiation Exposure Index (as provided by the fluoroscopic device): 25 mGy air Kerma COMPARISON:  None  Available. IMPRESSION: 1. Technically successful placement of tunneled right IJ hemodialysis catheter with ultrasound and fluoroscopic guidance. Ready for routine use. ACCESS: Remains approachable for percutaneous intervention as needed. Electronically Signed   By: Lucrezia Europe M.D.   On:  11/29/2021 08:07    amiodarone  400 mg Oral BID   apixaban  5 mg Oral BID   aspirin EC  81 mg Oral Daily   atorvastatin  80 mg Oral Daily   Chlorhexidine Gluconate Cloth  6 each Topical Daily   [facility] Patient [facility] Family Education   Does not apply Once   darbepoetin (ARANESP) injection - DIALYSIS  100 mcg Intravenous Q Mon-HD   docusate sodium  200 mg Oral Daily   guaiFENesin  1,200 mg Oral BID   insulin aspart  0-24 Units Subcutaneous TID WC   insulin detemir  25 Units Subcutaneous BID   lidocaine  1 patch Transdermal Q24H   metoCLOPramide (REGLAN) injection  5 mg Intravenous Q8H   midodrine  10 mg Oral TID WC   multivitamin  1 tablet Oral QHS   pantoprazole  40 mg Oral Daily   sodium chloride flush  3 mL Intravenous Q12H    BMET    Component Value Date/Time   NA 134 (L) 11/30/2021 0136   NA 139 10/21/2021 1547   K 3.6 11/30/2021 0136   CL 95 (L) 11/30/2021 0136   CO2 23 11/30/2021 0136   GLUCOSE 162 (H) 11/30/2021 0136   BUN 43 (H) 11/30/2021 0136   BUN 29 (H) 10/21/2021 1547   CREATININE 5.43 (H) 11/30/2021 0136   CALCIUM 8.3 (L) 11/30/2021 0136   GFRNONAA 11 (L) 11/30/2021 0136   GFRAA 42 (L) 10/18/2019 1006   CBC    Component Value Date/Time   WBC 15.2 (H) 11/30/2021 0136   RBC 2.66 (L) 11/30/2021 0136   HGB 7.7 (L) 11/30/2021 0136   HGB 13.4 10/21/2021 1547   HCT 24.7 (L) 11/30/2021 0136   HCT 40.8 10/21/2021 1547   PLT 342 11/30/2021 0136   PLT 246 10/21/2021 1547   MCV 92.9 11/30/2021 0136   MCV 91 10/21/2021 1547   MCH 28.9 11/30/2021 0136   MCHC 31.2 11/30/2021 0136   RDW 15.8 (H) 11/30/2021 0136   RDW 14.6 10/21/2021 1547   LYMPHSABS 1.4 11/26/2021 0436   MONOABS 2.8 (H) 11/26/2021 0436   EOSABS 0.3 11/26/2021 0436   BASOSABS 0.1 11/26/2021 0436    Assessment/Plan: Acute kidney injury on chronic kidney disease stage IIIa: -AKI likely ischemic ATN in the setting of septic versus cardiogenic shock.  Started on CRRT 5/26 for  extracorporeal volume unloading after he had fixed urine output on Lasix drip that was inadequate for volume unloading. CRRT 5/26-5/31.   New temp HD catheter placed 11/21/21 and started IHD on 6/3 and tolerating it well.  Will continue with MWF schedule. RIJ TDC placement 11/28/21 by IR.  no evidence of renal recovery at this junction. Will need outpatient HD to be arranged  Coronary artery disease status post three-vessel CABG: -on aspirin/statin. Left hip/back pain - CT scan with left psoas muscle hematoma vs abscess.  Given leukocytosis, may need IR for aspiration and cultures. Acute on chronic dCHF -UF as tolerated with HD, volume status improving -milrinone stopped 5/29 Shock -possibly from sepsis+cardiogenic. Abx/pressor support per primary service. Sepsis w/u per CCM/  -off pressors and on midodrine now Leukocytosis - now on Unasyn to cover possible aspiration PNA. Atrial fibrillation: -Status post MAZE  procedure at the time of CABG. Per primary service, on amio. S/p tee-dccv 6/1.  S/p a successful cardioversion 11/25/21.  Anemia: -Likely associated with recent surgery and postoperative inflammatory response.  transfuse PRN for hgb <7-iron panel=iron deficient but will hold off on adding Fe given that he is receiving abx and there is no clearcut source of infection. ESA started 6/5 Hyponatremia -Managing with HD and UF, improved Left Pleural Effusion -s/p thora 6/2, 900cc removed  Donetta Potts, MD [facility]

## 2021-11-30 NOTE — Progress Notes (Signed)
Pt placed on cpap for the night, tol well at this time

## 2021-11-30 NOTE — Progress Notes (Signed)
Patient ID: [name] [name], male   DOB: 1956/02/19, 66 y.o.   MRN: [id number]     Advanced Heart Failure Rounding Note  PCP-Cardiologist: None   Subjective:    Abx started 5/25 for suspected septic shock.  Initial abx completed and afebrile. WBC trending down to 17. CCM added rocephin 06/01 for possible aspiration. On 6/2, abx transitioned to vanc/cefepime and micafungin added (yeast in resp culture). On 6/6, abx transitioned to Unasyn and micafungin/vancomycin stopped.   6/7 s/p aspiration of RP collection, this was likely hematoma and not abscess.   S/p TEE/DCCV to SR 06/01. 6/5 S/P DC-CV--->back in A fib. DCCV again 6/9, remains in NSR today. He remains on amiodarone 30 mg/hr and has been transitioned to apixaban.   Left thoracentesis 6/2 with 900 cc off.   Echo: Difficult study. LV EF 55-60%, RV probably normal, no pericardial effusion.   Getting MWF HD, had yesterday.   Hgb mildly lower at 7.7 today.    Objective:   Weight Range: 122.4 kg Body mass index is 38.72 kg/m.   Vital Signs:   Temp:  [97.3 F (36.3 C)-98.6 F (37 C)] 98.1 F (36.7 C) (06/10 0730) Pulse Rate:  [62-84] 68 (06/10 0800) Resp:  [12-24] 14 (06/10 0800) BP: (66-168)/(27-94) 92/54 (06/10 0800) SpO2:  [87 %-100 %] 94 % (06/10 0800) Weight:  [122.4 kg-125.5 kg] 122.4 kg (06/10 0500) Last BM Date : 11/26/21  Weight change: Filed Weights   11/29/21 1628 11/29/21 1955 11/30/21 0500  Weight: 125.5 kg 123 kg 122.4 kg    Intake/Output:   Intake/Output Summary (Last 24 hours) at 11/30/2021 0807 Last data filed at 11/30/2021 0800 Gross per 24 hour  Intake 1834.47 ml  Output 2500 ml  Net -665.53 ml     Physical Exam  General: NAD Neck: JVP 8 cm, no thyromegaly or thyroid nodule.  Lungs: Clear to auscultation bilaterally with normal respiratory effort. CV: Nondisplaced PMI.  Heart regular S1/S2, no S3/S4, no murmur.  Trace ankle edema.   Abdomen: Soft, nontender, no hepatosplenomegaly, no  distention.  Skin: Intact without lesions or rashes.  Neurologic: Alert and oriented x 3.  Psych: Normal affect. Extremities: No clubbing or cyanosis.  HEENT: Normal.   Telemetry   NSR 70s (personally reviewed)  Labs    CBC Recent Labs    11/29/21 0550 11/30/21 0136  WBC 17.8* 15.2*  HGB 8.4* 7.7*  HCT 25.9* 24.7*  MCV 92.5 92.9  PLT 352 924   Basic Metabolic Panel Recent Labs    11/29/21 0550 11/30/21 0136  NA 130* 134*  K 4.0 3.6  CL 93* 95*  CO2 22 23  GLUCOSE 187* 162*  BUN 63* 43*  CREATININE 7.44* 5.43*  CALCIUM 8.1* 8.3*  MG 2.1 1.8  PHOS 6.9* 5.1*   Liver Function Tests Recent Labs    11/29/21 0550 11/30/21 0136  ALBUMIN 2.3* 2.2*   No results for input(s): "LIPASE", "AMYLASE" in the last 72 hours. Cardiac Enzymes No results for input(s): "CKTOTAL", "CKMB", "CKMBINDEX", "TROPONINI" in the last 72 hours.  BNP: BNP (last 3 results) No results for input(s): "BNP" in the last 8760 hours.  ProBNP (last 3 results) No results for input(s): "PROBNP" in the last 8760 hours.   D-Dimer No results for input(s): "DDIMER" in the last 72 hours. Hemoglobin A1C No results for input(s): "HGBA1C" in the last 72 hours. Fasting Lipid Panel No results for input(s): "CHOL", "HDL", "LDLCALC", "TRIG", "CHOLHDL", "LDLDIRECT" in the last 72 hours. Thyroid Function Tests  No results for input(s): "TSH", "T4TOTAL", "T3FREE", "THYROIDAB" in the last 72 hours.  Invalid input(s): "FREET3"  Other results:   Imaging    No results found.   Medications:     Scheduled Medications:  apixaban  5 mg Oral BID   aspirin EC  81 mg Oral Daily   atorvastatin  80 mg Oral Daily   bisacodyl  10 mg Rectal Q0600   Chlorhexidine Gluconate Cloth  6 each Topical Daily   darbepoetin (ARANESP) injection - DIALYSIS  100 mcg Intravenous Q Mon-HD   docusate sodium  200 mg Oral Daily   guaiFENesin  1,200 mg Oral BID   insulin aspart  0-24 Units Subcutaneous TID WC   insulin  detemir  22 Units Subcutaneous BID   lactulose  10 g Oral Daily   lidocaine  1 patch Transdermal Q24H   metoCLOPramide (REGLAN) injection  5 mg Intravenous Q8H   midodrine  10 mg Oral TID WC   multivitamin  1 tablet Oral QHS   pantoprazole  40 mg Oral Daily   sodium chloride flush  3 mL Intravenous Q12H    Infusions:  sodium chloride Stopped (11/26/21 2303)   amiodarone 30 mg/hr (11/30/21 0800)   ampicillin-sulbactam (UNASYN) IV Stopped (11/29/21 2248)   anticoagulant sodium citrate      PRN Medications: acetaminophen, alteplase, anticoagulant sodium citrate, heparin, ipratropium-albuterol, lidocaine (PF), lidocaine-prilocaine, magic mouthwash w/lidocaine, ondansetron (ZOFRAN) IV, oxyCODONE, pentafluoroprop-tetrafluoroeth, sodium chloride flush, traMADol    Patient Profile   66 y.o. male with history of CM with recovered EF, persistent atrial fibrillation, DM II, CKD, sick sinus syndrome, OSA, prior smoker. Underwent CABG X 3 and MAZE/PVI on 05/23>>post-op decrease in UOP and AKI with volume overload>>postcardiotomy shock requiring inotrope and pressor support  Assessment/Plan   1. Shock: Echo reviewed, LV EF 55-60%, RV poorly visualized but probably near-normal, no significant pericardial effusion. Suspect sepsis physiology with distributive shock.  Improved with broad spectrum abx. Lactate has cleared. Bcx 5/25 remain NGTD. Now off pressors/resolved.   - Will keep midodrine for now at 10 mg tid as occasional SBP into 90s noted.  2. Acute on chronic primarily diastolic CHF: LV EF 17-51%, RV poorly visualized but probably near-normal, no significant pericardial effusion.  Patient is still volume overloaded, suspect primarily right heart failure physiology.  CVVHD started on 5/26. Now getting iHD and tolerating.  Volume status looks ok.  3. AKI on CKD stage 3: Baseline SCr 1.3-1.5 .  Suspect due to shock/ATN as well as elevated renal venous pressure in setting of volume overload, ileus,  abdominal distention. CVVHD started 5/26. Remains nearly anuric.  Lost HD line 5/31, replaced 06/01. Now getting iHD. Minimal urine output.  - MWF HD.   4. CAD: S/p CABG. No chest pain.  - Continue ASA, statin.  5. Atrial fibrillation with RVR: Has had Maze and LA appendage clip at time of CABG on 11/12/21. S/p TEE/DCCV to SR on 06//01. Back in AF by 6/2.  DCCV 6/5 and again on 6/9 to NSR.  Remains in NSR today.  - Eliquis 5 mg bid.  - Continue amiodarone gtt today, transition tomorrow to amiodarone 400 mg po bid if he remains in NSR.  6. DM2: SSI.  7. ID: Persistent leukocytosis.  Septic shock initially, resolved.  Blood cultures NGTD.  Treated for HCAP, completed vanc and cefepime.  With WBCs rising again and concern for aspiration, he was started back on vanc/cefepime and micafungin given yeast in sputum. CT chest/abd/pelvis with left retroperitoneal  fluid collection (?hemorrhage vs abscess), bilateral lower lobe infiltrates vs atelectasis, moderate loculated left pleural effusion.  He was transitioned 6/6 to Unasyn to cover aspiration PNA. IR aspiration of RP fluid collection => likely hematoma and not abscess. Afebrile, WBCs 17 => 15.  - Last day of Unasyn today.  8. Ileus: Improved.  9. Pulmonary: Respiratory status improving with fluid removal and abx. Thoracentesis on 6/2.  - HD for volume removal  - CCM following.   10. RP fluid collection: 6/7 S/P aspiration by IR. Likely hematoma.  11. Anemia: RP hematoma.  Hgb 7.7 today.  - Transfuse < 7.5.   [place] for progressive.  We will see again Monday unless called.   Length of Stay: 52  Loralie Champagne, MD  11/30/2021, 8:07 AM  Advanced Heart Failure Team Pager [phone] (M-F; 7a - 5p)  Please contact [facility] for night-coverage after hours (5p -7a ) and weekends on [email or web address]

## 2021-11-30 NOTE — Plan of Care (Signed)

## 2021-12-01 ENCOUNTER — Inpatient Hospital Stay (HOSPITAL_COMMUNITY): Payer: Medicare HMO

## 2021-12-01 LAB — RENAL FUNCTION PANEL
Albumin: 2.2 g/dL — ABNORMAL LOW (ref 3.5–5.0)
Anion gap: 16 — ABNORMAL HIGH (ref 5–15)
BUN: 61 mg/dL — ABNORMAL HIGH (ref 8–23)
CO2: 22 mmol/L (ref 22–32)
Calcium: 8.3 mg/dL — ABNORMAL LOW (ref 8.9–10.3)
Chloride: 92 mmol/L — ABNORMAL LOW (ref 98–111)
Creatinine, Ser: 7.1 mg/dL — ABNORMAL HIGH (ref 0.61–1.24)
GFR, Estimated: 8 mL/min — ABNORMAL LOW (ref >60)
Glucose, Bld: 224 mg/dL — ABNORMAL HIGH (ref 70–99)
Phosphorus: 6.7 mg/dL — ABNORMAL HIGH (ref 2.5–4.6)
Potassium: 3.6 mmol/L (ref 3.5–5.1)
Sodium: 130 mmol/L — ABNORMAL LOW (ref 135–145)

## 2021-12-01 LAB — CBC
HCT: 23.7 % — ABNORMAL LOW (ref 39.0–52.0)
Hemoglobin: 7.8 g/dL — ABNORMAL LOW (ref 13.0–17.0)
MCH: 29.9 pg (ref 26.0–34.0)
MCHC: 32.9 g/dL (ref 30.0–36.0)
MCV: 90.8 fL (ref 80.0–100.0)
Platelets: 319 10*3/uL (ref 150–400)
RBC: 2.61 MIL/uL — ABNORMAL LOW (ref 4.22–5.81)
RDW: 15.8 % — ABNORMAL HIGH (ref 11.5–15.5)
WBC: 13.7 10*3/uL — ABNORMAL HIGH (ref 4.0–10.5)
nRBC: 0 % (ref 0.0–0.2)

## 2021-12-01 LAB — MAGNESIUM: Magnesium: 2 mg/dL (ref 1.7–2.4)

## 2021-12-01 LAB — GLUCOSE, CAPILLARY
Glucose-Capillary: 166 mg/dL — ABNORMAL HIGH (ref 70–99)
Glucose-Capillary: 218 mg/dL — ABNORMAL HIGH (ref 70–99)
Glucose-Capillary: 222 mg/dL — ABNORMAL HIGH (ref 70–99)

## 2021-12-01 MED ORDER — MIDODRINE HCL 5 MG PO TABS
10.0000 mg | ORAL_TABLET | Freq: Three times a day (TID) | ORAL | Status: DC
  Administered 2021-12-02: 10 mg via ORAL
  Filled 2021-12-01: qty 2

## 2021-12-01 NOTE — Plan of Care (Signed)
  Problem: Education: Goal: Knowledge of disease or condition will improve Outcome: Progressing Goal: Knowledge of the prescribed therapeutic regimen will improve Outcome: Progressing   Problem: Cardiac: Goal: Will achieve and/or maintain hemodynamic stability Outcome: Progressing

## 2021-12-01 NOTE — Progress Notes (Signed)
2 Days Post-Op Procedure(s) (LRB): CARDIOVERSION (N/A) Subjective: C/o hip pain (chronic)  Objective: Vital signs in last 24 hours: Temp:  [97.8 F (36.6 C)-98.8 F (37.1 C)] 98.6 F (37 C) (06/11 0700) Cardiac Rhythm: Normal sinus rhythm (06/10 1959) Resp:  [13-21] 18 (06/11 0548) BP: (90-130)/(41-67) 130/67 (06/11 0548) SpO2:  [95 %-98 %] 98 % (06/11 0548) Weight:  [127.3 kg] 127.3 kg (06/11 0500)  Hemodynamic parameters for last 24 hours:    Intake/Output from previous day: 06/10 0701 - 06/11 0700 In: 838.2 [P.O.:592; I.V.:246.2] Out: -  Intake/Output this shift: No intake/output data recorded.  General appearance: alert, cooperative, and no distress Neurologic: intact Heart: regular rate and rhythm Lungs: clear to auscultation bilaterally Abdomen: normal findings: soft, non-tender Wound: clean and intact  Lab Results: Recent Labs    11/30/21 0136 12/01/21 0111  WBC 15.2* 13.7*  HGB 7.7* 7.8*  HCT 24.7* 23.7*  PLT 342 319   BMET:  Recent Labs    11/30/21 0136 12/01/21 0111  NA 134* 130*  K 3.6 3.6  CL 95* 92*  CO2 23 22  GLUCOSE 162* 224*  BUN 43* 61*  CREATININE 5.43* 7.10*  CALCIUM 8.3* 8.3*    PT/INR:  Recent Labs    11/28/21 1407  LABPROT 16.4*  INR 1.3*   ABG    Component Value Date/Time   PHART 7.332 (L) 11/15/2021 0410   HCO3 24.2 11/16/2021 0414   TCO2 26 11/16/2021 0414   ACIDBASEDEF 3.0 (H) 11/16/2021 0414   O2SAT 56.1 11/21/2021 0612   CBG (last 3)  Recent Labs    11/30/21 2015 12/01/21 0034 12/01/21 0708  GLUCAP 217* 218* 166*    Assessment/Plan: S/P Procedure(s) (LRB): CARDIOVERSION (N/A) Plan for transfer to step-down: see transfer orders Awaiting bed on 2 C NEURO- intact CV- holding SR on PO amiodarone   On midodrine, BP improved, amy be able to start weaning  ASA, statin RESP- CXR Ok, some opacity left base unchanged  Continue IS, CPAP RENAL- HD Friday, lytes [place] ENDO- CBG better this AM, monitor  closely  Levemir + SSI GI- tolerating PO Anemia- Hgb stable Deconditioning- continue to mobilize as tolerated   LOS: 19 days    Melrose Nakayama 12/01/2021

## 2021-12-01 NOTE — Progress Notes (Signed)
Placed patient on CPAP, 10.0 cm H20. 21% RT will monitor as needed.

## 2021-12-01 NOTE — Progress Notes (Signed)
Patient ID: [name] [name], male   DOB: Dec 30, 1955, 66 y.o.   MRN: [id number] S: Feels well, no new complaints. O:BP 104/64 (BP Location: Left Arm)   Pulse 68   Temp 98.6 F (37 C) (Axillary)   Resp 18   Ht 5\' 10"  (1.778 m)   Wt 127.3 kg   SpO2 98%   BMI 40.27 kg/m   Intake/Output Summary (Last 24 hours) at 12/01/2021 1031 Last data filed at 11/30/2021 1900 Gross per 24 hour  Intake 384.85 ml  Output --  Net 384.85 ml   Intake/Output: I/O last 3 completed shifts: In: 1833.6 [P.O.:1162; I.V.:571.6; IV Piggyback:100] Out: 2500 [Other:2500]  Intake/Output this shift:  No intake/output data recorded. Weight change: 3.5 kg Gen: NAD CVS: RRR Resp:CTA Abd: +BS, soft, NT/ND ESP:[id number]+ pretibial edema bilaterally  Recent Labs  Lab 11/25/21 2210 11/26/21 0436 11/27/21 0145 11/28/21 0454 11/29/21 0550 11/30/21 0136 12/01/21 0111  NA 127* 130* 131* 130* 130* 134* 130*  K 3.6 4.1 4.3 3.9 4.0 3.6 3.6  CL 88* 91* 93* 93* 93* 95* 92*  CO2 22 23 20* 25 22 23 22   GLUCOSE 201* 241* 243* 235* 187* 162* 224*  BUN 31* 35* 52* 39* 63* 43* 61*  CREATININE 4.01* 4.92* 6.66* 5.35* 7.44* 5.43* 7.10*  ALBUMIN 2.8* 2.5* 2.6* 2.2* 2.3* 2.2* 2.2*  CALCIUM 7.9* 8.2* 8.4* 8.0* 8.1* 8.3* 8.3*  PHOS 3.9 5.2* 7.8* 5.8* 6.9* 5.1* 6.7*   Liver Function Tests: Recent Labs  Lab 11/29/21 0550 11/30/21 0136 12/01/21 0111  ALBUMIN 2.3* 2.2* 2.2*   No results for input(s): "LIPASE", "AMYLASE" in the last 168 hours. No results for input(s): "AMMONIA" in the last 168 hours. CBC: Recent Labs  Lab 11/26/21 0436 11/26/21 1513 11/27/21 0145 11/28/21 0454 11/29/21 0550 11/30/21 0136 12/01/21 0111  WBC 25.2*  25.4*   < > 24.5* 17.5* 17.8* 15.2* 13.7*  NEUTROABS 20.1*  --   --   --   --   --   --   HGB 7.6*  7.6*   < > 8.7* 8.1* 8.4* 7.7* 7.8*  HCT 24.0*  23.8*   < > 27.7* 25.4* 25.9* 24.7* 23.7*  MCV 94.9  93.0   < > 94.2 92.7 92.5 92.9 90.8  PLT 293  287   < > 309 316 352 342 319   < >  = values in this interval not displayed.   Cardiac Enzymes: No results for input(s): "CKTOTAL", "CKMB", "CKMBINDEX", "TROPONINI" in the last 168 hours. CBG: Recent Labs  Lab 11/30/21 1124 11/30/21 1626 11/30/21 2015 12/01/21 0034 12/01/21 0708  GLUCAP 221* 324* 217* 218* 166*    Iron Studies: No results for input(s): "IRON", "TIBC", "TRANSFERRIN", "FERRITIN" in the last 72 hours. Studies/Results: DG Chest Port 1 View  Result Date: 12/01/2021 CLINICAL DATA:  Status post CABG. EXAM: PORTABLE CHEST 1 VIEW COMPARISON:  11/29/2021 FINDINGS: The cardio pericardial silhouette is enlarged. Similar appearance patchy airspace disease at the left base. Right IJ central line tip overlies the right atrium. Left IJ central line is been removed in the interval. Telemetry leads overlie the chest. IMPRESSION: 1. Interval removal of left IJ central line. 2. Similar appearance of patchy airspace disease at the left base. Electronically Signed   By: Misty Stanley M.D.   On: 12/01/2021 09:53    amiodarone  400 mg Oral BID   apixaban  5 mg Oral BID   aspirin EC  81 mg Oral Daily   atorvastatin  80 mg Oral  Daily   Chlorhexidine Gluconate Cloth  6 each Topical Daily   darbepoetin (ARANESP) injection - DIALYSIS  100 mcg Intravenous Q Mon-HD   docusate sodium  200 mg Oral Daily   guaiFENesin  1,200 mg Oral BID   insulin aspart  0-24 Units Subcutaneous TID WC   insulin detemir  25 Units Subcutaneous BID   lidocaine  1 patch Transdermal Q24H   metoCLOPramide (REGLAN) injection  5 mg Intravenous Q8H   midodrine  10 mg Oral TID WC   multivitamin  1 tablet Oral QHS   pantoprazole  40 mg Oral Daily   sodium chloride flush  3 mL Intravenous Q12H    BMET    Component Value Date/Time   NA 130 (L) 12/01/2021 0111   NA 139 10/21/2021 1547   K 3.6 12/01/2021 0111   CL 92 (L) 12/01/2021 0111   CO2 22 12/01/2021 0111   GLUCOSE 224 (H) 12/01/2021 0111   BUN 61 (H) 12/01/2021 0111   BUN 29 (H) 10/21/2021 1547    CREATININE 7.10 (H) 12/01/2021 0111   CALCIUM 8.3 (L) 12/01/2021 0111   GFRNONAA 8 (L) 12/01/2021 0111   GFRAA 42 (L) 10/18/2019 1006   CBC    Component Value Date/Time   WBC 13.7 (H) 12/01/2021 0111   RBC 2.61 (L) 12/01/2021 0111   HGB 7.8 (L) 12/01/2021 0111   HGB 13.4 10/21/2021 1547   HCT 23.7 (L) 12/01/2021 0111   HCT 40.8 10/21/2021 1547   PLT 319 12/01/2021 0111   PLT 246 10/21/2021 1547   MCV 90.8 12/01/2021 0111   MCV 91 10/21/2021 1547   MCH 29.9 12/01/2021 0111   MCHC 32.9 12/01/2021 0111   RDW 15.8 (H) 12/01/2021 0111   RDW 14.6 10/21/2021 1547   LYMPHSABS 1.4 11/26/2021 0436   MONOABS 2.8 (H) 11/26/2021 0436   EOSABS 0.3 11/26/2021 0436   BASOSABS 0.1 11/26/2021 0436    Assessment/Plan: Acute kidney injury on chronic kidney disease stage IIIa: -AKI likely ischemic ATN in the setting of septic versus cardiogenic shock.  Started on CRRT 5/26 for extracorporeal volume unloading after he had fixed urine output on Lasix drip that was inadequate for volume unloading. CRRT 5/26-5/31.   New temp HD catheter placed 11/21/21 and started IHD on 6/3 and tolerating it well.  Will continue with MWF schedule. RIJ TDC placement 11/28/21 by IR.  no evidence of renal recovery at this junction. Will need outpatient HD to be arranged once closer to discharge Coronary artery disease status post three-vessel CABG: -on aspirin/statin. Left hip/back pain - CT scan with left psoas muscle hematoma vs abscess.  WBC improving.  Plan per primary svc Acute on chronic dCHF -UF as tolerated with HD, volume status improving -milrinone stopped 5/29 Shock -possibly from sepsis+cardiogenic. Abx/pressor support per primary service. Sepsis w/u per CCM/  -off pressors and on midodrine now. Leukocytosis - completed Unasyn to cover possible aspiration PNA. WBC improving. Atrial fibrillation: -Status post MAZE procedure at the time of CABG. Per primary service, on amio. S/p tee-dccv 6/1.  S/p a successful  cardioversion 11/25/21.  Anemia: -Likely associated with recent surgery and postoperative inflammatory response.  transfuse PRN for hgb <7-iron panel=iron deficient but will hold off on adding Fe given that he is receiving abx and there is no clearcut source of infection. ESA started 6/5 Hyponatremia -Managing with HD and UF, improved Left Pleural Effusion -s/p thora 6/2, 900cc removed  Disposition - unclear if he will require SNF or CIR.  Will need outpatient HD arrangements to be made  Donetta Potts, MD [facility]

## 2021-12-02 ENCOUNTER — Encounter (HOSPITAL_COMMUNITY): Payer: Self-pay | Admitting: Cardiology

## 2021-12-02 DIAGNOSIS — Z951 Presence of aortocoronary bypass graft: Secondary | ICD-10-CM | POA: Diagnosis not present

## 2021-12-02 LAB — RENAL FUNCTION PANEL
Albumin: 2.3 g/dL — ABNORMAL LOW (ref 3.5–5.0)
Anion gap: 16 — ABNORMAL HIGH (ref 5–15)
BUN: 76 mg/dL — ABNORMAL HIGH (ref 8–23)
CO2: 20 mmol/L — ABNORMAL LOW (ref 22–32)
Calcium: 8.4 mg/dL — ABNORMAL LOW (ref 8.9–10.3)
Chloride: 94 mmol/L — ABNORMAL LOW (ref 98–111)
Creatinine, Ser: 8.1 mg/dL — ABNORMAL HIGH (ref 0.61–1.24)
GFR, Estimated: 7 mL/min — ABNORMAL LOW (ref >60)
Glucose, Bld: 245 mg/dL — ABNORMAL HIGH (ref 70–99)
Phosphorus: 7.2 mg/dL — ABNORMAL HIGH (ref 2.5–4.6)
Potassium: 4 mmol/L (ref 3.5–5.1)
Sodium: 130 mmol/L — ABNORMAL LOW (ref 135–145)

## 2021-12-02 LAB — MAGNESIUM: Magnesium: 2.2 mg/dL (ref 1.7–2.4)

## 2021-12-02 LAB — GLUCOSE, CAPILLARY
Glucose-Capillary: 285 mg/dL — ABNORMAL HIGH (ref 70–99)
Glucose-Capillary: 237 mg/dL — ABNORMAL HIGH (ref 70–99)
Glucose-Capillary: 358 mg/dL — ABNORMAL HIGH (ref 70–99)
Glucose-Capillary: 260 mg/dL — ABNORMAL HIGH (ref 70–99)

## 2021-12-02 LAB — CBC
HCT: 24.2 % — ABNORMAL LOW (ref 39.0–52.0)
Hemoglobin: 7.7 g/dL — ABNORMAL LOW (ref 13.0–17.0)
MCH: 29.1 pg (ref 26.0–34.0)
MCHC: 31.8 g/dL (ref 30.0–36.0)
MCV: 91.3 fL (ref 80.0–100.0)
Platelets: 338 10*3/uL (ref 150–400)
RBC: 2.65 MIL/uL — ABNORMAL LOW (ref 4.22–5.81)
RDW: 15.5 % (ref 11.5–15.5)
WBC: 12.3 10*3/uL — ABNORMAL HIGH (ref 4.0–10.5)
nRBC: 0 % (ref 0.0–0.2)

## 2021-12-02 LAB — AEROBIC/ANAEROBIC CULTURE W GRAM STAIN (SURGICAL/DEEP WOUND)
Culture: NO GROWTH
Gram Stain: NONE SEEN

## 2021-12-02 MED ORDER — INSULIN DETEMIR 100 UNIT/ML ~~LOC~~ SOLN
28.0000 [IU] | Freq: Two times a day (BID) | SUBCUTANEOUS | Status: DC
  Administered 2021-12-02: 28 [IU] via SUBCUTANEOUS
  Filled 2021-12-02 (×3): qty 0.28

## 2021-12-02 MED ORDER — HEPARIN SODIUM (PORCINE) 1000 UNIT/ML DIALYSIS
20.0000 [IU]/kg | INTRAMUSCULAR | Status: DC | PRN
  Administered 2021-12-02: 2500 [IU] via INTRAVENOUS_CENTRAL
  Filled 2021-12-02: qty 3

## 2021-12-02 MED ORDER — MIDODRINE HCL 5 MG PO TABS
5.0000 mg | ORAL_TABLET | Freq: Three times a day (TID) | ORAL | Status: DC
  Administered 2021-12-03 – 2021-12-04 (×5): 5 mg via ORAL
  Filled 2021-12-02 (×6): qty 1

## 2021-12-02 MED ORDER — MIDODRINE HCL 5 MG PO TABS: 5.0000 mg | ORAL_TABLET | Freq: Three times a day (TID) | ORAL | Status: DC

## 2021-12-02 MED ORDER — DARBEPOETIN ALFA 100 MCG/0.5ML IJ SOSY
PREFILLED_SYRINGE | INTRAMUSCULAR | Status: AC
  Administered 2021-12-02: 100 ug via INTRAVENOUS_CENTRAL
  Filled 2021-12-02: qty 0.5

## 2021-12-02 MED ORDER — INSULIN DETEMIR 100 UNIT/ML ~~LOC~~ SOLN
30.0000 [IU] | Freq: Two times a day (BID) | SUBCUTANEOUS | Status: DC
  Administered 2021-12-02: 30 [IU] via SUBCUTANEOUS
  Filled 2021-12-02 (×3): qty 0.3

## 2021-12-02 MED ORDER — INSULIN STARTER KIT- PEN NEEDLES (ENGLISH)
1.0000 | Freq: Once | Status: AC
  Administered 2021-12-02: 1
  Filled 2021-12-02: qty 1

## 2021-12-02 NOTE — Progress Notes (Addendum)
Patient ID: [name] [name], male   DOB: 09/02/1955, 66 y.o.   MRN: [id number]     Advanced Heart Failure Rounding Note  PCP-Cardiologist: None   Subjective:    Abx started 5/25 for suspected septic shock.  Initial abx completed and afebrile. WBC trending down to 17. CCM added rocephin 06/01 for possible aspiration. On 6/2, abx transitioned to vanc/cefepime and micafungin added (yeast in resp culture). On 6/6, abx transitioned to Unasyn and micafungin/vancomycin stopped.   6/7 s/p aspiration of RP collection, this was likely hematoma and not abscess.   S/p TEE/DCCV to SR 06/01. 6/5 S/P DC-CV--->back in A fib. DCCV again 6/9, remains in NSR today. Now on po amio.   Left thoracentesis 6/2 with 900 cc off.   Echo: Difficult study. LV EF 55-60%, RV probably normal, no pericardial effusion.   Getting MWF HD. No documented urine output.   Hgb 7.7 today.   No complaints.     Objective:   Weight Range: 123.9 kg Body mass index is 39.19 kg/m.   Vital Signs:   Temp:  [97.6 F (36.4 C)-98.8 F (37.1 C)] 98.7 F (37.1 C) (06/12 0756) Pulse Rate:  [66-73] 73 (06/12 0756) Resp:  [16-20] 18 (06/12 0756) BP: (107-146)/(43-58) 140/58 (06/12 0756) SpO2:  [94 %-99 %] 96 % (06/12 0756) Weight:  [123.9 kg] 123.9 kg (06/12 0501) Last BM Date : 12/01/21  Weight change: Filed Weights   11/30/21 0500 12/01/21 0500 12/02/21 0501  Weight: 122.4 kg 127.3 kg 123.9 kg    Intake/Output:  No intake or output data in the 24 hours ending 12/02/21 0804    Physical Exam  General: In bed.  No resp difficulty HEENT: normal Neck: supple. no JVD. Carotids 2+ bilat; no bruits. No lymphadenopathy or thryomegaly appreciated. Cor: PMI nondisplaced. Regular rate & rhythm. No rubs, gallops or murmurs. Sternal incision approximated.  Lungs: clear Abdomen: soft, nontender, nondistended. No hepatosplenomegaly. No bruits or masses. Good bowel sounds. Extremities: no cyanosis, clubbing, rash, R and LLE trace-1+  edema Neuro: alert & orientedx3, cranial nerves grossly intact. moves all 4 extremities w/o difficulty. Affect pleasant  Telemetry   Sr 60-70s personally checked.   Labs    CBC Recent Labs    12/01/21 0111 12/02/21 0138  WBC 13.7* 12.3*  HGB 7.8* 7.7*  HCT 23.7* 24.2*  MCV 90.8 91.3  PLT 319 923   Basic Metabolic Panel Recent Labs    12/01/21 0111 12/02/21 0138  NA 130* 130*  K 3.6 4.0  CL 92* 94*  CO2 22 20*  GLUCOSE 224* 245*  BUN 61* 76*  CREATININE 7.10* 8.10*  CALCIUM 8.3* 8.4*  MG 2.0 2.2  PHOS 6.7* 7.2*   Liver Function Tests Recent Labs    12/01/21 0111 12/02/21 0138  ALBUMIN 2.2* 2.3*   No results for input(s): "LIPASE", "AMYLASE" in the last 72 hours. Cardiac Enzymes No results for input(s): "CKTOTAL", "CKMB", "CKMBINDEX", "TROPONINI" in the last 72 hours.  BNP: BNP (last 3 results) No results for input(s): "BNP" in the last 8760 hours.  ProBNP (last 3 results) No results for input(s): "PROBNP" in the last 8760 hours.   D-Dimer No results for input(s): "DDIMER" in the last 72 hours. Hemoglobin A1C No results for input(s): "HGBA1C" in the last 72 hours. Fasting Lipid Panel No results for input(s): "CHOL", "HDL", "LDLCALC", "TRIG", "CHOLHDL", "LDLDIRECT" in the last 72 hours. Thyroid Function Tests No results for input(s): "TSH", "T4TOTAL", "T3FREE", "THYROIDAB" in the last 72 hours.  Invalid input(s): "  FREET3"  Other results:   Imaging    No results found.   Medications:     Scheduled Medications:  amiodarone  400 mg Oral BID   apixaban  5 mg Oral BID   aspirin EC  81 mg Oral Daily   atorvastatin  80 mg Oral Daily   Chlorhexidine Gluconate Cloth  6 each Topical Daily   darbepoetin (ARANESP) injection - DIALYSIS  100 mcg Intravenous Q Mon-HD   docusate sodium  200 mg Oral Daily   guaiFENesin  1,200 mg Oral BID   insulin aspart  0-24 Units Subcutaneous TID WC   insulin detemir  28 Units Subcutaneous BID   lidocaine  1  patch Transdermal Q24H   metoCLOPramide (REGLAN) injection  5 mg Intravenous Q8H   midodrine  10 mg Oral Q8H   multivitamin  1 tablet Oral QHS   pantoprazole  40 mg Oral Daily   sodium chloride flush  3 mL Intravenous Q12H    Infusions:  sodium chloride     anticoagulant sodium citrate      PRN Medications: sodium chloride, acetaminophen, alteplase, anticoagulant sodium citrate, bisacodyl, heparin, heparin, ipratropium-albuterol, lactulose, lidocaine (PF), lidocaine-prilocaine, magic mouthwash w/lidocaine, ondansetron (ZOFRAN) IV, oxyCODONE, pentafluoroprop-tetrafluoroeth, sodium chloride flush, traMADol    Patient Profile   66 y.o. male with history of CM with recovered EF, persistent atrial fibrillation, DM II, CKD, sick sinus syndrome, OSA, prior smoker. Underwent CABG X 3 and MAZE/PVI on 05/23>>post-op decrease in UOP and AKI with volume overload>>postcardiotomy shock requiring inotrope and pressor support  Assessment/Plan   1. Shock: Echo reviewed, LV EF 55-60%, RV poorly visualized but probably near-normal, no significant pericardial effusion. Suspect sepsis physiology with distributive shock.  Improved with broad spectrum abx. Lactate has cleared. Bcx 5/25 remain NGTD. Now off pressors/resolved.   - SBP running higher. Start to wean midodrine 5 mg tid.  2. Acute on chronic primarily diastolic CHF: LV EF 22-02%, RV poorly visualized but probably near-normal, no significant pericardial effusion.  Patient is still volume overloaded, suspect primarily right heart failure physiology.  CVVHD started on 5/26. Now getting iHD and tolerating.  - Plan for HD today.  3. AKI on CKD stage 3: Baseline SCr 1.3-1.5 .  Suspect due to shock/ATN as well as elevated renal venous pressure in setting of volume overload, ileus, abdominal distention. CVVHD started 5/26. Remains nearly anuric.  Lost HD line 5/31, replaced 06/01. Now getting iHD. No documented urine output.  - MWF HD.   4. CAD: S/p CABG.  No chest pain.  - Continue ASA, statin.  5. Atrial fibrillation with RVR: Has had Maze and LA appendage clip at time of CABG on 11/12/21. S/p TEE/DCCV to SR on 06//01. Back in AF by 6/2.  DCCV 6/5 and again on 6/9 to NSR.  Remains in SR.   - Eliquis 5 mg bid.  - Continue amiodarone 400 mg po bid  6. DM2: SSI.  7. ID: Persistent leukocytosis.  Septic shock initially, resolved.  Blood cultures NGTD.  Treated for HCAP, completed vanc and cefepime.  With WBCs rising again and concern for aspiration, he was started back on vanc/cefepime and micafungin given yeast in sputum. CT chest/abd/pelvis with left retroperitoneal fluid collection (?hemorrhage vs abscess), bilateral lower lobe infiltrates vs atelectasis, moderate loculated left pleural effusion.  He was transitioned 6/6 to Unasyn to cover aspiration PNA. IR aspiration of RP fluid collection => likely hematoma and not abscess. Afebrile, WBCs 17 => 15=>12.3  -Completed IV antibiotics.  8. Ileus: Resolved.  9. Pulmonary: Respiratory status improving with fluid removal and abx. Thoracentesis on 6/2.  - HD for volume removal  - CCM following.   10. RP fluid collection: 6/7 S/P aspiration by IR. Likely hematoma.  11. Anemia: RP hematoma.  Hgb 7.7 today.  - Transfuse < 7.5.   As above start to wean midodrine 5 mg tid.  Length of Stay: [place], NP  12/02/2021, 8:04 AM  Advanced Heart Failure Team Pager [phone] (M-F; 7a - 5p)  Please contact [facility] for night-coverage after hours (5p -7a ) and weekends on [email or web address]  Patient seen and examined with the above-signed Advanced Practice Provider and/or Housestaff. I personally reviewed laboratory data, imaging studies and relevant notes. I independently examined the patient and formulated the important aspects of the plan. I have edited the note to reflect any of my changes or salient points. I have personally discussed the plan with the patient and/or family.  Remains HD dependent. In NSR  on po amio. BP elevated. No CP or SOB   General: Sitting in chair . No resp difficulty HEENT: normal Neck: supple. JVP up  Carotids 2+ bilat; no bruits. No lymphadenopathy or thryomegaly appreciated. Cor: PMI nondisplaced. Regular rate & rhythm. No rubs, gallops or murmurs. Lungs: clear Abdomen: obese soft, nontender, nondistended. No hepatosplenomegaly. No bruits or masses. Good bowel sounds. Extremities: no cyanosis, clubbing, rash, 1+ edema Neuro: alert & orientedx3, cranial nerves grossly intact. moves all 4 extremities w/o difficulty. Affect pleasant  Stable from cardiac perspective. For iHD today. Continue amio and apixaban. Wean midodrine as tolerated. Ok for d/c from cardiac perspective once he has outpatient HD spot.   Glori Bickers, MD  8:27 AM

## 2021-12-02 NOTE — Progress Notes (Signed)
Patient returned from HD in afib. [place], [place] with CVTS notified.

## 2021-12-02 NOTE — Progress Notes (Signed)
Patient ID: [name] [name], male   DOB: 18-Nov-1955, 66 y.o.   MRN: [id number]  Assessment/Plan: Acute kidney injury on chronic kidney disease stage IIIa: -AKI likely ischemic ATN in the setting of septic versus cardiogenic shock.  Started on CRRT 5/26 for extracorporeal volume unloading after he had fixed urine output on Lasix drip that was inadequate for volume unloading. CRRT 5/26-5/31.   New temp HD catheter placed 11/21/21 and started IHD on 6/3 and tolerating it well.  Will continue with MWF schedule. Appears to be on for 1st shift today Appreciate RIJ TDC placement 11/28/21 by IR.  No evidence of renal recovery at this junction. Will need outpatient HD to be arranged once closer to discharge [facility]) Coronary artery disease status post three-vessel CABG: -on aspirin/statin. Left hip/back pain - CT scan with left psoas muscle hematoma vs abscess.  WBC improving.  Plan per primary svc Is moving better; more difficult to get back in bed. Acute on chronic dCHF -UF as tolerated with HD, volume status improving -milrinone stopped 5/29 Shock -possibly from sepsis+cardiogenic. Abx/pressor support per primary service. Sepsis w/u per CCM/  -off pressors and on midodrine now. Leukocytosis - completed Unasyn to cover possible aspiration PNA. WBC improving trend. Atrial fibrillation: -Status post MAZE procedure at the time of CABG. Per primary service, on amio. S/p tee-dccv 6/1.  S/p a successful cardioversion 11/25/21.  Anemia: -Likely associated with recent surgery and postoperative inflammatory response.  transfuse PRN for hgb <7-iron panel=iron deficient and he has completed abx course; and there is no clearcut source of infection + improving trend in WBC. ESA started 6/5 Hyponatremia -Managing with HD and UF, improved Left Pleural Effusion -s/p thora 6/2, 900cc removed  Disposition - unclear if he will require SNF or CIR.  Will need outpatient HD arrangements to be made. [place] but waiting for actual  seat; will d/w renal navigator today.   S: Feels well, no new complaints.  O:BP (!) 121/43 (BP Location: Left Wrist)   Pulse 66   Temp 98.5 F (36.9 C) (Oral)   Resp 16   Ht 5\' 10"  (1.778 m)   Wt 127.3 kg   SpO2 94%   BMI 40.27 kg/m  No intake or output data in the 24 hours ending 12/02/21 0746  Intake/Output: No intake/output data recorded.  Intake/Output this shift:  No intake/output data recorded. Weight change:  Gen: NAD CVS: RRR Resp:CTA Abd: +BS, soft, NT/ND ZYS:AYTKZ-6+ pretibial edema bilaterally  Recent Labs  Lab 11/26/21 0436 11/27/21 0145 11/28/21 0454 11/29/21 0550 11/30/21 0136 12/01/21 0111 12/02/21 0138  NA 130* 131* 130* 130* 134* 130* 130*  K 4.1 4.3 3.9 4.0 3.6 3.6 4.0  CL 91* 93* 93* 93* 95* 92* 94*  CO2 23 20* 25 22 23 22  20*  GLUCOSE 241* 243* 235* 187* 162* 224* 245*  BUN 35* 52* 39* 63* 43* 61* 76*  CREATININE 4.92* 6.66* 5.35* 7.44* 5.43* 7.10* 8.10*  ALBUMIN 2.5* 2.6* 2.2* 2.3* 2.2* 2.2* 2.3*  CALCIUM 8.2* 8.4* 8.0* 8.1* 8.3* 8.3* 8.4*  PHOS 5.2* 7.8* 5.8* 6.9* 5.1* 6.7* 7.2*   Liver Function Tests: Recent Labs  Lab 11/30/21 0136 12/01/21 0111 12/02/21 0138  ALBUMIN 2.2* 2.2* 2.3*   No results for input(s): "LIPASE", "AMYLASE" in the last 168 hours. No results for input(s): "AMMONIA" in the last 168 hours. CBC: Recent Labs  Lab 11/26/21 0436 11/26/21 1513 11/28/21 0454 11/29/21 0550 11/30/21 0136 12/01/21 0111 12/02/21 0138  WBC 25.2*  25.4*   < >  17.5* 17.8* 15.2* 13.7* 12.3*  NEUTROABS 20.1*  --   --   --   --   --   --   HGB 7.6*  7.6*   < > 8.1* 8.4* 7.7* 7.8* 7.7*  HCT 24.0*  23.8*   < > 25.4* 25.9* 24.7* 23.7* 24.2*  MCV 94.9  93.0   < > 92.7 92.5 92.9 90.8 91.3  PLT 293  287   < > 316 352 342 319 338   < > = values in this interval not displayed.   Cardiac Enzymes: No results for input(s): "CKTOTAL", "CKMB", "CKMBINDEX", "TROPONINI" in the last 168 hours. CBG: Recent Labs  Lab 12/01/21 0034  12/01/21 0708 12/01/21 1132 12/01/21 1629 12/02/21 0636  GLUCAP 218* 166* 222* 285* 237*    Iron Studies: No results for input(s): "IRON", "TIBC", "TRANSFERRIN", "FERRITIN" in the last 72 hours. Studies/Results: DG Chest Port 1 View  Result Date: 12/01/2021 CLINICAL DATA:  Status post CABG. EXAM: PORTABLE CHEST 1 VIEW COMPARISON:  11/29/2021 FINDINGS: The cardio pericardial silhouette is enlarged. Similar appearance patchy airspace disease at the left base. Right IJ central line tip overlies the right atrium. Left IJ central line is been removed in the interval. Telemetry leads overlie the chest. IMPRESSION: 1. Interval removal of left IJ central line. 2. Similar appearance of patchy airspace disease at the left base. Electronically Signed   By: Misty Stanley M.D.   On: 12/01/2021 09:53     amiodarone  400 mg Oral BID   apixaban  5 mg Oral BID   aspirin EC  81 mg Oral Daily   atorvastatin  80 mg Oral Daily   Chlorhexidine Gluconate Cloth  6 each Topical Daily   darbepoetin (ARANESP) injection - DIALYSIS  100 mcg Intravenous Q Mon-HD   docusate sodium  200 mg Oral Daily   guaiFENesin  1,200 mg Oral BID   insulin aspart  0-24 Units Subcutaneous TID WC   insulin detemir  25 Units Subcutaneous BID   lidocaine  1 patch Transdermal Q24H   metoCLOPramide (REGLAN) injection  5 mg Intravenous Q8H   midodrine  10 mg Oral Q8H   multivitamin  1 tablet Oral QHS   pantoprazole  40 mg Oral Daily   sodium chloride flush  3 mL Intravenous Q12H    BMET    Component Value Date/Time   NA 130 (L) 12/02/2021 0138   NA 139 10/21/2021 1547   K 4.0 12/02/2021 0138   CL 94 (L) 12/02/2021 0138   CO2 20 (L) 12/02/2021 0138   GLUCOSE 245 (H) 12/02/2021 0138   BUN 76 (H) 12/02/2021 0138   BUN 29 (H) 10/21/2021 1547   CREATININE 8.10 (H) 12/02/2021 0138   CALCIUM 8.4 (L) 12/02/2021 0138   GFRNONAA 7 (L) 12/02/2021 0138   GFRAA 42 (L) 10/18/2019 1006   CBC    Component Value Date/Time   WBC 12.3  (H) 12/02/2021 0138   RBC 2.65 (L) 12/02/2021 0138   HGB 7.7 (L) 12/02/2021 0138   HGB 13.4 10/21/2021 1547   HCT 24.2 (L) 12/02/2021 0138   HCT 40.8 10/21/2021 1547   PLT 338 12/02/2021 0138   PLT 246 10/21/2021 1547   MCV 91.3 12/02/2021 0138   MCV 91 10/21/2021 1547   MCH 29.1 12/02/2021 0138   MCHC 31.8 12/02/2021 0138   RDW 15.5 12/02/2021 0138   RDW 14.6 10/21/2021 1547   LYMPHSABS 1.4 11/26/2021 0436   MONOABS 2.8 (H) 11/26/2021 3846  EOSABS 0.3 11/26/2021 0436   BASOSABS 0.1 11/26/2021 0436

## 2021-12-02 NOTE — Progress Notes (Addendum)
      [address]       [place] [address]             [phone]        3 Days Post-Op Procedure(s) (LRB): CARDIOVERSION (N/A)  Subjective: Patient in bed, watching tv. He really wants to go home.  Objective: Vital signs in last 24 hours: Temp:  [97.6 F (36.4 C)-98.8 F (37.1 C)] 98.5 F (36.9 C) (06/12 0501) Pulse Rate:  [66-69] 66 (06/12 0501) Cardiac Rhythm: Normal sinus rhythm (06/11 2000) Resp:  [16-20] 16 (06/12 0501) BP: (104-146)/(43-64) 121/43 (06/12 0501) SpO2:  [94 %-99 %] 94 % (06/12 0501)   Current Weight  12/01/21 127.3 kg       Intake/Output from previous day: No intake/output data recorded.   Physical Exam:  Cardiovascular: RRR Pulmonary: Clear to auscultation bilaterally Abdomen: Soft, non tender, bowel sounds present. Extremities: Mild bilateral lower extremity edema. Wounds: Clean and dry.  No erythema or signs of infection.  Lab Results: CBC: Recent Labs    12/01/21 0111 12/02/21 0138  WBC 13.7* 12.3*  HGB 7.8* 7.7*  HCT 23.7* 24.2*  PLT 319 338   BMET:  Recent Labs    12/01/21 0111 12/02/21 0138  NA 130* 130*  K 3.6 4.0  CL 92* 94*  CO2 22 20*  GLUCOSE 224* 245*  BUN 61* 76*  CREATININE 7.10* 8.10*  CALCIUM 8.3* 8.4*    PT/INR:  Lab Results  Component Value Date   INR 1.3 (H) 11/28/2021   INR 1.3 (H) 11/27/2021   INR 1.4 (H) 11/25/2021   ABG:  INR: Will add last result for INR, ABG once components are confirmed Will add last 4 CBG results once components are confirmed  Assessment/Plan: CV-PAF. SR, first degree heart block. Has had DCCV x 3. On Amiodarone 400 mg bid, Apixaban 5 mg bid, and Midodrine 10 mg tid. 2. Pulmonary-History of OSA (on CPAP). On room air. Encourage incentive spirometer. 3. Acute on chronic CHF-Heart failure had been following 4. Expected post op blood loss anemia-H and H this am stable at 7.7 and 24.2. On Aranesp 5. DM-CBGs 222/285/237. Pre op HGA1C 9.3. On Insulin  but will increase for better control. Will not restart Metformin or Glipizide yet as creatinine elevated. Will need close medical follow up after discharge 6. AKI on CKD (stage III)-Creatinine increased to 8.10 this am. Nephrology following and arranging for HD 7. Physical deconditioning-Continue PT. Home PT ordered 8. Leukocytosis-On Unasyn. WBC this am decreased to 12,300. Blood culture show no growth to date. S/p CT guided aspiration by IR for retroperitoneal hematoma (hematoma). 55 cc removed  9. Once more medically optimized, will discharge  Donielle M ZimmermanPA-C 7:32 AM   Patient stable and ambulatory Off antibiotics, WBC 12k Will DC tomorrow on non HD day, face to face [facility] completed  patient examined and medical record reviewed,agree with above note. Dahlia Byes 12/02/2021

## 2021-12-02 NOTE — Progress Notes (Addendum)
Contacted [facility] admissions this am. Pt's clinic still needs to be determined. Pt's out-pt arrangements have not been finalized at this time. Will f/u with admissions later this afternoon.   Melven Sartorius Renal Navigator [phone]  Addendum at 3:17 pm: Contacted pt via phone and spoke to pt's significant other, Thayer Headings. Advised pt and pt's significant other that [facility] is full and to inquire about pt's 2nd preference. Pt prefers [facility] if possible. Contacted [facility] admissions to request that availability at [place] be investigated. Pt's significant other plans to transport pt to out-pt HD appts at d/c. Will follow and assist.

## 2021-12-02 NOTE — Progress Notes (Signed)
Pt  not going to wear BIPAP at this time due to not being comfortable when he has it on per RN.  RN states patient is wearing a nasal cannula and showing no sign of distress at this time.  RT will be called if needed.

## 2021-12-02 NOTE — Inpatient Diabetes Management (Addendum)
Inpatient Diabetes Program Recommendations  AACE/ADA: New Consensus Statement on Inpatient Glycemic Control (2015)  Target Ranges:  Prepandial:   less than 140 mg/dL      Peak postprandial:   less than 180 mg/dL (1-2 hours)      Critically ill patients:  140 - 180 mg/dL   Lab Results  Component Value Date   GLUCAP 237 (H) 12/02/2021   HGBA1C 9.3 (H) 11/08/2021    Review of Glycemic Control  Latest Reference Range & Units 12/01/21 07:08 12/01/21 11:32 12/01/21 16:29 12/02/21 06:36  Glucose-Capillary 70 - 99 mg/dL 166 (H) 222 (H) 285 (H) 237 (H)  (H): Data is abnormally high  Diabetes history: DM2   Outpatient Diabetes medications: Trulicity 3 mg weekly, Farxiga 10 mg QD, Glipizide 10 mg BID, Metformin 1000 mg BID   Current orders for Inpatient glycemic control: Levimir 28 units BID, Novolog 0-24 units TID   Inpatient Diabetes Program Recommendations:   Noted postprandial CBGs elevated. Please consider: -Novolog 3 units TID with meals if eats at least 50% meals  Noted patient to discharge home on insulin due to not able to restart oral DM medications. Patient currently in HD but will followup with patient post HD or in am and order starter kit. Patient currently taking Trulicity injections. Nurses, please allow patient to give own insulin injections while in the hospital.  Thank you, Nani Gasser. Ellina Sivertsen, RN, MSN, CDE  Diabetes Coordinator Inpatient Glycemic Control Team Team Pager [phone] (8am-5pm) 12/02/2021 10:36 AM

## 2021-12-02 NOTE — Progress Notes (Signed)
PT Cancellation Note  Patient Details Name: [name] [name] MRN: [id number] DOB: 10/08/1955   Cancelled Treatment:    Reason Eval/Treat Not Completed: Patient declined, no reason specified. Pt now back from HD and continuing to decline to mobilize with PT, stating his L hip is hurting, friends are coming to visit soon, and he will walk to the bathroom later. PT tried to encourage pt to mobilize to improve his endurance, balance, and independence/safety with mobility, educating pt that continued immobility could result in deconditioning and further stiffness. PT asked if there was a reason he is declining to work with PT and pt stated "no, you all have been great" and "I just don't feel like it". Pt verbalized understanding of the risks of not mobilizing and that he reports he will do his bed level exercises and walk laps in the room later with nursing staff. This PT is leaving for the day soon and is unable to check on him again later, thus will plan to follow-up another day as able.   [name], PT, DPT Acute Rehabilitation Services  Office: [place] 12/02/2021, 2:12 PM

## 2021-12-02 NOTE — Care Management Important Message (Signed)
Important Message  Patient Details  Name: [name] [name] MRN: [id number] Date of Birth: October 11, 1955   Medicare Important Message Given:  Yes     [name] [address] 12/02/2021, 3:56 PM

## 2021-12-02 NOTE — Progress Notes (Signed)
PT Cancellation Note  Patient Details Name: [name] [name] MRN: [id number] DOB: 1955/07/15   Cancelled Treatment:    Reason Eval/Treat Not Completed: Patient declined, no reason specified. Pt politely declining PT at this time, requesting to wait until later or until after he sees the MD. Encouraged pt to participate prior to HD, but pt still declining at this time, stating he has been ambulating to/from the bathroom with nursing this morning which nursing did confirm. Will plan to follow-up later as time permits.   [name], PT, DPT Acute Rehabilitation Services  Office: [place] 12/02/2021, 7:32 AM

## 2021-12-02 NOTE — TOC CM/SW Note (Signed)
HF TOC CM spoke to pt and gave permission to speak to wife, Thayer Headings. Contacted ex-wife and discussed HH. Offered choice and agreeable to agency that will accept his plan. (Medicare.gov list with ratings on chart). Contacted [facility] rep, with new referral. Pt will need Rolling Walker with Seat and 3n1 bsc. Pt may also need oxygen. Will have Unit RN check walking oxygen sat closer to dc dated for possible oxygen at home. Will need HH RN and PT orders with F2F. Pt has declined SNF rehab. But maybe agreeable to IP rehab if PT recommends. [place], Heart Failure TOC CM [phone]

## 2021-12-03 ENCOUNTER — Other Ambulatory Visit (HOSPITAL_COMMUNITY): Payer: Self-pay

## 2021-12-03 DIAGNOSIS — D631 Anemia in chronic kidney disease: Secondary | ICD-10-CM | POA: Insufficient documentation

## 2021-12-03 DIAGNOSIS — I429 Cardiomyopathy, unspecified: Secondary | ICD-10-CM | POA: Insufficient documentation

## 2021-12-03 DIAGNOSIS — I130 Hypertensive heart and chronic kidney disease with heart failure and stage 1 through stage 4 chronic kidney disease, or unspecified chronic kidney disease: Secondary | ICD-10-CM | POA: Insufficient documentation

## 2021-12-03 DIAGNOSIS — I2510 Atherosclerotic heart disease of native coronary artery without angina pectoris: Secondary | ICD-10-CM | POA: Insufficient documentation

## 2021-12-03 DIAGNOSIS — D509 Iron deficiency anemia, unspecified: Secondary | ICD-10-CM | POA: Insufficient documentation

## 2021-12-03 DIAGNOSIS — Z87891 Personal history of nicotine dependence: Secondary | ICD-10-CM | POA: Insufficient documentation

## 2021-12-03 DIAGNOSIS — I5042 Chronic combined systolic (congestive) and diastolic (congestive) heart failure: Secondary | ICD-10-CM | POA: Insufficient documentation

## 2021-12-03 DIAGNOSIS — N1831 Chronic kidney disease, stage 3a: Secondary | ICD-10-CM | POA: Insufficient documentation

## 2021-12-03 DIAGNOSIS — Z951 Presence of aortocoronary bypass graft: Secondary | ICD-10-CM | POA: Diagnosis not present

## 2021-12-03 DIAGNOSIS — E1140 Type 2 diabetes mellitus with diabetic neuropathy, unspecified: Secondary | ICD-10-CM | POA: Insufficient documentation

## 2021-12-03 DIAGNOSIS — Z992 Dependence on renal dialysis: Secondary | ICD-10-CM | POA: Insufficient documentation

## 2021-12-03 DIAGNOSIS — Z7985 Long-term (current) use of injectable non-insulin antidiabetic drugs: Secondary | ICD-10-CM | POA: Insufficient documentation

## 2021-12-03 DIAGNOSIS — Z7901 Long term (current) use of anticoagulants: Secondary | ICD-10-CM | POA: Insufficient documentation

## 2021-12-03 DIAGNOSIS — Z7984 Long term (current) use of oral hypoglycemic drugs: Secondary | ICD-10-CM | POA: Insufficient documentation

## 2021-12-03 HISTORY — DX: Cardiomyopathy, unspecified: I42.9

## 2021-12-03 LAB — CBC
HCT: 24.1 % — ABNORMAL LOW (ref 39.0–52.0)
Hemoglobin: 7.7 g/dL — ABNORMAL LOW (ref 13.0–17.0)
MCH: 29.1 pg (ref 26.0–34.0)
MCHC: 32 g/dL (ref 30.0–36.0)
MCV: 90.9 fL (ref 80.0–100.0)
Platelets: 320 10*3/uL (ref 150–400)
RBC: 2.65 MIL/uL — ABNORMAL LOW (ref 4.22–5.81)
RDW: 15.2 % (ref 11.5–15.5)
WBC: 11.3 10*3/uL — ABNORMAL HIGH (ref 4.0–10.5)
nRBC: 0 % (ref 0.0–0.2)

## 2021-12-03 LAB — MISC LABCORP TEST (SEND OUT): Labcorp test code: 9985

## 2021-12-03 LAB — RENAL FUNCTION PANEL
Albumin: 2.3 g/dL — ABNORMAL LOW (ref 3.5–5.0)
Anion gap: 12 (ref 5–15)
BUN: 52 mg/dL — ABNORMAL HIGH (ref 8–23)
CO2: 25 mmol/L (ref 22–32)
Calcium: 8.5 mg/dL — ABNORMAL LOW (ref 8.9–10.3)
Chloride: 94 mmol/L — ABNORMAL LOW (ref 98–111)
Creatinine, Ser: 5.87 mg/dL — ABNORMAL HIGH (ref 0.61–1.24)
GFR, Estimated: 10 mL/min — ABNORMAL LOW (ref >60)
Glucose, Bld: 205 mg/dL — ABNORMAL HIGH (ref 70–99)
Phosphorus: 5.2 mg/dL — ABNORMAL HIGH (ref 2.5–4.6)
Potassium: 3.9 mmol/L (ref 3.5–5.1)
Sodium: 131 mmol/L — ABNORMAL LOW (ref 135–145)

## 2021-12-03 LAB — MAGNESIUM: Magnesium: 1.9 mg/dL (ref 1.7–2.4)

## 2021-12-03 LAB — GLUCOSE, CAPILLARY
Glucose-Capillary: 192 mg/dL — ABNORMAL HIGH (ref 70–99)
Glucose-Capillary: 287 mg/dL — ABNORMAL HIGH (ref 70–99)
Glucose-Capillary: 259 mg/dL — ABNORMAL HIGH (ref 70–99)
Glucose-Capillary: 205 mg/dL — ABNORMAL HIGH (ref 70–99)

## 2021-12-03 MED ORDER — AMIODARONE HCL 200 MG PO TABS
200.0000 mg | ORAL_TABLET | Freq: Two times a day (BID) | ORAL | 1 refills | Status: DC
  Filled 2021-12-03: qty 40, 30d supply, fill #0

## 2021-12-03 MED ORDER — INSULIN DETEMIR 100 UNIT/ML FLEXPEN
30.0000 [IU] | PEN_INJECTOR | Freq: Two times a day (BID) | SUBCUTANEOUS | 11 refills | Status: DC
  Filled 2021-12-03: qty 15, 25d supply, fill #0

## 2021-12-03 MED ORDER — APIXABAN 5 MG PO TABS
5.0000 mg | ORAL_TABLET | Freq: Two times a day (BID) | ORAL | 1 refills | Status: DC
  Filled 2021-12-03: qty 60, 30d supply, fill #0

## 2021-12-03 MED ORDER — ACETAMINOPHEN 325 MG PO TABS: 650.0000 mg | ORAL_TABLET | Freq: Four times a day (QID) | ORAL | Status: DC | PRN

## 2021-12-03 MED ORDER — MIDODRINE HCL 5 MG PO TABS
5.0000 mg | ORAL_TABLET | Freq: Three times a day (TID) | ORAL | 1 refills | Status: DC
  Filled 2021-12-03: qty 90, 30d supply, fill #0

## 2021-12-03 MED ORDER — INSULIN DETEMIR 100 UNIT/ML ~~LOC~~ SOLN
34.0000 [IU] | Freq: Two times a day (BID) | SUBCUTANEOUS | Status: DC
  Administered 2021-12-03 – 2021-12-04 (×3): 34 [IU] via SUBCUTANEOUS
  Filled 2021-12-03 (×4): qty 0.34

## 2021-12-03 MED ORDER — INSULIN PEN NEEDLE 32G X 4 MM MISC
30.0000 [IU] | Freq: Two times a day (BID) | 1 refills | Status: DC
  Filled 2021-12-03: qty 100, 50d supply, fill #0

## 2021-12-03 MED ORDER — ASPIRIN 81 MG PO TBEC: 81.0000 mg | DELAYED_RELEASE_TABLET | Freq: Every day | ORAL | 12 refills | Status: DC

## 2021-12-03 MED ORDER — TEMAZEPAM 7.5 MG PO CAPS
30.0000 mg | ORAL_CAPSULE | Freq: Once | ORAL | Status: AC
  Administered 2021-12-03: 30 mg via ORAL
  Filled 2021-12-03: qty 4

## 2021-12-03 MED ORDER — TRAMADOL HCL 50 MG PO TABS
50.0000 mg | ORAL_TABLET | Freq: Two times a day (BID) | ORAL | 0 refills | Status: DC | PRN
  Filled 2021-12-03: qty 24, 12d supply, fill #0

## 2021-12-03 MED ORDER — GUAIFENESIN ER 600 MG PO TB12: 1200.0000 mg | ORAL_TABLET | Freq: Two times a day (BID) | ORAL | Status: DC | PRN

## 2021-12-03 MED ORDER — ATORVASTATIN CALCIUM 80 MG PO TABS
80.0000 mg | ORAL_TABLET | Freq: Every day | ORAL | 1 refills | Status: DC
  Filled 2021-12-03: qty 30, 30d supply, fill #0

## 2021-12-03 NOTE — TOC CM/SW Note (Signed)
HF TOC CM contacted [facility] for DME, Rolling walker with seat and 3n1 to be delivered to room. Notified [facility] rep, Tommi Rumps that pt may dc in next 1-2 days. [place], Heart Failure TOC CM [phone]

## 2021-12-03 NOTE — Progress Notes (Signed)
CARDIAC REHAB PHASE I   Offered to walk with pt, pt declines, states he only wants to walk out the door. D/c education completed with pt. Pt educated on site care and restrictions. Encouraged continued IS use, sternal precautions, and ambulation with emphasis on safety. Pt given in-the-tube sheet along with heart healthy and diabetic diets. Reviewed restrictions. Will refer to [place] with knowledge pt needs to complete outpt PT first.  [phone] Rufina Falco, RN BSN 12/03/2021 11:18 AM

## 2021-12-03 NOTE — Progress Notes (Signed)
Physical Therapy Treatment Patient Details Name: [name] [name] MRN: [id number] DOB: 02-10-1956 Today's Date: 12/03/2021   History of Present Illness Pt is a 66 y.o. male admitted 11/12/21 for multivessel CABG with Maze procedure; extubated 5/24. Postop course complicated by oliguria and NSVT vs. Afib/RVR, shock, worsening respiratory status, suspected ileus. Pt also with worsening renal function; CRRT initiated 5/26. S/p cardioversion 6/1. S/p L thoracentesis 6/2. Persistent afib 6/4; s/p cardioversion 6/5. CT chest/abd/pelvis with enlargement L psoas muscle (hemorrhage vs abscess), bilateral lower lobe infiltrates vs atelectasis, L pleural effusionS/p L psoas aspiration 6/7. For tunneled HD cath placement 6/8.   PMH includes CAD, CHF, CKD, DM2, HTN, afib (chronic anticoagulation), OSA.    PT Comments    Pt remains reluctant to participate in PT, but after encouragement pt did agree to stand and take a few steps in the room to assess his standing/gait balance without an AD. While pt was able to step anterior, posterior, and lateral without UE support, LOB, or need for assistance, he would benefit from a bari-rollator for energy conservation. Provided handout and education on sternal precautions and upper and lower extremity exercises. Educated pt on importance of frequent activity, energy conservation, progression of activity, sternal precautions compliance, limiting sodium intake, proper fluid intake, and rinsing canned fruits/veggies or eating fresh/frozen fruits and veggies. Pt verbalized understanding. Pt requesting a bari-3in1, but PT educated pt that it would be more beneficial for him to ambulate to/from his bathroom at home to improve his endurance/activity tolerance. He verbalized understanding, but still requests one. Will continue to follow acutely. Current recommendations remain appropriate.    Recommendations for follow up therapy are one component of a multi-disciplinary discharge planning  process, led by the attending physician.  Recommendations may be updated based on patient status, additional functional criteria and insurance authorization.  Follow Up Recommendations  Home health PT     Assistance Recommended at Discharge Intermittent Supervision/Assistance  Patient can return home with the following A little help with bathing/dressing/bathroom;Assistance with cooking/housework;Assist for transportation;Help with stairs or ramp for entrance   Equipment Recommendations  Rollator (4 wheels);BSC/3in1 (bariatric-sized for both; pt requesting the bari 3in1, but educated pt that PT prefer he ambulate to bathroom in his home instead)    Recommendations for Other Services       Precautions / Restrictions Precautions Precautions: Fall;Sternal Precaution Booklet Issued: Yes (comment) Restrictions Weight Bearing Restrictions: Yes Other Position/Activity Restrictions: sternal precautions     Mobility  Bed Mobility               General bed mobility comments: Pt in chair upon arrival.    Transfers Overall transfer level: Needs assistance Equipment used: None Transfers: Sit to/from Stand Sit to Stand: Supervision           General transfer comment: Pt transferring sit <> stand from standard chair without LOB or need for assistance, supervision for safety and to remind pt to keep hands on lap/chest with transfers but pt noncompliant pushing up (gently though) off arm rests and reaching back for arm rests    Ambulation/Gait Ambulation/Gait assistance: Supervision Gait Distance (Feet): 12 Feet Assistive device: None Gait Pattern/deviations: Step-through pattern, Decreased stride length Gait velocity: reduced Gait velocity interpretation: <1.8 ft/sec, indicate of risk for recurrent falls   General Gait Details: Pt declining to participat in PT initially, but then became agreeable to a few steps in room to assess his balance without AD. Pt able to step  anterior <> posterior and laterally without LOB  or significant trunk sway, supervision for safety   Stairs             Wheelchair Mobility    Modified Rankin (Stroke Patients Only)       Balance Overall balance assessment: Mild deficits observed, not formally tested (able to step anterior, posterior, and lateral a short distance without AD, LOB, or assistance, but pt declining further mobility to assess further)                                          Cognition Arousal/Alertness: Awake/alert Behavior During Therapy: WFL for tasks assessed/performed, Flat affect Overall Cognitive Status: Impaired/Different from baseline Area of Impairment: Memory                     Memory: Decreased recall of precautions, Decreased short-term memory         General Comments: Pt seemed to forget he just talked to this PT about DME needs before PT left the room to print off sternal precautions exercises, repeating that someone just told him about the DME needs when PT returned. Poor compliance with precautions when transferring.        Exercises      General Comments General comments (skin integrity, edema, etc.): Provided handout and education on sternal precautions and upper and lower extremity exercises. Educated pt on importance of frequent activity, progression of activity, sternal precautions compliance, limiting sodium intake, proper fluid intake, and rinsing canned fruits/veggies or eating fresh/frozen fruits and veggies. Pt verbalized understanding.      Pertinent Vitals/Pain Pain Assessment Pain Assessment: Faces Faces Pain Scale: Hurts a little bit Pain Location: L hip, sternum Pain Descriptors / Indicators: Discomfort, Sore Pain Intervention(s): Limited activity within patient's tolerance, Monitored during session    Home Living                          Prior Function            PT Goals (current goals can now be found in the  care plan section) Acute Rehab PT Goals Patient Stated Goal: to go home today PT Goal Formulation: With patient Time For Goal Achievement: 12/12/21 Potential to Achieve Goals: Good Progress towards PT goals: Progressing toward goals    Frequency    Min 3X/week      PT Plan Equipment recommendations need to be updated    Co-evaluation              AM-PAC PT "6 Clicks" Mobility   Outcome Measure  Help needed turning from your back to your side while in a flat bed without using bedrails?: None Help needed moving from lying on your back to sitting on the side of a flat bed without using bedrails?: None Help needed moving to and from a bed to a chair (including a wheelchair)?: A Little Help needed standing up from a chair using your arms (e.g., wheelchair or bedside chair)?: A Little Help needed to walk in hospital room?: A Little Help needed climbing 3-5 steps with a railing? : A Little 6 Click Score: 20    End of Session   Activity Tolerance: Patient tolerated treatment well;Other (comment) (self-limiting) Patient left: in chair;with call bell/phone within reach Nurse Communication: Mobility status PT Visit Diagnosis: Other abnormalities of gait and mobility (R26.89);Muscle weakness (generalized) (M62.81);Unsteadiness on feet (R26.81);Difficulty  in walking, not elsewhere classified (R26.2)     Time: 6244-6950 PT Time Calculation (min) (ACUTE ONLY): 13 min  Charges:  $Therapeutic Activity: 8-22 mins                     [name], PT, DPT Acute Rehabilitation Services  Office: [phone]    Orvan Falconer 12/03/2021, 8:20 AM

## 2021-12-03 NOTE — Progress Notes (Addendum)
Patient ID: [name] [name], male   DOB: 11-06-1955, 66 y.o.   MRN: [id number]     Advanced Heart Failure Rounding Note  PCP-Cardiologist: None   Subjective:    Abx started 5/25 for suspected septic shock.  Initial abx completed and afebrile. WBC trending down to 17. CCM added rocephin 06/01 for possible aspiration. On 6/2, abx transitioned to vanc/cefepime and micafungin added (yeast in resp culture). On 6/6, abx transitioned to Unasyn and micafungin/vancomycin stopped.   6/7 s/p aspiration of RP collection, this was likely hematoma and not abscess.   S/p TEE/DCCV to SR 06/01. 6/5 S/P DC-CV--->back in A fib. DCCV again 6/9, remains in NSR today. Now on po amio.   Left thoracentesis 6/2 with 900 cc off.   Echo: Difficult study. LV EF 55-60%, RV probably normal, no pericardial effusion.   Getting MWF HD. No documented urine output.   Hgb stable 7.7 today.  No concerns. Ready to go home. No dyspnea or CP.   Back in AF, rates 90s - low 100s  Objective:   Weight Range: 120 kg Body mass index is 37.96 kg/m.   Vital Signs:   Temp:  [97.4 F (36.3 C)-98.9 F (37.2 C)] 97.4 F (36.3 C) (06/13 0802) Pulse Rate:  [71-96] 94 (06/13 0802) Resp:  [14-21] 19 (06/13 0802) BP: (94-165)/(52-85) 104/55 (06/13 0802) SpO2:  [87 %-100 %] 97 % (06/13 0802) Weight:  [120 kg-121.4 kg] 120 kg (06/13 0634) Last BM Date : 12/02/21  Weight change: Filed Weights   12/02/21 0850 12/02/21 1326 12/03/21 0634  Weight: 123.7 kg 121.4 kg 120 kg    Intake/Output:   Intake/Output Summary (Last 24 hours) at 12/03/2021 0900 Last data filed at 12/03/2021 0400 Gross per 24 hour  Intake 440 ml  Output 3000 ml  Net -2560 ml      Physical Exam  General:  Sitting up in chair. No distress. HEENT: normal Neck: supple. JVP ~ 8 cm. Carotids 2+ bilat; no bruits. No lymphadenopathy or thryomegaly appreciated. Cor: PMI nondisplaced. Regular rate & rhythm. No rubs, gallops or murmurs. Sternum stable. Lungs:  clear Abdomen: soft, nontender, nondistended.  Extremities: no cyanosis, clubbing, rash, 1+ edema Neuro: alert & orientedx3, cranial nerves grossly intact. moves all 4 extremities w/o difficulty. Affect pleasant   Telemetry   AF 90s-100s (personally reviewed)  Labs    CBC Recent Labs    12/02/21 0138 12/03/21 0409  WBC 12.3* 11.3*  HGB 7.7* 7.7*  HCT 24.2* 24.1*  MCV 91.3 90.9  PLT 338 409   Basic Metabolic Panel Recent Labs    12/02/21 0138 12/03/21 0409  NA 130* 131*  K 4.0 3.9  CL 94* 94*  CO2 20* 25  GLUCOSE 245* 205*  BUN 76* 52*  CREATININE 8.10* 5.87*  CALCIUM 8.4* 8.5*  MG 2.2 1.9  PHOS 7.2* 5.2*   Liver Function Tests Recent Labs    12/02/21 0138 12/03/21 0409  ALBUMIN 2.3* 2.3*   No results for input(s): "LIPASE", "AMYLASE" in the last 72 hours. Cardiac Enzymes No results for input(s): "CKTOTAL", "CKMB", "CKMBINDEX", "TROPONINI" in the last 72 hours.  BNP: BNP (last 3 results) No results for input(s): "BNP" in the last 8760 hours.  ProBNP (last 3 results) No results for input(s): "PROBNP" in the last 8760 hours.   D-Dimer No results for input(s): "DDIMER" in the last 72 hours. Hemoglobin A1C No results for input(s): "HGBA1C" in the last 72 hours. Fasting Lipid Panel No results for input(s): "CHOL", "HDL", "LDLCALC", "  TRIG", "CHOLHDL", "LDLDIRECT" in the last 72 hours. Thyroid Function Tests No results for input(s): "TSH", "T4TOTAL", "T3FREE", "THYROIDAB" in the last 72 hours.  Invalid input(s): "FREET3"  Other results:   Imaging    No results found.   Medications:     Scheduled Medications:  amiodarone  400 mg Oral BID   apixaban  5 mg Oral BID   aspirin EC  81 mg Oral Daily   atorvastatin  80 mg Oral Daily   Chlorhexidine Gluconate Cloth  6 each Topical Daily   darbepoetin (ARANESP) injection - DIALYSIS  100 mcg Intravenous Q Mon-HD   docusate sodium  200 mg Oral Daily   guaiFENesin  1,200 mg Oral BID   insulin  aspart  0-24 Units Subcutaneous TID WC   insulin detemir  34 Units Subcutaneous BID   lidocaine  1 patch Transdermal Q24H   midodrine  5 mg Oral TID WC   multivitamin  1 tablet Oral QHS   pantoprazole  40 mg Oral Daily   sodium chloride flush  3 mL Intravenous Q12H    Infusions:  sodium chloride     anticoagulant sodium citrate      PRN Medications: sodium chloride, acetaminophen, alteplase, anticoagulant sodium citrate, bisacodyl, heparin, ipratropium-albuterol, lactulose, lidocaine (PF), lidocaine-prilocaine, magic mouthwash w/lidocaine, ondansetron (ZOFRAN) IV, oxyCODONE, pentafluoroprop-tetrafluoroeth, sodium chloride flush, traMADol    Patient Profile   66 y.o. male with history of CM with recovered EF, persistent atrial fibrillation, DM II, CKD, sick sinus syndrome, OSA, prior smoker. Underwent CABG X 3 and MAZE/PVI on 05/23>>post-op decrease in UOP and AKI with volume overload>>postcardiotomy shock requiring inotrope and pressor support  Assessment/Plan   1. Shock: Echo reviewed, LV EF 55-60%, RV poorly visualized but probably near-normal, no significant pericardial effusion. Suspect sepsis physiology with distributive shock.  Improved with broad spectrum abx. Lactate has cleared. Bcx 5/25 remain NGTD. Now off pressors/resolved.   - SBP better. Have started midodrine wean, now on 5 TID. Continue current dose for now, SBP 100s-110 after HD yesterday. 2. Acute on chronic primarily diastolic CHF: LV EF 44-31%, RV poorly visualized but probably near-normal, no significant pericardial effusion.  Patient is still volume overloaded, suspect primarily right heart failure physiology.  CVVHD started on 5/26. Now getting iHD and tolerating.  - Last HD yesterday. Patient reports he has outpatient HD chair. - GDMT limited by CKD and hypotension. 3. AKI on CKD stage 3: Baseline SCr 1.3-1.5 .  Suspect due to shock/ATN as well as elevated renal venous pressure in setting of volume overload,  ileus, abdominal distention. CVVHD started 5/26. Remains nearly anuric.  Lost HD line 5/31, replaced 06/01. Now getting iHD. No documented urine output.  - MWF HD.   4. CAD: S/p CABG. No chest pain.  - Continue ASA, statin.  5. Atrial fibrillation with RVR: Has had Maze and LA appendage clip at time of CABG on 11/12/21. S/p TEE/DCCV to SR on 06//01. Back in AF by 6/2.  DCCV 6/5 and again on 6/9 to NSR.  Back in afib yesterday afternoon, rate reasonably controlled. - Eliquis 5 mg bid.  - Continue amiodarone 400 mg po bid, reduce to 200 mg BID at discharge.  - Consider cardioversion as outpatient if does not convert to SR with amiodarone. 6. DM2: A1c 9.3 05/23 - SSI.  7. ID: Persistent leukocytosis.  Septic shock initially, resolved.  Blood cultures NGTD.  Treated for HCAP, completed vanc and cefepime.  With WBCs rising again and concern for aspiration, he was  started back on vanc/cefepime and micafungin given yeast in sputum. CT chest/abd/pelvis with left retroperitoneal fluid collection (?hemorrhage vs abscess), bilateral lower lobe infiltrates vs atelectasis, moderate loculated left pleural effusion.  He was transitioned 6/6 to Unasyn to cover aspiration PNA. IR aspiration of RP fluid collection => likely hematoma and not abscess. Afebrile, WBCs 17 => 15=>12.3 => 11.3  - Completed IV antibiotics.   8. Ileus: Resolved.  9. Pulmonary: Respiratory status improving with fluid removal and abx. Thoracentesis on 6/2.  - HD for volume removal  10. RP fluid collection: 6/7 S/P aspiration by IR. Likely hematoma.  11. Anemia: RP hematoma.  Hgb stable 7.7 today.  - Transfuse < 7.5.   Okay for discharge from HF perspective. TCTS planning for discharge once outpatient HD confirmed.  Can f[facility]u with [facility] in [place] after discharge. Reached out to Cards Master to assist with arranging f/u.  Meds:  Eliquis 5 BID Amiodarone reduce to 200 mg BID at discharge Midodrine 5 TID  Atorvastatin 80 Aspirin  81 mg daily  Length of Stay: 21  FINCH, LINDSAY N, PA-C  12/03/2021, 9:01 AM  Advanced Heart Failure Team Pager [phone] (M-F; 7a - 5p)  Please contact [facility] for night-coverage after hours (5p -7a ) and weekends on [email or web address]  Patient seen and examined with the above-signed Advanced Practice Provider and/or Housestaff. I personally reviewed laboratory data, imaging studies and relevant notes. I independently examined the patient and formulated the important aspects of the plan. I have edited the note to reflect any of my changes or salient points. I have personally discussed the plan with the patient and/or family.  Had iHD yesterday. Tolerated well. Eager to go home today. Denies CP, SOB, orthopnea or PND  Seems to be back in AF on tele. Rates 90-100.   General: Sitting in chair No resp difficulty HEENT: normal Neck: supple. JVP hard to see  Carotids 2+ bilat; no bruits. No lymphadenopathy or thryomegaly appreciated. Cor: PMI nondisplaced. Irregular rate & rhythm. No rubs, gallops or murmurs. + tunneled cath Lungs: clear Abdomen: obese soft, nontender, nondistended. No hepatosplenomegaly. No bruits or masses. Good bowel sounds. Extremities: no cyanosis, clubbing, rash, 1+ edema Neuro: alert & orientedx3, cranial nerves grossly intact. moves all 4 extremities w/o difficulty. Affect pleasant  Overall doing well. Still mildly volume overloaded but much improved. Volume status now being managed with HD. Seems to be back in AF despite 2 DC-CVs in the hospital. Rate relatively well controlled. Will leave in AF for now. Continue amio. If doesn't convert in 4 weeks can plan repeat DC-CV once further out from CABG. Continue Eliquis.   Glori Bickers, MD  9:22 AM

## 2021-12-03 NOTE — Progress Notes (Signed)
Pt refused CPAP for tonight. VS stable currently on RA.

## 2021-12-03 NOTE — Progress Notes (Signed)
Covering cardmaster today. Request received from CHF app to move up gen cards appt. No availability seen on initial schedule review so I have sent a message to our [place] office's scheduling team requesting a follow-up appointment as well as TOC phone call, and our office will call the patient with this information.

## 2021-12-03 NOTE — Progress Notes (Addendum)
Contacted [facility] admissions this am to request an update on approval for out-pt clinic. Awaiting response.   Melven Sartorius Renal Navigator [phone]  Addendum at 3:39 pm: Pt has been accepted at [facility] on MWF with 12:10 chair time. Pt can start on Friday. Pt will need to arrive at 11:10 to complete paperwork prior to treatment. Met with pt and pt's significant other at bedside to discuss above arrangements. Information sheet provided with clinic information and schedule noted.Pt agreeable to plan. Arrangements added to AVS as well. Update provided to attending's PA, RN, nephrologist, and RN CM. Update provided to inpt HD charge RN as well. Pt will treat first shift tomorrow and then d/c (clinic unable to start pt tomorrow). Will contact renal PA regarding clinic's need for orders.

## 2021-12-03 NOTE — Progress Notes (Addendum)
      [address]       [place] [address]             [phone]        4 Days Post-Op Procedure(s) (LRB): CARDIOVERSION (N/A)  Subjective: Patient sitting in chair. He really wants to go home. He states his anxiety from being here so long is at an "all time high".  Objective: Vital signs in last 24 hours: Temp:  [98.2 F (36.8 C)-98.9 F (37.2 C)] 98.3 F (36.8 C) (06/13 0400) Pulse Rate:  [70-96] 80 (06/13 0400) Cardiac Rhythm: Atrial fibrillation (06/12 2027) Resp:  [14-21] 20 (06/13 0400) BP: (94-165)/(52-85) 118/62 (06/13 0400) SpO2:  [87 %-100 %] 98 % (06/13 0400) Weight:  [120 kg-123.7 kg] 120 kg (06/13 0634)   Current Weight  12/03/21 120 kg       Intake/Output from previous day: 06/12 0701 - 06/13 0700 In: 440 [P.O.:440] Out: 3000    Physical Exam:  Cardiovascular: RRR Pulmonary: Clear to auscultation bilaterally Abdomen: Soft, non tender, bowel sounds present. Extremities: Mild bilateral lower extremity edema. Wounds: Clean and dry.  No erythema or signs of infection.  Lab Results: CBC: Recent Labs    12/02/21 0138 12/03/21 0409  WBC 12.3* 11.3*  HGB 7.7* 7.7*  HCT 24.2* 24.1*  PLT 338 320    BMET:  Recent Labs    12/02/21 0138 12/03/21 0409  NA 130* 131*  K 4.0 3.9  CL 94* 94*  CO2 20* 25  GLUCOSE 245* 205*  BUN 76* 52*  CREATININE 8.10* 5.87*  CALCIUM 8.4* 8.5*     PT/INR:  Lab Results  Component Value Date   INR 1.3 (H) 11/28/2021   INR 1.3 (H) 11/27/2021   INR 1.4 (H) 11/25/2021   ABG:  INR: Will add last result for INR, ABG once components are confirmed Will add last 4 CBG results once components are confirmed  Assessment/Plan: CV-PAF. SR, first degree heart block. Has had DCCV x 3. On Amiodarone 400 mg bid, Apixaban 5 mg bid, and Midodrine 10 mg tid. 2. Pulmonary-History of OSA (on CPAP). On room air. Encourage incentive spirometer. 3. Acute on chronic CHF-Heart failure had been  following 4. Expected post op blood loss anemia-H and H this am stable at 7.7 and 24.1. On Aranesp 5. DM-CBGs 358/260/192. Pre op HGA1C 9.3. On Insulin but will increase for better control. Will not restart Metformin or Glipizide yet as creatinine elevated. Appreciate diabetes coordinator recommendations. Will likely need to add meal coverage. Will also need close medical follow up after discharge 6. AKI on CKD (stage III)-Creatinine decreased to 5.87 this am. Nephrology following and arranging for HD 7. Physical deconditioning-Continue PT. Home PT ordered 8. Leukocytosis resolving-WBC this am decreased to 11,300. Blood culture show no growth to date. S/p CT guided aspiration by IR for retroperitoneal hematoma (hematoma). 55 cc removed  9. Hope to discharge soon when diabetes management determined and outpatient HD arranged  Donielle M ZimmermanPA-C 7:29 AM   patient examined and medical record reviewed,agree with above note.  Discharge instructions reviewed with patient regarding wound care, activity limits, and follow-up appointment. Dahlia Byes 12/03/2021

## 2021-12-03 NOTE — Inpatient Diabetes Management (Addendum)
Inpatient Diabetes Program Recommendations  AACE/ADA: New Consensus Statement on Inpatient Glycemic Control (2015)  Target Ranges:  Prepandial:   less than 140 mg/dL      Peak postprandial:   less than 180 mg/dL (1-2 hours)      Critically ill patients:  140 - 180 mg/dL   Lab Results  Component Value Date   GLUCAP 287 (H) 12/03/2021   HGBA1C 9.3 (H) 11/08/2021    Review of Glycemic Control  Inpatient Diabetes Program Recommendations:   Educated patient and ex-spouse on insulin pen use at home. Reviewed contents of insulin flexpen starter kit. Reviewed all steps if insulin pen including attachment of needle, 2-unit air shot, dialing up dose, giving injection, removing needle, disposal of sharps, storage of unused insulin, disposal of insulin etc. Patient able to provide successful return demonstration. Also reviewed troubleshooting with insulin pen. MD to give patient Rxs for insulin pens and insulin pen needles.   Patient has meter and plans to check CBGs qid. Discharge needs: Levemir Flextouch Pen [id number] Pen needles [id number]  No further questions.  Thank you, Nani Gasser. Lameisha Schuenemann, RN, MSN, CDE  Diabetes Coordinator Inpatient Glycemic Control Team Team Pager [phone] (8am-5pm) 12/03/2021 1:34 PM

## 2021-12-03 NOTE — Progress Notes (Signed)
Patient ID: [name] [name], male   DOB: 07/02/1955, 66 y.o.   MRN: [id number]  Assessment/Plan: Acute kidney injury on chronic kidney disease stage IIIa: -AKI likely ischemic ATN in the setting of septic versus cardiogenic shock.  Started on CRRT 5/26 for extracorporeal volume unloading after he had fixed urine output on Lasix drip that was inadequate for volume unloading. CRRT 5/26-5/31.   New temp HD catheter placed 11/21/21 and started IHD on 6/3 and tolerating it well.  Will continue with MWF schedule. Appears to be on for 1st shift today Appreciate RIJ TDC placement 11/28/21 by IR.  No evidence of renal recovery at this junction; HD 6/12 3L. Will need outpatient HD to be arranged once closer to discharge; we're waiting to hear back from [facility]. Will write for orders for HD tomorrow in case he's still here. Coronary artery disease status post three-vessel CABG: -on aspirin/statin. Left hip/back pain - CT scan with left psoas muscle hematoma vs abscess.  WBC improving.  Plan per primary svc Is moving better; more difficult to get back in bed. Acute on chronic dCHF -UF as tolerated with HD, volume status improving -milrinone stopped 5/29 Shock -possibly from sepsis+cardiogenic. Abx/pressor support per primary service. Sepsis w/u per CCM/  -off pressors and on midodrine now. Leukocytosis - completed Unasyn to cover possible aspiration PNA. WBC improving trend. Atrial fibrillation: -Status post MAZE procedure at the time of CABG. Per primary service, on amio. S/p tee-dccv 6/1.  S/p a successful cardioversion 11/25/21.  Anemia: -Likely associated with recent surgery and postoperative inflammatory response.  transfuse PRN for hgb <7-iron panel=iron deficient and he has completed abx course; and there is no clearcut source of infection + improving trend in WBC. ESA started 6/5 Hyponatremia -Managing with HD and UF, improved Left Pleural Effusion -s/p thora 6/2, 900cc removed  Disposition - unclear if he will  require SNF or CIR.  Will need outpatient HD arrangements to be made. [facility] but waiting for actual seat; will d/w renal navigator today.   S: Feels well, no new complaints.  O:BP (!) 104/55 (BP Location: Right Arm)   Pulse 94   Temp (!) 97.4 F (36.3 C) (Oral)   Resp 19   Ht 5\' 10"  (1.778 m)   Wt 120 kg   SpO2 97%   BMI 37.96 kg/m   Intake/Output Summary (Last 24 hours) at 12/03/2021 1043 Last data filed at 12/03/2021 0400 Gross per 24 hour  Intake 440 ml  Output 3000 ml  Net -2560 ml    Intake/Output: I/O last 3 completed shifts: In: 440 [P.O.:440] Out: 3000 [Other:3000]  Intake/Output this shift:  No intake/output data recorded. Weight change: -0.2 kg Gen: NAD CVS: RRR Resp:CTA Abd: +BS, soft, NT/ND HAL:PFXTK-2+ pretibial edema bilaterally Access: RIJ TC  Recent Labs  Lab 11/27/21 0145 11/28/21 0454 11/29/21 0550 11/30/21 0136 12/01/21 0111 12/02/21 0138 12/03/21 0409  NA 131* 130* 130* 134* 130* 130* 131*  K 4.3 3.9 4.0 3.6 3.6 4.0 3.9  CL 93* 93* 93* 95* 92* 94* 94*  CO2 20* 25 22 23 22  20* 25  GLUCOSE 243* 235* 187* 162* 224* 245* 205*  BUN 52* 39* 63* 43* 61* 76* 52*  CREATININE 6.66* 5.35* 7.44* 5.43* 7.10* 8.10* 5.87*  ALBUMIN 2.6* 2.2* 2.3* 2.2* 2.2* 2.3* 2.3*  CALCIUM 8.4* 8.0* 8.1* 8.3* 8.3* 8.4* 8.5*  PHOS 7.8* 5.8* 6.9* 5.1* 6.7* 7.2* 5.2*   Liver Function Tests: Recent Labs  Lab 12/01/21 0111 12/02/21 0138 12/03/21 0409  ALBUMIN  2.2* 2.3* 2.3*   No results for input(s): "LIPASE", "AMYLASE" in the last 168 hours. No results for input(s): "AMMONIA" in the last 168 hours. CBC: Recent Labs  Lab 11/29/21 0550 11/30/21 0136 12/01/21 0111 12/02/21 0138 12/03/21 0409  WBC 17.8* 15.2* 13.7* 12.3* 11.3*  HGB 8.4* 7.7* 7.8* 7.7* 7.7*  HCT 25.9* 24.7* 23.7* 24.2* 24.1*  MCV 92.5 92.9 90.8 91.3 90.9  PLT 352 342 319 338 320   Cardiac Enzymes: No results for input(s): "CKTOTAL", "CKMB", "CKMBINDEX", "TROPONINI" in the last 168  hours. CBG: Recent Labs  Lab 12/01/21 1629 12/02/21 0636 12/02/21 1721 12/02/21 2234 12/03/21 0601  GLUCAP 285* 237* 358* 260* 192*    Iron Studies: No results for input(s): "IRON", "TIBC", "TRANSFERRIN", "FERRITIN" in the last 72 hours. Studies/Results: No results found.   amiodarone  400 mg Oral BID   apixaban  5 mg Oral BID   aspirin EC  81 mg Oral Daily   atorvastatin  80 mg Oral Daily   Chlorhexidine Gluconate Cloth  6 each Topical Daily   darbepoetin (ARANESP) injection - DIALYSIS  100 mcg Intravenous Q Mon-HD   docusate sodium  200 mg Oral Daily   guaiFENesin  1,200 mg Oral BID   insulin aspart  0-24 Units Subcutaneous TID WC   insulin detemir  34 Units Subcutaneous BID   lidocaine  1 patch Transdermal Q24H   midodrine  5 mg Oral TID WC   multivitamin  1 tablet Oral QHS   pantoprazole  40 mg Oral Daily   sodium chloride flush  3 mL Intravenous Q12H    BMET    Component Value Date/Time   NA 131 (L) 12/03/2021 0409   NA 139 10/21/2021 1547   K 3.9 12/03/2021 0409   CL 94 (L) 12/03/2021 0409   CO2 25 12/03/2021 0409   GLUCOSE 205 (H) 12/03/2021 0409   BUN 52 (H) 12/03/2021 0409   BUN 29 (H) 10/21/2021 1547   CREATININE 5.87 (H) 12/03/2021 0409   CALCIUM 8.5 (L) 12/03/2021 0409   GFRNONAA 10 (L) 12/03/2021 0409   GFRAA 42 (L) 10/18/2019 1006   CBC    Component Value Date/Time   WBC 11.3 (H) 12/03/2021 0409   RBC 2.65 (L) 12/03/2021 0409   HGB 7.7 (L) 12/03/2021 0409   HGB 13.4 10/21/2021 1547   HCT 24.1 (L) 12/03/2021 0409   HCT 40.8 10/21/2021 1547   PLT 320 12/03/2021 0409   PLT 246 10/21/2021 1547   MCV 90.9 12/03/2021 0409   MCV 91 10/21/2021 1547   MCH 29.1 12/03/2021 0409   MCHC 32.0 12/03/2021 0409   RDW 15.2 12/03/2021 0409   RDW 14.6 10/21/2021 1547   LYMPHSABS 1.4 11/26/2021 0436   MONOABS 2.8 (H) 11/26/2021 0436   EOSABS 0.3 11/26/2021 0436   BASOSABS 0.1 11/26/2021 0436

## 2021-12-04 LAB — GLUCOSE, CAPILLARY
Glucose-Capillary: 218 mg/dL — ABNORMAL HIGH (ref 70–99)
Glucose-Capillary: 199 mg/dL — ABNORMAL HIGH (ref 70–99)

## 2021-12-04 LAB — RENAL FUNCTION PANEL
Albumin: 2.4 g/dL — ABNORMAL LOW (ref 3.5–5.0)
Anion gap: 15 (ref 5–15)
BUN: 69 mg/dL — ABNORMAL HIGH (ref 8–23)
CO2: 24 mmol/L (ref 22–32)
Calcium: 8.8 mg/dL — ABNORMAL LOW (ref 8.9–10.3)
Chloride: 95 mmol/L — ABNORMAL LOW (ref 98–111)
Creatinine, Ser: 6.88 mg/dL — ABNORMAL HIGH (ref 0.61–1.24)
GFR, Estimated: 8 mL/min — ABNORMAL LOW (ref >60)
Glucose, Bld: 164 mg/dL — ABNORMAL HIGH (ref 70–99)
Phosphorus: 6.7 mg/dL — ABNORMAL HIGH (ref 2.5–4.6)
Potassium: 3.6 mmol/L (ref 3.5–5.1)
Sodium: 134 mmol/L — ABNORMAL LOW (ref 135–145)

## 2021-12-04 LAB — MAGNESIUM: Magnesium: 2.1 mg/dL (ref 1.7–2.4)

## 2021-12-04 MED ORDER — HEPARIN SODIUM (PORCINE) 1000 UNIT/ML IJ SOLN
INTRAMUSCULAR | Status: AC
  Administered 2021-12-04: 4000 [IU]
  Filled 2021-12-04: qty 3

## 2021-12-04 NOTE — Progress Notes (Signed)
Contacted [facility] to confirm that pt will d/c today and start at clinic on Friday. Contacted renal PA yesterday regarding clinic's need for orders. Met with pt yesterday to discuss out-pt HD details.   Melven Sartorius Renal Navigator [phone]

## 2021-12-04 NOTE — Progress Notes (Addendum)
      [address]       [place] [address]             [phone]       5 Days Post-Op Procedure(s) (LRB): CARDIOVERSION (N/A)  Subjective:  Patient currently getting dialysis.  States he is going home today.  Objective: Vital signs in last 24 hours: Temp:  [97.4 F (36.3 C)-98.1 F (36.7 C)] 98 F (36.7 C) (06/14 0704) Pulse Rate:  [78-98] 88 (06/14 0730) Cardiac Rhythm: Atrial fibrillation (06/14 0710) Resp:  [15-25] 15 (06/14 0730) BP: (104-126)/(51-65) 119/65 (06/14 0730) SpO2:  [97 %-100 %] 99 % (06/14 0429) Weight:  [120.5 kg-120.9 kg] 120.9 kg (06/14 0711)  General appearance: alert, cooperative, and no distress Heart: regular rate and rhythm Lungs: clear to auscultation bilaterally Abdomen: soft, non-tender; bowel sounds normal; no masses,  no organomegaly Extremities: edema trace Wound: clean and dry  Lab Results: Recent Labs    12/02/21 0138 12/03/21 0409  WBC 12.3* 11.3*  HGB 7.7* 7.7*  HCT 24.2* 24.1*  PLT 338 320   BMET:  Recent Labs    12/03/21 0409 12/04/21 0227  NA 131* 134*  K 3.9 3.6  CL 94* 95*  CO2 25 24  GLUCOSE 205* 164*  BUN 52* 69*  CREATININE 5.87* 6.88*  CALCIUM 8.5* 8.8*    PT/INR: No results for input(s): "LABPROT", "INR" in the last 72 hours. ABG    Component Value Date/Time   PHART 7.332 (L) 11/15/2021 0410   HCO3 24.2 11/16/2021 0414   TCO2 26 11/16/2021 0414   ACIDBASEDEF 3.0 (H) 11/16/2021 0414   O2SAT 56.1 11/21/2021 0612   CBG (last 3)  Recent Labs    12/03/21 1716 12/03/21 2140 12/04/21 0626  GLUCAP 259* 205* 218*    Assessment/Plan: S/P Procedure(s) (LRB): CARDIOVERSION (N/A)  CV- PAF, maintaining NSR with 1st degree AV block, S/P Cardioversion DCCV x 3 - on Amiodarone, Eliquis, and Midodrine Pulm- CPAP nightly, not requiring oxygen, continue IS Renal- AKI, on CKD-now on HD, currently on MWF schedule, outpatient schedule has been arranged Deconditioning- PT/OT recs home  health, arrnagements have been made Dispo- patient stable, in dialysis currently, outpatient facility has been arranged, will d/c home this afternoon   LOS: 22 days    Ellwood Handler, PA-C 12/04/2021  Agree with discharge plan- home resources have been established and dialysis referral established.Discharge instructions reviewed with patient.

## 2021-12-04 NOTE — Progress Notes (Signed)
Patient ID: [name] [name], male   DOB: 06-26-1955, 66 y.o.   MRN: [id number]  Assessment/Plan: Acute kidney injury on chronic kidney disease stage IIIa: -AKI likely ischemic ATN in the setting of septic versus cardiogenic shock.  Started on CRRT 5/26 for extracorporeal volume unloading after he had fixed urine output on Lasix drip that was inadequate for volume unloading. CRRT 5/26-5/31.   New temp HD catheter placed 11/21/21 and started IHD on 6/3 and tolerating it well.  Will continue with MWF schedule. Appears to be on for 1st shift today Appreciate RIJ TDC placement 11/28/21 by IR.  No evidence of renal recovery at this junction; HD 6/12 3L. Appreciate renal navigator arranging outpatient HD @ [facility] he needs to be there on Friday to fill out paperwork. Will monitor for signs of recovery at [facility]. Seen on HD through RIJ TC, 2K bath 3L net UF, tolerating.  Coronary artery disease status post three-vessel CABG: -on aspirin/statin. Left hip/back pain - CT scan with left psoas muscle hematoma vs abscess.  WBC improving.  Plan per primary svc Is moving better; more difficult to get back in bed. But he is ready to go home. Acute on chronic dCHF -UF as tolerated with HD, volume status improving -milrinone stopped 5/29 Shock -possibly from sepsis+cardiogenic. Abx/pressor support per primary service. Sepsis w/u per CCM/  -off pressors and on midodrine now. Leukocytosis - completed Unasyn to cover possible aspiration PNA. WBC improving trend. Atrial fibrillation: -Status post MAZE procedure at the time of CABG. Per primary service, on amio. S/p tee-dccv 6/1.  S/p a successful cardioversion 11/25/21.  Anemia: -Likely associated with recent surgery and postoperative inflammatory response.  transfuse PRN for hgb <7-iron panel=iron deficient and he has completed abx course; and there is no clearcut source of infection + improving trend in WBC. ESA started 6/5 Hyponatremia -Managing with HD and UF, improved Left Pleural  Effusion -s/p thora 6/2, 900cc removed  Disposition - Actually going home.  Has seat for [facility].   S: Feels well, no new complaints. Excited he's going home today.   O:BP 117/70 (BP Location: Right Arm)   Pulse 94   Temp 98 F (36.7 C) (Oral)   Resp (!) 21   Ht 5\' 10"  (1.778 m)   Wt 120.9 kg   SpO2 92%   BMI 38.24 kg/m  No intake or output data in the 24 hours ending 12/04/21 0858   Intake/Output: I/O last 3 completed shifts: In: 440 [P.O.:440] Out: -   Intake/Output this shift:  No intake/output data recorded. Weight change: -3.2 kg Gen: NAD CVS: RRR Resp:CTA Abd: +BS, soft, NT/ND ESP:[id number]-7+ pretibial edema bilaterally Access: RIJ TC  Recent Labs  Lab 11/28/21 0454 11/29/21 0550 11/30/21 0136 12/01/21 0111 12/02/21 0138 12/03/21 0409 12/04/21 0227  NA 130* 130* 134* 130* 130* 131* 134*  K 3.9 4.0 3.6 3.6 4.0 3.9 3.6  CL 93* 93* 95* 92* 94* 94* 95*  CO2 25 22 23 22  20* 25 24  GLUCOSE 235* 187* 162* 224* 245* 205* 164*  BUN 39* 63* 43* 61* 76* 52* 69*  CREATININE 5.35* 7.44* 5.43* 7.10* 8.10* 5.87* 6.88*  ALBUMIN 2.2* 2.3* 2.2* 2.2* 2.3* 2.3* 2.4*  CALCIUM 8.0* 8.1* 8.3* 8.3* 8.4* 8.5* 8.8*  PHOS 5.8* 6.9* 5.1* 6.7* 7.2* 5.2* 6.7*   Liver Function Tests: Recent Labs  Lab 12/02/21 0138 12/03/21 0409 12/04/21 0227  ALBUMIN 2.3* 2.3* 2.4*   No results for input(s): "LIPASE", "AMYLASE" in the last 168 hours. No results  for input(s): "AMMONIA" in the last 168 hours. CBC: Recent Labs  Lab 11/29/21 0550 11/30/21 0136 12/01/21 0111 12/02/21 0138 12/03/21 0409  WBC 17.8* 15.2* 13.7* 12.3* 11.3*  HGB 8.4* 7.7* 7.8* 7.7* 7.7*  HCT 25.9* 24.7* 23.7* 24.2* 24.1*  MCV 92.5 92.9 90.8 91.3 90.9  PLT 352 342 319 338 320   Cardiac Enzymes: No results for input(s): "CKTOTAL", "CKMB", "CKMBINDEX", "TROPONINI" in the last 168 hours. CBG: Recent Labs  Lab 12/03/21 0601 12/03/21 1252 12/03/21 1716 12/03/21 2140 12/04/21 0626  GLUCAP 192* 287* 259* 205*  218*    Iron Studies: No results for input(s): "IRON", "TIBC", "TRANSFERRIN", "FERRITIN" in the last 72 hours. Studies/Results: No results found.   amiodarone  400 mg Oral BID   apixaban  5 mg Oral BID   aspirin EC  81 mg Oral Daily   atorvastatin  80 mg Oral Daily   Chlorhexidine Gluconate Cloth  6 each Topical Daily   darbepoetin (ARANESP) injection - DIALYSIS  100 mcg Intravenous Q Mon-HD   docusate sodium  200 mg Oral Daily   guaiFENesin  1,200 mg Oral BID   insulin aspart  0-24 Units Subcutaneous TID WC   insulin detemir  34 Units Subcutaneous BID   lidocaine  1 patch Transdermal Q24H   midodrine  5 mg Oral TID WC   multivitamin  1 tablet Oral QHS   pantoprazole  40 mg Oral Daily   sodium chloride flush  3 mL Intravenous Q12H    BMET    Component Value Date/Time   NA 134 (L) 12/04/2021 0227   NA 139 10/21/2021 1547   K 3.6 12/04/2021 0227   CL 95 (L) 12/04/2021 0227   CO2 24 12/04/2021 0227   GLUCOSE 164 (H) 12/04/2021 0227   BUN 69 (H) 12/04/2021 0227   BUN 29 (H) 10/21/2021 1547   CREATININE 6.88 (H) 12/04/2021 0227   CALCIUM 8.8 (L) 12/04/2021 0227   GFRNONAA 8 (L) 12/04/2021 0227   GFRAA 42 (L) 10/18/2019 1006   CBC    Component Value Date/Time   WBC 11.3 (H) 12/03/2021 0409   RBC 2.65 (L) 12/03/2021 0409   HGB 7.7 (L) 12/03/2021 0409   HGB 13.4 10/21/2021 1547   HCT 24.1 (L) 12/03/2021 0409   HCT 40.8 10/21/2021 1547   PLT 320 12/03/2021 0409   PLT 246 10/21/2021 1547   MCV 90.9 12/03/2021 0409   MCV 91 10/21/2021 1547   MCH 29.1 12/03/2021 0409   MCHC 32.0 12/03/2021 0409   RDW 15.2 12/03/2021 0409   RDW 14.6 10/21/2021 1547   LYMPHSABS 1.4 11/26/2021 0436   MONOABS 2.8 (H) 11/26/2021 0436   EOSABS 0.3 11/26/2021 0436   BASOSABS 0.1 11/26/2021 0436

## 2021-12-04 NOTE — Progress Notes (Signed)
OT Cancellation Note  Patient Details Name: [name] [name] MRN: [id number] DOB: 06/29/1955   Cancelled Treatment:    Reason Eval/Treat Not Completed: Patient at procedure or test/ unavailable (HD)  [place],HILLARY 12/04/2021, 7:23 AM Maurie Boettcher, OT/L   Acute OT Clinical Specialist [place] Pager [phone] Office [phone]

## 2021-12-04 NOTE — Progress Notes (Signed)
Received patient in bed, alert and oriented. Informed consent signed and in chart.  Time tx initiated: 7  Pre HD weight:     Pre HD VS: BP 126 60 HR: 92  Time tx completed: 1111  HD treatment completed. Patient tolerated well. HD catheter without signs and symptoms of complications. During treatment, client at [place] [employer]. Patient transported back to the room, alert and orient and in no acute distress. No complaints of pain or discomfort. 1.52mL heparin lock for intercath. Report given to bedside RN.  Total UF removed: 3L  Post HD VS: 117/63  95 HR 22R 95%  Post HD weight: 117.4kg

## 2021-12-04 NOTE — Progress Notes (Signed)
CARDIAC REHAB PHASE I   D/c education reinforced with pt and family. Stressed importance of ambulation, IS use, and sternal precautions. Referred to CRP II [facility] [place].  [id number] Rufina Falco, RN BSN 12/04/2021 12:05 PM

## 2021-12-04 NOTE — TOC Transition Note (Signed)
Transition of Care [facility]) - CM/SW Discharge Note   Patient Details  Name: [name] [name] MRN: [id number] Date of Birth: 1955-11-22  [facility] CM/SW Contact:  Zenon Mayo, RN Phone Number: 12/04/2021, 10:13 AM   Clinical Narrative:    Patient is for dc today, the DME will be delivered to the room.  Patient  will have transport at dc.  NCM notified Tommi Rumps with [facility] that patient is for dc      Barriers to Discharge: Continued Medical Work up   Patient Goals and CMS Choice Patient states their goals for this hospitalization and ongoing recovery are:: wants to be able go home CMS Medicare.gov Compare Post Acute Care list provided to:: Patient Choice offered to / list presented to : Patient  Discharge Placement                       Discharge Plan and Services   Discharge Planning Services: CM Consult Post Acute Care Choice: Home Health                               Social Determinants of Health (SDOH) Interventions     Readmission Risk Interventions     No data to display

## 2021-12-04 NOTE — Plan of Care (Signed)
  Problem: Education: Goal: Knowledge of disease or condition will improve Outcome: Progressing   Problem: Activity: Goal: Risk for activity intolerance will decrease Outcome: Progressing   Problem: Cardiac: Goal: Will achieve and/or maintain hemodynamic stability Outcome: Progressing   Problem: Clinical Measurements: Goal: Postoperative complications will be avoided or minimized Outcome: Progressing   Problem: Skin Integrity: Goal: Wound healing without signs and symptoms of infection Outcome: Progressing   Problem: Urinary Elimination: Goal: Ability to achieve and maintain adequate renal perfusion and functioning will improve Outcome: Progressing   Problem: Education: Goal: Knowledge of General Education information will improve Description: Including pain rating scale, medication(s)/side effects and non-pharmacologic comfort measures Outcome: Progressing   Problem: Health Behavior/Discharge Planning: Goal: Ability to manage health-related needs will improve Outcome: Progressing   Problem: Clinical Measurements: Goal: Cardiovascular complication will be avoided Outcome: Progressing   Problem: Activity: Goal: Risk for activity intolerance will decrease Outcome: Progressing   Problem: Nutrition: Goal: Adequate nutrition will be maintained Outcome: Progressing   Problem: Coping: Goal: Level of anxiety will decrease Outcome: Progressing

## 2021-12-05 ENCOUNTER — Telehealth: Payer: Self-pay | Admitting: Physician Assistant

## 2021-12-05 DIAGNOSIS — I10 Essential (primary) hypertension: Secondary | ICD-10-CM | POA: Insufficient documentation

## 2021-12-05 DIAGNOSIS — T7840XA Allergy, unspecified, initial encounter: Secondary | ICD-10-CM | POA: Insufficient documentation

## 2021-12-05 DIAGNOSIS — Z951 Presence of aortocoronary bypass graft: Secondary | ICD-10-CM | POA: Insufficient documentation

## 2021-12-05 DIAGNOSIS — N179 Acute kidney failure, unspecified: Secondary | ICD-10-CM | POA: Insufficient documentation

## 2021-12-05 DIAGNOSIS — D72829 Elevated white blood cell count, unspecified: Secondary | ICD-10-CM | POA: Insufficient documentation

## 2021-12-05 HISTORY — DX: Acute kidney failure, unspecified: N17.9

## 2021-12-05 NOTE — Telephone Encounter (Signed)
Transition of care contact from inpatient facility  Date of Discharge: 12/04/21 Date of Contact: 12/05/21 Method of contact: Phone  Attempted to contact patient to discuss transition of care from inpatient admission. Patient did not answer the phone but called [place] back. Reports he is doing well. Aware of HD appt tomorrow and has all of his medications. No concerns today.   Anice Paganini, PA-C 12/05/2021, 2:20 PM  [facility] Pager: [phone]

## 2021-12-06 ENCOUNTER — Telehealth (HOSPITAL_COMMUNITY): Payer: Self-pay

## 2021-12-06 DIAGNOSIS — N2581 Secondary hyperparathyroidism of renal origin: Secondary | ICD-10-CM | POA: Diagnosis not present

## 2021-12-06 DIAGNOSIS — Z992 Dependence on renal dialysis: Secondary | ICD-10-CM | POA: Diagnosis not present

## 2021-12-06 DIAGNOSIS — N179 Acute kidney failure, unspecified: Secondary | ICD-10-CM | POA: Diagnosis not present

## 2021-12-06 NOTE — Telephone Encounter (Signed)
Per phase I cardiac rehab, fax cardiac rehab referral to [facility] cardiac rehab.

## 2021-12-09 DIAGNOSIS — Z992 Dependence on renal dialysis: Secondary | ICD-10-CM | POA: Diagnosis not present

## 2021-12-09 DIAGNOSIS — N179 Acute kidney failure, unspecified: Secondary | ICD-10-CM | POA: Diagnosis not present

## 2021-12-09 DIAGNOSIS — N2581 Secondary hyperparathyroidism of renal origin: Secondary | ICD-10-CM | POA: Diagnosis not present

## 2021-12-11 DIAGNOSIS — N2581 Secondary hyperparathyroidism of renal origin: Secondary | ICD-10-CM | POA: Diagnosis not present

## 2021-12-11 DIAGNOSIS — Z992 Dependence on renal dialysis: Secondary | ICD-10-CM | POA: Diagnosis not present

## 2021-12-11 DIAGNOSIS — N179 Acute kidney failure, unspecified: Secondary | ICD-10-CM | POA: Diagnosis not present

## 2021-12-13 DIAGNOSIS — Z992 Dependence on renal dialysis: Secondary | ICD-10-CM | POA: Diagnosis not present

## 2021-12-13 DIAGNOSIS — N179 Acute kidney failure, unspecified: Secondary | ICD-10-CM | POA: Diagnosis not present

## 2021-12-13 DIAGNOSIS — N2581 Secondary hyperparathyroidism of renal origin: Secondary | ICD-10-CM | POA: Diagnosis not present

## 2021-12-16 DIAGNOSIS — Z992 Dependence on renal dialysis: Secondary | ICD-10-CM | POA: Diagnosis not present

## 2021-12-16 DIAGNOSIS — N179 Acute kidney failure, unspecified: Secondary | ICD-10-CM | POA: Diagnosis not present

## 2021-12-16 DIAGNOSIS — N2581 Secondary hyperparathyroidism of renal origin: Secondary | ICD-10-CM | POA: Diagnosis not present

## 2021-12-17 ENCOUNTER — Other Ambulatory Visit: Payer: Self-pay

## 2021-12-17 ENCOUNTER — Telehealth: Payer: Self-pay

## 2021-12-17 ENCOUNTER — Telehealth: Payer: Self-pay | Admitting: Cardiology

## 2021-12-17 DIAGNOSIS — I480 Paroxysmal atrial fibrillation: Secondary | ICD-10-CM

## 2021-12-17 MED ORDER — INSULIN DETEMIR 100 UNIT/ML FLEXPEN
30.0000 [IU] | PEN_INJECTOR | Freq: Two times a day (BID) | SUBCUTANEOUS | 11 refills | Status: DC
Start: 2021-12-17 — End: 2022-09-03

## 2021-12-17 MED ORDER — ATORVASTATIN CALCIUM 80 MG PO TABS: 80.0000 mg | ORAL_TABLET | Freq: Every day | ORAL | 3 refills | Status: DC

## 2021-12-17 MED ORDER — MIDODRINE HCL 5 MG PO TABS: 5.0000 mg | ORAL_TABLET | Freq: Three times a day (TID) | ORAL | 3 refills | Status: DC

## 2021-12-17 MED ORDER — APIXABAN 5 MG PO TABS: 5.0000 mg | ORAL_TABLET | Freq: Two times a day (BID) | ORAL | 1 refills | Status: DC

## 2021-12-17 MED ORDER — AMIODARONE HCL 200 MG PO TABS: 200.0000 mg | ORAL_TABLET | Freq: Two times a day (BID) | ORAL | 3 refills | Status: DC

## 2021-12-17 NOTE — Telephone Encounter (Signed)
*  STAT* If patient is at the pharmacy, call can be transferred to refill team.   1. Which medications need to be refilled? (please list name of each medication and dose if known) amiodarone (PACERONE) 200 MG tablet; apixaban (ELIQUIS) 5 MG TABS tablet; atorvastatin (LIPITOR) 80 MG tablet; insulin detemir (LEVEMIR) 100 UNIT/ML FlexPen;  midodrine (PROAMATINE) 5 MG tablet  2. Which pharmacy/location (including street and city if local pharmacy) is medication to be sent to? [facility] - [place], [place] - [address]  3. Do they need a 30 day or 90 day supply? 90

## 2021-12-18 DIAGNOSIS — N179 Acute kidney failure, unspecified: Secondary | ICD-10-CM | POA: Diagnosis not present

## 2021-12-18 DIAGNOSIS — Z992 Dependence on renal dialysis: Secondary | ICD-10-CM | POA: Diagnosis not present

## 2021-12-18 DIAGNOSIS — N2581 Secondary hyperparathyroidism of renal origin: Secondary | ICD-10-CM | POA: Diagnosis not present

## 2021-12-20 DIAGNOSIS — Z992 Dependence on renal dialysis: Secondary | ICD-10-CM | POA: Diagnosis not present

## 2021-12-20 DIAGNOSIS — N179 Acute kidney failure, unspecified: Secondary | ICD-10-CM | POA: Diagnosis not present

## 2021-12-20 DIAGNOSIS — N2581 Secondary hyperparathyroidism of renal origin: Secondary | ICD-10-CM | POA: Diagnosis not present

## 2021-12-23 ENCOUNTER — Telehealth: Payer: Self-pay

## 2021-12-23 DIAGNOSIS — N2581 Secondary hyperparathyroidism of renal origin: Secondary | ICD-10-CM | POA: Diagnosis not present

## 2021-12-23 DIAGNOSIS — N179 Acute kidney failure, unspecified: Secondary | ICD-10-CM | POA: Diagnosis not present

## 2021-12-23 DIAGNOSIS — Z992 Dependence on renal dialysis: Secondary | ICD-10-CM | POA: Diagnosis not present

## 2021-12-23 NOTE — Telephone Encounter (Signed)
Received a fax from [employer] that Eliquis does not require a P/A, however it has been denied.  I called [employer] and they stated that it was denied due to the pt being on Xarelto.  I done an appeal on the phone. Pt is no longer taking Xarelto, he was switched to Eliquis.  They will notify our office within 72 hours of the outcome.   Ref# [id number] [phone] to check status

## 2021-12-25 ENCOUNTER — Telehealth: Payer: Self-pay | Admitting: Cardiology

## 2021-12-25 ENCOUNTER — Other Ambulatory Visit: Payer: Self-pay

## 2021-12-25 DIAGNOSIS — N179 Acute kidney failure, unspecified: Secondary | ICD-10-CM | POA: Diagnosis not present

## 2021-12-25 DIAGNOSIS — N2581 Secondary hyperparathyroidism of renal origin: Secondary | ICD-10-CM | POA: Diagnosis not present

## 2021-12-25 DIAGNOSIS — Z992 Dependence on renal dialysis: Secondary | ICD-10-CM | POA: Diagnosis not present

## 2021-12-25 NOTE — Telephone Encounter (Signed)
Patient aware to contact PCP for this refill.

## 2021-12-25 NOTE — Telephone Encounter (Signed)
Eliquis has been approved from 06/23/21-06/22/22.  Reference #[id number] Member [id number]

## 2021-12-25 NOTE — Telephone Encounter (Signed)
*  STAT* If patient is at the pharmacy, call can be transferred to refill team.   1. Which medications need to be refilled? (please list name of each medication and dose if known) insulin detemir (LEVEMIR) 100 UNIT/ML FlexPen  2. Which pharmacy/location (including street and city if local pharmacy) is medication to be sent to? [place], [place] - [address]  3. Do they need a 30 day or 90 day supply? 30 day  Patient has 1 left he is using now.

## 2021-12-26 ENCOUNTER — Telehealth: Payer: Self-pay | Admitting: *Deleted

## 2021-12-26 DIAGNOSIS — E1140 Type 2 diabetes mellitus with diabetic neuropathy, unspecified: Secondary | ICD-10-CM | POA: Diagnosis not present

## 2021-12-26 DIAGNOSIS — E782 Mixed hyperlipidemia: Secondary | ICD-10-CM | POA: Diagnosis not present

## 2021-12-26 DIAGNOSIS — I4891 Unspecified atrial fibrillation: Secondary | ICD-10-CM | POA: Diagnosis not present

## 2021-12-26 DIAGNOSIS — I2510 Atherosclerotic heart disease of native coronary artery without angina pectoris: Secondary | ICD-10-CM | POA: Diagnosis not present

## 2021-12-26 DIAGNOSIS — Z79899 Other long term (current) drug therapy: Secondary | ICD-10-CM | POA: Diagnosis not present

## 2021-12-26 DIAGNOSIS — Z992 Dependence on renal dialysis: Secondary | ICD-10-CM | POA: Diagnosis not present

## 2021-12-26 DIAGNOSIS — N186 End stage renal disease: Secondary | ICD-10-CM | POA: Diagnosis not present

## 2021-12-26 MED ORDER — AMIODARONE HCL 200 MG PO TABS: 200.0000 mg | ORAL_TABLET | Freq: Two times a day (BID) | ORAL | 3 refills | Status: DC

## 2021-12-26 NOTE — Telephone Encounter (Signed)
[facility] sent fax stating pt should not be on Amiodarone and Propafenone at same time. Resent rx and put note to pharmacy that pt is no longer on the Propafenone.

## 2021-12-27 DIAGNOSIS — N179 Acute kidney failure, unspecified: Secondary | ICD-10-CM | POA: Diagnosis not present

## 2021-12-27 DIAGNOSIS — N2581 Secondary hyperparathyroidism of renal origin: Secondary | ICD-10-CM | POA: Diagnosis not present

## 2021-12-27 DIAGNOSIS — Z992 Dependence on renal dialysis: Secondary | ICD-10-CM | POA: Diagnosis not present

## 2021-12-30 ENCOUNTER — Ambulatory Visit: Payer: Medicare HMO | Admitting: Cardiothoracic Surgery

## 2021-12-30 DIAGNOSIS — Z992 Dependence on renal dialysis: Secondary | ICD-10-CM | POA: Diagnosis not present

## 2021-12-30 DIAGNOSIS — N2581 Secondary hyperparathyroidism of renal origin: Secondary | ICD-10-CM | POA: Diagnosis not present

## 2021-12-30 DIAGNOSIS — N179 Acute kidney failure, unspecified: Secondary | ICD-10-CM | POA: Diagnosis not present

## 2021-12-31 ENCOUNTER — Other Ambulatory Visit: Payer: Self-pay | Admitting: Cardiothoracic Surgery

## 2021-12-31 DIAGNOSIS — Z951 Presence of aortocoronary bypass graft: Secondary | ICD-10-CM

## 2022-01-01 DIAGNOSIS — N2581 Secondary hyperparathyroidism of renal origin: Secondary | ICD-10-CM | POA: Diagnosis not present

## 2022-01-01 DIAGNOSIS — Z992 Dependence on renal dialysis: Secondary | ICD-10-CM | POA: Diagnosis not present

## 2022-01-01 DIAGNOSIS — N179 Acute kidney failure, unspecified: Secondary | ICD-10-CM | POA: Diagnosis not present

## 2022-01-03 DIAGNOSIS — Z992 Dependence on renal dialysis: Secondary | ICD-10-CM | POA: Diagnosis not present

## 2022-01-03 DIAGNOSIS — N2581 Secondary hyperparathyroidism of renal origin: Secondary | ICD-10-CM | POA: Diagnosis not present

## 2022-01-03 DIAGNOSIS — N179 Acute kidney failure, unspecified: Secondary | ICD-10-CM | POA: Diagnosis not present

## 2022-01-06 ENCOUNTER — Ambulatory Visit
Admission: RE | Admit: 2022-01-06 | Discharge: 2022-01-06 | Disposition: A | Discharge status: Home / Self Care | Payer: Medicare HMO | Source: Ambulatory Visit | Attending: Cardiothoracic Surgery | Admitting: Cardiothoracic Surgery

## 2022-01-06 ENCOUNTER — Ambulatory Visit (INDEPENDENT_AMBULATORY_CARE_PROVIDER_SITE_OTHER): Payer: Self-pay | Admitting: Cardiothoracic Surgery

## 2022-01-06 ENCOUNTER — Encounter: Payer: Self-pay | Admitting: Cardiothoracic Surgery

## 2022-01-06 ENCOUNTER — Ambulatory Visit: Payer: Medicare HMO | Admitting: Cardiothoracic Surgery

## 2022-01-06 VITALS — BP 97/63 | HR 113 | Resp 18 | Ht 70.0 in | Wt 263.0 lb

## 2022-01-06 DIAGNOSIS — Z09 Encounter for follow-up examination after completed treatment for conditions other than malignant neoplasm: Secondary | ICD-10-CM

## 2022-01-06 DIAGNOSIS — I517 Cardiomegaly: Secondary | ICD-10-CM | POA: Diagnosis not present

## 2022-01-06 DIAGNOSIS — Z951 Presence of aortocoronary bypass graft: Secondary | ICD-10-CM

## 2022-01-06 DIAGNOSIS — J90 Pleural effusion, not elsewhere classified: Secondary | ICD-10-CM | POA: Diagnosis not present

## 2022-01-06 DIAGNOSIS — J9811 Atelectasis: Secondary | ICD-10-CM | POA: Diagnosis not present

## 2022-01-06 HISTORY — DX: Encounter for follow-up examination after completed treatment for conditions other than malignant neoplasm: Z09

## 2022-01-06 NOTE — Progress Notes (Signed)
HPI Patient returns for scheduled postop visit after urgent CABG x3 with combined Maze procedure Nov 12, 2021.  He developed postoperative acute on chronic renal failure requiring dialysis and was discharged with a HD catheter for outpatient dialysis.  His cardiac status has been stable.  He is maintaining sinus rhythm.  He has no recurrent symptoms of angina.  He still has some exercise related shortness of breath due to deconditioning.  He has lost 25 pounds and is starting to make more urine.  He states his nephrologist plan to discontinue dialysis in the near future and will arrange to have the HD catheter removed.  Patient understands he cannot drive and lift up to 15 to 20 pounds but no more. He will need to stay on his low-dose amiodarone and Eliquis for several more months following the maze procedure.  He has appointment to see his cardiologist Dr. Bettina Gavia next week.  Chest x-ray today shows clear lung fields sternal wires intact.  Current Outpatient Medications  Medication Sig Dispense Refill   acetaminophen (TYLENOL) 325 MG tablet Take 2 tablets (650 mg total) by mouth every 6 (six) hours as needed for mild pain.     albuterol (VENTOLIN HFA) 108 (90 Base) MCG/ACT inhaler Inhale 2 puffs into the lungs every 6 (six) hours as needed for wheezing or shortness of breath.     amiodarone (PACERONE) 200 MG tablet Take 1 tablet (200 mg total) by mouth 2 (two) times daily for ten days; then take 1 tablet (200 mg) daily thereafter. 180 tablet 3   apixaban (ELIQUIS) 5 MG TABS tablet Take 1 tablet (5 mg total) by mouth 2 (two) times daily. 180 tablet 1   aspirin EC 81 MG tablet Take 1 tablet (81 mg total) by mouth daily. Swallow whole. 30 tablet 12   atorvastatin (LIPITOR) 80 MG tablet Take 1 tablet (80 mg total) by mouth daily. 90 tablet 3   Dulaglutide (TRULICITY) 3 BJ/4.7WG SOPN Inject 3 mg into the skin every Monday.     fluticasone furoate-vilanterol (BREO ELLIPTA) 200-25 MCG/ACT AEPB Inhale 1  puff into the lungs daily.  3   insulin detemir (LEVEMIR) 100 UNIT/ML FlexPen Inject 30 Units into the skin 2 (two) times daily. 15 mL 11   Insulin Pen Needle 32G X 4 MM MISC Use to inject Levemir 2 (two) times daily. 100 each 1   midodrine (PROAMATINE) 5 MG tablet Take 1 tablet (5 mg total) by mouth 3 (three) times daily with meals. 270 tablet 3   traMADol (ULTRAM) 50 MG tablet Take 1 tablet (50 mg total) by mouth every 12 (twelve) hours as needed for moderate pain. (Patient not taking: Reported on 01/06/2022) 24 tablet 0   No current facility-administered medications for this visit.     Review of Systems: Improved overall strength and exercise tolerance.  Still some pain in his left hip related to a spontaneous psoas hematoma he developed in the hospital which was drained by IR.  Surgical incisions all healing.  Physical Exam Blood pressure 97/63, pulse (!) 113, resp. rate 18, height 5\' 10"  (1.778 m), weight 263 lb (119.3 kg), SpO2 95 %.  Alert and comfortable Lungs clear Sternal leg incisions well-healed No peripheral edema Neuro intact Heart rhythm regular without murmur gallop  Diagnostic Tests: Today's chest x-ray personally reviewed and is clear.  Impression: Doing well 2 months after urgent CABG x3 with combined Maze procedure for atrial fibrillation. Fortunately his postoperative acute on chronic renal failure is significantly improving and  he may be able to stop dialysis in the near future according to his nephrologist.  Plan: I plan on seeing the patient back in 6 weeks to further review his progress and discuss releasing the sternal precautions which she needs to follow until then.  He should not lift more than 20 pounds.   Dahlia Byes, MD [facility] [phone]

## 2022-01-10 DIAGNOSIS — Z992 Dependence on renal dialysis: Secondary | ICD-10-CM | POA: Diagnosis not present

## 2022-01-10 DIAGNOSIS — N179 Acute kidney failure, unspecified: Secondary | ICD-10-CM | POA: Diagnosis not present

## 2022-01-10 DIAGNOSIS — N2581 Secondary hyperparathyroidism of renal origin: Secondary | ICD-10-CM | POA: Diagnosis not present

## 2022-01-13 DIAGNOSIS — Z992 Dependence on renal dialysis: Secondary | ICD-10-CM | POA: Diagnosis not present

## 2022-01-13 DIAGNOSIS — N2581 Secondary hyperparathyroidism of renal origin: Secondary | ICD-10-CM | POA: Diagnosis not present

## 2022-01-13 DIAGNOSIS — N179 Acute kidney failure, unspecified: Secondary | ICD-10-CM | POA: Diagnosis not present

## 2022-01-15 DIAGNOSIS — Z992 Dependence on renal dialysis: Secondary | ICD-10-CM | POA: Diagnosis not present

## 2022-01-15 DIAGNOSIS — N179 Acute kidney failure, unspecified: Secondary | ICD-10-CM | POA: Diagnosis not present

## 2022-01-15 DIAGNOSIS — N2581 Secondary hyperparathyroidism of renal origin: Secondary | ICD-10-CM | POA: Diagnosis not present

## 2022-01-17 ENCOUNTER — Other Ambulatory Visit: Payer: Self-pay

## 2022-01-17 DIAGNOSIS — D649 Anemia, unspecified: Secondary | ICD-10-CM | POA: Diagnosis not present

## 2022-01-17 DIAGNOSIS — Z7984 Long term (current) use of oral hypoglycemic drugs: Secondary | ICD-10-CM | POA: Diagnosis not present

## 2022-01-17 DIAGNOSIS — N179 Acute kidney failure, unspecified: Secondary | ICD-10-CM | POA: Diagnosis not present

## 2022-01-17 DIAGNOSIS — Z7901 Long term (current) use of anticoagulants: Secondary | ICD-10-CM | POA: Diagnosis not present

## 2022-01-17 DIAGNOSIS — N189 Chronic kidney disease, unspecified: Secondary | ICD-10-CM | POA: Insufficient documentation

## 2022-01-17 DIAGNOSIS — E785 Hyperlipidemia, unspecified: Secondary | ICD-10-CM | POA: Diagnosis not present

## 2022-01-17 DIAGNOSIS — Z87891 Personal history of nicotine dependence: Secondary | ICD-10-CM | POA: Diagnosis not present

## 2022-01-17 DIAGNOSIS — Z992 Dependence on renal dialysis: Secondary | ICD-10-CM | POA: Diagnosis not present

## 2022-01-17 DIAGNOSIS — N2581 Secondary hyperparathyroidism of renal origin: Secondary | ICD-10-CM | POA: Diagnosis not present

## 2022-01-17 DIAGNOSIS — E1142 Type 2 diabetes mellitus with diabetic polyneuropathy: Secondary | ICD-10-CM | POA: Diagnosis not present

## 2022-01-17 DIAGNOSIS — I4891 Unspecified atrial fibrillation: Secondary | ICD-10-CM | POA: Diagnosis not present

## 2022-01-17 DIAGNOSIS — I2510 Atherosclerotic heart disease of native coronary artery without angina pectoris: Secondary | ICD-10-CM | POA: Insufficient documentation

## 2022-01-19 NOTE — Progress Notes (Unsigned)
Cardiology Office Note:    Date:  01/20/2022   ID:  [name] [name], DOB 07/17/1955, MRN [id number]  PCP:  Cyndi Bender, PA-C  Cardiologist:  Shirlee More, MD    Referring MD: Cyndi Bender, PA-C    ASSESSMENT:    1. Coronary artery disease of bypass graft of native heart with stable angina pectoris (HCC)   2. Paroxysmal atrial fibrillation ([place])   3. Chronic anticoagulation   4. On amiodarone therapy   5. Stage 3 chronic kidney disease, unspecified whether stage 3a or 3b CKD ([place])   6. Anemia, unspecified type   7. Hypotension, unspecified hypotension type    PLAN:    In order of problems listed above:  Stable CAD having no angina continue treatment including his aspirin beta-blocker and statin Unfortunately despite surgical maze 3 months out from procedure and multiple cardioversions and amiodarone he is in atrial fibrillation today he is very adamant with me that it was not present in the ED although I find no documentation of EKG or heart rhythm we will set him up with a live monitor to be able to assess his heart rhythm increase his amiodarone when asked him to follow-up with EP perhaps this is an opportunity if he remains in A-fib discontinue amiodarone and use dofetilide. Continue his anticoagulant plus clopidogrel he did not have left atrial appendage occlusion. He is due for dialysis Wednesday followed by nephrology Tells me however repeat hemoglobin at the dialysis center on Wednesday I suspect he will require further transfusion Stable continue monitoring   Next appointment: 1 month   Medication Adjustments/Labs and Tests Ordered: Current medicines are reviewed at length with the patient today.  Concerns regarding medicines are outlined above.  Orders Placed This Encounter  Procedures   Ambulatory referral to Cardiac Electrophysiology   LONG TERM MONITOR-LIVE TELEMETRY (3-14 DAYS)   EKG 12-Lead   Meds ordered this encounter  Medications   amiodarone  (PACERONE) 200 MG tablet    Sig: Take 1 tablet (200 mg total) by mouth in the morning, at noon, and at bedtime.    Dispense:  270 tablet    Refill:  3    Chief complaint follow-up with multiple cardiac problems including most recently bypass surgery atrial fibrillation multiple cardioversions renal failure and anemia with ED visit over the weekend   History of Present Illness:    [name] [name] is a 66 y.o. male with a hx of fibrillation hypertensive heart disease with chronic diastolic heart failure CKD and recent CABG 11/12/2021 and multiple cardioversions in the postoperative state last seen postoperatively by Dr. Haroldine Laws.  Postoperatively he had acute on chronic kidney disease and required hemodialysis with renal replacement therapy as well as amiodarone treatment ileus hypotension requiring vasopressors IV and oral midodrine.  He was seen [facility] for anemia and required a transfusion 1 unit of packed cells 01/17/2022 with a hemoglobin of 6.4.  His previous hemoglobin 1 month ago 12/03/2021 7.7.  He had CABG performed 11/12/2021: PROCEDURE:  Procedure(s): CORONARY ARTERY BYPASS GRAFTING (CABG) x  3 USING LEFT INTERNAL MAMMARY ARTERY AND RIGHT GREATER SAPHENOUS VEIN (N/A) MAZE (N/A) TRANSESOPHAGEAL ECHOCARDIOGRAM (TEE) (N/A) ENDOVEIN HARVEST OF GREATER SAPHENOUS VEIN Compliance with diet, lifestyle and medications: Yes  Echo 11/21/2021 TEE:  1. Left ventricular ejection fraction, by estimation, is 55 to 60%. The  left ventricle has normal function. The left ventricle has no regional  wall motion abnormalities but there is septal bounce suggestive of prior  cardiac surgery.   2. Right  ventricular systolic function is mildly reduced. The right  ventricular size is normal. Tricuspid regurgitation signal is inadequate  for assessing PA pressure.   3. Left atrial size was mildly dilated. No left atrial/left atrial  appendage thrombus was detected.   4. There is a PFO by color doppler and  bubble study.   5. The mitral valve is normal in structure. Mild mitral valve  regurgitation. No evidence of mitral stenosis.   6. The aortic valve is tricuspid. There is mild calcification of the  aortic valve. Aortic valve regurgitation is not visualized. No aortic  stenosis is present.   7. A small pericardial effusion is present, primarily towards the apex.   Today he is in atrial fibrillation rate controlled.  Although not present in the record continued CHF him he tells me an EKG was done he was told his heart rhythm was normal He has no awareness of atrial fibrillation he feels better after transfusion but still quite weak short of breath with more than usual activity no edema orthopnea chest pain or syncope. He takes low-dose aspirin and his current anticoagulant He will undergo dialysis Wednesday. Past Medical History:  Diagnosis Date   Atherosclerotic heart disease    Benign essential HTN 07/30/2014   Bradycardia 01/25/2015   CHF (congestive heart failure) (HCC)    Chronic anticoagulation 76/28/3151   Chronic systolic CHF (congestive heart failure), NYHA class 3 ([place]) 08/30/2014   Overview:  Global ef 30%   CKD (chronic kidney disease)    Class 3 severe obesity in adult [facility]) 04/28/2017   Coronary artery disease    Diabetes mellitus without complication ([place])    Diabetic neuropathy ([place]) 07/28/2014   Dysrhythmia    Erectile dysfunction    High risk medication use 05/26/2018   Hypertensive heart disease with heart failure ([place]) 07/30/2014   Leukocytosis 11/26/2021   Lumbago    LV dysfunction 04/28/2017   Mixed hyperlipidemia 07/28/2014   OSA (obstructive sleep apnea)    CPAP   Paroxysmal atrial fibrillation ([place]) 07/28/2014   Postop check 01/06/2022   S/P CABG x 3 11/12/2021   Sinus node dysfunction ([place]) 04/21/2016   Testicular hypofunction    Uncontrolled type 2 diabetes mellitus with microalbuminuric diabetic nephropathy 07/28/2014    Past Surgical History:   Procedure Laterality Date   BUBBLE STUDY  11/21/2021   Procedure: BUBBLE STUDY;  Surgeon: Larey Dresser, MD;  Location: [place];  Service: Cardiovascular;;   CARDIOVERSION N/A 11/21/2021   Procedure: CARDIOVERSION;  Surgeon: Larey Dresser, MD;  Location: [facility] ENDOSCOPY;  Service: Cardiovascular;  Laterality: N/A;   CARDIOVERSION N/A 11/25/2021   Procedure: CARDIOVERSION;  Surgeon: Larey Dresser, MD;  Location: [facility] ENDOSCOPY;  Service: Cardiovascular;  Laterality: N/A;   CARDIOVERSION N/A 11/29/2021   Procedure: CARDIOVERSION;  Surgeon: Larey Dresser, MD;  Location: [facility] ENDOSCOPY;  Service: Cardiovascular;  Laterality: N/A;   CORONARY ARTERY BYPASS GRAFT N/A 11/12/2021   Procedure: CORONARY ARTERY BYPASS GRAFTING (CABG) x  3 USING LEFT INTERNAL MAMMARY ARTERY AND RIGHT GREATER SAPHENOUS VEIN;  Surgeon: Dahlia Byes, MD;  Location: [place];  Service: Open Heart Surgery;  Laterality: N/A;   ELECTROPHYSIOLOGIC STUDY N/A 01/18/2015   Procedure: CARDIOVERSION;  Surgeon: [name], MD;  Location: [facility] ORS;  Service: Cardiovascular;  Laterality: N/A;   ENDOVEIN HARVEST OF GREATER SAPHENOUS VEIN  11/12/2021   Procedure: ENDOVEIN HARVEST OF GREATER SAPHENOUS VEIN;  Surgeon: Dahlia Byes, MD;  Location: [place];  Service: Open Heart Surgery;;  EYE SURGERY Bilateral 2022   IR FLUORO GUIDE CV LINE RIGHT  11/28/2021   IR US GUIDE VASC ACCESS RIGHT  11/28/2021   LEFT HEART CATH AND CORONARY ANGIOGRAPHY N/A 10/24/2021   Procedure: LEFT HEART CATH AND CORONARY ANGIOGRAPHY;  Surgeon: Jettie Booze, MD;  Location: [place] CV LAB;  Service: Cardiovascular;  Laterality: N/A;   MAZE N/A 11/12/2021   Procedure: MAZE;  Surgeon: Dahlia Byes, MD;  Location: [place];  Service: Open Heart Surgery;  Laterality: N/A;   TEE WITHOUT CARDIOVERSION N/A 11/12/2021   Procedure: TRANSESOPHAGEAL ECHOCARDIOGRAM (TEE);  Surgeon: Dahlia Byes, MD;  Location: [place];  Service: Open Heart Surgery;  Laterality:  N/A;   TEE WITHOUT CARDIOVERSION N/A 11/21/2021   Procedure: TRANSESOPHAGEAL ECHOCARDIOGRAM (TEE);  Surgeon: Larey Dresser, MD;  Location[facility] ENDOSCOPY;  Service: Cardiovascular;  Laterality: N/A;    Current Medications: Current Meds  Medication Sig   amiodarone (PACERONE) 200 MG tablet Take 1 tablet (200 mg total) by mouth in the morning, at noon, and at bedtime.   apixaban (ELIQUIS) 5 MG TABS tablet Take 1 tablet (5 mg total) by mouth 2 (two) times daily.   aspirin EC 81 MG tablet Take 1 tablet (81 mg total) by mouth daily. Swallow whole.   atorvastatin (LIPITOR) 80 MG tablet Take 1 tablet (80 mg total) by mouth daily.   B Complex-C-Folic Acid (DIALYVITE 076) 0.8 MG TABS Take 1 tablet by mouth daily.   Dulaglutide (TRULICITY) 3 AU/6.3FH SOPN Inject 3 mg into the skin every Monday.   fluticasone furoate-vilanterol (BREO ELLIPTA) 200-25 MCG/ACT AEPB Inhale 1 puff into the lungs daily.   insulin detemir (LEVEMIR) 100 UNIT/ML FlexPen Inject 30 Units into the skin 2 (two) times daily.   Insulin Pen Needle 32G X 4 MM MISC Use to inject Levemir 2 (two) times daily.   losartan (COZAAR) 50 MG tablet Take 50 mg by mouth daily.   metoprolol succinate (TOPROL-XL) 25 MG 24 hr tablet Take 25 mg by mouth daily.   midodrine (PROAMATINE) 5 MG tablet Take 1 tablet (5 mg total) by mouth 3 (three) times daily with meals.   [DISCONTINUED] amiodarone (PACERONE) 200 MG tablet Take 1 tablet (200 mg total) by mouth 2 (two) times daily for ten days; then take 1 tablet (200 mg) daily thereafter.     Allergies:   Patient has no known allergies.   Social History   Socioeconomic History   Marital status: Married    Spouse name: Not on file   Number of children: Not on file   Years of education: Not on file   Highest education level: Not on file  Occupational History   Not on file  Tobacco Use   Smoking status: Former    Types: Cigarettes    Quit date: 2007    Years since quitting: 16.5   Smokeless  tobacco: Never  Vaping Use   Vaping Use: Never used  Substance and Sexual Activity   Alcohol use: No   Drug use: Yes    Frequency: 1.0 times per week    Types: Marijuana   Sexual activity: Not on file  Other Topics Concern   Not on file  Social History Narrative   Not on file   Social Determinants of Health   Financial Resource Strain: Not on file  Food Insecurity: Not on file  Transportation Needs: Not on file  Physical Activity: Not on file  Stress: Not on file  Social Connections: Not on file  Family History: The patient's family history includes CAD in his brother and mother; Drug abuse in his brother; Lung cancer in his father. ROS:   Please see the history of present illness.    All other systems reviewed and are negative.  EKGs/Labs/Other Studies Reviewed:    The following studies were reviewed today: 10/24/2021: Procedures LEFT HEART CATH AND CORONARY ANGIOGRAPHY   Conclusion   Ost LM to Mid LM lesion is 50% stenosed.   Dist LM lesion is 75% stenosed.   Ost LAD to Prox LAD lesion is 50% stenosed.  Ectatic segment with eccentric calcium.   Ost Cx to Prox Cx lesion is 70% stenosed.   Mid LAD lesion is 50% stenosed.   Prox RCA to Mid RCA lesion is 70% stenosed.   LV end diastolic pressure is normal.   There is no aortic valve stenosis.   Eccentric, severely calcified left main disease extending into the ostial LAD.  There is also ostial disease and a relatively small circumflex vessel.  Diffuse, calcific disease in the mid RCA today.   Patient was initially scheduled for A-fib ablation.  Plan now for cardiac surgery consult with consideration of CABG, maze and left atrial appendage clip.  EKG:  EKG ordered today and personally reviewed.  The ekg ordered today demonstrates atrial fibrillation controlled ventricular rate left bundle branch block morphology  Recent Labs: 11/18/2021: ALT 9 12/03/2021: Hemoglobin 7.7; Platelets 320 12/04/2021: BUN 69;  Creatinine, Ser 6.88; Magnesium 2.1; Potassium 3.6; Sodium 134  Recent Lipid Panel No results found for: "CHOL", "TRIG", "HDL", "CHOLHDL", "VLDL", "LDLCALC", "LDLDIRECT"  Physical Exam:    VS:  BP 118/60   Pulse 99   Ht 5\' 10"  (1.778 m)   Wt 269 lb 6.4 oz (122.2 kg)   SpO2 95%   BMI 38.65 kg/m     Wt Readings from Last 3 Encounters:  01/20/22 269 lb 6.4 oz (122.2 kg)  01/06/22 263 lb (119.3 kg)  12/04/21 266 lb 8.6 oz (120.9 kg)     GEN:  Well nourished, well developed in no acute distress HEENT: Normal NECK: No JVD; No carotid bruits LYMPHATICS: No lymphadenopathy CARDIAC: Irregular rate and rhythm  RESPIRATORY:  Clear to auscultation without rales, wheezing or rhonchi  ABDOMEN: Soft, non-tender, non-distended MUSCULOSKELETAL:  No edema; No deformity  SKIN: Warm and dry NEUROLOGIC:  Alert and oriented x 3 PSYCHIATRIC:  Normal affect    Signed, Shirlee More, MD  01/20/2022 12:31 PM    [facility]

## 2022-01-20 ENCOUNTER — Encounter: Payer: Self-pay | Admitting: Cardiology

## 2022-01-20 ENCOUNTER — Ambulatory Visit (INDEPENDENT_AMBULATORY_CARE_PROVIDER_SITE_OTHER): Payer: Medicare HMO

## 2022-01-20 ENCOUNTER — Ambulatory Visit (INDEPENDENT_AMBULATORY_CARE_PROVIDER_SITE_OTHER): Payer: Medicare HMO | Admitting: Cardiology

## 2022-01-20 ENCOUNTER — Telehealth: Payer: Self-pay | Admitting: Cardiology

## 2022-01-20 VITALS — BP 118/60 | HR 99 | Ht 70.0 in | Wt 269.4 lb

## 2022-01-20 DIAGNOSIS — Z79899 Other long term (current) drug therapy: Secondary | ICD-10-CM

## 2022-01-20 DIAGNOSIS — I25708 Atherosclerosis of coronary artery bypass graft(s), unspecified, with other forms of angina pectoris: Secondary | ICD-10-CM

## 2022-01-20 DIAGNOSIS — I480 Paroxysmal atrial fibrillation: Secondary | ICD-10-CM

## 2022-01-20 DIAGNOSIS — N183 Chronic kidney disease, stage 3 unspecified: Secondary | ICD-10-CM

## 2022-01-20 DIAGNOSIS — D649 Anemia, unspecified: Secondary | ICD-10-CM

## 2022-01-20 DIAGNOSIS — Z7901 Long term (current) use of anticoagulants: Secondary | ICD-10-CM

## 2022-01-20 DIAGNOSIS — I959 Hypotension, unspecified: Secondary | ICD-10-CM

## 2022-01-20 MED ORDER — AMIODARONE HCL 200 MG PO TABS: 200.0000 mg | ORAL_TABLET | Freq: Three times a day (TID) | ORAL | 3 refills | Status: DC

## 2022-01-20 NOTE — Telephone Encounter (Signed)
Calling to report abnormal readings.

## 2022-01-20 NOTE — Telephone Encounter (Signed)
First recording of A-flutter @86 -102bpm, avg 92bpm, lasting 90sec. Event occured today at "about noon" per [facility] with [employer]. States he reached out to patient, who denies any symptoms at time of event and condones he was eating lunch.

## 2022-01-20 NOTE — Patient Instructions (Signed)
Medication Instructions:  Your physician has recommended you make the following change in your medication:   START: Amiodarone 200 mg three times daily  *If you need a refill on your cardiac medications before your next appointment, please call your pharmacy*   Lab Work: None If you have labs (blood work) drawn today and your tests are completely normal, you will receive your results only by: [place] (if you have MyChart) OR A paper copy in the mail If you have any lab test that is abnormal or we need to change your treatment, we will call you to review the results.   Testing/Procedures: A zio monitor was ordered today. It will remain on for 14 days. You will then return monitor and event diary in provided box. It takes 1-2 weeks for report to be downloaded and returned to [place]. We will call you with the results. If monitor falls off or has orange flashing light, please call [employer] for further instructions.     Follow-Up: At [facility], you and your health needs are our priority.  As part of our continuing mission to provide you with exceptional heart care, we have created designated Provider Care Teams.  These Care Teams include your primary Cardiologist (physician) and Advanced Practice Providers (APPs -  Physician Assistants and Nurse Practitioners) who all work together to provide you with the care you need, when you need it.  We recommend signing up for the patient portal called "MyChart".  Sign up information is provided on this After Visit Summary.  MyChart is used to connect with patients for Virtual Visits (Telemedicine).  Patients are able to view lab/test results, encounter notes, upcoming appointments, etc.  Non-urgent messages can be sent to your provider as well.   To learn more about what you can do with MyChart, go to [email or web address].    Your next appointment:   1 month(s)  The format for your next appointment:   In Person  Provider:   Shirlee More, MD     Other Instructions None  Important Information About Sugar

## 2022-01-20 NOTE — Telephone Encounter (Signed)
   Cardiac Monitor Alert  Date of alert:  01/20/2022   Patient Name: [name] [name]  DOB: 08-24-1955  MRN: [id number]   [facility] Cardiologist: None  [facility] EP:  Shirlee More, MD   Monitor Information: Long Term Monitor-Live Telemetry [ZioAT]  Reason:  PMH A-flutter / PAF  Ordering provider:  Shirlee More, MD   Alert Atrial Fibrillation/Flutter This is the 1st and 2nd alert for this rhythm.  The patient has a hx of Atrial Fibrillation/Flutter.    Anticoagulation medication as of 01/20/2022           apixaban (ELIQUIS) 5 MG TABS tablet Take 1 tablet (5 mg total) by mouth 2 (two) times daily.       Next Cardiology Appointment   Date:  02/18/2022  Provider:  Lollie Marrow PA-C  The patient was contacted today.  He is asymptomatic. Pt 01/20/2022 1246 pm Zio rhythm and 01/20/2022 211 pm Zio rhythm taken to Dr Linard Millers, DOD for review.  Dr Tamala Julian made aware of pt on Eliquis, and new Amiodarone script.  Dr Tamala Julian advised pt called, asymptomatic, recent ablation 10/25/21, and was not aware of cardiac monitor event.  Pt at pharmacy picking up prescription.  Dr Tamala Julian advised notify Dr Bettina Gavia of A-flutter, and recommends f/u as ordered.      Other: Will staff message provider an RN to 2 events captured.    Varney Daily, RN  01/20/2022 3:01 PM

## 2022-01-21 DIAGNOSIS — I480 Paroxysmal atrial fibrillation: Secondary | ICD-10-CM | POA: Diagnosis not present

## 2022-01-21 DIAGNOSIS — I25708 Atherosclerosis of coronary artery bypass graft(s), unspecified, with other forms of angina pectoris: Secondary | ICD-10-CM | POA: Diagnosis not present

## 2022-01-22 DIAGNOSIS — N179 Acute kidney failure, unspecified: Secondary | ICD-10-CM | POA: Diagnosis not present

## 2022-01-22 DIAGNOSIS — Z992 Dependence on renal dialysis: Secondary | ICD-10-CM | POA: Diagnosis not present

## 2022-01-22 DIAGNOSIS — N2581 Secondary hyperparathyroidism of renal origin: Secondary | ICD-10-CM | POA: Diagnosis not present

## 2022-01-23 ENCOUNTER — Other Ambulatory Visit: Payer: Self-pay

## 2022-01-23 ENCOUNTER — Telehealth: Payer: Self-pay | Admitting: Cardiology

## 2022-01-23 NOTE — Telephone Encounter (Signed)
Dr. Bettina Gavia was made aware of the [employer] zio alert and he does not want to make any changes at this time.

## 2022-01-23 NOTE — Telephone Encounter (Signed)
Spoke with pharmacist, this has been clarified by Dr. Bettina Gavia.

## 2022-01-23 NOTE — Telephone Encounter (Signed)
Pt c/o medication issue:  1. Name of Medication: amiodarone (PACERONE) 200 MG tablet  2. How are you currently taking this medication (dosage and times per day)?   3. Are you having a reaction (difficulty breathing--STAT)?   4. What is your medication issue? Pharmacy is requesting call back to get clarification on how this medication is suppose to be taken. She states this is an unusual dosage for 3 x daily. Requesting call back.

## 2022-01-24 DIAGNOSIS — N179 Acute kidney failure, unspecified: Secondary | ICD-10-CM | POA: Diagnosis not present

## 2022-01-24 DIAGNOSIS — Z992 Dependence on renal dialysis: Secondary | ICD-10-CM | POA: Diagnosis not present

## 2022-01-24 DIAGNOSIS — N2581 Secondary hyperparathyroidism of renal origin: Secondary | ICD-10-CM | POA: Diagnosis not present

## 2022-01-27 DIAGNOSIS — Z992 Dependence on renal dialysis: Secondary | ICD-10-CM | POA: Diagnosis not present

## 2022-01-27 DIAGNOSIS — N2581 Secondary hyperparathyroidism of renal origin: Secondary | ICD-10-CM | POA: Diagnosis not present

## 2022-01-27 DIAGNOSIS — N179 Acute kidney failure, unspecified: Secondary | ICD-10-CM | POA: Diagnosis not present

## 2022-01-28 DIAGNOSIS — R110 Nausea: Secondary | ICD-10-CM | POA: Diagnosis not present

## 2022-01-28 DIAGNOSIS — I5020 Unspecified systolic (congestive) heart failure: Secondary | ICD-10-CM | POA: Diagnosis not present

## 2022-01-28 DIAGNOSIS — N186 End stage renal disease: Secondary | ICD-10-CM | POA: Diagnosis not present

## 2022-01-28 DIAGNOSIS — Z1331 Encounter for screening for depression: Secondary | ICD-10-CM | POA: Diagnosis not present

## 2022-01-28 DIAGNOSIS — I2510 Atherosclerotic heart disease of native coronary artery without angina pectoris: Secondary | ICD-10-CM | POA: Diagnosis not present

## 2022-01-28 DIAGNOSIS — E1140 Type 2 diabetes mellitus with diabetic neuropathy, unspecified: Secondary | ICD-10-CM | POA: Diagnosis not present

## 2022-01-28 DIAGNOSIS — E782 Mixed hyperlipidemia: Secondary | ICD-10-CM | POA: Diagnosis not present

## 2022-01-28 DIAGNOSIS — Z992 Dependence on renal dialysis: Secondary | ICD-10-CM | POA: Diagnosis not present

## 2022-01-28 DIAGNOSIS — I4891 Unspecified atrial fibrillation: Secondary | ICD-10-CM | POA: Diagnosis not present

## 2022-01-29 DIAGNOSIS — N2581 Secondary hyperparathyroidism of renal origin: Secondary | ICD-10-CM | POA: Diagnosis not present

## 2022-01-29 DIAGNOSIS — Z992 Dependence on renal dialysis: Secondary | ICD-10-CM | POA: Diagnosis not present

## 2022-01-29 DIAGNOSIS — N179 Acute kidney failure, unspecified: Secondary | ICD-10-CM | POA: Diagnosis not present

## 2022-01-31 DIAGNOSIS — N179 Acute kidney failure, unspecified: Secondary | ICD-10-CM | POA: Diagnosis not present

## 2022-01-31 DIAGNOSIS — N2581 Secondary hyperparathyroidism of renal origin: Secondary | ICD-10-CM | POA: Diagnosis not present

## 2022-01-31 DIAGNOSIS — Z992 Dependence on renal dialysis: Secondary | ICD-10-CM | POA: Diagnosis not present

## 2022-02-03 DIAGNOSIS — N2581 Secondary hyperparathyroidism of renal origin: Secondary | ICD-10-CM | POA: Diagnosis not present

## 2022-02-03 DIAGNOSIS — N179 Acute kidney failure, unspecified: Secondary | ICD-10-CM | POA: Diagnosis not present

## 2022-02-03 DIAGNOSIS — Z992 Dependence on renal dialysis: Secondary | ICD-10-CM | POA: Diagnosis not present

## 2022-02-05 DIAGNOSIS — N2581 Secondary hyperparathyroidism of renal origin: Secondary | ICD-10-CM | POA: Diagnosis not present

## 2022-02-05 DIAGNOSIS — N179 Acute kidney failure, unspecified: Secondary | ICD-10-CM | POA: Diagnosis not present

## 2022-02-05 DIAGNOSIS — Z992 Dependence on renal dialysis: Secondary | ICD-10-CM | POA: Diagnosis not present

## 2022-02-07 DIAGNOSIS — Z6837 Body mass index (BMI) 37.0-37.9, adult: Secondary | ICD-10-CM | POA: Diagnosis not present

## 2022-02-07 DIAGNOSIS — N179 Acute kidney failure, unspecified: Secondary | ICD-10-CM | POA: Diagnosis not present

## 2022-02-07 DIAGNOSIS — Z992 Dependence on renal dialysis: Secondary | ICD-10-CM | POA: Diagnosis not present

## 2022-02-07 DIAGNOSIS — N2581 Secondary hyperparathyroidism of renal origin: Secondary | ICD-10-CM | POA: Diagnosis not present

## 2022-02-07 DIAGNOSIS — Z9181 History of falling: Secondary | ICD-10-CM | POA: Diagnosis not present

## 2022-02-07 DIAGNOSIS — I4891 Unspecified atrial fibrillation: Secondary | ICD-10-CM | POA: Diagnosis not present

## 2022-02-07 DIAGNOSIS — E1140 Type 2 diabetes mellitus with diabetic neuropathy, unspecified: Secondary | ICD-10-CM | POA: Diagnosis not present

## 2022-02-10 NOTE — Progress Notes (Addendum)
PCP:  Cyndi Bender, PA-C Primary Cardiologist: None Electrophysiologist: [name], MD   [name] [name] is a 66 y.o. male seen today for [name], MD for routine electrophysiology followup.    In the course of work up for AF ablation, he was found to have severe CAD.  Underwent CABG and combined MAZE 11/12/21. Has continued to have issues with AF/flutter, as well as anemia.   Zio by Dr. Bettina Gavia demonstrated persistent atrial flutter.   Pt is overall doing ok.  He had malaise on higher doses of amiodarone so has been taking 200 mg BID. He has SOB with moderate or more exertion. He has an HD cath in his R chest.  No chest pain, syncope, nausea, vomiting, lightheadednessr , or dizziness   Past Medical History:  Diagnosis Date   Atherosclerotic heart disease    Benign essential HTN 07/30/2014   Bradycardia 01/25/2015   CHF (congestive heart failure) (HCC)    Chronic anticoagulation 19/62/2297   Chronic systolic CHF (congestive heart failure), NYHA class 3 ([place]) 08/30/2014   Overview:  Global ef 30%   CKD (chronic kidney disease)    Class 3 severe obesity in adult [facility]) 04/28/2017   Coronary artery disease    Diabetes mellitus without complication ([place])    Diabetic neuropathy ([place]) 07/28/2014   Dysrhythmia    Erectile dysfunction    High risk medication use 05/26/2018   Hypertensive heart disease with heart failure ([place]) 07/30/2014   Leukocytosis 11/26/2021   Lumbago    LV dysfunction 04/28/2017   Mixed hyperlipidemia 07/28/2014   OSA (obstructive sleep apnea)    CPAP   Paroxysmal atrial fibrillation ([place]) 07/28/2014   Postop check 01/06/2022   S/P CABG x 3 11/12/2021   Sinus node dysfunction ([place]) 04/21/2016   Testicular hypofunction    Uncontrolled type 2 diabetes mellitus with microalbuminuric diabetic nephropathy 07/28/2014   Past Surgical History:  Procedure Laterality Date   BUBBLE STUDY  11/21/2021   Procedure: BUBBLE STUDY;  Surgeon: Larey Dresser, MD;  Location: [place];  Service: Cardiovascular;;   CARDIOVERSION N/A 11/21/2021   Procedure: CARDIOVERSION;  Surgeon: Larey Dresser, MD;  Location: [facility] ENDOSCOPY;  Service: Cardiovascular;  Laterality: N/A;   CARDIOVERSION N/A 11/25/2021   Procedure: CARDIOVERSION;  Surgeon: Larey Dresser, MD;  Location: [facility] ENDOSCOPY;  Service: Cardiovascular;  Laterality: N/A;   CARDIOVERSION N/A 11/29/2021   Procedure: CARDIOVERSION;  Surgeon: Larey Dresser, MD;  Location: [facility] ENDOSCOPY;  Service: Cardiovascular;  Laterality: N/A;   CORONARY ARTERY BYPASS GRAFT N/A 11/12/2021   Procedure: CORONARY ARTERY BYPASS GRAFTING (CABG) x  3 USING LEFT INTERNAL MAMMARY ARTERY AND RIGHT GREATER SAPHENOUS VEIN;  Surgeon: Dahlia Byes, MD;  Location: [place];  Service: Open Heart Surgery;  Laterality: N/A;   ELECTROPHYSIOLOGIC STUDY N/A 01/18/2015   Procedure: CARDIOVERSION;  Surgeon: [name], MD;  Location: [facility] ORS;  Service: Cardiovascular;  Laterality: N/A;   ENDOVEIN HARVEST OF GREATER SAPHENOUS VEIN  11/12/2021   Procedure: ENDOVEIN HARVEST OF GREATER SAPHENOUS VEIN;  Surgeon: Dahlia Byes, MD;  Location: [place];  Service: Open Heart Surgery;;   EYE SURGERY Bilateral 2022   IR FLUORO GUIDE CV LINE RIGHT  11/28/2021   IR US GUIDE VASC ACCESS RIGHT  11/28/2021   LEFT HEART CATH AND CORONARY ANGIOGRAPHY N/A 10/24/2021   Procedure: LEFT HEART CATH AND CORONARY ANGIOGRAPHY;  Surgeon: Jettie Booze, MD;  Location: [place] CV LAB;  Service: Cardiovascular;  Laterality: N/A;  MAZE N/A 11/12/2021   Procedure: MAZE;  Surgeon: Dahlia Byes, MD;  Location: [place];  Service: Open Heart Surgery;  Laterality: N/A;   TEE WITHOUT CARDIOVERSION N/A 11/12/2021   Procedure: TRANSESOPHAGEAL ECHOCARDIOGRAM (TEE);  Surgeon: Dahlia Byes, MD;  Location: [place];  Service: Open Heart Surgery;  Laterality: N/A;   TEE WITHOUT CARDIOVERSION N/A 11/21/2021   Procedure: TRANSESOPHAGEAL ECHOCARDIOGRAM (TEE);  Surgeon:  Larey Dresser, MD;  Location: [facility] ENDOSCOPY;  Service: Cardiovascular;  Laterality: N/A;    Current Outpatient Medications  Medication Sig Dispense Refill   albuterol (VENTOLIN HFA) 108 (90 Base) MCG/ACT inhaler Inhale 2 puffs into the lungs every 6 (six) hours as needed for wheezing or shortness of breath.     amiodarone (PACERONE) 200 MG tablet Take 1 tablet (200 mg total) by mouth in the morning, at noon, and at bedtime. 270 tablet 3   apixaban (ELIQUIS) 5 MG TABS tablet Take 1 tablet (5 mg total) by mouth 2 (two) times daily. 180 tablet 1   aspirin EC 81 MG tablet Take 1 tablet (81 mg total) by mouth daily. Swallow whole. 30 tablet 12   atorvastatin (LIPITOR) 80 MG tablet Take 1 tablet (80 mg total) by mouth daily. 90 tablet 3   B Complex-C-Folic Acid (DIALYVITE 038) 0.8 MG TABS Take 1 tablet by mouth daily.     Dulaglutide (TRULICITY) 3 UE/2.8MK SOPN Inject 3 mg into the skin every Monday.     fluticasone furoate-vilanterol (BREO ELLIPTA) 200-25 MCG/ACT AEPB Inhale 1 puff into the lungs daily.  3   insulin detemir (LEVEMIR) 100 UNIT/ML FlexPen Inject 30 Units into the skin 2 (two) times daily. (Patient taking differently: Inject 35 Units into the skin 2 (two) times daily.) 15 mL 11   Insulin Pen Needle 32G X 4 MM MISC Use to inject Levemir 2 (two) times daily. 100 each 1   metoprolol succinate (TOPROL-XL) 25 MG 24 hr tablet Take 25 mg by mouth daily.     midodrine (PROAMATINE) 5 MG tablet Take 1 tablet (5 mg total) by mouth 3 (three) times daily with meals. 270 tablet 3   torsemide (DEMADEX) 20 MG tablet Take 20 mg by mouth daily.     acetaminophen (TYLENOL) 325 MG tablet Take 2 tablets (650 mg total) by mouth every 6 (six) hours as needed for mild pain. (Patient not taking: Reported on 01/20/2022)     losartan (COZAAR) 50 MG tablet Take 50 mg by mouth daily. (Patient not taking: Reported on 02/18/2022)     pantoprazole (PROTONIX) 40 MG tablet Take 40 mg by mouth daily. (Patient not taking:  Reported on 02/18/2022)     traMADol (ULTRAM) 50 MG tablet Take 1 tablet (50 mg total) by mouth every 12 (twelve) hours as needed for moderate pain. (Patient not taking: Reported on 01/20/2022) 24 tablet 0   No current facility-administered medications for this visit.    No Known Allergies  Social History   Socioeconomic History   Marital status: Married    Spouse name: Not on file   Number of children: Not on file   Years of education: Not on file   Highest education level: Not on file  Occupational History   Not on file  Tobacco Use   Smoking status: Former    Types: Cigarettes    Quit date: 2007    Years since quitting: 16.6   Smokeless tobacco: Never  Vaping Use   Vaping Use: Never used  Substance and Sexual Activity  Alcohol use: No   Drug use: Yes    Frequency: 1.0 times per week    Types: Marijuana   Sexual activity: Not on file  Other Topics Concern   Not on file  Social History Narrative   Not on file   Social Determinants of Health   Financial Resource Strain: Not on file  Food Insecurity: Not on file  Transportation Needs: Not on file  Physical Activity: Not on file  Stress: Not on file  Social Connections: Not on file  Intimate Partner Violence: Not on file    Review of Systems: All other systems reviewed and are otherwise negative except as noted above.  Physical Exam: Vitals:   02/18/22 1040  BP: 126/70  Pulse: (!) 109  SpO2: 94%  Weight: 258 lb 6.4 oz (117.2 kg)  Height: 5\' 10"  (1.778 m)    GEN- The patient is well appearing, alert and oriented x 3 today.   HEENT: normocephalic, atraumatic; sclera clear, conjunctiva pink; hearing intact; oropharynx clear; neck supple, no JVP Lymph- no cervical lymphadenopathy Lungs- Clear to ausculation bilaterally, normal work of breathing.  No wheezes, rales, rhonchi Heart- Irregularly irregular rate and rhythm, no murmurs, rubs or gallops, PMI not laterally displaced GI- Obese, soft, non-tender,  non-distended, bowel sounds present, no hepatosplenomegaly Extremities- no clubbing or cyanosis. 1+ bilateral edema ; DP/PT/radial pulses 2+ bilaterally MS- no significant deformity or atrophy Skin- warm and dry, no rash or lesion Psych- euthymic mood, full affect Neuro- strength and sensation are intact  EKG is ordered. Personal review of EKG from today shows AFL vs coarse fib at 109 bpm  Additional studies reviewed include: Previous EP office notes.   Assessment and Plan: 1. Persistent atrial fibrillation Continue Xarelto 20 mg daily S/p MAZE procedure  Continue amiodarone at 200 mg BID.  He is not a candidate for tikosyn currently given ESRD. Would also require long wash out of amiodarone.  Remains out of rhythm. Will plan for cardioversion, and then follow up with Dr. Curt Bears to discuss best options.  Discussed risks, benefits, and alternatives to cardioversion today.   2. OSA Encouraged nightly CPAP  3. HTN Stable on current regimen   4. CAD s/p CABG 10/2021 Following with Dr. Darcey Nora  5. ESRD Currently M/W/F through an HD cath.  There is a change he may be able to come off dialysis.  Last Cr in system 2.10  Follow up with Dr. Curt Bears in  6-8 weeks post [place], PA-C  02/18/22 10:46 AM

## 2022-02-10 NOTE — H&P (View-Only) (Signed)
PCP:  Cyndi Bender, PA-C Primary Cardiologist: None Electrophysiologist: [name], MD   [name] [name] is a 66 y.o. male seen today for [name], MD for routine electrophysiology followup.    In the course of work up for AF ablation, he was found to have severe CAD.  Underwent CABG and combined MAZE 11/12/21. Has continued to have issues with AF/flutter, as well as anemia.   Zio by Dr. Bettina Gavia demonstrated persistent atrial flutter.   Pt is overall doing ok.  He had malaise on higher doses of amiodarone so has been taking 200 mg BID. He has SOB with moderate or more exertion. He has an HD cath in his R chest.  No chest pain, syncope, nausea, vomiting, lightheadednessr , or dizziness   Past Medical History:  Diagnosis Date   Atherosclerotic heart disease    Benign essential HTN 07/30/2014   Bradycardia 01/25/2015   CHF (congestive heart failure) (HCC)    Chronic anticoagulation 03/54/6568   Chronic systolic CHF (congestive heart failure), NYHA class 3 ([place]) 08/30/2014   Overview:  Global ef 30%   CKD (chronic kidney disease)    Class 3 severe obesity in adult [facility]) 04/28/2017   Coronary artery disease    Diabetes mellitus without complication ([place])    Diabetic neuropathy ([place]) 07/28/2014   Dysrhythmia    Erectile dysfunction    High risk medication use 05/26/2018   Hypertensive heart disease with heart failure ([place]) 07/30/2014   Leukocytosis 11/26/2021   Lumbago    LV dysfunction 04/28/2017   Mixed hyperlipidemia 07/28/2014   OSA (obstructive sleep apnea)    CPAP   Paroxysmal atrial fibrillation ([place]) 07/28/2014   Postop check 01/06/2022   S/P CABG x 3 11/12/2021   Sinus node dysfunction ([place]) 04/21/2016   Testicular hypofunction    Uncontrolled type 2 diabetes mellitus with microalbuminuric diabetic nephropathy 07/28/2014   Past Surgical History:  Procedure Laterality Date   BUBBLE STUDY  11/21/2021   Procedure: BUBBLE STUDY;  Surgeon: Larey Dresser, MD;  Location: [place];  Service: Cardiovascular;;   CARDIOVERSION N/A 11/21/2021   Procedure: CARDIOVERSION;  Surgeon: Larey Dresser, MD;  Location: [facility] ENDOSCOPY;  Service: Cardiovascular;  Laterality: N/A;   CARDIOVERSION N/A 11/25/2021   Procedure: CARDIOVERSION;  Surgeon: Larey Dresser, MD;  Location: [facility] ENDOSCOPY;  Service: Cardiovascular;  Laterality: N/A;   CARDIOVERSION N/A 11/29/2021   Procedure: CARDIOVERSION;  Surgeon: Larey Dresser, MD;  Location: [facility] ENDOSCOPY;  Service: Cardiovascular;  Laterality: N/A;   CORONARY ARTERY BYPASS GRAFT N/A 11/12/2021   Procedure: CORONARY ARTERY BYPASS GRAFTING (CABG) x  3 USING LEFT INTERNAL MAMMARY ARTERY AND RIGHT GREATER SAPHENOUS VEIN;  Surgeon: Dahlia Byes, MD;  Location: [place];  Service: Open Heart Surgery;  Laterality: N/A;   ELECTROPHYSIOLOGIC STUDY N/A 01/18/2015   Procedure: CARDIOVERSION;  Surgeon: [name], MD;  Location: [facility] ORS;  Service: Cardiovascular;  Laterality: N/A;   ENDOVEIN HARVEST OF GREATER SAPHENOUS VEIN  11/12/2021   Procedure: ENDOVEIN HARVEST OF GREATER SAPHENOUS VEIN;  Surgeon: Dahlia Byes, MD;  Location: [place];  Service: Open Heart Surgery;;   EYE SURGERY Bilateral 2022   IR FLUORO GUIDE CV LINE RIGHT  11/28/2021   IR US GUIDE VASC ACCESS RIGHT  11/28/2021   LEFT HEART CATH AND CORONARY ANGIOGRAPHY N/A 10/24/2021   Procedure: LEFT HEART CATH AND CORONARY ANGIOGRAPHY;  Surgeon: Jettie Booze, MD;  Location: [place] CV LAB;  Service: Cardiovascular;  Laterality: N/A;  MAZE N/A 11/12/2021   Procedure: MAZE;  Surgeon: Dahlia Byes, MD;  Location: [place];  Service: Open Heart Surgery;  Laterality: N/A;   TEE WITHOUT CARDIOVERSION N/A 11/12/2021   Procedure: TRANSESOPHAGEAL ECHOCARDIOGRAM (TEE);  Surgeon: Dahlia Byes, MD;  Location: [place];  Service: Open Heart Surgery;  Laterality: N/A;   TEE WITHOUT CARDIOVERSION N/A 11/21/2021   Procedure: TRANSESOPHAGEAL ECHOCARDIOGRAM (TEE);  Surgeon:  Larey Dresser, MD;  Location: [facility] ENDOSCOPY;  Service: Cardiovascular;  Laterality: N/A;    Current Outpatient Medications  Medication Sig Dispense Refill   albuterol (VENTOLIN HFA) 108 (90 Base) MCG/ACT inhaler Inhale 2 puffs into the lungs every 6 (six) hours as needed for wheezing or shortness of breath.     amiodarone (PACERONE) 200 MG tablet Take 1 tablet (200 mg total) by mouth in the morning, at noon, and at bedtime. 270 tablet 3   apixaban (ELIQUIS) 5 MG TABS tablet Take 1 tablet (5 mg total) by mouth 2 (two) times daily. 180 tablet 1   aspirin EC 81 MG tablet Take 1 tablet (81 mg total) by mouth daily. Swallow whole. 30 tablet 12   atorvastatin (LIPITOR) 80 MG tablet Take 1 tablet (80 mg total) by mouth daily. 90 tablet 3   B Complex-C-Folic Acid (DIALYVITE 810) 0.8 MG TABS Take 1 tablet by mouth daily.     Dulaglutide (TRULICITY) 3 FB/5.1WC SOPN Inject 3 mg into the skin every Monday.     fluticasone furoate-vilanterol (BREO ELLIPTA) 200-25 MCG/ACT AEPB Inhale 1 puff into the lungs daily.  3   insulin detemir (LEVEMIR) 100 UNIT/ML FlexPen Inject 30 Units into the skin 2 (two) times daily. (Patient taking differently: Inject 35 Units into the skin 2 (two) times daily.) 15 mL 11   Insulin Pen Needle 32G X 4 MM MISC Use to inject Levemir 2 (two) times daily. 100 each 1   metoprolol succinate (TOPROL-XL) 25 MG 24 hr tablet Take 25 mg by mouth daily.     midodrine (PROAMATINE) 5 MG tablet Take 1 tablet (5 mg total) by mouth 3 (three) times daily with meals. 270 tablet 3   torsemide (DEMADEX) 20 MG tablet Take 20 mg by mouth daily.     acetaminophen (TYLENOL) 325 MG tablet Take 2 tablets (650 mg total) by mouth every 6 (six) hours as needed for mild pain. (Patient not taking: Reported on 01/20/2022)     losartan (COZAAR) 50 MG tablet Take 50 mg by mouth daily. (Patient not taking: Reported on 02/18/2022)     pantoprazole (PROTONIX) 40 MG tablet Take 40 mg by mouth daily. (Patient not taking:  Reported on 02/18/2022)     traMADol (ULTRAM) 50 MG tablet Take 1 tablet (50 mg total) by mouth every 12 (twelve) hours as needed for moderate pain. (Patient not taking: Reported on 01/20/2022) 24 tablet 0   No current facility-administered medications for this visit.    No Known Allergies  Social History   Socioeconomic History   Marital status: Married    Spouse name: Not on file   Number of children: Not on file   Years of education: Not on file   Highest education level: Not on file  Occupational History   Not on file  Tobacco Use   Smoking status: Former    Types: Cigarettes    Quit date: 2007    Years since quitting: 16.6   Smokeless tobacco: Never  Vaping Use   Vaping Use: Never used  Substance and Sexual Activity  Alcohol use: No   Drug use: Yes    Frequency: 1.0 times per week    Types: Marijuana   Sexual activity: Not on file  Other Topics Concern   Not on file  Social History Narrative   Not on file   Social Determinants of Health   Financial Resource Strain: Not on file  Food Insecurity: Not on file  Transportation Needs: Not on file  Physical Activity: Not on file  Stress: Not on file  Social Connections: Not on file  Intimate Partner Violence: Not on file    Review of Systems: All other systems reviewed and are otherwise negative except as noted above.  Physical Exam: Vitals:   02/18/22 1040  BP: 126/70  Pulse: (!) 109  SpO2: 94%  Weight: 258 lb 6.4 oz (117.2 kg)  Height: 5\' 10"  (1.778 m)    GEN- The patient is well appearing, alert and oriented x 3 today.   HEENT: normocephalic, atraumatic; sclera clear, conjunctiva pink; hearing intact; oropharynx clear; neck supple, no JVP Lymph- no cervical lymphadenopathy Lungs- Clear to ausculation bilaterally, normal work of breathing.  No wheezes, rales, rhonchi Heart- Irregularly irregular rate and rhythm, no murmurs, rubs or gallops, PMI not laterally displaced GI- Obese, soft, non-tender,  non-distended, bowel sounds present, no hepatosplenomegaly Extremities- no clubbing or cyanosis. 1+ bilateral edema ; DP/PT/radial pulses 2+ bilaterally MS- no significant deformity or atrophy Skin- warm and dry, no rash or lesion Psych- euthymic mood, full affect Neuro- strength and sensation are intact  EKG is ordered. Personal review of EKG from today shows AFL vs coarse fib at 109 bpm  Additional studies reviewed include: Previous EP office notes.   Assessment and Plan: 1. Persistent atrial fibrillation Continue Xarelto 20 mg daily S/p MAZE procedure  Continue amiodarone at 200 mg BID.  He is not a candidate for tikosyn currently given ESRD. Would also require long wash out of amiodarone.  Remains out of rhythm. Will plan for cardioversion, and then follow up with Dr. Curt Bears to discuss best options.  Discussed risks, benefits, and alternatives to cardioversion today.   2. OSA Encouraged nightly CPAP  3. HTN Stable on current regimen   4. CAD s/p CABG 10/2021 Following with Dr. Darcey Nora  5. ESRD Currently M/W/F through an HD cath.  There is a change he may be able to come off dialysis.  Last Cr in system 2.10  Follow up with Dr. Curt Bears in  6-8 weeks post [place], PA-C  02/18/22 10:46 AM

## 2022-02-12 DIAGNOSIS — Z992 Dependence on renal dialysis: Secondary | ICD-10-CM | POA: Diagnosis not present

## 2022-02-12 DIAGNOSIS — N2581 Secondary hyperparathyroidism of renal origin: Secondary | ICD-10-CM | POA: Diagnosis not present

## 2022-02-12 DIAGNOSIS — N179 Acute kidney failure, unspecified: Secondary | ICD-10-CM | POA: Diagnosis not present

## 2022-02-14 DIAGNOSIS — Z992 Dependence on renal dialysis: Secondary | ICD-10-CM | POA: Diagnosis not present

## 2022-02-14 DIAGNOSIS — N179 Acute kidney failure, unspecified: Secondary | ICD-10-CM | POA: Diagnosis not present

## 2022-02-14 DIAGNOSIS — N2581 Secondary hyperparathyroidism of renal origin: Secondary | ICD-10-CM | POA: Diagnosis not present

## 2022-02-17 ENCOUNTER — Ambulatory Visit: Payer: Self-pay | Admitting: Cardiothoracic Surgery

## 2022-02-17 DIAGNOSIS — Z992 Dependence on renal dialysis: Secondary | ICD-10-CM | POA: Diagnosis not present

## 2022-02-17 DIAGNOSIS — N179 Acute kidney failure, unspecified: Secondary | ICD-10-CM | POA: Diagnosis not present

## 2022-02-17 DIAGNOSIS — N2581 Secondary hyperparathyroidism of renal origin: Secondary | ICD-10-CM | POA: Diagnosis not present

## 2022-02-18 ENCOUNTER — Ambulatory Visit: Payer: Medicare HMO | Attending: Student | Admitting: Student

## 2022-02-18 ENCOUNTER — Ambulatory Visit (INDEPENDENT_AMBULATORY_CARE_PROVIDER_SITE_OTHER): Payer: Medicare HMO | Admitting: Cardiothoracic Surgery

## 2022-02-18 ENCOUNTER — Encounter: Payer: Self-pay | Admitting: Student

## 2022-02-18 ENCOUNTER — Encounter: Payer: Self-pay | Admitting: Cardiothoracic Surgery

## 2022-02-18 VITALS — BP 115/58 | HR 108 | Resp 18 | Wt 259.0 lb

## 2022-02-18 VITALS — BP 126/70 | HR 109 | Ht 70.0 in | Wt 258.4 lb

## 2022-02-18 DIAGNOSIS — Z951 Presence of aortocoronary bypass graft: Secondary | ICD-10-CM | POA: Diagnosis not present

## 2022-02-18 DIAGNOSIS — I25708 Atherosclerosis of coronary artery bypass graft(s), unspecified, with other forms of angina pectoris: Secondary | ICD-10-CM

## 2022-02-18 DIAGNOSIS — Z4889 Encounter for other specified surgical aftercare: Secondary | ICD-10-CM | POA: Insufficient documentation

## 2022-02-18 DIAGNOSIS — I2510 Atherosclerotic heart disease of native coronary artery without angina pectoris: Secondary | ICD-10-CM

## 2022-02-18 DIAGNOSIS — Z7901 Long term (current) use of anticoagulants: Secondary | ICD-10-CM | POA: Diagnosis not present

## 2022-02-18 DIAGNOSIS — I480 Paroxysmal atrial fibrillation: Secondary | ICD-10-CM

## 2022-02-18 MED ORDER — AMIODARONE HCL 200 MG PO TABS: 200.0000 mg | ORAL_TABLET | Freq: Two times a day (BID) | ORAL | 3 refills | Status: DC

## 2022-02-18 NOTE — Patient Instructions (Signed)
Medication Instructions:  Your physician has recommended you make the following change in your medication:   DECREASE: Amiodarone to 200mg  twice daily  *If you need a refill on your cardiac medications before your next appointment, please call your pharmacy*   Lab Work: TODAY: BMET, CBCw/diff  If you have labs (blood work) drawn today and your tests are completely normal, you will receive your results only by: [place] (if you have MyChart) OR A paper copy in the mail If you have any lab test that is abnormal or we need to change your treatment, we will call you to review the results.   Follow-Up: At [facility], you and your health needs are our priority.  As part of our continuing mission to provide you with exceptional heart care, we have created designated Provider Care Teams.  These Care Teams include your primary Cardiologist (physician) and Advanced Practice Providers (APPs -  Physician Assistants and Nurse Practitioners) who all work together to provide you with the care you need, when you need it.  We recommend signing up for the patient portal called "MyChart".  Sign up information is provided on this After Visit Summary.  MyChart is used to connect with patients for Virtual Visits (Telemedicine).  Patients are able to view lab/test results, encounter notes, upcoming appointments, etc.  Non-urgent messages can be sent to your provider as well.   To learn more about what you can do with MyChart, go to [email or web address].    Your next appointment:   6 week(s)  The format for your next appointment:   In Person  Provider:   Allegra Lai, MD    Other Instructions See letter for Cardioversion Instructions

## 2022-02-18 NOTE — Progress Notes (Signed)
HPI Patient presents for final surgical postop visit after multivessel CABG with combined maze/pulmonary vein isolation ablation in late May.  Patient developed postoperative renal insufficiency related to his diabetes and is still on outpatient dialysis.  He is ambulating without oxygen.  He still has some left hip discomfort probably related to sciatica.  He denies any recurrent angina.  He is still having intermittent atrial fibrillation and is on amiodarone and Eliquis.  He is having a Zio patch monitor assessed by EP cardiology.  Current Outpatient Medications  Medication Sig Dispense Refill   acetaminophen (TYLENOL) 325 MG tablet Take 2 tablets (650 mg total) by mouth every 6 (six) hours as needed for mild pain.     albuterol (VENTOLIN HFA) 108 (90 Base) MCG/ACT inhaler Inhale 2 puffs into the lungs every 6 (six) hours as needed for wheezing or shortness of breath.     amiodarone (PACERONE) 200 MG tablet Take 1 tablet (200 mg total) by mouth 2 (two) times daily. 180 tablet 3   apixaban (ELIQUIS) 5 MG TABS tablet Take 1 tablet (5 mg total) by mouth 2 (two) times daily. 180 tablet 1   aspirin EC 81 MG tablet Take 1 tablet (81 mg total) by mouth daily. Swallow whole. 30 tablet 12   atorvastatin (LIPITOR) 80 MG tablet Take 1 tablet (80 mg total) by mouth daily. 90 tablet 3   B Complex-C-Folic Acid (DIALYVITE 503) 0.8 MG TABS Take 1 tablet by mouth daily.     Dulaglutide (TRULICITY) 3 TW/6.5KC SOPN Inject 3 mg into the skin every Monday.     fluticasone furoate-vilanterol (BREO ELLIPTA) 200-25 MCG/ACT AEPB Inhale 1 puff into the lungs daily.  3   insulin detemir (LEVEMIR) 100 UNIT/ML FlexPen Inject 30 Units into the skin 2 (two) times daily. (Patient taking differently: Inject 35 Units into the skin 2 (two) times daily.) 15 mL 11   Insulin Pen Needle 32G X 4 MM MISC Use to inject Levemir 2 (two) times daily. 100 each 1   losartan (COZAAR) 50 MG tablet Take 50 mg by mouth daily.     metoprolol  succinate (TOPROL-XL) 25 MG 24 hr tablet Take 25 mg by mouth daily.     midodrine (PROAMATINE) 5 MG tablet Take 1 tablet (5 mg total) by mouth 3 (three) times daily with meals. 270 tablet 3   pantoprazole (PROTONIX) 40 MG tablet Take 40 mg by mouth daily.     torsemide (DEMADEX) 20 MG tablet Take 20 mg by mouth daily.     traMADol (ULTRAM) 50 MG tablet Take 1 tablet (50 mg total) by mouth every 12 (twelve) hours as needed for moderate pain. 24 tablet 0   No current facility-administered medications for this visit.     Review of Systems: Weight stable Tries to walk at least 15 minutes daily. Surgical incisions all healing well.  Physical Exam Blood pressure (!) 115/58, pulse (!) 108, resp. rate 18, weight 259 lb (117.5 kg), SpO2 91 %.   Alert and oriented Lungs clear Heart rate regular today Abdomen obese but nontender No peripheral edema Neuro intact  Diagnostic Tests: None today  Impression: Patient is now healed from CABG and does not have to follow sternal restrictions.  He is encouraged to follow heart healthy diet and regular daily walks of 20 minutes.  He will return here as necessary.  Plan: Follow-up per cardiology and nephrology.  Return here as needed.    Dahlia Byes, MD [facility] [phone]

## 2022-02-19 DIAGNOSIS — Z992 Dependence on renal dialysis: Secondary | ICD-10-CM | POA: Diagnosis not present

## 2022-02-19 DIAGNOSIS — N179 Acute kidney failure, unspecified: Secondary | ICD-10-CM | POA: Diagnosis not present

## 2022-02-19 DIAGNOSIS — N2581 Secondary hyperparathyroidism of renal origin: Secondary | ICD-10-CM | POA: Diagnosis not present

## 2022-02-19 LAB — BASIC METABOLIC PANEL
BUN/Creatinine Ratio: 10 (ref 10–24)
BUN: 37 mg/dL — ABNORMAL HIGH (ref 8–27)
CO2: 28 mmol/L (ref 20–29)
Calcium: 9 mg/dL (ref 8.6–10.2)
Chloride: 96 mmol/L (ref 96–106)
Creatinine, Ser: 3.63 mg/dL — ABNORMAL HIGH (ref 0.76–1.27)
Glucose: 368 mg/dL — ABNORMAL HIGH (ref 70–99)
Potassium: 3.9 mmol/L (ref 3.5–5.2)
Sodium: 140 mmol/L (ref 134–144)
eGFR: 18 mL/min/{1.73_m2} — ABNORMAL LOW (ref >59)

## 2022-02-19 LAB — CBC WITH DIFFERENTIAL/PLATELET
Basophils Absolute: 0.1 10*3/uL (ref 0.0–0.2)
Basos: 1 %
EOS (ABSOLUTE): 0.3 10*3/uL (ref 0.0–0.4)
Eos: 3 %
Hematocrit: 28.5 % — ABNORMAL LOW (ref 37.5–51.0)
Hemoglobin: 8.8 g/dL — ABNORMAL LOW (ref 13.0–17.7)
Immature Grans (Abs): 0 10*3/uL (ref 0.0–0.1)
Immature Granulocytes: 0 %
Lymphocytes Absolute: 1.4 10*3/uL (ref 0.7–3.1)
Lymphs: 16 %
MCH: 27 pg (ref 26.6–33.0)
MCHC: 30.9 g/dL — ABNORMAL LOW (ref 31.5–35.7)
MCV: 87 fL (ref 79–97)
Monocytes Absolute: 1 10*3/uL — ABNORMAL HIGH (ref 0.1–0.9)
Monocytes: 11 %
Neutrophils Absolute: 6.1 10*3/uL (ref 1.4–7.0)
Neutrophils: 69 %
Platelets: 288 10*3/uL (ref 150–450)
RBC: 3.26 x10E6/uL — ABNORMAL LOW (ref 4.14–5.80)
RDW: 14.6 % (ref 11.6–15.4)
WBC: 8.8 10*3/uL (ref 3.4–10.8)

## 2022-02-20 ENCOUNTER — Encounter (HOSPITAL_COMMUNITY): Payer: Self-pay | Admitting: Cardiology

## 2022-02-24 DIAGNOSIS — N2581 Secondary hyperparathyroidism of renal origin: Secondary | ICD-10-CM | POA: Diagnosis not present

## 2022-02-24 DIAGNOSIS — N179 Acute kidney failure, unspecified: Secondary | ICD-10-CM | POA: Diagnosis not present

## 2022-02-24 DIAGNOSIS — Z992 Dependence on renal dialysis: Secondary | ICD-10-CM | POA: Diagnosis not present

## 2022-02-26 DIAGNOSIS — N179 Acute kidney failure, unspecified: Secondary | ICD-10-CM | POA: Diagnosis not present

## 2022-02-26 DIAGNOSIS — N2581 Secondary hyperparathyroidism of renal origin: Secondary | ICD-10-CM | POA: Diagnosis not present

## 2022-02-26 DIAGNOSIS — Z992 Dependence on renal dialysis: Secondary | ICD-10-CM | POA: Diagnosis not present

## 2022-02-27 ENCOUNTER — Ambulatory Visit (HOSPITAL_BASED_OUTPATIENT_CLINIC_OR_DEPARTMENT_OTHER): Payer: Medicare HMO | Admitting: Certified Registered"

## 2022-02-27 ENCOUNTER — Ambulatory Visit (HOSPITAL_COMMUNITY): Payer: Medicare HMO | Admitting: Certified Registered"

## 2022-02-27 ENCOUNTER — Ambulatory Visit (HOSPITAL_COMMUNITY)
Admission: RE | Admit: 2022-02-27 | Discharge: 2022-02-27 | Disposition: A | Discharge status: Home / Self Care | Payer: Medicare HMO | Attending: Cardiology | Admitting: Cardiology

## 2022-02-27 ENCOUNTER — Encounter (HOSPITAL_COMMUNITY)
Admission: RE | Disposition: A | Discharge status: Home / Self Care | Payer: Self-pay | Source: Home / Self Care | Attending: Cardiology

## 2022-02-27 ENCOUNTER — Other Ambulatory Visit: Payer: Self-pay

## 2022-02-27 DIAGNOSIS — Z79899 Other long term (current) drug therapy: Secondary | ICD-10-CM | POA: Diagnosis not present

## 2022-02-27 DIAGNOSIS — N189 Chronic kidney disease, unspecified: Secondary | ICD-10-CM | POA: Diagnosis not present

## 2022-02-27 DIAGNOSIS — I4892 Unspecified atrial flutter: Secondary | ICD-10-CM

## 2022-02-27 DIAGNOSIS — I130 Hypertensive heart and chronic kidney disease with heart failure and stage 1 through stage 4 chronic kidney disease, or unspecified chronic kidney disease: Secondary | ICD-10-CM

## 2022-02-27 DIAGNOSIS — Z794 Long term (current) use of insulin: Secondary | ICD-10-CM | POA: Diagnosis not present

## 2022-02-27 DIAGNOSIS — N186 End stage renal disease: Secondary | ICD-10-CM | POA: Diagnosis not present

## 2022-02-27 DIAGNOSIS — Z992 Dependence on renal dialysis: Secondary | ICD-10-CM | POA: Insufficient documentation

## 2022-02-27 DIAGNOSIS — Z7901 Long term (current) use of anticoagulants: Secondary | ICD-10-CM | POA: Insufficient documentation

## 2022-02-27 DIAGNOSIS — G4733 Obstructive sleep apnea (adult) (pediatric): Secondary | ICD-10-CM | POA: Insufficient documentation

## 2022-02-27 DIAGNOSIS — E1122 Type 2 diabetes mellitus with diabetic chronic kidney disease: Secondary | ICD-10-CM | POA: Insufficient documentation

## 2022-02-27 DIAGNOSIS — I5022 Chronic systolic (congestive) heart failure: Secondary | ICD-10-CM | POA: Insufficient documentation

## 2022-02-27 DIAGNOSIS — Z87891 Personal history of nicotine dependence: Secondary | ICD-10-CM | POA: Insufficient documentation

## 2022-02-27 DIAGNOSIS — I2510 Atherosclerotic heart disease of native coronary artery without angina pectoris: Secondary | ICD-10-CM

## 2022-02-27 DIAGNOSIS — I509 Heart failure, unspecified: Secondary | ICD-10-CM

## 2022-02-27 DIAGNOSIS — Z7985 Long-term (current) use of injectable non-insulin antidiabetic drugs: Secondary | ICD-10-CM | POA: Diagnosis not present

## 2022-02-27 DIAGNOSIS — Z951 Presence of aortocoronary bypass graft: Secondary | ICD-10-CM | POA: Insufficient documentation

## 2022-02-27 DIAGNOSIS — I4819 Other persistent atrial fibrillation: Secondary | ICD-10-CM | POA: Diagnosis not present

## 2022-02-27 DIAGNOSIS — I132 Hypertensive heart and chronic kidney disease with heart failure and with stage 5 chronic kidney disease, or end stage renal disease: Secondary | ICD-10-CM | POA: Diagnosis not present

## 2022-02-27 DIAGNOSIS — D631 Anemia in chronic kidney disease: Secondary | ICD-10-CM | POA: Diagnosis not present

## 2022-02-27 DIAGNOSIS — I480 Paroxysmal atrial fibrillation: Secondary | ICD-10-CM

## 2022-02-27 HISTORY — PX: CARDIOVERSION: SHX1299

## 2022-02-27 LAB — GLUCOSE, CAPILLARY: Glucose-Capillary: 269 mg/dL — ABNORMAL HIGH (ref 70–99)

## 2022-02-27 SURGERY — CARDIOVERSION
Anesthesia: Monitor Anesthesia Care

## 2022-02-27 MED ORDER — SODIUM CHLORIDE 0.9 % IV SOLN
INTRAVENOUS | Status: AC | PRN
  Administered 2022-02-27: 500 mL via INTRAVENOUS

## 2022-02-27 MED ORDER — PROPOFOL 10 MG/ML IV BOLUS
INTRAVENOUS | Status: DC | PRN
  Administered 2022-02-27: 50 mg via INTRAVENOUS

## 2022-02-27 MED ORDER — LIDOCAINE 2% (20 MG/ML) 5 ML SYRINGE
INTRAMUSCULAR | Status: DC | PRN
  Administered 2022-02-27: 100 mg via INTRAVENOUS

## 2022-02-27 NOTE — Discharge Instructions (Signed)

## 2022-02-27 NOTE — Anesthesia Postprocedure Evaluation (Addendum)
Anesthesia Post Note  Patient: [name] [name]  Procedure(s) Performed: CARDIOVERSION     Patient location during evaluation: Endoscopy Anesthesia Type: MAC and General Level of consciousness: awake and alert Pain management: pain level controlled Vital Signs Assessment: post-procedure vital signs reviewed and stable Respiratory status: spontaneous breathing, nonlabored ventilation and respiratory function stable Cardiovascular status: blood pressure returned to baseline and stable Postop Assessment: no apparent nausea or vomiting Anesthetic complications: no   No notable events documented.  Last Vitals:  Vitals:   02/27/22 1008 02/27/22 1022  BP: (!) 121/44 128/66  Pulse: 78 76  Resp: (!) 23 (!) 22  Temp:    SpO2: 96% 95%    Last Pain:  Vitals:   02/27/22 0958  TempSrc: Temporal  PainSc: Asleep                 Lidia Collum

## 2022-02-27 NOTE — CV Procedure (Signed)
Procedure:   DCCV  Indication:  Symptomatic atrial flutter  Procedure Note:  The patient signed informed consent.  They have had had therapeutic anticoagulation with Eliquis greater than 3 weeks.  Anesthesia was administered by Dr. Christella Hartigan and Leane Platt, CRNA.  Adequate airway was maintained throughout and vital followed per protocol.  They were cardioverted x 1 with 100J of biphasic synchronized energy.  They converted to NSR with rate 70s.  There were no apparent complications.  The patient had normal neuro status and respiratory status post procedure with vitals stable as recorded elsewhere.    Follow up:  They will continue on current medical therapy and follow up with cardiology as scheduled.  Oswaldo Milian, MD 02/27/2022 9:58 AM

## 2022-02-27 NOTE — Transfer of Care (Signed)
Immediate Anesthesia Transfer of Care Note  Patient: [name] [name]  Procedure(s) Performed: CARDIOVERSION  Patient Location: Endoscopy Unit  Anesthesia Type:General  Level of Consciousness: awake, drowsy and patient cooperative  Airway & Oxygen Therapy: Patient Spontanous Breathing  Post-op Assessment: Report given to RN, Post -op Vital signs reviewed and stable and Patient moving all extremities X 4  Post vital signs: Reviewed and stable  Last Vitals:  Vitals Value Taken Time  BP    Temp    Pulse    Resp    SpO2      Last Pain:  Vitals:   02/27/22 0855  TempSrc: Temporal  PainSc: 0-No pain         Complications: No notable events documented.

## 2022-02-27 NOTE — Interval H&P Note (Signed)
History and Physical Interval Note:  02/27/2022 9:36 AM  [name]  has presented today for surgery, with the diagnosis of AFIB.  The various methods of treatment have been discussed with the patient and family. After consideration of risks, benefits and other options for treatment, the patient has consented to  Procedure(s): CARDIOVERSION (N/A) as a surgical intervention.  The patient's history has been reviewed, patient examined, no change in status, stable for surgery.  I have reviewed the patient's chart and labs.  Questions were answered to the patient's satisfaction.     [name] [name]

## 2022-02-27 NOTE — Anesthesia Preprocedure Evaluation (Signed)
Anesthesia Evaluation  Patient identified by MRN, date of birth, ID band Patient awake    Reviewed: Allergy & Precautions, H&P , NPO status , Patient's Chart, lab work & pertinent test results  History of Anesthesia Complications Negative for: history of anesthetic complications  Airway Mallampati: II  TM Distance: >3 FB Neck ROM: full    Dental   Pulmonary sleep apnea , former smoker,    Pulmonary exam normal        Cardiovascular hypertension, + CAD, + CABG and +CHF  + dysrhythmias Atrial Fibrillation  Rhythm:irregular   Echo 11/21/21: 1. Left ventricular ejection fraction, by estimation, is 55 to 60%. The  left ventricle has normal function. The left ventricle has no regional  wall motion abnormalities but there is septal bounce suggestive of prior  cardiac surgery.  2. Right ventricular systolic function is mildly reduced. The right  ventricular size is normal. Tricuspid regurgitation signal is inadequate  for assessing PA pressure.  3. Left atrial size was mildly dilated. No left atrial/left atrial  appendage thrombus was detected.  4. There is a PFO by color doppler and bubble study.  5. The mitral valve is normal in structure. Mild mitral valve  regurgitation. No evidence of mitral stenosis.  6. The aortic valve is tricuspid. There is mild calcification of the  aortic valve. Aortic valve regurgitation is not visualized. No aortic  stenosis is present.  7. A small pericardial effusion is present, primarily towards the apex.    Neuro/Psych negative neurological ROS  negative psych ROS   GI/Hepatic negative GI ROS, Neg liver ROS,   Endo/Other  diabetes, Type 2  Renal/GU CRFRenal disease  negative genitourinary   Musculoskeletal   Abdominal   Peds  Hematology  (+) Blood dyscrasia, anemia ,   Anesthesia Other Findings   Reproductive/Obstetrics                              Anesthesia Physical Anesthesia Plan  ASA: 3  Anesthesia Plan: General   Post-op Pain Management:    Induction:   PONV Risk Score and Plan:   Airway Management Planned:   Additional Equipment:   Intra-op Plan:   Post-operative Plan:   Informed Consent: I have reviewed the patients History and Physical, chart, labs and discussed the procedure including the risks, benefits and alternatives for the proposed anesthesia with the patient or authorized representative who has indicated his/her understanding and acceptance.       Plan Discussed with: Anesthesiologist  Anesthesia Plan Comments:         Anesthesia Quick Evaluation

## 2022-02-28 DIAGNOSIS — Z992 Dependence on renal dialysis: Secondary | ICD-10-CM | POA: Diagnosis not present

## 2022-02-28 DIAGNOSIS — N2581 Secondary hyperparathyroidism of renal origin: Secondary | ICD-10-CM | POA: Diagnosis not present

## 2022-02-28 DIAGNOSIS — N179 Acute kidney failure, unspecified: Secondary | ICD-10-CM | POA: Diagnosis not present

## 2022-03-02 ENCOUNTER — Encounter (HOSPITAL_COMMUNITY): Payer: Self-pay | Admitting: Cardiology

## 2022-03-03 DIAGNOSIS — N2581 Secondary hyperparathyroidism of renal origin: Secondary | ICD-10-CM | POA: Diagnosis not present

## 2022-03-03 DIAGNOSIS — Z992 Dependence on renal dialysis: Secondary | ICD-10-CM | POA: Diagnosis not present

## 2022-03-03 DIAGNOSIS — N179 Acute kidney failure, unspecified: Secondary | ICD-10-CM | POA: Diagnosis not present

## 2022-03-04 NOTE — Progress Notes (Unsigned)
Cardiology Office Note:    Date:  03/05/2022   ID:  [name] [name], DOB 04-Mar-1956, MRN [id number]  PCP:  Cyndi Bender, PA-C  Cardiologist:  Shirlee More, MD    Referring MD: Cyndi Bender, PA-C    ASSESSMENT:    1. Paroxysmal atrial fibrillation (HCC)   2. On amiodarone therapy   3. Chronic anticoagulation   4. Stage 3 chronic kidney disease, unspecified whether stage 3a or 3b CKD ([place])   5. Anemia, unspecified type   6. Coronary artery disease of bypass graft of native heart with stable angina pectoris ([place])   7. Hypertensive heart and chronic kidney disease with heart failure and stage 1 through stage 4 chronic kidney disease, or unspecified chronic kidney disease ([place])    PLAN:    In order of problems listed above:  He is maintaining sinus rhythm after cardioversion surgical maze on amiodarone dose was reduced to 400 mg daily and can to keep it there until his next visit with me.  He has frequent labs done at hemodialysis.  He will continue his anticoagulant.  He is optimistic that he will be able to get by in the future without renal replacement therapy. Last hemoglobin improved at 8.8 and tells me he has labs done at the dialysis center to follow his anemia with CKD Stable CAD following his bypass surgery having no angina Improved blood pressure and no longer hypotensive continue midodrine and readdress at his next visit Stable CKD being managed for renal replacement therapy   Next appointment: 2 months   Medication Adjustments/Labs and Tests Ordered: Current medicines are reviewed at length with the patient today.  Concerns regarding medicines are outlined above.  Orders Placed This Encounter  Procedures   EKG 12-Lead   No orders of the defined types were placed in this encounter.   Chief Complaint  Patient presents with   Follow-up   Atrial Fibrillation    He is on amiodarone and had recent cardioversion resuming sinus rhythm.  He had surgical Maze performed  in May.    History of Present Illness:    [name] [name] is a 66 y.o. male with a hx of atrial fibrillation hypertensive heart disease with chronic diastolic heart failure chronic kidney disease and CABG 11/12/2021 with recurrent postoperative atrial fibrillation flutter requiring multiple cardioversions as inpatient.  With acute on chronic kidney disease he required hemodialysis and his course was complicated by hypotension requiring vasopressors IV and oral midodrine.  He also had anemia and was transfused 1 unit of packed cells 01/17/2022 for hemoglobin of 6.4.  With ongoing atrial fibrillation and flutter rapid rate he was again cardioverted sinus rhythm 02/27/2022 at [facility]. Compliance with diet, lifestyle and medications: Yes  He has markedly improved no edema shortness of breath chest pain palpitation or syncope he continues on dialysis 2 days a week Past Medical History:  Diagnosis Date   Atherosclerotic heart disease    Benign essential HTN 07/30/2014   Bradycardia 01/25/2015   CHF (congestive heart failure) (HCC)    Chronic anticoagulation 12/75/1700   Chronic systolic CHF (congestive heart failure), NYHA class 3 ([place]) 08/30/2014   Overview:  Global ef 30%   CKD (chronic kidney disease)    Class 3 severe obesity in adult [facility]) 04/28/2017   Coronary artery disease    Diabetes mellitus without complication ([place])    Diabetic neuropathy ([place]) 07/28/2014   Dysrhythmia    Erectile dysfunction    High risk medication use 05/26/2018  Hypertensive heart disease with heart failure ([place]) 07/30/2014   Leukocytosis 11/26/2021   Lumbago    LV dysfunction 04/28/2017   Mixed hyperlipidemia 07/28/2014   OSA (obstructive sleep apnea)    CPAP   Paroxysmal atrial fibrillation ([place]) 07/28/2014   Postop check 01/06/2022   S/P CABG x 3 11/12/2021   Sinus node dysfunction ([place]) 04/21/2016   Testicular hypofunction    Uncontrolled type 2 diabetes mellitus with microalbuminuric diabetic  nephropathy 07/28/2014    Past Surgical History:  Procedure Laterality Date   BUBBLE STUDY  11/21/2021   Procedure: BUBBLE STUDY;  Surgeon: Larey Dresser, MD;  Location: [place];  Service: Cardiovascular;;   CARDIOVERSION N/A 11/21/2021   Procedure: CARDIOVERSION;  Surgeon: Larey Dresser, MD;  Location: [facility] ENDOSCOPY;  Service: Cardiovascular;  Laterality: N/A;   CARDIOVERSION N/A 11/25/2021   Procedure: CARDIOVERSION;  Surgeon: Larey Dresser, MD;  Location: [facility] ENDOSCOPY;  Service: Cardiovascular;  Laterality: N/A;   CARDIOVERSION N/A 11/29/2021   Procedure: CARDIOVERSION;  Surgeon: Larey Dresser, MD;  Location[facility] ENDOSCOPY;  Service: Cardiovascular;  Laterality: N/A;   CARDIOVERSION N/A 02/27/2022   Procedure: CARDIOVERSION;  Surgeon: Donato Heinz, MD;  Location: [facility] ENDOSCOPY;  Service: Cardiovascular;  Laterality: N/A;   CORONARY ARTERY BYPASS GRAFT N/A 11/12/2021   Procedure: CORONARY ARTERY BYPASS GRAFTING (CABG) x  3 USING LEFT INTERNAL MAMMARY ARTERY AND RIGHT GREATER SAPHENOUS VEIN;  Surgeon: Dahlia Byes, MD;  Location: [place];  Service: Open Heart Surgery;  Laterality: N/A;   ELECTROPHYSIOLOGIC STUDY N/A 01/18/2015   Procedure: CARDIOVERSION;  Surgeon: [name], MD;  Location: [facility] ORS;  Service: Cardiovascular;  Laterality: N/A;   ENDOVEIN HARVEST OF GREATER SAPHENOUS VEIN  11/12/2021   Procedure: ENDOVEIN HARVEST OF GREATER SAPHENOUS VEIN;  Surgeon: Dahlia Byes, MD;  Location: [place];  Service: Open Heart Surgery;;   EYE SURGERY Bilateral 2022   IR FLUORO GUIDE CV LINE RIGHT  11/28/2021   IR US GUIDE VASC ACCESS RIGHT  11/28/2021   LEFT HEART CATH AND CORONARY ANGIOGRAPHY N/A 10/24/2021   Procedure: LEFT HEART CATH AND CORONARY ANGIOGRAPHY;  Surgeon: Jettie Booze, MD;  Location: [place] CV LAB;  Service: Cardiovascular;  Laterality: N/A;   MAZE N/A 11/12/2021   Procedure: MAZE;  Surgeon: Dahlia Byes, MD;  Location: [place];  Service: Open  Heart Surgery;  Laterality: N/A;   TEE WITHOUT CARDIOVERSION N/A 11/12/2021   Procedure: TRANSESOPHAGEAL ECHOCARDIOGRAM (TEE);  Surgeon: Dahlia Byes, MD;  Location: [place];  Service: Open Heart Surgery;  Laterality: N/A;   TEE WITHOUT CARDIOVERSION N/A 11/21/2021   Procedure: TRANSESOPHAGEAL ECHOCARDIOGRAM (TEE);  Surgeon: Larey Dresser, MD;  Location: [facility] ENDOSCOPY;  Service: Cardiovascular;  Laterality: N/A;    Current Medications: Current Meds  Medication Sig   albuterol (VENTOLIN HFA) 108 (90 Base) MCG/ACT inhaler Inhale 2 puffs into the lungs every 6 (six) hours as needed for wheezing or shortness of breath.   amiodarone (PACERONE) 200 MG tablet Take 1 tablet (200 mg total) by mouth 2 (two) times daily.   apixaban (ELIQUIS) 5 MG TABS tablet Take 1 tablet (5 mg total) by mouth 2 (two) times daily.   aspirin EC 81 MG tablet Take 1 tablet (81 mg total) by mouth daily. Swallow whole.   atorvastatin (LIPITOR) 80 MG tablet Take 1 tablet (80 mg total) by mouth daily.   B Complex-C-Folic Acid (DIALYVITE 270) 0.8 MG TABS Take 1 tablet by mouth daily.   Dulaglutide (TRULICITY) 3  MG/0.5ML SOPN Inject 3 mg into the skin every Monday.   fluticasone furoate-vilanterol (BREO ELLIPTA) 200-25 MCG/ACT AEPB Inhale 1 puff into the lungs daily.   insulin detemir (LEVEMIR) 100 UNIT/ML FlexPen Inject 30 Units into the skin 2 (two) times daily. (Patient taking differently: Inject 35 Units into the skin 2 (two) times daily.)   Insulin Pen Needle 32G X 4 MM MISC Use to inject Levemir 2 (two) times daily.   metoprolol succinate (TOPROL-XL) 25 MG 24 hr tablet Take 25 mg by mouth daily.   midodrine (PROAMATINE) 5 MG tablet Take 1 tablet (5 mg total) by mouth 3 (three) times daily with meals. (Patient taking differently: Take 5 mg by mouth 2 (two) times daily with a meal.)   torsemide (DEMADEX) 20 MG tablet Take 20 mg by mouth 2 (two) times daily.   traMADol (ULTRAM) 50 MG tablet Take 1 tablet (50 mg total) by mouth  every 12 (twelve) hours as needed for moderate pain.     Allergies:   Patient has no known allergies.   Social History   Socioeconomic History   Marital status: Married    Spouse name: Not on file   Number of children: Not on file   Years of education: Not on file   Highest education level: Not on file  Occupational History   Not on file  Tobacco Use   Smoking status: Former    Types: Cigarettes    Quit date: 2007    Years since quitting: 16.7   Smokeless tobacco: Never  Vaping Use   Vaping Use: Never used  Substance and Sexual Activity   Alcohol use: No   Drug use: Yes    Frequency: 1.0 times per week    Types: Marijuana   Sexual activity: Not on file  Other Topics Concern   Not on file  Social History Narrative   Not on file   Social Determinants of Health   Financial Resource Strain: Not on file  Food Insecurity: Not on file  Transportation Needs: Not on file  Physical Activity: Not on file  Stress: Not on file  Social Connections: Not on file     Family History: The patient's family history includes CAD in his brother and mother; Drug abuse in his brother; Lung cancer in his father. ROS:   Please see the history of present illness.    All other systems reviewed and are negative.  EKGs/Labs/Other Studies Reviewed:    The following studies were reviewed today:  EKG:  EKG ordered today and personally reviewed.  The ekg ordered today demonstrates sinus rhythm right bundle branch block  He had CABG performed 11/12/2021: PROCEDURE:  Procedure(s): CORONARY ARTERY BYPASS GRAFTING (CABG) x  3 USING LEFT INTERNAL MAMMARY ARTERY AND RIGHT GREATER SAPHENOUS VEIN (N/A) MAZE (N/A) TRANSESOPHAGEAL ECHOCARDIOGRAM (TEE) (N/A) ENDOVEIN HARVEST OF GREATER SAPHENOUS VEIN Compliance with diet, lifestyle and medications: Yes   Echo 11/21/2021 TEE:  1. Left ventricular ejection fraction, by estimation, is 55 to 60%. The  left ventricle has normal function. The left  ventricle has no regional  wall motion abnormalities but there is septal bounce suggestive of prior  cardiac surgery.   2. Right ventricular systolic function is mildly reduced. The right  ventricular size is normal. Tricuspid regurgitation signal is inadequate  for assessing PA pressure.   3. Left atrial size was mildly dilated. No left atrial/left atrial  appendage thrombus was detected.   4. There is a PFO by color doppler and bubble  study.   5. The mitral valve is normal in structure. Mild mitral valve  regurgitation. No evidence of mitral stenosis.   6. The aortic valve is tricuspid. There is mild calcification of the  aortic valve. Aortic valve regurgitation is not visualized. No aortic  stenosis is present.   7. A small pericardial effusion is present, primarily towards the apex.    Recent Labs: 11/18/2021: ALT 9 12/04/2021: Magnesium 2.1 02/18/2022: BUN 37; Creatinine, Ser 3.63; Hemoglobin 8.8; Platelets 288; Potassium 3.9; Sodium 140  Recent Lipid Panel No results found for: "CHOL", "TRIG", "HDL", "CHOLHDL", "VLDL", "LDLCALC", "LDLDIRECT"  Physical Exam:    VS:  BP (!) 154/78 (BP Location: Right Arm, Patient Position: Sitting)   Pulse 83   Ht 5\' 10"  (1.778 m)   Wt 256 lb 3.2 oz (116.2 kg)   SpO2 97%   BMI 36.76 kg/m     Wt Readings from Last 3 Encounters:  03/05/22 256 lb 3.2 oz (116.2 kg)  02/18/22 259 lb (117.5 kg)  02/18/22 258 lb 6.4 oz (117.2 kg)     GEN: Appears much more healthy well nourished, well developed in no acute distress HEENT: Normal NECK: No JVD; No carotid bruits LYMPHATICS: No lymphadenopathy CARDIAC: RRR, no murmurs, rubs, gallops RESPIRATORY:  Clear to auscultation without rales, wheezing or rhonchi  ABDOMEN: Soft, non-tender, non-distended MUSCULOSKELETAL:  No edema; No deformity  SKIN: Warm and dry NEUROLOGIC:  Alert and oriented x 3 PSYCHIATRIC:  Normal affect    Signed, Shirlee More, MD  03/05/2022 11:08 AM    [place]

## 2022-03-05 ENCOUNTER — Ambulatory Visit: Payer: Medicare HMO | Attending: Cardiology | Admitting: Cardiology

## 2022-03-05 ENCOUNTER — Encounter: Payer: Self-pay | Admitting: Cardiology

## 2022-03-05 VITALS — BP 154/78 | HR 83 | Ht 70.0 in | Wt 256.2 lb

## 2022-03-05 DIAGNOSIS — N183 Chronic kidney disease, stage 3 unspecified: Secondary | ICD-10-CM

## 2022-03-05 DIAGNOSIS — I130 Hypertensive heart and chronic kidney disease with heart failure and stage 1 through stage 4 chronic kidney disease, or unspecified chronic kidney disease: Secondary | ICD-10-CM | POA: Diagnosis not present

## 2022-03-05 DIAGNOSIS — I480 Paroxysmal atrial fibrillation: Secondary | ICD-10-CM | POA: Diagnosis not present

## 2022-03-05 DIAGNOSIS — Z7901 Long term (current) use of anticoagulants: Secondary | ICD-10-CM

## 2022-03-05 DIAGNOSIS — I25708 Atherosclerosis of coronary artery bypass graft(s), unspecified, with other forms of angina pectoris: Secondary | ICD-10-CM

## 2022-03-05 DIAGNOSIS — Z79899 Other long term (current) drug therapy: Secondary | ICD-10-CM

## 2022-03-05 DIAGNOSIS — D649 Anemia, unspecified: Secondary | ICD-10-CM | POA: Diagnosis not present

## 2022-03-05 NOTE — Patient Instructions (Signed)
Medication Instructions:  Your physician recommends that you continue on your current medications as directed. Please refer to the Current Medication list given to you today.  *If you need a refill on your cardiac medications before your next appointment, please call your pharmacy*   Lab Work: None If you have labs (blood work) drawn today and your tests are completely normal, you will receive your results only by: [place] (if you have MyChart) OR A paper copy in the mail If you have any lab test that is abnormal or we need to change your treatment, we will call you to review the results.   Testing/Procedures: None   Follow-Up: At [facility], you and your health needs are our priority.  As part of our continuing mission to provide you with exceptional heart care, we have created designated Provider Care Teams.  These Care Teams include your primary Cardiologist (physician) and Advanced Practice Providers (APPs -  Physician Assistants and Nurse Practitioners) who all work together to provide you with the care you need, when you need it.  We recommend signing up for the patient portal called "MyChart".  Sign up information is provided on this After Visit Summary.  MyChart is used to connect with patients for Virtual Visits (Telemedicine).  Patients are able to view lab/test results, encounter notes, upcoming appointments, etc.  Non-urgent messages can be sent to your provider as well.   To learn more about what you can do with MyChart, go to [email or web address].    Your next appointment:   8 week(s)  The format for your next appointment:   In Person  Provider:   Shirlee More, MD    Other Instructions None  Important Information About Sugar

## 2022-03-07 DIAGNOSIS — N179 Acute kidney failure, unspecified: Secondary | ICD-10-CM | POA: Diagnosis not present

## 2022-03-07 DIAGNOSIS — Z992 Dependence on renal dialysis: Secondary | ICD-10-CM | POA: Diagnosis not present

## 2022-03-07 DIAGNOSIS — N2581 Secondary hyperparathyroidism of renal origin: Secondary | ICD-10-CM | POA: Diagnosis not present

## 2022-03-10 DIAGNOSIS — Z992 Dependence on renal dialysis: Secondary | ICD-10-CM | POA: Diagnosis not present

## 2022-03-10 DIAGNOSIS — N179 Acute kidney failure, unspecified: Secondary | ICD-10-CM | POA: Diagnosis not present

## 2022-03-10 DIAGNOSIS — N2581 Secondary hyperparathyroidism of renal origin: Secondary | ICD-10-CM | POA: Diagnosis not present

## 2022-03-11 ENCOUNTER — Other Ambulatory Visit: Payer: Self-pay | Admitting: Cardiology

## 2022-03-11 DIAGNOSIS — I480 Paroxysmal atrial fibrillation: Secondary | ICD-10-CM

## 2022-03-12 NOTE — Telephone Encounter (Signed)
Eliquis 5mg  refill request received. Patient is 66 years old, weight-116.2kg, Crea-3.63 on 02/18/2022, Diagnosis-Afib, and last seen by Dr. Bettina Gavia on 03/05/2022. Dose is appropriate based on dosing criteria. Will send in refill to requested pharmacy.

## 2022-03-14 DIAGNOSIS — N179 Acute kidney failure, unspecified: Secondary | ICD-10-CM | POA: Diagnosis not present

## 2022-03-14 DIAGNOSIS — Z992 Dependence on renal dialysis: Secondary | ICD-10-CM | POA: Diagnosis not present

## 2022-03-14 DIAGNOSIS — N2581 Secondary hyperparathyroidism of renal origin: Secondary | ICD-10-CM | POA: Diagnosis not present

## 2022-03-17 ENCOUNTER — Telehealth: Payer: Self-pay | Admitting: Cardiology

## 2022-03-17 DIAGNOSIS — N179 Acute kidney failure, unspecified: Secondary | ICD-10-CM | POA: Diagnosis not present

## 2022-03-17 DIAGNOSIS — N2581 Secondary hyperparathyroidism of renal origin: Secondary | ICD-10-CM | POA: Diagnosis not present

## 2022-03-17 DIAGNOSIS — Z992 Dependence on renal dialysis: Secondary | ICD-10-CM | POA: Diagnosis not present

## 2022-03-17 MED ORDER — AMIODARONE HCL 200 MG PO TABS: 200.0000 mg | ORAL_TABLET | Freq: Two times a day (BID) | ORAL | 3 refills | Status: DC

## 2022-03-17 NOTE — Telephone Encounter (Signed)
*  STAT* If patient is at the pharmacy, call can be transferred to refill team.   1. Which medications need to be refilled? (please list name of each medication and dose if known)  amiodarone (PACERONE) 200 MG tablet  2. Which pharmacy/location (including street and city if local pharmacy) is medication to be sent to? [place], [place] - [address]   3. Do they need a 30 day or 90 day supply?  90 day supply  Patient has been out of medication since 9/21

## 2022-03-17 NOTE — Telephone Encounter (Signed)
Refill of Amiodarone 200 mg sent to [facility].

## 2022-03-21 DIAGNOSIS — Z992 Dependence on renal dialysis: Secondary | ICD-10-CM | POA: Diagnosis not present

## 2022-03-21 DIAGNOSIS — N2581 Secondary hyperparathyroidism of renal origin: Secondary | ICD-10-CM | POA: Diagnosis not present

## 2022-03-21 DIAGNOSIS — N179 Acute kidney failure, unspecified: Secondary | ICD-10-CM | POA: Diagnosis not present

## 2022-03-24 DIAGNOSIS — N186 End stage renal disease: Secondary | ICD-10-CM | POA: Diagnosis present

## 2022-03-24 DIAGNOSIS — Z992 Dependence on renal dialysis: Secondary | ICD-10-CM | POA: Diagnosis not present

## 2022-03-24 DIAGNOSIS — N2581 Secondary hyperparathyroidism of renal origin: Secondary | ICD-10-CM | POA: Diagnosis not present

## 2022-03-28 DIAGNOSIS — Z992 Dependence on renal dialysis: Secondary | ICD-10-CM | POA: Diagnosis not present

## 2022-03-28 DIAGNOSIS — N2581 Secondary hyperparathyroidism of renal origin: Secondary | ICD-10-CM | POA: Diagnosis not present

## 2022-03-28 DIAGNOSIS — N186 End stage renal disease: Secondary | ICD-10-CM | POA: Diagnosis not present

## 2022-03-31 DIAGNOSIS — N186 End stage renal disease: Secondary | ICD-10-CM | POA: Diagnosis not present

## 2022-03-31 DIAGNOSIS — Z992 Dependence on renal dialysis: Secondary | ICD-10-CM | POA: Diagnosis not present

## 2022-03-31 DIAGNOSIS — N2581 Secondary hyperparathyroidism of renal origin: Secondary | ICD-10-CM | POA: Diagnosis not present

## 2022-04-01 DIAGNOSIS — Z992 Dependence on renal dialysis: Secondary | ICD-10-CM | POA: Diagnosis not present

## 2022-04-01 DIAGNOSIS — I2510 Atherosclerotic heart disease of native coronary artery without angina pectoris: Secondary | ICD-10-CM | POA: Diagnosis not present

## 2022-04-01 DIAGNOSIS — I5020 Unspecified systolic (congestive) heart failure: Secondary | ICD-10-CM | POA: Diagnosis not present

## 2022-04-01 DIAGNOSIS — E782 Mixed hyperlipidemia: Secondary | ICD-10-CM | POA: Diagnosis not present

## 2022-04-01 DIAGNOSIS — N186 End stage renal disease: Secondary | ICD-10-CM | POA: Diagnosis not present

## 2022-04-01 DIAGNOSIS — E1140 Type 2 diabetes mellitus with diabetic neuropathy, unspecified: Secondary | ICD-10-CM | POA: Diagnosis not present

## 2022-04-01 DIAGNOSIS — I4891 Unspecified atrial fibrillation: Secondary | ICD-10-CM | POA: Diagnosis not present

## 2022-04-04 DIAGNOSIS — N186 End stage renal disease: Secondary | ICD-10-CM | POA: Diagnosis not present

## 2022-04-04 DIAGNOSIS — N2581 Secondary hyperparathyroidism of renal origin: Secondary | ICD-10-CM | POA: Diagnosis not present

## 2022-04-04 DIAGNOSIS — Z992 Dependence on renal dialysis: Secondary | ICD-10-CM | POA: Diagnosis not present

## 2022-04-07 DIAGNOSIS — Z7985 Long-term (current) use of injectable non-insulin antidiabetic drugs: Secondary | ICD-10-CM | POA: Diagnosis not present

## 2022-04-07 DIAGNOSIS — R000 Tachycardia, unspecified: Secondary | ICD-10-CM | POA: Diagnosis not present

## 2022-04-07 DIAGNOSIS — Z992 Dependence on renal dialysis: Secondary | ICD-10-CM | POA: Diagnosis not present

## 2022-04-07 DIAGNOSIS — R42 Dizziness and giddiness: Secondary | ICD-10-CM | POA: Diagnosis not present

## 2022-04-07 DIAGNOSIS — E1142 Type 2 diabetes mellitus with diabetic polyneuropathy: Secondary | ICD-10-CM | POA: Diagnosis not present

## 2022-04-07 DIAGNOSIS — Z7901 Long term (current) use of anticoagulants: Secondary | ICD-10-CM | POA: Diagnosis not present

## 2022-04-07 DIAGNOSIS — M545 Low back pain, unspecified: Secondary | ICD-10-CM | POA: Diagnosis not present

## 2022-04-07 DIAGNOSIS — R600 Localized edema: Secondary | ICD-10-CM | POA: Diagnosis not present

## 2022-04-07 DIAGNOSIS — Z87891 Personal history of nicotine dependence: Secondary | ICD-10-CM | POA: Diagnosis not present

## 2022-04-07 DIAGNOSIS — D649 Anemia, unspecified: Secondary | ICD-10-CM | POA: Diagnosis not present

## 2022-04-07 DIAGNOSIS — R195 Other fecal abnormalities: Secondary | ICD-10-CM | POA: Diagnosis not present

## 2022-04-10 ENCOUNTER — Encounter (HOSPITAL_COMMUNITY): Payer: Medicare HMO

## 2022-04-11 DIAGNOSIS — Z992 Dependence on renal dialysis: Secondary | ICD-10-CM | POA: Diagnosis not present

## 2022-04-11 DIAGNOSIS — N2581 Secondary hyperparathyroidism of renal origin: Secondary | ICD-10-CM | POA: Diagnosis not present

## 2022-04-11 DIAGNOSIS — N186 End stage renal disease: Secondary | ICD-10-CM | POA: Diagnosis not present

## 2022-04-12 DIAGNOSIS — D649 Anemia, unspecified: Secondary | ICD-10-CM | POA: Diagnosis not present

## 2022-04-14 DIAGNOSIS — N2581 Secondary hyperparathyroidism of renal origin: Secondary | ICD-10-CM | POA: Diagnosis not present

## 2022-04-14 DIAGNOSIS — Z992 Dependence on renal dialysis: Secondary | ICD-10-CM | POA: Diagnosis not present

## 2022-04-14 DIAGNOSIS — N186 End stage renal disease: Secondary | ICD-10-CM | POA: Diagnosis not present

## 2022-04-17 ENCOUNTER — Encounter: Payer: Self-pay | Admitting: *Deleted

## 2022-04-17 ENCOUNTER — Ambulatory Visit: Payer: Medicare HMO | Attending: Cardiology | Admitting: Cardiology

## 2022-04-17 ENCOUNTER — Encounter: Payer: Self-pay | Admitting: Cardiology

## 2022-04-17 VITALS — BP 144/62 | HR 81 | Ht 70.0 in | Wt 263.0 lb

## 2022-04-17 DIAGNOSIS — Z01812 Encounter for preprocedural laboratory examination: Secondary | ICD-10-CM

## 2022-04-17 DIAGNOSIS — D6869 Other thrombophilia: Secondary | ICD-10-CM

## 2022-04-17 DIAGNOSIS — I4819 Other persistent atrial fibrillation: Secondary | ICD-10-CM | POA: Diagnosis not present

## 2022-04-17 DIAGNOSIS — D649 Anemia, unspecified: Secondary | ICD-10-CM

## 2022-04-17 DIAGNOSIS — I484 Atypical atrial flutter: Secondary | ICD-10-CM

## 2022-04-17 HISTORY — DX: Anemia, unspecified: D64.9

## 2022-04-17 NOTE — Patient Instructions (Addendum)
Medication Instructions:  Your physician recommends that you continue on your current medications as directed. Please refer to the Current Medication list given to you today.  *If you need a refill on your cardiac medications before your next appointment, please call your pharmacy*   Lab Work: Today: BMET & CBC  If you have labs (blood work) drawn today and your tests are completely normal, you will receive your results only by: [place] (if you have MyChart) OR A paper copy in the mail If you have any lab test that is abnormal or we need to change your treatment, we will call you to review the results.   Testing/Procedures: None ordered   Follow-Up: At [facility], you and your health needs are our priority.  As part of our continuing mission to provide you with exceptional heart care, we have created designated Provider Care Teams.  These Care Teams include your primary Cardiologist (physician) and Advanced Practice Providers (APPs -  Physician Assistants and Nurse Practitioners) who all work together to provide you with the care you need, when you need it.  We recommend signing up for the patient portal called "MyChart".  Sign up information is provided on this After Visit Summary.  MyChart is used to connect with patients for Virtual Visits (Telemedicine).  Patients are able to view lab/test results, encounter notes, upcoming appointments, etc.  Non-urgent messages can be sent to your provider as well.   To learn more about what you can do with MyChart, go to [email or web address].    Your next appointment:   3 week(s)  The format for your next appointment:   In Person  Provider:   Shirlee More, MD   Your physician wants you to follow-up in: 6 months with Dr. Curt Bears.  You will receive a reminder letter in the mail two months in advance. If you don't receive a letter, please call our office to schedule the follow-up appointment.     Thank you for choosing [facility]!!   Trinidad Curet, RN [phone]  Other Instructions    Important Information About Sugar          2

## 2022-04-17 NOTE — Progress Notes (Signed)
Electrophysiology Office Note   Date:  04/17/2022   ID:  [name], [name] [birth year], MRN [id number]  PCP:  Cyndi Bender, PA-C  Cardiologist:  Bettina Gavia Primary Electrophysiologist:  Burnett Lieber Meredith Leeds, MD    Chief Complaint: AF   History of Present Illness: [name] [name] is a 66 y.o. male who is being seen today for the evaluation of AF at the request of Cyndi Bender, [place]. Presenting today for electrophysiology evaluation.  He has a history significant for atrial fibrillation, prior cardiomyopathy with normalization of his ejection fraction, CKD, sick sinus syndrome.  He was found to be in atrial fibrillation 05/09/2021.  He is initially scheduled for ablation but his preablation CT showed elevated coronary calcium score.  He had a left heart catheterization that showed severe three-vessel disease and is now status post CABG/surgical maze.  He had a recent cardioversion 02/27/2022.  Postop from his CABG, he had acute renal failure and is now on dialysis.  Today, denies symptoms of palpitations, chest pain, shortness of breath, orthopnea, PND, lower extremity edema, claudication, dizziness, presyncope, syncope, bleeding, or neurologic sequela. The patient is tolerating medications without difficulties.  He feels weak and fatigued.  He feels that this could be due to his kidney disease and anemia, but it could also be due to the atrial flutter that he is in.  He states that he is usually able to do all of his daily activities, but has been having to do them more slowly.    Past Medical History:  Diagnosis Date   Atherosclerotic heart disease    Benign essential HTN 07/30/2014   Bradycardia 01/25/2015   CHF (congestive heart failure) (HCC)    Chronic anticoagulation 04/08/5101   Chronic systolic CHF (congestive heart failure), NYHA class 3 ([place]) 08/30/2014   Overview:  Global ef 30%   CKD (chronic kidney disease)    Class 3 severe obesity in adult [facility]) 04/28/2017   Coronary  artery disease    Diabetes mellitus without complication ([place])    Diabetic neuropathy ([place]) 07/28/2014   Dysrhythmia    Erectile dysfunction    High risk medication use 05/26/2018   Hypertensive heart disease with heart failure ([place]) 07/30/2014   Leukocytosis 11/26/2021   Lumbago    LV dysfunction 04/28/2017   Mixed hyperlipidemia 07/28/2014   OSA (obstructive sleep apnea)    CPAP   Paroxysmal atrial fibrillation ([place]) 07/28/2014   Postop check 01/06/2022   S/P CABG x 3 11/12/2021   Sinus node dysfunction ([place]) 04/21/2016   Testicular hypofunction    Uncontrolled type 2 diabetes mellitus with microalbuminuric diabetic nephropathy 07/28/2014   Past Surgical History:  Procedure Laterality Date   BUBBLE STUDY  11/21/2021   Procedure: BUBBLE STUDY;  Surgeon: Larey Dresser, MD;  Location: [place];  Service: Cardiovascular;;   CARDIOVERSION N/A 11/21/2021   Procedure: CARDIOVERSION;  Surgeon: Larey Dresser, MD;  Location: [facility] ENDOSCOPY;  Service: Cardiovascular;  Laterality: N/A;   CARDIOVERSION N/A 11/25/2021   Procedure: CARDIOVERSION;  Surgeon: Larey Dresser, MD;  Location: [facility] ENDOSCOPY;  Service: Cardiovascular;  Laterality: N/A;   CARDIOVERSION N/A 11/29/2021   Procedure: CARDIOVERSION;  Surgeon: Larey Dresser, MD;  Location: [facility] ENDOSCOPY;  Service: Cardiovascular;  Laterality: N/A;   CARDIOVERSION N/A 02/27/2022   Procedure: CARDIOVERSION;  Surgeon: Donato Heinz, MD;  Location: [facility] ENDOSCOPY;  Service: Cardiovascular;  Laterality: N/A;   CORONARY ARTERY BYPASS GRAFT N/A 11/12/2021   Procedure: CORONARY ARTERY BYPASS GRAFTING (CABG) x  3 USING  LEFT INTERNAL MAMMARY ARTERY AND RIGHT GREATER SAPHENOUS VEIN;  Surgeon: Dahlia Byes, MD;  Location: [place];  Service: Open Heart Surgery;  Laterality: N/A;   ELECTROPHYSIOLOGIC STUDY N/A 01/18/2015   Procedure: CARDIOVERSION;  Surgeon: [name], MD;  Location: [facility] ORS;  Service: Cardiovascular;  Laterality: N/A;    ENDOVEIN HARVEST OF GREATER SAPHENOUS VEIN  11/12/2021   Procedure: ENDOVEIN HARVEST OF GREATER SAPHENOUS VEIN;  Surgeon: Dahlia Byes, MD;  Location: [place];  Service: Open Heart Surgery;;   EYE SURGERY Bilateral 2022   IR FLUORO GUIDE CV LINE RIGHT  11/28/2021   IR US GUIDE VASC ACCESS RIGHT  11/28/2021   LEFT HEART CATH AND CORONARY ANGIOGRAPHY N/A 10/24/2021   Procedure: LEFT HEART CATH AND CORONARY ANGIOGRAPHY;  Surgeon: Jettie Booze, MD;  Location: [place] CV LAB;  Service: Cardiovascular;  Laterality: N/A;   MAZE N/A 11/12/2021   Procedure: MAZE;  Surgeon: Dahlia Byes, MD;  Location: [place];  Service: Open Heart Surgery;  Laterality: N/A;   TEE WITHOUT CARDIOVERSION N/A 11/12/2021   Procedure: TRANSESOPHAGEAL ECHOCARDIOGRAM (TEE);  Surgeon: Dahlia Byes, MD;  Location: [place];  Service: Open Heart Surgery;  Laterality: N/A;   TEE WITHOUT CARDIOVERSION N/A 11/21/2021   Procedure: TRANSESOPHAGEAL ECHOCARDIOGRAM (TEE);  Surgeon: Larey Dresser, MD;  Location: [facility] ENDOSCOPY;  Service: Cardiovascular;  Laterality: N/A;     Current Outpatient Medications  Medication Sig Dispense Refill   albuterol (VENTOLIN HFA) 108 (90 Base) MCG/ACT inhaler Inhale 2 puffs into the lungs every 6 (six) hours as needed for wheezing or shortness of breath.     amiodarone (PACERONE) 200 MG tablet Take 1 tablet (200 mg total) by mouth 2 (two) times daily. 180 tablet 3   aspirin EC 81 MG tablet Take 1 tablet (81 mg total) by mouth daily. Swallow whole. 30 tablet 12   atorvastatin (LIPITOR) 80 MG tablet Take 1 tablet (80 mg total) by mouth daily. 90 tablet 3   B Complex-C-Folic Acid (DIALYVITE 884) 0.8 MG TABS Take 1 tablet by mouth daily.     Dulaglutide (TRULICITY) 3 ZY/6.0YT SOPN Inject 3 mg into the skin every Monday.     ELIQUIS 5 MG TABS tablet TAKE 1 TABLET TWICE DAILY 180 tablet 1   fluticasone furoate-vilanterol (BREO ELLIPTA) 200-25 MCG/ACT AEPB Inhale 1 puff into the lungs daily.  3    insulin detemir (LEVEMIR) 100 UNIT/ML FlexPen Inject 30 Units into the skin 2 (two) times daily. (Patient taking differently: Inject 40 Units into the skin 2 (two) times daily.) 15 mL 11   Insulin Pen Needle 32G X 4 MM MISC Use to inject Levemir 2 (two) times daily. 100 each 1   metoprolol succinate (TOPROL-XL) 25 MG 24 hr tablet Take 25 mg by mouth daily.     midodrine (PROAMATINE) 5 MG tablet Take 1 tablet (5 mg total) by mouth 3 (three) times daily with meals. (Patient taking differently: Take 5 mg by mouth 2 (two) times daily with a meal.) 270 tablet 3   torsemide (DEMADEX) 20 MG tablet Take 20 mg by mouth daily.     traMADol (ULTRAM) 50 MG tablet Take 1 tablet (50 mg total) by mouth every 12 (twelve) hours as needed for moderate pain. 24 tablet 0   No current facility-administered medications for this visit.    Allergies:   Patient has no known allergies.   Social History:  The patient  reports that he quit smoking about 16 years ago. His smoking  use included cigarettes. He has never used smokeless tobacco. He reports current drug use. Frequency: 1.00 time per week. Drug: Marijuana. He reports that he does not drink alcohol.   Family History:  The patient's family history includes CAD in his brother and mother; Drug abuse in his brother; Lung cancer in his father.   ROS:  Please see the history of present illness.   Otherwise, review of systems is positive for none.   All other systems are reviewed and negative.   PHYSICAL EXAM: VS:  BP (!) 144/62   Pulse 81   Ht 5\' 10"  (1.778 m)   Wt 263 lb (119.3 kg)   BMI 37.74 kg/m  , BMI Body mass index is 37.74 kg/m. GEN: Well nourished, well developed, in no acute distress  HEENT: normal  Neck: no JVD, carotid bruits, or masses Cardiac: irregular; no murmurs, rubs, or gallops,no edema  Respiratory:  clear to auscultation bilaterally, normal work of breathing GI: soft, nontender, nondistended, + BS MS: no deformity or atrophy  Skin: warm  and dry Neuro:  Strength and sensation are intact Psych: euthymic mood, full affect  EKG:  EKG is ordered today. Personal review of the ekg ordered shows atrial flutter, right bundle branch block  Recent Labs: 11/18/2021: ALT 9 12/04/2021: Magnesium 2.1 02/18/2022: BUN 37; Creatinine, Ser 3.63; Hemoglobin 8.8; Platelets 288; Potassium 3.9; Sodium 140    Lipid Panel  No results found for: "CHOL", "TRIG", "HDL", "CHOLHDL", "VLDL", "LDLCALC", "LDLDIRECT"   Wt Readings from Last 3 Encounters:  04/17/22 263 lb (119.3 kg)  03/05/22 256 lb 3.2 oz (116.2 kg)  02/18/22 259 lb (117.5 kg)      Other studies Reviewed: Additional studies/ records that were reviewed today include: TTE 07/30/21  Review of the above records today demonstrates:   1. Left ventricular ejection fraction, by estimation, is 50 to 55%. The  left ventricle has low normal function. Left ventricular endocardial  border not optimally defined to evaluate regional wall motion. Left  ventricular diastolic parameters are  indeterminate.   2. Right ventricular systolic function was not well visualized. The right  ventricular size is normal.   3. The mitral valve is degenerative. No evidence of mitral valve  regurgitation. No evidence of mitral stenosis.   4. The aortic valve has an indeterminant number of cusps. Aortic valve  regurgitation is not visualized. Aortic valve sclerosis/calcification is  present, without any evidence of aortic stenosis.    ASSESSMENT AND PLAN:  1.  Persistent atrial fibrillation/flutter: Currently on Eliquis 5 mg twice daily, amiodarone 200 mg daily.  Post surgical maze currently on on dialysis and no other medications possible for management.  He is unfortunately in atrial flutter today.  He feels weak and fatigued.  He has been taking his amiodarone.  We Zitlaly Malson plan for repeat cardioversion.  If he does not stay in sinus rhythm, this may be a failure of amiodarone.  He would potentially be an  ablation candidate if he does get off of dialysis.  2.  CKD stage IIIb: Now on dialysis due to acute kidney injury.  Potentially a short-term issue.  Continue with plan per nephrology.  3.  Coronary artery disease: Status post CABG.  No current chest pain.  Plan per primary cardiology.  4.  Hypertension: Mildly elevated but usually well controlled.  5.  Secondary hypercoagulable state: Currently on Eliquis for atrial fibrillation as above.  .  Obstructive sleep apnea: CPAP compliance encouraged   Case discussed with primary  cardiology  Current medicines are reviewed at length with the patient today.   The patient does not have concerns regarding his medicines.  The following changes were made today: None  Labs/ tests ordered today include:  Orders Placed This Encounter  Procedures   EKG 12-Lead     Disposition:   FU with Ayan [place] 6 months  Signed, Addalee Kavanagh Meredith Leeds, MD  04/17/2022 2:32 PM     [place] [address] [place] [address] [phone] (office) [phone] (fax)

## 2022-04-19 LAB — CBC
Hematocrit: 25.3 % — ABNORMAL LOW (ref 37.5–51.0)
Hemoglobin: 7.6 g/dL — ABNORMAL LOW (ref 13.0–17.7)
MCH: 26.5 pg — ABNORMAL LOW (ref 26.6–33.0)
MCHC: 30 g/dL — ABNORMAL LOW (ref 31.5–35.7)
MCV: 88 fL (ref 79–97)
Platelets: 351 10*3/uL (ref 150–450)
RBC: 2.87 x10E6/uL — ABNORMAL LOW (ref 4.14–5.80)
RDW: 15.5 % — ABNORMAL HIGH (ref 11.6–15.4)
WBC: 7.8 10*3/uL (ref 3.4–10.8)

## 2022-04-19 LAB — BASIC METABOLIC PANEL
BUN/Creatinine Ratio: 11 (ref 10–24)
BUN: 42 mg/dL — ABNORMAL HIGH (ref 8–27)
CO2: 21 mmol/L (ref 20–29)
Calcium: 8.7 mg/dL (ref 8.6–10.2)
Chloride: 101 mmol/L (ref 96–106)
Creatinine, Ser: 3.66 mg/dL — ABNORMAL HIGH (ref 0.76–1.27)
Glucose: 245 mg/dL — ABNORMAL HIGH (ref 70–99)
Potassium: 4.3 mmol/L (ref 3.5–5.2)
Sodium: 139 mmol/L (ref 134–144)
eGFR: 17 mL/min/{1.73_m2} — ABNORMAL LOW (ref >59)

## 2022-04-21 ENCOUNTER — Other Ambulatory Visit: Payer: Self-pay | Admitting: *Deleted

## 2022-04-21 DIAGNOSIS — N186 End stage renal disease: Secondary | ICD-10-CM | POA: Diagnosis not present

## 2022-04-21 DIAGNOSIS — N183 Chronic kidney disease, stage 3 unspecified: Secondary | ICD-10-CM

## 2022-04-21 DIAGNOSIS — N2581 Secondary hyperparathyroidism of renal origin: Secondary | ICD-10-CM | POA: Diagnosis not present

## 2022-04-21 DIAGNOSIS — Z992 Dependence on renal dialysis: Secondary | ICD-10-CM | POA: Diagnosis not present

## 2022-04-22 DIAGNOSIS — N179 Acute kidney failure, unspecified: Secondary | ICD-10-CM | POA: Diagnosis not present

## 2022-04-22 DIAGNOSIS — N186 End stage renal disease: Secondary | ICD-10-CM | POA: Diagnosis not present

## 2022-04-22 DIAGNOSIS — Z992 Dependence on renal dialysis: Secondary | ICD-10-CM | POA: Diagnosis not present

## 2022-04-23 ENCOUNTER — Encounter: Payer: Self-pay | Admitting: Vascular Surgery

## 2022-04-23 ENCOUNTER — Ambulatory Visit (INDEPENDENT_AMBULATORY_CARE_PROVIDER_SITE_OTHER)
Admission: RE | Admit: 2022-04-23 | Discharge: 2022-04-23 | Disposition: A | Discharge status: Home / Self Care | Payer: Medicare HMO | Source: Ambulatory Visit | Attending: Vascular Surgery | Admitting: Vascular Surgery

## 2022-04-23 ENCOUNTER — Ambulatory Visit (HOSPITAL_COMMUNITY)
Admission: RE | Admit: 2022-04-23 | Discharge: 2022-04-23 | Disposition: A | Discharge status: Home / Self Care | Payer: Medicare HMO | Source: Ambulatory Visit | Attending: Vascular Surgery | Admitting: Vascular Surgery

## 2022-04-23 ENCOUNTER — Ambulatory Visit (INDEPENDENT_AMBULATORY_CARE_PROVIDER_SITE_OTHER): Payer: Medicare HMO | Admitting: Vascular Surgery

## 2022-04-23 VITALS — BP 145/84 | HR 71 | Temp 98.4°F | Resp 20 | Ht 70.0 in | Wt 259.0 lb

## 2022-04-23 DIAGNOSIS — N186 End stage renal disease: Secondary | ICD-10-CM

## 2022-04-23 DIAGNOSIS — N183 Chronic kidney disease, stage 3 unspecified: Secondary | ICD-10-CM | POA: Diagnosis not present

## 2022-04-23 NOTE — Progress Notes (Signed)
Patient ID: [name] [name], male   DOB: 29-Jul-1955, 66 y.o.   MRN: [id number]  Reason for Consult: No chief complaint on file.   Referred by Madelon Lips, MD  Subjective:     HPI:  [name] [name] is a 66 y.o. male history of end-stage renal disease currently dialyzing only Mondays and Fridays via tunneled dialysis catheter.  He is also chronically anticoagulated for atrial fibrillation with plans for cardioversion tomorrow.  He is left-hand dominant denies any upper extremity procedures in the past.  Past Medical History:  Diagnosis Date   Atherosclerotic heart disease    Benign essential HTN 07/30/2014   Bradycardia 01/25/2015   CHF (congestive heart failure) (HCC)    Chronic anticoagulation 56/43/3295   Chronic systolic CHF (congestive heart failure), NYHA class 3 ([place]) 08/30/2014   Overview:  Global ef 30%   CKD (chronic kidney disease)    Class 3 severe obesity in adult [facility]) 04/28/2017   Coronary artery disease    Diabetes mellitus without complication (HCC)    Diabetic neuropathy ([place]) 07/28/2014   Dysrhythmia    Erectile dysfunction    High risk medication use 05/26/2018   Hypertensive heart disease with heart failure ([place]) 07/30/2014   Leukocytosis 11/26/2021   Lumbago    LV dysfunction 04/28/2017   Mixed hyperlipidemia 07/28/2014   OSA (obstructive sleep apnea)    CPAP   Paroxysmal atrial fibrillation ([place]) 07/28/2014   Postop check 01/06/2022   S/P CABG x 3 11/12/2021   Sinus node dysfunction ([place]) 04/21/2016   Testicular hypofunction    Uncontrolled type 2 diabetes mellitus with microalbuminuric diabetic nephropathy 07/28/2014   Family History  Problem Relation Age of Onset   CAD Brother    Drug abuse Brother    Lung cancer Father    CAD Mother    Past Surgical History:  Procedure Laterality Date   BUBBLE STUDY  11/21/2021   Procedure: BUBBLE STUDY;  Surgeon: Larey Dresser, MD;  Location: [place];  Service: Cardiovascular;;   CARDIOVERSION N/A  11/21/2021   Procedure: CARDIOVERSION;  Surgeon: Larey Dresser, MD;  Location: [place];  Service: Cardiovascular;  Laterality: N/A;   CARDIOVERSION N/A 11/25/2021   Procedure: CARDIOVERSION;  Surgeon: Larey Dresser, MD;  Location: [facility] ENDOSCOPY;  Service: Cardiovascular;  Laterality: N/A;   CARDIOVERSION N/A 11/29/2021   Procedure: CARDIOVERSION;  Surgeon: Larey Dresser, MD;  Location: [place];  Service: Cardiovascular;  Laterality: N/A;   CARDIOVERSION N/A 02/27/2022   Procedure: CARDIOVERSION;  Surgeon: Donato Heinz, MD;  Location: [facility] ENDOSCOPY;  Service: Cardiovascular;  Laterality: N/A;   CORONARY ARTERY BYPASS GRAFT N/A 11/12/2021   Procedure: CORONARY ARTERY BYPASS GRAFTING (CABG) x  3 USING LEFT INTERNAL MAMMARY ARTERY AND RIGHT GREATER SAPHENOUS VEIN;  Surgeon: Dahlia Byes, MD;  Location: [place];  Service: Open Heart Surgery;  Laterality: N/A;   ELECTROPHYSIOLOGIC STUDY N/A 01/18/2015   Procedure: CARDIOVERSION;  Surgeon: [name], MD;  Location: [facility] ORS;  Service: Cardiovascular;  Laterality: N/A;   ENDOVEIN HARVEST OF GREATER SAPHENOUS VEIN  11/12/2021   Procedure: ENDOVEIN HARVEST OF GREATER SAPHENOUS VEIN;  Surgeon: Dahlia Byes, MD;  Location: [place];  Service: Open Heart Surgery;;   EYE SURGERY Bilateral 2022   IR FLUORO GUIDE CV LINE RIGHT  11/28/2021   IR US GUIDE VASC ACCESS RIGHT  11/28/2021   LEFT HEART CATH AND CORONARY ANGIOGRAPHY N/A 10/24/2021   Procedure: LEFT HEART CATH AND CORONARY ANGIOGRAPHY;  Surgeon: Jettie Booze,  MD;  Location: [place] CV LAB;  Service: Cardiovascular;  Laterality: N/A;   MAZE N/A 11/12/2021   Procedure: MAZE;  Surgeon: Dahlia Byes, MD;  Location: [place];  Service: Open Heart Surgery;  Laterality: N/A;   TEE WITHOUT CARDIOVERSION N/A 11/12/2021   Procedure: TRANSESOPHAGEAL ECHOCARDIOGRAM (TEE);  Surgeon: Dahlia Byes, MD;  Location: [place];  Service: Open Heart Surgery;  Laterality: N/A;   TEE WITHOUT  CARDIOVERSION N/A 11/21/2021   Procedure: TRANSESOPHAGEAL ECHOCARDIOGRAM (TEE);  Surgeon: Larey Dresser, MD;  Location: [facility] ENDOSCOPY;  Service: Cardiovascular;  Laterality: N/A;    Short Social History:  Social History   Tobacco Use   Smoking status: Former    Types: Cigarettes    Quit date: 2007    Years since quitting: 16.8   Smokeless tobacco: Never  Substance Use Topics   Alcohol use: No    No Known Allergies  Current Outpatient Medications  Medication Sig Dispense Refill   albuterol (VENTOLIN HFA) 108 (90 Base) MCG/ACT inhaler Inhale 2 puffs into the lungs every 6 (six) hours as needed for wheezing or shortness of breath.     amiodarone (PACERONE) 200 MG tablet Take 1 tablet (200 mg total) by mouth 2 (two) times daily. 180 tablet 3   aspirin EC 81 MG tablet Take 1 tablet (81 mg total) by mouth daily. Swallow whole. 30 tablet 12   atorvastatin (LIPITOR) 80 MG tablet Take 1 tablet (80 mg total) by mouth daily. 90 tablet 3   B Complex-C-Folic Acid (DIALYVITE 751) 0.8 MG TABS Take 1 tablet by mouth daily.     Dulaglutide (TRULICITY) 3 WC/5.8NI SOPN Inject 3 mg into the skin every Monday.     ELIQUIS 5 MG TABS tablet TAKE 1 TABLET TWICE DAILY 180 tablet 1   fluticasone furoate-vilanterol (BREO ELLIPTA) 200-25 MCG/ACT AEPB Inhale 1 puff into the lungs daily.  3   insulin detemir (LEVEMIR) 100 UNIT/ML FlexPen Inject 30 Units into the skin 2 (two) times daily. (Patient taking differently: Inject 40 Units into the skin 2 (two) times daily.) 15 mL 11   Insulin Pen Needle 32G X 4 MM MISC Use to inject Levemir 2 (two) times daily. 100 each 1   metoprolol succinate (TOPROL-XL) 25 MG 24 hr tablet Take 25 mg by mouth daily.     midodrine (PROAMATINE) 5 MG tablet Take 1 tablet (5 mg total) by mouth 3 (three) times daily with meals. (Patient taking differently: Take 5 mg by mouth 2 (two) times daily with a meal.) 270 tablet 3   torsemide (DEMADEX) 20 MG tablet Take 20 mg by mouth daily.      traMADol (ULTRAM) 50 MG tablet Take 1 tablet (50 mg total) by mouth every 12 (twelve) hours as needed for moderate pain. (Patient not taking: Reported on 04/21/2022) 24 tablet 0   No current facility-administered medications for this visit.    Review of Systems  Constitutional:  Constitutional negative. HENT: HENT negative.  Eyes: Eyes negative.  Respiratory: Respiratory negative.  Cardiovascular: Cardiovascular negative.  GI: Gastrointestinal negative.  Musculoskeletal: Musculoskeletal negative.  Skin: Skin negative.  Neurological: Neurological negative. Hematologic: Hematologic/lymphatic negative.  Psychiatric: Psychiatric negative.        Objective:  Objective  Vitals:   04/23/22 0926  BP: (!) 145/84  Pulse: 71  Resp: 20  Temp: 98.4 F (36.9 C)  SpO2: 94%     Physical Exam HENT:     Head: Normocephalic.     Nose: Nose normal.  Eyes:  Pupils: Pupils are equal, round, and reactive to light.  Cardiovascular:     Rate and Rhythm: Normal rate.     Pulses:          Radial pulses are 0 on the right side and 1+ on the left side.     Comments: 3+ bilateral brachial pulses Pulmonary:     Effort: Pulmonary effort is normal.  Abdominal:     General: Abdomen is flat.     Palpations: Abdomen is soft.  Musculoskeletal:     Right lower leg: No edema.     Left lower leg: No edema.  Skin:    General: Skin is warm and dry.     Capillary Refill: Capillary refill takes less than 2 seconds.  Neurological:     General: No focal deficit present.     Mental Status: He is alert.  Psychiatric:        Mood and Affect: Mood normal.        Thought Content: Thought content normal.     Data: Right Cephalic   Diameter (cm)Depth (cm)   Findings     +-----------------+-------------+----------+--------------+  Shoulder             0.45                               +-----------------+-------------+----------+--------------+  Prox upper arm       0.45                                +-----------------+-------------+----------+--------------+  Mid upper arm        0.43                               +-----------------+-------------+----------+--------------+  Dist upper arm       0.48                               +-----------------+-------------+----------+--------------+  Antecubital fossa    0.59                 branching     +-----------------+-------------+----------+--------------+  Prox forearm         0.28                 branching     +-----------------+-------------+----------+--------------+  Mid forearm          0.21                               +-----------------+-------------+----------+--------------+  Dist forearm         0.21                 branching     +-----------------+-------------+----------+--------------+  Wrist                                   not visualized  +-----------------+-------------+----------+--------------+   Right basilic vein not visualized  +-----------------+-------------+----------+--------------+  Left Cephalic    Diameter (cm)Depth (cm)   Findings     +-----------------+-------------+----------+--------------+  Shoulder             0.41                               +-----------------+-------------+----------+--------------+  Prox upper arm       0.41                               +-----------------+-------------+----------+--------------+  Mid upper arm        0.30                               +-----------------+-------------+----------+--------------+  Dist upper arm       0.26                               +-----------------+-------------+----------+--------------+  Antecubital fossa    0.23                               +-----------------+-------------+----------+--------------+  Prox forearm         0.24                               +-----------------+-------------+----------+--------------+  Mid forearm          0.18                  branching     +-----------------+-------------+----------+--------------+  Dist forearm         0.14                 branching     +-----------------+-------------+----------+--------------+  Wrist                                   not visualized  +-----------------+-------------+----------+--------------+   +-----------------+-------------+----------+--------------+  Left Basilic     Diameter (cm)Depth (cm)   Findings     +-----------------+-------------+----------+--------------+  Prox upper arm                          not visualized  +-----------------+-------------+----------+--------------+  Mid upper arm                           not visualized  +-----------------+-------------+----------+--------------+  Dist upper arm                          not visualized  +-----------------+-------------+----------+--------------+  Antecubital fossa    0.22                               +-----------------+-------------+----------+--------------+  Prox forearm         0.24                               +-----------------+-------------+----------+--------------+  Mid forearm                             not visualized  +-----------------+-------------+----------+--------------+  Distal forearm                          not visualized  +-----------------+-------------+----------+--------------+  Wrist  not visualized  +-----------------+-------------+----------+--------------+      Assessment/Plan:    66 year old male with end-stage renal disease on dialysis via catheter.  He is left-hand dominant.  Currently anticoagulated for atrial fibrillation with plan for cardioversion tomorrow.  He will need to be off Eliquis for 48 hours prior to our procedure and expected will need uninterrupted anticoagulation following his procedure tomorrow for some period of time and will need to wait and schedule  after that.  I discussed the risk and benefits as well as alternatives with the patient and we will plan for right arm AV fistula versus graft on a nondialysis day in the near future.     Waynetta Sandy MD [facility]

## 2022-04-23 NOTE — H&P (View-Only) (Signed)
Patient ID: [name] [name], male   DOB: 1955/09/04, 66 y.o.   MRN: [id number]  Reason for Consult: No chief complaint on file.   Referred by [name] Lips, MD  Subjective:     HPI:  [name] [name] is a 65 y.o. male history of end-stage renal disease currently dialyzing only Mondays and Fridays via tunneled dialysis catheter.  He is also chronically anticoagulated for atrial fibrillation with plans for cardioversion tomorrow.  He is left-hand dominant denies any upper extremity procedures in the past.  Past Medical History:  Diagnosis Date   Atherosclerotic heart disease    Benign essential HTN 07/30/2014   Bradycardia 01/25/2015   CHF (congestive heart failure) (HCC)    Chronic anticoagulation 09/73/5329   Chronic systolic CHF (congestive heart failure), NYHA class 3 ([place]) 08/30/2014   Overview:  Global ef 30%   CKD (chronic kidney disease)    Class 3 severe obesity in adult [facility]) 04/28/2017   Coronary artery disease    Diabetes mellitus without complication (HCC)    Diabetic neuropathy ([place]) 07/28/2014   Dysrhythmia    Erectile dysfunction    High risk medication use 05/26/2018   Hypertensive heart disease with heart failure ([place]) 07/30/2014   Leukocytosis 11/26/2021   Lumbago    LV dysfunction 04/28/2017   Mixed hyperlipidemia 07/28/2014   OSA (obstructive sleep apnea)    CPAP   Paroxysmal atrial fibrillation ([place]) 07/28/2014   Postop check 01/06/2022   S/P CABG x 3 11/12/2021   Sinus node dysfunction ([place]) 04/21/2016   Testicular hypofunction    Uncontrolled type 2 diabetes mellitus with microalbuminuric diabetic nephropathy 07/28/2014   Family History  Problem Relation Age of Onset   CAD Brother    Drug abuse Brother    Lung cancer Father    CAD Mother    Past Surgical History:  Procedure Laterality Date   BUBBLE STUDY  11/21/2021   Procedure: BUBBLE STUDY;  Surgeon: Larey Dresser, MD;  Location: [place];  Service: Cardiovascular;;   CARDIOVERSION N/A  11/21/2021   Procedure: CARDIOVERSION;  Surgeon: Larey Dresser, MD;  Location: [place];  Service: Cardiovascular;  Laterality: N/A;   CARDIOVERSION N/A 11/25/2021   Procedure: CARDIOVERSION;  Surgeon: Larey Dresser, MD;  Location: [facility]COPY;  Service: Cardiovascular;  Laterality: N/A;   CARDIOVERSION N/A 11/29/2021   Procedure: CARDIOVERSION;  Surgeon: Larey Dresser, MD;  Location: [place];  Service: Cardiovascular;  Laterality: N/A;   CARDIOVERSION N/A 02/27/2022   Procedure: CARDIOVERSION;  Surgeon: Donato Heinz, MD;  Location: [facility]COPY;  Service: Cardiovascular;  Laterality: N/A;   CORONARY ARTERY BYPASS GRAFT N/A 11/12/2021   Procedure: CORONARY ARTERY BYPASS GRAFTING (CABG) x  3 USING LEFT INTERNAL MAMMARY ARTERY AND RIGHT GREATER SAPHENOUS VEIN;  Surgeon: Dahlia Byes, MD;  Location: [place];  Service: Open Heart Surgery;  Laterality: N/A;   ELECTROPHYSIOLOGIC STUDY N/A 01/18/2015   Procedure: CARDIOVERSION;  Surgeon: [name], MD;  Location: [facility]S;  Service: Cardiovascular;  Laterality: N/A;   ENDOVEIN HARVEST OF GREATER SAPHENOUS VEIN  11/12/2021   Procedure: ENDOVEIN HARVEST OF GREATER SAPHENOUS VEIN;  Surgeon: Dahlia Byes, MD;  Location: [place];  Service: Open Heart Surgery;;   EYE SURGERY Bilateral 2022   IR FLUORO GUIDE CV LINE RIGHT  11/28/2021   IR US GUIDE VASC ACCESS RIGHT  11/28/2021   LEFT HEART CATH AND CORONARY ANGIOGRAPHY N/A 10/24/2021   Procedure: LEFT HEART CATH AND CORONARY ANGIOGRAPHY;  Surgeon: Jettie Booze,  MD;  Location: [place] CV LAB;  Service: Cardiovascular;  Laterality: N/A;   MAZE N/A 11/12/2021   Procedure: MAZE;  Surgeon: Dahlia Byes, MD;  Location: [place];  Service: Open Heart Surgery;  Laterality: N/A;   TEE WITHOUT CARDIOVERSION N/A 11/12/2021   Procedure: TRANSESOPHAGEAL ECHOCARDIOGRAM (TEE);  Surgeon: Dahlia Byes, MD;  Location: [place];  Service: Open Heart Surgery;  Laterality: N/A;   TEE WITHOUT  CARDIOVERSION N/A 11/21/2021   Procedure: TRANSESOPHAGEAL ECHOCARDIOGRAM (TEE);  Surgeon: Larey Dresser, MD;  Location: [facility] ENDOSCOPY;  Service: Cardiovascular;  Laterality: N/A;    Short Social History:  Social History   Tobacco Use   Smoking status: Former    Types: Cigarettes    Quit date: 2007    Years since quitting: 16.8   Smokeless tobacco: Never  Substance Use Topics   Alcohol use: No    No Known Allergies  Current Outpatient Medications  Medication Sig Dispense Refill   albuterol (VENTOLIN HFA) 108 (90 Base) MCG/ACT inhaler Inhale 2 puffs into the lungs every 6 (six) hours as needed for wheezing or shortness of breath.     amiodarone (PACERONE) 200 MG tablet Take 1 tablet (200 mg total) by mouth 2 (two) times daily. 180 tablet 3   aspirin EC 81 MG tablet Take 1 tablet (81 mg total) by mouth daily. Swallow whole. 30 tablet 12   atorvastatin (LIPITOR) 80 MG tablet Take 1 tablet (80 mg total) by mouth daily. 90 tablet 3   B Complex-C-Folic Acid (DIALYVITE 814) 0.8 MG TABS Take 1 tablet by mouth daily.     Dulaglutide (TRULICITY) 3 GY/1.8HU SOPN Inject 3 mg into the skin every Monday.     ELIQUIS 5 MG TABS tablet TAKE 1 TABLET TWICE DAILY 180 tablet 1   fluticasone furoate-vilanterol (BREO ELLIPTA) 200-25 MCG/ACT AEPB Inhale 1 puff into the lungs daily.  3   insulin detemir (LEVEMIR) 100 UNIT/ML FlexPen Inject 30 Units into the skin 2 (two) times daily. (Patient taking differently: Inject 40 Units into the skin 2 (two) times daily.) 15 mL 11   Insulin Pen Needle 32G X 4 MM MISC Use to inject Levemir 2 (two) times daily. 100 each 1   metoprolol succinate (TOPROL-XL) 25 MG 24 hr tablet Take 25 mg by mouth daily.     midodrine (PROAMATINE) 5 MG tablet Take 1 tablet (5 mg total) by mouth 3 (three) times daily with meals. (Patient taking differently: Take 5 mg by mouth 2 (two) times daily with a meal.) 270 tablet 3   torsemide (DEMADEX) 20 MG tablet Take 20 mg by mouth daily.      traMADol (ULTRAM) 50 MG tablet Take 1 tablet (50 mg total) by mouth every 12 (twelve) hours as needed for moderate pain. (Patient not taking: Reported on 04/21/2022) 24 tablet 0   No current facility-administered medications for this visit.    Review of Systems  Constitutional:  Constitutional negative. HENT: HENT negative.  Eyes: Eyes negative.  Respiratory: Respiratory negative.  Cardiovascular: Cardiovascular negative.  GI: Gastrointestinal negative.  Musculoskeletal: Musculoskeletal negative.  Skin: Skin negative.  Neurological: Neurological negative. Hematologic: Hematologic/lymphatic negative.  Psychiatric: Psychiatric negative.        Objective:  Objective  Vitals:   04/23/22 0926  BP: (!) 145/84  Pulse: 71  Resp: 20  Temp: 98.4 F (36.9 C)  SpO2: 94%     Physical Exam HENT:     Head: Normocephalic.     Nose: Nose normal.  Eyes:  Pupils: Pupils are equal, round, and reactive to light.  Cardiovascular:     Rate and Rhythm: Normal rate.     Pulses:          Radial pulses are 0 on the right side and 1+ on the left side.     Comments: 3+ bilateral brachial pulses Pulmonary:     Effort: Pulmonary effort is normal.  Abdominal:     General: Abdomen is flat.     Palpations: Abdomen is soft.  Musculoskeletal:     Right lower leg: No edema.     Left lower leg: No edema.  Skin:    General: Skin is warm and dry.     Capillary Refill: Capillary refill takes less than 2 seconds.  Neurological:     General: No focal deficit present.     Mental Status: He is alert.  Psychiatric:        Mood and Affect: Mood normal.        Thought Content: Thought content normal.     Data: Right Cephalic   Diameter (cm)Depth (cm)   Findings     +-----------------+-------------+----------+--------------+  Shoulder             0.45                               +-----------------+-------------+----------+--------------+  Prox upper arm       0.45                                +-----------------+-------------+----------+--------------+  Mid upper arm        0.43                               +-----------------+-------------+----------+--------------+  Dist upper arm       0.48                               +-----------------+-------------+----------+--------------+  Antecubital fossa    0.59                 branching     +-----------------+-------------+----------+--------------+  Prox forearm         0.28                 branching     +-----------------+-------------+----------+--------------+  Mid forearm          0.21                               +-----------------+-------------+----------+--------------+  Dist forearm         0.21                 branching     +-----------------+-------------+----------+--------------+  Wrist                                   not visualized  +-----------------+-------------+----------+--------------+   Right basilic vein not visualized  +-----------------+-------------+----------+--------------+  Left Cephalic    Diameter (cm)Depth (cm)   Findings     +-----------------+-------------+----------+--------------+  Shoulder             0.41                               +-----------------+-------------+----------+--------------+  Prox upper arm       0.41                               +-----------------+-------------+----------+--------------+  Mid upper arm        0.30                               +-----------------+-------------+----------+--------------+  Dist upper arm       0.26                               +-----------------+-------------+----------+--------------+  Antecubital fossa    0.23                               +-----------------+-------------+----------+--------------+  Prox forearm         0.24                               +-----------------+-------------+----------+--------------+  Mid forearm          0.18                  branching     +-----------------+-------------+----------+--------------+  Dist forearm         0.14                 branching     +-----------------+-------------+----------+--------------+  Wrist                                   not visualized  +-----------------+-------------+----------+--------------+   +-----------------+-------------+----------+--------------+  Left Basilic     Diameter (cm)Depth (cm)   Findings     +-----------------+-------------+----------+--------------+  Prox upper arm                          not visualized  +-----------------+-------------+----------+--------------+  Mid upper arm                           not visualized  +-----------------+-------------+----------+--------------+  Dist upper arm                          not visualized  +-----------------+-------------+----------+--------------+  Antecubital fossa    0.22                               +-----------------+-------------+----------+--------------+  Prox forearm         0.24                               +-----------------+-------------+----------+--------------+  Mid forearm                             not visualized  +-----------------+-------------+----------+--------------+  Distal forearm                          not visualized  +-----------------+-------------+----------+--------------+  Wrist  not visualized  +-----------------+-------------+----------+--------------+      Assessment/Plan:    66 year old male with end-stage renal disease on dialysis via catheter.  He is left-hand dominant.  Currently anticoagulated for atrial fibrillation with plan for cardioversion tomorrow.  He will need to be off Eliquis for 48 hours prior to our procedure and expected will need uninterrupted anticoagulation following his procedure tomorrow for some period of time and will need to wait and schedule  after that.  I discussed the risk and benefits as well as alternatives with the patient and we will plan for right arm AV fistula versus graft on a nondialysis day in the near future.     Waynetta Sandy MD [facility]

## 2022-04-24 ENCOUNTER — Ambulatory Visit (HOSPITAL_COMMUNITY)
Admission: RE | Admit: 2022-04-24 | Discharge: 2022-04-24 | Disposition: A | Discharge status: Home / Self Care | Payer: Medicare HMO | Attending: Cardiology | Admitting: Cardiology

## 2022-04-24 ENCOUNTER — Encounter (HOSPITAL_COMMUNITY)
Admission: RE | Disposition: A | Discharge status: Home / Self Care | Payer: Self-pay | Source: Home / Self Care | Attending: Cardiology

## 2022-04-24 DIAGNOSIS — I480 Paroxysmal atrial fibrillation: Secondary | ICD-10-CM

## 2022-04-24 DIAGNOSIS — Z538 Procedure and treatment not carried out for other reasons: Secondary | ICD-10-CM | POA: Insufficient documentation

## 2022-04-24 DIAGNOSIS — I50810 Right heart failure, unspecified: Secondary | ICD-10-CM

## 2022-04-24 DIAGNOSIS — I4891 Unspecified atrial fibrillation: Secondary | ICD-10-CM | POA: Insufficient documentation

## 2022-04-24 DIAGNOSIS — E291 Testicular hypofunction: Secondary | ICD-10-CM

## 2022-04-24 SURGERY — CANCELLED PROCEDURE
Anesthesia: Monitor Anesthesia Care

## 2022-04-24 MED ORDER — SODIUM CHLORIDE 0.9 % IV SOLN: INTRAVENOUS | Status: DC

## 2022-04-24 NOTE — Progress Notes (Signed)
Patient arrived to ENDO and placed on monitor. Patient appeared to be in NSR. EKG obtained and MD confirmed NSR. Patient discharged home with instructions to continue taking Eliquis and not miss a dose and follow up as needed with cardiologist. Patient showed understanding and was wheeled to front of hospital.

## 2022-04-25 ENCOUNTER — Other Ambulatory Visit: Payer: Self-pay | Admitting: *Deleted

## 2022-04-25 DIAGNOSIS — Z992 Dependence on renal dialysis: Secondary | ICD-10-CM | POA: Diagnosis not present

## 2022-04-25 DIAGNOSIS — N2581 Secondary hyperparathyroidism of renal origin: Secondary | ICD-10-CM | POA: Diagnosis not present

## 2022-04-25 DIAGNOSIS — N186 End stage renal disease: Secondary | ICD-10-CM

## 2022-04-28 DIAGNOSIS — N2581 Secondary hyperparathyroidism of renal origin: Secondary | ICD-10-CM | POA: Diagnosis not present

## 2022-04-28 DIAGNOSIS — Z992 Dependence on renal dialysis: Secondary | ICD-10-CM | POA: Diagnosis not present

## 2022-04-28 DIAGNOSIS — N186 End stage renal disease: Secondary | ICD-10-CM | POA: Diagnosis not present

## 2022-04-30 DIAGNOSIS — Z7689 Persons encountering health services in other specified circumstances: Secondary | ICD-10-CM | POA: Diagnosis not present

## 2022-04-30 DIAGNOSIS — Z5321 Procedure and treatment not carried out due to patient leaving prior to being seen by health care provider: Secondary | ICD-10-CM | POA: Diagnosis not present

## 2022-05-02 DIAGNOSIS — N2581 Secondary hyperparathyroidism of renal origin: Secondary | ICD-10-CM | POA: Diagnosis not present

## 2022-05-02 DIAGNOSIS — N186 End stage renal disease: Secondary | ICD-10-CM | POA: Diagnosis not present

## 2022-05-02 DIAGNOSIS — Z992 Dependence on renal dialysis: Secondary | ICD-10-CM | POA: Diagnosis not present

## 2022-05-05 ENCOUNTER — Telehealth: Payer: Self-pay

## 2022-05-05 DIAGNOSIS — N2581 Secondary hyperparathyroidism of renal origin: Secondary | ICD-10-CM | POA: Diagnosis not present

## 2022-05-05 DIAGNOSIS — Z992 Dependence on renal dialysis: Secondary | ICD-10-CM | POA: Diagnosis not present

## 2022-05-05 DIAGNOSIS — N186 End stage renal disease: Secondary | ICD-10-CM | POA: Diagnosis not present

## 2022-05-05 NOTE — Progress Notes (Deleted)
Cardiology Office Note:    Date:  05/05/2022   ID:  [name] [name], DOB 03-16-1956, MRN [id number]  PCP:  Cyndi Bender, PA-C  Cardiologist:  Shirlee More, MD    Referring MD: Cyndi Bender, PA-C    ASSESSMENT:    No diagnosis found. PLAN:    In order of problems listed above:  ***   Next appointment: ***   Medication Adjustments/Labs and Tests Ordered: Current medicines are reviewed at length with the patient today.  Concerns regarding medicines are outlined above.  No orders of the defined types were placed in this encounter.  No orders of the defined types were placed in this encounter.   No chief complaint on file.   History of Present Illness:    [name] [name] is a 66 y.o. male with a hx of paroxysmal atrial fibrillation maintaining sinus rhythm on amiodarone surgical maze procedure chronic anticoagulation progressive kidney disease stage V with renal replacement therapy awaiting fistula formation chronic anemia quiring transfusion CAD with CABG and hypertensive heart disease with heart failure last seen 03/05/2022. Compliance with diet, lifestyle and medications: *** Past Medical History:  Diagnosis Date   Atherosclerotic heart disease    Benign essential HTN 07/30/2014   Bradycardia 01/25/2015   CHF (congestive heart failure) (HCC)    Chronic anticoagulation 96/28/3662   Chronic systolic CHF (congestive heart failure), NYHA class 3 ([place]) 08/30/2014   Overview:  Global ef 30%   CKD (chronic kidney disease)    Class 3 severe obesity in adult [facility]) 04/28/2017   Coronary artery disease    Diabetes mellitus without complication ([place])    Diabetic neuropathy ([place]) 07/28/2014   Dysrhythmia    Erectile dysfunction    High risk medication use 05/26/2018   Hypertensive heart disease with heart failure ([place]) 07/30/2014   Leukocytosis 11/26/2021   Lumbago    LV dysfunction 04/28/2017   Mixed hyperlipidemia 07/28/2014   OSA (obstructive sleep apnea)    CPAP    Paroxysmal atrial fibrillation ([place]) 07/28/2014   Postop check 01/06/2022   S/P CABG x 3 11/12/2021   Sinus node dysfunction ([place]) 04/21/2016   Testicular hypofunction    Uncontrolled type 2 diabetes mellitus with microalbuminuric diabetic nephropathy 07/28/2014    Past Surgical History:  Procedure Laterality Date   BUBBLE STUDY  11/21/2021   Procedure: BUBBLE STUDY;  Surgeon: Larey Dresser, MD;  Location: [place];  Service: Cardiovascular;;   CARDIOVERSION N/A 11/21/2021   Procedure: CARDIOVERSION;  Surgeon: Larey Dresser, MD;  Location: [facility] ENDOSCOPY;  Service: Cardiovascular;  Laterality: N/A;   CARDIOVERSION N/A 11/25/2021   Procedure: CARDIOVERSION;  Surgeon: Larey Dresser, MD;  Location: [facility] ENDOSCOPY;  Service: Cardiovascular;  Laterality: N/A;   CARDIOVERSION N/A 11/29/2021   Procedure: CARDIOVERSION;  Surgeon: Larey Dresser, MD;  Location: [facility] ENDOSCOPY;  Service: Cardiovascular;  Laterality: N/A;   CARDIOVERSION N/A 02/27/2022   Procedure: CARDIOVERSION;  Surgeon: Donato Heinz, MD;  Location: [facility] ENDOSCOPY;  Service: Cardiovascular;  Laterality: N/A;   CORONARY ARTERY BYPASS GRAFT N/A 11/12/2021   Procedure: CORONARY ARTERY BYPASS GRAFTING (CABG) x  3 USING LEFT INTERNAL MAMMARY ARTERY AND RIGHT GREATER SAPHENOUS VEIN;  Surgeon: Dahlia Byes, MD;  Location: [place];  Service: Open Heart Surgery;  Laterality: N/A;   ELECTROPHYSIOLOGIC STUDY N/A 01/18/2015   Procedure: CARDIOVERSION;  Surgeon: [name], MD;  Location: [facility] ORS;  Service: Cardiovascular;  Laterality: N/A;   ENDOVEIN HARVEST OF GREATER SAPHENOUS VEIN  11/12/2021   Procedure: ENDOVEIN  HARVEST OF GREATER SAPHENOUS VEIN;  Surgeon: Dahlia Byes, MD;  Location: [place];  Service: Open Heart Surgery;;   EYE SURGERY Bilateral 2022   IR FLUORO GUIDE CV LINE RIGHT  11/28/2021   IR US GUIDE VASC ACCESS RIGHT  11/28/2021   LEFT HEART CATH AND CORONARY ANGIOGRAPHY N/A 10/24/2021   Procedure: LEFT HEART CATH  AND CORONARY ANGIOGRAPHY;  Surgeon: Jettie Booze, MD;  Location: [place] CV LAB;  Service: Cardiovascular;  Laterality: N/A;   MAZE N/A 11/12/2021   Procedure: MAZE;  Surgeon: Dahlia Byes, MD;  Location: [place];  Service: Open Heart Surgery;  Laterality: N/A;   TEE WITHOUT CARDIOVERSION N/A 11/12/2021   Procedure: TRANSESOPHAGEAL ECHOCARDIOGRAM (TEE);  Surgeon: Dahlia Byes, MD;  Location: [place];  Service: Open Heart Surgery;  Laterality: N/A;   TEE WITHOUT CARDIOVERSION N/A 11/21/2021   Procedure: TRANSESOPHAGEAL ECHOCARDIOGRAM (TEE);  Surgeon: Larey Dresser, MD;  Location: [facility]OSCOPY;  Service: Cardiovascular;  Laterality: N/A;    Current Medications: No outpatient medications have been marked as taking for the 05/06/22 encounter (Appointment) with Richardo Priest, MD.     Allergies:   Patient has no known allergies.   Social History   Socioeconomic History   Marital status: Married    Spouse name: Not on file   Number of children: Not on file   Years of education: Not on file   Highest education level: Not on file  Occupational History   Not on file  Tobacco Use   Smoking status: Former    Types: Cigarettes    Quit date: 2007    Years since quitting: 16.8   Smokeless tobacco: Never  Vaping Use   Vaping Use: Never used  Substance and Sexual Activity   Alcohol use: No   Drug use: Yes    Frequency: 1.0 times per week    Types: Marijuana   Sexual activity: Not on file  Other Topics Concern   Not on file  Social History Narrative   Not on file   Social Determinants of Health   Financial Resource Strain: Not on file  Food Insecurity: Not on file  Transportation Needs: Not on file  Physical Activity: Not on file  Stress: Not on file  Social Connections: Not on file     Family History: The patient's ***family history includes CAD in his brother and mother; Drug abuse in his brother; Lung cancer in his father. ROS:   Please see the history of  present illness.    All other systems reviewed and are negative.  EKGs/Labs/Other Studies Reviewed:    The following studies were reviewed today:  EKG:  EKG ordered today and personally reviewed.  The ekg ordered today demonstrates ***  Recent Labs: 11/18/2021: ALT 9 12/04/2021: Magnesium 2.1 04/17/2022: BUN 42; Creatinine, Ser 3.66; Hemoglobin 7.6; Platelets 351; Potassium 4.3; Sodium 139  Recent Lipid Panel No results found for: "CHOL", "TRIG", "HDL", "CHOLHDL", "VLDL", "LDLCALC", "LDLDIRECT"  Physical Exam:    VS:  There were no vitals taken for this visit.    Wt Readings from Last 3 Encounters:  04/23/22 259 lb (117.5 kg)  04/17/22 263 lb (119.3 kg)  03/05/22 256 lb 3.2 oz (116.2 kg)     GEN: *** Well nourished, well developed in no acute distress HEENT: Normal NECK: No JVD; No carotid bruits LYMPHATICS: No lymphadenopathy CARDIAC: ***RRR, no murmurs, rubs, gallops RESPIRATORY:  Clear to auscultation without rales, wheezing or rhonchi  ABDOMEN: Soft, non-tender, non-distended MUSCULOSKELETAL:  No edema; No deformity  SKIN: Warm and dry NEUROLOGIC:  Alert and oriented x 3 PSYCHIATRIC:  Normal affect    Signed, Shirlee More, MD  05/05/2022 8:13 PM    [place]

## 2022-05-05 NOTE — Progress Notes (Signed)
Anesthesia Chart Review:  Case: [id number] Date/Time: 05/06/22 0932   Procedure: RIGHT ARM ARTERIOVENOUS (AV) FISTULA CREATION (Right)   Anesthesia type: Choice   Pre-op diagnosis: esrd   Location: [facility] OR ROOM 11 / [place] [facility]   Surgeons: Waynetta Sandy, MD       DISCUSSION: Patient is a 67 year old male scheduled for the above procedure.  History includes former smoker, CAD (s/p CABG: LIMA--LAD, SVG-OM1, SVG-PDA, LA MAZE Procedure/RF ablation 11/12/21; had post-operative left retroperitoneal hematoma, s/p aspiration/antibiotics, no growth on cultures; CKD->ESRD), chronic systolic CHF (in setting of afib with normalization of EF after 2016 DCCV; recurrent afib 05/09/21), atrial fibrillation/PAF (diagnosed ~ 07/2014; s/p DCCV 01/18/15, recurrent 05/09/21, s/p DCCV 11/21/21, 11/25/21, 11/27/21, 11/29/21, & 02/27/22, recurrent 04/17/22, planned DCCV 04/24/22 but EKG showed NSR), sinus node dysfunction/bradycardia, HTN, HLD, DM2 with neuropathy, obesity, OSA (uses CPAP), ESRD (CKD progressed to ESRD post CABG, started CRRT 11/15/21-11/20/21, HD started 11/23/21, right IJ [facility] 11/28/21).  Per Dr. Donzetta Matters, "He will need to be off Eliquis for 48 hours prior to our procedure..." He reported on 05/05/22, he was not given instructions to hold Eliquis. He was also not told to hold Trulicity, last dose 48/54/62.   He is a same day work-up. I reviewed with [facility] RN Herma Ard regarding Eliquis and Trulicitiy not being held for surgery. Would likely require GETA if GLP1 agonist not held for a full 7 days. She discussed with Dr. Donzetta Matters with plans to reschedule surgery.     VS:  BP Readings from Last 3 Encounters:  04/23/22 (!) 145/84  04/17/22 (!) 144/62  03/05/22 (!) 154/78   Pulse Readings from Last 3 Encounters:  04/23/22 71  04/17/22 81  03/05/22 83     PROVIDERS: Cyndi Bender, PA-C is PCP  Allegra Lai, MD is EP cardiologist Shirlee More, MD is cardiologist Loralie Champagne, MD is HF cardiologist (consulted post  CABG) Gardiner Rhyme, MD is pulmonologist Prescott Gum, Collier Salina, MD is CT surgeon He is also followed by [facility]. HD @ [facility]   LABS: For day of surgery. As of 04/30/22 [facility] CE), Cr 3.80, glucose 327, H/H 8.3/26.6, PLT 342. A1c on 11/08/21 was elevated at 9.3%.    PFTs 11/08/21: FVC 2.44 (53%), post 2.45 (54$). FEV1 1.88 (55%), post 1.98 (58%). DLCO unc 17.96 (67%), cor 18.63 (70%).     IMAGES: CXR 01/06/22: FINDINGS: Stable cardiomegaly. Sternotomy wires are noted. Right internal jugular dialysis catheter is unchanged in position. Mild bibasilar subsegmental atelectasis is noted with small pleural effusions. No pneumothorax is noted. Bony thorax is unremarkable. IMPRESSION: Mild bibasilar subsegmental atelectasis with small pleural effusions.    EKG: 04/24/22:  Normal sinus rhythm with 1st degree A-V block Right bundle branch block Abnormal ECG When compared with ECG of 27-Feb-2022 09:59, No significant change was found Confirmed by Buford Dresser [phone]) on 04/26/2022 11:12:44 PM    CV: Long term monitor 01/20/22-02/01/22: - Patch Wear Time:  12 days and 10 hours (2023-07-31T11:42:52-0400 to 2023-08-12T22:32:43-0400) - Atrial Flutter occurred continuously (100% burden), ranging from 62-138 bpm (avg of 96 bpm).  - There were 6 triggered events all atrial flutter with a rapid ventricular rate. - Isolated VEs were rare (<1.0%), VE Couplets were rare (<1.0%), and no VE Triplets were present.  -  Previously notified: MD notification criteria for First Documentation of Atrial Flutter met - notified Damaris Hippo RN on 20 Jan 2022 at 2:16 PM ET (RP).    TEE 11/21/21: IMPRESSIONS   1. Left ventricular ejection fraction,  by estimation, is 55 to 60%. The  left ventricle has normal function. The left ventricle has no regional  wall motion abnormalities but there is septal bounce suggestive of prior  cardiac surgery.   2. Right ventricular systolic function is mildly  reduced. The right  ventricular size is normal. Tricuspid regurgitation signal is inadequate  for assessing PA pressure.   3. Left atrial size was mildly dilated. No left atrial/left atrial  appendage thrombus was detected.   4. There is a PFO by color doppler and bubble study.   5. The mitral valve is normal in structure. Mild mitral valve  regurgitation. No evidence of mitral stenosis.   6. The aortic valve is tricuspid. There is mild calcification of the  aortic valve. Aortic valve regurgitation is not visualized. No aortic  stenosis is present.   7. A small pericardial effusion is present, primarily towards the apex.    TTE 11/13/21: IMPRESSIONS   1. Left ventricular ejection fraction, by estimation, is 55 to 60%. The  left ventricle has normal function. Left ventricular endocardial border  not optimally defined to evaluate regional wall motion. Left ventricular  diastolic parameters are consistent  with Grade II diastolic dysfunction (pseudonormalization).   2. Peak RV-RA gradient 28 mmHg. Right ventricular systolic function was  not well visualized. The right ventricular size is not well visualized.   3. The mitral valve was not well visualized. No evidence of mitral valve  regurgitation. No evidence of mitral stenosis.   4. The aortic valve is tricuspid. There is mild calcification of the  aortic valve. Aortic valve regurgitation is not visualized. Aortic valve  sclerosis is present, with no evidence of aortic valve stenosis.   5. No significant pericardial effusion.   6. The heart is poorly visualized. Grossly, LV systolic function appears  to be in the normal range.  - Comparison LVEF 50-55% 07/30/21; 60-65% 04/12/18; 30% 08/11/14.    US Carotid 11/08/21: Summary:  - Right Carotid: Velocities in the right ICA are consistent with a 1-39%  stenosis.  - Left Carotid: Velocities in the left ICA are consistent with a 1-39%  stenosis.  - Vertebrals:  Bilateral vertebral arteries  demonstrate antegrade flow.  - Subclavians: Normal flow hemodynamics were seen in bilateral subclavian               arteries.      LHC 10/24/21 (PRE-CABG):   Ost LM to Mid LM lesion is 50% stenosed.   Dist LM lesion is 75% stenosed.   Ost LAD to Prox LAD lesion is 50% stenosed.  Ectatic segment with eccentric calcium.   Ost Cx to Prox Cx lesion is 70% stenosed.   Mid LAD lesion is 50% stenosed.   Prox RCA to Mid RCA lesion is 70% stenosed.   LV end diastolic pressure is normal.   There is no aortic valve stenosis.   - Eccentric, severely calcified left main disease extending into the ostial LAD.  There is also ostial disease and a relatively small circumflex vessel.  Diffuse, calcific disease in the mid RCA today. - Patient was initially scheduled for A-fib ablation.  Plan now for cardiac surgery consult with consideration of CABG, maze and left atrial appendage clip.     CT Cardiac (ordered for anticipated ablation) 10/18/21: IMPRESSION: 1. There is normal pulmonary vein drainage into the left atrium. No pulmonary vein stenosis. 2. Normal left atrial appendage, no left atrial appendage thrombus. No intracardiac mass or thrombus. 3. The esophagus runs  in the left atrial midline and is not in the proximity to any of the pulmonary veins. 4. Coronary artery calcium score is 4439, which is 99th percentile for age and sex matched peers. Dense 3 vessel coronary calcifications involving the proximal vessels including left main coronary artery. 5. Mild dilation of main pulmonary artery, 30 mm, may indicate increased pulmonary pressure. - Referred for LHC by Dr. Curt Bears.      Past Medical History:  Diagnosis Date   Atherosclerotic heart disease    Benign essential HTN 07/30/2014   Bradycardia 01/25/2015   CHF (congestive heart failure) (HCC)    Chronic anticoagulation 56/25/6389   Chronic systolic CHF (congestive heart failure), NYHA class 3 ([place]) 08/30/2014   Overview:  Global ef  30%   CKD (chronic kidney disease)    Class 3 severe obesity in adult [facility]) 04/28/2017   Coronary artery disease    Diabetes mellitus without complication ([place])    Diabetic neuropathy ([place]) 07/28/2014   Dysrhythmia    Erectile dysfunction    High risk medication use 05/26/2018   Hypertensive heart disease with heart failure ([place]) 07/30/2014   Leukocytosis 11/26/2021   Lumbago    LV dysfunction 04/28/2017   Mixed hyperlipidemia 07/28/2014   OSA (obstructive sleep apnea)    CPAP   Paroxysmal atrial fibrillation ([place]) 07/28/2014   Postop check 01/06/2022   S/P CABG x 3 11/12/2021   Sinus node dysfunction ([place]) 04/21/2016   Testicular hypofunction    Uncontrolled type 2 diabetes mellitus with microalbuminuric diabetic nephropathy 07/28/2014    Past Surgical History:  Procedure Laterality Date   BUBBLE STUDY  11/21/2021   Procedure: BUBBLE STUDY;  Surgeon: Larey Dresser, MD;  Location: [place];  Service: Cardiovascular;;   CARDIOVERSION N/A 11/21/2021   Procedure: CARDIOVERSION;  Surgeon: Larey Dresser, MD;  Location[facility] ENDOSCOPY;  Service: Cardiovascular;  Laterality: N/A;   CARDIOVERSION N/A 11/25/2021   Procedure: CARDIOVERSION;  Surgeon: Larey Dresser, MD;  Location: [facility] ENDOSCOPY;  Service: Cardiovascular;  Laterality: N/A;   CARDIOVERSION N/A 11/29/2021   Procedure: CARDIOVERSION;  Surgeon: Larey Dresser, MD;  Location: [facility] END[facility]COPY;  Service: Cardiovascular;  Laterality: N/A;   CARDIOVERSION N/A 02/27/2022   Procedure: CARDIOVERSION;  Surgeon: Donato Heinz, MD;  Location: [facility] ENDOSCOPY;  Service: Cardiovascular;  Laterality: N/A;   CORONARY ARTERY BYPASS GRAFT N/A 11/12/2021   Procedure: CORONARY ARTERY BYPASS GRAFTING (CABG) x  3 USING LEFT INTERNAL MAMMARY ARTERY AND RIGHT GREATER SAPHENOUS VEIN;  Surgeon: Dahlia Byes, MD;  Location: [place];  Service: Open Heart Surgery;  Laterality: N/A;   ELECTROPHYSIOLOGIC STUDY N/A 01/18/2015   Procedure: CARDIOVERSION;   Surgeon: [name], MD;  Location: [facility] ORS;  Service: Cardiovascular;  Laterality: N/A;   ENDOVEIN HARVEST OF GREATER SAPHENOUS VEIN  11/12/2021   Procedure: ENDOVEIN HARVEST OF GREATER SAPHENOUS VEIN;  Surgeon: Dahlia Byes, MD;  Location: [place];  Service: Open Heart Surgery;;   EYE SURGERY Bilateral 2022   IR FLUORO GUIDE CV LINE RIGHT  11/28/2021   IR US GUIDE VASC ACCESS RIGHT  11/28/2021   LEFT HEART CATH AND CORONARY ANGIOGRAPHY N/A 10/24/2021   Procedure: LEFT HEART CATH AND CORONARY ANGIOGRAPHY;  Surgeon: Jettie Booze, MD;  Location: [place] CV LAB;  Service: Cardiovascular;  Laterality: N/A;   MAZE N/A 11/12/2021   Procedure: MAZE;  Surgeon: Dahlia Byes, MD;  Location: [place];  Service: Open Heart Surgery;  Laterality: N/A;   TEE WITHOUT CARDIOVERSION N/A 11/12/2021  Procedure: TRANSESOPHAGEAL ECHOCARDIOGRAM (TEE);  Surgeon: Dahlia Byes, MD;  Location: [place];  Service: Open Heart Surgery;  Laterality: N/A;   TEE WITHOUT CARDIOVERSION N/A 11/21/2021   Procedure: TRANSESOPHAGEAL ECHOCARDIOGRAM (TEE);  Surgeon: Larey Dresser, MD;  Location: [facility] ENDOSCOPY;  Service: Cardiovascular;  Laterality: N/A;    MEDICATIONS: No current facility-administered medications for this encounter.    albuterol (VENTOLIN HFA) 108 (90 Base) MCG/ACT inhaler   amiodarone (PACERONE) 200 MG tablet   aspirin EC 81 MG tablet   atorvastatin (LIPITOR) 80 MG tablet   B Complex-C-Folic Acid (DIALYVITE 014) 0.8 MG TABS   Dulaglutide (TRULICITY) 3 DC/3.0DT SOPN   ELIQUIS 5 MG TABS tablet   fluticasone furoate-vilanterol (BREO ELLIPTA) 200-25 MCG/ACT AEPB   insulin detemir (LEVEMIR) 100 UNIT/ML FlexPen   Insulin Pen Needle 32G X 4 MM MISC   metoprolol succinate (TOPROL-XL) 25 MG 24 hr tablet   midodrine (PROAMATINE) 5 MG tablet   torsemide (DEMADEX) 20 MG tablet   traMADol (ULTRAM) 50 MG tablet    Myra Gianotti, PA-C Surgical Short Stay/Anesthesiology [facility] Phone [phone] [facility]  Phone [phone] 05/05/2022 1:33 PM

## 2022-05-05 NOTE — Progress Notes (Signed)
Instructions only were given today.   Per the patient's request, I also sent text messages with the medication instructions and Dr. Claretha Cooper office number.   Blood Thinner Instructions: Was not told to stop Eliquis. Per note from Dr Donzetta Matters it said he was to stop 48 hrs prior his surgery. Pt to call Dr. Claretha Cooper office.  ASA: Continue  GLP-1 last dose: Trulicity 65/99/35  ERAS Protcol - NPO  Anesthesia review: Y  Patient verbally denies any shortness of breath, fever, cough and chest pain during phone call   -------------  SDW INSTRUCTIONS given:  Your procedure is scheduled on 05/06/22.  Report to [facility] Main Entrance "A" at [place].M., and check in at the Admitting office.  Call this number if you have problems the morning of surgery:  [phone]   Remember:  Do not eat or drink after midnight the night before your surgery    Take these medicines the morning of surgery with A SIP OF WATER  amiodarone (PACERONE)  atorvastatin (LIPITOR) fluticasone furoate-vilanterol (BREO ELLIPTA)  metoprolol succinate (TOPROL-XL)  midodrine (PROAMATINE) albuterol (VENTOLIN HFA)-if needed (Please bring on the day of surgery) traMADol (ULTRAM)-if needed  insulin detemir (LEVEMIR): Take 50% of dosage the night before and the day of surgery.   .** PLEASE check your blood sugar the morning of your surgery when you wake up and every 2 hours until you get to the Short Stay unit.  If your blood sugar is less than 70 mg/dL, you will need to treat for low blood sugar: Do not take insulin. Treat a low blood sugar (less than 70 mg/dL) with  cup of clear juice (cranberry or apple), 4 glucose tablets, OR glucose gel. Recheck blood sugar in 15 minutes after treatment (to make sure it is greater than 70 mg/dL). If your blood sugar is not greater than 70 mg/dL on recheck, call [phone] for further instructions.  As of today, STOP taking any Aspirin (unless otherwise instructed by your surgeon) Aleve,  Naproxen, Ibuprofen, Motrin, Advil, Goody's, BC's, all herbal medications, fish oil, and all vitamins.                      Do not wear jewelry, make up, or nail polish            Do not wear lotions, powders, perfumes/colognes, or deodorant.            Do not shave 48 hours prior to surgery.  Men may shave face and neck.            Do not bring valuables to the hospital.            [facility] is not responsible for any belongings or valuables.  Do NOT Smoke (Tobacco/Vaping) 24 hours prior to your procedure If you use a CPAP at night, you may bring all equipment for your overnight stay.   Contacts, glasses, dentures or bridgework may not be worn into surgery.      For patients admitted to the hospital, discharge time will be determined by your treatment team.   Patients discharged the day of surgery will not be allowed to drive home, and someone needs to stay with them for 24 hours.    Special instructions:   [place]- Preparing For Surgery  Before surgery, you can play an important role. Because skin is not sterile, your skin needs to be as free of germs as possible. You can reduce the number of germs on  your skin by washing with CHG (chlorahexidine gluconate) Soap before surgery.  CHG is an antiseptic cleaner which kills germs and bonds with the skin to continue killing germs even after washing.    Oral Hygiene is also important to reduce your risk of infection.  Remember - BRUSH YOUR TEETH THE MORNING OF SURGERY WITH YOUR REGULAR TOOTHPASTE  Please do not use if you have an allergy to CHG or antibacterial soaps. If your skin becomes reddened/irritated stop using the CHG.  Do not shave (including legs and underarms) for at least 48 hours prior to first CHG shower. It is OK to shave your face.  Please follow these instructions carefully.   Shower the NIGHT BEFORE SURGERY and the MORNING OF SURGERY with DIAL Soap.   Pat yourself dry with a CLEAN TOWEL.  Wear CLEAN PAJAMAS to  bed the night before surgery  Place CLEAN SHEETS on your bed the night of your first shower and DO NOT SLEEP WITH PETS.   Day of Surgery: Please shower morning of surgery  Wear Clean/Comfortable clothing the morning of surgery Do not apply any deodorants/lotions.   Remember to brush your teeth WITH YOUR REGULAR TOOTHPASTE.   Questions were answered. Patient verbalized understanding of instructions.

## 2022-05-05 NOTE — Telephone Encounter (Signed)
Spoke with patient regarding surgery for right arm AVF vs AVG scheduled for tomorrow. Received notification that patient did not hold Eliquis or Trulicity as required. Informed Dr. Donzetta Matters who recommended to reschedule patient for a later date. Patient agreed to be scheduled for 05/20/22. Patient reports he normally takes Trulicity on Friday. Instructed to hold Trulicity 7 days prior, last dose on 05/09/22 and hold Eliquis 2 days prior, last dose on 05/17/22. Patient verbalized understanding but stated he was not able to write anything down because he was currently at dialysis. Advised patient, I will fax a letter with instructions to dialysis center. Patient verbalized understanding.   Letter faxed to [facility].

## 2022-05-06 ENCOUNTER — Ambulatory Visit: Payer: Medicare HMO | Admitting: Cardiology

## 2022-05-06 ENCOUNTER — Encounter: Payer: Self-pay | Admitting: Nephrology

## 2022-05-09 DIAGNOSIS — N186 End stage renal disease: Secondary | ICD-10-CM | POA: Diagnosis not present

## 2022-05-09 DIAGNOSIS — N2581 Secondary hyperparathyroidism of renal origin: Secondary | ICD-10-CM | POA: Diagnosis not present

## 2022-05-09 DIAGNOSIS — Z992 Dependence on renal dialysis: Secondary | ICD-10-CM | POA: Diagnosis not present

## 2022-05-13 ENCOUNTER — Ambulatory Visit: Payer: Medicare HMO | Attending: Cardiology | Admitting: Cardiology

## 2022-05-13 ENCOUNTER — Encounter: Payer: Self-pay | Admitting: Cardiology

## 2022-05-13 VITALS — BP 136/60 | HR 66 | Ht 70.0 in | Wt 255.0 lb

## 2022-05-13 DIAGNOSIS — N189 Chronic kidney disease, unspecified: Secondary | ICD-10-CM | POA: Diagnosis not present

## 2022-05-13 DIAGNOSIS — Z7901 Long term (current) use of anticoagulants: Secondary | ICD-10-CM

## 2022-05-13 DIAGNOSIS — I484 Atypical atrial flutter: Secondary | ICD-10-CM | POA: Diagnosis not present

## 2022-05-13 DIAGNOSIS — Z79899 Other long term (current) drug therapy: Secondary | ICD-10-CM

## 2022-05-13 DIAGNOSIS — I110 Hypertensive heart disease with heart failure: Secondary | ICD-10-CM | POA: Diagnosis not present

## 2022-05-13 DIAGNOSIS — I25708 Atherosclerosis of coronary artery bypass graft(s), unspecified, with other forms of angina pectoris: Secondary | ICD-10-CM | POA: Diagnosis not present

## 2022-05-13 NOTE — Progress Notes (Signed)
Cardiology Office Note:    Date:  05/13/2022   ID:  [name] [name], DOB 10-13-1955, MRN [id number]  PCP:  Cyndi Bender, PA-C  Cardiologist:  Shirlee More, MD    Referring MD: Cyndi Bender, PA-C    ASSESSMENT:    1. Atypical atrial flutter ([place])   2. Chronic anticoagulation   3. On amiodarone therapy   4. Coronary artery disease of bypass graft of native heart with stable angina pectoris ([place])   5. Hypertensive heart disease with heart failure ([place])   6. Chronic kidney disease, unspecified CKD stage    PLAN:    In order of problems listed above:  He is maintaining sinus rhythm continue his amiodarone labs are drawn and asked the dialysis center to check thyroids and send a copy to me Continue anticoagulant stop aspirin he tells me at one time he had hematemesis positive stool and he has ongoing anemia with end-stage renal disease Stable CAD more than 6 months remote from CABG we can stop aspirin Stable heart failure is nicely compensated with ultrafiltration will continue his loop diuretic Stable kidney disease followed in dialysis center Next visit consider discontinuing midodrine   Next appointment: 3 months   Medication Adjustments/Labs and Tests Ordered: Current medicines are reviewed at length with the patient today.  Concerns regarding medicines are outlined above.  No orders of the defined types were placed in this encounter.  No orders of the defined types were placed in this encounter.   Chief complaint follow-up atrial flutter   History of Present Illness:    [name] is a 66 y.o. male with a hx of atrial fibrillation hypertensive heart disease with chronic diastolic heart failure chronic kidney disease and CABG 11/12/2021 with recurrent postoperative atrial fibrillation flutter requiring multiple cardioversions as inpatient. With acute on chronic kidney disease he required hemodialysis and his course was complicated by hypotension requiring  vasopressors IV and oral midodrine. He also had anemia and was transfused 1 unit of packed cells 01/17/2022 for hemoglobin of 6.4. With ongoing atrial fibrillation and flutter rapid rate he was again cardioverted sinus rhythm 02/27/2022 at [facility].  He was last seen 03/05/2022.  At that time he is maintaining sinus rhythm after cardioversion with surgical Maze procedure and his amiodarone dosage was decreased.  Compliance with diet, lifestyle and medications: Yes  Overall doing better still pending placement of fistula for dialysis access and will be done next week He was very anemic hemoglobin was under 7 he went for transfusion as above 7 now it is above 8 and it was not transfused Feels better but still fatigues easily Takes aspirin and Eliquis he is more than 6 months out from bypass MRI to go ahead and stop his antiplatelet therapy He has had no palpitations syncope edema orthopnea shortness of breath I encouraged him to purchase a mobile Kardia to screen heart rhythm at home Past Medical History:  Diagnosis Date   Atherosclerotic heart disease    Benign essential HTN 07/30/2014   Bradycardia 01/25/2015   CHF (congestive heart failure) (HCC)    Chronic anticoagulation 84/53/6468   Chronic systolic CHF (congestive heart failure), NYHA class 3 ([place]) 08/30/2014   Overview:  Global ef 30%   CKD (chronic kidney disease)    Class 3 severe obesity in adult [facility]) 04/28/2017   Coronary artery disease    Diabetes mellitus without complication ([place])    Diabetic neuropathy ([place]) 07/28/2014   Dysrhythmia    Erectile dysfunction  High risk medication use 05/26/2018   Hypertensive heart disease with heart failure ([place]) 07/30/2014   Leukocytosis 11/26/2021   Lumbago    LV dysfunction 04/28/2017   Mixed hyperlipidemia 07/28/2014   OSA (obstructive sleep apnea)    CPAP   Paroxysmal atrial fibrillation ([employer]) 07/28/2014   Postop check 01/06/2022   S/P CABG x 3 11/12/2021   Sinus node  dysfunction ([place]) 04/21/2016   Testicular hypofunction    Uncontrolled type 2 diabetes mellitus with microalbuminuric diabetic nephropathy 07/28/2014    Past Surgical History:  Procedure Laterality Date   BUBBLE STUDY  11/21/2021   Procedure: BUBBLE STUDY;  Surgeon: Larey Dresser, MD;  Location: [place];  Service: Cardiovascular;;   CARDIOVERSION N/A 11/21/2021   Procedure: CARDIOVERSION;  Surgeon: Larey Dresser, MD;  Location: [facility] ENDOSCOPY;  Service: Cardiovascular;  Laterality: N/A;   CARDIOVERSION N/A 11/25/2021   Procedure: CARDIOVERSION;  Surgeon: Larey Dresser, MD;  Location: [facility] ENDOSCOPY;  Service: Cardiovascular;  Laterality: N/A;   CARDIOVERSION N/A 11/29/2021   Procedure: CARDIOVERSION;  Surgeon: Larey Dresser, MD;  Location: [facility] ENDOSCOPY;  Service: Cardiovascular;  Laterality: N/A;   CARDIOVERSION N/A 02/27/2022   Procedure: CARDIOVERSION;  Surgeon: Donato Heinz, MD;  Location: [facility] ENDOSCOPY;  Service: Cardiovascular;  Laterality: N/A;   CORONARY ARTERY BYPASS GRAFT N/A 11/12/2021   Procedure: CORONARY ARTERY BYPASS GRAFTING (CABG) x  3 USING LEFT INTERNAL MAMMARY ARTERY AND RIGHT GREATER SAPHENOUS VEIN;  Surgeon: Dahlia Byes, MD;  Location: [place];  Service: Open Heart Surgery;  Laterality: N/A;   ELECTROPHYSIOLOGIC STUDY N/A 01/18/2015   Procedure: CARDIOVERSION;  Surgeon: [name], MD;  Location: [facility] ORS;  Service: Cardiovascular;  Laterality: N/A;   ENDOVEIN HARVEST OF GREATER SAPHENOUS VEIN  11/12/2021   Procedure: ENDOVEIN HARVEST OF GREATER SAPHENOUS VEIN;  Surgeon: Dahlia Byes, MD;  Location: [place];  Service: Open Heart Surgery;;   EYE SURGERY Bilateral 2022   IR FLUORO GUIDE CV LINE RIGHT  11/28/2021   IR US GUIDE VASC ACCESS RIGHT  11/28/2021   LEFT HEART CATH AND CORONARY ANGIOGRAPHY N/A 10/24/2021   Procedure: LEFT HEART CATH AND CORONARY ANGIOGRAPHY;  Surgeon: Jettie Booze, MD;  Location: [place] CV LAB;  Service:  Cardiovascular;  Laterality: N/A;   MAZE N/A 11/12/2021   Procedure: MAZE;  Surgeon: Dahlia Byes, MD;  Location: [place];  Service: Open Heart Surgery;  Laterality: N/A;   TEE WITHOUT CARDIOVERSION N/A 11/12/2021   Procedure: TRANSESOPHAGEAL ECHOCARDIOGRAM (TEE);  Surgeon: Dahlia Byes, MD;  Location: [place];  Service: Open Heart Surgery;  Laterality: N/A;   TEE WITHOUT CARDIOVERSION N/A 11/21/2021   Procedure: TRANSESOPHAGEAL ECHOCARDIOGRAM (TEE);  Surgeon: Larey Dresser, MD;  Location: [facility] ENDOSCOPY;  Service: Cardiovascular;  Laterality: N/A;    Current Medications: No outpatient medications have been marked as taking for the 05/13/22 encounter (Office Visit) with Richardo Priest, MD.     Allergies:   Patient has no known allergies.   Social History   Socioeconomic History   Marital status: Married    Spouse name: Not on file   Number of children: Not on file   Years of education: Not on file   Highest education level: Not on file  Occupational History   Not on file  Tobacco Use   Smoking status: Former    Types: Cigarettes    Quit date: 2007    Years since quitting: 16.8   Smokeless tobacco: Never  Vaping Use  Vaping Use: Never used  Substance and Sexual Activity   Alcohol use: No   Drug use: Yes    Frequency: 1.0 times per week    Types: Marijuana   Sexual activity: Not on file  Other Topics Concern   Not on file  Social History Narrative   Not on file   Social Determinants of Health   Financial Resource Strain: Not on file  Food Insecurity: Not on file  Transportation Needs: Not on file  Physical Activity: Not on file  Stress: Not on file  Social Connections: Not on file     Family History: The patient's family history includes CAD in his brother and mother; Drug abuse in his brother; Lung cancer in his father. ROS:   Please see the history of present illness.    All other systems reviewed and are negative.  EKGs/Labs/Other Studies Reviewed:     The following studies were reviewed today:  EKG:  EKG ordered today and personally reviewed.  The ekg ordered today demonstrates sinus rhythm first-degree AV block right bundle branch block  Recent Labs: 11/18/2021: ALT 9 12/04/2021: Magnesium 2.1 04/17/2022: BUN 42; Creatinine, Ser 3.66; Hemoglobin 7.6; Platelets 351; Potassium 4.3; Sodium 139  Recent Lipid Panel No results found for: "CHOL", "TRIG", "HDL", "CHOLHDL", "VLDL", "LDLCALC", "LDLDIRECT"  Physical Exam:    VS:  BP 136/60 (BP Location: Right Arm, Patient Position: Sitting)   Pulse 66   Ht 5\' 10"  (1.778 m)   Wt 255 lb (115.7 kg)   SpO2 98%   BMI 36.59 kg/m     Wt Readings from Last 3 Encounters:  05/13/22 255 lb (115.7 kg)  04/23/22 259 lb (117.5 kg)  04/17/22 263 lb (119.3 kg)     GEN:  Well nourished, well developed in no acute distress HEENT: Normal NECK: No JVD; No carotid bruits LYMPHATICS: No lymphadenopathy CARDIAC: RRR, no murmurs, rubs, gallops RESPIRATORY:  Clear to auscultation without rales, wheezing or rhonchi  ABDOMEN: Soft, non-tender, non-distended MUSCULOSKELETAL:  No edema; No deformity  SKIN: Warm and dry NEUROLOGIC:  Alert and oriented x 3 PSYCHIATRIC:  Normal affect    Signed, Shirlee More, MD  05/13/2022 12:47 PM    [place]

## 2022-05-13 NOTE — Patient Instructions (Addendum)
Medication Instructions:  Your physician has recommended you make the following change in your medication:   STOP: Aspirin  *If you need a refill on your cardiac medications before your next appointment, please call your pharmacy*   Lab Work: None If you have labs (blood work) drawn today and your tests are completely normal, you will receive your results only by: [place] (if you have MyChart) OR A paper copy in the mail If you have any lab test that is abnormal or we need to change your treatment, we will call you to review the results.   Testing/Procedures: None   Follow-Up: At [facility], you and your health needs are our priority.  As part of our continuing mission to provide you with exceptional heart care, we have created designated Provider Care Teams.  These Care Teams include your primary Cardiologist (physician) and Advanced Practice Providers (APPs -  Physician Assistants and Nurse Practitioners) who all work together to provide you with the care you need, when you need it.  We recommend signing up for the patient portal called "MyChart".  Sign up information is provided on this After Visit Summary.  MyChart is used to connect with patients for Virtual Visits (Telemedicine).  Patients are able to view lab/test results, encounter notes, upcoming appointments, etc.  Non-urgent messages can be sent to your provider as well.   To learn more about what you can do with MyChart, go to [email or web address].    Your next appointment:   3 month(s)  The format for your next appointment:   In Person  Provider:   Shirlee More, MD    Other Instructions Purchase a mobile kardia  Note to do TSH, T3, T4 at hemodialysis and fax to Dr. Bettina Gavia [phone])  Important Information About Sugar          KardiaMobile [email or web address]        FDA-cleared, clinical grade mobile EKG monitor: Jodelle Red is the most  clinically-validated mobile EKG used by the world's leading cardiac care medical professionals With Basic service, know instantly if your heart rhythm is normal or if atrial fibrillation is detected, and email the last single EKG recording to yourself or your doctor Premium service, available for purchase through the Kardia app for $9.99 per month or $99 per year, includes unlimited history and storage of your EKG recordings, a monthly EKG summary report to share with your doctor, along with the ability to track your blood pressure, activity and weight Includes one KardiaMobile phone clip FREE SHIPPING: Standard delivery 1-3 business days. Orders placed by 11:00am PST will ship that afternoon. Otherwise, will ship next business day. All orders ship via [employer] from [place], [place]

## 2022-05-13 NOTE — Addendum Note (Signed)
Addended by: Edwyna Shell I on: 05/13/2022 01:07 PM   Modules accepted: Orders

## 2022-05-16 ENCOUNTER — Encounter (HOSPITAL_COMMUNITY): Payer: Self-pay | Admitting: Vascular Surgery

## 2022-05-16 ENCOUNTER — Other Ambulatory Visit: Payer: Self-pay

## 2022-05-16 DIAGNOSIS — N186 End stage renal disease: Secondary | ICD-10-CM | POA: Diagnosis not present

## 2022-05-16 DIAGNOSIS — Z992 Dependence on renal dialysis: Secondary | ICD-10-CM | POA: Diagnosis not present

## 2022-05-16 DIAGNOSIS — N2581 Secondary hyperparathyroidism of renal origin: Secondary | ICD-10-CM | POA: Diagnosis not present

## 2022-05-16 NOTE — Progress Notes (Signed)
PCP - Cyndi Bender, PA-C Cardiologist - Shirlee More, MD  PPM/ICD - denies  Chest x-ray - 01/06/22 EKG - 04/24/22 ECHO - 11/21/21 Cardiac Cath - 10/24/21  CPAP - n/a  Fasting Blood Sugar - patient is not checking CBG at home Patient on GLP-1 Agonist - hold Trulicity 7 days prior to surgery  Blood Thinner Instructions: Eliquis - hold 2 days prior to surgery per MD - last dose 05/17/22 Aspirin Instructions: Patient was instructed: As of today, STOP taking any Aspirin (unless otherwise instructed by your surgeon) Aleve, Naproxen, Ibuprofen, Motrin, Advil, Goody's, BC's, all herbal medications, fish oil, and all vitamins.  ERAS Protcol - n/a  COVID TEST- n/a  Anesthesia review: yes - cardiac history  Patient verbally denies any shortness of breath, fever, cough and chest pain during phone call   -------------  SDW INSTRUCTIONS given:  Your procedure is scheduled on Tuesday, November 28th, 2023.  Report to [facility] Main Entrance "A" at 10:20 A.M., and check in at the Admitting office.  Call this number if you have problems the morning of surgery:  [phone]   Remember:  Do not eat or drink after midnight the night before your surgery    Take these medicines the morning of surgery with A SIP OF WATER: Amiodarone, Lipitor, Metoprolol, PRN: Tramadol, inhalers  Please bring all inhalers with you the day of surgery.    Hold Trulicity 7 days prior to surgery  THE NIGHT BEFORE SURGERY, take 20 units of Levemir insulin - 50% of your regular dose    THE MORNING OF SURGERY, take 20 units of Levemir insulin - 50% of your regular dose   How do I manage my blood sugar before surgery? Check your blood sugar at least 4 times a day, starting 2 days before surgery, to make sure that the level is not too high or low.  Check your blood sugar the morning of your surgery when you wake up and every 2 hours until you get to the Short Stay unit.  If your blood sugar is less than 70  mg/dL, you will need to treat for low blood sugar: Do not take insulin. Treat a low blood sugar (less than 70 mg/dL) with  cup of clear juice (cranberry or apple), 4 glucose tablets, OR glucose gel. Recheck blood sugar in 15 minutes after treatment (to make sure it is greater than 70 mg/dL). If your blood sugar is not greater than 70 mg/dL on recheck, call [phone] for further instructions. Report your blood sugar to the short stay nurse when you get to Short Stay.    The day pf surgery:                     Do not wear jewelry, make up, or nail polish            Do not wear lotions, powders, perfumes/colognes, or deodorant.            Do not shave 48 hours prior to surgery.  Men may shave face and neck.            Do not bring valuables to the hospital.            [facility] is not responsible for any belongings or valuables.  Do NOT Smoke (Tobacco/Vaping) 24 hours prior to your procedure If you use a CPAP at night, you may bring all equipment for your overnight stay.   Contacts, glasses, dentures or bridgework may not  be worn into surgery.      For patients admitted to the hospital, discharge time will be determined by your treatment team.   Patients discharged the day of surgery will not be allowed to drive home, and someone needs to stay with them for 24 hours.    Special instructions:   [place]- Preparing For Surgery  Before surgery, you can play an important role. Because skin is not sterile, your skin needs to be as free of germs as possible. You can reduce the number of germs on your skin by washing with CHG (chlorahexidine gluconate) Soap before surgery.  CHG is an antiseptic cleaner which kills germs and bonds with the skin to continue killing germs even after washing.    Oral Hygiene is also important to reduce your risk of infection.  Remember - BRUSH YOUR TEETH THE MORNING OF SURGERY WITH YOUR REGULAR TOOTHPASTE  Please do not use if you have an allergy to CHG  or antibacterial soaps. If your skin becomes reddened/irritated stop using the CHG.  Do not shave (including legs and underarms) for at least 48 hours prior to first CHG shower. It is OK to shave your face.  Please follow these instructions carefully.   Shower the NIGHT BEFORE SURGERY and the MORNING OF SURGERY with DIAL Soap.   Pat yourself dry with a CLEAN TOWEL.  Wear CLEAN PAJAMAS to bed the night before surgery  Place CLEAN SHEETS on your bed the night of your first shower and DO NOT SLEEP WITH PETS.   Day of Surgery: Please shower morning of surgery  Wear Clean/Comfortable clothing the morning of surgery Do not apply any deodorants/lotions.   Remember to brush your teeth WITH YOUR REGULAR TOOTHPASTE.   Questions were answered. Patient verbalized understanding of instructions.

## 2022-05-19 DIAGNOSIS — N186 End stage renal disease: Secondary | ICD-10-CM | POA: Diagnosis not present

## 2022-05-19 DIAGNOSIS — Z992 Dependence on renal dialysis: Secondary | ICD-10-CM | POA: Diagnosis not present

## 2022-05-19 DIAGNOSIS — N2581 Secondary hyperparathyroidism of renal origin: Secondary | ICD-10-CM | POA: Diagnosis not present

## 2022-05-19 NOTE — Anesthesia Preprocedure Evaluation (Signed)
Anesthesia Evaluation  Patient identified by MRN, date of birth, ID band Patient awake    Reviewed: Allergy & Precautions, H&P , NPO status , Patient's Chart, lab work & pertinent test results  Airway Mallampati: III  TM Distance: <3 FB Neck ROM: Full    Dental no notable dental hx.    Pulmonary sleep apnea and Continuous Positive Airway Pressure Ventilation , former smoker   Pulmonary exam normal breath sounds clear to auscultation       Cardiovascular hypertension, Pt. on medications and Pt. on home beta blockers + CAD, + CABG and +CHF  Normal cardiovascular exam Rhythm:Regular Rate:Normal     Neuro/Psych negative neurological ROS  negative psych ROS   GI/Hepatic negative GI ROS, Neg liver ROS,,,  Endo/Other  diabetes, Poorly Controlled, Type 2, Insulin Dependent    Renal/GU negative Renal ROS  negative genitourinary   Musculoskeletal negative musculoskeletal ROS (+)    Abdominal   Peds negative pediatric ROS (+)  Hematology negative hematology ROS (+)   Anesthesia Other Findings   Reproductive/Obstetrics negative OB ROS                             Anesthesia Physical Anesthesia Plan  ASA: 3  Anesthesia Plan: MAC and Regional   Post-op Pain Management: Regional block*   Induction: Intravenous  PONV Risk Score and Plan: 1 and Propofol infusion, Treatment may vary due to age or medical condition and Ondansetron  Airway Management Planned: Nasal Cannula, Natural Airway and Simple Face Mask  Additional Equipment:   Intra-op Plan:   Post-operative Plan:   Informed Consent: I have reviewed the patients History and Physical, chart, labs and discussed the procedure including the risks, benefits and alternatives for the proposed anesthesia with the patient or authorized representative who has indicated his/her understanding and acceptance.     Dental advisory given  Plan  Discussed with: CRNA and Surgeon  Anesthesia Plan Comments: (PAT note written 05/19/2022 by Myra Gianotti, PA-C.  )       Anesthesia Quick Evaluation

## 2022-05-19 NOTE — Progress Notes (Signed)
Spoke with pt, he will arrive tom at 0730.

## 2022-05-19 NOTE — Progress Notes (Signed)
Anesthesia Chart Review: [name] [name]  Case: [id number] Date/Time: 05/20/22 1236   Procedure: RIGHT ARM ARTERIOVENOUS (AV) FISTULA CREATION (Right)   Anesthesia type: Choice   Pre-op diagnosis: esrd   Location: [facility] OR ROOM 11 / [place] OR   Surgeons: Waynetta Sandy, MD       DISCUSSION: See my previous note signed on 05/05/22. Surgery was initially scheduled for 05/06/22, but rescheduled for 05/20/22 since he had not held Eliquis for 48 hours or Trulicity for 7 days prior to surgery. VVS and Short Stay RN staff provided these instructions for this procedure (see Letters tab).    Myra Gianotti, PA-C Surgical Short Stay/Anesthesiology [facility] Phone [phone] [facility] Phone [phone] 05/19/2022 10:30 AM

## 2022-05-20 ENCOUNTER — Ambulatory Visit (HOSPITAL_BASED_OUTPATIENT_CLINIC_OR_DEPARTMENT_OTHER): Payer: Medicare HMO | Admitting: Vascular Surgery

## 2022-05-20 ENCOUNTER — Encounter (HOSPITAL_COMMUNITY): Payer: Self-pay | Admitting: Vascular Surgery

## 2022-05-20 ENCOUNTER — Other Ambulatory Visit: Payer: Self-pay

## 2022-05-20 ENCOUNTER — Other Ambulatory Visit (HOSPITAL_COMMUNITY): Payer: Self-pay

## 2022-05-20 ENCOUNTER — Encounter (HOSPITAL_COMMUNITY)
Admission: RE | Disposition: A | Discharge status: Home / Self Care | Payer: Self-pay | Source: Home / Self Care | Attending: Vascular Surgery

## 2022-05-20 ENCOUNTER — Ambulatory Visit (HOSPITAL_COMMUNITY)
Admission: RE | Admit: 2022-05-20 | Discharge: 2022-05-20 | Disposition: A | Discharge status: Home / Self Care | Payer: Medicare HMO | Attending: Vascular Surgery | Admitting: Vascular Surgery

## 2022-05-20 ENCOUNTER — Ambulatory Visit (HOSPITAL_COMMUNITY): Payer: Medicare HMO | Admitting: Vascular Surgery

## 2022-05-20 DIAGNOSIS — N186 End stage renal disease: Secondary | ICD-10-CM

## 2022-05-20 DIAGNOSIS — I509 Heart failure, unspecified: Secondary | ICD-10-CM | POA: Diagnosis not present

## 2022-05-20 DIAGNOSIS — I2510 Atherosclerotic heart disease of native coronary artery without angina pectoris: Secondary | ICD-10-CM | POA: Diagnosis not present

## 2022-05-20 DIAGNOSIS — E1165 Type 2 diabetes mellitus with hyperglycemia: Secondary | ICD-10-CM | POA: Insufficient documentation

## 2022-05-20 DIAGNOSIS — E1122 Type 2 diabetes mellitus with diabetic chronic kidney disease: Secondary | ICD-10-CM | POA: Insufficient documentation

## 2022-05-20 DIAGNOSIS — I5022 Chronic systolic (congestive) heart failure: Secondary | ICD-10-CM | POA: Diagnosis not present

## 2022-05-20 DIAGNOSIS — Z87891 Personal history of nicotine dependence: Secondary | ICD-10-CM

## 2022-05-20 DIAGNOSIS — Z951 Presence of aortocoronary bypass graft: Secondary | ICD-10-CM | POA: Diagnosis not present

## 2022-05-20 DIAGNOSIS — I132 Hypertensive heart and chronic kidney disease with heart failure and with stage 5 chronic kidney disease, or end stage renal disease: Secondary | ICD-10-CM | POA: Insufficient documentation

## 2022-05-20 DIAGNOSIS — Z794 Long term (current) use of insulin: Secondary | ICD-10-CM | POA: Insufficient documentation

## 2022-05-20 DIAGNOSIS — G4730 Sleep apnea, unspecified: Secondary | ICD-10-CM | POA: Diagnosis not present

## 2022-05-20 DIAGNOSIS — N185 Chronic kidney disease, stage 5: Secondary | ICD-10-CM | POA: Diagnosis not present

## 2022-05-20 HISTORY — PX: AV FISTULA PLACEMENT: SHX1204

## 2022-05-20 LAB — POCT I-STAT, CHEM 8
BUN: 30 mg/dL — ABNORMAL HIGH (ref 8–23)
Calcium, Ion: 1.12 mmol/L — ABNORMAL LOW (ref 1.15–1.40)
Chloride: 103 mmol/L (ref 98–111)
Creatinine, Ser: 3.6 mg/dL — ABNORMAL HIGH (ref 0.61–1.24)
Glucose, Bld: 192 mg/dL — ABNORMAL HIGH (ref 70–99)
HCT: 31 % — ABNORMAL LOW (ref 39.0–52.0)
Hemoglobin: 10.5 g/dL — ABNORMAL LOW (ref 13.0–17.0)
Potassium: 4.2 mmol/L (ref 3.5–5.1)
Sodium: 140 mmol/L (ref 135–145)
TCO2: 26 mmol/L (ref 22–32)

## 2022-05-20 LAB — GLUCOSE, CAPILLARY
Glucose-Capillary: 183 mg/dL — ABNORMAL HIGH (ref 70–99)
Glucose-Capillary: 206 mg/dL — ABNORMAL HIGH (ref 70–99)
Glucose-Capillary: 177 mg/dL — ABNORMAL HIGH (ref 70–99)

## 2022-05-20 SURGERY — ARTERIOVENOUS (AV) FISTULA CREATION
Anesthesia: Monitor Anesthesia Care | Laterality: Right

## 2022-05-20 MED ORDER — HEPARIN 6000 UNIT IRRIGATION SOLUTION
Status: DC | PRN
  Administered 2022-05-20: 1

## 2022-05-20 MED ORDER — PROPOFOL 10 MG/ML IV BOLUS
INTRAVENOUS | Status: DC | PRN
  Administered 2022-05-20 (×2): 20 mg via INTRAVENOUS

## 2022-05-20 MED ORDER — INSULIN ASPART 100 UNIT/ML IJ SOLN
0.0000 [IU] | INTRAMUSCULAR | Status: AC | PRN
  Administered 2022-05-20 (×2): 2 [IU] via SUBCUTANEOUS
  Filled 2022-05-20: qty 1

## 2022-05-20 MED ORDER — HEPARIN 6000 UNIT IRRIGATION SOLUTION
Status: AC
  Filled 2022-05-20: qty 500

## 2022-05-20 MED ORDER — ALBUMIN HUMAN 5 % IV SOLN: INTRAVENOUS | Status: DC | PRN

## 2022-05-20 MED ORDER — ONDANSETRON HCL 4 MG/2ML IJ SOLN
INTRAMUSCULAR | Status: AC
  Filled 2022-05-20: qty 2

## 2022-05-20 MED ORDER — SODIUM CHLORIDE 0.9 % IV SOLN: INTRAVENOUS | Status: DC

## 2022-05-20 MED ORDER — METOPROLOL SUCCINATE ER 25 MG PO TB24
25.0000 mg | ORAL_TABLET | Freq: Once | ORAL | Status: AC
  Administered 2022-05-20: 25 mg via ORAL
  Filled 2022-05-20: qty 1

## 2022-05-20 MED ORDER — FENTANYL CITRATE PF 50 MCG/ML IJ SOSY
50.0000 ug | PREFILLED_SYRINGE | Freq: Once | INTRAMUSCULAR | Status: AC
  Administered 2022-05-20: 50 ug via INTRAVENOUS

## 2022-05-20 MED ORDER — OXYCODONE HCL 5 MG/5ML PO SOLN: 5.0000 mg | Freq: Once | ORAL | Status: DC | PRN

## 2022-05-20 MED ORDER — CEFAZOLIN SODIUM-DEXTROSE 2-4 GM/100ML-% IV SOLN
2.0000 g | INTRAVENOUS | Status: AC
  Administered 2022-05-20: 2 g via INTRAVENOUS
  Filled 2022-05-20: qty 100

## 2022-05-20 MED ORDER — KETAMINE HCL 50 MG/5ML IJ SOSY
PREFILLED_SYRINGE | INTRAMUSCULAR | Status: AC
  Filled 2022-05-20: qty 5

## 2022-05-20 MED ORDER — GLYCOPYRROLATE PF 0.2 MG/ML IJ SOSY
PREFILLED_SYRINGE | INTRAMUSCULAR | Status: DC | PRN
  Administered 2022-05-20: .1 mg via INTRAVENOUS

## 2022-05-20 MED ORDER — EPHEDRINE SULFATE-NACL 50-0.9 MG/10ML-% IV SOSY
PREFILLED_SYRINGE | INTRAVENOUS | Status: DC | PRN
  Administered 2022-05-20: 10 mg via INTRAVENOUS

## 2022-05-20 MED ORDER — DEXMEDETOMIDINE HCL IN NACL 80 MCG/20ML IV SOLN
INTRAVENOUS | Status: DC | PRN
  Administered 2022-05-20 (×2): 8 ug via BUCCAL

## 2022-05-20 MED ORDER — OXYCODONE HCL 5 MG PO TABS: 5.0000 mg | ORAL_TABLET | Freq: Once | ORAL | Status: DC | PRN

## 2022-05-20 MED ORDER — ONDANSETRON HCL 4 MG/2ML IJ SOLN
4.0000 mg | Freq: Once | INTRAMUSCULAR | Status: AC | PRN
  Administered 2022-05-20: 4 mg via INTRAVENOUS

## 2022-05-20 MED ORDER — CHLORHEXIDINE GLUCONATE 0.12 % MT SOLN
OROMUCOSAL | Status: AC
  Administered 2022-05-20: 15 mL
  Filled 2022-05-20: qty 15

## 2022-05-20 MED ORDER — LIDOCAINE HCL (PF) 1 % IJ SOLN
INTRAMUSCULAR | Status: AC
  Filled 2022-05-20: qty 30

## 2022-05-20 MED ORDER — CHLORHEXIDINE GLUCONATE 4 % EX LIQD: 60.0000 mL | Freq: Once | CUTANEOUS | Status: DC

## 2022-05-20 MED ORDER — GLYCOPYRROLATE PF 0.2 MG/ML IJ SOSY
PREFILLED_SYRINGE | INTRAMUSCULAR | Status: AC
  Filled 2022-05-20: qty 1

## 2022-05-20 MED ORDER — PROPOFOL 500 MG/50ML IV EMUL
INTRAVENOUS | Status: DC | PRN
  Administered 2022-05-20: 75 ug/kg/min via INTRAVENOUS

## 2022-05-20 MED ORDER — FENTANYL CITRATE (PF) 100 MCG/2ML IJ SOLN: 25.0000 ug | INTRAMUSCULAR | Status: DC | PRN

## 2022-05-20 MED ORDER — LIDOCAINE-EPINEPHRINE (PF) 1 %-1:200000 IJ SOLN
INTRAMUSCULAR | Status: DC | PRN
  Administered 2022-05-20: 5 mL

## 2022-05-20 MED ORDER — LIDOCAINE-EPINEPHRINE (PF) 1.5 %-1:200000 IJ SOLN
INTRAMUSCULAR | Status: DC | PRN
  Administered 2022-05-20: 30 mL via PERINEURAL

## 2022-05-20 MED ORDER — FENTANYL CITRATE (PF) 100 MCG/2ML IJ SOLN
INTRAMUSCULAR | Status: AC
  Filled 2022-05-20: qty 2

## 2022-05-20 MED ORDER — KETAMINE HCL 10 MG/ML IJ SOLN
INTRAMUSCULAR | Status: DC | PRN
  Administered 2022-05-20: 10 mg via INTRAVENOUS
  Administered 2022-05-20: 30 mg via INTRAVENOUS

## 2022-05-20 MED ORDER — EPHEDRINE 5 MG/ML INJ
INTRAVENOUS | Status: AC
  Filled 2022-05-20: qty 5

## 2022-05-20 MED ORDER — PHENYLEPHRINE 80 MCG/ML (10ML) SYRINGE FOR IV PUSH (FOR BLOOD PRESSURE SUPPORT)
PREFILLED_SYRINGE | INTRAVENOUS | Status: DC | PRN
  Administered 2022-05-20: 160 ug via INTRAVENOUS

## 2022-05-20 MED ORDER — TRAMADOL HCL 50 MG PO TABS
50.0000 mg | ORAL_TABLET | Freq: Three times a day (TID) | ORAL | 0 refills | Status: DC | PRN
  Filled 2022-05-20: qty 12, 4d supply, fill #0

## 2022-05-20 MED ORDER — PHENYLEPHRINE HCL-NACL 20-0.9 MG/250ML-% IV SOLN
INTRAVENOUS | Status: DC | PRN
  Administered 2022-05-20: 50 ug/min via INTRAVENOUS

## 2022-05-20 MED ORDER — MIDAZOLAM HCL 2 MG/2ML IJ SOLN
INTRAMUSCULAR | Status: AC
  Filled 2022-05-20: qty 2

## 2022-05-20 MED ORDER — LIDOCAINE-EPINEPHRINE (PF) 1 %-1:200000 IJ SOLN
INTRAMUSCULAR | Status: AC
  Filled 2022-05-20: qty 30

## 2022-05-20 SURGICAL SUPPLY — 30 items
ARMBAND PINK RESTRICT EXTREMIT (MISCELLANEOUS) ×1 IMPLANT
BAG COUNTER SPONGE SURGICOUNT (BAG) ×1 IMPLANT
CANISTER SUCT 3000ML PPV (MISCELLANEOUS) ×1 IMPLANT
CLIP LIGATING EXTRA MED SLVR (CLIP) ×1 IMPLANT
CLIP LIGATING EXTRA SM BLUE (MISCELLANEOUS) ×1 IMPLANT
COVER PROBE W GEL 5X96 (DRAPES) IMPLANT
DERMABOND ADVANCED .7 DNX12 (GAUZE/BANDAGES/DRESSINGS) ×1 IMPLANT
ELECT REM PT RETURN 9FT ADLT (ELECTROSURGICAL) ×1
ELECTRODE REM PT RTRN 9FT ADLT (ELECTROSURGICAL) ×1 IMPLANT
GLOVE BIO SURGEON STRL SZ7.5 (GLOVE) ×1 IMPLANT
GOWN STRL REUS W/ TWL LRG LVL3 (GOWN DISPOSABLE) ×2 IMPLANT
GOWN STRL REUS W/ TWL XL LVL3 (GOWN DISPOSABLE) ×1 IMPLANT
GOWN STRL REUS W/TWL LRG LVL3 (GOWN DISPOSABLE) ×2
GOWN STRL REUS W/TWL XL LVL3 (GOWN DISPOSABLE) ×1
INSERT FOGARTY SM (MISCELLANEOUS) IMPLANT
KIT BASIN OR (CUSTOM PROCEDURE TRAY) ×1 IMPLANT
KIT TURNOVER KIT B (KITS) ×1 IMPLANT
NS IRRIG 1000ML POUR BTL (IV SOLUTION) ×1 IMPLANT
PACK CV ACCESS (CUSTOM PROCEDURE TRAY) ×1 IMPLANT
PAD ARMBOARD 7.5X6 YLW CONV (MISCELLANEOUS) ×2 IMPLANT
POWDER SURGICEL 3.0 GRAM (HEMOSTASIS) IMPLANT
SLING ARM FOAM STRAP LRG (SOFTGOODS) IMPLANT
SLING ARM FOAM STRAP MED (SOFTGOODS) IMPLANT
SUT MNCRL AB 4-0 PS2 18 (SUTURE) ×1 IMPLANT
SUT PROLENE 6 0 BV (SUTURE) ×1 IMPLANT
SUT VIC AB 3-0 SH 27 (SUTURE) ×1
SUT VIC AB 3-0 SH 27X BRD (SUTURE) ×1 IMPLANT
TOWEL GREEN STERILE (TOWEL DISPOSABLE) ×1 IMPLANT
UNDERPAD 30X36 HEAVY ABSORB (UNDERPADS AND DIAPERS) ×1 IMPLANT
WATER STERILE IRR 1000ML POUR (IV SOLUTION) ×1 IMPLANT

## 2022-05-20 NOTE — Anesthesia Procedure Notes (Signed)
Anesthesia Regional Block: Supraclavicular block   Pre-Anesthetic Checklist: , timeout performed,  Correct Patient, Correct Site, Correct Laterality,  Correct Procedure, Correct Position, site marked,  Risks and benefits discussed,  Surgical consent,  Pre-op evaluation,  At surgeon's request and post-op pain management  Laterality: Right  Prep: Dura Prep       Needles:  Injection technique: Single-shot  Needle Type: Echogenic Stimulator Needle     Needle Length: 5cm  Needle Gauge: 20     Additional Needles:   Procedures:,,,, ultrasound used (permanent image in chart),,    Narrative:  Start time: 05/20/2022 11:05 AM End time: 05/20/2022 11:09 AM Injection made incrementally with aspirations every 5 mL.  Performed by: Personally  Anesthesiologist: Darral Dash, DO  Additional Notes: Patient identified. Risks/Benefits/Options discussed with patient including but not limited to bleeding, infection, nerve damage, failed block, incomplete pain control. Patient expressed understanding and wished to proceed. All questions were answered. Sterile technique was used throughout the entire procedure. Please see nursing notes for vital signs. Aspirated in 5cc intervals with injection for negative confirmation. Patient was given instructions on fall risk and not to get out of bed. All questions and concerns addressed with instructions to call with any issues or inadequate analgesia.

## 2022-05-20 NOTE — Discharge Instructions (Signed)
   [facility]  Discharge Instructions  AV Fistula or Graft Surgery for Dialysis Access  Please refer to the following instructions for your post-procedure care. Your surgeon or physician assistant will discuss any changes with you.  Activity  You may drive the day following your surgery, if you are comfortable and no longer taking prescription pain medication. Resume full activity as the soreness in your incision resolves.  Bathing/Showering  You may shower after you go home. Keep your incision dry for 48 hours. Do not soak in a bathtub, hot tub, or swim until the incision heals completely. You may not shower if you have a hemodialysis catheter.  Incision Care  Clean your incision with mild soap and water after 48 hours. Pat the area dry with a clean towel. You do not need a bandage unless otherwise instructed. Do not apply any ointments or creams to your incision. You may have skin glue on your incision. Do not peel it off. It will come off on its own in about one week. Your arm may swell a bit after surgery. To reduce swelling use pillows to elevate your arm so it is above your heart. Your doctor will tell you if you need to lightly wrap your arm with an ACE bandage.  Diet  Resume your normal diet. There are not special food restrictions following this procedure. In order to heal from your surgery, it is CRITICAL to get adequate nutrition. Your body requires vitamins, minerals, and protein. Vegetables are the best source of vitamins and minerals. Vegetables also provide the perfect balance of protein. Processed food has little nutritional value, so try to avoid this.  Medications  Resume taking all of your medications. If your incision is causing pain, you may take over-the counter pain relievers such as acetaminophen (Tylenol). If you were prescribed a stronger pain medication, please be aware these medications can cause nausea and constipation. Prevent  nausea by taking the medication with a snack or meal. Avoid constipation by drinking plenty of fluids and eating foods with high amount of fiber, such as fruits, vegetables, and grains.  Do not take Tylenol if you are taking prescription pain medications.  Follow up Your surgeon may want to see you in the office following your access surgery. If so, this will be arranged at the time of your surgery.  Please call us immediately for any of the following conditions:  Increased pain, redness, drainage (pus) from your incision site Fever of 101 degrees or higher Severe or worsening pain at your incision site Hand pain or numbness.  Reduce your risk of vascular disease:  Stop smoking. If you would like help, call [facility] at 1-800-QUIT-NOW (904)340-4521) or [place] at [place] your cholesterol Maintain a desired weight Control your diabetes Keep your blood pressure down  Dialysis  It will take several weeks to several months for your new dialysis access to be ready for use. Your surgeon will determine when it is okay to use it. Your nephrologist will continue to direct your dialysis. You can continue to use your Permcath until your new access is ready for use.   05/20/2022 [name] [name] [id number] Oct 30, 1955  Surgeon(s): [name] [name], [name] Meres, MD  Procedure(s): RIGHT ARM ARTERIOVENOUS (AV) FISTULA CREATION   May stick graft immediately   May stick graft on designated area only:   x Do not stick fistula for 12 weeks    If you have any questions, please call the office at [phone].

## 2022-05-20 NOTE — Transfer of Care (Signed)
Immediate Anesthesia Transfer of Care Note  Patient: [name] [name]  Procedure(s) Performed: RIGHT ARM ARTERIOVENOUS (AV) FISTULA CREATION (Right)  Patient Location: PACU  Anesthesia Type:MAC combined with regional for post-op pain  Level of Consciousness: awake and alert   Airway & Oxygen Therapy: Patient Spontanous Breathing  Post-op Assessment: Report given to RN and Post -op Vital signs reviewed and stable  Post vital signs: Reviewed and stable  Last Vitals:  Vitals Value Taken Time  BP 132/50 05/20/22 1302  Temp    Pulse 66 05/20/22 1305  Resp 19 05/20/22 1305  SpO2 94 % 05/20/22 1305  Vitals shown include unvalidated device data.  Last Pain:  Vitals:   05/20/22 0754  TempSrc: (P) Oral         Complications: No notable events documented.

## 2022-05-20 NOTE — Interval H&P Note (Signed)
History and Physical Interval Note:  05/20/2022 10:40 AM  [name] [name]  has presented today for surgery, with the diagnosis of esrd.  The various methods of treatment have been discussed with the patient and family. After consideration of risks, benefits and other options for treatment, the patient has consented to  Procedure(s): RIGHT ARM ARTERIOVENOUS (AV) FISTULA CREATION (Right) as a surgical intervention.  The patient's history has been reviewed, patient examined, no change in status, stable for surgery.  I have reviewed the patient's chart and labs.  Questions were answered to the patient's satisfaction.     Curt Jews

## 2022-05-20 NOTE — Op Note (Signed)
    OPERATIVE REPORT  DATE OF SURGERY: 05/20/2022  PATIENT: [name] [name], 66 y.o. male MRN: [id number]  DOB: December 03, 1955  PRE-OPERATIVE DIAGNOSIS: End-stage renal disease  POST-OPERATIVE DIAGNOSIS:  Same  PROCEDURE: Right brachiocephalic AV fistula creation  SURGEON:  Curt Jews, M.D.  PHYSICIAN ASSISTANT: Schuh PA-C  The assistant was needed for exposure and to expedite the case  ANESTHESIA: Axillary block and local  EBL: per anesthesia record  Total I/O In: 650 [I.V.:400; IV Piggyback:250] Out: -   BLOOD ADMINISTERED: none  DRAINS: none  SPECIMEN: none  COUNTS CORRECT:  YES  PATIENT DISPOSITION:  PACU - hemodynamically stable  PROCEDURE DETAILS: The patient was taken everyplace [place] to the area of the right arm strep draped in usual sterile fashion.  SonoSite ultrasound was used to visualize the vein which was of very good caliber at the antecubital space.  The had a 2+ brachial pulse.  Had a very faint right radial pulse preoperative.  Using local anesthesia incision was made at the antecubital space and carried down to isolate the cephalic vein which was of good caliber and brachial artery which was also of good caliber.  There was moderate atherosclerotic change of the brachial artery.  The vein was mobilized proximally and distally and tributary branches were ligated and divided.  The vein was ligated distally and brought into approximation with the brachial artery.  The artery was occluded proximally and distally and was opened with an 11 blade and sent longitudinally with Potts scissors.  A small arteriotomy was created to reduce risk of steal.  The vein was cut to the appropriate length and spatulated and sewn end-to-side to the artery with a running 6-0 Prolene suture.  Clamps were removed and excellent thrill was noted through the vein.  Wounds irrigated with saline.  Hemostasis electrocautery.  The wounds were closed with 3-0 Vicryl in the subcutaneous and  subcuticular tissue.  Sterile dressing was applied and the patient was transferred to the recovery room in stable condition   Rosetta Posner, M.D., [facility] 05/20/2022 1:21 PM  Note: Portions of this report may have been transcribed using voice recognition software.  Every effort has been made to ensure accuracy; however, inadvertent computerized transcription errors may still be present.

## 2022-05-20 NOTE — Anesthesia Postprocedure Evaluation (Signed)
Anesthesia Post Note  Patient: [name] [name]  Procedure(s) Performed: RIGHT ARM ARTERIOVENOUS (AV) FISTULA CREATION (Right)     Patient location during evaluation: PACU Anesthesia Type: MAC and Regional Level of consciousness: awake and alert Pain management: pain level controlled Vital Signs Assessment: post-procedure vital signs reviewed and stable Respiratory status: spontaneous breathing, nonlabored ventilation, respiratory function stable and patient connected to nasal cannula oxygen Cardiovascular status: stable and blood pressure returned to baseline Postop Assessment: no apparent nausea or vomiting Anesthetic complications: no   No notable events documented.  Last Vitals:  Vitals:   05/20/22 1330 05/20/22 1345  BP: (!) 122/58 (!) 122/58  Pulse: 60 60  Resp: 18 19  Temp: 36.7 C   SpO2: 92% 93%    Last Pain:  Vitals:   05/20/22 1345  TempSrc:   PainSc: 0-No pain                 [name]

## 2022-05-20 NOTE — Anesthesia Procedure Notes (Addendum)
Procedure Name: General with mask airway Date/Time: 05/20/2022 11:40 AM  Performed by: Erick Colace, CRNAPre-anesthesia Checklist: Patient identified, Emergency Drugs available, Suction available and Patient being monitored Patient Re-evaluated:Patient Re-evaluated prior to induction Oxygen Delivery Method: Simple face mask Preoxygenation: Pre-oxygenation with 100% oxygen Induction Type: IV induction Ventilation: Oral airway inserted - appropriate to patient size Dental Injury: Teeth and Oropharynx as per pre-operative assessment

## 2022-05-21 ENCOUNTER — Encounter (HOSPITAL_COMMUNITY): Payer: Self-pay | Admitting: Vascular Surgery

## 2022-05-21 LAB — GLUCOSE, CAPILLARY: Glucose-Capillary: 180 mg/dL — ABNORMAL HIGH (ref 70–99)

## 2022-05-22 DIAGNOSIS — N186 End stage renal disease: Secondary | ICD-10-CM | POA: Diagnosis not present

## 2022-05-22 DIAGNOSIS — Z992 Dependence on renal dialysis: Secondary | ICD-10-CM | POA: Diagnosis not present

## 2022-05-22 DIAGNOSIS — N179 Acute kidney failure, unspecified: Secondary | ICD-10-CM | POA: Diagnosis not present

## 2022-05-23 DIAGNOSIS — N2581 Secondary hyperparathyroidism of renal origin: Secondary | ICD-10-CM | POA: Diagnosis not present

## 2022-05-23 DIAGNOSIS — N186 End stage renal disease: Secondary | ICD-10-CM | POA: Diagnosis not present

## 2022-05-23 DIAGNOSIS — Z992 Dependence on renal dialysis: Secondary | ICD-10-CM | POA: Diagnosis not present

## 2022-05-26 DIAGNOSIS — Z992 Dependence on renal dialysis: Secondary | ICD-10-CM | POA: Diagnosis not present

## 2022-05-26 DIAGNOSIS — N2581 Secondary hyperparathyroidism of renal origin: Secondary | ICD-10-CM | POA: Diagnosis not present

## 2022-05-26 DIAGNOSIS — N186 End stage renal disease: Secondary | ICD-10-CM | POA: Diagnosis not present

## 2022-05-30 DIAGNOSIS — N186 End stage renal disease: Secondary | ICD-10-CM | POA: Diagnosis not present

## 2022-05-30 DIAGNOSIS — N2581 Secondary hyperparathyroidism of renal origin: Secondary | ICD-10-CM | POA: Diagnosis not present

## 2022-05-30 DIAGNOSIS — Z992 Dependence on renal dialysis: Secondary | ICD-10-CM | POA: Diagnosis not present

## 2022-05-30 DIAGNOSIS — T82898A Other specified complication of vascular prosthetic devices, implants and grafts, initial encounter: Secondary | ICD-10-CM | POA: Diagnosis not present

## 2022-06-02 ENCOUNTER — Telehealth: Payer: Self-pay | Admitting: *Deleted

## 2022-06-02 DIAGNOSIS — Z992 Dependence on renal dialysis: Secondary | ICD-10-CM | POA: Diagnosis not present

## 2022-06-02 DIAGNOSIS — N2581 Secondary hyperparathyroidism of renal origin: Secondary | ICD-10-CM | POA: Diagnosis not present

## 2022-06-02 DIAGNOSIS — N186 End stage renal disease: Secondary | ICD-10-CM | POA: Diagnosis not present

## 2022-06-02 MED ORDER — TORSEMIDE 20 MG PO TABS: 20.0000 mg | ORAL_TABLET | Freq: Every day | ORAL | 0 refills | Status: DC

## 2022-06-02 NOTE — Telephone Encounter (Signed)
Rx refill sent to pharmacy. 

## 2022-06-03 DIAGNOSIS — E782 Mixed hyperlipidemia: Secondary | ICD-10-CM | POA: Diagnosis not present

## 2022-06-03 DIAGNOSIS — Z992 Dependence on renal dialysis: Secondary | ICD-10-CM | POA: Diagnosis not present

## 2022-06-03 DIAGNOSIS — I4891 Unspecified atrial fibrillation: Secondary | ICD-10-CM | POA: Diagnosis not present

## 2022-06-03 DIAGNOSIS — I5020 Unspecified systolic (congestive) heart failure: Secondary | ICD-10-CM | POA: Diagnosis not present

## 2022-06-03 DIAGNOSIS — E1140 Type 2 diabetes mellitus with diabetic neuropathy, unspecified: Secondary | ICD-10-CM | POA: Diagnosis not present

## 2022-06-03 DIAGNOSIS — I2510 Atherosclerotic heart disease of native coronary artery without angina pectoris: Secondary | ICD-10-CM | POA: Diagnosis not present

## 2022-06-03 DIAGNOSIS — N186 End stage renal disease: Secondary | ICD-10-CM | POA: Diagnosis not present

## 2022-06-04 DIAGNOSIS — N186 End stage renal disease: Secondary | ICD-10-CM | POA: Diagnosis not present

## 2022-06-04 DIAGNOSIS — T8249XA Other complication of vascular dialysis catheter, initial encounter: Secondary | ICD-10-CM | POA: Diagnosis not present

## 2022-06-04 DIAGNOSIS — Z992 Dependence on renal dialysis: Secondary | ICD-10-CM | POA: Diagnosis not present

## 2022-06-06 DIAGNOSIS — Z992 Dependence on renal dialysis: Secondary | ICD-10-CM | POA: Diagnosis not present

## 2022-06-06 DIAGNOSIS — N2581 Secondary hyperparathyroidism of renal origin: Secondary | ICD-10-CM | POA: Diagnosis not present

## 2022-06-06 DIAGNOSIS — N186 End stage renal disease: Secondary | ICD-10-CM | POA: Diagnosis not present

## 2022-06-09 DIAGNOSIS — N2581 Secondary hyperparathyroidism of renal origin: Secondary | ICD-10-CM | POA: Diagnosis not present

## 2022-06-09 DIAGNOSIS — N186 End stage renal disease: Secondary | ICD-10-CM | POA: Diagnosis not present

## 2022-06-09 DIAGNOSIS — Z992 Dependence on renal dialysis: Secondary | ICD-10-CM | POA: Diagnosis not present

## 2022-06-10 ENCOUNTER — Other Ambulatory Visit: Payer: Self-pay

## 2022-06-10 MED ORDER — TORSEMIDE 20 MG PO TABS: 20.0000 mg | ORAL_TABLET | Freq: Every day | ORAL | 1 refills | Status: DC

## 2022-06-13 DIAGNOSIS — N186 End stage renal disease: Secondary | ICD-10-CM | POA: Diagnosis not present

## 2022-06-13 DIAGNOSIS — N2581 Secondary hyperparathyroidism of renal origin: Secondary | ICD-10-CM | POA: Diagnosis not present

## 2022-06-13 DIAGNOSIS — Z992 Dependence on renal dialysis: Secondary | ICD-10-CM | POA: Diagnosis not present

## 2022-06-18 ENCOUNTER — Other Ambulatory Visit: Payer: Self-pay | Admitting: *Deleted

## 2022-06-18 DIAGNOSIS — N186 End stage renal disease: Secondary | ICD-10-CM

## 2022-06-20 DIAGNOSIS — N2581 Secondary hyperparathyroidism of renal origin: Secondary | ICD-10-CM | POA: Diagnosis not present

## 2022-06-20 DIAGNOSIS — N186 End stage renal disease: Secondary | ICD-10-CM | POA: Diagnosis not present

## 2022-06-20 DIAGNOSIS — Z992 Dependence on renal dialysis: Secondary | ICD-10-CM | POA: Diagnosis not present

## 2022-06-22 DIAGNOSIS — Z992 Dependence on renal dialysis: Secondary | ICD-10-CM | POA: Diagnosis not present

## 2022-06-22 DIAGNOSIS — N179 Acute kidney failure, unspecified: Secondary | ICD-10-CM | POA: Diagnosis not present

## 2022-06-22 DIAGNOSIS — N186 End stage renal disease: Secondary | ICD-10-CM | POA: Diagnosis not present

## 2022-06-27 DIAGNOSIS — N186 End stage renal disease: Secondary | ICD-10-CM | POA: Diagnosis not present

## 2022-06-27 DIAGNOSIS — N2581 Secondary hyperparathyroidism of renal origin: Secondary | ICD-10-CM | POA: Diagnosis not present

## 2022-06-27 DIAGNOSIS — Z992 Dependence on renal dialysis: Secondary | ICD-10-CM | POA: Diagnosis not present

## 2022-06-30 DIAGNOSIS — N2581 Secondary hyperparathyroidism of renal origin: Secondary | ICD-10-CM | POA: Diagnosis not present

## 2022-06-30 DIAGNOSIS — Z992 Dependence on renal dialysis: Secondary | ICD-10-CM | POA: Diagnosis not present

## 2022-06-30 DIAGNOSIS — N186 End stage renal disease: Secondary | ICD-10-CM | POA: Diagnosis not present

## 2022-07-02 ENCOUNTER — Ambulatory Visit (INDEPENDENT_AMBULATORY_CARE_PROVIDER_SITE_OTHER): Payer: Medicare HMO | Admitting: Physician Assistant

## 2022-07-02 ENCOUNTER — Other Ambulatory Visit: Payer: Self-pay

## 2022-07-02 ENCOUNTER — Encounter: Payer: Self-pay | Admitting: *Deleted

## 2022-07-02 ENCOUNTER — Ambulatory Visit (HOSPITAL_COMMUNITY)
Admission: RE | Admit: 2022-07-02 | Discharge: 2022-07-02 | Disposition: A | Discharge status: Home / Self Care | Payer: Medicare HMO | Source: Ambulatory Visit | Attending: Vascular Surgery | Admitting: Vascular Surgery

## 2022-07-02 VITALS — BP 135/55 | HR 59 | Temp 97.7°F | Ht 70.0 in | Wt 258.0 lb

## 2022-07-02 DIAGNOSIS — N186 End stage renal disease: Secondary | ICD-10-CM

## 2022-07-02 NOTE — H&P (View-Only) (Signed)
Office Note     CC:  follow up Requesting Provider:  Cyndi Bender, PA-C  HPI: [name] [name] is a 67 y.o. (04-16-1956) male who presents status post right brachiocephalic fistula created by Dr. Donnetta Hutching on 05/20/2022.  He is dialyzing via right IJ TDC on a Monday day dialysis schedule.  He denies any steal symptoms in his right hand.  He believes the incision is well-healed and his AC fossa.   Past Medical History:  Diagnosis Date   Anemia 04/17/2022   Blood transfusion   Atherosclerotic heart disease    Benign essential HTN 07/30/2014   Bradycardia 01/25/2015   CHF (congestive heart failure) (HCC)    Chronic anticoagulation 09/32/3557   Chronic systolic CHF (congestive heart failure), NYHA class 3 ([place]) 08/30/2014   Overview:  Global ef 30%   CKD (chronic kidney disease)    Class 3 severe obesity in adult [facility]) 04/28/2017   Coronary artery disease    Diabetes mellitus without complication ([place])    Diabetic neuropathy ([place]) 07/28/2014   Dysrhythmia    Erectile dysfunction    High risk medication use 05/26/2018   Hypertensive heart disease with heart failure ([place]) 07/30/2014   Leukocytosis 11/26/2021   Lumbago    LV dysfunction 04/28/2017   Mixed hyperlipidemia 07/28/2014   OSA (obstructive sleep apnea)    CPAP   Paroxysmal atrial fibrillation ([place]) 07/28/2014   Postop check 01/06/2022   S/P CABG x 3 11/12/2021   Sinus node dysfunction ([place]) 04/21/2016   Testicular hypofunction    Uncontrolled type 2 diabetes mellitus with microalbuminuric diabetic nephropathy 07/28/2014    Past Surgical History:  Procedure Laterality Date   AV FISTULA PLACEMENT Right 05/20/2022   Procedure: RIGHT ARM ARTERIOVENOUS (AV) FISTULA CREATION;  Surgeon: Rosetta Posner, MD;  Location: [place];  Service: Vascular;  Laterality: Right;   BUBBLE STUDY  11/21/2021   Procedure: BUBBLE STUDY;  Surgeon: Larey Dresser, MD;  Location: [place];  Service: Cardiovascular;;   CARDIAC CATHETERIZATION      CARDIOVERSION N/A 11/21/2021   Procedure: CARDIOVERSION;  Surgeon: Larey Dresser, MD;  Location: [place];  Service: Cardiovascular;  Laterality: N/A;   CARDIOVERSION N/A 11/25/2021   Procedure: CARDIOVERSION;  Surgeon: Larey Dresser, MD;  Location: [facility] ENDOSCOPY;  Service: Cardiovascular;  Laterality: N/A;   CARDIOVERSION N/A 11/29/2021   Procedure: CARDIOVERSION;  Surgeon: Larey Dresser, MD;  Location: [facility] ENDOSCOPY;  Service: Cardiovascular;  Laterality: N/A;   CARDIOVERSION N/A 02/27/2022   Procedure: CARDIOVERSION;  Surgeon: Donato Heinz, MD;  Location: [facility] ENDOSCOPY;  Service: Cardiovascular;  Laterality: N/A;   CORONARY ARTERY BYPASS GRAFT N/A 11/12/2021   Procedure: CORONARY ARTERY BYPASS GRAFTING (CABG) x  3 USING LEFT INTERNAL MAMMARY ARTERY AND RIGHT GREATER SAPHENOUS VEIN;  Surgeon: Dahlia Byes, MD;  Location: [place];  Service: Open Heart Surgery;  Laterality: N/A;   ELECTROPHYSIOLOGIC STUDY N/A 01/18/2015   Procedure: CARDIOVERSION;  Surgeon: [name], MD;  Location: [facility]S;  Service: Cardiovascular;  Laterality: N/A;   ENDOVEIN HARVEST OF GREATER SAPHENOUS VEIN  11/12/2021   Procedure: ENDOVEIN HARVEST OF GREATER SAPHENOUS VEIN;  Surgeon: Dahlia Byes, MD;  Location: [place];  Service: Open Heart Surgery;;   EYE SURGERY Bilateral 2022   IR FLUORO GUIDE CV LINE RIGHT  11/28/2021   IR US GUIDE VASC ACCESS RIGHT  11/28/2021   LEFT HEART CATH AND CORONARY ANGIOGRAPHY N/A 10/24/2021   Procedure: LEFT HEART CATH AND CORONARY ANGIOGRAPHY;  Surgeon: Larae Grooms  S, MD;  Location: [place] CV LAB;  Service: Cardiovascular;  Laterality: N/A;   MAZE N/A 11/12/2021   Procedure: MAZE;  Surgeon: Dahlia Byes, MD;  Location: [place];  Service: Open Heart Surgery;  Laterality: N/A;   TEE WITHOUT CARDIOVERSION N/A 11/12/2021   Procedure: TRANSESOPHAGEAL ECHOCARDIOGRAM (TEE);  Surgeon: Dahlia Byes, MD;  Location: [place];  Service: Open Heart  Surgery;  Laterality: N/A;   TEE WITHOUT CARDIOVERSION N/A 11/21/2021   Procedure: TRANSESOPHAGEAL ECHOCARDIOGRAM (TEE);  Surgeon: Larey Dresser, MD;  Location: [facility] ENDOSCOPY;  Service: Cardiovascular;  Laterality: N/A;    Social History   Socioeconomic History   Marital status: Married    Spouse name: Not on file   Number of children: Not on file   Years of education: Not on file   Highest education level: Not on file  Occupational History   Not on file  Tobacco Use   Smoking status: Former    Types: Cigarettes    Quit date: 2007    Years since quitting: 17.0   Smokeless tobacco: Never  Vaping Use   Vaping Use: Never used  Substance and Sexual Activity   Alcohol use: No   Drug use: Yes    Frequency: 1.0 times per week    Types: Marijuana   Sexual activity: Not on file  Other Topics Concern   Not on file  Social History Narrative   Not on file   Social Determinants of Health   Financial Resource Strain: Not on file  Food Insecurity: Not on file  Transportation Needs: Not on file  Physical Activity: Not on file  Stress: Not on file  Social Connections: Not on file  Intimate Partner Violence: Not on file    Family History  Problem Relation Age of Onset   CAD Brother    Drug abuse Brother    Lung cancer Father    CAD Mother     Current Outpatient Medications  Medication Sig Dispense Refill   albuterol (VENTOLIN HFA) 108 (90 Base) MCG/ACT inhaler Inhale 2 puffs into the lungs every 6 (six) hours as needed for wheezing or shortness of breath.     amiodarone (PACERONE) 200 MG tablet Take 1 tablet (200 mg total) by mouth 2 (two) times daily. 180 tablet 3   atorvastatin (LIPITOR) 80 MG tablet Take 1 tablet (80 mg total) by mouth daily. 90 tablet 3   B Complex-C-Folic Acid (DIALYVITE 119) 0.8 MG TABS Take 1 tablet by mouth daily.     Dulaglutide (TRULICITY) 3 ER/7.4YC SOPN Inject 3 mg into the skin every Monday.     ELIQUIS 5 MG TABS tablet TAKE 1 TABLET TWICE  DAILY 180 tablet 1   fluticasone furoate-vilanterol (BREO ELLIPTA) 200-25 MCG/ACT AEPB Inhale 1 puff into the lungs daily.  3   insulin detemir (LEVEMIR) 100 UNIT/ML FlexPen Inject 30 Units into the skin 2 (two) times daily. (Patient taking differently: Inject 40 Units into the skin 2 (two) times daily.) 15 mL 11   Insulin Pen Needle 32G X 4 MM MISC Use to inject Levemir 2 (two) times daily. 100 each 1   metoprolol succinate (TOPROL-XL) 25 MG 24 hr tablet Take 25 mg by mouth daily.     midodrine (PROAMATINE) 5 MG tablet Take 1 tablet (5 mg total) by mouth 3 (three) times daily with meals. (Patient taking differently: Take 5 mg by mouth 2 (two) times daily with a meal.) 270 tablet 3   torsemide (DEMADEX) 20 MG tablet  Take 1 tablet (20 mg total) by mouth daily. 90 tablet 1   traMADol (ULTRAM) 50 MG tablet Take 1 tablet (50 mg total) by mouth every 8 (eight) hours as needed. 12 tablet 0   No current facility-administered medications for this visit.    No Known Allergies   REVIEW OF SYSTEMS:   [X]  denotes positive finding, [ ]  denotes negative finding Cardiac  Comments:  Chest pain or chest pressure:    Shortness of breath upon exertion:    Short of breath when lying flat:    Irregular heart rhythm:        Vascular    Pain in calf, thigh, or hip brought on by ambulation:    Pain in feet at night that wakes you up from your sleep:     Blood clot in your veins:    Leg swelling:         Pulmonary    Oxygen at home:    Productive cough:     Wheezing:         Neurologic    Sudden weakness in arms or legs:     Sudden numbness in arms or legs:     Sudden onset of difficulty speaking or slurred speech:    Temporary loss of vision in one eye:     Problems with dizziness:         Gastrointestinal    Blood in stool:     Vomited blood:         Genitourinary    Burning when urinating:     Blood in urine:        Psychiatric    Major depression:         Hematologic    Bleeding  problems:    Problems with blood clotting too easily:        Skin    Rashes or ulcers:        Constitutional    Fever or chills:      PHYSICAL EXAMINATION:  Vitals:   07/02/22 1407  BP: (!) 135/55  Pulse: (!) 59  Temp: 97.7 F (36.5 C)  TempSrc: Temporal  SpO2: 98%  Weight: 258 lb (117 kg)  Height: 5\' 10"  (1.778 m)    General:  WDWN in NAD; vital signs documented above Gait: Not observed HENT: WNL, normocephalic Pulmonary: normal non-labored breathing , without Rales, rhonchi,  wheezing Cardiac: regular HR Abdomen: soft, NT, no masses Skin: without rashes Vascular Exam/Pulses:  Right Left  Radial 1+ (weak) Not evaluated   Extremities: Palpable thrill at the anastomosis and distal arm; fistula becomes more difficult to palpate in the mid and upper arm Musculoskeletal: no muscle wasting or atrophy  Neurologic: A&O X 3;  No focal weakness or paresthesias are detected Psychiatric:  The pt has Normal affect.   Non-Invasive Vascular Imaging:   Patent right brachiocephalic fistula greater than 6 mm in depth from distal arm to upper arm    ASSESSMENT/PLAN:: 67 y.o. male 6-week status post right arm brachiocephalic fistula creation  Patent right brachiocephalic fistula without any signs or symptoms of steal syndrome in the right hand.  On physical exam as well as an duplex, fistula is borderline to deep to reliably cannulate on a consistent basis.  For this reason we will proceed with superficialization of fistula.  This will be performed by Dr. Donzetta Matters in the next week or so.  He will continue HD via right IJ [facility] for now.   The patient is  aware the risks include but are not limited to: bleeding, infection, steal syndrome, nerve damage, thrombosis, failure to mature, and need for additional procedures.   The patient agrees to proceed with the procedure.    Dagoberto Ligas, PA-C [facility] [phone]  Clinic MD:   Donzetta Matters

## 2022-07-02 NOTE — Progress Notes (Signed)
Office Note     CC:  follow up Requesting Provider:  Cyndi Bender, PA-C  HPI: [name] [name] is a 67 y.o. (Aug 22, 1955) male who presents status post right brachiocephalic fistula created by Dr. Donnetta Hutching on 05/20/2022.  He is dialyzing via right IJ TDC on a Monday day dialysis schedule.  He denies any steal symptoms in his right hand.  He believes the incision is well-healed and his AC fossa.   Past Medical History:  Diagnosis Date   Anemia 04/17/2022   Blood transfusion   Atherosclerotic heart disease    Benign essential HTN 07/30/2014   Bradycardia 01/25/2015   CHF (congestive heart failure) (HCC)    Chronic anticoagulation 38/25/0539   Chronic systolic CHF (congestive heart failure), NYHA class 3 ([place]) 08/30/2014   Overview:  Global ef 30%   CKD (chronic kidney disease)    Class 3 severe obesity in adult [facility]) 04/28/2017   Coronary artery disease    Diabetes mellitus without complication ([place])    Diabetic neuropathy ([place]) 07/28/2014   Dysrhythmia    Erectile dysfunction    High risk medication use 05/26/2018   Hypertensive heart disease with heart failure ([place]) 07/30/2014   Leukocytosis 11/26/2021   Lumbago    LV dysfunction 04/28/2017   Mixed hyperlipidemia 07/28/2014   OSA (obstructive sleep apnea)    CPAP   Paroxysmal atrial fibrillation ([place]) 07/28/2014   Postop check 01/06/2022   S/P CABG x 3 11/12/2021   Sinus node dysfunction ([place]) 04/21/2016   Testicular hypofunction    Uncontrolled type 2 diabetes mellitus with microalbuminuric diabetic nephropathy 07/28/2014    Past Surgical History:  Procedure Laterality Date   AV FISTULA PLACEMENT Right 05/20/2022   Procedure: RIGHT ARM ARTERIOVENOUS (AV) FISTULA CREATION;  Surgeon: Rosetta Posner, MD;  Location: [place];  Service: Vascular;  Laterality: Right;   BUBBLE STUDY  11/21/2021   Procedure: BUBBLE STUDY;  Surgeon: Larey Dresser, MD;  Location: [place];  Service: Cardiovascular;;   CARDIAC CATHETERIZATION      CARDIOVERSION N/A 11/21/2021   Procedure: CARDIOVERSION;  Surgeon: Larey Dresser, MD;  Location: [place];  Service: Cardiovascular;  Laterality: N/A;   CARDIOVERSION N/A 11/25/2021   Procedure: CARDIOVERSION;  Surgeon: Larey Dresser, MD;  Location: [facility] ENDOSCOPY;  Service: Cardiovascular;  Laterality: N/A;   CARDIOVERSION N/A 11/29/2021   Procedure: CARDIOVERSION;  Surgeon: Larey Dresser, MD;  Location: [facility] ENDOSCOPY;  Service: Cardiovascular;  Laterality: N/A;   CARDIOVERSION N/A 02/27/2022   Procedure: CARDIOVERSION;  Surgeon: Donato Heinz, MD;  Location: [facility] ENDOSCOPY;  Service: Cardiovascular;  Laterality: N/A;   CORONARY ARTERY BYPASS GRAFT N/A 11/12/2021   Procedure: CORONARY ARTERY BYPASS GRAFTING (CABG) x  3 USING LEFT INTERNAL MAMMARY ARTERY AND RIGHT GREATER SAPHENOUS VEIN;  Surgeon: Dahlia Byes, MD;  Location: [place];  Service: Open Heart Surgery;  Laterality: N/A;   ELECTROPHYSIOLOGIC STUDY N/A 01/18/2015   Procedure: CARDIOVERSION;  Surgeon: [name], MD;  Location: [facility]S;  Service: Cardiovascular;  Laterality: N/A;   ENDOVEIN HARVEST OF GREATER SAPHENOUS VEIN  11/12/2021   Procedure: ENDOVEIN HARVEST OF GREATER SAPHENOUS VEIN;  Surgeon: Dahlia Byes, MD;  Location: [place];  Service: Open Heart Surgery;;   EYE SURGERY Bilateral 2022   IR FLUORO GUIDE CV LINE RIGHT  11/28/2021   IR US GUIDE VASC ACCESS RIGHT  11/28/2021   LEFT HEART CATH AND CORONARY ANGIOGRAPHY N/A 10/24/2021   Procedure: LEFT HEART CATH AND CORONARY ANGIOGRAPHY;  Surgeon: Larae Grooms  S, MD;  Location: [place] CV [facility];  Service: Cardiovascular;  Laterality: N/A;   MAZE N/A 11/12/2021   Procedure: MAZE;  Surgeon: Dahlia Byes, MD;  Location: [place];  Service: Open Heart Surgery;  Laterality: N/A;   TEE WITHOUT CARDIOVERSION N/A 11/12/2021   Procedure: TRANSESOPHAGEAL ECHOCARDIOGRAM (TEE);  Surgeon: Dahlia Byes, MD;  Location: [place];  Service: Open Heart  Surgery;  Laterality: N/A;   TEE WITHOUT CARDIOVERSION N/A 11/21/2021   Procedure: TRANSESOPHAGEAL ECHOCARDIOGRAM (TEE);  Surgeon: Larey Dresser, MD;  Location: [facility] ENDOSCOPY;  Service: Cardiovascular;  Laterality: N/A;    Social History   Socioeconomic History   Marital status: Married    Spouse name: Not on file   Number of children: Not on file   Years of education: Not on file   Highest education level: Not on file  Occupational History   Not on file  Tobacco Use   Smoking status: Former    Types: Cigarettes    Quit date: 2007    Years since quitting: 17.0   Smokeless tobacco: Never  Vaping Use   Vaping Use: Never used  Substance and Sexual Activity   Alcohol use: No   Drug use: Yes    Frequency: 1.0 times per week    Types: Marijuana   Sexual activity: Not on file  Other Topics Concern   Not on file  Social History Narrative   Not on file   Social Determinants of Health   Financial Resource Strain: Not on file  Food Insecurity: Not on file  Transportation Needs: Not on file  Physical Activity: Not on file  Stress: Not on file  Social Connections: Not on file  Intimate Partner Violence: Not on file    Family History  Problem Relation Age of Onset   CAD Brother    Drug abuse Brother    Lung cancer Father    CAD Mother     Current Outpatient Medications  Medication Sig Dispense Refill   albuterol (VENTOLIN HFA) 108 (90 Base) MCG/ACT inhaler Inhale 2 puffs into the lungs every 6 (six) hours as needed for wheezing or shortness of breath.     amiodarone (PACERONE) 200 MG tablet Take 1 tablet (200 mg total) by mouth 2 (two) times daily. 180 tablet 3   atorvastatin (LIPITOR) 80 MG tablet Take 1 tablet (80 mg total) by mouth daily. 90 tablet 3   B Complex-C-Folic Acid (DIALYVITE 333) 0.8 MG TABS Take 1 tablet by mouth daily.     Dulaglutide (TRULICITY) 3 LK/5.6YB SOPN Inject 3 mg into the skin every Monday.     ELIQUIS 5 MG TABS tablet TAKE 1 TABLET TWICE  DAILY 180 tablet 1   fluticasone furoate-vilanterol (BREO ELLIPTA) 200-25 MCG/ACT AEPB Inhale 1 puff into the lungs daily.  3   insulin detemir (LEVEMIR) 100 UNIT/ML FlexPen Inject 30 Units into the skin 2 (two) times daily. (Patient taking differently: Inject 40 Units into the skin 2 (two) times daily.) 15 mL 11   Insulin Pen Needle 32G X 4 MM MISC Use to inject Levemir 2 (two) times daily. 100 each 1   metoprolol succinate (TOPROL-XL) 25 MG 24 hr tablet Take 25 mg by mouth daily.     midodrine (PROAMATINE) 5 MG tablet Take 1 tablet (5 mg total) by mouth 3 (three) times daily with meals. (Patient taking differently: Take 5 mg by mouth 2 (two) times daily with a meal.) 270 tablet 3   torsemide (DEMADEX) 20 MG tablet  Take 1 tablet (20 mg total) by mouth daily. 90 tablet 1   traMADol (ULTRAM) 50 MG tablet Take 1 tablet (50 mg total) by mouth every 8 (eight) hours as needed. 12 tablet 0   No current facility-administered medications for this visit.    No Known Allergies   REVIEW OF SYSTEMS:   [X]  denotes positive finding, [ ]  denotes negative finding Cardiac  Comments:  Chest pain or chest pressure:    Shortness of breath upon exertion:    Short of breath when lying flat:    Irregular heart rhythm:        Vascular    Pain in calf, thigh, or hip brought on by ambulation:    Pain in feet at night that wakes you up from your sleep:     Blood clot in your veins:    Leg swelling:         Pulmonary    Oxygen at home:    Productive cough:     Wheezing:         Neurologic    Sudden weakness in arms or legs:     Sudden numbness in arms or legs:     Sudden onset of difficulty speaking or slurred speech:    Temporary loss of vision in one eye:     Problems with dizziness:         Gastrointestinal    Blood in stool:     Vomited blood:         Genitourinary    Burning when urinating:     Blood in urine:        Psychiatric    Major depression:         Hematologic    Bleeding  problems:    Problems with blood clotting too easily:        Skin    Rashes or ulcers:        Constitutional    Fever or chills:      PHYSICAL EXAMINATION:  Vitals:   07/02/22 1407  BP: (!) 135/55  Pulse: (!) 59  Temp: 97.7 F (36.5 C)  TempSrc: Temporal  SpO2: 98%  Weight: 258 lb (117 kg)  Height: 5\' 10"  (1.778 m)    General:  WDWN in NAD; vital signs documented above Gait: Not observed HENT: WNL, normocephalic Pulmonary: normal non-labored breathing , without Rales, rhonchi,  wheezing Cardiac: regular HR Abdomen: soft, NT, no masses Skin: without rashes Vascular Exam/Pulses:  Right Left  Radial 1+ (weak) Not evaluated   Extremities: Palpable thrill at the anastomosis and distal arm; fistula becomes more difficult to palpate in the mid and upper arm Musculoskeletal: no muscle wasting or atrophy  Neurologic: A&O X 3;  No focal weakness or paresthesias are detected Psychiatric:  The pt has Normal affect.   Non-Invasive Vascular Imaging:   Patent right brachiocephalic fistula greater than 6 mm in depth from distal arm to upper arm    ASSESSMENT/PLAN:: 67 y.o. male 6-week status post right arm brachiocephalic fistula creation  Patent right brachiocephalic fistula without any signs or symptoms of steal syndrome in the right hand.  On physical exam as well as an duplex, fistula is borderline to deep to reliably cannulate on a consistent basis.  For this reason we will proceed with superficialization of fistula.  This will be performed by Dr. Donzetta Matters in the next week or so.  He will continue HD via right IJ [facility] for now.   The patient is  aware the risks include but are not limited to: bleeding, infection, steal syndrome, nerve damage, thrombosis, failure to mature, and need for additional procedures.   The patient agrees to proceed with the procedure.    Dagoberto Ligas, PA-C [facility] [phone]  Clinic MD:   Donzetta Matters

## 2022-07-04 ENCOUNTER — Telehealth: Payer: Self-pay

## 2022-07-04 DIAGNOSIS — N2581 Secondary hyperparathyroidism of renal origin: Secondary | ICD-10-CM | POA: Diagnosis not present

## 2022-07-04 DIAGNOSIS — N186 End stage renal disease: Secondary | ICD-10-CM | POA: Diagnosis not present

## 2022-07-04 DIAGNOSIS — Z992 Dependence on renal dialysis: Secondary | ICD-10-CM | POA: Diagnosis not present

## 2022-07-04 NOTE — Telephone Encounter (Signed)
Spoke with patient. Informed of arrival time change for surgery on 07/10/22 to 0530 AM. Patient verbalized understanding.

## 2022-07-07 DIAGNOSIS — Z992 Dependence on renal dialysis: Secondary | ICD-10-CM | POA: Diagnosis not present

## 2022-07-07 DIAGNOSIS — N186 End stage renal disease: Secondary | ICD-10-CM | POA: Diagnosis not present

## 2022-07-07 DIAGNOSIS — N2581 Secondary hyperparathyroidism of renal origin: Secondary | ICD-10-CM | POA: Diagnosis not present

## 2022-07-09 ENCOUNTER — Encounter (HOSPITAL_COMMUNITY): Payer: Self-pay | Admitting: Vascular Surgery

## 2022-07-09 ENCOUNTER — Other Ambulatory Visit: Payer: Self-pay

## 2022-07-09 NOTE — Progress Notes (Signed)
patient voiced understanding of new arrival time of 65.

## 2022-07-09 NOTE — Addendum Note (Signed)
Addended by: Estell Harpin B on: 07/09/2022 03:58 PM   Modules accepted: Orders

## 2022-07-09 NOTE — Anesthesia Preprocedure Evaluation (Addendum)
Anesthesia Evaluation  Patient identified by MRN, date of birth, ID band Patient awake    Reviewed: Allergy & Precautions, NPO status , Patient's Chart, lab work & pertinent test results  History of Anesthesia Complications Negative for: history of anesthetic complications  Airway Mallampati: III  TM Distance: >3 FB Neck ROM: Full    Dental  (+) Dental Advisory Given   Pulmonary shortness of breath, sleep apnea and Continuous Positive Airway Pressure Ventilation , former smoker   breath sounds clear to auscultation       Cardiovascular hypertension, Pt. on medications + CAD, + CABG and +CHF  + dysrhythmias  Rhythm:Regular  1. Left ventricular ejection fraction, by estimation, is 55 to 60%. The  left ventricle has normal function. The left ventricle has no regional  wall motion abnormalities but there is septal bounce suggestive of prior  cardiac surgery.   2. Right ventricular systolic function is mildly reduced. The right  ventricular size is normal. Tricuspid regurgitation signal is inadequate  for assessing PA pressure.   3. Left atrial size was mildly dilated. No left atrial/left atrial  appendage thrombus was detected.   4. There is a PFO by color doppler and bubble study.   5. The mitral valve is normal in structure. Mild mitral valve  regurgitation. No evidence of mitral stenosis.   6. The aortic valve is tricuspid. There is mild calcification of the  aortic valve. Aortic valve regurgitation is not visualized. No aortic  stenosis is present.   7. A small pericardial effusion is present, primarily towards the apex.     Neuro/Psych negative neurological ROS  negative psych ROS   GI/Hepatic Neg liver ROS,,,  Endo/Other  diabetes, Insulin Dependent    Renal/GU ESRF and DialysisRenal diseaseLab Results      Component                Value               Date                      CREATININE               4.70 (H)             07/10/2022           Lab Results      Component                Value               Date                      K                        4.5                 07/10/2022            Last hd monday     Musculoskeletal   Abdominal   Peds  Hematology  (+) Blood dyscrasia, anemia Lab Results      Component                Value               Date                      WBC  7.8                 04/17/2022                HGB                      10.9 (L)            07/10/2022                HCT                      32.0 (L)            07/10/2022                MCV                      88                  04/17/2022                PLT                      351                 04/17/2022              Anesthesia Other Findings   Reproductive/Obstetrics                              Anesthesia Physical Anesthesia Plan  ASA: 3  Anesthesia Plan: MAC   Post-op Pain Management: Minimal or no pain anticipated   Induction: Intravenous  PONV Risk Score and Plan: 1 and Propofol infusion and Treatment may vary due to age or medical condition  Airway Management Planned: Nasal Cannula, Natural Airway and Simple Face Mask  Additional Equipment: None  Intra-op Plan:   Post-operative Plan:   Informed Consent: I have reviewed the patients History and Physical, chart, labs and discussed the procedure including the risks, benefits and alternatives for the proposed anesthesia with the patient or authorized representative who has indicated his/her understanding and acceptance.     Dental advisory given  Plan Discussed with: CRNA  Anesthesia Plan Comments: (PAT note written 07/09/2022 by Myra Gianotti, PA-C.  )        Anesthesia Quick Evaluation

## 2022-07-09 NOTE — Progress Notes (Signed)
Modified consent due to change in surgeon.

## 2022-07-09 NOTE — Progress Notes (Signed)
Anesthesia Chart Review:  Case: [id number] Date/Time: 07/10/22 0715   Procedure: RIGHT ARM ARTERIOVENOUS FISTULA SUPERFICIALIZATION (Right)   Anesthesia type: Choice   Pre-op diagnosis: ESRD   Location: [facility] OR ROOM 12 / [place] OR   Surgeons: Waynetta Sandy, MD       DISCUSSION: Patient is a 67 year old male scheduled for the above procedure. S/p right brachiocephalic AV fistula creation on 05/20/2022.  At 07/02/2022 vascular surgery follow-up superficialization of his AVF recommended.  As of 07/02/2022 he was dialyzing via right IJ [facility] MWF schedule.  History includes former smoker, CAD (s/p CABG: LIMA--LAD, SVG-OM1, SVG-PDA, LA MAZE Procedure/RF ablation 11/12/21; had post-operative left retroperitoneal hematoma, s/p aspiration/antibiotics, no growth on cultures; CKD->ESRD), chronic systolic CHF (in setting of afib with normalization of EF after 2016 DCCV; recurrent afib 05/09/21), atrial fibrillation/PAF (diagnosed ~ 07/2014; s/p DCCV 01/18/15, recurrent 05/09/21, s/p DCCV 11/21/21, 11/25/21, 11/27/21, 11/29/21, & 02/27/22, recurrent 04/17/22, planned DCCV 04/24/22 but EKG showed NSR), sinus node dysfunction/bradycardia, HTN, HLD, DM2 with neuropathy, obesity, OSA (uses CPAP), ESRD (CKD progressed to ESRD post CABG, started CRRT 11/15/21-11/20/21, HD started 11/23/21, right IJ [facility] 11/28/21).   Last cardiology visit with Dr. Bettina Gavia was on 05/13/22.  CAD felt stable.  Maintaining sinus rhythm. HF compensated on HD and loop diuretic.   Per VVS instructions (see Letter tab), he was instructed to hold Eliquis for 3 days prior, last dose 07/07/22.  Trulicity is also marked as on hold for surgery, last dose 07/02/22.   He is a same day work-up. Anesthesia team to evaluate on the day of surgery.     VS:  BP Readings from Last 3 Encounters:  07/02/22 (!) 135/55  05/20/22 (!) 122/58  05/13/22 136/60   Pulse Readings from Last 3 Encounters:  07/02/22 (!) 59  05/20/22 60  05/13/22 66    PROVIDERS: Cyndi Bender,  PA-C is PCP  Allegra Lai, MD is EP cardiologist Shirlee More, MD is cardiologist Loralie Champagne, MD is HF cardiologist (consulted post CABG) Gardiner Rhyme, MD is pulmonologist Prescott Gum, Collier Salina, MD is CT surgeon He is also followed by [facility]. HD @ [facility].   LABS: For day of surgery. As of 04/30/22 [facility] CE), Cr 3.80, glucose 327, H/H 8.3/26.6, PLT 342. A1c on 11/08/21 was elevated at 9.3%.      PFTs 11/08/21: FVC 2.44 (53%), post 2.45 (54$). FEV1 1.88 (55%), post 1.98 (58%). DLCO unc 17.96 (67%), cor 18.63 (70%).     IMAGES: CXR 01/06/22: FINDINGS: Stable cardiomegaly. Sternotomy wires are noted. Right internal jugular dialysis catheter is unchanged in position. Mild bibasilar subsegmental atelectasis is noted with small pleural effusions. No pneumothorax is noted. Bony thorax is unremarkable. IMPRESSION: Mild bibasilar subsegmental atelectasis with small pleural effusions.     EKG: 04/24/22:  Normal sinus rhythm with 1st degree A-V block Right bundle branch block Abnormal ECG When compared with ECG of 27-Feb-2022 09:59, No significant change was found Confirmed by Buford Dresser [phone]) on 04/26/2022 11:12:44 PM     CV: Long term monitor 01/20/22-02/01/22: - Patch Wear Time:  12 days and 10 hours (2023-07-31T11:42:52-0400 to 2023-08-12T22:32:43-0400) - Atrial Flutter occurred continuously (100% burden), ranging from 62-138 bpm (avg of 96 bpm).  - There were 6 triggered events all atrial flutter with a rapid ventricular rate. - Isolated VEs were rare (<1.0%), VE Couplets were rare (<1.0%), and no VE Triplets were present.  -  Previously notified: MD notification criteria for First Documentation of Atrial Flutter met - notified Sarah L.  RN on 20 Jan 2022 at 2:16 PM ET (RP).     TEE 11/21/21: IMPRESSIONS   1. Left ventricular ejection fraction, by estimation, is 55 to 60%. The  left ventricle has normal function. The left ventricle has no regional   wall motion abnormalities but there is septal bounce suggestive of prior  cardiac surgery.   2. Right ventricular systolic function is mildly reduced. The right  ventricular size is normal. Tricuspid regurgitation signal is inadequate  for assessing PA pressure.   3. Left atrial size was mildly dilated. No left atrial/left atrial  appendage thrombus was detected.   4. There is a PFO by color doppler and bubble study.   5. The mitral valve is normal in structure. Mild mitral valve  regurgitation. No evidence of mitral stenosis.   6. The aortic valve is tricuspid. There is mild calcification of the  aortic valve. Aortic valve regurgitation is not visualized. No aortic  stenosis is present.   7. A small pericardial effusion is present, primarily towards the apex.      TTE 11/13/21: IMPRESSIONS   1. Left ventricular ejection fraction, by estimation, is 55 to 60%. The  left ventricle has normal function. Left ventricular endocardial border  not optimally defined to evaluate regional wall motion. Left ventricular  diastolic parameters are consistent  with Grade II diastolic dysfunction (pseudonormalization).   2. Peak RV-RA gradient 28 mmHg. Right ventricular systolic function was  not well visualized. The right ventricular size is not well visualized.   3. The mitral valve was not well visualized. No evidence of mitral valve  regurgitation. No evidence of mitral stenosis.   4. The aortic valve is tricuspid. There is mild calcification of the  aortic valve. Aortic valve regurgitation is not visualized. Aortic valve  sclerosis is present, with no evidence of aortic valve stenosis.   5. No significant pericardial effusion.   6. The heart is poorly visualized. Grossly, LV systolic function appears  to be in the normal range.  - Comparison LVEF 50-55% 07/30/21; 60-65% 04/12/18; 30% 08/11/14.      US Carotid 11/08/21: Summary:  - Right Carotid: Velocities in the right ICA are consistent with  a 1-39%  stenosis.  - Left Carotid: Velocities in the left ICA are consistent with a 1-39%  stenosis.  - Vertebrals:  Bilateral vertebral arteries demonstrate antegrade flow.  - Subclavians: Normal flow hemodynamics were seen in bilateral subclavian               arteries.      LHC 10/24/21 (PRE-CABG):   Ost LM to Mid LM lesion is 50% stenosed.   Dist LM lesion is 75% stenosed.   Ost LAD to Prox LAD lesion is 50% stenosed.  Ectatic segment with eccentric calcium.   Ost Cx to Prox Cx lesion is 70% stenosed.   Mid LAD lesion is 50% stenosed.   Prox RCA to Mid RCA lesion is 70% stenosed.   LV end diastolic pressure is normal.   There is no aortic valve stenosis.   - Eccentric, severely calcified left main disease extending into the ostial LAD.  There is also ostial disease and a relatively small circumflex vessel.  Diffuse, calcific disease in the mid RCA today. - Patient was initially scheduled for A-fib ablation.  Plan now for cardiac surgery consult with consideration of CABG, maze and left atrial appendage clip.     CT Cardiac (ordered for anticipated ablation) 10/18/21: IMPRESSION: 1. There is normal pulmonary vein  drainage into the left atrium. No pulmonary vein stenosis. 2. Normal left atrial appendage, no left atrial appendage thrombus. No intracardiac mass or thrombus. 3. The esophagus runs in the left atrial midline and is not in the proximity to any of the pulmonary veins. 4. Coronary artery calcium score is 4439, which is 99th percentile for age and sex matched peers. Dense 3 vessel coronary calcifications involving the proximal vessels including left main coronary artery. 5. Mild dilation of main pulmonary artery, 30 mm, may indicate increased pulmonary pressure. - Referred for LHC by Dr. Curt Bears.    Past Medical History:  Diagnosis Date   Anemia 04/17/2022   Blood transfusion   Atherosclerotic heart disease    Benign essential HTN 07/30/2014   Bradycardia  01/25/2015   CHF (congestive heart failure) (HCC)    Chronic anticoagulation 63/14/9702   Chronic systolic CHF (congestive heart failure), NYHA class 3 ([place]) 08/30/2014   Overview:  Global ef 30%   CKD (chronic kidney disease)    Class 3 severe obesity in adult [facility]) 04/28/2017   Coronary artery disease    Diabetes mellitus without complication ([place])    Diabetic neuropathy ([place]) 07/28/2014   Dysrhythmia    Erectile dysfunction    High risk medication use 05/26/2018   Hypertensive heart disease with heart failure ([place]) 07/30/2014   Leukocytosis 11/26/2021   Lumbago    LV dysfunction 04/28/2017   Mixed hyperlipidemia 07/28/2014   OSA (obstructive sleep apnea)    CPAP   Paroxysmal atrial fibrillation ([place]) 07/28/2014   Postop check 01/06/2022   S/P CABG x 3 11/12/2021   Sinus node dysfunction ([place]) 04/21/2016   Testicular hypofunction    Uncontrolled type 2 diabetes mellitus with microalbuminuric diabetic nephropathy 07/28/2014    Past Surgical History:  Procedure Laterality Date   AV FISTULA PLACEMENT Right 05/20/2022   Procedure: RIGHT ARM ARTERIOVENOUS (AV) FISTULA CREATION;  Surgeon: Rosetta Posner, MD;  Location: [place];  Service: Vascular;  Laterality: Right;   BUBBLE STUDY  11/21/2021   Procedure: BUBBLE STUDY;  Surgeon: Larey Dresser, MD;  Location: [place];  Service: Cardiovascular;;   CARDIAC CATHETERIZATION     CARDIOVERSION N/A 11/21/2021   Procedure: CARDIOVERSION;  Surgeon: Larey Dresser, MD;  Location: [place];  Service: Cardiovascular;  Laterality: N/A;   CARDIOVERSION N/A 11/25/2021   Procedure: CARDIOVERSION;  Surgeon: Larey Dresser, MD;  Location: [facility] ENDOSCOPY;  Service: Cardiovascular;  Laterality: N/A;   CARDIOVERSION N/A 11/29/2021   Procedure: CARDIOVERSION;  Surgeon: Larey Dresser, MD;  Location: [facility] ENDOSCOPY;  Service: Cardiovascular;  Laterality: N/A;   CARDIOVERSION N/A 02/27/2022   Procedure: CARDIOVERSION;  Surgeon: Donato Heinz, MD;  Location: [facility] ENDOSCOPY;  Service: Cardiovascular;  Laterality: N/A;   CORONARY ARTERY BYPASS GRAFT N/A 11/12/2021   Procedure: CORONARY ARTERY BYPASS GRAFTING (CABG) x  3 USING LEFT INTERNAL MAMMARY ARTERY AND RIGHT GREATER SAPHENOUS VEIN;  Surgeon: Dahlia Byes, MD;  Location: [place];  Service: Open Heart Surgery;  Laterality: N/A;   ELECTROPHYSIOLOGIC STUDY N/A 01/18/2015   Procedure: CARDIOVERSION;  Surgeon: [name], MD;  Location: [facility] ORS;  Service: Cardiovascular;  Laterality: N/A;   ENDOVEIN HARVEST OF GREATER SAPHENOUS VEIN  11/12/2021   Procedure: ENDOVEIN HARVEST OF GREATER SAPHENOUS VEIN;  Surgeon: Dahlia Byes, MD;  Location: [place];  Service: Open Heart Surgery;;   EYE SURGERY Bilateral 2022   IR FLUORO GUIDE CV LINE RIGHT  11/28/2021   IR US GUIDE VASC ACCESS RIGHT  11/28/2021   LEFT HEART CATH AND CORONARY ANGIOGRAPHY N/A 10/24/2021   Procedure: LEFT HEART CATH AND CORONARY ANGIOGRAPHY;  Surgeon: Jettie Booze, MD;  Location: [place] CV LAB;  Service: Cardiovascular;  Laterality: N/A;   MAZE N/A 11/12/2021   Procedure: MAZE;  Surgeon: Dahlia Byes, MD;  Location: [place];  Service: Open Heart Surgery;  Laterality: N/A;   TEE WITHOUT CARDIOVERSION N/A 11/12/2021   Procedure: TRANSESOPHAGEAL ECHOCARDIOGRAM (TEE);  Surgeon: Dahlia Byes, MD;  Location: [place];  Service: Open Heart Surgery;  Laterality: N/A;   TEE WITHOUT CARDIOVERSION N/A 11/21/2021   Procedure: TRANSESOPHAGEAL ECHOCARDIOGRAM (TEE);  Surgeon: Larey Dresser, MD;  Location: [facility] ENDOSCOPY;  Service: Cardiovascular;  Laterality: N/A;    MEDICATIONS: No current facility-administered medications for this encounter.    albuterol (VENTOLIN HFA) 108 (90 Base) MCG/ACT inhaler   amiodarone (PACERONE) 200 MG tablet   atorvastatin (LIPITOR) 80 MG tablet   B Complex-C-Folic Acid (DIALYVITE 939) 0.8 MG TABS   fluticasone furoate-vilanterol (BREO ELLIPTA) 200-25 MCG/ACT AEPB    insulin detemir (LEVEMIR) 100 UNIT/ML FlexPen   metoprolol succinate (TOPROL-XL) 25 MG 24 hr tablet   midodrine (PROAMATINE) 5 MG tablet   torsemide (DEMADEX) 20 MG tablet   Dulaglutide (TRULICITY) 3 QZ/0.0PQ SOPN   ELIQUIS 5 MG TABS tablet   Insulin Pen Needle 32G X 4 MM MISC   traMADol (ULTRAM) 50 MG tablet    Myra Gianotti, PA-C Surgical Short Stay/Anesthesiology [facility] Phone [phone] [facility] Phone [phone] 07/09/2022 1:45 PM

## 2022-07-09 NOTE — Progress Notes (Signed)
PCP - Cyndi Bender, PA-C Cardiologist - Shirlee More, MD  PPM/ICD - denies  Chest x-ray - 01/06/22 EKG - 04/24/22 Stress Test - 08/11/14 ECHO - 11/21/21 Cardiac Cath - 10/24/21  CPAP - yes - 13  Fasting Blood Sugar - patient is checking very rare CBG at home GLP-1 Agonist - Trulicity - last dose on 07/02/22 per patient  Blood Thinner Instructions: Eliquis - last dose - 07/07/22 Aspirin Instructions: Patient was instructed: As of today, STOP taking any Aspirin (unless otherwise instructed by your surgeon) Aleve, Naproxen, Ibuprofen, Motrin, Advil, Goody's, BC's, all herbal medications, fish oil, and all vitamins.  ERAS Protcol - n/a  COVID TEST- n/a  Anesthesia review: yes - cardiac history  Patient verbally denies any shortness of breath, fever, cough and chest pain during phone call   -------------  SDW INSTRUCTIONS given:  Your procedure is scheduled on Thursday, January 18th, 2024.  Report to [facility] Main Entrance "A" at 05:30 A.M., and check in at the Admitting office.  Call this number if you have problems the morning of surgery:  [phone]   Remember:  Do not eat or drink after midnight the night before your surgery    Take these medicines the morning of surgery with A SIP OF WATER: Amiodarone, Lipitor, Metoprolol, inhalers  Please bring all inhalers with you the day of surgery.     THE NIGHT BEFORE SURGERY, take 20 units of insulin detemir (LEVEMIR) - 50% of your regular dose      THE MORNING OF SURGERY, take 20 units of insulin detemir (LEVEMIR) - 50% of your regular dose   How do I manage my blood sugar before surgery? Check your blood sugar at least 4 times a day, starting 2 days before surgery, to make sure that the level is not too high or low.  Check your blood sugar the morning of your surgery when you wake up and every 2 hours until you get to the Short Stay unit.  If your blood sugar is less than 70 mg/dL, you will need to treat for low  blood sugar: Do not take insulin. Treat a low blood sugar (less than 70 mg/dL) with  cup of clear juice (cranberry or apple), 4 glucose tablets, OR glucose gel. Recheck blood sugar in 15 minutes after treatment (to make sure it is greater than 70 mg/dL). If your blood sugar is not greater than 70 mg/dL on recheck, call [phone] for further instructions. Report your blood sugar to the short stay nurse when you get to Short Stay.  The day of surgery:                  Do not wear jewelry,             Do not wear lotions, powders, colognes, or deodorant.            Men may shave face and neck.            Do not bring valuables to the hospital.            [facility] is not responsible for any belongings or valuables.  Do NOT Smoke (Tobacco/Vaping) 24 hours prior to your procedure If you use a CPAP at night, you may bring all equipment for your overnight stay.   Contacts, glasses, dentures or bridgework may not be worn into surgery.      For patients admitted to the hospital, discharge time will be determined by your treatment team.  Patients discharged the day of surgery will not be allowed to drive home, and someone needs to stay with them for 24 hours.    Special instructions:   [place]- Preparing For Surgery  Before surgery, you can play an important role. Because skin is not sterile, your skin needs to be as free of germs as possible. You can reduce the number of germs on your skin by washing with CHG (chlorahexidine gluconate) Soap before surgery.  CHG is an antiseptic cleaner which kills germs and bonds with the skin to continue killing germs even after washing.    Oral Hygiene is also important to reduce your risk of infection.  Remember - BRUSH YOUR TEETH THE MORNING OF SURGERY WITH YOUR REGULAR TOOTHPASTE  Please do not use if you have an allergy to CHG or antibacterial soaps. If your skin becomes reddened/irritated stop using the CHG.  Do not shave (including legs and  underarms) for at least 48 hours prior to first CHG shower. It is OK to shave your face.  Please follow these instructions carefully.   Shower the NIGHT BEFORE SURGERY and the MORNING OF SURGERY with DIAL Soap.   Pat yourself dry with a CLEAN TOWEL.  Wear CLEAN PAJAMAS to bed the night before surgery  Place CLEAN SHEETS on your bed the night of your first shower and DO NOT SLEEP WITH PETS.   Day of Surgery: Please shower morning of surgery  Wear Clean/Comfortable clothing the morning of surgery Do not apply any deodorants/lotions.   Remember to brush your teeth WITH YOUR REGULAR TOOTHPASTE.   Questions were answered. Patient verbalized understanding of instructions.

## 2022-07-10 ENCOUNTER — Encounter (HOSPITAL_COMMUNITY)
Admission: RE | Disposition: A | Discharge status: Home / Self Care | Payer: Self-pay | Source: Home / Self Care | Attending: Vascular Surgery

## 2022-07-10 ENCOUNTER — Other Ambulatory Visit: Payer: Self-pay

## 2022-07-10 ENCOUNTER — Ambulatory Visit (HOSPITAL_COMMUNITY): Payer: Medicare HMO | Admitting: Vascular Surgery

## 2022-07-10 ENCOUNTER — Ambulatory Visit (HOSPITAL_BASED_OUTPATIENT_CLINIC_OR_DEPARTMENT_OTHER): Payer: Medicare HMO | Admitting: Vascular Surgery

## 2022-07-10 ENCOUNTER — Ambulatory Visit (HOSPITAL_COMMUNITY)
Admission: RE | Admit: 2022-07-10 | Discharge: 2022-07-10 | Disposition: A | Discharge status: Home / Self Care | Payer: Medicare HMO | Attending: Vascular Surgery | Admitting: Vascular Surgery

## 2022-07-10 ENCOUNTER — Other Ambulatory Visit: Payer: Self-pay | Admitting: Cardiothoracic Surgery

## 2022-07-10 DIAGNOSIS — T82898A Other specified complication of vascular prosthetic devices, implants and grafts, initial encounter: Secondary | ICD-10-CM | POA: Diagnosis not present

## 2022-07-10 DIAGNOSIS — I2510 Atherosclerotic heart disease of native coronary artery without angina pectoris: Secondary | ICD-10-CM | POA: Diagnosis not present

## 2022-07-10 DIAGNOSIS — N185 Chronic kidney disease, stage 5: Secondary | ICD-10-CM | POA: Diagnosis not present

## 2022-07-10 DIAGNOSIS — I509 Heart failure, unspecified: Secondary | ICD-10-CM | POA: Diagnosis not present

## 2022-07-10 DIAGNOSIS — Z794 Long term (current) use of insulin: Secondary | ICD-10-CM | POA: Diagnosis not present

## 2022-07-10 DIAGNOSIS — Z992 Dependence on renal dialysis: Secondary | ICD-10-CM | POA: Insufficient documentation

## 2022-07-10 DIAGNOSIS — E1122 Type 2 diabetes mellitus with diabetic chronic kidney disease: Secondary | ICD-10-CM | POA: Insufficient documentation

## 2022-07-10 DIAGNOSIS — Z951 Presence of aortocoronary bypass graft: Secondary | ICD-10-CM | POA: Insufficient documentation

## 2022-07-10 DIAGNOSIS — Z79899 Other long term (current) drug therapy: Secondary | ICD-10-CM | POA: Insufficient documentation

## 2022-07-10 DIAGNOSIS — I132 Hypertensive heart and chronic kidney disease with heart failure and with stage 5 chronic kidney disease, or end stage renal disease: Secondary | ICD-10-CM | POA: Insufficient documentation

## 2022-07-10 DIAGNOSIS — N186 End stage renal disease: Secondary | ICD-10-CM | POA: Insufficient documentation

## 2022-07-10 DIAGNOSIS — D631 Anemia in chronic kidney disease: Secondary | ICD-10-CM | POA: Diagnosis not present

## 2022-07-10 DIAGNOSIS — Z87891 Personal history of nicotine dependence: Secondary | ICD-10-CM

## 2022-07-10 DIAGNOSIS — T829XXA Unspecified complication of cardiac and vascular prosthetic device, implant and graft, initial encounter: Secondary | ICD-10-CM | POA: Diagnosis not present

## 2022-07-10 DIAGNOSIS — Z7985 Long-term (current) use of injectable non-insulin antidiabetic drugs: Secondary | ICD-10-CM | POA: Diagnosis not present

## 2022-07-10 DIAGNOSIS — S301XXA Contusion of abdominal wall, initial encounter: Secondary | ICD-10-CM

## 2022-07-10 DIAGNOSIS — I5022 Chronic systolic (congestive) heart failure: Secondary | ICD-10-CM | POA: Diagnosis not present

## 2022-07-10 DIAGNOSIS — G4733 Obstructive sleep apnea (adult) (pediatric): Secondary | ICD-10-CM | POA: Insufficient documentation

## 2022-07-10 HISTORY — PX: FISTULA SUPERFICIALIZATION: SHX6341

## 2022-07-10 LAB — GLUCOSE, CAPILLARY
Glucose-Capillary: 137 mg/dL — ABNORMAL HIGH (ref 70–99)
Glucose-Capillary: 147 mg/dL — ABNORMAL HIGH (ref 70–99)
Glucose-Capillary: 131 mg/dL — ABNORMAL HIGH (ref 70–99)
Glucose-Capillary: 138 mg/dL — ABNORMAL HIGH (ref 70–99)

## 2022-07-10 LAB — POCT I-STAT, CHEM 8
BUN: 48 mg/dL — ABNORMAL HIGH (ref 8–23)
Calcium, Ion: 0.98 mmol/L — ABNORMAL LOW (ref 1.15–1.40)
Chloride: 107 mmol/L (ref 98–111)
Creatinine, Ser: 4.7 mg/dL — ABNORMAL HIGH (ref 0.61–1.24)
Glucose, Bld: 160 mg/dL — ABNORMAL HIGH (ref 70–99)
HCT: 32 % — ABNORMAL LOW (ref 39.0–52.0)
Hemoglobin: 10.9 g/dL — ABNORMAL LOW (ref 13.0–17.0)
Potassium: 4.5 mmol/L (ref 3.5–5.1)
Sodium: 139 mmol/L (ref 135–145)
TCO2: 23 mmol/L (ref 22–32)

## 2022-07-10 SURGERY — FISTULA SUPERFICIALIZATION
Anesthesia: Monitor Anesthesia Care | Site: Arm Upper | Laterality: Right

## 2022-07-10 MED ORDER — SODIUM CHLORIDE 0.9 % IV SOLN: INTRAVENOUS | Status: DC

## 2022-07-10 MED ORDER — CEFAZOLIN SODIUM-DEXTROSE 2-4 GM/100ML-% IV SOLN
2.0000 g | INTRAVENOUS | Status: AC
  Administered 2022-07-10: 2 g via INTRAVENOUS
  Filled 2022-07-10: qty 100

## 2022-07-10 MED ORDER — PROPOFOL 1000 MG/100ML IV EMUL
INTRAVENOUS | Status: AC
  Filled 2022-07-10: qty 100

## 2022-07-10 MED ORDER — CHLORHEXIDINE GLUCONATE 0.12 % MT SOLN
15.0000 mL | Freq: Once | OROMUCOSAL | Status: AC
  Administered 2022-07-10: 15 mL via OROMUCOSAL

## 2022-07-10 MED ORDER — HEPARIN 6000 UNIT IRRIGATION SOLUTION
Status: AC
  Filled 2022-07-10: qty 500

## 2022-07-10 MED ORDER — ORAL CARE MOUTH RINSE: 15.0000 mL | Freq: Once | OROMUCOSAL | Status: AC

## 2022-07-10 MED ORDER — PROPOFOL 10 MG/ML IV BOLUS
INTRAVENOUS | Status: AC
  Filled 2022-07-10: qty 20

## 2022-07-10 MED ORDER — ACETAMINOPHEN 500 MG PO TABS: 1000.0000 mg | ORAL_TABLET | Freq: Once | ORAL | Status: DC | PRN

## 2022-07-10 MED ORDER — CHLORHEXIDINE GLUCONATE 4 % EX LIQD: 60.0000 mL | Freq: Once | CUTANEOUS | Status: DC

## 2022-07-10 MED ORDER — 0.9 % SODIUM CHLORIDE (POUR BTL) OPTIME
TOPICAL | Status: DC | PRN
  Administered 2022-07-10: 1000 mL

## 2022-07-10 MED ORDER — MIDAZOLAM HCL 2 MG/2ML IJ SOLN
INTRAMUSCULAR | Status: DC | PRN
  Administered 2022-07-10: 1 mg via INTRAVENOUS

## 2022-07-10 MED ORDER — INSULIN ASPART 100 UNIT/ML IJ SOLN: 0.0000 [IU] | INTRAMUSCULAR | Status: DC | PRN

## 2022-07-10 MED ORDER — LIDOCAINE-EPINEPHRINE (PF) 1 %-1:200000 IJ SOLN
INTRAMUSCULAR | Status: DC | PRN
  Administered 2022-07-10: 27 mL

## 2022-07-10 MED ORDER — HEPARIN 6000 UNIT IRRIGATION SOLUTION
Status: DC | PRN
  Administered 2022-07-10: 1

## 2022-07-10 MED ORDER — FENTANYL CITRATE (PF) 100 MCG/2ML IJ SOLN: 25.0000 ug | INTRAMUSCULAR | Status: DC | PRN

## 2022-07-10 MED ORDER — ACETAMINOPHEN 160 MG/5ML PO SOLN: 1000.0000 mg | Freq: Once | ORAL | Status: DC | PRN

## 2022-07-10 MED ORDER — PROPOFOL 10 MG/ML IV BOLUS
INTRAVENOUS | Status: DC | PRN
  Administered 2022-07-10: 15 mg via INTRAVENOUS
  Administered 2022-07-10: 25 mg via INTRAVENOUS

## 2022-07-10 MED ORDER — ACETAMINOPHEN 10 MG/ML IV SOLN: 1000.0000 mg | Freq: Once | INTRAVENOUS | Status: DC | PRN

## 2022-07-10 MED ORDER — ONDANSETRON HCL 4 MG/2ML IJ SOLN
INTRAMUSCULAR | Status: AC
  Filled 2022-07-10: qty 2

## 2022-07-10 MED ORDER — OXYCODONE HCL 5 MG PO TABS: 5.0000 mg | ORAL_TABLET | Freq: Once | ORAL | Status: DC | PRN

## 2022-07-10 MED ORDER — PHENYLEPHRINE HCL-NACL 20-0.9 MG/250ML-% IV SOLN
INTRAVENOUS | Status: DC | PRN
  Administered 2022-07-10: 25 ug/min via INTRAVENOUS

## 2022-07-10 MED ORDER — ONDANSETRON HCL 4 MG/2ML IJ SOLN
INTRAMUSCULAR | Status: DC | PRN
  Administered 2022-07-10: 4 mg via INTRAVENOUS

## 2022-07-10 MED ORDER — CHLORHEXIDINE GLUCONATE 0.12 % MT SOLN
OROMUCOSAL | Status: AC
  Filled 2022-07-10: qty 15

## 2022-07-10 MED ORDER — MIDAZOLAM HCL 2 MG/2ML IJ SOLN
INTRAMUSCULAR | Status: AC
  Filled 2022-07-10: qty 2

## 2022-07-10 MED ORDER — TRAMADOL HCL 50 MG PO TABS: 50.0000 mg | ORAL_TABLET | Freq: Three times a day (TID) | ORAL | 0 refills | Status: DC | PRN

## 2022-07-10 MED ORDER — PROPOFOL 500 MG/50ML IV EMUL
INTRAVENOUS | Status: DC | PRN
  Administered 2022-07-10: 100 ug/kg/min via INTRAVENOUS

## 2022-07-10 MED ORDER — PHENYLEPHRINE 80 MCG/ML (10ML) SYRINGE FOR IV PUSH (FOR BLOOD PRESSURE SUPPORT)
PREFILLED_SYRINGE | INTRAVENOUS | Status: DC | PRN
  Administered 2022-07-10: 160 ug via INTRAVENOUS
  Administered 2022-07-10: 80 ug via INTRAVENOUS

## 2022-07-10 MED ORDER — LIDOCAINE-EPINEPHRINE (PF) 1 %-1:200000 IJ SOLN
INTRAMUSCULAR | Status: AC
  Filled 2022-07-10: qty 30

## 2022-07-10 MED ORDER — LIDOCAINE HCL (PF) 1 % IJ SOLN
INTRAMUSCULAR | Status: AC
  Filled 2022-07-10: qty 30

## 2022-07-10 MED ORDER — OXYCODONE HCL 5 MG/5ML PO SOLN: 5.0000 mg | Freq: Once | ORAL | Status: DC | PRN

## 2022-07-10 SURGICAL SUPPLY — 37 items
ARMBAND PINK RESTRICT EXTREMIT (MISCELLANEOUS) ×1 IMPLANT
BAG COUNTER SPONGE SURGICOUNT (BAG) ×1 IMPLANT
BIOPATCH RED 1 DISK 7.0 (GAUZE/BANDAGES/DRESSINGS) IMPLANT
CANISTER SUCT 3000ML PPV (MISCELLANEOUS) ×1 IMPLANT
CLIP LIGATING EXTRA MED SLVR (CLIP) ×1 IMPLANT
CLIP LIGATING EXTRA SM BLUE (MISCELLANEOUS) ×1 IMPLANT
COVER PROBE W GEL 5X96 (DRAPES) ×1 IMPLANT
DERMABOND ADVANCED .7 DNX12 (GAUZE/BANDAGES/DRESSINGS) ×1 IMPLANT
DRSG COVADERM 4X6 (GAUZE/BANDAGES/DRESSINGS) IMPLANT
ELECT REM PT RETURN 9FT ADLT (ELECTROSURGICAL) ×1
ELECTRODE REM PT RTRN 9FT ADLT (ELECTROSURGICAL) ×1 IMPLANT
GAUZE SPONGE 2X2 8PLY STRL LF (GAUZE/BANDAGES/DRESSINGS) IMPLANT
GLOVE BIO SURGEON STRL SZ7.5 (GLOVE) ×1 IMPLANT
GLOVE BIOGEL PI IND STRL 6.5 (GLOVE) IMPLANT
GLOVE BIOGEL PI IND STRL 7.0 (GLOVE) IMPLANT
GOWN STRL REUS W/ TWL LRG LVL3 (GOWN DISPOSABLE) ×2 IMPLANT
GOWN STRL REUS W/ TWL XL LVL3 (GOWN DISPOSABLE) ×1 IMPLANT
GOWN STRL REUS W/TWL LRG LVL3 (GOWN DISPOSABLE) ×2
GOWN STRL REUS W/TWL XL LVL3 (GOWN DISPOSABLE) ×1
KIT BASIN OR (CUSTOM PROCEDURE TRAY) ×1 IMPLANT
KIT TURNOVER KIT B (KITS) ×1 IMPLANT
LOOP VESSEL MAXI BLUE (MISCELLANEOUS) IMPLANT
NS IRRIG 1000ML POUR BTL (IV SOLUTION) ×1 IMPLANT
PACK CV ACCESS (CUSTOM PROCEDURE TRAY) ×1 IMPLANT
PAD ARMBOARD 7.5X6 YLW CONV (MISCELLANEOUS) ×2 IMPLANT
POWDER SURGICEL 3.0 GRAM (HEMOSTASIS) IMPLANT
SLING ARM FOAM STRAP LRG (SOFTGOODS) IMPLANT
SLING ARM FOAM STRAP MED (SOFTGOODS) IMPLANT
SUT MNCRL AB 4-0 PS2 18 (SUTURE) ×1 IMPLANT
SUT PROLENE 5 0 C 1 24 (SUTURE) IMPLANT
SUT PROLENE 6 0 BV (SUTURE) ×1 IMPLANT
SUT VIC AB 3-0 SH 27 (SUTURE) ×4
SUT VIC AB 3-0 SH 27X BRD (SUTURE) ×1 IMPLANT
SYR BULB IRRIG 60ML STRL (SYRINGE) IMPLANT
TOWEL GREEN STERILE (TOWEL DISPOSABLE) ×1 IMPLANT
UNDERPAD 30X36 HEAVY ABSORB (UNDERPADS AND DIAPERS) ×1 IMPLANT
WATER STERILE IRR 1000ML POUR (IV SOLUTION) ×1 IMPLANT

## 2022-07-10 NOTE — Interval H&P Note (Signed)
History and Physical Interval Note:  07/10/2022 11:27 AM  [name] [name]  has presented today for surgery, with the diagnosis of ESRD.  The various methods of treatment have been discussed with the patient and family. After consideration of risks, benefits and other options for treatment, the patient has consented to  Procedure(s): RIGHT ARM ARTERIOVENOUS FISTULA SUPERFICIALIZATION (Right) as a surgical intervention.  The patient's history has been reviewed, patient examined, no change in status, stable for surgery.  I have reviewed the patient's chart and labs.  Questions were answered to the patient's satisfaction.     Servando Snare

## 2022-07-10 NOTE — Discharge Instructions (Signed)
   [facility]  Discharge Instructions  AV Fistula or Graft Surgery for Dialysis Access  Please refer to the following instructions for your post-procedure care. Your surgeon or physician assistant will discuss any changes with you.  Activity  You may drive the day following your surgery, if you are comfortable and no longer taking prescription pain medication. Resume full activity as the soreness in your incision resolves.  Bathing/Showering  You may shower after you go home. Keep your incision dry for 48 hours. Do not soak in a bathtub, hot tub, or swim until the incision heals completely. You may not shower if you have a hemodialysis catheter.  Incision Care  Clean your incision with mild soap and water after 48 hours. Pat the area dry with a clean towel. You do not need a bandage unless otherwise instructed. Do not apply any ointments or creams to your incision. You may have skin glue on your incision. Do not peel it off. It will come off on its own in about one week. Your arm may swell a bit after surgery. To reduce swelling use pillows to elevate your arm so it is above your heart. Your doctor will tell you if you need to lightly wrap your arm with an ACE bandage.  Diet  Resume your normal diet. There are not special food restrictions following this procedure. In order to heal from your surgery, it is CRITICAL to get adequate nutrition. Your body requires vitamins, minerals, and protein. Vegetables are the best source of vitamins and minerals. Vegetables also provide the perfect balance of protein. Processed food has little nutritional value, so try to avoid this.  Medications  Resume taking all of your medications. If your incision is causing pain, you may take over-the counter pain relievers such as acetaminophen (Tylenol). If you were prescribed a stronger pain medication, please be aware these medications can cause nausea and constipation. Prevent  nausea by taking the medication with a snack or meal. Avoid constipation by drinking plenty of fluids and eating foods with high amount of fiber, such as fruits, vegetables, and grains.  Do not take Tylenol if you are taking prescription pain medications.  Follow up Your surgeon may want to see you in the office following your access surgery. If so, this will be arranged at the time of your surgery.  Please call us immediately for any of the following conditions:  Increased pain, redness, drainage (pus) from your incision site Fever of 101 degrees or higher Severe or worsening pain at your incision site Hand pain or numbness.  Reduce your risk of vascular disease:  Stop smoking. If you would like help, call [facility] at 1-800-QUIT-NOW (714) 727-3371) or [place] [facility] [place] your cholesterol Maintain a desired weight Control your diabetes Keep your blood pressure down  Dialysis  It will take several weeks to several months for your new dialysis access to be ready for use. Your surgeon will determine when it is okay to use it. Your nephrologist will continue to direct your dialysis. You can continue to use your Permcath until your new access is ready for use.   07/10/2022 [name] [name] [id number] 05-31-1956  Surgeon(s): Waynetta Sandy, MD  Procedure(s): RIGHT ARM ARTERIOVENOUS FISTULA SUPERFICIALIZATION  x Do not stick fistula for 8 weeks    If you have any questions, please call the office at [phone].\

## 2022-07-10 NOTE — Transfer of Care (Signed)
Immediate Anesthesia Transfer of Care Note  Patient: [name] [name]  Procedure(s) Performed: RIGHT ARM ARTERIOVENOUS FISTULA SUPERFICIALIZATION (Right: Arm Upper)  Patient Location: PACU  Anesthesia Type:MAC  Level of Consciousness: drowsy and patient cooperative  Airway & Oxygen Therapy: Patient Spontanous Breathing and Patient connected to nasal cannula oxygen  Post-op Assessment: Report given to RN and Post -op Vital signs reviewed and stable  Post vital signs: Reviewed and stable  Last Vitals:  Vitals Value Taken Time  BP 120/46 07/10/22 1328  Temp    Pulse 61 07/10/22 1330  Resp 20 07/10/22 1330  SpO2 94 % 07/10/22 1330  Vitals shown include unvalidated device data.  Last Pain:  Vitals:   07/10/22 0817  TempSrc:   PainSc: 0-No pain         Complications: No notable events documented.

## 2022-07-10 NOTE — Anesthesia Procedure Notes (Signed)
Procedure Name: MAC Date/Time: 07/10/2022 12:09 PM  Performed by: Janene Harvey, CRNAPre-anesthesia Checklist: Patient identified, Emergency Drugs available, Suction available and Patient being monitored Patient Re-evaluated:Patient Re-evaluated prior to induction Oxygen Delivery Method: Simple face mask Induction Type: IV induction Placement Confirmation: positive ETCO2 Dental Injury: Teeth and Oropharynx as per pre-operative assessment

## 2022-07-10 NOTE — Op Note (Signed)
    Patient name: [name] [name] MRN: [id number] DOB: Jul 08, 1955 Sex: male  07/10/2022 Pre-operative Diagnosis: End-stage renal disease Post-operative diagnosis:  Same Surgeon:  Erlene Quan C. Donzetta Matters, MD Assistant: Leontine Locket, PA Procedure Performed: Revision of right arm AV fistula with superficialization  Indications: 67 year old male with a history of end-stage renal disease currently dialyzed via catheter.  He has undergone a right arm brachial artery to cephalic vein AV fistula creation and is now indicated for revision with transposition versus superficialization.  Findings: Fistula measure greater than 5 mm throughout its course and was superficial lysed to just below the skin and at completion there was a very strong thrill palpable throughout the right upper arm.   Procedure:  The patient was identified in the holding area and taken to the operating room where he was placed supine operative table and MAC anesthesia was induced.  He was sterilely prepped and draped in the right upper extremity usual fashion, antibiotics were administered timeout was called.  Ultrasound was used to visualize the fistula throughout the right upper arm and we then traced to plan incisions overlying the fistula.  The entirety of this area was anesthetized with 1% lidocaine with epinephrine.  The 2 incisions were then created.  The fistula was mobilized throughout the upper arm dividing it from the deep fascia and branches were divided between clips and ties.  There were 2 areas that were repaired on the fistula side with Prolene suture.  After the fistula was totally mobilized we then closed some of the subcutaneous tissue with interrupted Vicryl suture and then closed the skin overlying the fistula with Monocryl.  Dermabond is placed at the skin site.  The patient was then awakened from anesthesia having tolerated procedure well without immediate complication.  There was a very strong thrill in the fistula at  completion.  All counts were correct at completion.  An experienced assistant was necessary to facilitate exposure of the fistula and to close the skin just over the top of the fistula maintaining a thrill.  EBL: 50 cc    Lakota Markgraf C. Donzetta Matters, MD [facility] Office: [phone] Pager: [phone]

## 2022-07-10 NOTE — Anesthesia Postprocedure Evaluation (Signed)
Anesthesia Post Note  Patient: Kristofer Schaffert  Procedure(s) Performed: RIGHT ARM ARTERIOVENOUS FISTULA SUPERFICIALIZATION (Right: Arm Upper)     Patient location during evaluation: PACU Anesthesia Type: MAC Level of consciousness: awake and alert and oriented Pain management: pain level controlled Vital Signs Assessment: post-procedure vital signs reviewed and stable Respiratory status: spontaneous breathing, nonlabored ventilation and respiratory function stable Cardiovascular status: stable and blood pressure returned to baseline Postop Assessment: no apparent nausea or vomiting Anesthetic complications: no   No notable events documented.  Last Vitals:  Vitals:   07/10/22 1330 07/10/22 1345  BP: (!) 119/45 (!) 133/54  Pulse: 61 60  Resp: 20 20  Temp:  36.7 C  SpO2: 94% 91%    Last Pain:  Vitals:   07/10/22 1345  TempSrc:   PainSc: 0-No pain                 Delara Shepheard A.

## 2022-07-11 ENCOUNTER — Encounter (HOSPITAL_COMMUNITY): Payer: Self-pay | Admitting: Vascular Surgery

## 2022-07-11 DIAGNOSIS — Z992 Dependence on renal dialysis: Secondary | ICD-10-CM | POA: Diagnosis not present

## 2022-07-11 DIAGNOSIS — N186 End stage renal disease: Secondary | ICD-10-CM | POA: Diagnosis not present

## 2022-07-11 DIAGNOSIS — N2581 Secondary hyperparathyroidism of renal origin: Secondary | ICD-10-CM | POA: Diagnosis not present

## 2022-07-14 DIAGNOSIS — Z992 Dependence on renal dialysis: Secondary | ICD-10-CM | POA: Diagnosis not present

## 2022-07-14 DIAGNOSIS — N2581 Secondary hyperparathyroidism of renal origin: Secondary | ICD-10-CM | POA: Diagnosis not present

## 2022-07-14 DIAGNOSIS — N186 End stage renal disease: Secondary | ICD-10-CM | POA: Diagnosis not present

## 2022-07-18 DIAGNOSIS — N186 End stage renal disease: Secondary | ICD-10-CM | POA: Diagnosis not present

## 2022-07-18 DIAGNOSIS — N2581 Secondary hyperparathyroidism of renal origin: Secondary | ICD-10-CM | POA: Diagnosis not present

## 2022-07-18 DIAGNOSIS — Z992 Dependence on renal dialysis: Secondary | ICD-10-CM | POA: Diagnosis not present

## 2022-07-21 DIAGNOSIS — Z992 Dependence on renal dialysis: Secondary | ICD-10-CM | POA: Diagnosis not present

## 2022-07-21 DIAGNOSIS — N2581 Secondary hyperparathyroidism of renal origin: Secondary | ICD-10-CM | POA: Diagnosis not present

## 2022-07-21 DIAGNOSIS — N186 End stage renal disease: Secondary | ICD-10-CM | POA: Diagnosis not present

## 2022-07-23 DIAGNOSIS — N179 Acute kidney failure, unspecified: Secondary | ICD-10-CM | POA: Diagnosis not present

## 2022-07-23 DIAGNOSIS — Z992 Dependence on renal dialysis: Secondary | ICD-10-CM | POA: Diagnosis not present

## 2022-07-23 DIAGNOSIS — N186 End stage renal disease: Secondary | ICD-10-CM | POA: Diagnosis not present

## 2022-07-25 DIAGNOSIS — Z992 Dependence on renal dialysis: Secondary | ICD-10-CM | POA: Diagnosis not present

## 2022-07-25 DIAGNOSIS — N186 End stage renal disease: Secondary | ICD-10-CM | POA: Diagnosis not present

## 2022-07-25 DIAGNOSIS — N2581 Secondary hyperparathyroidism of renal origin: Secondary | ICD-10-CM | POA: Diagnosis not present

## 2022-07-28 DIAGNOSIS — Z992 Dependence on renal dialysis: Secondary | ICD-10-CM | POA: Diagnosis not present

## 2022-07-28 DIAGNOSIS — N2581 Secondary hyperparathyroidism of renal origin: Secondary | ICD-10-CM | POA: Diagnosis not present

## 2022-07-28 DIAGNOSIS — N186 End stage renal disease: Secondary | ICD-10-CM | POA: Diagnosis not present

## 2022-07-30 ENCOUNTER — Ambulatory Visit (INDEPENDENT_AMBULATORY_CARE_PROVIDER_SITE_OTHER): Payer: Medicare HMO | Admitting: Physician Assistant

## 2022-07-30 VITALS — BP 152/64 | HR 68 | Temp 98.5°F | Wt 259.0 lb

## 2022-07-30 DIAGNOSIS — N186 End stage renal disease: Secondary | ICD-10-CM

## 2022-07-30 NOTE — Progress Notes (Signed)
  POST OPERATIVE OFFICE NOTE    CC:  F/u for surgery  HPI:  This is a 67 y.o. male who is s/p right brachiocephalic fistula created by Dr. Donnetta Hutching on 05/20/2022. Followed by superficialization of the cephalic vein due to depth> 0.6 cm.   on 07/10/21 by Dr. Donzetta Matters.    Pt returns today for follow up.  Pt states he has occasional numbness, but no loss of motor or sensation.  He is currently on HD via right TDC placed by [facility].     No Known Allergies  Current Outpatient Medications  Medication Sig Dispense Refill   albuterol (VENTOLIN HFA) 108 (90 Base) MCG/ACT inhaler Inhale 2 puffs into the lungs every 6 (six) hours as needed for wheezing or shortness of breath.     amiodarone (PACERONE) 200 MG tablet Take 1 tablet (200 mg total) by mouth 2 (two) times daily. 180 tablet 3   atorvastatin (LIPITOR) 80 MG tablet Take 1 tablet (80 mg total) by mouth daily. 90 tablet 3   B Complex-C-Folic Acid (DIALYVITE 073) 0.8 MG TABS Take 1 tablet by mouth daily.     Dulaglutide (TRULICITY) 3 XT/0.6YI SOPN Inject 3 mg into the skin every Wednesday.     ELIQUIS 5 MG TABS tablet TAKE 1 TABLET TWICE DAILY 180 tablet 1   fluticasone furoate-vilanterol (BREO ELLIPTA) 200-25 MCG/ACT AEPB Inhale 1 puff into the lungs daily.  3   insulin detemir (LEVEMIR) 100 UNIT/ML FlexPen Inject 30 Units into the skin 2 (two) times daily. (Patient taking differently: Inject 40 Units into the skin 2 (two) times daily.) 15 mL 11   Insulin Pen Needle 32G X 4 MM MISC Use to inject Levemir 2 (two) times daily. 100 each 1   metoprolol succinate (TOPROL-XL) 25 MG 24 hr tablet Take 25 mg by mouth daily.     midodrine (PROAMATINE) 5 MG tablet Take 1 tablet (5 mg total) by mouth 3 (three) times daily with meals. (Patient taking differently: Take 5 mg by mouth 2 (two) times daily with a meal.) 270 tablet 3   torsemide (DEMADEX) 20 MG tablet Take 1 tablet (20 mg total) by mouth daily. 90 tablet 1   traMADol (ULTRAM) 50 MG tablet Take 1  tablet (50 mg total) by mouth every 8 (eight) hours as needed. 15 tablet 0   No current facility-administered medications for this visit.     ROS:  See HPI  Physical Exam:    Incision:  well healed Extremities:  palpable thrill distally with pulsatile thrill proximally.  Doppler signal with good turbulence flow. Neuro: sensation intact     Assessment/Plan:  This is a 67 y.o. male who is s/p: right brachiocephalic fistula created by Dr. Donnetta Hutching on 05/20/2022. Followed by superficialization of the cephalic vein due to depth> 0.6 cm.   on 07/10/21 by Dr. Donzetta Matters.     The fistula may be accesed for HD by 08/11/22.  Once it is working well he can have the [facility] removed by [facility].  If he has problems or concerns he will call our office.  F/U PRN   Roxy Horseman PA-C [facility] [phone]   Clinic MD:  Donzetta Matters

## 2022-08-01 DIAGNOSIS — N2581 Secondary hyperparathyroidism of renal origin: Secondary | ICD-10-CM | POA: Diagnosis not present

## 2022-08-01 DIAGNOSIS — N186 End stage renal disease: Secondary | ICD-10-CM | POA: Diagnosis not present

## 2022-08-01 DIAGNOSIS — Z992 Dependence on renal dialysis: Secondary | ICD-10-CM | POA: Diagnosis not present

## 2022-08-04 DIAGNOSIS — Z992 Dependence on renal dialysis: Secondary | ICD-10-CM | POA: Diagnosis not present

## 2022-08-04 DIAGNOSIS — N186 End stage renal disease: Secondary | ICD-10-CM | POA: Diagnosis not present

## 2022-08-04 DIAGNOSIS — N2581 Secondary hyperparathyroidism of renal origin: Secondary | ICD-10-CM | POA: Diagnosis not present

## 2022-08-07 ENCOUNTER — Encounter: Payer: Self-pay | Admitting: Cardiothoracic Surgery

## 2022-08-08 DIAGNOSIS — N186 End stage renal disease: Secondary | ICD-10-CM | POA: Diagnosis not present

## 2022-08-08 DIAGNOSIS — N2581 Secondary hyperparathyroidism of renal origin: Secondary | ICD-10-CM | POA: Diagnosis not present

## 2022-08-08 DIAGNOSIS — Z992 Dependence on renal dialysis: Secondary | ICD-10-CM | POA: Diagnosis not present

## 2022-08-11 ENCOUNTER — Ambulatory Visit: Payer: Medicare HMO | Admitting: Cardiothoracic Surgery

## 2022-08-11 ENCOUNTER — Other Ambulatory Visit: Payer: Medicare HMO

## 2022-08-11 DIAGNOSIS — N2581 Secondary hyperparathyroidism of renal origin: Secondary | ICD-10-CM | POA: Diagnosis not present

## 2022-08-11 DIAGNOSIS — Z992 Dependence on renal dialysis: Secondary | ICD-10-CM | POA: Diagnosis not present

## 2022-08-11 DIAGNOSIS — N186 End stage renal disease: Secondary | ICD-10-CM | POA: Diagnosis not present

## 2022-08-12 ENCOUNTER — Other Ambulatory Visit: Payer: Medicare HMO

## 2022-08-15 DIAGNOSIS — N2581 Secondary hyperparathyroidism of renal origin: Secondary | ICD-10-CM | POA: Diagnosis not present

## 2022-08-15 DIAGNOSIS — N186 End stage renal disease: Secondary | ICD-10-CM | POA: Diagnosis not present

## 2022-08-15 DIAGNOSIS — Z992 Dependence on renal dialysis: Secondary | ICD-10-CM | POA: Diagnosis not present

## 2022-08-17 NOTE — Progress Notes (Unsigned)
Cardiology Office Note:    Date:  08/19/2022   ID:  Demetrias Tworek, DOB 1956/01/01, MRN [id number]  PCP:  Cyndi Bender, PA-C  Cardiologist:  Shirlee More, MD    Referring MD: Cyndi Bender, PA-C    ASSESSMENT:    1. Atypical atrial flutter ([place])   2. On amiodarone therapy   3. Chronic anticoagulation   4. Coronary artery disease of bypass graft of native heart with stable angina pectoris ([place])   5. Hypertensive heart disease with heart failure ([place])   6. ESRD (end stage renal disease) ([place])   7. Mixed hyperlipidemia    PLAN:    In order of problems listed above:  Will check labs with his next phlebotomy continue amiodarone continue his anticoagulant Stable CAD status post CABG on good medical therapy including lipid-lowering with statin Stable volume overload managed with hemodialysis he remains on midodrine but I decrease it to once daily and plan to discontinue next visit Continue maintenance hemodialysis Continue statin check lipid profile   Next appointment: 6 months   Medication Adjustments/Labs and Tests Ordered: Current medicines are reviewed at length with the patient today.  Concerns regarding medicines are outlined above.  No orders of the defined types were placed in this encounter.  No orders of the defined types were placed in this encounter.   Chief Complaint  Patient presents with   Follow-up   Coronary Artery Disease   Congestive Heart Failure    History of Present Illness:    Osamu Majewski is a 67 y.o. male with a hx of complex heart disease including atypical atrial flutter maintaining sinus rhythm on amiodarone chronic anticoagulation coronary artery disease with CABG now that more than 6 months remote from surgery on low-dose aspirin hypertensive heart disease with heart failure hypotension requiring midodrine therapy and chronic kidney disease with maintenance hemodialysis last seen 05/13/2022.  Compliance with diet, lifestyle and  medications: Yes  Overall he is doing better he is on maintenance hemodialysis 2 days a week which controls his volume overload his strength and endurance are better he is not having angina edema shortness of breath palpitation or syncope He tolerates his anticoagulant without bleeding and he is on amiodarone He is frequent labs done at dialysis and going to give him a note asking them to check TSH Free T3-T4 and a lipid profile with his next phlebotomy. He has had no perception he has recurrent atrial fibrillation He tolerates his statin without muscle pain or weakness Past Medical History:  Diagnosis Date   Anemia 04/17/2022   Blood transfusion   Atherosclerotic heart disease    Benign essential HTN 07/30/2014   Bradycardia 01/25/2015   CHF (congestive heart failure) (HCC)    Chronic anticoagulation [id number]   Chronic systolic CHF (congestive heart failure), NYHA class 3 ([place]) 08/30/2014   Overview:  Global ef 30%   CKD (chronic kidney disease)    Class 3 severe obesity in adult [facility]) 04/28/2017   Coronary artery disease    Diabetes mellitus without complication ([place])    Diabetic neuropathy ([place]) 07/28/2014   Dysrhythmia    Erectile dysfunction    High risk medication use 05/26/2018   Hypertensive heart disease with heart failure ([place]) 07/30/2014   Leukocytosis 11/26/2021   Lumbago    LV dysfunction 04/28/2017   Mixed hyperlipidemia 07/28/2014   OSA (obstructive sleep apnea)    CPAP   Paroxysmal atrial fibrillation ([place]) 07/28/2014   Postop check 01/06/2022   S/P CABG x 3 11/12/2021  Sinus node dysfunction ([place]) 04/21/2016   Testicular hypofunction    Uncontrolled type 2 diabetes mellitus with microalbuminuric diabetic nephropathy 07/28/2014    Past Surgical History:  Procedure Laterality Date   AV FISTULA PLACEMENT Right 05/20/2022   Procedure: RIGHT ARM ARTERIOVENOUS (AV) FISTULA CREATION;  Surgeon: Rosetta Posner, MD;  Location: [place];  Service: Vascular;   Laterality: Right;   BUBBLE STUDY  11/21/2021   Procedure: BUBBLE STUDY;  Surgeon: Larey Dresser, MD;  Location: [place];  Service: Cardiovascular;;   CARDIAC CATHETERIZATION     CARDIOVERSION N/A 11/21/2021   Procedure: CARDIOVERSION;  Surgeon: Larey Dresser, MD;  Location: [facility] ENDOSCOPY;  Service: Cardiovascular;  Laterality: N/A;   CARDIOVERSION N/A 11/25/2021   Procedure: CARDIOVERSION;  Surgeon: Larey Dresser, MD;  Location: [facility] ENDOSCOPY;  Service: Cardiovascular;  Laterality: N/A;   CARDIOVERSION N/A 11/29/2021   Procedure: CARDIOVERSION;  Surgeon: Larey Dresser, MD;  Location: [facility] END[facility]COPY;  Service: Cardiovascular;  Laterality: N/A;   CARDIOVERSION N/A 02/27/2022   Procedure: CARDIOVERSION;  Surgeon: Donato Heinz, MD;  Location: [facility] ENDOSCOPY;  Service: Cardiovascular;  Laterality: N/A;   CORONARY ARTERY BYPASS GRAFT N/A 11/12/2021   Procedure: CORONARY ARTERY BYPASS GRAFTING (CABG) x  3 USING LEFT INTERNAL MAMMARY ARTERY AND RIGHT GREATER SAPHENOUS VEIN;  Surgeon: Dahlia Byes, MD;  Location: [place];  Service: Open Heart Surgery;  Laterality: N/A;   ELECTROPHYSIOLOGIC STUDY N/A 01/18/2015   Procedure: CARDIOVERSION;  Surgeon: [name], MD;  Location: [facility]S;  Service: Cardiovascular;  Laterality: N/A;   ENDOVEIN HARVEST OF GREATER SAPHENOUS VEIN  11/12/2021   Procedure: ENDOVEIN HARVEST OF GREATER SAPHENOUS VEIN;  Surgeon: Dahlia Byes, MD;  Location: [place];  Service: Open Heart Surgery;;   EYE SURGERY Bilateral 2022   FISTULA SUPERFICIALIZATION Right 07/10/2022   Procedure: RIGHT ARM ARTERIOVENOUS FISTULA SUPERFICIALIZATION;  Surgeon: Waynetta Sandy, MD;  Location: [place];  Service: Vascular;  Laterality: Right;   IR FLUORO GUIDE CV LINE RIGHT  11/28/2021   IR US GUIDE VASC ACCESS RIGHT  11/28/2021   LEFT HEART CATH AND CORONARY ANGIOGRAPHY N/A 10/24/2021   Procedure: LEFT HEART CATH AND CORONARY ANGIOGRAPHY;  Surgeon: Jettie Booze, MD;  Location: [place] CV LAB;  Service: Cardiovascular;  Laterality: N/A;   MAZE N/A 11/12/2021   Procedure: MAZE;  Surgeon: Dahlia Byes, MD;  Location: [place];  Service: Open Heart Surgery;  Laterality: N/A;   TEE WITHOUT CARDIOVERSION N/A 11/12/2021   Procedure: TRANSESOPHAGEAL ECHOCARDIOGRAM (TEE);  Surgeon: Dahlia Byes, MD;  Location: [place];  Service: Open Heart Surgery;  Laterality: N/A;   TEE WITHOUT CARDIOVERSION N/A 11/21/2021   Procedure: TRANSESOPHAGEAL ECHOCARDIOGRAM (TEE);  Surgeon: Larey Dresser, MD;  Location: [facility] ENDOSCOPY;  Service: Cardiovascular;  Laterality: N/A;    Current Medications: Current Meds  Medication Sig   albuterol (VENTOLIN HFA) 108 (90 Base) MCG/ACT inhaler Inhale 2 puffs into the lungs every 6 (six) hours as needed for wheezing or shortness of breath.   amiodarone (PACERONE) 200 MG tablet Take 1 tablet (200 mg total) by mouth 2 (two) times daily.   atorvastatin (LIPITOR) 80 MG tablet Take 1 tablet (80 mg total) by mouth daily.   B Complex-C-Folic Acid (DIALYVITE Q000111Q) 0.8 MG TABS Take 1 tablet by mouth daily.   Dulaglutide (TRULICITY) 3 0000000 SOPN Inject 3 mg into the skin every Wednesday.   ELIQUIS 5 MG TABS tablet TAKE 1 TABLET TWICE DAILY   fluticasone furoate-vilanterol (BREO  ELLIPTA) 200-25 MCG/ACT AEPB Inhale 1 puff into the lungs daily.   insulin detemir (LEVEMIR) 100 UNIT/ML FlexPen Inject 30 Units into the skin 2 (two) times daily. (Patient taking differently: Inject 40 Units into the skin 2 (two) times daily.)   Insulin Pen Needle 32G X 4 MM MISC Use to inject Levemir 2 (two) times daily.   metoprolol succinate (TOPROL-XL) 25 MG 24 hr tablet Take 25 mg by mouth daily.   midodrine (PROAMATINE) 5 MG tablet Take 1 tablet (5 mg total) by mouth 3 (three) times daily with meals. (Patient taking differently: Take 5 mg by mouth 2 (two) times daily with a meal.)   torsemide (DEMADEX) 20 MG tablet Take 1 tablet (20 mg total) by  mouth daily.     Allergies:   Patient has no known allergies.   Social History   Socioeconomic History   Marital status: Married    Spouse name: Not on file   Number of children: Not on file   Years of education: Not on file   Highest education level: Not on file  Occupational History   Not on file  Tobacco Use   Smoking status: Former    Types: Cigarettes    Quit date: 2007    Years since quitting: 17.1   Smokeless tobacco: Never  Vaping Use   Vaping Use: Never used  Substance and Sexual Activity   Alcohol use: No   Drug use: Not Currently    Frequency: 1.0 times per week    Types: Marijuana   Sexual activity: Not on file  Other Topics Concern   Not on file  Social History Narrative   Not on file   Social Determinants of Health   Financial Resource Strain: Not on file  Food Insecurity: Not on file  Transportation Needs: Not on file  Physical Activity: Not on file  Stress: Not on file  Social Connections: Not on file     Family History: The patient's family history includes CAD in his brother and mother; Drug abuse in his brother; Lung cancer in his father. ROS:   Please see the history of present illness.    All other systems reviewed and are negative.  EKGs/Labs/Other Studies Reviewed:    The following studies were reviewed today:  EKG:  EKG ordered today and personally reviewed.  The ekg ordered today demonstrates sinus rhythm right bundle branch block left axis deviation left anterior hemiblock   Most recent labs 10/31/2021 LDL 42 cholesterol 129 07/10/2022 hemoglobin 10.9 creatinine 4.7  Physical Exam:    VS:  BP (!) 162/70 (BP Location: Left Arm, Patient Position: Sitting)   Pulse 62   Ht '5\' 10"'$  (1.778 m)   Wt 255 lb (115.7 kg)   SpO2 97%   BMI 36.59 kg/m     Wt Readings from Last 3 Encounters:  08/19/22 255 lb (115.7 kg)  07/30/22 259 lb (117.5 kg)  07/10/22 256 lb (116.1 kg)     GEN:  Well nourished, well developed in no acute  distress HEENT: Normal NECK: No JVD; No carotid bruits LYMPHATICS: No lymphadenopathy CARDIAC: RRR, no murmurs, rubs, gallops RESPIRATORY:  Clear to auscultation without rales, wheezing or rhonchi  ABDOMEN: Soft, non-tender, non-distended MUSCULOSKELETAL:  No edema; No deformity  SKIN: Warm and dry NEUROLOGIC:  Alert and oriented x 3 PSYCHIATRIC:  Normal affect    Signed, Shirlee More, MD  08/19/2022 2:21 PM    [facility]

## 2022-08-18 DIAGNOSIS — N186 End stage renal disease: Secondary | ICD-10-CM | POA: Diagnosis not present

## 2022-08-18 DIAGNOSIS — Z992 Dependence on renal dialysis: Secondary | ICD-10-CM | POA: Diagnosis not present

## 2022-08-18 DIAGNOSIS — N2581 Secondary hyperparathyroidism of renal origin: Secondary | ICD-10-CM | POA: Diagnosis not present

## 2022-08-19 ENCOUNTER — Encounter: Payer: Self-pay | Admitting: Cardiology

## 2022-08-19 ENCOUNTER — Ambulatory Visit: Payer: Medicare HMO | Attending: Cardiology | Admitting: Cardiology

## 2022-08-19 VITALS — BP 162/70 | HR 62 | Ht 70.0 in | Wt 255.0 lb

## 2022-08-19 DIAGNOSIS — Z79899 Other long term (current) drug therapy: Secondary | ICD-10-CM

## 2022-08-19 DIAGNOSIS — I110 Hypertensive heart disease with heart failure: Secondary | ICD-10-CM

## 2022-08-19 DIAGNOSIS — I25708 Atherosclerosis of coronary artery bypass graft(s), unspecified, with other forms of angina pectoris: Secondary | ICD-10-CM

## 2022-08-19 DIAGNOSIS — N186 End stage renal disease: Secondary | ICD-10-CM | POA: Diagnosis not present

## 2022-08-19 DIAGNOSIS — Z7901 Long term (current) use of anticoagulants: Secondary | ICD-10-CM | POA: Diagnosis not present

## 2022-08-19 DIAGNOSIS — I484 Atypical atrial flutter: Secondary | ICD-10-CM | POA: Diagnosis not present

## 2022-08-19 DIAGNOSIS — E782 Mixed hyperlipidemia: Secondary | ICD-10-CM | POA: Diagnosis not present

## 2022-08-19 MED ORDER — MIDODRINE HCL 5 MG PO TABS: 5.0000 mg | ORAL_TABLET | Freq: Every day | ORAL | 3 refills | Status: DC

## 2022-08-19 NOTE — Patient Instructions (Addendum)
Medication Instructions:  Your physician has recommended you make the following change in your medication:   START: Midodrine 5 mg daily  *If you need a refill on your cardiac medications before your next appointment, please call your pharmacy*   Lab Work: None If you have labs (blood work) drawn today and your tests are completely normal, you will receive your results only by: [place] (if you have MyChart) OR A paper copy in the mail If you have any lab test that is abnormal or we need to change your treatment, we will call you to review the results.   Testing/Procedures: None   Follow-Up: At [facility], you and your health needs are our priority.  As part of our continuing mission to provide you with exceptional heart care, we have created designated Provider Care Teams.  These Care Teams include your primary Cardiologist (physician) and Advanced Practice Providers (APPs -  Physician Assistants and Nurse Practitioners) who all work together to provide you with the care you need, when you need it.  We recommend signing up for the patient portal called "MyChart".  Sign up information is provided on this After Visit Summary.  MyChart is used to connect with patients for Virtual Visits (Telemedicine).  Patients are able to view lab/test results, encounter notes, upcoming appointments, etc.  Non-urgent messages can be sent to your provider as well.   To learn more about what you can do with MyChart, go to [email or web address].    Your next appointment:   6 month(s)  Provider:   Shirlee More, MD    Other Instructions Please do a TSH, T4 and free T3 and lipids with the next Hemodialysis labs  Contrast oral for CT

## 2022-08-19 NOTE — Addendum Note (Signed)
Addended by: Edwyna Shell I on: 08/19/2022 02:46 PM   Modules accepted: Orders

## 2022-08-21 DIAGNOSIS — Z992 Dependence on renal dialysis: Secondary | ICD-10-CM | POA: Diagnosis not present

## 2022-08-21 DIAGNOSIS — E782 Mixed hyperlipidemia: Secondary | ICD-10-CM | POA: Diagnosis not present

## 2022-08-21 DIAGNOSIS — N186 End stage renal disease: Secondary | ICD-10-CM | POA: Diagnosis not present

## 2022-08-21 DIAGNOSIS — I10 Essential (primary) hypertension: Secondary | ICD-10-CM | POA: Diagnosis not present

## 2022-08-21 DIAGNOSIS — E1140 Type 2 diabetes mellitus with diabetic neuropathy, unspecified: Secondary | ICD-10-CM | POA: Diagnosis not present

## 2022-08-21 DIAGNOSIS — N179 Acute kidney failure, unspecified: Secondary | ICD-10-CM | POA: Diagnosis not present

## 2022-08-22 DIAGNOSIS — Z992 Dependence on renal dialysis: Secondary | ICD-10-CM | POA: Diagnosis not present

## 2022-08-22 DIAGNOSIS — N2581 Secondary hyperparathyroidism of renal origin: Secondary | ICD-10-CM | POA: Diagnosis not present

## 2022-08-22 DIAGNOSIS — N186 End stage renal disease: Secondary | ICD-10-CM | POA: Diagnosis not present

## 2022-08-25 DIAGNOSIS — Z992 Dependence on renal dialysis: Secondary | ICD-10-CM | POA: Diagnosis not present

## 2022-08-25 DIAGNOSIS — N2581 Secondary hyperparathyroidism of renal origin: Secondary | ICD-10-CM | POA: Diagnosis not present

## 2022-08-25 DIAGNOSIS — N186 End stage renal disease: Secondary | ICD-10-CM | POA: Diagnosis not present

## 2022-08-29 DIAGNOSIS — N186 End stage renal disease: Secondary | ICD-10-CM | POA: Diagnosis not present

## 2022-08-29 DIAGNOSIS — Z992 Dependence on renal dialysis: Secondary | ICD-10-CM | POA: Diagnosis not present

## 2022-08-29 DIAGNOSIS — N2581 Secondary hyperparathyroidism of renal origin: Secondary | ICD-10-CM | POA: Diagnosis not present

## 2022-09-01 DIAGNOSIS — N186 End stage renal disease: Secondary | ICD-10-CM | POA: Diagnosis not present

## 2022-09-01 DIAGNOSIS — Z992 Dependence on renal dialysis: Secondary | ICD-10-CM | POA: Diagnosis not present

## 2022-09-01 DIAGNOSIS — N2581 Secondary hyperparathyroidism of renal origin: Secondary | ICD-10-CM | POA: Diagnosis not present

## 2022-09-03 ENCOUNTER — Other Ambulatory Visit: Payer: Self-pay | Admitting: Cardiology

## 2022-09-04 DIAGNOSIS — N186 End stage renal disease: Secondary | ICD-10-CM | POA: Diagnosis not present

## 2022-09-04 DIAGNOSIS — Z452 Encounter for adjustment and management of vascular access device: Secondary | ICD-10-CM | POA: Diagnosis not present

## 2022-09-04 DIAGNOSIS — Z992 Dependence on renal dialysis: Secondary | ICD-10-CM | POA: Diagnosis not present

## 2022-09-05 DIAGNOSIS — N2581 Secondary hyperparathyroidism of renal origin: Secondary | ICD-10-CM | POA: Diagnosis not present

## 2022-09-05 DIAGNOSIS — N186 End stage renal disease: Secondary | ICD-10-CM | POA: Diagnosis not present

## 2022-09-05 DIAGNOSIS — Z992 Dependence on renal dialysis: Secondary | ICD-10-CM | POA: Diagnosis not present

## 2022-09-08 DIAGNOSIS — N2581 Secondary hyperparathyroidism of renal origin: Secondary | ICD-10-CM | POA: Diagnosis not present

## 2022-09-08 DIAGNOSIS — N186 End stage renal disease: Secondary | ICD-10-CM | POA: Diagnosis not present

## 2022-09-08 DIAGNOSIS — Z992 Dependence on renal dialysis: Secondary | ICD-10-CM | POA: Diagnosis not present

## 2022-09-10 ENCOUNTER — Ambulatory Visit
Admission: RE | Admit: 2022-09-10 | Discharge: 2022-09-10 | Disposition: A | Discharge status: Home / Self Care | Payer: Medicare HMO | Source: Ambulatory Visit | Attending: Cardiothoracic Surgery | Admitting: Cardiothoracic Surgery

## 2022-09-10 DIAGNOSIS — K769 Liver disease, unspecified: Secondary | ICD-10-CM | POA: Diagnosis not present

## 2022-09-10 DIAGNOSIS — N200 Calculus of kidney: Secondary | ICD-10-CM | POA: Diagnosis not present

## 2022-09-10 DIAGNOSIS — S301XXA Contusion of abdominal wall, initial encounter: Secondary | ICD-10-CM

## 2022-09-10 DIAGNOSIS — I700 Atherosclerosis of aorta: Secondary | ICD-10-CM | POA: Diagnosis not present

## 2022-09-11 DIAGNOSIS — I4891 Unspecified atrial fibrillation: Secondary | ICD-10-CM | POA: Diagnosis not present

## 2022-09-11 DIAGNOSIS — E1140 Type 2 diabetes mellitus with diabetic neuropathy, unspecified: Secondary | ICD-10-CM | POA: Diagnosis not present

## 2022-09-11 DIAGNOSIS — N186 End stage renal disease: Secondary | ICD-10-CM | POA: Diagnosis not present

## 2022-09-11 DIAGNOSIS — I2510 Atherosclerotic heart disease of native coronary artery without angina pectoris: Secondary | ICD-10-CM | POA: Diagnosis not present

## 2022-09-11 DIAGNOSIS — Z992 Dependence on renal dialysis: Secondary | ICD-10-CM | POA: Diagnosis not present

## 2022-09-11 DIAGNOSIS — I5020 Unspecified systolic (congestive) heart failure: Secondary | ICD-10-CM | POA: Diagnosis not present

## 2022-09-11 DIAGNOSIS — E782 Mixed hyperlipidemia: Secondary | ICD-10-CM | POA: Diagnosis not present

## 2022-09-12 DIAGNOSIS — Z992 Dependence on renal dialysis: Secondary | ICD-10-CM | POA: Diagnosis not present

## 2022-09-12 DIAGNOSIS — N2581 Secondary hyperparathyroidism of renal origin: Secondary | ICD-10-CM | POA: Diagnosis not present

## 2022-09-12 DIAGNOSIS — N186 End stage renal disease: Secondary | ICD-10-CM | POA: Diagnosis not present

## 2022-09-15 ENCOUNTER — Ambulatory Visit (INDEPENDENT_AMBULATORY_CARE_PROVIDER_SITE_OTHER): Payer: Medicare HMO | Admitting: Cardiothoracic Surgery

## 2022-09-15 ENCOUNTER — Encounter: Payer: Self-pay | Admitting: Cardiothoracic Surgery

## 2022-09-15 VITALS — BP 150/72 | HR 61 | Resp 20 | Ht 70.0 in | Wt 262.0 lb

## 2022-09-15 DIAGNOSIS — M5442 Lumbago with sciatica, left side: Secondary | ICD-10-CM

## 2022-09-15 DIAGNOSIS — G8929 Other chronic pain: Secondary | ICD-10-CM | POA: Diagnosis not present

## 2022-09-15 MED ORDER — GABAPENTIN 300 MG PO CAPS: 300.0000 mg | ORAL_CAPSULE | Freq: Two times a day (BID) | ORAL | 0 refills | Status: DC

## 2022-09-15 NOTE — Progress Notes (Signed)
HPI: The patient returns for scheduled follow-up with CT scan of the abdomen to evaluate a previous symptomatic left-sided psoas hematoma.  The patient continues to have neuropathic pain radiating from his low lower back to his left thigh.  This most commonly occurs at night.  CT scan of abdomen images personally reviewed.  The previous left psoas hematoma has resolved.  The patient does have some degenerative changes of his lower spine which is probably responsible for his symptoms.  Patient denies any cardiac symptoms and is followed by his cardiologist Dr. Shirlee More in [place]. His amiodarone dose is being tapered down.  The patient remains on intermittent hemodialysis and has an AV fistula in his right upper arm.  The patient will be given a prescription for low-dose gabapentin to help relieve his symptoms.  Current Outpatient Medications  Medication Sig Dispense Refill   albuterol (VENTOLIN HFA) 108 (90 Base) MCG/ACT inhaler Inhale 2 puffs into the lungs every 6 (six) hours as needed for wheezing or shortness of breath.     amiodarone (PACERONE) 200 MG tablet Take 1 tablet (200 mg total) by mouth 2 (two) times daily. 180 tablet 3   atorvastatin (LIPITOR) 80 MG tablet Take 1 tablet (80 mg total) by mouth daily. 90 tablet 3   B Complex-C-Folic Acid (DIALYVITE Q000111Q) 0.8 MG TABS Take 1 tablet by mouth daily.     Dulaglutide (TRULICITY) 3 0000000 SOPN Inject 3 mg into the skin every Wednesday.     ELIQUIS 5 MG TABS tablet TAKE 1 TABLET TWICE DAILY 180 tablet 1   fluticasone furoate-vilanterol (BREO ELLIPTA) 200-25 MCG/ACT AEPB Inhale 1 puff into the lungs daily.  3   Insulin Pen Needle 32G X 4 MM MISC Use to inject Levemir 2 (two) times daily. 100 each 1   LEVEMIR FLEXPEN 100 UNIT/ML FlexPen INJECT 30 UNITS UNDER THE SKIN TWICE DAILY 60 mL 3   metoprolol succinate (TOPROL-XL) 25 MG 24 hr tablet Take 25 mg by mouth daily.     midodrine (PROAMATINE) 5 MG tablet Take 1 tablet (5 mg total) by  mouth daily. 90 tablet 3   torsemide (DEMADEX) 20 MG tablet Take 1 tablet (20 mg total) by mouth daily. 90 tablet 1   traMADol (ULTRAM) 50 MG tablet Take 1 tablet (50 mg total) by mouth every 8 (eight) hours as needed. (Patient not taking: Reported on 08/19/2022) 15 tablet 0   No current facility-administered medications for this visit.     Physical Exam: Pulse 61, resp. rate 20, height 5\' 10"  (1.778 m), weight 262 lb (118.8 kg), SpO2 98 %.        Exam    General- alert and comfortable    Palpable thrill in the left antecubital fossa    Neck- no JVD, no cervical adenopathy palpable, no carotid bruit   Lungs- clear without rales, wheezes.  Sternal incision well-healed.   Cor- regular rate and rhythm, no murmur , gallop   Abdomen-obese, soft, non-tender   Extremities - warm, non-tender, minimal edema   Neuro- oriented, appropriate, no focal weakness   Diagnostic Tests: CT scan images performed today personally reviewed and results discussed with patient showing resolution of prior spontaneous left psoas hematoma which occurred perioperatively when he had his CABG May 2023.  Impression: Continued neuropathic lower back pain rating to the left thigh with resolution of previous left psoas hematoma. Symptoms probably related to degenerative arthritis of his spine. Plan: Will try trial of low-dose gabapentin for his symptoms. He will  return here as necessary for any cardiac surgical issues.   Dahlia Byes, MD [facility] [phone]

## 2022-09-19 DIAGNOSIS — N2581 Secondary hyperparathyroidism of renal origin: Secondary | ICD-10-CM | POA: Diagnosis not present

## 2022-09-19 DIAGNOSIS — Z992 Dependence on renal dialysis: Secondary | ICD-10-CM | POA: Diagnosis not present

## 2022-09-19 DIAGNOSIS — N186 End stage renal disease: Secondary | ICD-10-CM | POA: Diagnosis not present

## 2022-09-21 DIAGNOSIS — Z992 Dependence on renal dialysis: Secondary | ICD-10-CM | POA: Diagnosis not present

## 2022-09-21 DIAGNOSIS — N186 End stage renal disease: Secondary | ICD-10-CM | POA: Diagnosis not present

## 2022-09-21 DIAGNOSIS — N179 Acute kidney failure, unspecified: Secondary | ICD-10-CM | POA: Diagnosis not present

## 2022-09-22 ENCOUNTER — Ambulatory Visit: Payer: Medicare HMO | Admitting: Cardiology

## 2022-09-22 DIAGNOSIS — N186 End stage renal disease: Secondary | ICD-10-CM | POA: Diagnosis not present

## 2022-09-22 DIAGNOSIS — Z992 Dependence on renal dialysis: Secondary | ICD-10-CM | POA: Diagnosis not present

## 2022-09-22 DIAGNOSIS — N2581 Secondary hyperparathyroidism of renal origin: Secondary | ICD-10-CM | POA: Diagnosis not present

## 2022-09-26 DIAGNOSIS — N2581 Secondary hyperparathyroidism of renal origin: Secondary | ICD-10-CM | POA: Diagnosis not present

## 2022-09-26 DIAGNOSIS — N186 End stage renal disease: Secondary | ICD-10-CM | POA: Diagnosis not present

## 2022-09-26 DIAGNOSIS — Z992 Dependence on renal dialysis: Secondary | ICD-10-CM | POA: Diagnosis not present

## 2022-09-29 DIAGNOSIS — Z992 Dependence on renal dialysis: Secondary | ICD-10-CM | POA: Diagnosis not present

## 2022-09-29 DIAGNOSIS — N2581 Secondary hyperparathyroidism of renal origin: Secondary | ICD-10-CM | POA: Diagnosis not present

## 2022-09-29 DIAGNOSIS — N186 End stage renal disease: Secondary | ICD-10-CM | POA: Diagnosis not present

## 2022-10-03 DIAGNOSIS — N2581 Secondary hyperparathyroidism of renal origin: Secondary | ICD-10-CM | POA: Diagnosis not present

## 2022-10-03 DIAGNOSIS — N186 End stage renal disease: Secondary | ICD-10-CM | POA: Diagnosis not present

## 2022-10-03 DIAGNOSIS — Z992 Dependence on renal dialysis: Secondary | ICD-10-CM | POA: Diagnosis not present

## 2022-10-06 DIAGNOSIS — N2581 Secondary hyperparathyroidism of renal origin: Secondary | ICD-10-CM | POA: Diagnosis not present

## 2022-10-06 DIAGNOSIS — Z992 Dependence on renal dialysis: Secondary | ICD-10-CM | POA: Diagnosis not present

## 2022-10-06 DIAGNOSIS — N186 End stage renal disease: Secondary | ICD-10-CM | POA: Diagnosis not present

## 2022-10-08 DIAGNOSIS — E103293 Type 1 diabetes mellitus with mild nonproliferative diabetic retinopathy without macular edema, bilateral: Secondary | ICD-10-CM | POA: Diagnosis not present

## 2022-10-08 DIAGNOSIS — H26493 Other secondary cataract, bilateral: Secondary | ICD-10-CM | POA: Diagnosis not present

## 2022-10-08 DIAGNOSIS — Z7984 Long term (current) use of oral hypoglycemic drugs: Secondary | ICD-10-CM | POA: Diagnosis not present

## 2022-10-10 DIAGNOSIS — N186 End stage renal disease: Secondary | ICD-10-CM | POA: Diagnosis not present

## 2022-10-10 DIAGNOSIS — N2581 Secondary hyperparathyroidism of renal origin: Secondary | ICD-10-CM | POA: Diagnosis not present

## 2022-10-10 DIAGNOSIS — Z992 Dependence on renal dialysis: Secondary | ICD-10-CM | POA: Diagnosis not present

## 2022-10-13 DIAGNOSIS — N186 End stage renal disease: Secondary | ICD-10-CM | POA: Diagnosis not present

## 2022-10-13 DIAGNOSIS — N2581 Secondary hyperparathyroidism of renal origin: Secondary | ICD-10-CM | POA: Diagnosis not present

## 2022-10-13 DIAGNOSIS — Z992 Dependence on renal dialysis: Secondary | ICD-10-CM | POA: Diagnosis not present

## 2022-10-17 ENCOUNTER — Telehealth: Payer: Self-pay

## 2022-10-17 DIAGNOSIS — N2581 Secondary hyperparathyroidism of renal origin: Secondary | ICD-10-CM | POA: Diagnosis not present

## 2022-10-17 DIAGNOSIS — N186 End stage renal disease: Secondary | ICD-10-CM | POA: Diagnosis not present

## 2022-10-17 DIAGNOSIS — Z992 Dependence on renal dialysis: Secondary | ICD-10-CM | POA: Diagnosis not present

## 2022-10-17 NOTE — Patient Outreach (Signed)
  Care Coordination   Initial Visit Note   10/17/2022 Name: [name] [name] MRN: [id number] DOB: [birth year]  [name] [name] is a 67 y.o. year old male who sees [name], [place] for primary care. I spoke with  Laury Deep by phone today.  What matters to the patients health and wellness today?  Placed call to patient today to review and offer [facility] care coordination program. Patient reports that he is doing well and denies any current needs.     SDOH assessments and interventions completed:  No     Care Coordination Interventions:  No, not indicated   Follow up plan: No further intervention required.   Encounter Outcome:  Pt. Refused   Rowe Pavy, RN, BSN, CEN [facility] [employer] [phone]

## 2022-10-20 DIAGNOSIS — N2581 Secondary hyperparathyroidism of renal origin: Secondary | ICD-10-CM | POA: Diagnosis not present

## 2022-10-20 DIAGNOSIS — Z992 Dependence on renal dialysis: Secondary | ICD-10-CM | POA: Diagnosis not present

## 2022-10-20 DIAGNOSIS — N186 End stage renal disease: Secondary | ICD-10-CM | POA: Diagnosis not present

## 2022-10-21 ENCOUNTER — Ambulatory Visit: Payer: Medicare HMO | Attending: Cardiology | Admitting: Cardiology

## 2022-10-21 DIAGNOSIS — N186 End stage renal disease: Secondary | ICD-10-CM | POA: Diagnosis not present

## 2022-10-21 DIAGNOSIS — Z992 Dependence on renal dialysis: Secondary | ICD-10-CM | POA: Diagnosis not present

## 2022-10-21 DIAGNOSIS — N179 Acute kidney failure, unspecified: Secondary | ICD-10-CM | POA: Diagnosis not present

## 2022-10-21 NOTE — Progress Notes (Deleted)
Electrophysiology Office Note   Date:  10/21/2022   ID:  [name] [name] [birth year], MRN [id number]  PCP:  [name], PA-C  Cardiologist:  Dulce Sellar Primary Electrophysiologist:  Ahkeem Goede Jorja Loa, MD    Chief Complaint: AF   History of Present Illness: [name] [name] is a 67 y.o. male who is being seen today for the evaluation of AF at the request of [name], [place]. Presenting today for electrophysiology evaluation.  He has a history significant atrial fibrillation, cardiomyopathy with normalization of his ejection fraction, CKD on dialysis, sick sinus syndrome.  He was found to be in atrial fibrillation in 2022.  He was initially scheduled for ablation but preablation TEE showed an elevated calcium score.  He is now post CABG x 3 and surgical maze.  Postop from CABG, he had acute renal failure and is now on dialysis.  He is on amiodarone for his atrial fibrillation.  Today, denies symptoms of palpitations, chest pain, shortness of breath, orthopnea, PND, lower extremity edema, claudication, dizziness, presyncope, syncope, bleeding, or neurologic sequela. The patient is tolerating medications without difficulties. ***     Past Medical History:  Diagnosis Date   Anemia 04/17/2022   Blood transfusion   Atherosclerotic heart disease    Benign essential HTN 07/30/2014   Bradycardia 01/25/2015   CHF (congestive heart failure) (HCC)    Chronic anticoagulation 02/25/2018   Chronic systolic CHF (congestive heart failure), NYHA class 3 (HCC) 08/30/2014   Overview:  Global ef 30%   CKD (chronic kidney disease)    Class 3 severe obesity in adult [facility]) 04/28/2017   Coronary artery disease    Diabetes mellitus without complication (HCC)    Diabetic neuropathy (HCC) 07/28/2014   Dysrhythmia    Erectile dysfunction    High risk medication use 05/26/2018   Hypertensive heart disease with heart failure (HCC) 07/30/2014   Leukocytosis 11/26/2021   Lumbago    LV dysfunction  04/28/2017   Mixed hyperlipidemia 07/28/2014   OSA (obstructive sleep apnea)    CPAP   Paroxysmal atrial fibrillation (HCC) 07/28/2014   Postop check 01/06/2022   S/P CABG x 3 11/12/2021   Sinus node dysfunction (HCC) 04/21/2016   Testicular hypofunction    Uncontrolled type 2 diabetes mellitus with microalbuminuric diabetic nephropathy 07/28/2014   Past Surgical History:  Procedure Laterality Date   AV FISTULA PLACEMENT Right 05/20/2022   Procedure: RIGHT ARM ARTERIOVENOUS (AV) FISTULA CREATION;  Surgeon: Larina Earthly, MD;  [facility];  Service: Vascular;  Laterality: Right;   BUBBLE STUDY  11/21/2021   Procedure: BUBBLE STUDY;  Surgeon: Laurey Morale, MD;  Location: [facility] ENDOSCOPY;  Service: Cardiovascular;;   CARDIAC CATHETERIZATION     CARDIOVERSION N/A 11/21/2021   Procedure: CARDIOVERSION;  Surgeon: Laurey Morale, MD;  Location: [facility] ENDOSCOPY;  Service: Cardiovascular;  Laterality: N/A;   CARDIOVERSION N/A 11/25/2021   Procedure: CARDIOVERSION;  Surgeon: Laurey Morale, MD;  Location: [facility] ENDOSCOPY;  Service: Cardiovascular;  Laterality: N/A;   CARDIOVERSION N/A 11/29/2021   Procedure: CARDIOVERSION;  Surgeon: Laurey Morale, MD;  Location: [facility] ENDOSCOPY;  Service: Cardiovascular;  Laterality: N/A;   CARDIOVERSION N/A 02/27/2022   Procedure: CARDIOVERSION;  Surgeon: Little Ishikawa, MD;  Location: [facility] ENDOSCOPY;  Service: Cardiovascular;  Laterality: N/A;   CORONARY ARTERY BYPASS GRAFT N/A 11/12/2021   Procedure: CORONARY ARTERY BYPASS GRAFTING (CABG) x  3 USING LEFT INTERNAL MAMMARY ARTERY AND RIGHT GREATER SAPHENOUS VEIN;  Surgeon: Lovett Sox, MD;  Location: [facility]  OR;  Service: Open Heart Surgery;  Laterality: N/A;   ELECTROPHYSIOLOGIC STUDY N/A 01/18/2015   Procedure: CARDIOVERSION;  Surgeon: Lamar Blinks, MD;  Location: [facility] ORS;  Service: Cardiovascular;  Laterality: N/A;   ENDOVEIN HARVEST OF GREATER SAPHENOUS VEIN  11/12/2021   Procedure: ENDOVEIN  HARVEST OF GREATER SAPHENOUS VEIN;  Surgeon: Lovett Sox, MD;  [facility]: [facility] OR;  Service: Open Heart Surgery;;   EYE SURGERY Bilateral 2022   FISTULA SUPERFICIALIZATION Right 07/10/2022   Procedure: RIGHT ARM ARTERIOVENOUS FISTULA SUPERFICIALIZATION;  Surgeon: Maeola Harman, MD;  Location: [facility] OR;  Service: Vascular;  Laterality: Right;   IR FLUORO GUIDE CV LINE RIGHT  11/28/2021   IR US GUIDE VASC ACCESS RIGHT  11/28/2021   LEFT HEART CATH AND CORONARY ANGIOGRAPHY N/A 10/24/2021   Procedure: LEFT HEART CATH AND CORONARY ANGIOGRAPHY;  Surgeon: Corky Crafts, MD;  [facility] INVASIVE CV LAB;  Service: Cardiovascular;  Laterality: N/A;   MAZE N/A 11/12/2021   Procedure: MAZE;  Surgeon: Lovett Sox, MD;  Location: [facility] OR;  Service: Open Heart Surgery;  Laterality: N/A;   TEE WITHOUT CARDIOVERSION N/A 11/12/2021   Procedure: TRANSESOPHAGEAL ECHOCARDIOGRAM (TEE);  Surgeon: Lovett Sox, MD;  Location: [facility] OR;  Service: Open Heart Surgery;  Laterality: N/A;   TEE WITHOUT CARDIOVERSION N/A 11/21/2021   Procedure: TRANSESOPHAGEAL ECHOCARDIOGRAM (TEE);  Surgeon: Laurey Morale, MD;  Location: [facility] ENDOSCOPY;  Service: Cardiovascular;  Laterality: N/A;     Current Outpatient Medications  Medication Sig Dispense Refill   albuterol (VENTOLIN HFA) 108 (90 Base) MCG/ACT inhaler Inhale 2 puffs into the lungs every 6 (six) hours as needed for wheezing or shortness of breath.     amiodarone (PACERONE) 200 MG tablet Take 1 tablet (200 mg total) by mouth 2 (two) times daily. 180 tablet 3   atorvastatin (LIPITOR) 80 MG tablet Take 1 tablet (80 mg total) by mouth daily. 90 tablet 3   B Complex-C-Folic Acid (DIALYVITE 800) 0.8 MG TABS Take 1 tablet by mouth daily.     Dulaglutide (TRULICITY) 3 MG/0.5ML SOPN Inject 3 mg into the skin every Wednesday.     ELIQUIS 5 MG TABS tablet TAKE 1 TABLET TWICE DAILY 180 tablet 1   fluticasone furoate-vilanterol (BREO ELLIPTA) 200-25 MCG/ACT AEPB  Inhale 1 puff into the lungs daily.  3   gabapentin (NEURONTIN) 300 MG capsule Take 1 capsule (300 mg total) by mouth 2 (two) times daily for 60 doses. 60 capsule 0   Insulin Pen Needle 32G X 4 MM MISC Use to inject Levemir 2 (two) times daily. 100 each 1   LEVEMIR FLEXPEN 100 UNIT/ML FlexPen INJECT 30 UNITS UNDER THE SKIN TWICE DAILY 60 mL 3   metoprolol succinate (TOPROL-XL) 25 MG 24 hr tablet Take 25 mg by mouth daily.     midodrine (PROAMATINE) 5 MG tablet Take 1 tablet (5 mg total) by mouth daily. 90 tablet 3   torsemide (DEMADEX) 20 MG tablet Take 1 tablet (20 mg total) by mouth daily. 90 tablet 1   traMADol (ULTRAM) 50 MG tablet Take 1 tablet (50 mg total) by mouth every 8 (eight) hours as needed. (Patient not taking: Reported on 08/19/2022) 15 tablet 0   No current facility-administered medications for this visit.    Allergies:   Patient has no known allergies.   Social History:  The patient  reports that he quit smoking about 17 years ago. His smoking use included cigarettes. He has never used smokeless tobacco. He reports that  he does not currently use drugs after having used the following drugs: Marijuana. Frequency: 1.00 time per week. He reports that he does not drink alcohol.   Family History:  The patient's family history includes CAD in his brother and mother; Drug abuse in his brother; Lung cancer in his father.   ROS:  Please see the history of present illness.   Otherwise, review of systems is positive for none.   All other systems are reviewed and negative.   PHYSICAL EXAM: VS:  There were no vitals taken for this visit. , BMI There is no height or weight on file to calculate BMI. GEN: Well nourished, well developed, in no acute distress  HEENT: normal  Neck: no JVD, carotid bruits, or masses Cardiac: ***RRR; no murmurs, rubs, or gallops,no edema  Respiratory:  clear to auscultation bilaterally, normal work of breathing GI: soft, nontender, nondistended, + BS MS: no  deformity or atrophy  Skin: warm and dry Neuro:  Strength and sensation are intact Psych: euthymic mood, full affect  EKG:  EKG {ACTION; IS/IS [id number]} ordered today. Personal review of the ekg ordered *** shows ***   Recent Labs: 11/18/2021: ALT 9 12/04/2021: Magnesium 2.1 04/17/2022: Platelets 351 07/10/2022: BUN 48; Creatinine, Ser 4.70; Hemoglobin 10.9; Potassium 4.5; Sodium 139    Lipid Panel  No results found for: "CHOL", "TRIG", "HDL", "CHOLHDL", "VLDL", "LDLCALC", "LDLDIRECT"   Wt Readings from Last 3 Encounters:  09/15/22 262 lb (118.8 kg)  08/19/22 255 lb (115.7 kg)  07/30/22 259 lb (117.5 kg)      Other studies Reviewed: Additional studies/ records that were reviewed today include: TTE 07/30/21  Review of the above records today demonstrates:   1. Left ventricular ejection fraction, by estimation, is 50 to 55%. The  left ventricle has low normal function. Left ventricular endocardial  border not optimally defined to evaluate regional wall motion. Left  ventricular diastolic parameters are  indeterminate.   2. Right ventricular systolic function was not well visualized. The right  ventricular size is normal.   3. The mitral valve is degenerative. No evidence of mitral valve  regurgitation. No evidence of mitral stenosis.   4. The aortic valve has an indeterminant number of cusps. Aortic valve  regurgitation is not visualized. Aortic valve sclerosis/calcification is  present, without any evidence of aortic stenosis.    ASSESSMENT AND PLAN:  1.  Persistent atrial fibrillation/flutter: Currently on Eliquis and amiodarone.  Post surgical maze and currently on dialysis.  No other medication is possible.***  2.  End-stage renal disease: Currently on dialysis.  Continue plan per nephrology.  3.  Coronary artery disease: Status post CABG.  No current chest pain.  Plan per primary cardiology.  4.  Hypertension:***  5.  Secondary hypercoagulable state: Currently  on Eliquis for atrial fibrillation  6.  Obstructive sleep apnea: CPAP compliance encouraged  7.  High risk medication monitoring: Currently on amiodarone.  Bernis Stecher check TSH and LFTs today.   Current medicines are reviewed at length with the patient today.   The patient does not have concerns regarding his medicines.  The following changes were made today: ***  Labs/ tests ordered today include:  No orders of the defined types were placed in this encounter.    Disposition:   FU *** months  Signed, Azan Maneri Jorja Loa, MD  10/21/2022 8:26 AM     [facility] [address] [place] [address] [phone] (office) [phone] (fax)

## 2022-10-24 DIAGNOSIS — N2581 Secondary hyperparathyroidism of renal origin: Secondary | ICD-10-CM | POA: Diagnosis not present

## 2022-10-24 DIAGNOSIS — N186 End stage renal disease: Secondary | ICD-10-CM | POA: Diagnosis not present

## 2022-10-24 DIAGNOSIS — Z992 Dependence on renal dialysis: Secondary | ICD-10-CM | POA: Diagnosis not present

## 2022-10-27 DIAGNOSIS — N2581 Secondary hyperparathyroidism of renal origin: Secondary | ICD-10-CM | POA: Diagnosis not present

## 2022-10-27 DIAGNOSIS — N186 End stage renal disease: Secondary | ICD-10-CM | POA: Diagnosis not present

## 2022-10-27 DIAGNOSIS — Z992 Dependence on renal dialysis: Secondary | ICD-10-CM | POA: Diagnosis not present

## 2022-10-31 DIAGNOSIS — Z992 Dependence on renal dialysis: Secondary | ICD-10-CM | POA: Diagnosis not present

## 2022-10-31 DIAGNOSIS — N186 End stage renal disease: Secondary | ICD-10-CM | POA: Diagnosis not present

## 2022-10-31 DIAGNOSIS — N2581 Secondary hyperparathyroidism of renal origin: Secondary | ICD-10-CM | POA: Diagnosis not present

## 2022-11-03 DIAGNOSIS — N186 End stage renal disease: Secondary | ICD-10-CM | POA: Diagnosis not present

## 2022-11-03 DIAGNOSIS — Z992 Dependence on renal dialysis: Secondary | ICD-10-CM | POA: Diagnosis not present

## 2022-11-03 DIAGNOSIS — N2581 Secondary hyperparathyroidism of renal origin: Secondary | ICD-10-CM | POA: Diagnosis not present

## 2022-11-06 DIAGNOSIS — N186 End stage renal disease: Secondary | ICD-10-CM | POA: Diagnosis not present

## 2022-11-06 DIAGNOSIS — T82858A Stenosis of vascular prosthetic devices, implants and grafts, initial encounter: Secondary | ICD-10-CM | POA: Diagnosis not present

## 2022-11-06 DIAGNOSIS — G4733 Obstructive sleep apnea (adult) (pediatric): Secondary | ICD-10-CM | POA: Diagnosis not present

## 2022-11-06 DIAGNOSIS — R0600 Dyspnea, unspecified: Secondary | ICD-10-CM | POA: Diagnosis not present

## 2022-11-06 DIAGNOSIS — Z992 Dependence on renal dialysis: Secondary | ICD-10-CM | POA: Diagnosis not present

## 2022-11-06 DIAGNOSIS — J4540 Moderate persistent asthma, uncomplicated: Secondary | ICD-10-CM | POA: Diagnosis not present

## 2022-11-06 DIAGNOSIS — I871 Compression of vein: Secondary | ICD-10-CM | POA: Diagnosis not present

## 2022-11-07 DIAGNOSIS — Z992 Dependence on renal dialysis: Secondary | ICD-10-CM | POA: Diagnosis not present

## 2022-11-07 DIAGNOSIS — N186 End stage renal disease: Secondary | ICD-10-CM | POA: Diagnosis not present

## 2022-11-07 DIAGNOSIS — N2581 Secondary hyperparathyroidism of renal origin: Secondary | ICD-10-CM | POA: Diagnosis not present

## 2022-11-10 DIAGNOSIS — N186 End stage renal disease: Secondary | ICD-10-CM | POA: Diagnosis not present

## 2022-11-10 DIAGNOSIS — Z992 Dependence on renal dialysis: Secondary | ICD-10-CM | POA: Diagnosis not present

## 2022-11-10 DIAGNOSIS — N2581 Secondary hyperparathyroidism of renal origin: Secondary | ICD-10-CM | POA: Diagnosis not present

## 2022-11-14 DIAGNOSIS — N186 End stage renal disease: Secondary | ICD-10-CM | POA: Diagnosis not present

## 2022-11-14 DIAGNOSIS — N2581 Secondary hyperparathyroidism of renal origin: Secondary | ICD-10-CM | POA: Diagnosis not present

## 2022-11-14 DIAGNOSIS — Z992 Dependence on renal dialysis: Secondary | ICD-10-CM | POA: Diagnosis not present

## 2022-11-15 ENCOUNTER — Other Ambulatory Visit: Payer: Self-pay | Admitting: Cardiology

## 2022-11-17 DIAGNOSIS — N186 End stage renal disease: Secondary | ICD-10-CM | POA: Diagnosis not present

## 2022-11-17 DIAGNOSIS — N2581 Secondary hyperparathyroidism of renal origin: Secondary | ICD-10-CM | POA: Diagnosis not present

## 2022-11-17 DIAGNOSIS — Z992 Dependence on renal dialysis: Secondary | ICD-10-CM | POA: Diagnosis not present

## 2022-11-21 DIAGNOSIS — Z992 Dependence on renal dialysis: Secondary | ICD-10-CM | POA: Diagnosis not present

## 2022-11-21 DIAGNOSIS — N2581 Secondary hyperparathyroidism of renal origin: Secondary | ICD-10-CM | POA: Diagnosis not present

## 2022-11-21 DIAGNOSIS — N186 End stage renal disease: Secondary | ICD-10-CM | POA: Diagnosis not present

## 2022-11-21 DIAGNOSIS — N179 Acute kidney failure, unspecified: Secondary | ICD-10-CM | POA: Diagnosis not present

## 2022-11-24 DIAGNOSIS — N2581 Secondary hyperparathyroidism of renal origin: Secondary | ICD-10-CM | POA: Diagnosis not present

## 2022-11-24 DIAGNOSIS — N186 End stage renal disease: Secondary | ICD-10-CM | POA: Diagnosis not present

## 2022-11-24 DIAGNOSIS — Z992 Dependence on renal dialysis: Secondary | ICD-10-CM | POA: Diagnosis not present

## 2022-11-28 DIAGNOSIS — I959 Hypotension, unspecified: Secondary | ICD-10-CM | POA: Diagnosis not present

## 2022-11-28 DIAGNOSIS — L03116 Cellulitis of left lower limb: Secondary | ICD-10-CM | POA: Diagnosis not present

## 2022-11-28 DIAGNOSIS — N186 End stage renal disease: Secondary | ICD-10-CM | POA: Diagnosis not present

## 2022-11-28 DIAGNOSIS — N2581 Secondary hyperparathyroidism of renal origin: Secondary | ICD-10-CM | POA: Diagnosis not present

## 2022-11-28 DIAGNOSIS — Z992 Dependence on renal dialysis: Secondary | ICD-10-CM | POA: Diagnosis not present

## 2022-11-28 DIAGNOSIS — R1110 Vomiting, unspecified: Secondary | ICD-10-CM | POA: Diagnosis not present

## 2022-11-28 DIAGNOSIS — E1140 Type 2 diabetes mellitus with diabetic neuropathy, unspecified: Secondary | ICD-10-CM | POA: Diagnosis not present

## 2022-11-28 DIAGNOSIS — I2510 Atherosclerotic heart disease of native coronary artery without angina pectoris: Secondary | ICD-10-CM | POA: Diagnosis not present

## 2022-12-01 DIAGNOSIS — N186 End stage renal disease: Secondary | ICD-10-CM | POA: Diagnosis not present

## 2022-12-01 DIAGNOSIS — N2581 Secondary hyperparathyroidism of renal origin: Secondary | ICD-10-CM | POA: Diagnosis not present

## 2022-12-01 DIAGNOSIS — Z992 Dependence on renal dialysis: Secondary | ICD-10-CM | POA: Diagnosis not present

## 2022-12-02 DIAGNOSIS — L089 Local infection of the skin and subcutaneous tissue, unspecified: Secondary | ICD-10-CM | POA: Diagnosis not present

## 2022-12-02 DIAGNOSIS — Z6841 Body Mass Index (BMI) 40.0 and over, adult: Secondary | ICD-10-CM | POA: Diagnosis not present

## 2022-12-02 DIAGNOSIS — E11628 Type 2 diabetes mellitus with other skin complications: Secondary | ICD-10-CM | POA: Diagnosis not present

## 2022-12-02 DIAGNOSIS — L03116 Cellulitis of left lower limb: Secondary | ICD-10-CM | POA: Diagnosis not present

## 2022-12-05 DIAGNOSIS — N2581 Secondary hyperparathyroidism of renal origin: Secondary | ICD-10-CM | POA: Diagnosis not present

## 2022-12-05 DIAGNOSIS — Z992 Dependence on renal dialysis: Secondary | ICD-10-CM | POA: Diagnosis not present

## 2022-12-05 DIAGNOSIS — N186 End stage renal disease: Secondary | ICD-10-CM | POA: Diagnosis not present

## 2022-12-08 DIAGNOSIS — N2581 Secondary hyperparathyroidism of renal origin: Secondary | ICD-10-CM | POA: Diagnosis not present

## 2022-12-08 DIAGNOSIS — N186 End stage renal disease: Secondary | ICD-10-CM | POA: Diagnosis not present

## 2022-12-08 DIAGNOSIS — Z992 Dependence on renal dialysis: Secondary | ICD-10-CM | POA: Diagnosis not present

## 2022-12-09 DIAGNOSIS — S92422A Displaced fracture of distal phalanx of left great toe, initial encounter for closed fracture: Secondary | ICD-10-CM | POA: Diagnosis not present

## 2022-12-09 DIAGNOSIS — E11628 Type 2 diabetes mellitus with other skin complications: Secondary | ICD-10-CM | POA: Diagnosis not present

## 2022-12-09 DIAGNOSIS — L089 Local infection of the skin and subcutaneous tissue, unspecified: Secondary | ICD-10-CM | POA: Diagnosis not present

## 2022-12-09 DIAGNOSIS — E1140 Type 2 diabetes mellitus with diabetic neuropathy, unspecified: Secondary | ICD-10-CM | POA: Diagnosis not present

## 2022-12-10 DIAGNOSIS — I739 Peripheral vascular disease, unspecified: Secondary | ICD-10-CM | POA: Diagnosis not present

## 2022-12-10 DIAGNOSIS — L97529 Non-pressure chronic ulcer of other part of left foot with unspecified severity: Secondary | ICD-10-CM | POA: Diagnosis not present

## 2022-12-10 DIAGNOSIS — L03116 Cellulitis of left lower limb: Secondary | ICD-10-CM | POA: Diagnosis not present

## 2022-12-10 DIAGNOSIS — Z87891 Personal history of nicotine dependence: Secondary | ICD-10-CM | POA: Diagnosis not present

## 2022-12-10 DIAGNOSIS — I96 Gangrene, not elsewhere classified: Secondary | ICD-10-CM | POA: Diagnosis not present

## 2022-12-10 DIAGNOSIS — S91102A Unspecified open wound of left great toe without damage to nail, initial encounter: Secondary | ICD-10-CM | POA: Diagnosis not present

## 2022-12-10 DIAGNOSIS — L03032 Cellulitis of left toe: Secondary | ICD-10-CM | POA: Diagnosis not present

## 2022-12-10 DIAGNOSIS — E11628 Type 2 diabetes mellitus with other skin complications: Secondary | ICD-10-CM | POA: Diagnosis not present

## 2022-12-10 DIAGNOSIS — E11621 Type 2 diabetes mellitus with foot ulcer: Secondary | ICD-10-CM | POA: Diagnosis not present

## 2022-12-10 DIAGNOSIS — E1140 Type 2 diabetes mellitus with diabetic neuropathy, unspecified: Secondary | ICD-10-CM | POA: Diagnosis not present

## 2022-12-12 DIAGNOSIS — N2581 Secondary hyperparathyroidism of renal origin: Secondary | ICD-10-CM | POA: Diagnosis not present

## 2022-12-12 DIAGNOSIS — Z992 Dependence on renal dialysis: Secondary | ICD-10-CM | POA: Diagnosis not present

## 2022-12-12 DIAGNOSIS — N186 End stage renal disease: Secondary | ICD-10-CM | POA: Diagnosis not present

## 2022-12-15 DIAGNOSIS — N186 End stage renal disease: Secondary | ICD-10-CM | POA: Diagnosis not present

## 2022-12-15 DIAGNOSIS — Z992 Dependence on renal dialysis: Secondary | ICD-10-CM | POA: Diagnosis not present

## 2022-12-15 DIAGNOSIS — N2581 Secondary hyperparathyroidism of renal origin: Secondary | ICD-10-CM | POA: Diagnosis not present

## 2022-12-16 DIAGNOSIS — N186 End stage renal disease: Secondary | ICD-10-CM | POA: Diagnosis not present

## 2022-12-16 DIAGNOSIS — I739 Peripheral vascular disease, unspecified: Secondary | ICD-10-CM | POA: Diagnosis not present

## 2022-12-16 DIAGNOSIS — I2510 Atherosclerotic heart disease of native coronary artery without angina pectoris: Secondary | ICD-10-CM | POA: Diagnosis not present

## 2022-12-16 DIAGNOSIS — E782 Mixed hyperlipidemia: Secondary | ICD-10-CM | POA: Diagnosis not present

## 2022-12-16 DIAGNOSIS — I4891 Unspecified atrial fibrillation: Secondary | ICD-10-CM | POA: Diagnosis not present

## 2022-12-16 DIAGNOSIS — I5020 Unspecified systolic (congestive) heart failure: Secondary | ICD-10-CM | POA: Diagnosis not present

## 2022-12-16 DIAGNOSIS — Z992 Dependence on renal dialysis: Secondary | ICD-10-CM | POA: Diagnosis not present

## 2022-12-16 DIAGNOSIS — I96 Gangrene, not elsewhere classified: Secondary | ICD-10-CM | POA: Diagnosis not present

## 2022-12-16 DIAGNOSIS — E1140 Type 2 diabetes mellitus with diabetic neuropathy, unspecified: Secondary | ICD-10-CM | POA: Diagnosis not present

## 2022-12-17 DIAGNOSIS — E1151 Type 2 diabetes mellitus with diabetic peripheral angiopathy without gangrene: Secondary | ICD-10-CM | POA: Diagnosis not present

## 2022-12-17 DIAGNOSIS — M869 Osteomyelitis, unspecified: Secondary | ICD-10-CM | POA: Diagnosis not present

## 2022-12-17 DIAGNOSIS — I96 Gangrene, not elsewhere classified: Secondary | ICD-10-CM | POA: Diagnosis not present

## 2022-12-17 DIAGNOSIS — M84478S Pathological fracture, left toe(s), sequela: Secondary | ICD-10-CM | POA: Diagnosis not present

## 2022-12-19 DIAGNOSIS — N186 End stage renal disease: Secondary | ICD-10-CM | POA: Diagnosis not present

## 2022-12-19 DIAGNOSIS — Z992 Dependence on renal dialysis: Secondary | ICD-10-CM | POA: Diagnosis not present

## 2022-12-19 DIAGNOSIS — N2581 Secondary hyperparathyroidism of renal origin: Secondary | ICD-10-CM | POA: Diagnosis not present

## 2022-12-21 DIAGNOSIS — N179 Acute kidney failure, unspecified: Secondary | ICD-10-CM | POA: Diagnosis not present

## 2022-12-21 DIAGNOSIS — N186 End stage renal disease: Secondary | ICD-10-CM | POA: Diagnosis not present

## 2022-12-21 DIAGNOSIS — Z992 Dependence on renal dialysis: Secondary | ICD-10-CM | POA: Diagnosis not present

## 2022-12-22 DIAGNOSIS — N2581 Secondary hyperparathyroidism of renal origin: Secondary | ICD-10-CM | POA: Diagnosis not present

## 2022-12-22 DIAGNOSIS — Z992 Dependence on renal dialysis: Secondary | ICD-10-CM | POA: Diagnosis not present

## 2022-12-22 DIAGNOSIS — N186 End stage renal disease: Secondary | ICD-10-CM | POA: Diagnosis not present

## 2022-12-24 ENCOUNTER — Encounter: Payer: Self-pay | Admitting: Vascular Surgery

## 2022-12-24 ENCOUNTER — Ambulatory Visit (INDEPENDENT_AMBULATORY_CARE_PROVIDER_SITE_OTHER): Payer: Medicare HMO | Admitting: Vascular Surgery

## 2022-12-24 VITALS — BP 133/74 | HR 56 | Temp 98.2°F | Resp 20 | Ht 70.0 in | Wt 260.2 lb

## 2022-12-24 DIAGNOSIS — N186 End stage renal disease: Secondary | ICD-10-CM | POA: Diagnosis not present

## 2022-12-24 DIAGNOSIS — I70262 Atherosclerosis of native arteries of extremities with gangrene, left leg: Secondary | ICD-10-CM | POA: Diagnosis not present

## 2022-12-24 NOTE — H&P (View-Only) (Signed)
 Patient ID: [name] [name], male   DOB: 06/17/1956, 66 y.o.   MRN: [id number]  Reason for Consult: Follow-up   Referred by Conroy, Nathan, PA-C  Subjective:     HPI:  [name] [name] is a 66 y.o. male history of end-stage renal disease currently on Mondays and Fridays via right upper arm AV fistula.  He is followed by Dr. Stover with podiatry for new left great toe gangrene which occurred from low-grade trauma.  He has been maintained on antibiotics due to streaking erythema that was previously on the foot but has resolved.  Currently denies any fevers or chills.  He has been dressing the wound with Betadine.  He walks without limitation.  Past Medical History:  Diagnosis Date   Anemia 04/17/2022   Blood transfusion   Atherosclerotic heart disease    Benign essential HTN 07/30/2014   Bradycardia 01/25/2015   CHF (congestive heart failure) (HCC)    Chronic anticoagulation 02/25/2018   Chronic systolic CHF (congestive heart failure), NYHA class 3 (HCC) 08/30/2014   Overview:  Global ef 30%   CKD (chronic kidney disease)    Class 3 severe obesity in adult (HCC) 04/28/2017   Coronary artery disease    Diabetes mellitus without complication (HCC)    Diabetic neuropathy (HCC) 07/28/2014   Dysrhythmia    Erectile dysfunction    High risk medication use 05/26/2018   Hypertensive heart disease with heart failure (HCC) 07/30/2014   Leukocytosis 11/26/2021   Lumbago    LV dysfunction 04/28/2017   Mixed hyperlipidemia 07/28/2014   OSA (obstructive sleep apnea)    CPAP   Paroxysmal atrial fibrillation (HCC) 07/28/2014   Postop check 01/06/2022   S/P CABG x 3 11/12/2021   Sinus node dysfunction (HCC) 04/21/2016   Testicular hypofunction    Uncontrolled type 2 diabetes mellitus with microalbuminuric diabetic nephropathy 07/28/2014   Family History  Problem Relation Age of Onset   CAD Brother    Drug abuse Brother    Lung cancer Father    CAD Mother    Past Surgical History:   Procedure Laterality Date   AV FISTULA PLACEMENT Right 05/20/2022   Procedure: RIGHT ARM ARTERIOVENOUS (AV) FISTULA CREATION;  Surgeon: Early, Todd F, MD;  [facility]: [facility] OR;  Service: Vascular;  Laterality: Right;   BUBBLE STUDY  11/21/2021   Procedure: BUBBLE STUDY;  Surgeon: McLean, Dalton S, MD;  Location: [facility] ENDOSCOPY;  Service: Cardiovascular;;   CARDIAC CATHETERIZATION     CARDIOVERSION N/A 11/21/2021   Procedure: CARDIOVERSION;  Surgeon: McLean, Dalton S, MD;  Location: [facility] ENDOSCOPY;  Service: Cardiovascular;  Laterality: N/A;   CARDIOVERSION N/A 11/25/2021   Procedure: CARDIOVERSION;  Surgeon: McLean, Dalton S, MD;  Location: [facility] ENDOSCOPY;  Service: Cardiovascular;  Laterality: N/A;   CARDIOVERSION N/A 11/29/2021   Procedure: CARDIOVERSION;  Surgeon: McLean, Dalton S, MD;  Location: [facility] ENDOSCOPY;  Service: Cardiovascular;  Laterality: N/A;   CARDIOVERSION N/A 02/27/2022   Procedure: CARDIOVERSION;  Surgeon: Schumann, [name] L, MD;  Location: [facility] ENDOSCOPY;  Service: Cardiovascular;  Laterality: N/A;   CORONARY ARTERY BYPASS GRAFT N/A 11/12/2021   Procedure: CORONARY ARTERY BYPASS GRAFTING (CABG) x  3 USING LEFT INTERNAL MAMMARY ARTERY AND RIGHT GREATER SAPHENOUS VEIN;  Surgeon: Vantrigt, Peter, MD;  Location: [facility] OR;  Service: Open Heart Surgery;  Laterality: N/A;   ELECTROPHYSIOLOGIC STUDY N/A 01/18/2015   Procedure: CARDIOVERSION;  Surgeon: Bruce J Kowalski, MD;  Location: [facility] ORS;  Service: Cardiovascular;  Laterality: N/A;   ENDOVEIN HARVEST OF GREATER   SAPHENOUS VEIN  11/12/2021   Procedure: ENDOVEIN HARVEST OF GREATER SAPHENOUS VEIN;  Surgeon: Vantrigt, Peter, MD;  Location: [facility] OR;  Service: Open Heart Surgery;;   EYE SURGERY Bilateral 2022   FISTULA SUPERFICIALIZATION Right 07/10/2022   Procedure: RIGHT ARM ARTERIOVENOUS FISTULA SUPERFICIALIZATION;  Surgeon: Vietta Bonifield Christopher, MD;  Location: [facility] OR;  Service: Vascular;  Laterality: Right;   IR FLUORO GUIDE CV LINE  RIGHT  11/28/2021   IR US GUIDE VASC ACCESS RIGHT  11/28/2021   LEFT HEART CATH AND CORONARY ANGIOGRAPHY N/A 10/24/2021   Procedure: LEFT HEART CATH AND CORONARY ANGIOGRAPHY;  Surgeon: Varanasi, Jayadeep S, MD;  [facility] INVASIVE CV LAB;  Service: Cardiovascular;  Laterality: N/A;   MAZE N/A 11/12/2021   Procedure: MAZE;  Surgeon: Vantrigt, Peter, MD;  Location: [facility] OR;  Service: Open Heart Surgery;  Laterality: N/A;   TEE WITHOUT CARDIOVERSION N/A 11/12/2021   Procedure: TRANSESOPHAGEAL ECHOCARDIOGRAM (TEE);  Surgeon: Vantrigt, Peter, MD;  Location: [facility] OR;  Service: Open Heart Surgery;  Laterality: N/A;   TEE WITHOUT CARDIOVERSION N/A 11/21/2021   Procedure: TRANSESOPHAGEAL ECHOCARDIOGRAM (TEE);  Surgeon: McLean, Dalton S, MD;  Location: [facility] ENDOSCOPY;  Service: Cardiovascular;  Laterality: N/A;    Short Social History:  Social History   Tobacco Use   Smoking status: Former    Types: Cigarettes    Quit date: 2007    Years since quitting: 17.5   Smokeless tobacco: Never  Substance Use Topics   Alcohol use: No    No Known Allergies  Current Outpatient Medications  Medication Sig Dispense Refill   albuterol (VENTOLIN HFA) 108 (90 Base) MCG/ACT inhaler Inhale 2 puffs into the lungs every 6 (six) hours as needed for wheezing or shortness of breath.     amiodarone (PACERONE) 200 MG tablet Take 1 tablet (200 mg total) by mouth 2 (two) times daily. 180 tablet 3   atorvastatin (LIPITOR) 80 MG tablet Take 1 tablet (80 mg total) by mouth daily. 90 tablet 2   B Complex-C-Folic Acid (DIALYVITE 800) 0.8 MG TABS Take 1 tablet by mouth daily.     cephALEXin (KEFLEX) 500 MG capsule Take 500 mg by mouth 2 (two) times daily.     Dulaglutide (TRULICITY) 3 MG/0.5ML SOPN Inject 3 mg into the skin every Wednesday.     ELIQUIS 5 MG TABS tablet TAKE 1 TABLET TWICE DAILY 180 tablet 1   fluticasone furoate-vilanterol (BREO ELLIPTA) 200-25 MCG/ACT AEPB Inhale 1 puff into the lungs daily.  3   Insulin Pen  Needle 32G X 4 MM MISC Use to inject Levemir 2 (two) times daily. 100 each 1   LEVEMIR FLEXPEN 100 UNIT/ML FlexPen INJECT 30 UNITS UNDER THE SKIN TWICE DAILY 60 mL 3   metoprolol succinate (TOPROL-XL) 25 MG 24 hr tablet Take 25 mg by mouth daily.     midodrine (PROAMATINE) 5 MG tablet Take 1 tablet (5 mg total) by mouth daily. 90 tablet 3   torsemide (DEMADEX) 20 MG tablet Take 1 tablet (20 mg total) by mouth daily. 90 tablet 1   traMADol (ULTRAM) 50 MG tablet Take 1 tablet (50 mg total) by mouth every 8 (eight) hours as needed. 15 tablet 0   No current facility-administered medications for this visit.    Review of Systems  Constitutional:  Constitutional negative. HENT: HENT negative.  Eyes: Eyes negative.  Cardiovascular: Cardiovascular negative.  GI: Gastrointestinal negative.  Musculoskeletal: Musculoskeletal negative.  Skin: Positive for wound.  Neurological: Neurological negative. Hematologic: Hematologic/lymphatic negative.    Psychiatric: Psychiatric negative.        Objective:  Objective   Vitals:   12/24/22 1416  BP: 133/74  Pulse: (!) 56  Resp: 20  Temp: 98.2 F (36.8 C)  SpO2: 97%  Weight: 260 lb 3.2 oz (118 kg)  Height: 5' 10" (1.778 m)   Body mass index is 37.33 kg/m.  Physical Exam HENT:     Head: Normocephalic.     Nose: Nose normal.  Eyes:     Pupils: Pupils are equal, round, and reactive to light.  Cardiovascular:     Rate and Rhythm: Normal rate.     Pulses:          Femoral pulses are 0 on the right side and 0 on the left side.      Popliteal pulses are 0 on the right side and 0 on the left side.  Pulmonary:     Effort: Pulmonary effort is normal.  Abdominal:     General: Abdomen is flat.  Musculoskeletal:     Cervical back: Normal range of motion and neck supple.     Comments: Right upper arm avf  Skin:    Capillary Refill: Capillary refill takes less than 2 seconds.  Neurological:     General: No focal deficit present.     Mental  Status: He is alert.  Psychiatric:        Mood and Affect: Mood normal.     Data: ABI performed at [facility]  nondiagnostic lower extremity examination due to noncompressible vessels with likely underlying significant arterial occlusive disease given monophasic waveforms.     Assessment/Plan:    66-year-old male with chronic left lower extremity limb threatening ischemia with great toe gangrene and recently had erythema that is mostly resolving on antibiotics.  Plan will be for angiography from the right common femoral approach possible intervention of his aortoiliac as well as femoral-popliteal and possible tibial levels.  All questions were answered today.  Will hold Eliquis 48 hours prior to procedure.     [name] [name] has atherosclerosis of the native arteries of the Left lower extremities causing gangrene. The patient is on best medical therapy for peripheral arterial disease. The patient has been counseled about the risks of tobacco use in atherosclerotic disease. The patient has been counseled to abstain from any tobacco use. An aortogram with bilateral lower extremity runoff angiography and Left lower extremity intervention and is indicated to better evaluate the patient's lower extremity circulation because of the limb threatening nature of the patient's diagnosis. Based on the patient's clinical exam and non-invasive data, we anticipate an endovascular intervention in the terminal aortic, iliac, femoropopliteal, and tibial vessels.  Stenting or athrectomy would be favored because of the improved primary patency of these interventions as compared to plain balloon angioplasty.   [name] [name] [name] Kathryn Cosby MD [facility]   

## 2022-12-24 NOTE — Progress Notes (Signed)
Patient ID: Nicos Nicley, male   DOB: 1955-07-30, 67 y.o.   MRN: [id number]  Reason for Consult: Follow-up   Referred by [name], PA-C  Subjective:     HPI:  Keary Mckissack is a 67 y.o. male history of end-stage renal disease currently on Mondays and Fridays via right upper arm AV fistula.  He is followed by Dr. Marylene Land with podiatry for new left great toe gangrene which occurred from low-grade trauma.  He has been maintained on antibiotics due to streaking erythema that was previously on the foot but has resolved.  Currently denies any fevers or chills.  He has been dressing the wound with Betadine.  He walks without limitation.  Past Medical History:  Diagnosis Date   Anemia 04/17/2022   Blood transfusion   Atherosclerotic heart disease    Benign essential HTN 07/30/2014   Bradycardia 01/25/2015   CHF (congestive heart failure) (HCC)    Chronic anticoagulation 02/25/2018   Chronic systolic CHF (congestive heart failure), NYHA class 3 (HCC) 08/30/2014   Overview:  Global ef 30%   CKD (chronic kidney disease)    Class 3 severe obesity in adult [facility] 04/28/2017   Coronary artery disease    Diabetes mellitus without complication (HCC)    Diabetic neuropathy (HCC) 07/28/2014   Dysrhythmia    Erectile dysfunction    High risk medication use 05/26/2018   Hypertensive heart disease with heart failure (HCC) 07/30/2014   Leukocytosis 11/26/2021   Lumbago    LV dysfunction 04/28/2017   Mixed hyperlipidemia 07/28/2014   OSA (obstructive sleep apnea)    CPAP   Paroxysmal atrial fibrillation (HCC) 07/28/2014   Postop check 01/06/2022   S/P CABG x 3 11/12/2021   Sinus node dysfunction (HCC) 04/21/2016   Testicular hypofunction    Uncontrolled type 2 diabetes mellitus with microalbuminuric diabetic nephropathy 07/28/2014   Family History  Problem Relation Age of Onset   CAD Brother    Drug abuse Brother    Lung cancer Father    CAD Mother    Past Surgical History:   Procedure Laterality Date   AV FISTULA PLACEMENT Right 05/20/2022   Procedure: RIGHT ARM ARTERIOVENOUS (AV) FISTULA CREATION;  Surgeon: Larina Earthly, MD;  Location: [facility] OR;  Service: Vascular;  Laterality: Right;   BUBBLE STUDY  11/21/2021   Procedure: BUBBLE STUDY;  Surgeon: Laurey Morale, MD;  Location: [facility] ENDOSCOPY;  Service: Cardiovascular;;   CARDIAC CATHETERIZATION     CARDIOVERSION N/A 11/21/2021   Procedure: CARDIOVERSION;  Surgeon: Laurey Morale, MD;  Location: [facility] ENDOSCOPY;  Service: Cardiovascular;  Laterality: N/A;   CARDIOVERSION N/A 11/25/2021   Procedure: CARDIOVERSION;  Surgeon: Laurey Morale, MD;  Location: [facility] ENDOSCOPY;  Service: Cardiovascular;  Laterality: N/A;   CARDIOVERSION N/A 11/29/2021   Procedure: CARDIOVERSION;  Surgeon: Laurey Morale, MD;  Location: [facility] ENDOSCOPY;  Service: Cardiovascular;  Laterality: N/A;   CARDIOVERSION N/A 02/27/2022   Procedure: CARDIOVERSION;  Surgeon: Little Ishikawa, MD;  Location: [facility] ENDOSCOPY;  Service: Cardiovascular;  Laterality: N/A;   CORONARY ARTERY BYPASS GRAFT N/A 11/12/2021   Procedure: CORONARY ARTERY BYPASS GRAFTING (CABG) x  3 USING LEFT INTERNAL MAMMARY ARTERY AND RIGHT GREATER SAPHENOUS VEIN;  Surgeon: Lovett Sox, MD;  Location: [facility] OR;  Service: Open Heart Surgery;  Laterality: N/A;   ELECTROPHYSIOLOGIC STUDY N/A 01/18/2015   Procedure: CARDIOVERSION;  Surgeon: Lamar Blinks, MD;  Location: [facility] ORS;  Service: Cardiovascular;  Laterality: N/A;   ENDOVEIN HARVEST OF GREATER  SAPHENOUS VEIN  11/12/2021   Procedure: ENDOVEIN HARVEST OF GREATER SAPHENOUS VEIN;  Surgeon: Lovett Sox, MD;  Location: [facility] OR;  Service: Open Heart Surgery;;   EYE SURGERY Bilateral 2022   FISTULA SUPERFICIALIZATION Right 07/10/2022   Procedure: RIGHT ARM ARTERIOVENOUS FISTULA SUPERFICIALIZATION;  Surgeon: Maeola Harman, MD;  Location: [facility] OR;  Service: Vascular;  Laterality: Right;   IR FLUORO GUIDE CV LINE  RIGHT  11/28/2021   IR US GUIDE VASC ACCESS RIGHT  11/28/2021   LEFT HEART CATH AND CORONARY ANGIOGRAPHY N/A 10/24/2021   Procedure: LEFT HEART CATH AND CORONARY ANGIOGRAPHY;  Surgeon: Corky Crafts, MD;  Location: [facility] INVASIVE CV LAB;  Service: Cardiovascular;  Laterality: N/A;   MAZE N/A 11/12/2021   Procedure: MAZE;  Surgeon: Lovett Sox, MD;  Location: [facility] OR;  Service: Open Heart Surgery;  Laterality: N/A;   TEE WITHOUT CARDIOVERSION N/A 11/12/2021   Procedure: TRANSESOPHAGEAL ECHOCARDIOGRAM (TEE);  Surgeon: Lovett Sox, MD;  Location: [facility] OR;  Service: Open Heart Surgery;  Laterality: N/A;   TEE WITHOUT CARDIOVERSION N/A 11/21/2021   Procedure: TRANSESOPHAGEAL ECHOCARDIOGRAM (TEE);  Surgeon: Laurey Morale, MD;  Location: [facility] ENDOSCOPY;  Service: Cardiovascular;  Laterality: N/A;    Short Social History:  Social History   Tobacco Use   Smoking status: Former    Types: Cigarettes    Quit date: 2007    Years since quitting: 17.5   Smokeless tobacco: Never  Substance Use Topics   Alcohol use: No    No Known Allergies  Current Outpatient Medications  Medication Sig Dispense Refill   albuterol (VENTOLIN HFA) 108 (90 Base) MCG/ACT inhaler Inhale 2 puffs into the lungs every 6 (six) hours as needed for wheezing or shortness of breath.     amiodarone (PACERONE) 200 MG tablet Take 1 tablet (200 mg total) by mouth 2 (two) times daily. 180 tablet 3   atorvastatin (LIPITOR) 80 MG tablet Take 1 tablet (80 mg total) by mouth daily. 90 tablet 2   B Complex-C-Folic Acid (DIALYVITE 800) 0.8 MG TABS Take 1 tablet by mouth daily.     cephALEXin (KEFLEX) 500 MG capsule Take 500 mg by mouth 2 (two) times daily.     Dulaglutide (TRULICITY) 3 MG/0.5ML SOPN Inject 3 mg into the skin every Wednesday.     ELIQUIS 5 MG TABS tablet TAKE 1 TABLET TWICE DAILY 180 tablet 1   fluticasone furoate-vilanterol (BREO ELLIPTA) 200-25 MCG/ACT AEPB Inhale 1 puff into the lungs daily.  3   Insulin Pen  Needle 32G X 4 MM MISC Use to inject Levemir 2 (two) times daily. 100 each 1   LEVEMIR FLEXPEN 100 UNIT/ML FlexPen INJECT 30 UNITS UNDER THE SKIN TWICE DAILY 60 mL 3   metoprolol succinate (TOPROL-XL) 25 MG 24 hr tablet Take 25 mg by mouth daily.     midodrine (PROAMATINE) 5 MG tablet Take 1 tablet (5 mg total) by mouth daily. 90 tablet 3   torsemide (DEMADEX) 20 MG tablet Take 1 tablet (20 mg total) by mouth daily. 90 tablet 1   traMADol (ULTRAM) 50 MG tablet Take 1 tablet (50 mg total) by mouth every 8 (eight) hours as needed. 15 tablet 0   No current facility-administered medications for this visit.    Review of Systems  Constitutional:  Constitutional negative. HENT: HENT negative.  Eyes: Eyes negative.  Cardiovascular: Cardiovascular negative.  GI: Gastrointestinal negative.  Musculoskeletal: Musculoskeletal negative.  Skin: Positive for wound.  Neurological: Neurological negative. Hematologic: Hematologic/lymphatic negative.  Psychiatric: Psychiatric negative.        Objective:  Objective   Vitals:   12/24/22 1416  BP: 133/74  Pulse: (!) 56  Resp: 20  Temp: 98.2 F (36.8 C)  SpO2: 97%  Weight: 260 lb 3.2 oz (118 kg)  Height: 5\' 10"  (1.778 m)   Body mass index is 37.33 kg/m.  Physical Exam HENT:     Head: Normocephalic.     Nose: Nose normal.  Eyes:     Pupils: Pupils are equal, round, and reactive to light.  Cardiovascular:     Rate and Rhythm: Normal rate.     Pulses:          Femoral pulses are 0 on the right side and 0 on the left side.      Popliteal pulses are 0 on the right side and 0 on the left side.  Pulmonary:     Effort: Pulmonary effort is normal.  Abdominal:     General: Abdomen is flat.  Musculoskeletal:     Cervical back: Normal range of motion and neck supple.     Comments: Right upper arm avf  Skin:    Capillary Refill: Capillary refill takes less than 2 seconds.  Neurological:     General: No focal deficit present.     Mental  Status: He is alert.  Psychiatric:        Mood and Affect: Mood normal.     Data: ABI performed at [facility]  nondiagnostic lower extremity examination due to noncompressible vessels with likely underlying significant arterial occlusive disease given monophasic waveforms.     Assessment/Plan:    67 year old male with chronic left lower extremity limb threatening ischemia with great toe gangrene and recently had erythema that is mostly resolving on antibiotics.  Plan will be for angiography from the right common femoral approach possible intervention of his aortoiliac as well as femoral-popliteal and possible tibial levels.  All questions were answered today.  Will hold Eliquis 48 hours prior to procedure.     Rohan Brixey has atherosclerosis of the native arteries of the Left lower extremities causing gangrene. The patient is on best medical therapy for peripheral arterial disease. The patient has been counseled about the risks of tobacco use in atherosclerotic disease. The patient has been counseled to abstain from any tobacco use. An aortogram with bilateral lower extremity runoff angiography and Left lower extremity intervention and is indicated to better evaluate the patient's lower extremity circulation because of the limb threatening nature of the patient's diagnosis. Based on the patient's clinical exam and non-invasive data, we anticipate an endovascular intervention in the terminal aortic, iliac, femoropopliteal, and tibial vessels.  Stenting or athrectomy would be favored because of the improved primary patency of these interventions as compared to plain balloon angioplasty.   Maeola Harman MD [facility]

## 2022-12-26 ENCOUNTER — Other Ambulatory Visit: Payer: Self-pay

## 2022-12-26 DIAGNOSIS — N186 End stage renal disease: Secondary | ICD-10-CM | POA: Diagnosis not present

## 2022-12-26 DIAGNOSIS — Z992 Dependence on renal dialysis: Secondary | ICD-10-CM | POA: Diagnosis not present

## 2022-12-26 DIAGNOSIS — N2581 Secondary hyperparathyroidism of renal origin: Secondary | ICD-10-CM | POA: Diagnosis not present

## 2022-12-26 DIAGNOSIS — I70262 Atherosclerosis of native arteries of extremities with gangrene, left leg: Secondary | ICD-10-CM

## 2022-12-26 MED ORDER — SODIUM CHLORIDE 0.9 % IV SOLN: 250.0000 mL | INTRAVENOUS | Status: DC | PRN

## 2022-12-26 MED ORDER — SODIUM CHLORIDE 0.9% FLUSH: 3.0000 mL | Freq: Two times a day (BID) | INTRAVENOUS | Status: DC

## 2022-12-29 ENCOUNTER — Ambulatory Visit (HOSPITAL_COMMUNITY)
Admission: RE | Admit: 2022-12-29 | Discharge: 2022-12-29 | Disposition: A | Discharge status: Home / Self Care | Payer: Medicare HMO | Attending: Vascular Surgery | Admitting: Vascular Surgery

## 2022-12-29 ENCOUNTER — Ambulatory Visit (HOSPITAL_COMMUNITY)
Admission: RE | Disposition: A | Discharge status: Home / Self Care | Payer: Self-pay | Source: Home / Self Care | Attending: Vascular Surgery

## 2022-12-29 ENCOUNTER — Other Ambulatory Visit: Payer: Self-pay

## 2022-12-29 DIAGNOSIS — E1152 Type 2 diabetes mellitus with diabetic peripheral angiopathy with gangrene: Secondary | ICD-10-CM | POA: Insufficient documentation

## 2022-12-29 DIAGNOSIS — I70245 Atherosclerosis of native arteries of left leg with ulceration of other part of foot: Secondary | ICD-10-CM

## 2022-12-29 DIAGNOSIS — Z992 Dependence on renal dialysis: Secondary | ICD-10-CM | POA: Insufficient documentation

## 2022-12-29 DIAGNOSIS — I132 Hypertensive heart and chronic kidney disease with heart failure and with stage 5 chronic kidney disease, or end stage renal disease: Secondary | ICD-10-CM | POA: Diagnosis not present

## 2022-12-29 DIAGNOSIS — N186 End stage renal disease: Secondary | ICD-10-CM | POA: Diagnosis not present

## 2022-12-29 DIAGNOSIS — Z7901 Long term (current) use of anticoagulants: Secondary | ICD-10-CM | POA: Diagnosis not present

## 2022-12-29 DIAGNOSIS — E1122 Type 2 diabetes mellitus with diabetic chronic kidney disease: Secondary | ICD-10-CM | POA: Insufficient documentation

## 2022-12-29 DIAGNOSIS — Z87891 Personal history of nicotine dependence: Secondary | ICD-10-CM | POA: Diagnosis not present

## 2022-12-29 DIAGNOSIS — L97529 Non-pressure chronic ulcer of other part of left foot with unspecified severity: Secondary | ICD-10-CM | POA: Diagnosis not present

## 2022-12-29 DIAGNOSIS — Z7985 Long-term (current) use of injectable non-insulin antidiabetic drugs: Secondary | ICD-10-CM | POA: Insufficient documentation

## 2022-12-29 DIAGNOSIS — I70262 Atherosclerosis of native arteries of extremities with gangrene, left leg: Secondary | ICD-10-CM | POA: Diagnosis not present

## 2022-12-29 DIAGNOSIS — I5022 Chronic systolic (congestive) heart failure: Secondary | ICD-10-CM | POA: Diagnosis not present

## 2022-12-29 DIAGNOSIS — Z794 Long term (current) use of insulin: Secondary | ICD-10-CM | POA: Diagnosis not present

## 2022-12-29 DIAGNOSIS — E11621 Type 2 diabetes mellitus with foot ulcer: Secondary | ICD-10-CM | POA: Diagnosis not present

## 2022-12-29 HISTORY — PX: PERIPHERAL VASCULAR INTERVENTION: CATH118257

## 2022-12-29 HISTORY — PX: PERIPHERAL VASCULAR ATHERECTOMY: CATH118256

## 2022-12-29 HISTORY — PX: ABDOMINAL AORTOGRAM W/LOWER EXTREMITY: CATH118223

## 2022-12-29 LAB — POCT I-STAT, CHEM 8
BUN: 71 mg/dL — ABNORMAL HIGH (ref 8–23)
Calcium, Ion: 1.13 mmol/L — ABNORMAL LOW (ref 1.15–1.40)
Chloride: 106 mmol/L (ref 98–111)
Creatinine, Ser: 6.4 mg/dL — ABNORMAL HIGH (ref 0.61–1.24)
Glucose, Bld: 139 mg/dL — ABNORMAL HIGH (ref 70–99)
HCT: 33 % — ABNORMAL LOW (ref 39.0–52.0)
Hemoglobin: 11.2 g/dL — ABNORMAL LOW (ref 13.0–17.0)
Potassium: 4.7 mmol/L (ref 3.5–5.1)
Sodium: 140 mmol/L (ref 135–145)
TCO2: 21 mmol/L — ABNORMAL LOW (ref 22–32)

## 2022-12-29 LAB — POCT ACTIVATED CLOTTING TIME: Activated Clotting Time: 177 seconds

## 2022-12-29 LAB — GLUCOSE, CAPILLARY: Glucose-Capillary: 119 mg/dL — ABNORMAL HIGH (ref 70–99)

## 2022-12-29 SURGERY — ABDOMINAL AORTOGRAM W/LOWER EXTREMITY
Anesthesia: LOCAL

## 2022-12-29 MED ORDER — FENTANYL CITRATE (PF) 100 MCG/2ML IJ SOLN
INTRAMUSCULAR | Status: AC
  Filled 2022-12-29: qty 2

## 2022-12-29 MED ORDER — MIDAZOLAM HCL 2 MG/2ML IJ SOLN
INTRAMUSCULAR | Status: AC
  Filled 2022-12-29: qty 2

## 2022-12-29 MED ORDER — ACETAMINOPHEN 325 MG PO TABS: 650.0000 mg | ORAL_TABLET | ORAL | Status: DC | PRN

## 2022-12-29 MED ORDER — FENTANYL CITRATE (PF) 100 MCG/2ML IJ SOLN
INTRAMUSCULAR | Status: DC | PRN
  Administered 2022-12-29 (×3): 50 ug via INTRAVENOUS

## 2022-12-29 MED ORDER — SODIUM CHLORIDE 0.9% FLUSH: 3.0000 mL | Freq: Two times a day (BID) | INTRAVENOUS | Status: DC

## 2022-12-29 MED ORDER — ONDANSETRON HCL 4 MG/2ML IJ SOLN
INTRAMUSCULAR | Status: AC
  Filled 2022-12-29: qty 2

## 2022-12-29 MED ORDER — MORPHINE SULFATE (PF) 2 MG/ML IV SOLN: 2.0000 mg | INTRAVENOUS | Status: DC | PRN

## 2022-12-29 MED ORDER — CLOPIDOGREL BISULFATE 75 MG PO TABS: 75.0000 mg | ORAL_TABLET | Freq: Every day | ORAL | 11 refills | Status: DC

## 2022-12-29 MED ORDER — ONDANSETRON HCL 4 MG/2ML IJ SOLN
INTRAMUSCULAR | Status: DC | PRN
  Administered 2022-12-29: 4 mg via INTRAVENOUS

## 2022-12-29 MED ORDER — HYDRALAZINE HCL 20 MG/ML IJ SOLN: 5.0000 mg | INTRAMUSCULAR | Status: DC | PRN

## 2022-12-29 MED ORDER — CLOPIDOGREL BISULFATE 300 MG PO TABS
ORAL_TABLET | ORAL | Status: AC
  Filled 2022-12-29: qty 1

## 2022-12-29 MED ORDER — SODIUM CHLORIDE 0.9% FLUSH: 3.0000 mL | INTRAVENOUS | Status: DC | PRN

## 2022-12-29 MED ORDER — LIDOCAINE HCL (PF) 1 % IJ SOLN
INTRAMUSCULAR | Status: AC
  Filled 2022-12-29: qty 30

## 2022-12-29 MED ORDER — HEPARIN SODIUM (PORCINE) 1000 UNIT/ML IJ SOLN
INTRAMUSCULAR | Status: DC | PRN
  Administered 2022-12-29: 10000 [IU] via INTRAVENOUS
  Administered 2022-12-29: 4000 [IU] via INTRAVENOUS

## 2022-12-29 MED ORDER — HEPARIN (PORCINE) IN NACL 1000-0.9 UT/500ML-% IV SOLN
INTRAVENOUS | Status: DC | PRN
  Administered 2022-12-29 (×2): 500 mL

## 2022-12-29 MED ORDER — SODIUM CHLORIDE 0.9 % IV SOLN: 250.0000 mL | INTRAVENOUS | Status: DC | PRN

## 2022-12-29 MED ORDER — HEPARIN SODIUM (PORCINE) 1000 UNIT/ML IJ SOLN
INTRAMUSCULAR | Status: AC
  Filled 2022-12-29: qty 10

## 2022-12-29 MED ORDER — CLOPIDOGREL BISULFATE 75 MG PO TABS
300.0000 mg | ORAL_TABLET | Freq: Once | ORAL | Status: AC
  Administered 2022-12-29: 300 mg via ORAL

## 2022-12-29 MED ORDER — MIDAZOLAM HCL 2 MG/2ML IJ SOLN
INTRAMUSCULAR | Status: DC | PRN
  Administered 2022-12-29 (×2): 1 mg via INTRAVENOUS

## 2022-12-29 MED ORDER — LIDOCAINE HCL (PF) 1 % IJ SOLN
INTRAMUSCULAR | Status: DC | PRN
  Administered 2022-12-29: 15 mL

## 2022-12-29 MED ORDER — ONDANSETRON HCL 4 MG/2ML IJ SOLN
INTRAMUSCULAR | Status: AC
  Administered 2022-12-29: 4 mg via INTRAVENOUS
  Filled 2022-12-29: qty 2

## 2022-12-29 MED ORDER — CLOPIDOGREL BISULFATE 75 MG PO TABS: 75.0000 mg | ORAL_TABLET | Freq: Every day | ORAL | Status: DC

## 2022-12-29 MED ORDER — OXYCODONE HCL 5 MG PO TABS: 5.0000 mg | ORAL_TABLET | ORAL | Status: DC | PRN

## 2022-12-29 MED ORDER — LABETALOL HCL 5 MG/ML IV SOLN: 10.0000 mg | INTRAVENOUS | Status: DC | PRN

## 2022-12-29 MED ORDER — ONDANSETRON HCL 4 MG/2ML IJ SOLN: 4.0000 mg | Freq: Four times a day (QID) | INTRAMUSCULAR | Status: DC | PRN

## 2022-12-29 SURGICAL SUPPLY — 27 items
BALLN STERLING OTW 5X220X150 (BALLOONS) ×2
BALLOON STERLING OTW 5X220X150 (BALLOONS) IMPLANT
CATH AURYON ATHERECTOMY 2.0 (CATHETERS) IMPLANT
CATH OMNI FLUSH 5F 65CM (CATHETERS) IMPLANT
CATH QUICKCROSS .035X135CM (MICROCATHETER) IMPLANT
DCB RANGER 5.0X200 150 (BALLOONS) IMPLANT
GLIDEWIRE ADV .035X260CM (WIRE) IMPLANT
GUIDEWIRE ANGLED .035X150CM (WIRE) IMPLANT
GUIDEWIRE NITREX 0.018X80X5 (WIRE) IMPLANT
KIT ENCORE 26 ADVANTAGE (KITS) IMPLANT
KIT MICROPUNCTURE NIT STIFF (SHEATH) IMPLANT
KIT PV (KITS) ×2 IMPLANT
RANGER DCB 5.0X200 150 (BALLOONS) ×2
SHEATH CATAPULT 7F 45 MP (SHEATH) IMPLANT
SHEATH PINNACLE 5F 10CM (SHEATH) IMPLANT
SHEATH PINNACLE 7F 10CM (SHEATH) IMPLANT
SHEATH PROBE COVER 6X72 (BAG) IMPLANT
STENT ELUVIA 6X150X130 (Permanent Stent) IMPLANT
STENT ELUVIA 6X80X130 (Permanent Stent) IMPLANT
STOPCOCK MORSE 400PSI 3WAY (MISCELLANEOUS) IMPLANT
SYR MEDRAD MARK 7 150ML (SYRINGE) ×2 IMPLANT
TRANSDUCER W/STOPCOCK (MISCELLANEOUS) ×2 IMPLANT
TRAY PV CATH (CUSTOM PROCEDURE TRAY) ×2 IMPLANT
TUBING CIL FLEX 10 FLL-RA (TUBING) IMPLANT
WIRE BENTSON .035X145CM (WIRE) IMPLANT
WIRE SHEPHERD 4G .018 (WIRE) IMPLANT
WIRE SPARTACORE .014X300CM (WIRE) IMPLANT

## 2022-12-29 NOTE — Interval H&P Note (Signed)
History and Physical Interval Note:  12/29/2022 7:22 AM  [name] [name]  has presented today for surgery, with the diagnosis of Atherosclerosis of native artery of left lower extremity with gangrene.  The various methods of treatment have been discussed with the patient and family. After consideration of risks, benefits and other options for treatment, the patient has consented to  Procedure(s): ABDOMINAL AORTOGRAM W/LOWER EXTREMITY (N/A) as a surgical intervention.  The patient's history has been reviewed, patient examined, no change in status, stable for surgery.  I have reviewed the patient's chart and labs.  Questions were answered to the patient's satisfaction.     Lemar Livings

## 2022-12-29 NOTE — Progress Notes (Signed)
Site area: right femoral artery Site Prior to Removal:  Level 0 Pressure Applied For: 40 minutes Manual:   yes Patient Status During Pull:  stable vital signs, paitent had nausea and vomiting. Patient also had nausea and vomiting in procedure room prior to arrival in the recovery room. Zofran administered Post Pull Site:  Level 0 Post Pull Instructions Given:  yes Post Pull Pulses Present: Doppler right DP Dressing Applied:  sterile tegaderm and gauze Bedrest begins @ 12:10 Comments:

## 2022-12-29 NOTE — Op Note (Signed)
Patient name: [name] [name] MRN: [id number] DOB: [birth year] Sex: male  12/29/2022 Pre-operative Diagnosis: Chronic left lower extremity limb threatening ischemia with toe ulceration Post-operative diagnosis:  Same Surgeon:  [name] [name] C. Randie Heinz, MD Procedure Performed: 1.  Percutaneous access with ultrasound guidance right common femoral artery 2.  Aortogram with bilateral lower extremity angiography 3.  Catheter selection left popliteal artery 4.  Laser athrectomy left SFA and popliteal arteries with 2.0 mm [place] 5.  Stent left SFA and popliteal arteries with 6 x 150 mm Eluvia x 2 and 6 x 80 mm Eluvia proximally 6.  Moderate sedation with fentanyl and Versed for 90 minutes   Indications: 67 year old male with history of end-stage renal disease on dialysis Mondays and Fridays.  He has few week history of left great toe gangrene after minor trauma.  He has been on antibiotics followed podiatry.  He is now indicated for angiography with possible intervention.  Findings: Aorta is patent bilateral renal arteries actually filled despite patient being on dialysis.  Bilateral common iliac arteries appear hazy although at completion there was no arterial gradient across either of these.  Bilateral common femoral arteries are patent the right SFA is highly stenosed proximally for a long segment approximately 80% this is below our cannulation site.  On the right side there is disease from the takeoff of the SFA all the way to the below-knee popliteal artery.  Below the knee the popliteal artery is patent there is dominant runoff via the peroneal artery and also runoff via the posterior tibial artery although this is stenosis at the takeoff.  The anterior tibial artery occludes at the takeoff and does not reconstitute to the level of the foot.  After treatment of the left SFA and popliteal arteries there is 0% residual stenosis with brisk flow to the foot with strong posterior tibial and peroneal signals at the  ankle.   Procedure:  The patient was identified in the holding area and taken to room 8.  The patient was then placed supine on the table and prepped and draped in the usual sterile fashion.  A time out was called.  Ultrasound was used to evaluate the right common femoral artery.  This area was diseased.  Ultrasound was used to directly cannulate with a micropuncture needle followed by wire and sheath and images saved department record.  We placed a Bentson wire followed by 5 [place] sheath and Omni catheter to the level of L1 and aortogram was performed follow-up bilateral lower extremity runoff.  Concomitantly we use moderate sedation with fentanyl and Versed his vital signs were monitored throughout the case.  With the above findings crossed the bifurcation perform left lower extremity angiography and then placed a Glidewire advantage over the bifurcation a long 7 French sheath was placed in the left common femoral artery patient was fully heparinized.  Glidewire advantage and quick cross cath were used to select the below-knee popliteal artery and angiogram was performed from there is an 014 wire was placed.  Then performed laser arthrectomy with 2.0 mm catheter and then primary balloon angioplasty with 5 mm balloon.  I initially performed distal stenting with a 6 mm Eluvia proximal drug-coated balloon angioplasty with 5 mm balloon but unfortunate proximally there was a dissection we extended with 2 additional stents a 6 x 150 mm Eluvia and a 6 x 80 mm linear dissected entire SFA from the adductor canal higher and these were postdilated with a drug-coated balloon which was 5.  Completion demonstrated  no residual stenosis.  I then did a pullback gradient from the common femoral artery there was no gradient to the level of the aorta and the left common iliac artery for extrailiac artery and no gradient on the right from the aorta back to the right external neck artery.  We then exchanged over a Bentson wire for a  short 7 French sheath and the sheath will be pulled in postoperative holding.  He tolerated the procedure without any complication.   Contrast: 155cc  [name] [name] C. Randie Heinz, MD [facility] [place] Office: [phone] Pager: [phone]

## 2022-12-30 ENCOUNTER — Encounter (HOSPITAL_COMMUNITY): Payer: Self-pay | Admitting: Vascular Surgery

## 2022-12-30 LAB — POCT ACTIVATED CLOTTING TIME: Activated Clotting Time: 220 seconds

## 2022-12-30 MED FILL — Clopidogrel Bisulfate Tab 300 MG (Base Equiv): ORAL | Qty: 1 | Status: AC

## 2023-01-01 DIAGNOSIS — M869 Osteomyelitis, unspecified: Secondary | ICD-10-CM | POA: Diagnosis not present

## 2023-01-01 DIAGNOSIS — I96 Gangrene, not elsewhere classified: Secondary | ICD-10-CM | POA: Diagnosis not present

## 2023-01-01 DIAGNOSIS — E1151 Type 2 diabetes mellitus with diabetic peripheral angiopathy without gangrene: Secondary | ICD-10-CM | POA: Diagnosis not present

## 2023-01-02 DIAGNOSIS — N186 End stage renal disease: Secondary | ICD-10-CM | POA: Diagnosis not present

## 2023-01-02 DIAGNOSIS — N2581 Secondary hyperparathyroidism of renal origin: Secondary | ICD-10-CM | POA: Diagnosis not present

## 2023-01-02 DIAGNOSIS — Z992 Dependence on renal dialysis: Secondary | ICD-10-CM | POA: Diagnosis not present

## 2023-01-04 ENCOUNTER — Other Ambulatory Visit: Payer: Self-pay | Admitting: Cardiology

## 2023-01-04 DIAGNOSIS — I480 Paroxysmal atrial fibrillation: Secondary | ICD-10-CM

## 2023-01-05 DIAGNOSIS — N186 End stage renal disease: Secondary | ICD-10-CM | POA: Diagnosis not present

## 2023-01-05 DIAGNOSIS — N2581 Secondary hyperparathyroidism of renal origin: Secondary | ICD-10-CM | POA: Diagnosis not present

## 2023-01-05 DIAGNOSIS — Z992 Dependence on renal dialysis: Secondary | ICD-10-CM | POA: Diagnosis not present

## 2023-01-05 NOTE — Telephone Encounter (Signed)
Prescription refill request for Eliquis received. Indication:afib Last office visit:2/24 Scr:6.4  7/24 Age: 67 Weight:117.9  kg  Prescription refilled

## 2023-01-06 DIAGNOSIS — I10 Essential (primary) hypertension: Secondary | ICD-10-CM | POA: Diagnosis not present

## 2023-01-06 DIAGNOSIS — M86172 Other acute osteomyelitis, left ankle and foot: Secondary | ICD-10-CM | POA: Diagnosis not present

## 2023-01-06 DIAGNOSIS — J449 Chronic obstructive pulmonary disease, unspecified: Secondary | ICD-10-CM | POA: Diagnosis not present

## 2023-01-06 DIAGNOSIS — N186 End stage renal disease: Secondary | ICD-10-CM | POA: Diagnosis not present

## 2023-01-06 DIAGNOSIS — Z951 Presence of aortocoronary bypass graft: Secondary | ICD-10-CM | POA: Diagnosis not present

## 2023-01-06 DIAGNOSIS — E1152 Type 2 diabetes mellitus with diabetic peripheral angiopathy with gangrene: Secondary | ICD-10-CM | POA: Diagnosis not present

## 2023-01-06 DIAGNOSIS — M869 Osteomyelitis, unspecified: Secondary | ICD-10-CM | POA: Diagnosis not present

## 2023-01-06 DIAGNOSIS — I5020 Unspecified systolic (congestive) heart failure: Secondary | ICD-10-CM | POA: Diagnosis not present

## 2023-01-06 DIAGNOSIS — I96 Gangrene, not elsewhere classified: Secondary | ICD-10-CM | POA: Diagnosis not present

## 2023-01-06 DIAGNOSIS — I120 Hypertensive chronic kidney disease with stage 5 chronic kidney disease or end stage renal disease: Secondary | ICD-10-CM | POA: Diagnosis not present

## 2023-01-06 DIAGNOSIS — E1122 Type 2 diabetes mellitus with diabetic chronic kidney disease: Secondary | ICD-10-CM | POA: Diagnosis not present

## 2023-01-06 DIAGNOSIS — F17210 Nicotine dependence, cigarettes, uncomplicated: Secondary | ICD-10-CM | POA: Diagnosis not present

## 2023-01-09 DIAGNOSIS — N2581 Secondary hyperparathyroidism of renal origin: Secondary | ICD-10-CM | POA: Diagnosis not present

## 2023-01-09 DIAGNOSIS — Z992 Dependence on renal dialysis: Secondary | ICD-10-CM | POA: Diagnosis not present

## 2023-01-09 DIAGNOSIS — N186 End stage renal disease: Secondary | ICD-10-CM | POA: Diagnosis not present

## 2023-01-12 DIAGNOSIS — N186 End stage renal disease: Secondary | ICD-10-CM | POA: Diagnosis not present

## 2023-01-12 DIAGNOSIS — N2581 Secondary hyperparathyroidism of renal origin: Secondary | ICD-10-CM | POA: Diagnosis not present

## 2023-01-12 DIAGNOSIS — Z992 Dependence on renal dialysis: Secondary | ICD-10-CM | POA: Diagnosis not present

## 2023-01-15 DIAGNOSIS — I739 Peripheral vascular disease, unspecified: Secondary | ICD-10-CM | POA: Diagnosis not present

## 2023-01-15 DIAGNOSIS — B999 Unspecified infectious disease: Secondary | ICD-10-CM | POA: Diagnosis not present

## 2023-01-15 DIAGNOSIS — L0390 Cellulitis, unspecified: Secondary | ICD-10-CM | POA: Diagnosis not present

## 2023-01-15 DIAGNOSIS — T8130XA Disruption of wound, unspecified, initial encounter: Secondary | ICD-10-CM | POA: Diagnosis not present

## 2023-01-15 DIAGNOSIS — N186 End stage renal disease: Secondary | ICD-10-CM | POA: Diagnosis not present

## 2023-01-15 DIAGNOSIS — Z992 Dependence on renal dialysis: Secondary | ICD-10-CM | POA: Diagnosis not present

## 2023-01-15 DIAGNOSIS — T82858A Stenosis of vascular prosthetic devices, implants and grafts, initial encounter: Secondary | ICD-10-CM | POA: Diagnosis not present

## 2023-01-15 DIAGNOSIS — Z8639 Personal history of other endocrine, nutritional and metabolic disease: Secondary | ICD-10-CM | POA: Diagnosis not present

## 2023-01-15 DIAGNOSIS — I871 Compression of vein: Secondary | ICD-10-CM | POA: Diagnosis not present

## 2023-01-15 DIAGNOSIS — Z89412 Acquired absence of left great toe: Secondary | ICD-10-CM | POA: Diagnosis not present

## 2023-01-16 ENCOUNTER — Telehealth: Payer: Self-pay

## 2023-01-16 DIAGNOSIS — N2581 Secondary hyperparathyroidism of renal origin: Secondary | ICD-10-CM | POA: Diagnosis not present

## 2023-01-16 DIAGNOSIS — N186 End stage renal disease: Secondary | ICD-10-CM | POA: Diagnosis not present

## 2023-01-16 DIAGNOSIS — Z992 Dependence on renal dialysis: Secondary | ICD-10-CM | POA: Diagnosis not present

## 2023-01-16 NOTE — Telephone Encounter (Signed)
Post op appointment moved up per Dr. Bennye Alm request

## 2023-01-16 NOTE — Telephone Encounter (Signed)
Caller: Dr. Marylene Land with [facility]  Concern: gangrene of great toe, subsequent amp that is not healing, there is a secondary infection that she is treating, but she is requesting that his appts moved earlier for concern of inadequate blood flow  Location: left leg  Procedure: Interventional Vascular Procedure  Resolution:  Appts moved earlier  Next Appt: Appointment scheduled for 7/30

## 2023-01-19 ENCOUNTER — Other Ambulatory Visit: Payer: Self-pay | Admitting: *Deleted

## 2023-01-19 DIAGNOSIS — Z992 Dependence on renal dialysis: Secondary | ICD-10-CM | POA: Diagnosis not present

## 2023-01-19 DIAGNOSIS — N186 End stage renal disease: Secondary | ICD-10-CM | POA: Diagnosis not present

## 2023-01-19 DIAGNOSIS — I70262 Atherosclerosis of native arteries of extremities with gangrene, left leg: Secondary | ICD-10-CM

## 2023-01-19 DIAGNOSIS — N2581 Secondary hyperparathyroidism of renal origin: Secondary | ICD-10-CM | POA: Diagnosis not present

## 2023-01-20 ENCOUNTER — Encounter: Payer: Self-pay | Admitting: Cardiology

## 2023-01-20 ENCOUNTER — Ambulatory Visit: Payer: Medicare HMO

## 2023-01-20 ENCOUNTER — Ambulatory Visit (INDEPENDENT_AMBULATORY_CARE_PROVIDER_SITE_OTHER): Payer: Medicare HMO | Admitting: Cardiology

## 2023-01-20 ENCOUNTER — Encounter: Payer: Self-pay | Admitting: Vascular Surgery

## 2023-01-20 ENCOUNTER — Other Ambulatory Visit: Payer: Self-pay

## 2023-01-20 ENCOUNTER — Ambulatory Visit
Admission: RE | Admit: 2023-01-20 | Discharge: 2023-01-20 | Disposition: A | Discharge status: Home / Self Care | Payer: Medicare HMO | Source: Ambulatory Visit | Attending: Vascular Surgery | Admitting: Vascular Surgery

## 2023-01-20 ENCOUNTER — Ambulatory Visit (INDEPENDENT_AMBULATORY_CARE_PROVIDER_SITE_OTHER)
Admission: RE | Admit: 2023-01-20 | Discharge: 2023-01-20 | Disposition: A | Discharge status: Home / Self Care | Payer: Medicare HMO | Source: Ambulatory Visit | Attending: Vascular Surgery | Admitting: Vascular Surgery

## 2023-01-20 VITALS — BP 105/83 | HR 68 | Temp 98.6°F | Resp 18 | Ht 70.0 in | Wt 253.7 lb

## 2023-01-20 VITALS — BP 130/76 | HR 74 | Ht 70.0 in | Wt 253.7 lb

## 2023-01-20 DIAGNOSIS — I2510 Atherosclerotic heart disease of native coronary artery without angina pectoris: Secondary | ICD-10-CM

## 2023-01-20 DIAGNOSIS — I5022 Chronic systolic (congestive) heart failure: Secondary | ICD-10-CM

## 2023-01-20 DIAGNOSIS — I70262 Atherosclerosis of native arteries of extremities with gangrene, left leg: Secondary | ICD-10-CM

## 2023-01-20 DIAGNOSIS — I10 Essential (primary) hypertension: Secondary | ICD-10-CM | POA: Insufficient documentation

## 2023-01-20 DIAGNOSIS — I4819 Other persistent atrial fibrillation: Secondary | ICD-10-CM | POA: Insufficient documentation

## 2023-01-20 DIAGNOSIS — D6869 Other thrombophilia: Secondary | ICD-10-CM | POA: Insufficient documentation

## 2023-01-20 DIAGNOSIS — G4733 Obstructive sleep apnea (adult) (pediatric): Secondary | ICD-10-CM | POA: Diagnosis not present

## 2023-01-20 LAB — VAS US ABI WITH/WO TBI: Left ABI: 0.89

## 2023-01-20 MED ORDER — SODIUM CHLORIDE 0.9% FLUSH: 3.0000 mL | Freq: Two times a day (BID) | INTRAVENOUS | Status: DC

## 2023-01-20 MED ORDER — SODIUM CHLORIDE 0.9 % IV SOLN: 250.0000 mL | INTRAVENOUS | Status: DC | PRN

## 2023-01-20 NOTE — Progress Notes (Signed)
HISTORY AND PHYSICAL     CC:  follow up. Requesting Provider:  [name], PA-C  HPI: This is a 67 y.o. male who is here today for follow up for PAD.  Pt has hx of aortogram with laser atherectomy and stenting left SFA and popliteal arteries on 12/29/2022 by Dr. Randie Heinz for chronic LLE threatening ischemia with toe ulceration.   He is followed by Dr. Marylene Land for new left great toe gangrene, which occurred from low-grade trauma.  She recently reached out to have pt's post op appt moved up due to the toe not healing and having secondary infection.   He comes in today with concerns of non healing left great toe amputation.  He states he had the amputation the day after his angiogram.  He recently saw Dr. Marylene Land, who marked his foot and if the redness progressed, he was instructed to proceed to the emergency room  He states his fistula is working well.  He just had a stent placed in it last week at [facility]. He states he gets occasional numbness in his hand.   Pt has hx of CAD with hx of CABG 11/12/2021, ESRD, CHF, DM, OSA, PAF.   He collects salt and pepper shakers.   Dialysis access procedures: -Right BC AVF 05/20/2022 Dr. Arbie Cookey -revision and superficialization right arm AVF 07/10/2022 Dr. Randie Heinz   The pt is on a statin for cholesterol management.    The pt is not on an aspirin.    Other AC:  Plavix/Eliquis The pt is on BB, diuretic for hypertension.  The pt is on medication for diabetes. Tobacco hx:  former    Past Medical History:  Diagnosis Date   Anemia 04/17/2022   Blood transfusion   Atherosclerotic heart disease    Benign essential HTN 07/30/2014   Bradycardia 01/25/2015   CHF (congestive heart failure) (HCC)    Chronic anticoagulation 02/25/2018   Chronic systolic CHF (congestive heart failure), NYHA class 3 (HCC) 08/30/2014   Overview:  Global ef 30%   CKD (chronic kidney disease)    Class 3 severe obesity in adult (HCC) 04/28/2017   Coronary artery disease     Diabetes mellitus without complication (HCC)    Diabetic neuropathy (HCC) 07/28/2014   Dysrhythmia    Erectile dysfunction    High risk medication use 05/26/2018   Hypertensive heart disease with heart failure (HCC) 07/30/2014   Leukocytosis 11/26/2021   Lumbago    LV dysfunction 04/28/2017   Mixed hyperlipidemia 07/28/2014   OSA (obstructive sleep apnea)    CPAP   Paroxysmal atrial fibrillation (HCC) 07/28/2014   Postop check 01/06/2022   S/P CABG x 3 11/12/2021   Sinus node dysfunction (HCC) 04/21/2016   Testicular hypofunction    Uncontrolled type 2 diabetes mellitus with microalbuminuric diabetic nephropathy 07/28/2014    Past Surgical History:  Procedure Laterality Date   ABDOMINAL AORTOGRAM W/LOWER EXTREMITY N/A 12/29/2022   Procedure: ABDOMINAL AORTOGRAM W/LOWER EXTREMITY;  Surgeon: Maeola Harman, MD;  Location: [facility] INVASIVE CV LAB;  Service: Cardiovascular;  Laterality: N/A;   AV FISTULA PLACEMENT Right 05/20/2022   Procedure: RIGHT ARM ARTERIOVENOUS (AV) FISTULA CREATION;  Surgeon: Larina Earthly, MD;  Location: [facility] OR;  Service: Vascular;  Laterality: Right;   BUBBLE STUDY  11/21/2021   Procedure: BUBBLE STUDY;  Surgeon: Laurey Morale, MD;  Location: [facility] ENDOSCOPY;  Service: Cardiovascular;;   CARDIAC CATHETERIZATION     CARDIOVERSION N/A 11/21/2021   Procedure: CARDIOVERSION;  Surgeon: Laurey Morale,  MD;  Location: [facility] ENDOSCOPY;  Service: Cardiovascular;  Laterality: N/A;   CARDIOVERSION N/A 11/25/2021   Procedure: CARDIOVERSION;  Surgeon: Laurey Morale, MD;  Location: [facility] ENDOSCOPY;  Service: Cardiovascular;  Laterality: N/A;   CARDIOVERSION N/A 11/29/2021   Procedure: CARDIOVERSION;  Surgeon: Laurey Morale, MD;  Location: [facility] ENDOSCOPY;  Service: Cardiovascular;  Laterality: N/A;   CARDIOVERSION N/A 02/27/2022   Procedure: CARDIOVERSION;  Surgeon: Little Ishikawa, MD;  Location: [facility] ENDOSCOPY;  Service: Cardiovascular;  Laterality: N/A;    CORONARY ARTERY BYPASS GRAFT N/A 11/12/2021   Procedure: CORONARY ARTERY BYPASS GRAFTING (CABG) x  3 USING LEFT INTERNAL MAMMARY ARTERY AND RIGHT GREATER SAPHENOUS VEIN;  Surgeon: Lovett Sox, MD;  Location: [facility] OR;  Service: Open Heart Surgery;  Laterality: N/A;   ELECTROPHYSIOLOGIC STUDY N/A 01/18/2015   Procedure: CARDIOVERSION;  Surgeon: Lamar Blinks, MD;  Location: [facility] ORS;  Service: Cardiovascular;  Laterality: N/A;   ENDOVEIN HARVEST OF GREATER SAPHENOUS VEIN  11/12/2021   Procedure: ENDOVEIN HARVEST OF GREATER SAPHENOUS VEIN;  Surgeon: Lovett Sox, MD;  Location: [facility] OR;  Service: Open Heart Surgery;;   EYE SURGERY Bilateral 2022   FISTULA SUPERFICIALIZATION Right 07/10/2022   Procedure: RIGHT ARM ARTERIOVENOUS FISTULA SUPERFICIALIZATION;  Surgeon: Maeola Harman, MD;  Location: [facility] OR;  Service: Vascular;  Laterality: Right;   IR FLUORO GUIDE CV LINE RIGHT  11/28/2021   IR US GUIDE VASC ACCESS RIGHT  11/28/2021   LEFT HEART CATH AND CORONARY ANGIOGRAPHY N/A 10/24/2021   Procedure: LEFT HEART CATH AND CORONARY ANGIOGRAPHY;  Surgeon: Corky Crafts, MD;  Location: [facility] INVASIVE CV LAB;  Service: Cardiovascular;  Laterality: N/A;   MAZE N/A 11/12/2021   Procedure: MAZE;  Surgeon: Lovett Sox, MD;  Location: [facility] OR;  Service: Open Heart Surgery;  Laterality: N/A;   PERIPHERAL VASCULAR ATHERECTOMY  12/29/2022   Procedure: PERIPHERAL VASCULAR ATHERECTOMY;  Surgeon: Maeola Harman, MD;  Location: [facility] INVASIVE CV LAB;  Service: Cardiovascular;;   PERIPHERAL VASCULAR INTERVENTION  12/29/2022   Procedure: PERIPHERAL VASCULAR INTERVENTION;  Surgeon: Maeola Harman, MD;  Location: [facility] INVASIVE CV LAB;  Service: Cardiovascular;;  Left SFA, Bilateral illiac   TEE WITHOUT CARDIOVERSION N/A 11/12/2021   Procedure: TRANSESOPHAGEAL ECHOCARDIOGRAM (TEE);  Surgeon: Lovett Sox, MD;  Location: [facility] OR;  Service: Open Heart Surgery;  Laterality: N/A;   TEE WITHOUT  CARDIOVERSION N/A 11/21/2021   Procedure: TRANSESOPHAGEAL ECHOCARDIOGRAM (TEE);  Surgeon: Laurey Morale, MD;  Location: [facility] ENDOSCOPY;  Service: Cardiovascular;  Laterality: N/A;    No Known Allergies  Current Outpatient Medications  Medication Sig Dispense Refill   albuterol (VENTOLIN HFA) 108 (90 Base) MCG/ACT inhaler Inhale 2 puffs into the lungs every 6 (six) hours as needed for wheezing or shortness of breath.     amiodarone (PACERONE) 200 MG tablet Take 1 tablet (200 mg total) by mouth 2 (two) times daily. 180 tablet 3   atorvastatin (LIPITOR) 80 MG tablet Take 1 tablet (80 mg total) by mouth daily. 90 tablet 2   B Complex-C-Folic Acid (DIALYVITE 800) 0.8 MG TABS Take 0.8 mg by mouth daily.     clopidogrel (PLAVIX) 75 MG tablet Take 1 tablet (75 mg total) by mouth daily. 30 tablet 11   Dulaglutide (TRULICITY) 3 MG/0.5ML SOPN Inject 3 mg into the skin every Wednesday.     ELIQUIS 5 MG TABS tablet TAKE 1 TABLET TWICE DAILY 180 tablet 3   fluticasone furoate-vilanterol (BREO ELLIPTA) 200-25 MCG/ACT AEPB Inhale 1 puff into  the lungs daily.  3   insulin glargine (LANTUS) 100 UNIT/ML injection Inject 38 Units into the skin 2 (two) times daily.     Insulin Pen Needle 32G X 4 MM MISC Use to inject Levemir 2 (two) times daily. 100 each 1   LEVEMIR FLEXPEN 100 UNIT/ML FlexPen INJECT 30 UNITS UNDER THE SKIN TWICE DAILY 60 mL 3   Methoxy PEG-Epoetin Beta (MIRCERA IJ) Mircera     metoprolol succinate (TOPROL-XL) 25 MG 24 hr tablet Take 25 mg by mouth daily.     midodrine (PROAMATINE) 5 MG tablet Take 1 tablet (5 mg total) by mouth daily. 90 tablet 3   sulfamethoxazole-trimethoprim (BACTRIM DS) 800-160 MG tablet Take 1 tablet by mouth 2 (two) times daily.     torsemide (DEMADEX) 20 MG tablet Take 1 tablet (20 mg total) by mouth daily. 90 tablet 1   traMADol (ULTRAM) 50 MG tablet Take 1 tablet (50 mg total) by mouth every 8 (eight) hours as needed. 15 tablet 0   Current Facility-Administered  Medications  Medication Dose Route Frequency Provider Last Rate Last Admin   0.9 %  sodium chloride infusion  250 mL Intravenous PRN Maeola Harman, MD       sodium chloride flush (NS) 0.9 % injection 3 mL  3 mL Intravenous Q12H Maeola Harman, MD        Family History  Problem Relation Age of Onset   CAD Brother    Drug abuse Brother    Lung cancer Father    CAD Mother     Social History   Socioeconomic History   Marital status: Married    Spouse name: Not on file   Number of children: Not on file   Years of education: Not on file   Highest education level: Not on file  Occupational History   Not on file  Tobacco Use   Smoking status: Former    Current packs/day: 0.00    Types: Cigarettes    Quit date: 2007    Years since quitting: 17.5   Smokeless tobacco: Never  Vaping Use   Vaping status: Never Used  Substance and Sexual Activity   Alcohol use: No   Drug use: Not Currently    Frequency: 1.0 times per week    Types: Marijuana   Sexual activity: Not on file  Other Topics Concern   Not on file  Social History Narrative   Not on file   Social Determinants of Health   Financial Resource Strain: Not on file  Food Insecurity: Not on file  Transportation Needs: Not on file  Physical Activity: Not on file  Stress: Not on file  Social Connections: Not on file  Intimate Partner Violence: Not on file     REVIEW OF SYSTEMS:   [X]  denotes positive finding, [ ]  denotes negative finding Cardiac  Comments:  Chest pain or chest pressure:    Shortness of breath upon exertion:    Short of breath when lying flat:    Irregular heart rhythm:        Vascular    Pain in calf, thigh, or hip brought on by ambulation:    Pain in feet at night that wakes you up from your sleep:     Blood clot in your veins:    Leg swelling:         Pulmonary    Oxygen at home:    Productive cough:     Wheezing:  Neurologic    Sudden weakness in arms or  legs:     Sudden numbness in arms or legs:     Sudden onset of difficulty speaking or slurred speech:    Temporary loss of vision in one eye:     Problems with dizziness:         Gastrointestinal    Blood in stool:     Vomited blood:         Genitourinary    Burning when urinating:     Blood in urine:        Psychiatric    Major depression:         Hematologic    Bleeding problems:    Problems with blood clotting too easily:        Skin    Rashes or ulcers:        Constitutional    Fever or chills:      PHYSICAL EXAMINATION:  Today's Vitals   01/20/23 1127  BP: 105/83  Pulse: 68  Resp: 18  Temp: 98.6 F (37 C)  TempSrc: Temporal  SpO2: 96%  Weight: 253 lb 11.2 oz (115.1 kg)  Height: 5\' 10"  (1.778 m)   Body mass index is 36.4 kg/m.   General:  WDWN in NAD; vital signs documented above Gait: Not observed HENT: WNL, normocephalic Pulmonary: normal non-labored breathing , without wheezing Cardiac: regular HR, without carotid bruits Abdomen: obese Skin: without rashes Vascular Exam/Pulses: Brisk biphasic left peroneal>PT; faint left AT doppler signal Right monophasic DP and peroneal doppler signals.  Extremities: with ischemic changes and non healing toe amputation     Musculoskeletal: no muscle wasting or atrophy  Neurologic: A&O X 3 Psychiatric:  The pt has Normal affect.   Non-Invasive Vascular Imaging:   ABI's/TBI's on 01/20/2023: Right:  0.51/0.29 - Great toe pressure: 44 Left:  0.89/amp - Great toe pressure: amp  Arterial duplex on 01/20/2023: +-----------+--------+-----+---------------+----------+--------+  LEFT      PSV cm/sRatioStenosis       Waveform  Comments  +-----------+--------+-----+---------------+----------+--------+  CFA Prox   290          50-74% stenosistriphasic           +-----------+--------+-----+---------------+----------+--------+  DFA       194                         biphasic             +-----------+--------+-----+---------------+----------+--------+  SFA Prox   188                         monophasic          +-----------+--------+-----+---------------+----------+--------+  POP Prox   162                         monophasic          +-----------+--------+-----+---------------+----------+--------+  POP Mid    171                         monophasic          +-----------+--------+-----+---------------+----------+--------+  POP Distal 151                         monophasic          +-----------+--------+-----+---------------+----------+--------+  ATA Distal  occluded                           +-----------+--------+-----+---------------+----------+--------+  PTA Distal 111                         monophasic          +-----------+--------+-----+---------------+----------+--------+  PERO Distal34                          monophasic          +-----------+--------+-----+---------------+----------+--------+   Unable to well visualize stent in popliteal artery.    Left Stent(s):  +---------------+--------+---------------+----------+--------+  SFA           PSV cm/sStenosis       Waveform  Comments  +---------------+--------+---------------+----------+--------+  Prox to Stent  139                    monophasic          +---------------+--------+---------------+----------+--------+  Proximal Stent 144                    monophasic          +---------------+--------+---------------+----------+--------+  Mid Stent      165                    monophasic          +---------------+--------+---------------+----------+--------+  Distal Stent   146                    monophasic          +---------------+--------+---------------+----------+--------+  Distal to Stent211     50-99% stenosismonophasic          +---------------+--------+---------------+----------+--------+   Summary:  Left:  50-74% stenosis noted in the common femoral artery. Total occlusion noted in the anterior tibial artery. Elevated velocities distal to stent, otherwise SFA stent appears patent. Unable to well visualize stent struts in popliteal artery, however artery appears patent      ASSESSMENT/PLAN:: 67 y.o. male here for follow up for PAD with hx of aortogram with laser atherectomy and stenting left SFA and popliteal arteries on 12/29/2022 by Dr. Randie Heinz for chronic LLE threatening ischemia with toe ulceration.    -pt non healing left great toe amputation.   Duplex today reveals his stents are patent.  There is a mild stenosis distal to the stent.   Pt seen with Dr. Chestine Spore and he discussed that the foot is close to being non salvageable.  Dr. Chestine Spore felt we could repeat angiogram on Thursday for possible retrograde access of the left AT.  Pt has appt with Dr. Marylene Land on Thursday afternoon and does not want to cancel this appt.  I discussed with pt that he is at very high risk for proximal amputation.  We could not proceed tomorrow as pt is on Eliquis.  He has dialysis on M/F.  Pt and wife understand he is at high risk and we recommend proceeding with angiography as soon as possible. -continue asa/statin/plavix    Doreatha Massed, [facility] [phone]  Clinic MD:   Chestine Spore

## 2023-01-20 NOTE — Progress Notes (Signed)
Pt scheduled for abdominal aortogram.  Pt last dose of Eliquis: 01/20/23 AM  Pt last dose of Trulicity: 01/14/23  Pt informed of all medication holds, he and his wife confirmed understanding.

## 2023-01-20 NOTE — Progress Notes (Signed)
  Electrophysiology Office Note:   Date:  01/20/2023  ID:  [name] [name], DOB 1956-02-12, MRN [id number]  Primary Cardiologist: None Electrophysiologist: Duncan Alejandro Jorja Loa, MD      History of Present Illness:   [name] [name] is a 67 y.o. male with h/o atrial fibrillation seen today for routine electrophysiology followup.  Since last being seen in our clinic the patient reports doing well from a cardiac perspective.  He has been having vascular issues.  He recently had a toe ulceration and is post amputation.  He also had stenting of his left SFA and popliteal arteries.  He is hoping that he does not have to have his foot amputated.  he denies chest pain, palpitations, dyspnea, PND, orthopnea, nausea, vomiting, dizziness, syncope, edema, weight gain, or early satiety.     He has a history significant for atrial fibrillation, prior cardiomyopathy with normalization of his ejection fraction, CKD, sick sinus syndrome. He was found to be in atrial fibrillation 05/09/2021. He is initially scheduled for ablation but his preablation CT showed elevated coronary calcium score. He had a left heart catheterization that showed severe three-vessel disease and is now status post CABG/surgical maze. He had a recent cardioversion 02/27/2022. Postop from his CABG, he had acute renal failure and is now on dialysis.       Review of systems complete and found to be negative unless listed in HPI.   EP Information / Studies Reviewed:    EKG is not ordered today. EKG from 12/29/22 reviewed which showed sinus rhythm        Risk Assessment/Calculations:    CHA2DS2-VASc Score = 5   This indicates a 7.2% annual risk of stroke. The patient's score is based upon: CHF History: 1 HTN History: 1 Diabetes History: 1 Stroke History: 0 Vascular Disease History: 1 Age Score: 1 Gender Score: 0             Physical Exam:   VS:  BP 130/76 (BP Location: Left Arm, Patient Position: Sitting, Cuff Size: Large)   Pulse  74   Ht 5\' 10"  (1.778 m)   Wt 253 lb 11.2 oz (115.1 kg)   SpO2 96%   BMI 36.40 kg/m    Wt Readings from Last 3 Encounters:  01/20/23 253 lb 11.2 oz (115.1 kg)  01/20/23 253 lb 11.2 oz (115.1 kg)  12/29/22 260 lb (117.9 kg)     GEN: Well nourished, well developed in no acute distress NECK: No JVD; No carotid bruits CARDIAC: Regular rate and rhythm, no murmurs, rubs, gallops RESPIRATORY:  Clear to auscultation without rales, wheezing or rhonchi  ABDOMEN: Soft, non-tender, non-distended EXTREMITIES:  No edema; No deformity   ASSESSMENT AND PLAN:    1.  Persistent atrial fibrillation/flutter: Currently on Eliquis and amiodarone.  Remains in sinus rhythm.  [name] [name] continue with current management.  2.  End-stage renal disease: Currently on dialysis.  He is hoping he [name] [name] may be able to get off dialysis.  He has been going to dialysis 2 days instead of 3 days/week.  3.  Hypertension: Currently well-controlled  4.  Secondary hypercoagulable state: Currently on Eliquis for atrial fibrillation  5.  Obstructive sleep apnea: CPAP compliance encouraged  6.  Coronary artery disease: Status post CABG.  Plan per primary cardiology  7.  Peripheral arterial disease: Managed by vascular surgery  Follow up with Afib Clinic in 6 months  Signed, Minerva Bluett Jorja Loa, MD

## 2023-01-20 NOTE — Patient Instructions (Signed)
Medication Instructions:  Your physician recommends that you continue on your current medications as directed. Please refer to the Current Medication list given to you today.  *If you need a refill on your cardiac medications before your next appointment, please call your pharmacy*   Lab Work: None ordered   Testing/Procedures: None ordered   Follow-Up: At [facility], you and your health needs are our priority.  As part of our continuing mission to provide you with exceptional heart care, we have created designated Provider Care Teams.  These Care Teams include your primary Cardiologist (physician) and Advanced Practice Providers (APPs -  Physician Assistants and Nurse Practitioners) who all work together to provide you with the care you need, when you need it.  We recommend signing up for the patient portal called "MyChart".  Sign up information is provided on this After Visit Summary.  MyChart is used to connect with patients for Virtual Visits (Telemedicine).  Patients are able to view lab/test results, encounter notes, upcoming appointments, etc.  Non-urgent messages can be sent to your provider as well.   To learn more about what you can do with MyChart, go to [email or web address].    Your next appointment:   6 month(s)  The format for your next appointment:   In Person  Provider:   You will follow up in the Atrial Fibrillation Clinic located at [facility]. Your provider will be: Clint R. Fenton, PA-C  Or  Landry Mellow, PA  Thank you for choosing [facility]!!   Dory Horn, RN [phone]

## 2023-01-21 DIAGNOSIS — N179 Acute kidney failure, unspecified: Secondary | ICD-10-CM | POA: Diagnosis not present

## 2023-01-21 DIAGNOSIS — N186 End stage renal disease: Secondary | ICD-10-CM | POA: Diagnosis not present

## 2023-01-21 DIAGNOSIS — Z992 Dependence on renal dialysis: Secondary | ICD-10-CM | POA: Diagnosis not present

## 2023-01-22 ENCOUNTER — Encounter (HOSPITAL_COMMUNITY)
Admission: RE | Disposition: A | Discharge status: Home / Self Care | Payer: Self-pay | Source: Home / Self Care | Attending: Vascular Surgery

## 2023-01-22 ENCOUNTER — Ambulatory Visit (HOSPITAL_COMMUNITY)
Admission: RE | Admit: 2023-01-22 | Discharge: 2023-01-22 | Disposition: A | Discharge status: Home / Self Care | Payer: Medicare HMO | Attending: Vascular Surgery | Admitting: Vascular Surgery

## 2023-01-22 DIAGNOSIS — E11621 Type 2 diabetes mellitus with foot ulcer: Secondary | ICD-10-CM | POA: Diagnosis not present

## 2023-01-22 DIAGNOSIS — I5022 Chronic systolic (congestive) heart failure: Secondary | ICD-10-CM | POA: Insufficient documentation

## 2023-01-22 DIAGNOSIS — Z7901 Long term (current) use of anticoagulants: Secondary | ICD-10-CM | POA: Diagnosis not present

## 2023-01-22 DIAGNOSIS — E1151 Type 2 diabetes mellitus with diabetic peripheral angiopathy without gangrene: Secondary | ICD-10-CM | POA: Insufficient documentation

## 2023-01-22 DIAGNOSIS — I998 Other disorder of circulatory system: Secondary | ICD-10-CM

## 2023-01-22 DIAGNOSIS — Z87891 Personal history of nicotine dependence: Secondary | ICD-10-CM | POA: Insufficient documentation

## 2023-01-22 DIAGNOSIS — I70245 Atherosclerosis of native arteries of left leg with ulceration of other part of foot: Secondary | ICD-10-CM | POA: Diagnosis not present

## 2023-01-22 DIAGNOSIS — Z794 Long term (current) use of insulin: Secondary | ICD-10-CM | POA: Insufficient documentation

## 2023-01-22 DIAGNOSIS — I480 Paroxysmal atrial fibrillation: Secondary | ICD-10-CM | POA: Diagnosis not present

## 2023-01-22 DIAGNOSIS — Z951 Presence of aortocoronary bypass graft: Secondary | ICD-10-CM | POA: Insufficient documentation

## 2023-01-22 DIAGNOSIS — E1122 Type 2 diabetes mellitus with diabetic chronic kidney disease: Secondary | ICD-10-CM | POA: Insufficient documentation

## 2023-01-22 DIAGNOSIS — Z7985 Long-term (current) use of injectable non-insulin antidiabetic drugs: Secondary | ICD-10-CM | POA: Diagnosis not present

## 2023-01-22 DIAGNOSIS — G4733 Obstructive sleep apnea (adult) (pediatric): Secondary | ICD-10-CM | POA: Insufficient documentation

## 2023-01-22 DIAGNOSIS — L97529 Non-pressure chronic ulcer of other part of left foot with unspecified severity: Secondary | ICD-10-CM | POA: Insufficient documentation

## 2023-01-22 DIAGNOSIS — Z79899 Other long term (current) drug therapy: Secondary | ICD-10-CM | POA: Insufficient documentation

## 2023-01-22 DIAGNOSIS — N186 End stage renal disease: Secondary | ICD-10-CM | POA: Insufficient documentation

## 2023-01-22 DIAGNOSIS — Z89412 Acquired absence of left great toe: Secondary | ICD-10-CM | POA: Diagnosis not present

## 2023-01-22 DIAGNOSIS — I2510 Atherosclerotic heart disease of native coronary artery without angina pectoris: Secondary | ICD-10-CM | POA: Insufficient documentation

## 2023-01-22 DIAGNOSIS — I132 Hypertensive heart and chronic kidney disease with heart failure and with stage 5 chronic kidney disease, or end stage renal disease: Secondary | ICD-10-CM | POA: Insufficient documentation

## 2023-01-22 DIAGNOSIS — E782 Mixed hyperlipidemia: Secondary | ICD-10-CM | POA: Diagnosis not present

## 2023-01-22 DIAGNOSIS — Z7902 Long term (current) use of antithrombotics/antiplatelets: Secondary | ICD-10-CM | POA: Diagnosis not present

## 2023-01-22 DIAGNOSIS — I70262 Atherosclerosis of native arteries of extremities with gangrene, left leg: Secondary | ICD-10-CM

## 2023-01-22 HISTORY — PX: ABDOMINAL AORTOGRAM W/LOWER EXTREMITY: CATH118223

## 2023-01-22 LAB — POCT I-STAT, CHEM 8
BUN: 81 mg/dL — ABNORMAL HIGH (ref 8–23)
Calcium, Ion: 0.99 mmol/L — ABNORMAL LOW (ref 1.15–1.40)
Chloride: 102 mmol/L (ref 98–111)
Creatinine, Ser: 5.8 mg/dL — ABNORMAL HIGH (ref 0.61–1.24)
Glucose, Bld: 111 mg/dL — ABNORMAL HIGH (ref 70–99)
HCT: 27 % — ABNORMAL LOW (ref 39.0–52.0)
Hemoglobin: 9.2 g/dL — ABNORMAL LOW (ref 13.0–17.0)
Potassium: 4.7 mmol/L (ref 3.5–5.1)
Sodium: 136 mmol/L (ref 135–145)
TCO2: 27 mmol/L (ref 22–32)

## 2023-01-22 LAB — GLUCOSE, CAPILLARY: Glucose-Capillary: 75 mg/dL (ref 70–99)

## 2023-01-22 SURGERY — ABDOMINAL AORTOGRAM W/LOWER EXTREMITY
Anesthesia: LOCAL

## 2023-01-22 MED ORDER — FENTANYL CITRATE (PF) 100 MCG/2ML IJ SOLN
INTRAMUSCULAR | Status: AC
  Filled 2023-01-22: qty 2

## 2023-01-22 MED ORDER — HEPARIN (PORCINE) IN NACL 1000-0.9 UT/500ML-% IV SOLN
INTRAVENOUS | Status: DC | PRN
  Administered 2023-01-22 (×2): 500 mL

## 2023-01-22 MED ORDER — SODIUM CHLORIDE 0.9% FLUSH: 3.0000 mL | Freq: Two times a day (BID) | INTRAVENOUS | Status: DC

## 2023-01-22 MED ORDER — ACETAMINOPHEN 325 MG PO TABS: 650.0000 mg | ORAL_TABLET | ORAL | Status: DC | PRN

## 2023-01-22 MED ORDER — FENTANYL CITRATE (PF) 100 MCG/2ML IJ SOLN
INTRAMUSCULAR | Status: DC | PRN
  Administered 2023-01-22: 25 ug via INTRAVENOUS

## 2023-01-22 MED ORDER — ONDANSETRON HCL 4 MG/2ML IJ SOLN: 4.0000 mg | Freq: Four times a day (QID) | INTRAMUSCULAR | Status: DC | PRN

## 2023-01-22 MED ORDER — HEPARIN SODIUM (PORCINE) 1000 UNIT/ML IJ SOLN
INTRAMUSCULAR | Status: DC | PRN
  Administered 2023-01-22: 10000 [IU] via INTRAVENOUS

## 2023-01-22 MED ORDER — MIDAZOLAM HCL 2 MG/2ML IJ SOLN
INTRAMUSCULAR | Status: AC
  Filled 2023-01-22: qty 2

## 2023-01-22 MED ORDER — LIDOCAINE HCL (PF) 1 % IJ SOLN
INTRAMUSCULAR | Status: DC | PRN
  Administered 2023-01-22: 15 mL
  Administered 2023-01-22: 2 mL

## 2023-01-22 MED ORDER — SODIUM CHLORIDE 0.9 % IV SOLN: 250.0000 mL | INTRAVENOUS | Status: DC | PRN

## 2023-01-22 MED ORDER — LABETALOL HCL 5 MG/ML IV SOLN: 10.0000 mg | INTRAVENOUS | Status: DC | PRN

## 2023-01-22 MED ORDER — IODIXANOL 320 MG/ML IV SOLN
INTRAVENOUS | Status: DC | PRN
  Administered 2023-01-22: 94 mL

## 2023-01-22 MED ORDER — SODIUM CHLORIDE 0.9% FLUSH: 3.0000 mL | INTRAVENOUS | Status: DC | PRN

## 2023-01-22 MED ORDER — MIDAZOLAM HCL 2 MG/2ML IJ SOLN
INTRAMUSCULAR | Status: DC | PRN
  Administered 2023-01-22: 1 mg via INTRAVENOUS

## 2023-01-22 MED ORDER — HYDRALAZINE HCL 20 MG/ML IJ SOLN: 5.0000 mg | INTRAMUSCULAR | Status: DC | PRN

## 2023-01-22 MED ORDER — LIDOCAINE HCL (PF) 1 % IJ SOLN
INTRAMUSCULAR | Status: AC
  Filled 2023-01-22: qty 30

## 2023-01-22 SURGICAL SUPPLY — 19 items
CATH CXI 2.6F 65 ANG (CATHETERS) ×1
CATH CXI 2.6F 65 ST (CATHETERS) ×1
CATH OMNI FLUSH 5F 65CM (CATHETERS) IMPLANT
CATH QUICKCROSS SUPP .035X90CM (MICROCATHETER) IMPLANT
CATH SPRT ANG 65X2.3FR PLATN (CATHETERS) IMPLANT
CATH SPRT STRG 65X2.6FR ACPT (CATHETERS) IMPLANT
DEVICE CLOSURE MYNXGRIP 5F (Vascular Products) IMPLANT
GUIDEWIRE ANGLED .035X260CM (WIRE) IMPLANT
KIT MICROPUNCTURE NIT STIFF (SHEATH) IMPLANT
PROTECTION STATION PRESSURIZED (MISCELLANEOUS) ×1
SET ATX-X65L (MISCELLANEOUS) IMPLANT
SHEATH GLIDE SLENDER 4/5FR (SHEATH) IMPLANT
SHEATH MICROPUNCTURE PEDAL 4FR (SHEATH) IMPLANT
SHEATH PINNACLE 5F 10CM (SHEATH) IMPLANT
SHEATH PROBE COVER 6X72 (BAG) IMPLANT
STATION PROTECTION PRESSURIZED (MISCELLANEOUS) IMPLANT
TRAY PV CATH (CUSTOM PROCEDURE TRAY) ×1 IMPLANT
WIRE BENTSON .035X145CM (WIRE) IMPLANT
WIRE G V18X300CM (WIRE) IMPLANT

## 2023-01-22 NOTE — H&P (Signed)
History and Physical Interval Note:  01/22/2023 2:32 PM  [name] [name]  has presented today for surgery, with the diagnosis of ischemia left lower leg.  The various methods of treatment have been discussed with the patient and family. After consideration of risks, benefits and other options for treatment, the patient has consented to  Procedure(s): ABDOMINAL AORTOGRAM W/LOWER EXTREMITY (N/A) as a surgical intervention.  The patient's history has been reviewed, patient examined, no change in status, stable for surgery.  I have reviewed the patient's chart and labs.  Questions were answered to the patient's satisfaction.    Progressive tissue loss after recent angiogram with Dr. Randie Heinz.  I discussed that he is ultimately heading toward a major amputation.  He wants to continue talking with Dr. Marylene Land.  Will evaluate runoff and see if there is any other option like retrograde tibial to get his AT open  [name] [name]   HISTORY AND PHYSICAL        CC:  follow up. Requesting Provider:  [name], PA-C   HPI: This is a 67 y.o. male who is here today for follow up for PAD.  Pt has hx of aortogram with laser atherectomy and stenting left SFA and popliteal arteries on 12/29/2022 by Dr. Randie Heinz for chronic LLE threatening ischemia with toe ulceration.    He is followed by Dr. Marylene Land for new left great toe gangrene, which occurred from low-grade trauma.  She recently reached out to have pt's post op appt moved up due to the toe not healing and having secondary infection.    He comes in today with concerns of non healing left great toe amputation.  He states he had the amputation the day after his angiogram.  He recently saw Dr. Marylene Land, who marked his foot and if the redness progressed, he was instructed to proceed to the emergency room   He states his fistula is working well.  He just had a stent placed in it last week at [facility]. He states he gets occasional numbness in his hand.    Pt has hx  of CAD with hx of CABG 11/12/2021, ESRD, CHF, DM, OSA, PAF.    He collects salt and pepper shakers.    Dialysis access procedures: -Right BC AVF 05/20/2022 Dr. Arbie Cookey -revision and superficialization right arm AVF 07/10/2022 Dr. Randie Heinz     The pt is on a statin for cholesterol management.    The pt is not on an aspirin.    Other AC:  Plavix/Eliquis The pt is on BB, diuretic for hypertension.  The pt is on medication for diabetes. Tobacco hx:  former           Past Medical History:  Diagnosis Date   Anemia 04/17/2022    Blood transfusion   Atherosclerotic heart disease     Benign essential HTN 07/30/2014   Bradycardia 01/25/2015   CHF (congestive heart failure) (HCC)     Chronic anticoagulation 02/25/2018   Chronic systolic CHF (congestive heart failure), NYHA class 3 (HCC) 08/30/2014    Overview:  Global ef 30%   CKD (chronic kidney disease)     Class 3 severe obesity in adult [facility]) 04/28/2017   Coronary artery disease     Diabetes mellitus without complication (HCC)     Diabetic neuropathy (HCC) 07/28/2014   Dysrhythmia     Erectile dysfunction     High risk medication use 05/26/2018   Hypertensive heart disease with heart failure (HCC) 07/30/2014   Leukocytosis 11/26/2021  Lumbago     LV dysfunction 04/28/2017   Mixed hyperlipidemia 07/28/2014   OSA (obstructive sleep apnea)      CPAP   Paroxysmal atrial fibrillation (HCC) 07/28/2014   Postop check 01/06/2022   S/P CABG x 3 11/12/2021   Sinus node dysfunction (HCC) 04/21/2016   Testicular hypofunction     Uncontrolled type 2 diabetes mellitus with microalbuminuric diabetic nephropathy 07/28/2014               Past Surgical History:  Procedure Laterality Date   ABDOMINAL AORTOGRAM W/LOWER EXTREMITY N/A 12/29/2022    Procedure: ABDOMINAL AORTOGRAM W/LOWER EXTREMITY;  Surgeon: Maeola Harman, MD;  Location: [facility] INVASIVE CV LAB;  Service: Cardiovascular;  Laterality: N/A;   AV FISTULA PLACEMENT Right  05/20/2022    Procedure: RIGHT ARM ARTERIOVENOUS (AV) FISTULA CREATION;  Surgeon: Larina Earthly, MD;  Location: [facility] OR;  Service: Vascular;  Laterality: Right;   BUBBLE STUDY   11/21/2021    Procedure: BUBBLE STUDY;  Surgeon: Laurey Morale, MD;  Location: [facility] ENDOSCOPY;  Service: Cardiovascular;;   CARDIAC CATHETERIZATION       CARDIOVERSION N/A 11/21/2021    Procedure: CARDIOVERSION;  Surgeon: Laurey Morale, MD;  Location: [facility] ENDOSCOPY;  Service: Cardiovascular;  Laterality: N/A;   CARDIOVERSION N/A 11/25/2021    Procedure: CARDIOVERSION;  Surgeon: Laurey Morale, MD;  Location: [facility] ENDOSCOPY;  Service: Cardiovascular;  Laterality: N/A;   CARDIOVERSION N/A 11/29/2021    Procedure: CARDIOVERSION;  Surgeon: Laurey Morale, MD;  Location: [facility] ENDOSCOPY;  Service: Cardiovascular;  Laterality: N/A;   CARDIOVERSION N/A 02/27/2022    Procedure: CARDIOVERSION;  Surgeon: Little Ishikawa, MD;  Location: [facility] ENDOSCOPY;  Service: Cardiovascular;  Laterality: N/A;   CORONARY ARTERY BYPASS GRAFT N/A 11/12/2021    Procedure: CORONARY ARTERY BYPASS GRAFTING (CABG) x  3 USING LEFT INTERNAL MAMMARY ARTERY AND RIGHT GREATER SAPHENOUS VEIN;  Surgeon: Lovett Sox, MD;  Location: [facility] OR;  Service: Open Heart Surgery;  Laterality: N/A;   ELECTROPHYSIOLOGIC STUDY N/A 01/18/2015    Procedure: CARDIOVERSION;  Surgeon: Lamar Blinks, MD;  Location: [facility] ORS;  Service: Cardiovascular;  Laterality: N/A;   ENDOVEIN HARVEST OF GREATER SAPHENOUS VEIN   11/12/2021    Procedure: ENDOVEIN HARVEST OF GREATER SAPHENOUS VEIN;  Surgeon: Lovett Sox, MD;  Location: [facility] OR;  Service: Open Heart Surgery;;   EYE SURGERY Bilateral 2022   FISTULA SUPERFICIALIZATION Right 07/10/2022    Procedure: RIGHT ARM ARTERIOVENOUS FISTULA SUPERFICIALIZATION;  Surgeon: Maeola Harman, MD;  Location: [facility] OR;  Service: Vascular;  Laterality: Right;   IR FLUORO GUIDE CV LINE RIGHT   11/28/2021   IR US GUIDE VASC ACCESS  RIGHT   11/28/2021   LEFT HEART CATH AND CORONARY ANGIOGRAPHY N/A 10/24/2021    Procedure: LEFT HEART CATH AND CORONARY ANGIOGRAPHY;  Surgeon: Corky Crafts, MD;  Location: [facility] INVASIVE CV LAB;  Service: Cardiovascular;  Laterality: N/A;   MAZE N/A 11/12/2021    Procedure: MAZE;  Surgeon: Lovett Sox, MD;  Location: [facility] OR;  Service: Open Heart Surgery;  Laterality: N/A;   PERIPHERAL VASCULAR ATHERECTOMY   12/29/2022    Procedure: PERIPHERAL VASCULAR ATHERECTOMY;  Surgeon: Maeola Harman, MD;  Location: [facility] INVASIVE CV LAB;  Service: Cardiovascular;;   PERIPHERAL VASCULAR INTERVENTION   12/29/2022    Procedure: PERIPHERAL VASCULAR INTERVENTION;  Surgeon: Maeola Harman, MD;  Location: [facility] INVASIVE CV LAB;  Service: Cardiovascular;;  Left SFA, Bilateral illiac   TEE WITHOUT CARDIOVERSION  N/A 11/12/2021    Procedure: TRANSESOPHAGEAL ECHOCARDIOGRAM (TEE);  Surgeon: Lovett Sox, MD;  Location: [facility] OR;  Service: Open Heart Surgery;  Laterality: N/A;   TEE WITHOUT CARDIOVERSION N/A 11/21/2021    Procedure: TRANSESOPHAGEAL ECHOCARDIOGRAM (TEE);  Surgeon: Laurey Morale, MD;  Location: [facility] ENDOSCOPY;  Service: Cardiovascular;  Laterality: N/A;          Allergies  No Known Allergies           Current Outpatient Medications  Medication Sig Dispense Refill   albuterol (VENTOLIN HFA) 108 (90 Base) MCG/ACT inhaler Inhale 2 puffs into the lungs every 6 (six) hours as needed for wheezing or shortness of breath.       amiodarone (PACERONE) 200 MG tablet Take 1 tablet (200 mg total) by mouth 2 (two) times daily. 180 tablet 3   atorvastatin (LIPITOR) 80 MG tablet Take 1 tablet (80 mg total) by mouth daily. 90 tablet 2   B Complex-C-Folic Acid (DIALYVITE 800) 0.8 MG TABS Take 0.8 mg by mouth daily.       clopidogrel (PLAVIX) 75 MG tablet Take 1 tablet (75 mg total) by mouth daily. 30 tablet 11   Dulaglutide (TRULICITY) 3 MG/0.5ML SOPN Inject 3 mg into the skin every Wednesday.        ELIQUIS 5 MG TABS tablet TAKE 1 TABLET TWICE DAILY 180 tablet 3   fluticasone furoate-vilanterol (BREO ELLIPTA) 200-25 MCG/ACT AEPB Inhale 1 puff into the lungs daily.   3   insulin glargine (LANTUS) 100 UNIT/ML injection Inject 38 Units into the skin 2 (two) times daily.       Insulin Pen Needle 32G X 4 MM MISC Use to inject Levemir 2 (two) times daily. 100 each 1   LEVEMIR FLEXPEN 100 UNIT/ML FlexPen INJECT 30 UNITS UNDER THE SKIN TWICE DAILY 60 mL 3   Methoxy PEG-Epoetin Beta (MIRCERA IJ) Mircera       metoprolol succinate (TOPROL-XL) 25 MG 24 hr tablet Take 25 mg by mouth daily.       midodrine (PROAMATINE) 5 MG tablet Take 1 tablet (5 mg total) by mouth daily. 90 tablet 3   sulfamethoxazole-trimethoprim (BACTRIM DS) 800-160 MG tablet Take 1 tablet by mouth 2 (two) times daily.       torsemide (DEMADEX) 20 MG tablet Take 1 tablet (20 mg total) by mouth daily. 90 tablet 1   traMADol (ULTRAM) 50 MG tablet Take 1 tablet (50 mg total) by mouth every 8 (eight) hours as needed. 15 tablet 0               Current Facility-Administered Medications  Medication Dose Route Frequency Provider Last Rate Last Admin   0.9 %  sodium chloride infusion  250 mL Intravenous PRN Maeola Harman, MD       sodium chloride flush (NS) 0.9 % injection 3 mL  3 mL Intravenous Q12H Maeola Harman, MD                 Family History  Problem Relation Age of Onset   CAD Brother     Drug abuse Brother     Lung cancer Father     CAD Mother            Social History         Socioeconomic History   Marital status: Married      Spouse name: Not on file   Number of children: Not on file   Years of education: Not on file  Highest education level: Not on file  Occupational History   Not on file  Tobacco Use   Smoking status: Former      Current packs/day: 0.00      Types: Cigarettes      Quit date: 2007      Years since quitting: 17.5   Smokeless tobacco: Never  Vaping Use    Vaping status: Never Used  Substance and Sexual Activity   Alcohol use: No   Drug use: Not Currently      Frequency: 1.0 times per week      Types: Marijuana   Sexual activity: Not on file  Other Topics Concern   Not on file  Social History Narrative   Not on file    Social Determinants of Health    Financial Resource Strain: Not on file  Food Insecurity: Not on file  Transportation Needs: Not on file  Physical Activity: Not on file  Stress: Not on file  Social Connections: Not on file  Intimate Partner Violence: Not on file        REVIEW OF SYSTEMS:    [X]  denotes positive finding, [ ]  denotes negative finding Cardiac   Comments:  Chest pain or chest pressure:      Shortness of breath upon exertion:      Short of breath when lying flat:      Irregular heart rhythm:             Vascular      Pain in calf, thigh, or hip brought on by ambulation:      Pain in feet at night that wakes you up from your sleep:       Blood clot in your veins:      Leg swelling:              Pulmonary      Oxygen at home:      Productive cough:       Wheezing:              Neurologic      Sudden weakness in arms or legs:       Sudden numbness in arms or legs:       Sudden onset of difficulty speaking or slurred speech:      Temporary loss of vision in one eye:       Problems with dizziness:              Gastrointestinal      Blood in stool:       Vomited blood:              Genitourinary      Burning when urinating:       Blood in urine:             Psychiatric      Major depression:              Hematologic      Bleeding problems:      Problems with blood clotting too easily:             Skin      Rashes or ulcers:             Constitutional      Fever or chills:          PHYSICAL EXAMINATION:      Today's Vitals    01/20/23 1127  BP: 105/83  Pulse: 68  Resp: 18  Temp:  98.6 F (37 C)  TempSrc: Temporal  SpO2: 96%  Weight: 253 lb 11.2 oz (115.1 kg)   Height: 5\' 10"  (1.778 m)    Body mass index is 36.4 kg/m.     General:  WDWN in NAD; vital signs documented above Gait: Not observed HENT: WNL, normocephalic Pulmonary: normal non-labored breathing , without wheezing Cardiac: regular HR, without carotid bruits Abdomen: obese Skin: without rashes Vascular Exam/Pulses: Brisk biphasic left peroneal>PT; faint left AT doppler signal Right monophasic DP and peroneal doppler signals.  Extremities: with ischemic changes and non healing toe amputation       Musculoskeletal: no muscle wasting or atrophy       Neurologic: A&O X 3 Psychiatric:  The pt has Normal affect.     Non-Invasive Vascular Imaging:   ABI's/TBI's on 01/20/2023: Right:  0.51/0.29 - Great toe pressure: 44 Left:  0.89/amp - Great toe pressure: amp   Arterial duplex on 01/20/2023: +-----------+--------+-----+---------------+----------+--------+  LEFT      PSV cm/sRatioStenosis       Waveform  Comments  +-----------+--------+-----+---------------+----------+--------+  CFA Prox   290          50-74% stenosistriphasic           +-----------+--------+-----+---------------+----------+--------+  DFA       194                         biphasic            +-----------+--------+-----+---------------+----------+--------+  SFA Prox   188                         monophasic          +-----------+--------+-----+---------------+----------+--------+  POP Prox   162                         monophasic          +-----------+--------+-----+---------------+----------+--------+  POP Mid    171                         monophasic          +-----------+--------+-----+---------------+----------+--------+  POP Distal 151                         monophasic          +-----------+--------+-----+---------------+----------+--------+  ATA Distal              occluded                            +-----------+--------+-----+---------------+----------+--------+  PTA Distal 111                         monophasic          +-----------+--------+-----+---------------+----------+--------+  PERO Distal34                          monophasic          +-----------+--------+-----+---------------+----------+--------+   Unable to well visualize stent in popliteal artery.    Left Stent(s):  +---------------+--------+---------------+----------+--------+  SFA           PSV cm/sStenosis       Waveform  Comments  +---------------+--------+---------------+----------+--------+  Prox to Stent  139  monophasic          +---------------+--------+---------------+----------+--------+  Proximal Stent 144                    monophasic          +---------------+--------+---------------+----------+--------+  Mid Stent      165                    monophasic          +---------------+--------+---------------+----------+--------+  Distal Stent   146                    monophasic          +---------------+--------+---------------+----------+--------+  Distal to Stent211     50-99% stenosismonophasic          +---------------+--------+---------------+----------+--------+   Summary:  Left: 50-74% stenosis noted in the common femoral artery. Total occlusion noted in the anterior tibial artery. Elevated velocities distal to stent, otherwise SFA stent appears patent. Unable to well visualize stent struts in popliteal artery, however artery appears patent          ASSESSMENT/PLAN:: 67 y.o. male here for follow up for PAD with hx of aortogram with laser atherectomy and stenting left SFA and popliteal arteries on 12/29/2022 by Dr. Randie Heinz for chronic LLE threatening ischemia with toe ulceration.      -pt non healing left great toe amputation.   Duplex today reveals his stents are patent.  There is a mild stenosis distal to the stent.   Pt seen with Dr.  Chestine Spore and he discussed that the foot is close to being non salvageable.  Dr. Chestine Spore felt we could repeat angiogram on Thursday for possible retrograde access of the left AT.  Pt has appt with Dr. Marylene Land on Thursday afternoon and does not want to cancel this appt.  I discussed with pt that he is at very high risk for proximal amputation.  We could not proceed tomorrow as pt is on Eliquis.  He has dialysis on M/F.  Pt and wife understand he is at high risk and we recommend proceeding with angiography as soon as possible. -continue asa/statin/plavix       Doreatha Massed, [facility] [phone]   Clinic MD:   Chestine Spore

## 2023-01-22 NOTE — H&P (View-Only) (Signed)
History and Physical Interval Note:  01/22/2023 2:32 PM  [name] [name]  has presented today for surgery, with the diagnosis of ischemia left lower leg.  The various methods of treatment have been discussed with the patient and family. After consideration of risks, benefits and other options for treatment, the patient has consented to  Procedure(s): ABDOMINAL AORTOGRAM W/LOWER EXTREMITY (N/A) as a surgical intervention.  The patient's history has been reviewed, patient examined, no change in status, stable for surgery.  I have reviewed the patient's chart and labs.  Questions were answered to the patient's satisfaction.    Progressive tissue loss after recent angiogram with Dr. Randie Heinz.  I discussed that he is ultimately heading toward a major amputation.  He wants to continue talking with Dr. Marylene Land.  Will evaluate runoff and see if there is any other option like retrograde tibial to get his AT open  [name] [name]   HISTORY AND PHYSICAL        CC:  follow up. Requesting Provider:  [name], PA-C   HPI: This is a 67 y.o. male who is here today for follow up for PAD.  Pt has hx of aortogram with laser atherectomy and stenting left SFA and popliteal arteries on 12/29/2022 by Dr. Randie Heinz for chronic LLE threatening ischemia with toe ulceration.    He is followed by Dr. Marylene Land for new left great toe gangrene, which occurred from low-grade trauma.  She recently reached out to have pt's post op appt moved up due to the toe not healing and having secondary infection.    He comes in today with concerns of non healing left great toe amputation.  He states he had the amputation the day after his angiogram.  He recently saw Dr. Marylene Land, who marked his foot and if the redness progressed, he was instructed to proceed to the emergency room   He states his fistula is working well.  He just had a stent placed in it last week at [facility]. He states he gets occasional numbness in his hand.    Pt has hx  of CAD with hx of CABG 11/12/2021, ESRD, CHF, DM, OSA, PAF.    He collects salt and pepper shakers.    Dialysis access procedures: -Right BC AVF 05/20/2022 Dr. Arbie Cookey -revision and superficialization right arm AVF 07/10/2022 Dr. Randie Heinz     The pt is on a statin for cholesterol management.    The pt is not on an aspirin.    Other AC:  Plavix/Eliquis The pt is on BB, diuretic for hypertension.  The pt is on medication for diabetes. Tobacco hx:  former           Past Medical History:  Diagnosis Date   Anemia 04/17/2022    Blood transfusion   Atherosclerotic heart disease     Benign essential HTN 07/30/2014   Bradycardia 01/25/2015   CHF (congestive heart failure) (HCC)     Chronic anticoagulation 02/25/2018   Chronic systolic CHF (congestive heart failure), NYHA class 3 (HCC) 08/30/2014    Overview:  Global ef 30%   CKD (chronic kidney disease)     Class 3 severe obesity in adult [facility]) 04/28/2017   Coronary artery disease     Diabetes mellitus without complication (HCC)     Diabetic neuropathy (HCC) 07/28/2014   Dysrhythmia     Erectile dysfunction     High risk medication use 05/26/2018   Hypertensive heart disease with heart failure (HCC) 07/30/2014   Leukocytosis 11/26/2021  Lumbago     LV dysfunction 04/28/2017   Mixed hyperlipidemia 07/28/2014   OSA (obstructive sleep apnea)      CPAP   Paroxysmal atrial fibrillation (HCC) 07/28/2014   Postop check 01/06/2022   S/P CABG x 3 11/12/2021   Sinus node dysfunction (HCC) 04/21/2016   Testicular hypofunction     Uncontrolled type 2 diabetes mellitus with microalbuminuric diabetic nephropathy 07/28/2014               Past Surgical History:  Procedure Laterality Date   ABDOMINAL AORTOGRAM W/LOWER EXTREMITY N/A 12/29/2022    Procedure: ABDOMINAL AORTOGRAM W/LOWER EXTREMITY;  Surgeon: Maeola Harman, MD;  Location: [facility] INVASIVE CV LAB;  Service: Cardiovascular;  Laterality: N/A;   AV FISTULA PLACEMENT Right  05/20/2022    Procedure: RIGHT ARM ARTERIOVENOUS (AV) FISTULA CREATION;  Surgeon: Larina Earthly, MD;  Location: [facility] OR;  Service: Vascular;  Laterality: Right;   BUBBLE STUDY   11/21/2021    Procedure: BUBBLE STUDY;  Surgeon: Laurey Morale, MD;  Location: [facility] ENDOSCOPY;  Service: Cardiovascular;;   CARDIAC CATHETERIZATION       CARDIOVERSION N/A 11/21/2021    Procedure: CARDIOVERSION;  Surgeon: Laurey Morale, MD;  Location: [facility] ENDOSCOPY;  Service: Cardiovascular;  Laterality: N/A;   CARDIOVERSION N/A 11/25/2021    Procedure: CARDIOVERSION;  Surgeon: Laurey Morale, MD;  Location: [facility] ENDOSCOPY;  Service: Cardiovascular;  Laterality: N/A;   CARDIOVERSION N/A 11/29/2021    Procedure: CARDIOVERSION;  Surgeon: Laurey Morale, MD;  Location: [facility] ENDOSCOPY;  Service: Cardiovascular;  Laterality: N/A;   CARDIOVERSION N/A 02/27/2022    Procedure: CARDIOVERSION;  Surgeon: Little Ishikawa, MD;  Location: [facility] ENDOSCOPY;  Service: Cardiovascular;  Laterality: N/A;   CORONARY ARTERY BYPASS GRAFT N/A 11/12/2021    Procedure: CORONARY ARTERY BYPASS GRAFTING (CABG) x  3 USING LEFT INTERNAL MAMMARY ARTERY AND RIGHT GREATER SAPHENOUS VEIN;  Surgeon: Lovett Sox, MD;  Location: [facility] OR;  Service: Open Heart Surgery;  Laterality: N/A;   ELECTROPHYSIOLOGIC STUDY N/A 01/18/2015    Procedure: CARDIOVERSION;  Surgeon: Lamar Blinks, MD;  Location: [facility] ORS;  Service: Cardiovascular;  Laterality: N/A;   ENDOVEIN HARVEST OF GREATER SAPHENOUS VEIN   11/12/2021    Procedure: ENDOVEIN HARVEST OF GREATER SAPHENOUS VEIN;  Surgeon: Lovett Sox, MD;  Location: [facility] OR;  Service: Open Heart Surgery;;   EYE SURGERY Bilateral 2022   FISTULA SUPERFICIALIZATION Right 07/10/2022    Procedure: RIGHT ARM ARTERIOVENOUS FISTULA SUPERFICIALIZATION;  Surgeon: Maeola Harman, MD;  Location: [facility] OR;  Service: Vascular;  Laterality: Right;   IR FLUORO GUIDE CV LINE RIGHT   11/28/2021   IR US GUIDE VASC ACCESS  RIGHT   11/28/2021   LEFT HEART CATH AND CORONARY ANGIOGRAPHY N/A 10/24/2021    Procedure: LEFT HEART CATH AND CORONARY ANGIOGRAPHY;  Surgeon: Corky Crafts, MD;  Location: [facility] INVASIVE CV LAB;  Service: Cardiovascular;  Laterality: N/A;   MAZE N/A 11/12/2021    Procedure: MAZE;  Surgeon: Lovett Sox, MD;  Location: [facility] OR;  Service: Open Heart Surgery;  Laterality: N/A;   PERIPHERAL VASCULAR ATHERECTOMY   12/29/2022    Procedure: PERIPHERAL VASCULAR ATHERECTOMY;  Surgeon: Maeola Harman, MD;  Location: [facility] INVASIVE CV LAB;  Service: Cardiovascular;;   PERIPHERAL VASCULAR INTERVENTION   12/29/2022    Procedure: PERIPHERAL VASCULAR INTERVENTION;  Surgeon: Maeola Harman, MD;  Location: [facility] INVASIVE CV LAB;  Service: Cardiovascular;;  Left SFA, Bilateral illiac   TEE WITHOUT CARDIOVERSION  N/A 11/12/2021    Procedure: TRANSESOPHAGEAL ECHOCARDIOGRAM (TEE);  Surgeon: Lovett Sox, MD;  Location: [facility] OR;  Service: Open Heart Surgery;  Laterality: N/A;   TEE WITHOUT CARDIOVERSION N/A 11/21/2021    Procedure: TRANSESOPHAGEAL ECHOCARDIOGRAM (TEE);  Surgeon: Laurey Morale, MD;  Location: [facility] ENDOSCOPY;  Service: Cardiovascular;  Laterality: N/A;          Allergies  No Known Allergies           Current Outpatient Medications  Medication Sig Dispense Refill   albuterol (VENTOLIN HFA) 108 (90 Base) MCG/ACT inhaler Inhale 2 puffs into the lungs every 6 (six) hours as needed for wheezing or shortness of breath.       amiodarone (PACERONE) 200 MG tablet Take 1 tablet (200 mg total) by mouth 2 (two) times daily. 180 tablet 3   atorvastatin (LIPITOR) 80 MG tablet Take 1 tablet (80 mg total) by mouth daily. 90 tablet 2   B Complex-C-Folic Acid (DIALYVITE 800) 0.8 MG TABS Take 0.8 mg by mouth daily.       clopidogrel (PLAVIX) 75 MG tablet Take 1 tablet (75 mg total) by mouth daily. 30 tablet 11   Dulaglutide (TRULICITY) 3 MG/0.5ML SOPN Inject 3 mg into the skin every Wednesday.        ELIQUIS 5 MG TABS tablet TAKE 1 TABLET TWICE DAILY 180 tablet 3   fluticasone furoate-vilanterol (BREO ELLIPTA) 200-25 MCG/ACT AEPB Inhale 1 puff into the lungs daily.   3   insulin glargine (LANTUS) 100 UNIT/ML injection Inject 38 Units into the skin 2 (two) times daily.       Insulin Pen Needle 32G X 4 MM MISC Use to inject Levemir 2 (two) times daily. 100 each 1   LEVEMIR FLEXPEN 100 UNIT/ML FlexPen INJECT 30 UNITS UNDER THE SKIN TWICE DAILY 60 mL 3   Methoxy PEG-Epoetin Beta (MIRCERA IJ) Mircera       metoprolol succinate (TOPROL-XL) 25 MG 24 hr tablet Take 25 mg by mouth daily.       midodrine (PROAMATINE) 5 MG tablet Take 1 tablet (5 mg total) by mouth daily. 90 tablet 3   sulfamethoxazole-trimethoprim (BACTRIM DS) 800-160 MG tablet Take 1 tablet by mouth 2 (two) times daily.       torsemide (DEMADEX) 20 MG tablet Take 1 tablet (20 mg total) by mouth daily. 90 tablet 1   traMADol (ULTRAM) 50 MG tablet Take 1 tablet (50 mg total) by mouth every 8 (eight) hours as needed. 15 tablet 0               Current Facility-Administered Medications  Medication Dose Route Frequency Provider Last Rate Last Admin   0.9 %  sodium chloride infusion  250 mL Intravenous PRN Maeola Harman, MD       sodium chloride flush (NS) 0.9 % injection 3 mL  3 mL Intravenous Q12H Maeola Harman, MD                 Family History  Problem Relation Age of Onset   CAD Brother     Drug abuse Brother     Lung cancer Father     CAD Mother            Social History         Socioeconomic History   Marital status: Married      Spouse name: Not on file   Number of children: Not on file   Years of education: Not on file  Highest education level: Not on file  Occupational History   Not on file  Tobacco Use   Smoking status: Former      Current packs/day: 0.00      Types: Cigarettes      Quit date: 2007      Years since quitting: 17.5   Smokeless tobacco: Never  Vaping Use    Vaping status: Never Used  Substance and Sexual Activity   Alcohol use: No   Drug use: Not Currently      Frequency: 1.0 times per week      Types: Marijuana   Sexual activity: Not on file  Other Topics Concern   Not on file  Social History Narrative   Not on file    Social Determinants of Health    Financial Resource Strain: Not on file  Food Insecurity: Not on file  Transportation Needs: Not on file  Physical Activity: Not on file  Stress: Not on file  Social Connections: Not on file  Intimate Partner Violence: Not on file        REVIEW OF SYSTEMS:    [X]  denotes positive finding, [ ]  denotes negative finding Cardiac   Comments:  Chest pain or chest pressure:      Shortness of breath upon exertion:      Short of breath when lying flat:      Irregular heart rhythm:             Vascular      Pain in calf, thigh, or hip brought on by ambulation:      Pain in feet at night that wakes you up from your sleep:       Blood clot in your veins:      Leg swelling:              Pulmonary      Oxygen at home:      Productive cough:       Wheezing:              Neurologic      Sudden weakness in arms or legs:       Sudden numbness in arms or legs:       Sudden onset of difficulty speaking or slurred speech:      Temporary loss of vision in one eye:       Problems with dizziness:              Gastrointestinal      Blood in stool:       Vomited blood:              Genitourinary      Burning when urinating:       Blood in urine:             Psychiatric      Major depression:              Hematologic      Bleeding problems:      Problems with blood clotting too easily:             Skin      Rashes or ulcers:             Constitutional      Fever or chills:          PHYSICAL EXAMINATION:      Today's Vitals    01/20/23 1127  BP: 105/83  Pulse: 68  Resp: 18  Temp:  98.6 F (37 C)  TempSrc: Temporal  SpO2: 96%  Weight: 253 lb 11.2 oz (115.1 kg)   Height: 5\' 10"  (1.778 m)    Body mass index is 36.4 kg/m.     General:  WDWN in NAD; vital signs documented above Gait: Not observed HENT: WNL, normocephalic Pulmonary: normal non-labored breathing , without wheezing Cardiac: regular HR, without carotid bruits Abdomen: obese Skin: without rashes Vascular Exam/Pulses: Brisk biphasic left peroneal>PT; faint left AT doppler signal Right monophasic DP and peroneal doppler signals.  Extremities: with ischemic changes and non healing toe amputation       Musculoskeletal: no muscle wasting or atrophy       Neurologic: A&O X 3 Psychiatric:  The pt has Normal affect.     Non-Invasive Vascular Imaging:   ABI's/TBI's on 01/20/2023: Right:  0.51/0.29 - Great toe pressure: 44 Left:  0.89/amp - Great toe pressure: amp   Arterial duplex on 01/20/2023: +-----------+--------+-----+---------------+----------+--------+  LEFT      PSV cm/sRatioStenosis       Waveform  Comments  +-----------+--------+-----+---------------+----------+--------+  CFA Prox   290          50-74% stenosistriphasic           +-----------+--------+-----+---------------+----------+--------+  DFA       194                         biphasic            +-----------+--------+-----+---------------+----------+--------+  SFA Prox   188                         monophasic          +-----------+--------+-----+---------------+----------+--------+  POP Prox   162                         monophasic          +-----------+--------+-----+---------------+----------+--------+  POP Mid    171                         monophasic          +-----------+--------+-----+---------------+----------+--------+  POP Distal 151                         monophasic          +-----------+--------+-----+---------------+----------+--------+  ATA Distal              occluded                            +-----------+--------+-----+---------------+----------+--------+  PTA Distal 111                         monophasic          +-----------+--------+-----+---------------+----------+--------+  PERO Distal34                          monophasic          +-----------+--------+-----+---------------+----------+--------+   Unable to well visualize stent in popliteal artery.    Left Stent(s):  +---------------+--------+---------------+----------+--------+  SFA           PSV cm/sStenosis       Waveform  Comments  +---------------+--------+---------------+----------+--------+  Prox to Stent  139  monophasic          +---------------+--------+---------------+----------+--------+  Proximal Stent 144                    monophasic          +---------------+--------+---------------+----------+--------+  Mid Stent      165                    monophasic          +---------------+--------+---------------+----------+--------+  Distal Stent   146                    monophasic          +---------------+--------+---------------+----------+--------+  Distal to Stent211     50-99% stenosismonophasic          +---------------+--------+---------------+----------+--------+   Summary:  Left: 50-74% stenosis noted in the common femoral artery. Total occlusion noted in the anterior tibial artery. Elevated velocities distal to stent, otherwise SFA stent appears patent. Unable to well visualize stent struts in popliteal artery, however artery appears patent          ASSESSMENT/PLAN:: 67 y.o. male here for follow up for PAD with hx of aortogram with laser atherectomy and stenting left SFA and popliteal arteries on 12/29/2022 by Dr. Randie Heinz for chronic LLE threatening ischemia with toe ulceration.      -pt non healing left great toe amputation.   Duplex today reveals his stents are patent.  There is a mild stenosis distal to the stent.   Pt seen with Dr.  Chestine Spore and he discussed that the foot is close to being non salvageable.  Dr. Chestine Spore felt we could repeat angiogram on Thursday for possible retrograde access of the left AT.  Pt has appt with Dr. Marylene Land on Thursday afternoon and does not want to cancel this appt.  I discussed with pt that he is at very high risk for proximal amputation.  We could not proceed tomorrow as pt is on Eliquis.  He has dialysis on M/F.  Pt and wife understand he is at high risk and we recommend proceeding with angiography as soon as possible. -continue asa/statin/plavix       Doreatha Massed, [facility] [phone]   Clinic MD:   Chestine Spore

## 2023-01-22 NOTE — Op Note (Signed)
Patient name: [name] [name] MRN: [id number] DOB: 21-Sep-1955 Sex: male  01/22/2023 Pre-operative Diagnosis: Critical limb ischemia of the left lower extremity with nonhealing toe amputation Post-operative diagnosis:  Same Surgeon:  [name], MD Procedure Performed: 1.  Ultrasound-guided access right common femoral artery 2.  Left lower extremity angiogram with catheter selection of left external iliac artery 3.  Ultrasound-guided access of the left dorsalis pedis artery retrograde at the ankle 4.  Unsuccessful attempt at retrograde recanalization of the anterior tibial artery for chronic total occlusion 5.  Mynx closure of the right common femoral artery 6.  38 minutes of monitored moderate conscious sedation time  Indications: Patient is a 67 year old male with end-stage renal disease that that recently underwent left SFA/popliteal atherectomy and stenting by Dr. Randie Heinz on 12/29/2022.  He has since undergone a toe amputation that has been nonhealing.  He was seen in the office on Tuesday.  This has been been progressive.  I discussed he is high risk for limb loss.  He presents after risk and benefits discussed.  Findings:   Ultrasound-guided access right common femoral artery.  We went over the aortic bifurcation.  Left lower extremity arteriogram from a external iliac artery approach showed that his SFA stents were widely patent as well as his popliteal artery and two-vessel runoff in the peroneal and posterior tibial.  He has an occluded anterior tibial artery over long segment with severe small vessel disease in the foot.  Ultimately I accessed the left dorsalis pedis artery retrograde at the foot and got a slender sheath in place.  With my wire and catheter I tried coming retrograde and I could not reenter into the true lumen.  Ultimately we aborted after the patient did not feel he could tolerate any further attempts.   Procedure:  The patient was identified in the holding area  and taken to room 8.  The patient was then placed supine on the table and prepped and draped in the usual sterile fashion.  A time out was called.  Patient received Versed and fentanyl for conscious moderate sedation.  Vital signs were monitored including heart rate, respiratory rate, oxygenation, and blood pressure.  I was present for all of moderate sedation.  Ultrasound was used to evaluate the right common femoral artery.  It was patent .  A digital ultrasound image was acquired.  A micropuncture needle was used to access the right common femoral artery under ultrasound guidance.  An 018 wire was advanced without resistance and a micropuncture sheath was placed.  The 018 wire was removed and a benson wire was placed.  The micropuncture sheath was exchanged for a 5 french sheath.  Next, using the omniflush catheter and a benson wire, the aortic bifurcation was crossed and the catheter was placed into theleft external iliac artery and left runoff was obtained.  Ultimately elected to try and intervene on the occluded anterior tibial given he had no flow out to his toe amputation.  The foot was prepped and draped.  The dorsalis pedis was evaluated with ultrasound and this was accessed retrograde with a micropuncture needle and wire and then I placed the pedal sheath.  Patient received 100 units/kg IV heparin.  I then used a V18 wire with a CXI catheter to come retrograde.  I used a number of wires and catheters with retrograde attempt and really could not get up to the true lumen.  We ultimately elected to abort.  Wires and catheters were removed.  The pedal sheath was removed.  Mynx closure was deployed in the groin.   Plan: Discussed that I think he needs a left below-knee amputation.  I offered to get this scheduled today.  He wants to talk with Dr. Marylene Land first.  Will arrange follow-up in 1 to 2 weeks in the office.  [name], MD [facility] [place] Office:  [phone]

## 2023-01-23 ENCOUNTER — Other Ambulatory Visit: Payer: Self-pay

## 2023-01-23 ENCOUNTER — Encounter (HOSPITAL_COMMUNITY): Payer: Self-pay | Admitting: Vascular Surgery

## 2023-01-23 ENCOUNTER — Other Ambulatory Visit: Payer: Self-pay | Admitting: *Deleted

## 2023-01-23 DIAGNOSIS — I70262 Atherosclerosis of native arteries of extremities with gangrene, left leg: Secondary | ICD-10-CM

## 2023-01-23 NOTE — Progress Notes (Signed)
Anesthesia Chart Review: SAME DAY WORK-UP  Case: [id number] Date/Time: 01/26/23 1147   Procedures:      LEFT AMPUTATION BELOW KNEE (Left: Knee)     APPLICATION OF WOUND VAC (Left)   Anesthesia type: Choice   Pre-op diagnosis: critical limb ishemia   [facility] 11 / [facility]   Surgeons: Leonie Krishon, MD       DISCUSSION:  Patient is a 67 year old male scheduled for the above procedure. S/p laser arthrectomy/stents left SFA & Popliteal arteries 12/29/22 then left great toe amputation on 12/30/22. The toe amputation site has not healed. He underwent LLE angiogram on 01/22/23 with unsuccessful attempt at retrograde recanalization of the AT artery for CTO by Dr. Sherald Hess. He thought patient would require left BKA and offered to perform on 01/22/23, but patient desired to discuss with his podiatrist first. Surgery now scheduled for 01/26/23.   History includes former smoker, CAD (s/p CABG: LIMA--LAD, SVG-OM1, SVG-PDA, LA MAZE Procedure/RF ablation 11/12/21; had post-operative left retroperitoneal hematoma, s/p aspiration/antibiotics, no growth on cultures; CKD->ESRD), chronic systolic CHF (in setting of afib with normalization of EF after 2016 DCCV; recurrent afib 05/09/21), atrial fibrillation/PAF (diagnosed ~ 07/2014; s/p DCCV 01/18/15, recurrent 05/09/21, s/p DCCV 11/2021 x4 & 02/27/22; recurrent 04/17/22, planned DCCV 04/24/22 but EKG showed NSR), sinus node dysfunction/bradycardia, HTN, HLD, DM2 with neuropathy, PAD (laser arthrectomy/stents left SFA & Popliteal arteries 12/29/22), obesity, OSA (uses CPAP), ESRD (CKD progressed to ESRD post CABG, started CRRT 11/15/21-11/20/21, HD started 11/23/21, right IJ [facility] 11/28/21; RUE AVF revision 07/10/22; undergoing 2x/week as of 01/20/23).   Last cardiology visit with Dr. Dulce Sellar was on 08/19/22. CAD felt stable. Volume status managed by HD. Follow-up in 6 months planned.  Last EP follow-up with Dr. Elberta Fortis was on 01/20/23. EKG from 12/29/22 showed NSR. Continue  amiodarone and Eliquis. Six month follow-up planned.   Left BKA just recommended as of 01/22/23, so last Trulicity 01/21/2023, last Eliquis 01/23/2023, and last Plavix 01/22/2023.   He is a same day work-up. Anesthesia team to evaluate on the day of surgery.     VS:  BP Readings from Last 3 Encounters:  01/22/23 (!) 146/133  01/20/23 130/76  01/20/23 105/83   Pulse Readings from Last 3 Encounters:  01/22/23 (!) 59  01/20/23 74  01/20/23 68     PROVIDERS: [name], PA-C is PCP  Loman Brooklyn, MD is EP cardiologist Norman Herrlich, MD is cardiologist Marca Ancona, MD is HF cardiologist (consulted post CABG) Marcellus Scott, MD is pulmonologist Donata Clay, Theron Arista, MD is CT surgeon Asencion Islam, DPM is podiatrist He is also followed by [facility]. HD @ [facility].   LABS: For day of surgery. As of 01/22/23, H/H 9.2/27.0, glucose 111. A1c 7.5% 09/29/22 (CE).    PFTs 11/08/21: FVC 2.44 (53%), post 2.45 (54$). FEV1 1.88 (55%), post 1.98 (58%). DLCO unc 17.96 (67%), cor 18.63 (70%).     IMAGES: CXR 01/06/22: FINDINGS: Stable cardiomegaly. Sternotomy wires are noted. Right internal jugular dialysis catheter is unchanged in position. Mild bibasilar subsegmental atelectasis is noted with small pleural effusions. No pneumothorax is noted. Bony thorax is unremarkable. IMPRESSION: Mild bibasilar subsegmental atelectasis with small pleural effusions.     EKG: 12/29/22: Sinus rhythm with 1st degree A-V block Left axis deviation Right bundle branch block Abnormal ECG When compared with ECG of 24-Apr-2022 09:08, PREVIOUS ECG IS PRESENT Confirmed by Orpah Cobb ([id number]) on 01/01/2023 11:09:07 AM   CV: Long term monitor 01/20/22-02/01/22: - Patch Wear  Time:  12 days and 10 hours (2023-07-31T11:42:52-0400 to 2023-08-12T22:32:43-0400) - Atrial Flutter occurred continuously (100% burden), ranging from 62-138 bpm (avg of 96 bpm).  - There were 6 triggered events all atrial  flutter with a rapid ventricular rate. - Isolated VEs were rare (<1.0%), VE Couplets were rare (<1.0%), and no VE Triplets were present.  -  Previously notified: MD notification criteria for First Documentation of Atrial Flutter met - notified Dema Severin RN on 20 Jan 2022 at 2:16 PM ET (RP).     TEE 11/21/21: IMPRESSIONS   1. Left ventricular ejection fraction, by estimation, is 55 to 60%. The  left ventricle has normal function. The left ventricle has no regional  wall motion abnormalities but there is septal bounce suggestive of prior  cardiac surgery.   2. Right ventricular systolic function is mildly reduced. The right  ventricular size is normal. Tricuspid regurgitation signal is inadequate  for assessing PA pressure.   3. Left atrial size was mildly dilated. No left atrial/left atrial  appendage thrombus was detected.   4. There is a PFO by color doppler and bubble study.   5. The mitral valve is normal in structure. Mild mitral valve  regurgitation. No evidence of mitral stenosis.   6. The aortic valve is tricuspid. There is mild calcification of the  aortic valve. Aortic valve regurgitation is not visualized. No aortic  stenosis is present.   7. A small pericardial effusion is present, primarily towards the apex.      TTE 11/13/21: IMPRESSIONS   1. Left ventricular ejection fraction, by estimation, is 55 to 60%. The  left ventricle has normal function. Left ventricular endocardial border  not optimally defined to evaluate regional wall motion. Left ventricular  diastolic parameters are consistent  with Grade II diastolic dysfunction (pseudonormalization).   2. Peak RV-RA gradient 28 mmHg. Right ventricular systolic function was  not well visualized. The right ventricular size is not well visualized.   3. The mitral valve was not well visualized. No evidence of mitral valve  regurgitation. No evidence of mitral stenosis.   4. The aortic valve is tricuspid. There is mild  calcification of the  aortic valve. Aortic valve regurgitation is not visualized. Aortic valve  sclerosis is present, with no evidence of aortic valve stenosis.   5. No significant pericardial effusion.   6. The heart is poorly visualized. Grossly, LV systolic function appears  to be in the normal range.  - Comparison LVEF 50-55% 07/30/21; 60-65% 04/12/18; 30% 08/11/14.      US Carotid 11/08/21: Summary:  - Right Carotid: Velocities in the right ICA are consistent with a 1-39%  stenosis.  - Left Carotid: Velocities in the left ICA are consistent with a 1-39%  stenosis.  - Vertebrals:  Bilateral vertebral arteries demonstrate antegrade flow.  - Subclavians: Normal flow hemodynamics were seen in bilateral subclavian               arteries.      LHC 10/24/21 (PRE-CABG):   Ost LM to Mid LM lesion is 50% stenosed.   Dist LM lesion is 75% stenosed.   Ost LAD to Prox LAD lesion is 50% stenosed.  Ectatic segment with eccentric calcium.   Ost Cx to Prox Cx lesion is 70% stenosed.   Mid LAD lesion is 50% stenosed.   Prox RCA to Mid RCA lesion is 70% stenosed.   LV end diastolic pressure is normal.   There is no aortic valve stenosis.   -  Eccentric, severely calcified left main disease extending into the ostial LAD.  There is also ostial disease and a relatively small circumflex vessel.  Diffuse, calcific disease in the mid RCA today. - Patient was initially scheduled for A-fib ablation.  Plan now for cardiac surgery consult with consideration of CABG, maze and left atrial appendage clip.     CT Cardiac (ordered for anticipated ablation) 10/18/21: IMPRESSION: 1. There is normal pulmonary vein drainage into the left atrium. No pulmonary vein stenosis. 2. Normal left atrial appendage, no left atrial appendage thrombus. No intracardiac mass or thrombus. 3. The esophagus runs in the left atrial midline and is not in the proximity to any of the pulmonary veins. 4. Coronary artery calcium score is  4439, which is 99th percentile for age and sex matched peers. Dense 3 vessel coronary calcifications involving the proximal vessels including left main coronary artery. 5. Mild dilation of main pulmonary artery, 30 mm, may indicate increased pulmonary pressure. - Referred for LHC by Dr. Elberta Fortis.   Past Medical History:  Diagnosis Date   Anemia 04/17/2022   Blood transfusion   Atherosclerotic heart disease    Benign essential HTN 07/30/2014   Bradycardia 01/25/2015   CHF (congestive heart failure) (HCC)    Chronic anticoagulation 02/25/2018   Chronic systolic CHF (congestive heart failure), NYHA class 3 (HCC) 08/30/2014   Overview:  Global ef 30%   CKD (chronic kidney disease)    Class 3 severe obesity in adult (HCC) 04/28/2017   Coronary artery disease    Diabetes mellitus without complication (HCC)    Diabetic neuropathy (HCC) 07/28/2014   Dysrhythmia    Erectile dysfunction    High risk medication use 05/26/2018   Hypertensive heart disease with heart failure (HCC) 07/30/2014   Leukocytosis 11/26/2021   Lumbago    LV dysfunction 04/28/2017   Mixed hyperlipidemia 07/28/2014   OSA (obstructive sleep apnea)    CPAP   Paroxysmal atrial fibrillation (HCC) 07/28/2014   Postop check 01/06/2022   S/P CABG x 3 11/12/2021   Sinus node dysfunction (HCC) 04/21/2016   Testicular hypofunction    Uncontrolled type 2 diabetes mellitus with microalbuminuric diabetic nephropathy 07/28/2014    Past Surgical History:  Procedure Laterality Date   ABDOMINAL AORTOGRAM W/LOWER EXTREMITY N/A 12/29/2022   Procedure: ABDOMINAL AORTOGRAM W/LOWER EXTREMITY;  Surgeon: Maeola Harman, MD;  Location: [facility] INVASIVE CV LAB;  Service: Cardiovascular;  Laterality: N/A;   ABDOMINAL AORTOGRAM W/LOWER EXTREMITY N/A 01/22/2023   Procedure: ABDOMINAL AORTOGRAM W/LOWER EXTREMITY;  Surgeon: [name], MD;  [facility]: [facility] INVASIVE CV LAB;  Service: Cardiovascular;  Laterality: N/A;   AV FISTULA  PLACEMENT Right 05/20/2022   Procedure: RIGHT ARM ARTERIOVENOUS (AV) FISTULA CREATION;  Surgeon: Larina Earthly, MD;  Location: [facility] OR;  Service: Vascular;  Laterality: Right;   BUBBLE STUDY  11/21/2021   Procedure: BUBBLE STUDY;  Surgeon: Laurey Morale, MD;  Location: [facility] ENDOSCOPY;  Service: Cardiovascular;;   CARDIAC CATHETERIZATION     CARDIOVERSION N/A 11/21/2021   Procedure: CARDIOVERSION;  Surgeon: Laurey Morale, MD;  Location: [facility] ENDOSCOPY;  Service: Cardiovascular;  Laterality: N/A;   CARDIOVERSION N/A 11/25/2021   Procedure: CARDIOVERSION;  Surgeon: Laurey Morale, MD;  Location: [facility] ENDOSCOPY;  Service: Cardiovascular;  Laterality: N/A;   CARDIOVERSION N/A 11/29/2021   Procedure: CARDIOVERSION;  Surgeon: Laurey Morale, MD;  Location: [facility] ENDOSCOPY;  Service: Cardiovascular;  Laterality: N/A;   CARDIOVERSION N/A 02/27/2022   Procedure: CARDIOVERSION;  Surgeon: Little Ishikawa,  MD;  Location[facility] ENDOSCOPY;  Service: Cardiovascular;  Laterality: N/A;   CORONARY ARTERY BYPASS GRAFT N/A 11/12/2021   Procedure: CORONARY ARTERY BYPASS GRAFTING (CABG) x  3 USING LEFT INTERNAL MAMMARY ARTERY AND RIGHT GREATER SAPHENOUS VEIN;  Surgeon: Lovett Sox, MD;  Location: [facility] OR;  Service: Open Heart Surgery;  Laterality: N/A;   ELECTROPHYSIOLOGIC STUDY N/A 01/18/2015   Procedure: CARDIOVERSION;  Surgeon: Lamar Blinks, MD;  Location: [facility] ORS;  Service: Cardiovascular;  Laterality: N/A;   ENDOVEIN HARVEST OF GREATER SAPHENOUS VEIN  11/12/2021   Procedure: ENDOVEIN HARVEST OF GREATER SAPHENOUS VEIN;  Surgeon: Lovett Sox, MD;  Location: [facility];  Service: Open Heart Surgery;;   EYE SURGERY Bilateral 2022   FISTULA SUPERFICIALIZATION Right 07/10/2022   Procedure: RIGHT ARM ARTERIOVENOUS FISTULA SUPERFICIALIZATION;  Surgeon: Maeola Harman, MD;  Location[facility] OR;  Service: Vascular;  Laterality: Right;   IR FLUORO GUIDE CV LINE RIGHT  11/28/2021   IR US GUIDE VASC ACCESS  RIGHT  11/28/2021   LEFT HEART CATH AND CORONARY ANGIOGRAPHY N/A 10/24/2021   Procedure: LEFT HEART CATH AND CORONARY ANGIOGRAPHY;  Surgeon: Corky Crafts, MD;  [facility] INVASIVE CV LAB;  Service: Cardiovascular;  Laterality: N/A;   MAZE N/A 11/12/2021   Procedure: MAZE;  Surgeon: Lovett Sox, MD;  Location: [facility] OR;  Service: Open Heart Surgery;  Laterality: N/A;   PERIPHERAL VASCULAR ATHERECTOMY  12/29/2022   Procedure: PERIPHERAL VASCULAR ATHERECTOMY;  Surgeon: Maeola Harman, MD;  Location: [facility] INVASIVE CV LAB;  Service: Cardiovascular;;   PERIPHERAL VASCULAR INTERVENTION  12/29/2022   Procedure: PERIPHERAL VASCULAR INTERVENTION;  Surgeon: Maeola Harman, MD;  Location: [facility] INVASIVE CV LAB;  Service: Cardiovascular;;  Left SFA, Bilateral illiac   TEE WITHOUT CARDIOVERSION N/A 11/12/2021   Procedure: TRANSESOPHAGEAL ECHOCARDIOGRAM (TEE);  Surgeon: Lovett Sox, MD;  Location: [facility] OR;  Service: Open Heart Surgery;  Laterality: N/A;   TEE WITHOUT CARDIOVERSION N/A 11/21/2021   Procedure: TRANSESOPHAGEAL ECHOCARDIOGRAM (TEE);  Surgeon: Laurey Morale, MD;  Location: [facility] ENDOSCOPY;  Service: Cardiovascular;  Laterality: N/A;    MEDICATIONS:  0.9 %  sodium chloride infusion   0.9 %  sodium chloride infusion   sodium chloride flush (NS) 0.9 % injection 3 mL   sodium chloride flush (NS) 0.9 % injection 3 mL    albuterol (VENTOLIN HFA) 108 (90 Base) MCG/ACT inhaler   amiodarone (PACERONE) 200 MG tablet   atorvastatin (LIPITOR) 80 MG tablet   B Complex-C-Folic Acid (DIALYVITE 800) 0.8 MG TABS   clopidogrel (PLAVIX) 75 MG tablet   Dulaglutide (TRULICITY) 3 MG/0.5ML SOPN   ELIQUIS 5 MG TABS tablet   fluticasone furoate-vilanterol (BREO ELLIPTA) 200-25 MCG/ACT AEPB   HYDROcodone-acetaminophen (NORCO) 10-325 MG tablet   insulin glargine (LANTUS) 100 UNIT/ML injection   metoprolol succinate (TOPROL-XL) 25 MG 24 hr tablet   midodrine (PROAMATINE) 5 MG tablet    promethazine (PHENERGAN) 12.5 MG tablet   torsemide (DEMADEX) 20 MG tablet   traMADol (ULTRAM) 50 MG tablet   Insulin Pen Needle 32G X 4 MM MISC   Methoxy PEG-Epoetin Beta (MIRCERA IJ)    Shonna Chock, PA-C Surgical Short Stay/Anesthesiology [facility] Phone [phone] [facility] Phone [phone] 01/23/2023 5:54 PM

## 2023-01-23 NOTE — Progress Notes (Signed)
SDW call  Patient was given pre-op instructions over the phone. Patient verbalized understanding of instructions provided.     PCP - [name], PA-C EP Cardiologist - Dr. Loman Brooklyn Nephrology: [facility], [facility] Pulmonary:    PPM/ICD - denies Device Orders - n/a Rep Notified - n/a   Chest x-ray - 01/06/2022 EKG -  12/29/2022 Stress Test - ECHO - 11/13/2021 Cardiac Cath - 10/24/2021  Sleep Study/sleep apnea/CPAP: Diagnosed with sleep apnea. Wears a CPAP nightly.  Instructed to bring CPAP mask and tubing.   Type II diabetic Fasting Blood sugar range: 110-115 How often check sugars: Weekly Trulicity, states last dose 01/21/2023 Insulin glargine (Lantus), instructed to use 19 units the night before and the morning of surgery.  This is 50% of regular dose   Blood Thinner Instructions: Eliquis, states last dose 01/23/2023.  Plavix, states last dose 01/22/2023 Aspirin Instructions:denies   ERAS Protcol - No, NPO   COVID TEST- n/a    Anesthesia review: HTN, DM, CAD, CHD, sleep apnea with CPAP   Patient denies shortness of breath, fever, cough and chest pain over the phone call  Your procedure is scheduled on Monday Augua 5, 2024  Report to [facility] Main Entrance "A" at 0930 A.M., then check in with the Admitting office.  Call this number if you have problems the morning of surgery:  [phone]   If you have any questions prior to your surgery date call [phone]: Open Monday-Friday 8am-4pm If you experience any cold or flu symptoms such as cough, fever, chills, shortness of breath, etc. between now and your scheduled surgery, please notify us at the above number    Remember:  Do not eat or drink after midnight the night before your surgery  Take these medicines the morning of surgery with A SIP OF WATER:  Amiodarone, atorvastatin, Breo ellipta, metoprolol, midodrine  As needed: Albuterol, norco, phenergan, tramadol  As of today, STOP taking any Aspirin  (unless otherwise instructed by your surgeon) Aleve, Naproxen, Ibuprofen, Motrin, Advil, Goody's, BC's, all herbal medications, fish oil, and all vitamins.

## 2023-01-23 NOTE — Anesthesia Preprocedure Evaluation (Addendum)
Anesthesia Evaluation  Patient identified by MRN, date of birth, ID band Patient awake    Reviewed: Allergy & Precautions, NPO status , Patient's Chart, lab work & pertinent test results  History of Anesthesia Complications Negative for: history of anesthetic complications  Airway Mallampati: III  TM Distance: >3 FB Neck ROM: Full    Dental  (+) Edentulous Upper, Edentulous Lower, Dental Advisory Given   Pulmonary neg shortness of breath, sleep apnea , neg COPD, neg recent URI, former smoker   breath sounds clear to auscultation       Cardiovascular hypertension, Pt. on medications (-) angina + CAD, + CABG, + Peripheral Vascular Disease and +CHF  + dysrhythmias Atrial Fibrillation  Rhythm:Regular  1. Left ventricular ejection fraction, by estimation, is 55 to 60%. The  left ventricle has normal function. The left ventricle has no regional  wall motion abnormalities but there is septal bounce suggestive of prior  cardiac surgery.   2. Right ventricular systolic function is mildly reduced. The right  ventricular size is normal. Tricuspid regurgitation signal is inadequate  for assessing PA pressure.   3. Left atrial size was mildly dilated. No left atrial/left atrial  appendage thrombus was detected.   4. There is a PFO by color doppler and bubble study.   5. The mitral valve is normal in structure. Mild mitral valve  regurgitation. No evidence of mitral stenosis.   6. The aortic valve is tricuspid. There is mild calcification of the  aortic valve. Aortic valve regurgitation is not visualized. No aortic  stenosis is present.   7. A small pericardial effusion is present, primarily towards the apex.     Neuro/Psych negative neurological ROS  negative psych ROS   GI/Hepatic Neg liver ROS,GERD  ,,  Endo/Other  diabetes, Type 2, Insulin Dependent  Lab Results      Component                Value               Date                       HGBA1C                   9.3 (H)             11/08/2021              Renal/GU ESRF and DialysisRenal disease     Musculoskeletal negative musculoskeletal ROS (+)    Abdominal   Peds  Hematology  (+) Blood dyscrasia, anemia Lab Results      Component                Value               Date                      WBC                      18.5 (H)            01/26/2023                HGB                      8.1 (L)             01/26/2023  HCT                      26.3 (L)            01/26/2023                MCV                      90.4                01/26/2023                PLT                      451 (H)             01/26/2023            Plavix/eliquis held x 2 days   Anesthesia Other Findings Left  critical limb ishemia  Reproductive/Obstetrics                              Anesthesia Physical Anesthesia Plan  ASA: 4  Anesthesia Plan: MAC and Regional   Post-op Pain Management: Regional block*   Induction: Intravenous  PONV Risk Score and Plan: 1 and Propofol infusion and Treatment may vary due to age or medical condition  Airway Management Planned: Nasal Cannula, Natural Airway and Simple Face Mask  Additional Equipment: None  Intra-op Plan:   Post-operative Plan:   Informed Consent: I have reviewed the patients History and Physical, chart, labs and discussed the procedure including the risks, benefits and alternatives for the proposed anesthesia with the patient or authorized representative who has indicated his/her understanding and acceptance.     Dental advisory given  Plan Discussed with: CRNA  Anesthesia Plan Comments: (PAT note written 01/23/2023 by Shonna Chock, PA-C.  )        Anesthesia Quick Evaluation

## 2023-01-24 DIAGNOSIS — Z992 Dependence on renal dialysis: Secondary | ICD-10-CM | POA: Diagnosis not present

## 2023-01-24 DIAGNOSIS — N186 End stage renal disease: Secondary | ICD-10-CM | POA: Diagnosis not present

## 2023-01-24 DIAGNOSIS — N2581 Secondary hyperparathyroidism of renal origin: Secondary | ICD-10-CM | POA: Diagnosis not present

## 2023-01-26 ENCOUNTER — Inpatient Hospital Stay (HOSPITAL_COMMUNITY): Payer: Medicare HMO | Admitting: Physician Assistant

## 2023-01-26 ENCOUNTER — Other Ambulatory Visit: Payer: Self-pay

## 2023-01-26 ENCOUNTER — Encounter (HOSPITAL_COMMUNITY): Payer: Self-pay | Admitting: Vascular Surgery

## 2023-01-26 ENCOUNTER — Inpatient Hospital Stay (HOSPITAL_COMMUNITY)
Admission: RE | Admit: 2023-01-26 | Discharge: 2023-01-29 | DRG: 856 | Disposition: A | Discharge status: Home Health | Payer: Medicare HMO | Attending: Family Medicine | Admitting: Family Medicine

## 2023-01-26 ENCOUNTER — Encounter (HOSPITAL_COMMUNITY)
Admission: RE | Disposition: A | Discharge status: Home Health | Payer: Self-pay | Source: Home / Self Care | Attending: Family Medicine

## 2023-01-26 DIAGNOSIS — Z7901 Long term (current) use of anticoagulants: Secondary | ICD-10-CM | POA: Diagnosis not present

## 2023-01-26 DIAGNOSIS — E1122 Type 2 diabetes mellitus with diabetic chronic kidney disease: Secondary | ICD-10-CM | POA: Diagnosis present

## 2023-01-26 DIAGNOSIS — Z794 Long term (current) use of insulin: Secondary | ICD-10-CM | POA: Diagnosis not present

## 2023-01-26 DIAGNOSIS — G4733 Obstructive sleep apnea (adult) (pediatric): Secondary | ICD-10-CM | POA: Diagnosis present

## 2023-01-26 DIAGNOSIS — L089 Local infection of the skin and subcutaneous tissue, unspecified: Secondary | ICD-10-CM | POA: Diagnosis present

## 2023-01-26 DIAGNOSIS — I2510 Atherosclerotic heart disease of native coronary artery without angina pectoris: Secondary | ICD-10-CM | POA: Diagnosis not present

## 2023-01-26 DIAGNOSIS — Z7951 Long term (current) use of inhaled steroids: Secondary | ICD-10-CM

## 2023-01-26 DIAGNOSIS — E669 Obesity, unspecified: Secondary | ICD-10-CM | POA: Diagnosis present

## 2023-01-26 DIAGNOSIS — N186 End stage renal disease: Secondary | ICD-10-CM | POA: Diagnosis not present

## 2023-01-26 DIAGNOSIS — J449 Chronic obstructive pulmonary disease, unspecified: Secondary | ICD-10-CM | POA: Diagnosis not present

## 2023-01-26 DIAGNOSIS — E1152 Type 2 diabetes mellitus with diabetic peripheral angiopathy with gangrene: Secondary | ICD-10-CM | POA: Diagnosis present

## 2023-01-26 DIAGNOSIS — Z813 Family history of other psychoactive substance abuse and dependence: Secondary | ICD-10-CM

## 2023-01-26 DIAGNOSIS — Z6837 Body mass index (BMI) 37.0-37.9, adult: Secondary | ICD-10-CM

## 2023-01-26 DIAGNOSIS — Y838 Other surgical procedures as the cause of abnormal reaction of the patient, or of later complication, without mention of misadventure at the time of the procedure: Secondary | ICD-10-CM | POA: Diagnosis present

## 2023-01-26 DIAGNOSIS — Z992 Dependence on renal dialysis: Secondary | ICD-10-CM

## 2023-01-26 DIAGNOSIS — Z87891 Personal history of nicotine dependence: Secondary | ICD-10-CM | POA: Diagnosis not present

## 2023-01-26 DIAGNOSIS — N189 Chronic kidney disease, unspecified: Secondary | ICD-10-CM | POA: Diagnosis not present

## 2023-01-26 DIAGNOSIS — E11621 Type 2 diabetes mellitus with foot ulcer: Secondary | ICD-10-CM | POA: Diagnosis present

## 2023-01-26 DIAGNOSIS — L97529 Non-pressure chronic ulcer of other part of left foot with unspecified severity: Secondary | ICD-10-CM | POA: Diagnosis present

## 2023-01-26 DIAGNOSIS — Z801 Family history of malignant neoplasm of trachea, bronchus and lung: Secondary | ICD-10-CM

## 2023-01-26 DIAGNOSIS — Z79899 Other long term (current) drug therapy: Secondary | ICD-10-CM | POA: Diagnosis not present

## 2023-01-26 DIAGNOSIS — D631 Anemia in chronic kidney disease: Secondary | ICD-10-CM | POA: Diagnosis present

## 2023-01-26 DIAGNOSIS — I70248 Atherosclerosis of native arteries of left leg with ulceration of other part of lower left leg: Secondary | ICD-10-CM | POA: Diagnosis not present

## 2023-01-26 DIAGNOSIS — Z89512 Acquired absence of left leg below knee: Secondary | ICD-10-CM | POA: Diagnosis not present

## 2023-01-26 DIAGNOSIS — E11628 Type 2 diabetes mellitus with other skin complications: Secondary | ICD-10-CM | POA: Diagnosis present

## 2023-01-26 DIAGNOSIS — I5032 Chronic diastolic (congestive) heart failure: Secondary | ICD-10-CM | POA: Diagnosis present

## 2023-01-26 DIAGNOSIS — I132 Hypertensive heart and chronic kidney disease with heart failure and with stage 5 chronic kidney disease, or end stage renal disease: Secondary | ICD-10-CM | POA: Diagnosis not present

## 2023-01-26 DIAGNOSIS — I70222 Atherosclerosis of native arteries of extremities with rest pain, left leg: Secondary | ICD-10-CM

## 2023-01-26 DIAGNOSIS — Z8249 Family history of ischemic heart disease and other diseases of the circulatory system: Secondary | ICD-10-CM

## 2023-01-26 DIAGNOSIS — I480 Paroxysmal atrial fibrillation: Secondary | ICD-10-CM | POA: Diagnosis present

## 2023-01-26 DIAGNOSIS — N179 Acute kidney failure, unspecified: Secondary | ICD-10-CM | POA: Diagnosis not present

## 2023-01-26 DIAGNOSIS — N2581 Secondary hyperparathyroidism of renal origin: Secondary | ICD-10-CM | POA: Diagnosis present

## 2023-01-26 DIAGNOSIS — Z951 Presence of aortocoronary bypass graft: Secondary | ICD-10-CM

## 2023-01-26 DIAGNOSIS — I998 Other disorder of circulatory system: Secondary | ICD-10-CM

## 2023-01-26 DIAGNOSIS — Z7902 Long term (current) use of antithrombotics/antiplatelets: Secondary | ICD-10-CM

## 2023-01-26 DIAGNOSIS — I5042 Chronic combined systolic (congestive) and diastolic (congestive) heart failure: Secondary | ICD-10-CM

## 2023-01-26 DIAGNOSIS — I739 Peripheral vascular disease, unspecified: Secondary | ICD-10-CM | POA: Insufficient documentation

## 2023-01-26 DIAGNOSIS — D509 Iron deficiency anemia, unspecified: Secondary | ICD-10-CM | POA: Diagnosis present

## 2023-01-26 DIAGNOSIS — T8149XA Infection following a procedure, other surgical site, initial encounter: Principal | ICD-10-CM | POA: Diagnosis present

## 2023-01-26 DIAGNOSIS — I70262 Atherosclerosis of native arteries of extremities with gangrene, left leg: Secondary | ICD-10-CM

## 2023-01-26 DIAGNOSIS — D62 Acute posthemorrhagic anemia: Secondary | ICD-10-CM | POA: Diagnosis not present

## 2023-01-26 DIAGNOSIS — E782 Mixed hyperlipidemia: Secondary | ICD-10-CM | POA: Diagnosis present

## 2023-01-26 DIAGNOSIS — I10 Essential (primary) hypertension: Secondary | ICD-10-CM | POA: Diagnosis not present

## 2023-01-26 DIAGNOSIS — R001 Bradycardia, unspecified: Secondary | ICD-10-CM | POA: Diagnosis not present

## 2023-01-26 DIAGNOSIS — E1140 Type 2 diabetes mellitus with diabetic neuropathy, unspecified: Secondary | ICD-10-CM | POA: Diagnosis present

## 2023-01-26 DIAGNOSIS — I120 Hypertensive chronic kidney disease with stage 5 chronic kidney disease or end stage renal disease: Secondary | ICD-10-CM | POA: Diagnosis not present

## 2023-01-26 DIAGNOSIS — G8918 Other acute postprocedural pain: Secondary | ICD-10-CM | POA: Diagnosis not present

## 2023-01-26 DIAGNOSIS — K219 Gastro-esophageal reflux disease without esophagitis: Secondary | ICD-10-CM | POA: Diagnosis present

## 2023-01-26 HISTORY — PX: APPLICATION OF WOUND VAC: SHX5189

## 2023-01-26 HISTORY — DX: Other disorder of circulatory system: I99.8

## 2023-01-26 HISTORY — DX: Peripheral vascular disease, unspecified: I73.9

## 2023-01-26 HISTORY — PX: AMPUTATION: SHX166

## 2023-01-26 HISTORY — DX: Gastro-esophageal reflux disease without esophagitis: K21.9

## 2023-01-26 LAB — GLUCOSE, CAPILLARY
Glucose-Capillary: 152 mg/dL — ABNORMAL HIGH (ref 70–99)
Glucose-Capillary: 131 mg/dL — ABNORMAL HIGH (ref 70–99)
Glucose-Capillary: 141 mg/dL — ABNORMAL HIGH (ref 70–99)
Glucose-Capillary: 124 mg/dL — ABNORMAL HIGH (ref 70–99)
Glucose-Capillary: 143 mg/dL — ABNORMAL HIGH (ref 70–99)

## 2023-01-26 LAB — COMPREHENSIVE METABOLIC PANEL
ALT: 84 U/L — ABNORMAL HIGH (ref 0–44)
AST: 83 U/L — ABNORMAL HIGH (ref 15–41)
Albumin: 2.1 g/dL — ABNORMAL LOW (ref 3.5–5.0)
Alkaline Phosphatase: 74 U/L (ref 38–126)
Anion gap: 13 (ref 5–15)
BUN: 67 mg/dL — ABNORMAL HIGH (ref 8–23)
CO2: 23 mmol/L (ref 22–32)
Calcium: 6.7 mg/dL — ABNORMAL LOW (ref 8.9–10.3)
Chloride: 100 mmol/L (ref 98–111)
Creatinine, Ser: 7.31 mg/dL — ABNORMAL HIGH (ref 0.61–1.24)
GFR, Estimated: 8 mL/min — ABNORMAL LOW (ref >60)
Glucose, Bld: 138 mg/dL — ABNORMAL HIGH (ref 70–99)
Potassium: 3.4 mmol/L — ABNORMAL LOW (ref 3.5–5.1)
Sodium: 136 mmol/L (ref 135–145)
Total Bilirubin: 0.6 mg/dL (ref 0.3–1.2)
Total Protein: 5.9 g/dL — ABNORMAL LOW (ref 6.5–8.1)

## 2023-01-26 LAB — PROTIME-INR
INR: 1.8 — ABNORMAL HIGH (ref 0.8–1.2)
Prothrombin Time: 20.7 seconds — ABNORMAL HIGH (ref 11.4–15.2)

## 2023-01-26 LAB — CBC
HCT: 26.3 % — ABNORMAL LOW (ref 39.0–52.0)
Hemoglobin: 8.1 g/dL — ABNORMAL LOW (ref 13.0–17.0)
MCH: 27.8 pg (ref 26.0–34.0)
MCHC: 30.8 g/dL (ref 30.0–36.0)
MCV: 90.4 fL (ref 80.0–100.0)
Platelets: 451 10*3/uL — ABNORMAL HIGH (ref 150–400)
RBC: 2.91 MIL/uL — ABNORMAL LOW (ref 4.22–5.81)
RDW: 19.7 % — ABNORMAL HIGH (ref 11.5–15.5)
WBC: 18.5 10*3/uL — ABNORMAL HIGH (ref 4.0–10.5)
nRBC: 0 % (ref 0.0–0.2)

## 2023-01-26 LAB — HEPATITIS B SURFACE ANTIGEN: Hepatitis B Surface Ag: NONREACTIVE

## 2023-01-26 LAB — TYPE AND SCREEN
ABO/RH(D): A POS
Antibody Screen: NEGATIVE

## 2023-01-26 LAB — APTT: aPTT: 31 seconds (ref 24–36)

## 2023-01-26 LAB — HIV ANTIBODY (ROUTINE TESTING W REFLEX): HIV Screen 4th Generation wRfx: NONREACTIVE

## 2023-01-26 SURGERY — AMPUTATION BELOW KNEE
Anesthesia: Monitor Anesthesia Care | Site: Knee | Laterality: Left

## 2023-01-26 MED ORDER — ORAL CARE MOUTH RINSE: 15.0000 mL | Freq: Once | OROMUCOSAL | Status: AC

## 2023-01-26 MED ORDER — DEXAMETHASONE SODIUM PHOSPHATE 10 MG/ML IJ SOLN
INTRAMUSCULAR | Status: AC
  Filled 2023-01-26: qty 2

## 2023-01-26 MED ORDER — SODIUM CHLORIDE 0.9 % IV SOLN: INTRAVENOUS | Status: DC

## 2023-01-26 MED ORDER — FENTANYL CITRATE (PF) 100 MCG/2ML IJ SOLN: 25.0000 ug | INTRAMUSCULAR | Status: DC | PRN

## 2023-01-26 MED ORDER — INSULIN ASPART 100 UNIT/ML IJ SOLN: 0.0000 [IU] | INTRAMUSCULAR | Status: DC | PRN

## 2023-01-26 MED ORDER — ACETAMINOPHEN 10 MG/ML IV SOLN: 1000.0000 mg | Freq: Once | INTRAVENOUS | Status: DC | PRN

## 2023-01-26 MED ORDER — RENA-VITE PO TABS
1.0000 | ORAL_TABLET | Freq: Every day | ORAL | Status: DC
  Administered 2023-01-27 – 2023-01-29 (×3): 1 via ORAL
  Filled 2023-01-26 (×3): qty 1

## 2023-01-26 MED ORDER — DOXERCALCIFEROL 4 MCG/2ML IV SOLN
2.0000 ug | INTRAVENOUS | Status: DC
  Administered 2023-01-27: 2 ug via INTRAVENOUS
  Filled 2023-01-26 (×2): qty 2

## 2023-01-26 MED ORDER — PHENYLEPHRINE 80 MCG/ML (10ML) SYRINGE FOR IV PUSH (FOR BLOOD PRESSURE SUPPORT)
PREFILLED_SYRINGE | INTRAVENOUS | Status: DC | PRN
  Administered 2023-01-26: 80 ug via INTRAVENOUS
  Administered 2023-01-26: 40 ug via INTRAVENOUS
  Administered 2023-01-26: 80 ug via INTRAVENOUS
  Administered 2023-01-26: 40 ug via INTRAVENOUS
  Administered 2023-01-26: 160 ug via INTRAVENOUS
  Administered 2023-01-26: 80 ug via INTRAVENOUS
  Administered 2023-01-26 (×2): 160 ug via INTRAVENOUS

## 2023-01-26 MED ORDER — MIDODRINE HCL 5 MG PO TABS
5.0000 mg | ORAL_TABLET | Freq: Every day | ORAL | Status: DC
  Administered 2023-01-27: 5 mg via ORAL
  Filled 2023-01-26: qty 1

## 2023-01-26 MED ORDER — PROPOFOL 500 MG/50ML IV EMUL
INTRAVENOUS | Status: DC | PRN
  Administered 2023-01-26: 100 ug/kg/min via INTRAVENOUS

## 2023-01-26 MED ORDER — BUPIVACAINE-EPINEPHRINE (PF) 0.5% -1:200000 IJ SOLN
INTRAMUSCULAR | Status: DC | PRN
  Administered 2023-01-26: 25 mL via PERINEURAL
  Administered 2023-01-26: 20 mL via PERINEURAL

## 2023-01-26 MED ORDER — 0.9 % SODIUM CHLORIDE (POUR BTL) OPTIME
TOPICAL | Status: DC | PRN
  Administered 2023-01-26: 1000 mL

## 2023-01-26 MED ORDER — TORSEMIDE 20 MG PO TABS
20.0000 mg | ORAL_TABLET | Freq: Every day | ORAL | Status: DC
  Administered 2023-01-27 – 2023-01-29 (×3): 20 mg via ORAL
  Filled 2023-01-26 (×3): qty 1

## 2023-01-26 MED ORDER — GLYCOPYRROLATE 0.2 MG/ML IJ SOLN
INTRAMUSCULAR | Status: DC | PRN
  Administered 2023-01-26 (×2): .1 mg via INTRAVENOUS

## 2023-01-26 MED ORDER — ONDANSETRON HCL 4 MG/2ML IJ SOLN
INTRAMUSCULAR | Status: DC | PRN
  Administered 2023-01-26: 4 mg via INTRAVENOUS

## 2023-01-26 MED ORDER — CHLORHEXIDINE GLUCONATE 0.12 % MT SOLN
15.0000 mL | Freq: Once | OROMUCOSAL | Status: AC
  Administered 2023-01-26: 15 mL via OROMUCOSAL

## 2023-01-26 MED ORDER — CHLORHEXIDINE GLUCONATE CLOTH 2 % EX PADS: 6.0000 | MEDICATED_PAD | Freq: Once | CUTANEOUS | Status: DC

## 2023-01-26 MED ORDER — POLYETHYLENE GLYCOL 3350 17 G PO PACK: 17.0000 g | PACK | Freq: Every day | ORAL | Status: DC | PRN

## 2023-01-26 MED ORDER — ACETAMINOPHEN 160 MG/5ML PO SOLN: 1000.0000 mg | Freq: Once | ORAL | Status: DC | PRN

## 2023-01-26 MED ORDER — CEFAZOLIN SODIUM-DEXTROSE 2-4 GM/100ML-% IV SOLN
2.0000 g | INTRAVENOUS | Status: AC
  Administered 2023-01-26: 2 g via INTRAVENOUS
  Filled 2023-01-26: qty 100

## 2023-01-26 MED ORDER — FENTANYL CITRATE (PF) 100 MCG/2ML IJ SOLN
INTRAMUSCULAR | Status: AC
  Administered 2023-01-26: 100 ug
  Filled 2023-01-26: qty 2

## 2023-01-26 MED ORDER — ACETAMINOPHEN 325 MG PO TABS: 650.0000 mg | ORAL_TABLET | Freq: Four times a day (QID) | ORAL | Status: DC | PRN

## 2023-01-26 MED ORDER — INSULIN GLARGINE-YFGN 100 UNIT/ML ~~LOC~~ SOLN
10.0000 [IU] | Freq: Two times a day (BID) | SUBCUTANEOUS | Status: DC
  Administered 2023-01-26 – 2023-01-29 (×6): 10 [IU] via SUBCUTANEOUS
  Filled 2023-01-26 (×7): qty 0.1

## 2023-01-26 MED ORDER — ONDANSETRON HCL 4 MG/2ML IJ SOLN
INTRAMUSCULAR | Status: AC
  Filled 2023-01-26: qty 4

## 2023-01-26 MED ORDER — ACETAMINOPHEN 500 MG PO TABS: 1000.0000 mg | ORAL_TABLET | Freq: Once | ORAL | Status: DC | PRN

## 2023-01-26 MED ORDER — HYDROMORPHONE HCL 1 MG/ML IJ SOLN: 0.5000 mg | INTRAMUSCULAR | Status: DC | PRN

## 2023-01-26 MED ORDER — CHLORHEXIDINE GLUCONATE 0.12 % MT SOLN
OROMUCOSAL | Status: AC
  Filled 2023-01-26: qty 15

## 2023-01-26 MED ORDER — ALBUTEROL SULFATE (2.5 MG/3ML) 0.083% IN NEBU: 2.5000 mg | INHALATION_SOLUTION | Freq: Four times a day (QID) | RESPIRATORY_TRACT | Status: DC | PRN

## 2023-01-26 MED ORDER — MIDAZOLAM HCL 2 MG/2ML IJ SOLN
1.0000 mg | Freq: Once | INTRAMUSCULAR | Status: AC
  Administered 2023-01-26: 1 mg via INTRAVENOUS

## 2023-01-26 MED ORDER — LIDOCAINE-EPINEPHRINE (PF) 1.5 %-1:200000 IJ SOLN
INTRAMUSCULAR | Status: DC | PRN
  Administered 2023-01-26 (×2): 5 mL via PERINEURAL

## 2023-01-26 MED ORDER — SODIUM CHLORIDE 0.9% FLUSH
3.0000 mL | Freq: Two times a day (BID) | INTRAVENOUS | Status: DC
  Administered 2023-01-26 – 2023-01-27 (×4): 3 mL via INTRAVENOUS

## 2023-01-26 MED ORDER — CLOPIDOGREL BISULFATE 75 MG PO TABS
75.0000 mg | ORAL_TABLET | Freq: Every day | ORAL | Status: DC
  Administered 2023-01-27 – 2023-01-29 (×3): 75 mg via ORAL
  Filled 2023-01-26 (×3): qty 1

## 2023-01-26 MED ORDER — HYDROCODONE-ACETAMINOPHEN 5-325 MG PO TABS
1.0000 | ORAL_TABLET | Freq: Four times a day (QID) | ORAL | Status: DC | PRN
  Administered 2023-01-27 – 2023-01-28 (×5): 2 via ORAL
  Filled 2023-01-26 (×5): qty 2

## 2023-01-26 MED ORDER — ATORVASTATIN CALCIUM 80 MG PO TABS
80.0000 mg | ORAL_TABLET | Freq: Every day | ORAL | Status: DC
  Administered 2023-01-27 – 2023-01-29 (×3): 80 mg via ORAL
  Filled 2023-01-26 (×3): qty 1

## 2023-01-26 MED ORDER — FLUTICASONE FUROATE-VILANTEROL 200-25 MCG/ACT IN AEPB
1.0000 | INHALATION_SPRAY | Freq: Every day | RESPIRATORY_TRACT | Status: DC
  Administered 2023-01-28 – 2023-01-29 (×2): 1 via RESPIRATORY_TRACT
  Filled 2023-01-26: qty 28

## 2023-01-26 MED ORDER — FENTANYL CITRATE PF 50 MCG/ML IJ SOSY: 50.0000 ug | PREFILLED_SYRINGE | Freq: Once | INTRAMUSCULAR | Status: DC

## 2023-01-26 MED ORDER — OXYCODONE HCL 5 MG PO TABS: 5.0000 mg | ORAL_TABLET | Freq: Once | ORAL | Status: DC | PRN

## 2023-01-26 MED ORDER — MIDAZOLAM HCL 2 MG/2ML IJ SOLN
INTRAMUSCULAR | Status: AC
  Filled 2023-01-26: qty 2

## 2023-01-26 MED ORDER — HEPARIN 6000 UNIT IRRIGATION SOLUTION
Status: AC
  Filled 2023-01-26: qty 500

## 2023-01-26 MED ORDER — EPHEDRINE SULFATE-NACL 50-0.9 MG/10ML-% IV SOSY
PREFILLED_SYRINGE | INTRAVENOUS | Status: DC | PRN
  Administered 2023-01-26: 7.5 mg via INTRAVENOUS
  Administered 2023-01-26 (×2): 5 mg via INTRAVENOUS
  Administered 2023-01-26: 2.5 mg via INTRAVENOUS
  Administered 2023-01-26: 5 mg via INTRAVENOUS

## 2023-01-26 MED ORDER — ACETAMINOPHEN 650 MG RE SUPP: 650.0000 mg | Freq: Four times a day (QID) | RECTAL | Status: DC | PRN

## 2023-01-26 MED ORDER — CHLORHEXIDINE GLUCONATE CLOTH 2 % EX PADS
6.0000 | MEDICATED_PAD | Freq: Every day | CUTANEOUS | Status: DC
  Administered 2023-01-27 – 2023-01-29 (×3): 6 via TOPICAL

## 2023-01-26 MED ORDER — METOPROLOL SUCCINATE ER 25 MG PO TB24
25.0000 mg | ORAL_TABLET | Freq: Every day | ORAL | Status: DC
  Administered 2023-01-29: 25 mg via ORAL
  Filled 2023-01-26 (×3): qty 1

## 2023-01-26 MED ORDER — INSULIN ASPART 100 UNIT/ML IJ SOLN
0.0000 [IU] | Freq: Three times a day (TID) | INTRAMUSCULAR | Status: DC
  Administered 2023-01-27 (×2): 1 [IU] via SUBCUTANEOUS

## 2023-01-26 MED ORDER — OXYCODONE HCL 5 MG/5ML PO SOLN: 5.0000 mg | Freq: Once | ORAL | Status: DC | PRN

## 2023-01-26 SURGICAL SUPPLY — 64 items
APL PRP STRL LF DISP 70% ISPRP (MISCELLANEOUS) ×2
BAG COUNTER SPONGE SURGICOUNT (BAG) ×2 IMPLANT
BAG SPNG CNTER NS LX DISP (BAG) ×2
BANDAGE ESMARK 6X9 LF (GAUZE/BANDAGES/DRESSINGS) IMPLANT
BLADE LONG MED 31X9 (MISCELLANEOUS) IMPLANT
BLADE SAGITTAL 25.0X1.19X90 (BLADE) ×2 IMPLANT
BLADE SURG 21 STRL SS (BLADE) ×2 IMPLANT
BNDG CMPR 9X6 STRL LF SNTH (GAUZE/BANDAGES/DRESSINGS) ×2
BNDG COHESIVE 6X5 TAN NS LF (GAUZE/BANDAGES/DRESSINGS) ×2 IMPLANT
BNDG ELASTIC 4X5.8 VLCR STR LF (GAUZE/BANDAGES/DRESSINGS) IMPLANT
BNDG ELASTIC 6X5.8 VLCR STR LF (GAUZE/BANDAGES/DRESSINGS) IMPLANT
BNDG ESMARK 6X9 LF (GAUZE/BANDAGES/DRESSINGS) ×2
BNDG GAUZE DERMACEA FLUFF 4 (GAUZE/BANDAGES/DRESSINGS) IMPLANT
BNDG GZE DERMACEA 4 6PLY (GAUZE/BANDAGES/DRESSINGS)
CANISTER SUCT 3000ML PPV (MISCELLANEOUS) ×2 IMPLANT
CANISTER WOUNDNEG PRESSURE 500 (CANNISTER) ×2 IMPLANT
CHLORAPREP W/TINT 26 (MISCELLANEOUS) ×2 IMPLANT
COVER SURGICAL LIGHT HANDLE (MISCELLANEOUS) ×2 IMPLANT
DRAIN RELI 100 BL SUC LF ST (DRAIN)
DRAPE DERMATAC (DRAPES) IMPLANT
DRAPE HALF SHEET 40X57 (DRAPES) ×2 IMPLANT
DRAPE INCISE IOBAN 66X45 STRL (DRAPES) IMPLANT
DRAPE ORTHO SPLIT 77X108 STRL (DRAPES) ×4
DRAPE SURG ORHT 6 SPLT 77X108 (DRAPES) ×4 IMPLANT
DRESSING PREVENA PLUS CUSTOM (GAUZE/BANDAGES/DRESSINGS) IMPLANT
DRILL BIT 2.0 (BIT) ×2 IMPLANT
DRSG PREVENA PLUS CUSTOM (GAUZE/BANDAGES/DRESSINGS) ×2
ELECT REM PT RETURN 9FT ADLT (ELECTROSURGICAL) ×2
ELECTRODE REM PT RTRN 9FT ADLT (ELECTROSURGICAL) ×2 IMPLANT
EVACUATOR SILICONE 100CC (DRAIN) IMPLANT
GAUZE SPONGE 4X4 12PLY STRL (GAUZE/BANDAGES/DRESSINGS) ×2 IMPLANT
GLOVE BIO SURGEON STRL SZ8 (GLOVE) ×2 IMPLANT
GOWN STRL REUS W/ TWL LRG LVL3 (GOWN DISPOSABLE) ×4 IMPLANT
GOWN STRL REUS W/ TWL XL LVL3 (GOWN DISPOSABLE) ×2 IMPLANT
GOWN STRL REUS W/TWL LRG LVL3 (GOWN DISPOSABLE) ×4
GOWN STRL REUS W/TWL XL LVL3 (GOWN DISPOSABLE) ×2
KIT BASIN OR (CUSTOM PROCEDURE TRAY) ×2 IMPLANT
KIT TURNOVER KIT B (KITS) ×2 IMPLANT
NS IRRIG 1000ML POUR BTL (IV SOLUTION) ×2 IMPLANT
PACK GENERAL/GYN (CUSTOM PROCEDURE TRAY) ×2 IMPLANT
PAD ARMBOARD 7.5X6 YLW CONV (MISCELLANEOUS) ×4 IMPLANT
PREVENA RESTOR ARTHOFORM 33X30 (CANNISTER) ×2 IMPLANT
PREVENA RESTOR AXIOFORM 29X28 (GAUZE/BANDAGES/DRESSINGS) IMPLANT
SPONGE T-LAP 18X18 ~~LOC~~+RFID (SPONGE) IMPLANT
STAPLER SKIN 35 REG (STAPLE) ×2 IMPLANT
STAPLER VISISTAT 35W (STAPLE) ×2 IMPLANT
STOCKINETTE IMPERVIOUS LG (DRAPES) ×2 IMPLANT
SUT BONE WAX W31G (SUTURE) IMPLANT
SUT ETHIBOND 0 36 GRN (SUTURE) ×2 IMPLANT
SUT ETHILON 2 0 PSLX (SUTURE) IMPLANT
SUT ETHILON 3 0 PS 1 (SUTURE) IMPLANT
SUT SILK 0 TIES 10X30 (SUTURE) ×2 IMPLANT
SUT SILK 2 0 (SUTURE)
SUT SILK 2 0 SH CR/8 (SUTURE) ×2 IMPLANT
SUT SILK 2-0 18XBRD TIE 12 (SUTURE) IMPLANT
SUT SILK 3 0 (SUTURE)
SUT SILK 3-0 18XBRD TIE 12 (SUTURE) IMPLANT
SUT VIC AB 0 CT1 18XCR BRD 8 (SUTURE) ×4 IMPLANT
SUT VIC AB 0 CT1 8-18 (SUTURE) ×2
SUT VIC AB 2-0 CT1 18 (SUTURE) ×4 IMPLANT
SUT VIC AB 3-0 SH 8-18 (SUTURE) IMPLANT
TOWEL GREEN STERILE (TOWEL DISPOSABLE) ×4 IMPLANT
UNDERPAD 30X36 HEAVY ABSORB (UNDERPADS AND DIAPERS) ×2 IMPLANT
WATER STERILE IRR 1000ML POUR (IV SOLUTION) ×2 IMPLANT

## 2023-01-26 NOTE — Op Note (Signed)
DATE OF SERVICE: 01/26/2023  PATIENT:  [name] [name]  67 y.o. male  PRE-OPERATIVE DIAGNOSIS:  left diabetic foot infection  POST-OPERATIVE DIAGNOSIS:  Same  PROCEDURE:   Left below knee amputation  SURGEON:  Surgeons and Role:    * Leonie Deonta, MD - Primary  ASSISTANT: Loel Dubonnet, PA-C  An experienced assistant was required given the complexity of this procedure and the standard of surgical care. My assistant helped with exposure through counter tension, suctioning, ligation and retraction to better visualize the surgical field.  My assistant expedited sewing during the case by following my sutures. Wherever I use the term "we" in the report, my assistant actively helped me with that portion of the procedure.  ANESTHESIA:   general  EBL:  BLOOD ADMINISTERED:none  DRAINS: none   LOCAL MEDICATIONS USED:  NONE  SPECIMEN:  residual left leg  COUNTS: confirmed correct .  TOURNIQUET:    Total Tourniquet Time Documented: Thigh (Left) - 8 minutes Total: Thigh (Left) - 8 minutes   PATIENT DISPOSITION:  PACU - hemodynamically stable.   Delay start of Pharmacological VTE agent (>24hrs) due to surgical blood loss or risk of bleeding: no  INDICATION FOR PROCEDURE: [name] is a 67 y.o. male with left diabetic foot infection with non healing toe amputation. He has no options for vascular reconstruction. After careful discussion of risks, benefits, and alternatives the patient was offered left below knee amputation. The patient understood and wished to proceed.  OPERATIVE FINDINGS: bulky musculature of calf required extensive debridement to get adequate closure / flap coverage. VAC / Duda sock applied.  DESCRIPTION OF PROCEDURE: After identification of the patient in the pre-operative holding area, the patient was transferred to the operating room. The patient was positioned supine on the operating room table. Anesthesia was induced. The left leg was prepped and draped  in standard fashion. A surgical pause was performed confirming correct patient, procedure, and operative location.  A tourniquet was placed on the left thigh. The skin of the leg was marked to plan the anterior flap 10 cm distal to the tibial tuberosity. The anterior flap measured two thirds the circumference of the calf. The posterior flap was measured to be one third of the circumference of the calf in length.   The leg was exsanguinated with an Esmarch tourniquet. The pneumatic tourniquet was inflated to 250 mm Hg. The flaps were created using pre-made marks using a 21 blade. The incision was carried down through subcutaneous tissue, fascia, and muscle anteriorly. The periosteal tissue was elevated anteriorly so that the tibia was about 3 cm shorter than the anterior skin flap.  The tibia was transected with a power saw. An anterior wedge was created with the power saw.  Then I smoothed out the rough edges with a rasp.  In a similar fashion, I cut back the fibula about two centimeters higher than the level of the tibia with an angled bone cutter.  The posterior flap was completed with an amputation knife.  The specimen was passed off the field. All visible arteries and veins were clamped and ligated using silk suture.  The tourniquet was deflated.  Hemostasis was achieved in the flaps using electrocautery and silk suture.  The stump was washed with sterile normal saline and no further active bleeding was noted.    A myodesis was created using a powered drill to create a small hole through the anterior cortex of the tibia.  A #5 FiberWire was delivered through the  osteotomy and through the aponeurosis of the gastrocnemius.  I reapproximated the anterior and posterior fascia  with interrupted stitches of 2-0 Vicryl.  This was completed along the entire length of anterior and posterior fascia until there was no more loose space in the fascial line.  The skin was then reapproximated with staples.  The stump  was washed off and dried.  A Prevena VAC system was applied to the stump.  This was secured with derma tack and Ioban.  A good seal was achieved.  A stump shrinker was applied.  A stump protector was applied.  Upon completion of the case instrument and sharps counts were confirmed correct. The patient was transferred to the PACU in good condition. I was present for all portions of the procedure.   Upon completion of the case instrument and sharps counts were confirmed correct. The patient was transferred to the PACU in good condition. I was present for all portions of the procedure.  FOLLOW UP PLAN: Assuming a normal postoperative course, VVS PA will see the patient in 2-3 weeks with no studies.   Rande Brunt. Lenell Antu, MD [facility] Phone Number: [phone] 01/26/2023 2:16 PM

## 2023-01-26 NOTE — Plan of Care (Signed)
  Problem: Education: Goal: Understanding of CV disease, CV risk reduction, and recovery process will improve Outcome: Progressing Goal: Individualized Educational Video(s) Outcome: Progressing   Problem: Activity: Goal: Ability to return to baseline activity level will improve Outcome: Progressing   Problem: Cardiovascular: Goal: Ability to achieve and maintain adequate cardiovascular perfusion will improve Outcome: Progressing Goal: Vascular access site(s) Level 0-1 will be maintained Outcome: Progressing   Problem: Health Behavior/Discharge Planning: Goal: Ability to safely manage health-related needs after discharge will improve Outcome: Progressing   Problem: Coping: Goal: Ability to adjust to condition or change in health will improve Outcome: Progressing   Problem: Health Behavior/Discharge Planning: Goal: Ability to identify and utilize available resources and services will improve Outcome: Progressing Goal: Ability to manage health-related needs will improve Outcome: Progressing   Problem: Fluid Volume: Goal: Ability to maintain a balanced intake and output will improve Outcome: Progressing

## 2023-01-26 NOTE — Transfer of Care (Signed)
Immediate Anesthesia Transfer of Care Note  Patient: [name] [name]  Procedure(s) Performed: LEFT AMPUTATION BELOW KNEE (Left: Knee) APPLICATION OF WOUND VAC, LEFT BELOW THE KNEE AMPUTATION STUMP (Left)  Patient Location: PACU  Anesthesia Type:MAC  Level of Consciousness: awake, alert , and oriented  Airway & Oxygen Therapy: Patient Spontanous Breathing and Patient connected to face mask oxygen  Post-op Assessment: Report given to RN and Post -op Vital signs reviewed and stable  Post vital signs: Reviewed and stable  Last Vitals:  Vitals Value Taken Time  BP    Temp    Pulse    Resp    SpO2      Last Pain:  Vitals:   01/26/23 1015  TempSrc:   PainSc: 4       Patients Stated Pain Goal: 3 (01/26/23 1015)  Complications: No notable events documented.

## 2023-01-26 NOTE — Interval H&P Note (Signed)
History and Physical Interval Note:  01/26/2023 11:59 AM  [name] [name]  has presented today for surgery, with the diagnosis of critical limb ishemia.  The various methods of treatment have been discussed with the patient and family. After consideration of risks, benefits and other options for treatment, the patient has consented to  Procedure(s): LEFT AMPUTATION BELOW KNEE (Left) APPLICATION OF WOUND VAC (Left) as a surgical intervention.  The patient's history has been reviewed, patient examined, no change in status, stable for surgery.  I have reviewed the patient's chart and labs.  Questions were answered to the patient's satisfaction.     [name]

## 2023-01-26 NOTE — Anesthesia Procedure Notes (Signed)
Anesthesia Regional Block: Femoral nerve block   Pre-Anesthetic Checklist: , timeout performed,  Correct Patient, Correct Site, Correct Laterality,  Correct Procedure, Correct Position, site marked,  Risks and benefits discussed,  Surgical consent,  Pre-op evaluation,  At surgeon's request and post-op pain management  Laterality: Left and Lower  Prep: chloraprep       Needles:  Injection technique: Single-shot      Needle Length: 9cm  Needle Gauge: 22     Additional Needles: Arrow StimuQuik ECHO Echogenic Stimulating PNB Needle  Procedures:,,,, ultrasound used (permanent image in chart),,    Narrative:  Start time: 01/26/2023 11:58 AM End time: 01/26/2023 12:02 PM Injection made incrementally with aspirations every 5 mL.  Performed by: Personally  Anesthesiologist: Val Eagle, MD

## 2023-01-26 NOTE — Anesthesia Procedure Notes (Signed)
Anesthesia Regional Block: Popliteal block   Pre-Anesthetic Checklist: , timeout performed,  Correct Patient, Correct Site, Correct Laterality,  Correct Procedure, Correct Position, site marked,  Risks and benefits discussed,  Surgical consent,  Pre-op evaluation,  At surgeon's request and post-op pain management  Laterality: Left and Lower  Prep: chloraprep       Needles:  Injection technique: Single-shot      Needle Length: 9cm  Needle Gauge: 22     Additional Needles: Arrow StimuQuik ECHO Echogenic Stimulating PNB Needle  Procedures:,,,, ultrasound used (permanent image in chart),,    Narrative:  Start time: 01/26/2023 12:03 PM End time: 01/26/2023 12:07 PM Injection made incrementally with aspirations every 5 mL.  Performed by: Personally  Anesthesiologist: Val Eagle, MD

## 2023-01-26 NOTE — Consult Note (Signed)
[facility] Renal Consultation Note    Indication for Consultation:  Management of ESRD/hemodialysis; anemia, hypertension/volume and secondary hyperparathyroidism  GNF:[id number], [name], PA-C  HPI: [name] [name] is a 67 y.o. male with ESRD on HD only twice weekly at [facility]. He has a past medical history significant for PAD, diabetes, hypertension, hyperlipidemia, atrial fibrillation, CAD status post CABG, obesity, OSA, anemia, ESRD on HD, neuropathy, CHF presenting with left lower extremity ischemia. Noted patient with known PAD and left limb ischemia with prior atherectomy and stenting. Nephrology consulted for his ongoing dialysis management.  Patient underwent L BKA today by Dr. Lenell Antu. Seen and examined patient at bedside. He reports tolerating the procedure well. He denies SOB, CP, palpitations, and N/V. Noted wound vac applied to left stump. His last hemodialysis was on 8/3 where he received full treatment. Labs today include: Na 136, K+ 3.4, BUN 67, SrCr 7.31, and Hgb 8.1. No emergent need for hemodialysis tonight as his volume appears stable. Plan for hemodialysis tomorrow morning first case.  Past Medical History:  Diagnosis Date   Acute kidney failure, unspecified (HCC) 12/05/2021   Anemia 04/17/2022   Blood transfusion   Atherosclerotic heart disease    Benign essential HTN 07/30/2014   Bradycardia 01/25/2015   CHF (congestive heart failure) (HCC)    Chronic anticoagulation 02/25/2018   Chronic systolic CHF (congestive heart failure), NYHA class 3 (HCC) 08/30/2014   Overview:  Global ef 30%   CKD (chronic kidney disease)    Class 3 severe obesity in adult [facility]) 04/28/2017   Coronary artery disease    Diabetes mellitus without complication (HCC)    Diabetic neuropathy (HCC) 07/28/2014   Dysrhythmia    Erectile dysfunction    GERD (gastroesophageal reflux disease)    High risk medication use 05/26/2018   Hypertensive heart disease with  heart failure (HCC) 07/30/2014   Leukocytosis 11/26/2021   Lumbago    LV dysfunction 04/28/2017   Mixed hyperlipidemia 07/28/2014   OSA (obstructive sleep apnea)    CPAP   Paroxysmal atrial fibrillation (HCC) 07/28/2014   Postop check 01/06/2022   S/P CABG x 3 11/12/2021   Sinus node dysfunction (HCC) 04/21/2016   Testicular hypofunction    Uncontrolled type 2 diabetes mellitus with microalbuminuric diabetic nephropathy 07/28/2014   Past Surgical History:  Procedure Laterality Date   ABDOMINAL AORTOGRAM W/LOWER EXTREMITY N/A 12/29/2022   Procedure: ABDOMINAL AORTOGRAM W/LOWER EXTREMITY;  Surgeon: Maeola Harman, MD;  Location: [facility] INVASIVE CV LAB;  Service: Cardiovascular;  Laterality: N/A;   ABDOMINAL AORTOGRAM W/LOWER EXTREMITY N/A 01/22/2023   Procedure: ABDOMINAL AORTOGRAM W/LOWER EXTREMITY;  Surgeon: [name], MD;  Location: [facility] INVASIVE CV LAB;  Service: Cardiovascular;  Laterality: N/A;   AV FISTULA PLACEMENT Right 05/20/2022   Procedure: RIGHT ARM ARTERIOVENOUS (AV) FISTULA CREATION;  Surgeon: Larina Earthly, MD;  Location: [facility] OR;  Service: Vascular;  Laterality: Right;   BUBBLE STUDY  11/21/2021   Procedure: BUBBLE STUDY;  Surgeon: Laurey Morale, MD;  Location: [facility] ENDOSCOPY;  Service: Cardiovascular;;   CARDIAC CATHETERIZATION     CARDIOVERSION N/A 11/21/2021   Procedure: CARDIOVERSION;  Surgeon: Laurey Morale, MD;  Location: [facility] ENDOSCOPY;  Service: Cardiovascular;  Laterality: N/A;   CARDIOVERSION N/A 11/25/2021   Procedure: CARDIOVERSION;  Surgeon: Laurey Morale, MD;  Location: [facility] ENDOSCOPY;  Service: Cardiovascular;  Laterality: N/A;   CARDIOVERSION N/A 11/29/2021   Procedure: CARDIOVERSION;  Surgeon: Laurey Morale, MD;  Location: [facility] ENDOSCOPY;  Service: Cardiovascular;  Laterality: N/A;   CARDIOVERSION N/A 02/27/2022   Procedure: CARDIOVERSION;  Surgeon: Little Ishikawa, MD;  Location: [facility] ENDOSCOPY;  Service: Cardiovascular;   Laterality: N/A;   CORONARY ARTERY BYPASS GRAFT N/A 11/12/2021   Procedure: CORONARY ARTERY BYPASS GRAFTING (CABG) x  3 USING LEFT INTERNAL MAMMARY ARTERY AND RIGHT GREATER SAPHENOUS VEIN;  Surgeon: Lovett Sox, MD;  Location: [facility] OR;  Service: Open Heart Surgery;  Laterality: N/A;   ELECTROPHYSIOLOGIC STUDY N/A 01/18/2015   Procedure: CARDIOVERSION;  Surgeon: Lamar Blinks, MD;  Location: [facility] ORS;  Service: Cardiovascular;  Laterality: N/A;   ENDOVEIN HARVEST OF GREATER SAPHENOUS VEIN  11/12/2021   Procedure: ENDOVEIN HARVEST OF GREATER SAPHENOUS VEIN;  Surgeon: Lovett Sox, MD;  Location: [facility] OR;  Service: Open Heart Surgery;;   EYE SURGERY Bilateral 2022   FISTULA SUPERFICIALIZATION Right 07/10/2022   Procedure: RIGHT ARM ARTERIOVENOUS FISTULA SUPERFICIALIZATION;  Surgeon: Maeola Harman, MD;  Location: [facility] OR;  Service: Vascular;  Laterality: Right;   IR FLUORO GUIDE CV LINE RIGHT  11/28/2021   IR US GUIDE VASC ACCESS RIGHT  11/28/2021   LEFT HEART CATH AND CORONARY ANGIOGRAPHY N/A 10/24/2021   Procedure: LEFT HEART CATH AND CORONARY ANGIOGRAPHY;  Surgeon: Corky Crafts, MD;  Location: [facility] INVASIVE CV LAB;  Service: Cardiovascular;  Laterality: N/A;   MAZE N/A 11/12/2021   Procedure: MAZE;  Surgeon: Lovett Sox, MD;  Location: [facility] OR;  Service: Open Heart Surgery;  Laterality: N/A;   PERIPHERAL VASCULAR ATHERECTOMY  12/29/2022   Procedure: PERIPHERAL VASCULAR ATHERECTOMY;  Surgeon: Maeola Harman, MD;  Location: [facility] INVASIVE CV LAB;  Service: Cardiovascular;;   PERIPHERAL VASCULAR INTERVENTION  12/29/2022   Procedure: PERIPHERAL VASCULAR INTERVENTION;  Surgeon: Maeola Harman, MD;  Location: [facility] INVASIVE CV LAB;  Service: Cardiovascular;;  Left SFA, Bilateral illiac   TEE WITHOUT CARDIOVERSION N/A 11/12/2021   Procedure: TRANSESOPHAGEAL ECHOCARDIOGRAM (TEE);  Surgeon: Lovett Sox, MD;  Location: [facility] OR;  Service: Open Heart Surgery;  Laterality:  N/A;   TEE WITHOUT CARDIOVERSION N/A 11/21/2021   Procedure: TRANSESOPHAGEAL ECHOCARDIOGRAM (TEE);  Surgeon: Laurey Morale, MD;  Location: [facility] ENDOSCOPY;  Service: Cardiovascular;  Laterality: N/A;   Family History  Problem Relation Age of Onset   CAD Brother    Drug abuse Brother    Lung cancer Father    CAD Mother    Social History:  reports that he quit smoking about 17 years ago. His smoking use included cigarettes. He has never used smokeless tobacco. He reports that he does not currently use drugs after having used the following drugs: Marijuana. Frequency: 1.00 time per week. He reports that he does not drink alcohol. No Known Allergies Prior to Admission medications   Medication Sig Start Date End Date Taking? Authorizing Provider  albuterol (VENTOLIN HFA) 108 (90 Base) MCG/ACT inhaler Inhale 2 puffs into the lungs every 6 (six) hours as needed for wheezing or shortness of breath.   Yes [provider]  amiodarone (PACERONE) 200 MG tablet Take 1 tablet (200 mg total) by mouth 2 (two) times daily. 03/17/22  Yes Baldo Daub, MD  atorvastatin (LIPITOR) 80 MG tablet Take 1 tablet (80 mg total) by mouth daily. 11/18/22  Yes Baldo Daub, MD  B Complex-C-Folic Acid (DIALYVITE 800) 0.8 MG TABS Take 0.8 mg by mouth daily. 12/16/21  Yes [provider]  clopidogrel (PLAVIX) 75 MG tablet Take 1 tablet (75 mg total) by mouth daily. 12/29/22 12/29/23 Yes Maeola Harman, MD  Dulaglutide (TRULICITY) 3 MG/0.5ML SOPN Inject 3 mg into the skin every Wednesday.   Yes [provider]  ELIQUIS 5 MG TABS tablet TAKE 1 TABLET TWICE DAILY 01/05/23  Yes Baldo Daub, MD  fluticasone furoate-vilanterol (BREO ELLIPTA) 200-25 MCG/ACT AEPB Inhale 1 puff into the lungs daily. 01/02/18  Yes [provider]  Insulin Pen Needle 32G X 4 MM MISC Use to inject Levemir 2 (two) times daily. 12/03/21  Yes Doree Fudge M, PA-C  metoprolol succinate (TOPROL-XL) 25 MG  24 hr tablet Take 25 mg by mouth daily. 12/26/21  Yes [provider]  midodrine (PROAMATINE) 5 MG tablet Take 1 tablet (5 mg total) by mouth daily. 08/19/22  Yes Baldo Daub, MD  torsemide (DEMADEX) 20 MG tablet Take 1 tablet (20 mg total) by mouth daily. 01/05/23  Yes Baldo Daub, MD  HYDROcodone-acetaminophen (NORCO) 10-325 MG tablet Take 1 tablet by mouth 3 (three) times daily as needed for moderate pain or severe pain. 01/06/23   [provider]  insulin glargine (LANTUS) 100 UNIT/ML injection Inject 38 Units into the skin 2 (two) times daily.    [provider]  Methoxy PEG-Epoetin Beta (MIRCERA IJ) Mircera 12/12/22 12/11/23  [provider]  promethazine (PHENERGAN) 12.5 MG tablet Take 12.5 mg by mouth 3 (three) times daily as needed. 01/05/23   [provider]  traMADol (ULTRAM) 50 MG tablet Take 1 tablet (50 mg total) by mouth every 8 (eight) hours as needed. 07/10/22 07/10/23  Dara Lords, PA-C   Current Facility-Administered Medications  Medication Dose Route Frequency Provider Last Rate Last Admin   acetaminophen (TYLENOL) tablet 650 mg  650 mg Oral Q6H PRN Synetta Fail, MD       Or   acetaminophen (TYLENOL) suppository 650 mg  650 mg Rectal Q6H PRN Synetta Fail, MD       albuterol (PROVENTIL) (2.5 MG/3ML) 0.083% nebulizer solution 2.5 mg  2.5 mg Inhalation Q6H PRN Synetta Fail, MD       [START ON 01/27/2023] atorvastatin (LIPITOR) tablet 80 mg  80 mg Oral Daily Synetta Fail, MD       chlorhexidine (PERIDEX) 0.12 % solution            [START ON 01/27/2023] clopidogrel (PLAVIX) tablet 75 mg  75 mg Oral Daily Synetta Fail, MD       fentaNYL (SUBLIMAZE) injection 50 mcg  50 mcg Intravenous Once Synetta Fail, MD       [START ON 01/27/2023] fluticasone furoate-vilanterol (BREO ELLIPTA) 200-25 MCG/ACT 1 puff  1 puff Inhalation Daily Synetta Fail, MD       HYDROcodone-acetaminophen (NORCO/VICODIN)  5-325 MG per tablet 1-2 tablet  1-2 tablet Oral Q6H PRN Synetta Fail, MD       HYDROmorphone (DILAUDID) injection 0.5-1 mg  0.5-1 mg Intravenous Q4H PRN Synetta Fail, MD       insulin aspart (novoLOG) injection 0-6 Units  0-6 Units Subcutaneous TID WC Synetta Fail, MD       insulin glargine-yfgn [facility]) injection 10 Units  10 Units Subcutaneous BID Synetta Fail, MD       [START ON 01/27/2023] metoprolol succinate (TOPROL-XL) 24 hr tablet 25 mg  25 mg Oral Daily Synetta Fail, MD       midazolam (VERSED) 2 MG/2ML injection            [START ON 01/27/2023] midodrine (PROAMATINE) tablet 5 mg  5 mg  Oral Daily Synetta Fail, MD       [START ON 01/27/2023] multivitamin (RENA-VIT) tablet 1 tablet  1 tablet Oral Daily Synetta Fail, MD       polyethylene glycol (MIRALAX / GLYCOLAX) packet 17 g  17 g Oral Daily PRN Synetta Fail, MD       sodium chloride flush (NS) 0.9 % injection 3 mL  3 mL Intravenous Q12H Synetta Fail, MD   3 mL at 01/26/23 1649   [START ON 01/27/2023] torsemide (DEMADEX) tablet 20 mg  20 mg Oral Daily Synetta Fail, MD       Labs: Basic Metabolic Panel: Recent Labs  Lab 01/22/23 1125 01/26/23 1055  NA 136 136  K 4.7 3.4*  CL 102 100  CO2  --  23  GLUCOSE 111* 138*  BUN 81* 67*  CREATININE 5.80* 7.31*  CALCIUM  --  6.7*   Liver Function Tests: Recent Labs  Lab 01/26/23 1055  AST 83*  ALT 84*  ALKPHOS 74  BILITOT 0.6  PROT 5.9*  ALBUMIN 2.1*   No results for input(s): "LIPASE", "AMYLASE" in the last 168 hours. No results for input(s): "AMMONIA" in the last 168 hours. CBC: Recent Labs  Lab 01/22/23 1125 01/26/23 1041  WBC  --  18.5*  HGB 9.2* 8.1*  HCT 27.0* 26.3*  MCV  --  90.4  PLT  --  451*   Cardiac Enzymes: No results for input(s): "CKTOTAL", "CKMB", "CKMBINDEX", "TROPONINI" in the last 168 hours. CBG: Recent Labs  Lab 01/22/23 1723 01/26/23 0941 01/26/23 1150 01/26/23 1426  01/26/23 1604  GLUCAP 75 152* 131* 141* 124*   Iron Studies: No results for input(s): "IRON", "TIBC", "TRANSFERRIN", "FERRITIN" in the last 72 hours. Studies/Results: PERIPHERAL VASCULAR CATHETERIZATION  Result Date: 01/26/2023 See surgical note for result.   ROS: All others negative except those listed in HPI.   Physical Exam: Vitals:   01/26/23 1430 01/26/23 1456 01/26/23 1504 01/26/23 1605  BP: (!) 119/51 (!) 124/50 (!) 124/50 (!) 119/41  Pulse: 65 60    Resp: 20 20  20   Temp: 97.8 F (36.6 C) 97.8 F (36.6 C)  (!) 97.5 F (36.4 C)  TempSrc:  Oral  Oral  SpO2: 99%     Weight:      Height:         General: Awake, Alert, on RA, NAD Head: Sclera anicteric Lungs: CTA bilaterally. No wheeze, rales or rhonchi. Breathing is unlabored. Heart: RRR. No murmur, rubs or gallops.  Abdomen: soft, nontender Lower extremities: L BKA; no edema left stump-wound vac applied; No edema RLE Neuro: AAOx3.  Dialysis Access: R AVF (+) B/T   Dialysis Orders:  Monday/Friday - [facility] 3.5hrs, BFR 400, DFR 800,  EDW 113.6kg, 3K/ 2.5Ca No Heparin Mircera 150 mcg q2wks - last 01/24/23 Hectorol with HD - last dose on 01/24/23  Assessment/Plan: LLE ischemia - with known PAD and limb ischemia with prior atherectomy and stenting. S/p L BKA today by Dr. Lenell Antu. ESRD -  on HD twice weekly Monday and Fridays. Last HD on 8/3. No emergent need for HD tonight. Plan for HD tomorrow morning first case. Hypertension/volume  - Blood pressure and volume remain stable. Will need to adjust EDW at discharge given recent BKA. Noted he's on PO Midodrine and Metoprolol in outpatient? Will monitor trend closely and adjust medications if needed. Anemia of CKD - Hgb 8.1. Checking labs in AM. Last ESA given on 8/3  in outpatient so not due yet. Transfuse for Hgb < 7. Secondary Hyperparathyroidism -  Continue Hectorol. Adding phos in AM. Nutrition - Continue renal diet and fluid  restriction.  Salome Holmes, NP [facility] 01/26/2023, 5:42 PM

## 2023-01-26 NOTE — H&P (Signed)
History and Physical   Binnie Living WUJ:[id number] DOB: [birth year] DOA: 01/26/2023  PCP: [name], PA-C   Patient coming from: Home  Chief Complaint Ischemic LLE  HPI: [name] [name] is a 67 y.o. male with medical history significant of PAD, diabetes, hypertension, hyperlipidemia, atrial fibrillation, CAD status post CABG, obesity, OSA, anemia, ESRD on HD, neuropathy, CHF presenting with left lower extremity ischemia.  Patient has known history of PAD and had been status post aortogram, laser atherectomy, stenting of SFA and popliteal arteries in July of this year.  He subsequently had continued ischemia and a toe ulcer and had a great toe amputation.  This amputation site has been nonhealing and has been following up with vascular surgery and it was determined that the wound was nonsalvageable.  Plan was made for angiography and likely more extensive amputation.  Presented today for further evaluation after agreement with vascular surgery for BKA.  BKA has been performed successfully by vascular surgery and hospital service has been asked to admit with vascular surgery continuing to follow.  Patient reports feeling okay, but ready to eat.  Patient denies fevers, chills, chest pain, shortness of breath, abdominal pain, constipation, diarrhea, nausea, vomiting.  Course: Vital signs notable for blood pressure in the 130s to 140s systolic, heart rate in the 50s.  Lab workup included CMP with potassium 3.4, BUN 276, creatinine stable at 7.71, glucose 120, calcium 6.7, protein 5.9, albumin 2.1 course:, AST 83, ALT 84.  CBC with hemoglobin near baseline at 8.1, leukocytosis to 18.5, platelets 451.  PT, PTT, INR pending.  Urinalysis pending.  Patient has been typed and screened.  In the PACU patient was ordered.  Tylenol, oxycodone, fentanyl.  Received perioperative Ancef and a dose of Versed.  Patient was on sliding scale insulin in the PACU as well.  Underwent successful left BKA see op note for  details.  Review of Systems: As per HPI otherwise all other systems reviewed and are negative.  Past Medical History:  Diagnosis Date   Acute kidney failure, unspecified (HCC) 12/05/2021   Anemia 04/17/2022   Blood transfusion   Atherosclerotic heart disease    Benign essential HTN 07/30/2014   Bradycardia 01/25/2015   CHF (congestive heart failure) (HCC)    Chronic anticoagulation 02/25/2018   Chronic systolic CHF (congestive heart failure), NYHA class 3 (HCC) 08/30/2014   Overview:  Global ef 30%   CKD (chronic kidney disease)    Class 3 severe obesity in adult [facility]) 04/28/2017   Coronary artery disease    Diabetes mellitus without complication (HCC)    Diabetic neuropathy (HCC) 07/28/2014   Dysrhythmia    Erectile dysfunction    GERD (gastroesophageal reflux disease)    High risk medication use 05/26/2018   Hypertensive heart disease with heart failure (HCC) 07/30/2014   Leukocytosis 11/26/2021   Lumbago    LV dysfunction 04/28/2017   Mixed hyperlipidemia 07/28/2014   OSA (obstructive sleep apnea)    CPAP   Paroxysmal atrial fibrillation (HCC) 07/28/2014   Postop check 01/06/2022   S/P CABG x 3 11/12/2021   Sinus node dysfunction (HCC) 04/21/2016   Testicular hypofunction    Uncontrolled type 2 diabetes mellitus with microalbuminuric diabetic nephropathy 07/28/2014    Past Surgical History:  Procedure Laterality Date   ABDOMINAL AORTOGRAM W/LOWER EXTREMITY N/A 12/29/2022   Procedure: ABDOMINAL AORTOGRAM W/LOWER EXTREMITY;  Surgeon: Maeola Harman, MD;  Location: [facility] INVASIVE CV LAB;  Service: Cardiovascular;  Laterality: N/A;   ABDOMINAL AORTOGRAM W/LOWER EXTREMITY N/A 01/22/2023  Procedure: ABDOMINAL AORTOGRAM W/LOWER EXTREMITY;  Surgeon: [name], MD;  Location: [facility] INVASIVE CV LAB;  Service: Cardiovascular;  Laterality: N/A;   AV FISTULA PLACEMENT Right 05/20/2022   Procedure: RIGHT ARM ARTERIOVENOUS (AV) FISTULA CREATION;  Surgeon: Larina Earthly, MD;  Location: [facility] OR;  Service: Vascular;  Laterality: Right;   BUBBLE STUDY  11/21/2021   Procedure: BUBBLE STUDY;  Surgeon: Laurey Morale, MD;  Location: [facility] ENDOSCOPY;  Service: Cardiovascular;;   CARDIAC CATHETERIZATION     CARDIOVERSION N/A 11/21/2021   Procedure: CARDIOVERSION;  Surgeon: Laurey Morale, MD;  Location: [facility] ENDOSCOPY;  Service: Cardiovascular;  Laterality: N/A;   CARDIOVERSION N/A 11/25/2021   Procedure: CARDIOVERSION;  Surgeon: Laurey Morale, MD;  Location: [facility] ENDOSCOPY;  Service: Cardiovascular;  Laterality: N/A;   CARDIOVERSION N/A 11/29/2021   Procedure: CARDIOVERSION;  Surgeon: Laurey Morale, MD;  Location: [facility] ENDOSCOPY;  Service: Cardiovascular;  Laterality: N/A;   CARDIOVERSION N/A 02/27/2022   Procedure: CARDIOVERSION;  Surgeon: Little Ishikawa, MD;  Location: [facility] ENDOSCOPY;  Service: Cardiovascular;  Laterality: N/A;   CORONARY ARTERY BYPASS GRAFT N/A 11/12/2021   Procedure: CORONARY ARTERY BYPASS GRAFTING (CABG) x  3 USING LEFT INTERNAL MAMMARY ARTERY AND RIGHT GREATER SAPHENOUS VEIN;  Surgeon: Lovett Sox, MD;  Location: [facility] OR;  Service: Open Heart Surgery;  Laterality: N/A;   ELECTROPHYSIOLOGIC STUDY N/A 01/18/2015   Procedure: CARDIOVERSION;  Surgeon: Lamar Blinks, MD;  Location: [facility] ORS;  Service: Cardiovascular;  Laterality: N/A;   ENDOVEIN HARVEST OF GREATER SAPHENOUS VEIN  11/12/2021   Procedure: ENDOVEIN HARVEST OF GREATER SAPHENOUS VEIN;  Surgeon: Lovett Sox, MD;  Location: [facility] OR;  Service: Open Heart Surgery;;   EYE SURGERY Bilateral 2022   FISTULA SUPERFICIALIZATION Right 07/10/2022   Procedure: RIGHT ARM ARTERIOVENOUS FISTULA SUPERFICIALIZATION;  Surgeon: Maeola Harman, MD;  Location: [facility] OR;  Service: Vascular;  Laterality: Right;   IR FLUORO GUIDE CV LINE RIGHT  11/28/2021   IR US GUIDE VASC ACCESS RIGHT  11/28/2021   LEFT HEART CATH AND CORONARY ANGIOGRAPHY N/A 10/24/2021   Procedure: LEFT HEART CATH AND  CORONARY ANGIOGRAPHY;  Surgeon: Corky Crafts, MD;  Location: [facility] INVASIVE CV LAB;  Service: Cardiovascular;  Laterality: N/A;   MAZE N/A 11/12/2021   Procedure: MAZE;  Surgeon: Lovett Sox, MD;  Location: [facility] OR;  Service: Open Heart Surgery;  Laterality: N/A;   PERIPHERAL VASCULAR ATHERECTOMY  12/29/2022   Procedure: PERIPHERAL VASCULAR ATHERECTOMY;  Surgeon: Maeola Harman, MD;  Location: [facility] INVASIVE CV LAB;  Service: Cardiovascular;;   PERIPHERAL VASCULAR INTERVENTION  12/29/2022   Procedure: PERIPHERAL VASCULAR INTERVENTION;  Surgeon: Maeola Harman, MD;  Location: [facility] INVASIVE CV LAB;  Service: Cardiovascular;;  Left SFA, Bilateral illiac   TEE WITHOUT CARDIOVERSION N/A 11/12/2021   Procedure: TRANSESOPHAGEAL ECHOCARDIOGRAM (TEE);  Surgeon: Lovett Sox, MD;  Location: [facility] OR;  Service: Open Heart Surgery;  Laterality: N/A;   TEE WITHOUT CARDIOVERSION N/A 11/21/2021   Procedure: TRANSESOPHAGEAL ECHOCARDIOGRAM (TEE);  Surgeon: Laurey Morale, MD;  Location: [facility] ENDOSCOPY;  Service: Cardiovascular;  Laterality: N/A;    Social History  reports that he quit smoking about 17 years ago. His smoking use included cigarettes. He has never used smokeless tobacco. He reports that he does not currently use drugs after having used the following drugs: Marijuana. Frequency: 1.00 time per week. He reports that he does not drink alcohol.  No Known Allergies  Family History  Problem Relation Age of Onset  CAD Brother    Drug abuse Brother    Lung cancer Father    CAD Mother   Reviewed on admission  Prior to Admission medications   Medication Sig Start Date End Date Taking? Authorizing Provider  albuterol (VENTOLIN HFA) 108 (90 Base) MCG/ACT inhaler Inhale 2 puffs into the lungs every 6 (six) hours as needed for wheezing or shortness of breath.   Yes [provider]  amiodarone (PACERONE) 200 MG tablet Take 1 tablet (200 mg total) by mouth 2 (two) times daily.  03/17/22  Yes Baldo Daub, MD  atorvastatin (LIPITOR) 80 MG tablet Take 1 tablet (80 mg total) by mouth daily. 11/18/22  Yes Baldo Daub, MD  B Complex-C-Folic Acid (DIALYVITE 800) 0.8 MG TABS Take 0.8 mg by mouth daily. 12/16/21  Yes [provider]  clopidogrel (PLAVIX) 75 MG tablet Take 1 tablet (75 mg total) by mouth daily. 12/29/22 12/29/23 Yes Maeola Harman, MD  Dulaglutide (TRULICITY) 3 MG/0.5ML SOPN Inject 3 mg into the skin every Wednesday.   Yes [provider]  ELIQUIS 5 MG TABS tablet TAKE 1 TABLET TWICE DAILY 01/05/23  Yes Baldo Daub, MD  fluticasone furoate-vilanterol (BREO ELLIPTA) 200-25 MCG/ACT AEPB Inhale 1 puff into the lungs daily. 01/02/18  Yes [provider]  Insulin Pen Needle 32G X 4 MM MISC Use to inject Levemir 2 (two) times daily. 12/03/21  Yes Doree Fudge M, PA-C  metoprolol succinate (TOPROL-XL) 25 MG 24 hr tablet Take 25 mg by mouth daily. 12/26/21  Yes [provider]  midodrine (PROAMATINE) 5 MG tablet Take 1 tablet (5 mg total) by mouth daily. 08/19/22  Yes Baldo Daub, MD  torsemide (DEMADEX) 20 MG tablet Take 1 tablet (20 mg total) by mouth daily. 01/05/23  Yes Baldo Daub, MD  HYDROcodone-acetaminophen (NORCO) 10-325 MG tablet Take 1 tablet by mouth 3 (three) times daily as needed for moderate pain or severe pain. 01/06/23   [provider]  insulin glargine (LANTUS) 100 UNIT/ML injection Inject 38 Units into the skin 2 (two) times daily.    [provider]  Methoxy PEG-Epoetin Beta (MIRCERA IJ) Mircera 12/12/22 12/11/23  [provider]  promethazine (PHENERGAN) 12.5 MG tablet Take 12.5 mg by mouth 3 (three) times daily as needed. 01/05/23   [provider]  traMADol (ULTRAM) 50 MG tablet Take 1 tablet (50 mg total) by mouth every 8 (eight) hours as needed. 07/10/22 07/10/23  Dara Lords, PA-C    Physical Exam: Vitals:   01/26/23 1220 01/26/23 1225 01/26/23 1418  01/26/23 1430  BP: (!) 150/44 (!) 149/41 (!) 122/58 (!) 119/51  Pulse: (!) 55 (!) 58 62 65  Resp: 17 16 20 20   Temp:   97.8 F (36.6 C) 97.8 F (36.6 C)  TempSrc:      SpO2: 99% 94% 100% 99%  Weight:      Height:        Physical Exam Constitutional:      General: He is not in acute distress.    Appearance: Normal appearance.  HENT:     Head: Normocephalic and atraumatic.     Mouth/Throat:     Mouth: Mucous membranes are moist.     Pharynx: Oropharynx is clear.  Eyes:     Extraocular Movements: Extraocular movements intact.     Pupils: Pupils are equal, round, and reactive to light.  Cardiovascular:     Rate and Rhythm: Normal rate and regular rhythm.  Pulses: Normal pulses.     Heart sounds: Normal heart sounds.  Pulmonary:     Effort: Pulmonary effort is normal. No respiratory distress.     Breath sounds: Normal breath sounds.  Abdominal:     General: Bowel sounds are normal. There is no distension.     Palpations: Abdomen is soft.     Tenderness: There is no abdominal tenderness.  Musculoskeletal:        General: No swelling.     Comments: Status post left BKA, wound VAC in place.  Skin:    General: Skin is warm and dry.  Neurological:     General: No focal deficit present.     Mental Status: Mental status is at baseline.    Labs on Admission: I have personally reviewed following labs and imaging studies  CBC: Recent Labs  Lab 01/22/23 1125 01/26/23 1041  WBC  --  18.5*  HGB 9.2* 8.1*  HCT 27.0* 26.3*  MCV  --  90.4  PLT  --  451*    Basic Metabolic Panel: Recent Labs  Lab 01/22/23 1125 01/26/23 1055  NA 136 136  K 4.7 3.4*  CL 102 100  CO2  --  23  GLUCOSE 111* 138*  BUN 81* 67*  CREATININE 5.80* 7.31*  CALCIUM  --  6.7*    GFR: Estimated Creatinine Clearance: 12.2 mL/min (A) (by C-G formula based on SCr of 7.31 mg/dL (H)).  Liver Function Tests: Recent Labs  Lab 01/26/23 1055  AST 83*  ALT 84*  ALKPHOS 74  BILITOT 0.6  PROT  5.9*  ALBUMIN 2.1*    Urine analysis:    Component Value Date/Time   COLORURINE STRAW (A) 11/08/2021 1110   APPEARANCEUR CLEAR 11/08/2021 1110   LABSPEC 1.023 11/08/2021 1110   PHURINE 5.0 11/08/2021 1110   GLUCOSEU >=500 (A) 11/08/2021 1110   HGBUR NEGATIVE 11/08/2021 1110   BILIRUBINUR NEGATIVE 11/08/2021 1110   KETONESUR 5 (A) 11/08/2021 1110   PROTEINUR NEGATIVE 11/08/2021 1110   NITRITE NEGATIVE 11/08/2021 1110   LEUKOCYTESUR TRACE (A) 11/08/2021 1110    Radiological Exams on Admission: PERIPHERAL VASCULAR CATHETERIZATION  Result Date: 01/26/2023 See surgical note for result.   EKG: Not performed  Assessment/Plan Principal Problem:   Limb ischemia Active Problems:   Diabetic neuropathy (HCC)   Mixed hyperlipidemia   Paroxysmal atrial fibrillation (HCC)   Chronic anticoagulation   Benign essential HTN   Chronic diastolic CHF (congestive heart failure) (HCC)   OSA (obstructive sleep apnea)   S/P CABG x 3   Coronary artery disease   Anemia in chronic kidney disease   Dependence on renal dialysis (HCC)   Morbid (severe) obesity due to excess calories (HCC)   Type 2 diabetes mellitus with diabetic neuropathy, unspecified (HCC)   End stage renal disease (HCC)   PAD (peripheral artery disease) (HCC)   S/P BKA (below knee amputation), left (HCC)   PAD Leukocytosis Limb ischemia Status post left BKA > Patient with known PAD and limb ischemia with prior atherectomy and stenting.-Status post great toe amputation for nonhealing toe ulcer.  This amputation site had been nonhealing as well and worsening and was deemed nonsalvageable.  Underwent further evaluation by vascular surgery and was determined that BKA was the best course of action which has been performed successfully. > Noted to have leukocytosis and preoperative labs to 18.5.  Is afebrile and if wound was source of infection we have source control and he has received perioperative antibiotics.  Though  this may  have been reactive in the setting of ischemia. > Vascular surgery continues to follow - Appreciate vascular surgery recommendations and assistance - Monitor on progressive unit - Continue with home Plavix - Continue to hold home Eliquis - As needed pain control with Tylenol for mild pain, hydrocodone for moderate pain or severe pain, Dilaudid for breakthrough severe pain - Trend fever curve and WBC - Hold off on further antibiotics for now given we have achieved most likely source control if leukocytosis was not reactive in the setting of ischemia - Supportive care  ESRD > Progressed to ESRD after significant AKI during admission for CABG per chart review.  Currently remains on dialysis Monday Wednesday Friday, last dialysis session was Saturday (he missed his usual Friday session, due to visit and preparations for today's procedure) > Current creatinine stable at 7.71, potassium 3.4, calcium 6.7. - Consult to nephrology for assistance with dialysis, appreciate recommendations and assistance - Trend renal function and electrolytes  Diabetes > Now ~20 units twice daily at home. - 10 units twice daily - SSI  Hypertension - Continue home metoprolol, torsemide - Also is on midodrine  Hyperlipidemia - Continue home Plavix - Holding home Eliquis  Atrial fibrillation - Continue home amiodarone, metoprolol - Holding home Eliquis  CAD > Status post CABG - Continue home metoprolol, Plavix - Holding home Eliquis  Obesity - Noted  OSA - Continue home CPAP  Anemia > Of CKD/ESRD > Hemoglobin near baseline at 8.1. - Trend CBC  Chronic diastolic CHF > Last echo was in 2023 with EF 55-60%, G2 DD.  Subsequent TEE showed mildly reduced RV function. - Continue home torsemide, metoprolol  Obstructive airway disease - Continue home Breo and As needed albuterol   DVT prophylaxis: Right lower extremity SCD, Plavix, holding Eliquis for now per vascular surgery Code Status:    Full Family Communication:  Updated at bedside  Disposition Plan:   Patient is from:  Home  Anticipated DC to:  Pending clinical course  Anticipated DC date:  2 to 5 days  Anticipated DC barriers: None  Consults called:  Vascular surgery following Admission status:  Inpatient, progressive  Severity of Illness: The appropriate patient status for this patient is INPATIENT. Inpatient status is judged to be reasonable and necessary in order to provide the required intensity of service to ensure the patient's safety. The patient's presenting symptoms, physical exam findings, and initial radiographic and laboratory data in the context of their chronic comorbidities is felt to place them at high risk for further clinical deterioration. Furthermore, it is not anticipated that the patient will be medically stable for discharge from the hospital within 2 midnights of admission.   * I certify that at the point of admission it is my clinical judgment that the patient will require inpatient hospital care spanning beyond 2 midnights from the point of admission due to high intensity of service, high risk for further deterioration and high frequency of surveillance required.Synetta Fail MD [facility]  How to contact the [facility] Attending or Consulting provider 7A - 7P or covering provider during after hours 7P -7A, for this patient?   Check the care team in [facility] and look for a) attending/consulting [facility] provider listed and b) the [facility] team listed Log into [email or web address] and use [place]'s universal password to access. If you do not have the password, please contact the hospital operator. Locate the [facility] provider you are looking for under [facility] and page  to a number that you can be directly reached. If you still have difficulty reaching the provider, please page the [facility] (Director on Call) for the Hospitalists listed on amion for assistance.  01/26/2023, 2:59 PM

## 2023-01-26 NOTE — Anesthesia Procedure Notes (Signed)
Procedure Name: MAC Date/Time: 01/26/2023 12:47 PM  Performed by: Marena Chancy, CRNAPre-anesthesia Checklist: Patient identified, Emergency Drugs available, Suction available, Patient being monitored and Timeout performed Patient Re-evaluated:Patient Re-evaluated prior to induction Oxygen Delivery Method: Simple face mask Preoxygenation: Pre-oxygenation with 100% oxygen

## 2023-01-27 ENCOUNTER — Encounter (HOSPITAL_COMMUNITY): Payer: Self-pay | Admitting: Vascular Surgery

## 2023-01-27 DIAGNOSIS — I998 Other disorder of circulatory system: Secondary | ICD-10-CM | POA: Diagnosis not present

## 2023-01-27 LAB — GLUCOSE, CAPILLARY
Glucose-Capillary: 154 mg/dL — ABNORMAL HIGH (ref 70–99)
Glucose-Capillary: 110 mg/dL — ABNORMAL HIGH (ref 70–99)
Glucose-Capillary: 173 mg/dL — ABNORMAL HIGH (ref 70–99)
Glucose-Capillary: 159 mg/dL — ABNORMAL HIGH (ref 70–99)

## 2023-01-27 LAB — HEMOGLOBIN AND HEMATOCRIT, BLOOD
HCT: 21.8 % — ABNORMAL LOW (ref 39.0–52.0)
HCT: 24.8 % — ABNORMAL LOW (ref 39.0–52.0)
Hemoglobin: 7 g/dL — ABNORMAL LOW (ref 13.0–17.0)
Hemoglobin: 7.9 g/dL — ABNORMAL LOW (ref 13.0–17.0)

## 2023-01-27 LAB — PREPARE RBC (CROSSMATCH)

## 2023-01-27 MED ORDER — MIDODRINE HCL 5 MG PO TABS
5.0000 mg | ORAL_TABLET | ORAL | Status: DC
  Filled 2023-01-27: qty 1

## 2023-01-27 MED ORDER — SODIUM CHLORIDE 0.9% IV SOLUTION: Freq: Once | INTRAVENOUS | Status: AC

## 2023-01-27 MED ORDER — SEVELAMER CARBONATE 800 MG PO TABS
800.0000 mg | ORAL_TABLET | Freq: Three times a day (TID) | ORAL | Status: DC
  Administered 2023-01-27: 800 mg via ORAL
  Filled 2023-01-27 (×4): qty 1

## 2023-01-27 MED ORDER — AMIODARONE HCL 200 MG PO TABS
200.0000 mg | ORAL_TABLET | Freq: Two times a day (BID) | ORAL | Status: DC
  Administered 2023-01-27 – 2023-01-29 (×4): 200 mg via ORAL
  Filled 2023-01-27 (×4): qty 1

## 2023-01-27 MED ORDER — PROSOURCE PLUS PO LIQD
30.0000 mL | Freq: Three times a day (TID) | ORAL | Status: DC
  Administered 2023-01-27 (×3): 30 mL via ORAL
  Filled 2023-01-27 (×7): qty 30

## 2023-01-27 NOTE — Progress Notes (Addendum)
  Progress Note    01/27/2023 8:15 AM 1 Day Post-Op  Subjective:  painful L BKA stump   Vitals:   01/27/23 0624 01/27/23 0626  BP:  (!) 114/46  Pulse: (!) 59 (!) 58  Resp:  20  Temp:  98.3 F (36.8 C)  SpO2: 93% 98%   Physical Exam: Lungs:  non labored Incisions:  L BKA vac with good seal Neurologic: A&O  CBC    Component Value Date/Time   WBC 21.1 (H) 01/27/2023 0037   RBC 2.27 (L) 01/27/2023 0037   HGB 6.1 (LL) 01/27/2023 0037   HGB 7.6 (L) 04/17/2022 1452   HCT 19.8 (L) 01/27/2023 0037   HCT 25.3 (L) 04/17/2022 1452   PLT 429 (H) 01/27/2023 0037   PLT 351 04/17/2022 1452   MCV 87.2 01/27/2023 0037   MCV 88 04/17/2022 1452   MCH 26.9 01/27/2023 0037   MCHC 30.8 01/27/2023 0037   RDW 19.6 (H) 01/27/2023 0037   RDW 15.5 (H) 04/17/2022 1452   LYMPHSABS 1.4 02/18/2022 1117   MONOABS 2.8 (H) 11/26/2021 0436   EOSABS 0.3 02/18/2022 1117   BASOSABS 0.1 02/18/2022 1117    BMET    Component Value Date/Time   NA 132 (L) 01/27/2023 0037   NA 139 04/17/2022 1452   K 3.9 01/27/2023 0037   CL 95 (L) 01/27/2023 0037   CO2 25 01/27/2023 0037   GLUCOSE 151 (H) 01/27/2023 0037   BUN 77 (H) 01/27/2023 0037   BUN 42 (H) 04/17/2022 1452   CREATININE 8.68 (H) 01/27/2023 0037   CALCIUM 6.7 (L) 01/27/2023 0037   GFRNONAA 6 (L) 01/27/2023 0037   GFRAA 42 (L) 10/18/2019 1006    INR    Component Value Date/Time   INR 1.8 (H) 01/26/2023 1513     Intake/Output Summary (Last 24 hours) at 01/27/2023 0815 Last data filed at 01/27/2023 0618 Gross per 24 hour  Intake 1315 ml  Output 150 ml  Net 1165 ml     Assessment/Plan:  67 y.o. male is s/p L BKA 1 Day Post-Op   L BKA wound vac in place with good seal Encouraged patient to keep knee straight; use stump protector when in bed Pain medication on schedule today Agree with 1u pRBCs   Emilie Rutter, PA-C [facility] [phone] 01/27/2023 8:15 AM  VASCULAR STAFF ADDENDUM: I agree with the  above.  Keep VAC x 7 days.  Rande Brunt. Lenell Antu, MD [facility] Phone Number: [phone] 01/27/2023 4:18 PM

## 2023-01-27 NOTE — Anesthesia Postprocedure Evaluation (Signed)
Anesthesia Post Note  Patient: [name]  Procedure(s) Performed: LEFT AMPUTATION BELOW KNEE (Left: Knee) APPLICATION OF WOUND VAC, LEFT BELOW THE KNEE AMPUTATION STUMP (Left)     Patient location during evaluation: PACU Anesthesia Type: Regional and MAC Level of consciousness: awake and alert Pain management: pain level controlled Vital Signs Assessment: post-procedure vital signs reviewed and stable Respiratory status: spontaneous breathing, nonlabored ventilation and respiratory function stable Cardiovascular status: stable and blood pressure returned to baseline Postop Assessment: no apparent nausea or vomiting Anesthetic complications: no   No notable events documented.  Last Vitals:  Vitals:   01/27/23 1036 01/27/23 1050  BP: (!) 130/53 (!) 133/47  Pulse: 64 64  Resp: (!) 21 19  Temp:  36.4 C  SpO2: 100% 100%    Last Pain:  Vitals:   01/27/23 1050  TempSrc: Oral  PainSc: 10-Worst pain ever                  

## 2023-01-27 NOTE — Progress Notes (Signed)
Night cross-coverage  Patient underwent left BKA yesterday.  Hemoglobin was 8.1 on preop labs yesterday and baseline appears to be 10-11.  Hemoglobin has now dropped to 6.1 on labs this morning which is most likely due to blood loss during surgery.  Spoke to RN and no bloody drainage reported from wound VAC.  I have also spoken to the patient and discussed benefits versus risks associated with blood transfusions.  Patient is okay with receiving blood transfusions as needed and tells me he has received them in the past as well.  Type and screen was done yesterday.  I have ordered 1 unit PRBCs, follow-up posttransfusion H&H.

## 2023-01-27 NOTE — Progress Notes (Signed)
RT assisted pt. With setting up his home cpap. Home unit appears to be intact and no frayed cords. Sterile water placed and pt. States he can place his mask on when he is ready.

## 2023-01-27 NOTE — Progress Notes (Signed)
Pt receives out-pt HD at [facility] on Monday and Friday. Will assist as needed.   Olivia Canter Renal Navigator [phone]

## 2023-01-27 NOTE — Progress Notes (Signed)
POST HD TX NOTE  01/27/23 1050  Vitals  Temp 97.6 F (36.4 C)  Temp Source Oral  BP (!) 133/47  MAP (mmHg) 70  BP Location Left Arm  BP Method Automatic  Patient Position (if appropriate) Lying  Pulse Rate 64  Pulse Rate Source Monitor  ECG Heart Rate 63  Resp 19  Oxygen Therapy  SpO2 100 %  O2 Device Room Air  Pulse Oximetry Type Continuous  During Treatment Monitoring  Intra-Hemodialysis Comments (S)   (post HD tx VS check)  Post Treatment  Dialyzer Clearance Heavily streaked  Duration of HD Treatment -hour(s) 1.69 hour(s)  Hemodialysis Intake (mL) 0 mL  Liters Processed 40.39  Fluid Removed (mL) 461.79 mL  Tolerated HD Treatment Yes  Post-Hemodialysis Comments (S)  tx ended 1 hr 48 min early d/t system clotting and pt refused re start. UF goal not met, blood rinsed back, VSS. Medication Admin: Hectorol IVP, Vicodin 5/325mg  po x2 tab  AVG/AVF Arterial Site Held (minutes) 5 minutes  AVG/AVF Venous Site Held (minutes) 5 minutes  Fistula / Graft Right Upper arm Arteriovenous fistula  Placement Date/Time: 05/20/22 (c) 1320   Orientation: Right  Access Location: (c) Upper arm  Access Type: Arteriovenous fistula  Site Condition No complications  Fistula / Graft Assessment Bruit;Thrill;Present  Drainage Description None

## 2023-01-27 NOTE — Progress Notes (Signed)
[facility] PROGRESS NOTE  [name] [name] (DOB: Nov 09, 1955) ZOX:[id number] PCP: [name], PA-C   Brief Narrative: [name] [name] is a 67 y.o. male with a history of PAD, T2DM, HTN, HLD, AFib, CAD s/p CABG, OSA, ESRD on HD M&F, CHF who presented for elective left BKA on 8/5, subsequently admitted to hospitalist service. Hemodialysis 8/6, 2u RBCs 8/6  Subjective: Seen on HD, pain is controlled. He feels cold. No other issues. Denies chest pain or dyspnea  Objective: BP (!) 116/46 (BP Location: Left Arm)   Pulse 66   Temp 98.4 F (36.9 C) (Oral)   Resp 20   Ht 5\' 10"  (1.778 m)   Wt 112 kg   SpO2 97%   BMI 35.43 kg/m   Gen: No distress Pulm: Clear, nonlabored  CV: RRR, no MRG  GI: Soft, NT, ND, +BS  Neuro: Alert and oriented. No new focal deficits. Ext: Warm dry RLE, LLE w/wound vac dressing c/d/I, no blood in canister  Assessment & Plan: PVD s/p BKA:  - Holding further abx having achieved source control. No Cx's taken. - Postoperative pain management/wound vac management per VVS.   Postoperative acute blood loss anemia on anemia of CKD:  - 1u RBCs, hgb up to 7g/dl. BP still soft, pt with known severe PAD and CAD so wuill give 1u RBCs more and monitor. Wound does not have evidence of ongoing bleeding into wound vac this AM. - Holding eliquis for right now.  ESRD: s/p AKI during CABG admission, still makes urine. - Continue HD per nephrology. Getting midodrine support  CAD: s/p CABG, HLD:  - Continue metoprolol if BP will allow. Continue plavix and statin  Chronic HFpEF:  - Continue home demadex and dialysis, appears euvolemic, will need new EDW  PAF: Currently NSR/borderline sinus bradycardia.  - Continue amiodarone, metoprolol. Holding eliquis  IDT2DM:  - Update HBA1c.  - Glargine 10u BID, SSI  COPD:  - Continue BDs  Tyrone Nine, MD [facility] [email or web address] 01/27/2023, 2:03 PM

## 2023-01-27 NOTE — Progress Notes (Signed)
[facility] Progress Note   Subjective:    Seen and examined patient on HD. Tolerating UFG 1.5L. BP is 119/46. Noted Hgb dropped to 6.1 overnight likely 2nd blood loss from recent surgery. S/p 1 unit PRBCs given overnight. He reports pain at his L stump.  Objective Vitals:   01/27/23 0833 01/27/23 0900 01/27/23 0930 01/27/23 1000  BP: (!) 117/50 (!) 119/46 123/61 (!) 119/50  Pulse: (!) 34 (!) 52 65 65  Resp: 12 20 20 20   Temp:      TempSrc:      SpO2: 97% 99% 97% 95%  Weight:      Height:       Physical Exam General: Awake, alert, NAD Heart:S1 and S2; No MRGs Lungs: CTA Abdomen: Soft and non-tender Extremities: Recent L BKA; No LE edema RLE Dialysis Access: R AVF (+) B/T   Filed Weights   01/26/23 0940 01/27/23 0624 01/27/23 0818  Weight: 114.3 kg 114.7 kg 112.2 kg    Intake/Output Summary (Last 24 hours) at 01/27/2023 1019 Last data filed at 01/27/2023 0900 Gross per 24 hour  Intake 1315 ml  Output -502.05 ml  Net 1817.05 ml    Additional Objective Labs: Basic Metabolic Panel: Recent Labs  Lab 01/22/23 1125 01/26/23 1055 01/27/23 0037  NA 136 136 132*  K 4.7 3.4* 3.9  CL 102 100 95*  CO2  --  23 25  GLUCOSE 111* 138* 151*  BUN 81* 67* 77*  CREATININE 5.80* 7.31* 8.68*  CALCIUM  --  6.7* 6.7*  PHOS  --   --  6.7*   Liver Function Tests: Recent Labs  Lab 01/26/23 1055 01/27/23 0037  AST 83*  --   ALT 84*  --   ALKPHOS 74  --   BILITOT 0.6  --   PROT 5.9*  --   ALBUMIN 2.1* 2.0*   No results for input(s): "LIPASE", "AMYLASE" in the last 168 hours. CBC: Recent Labs  Lab 01/26/23 1041 01/27/23 0037 01/27/23 0837  WBC 18.5* 21.1*  --   HGB 8.1* 6.1* 7.0*  HCT 26.3* 19.8* 21.8*  MCV 90.4 87.2  --   PLT 451* 429*  --    Blood Culture    Component Value Date/Time   SDES ABSCESS 11/27/2021 1148   SPECREQUEST  11/27/2021 1148    POST CT GUIDED LEFT RETROPERITONEAL HEMATOMA ASPIRATION   CULT  11/27/2021 1148    No growth  aerobically or anaerobically. Performed at [facility], [address] [place], [place] [address]    REPTSTATUS 12/02/2021 FINAL 11/27/2021 1148    Cardiac Enzymes: No results for input(s): "CKTOTAL", "CKMB", "CKMBINDEX", "TROPONINI" in the last 168 hours. CBG: Recent Labs  Lab 01/26/23 1150 01/26/23 1426 01/26/23 1604 01/26/23 2058 01/27/23 0604  GLUCAP 131* 141* 124* 143* 154*   Iron Studies: No results for input(s): "IRON", "TIBC", "TRANSFERRIN", "FERRITIN" in the last 72 hours. Lab Results  Component Value Date   INR 1.8 (H) 01/26/2023   INR 1.3 (H) 11/28/2021   INR 1.3 (H) 11/27/2021   Studies/Results: PERIPHERAL VASCULAR CATHETERIZATION  Result Date: 01/26/2023 See surgical note for result.   Medications:   atorvastatin  80 mg Oral Daily   Chlorhexidine Gluconate Cloth  6 each Topical Q0600   clopidogrel  75 mg Oral Daily   doxercalciferol  2 mcg Intravenous Once per day on Tuesday Friday   fentaNYL (SUBLIMAZE) injection  50 mcg Intravenous Once   fluticasone furoate-vilanterol  1 puff Inhalation Daily  insulin aspart  0-6 Units Subcutaneous TID WC   insulin glargine-yfgn  10 Units Subcutaneous BID   metoprolol succinate  25 mg Oral Daily   midodrine  5 mg Oral Daily   multivitamin  1 tablet Oral Daily   sodium chloride flush  3 mL Intravenous Q12H   torsemide  20 mg Oral Daily    Dialysis Orders: Monday/Friday - [facility] 3.5hrs, BFR 400, DFR 800,  EDW 113.6kg, 3K/ 2.5Ca No Heparin Mircera 150 mcg q2wks - last 01/24/23 Hectorol with HD - last dose on 01/24/23  Assessment/Plan: LLE ischemia - with known PAD and limb ischemia with prior atherectomy and stenting. S/p L BKA today by Dr. Lenell Antu. ESRD -  on HD twice weekly Monday and Fridays. Last HD on 8/3. On HD this morning. Hypertension/volume  - Blood pressure and volume remain stable. Will need to adjust EDW at discharge given recent BKA. Noted he's on PO Midodrine and  Metoprolol in outpatient? Blood pressures remain stable. I discussed this with his primary Nephrologist Dr. Signe Colt today. Will adjust PO Midodrine to be given on HD days only for now and continue Metoprolol 25mg  daily. Continue Torsemide since he receives HD 2x weekly. Continue monitoring trend and make further adjustments if needed. Anemia of CKD - Hgb dropped to 6.1 overnight (likely d/t blood loss s/p surgery). 1 unit PRBCs given overnight. Hgb now 7. Transfuse for Hgb < 7. Secondary Hyperparathyroidism -  Corr Ca 8.3, continue Hectorol. Phos is high and doesn't appear he's on a binder at [facility], will start here. Nutrition - Continue renal diet and fluid restriction. Start protein supplements  Salome Holmes, NP [facility] 01/27/2023,10:19 AM  LOS: 1 day

## 2023-01-28 DIAGNOSIS — I998 Other disorder of circulatory system: Secondary | ICD-10-CM | POA: Diagnosis not present

## 2023-01-28 LAB — GLUCOSE, CAPILLARY
Glucose-Capillary: 140 mg/dL — ABNORMAL HIGH (ref 70–99)
Glucose-Capillary: 133 mg/dL — ABNORMAL HIGH (ref 70–99)
Glucose-Capillary: 164 mg/dL — ABNORMAL HIGH (ref 70–99)
Glucose-Capillary: 154 mg/dL — ABNORMAL HIGH (ref 70–99)

## 2023-01-28 NOTE — Evaluation (Signed)
Occupational Therapy Evaluation Patient Details Name: [name] [name] MRN: [id number] DOB: 1956-02-02 Today's Date: 01/28/2023   History of Present Illness Pt is 67 year old man admitted on 8/5 for L BKA due to non healing wound on his foot. PMH: L toe amputation, PAD, DM2, afib, CAD s/p CABG, OSA, ESRD, CHF, COPD, obesity.   Clinical Impression   Pt is typically independent and drives himself to HD Monday and Friday. Pt presents with post surgical pain, impaired sitting balance and decreased activity tolerance. He was able to get EOB with CGA and laterally scoot to chair with CGA. Pt declined attempt to stand. Educated pt in compensatory strategies for LB ADLs and use/availability of drop arm commode for showering and elevating toilet. Will follow acutely. Pt is eager to go home, recommending HHOT.       If plan is discharge home, recommend the following: A little help with walking and/or transfers;A lot of help with bathing/dressing/bathroom;Assistance with cooking/housework;Help with stairs or ramp for entrance;Assist for transportation    Functional Status Assessment  Patient has had a recent decline in their functional status and demonstrates the ability to make significant improvements in function in a reasonable and predictable amount of time.  Equipment Recommendations  Wheelchair (measurements OT);Wheelchair cushion (measurements OT);(drop arm commode (wide), transfer board)    Recommendations for Other Services       Precautions / Restrictions Precautions Precautions: Fall Required Braces or Orthoses: Other Brace (MD is ordering a KI) Other Brace: watch for KI for L LE Restrictions Weight Bearing Restrictions: Yes LLE Weight Bearing: Non weight bearing      Mobility Bed Mobility Overal bed mobility: Needs Assistance Bed Mobility: Supine to Sit     Supine to sit: Contact guard, HOB elevated     General bed mobility comments: increased time, use of momentum     Transfers Overall transfer level: Needs assistance   Transfers: Bed to chair/wheelchair/BSC            Lateral/Scoot Transfers: Contact guard assist        Balance Overall balance assessment: Needs assistance   Sitting balance-Leahy Scale: Fair                                     ADL either performed or assessed with clinical judgement   ADL Overall ADL's : Needs assistance/impaired Eating/Feeding: Independent;Sitting   Grooming: Set up;Sitting   Upper Body Bathing: Supervision/ safety;Sitting   Lower Body Bathing: Minimal assistance;Sitting/lateral leans   Upper Body Dressing : Supervision/safety;Sitting   Lower Body Dressing: Minimal assistance;Sitting/lateral leans   Toilet Transfer: Contact guard assist Toilet Transfer Details (indicate cue type and reason): lateral scoot to recliner           General ADL Comments: Educated pt in importance of keeping L knee straight, limb protector fits poorly, MD to order KI. Instructed in compensatory strategies for LB ADLs and pericare in sitting.     Vision Baseline Vision/History: 1 Wears glasses Ability to See in Adequate Light: 0 Adequate Patient Visual Report: No change from baseline       Perception         Praxis         Pertinent Vitals/Pain Pain Assessment Pain Assessment: 0-10 Pain Score: 6  Pain Location: L LE Pain Descriptors / Indicators: Grimacing, Guarding, Sore Pain Intervention(s): Repositioned, Monitored during session, RN gave pain meds during session  Extremity/Trunk Assessment Upper Extremity Assessment Upper Extremity Assessment: Defer to OT evaluation   Lower Extremity Assessment Lower Extremity Assessment: Defer to PT evaluation   Cervical / Trunk Assessment Cervical / Trunk Assessment: Normal   Communication     Cognition Arousal: Alert Behavior During Therapy: WFL for tasks assessed/performed Overall Cognitive Status: Within Functional Limits for  tasks assessed                                       General Comments       Exercises     Shoulder Instructions      Home Living Family/patient expects to be discharged to:: Private residence Living Arrangements: Spouse/significant other;Children Available Help at Discharge: Family;Available 24 hours/day Type of Home: House Home Access: Ramped entrance     Home Layout: One level     Bathroom Shower/Tub: Producer, television/film/video: Handicapped height     Home Equipment: Rollator (4 wheels)   Additional Comments: pt has old DME from his deceased mother in law, recommend new      Prior Functioning/Environment Prior Level of Function : Independent/Modified Independent;Driving             Mobility Comments: walking withou AD since his toe sx ADLs Comments: drives himself to HD, stands to shower        OT Problem List: Decreased activity tolerance;Impaired balance (sitting and/or standing);Pain      OT Treatment/Interventions: Self-care/ADL training;DME and/or AE instruction;Patient/family education;Balance training;Therapeutic activities    OT Goals(Current goals can be found in the care plan section) Acute Rehab OT Goals OT Goal Formulation: With patient Time For Goal Achievement: 02/11/23 Potential to Achieve Goals: Good ADL Goals Pt Will Perform Lower Body Bathing: with modified independence;sitting/lateral leans Pt Will Perform Lower Body Dressing: with modified independence;sitting/lateral leans Pt Will Transfer to Toilet: with modified independence;bedside commode Pt Will Perform Toileting - Clothing Manipulation and hygiene: with modified independence;sitting/lateral leans Additional ADL Goal #1: Pt will complete bed mobility mod I in preparation for ADLs.  OT Frequency: Min 1X/week    Co-evaluation              AM-PAC OT "6 Clicks" Daily Activity     Outcome Measure Help from another person eating meals?: None Help from  another person taking care of personal grooming?: A Little Help from another person toileting, which includes using toliet, bedpan, or urinal?: A Lot Help from another person bathing (including washing, rinsing, drying)?: A Little Help from another person to put on and taking off regular upper body clothing?: A Little Help from another person to put on and taking off regular lower body clothing?: A Little 6 Click Score: 18   End of Session Equipment Utilized During Treatment: Gait belt Nurse Communication: Mobility status  Activity Tolerance: Patient tolerated treatment well Patient left: in chair;with call bell/phone within reach;with chair alarm set  OT Visit Diagnosis: Pain Pain - Right/Left: Left Pain - part of body: Leg                Time: 1325-1402 OT Time Calculation (min): 37 min Charges:  OT General Charges $OT Visit: 1 Visit OT Evaluation $OT Eval Moderate Complexity: 1 Mod OT Treatments $Self Care/Home Management : 8-22 mins  Berna Spare, OTR/L Acute Rehabilitation Services Office: [phone]   Evern Bio 01/28/2023, 2:17 PM

## 2023-01-28 NOTE — Progress Notes (Signed)
Consult received for PIV placement. Arrived to patient's room. Patient decline PIV at this time. Patient states "probably going home tomorrow." Notified nurse. Instructed to reconsult as needed. Tomasita Morrow, RN VAST

## 2023-01-28 NOTE — Progress Notes (Signed)
[facility] Progress Note   Subjective:    Seen and examined patient at bedside. He denies any acute complaints. HD ended early yesterday 2nd system clotting and pt refused to restart. Heparin bolus was held post-op but will resume tight heparin with next treatment tomorrow to avoid further clotting.  Objective Vitals:   01/28/23 0311 01/28/23 0721 01/28/23 1000 01/28/23 1103  BP: (!) 114/49 (!) 122/43  (!) 121/50  Pulse: 60 (!) 55 (!) 55 (!) 37  Resp: 18 (!) 21  17  Temp: 98.1 F (36.7 C) 98.2 F (36.8 C)  98.3 F (36.8 C)  TempSrc: Oral Oral  Oral  SpO2: 90% 97%  93%  Weight:      Height:       Physical Exam General: Awake, alert, NAD Heart:S1 and S2; No MRGs Lungs: CTA Abdomen: Soft and non-tender Extremities: Recent L BKA, no edema at stump; No LE edema RLE Dialysis Access: R AVF (+) B/T  Filed Weights   01/27/23 0624 01/27/23 0818 01/27/23 1050  Weight: 114.7 kg 112.2 kg 112 kg    Intake/Output Summary (Last 24 hours) at 01/28/2023 1626 Last data filed at 01/27/2023 2000 Gross per 24 hour  Intake 574 ml  Output --  Net 574 ml    Additional Objective Labs: Basic Metabolic Panel: Recent Labs  Lab 01/26/23 1055 01/27/23 0037 01/27/23 2345  NA 136 132* 131*  K 3.4* 3.9 3.7  CL 100 95* 93*  CO2 23 25 25   GLUCOSE 138* 151* 166*  BUN 67* 77* 62*  CREATININE 7.31* 8.68* 7.47*  CALCIUM 6.7* 6.7* 6.9*  PHOS  --  6.7* 6.5*   Liver Function Tests: Recent Labs  Lab 01/26/23 1055 01/27/23 0037 01/27/23 2345  AST 83*  --   --   ALT 84*  --   --   ALKPHOS 74  --   --   BILITOT 0.6  --   --   PROT 5.9*  --   --   ALBUMIN 2.1* 2.0* 1.8*   No results for input(s): "LIPASE", "AMYLASE" in the last 168 hours. CBC: Recent Labs  Lab 01/26/23 1041 01/27/23 0037 01/27/23 0837 01/27/23 2158 01/27/23 2345  WBC 18.5* 21.1*  --   --  17.9*  HGB 8.1* 6.1* 7.0* 7.9* 7.5*  HCT 26.3* 19.8* 21.8* 24.8* 23.4*  MCV 90.4 87.2  --   --  85.7  PLT 451*  429*  --   --  347   Blood Culture    Component Value Date/Time   SDES ABSCESS 11/27/2021 1148   SPECREQUEST  11/27/2021 1148    POST CT GUIDED LEFT RETROPERITONEAL HEMATOMA ASPIRATION   CULT  11/27/2021 1148    No growth aerobically or anaerobically. Performed at [facility], [address] [place], [place] [address]    REPTSTATUS 12/02/2021 FINAL 11/27/2021 1148    Cardiac Enzymes: No results for input(s): "CKTOTAL", "CKMB", "CKMBINDEX", "TROPONINI" in the last 168 hours. CBG: Recent Labs  Lab 01/27/23 1201 01/27/23 1628 01/27/23 2103 01/28/23 0556 01/28/23 1104  GLUCAP 110* 173* 159* 140* 133*   Iron Studies: No results for input(s): "IRON", "TIBC", "TRANSFERRIN", "FERRITIN" in the last 72 hours. Lab Results  Component Value Date   INR 1.8 (H) 01/26/2023   INR 1.3 (H) 11/28/2021   INR 1.3 (H) 11/27/2021   Studies/Results: No results found.  Medications:   (feeding supplement) PROSource Plus  30 mL Oral TID BM   amiodarone  200 mg Oral BID  atorvastatin  80 mg Oral Daily   Chlorhexidine Gluconate Cloth  6 each Topical Q0600   clopidogrel  75 mg Oral Daily   doxercalciferol  2 mcg Intravenous Once per day on Tuesday Friday   fentaNYL (SUBLIMAZE) injection  50 mcg Intravenous Once   fluticasone furoate-vilanterol  1 puff Inhalation Daily   insulin aspart  0-6 Units Subcutaneous TID WC   insulin glargine-yfgn  10 Units Subcutaneous BID   metoprolol succinate  25 mg Oral Daily   midodrine  5 mg Oral Once per day on Tuesday Friday   multivitamin  1 tablet Oral Daily   sevelamer carbonate  800 mg Oral TID WC   sodium chloride flush  3 mL Intravenous Q12H   torsemide  20 mg Oral Daily    Dialysis Orders: Monday/Friday - [facility] 3.5hrs, BFR 400, DFR 800,  EDW 113.6kg, 3K/ 2.5Ca No Heparin Mircera 150 mcg q2wks - last 01/24/23 Hectorol with HD - last dose on 01/24/23  Assessment/Plan: LLE ischemia - with known PAD and limb  ischemia with prior atherectomy and stenting. S/p L BKA 8/5 by Dr. Lenell Antu. ESRD - on HD twice weekly Monday and Fridays. Last HD on 8/3. Treatment ended early 2nd system clotting and pt refused to restart, next HD 01/29/23 and will use tight Heparin to avoid further clotting. Hypertension/volume  - Blood pressure and volume remain stable. Will need to adjust EDW at discharge given recent BKA. Continue PO Midodrine on HD days and Metoprolol 25mg  daily for now. Continue Torsemide since he receives HD 2x weekly. If BP remains stable, may be able to wean off Metoprolol. Anemia of CKD - S/p 2 units PRBCs 8/6 for Hgb dropped to 6.1 (likely d/t blood loss s/p surgery). Hgb now 7.5. ESA last given on 8/3 so not due yet. Transfuse for Hgb < 7. Secondary Hyperparathyroidism -  Corr Ca 8.3, continue Hectorol. Phos is high here and binders started. Nutrition - Continue renal diet and fluid restriction. On protein supplements  Salome Holmes, NP [facility] 01/28/2023,4:26 PM  LOS: 2 days

## 2023-01-28 NOTE — Progress Notes (Signed)
PT Cancellation Note  Patient Details Name: [name] [name] MRN: [id number] DOB: Feb 07, 1956   Cancelled Treatment:    Reason Eval/Treat Not Completed: Patient declined, no reason specified. Pt declines PT evaluation at this time, not wanting to mobilize. Pt reports feeling comfortable performing lateral scoot transfers. Pt would benefit from PT eval to attempt transfers from varying heights or with use of RW. PT anticipates pt will require a manual wheelchair and RW at the time of discharge.   [name] 01/28/2023, 5:28 PM

## 2023-01-28 NOTE — Progress Notes (Signed)
[facility] PROGRESS NOTE  [name] [name] (DOB: [birth year]) NGE:[id number] PCP: [name], PA-C   Brief Narrative: [name] [name] is a 67 y.o. male with a history of PAD, T2DM, HTN, HLD, AFib, CAD s/p CABG, OSA, ESRD on HD M&F, CHF who presented for elective left BKA on 8/5, subsequently admitted to hospitalist service. Hemodialysis 8/6, 2u RBCs 8/6  Subjective: Pain in stump is only complaints. Wants to go home. No further bleeding. No chest pain or dyspnea.  Objective: BP (!) 122/43 (BP Location: Left Arm)   Pulse (!) 55   Temp 98.2 F (36.8 C) (Oral)   Resp (!) 21   Ht 5\' 10"  (1.778 m)   Wt 112 kg   SpO2 97%   BMI 35.43 kg/m   Gen: No distress, chronically ill-appearing Pulm: Clear, nonlabored  CV: RRR GI: Soft, NT, ND, +BS  Neuro: Alert and oriented. No new focal deficits. Ext: Warm, LLE BKA w/wound vac in position, small blood in canister. No proximally spreading erythema noted. Skin: No other rashes, lesions or ulcers on visualized skin   Assessment & Plan: PVD s/p BKA: Surgical pathology states proximal wound edge appeared viable. - Holding further abx having achieved source control. No Cx's taken. - Postoperative pain management/wound vac management per VVS. Continue x7 days. - PT/OT evaluation pending.  Postoperative acute blood loss anemia on anemia of CKD:  - 2u RBCs 8/6, hgb 7.5g/dl. Pt with known severe PAD and CAD so could give 1u RBCs if any symptoms develop.  - Holding eliquis for right now. Defer timing of restart to VVS.  ESRD: s/p AKI during CABG admission, still makes urine. - Continue HD per nephrology. Getting midodrine support  CAD: s/p CABG, HLD:  - Continue metoprolol if BP will allow. Continue plavix and statin  Chronic HFpEF:  - Continue home demadex and dialysis, appears euvolemic, will need new EDW  PAF: Remains in NSR/SB - Continue amiodarone, metoprolol. Holding eliquis  IDT2DM: Last HbA1c 9.3% - Update HBA1c. (reorder) -  Glargine 10u BID, SSI  COPD:  - Continue BDs  Tyrone Nine, MD [facility] [email or web address] 01/28/2023, 9:10 AM

## 2023-01-28 NOTE — Progress Notes (Addendum)
  Progress Note    01/28/2023 7:41 AM 2 Days Post-Op  Subjective:  no complaints   Vitals:   01/28/23 0311 01/28/23 0721  BP: (!) 114/49 (!) 122/43  Pulse: 60 (!) 55  Resp: 18 (!) 21  Temp: 98.1 F (36.7 C) 98.2 F (36.8 C)  SpO2: 90% 97%   Physical Exam: Lungs:  non labored Incisions:  L BKA vac with good seal Abdomen:  soft Neurologic: A&O  CBC    Component Value Date/Time   WBC 17.9 (H) 01/27/2023 2345   RBC 2.73 (L) 01/27/2023 2345   HGB 7.5 (L) 01/27/2023 2345   HGB 7.6 (L) 04/17/2022 1452   HCT 23.4 (L) 01/27/2023 2345   HCT 25.3 (L) 04/17/2022 1452   PLT 347 01/27/2023 2345   PLT 351 04/17/2022 1452   MCV 85.7 01/27/2023 2345   MCV 88 04/17/2022 1452   MCH 27.5 01/27/2023 2345   MCHC 32.1 01/27/2023 2345   RDW 19.9 (H) 01/27/2023 2345   RDW 15.5 (H) 04/17/2022 1452   LYMPHSABS 1.4 02/18/2022 1117   MONOABS 2.8 (H) 11/26/2021 0436   EOSABS 0.3 02/18/2022 1117   BASOSABS 0.1 02/18/2022 1117    BMET    Component Value Date/Time   NA 131 (L) 01/27/2023 2345   NA 139 04/17/2022 1452   K 3.7 01/27/2023 2345   CL 93 (L) 01/27/2023 2345   CO2 25 01/27/2023 2345   GLUCOSE 166 (H) 01/27/2023 2345   BUN 62 (H) 01/27/2023 2345   BUN 42 (H) 04/17/2022 1452   CREATININE 7.47 (H) 01/27/2023 2345   CALCIUM 6.9 (L) 01/27/2023 2345   GFRNONAA 7 (L) 01/27/2023 2345   GFRAA 42 (L) 10/18/2019 1006    INR    Component Value Date/Time   INR 1.8 (H) 01/26/2023 1513     Intake/Output Summary (Last 24 hours) at 01/28/2023 0741 Last data filed at 01/27/2023 2000 Gross per 24 hour  Intake 574 ml  Output 1418.8 ml  Net -844.8 ml     Assessment/Plan:  67 y.o. male is s/p L BKA 2 Days Post-Op   Continue vac for 7 days post op Continue current pain medication regimen Post transfusion Hgb 7.5; continue to monitor PT/OT eval pending   Emilie Rutter, PA-C [facility] [phone] 01/28/2023 7:41 AM  VASCULAR STAFF ADDENDUM: I have  independently interviewed and examined the patient. I agree with the above.   Rande Brunt. Lenell Antu, MD [facility] Phone Number: [phone] 01/28/2023 1:55 PM

## 2023-01-29 DIAGNOSIS — I998 Other disorder of circulatory system: Secondary | ICD-10-CM | POA: Diagnosis not present

## 2023-01-29 LAB — GLUCOSE, CAPILLARY
Glucose-Capillary: 138 mg/dL — ABNORMAL HIGH (ref 70–99)
Glucose-Capillary: 125 mg/dL — ABNORMAL HIGH (ref 70–99)

## 2023-01-29 MED ORDER — HYDROCODONE-ACETAMINOPHEN 5-325 MG PO TABS: 1.0000 | ORAL_TABLET | Freq: Four times a day (QID) | ORAL | 0 refills | Status: AC | PRN

## 2023-01-29 NOTE — Plan of Care (Signed)
[facility] Patient Discharge Orders- [facility] CLINIC: [facility]  Patient's name: [name] [name] Admit/DC Dates: 01/26/2023 - 01/29/2023  Discharge Diagnoses: LLE ischemia, s/p L BKA 8/5 by Dr. Lenell Antu     Aranesp: Given: No, last ESA given on 01/24/23    Last Hgb: 7.5 PRBC's Given: Yes Date/# of units: 2 units PRBCs given on 8/6 ESA dose for discharge: Raise mircera 200 mcg IV q 2 weeks    Heparin change: No  EDW Change: No  Bath Change: No  Access intervention/Change: No   Hectorol change: No  Discharge Labs: Calcium 6.6 Phosphorus 7.9 Albumin 1.8 K+ 3.3  IV Antibiotics: No   On Coumadin?: No    OTHER/APPTS/LAB ORDERS:  Nursing: Re-check CBC with iron studies tomorrow pre-HD. Give Venofer load per protocol. Also re-check K+ tomorrow as well.  This is for the renal provider: Noted he was on both Midodrine and Metoprolol simultaneously. Unclear why. I made his Midodrine to HD days only. Monitor his BP trend. We may be able to wean one of this medications off if Bps remains stable!    D/C Meds to be reconciled by nurse after every discharge.  Completed By: Salome Holmes, NP   Reviewed by: MD:______ RN_______

## 2023-01-29 NOTE — Progress Notes (Addendum)
[facility] Progress Note   Subjective:    Noted patient is okay for discharge today from a VVS standpoint. HD was originally scheduled for hemodialysis today then Saturday outpatient BUT patient refused this and is adamant on going home today. Seen and examined patient at bedside. He denies SOB, CP, and N/V and feels fine. Labs overall are not too bad. Patient is okay for discharge from a renal standpoint. I called his outpatient HD center today and they do have his chair available for tomorrow (01/30/23) which is actually his routine schedule. He will resume his HD tomorrow.  Objective Vitals:   01/29/23 0609 01/29/23 0720 01/29/23 0850 01/29/23 0902  BP:  (!) 121/56  130/80  Pulse: (!) 55 (!) 55  60  Resp:  19  20  Temp:  97.6 F (36.4 C)  97.6 F (36.4 C)  TempSrc:  Axillary  Oral  SpO2: 98% 98% 100% 100%  Weight: 117.5 kg     Height:       Physical Exam General: Awake, alert, NAD, on RA Heart:S1 and S2; No MRGs Lungs: CTA Abdomen: Soft and non-tender Extremities: Recent L BKA, no edema at stump; No LE edema RLE Dialysis Access: R AVF (+) B/T  Filed Weights   01/27/23 0818 01/27/23 1050 01/29/23 0609  Weight: 112.2 kg 112 kg 117.5 kg   No intake or output data in the 24 hours ending 01/29/23 1136  Additional Objective Labs: Basic Metabolic Panel: Recent Labs  Lab 01/27/23 0037 01/27/23 2345 01/28/23 2356  NA 132* 131* 130*  K 3.9 3.7 3.3*  CL 95* 93* 94*  CO2 25 25 20*  GLUCOSE 151* 166* 145*  BUN 77* 62* 80*  CREATININE 8.68* 7.47* 8.95*  CALCIUM 6.7* 6.9* 6.6*  PHOS 6.7* 6.5* 7.9*   Liver Function Tests: Recent Labs  Lab 01/26/23 1055 01/27/23 0037 01/27/23 2345 01/28/23 2356  AST 83*  --   --   --   ALT 84*  --   --   --   ALKPHOS 74  --   --   --   BILITOT 0.6  --   --   --   PROT 5.9*  --   --   --   ALBUMIN 2.1* 2.0* 1.8* 1.8*   No results for input(s): "LIPASE", "AMYLASE" in the last 168 hours. CBC: Recent Labs  Lab  01/26/23 1041 01/27/23 0037 01/27/23 0837 01/27/23 2158 01/27/23 2345 01/28/23 2356  WBC 18.5* 21.1*  --   --  17.9* 17.4*  HGB 8.1* 6.1*   < > 7.9* 7.5* 7.5*  HCT 26.3* 19.8*   < > 24.8* 23.4* 23.9*  MCV 90.4 87.2  --   --  85.7 84.2  PLT 451* 429*  --   --  347 374   < > = values in this interval not displayed.   Blood Culture    Component Value Date/Time   SDES ABSCESS 11/27/2021 1148   SPECREQUEST  11/27/2021 1148    POST CT GUIDED LEFT RETROPERITONEAL HEMATOMA ASPIRATION   CULT  11/27/2021 1148    No growth aerobically or anaerobically. Performed at [facility], [address] [place], [place] [address]    REPTSTATUS 12/02/2021 FINAL 11/27/2021 1148    Cardiac Enzymes: No results for input(s): "CKTOTAL", "CKMB", "CKMBINDEX", "TROPONINI" in the last 168 hours. CBG: Recent Labs  Lab 01/28/23 1104 01/28/23 1639 01/28/23 2121 01/29/23 0608 01/29/23 1055  GLUCAP 133* 164* 154* 138* 125*   Iron  Studies: No results for input(s): "IRON", "TIBC", "TRANSFERRIN", "FERRITIN" in the last 72 hours. Lab Results  Component Value Date   INR 1.8 (H) 01/26/2023   INR 1.3 (H) 11/28/2021   INR 1.3 (H) 11/27/2021   Studies/Results: No results found.  Medications:   (feeding supplement) PROSource Plus  30 mL Oral TID BM   amiodarone  200 mg Oral BID   atorvastatin  80 mg Oral Daily   Chlorhexidine Gluconate Cloth  6 each Topical Q0600   clopidogrel  75 mg Oral Daily   doxercalciferol  2 mcg Intravenous Once per day on Tuesday Friday   fentaNYL (SUBLIMAZE) injection  50 mcg Intravenous Once   fluticasone furoate-vilanterol  1 puff Inhalation Daily   insulin aspart  0-6 Units Subcutaneous TID WC   insulin glargine-yfgn  10 Units Subcutaneous BID   metoprolol succinate  25 mg Oral Daily   midodrine  5 mg Oral Once per day on Tuesday Friday   multivitamin  1 tablet Oral Daily   sevelamer carbonate  800 mg Oral TID WC   sodium chloride flush  3 mL Intravenous Q12H    torsemide  20 mg Oral Daily    Dialysis Orders: Monday/Friday - [facility] 3.5hrs, BFR 400, DFR 800,  EDW 113.6kg, 3K/ 2.5Ca No Heparin Mircera 150 mcg q2wks - last 01/24/23 Hectorol with HD - last dose on 01/24/23  Assessment/Plan: LLE ischemia - with known PAD and limb ischemia with prior atherectomy and stenting. S/p L BKA 8/5 by Dr. Lenell Antu. ESRD - on HD twice weekly Monday and Fridays. Last HD on 8/3. Treatment ended early on 8/6 2nd system clotting and pt refused to restart. Wanted him to have HD here today then Saturday outpatient BUT patient refused and very adamant on going home. He will resume HD tomorrow at [facility] per his usual schedule for 3.5hrs. I spoke to the charge nurse at [facility] today who confirmed his chair is still available. Hypertension/volume  - Blood pressure and volume remain stable. Will need to adjust EDW at discharge given recent BKA. Continue PO Midodrine on HD days and Metoprolol 25mg  daily for now. Continue Torsemide since he receives HD 2x weekly. If BP remains stable, may be able to wean off Metoprolol. Anemia of CKD - S/p 2 units PRBCs 8/6 for Hgb dropped to 6.1 (likely d/t blood loss s/p surgery). Hgb now 7.5. ESA last given on 8/3 so not due yet. Transfuse for Hgb < 7. Secondary Hyperparathyroidism -  Corr Ca 8.3, continue Hectorol. Phos is high here and binders started. Nutrition - Continue renal diet and fluid restriction. On protein supplements Dispo -Okay for discharge from renal standpoint  Salome Holmes, NP [facility] 01/29/2023,11:36 AM  LOS: 3 days

## 2023-01-29 NOTE — Evaluation (Signed)
Physical Therapy Evaluation Patient Details Name: [name] [name] MRN: [id number] DOB: [birth year] Today's Date: 01/29/2023  History of Present Illness  Pt is a 67 y/o male presenting for elective L BKA 8/5 with complication of ABLA  PMHx:AKI with ESRD and HD, CHF, CAD s/p CABG x3,  DM, diabetic neuropathy, OSA,  Clinical Impression  Pt/family have received a minimal amount of family education to allow them to take pt home safely until HHPT arrives.  Pt is not at baseline functioning, but will have the assist of wife and 2 sons. There are no further acute PT needs.  Will sign off at this time.         If plan is discharge home, recommend the following: A lot of help with walking and/or transfers;A lot of help with bathing/dressing/bathroom;Assistance with cooking/housework;Direct supervision/assist for medications management;Assist for transportation;Help with stairs or ramp for entrance   Can travel by private vehicle        Equipment Recommendations Other (comment);Wheelchair (measurements PT);Wheelchair cushion (measurements PT);BSC/3in1 (sliding board, bari w/c/cusion and 3 in 1 with drop arm)  Recommendations for Other Services       Functional Status Assessment Patient has had a recent decline in their functional status and demonstrates the ability to make significant improvements in function in a reasonable and predictable amount of time.     Precautions / Restrictions Precautions Precautions: Fall Required Braces or Orthoses: Knee Immobilizer - Left Other Brace: watch for KI for L LE Restrictions LLE Weight Bearing: Non weight bearing      Mobility  Bed Mobility Overal bed mobility: Needs Assistance Bed Mobility: Supine to Sit     Supine to sit: Min assist     General bed mobility comments: needed assist to bring trunk up without rails    Transfers Overall transfer level: Needs assistance   Transfers: Bed to chair/wheelchair/BSC, Sit to/from Stand Sit to Stand:  Mod assist (x3 to a squat for pulling up underwear and pants)     Squat pivot transfers: Max assist, Mod assist (x2)     General transfer comment: unable to utilize sliding board due to no drop arm equipment available.  Educated pt/family on safe use of the sliding board until HHPT can arrive.    Ambulation/Gait                  Stairs            Wheelchair Mobility     Tilt Bed    Modified Rankin (Stroke Patients Only)       Balance Overall balance assessment: Needs assistance Sitting-balance support: No upper extremity supported, Feet supported Sitting balance-Leahy Scale: Fair     Standing balance support: Bilateral upper extremity supported, During functional activity Standing balance-Leahy Scale: Poor                               Pertinent Vitals/Pain Pain Assessment Pain Assessment: Faces Faces Pain Scale: Hurts even more Pain Location: L LE Pain Descriptors / Indicators: Grimacing, Guarding, Sore Pain Intervention(s): Monitored during session, Limited activity within patient's tolerance    Home Living Family/patient expects to be discharged to:: Private residence Living Arrangements: Spouse/significant other;Children Available Help at Discharge: Family;Available 24 hours/day Type of Home: House Home Access: Ramped entrance       Home Layout: One level Home Equipment: Rollator (4 wheels) Additional Comments: received bari drop arm commode, w/c/cushion and sliding board.    Prior  Function Prior Level of Function : Independent/Modified Independent;Driving             Mobility Comments: walking withou AD since his toe sx ADLs Comments: drives himself to HD, stands to shower     Extremity/Trunk Assessment   Upper Extremity Assessment Upper Extremity Assessment: Generalized weakness    Lower Extremity Assessment Lower Extremity Assessment: RLE deficits/detail RLE Deficits / Details: generally weak for 1 extremity  transfers       Communication   Communication Communication: No apparent difficulties  Cognition Arousal: Alert Behavior During Therapy: WFL for tasks assessed/performed Overall Cognitive Status:  (impaired from baseline, but NT formally) Area of Impairment: Safety/judgement                                        General Comments General comments (skin integrity, edema, etc.): VSS on RA    Exercises     Assessment/Plan    PT Assessment All further PT needs can be met in the next venue of care  PT Problem List Decreased strength;Decreased range of motion;Decreased activity tolerance;Decreased balance;Decreased mobility;Decreased coordination;Obesity;Pain;Decreased knowledge of use of DME       PT Treatment Interventions      PT Goals (Current goals can be found in the Care Plan section)  Acute Rehab PT Goals Patient Stated Goal: home today PT Goal Formulation: All assessment and education complete, DC therapy    Frequency       Co-evaluation               AM-PAC PT "6 Clicks" Mobility  Outcome Measure Help needed turning from your back to your side while in a flat bed without using bedrails?: A Lot Help needed moving from lying on your back to sitting on the side of a flat bed without using bedrails?: A Lot Help needed moving to and from a bed to a chair (including a wheelchair)?: Total   Help needed to walk in hospital room?: Total Help needed climbing 3-5 steps with a railing? : Total 6 Click Score: 7    End of Session Equipment Utilized During Treatment: Left knee immobilizer Activity Tolerance: Patient limited by pain;Patient tolerated treatment well Patient left: Other (comment) (W/C to prepare for d/c) Nurse Communication: Mobility status PT Visit Diagnosis: Other abnormalities of gait and mobility (R26.89);Muscle weakness (generalized) (M62.81);Pain Pain - Right/Left: Left Pain - part of body: Leg    Time: 1415-1455 PT Time  Calculation (min) (ACUTE ONLY): 40 min   Charges:   PT Evaluation $PT Eval Moderate Complexity: 1 Mod PT Treatments $Therapeutic Activity: 8-22 mins $Self Care/Home Management: 8-22 PT General Charges $$ ACUTE PT VISIT: 1 Visit         01/29/2023  Jacinto Halim., PT Acute Rehabilitation Services [phone]  (office)  Eliseo Gum  01/29/2023, 3:08 PM

## 2023-01-29 NOTE — Care Management Important Message (Signed)
Important Message  Patient Details  Name: [name] [name] MRN: [id number] Date of Birth: August 21, 1955   Medicare Important Message Given:  Yes     Renie Ora 01/29/2023, 8:24 AM

## 2023-01-29 NOTE — Progress Notes (Signed)
Orthopedic Tech Progress Note Patient Details:  [name] [name] 1955-11-10 [id number]  Ortho Devices Type of Ortho Device: Knee Immobilizer Ortho Device/Splint Location: LLE Ortho Device/Splint Interventions: Ordered, Application, Adjustment   Post Interventions Patient Tolerated: Well Instructions Provided: Adjustment of device   A  01/29/2023, 9:48 AM

## 2023-01-29 NOTE — Progress Notes (Addendum)
  Progress Note    01/29/2023 7:47 AM 3 Days Post-Op  Subjective:  wants to go home   Vitals:   01/29/23 0609 01/29/23 0720  BP:  (!) 121/56  Pulse: (!) 55 (!) 55  Resp:  19  Temp:  97.6 F (36.4 C)  SpO2: 98% 98%   Physical Exam: Lungs:  non labored Incisions:  wound vac with good seal L BKA Neurologic: A&O  CBC    Component Value Date/Time   WBC 17.4 (H) 01/28/2023 2356   RBC 2.84 (L) 01/28/2023 2356   HGB 7.5 (L) 01/28/2023 2356   HGB 7.6 (L) 04/17/2022 1452   HCT 23.9 (L) 01/28/2023 2356   HCT 25.3 (L) 04/17/2022 1452   PLT 374 01/28/2023 2356   PLT 351 04/17/2022 1452   MCV 84.2 01/28/2023 2356   MCV 88 04/17/2022 1452   MCH 26.4 01/28/2023 2356   MCHC 31.4 01/28/2023 2356   RDW 20.0 (H) 01/28/2023 2356   RDW 15.5 (H) 04/17/2022 1452   LYMPHSABS 1.4 02/18/2022 1117   MONOABS 2.8 (H) 11/26/2021 0436   EOSABS 0.3 02/18/2022 1117   BASOSABS 0.1 02/18/2022 1117    BMET    Component Value Date/Time   NA 130 (L) 01/28/2023 2356   NA 139 04/17/2022 1452   K 3.3 (L) 01/28/2023 2356   CL 94 (L) 01/28/2023 2356   CO2 20 (L) 01/28/2023 2356   GLUCOSE 145 (H) 01/28/2023 2356   BUN 80 (H) 01/28/2023 2356   BUN 42 (H) 04/17/2022 1452   CREATININE 8.95 (H) 01/28/2023 2356   CALCIUM 6.6 (L) 01/28/2023 2356   GFRNONAA 6 (L) 01/28/2023 2356   GFRAA 42 (L) 10/18/2019 1006    INR    Component Value Date/Time   INR 1.8 (H) 01/26/2023 1513    No intake or output data in the 24 hours ending 01/29/23 0747   Assessment/Plan:  67 y.o. male is s/p  L BKA 3 Days Post-Op   Patient would prefer HH over placement Switch hospital vac to Prevena pump when discharged home Office will arrange appt next week for vac removal; ok for d/c with [facility] from vascular standpoint   Emilie Rutter, PA-C [facility] [phone] 01/29/2023 7:47 AM  VASCULAR STAFF ADDENDUM: I have independently interviewed and examined the patient. I agree with the above.    Rande Brunt. Lenell Antu, MD [facility] Phone Number: [phone] 01/29/2023 8:20 AM

## 2023-01-29 NOTE — Progress Notes (Signed)
Report given to dialysis. Patient to receive dialysis today per nephrology Kathrene Bongo MD (as well as Saturday outpatient) before discharge home.  Kenard Gower, RN

## 2023-01-29 NOTE — Progress Notes (Signed)
Occupational Therapy Treatment Patient Details Name: [name] [name] MRN: [id number] DOB: 1956-06-22 Today's Date: 01/29/2023   History of present illness Pt is 67 year old man admitted on 8/5 for L BKA due to non healing wound on his foot. PMH: L toe amputation, PAD, DM2, afib, CAD s/p CABG, OSA, ESRD, CHF, COPD, obesity.   OT comments  Pt pulling up on therapist's hand to sit up at EOB, continues to have posterior bias in sitting, but no LOB. Instructed in posterior pericare in sitting, pt with difficulty reaching. Instructed in compensatory techniques, returned to sidelying and OT assisted with clean up and change of bed pad. Pt declined OOB to chair, demonstrated lateral scoot along EOB with CGA, simulating lateral transfer. Pt with decreased safety and awareness of deficits. Ordered KI as per Dr Verita Lamb verbal order as limb protector is a poor fit for pt. Continue to recommend HHOT.       If plan is discharge home, recommend the following:  A little help with walking and/or transfers;A lot of help with bathing/dressing/bathroom;Assistance with cooking/housework;Help with stairs or ramp for entrance;Assist for transportation   Equipment Recommendations  Wheelchair (measurements OT);Wheelchair cushion (measurements OT) (drop arm commode, transfer board)    Recommendations for Other Services      Precautions / Restrictions Precautions Precautions: Fall Required Braces or Orthoses: Other Brace (ordered L KI with Dr Verita Lamb verbal order) Restrictions Weight Bearing Restrictions: Yes LLE Weight Bearing: Non weight bearing       Mobility Bed Mobility Overal bed mobility: Needs Assistance Bed Mobility: Supine to Sit, Sit to Supine, Rolling Rolling: Modified independent (Device/Increase time)   Supine to sit: Min assist, HOB elevated Sit to supine: Supervision   General bed mobility comments: assisted to raise trunk    Transfers Overall transfer level: Needs assistance                 Lateral/Scoot Transfers: Contact guard assist General transfer comment: declined OOB to chair, but demonstrated lateral scoot along EOB with CGA for safety     Balance Overall balance assessment: Needs assistance   Sitting balance-Leahy Scale: Fair Sitting balance - Comments: posterior bias, cues to keep R foot on floor                                   ADL either performed or assessed with clinical judgement   ADL Overall ADL's : Needs assistance/impaired     Grooming: Wash/dry hands;Wash/dry face;Sitting;Supervision/safety                       Toileting- Architect and Hygiene: Total assistance;Sitting/lateral lean;Bed level Toileting - Clothing Manipulation Details (indicate cue type and reason): pt with difficulty reaching periarea leaning side to side due to increased body habitus, assisted to clean rolling side to side, recommended wet wipes vs washcloths at home and tongs     Functional mobility during ADLs: Contact guard assist      Extremity/Trunk Assessment              Vision       Perception     Praxis      Cognition Arousal: Alert Behavior During Therapy: WFL for tasks assessed/performed Overall Cognitive Status: Impaired/Different from baseline Area of Impairment: Safety/judgement                         Safety/Judgement:  Decreased awareness of safety, Decreased awareness of deficits              Exercises      Shoulder Instructions       General Comments      Pertinent Vitals/ Pain       Pain Assessment Pain Assessment: Faces Faces Pain Scale: Hurts little more Pain Location: L LE Pain Descriptors / Indicators: Grimacing, Guarding, Sore Pain Intervention(s): Monitored during session, Repositioned  Home Living                                          Prior Functioning/Environment              Frequency  Min 1X/week        Progress Toward  Goals  OT Goals(current goals can now be found in the care plan section)  Progress towards OT goals: Progressing toward goals  Acute Rehab OT Goals OT Goal Formulation: With patient Time For Goal Achievement: 02/11/23 Potential to Achieve Goals: Good  Plan      Co-evaluation                 AM-PAC OT "6 Clicks" Daily Activity     Outcome Measure   Help from another person eating meals?: None Help from another person taking care of personal grooming?: A Little Help from another person toileting, which includes using toliet, bedpan, or urinal?: A Lot Help from another person bathing (including washing, rinsing, drying)?: A Little Help from another person to put on and taking off regular upper body clothing?: A Little Help from another person to put on and taking off regular lower body clothing?: A Little 6 Click Score: 18    End of Session    OT Visit Diagnosis: Pain Pain - Right/Left: Left Pain - part of body: Leg   Activity Tolerance Patient tolerated treatment well   Patient Left in bed;with call bell/phone within reach;with bed alarm set   Nurse Communication Other (comment);Mobility status (KI order)        Time: 3244-0102 OT Time Calculation (min): 22 min  Charges: OT General Charges $OT Visit: 1 Visit OT Treatments $Self Care/Home Management : 8-22 mins  Berna Spare, OTR/L Acute Rehabilitation Services Office: [phone]   [name] [name] 01/29/2023, 9:38 AM

## 2023-01-29 NOTE — Discharge Summary (Signed)
Physician Discharge Summary   Patient: [name] [name] MRN: [id number] DOB: 17-Jan-1956  Admit date:     01/26/2023  Discharge date: 01/29/23  Discharge Physician: Tyrone Nine   PCP: [name], PA-C   Recommendations at discharge:  Follow up with dialysis center in 24 hours (pt declines to have dialysis prior to discharge here) Follow up CBC and BMP in the next week. Hgb 7.5g/dl and stable at discharge.  Follow up with vascular surgery next week for wound vac removal. Discharged with home health and [facility].   Discharge Diagnoses: Principal Problem:   Limb ischemia Active Problems:   Diabetic neuropathy (HCC)   Mixed hyperlipidemia   Paroxysmal atrial fibrillation (HCC)   Chronic anticoagulation   Benign essential HTN   Chronic diastolic CHF (congestive heart failure) (HCC)   OSA (obstructive sleep apnea)   S/P CABG x 3   Coronary artery disease   Anemia in chronic kidney disease   Dependence on renal dialysis (HCC)   Morbid (severe) obesity due to excess calories (HCC)   Type 2 diabetes mellitus with diabetic neuropathy, unspecified (HCC)   End stage renal disease (HCC)   PAD (peripheral artery disease) (HCC)   S/P BKA (below knee amputation), left (HCC)   Obstructive airway disease [facility])  Hospital Course: [name] [name] is a 67 y.o. male with a history of PAD, T2DM, HTN, HLD, AFib, CAD s/p CABG, OSA, ESRD on HD M&F, CHF who presented for elective left BKA on 8/5, subsequently admitted to hospitalist service. Hemodialysis 8/6, 2u RBCs 8/6. Hgb up to 7.5g/dl and hemodynamically stable on 8/8, requesting discharge. Cleared for discharge per nephrology, vascular surgery, and hospitalist services.  Assessment and Plan: PVD s/p BKA: Surgical pathology states proximal wound edge appeared viable. - Holding further abx having achieved source control. No Cx's taken. - Postoperative pain management/wound vac management per VVS. Continue x7 days and follow up in clinic. Home health and  [facility].  - Pt declined PT evaluation.   Postoperative acute blood loss anemia on anemia of CKD:  - 2u RBCs 8/6, hgb 7.5g/dl. Stable x24 hours, asymptomatic. - Ok to restart eliquis now per VVS.   ESRD: s/p AKI during CABG admission, still makes urine. - Nephrology recommended dialysis 8/8, but pt declines, will follow up per his usual routine tomorrow. We confirmed they will be able to accommodate. Continue home medications.    CAD: s/p CABG, HLD:  - Continue metoprolol. Continue plavix and statin   Chronic HFpEF:  - Continue home demadex and dialysis, appears euvolemic. Last recorded weight was 8/8 117.5kg.    PAF: Remains in NSR/SB - Continue amiodarone, metoprolol. Holding eliquis   IDT2DM: Last HbA1c 9.3%  - Anticipate increased po intake at home, so continue home Tx   COPD:  - Continue BDs  Consultants: Vascular surgery, nephrology Procedures performed:  01/26/23 LEFT AMPUTATION BELOW KNEE Leonie Dawit, MD  Disposition: Home Diet recommendation: Heart healthy, carb-modified, renal DISCHARGE MEDICATION: Allergies as of 01/29/2023   No Known Allergies      Medication List     STOP taking these medications    HYDROcodone-acetaminophen 10-325 MG tablet Commonly known as: NORCO Replaced by: HYDROcodone-acetaminophen 5-325 MG tablet   traMADol 50 MG tablet Commonly known as: Ultram       TAKE these medications    albuterol 108 (90 Base) MCG/ACT inhaler Commonly known as: VENTOLIN HFA Inhale 2 puffs into the lungs every 6 (six) hours as needed for wheezing or shortness of breath.  amiodarone 200 MG tablet Commonly known as: PACERONE Take 1 tablet (200 mg total) by mouth 2 (two) times daily.   atorvastatin 80 MG tablet Commonly known as: LIPITOR Take 1 tablet (80 mg total) by mouth daily.   BD Pen Needle Nano U/F 32G X 4 MM Misc Generic drug: Insulin Pen Needle Use to inject Levemir 2 (two) times daily.   clopidogrel 75 MG tablet Commonly known as:  Plavix Take 1 tablet (75 mg total) by mouth daily.   Dialyvite 800 0.8 MG Tabs Take 0.8 mg by mouth daily.   Eliquis 5 MG Tabs tablet Generic drug: apixaban TAKE 1 TABLET TWICE DAILY   fluticasone furoate-vilanterol 200-25 MCG/ACT Aepb Commonly known as: BREO ELLIPTA Inhale 1 puff into the lungs daily.   HYDROcodone-acetaminophen 5-325 MG tablet Commonly known as: NORCO/VICODIN Take 1-2 tablets by mouth every 6 (six) hours as needed for up to 5 days for moderate pain or severe pain. Replaces: HYDROcodone-acetaminophen 10-325 MG tablet   insulin glargine 100 UNIT/ML injection Commonly known as: LANTUS Inject 38 Units into the skin 2 (two) times daily.   metoprolol succinate 25 MG 24 hr tablet Commonly known as: TOPROL-XL Take 25 mg by mouth daily.   midodrine 5 MG tablet Commonly known as: PROAMATINE Take 1 tablet (5 mg total) by mouth daily.   MIRCERA IJ Mircera   promethazine 12.5 MG tablet Commonly known as: PHENERGAN Take 12.5 mg by mouth 3 (three) times daily as needed.   torsemide 20 MG tablet Commonly known as: DEMADEX Take 1 tablet (20 mg total) by mouth daily.   Trulicity 3 MG/0.5ML Sopn Generic drug: Dulaglutide Inject 3 mg into the skin every Wednesday.               Durable Medical Equipment  (From admission, onward)           Start     Ordered   01/29/23 1057  For home use only DME lightweight manual wheelchair with seat cushion  Once       Comments: Patient suffers from L-BKA which impairs their ability to perform daily activities like bathing, dressing, grooming, and toileting in the home.  A walker will not resolve  issue with performing activities of daily living. A wheelchair will allow patient to safely perform daily activities. Patient is not able to propel themselves in the home using a standard weight wheelchair due to endurance. Patient can self propel in the lightweight wheelchair. Length of need Lifetime. Accessories: elevating  leg rests (ELRs), wheel locks, extensions and anti-tippers. Back cushion   01/29/23 1057   01/29/23 1056  For home use only DME Other see comment  Once       Comments: Slide/transfer board- 30in - s/p BKA  Question:  Length of Need  Answer:  Lifetime   01/29/23 1055   01/29/23 1056  For home use only DME Bedside commode  Once       Comments: Wide, drop arm  Question:  Patient needs a bedside commode to treat with the following condition  Answer:  S/P BKA (below knee amputation), left (HCC)   01/29/23 1056            Follow-up Information     Center, [facility]. Go on 01/30/2023.   Why: normal scheduled time. Contact information: [address] [place] [address] [phone]         [facility] Follow up.   Specialty: Home Health Services Why: HHPT/OT/RN arranged- local  branch to service- they will contact you to schedule Contact information: [address] [place] [address] [phone]         [facility] Follow up.   Why: (Adapt)- Wheelchair, drop arm BSC, sliding board arranged- to be delivered to room prior to discharge Contact information: [address] [facility] [place] [address] [phone]         [name], PA-C Follow up.   Specialty: Physician Assistant Contact information: [address] [place] [address] [phone]         Leonie Katlin, MD Follow up.   Specialties: Vascular Surgery, Interventional Cardiology Contact information: [address] [place] [address] [phone]                Discharge Exam: Filed Weights   01/27/23 0818 01/27/23 1050 01/29/23 0609  Weight: 112.2 kg 112 kg 117.5 kg  BP 130/80 (BP Location: Left Arm)   Pulse 60   Temp 97.6 F (36.4 C) (Oral)   Resp 20   Ht 5\' 10"  (1.778 m)   Wt 117.5 kg   SpO2 100%   BMI 37.17 kg/m   Chronically ill-appearing male resting quietly in no distress, feels well, no bleeding, no chest pain,  no dyspnea, no lightheadedness. Declined PT yesterday and declines any discussion of placement.  Clear, nonlabored RRR, no MRG BKA dressing c/d/I w/wound vac in place, no spreading erythema proximally  Condition at discharge: stable  The results of significant diagnostics from this hospitalization (including imaging, microbiology, ancillary and laboratory) are listed below for reference.   Imaging Studies: PERIPHERAL VASCULAR CATHETERIZATION  Result Date: 01/26/2023 See surgical note for result.  PERIPHERAL VASCULAR CATHETERIZATION  Result Date: 01/22/2023 Images from the original result were not included.   Patient name: [name] [name]     MRN: [id number]        DOB: [birth year]        Sex: male  01/22/2023 Pre-operative Diagnosis: Critical limb ischemia of the left lower extremity with nonhealing toe amputation Post-operative diagnosis:  Same Surgeon:  [name], MD Procedure Performed: 1.  Ultrasound-guided access right common femoral artery 2.  Left lower extremity angiogram with catheter selection of left external iliac artery 3.  Ultrasound-guided access of the left dorsalis pedis artery retrograde at the ankle 4.  Unsuccessful attempt at retrograde recanalization of the anterior tibial artery for chronic total occlusion 5.  Mynx closure of the right common femoral artery 6.  38 minutes of monitored moderate conscious sedation time  Indications: Patient is a 67 year old male with end-stage renal disease that that recently underwent left SFA/popliteal atherectomy and stenting by Dr. Randie Heinz on 12/29/2022.  He has since undergone a toe amputation that has been nonhealing.  He was seen in the office on Tuesday.  This has been been progressive.  I discussed he is high risk for limb loss.  He presents after risk and benefits discussed.  Findings:  Ultrasound-guided access right common femoral artery.  We went over the aortic bifurcation.  Left lower extremity arteriogram from a external iliac artery  approach showed that his SFA stents were widely patent as well as his popliteal artery and two-vessel runoff in the peroneal and posterior tibial.  He has an occluded anterior tibial artery over long segment with severe small vessel disease in the foot.  Ultimately I accessed the left dorsalis pedis artery retrograde at the foot and got a slender sheath in place.  With my wire and catheter  I tried coming retrograde and I could not reenter into the true lumen.  Ultimately we aborted after the patient did not feel he could tolerate any further attempts.             Procedure:  The patient was identified in the holding area and taken to room 8.  The patient was then placed supine on the table and prepped and draped in the usual sterile fashion.  A time out was called.  Patient received Versed and fentanyl for conscious moderate sedation.  Vital signs were monitored including heart rate, respiratory rate, oxygenation, and blood pressure.  I was present for all of moderate sedation.  Ultrasound was used to evaluate the right common femoral artery.  It was patent .  A digital ultrasound image was acquired.  A micropuncture needle was used to access the right common femoral artery under ultrasound guidance.  An 018 wire was advanced without resistance and a micropuncture sheath was placed.  The 018 wire was removed and a benson wire was placed.  The micropuncture sheath was exchanged for a 5 french sheath.  Next, using the omniflush catheter and a benson wire, the aortic bifurcation was crossed and the catheter was placed into theleft external iliac artery and left runoff was obtained.  Ultimately elected to try and intervene on the occluded anterior tibial given he had no flow out to his toe amputation.  The foot was prepped and draped.  The dorsalis pedis was evaluated with ultrasound and this was accessed retrograde with a micropuncture needle and wire and then I placed the pedal sheath.  Patient received 100 units/kg  IV heparin.  I then used a V18 wire with a CXI catheter to come retrograde.  I used a number of wires and catheters with retrograde attempt and really could not get up to the true lumen.  We ultimately elected to abort.  Wires and catheters were removed.  The pedal sheath was removed.  Mynx closure was deployed in the groin.   Plan: Discussed that I think he needs a left below-knee amputation.  I offered to get this scheduled today.  He wants to talk with Dr. Marylene Land first.  Will arrange follow-up in 1 to 2 weeks in the office.  [name], MD [facility] Office: [phone]   VAS [place] ABI WITH/WO TBI  Result Date: 01/20/2023  LOWER EXTREMITY DOPPLER STUDY Patient Name:  [name] [name]  Date of Exam:   01/20/2023 Medical Rec #: [id number]     Accession #:    [id number] Date of Birth: 1955/12/06    Patient Gender: M Patient Age:   53 years Exam Location:  [facility] Procedure:      VAS [place] ABI WITH/WO TBI Referring Phys: Lemar Livings --------------------------------------------------------------------------------  Indications: Peripheral artery disease. High Risk         Hypertension, hyperlipidemia, Diabetes, coronary artery Factors:          disease.  Vascular Interventions: 12/29/22-                         Procedure Performed:                         1. Percutaneous access with ultrasound guidance right                         common femoral artery  2. Aortogram with bilateral lower extremity angiography                         3. Catheter selection left popliteal artery                         4. Laser athrectomy left SFA and popliteal arteries with                         2.0 mm [place]                         5. Stent left SFA and popliteal arteries with 6 x 150 mm                         Eluvia x 2 and 6 x 80 mm Eluvia proximally                         6. Moderate sedation with fentanyl and Versed for 90                          minutes                         05/20/22- Rt AVF. Performing Technologist: [place] Ponder RVS  Examination Guidelines: A complete evaluation includes at minimum, Doppler waveform signals and systolic blood pressure reading at the level of bilateral brachial, anterior tibial, and posterior tibial arteries, when vessel segments are accessible. Bilateral testing is considered an integral part of a complete examination. Photoelectric Plethysmograph (PPG) waveforms and toe systolic pressure readings are included as required and additional duplex testing as needed. Limited examinations for reoccurring indications may be performed as noted.  ABI Findings: +---------+------------------+-----+----------+--------+ Right    Rt Pressure (mmHg)IndexWaveform  Comment  +---------+------------------+-----+----------+--------+ PTA      78                0.51 monophasic         +---------+------------------+-----+----------+--------+ DP       255               1.68 monophasic         +---------+------------------+-----+----------+--------+ Great Toe44                0.29 Abnormal           +---------+------------------+-----+----------+--------+ +---------+------------------+-----+----------+-------------+ Left     Lt Pressure (mmHg)IndexWaveform  Comment       +---------+------------------+-----+----------+-------------+ Brachial 152                                            +---------+------------------+-----+----------+-------------+ PTA      136               0.89 monophasic              +---------+------------------+-----+----------+-------------+ DP       102               0.67 monophasic              +---------+------------------+-----+----------+-------------+ Great Toe  great toe amp +---------+------------------+-----+----------+-------------+ +-------+-----------+-----------+------------+------------+ ABI/TBIToday's ABIToday's  TBIPrevious ABIPrevious TBI +-------+-----------+-----------+------------+------------+ Right  [place]         0.29                                +-------+-----------+-----------+------------+------------+ Left   0.89       amputation                          +-------+-----------+-----------+------------+------------+  Summary: Right: Resting right ankle-brachial index indicates noncompressible right lower extremity arteries. The right toe-brachial index is abnormal. Left: Resting left ankle-brachial index indicates mild left lower extremity arterial disease. *See table(s) above for measurements and observations.  Electronically signed by Sherald Hess MD on 01/20/2023 at 11:54:08 AM.    Final    VAS [place] LOWER EXTREMITY ARTERIAL DUPLEX  Result Date: 01/20/2023 LOWER EXTREMITY ARTERIAL DUPLEX STUDY Patient Name:  Emmons Luca  Date of Exam:   01/20/2023 Medical Rec #: [id number]     Accession #:    [id number] Date of Birth: Aug 17, 1955    Patient Gender: M Patient Age:   30 years Exam Location:  [facility] Procedure:      VAS [place] LOWER EXTREMITY ARTERIAL DUPLEX Referring Phys: Lemar Livings --------------------------------------------------------------------------------  Indications: Peripheral artery disease. High Risk Factors: Hypertension, hyperlipidemia, coronary artery disease.  Vascular Interventions: 12/29/22-                         Procedure Performed:                         1. Percutaneous access with ultrasound guidance right                         common femoral artery                         2. Aortogram with bilateral lower extremity angiography                         3. Catheter selection left popliteal artery                         4. Laser athrectomy left SFA and popliteal arteries with                         2.0 mm [place]                         5. Stent left SFA and popliteal arteries with 6 x 150 mm                         Eluvia x 2 and 6 x 80 mm Eluvia  proximally                         6. Moderate sedation with fentanyl and Versed for 90                         minutes  05/20/22- Rt AVF. Current ABI:            Rt [place] 0.89 Performing Technologist: [place] Ponder RVS  Examination Guidelines: A complete evaluation includes B-mode imaging, spectral Doppler, color Doppler, and power Doppler as needed of all accessible portions of each vessel. Bilateral testing is considered an integral part of a complete examination. Limited examinations for reoccurring indications may be performed as noted.   +-----------+--------+-----+---------------+----------+--------+ LEFT       PSV cm/sRatioStenosis       Waveform  Comments +-----------+--------+-----+---------------+----------+--------+ CFA Prox   290          50-74% stenosistriphasic          +-----------+--------+-----+---------------+----------+--------+ DFA        194                         biphasic           +-----------+--------+-----+---------------+----------+--------+ SFA Prox   188                         monophasic         +-----------+--------+-----+---------------+----------+--------+ POP Prox   162                         monophasic         +-----------+--------+-----+---------------+----------+--------+ POP Mid    171                         monophasic         +-----------+--------+-----+---------------+----------+--------+ POP Distal 151                         monophasic         +-----------+--------+-----+---------------+----------+--------+ ATA Distal              occluded                          +-----------+--------+-----+---------------+----------+--------+ PTA Distal 111                         monophasic         +-----------+--------+-----+---------------+----------+--------+ PERO Distal34                          monophasic         +-----------+--------+-----+---------------+----------+--------+ Unable to  well visualize stent in popliteal artery.  Left Stent(s): +---------------+--------+---------------+----------+--------+ SFA            PSV cm/sStenosis       Waveform  Comments +---------------+--------+---------------+----------+--------+ Prox to Stent  139                    monophasic         +---------------+--------+---------------+----------+--------+ Proximal Stent 144                    monophasic         +---------------+--------+---------------+----------+--------+ Mid Stent      165                    monophasic         +---------------+--------+---------------+----------+--------+ Distal Stent   146                    monophasic         +---------------+--------+---------------+----------+--------+  Distal to Stent211     50-99% stenosismonophasic         +---------------+--------+---------------+----------+--------+    Summary: Left: 50-74% stenosis noted in the common femoral artery. Total occlusion noted in the anterior tibial artery. Elevated velocities distal to stent, otherwise SFA stent appears patent. Unable to well visualize stent struts in popliteal artery, however artery appears patent.  See table(s) above for measurements and observations. Electronically signed by Sherald Hess MD on 01/20/2023 at 11:53:41 AM.    Final     Microbiology: Results for orders placed or performed during the hospital encounter of 11/12/21  Culture, blood (Routine X 2) w Reflex to ID Panel     Status: None   Collection Time: 11/14/21 11:47 AM   Specimen: BLOOD  Result Value Ref Range Status   Specimen Description BLOOD RIGHT ANTECUBITAL  Final   Special Requests   Final    BOTTLES DRAWN AEROBIC AND ANAEROBIC Blood Culture adequate volume   Culture   Final    NO GROWTH 5 DAYS Performed at [facility], [address], [place], [place] [address]    Report Status 11/19/2021 FINAL  Final  Culture, blood (Routine X 2) w Reflex to ID Panel     Status: None    Collection Time: 11/14/21 11:53 AM   Specimen: BLOOD  Result Value Ref Range Status   Specimen Description BLOOD RIGHT ANTECUBITAL  Final   Special Requests   Final    BOTTLES DRAWN AEROBIC AND ANAEROBIC Blood Culture adequate volume   Culture   Final    NO GROWTH 5 DAYS Performed at [facility], [address] [place], [place] [address]    Report Status 11/19/2021 FINAL  Final  Expectorated Sputum Assessment w Gram Stain, Rflx to Resp Cult     Status: None   Collection Time: 11/20/21 11:10 AM   Specimen: Expectorated Sputum  Result Value Ref Range Status   Specimen Description EXPECTORATED SPUTUM  Final   Special Requests NONE  Final   Sputum evaluation   Final    THIS SPECIMEN IS ACCEPTABLE FOR SPUTUM CULTURE Performed at [facility], [address] [place], [place] [address]    Report Status 11/20/2021 FINAL  Final  Culture, Respiratory w Gram Stain     Status: None   Collection Time: 11/20/21 11:10 AM  Result Value Ref Range Status   Specimen Description EXPECTORATED SPUTUM  Final   Special Requests NONE Reflexed from [id number]  Final   Gram Stain   Final    ABUNDANT WBC PRESENT, PREDOMINANTLY PMN FEW YEAST WITH PSEUDOHYPHAE BUDDING YEAST SEEN RARE GRAM POSITIVE RODS Performed at [facility], [address] [place], [place] [address]    Culture FEW CANDIDA ALBICANS  Final   Report Status 11/23/2021 FINAL  Final  Body fluid culture w Gram Stain     Status: None   Collection Time: 11/22/21  1:25 PM   Specimen: Pleural Fluid  Result Value Ref Range Status   Specimen Description PLEURAL  Final   Special Requests NONE  Final   Gram Stain   Final    WBC PRESENT, PREDOMINANTLY PMN NO ORGANISMS SEEN CYTOSPIN SMEAR    Culture   Final    NO GROWTH 3 DAYS Performed at [facility], [address] [place], [place] [address]    Report Status 11/25/2021 FINAL  Final  Urine Culture     Status: None   Collection Time: 11/23/21  5:45 AM   Specimen:  Urine, Clean Catch  Result Value Ref Range Status   Specimen Description URINE, CLEAN CATCH  Final   Special Requests NONE  Final   Culture   Final    NO GROWTH Performed at [facility], [address], [place], [place] [address]    Report Status 11/24/2021 FINAL  Final  Aerobic/Anaerobic Culture w Gram Stain (surgical/deep wound)     Status: None   Collection Time: 11/27/21 11:48 AM   Specimen: Abscess  Result Value Ref Range Status   Specimen Description ABSCESS  Final   Special Requests   Final    POST CT GUIDED LEFT RETROPERITONEAL HEMATOMA ASPIRATION   Gram Stain NO WBC SEEN NO ORGANISMS SEEN   Final   Culture   Final    No growth aerobically or anaerobically. Performed at [facility], [address] [place], [place] [address]    Report Status 12/02/2021 FINAL  Final    Labs: CBC: Recent Labs  Lab 01/26/23 1041 01/27/23 0037 01/27/23 0837 01/27/23 2158 01/27/23 2345 01/28/23 2356  WBC 18.5* 21.1*  --   --  17.9* 17.4*  HGB 8.1* 6.1* 7.0* 7.9* 7.5* 7.5*  HCT 26.3* 19.8* 21.8* 24.8* 23.4* 23.9*  MCV 90.4 87.2  --   --  85.7 84.2  PLT 451* 429*  --   --  347 374   Basic Metabolic Panel: Recent Labs  Lab 01/26/23 1055 01/27/23 0037 01/27/23 2345 01/28/23 2356  NA 136 132* 131* 130*  K 3.4* 3.9 3.7 3.3*  CL 100 95* 93* 94*  CO2 23 25 25  20*  GLUCOSE 138* 151* 166* 145*  BUN 67* 77* 62* 80*  CREATININE 7.31* 8.68* 7.47* 8.95*  CALCIUM 6.7* 6.7* 6.9* 6.6*  PHOS  --  6.7* 6.5* 7.9*   Liver Function Tests: Recent Labs  Lab 01/26/23 1055 01/27/23 0037 01/27/23 2345 01/28/23 2356  AST 83*  --   --   --   ALT 84*  --   --   --   ALKPHOS 74  --   --   --   BILITOT 0.6  --   --   --   PROT 5.9*  --   --   --   ALBUMIN 2.1* 2.0* 1.8* 1.8*   CBG: Recent Labs  Lab 01/28/23 1104 01/28/23 1639 01/28/23 2121 01/29/23 0608 01/29/23 1055  GLUCAP 133* 164* 154* 138* 125*    Discharge time spent: greater than 30 minutes.  Signed: Tyrone Nine, MD [facility] 01/29/2023

## 2023-01-29 NOTE — TOC CM/SW Note (Signed)
   Durable Medical Equipment (From admission, onward)        Start     Ordered  01/29/23 1057  For home use only DME lightweight manual wheelchair with seat cushion  Once      Comments: Patient suffers from L-BKA which impairs their ability to perform daily activities like bathing, dressing, grooming, and toileting in the home.  A walker will not resolve  issue with performing activities of daily living. A wheelchair will allow patient to safely perform daily activities. Patient is not able to propel themselves in the home using a standard weight wheelchair due to endurance. Patient can self propel in the lightweight wheelchair. Length of need Lifetime. Accessories: elevating leg rests (ELRs), wheel locks, extensions and anti-tippers. Back cushion  01/29/23 1057  01/29/23 1056  For home use only DME Other see comment  Once      Comments: Slide/transfer board- 30in - s/p BKA Question:  Length of Need  Answer:  Lifetime  01/29/23 1055  01/29/23 1056  For home use only DME Bedside commode  Once      Comments: Wide, drop arm Question:  Patient needs a bedside commode to treat with the following condition  Answer:  S/P BKA (below knee amputation), left (HCC)  01/29/23 1056

## 2023-01-29 NOTE — Progress Notes (Addendum)
D/C order noted. Contacted renal NP to discuss pt's case. It was felt in pt's best interest to receive HD prior to d/c. Renal NP requested that clinic be contacted to request an appt for pt on Saturday for next out-pt HD treatment. Contacted [facility]. Clinic can treat pt on Saturday. Pt will need to arrive at 6:50 am for 7:00 am chair time. Clinic will contact pt if time needs to be adjusted for any reason. Update provided to renal NP who will discuss with pt and appt added to AVS. Clinic aware that plan is for pt to resume regular schedule next week. Clinic aware of plan for pt to d/c today.   [name] [name] Renal Navigator [phone]  Addendum at 11:40 am: Per renal NP, pt will d/c to home today and will resume at out-pt clinic tomorrow on normal schedule. Pt will not require out-pt HD treatment on Saturday. Renal NP contacted [name] and spoke to charge RN to make clinic aware that pt will resume tomorrow and will not need appt for Saturday.

## 2023-01-29 NOTE — TOC Transition Note (Signed)
Transition of Care (TOC) - CM/SW Discharge Note Donn Pierini RN, BSN Transitions of Care Unit 4E- RN Case Manager See Treatment Team for direct phone #   Patient Details  Name: [name] [name] MRN: [id number] Date of Birth: 1955-12-08  Transition of Care [facility]) CM/SW Contact:  Darrold Span, RN Phone Number: 01/29/2023, 11:34 AM   Clinical Narrative:    Pt stable for transition home today, Orders placed for HH/DME needs.   CM in to speak with pt at bedside- discussed HH/DME recommendations - pt voiced he only has rollator at home- agreeable to recommendations- discussed his insurance contracts with DME provider- [employer] - pt voiced understanding- will plan to have DME delivered to room prior to discharge.   Per Pt he lives at home w/ ex-wife and sons- voiced that his ex-wife will be at home most of the time, sons work. Ex-wife and one of his sons will transport home.  List provided for [facility] choice Per CMS guidelines from [employer] website with star ratings (copy placed in shadow chart)- pt voiced he does not have a preference- deferred to CM to find agency to provide needed services.  Address, phone # and PCP all confirmed in epic.   Pt to return home with Prevena VAC.   Call made to [facility]- referral for HHRN/PT/OT has been accepted- they will f/u to schedule start of care.   Call made to [employer] liaison for DME referral- w/c, wide drop arm BSC, and 30in slide board - once processed DME to be delivered to room.   No further TOC needs noted.    Final next level of care: Home w Home Health Services Barriers to Discharge: No Barriers Identified   Patient Goals and CMS Choice CMS Medicare.gov Compare Post Acute Care list provided to:: Patient Choice offered to / list presented to : Patient  Discharge Placement                   Home w/ [facility]      Discharge Plan and Services Additional resources added to the After Visit Summary for     Discharge Planning  Services: CM Consult Post Acute Care Choice: Durable Medical Equipment, Home Health          DME Arranged: Bedside commode, Other see comment, Wheelchair manual (slide board) DME Agency: [employer] Date DME Agency Contacted: 01/29/23 Time DME Agency Contacted: [id number] Representative spoke with at DME Agency: Zack HH Arranged: RN, PT, OT HH Agency: [facility] Date [facility] Agency Contacted: 01/29/23 Time HH Agency Contacted: 1100 Representative spoke with at [facility] Agency: Kandee Keen  Social Determinants of Health (SDOH) Interventions SDOH Screenings   Food Insecurity: No Food Insecurity (01/27/2023)  Housing: Low Risk  (01/27/2023)  Transportation Needs: No Transportation Needs (01/27/2023)  Utilities: Not At Risk (01/27/2023)  Tobacco Use: Medium Risk (01/26/2023)     Readmission Risk Interventions    01/29/2023   11:34 AM  Readmission Risk Prevention Plan  Transportation Screening Complete  PCP or Specialist Appt within 5-7 Days Complete  Home Care Screening Complete  Medication Review (RN CM) Complete

## 2023-01-30 DIAGNOSIS — Z992 Dependence on renal dialysis: Secondary | ICD-10-CM | POA: Diagnosis not present

## 2023-01-30 DIAGNOSIS — N2581 Secondary hyperparathyroidism of renal origin: Secondary | ICD-10-CM | POA: Diagnosis not present

## 2023-01-30 DIAGNOSIS — N186 End stage renal disease: Secondary | ICD-10-CM | POA: Diagnosis not present

## 2023-01-30 NOTE — TOC Transition Note (Signed)
Transition of Care - Initial Contact from Inpatient Facility  Date of discharge: 01/29/23 Date of contact: 01/30/23  Method: Phone Spoke to: Patient  Contacted patient to discuss transition of care from recent inpatient hospitalization but he didn't answer his phone. Voicemail box was full so unable to leave a message.  Patient received HD at [facility] today. Next HD on 8/12.  Salome Holmes, NP

## 2023-02-01 DIAGNOSIS — D62 Acute posthemorrhagic anemia: Secondary | ICD-10-CM | POA: Diagnosis not present

## 2023-02-01 DIAGNOSIS — T8789 Other complications of amputation stump: Secondary | ICD-10-CM | POA: Diagnosis not present

## 2023-02-01 DIAGNOSIS — E1151 Type 2 diabetes mellitus with diabetic peripheral angiopathy without gangrene: Secondary | ICD-10-CM | POA: Diagnosis not present

## 2023-02-01 DIAGNOSIS — Z89512 Acquired absence of left leg below knee: Secondary | ICD-10-CM | POA: Diagnosis not present

## 2023-02-01 DIAGNOSIS — I70222 Atherosclerosis of native arteries of extremities with rest pain, left leg: Secondary | ICD-10-CM | POA: Diagnosis not present

## 2023-02-01 DIAGNOSIS — I132 Hypertensive heart and chronic kidney disease with heart failure and with stage 5 chronic kidney disease, or end stage renal disease: Secondary | ICD-10-CM | POA: Diagnosis not present

## 2023-02-01 DIAGNOSIS — E1122 Type 2 diabetes mellitus with diabetic chronic kidney disease: Secondary | ICD-10-CM | POA: Diagnosis not present

## 2023-02-01 DIAGNOSIS — I5032 Chronic diastolic (congestive) heart failure: Secondary | ICD-10-CM | POA: Diagnosis not present

## 2023-02-01 DIAGNOSIS — E1140 Type 2 diabetes mellitus with diabetic neuropathy, unspecified: Secondary | ICD-10-CM | POA: Diagnosis not present

## 2023-02-02 ENCOUNTER — Ambulatory Visit (INDEPENDENT_AMBULATORY_CARE_PROVIDER_SITE_OTHER): Payer: Medicare HMO | Admitting: Physician Assistant

## 2023-02-02 VITALS — BP 127/57 | HR 60 | Temp 97.8°F | Resp 18 | Ht 70.0 in | Wt 253.0 lb

## 2023-02-02 DIAGNOSIS — I70262 Atherosclerosis of native arteries of extremities with gangrene, left leg: Secondary | ICD-10-CM

## 2023-02-02 DIAGNOSIS — Z89512 Acquired absence of left leg below knee: Secondary | ICD-10-CM

## 2023-02-02 NOTE — Progress Notes (Signed)
POST OPERATIVE OFFICE NOTE    CC:  F/u for surgery  HPI:  This is a 67 y.o. male who is s/p left below knee amputation on 01/26/23 by Dr. Lenell Antu. Unfortunately he had a left diabetic foot infection with non healing toe amputation and no revascularization options.   Pt returns today with his wife for follow up.  Pt states he is mostly having pain in his left stump at night. He is only really needing pain medication at night to help with this. He does also say that he feels like he can still wiggle his toes. Not really experiencing any phantom pains. He starts PT tomorrow. Has been keeping leg in knee immobilizer since D/c. He is medically managed on Statin, Plavix and Eliquis.   No Known Allergies  Current Outpatient Medications  Medication Sig Dispense Refill   albuterol (VENTOLIN HFA) 108 (90 Base) MCG/ACT inhaler Inhale 2 puffs into the lungs every 6 (six) hours as needed for wheezing or shortness of breath.     amiodarone (PACERONE) 200 MG tablet Take 1 tablet (200 mg total) by mouth 2 (two) times daily. 180 tablet 3   atorvastatin (LIPITOR) 80 MG tablet Take 1 tablet (80 mg total) by mouth daily. 90 tablet 2   B Complex-C-Folic Acid (DIALYVITE 800) 0.8 MG TABS Take 0.8 mg by mouth daily.     clopidogrel (PLAVIX) 75 MG tablet Take 1 tablet (75 mg total) by mouth daily. 30 tablet 11   Dulaglutide (TRULICITY) 3 MG/0.5ML SOPN Inject 3 mg into the skin every Wednesday.     ELIQUIS 5 MG TABS tablet TAKE 1 TABLET TWICE DAILY 180 tablet 3   fluticasone furoate-vilanterol (BREO ELLIPTA) 200-25 MCG/ACT AEPB Inhale 1 puff into the lungs daily.  3   HYDROcodone-acetaminophen (NORCO/VICODIN) 5-325 MG tablet Take 1-2 tablets by mouth every 6 (six) hours as needed for up to 5 days for moderate pain or severe pain. 20 tablet 0   insulin glargine (LANTUS) 100 UNIT/ML injection Inject 38 Units into the skin 2 (two) times daily.     Insulin Pen Needle 32G X 4 MM MISC Use to inject Levemir 2 (two) times  daily. 100 each 1   Methoxy PEG-Epoetin Beta (MIRCERA IJ) Mircera     metoprolol succinate (TOPROL-XL) 25 MG 24 hr tablet Take 25 mg by mouth daily.     midodrine (PROAMATINE) 5 MG tablet Take 1 tablet (5 mg total) by mouth daily. 90 tablet 3   promethazine (PHENERGAN) 12.5 MG tablet Take 12.5 mg by mouth 3 (three) times daily as needed.     torsemide (DEMADEX) 20 MG tablet Take 1 tablet (20 mg total) by mouth daily. 90 tablet 1   Current Facility-Administered Medications  Medication Dose Route Frequency Provider Last Rate Last Admin   0.9 %  sodium chloride infusion  250 mL Intravenous PRN Maeola Harman, MD       0.9 %  sodium chloride infusion  250 mL Intravenous PRN [name], MD       sodium chloride flush (NS) 0.9 % injection 3 mL  3 mL Intravenous Q12H Maeola Harman, MD       sodium chloride flush (NS) 0.9 % injection 3 mL  3 mL Intravenous Q12H [name], MD         ROS:  See HPI  Physical Exam:  Vitals:   02/02/23 0917  BP: (!) 127/57  Pulse: 60  Resp: 18  Temp: 97.8 F (36.6 C)  SpO2: 97%   General: well appearing, in no acute distress Incision:  Left BKA staples intact, bloody oozing. Viable flaps. No appreciable fluid collections Neuro: alert and oriented   Assessment/Plan:  This is a 67 y.o. male who is s/p: left BKA. Viable flaps. Bloody oozing from staple line. He does have some blisters of skin from the [place]. Left BKA prevena VAC removed today. Staple incision very oozy. He is on both Eliquis and Plavix. 4x4s, ABD, and Ace applied. Instructed patient to leave this on for 48 hours. Then start changing dressings daily as needed - He will keep his follow  up on 9/3 for staple removal   Nathanial Rancher, [facility] [phone]   Clinic MD:  Myra Gianotti

## 2023-02-03 ENCOUNTER — Telehealth: Payer: Self-pay

## 2023-02-03 DIAGNOSIS — E1140 Type 2 diabetes mellitus with diabetic neuropathy, unspecified: Secondary | ICD-10-CM | POA: Diagnosis not present

## 2023-02-03 DIAGNOSIS — I132 Hypertensive heart and chronic kidney disease with heart failure and with stage 5 chronic kidney disease, or end stage renal disease: Secondary | ICD-10-CM | POA: Diagnosis not present

## 2023-02-03 DIAGNOSIS — D62 Acute posthemorrhagic anemia: Secondary | ICD-10-CM | POA: Diagnosis not present

## 2023-02-03 DIAGNOSIS — T8789 Other complications of amputation stump: Secondary | ICD-10-CM | POA: Diagnosis not present

## 2023-02-03 DIAGNOSIS — I5032 Chronic diastolic (congestive) heart failure: Secondary | ICD-10-CM | POA: Diagnosis not present

## 2023-02-03 DIAGNOSIS — I70222 Atherosclerosis of native arteries of extremities with rest pain, left leg: Secondary | ICD-10-CM | POA: Diagnosis not present

## 2023-02-03 DIAGNOSIS — E1122 Type 2 diabetes mellitus with diabetic chronic kidney disease: Secondary | ICD-10-CM | POA: Diagnosis not present

## 2023-02-03 DIAGNOSIS — Z89512 Acquired absence of left leg below knee: Secondary | ICD-10-CM | POA: Diagnosis not present

## 2023-02-03 DIAGNOSIS — E1151 Type 2 diabetes mellitus with diabetic peripheral angiopathy without gangrene: Secondary | ICD-10-CM | POA: Diagnosis not present

## 2023-02-03 NOTE — Telephone Encounter (Signed)
Archie Patten, RN with [facility] called requesting verbal orders for 2 wk x 1, 1 wk x 3. She was aware that the Prevena vac was being removed and she wanted an update. She also informed this office that the pt would get extensive DM education to assist with better BG control.  Reviewed pt's chart, returned call for clarification, no answer, lf vm on secure line giving verbal orders and the new wound care orders.

## 2023-02-04 ENCOUNTER — Other Ambulatory Visit: Payer: Self-pay | Admitting: Physician Assistant

## 2023-02-04 ENCOUNTER — Telehealth: Payer: Self-pay | Admitting: *Deleted

## 2023-02-04 DIAGNOSIS — E1151 Type 2 diabetes mellitus with diabetic peripheral angiopathy without gangrene: Secondary | ICD-10-CM | POA: Diagnosis not present

## 2023-02-04 DIAGNOSIS — Z89512 Acquired absence of left leg below knee: Secondary | ICD-10-CM | POA: Diagnosis not present

## 2023-02-04 DIAGNOSIS — G4733 Obstructive sleep apnea (adult) (pediatric): Secondary | ICD-10-CM | POA: Diagnosis not present

## 2023-02-04 DIAGNOSIS — T8789 Other complications of amputation stump: Secondary | ICD-10-CM | POA: Diagnosis not present

## 2023-02-04 DIAGNOSIS — I5032 Chronic diastolic (congestive) heart failure: Secondary | ICD-10-CM | POA: Diagnosis not present

## 2023-02-04 DIAGNOSIS — R0600 Dyspnea, unspecified: Secondary | ICD-10-CM | POA: Diagnosis not present

## 2023-02-04 DIAGNOSIS — E1122 Type 2 diabetes mellitus with diabetic chronic kidney disease: Secondary | ICD-10-CM | POA: Diagnosis not present

## 2023-02-04 DIAGNOSIS — I132 Hypertensive heart and chronic kidney disease with heart failure and with stage 5 chronic kidney disease, or end stage renal disease: Secondary | ICD-10-CM | POA: Diagnosis not present

## 2023-02-04 DIAGNOSIS — I70222 Atherosclerosis of native arteries of extremities with rest pain, left leg: Secondary | ICD-10-CM | POA: Diagnosis not present

## 2023-02-04 DIAGNOSIS — J4540 Moderate persistent asthma, uncomplicated: Secondary | ICD-10-CM | POA: Diagnosis not present

## 2023-02-04 DIAGNOSIS — D62 Acute posthemorrhagic anemia: Secondary | ICD-10-CM | POA: Diagnosis not present

## 2023-02-04 DIAGNOSIS — Z87891 Personal history of nicotine dependence: Secondary | ICD-10-CM | POA: Diagnosis not present

## 2023-02-04 DIAGNOSIS — E1140 Type 2 diabetes mellitus with diabetic neuropathy, unspecified: Secondary | ICD-10-CM | POA: Diagnosis not present

## 2023-02-04 MED ORDER — OXYCODONE-ACETAMINOPHEN 5-325 MG PO TABS: 1.0000 | ORAL_TABLET | Freq: Four times a day (QID) | ORAL | 0 refills | Status: DC | PRN

## 2023-02-04 NOTE — Telephone Encounter (Signed)
Caller: April from [facility]  Concern:  April called from [facility] to confirm dressing changes and needed a verbal order as well as requesting refill on pain medication.  Procedure:  Left BKA  Consulted: Corrie PA  Resolution: Pain medicine called in per standing order and verbal given over phone to wash incision site with soap and water, pat dry, then apply 4x4, ABD, kerlix, Ace wrap daily. Pt is to wait til dressing are off and leg is no longer bleeding before placing shrinker

## 2023-02-05 ENCOUNTER — Encounter (HOSPITAL_COMMUNITY): Payer: Self-pay | Admitting: Internal Medicine

## 2023-02-05 ENCOUNTER — Encounter (HOSPITAL_COMMUNITY): Payer: Self-pay

## 2023-02-05 ENCOUNTER — Inpatient Hospital Stay (HOSPITAL_COMMUNITY): Payer: Medicare HMO

## 2023-02-05 ENCOUNTER — Inpatient Hospital Stay (HOSPITAL_COMMUNITY)
Admission: AD | Admit: 2023-02-05 | Discharge: 2023-02-12 | DRG: 474 | Disposition: A | Discharge status: Home Health | Payer: Medicare HMO | Source: Other Acute Inpatient Hospital | Attending: Internal Medicine | Admitting: Internal Medicine

## 2023-02-05 ENCOUNTER — Other Ambulatory Visit: Payer: Self-pay

## 2023-02-05 DIAGNOSIS — I120 Hypertensive chronic kidney disease with stage 5 chronic kidney disease or end stage renal disease: Secondary | ICD-10-CM | POA: Diagnosis not present

## 2023-02-05 DIAGNOSIS — Z813 Family history of other psychoactive substance abuse and dependence: Secondary | ICD-10-CM

## 2023-02-05 DIAGNOSIS — G8918 Other acute postprocedural pain: Secondary | ICD-10-CM | POA: Diagnosis not present

## 2023-02-05 DIAGNOSIS — Z79899 Other long term (current) drug therapy: Secondary | ICD-10-CM | POA: Diagnosis not present

## 2023-02-05 DIAGNOSIS — E1140 Type 2 diabetes mellitus with diabetic neuropathy, unspecified: Secondary | ICD-10-CM | POA: Diagnosis present

## 2023-02-05 DIAGNOSIS — J449 Chronic obstructive pulmonary disease, unspecified: Secondary | ICD-10-CM | POA: Diagnosis present

## 2023-02-05 DIAGNOSIS — I480 Paroxysmal atrial fibrillation: Secondary | ICD-10-CM | POA: Diagnosis present

## 2023-02-05 DIAGNOSIS — R319 Hematuria, unspecified: Secondary | ICD-10-CM | POA: Diagnosis not present

## 2023-02-05 DIAGNOSIS — M726 Necrotizing fasciitis: Secondary | ICD-10-CM | POA: Diagnosis not present

## 2023-02-05 DIAGNOSIS — I2510 Atherosclerotic heart disease of native coronary artery without angina pectoris: Secondary | ICD-10-CM | POA: Diagnosis present

## 2023-02-05 DIAGNOSIS — I5042 Chronic combined systolic (congestive) and diastolic (congestive) heart failure: Secondary | ICD-10-CM | POA: Diagnosis present

## 2023-02-05 DIAGNOSIS — I519 Heart disease, unspecified: Secondary | ICD-10-CM | POA: Diagnosis present

## 2023-02-05 DIAGNOSIS — T8789 Other complications of amputation stump: Secondary | ICD-10-CM | POA: Diagnosis present

## 2023-02-05 DIAGNOSIS — K219 Gastro-esophageal reflux disease without esophagitis: Secondary | ICD-10-CM | POA: Diagnosis present

## 2023-02-05 DIAGNOSIS — Z794 Long term (current) use of insulin: Secondary | ICD-10-CM | POA: Diagnosis not present

## 2023-02-05 DIAGNOSIS — R339 Retention of urine, unspecified: Secondary | ICD-10-CM | POA: Diagnosis present

## 2023-02-05 DIAGNOSIS — Z7951 Long term (current) use of inhaled steroids: Secondary | ICD-10-CM

## 2023-02-05 DIAGNOSIS — Z87891 Personal history of nicotine dependence: Secondary | ICD-10-CM | POA: Diagnosis not present

## 2023-02-05 DIAGNOSIS — Z992 Dependence on renal dialysis: Secondary | ICD-10-CM | POA: Diagnosis not present

## 2023-02-05 DIAGNOSIS — E782 Mixed hyperlipidemia: Secondary | ICD-10-CM | POA: Diagnosis present

## 2023-02-05 DIAGNOSIS — I132 Hypertensive heart and chronic kidney disease with heart failure and with stage 5 chronic kidney disease, or end stage renal disease: Secondary | ICD-10-CM | POA: Diagnosis not present

## 2023-02-05 DIAGNOSIS — Z801 Family history of malignant neoplasm of trachea, bronchus and lung: Secondary | ICD-10-CM

## 2023-02-05 DIAGNOSIS — I959 Hypotension, unspecified: Secondary | ICD-10-CM | POA: Diagnosis not present

## 2023-02-05 DIAGNOSIS — N186 End stage renal disease: Secondary | ICD-10-CM | POA: Diagnosis not present

## 2023-02-05 DIAGNOSIS — Z89512 Acquired absence of left leg below knee: Secondary | ICD-10-CM | POA: Diagnosis not present

## 2023-02-05 DIAGNOSIS — I517 Cardiomegaly: Secondary | ICD-10-CM | POA: Diagnosis not present

## 2023-02-05 DIAGNOSIS — E1122 Type 2 diabetes mellitus with diabetic chronic kidney disease: Secondary | ICD-10-CM | POA: Diagnosis present

## 2023-02-05 DIAGNOSIS — M7981 Nontraumatic hematoma of soft tissue: Secondary | ICD-10-CM | POA: Diagnosis present

## 2023-02-05 DIAGNOSIS — Z6836 Body mass index (BMI) 36.0-36.9, adult: Secondary | ICD-10-CM

## 2023-02-05 DIAGNOSIS — L03116 Cellulitis of left lower limb: Secondary | ICD-10-CM | POA: Diagnosis not present

## 2023-02-05 DIAGNOSIS — E669 Obesity, unspecified: Secondary | ICD-10-CM | POA: Diagnosis present

## 2023-02-05 DIAGNOSIS — N2581 Secondary hyperparathyroidism of renal origin: Secondary | ICD-10-CM | POA: Diagnosis present

## 2023-02-05 DIAGNOSIS — Y835 Amputation of limb(s) as the cause of abnormal reaction of the patient, or of later complication, without mention of misadventure at the time of the procedure: Secondary | ICD-10-CM | POA: Diagnosis present

## 2023-02-05 DIAGNOSIS — D631 Anemia in chronic kidney disease: Secondary | ICD-10-CM | POA: Diagnosis present

## 2023-02-05 DIAGNOSIS — D649 Anemia, unspecified: Secondary | ICD-10-CM | POA: Diagnosis not present

## 2023-02-05 DIAGNOSIS — T8744 Infection of amputation stump, left lower extremity: Secondary | ICD-10-CM | POA: Diagnosis not present

## 2023-02-05 DIAGNOSIS — K521 Toxic gastroenteritis and colitis: Secondary | ICD-10-CM | POA: Diagnosis present

## 2023-02-05 DIAGNOSIS — T368X5A Adverse effect of other systemic antibiotics, initial encounter: Secondary | ICD-10-CM | POA: Diagnosis present

## 2023-02-05 DIAGNOSIS — Z7902 Long term (current) use of antithrombotics/antiplatelets: Secondary | ICD-10-CM

## 2023-02-05 DIAGNOSIS — Z951 Presence of aortocoronary bypass graft: Secondary | ICD-10-CM | POA: Diagnosis not present

## 2023-02-05 DIAGNOSIS — Z8249 Family history of ischemic heart disease and other diseases of the circulatory system: Secondary | ICD-10-CM

## 2023-02-05 DIAGNOSIS — R0602 Shortness of breath: Secondary | ICD-10-CM | POA: Diagnosis not present

## 2023-02-05 DIAGNOSIS — I10 Essential (primary) hypertension: Secondary | ICD-10-CM | POA: Diagnosis present

## 2023-02-05 DIAGNOSIS — G4733 Obstructive sleep apnea (adult) (pediatric): Secondary | ICD-10-CM | POA: Diagnosis present

## 2023-02-05 DIAGNOSIS — T8781 Dehiscence of amputation stump: Secondary | ICD-10-CM | POA: Diagnosis not present

## 2023-02-05 DIAGNOSIS — Z7901 Long term (current) use of anticoagulants: Secondary | ICD-10-CM

## 2023-02-05 DIAGNOSIS — I499 Cardiac arrhythmia, unspecified: Secondary | ICD-10-CM | POA: Diagnosis present

## 2023-02-05 DIAGNOSIS — R10819 Abdominal tenderness, unspecified site: Secondary | ICD-10-CM | POA: Diagnosis not present

## 2023-02-05 DIAGNOSIS — I700 Atherosclerosis of aorta: Secondary | ICD-10-CM | POA: Diagnosis not present

## 2023-02-05 DIAGNOSIS — E1152 Type 2 diabetes mellitus with diabetic peripheral angiopathy with gangrene: Secondary | ICD-10-CM | POA: Diagnosis not present

## 2023-02-05 DIAGNOSIS — N189 Chronic kidney disease, unspecified: Secondary | ICD-10-CM | POA: Diagnosis not present

## 2023-02-05 DIAGNOSIS — T8740 Infection of amputation stump, unspecified extremity: Secondary | ICD-10-CM | POA: Diagnosis not present

## 2023-02-05 DIAGNOSIS — I5032 Chronic diastolic (congestive) heart failure: Secondary | ICD-10-CM | POA: Diagnosis not present

## 2023-02-05 DIAGNOSIS — M7989 Other specified soft tissue disorders: Secondary | ICD-10-CM | POA: Diagnosis not present

## 2023-02-05 DIAGNOSIS — M25562 Pain in left knee: Secondary | ICD-10-CM | POA: Diagnosis not present

## 2023-02-05 DIAGNOSIS — L539 Erythematous condition, unspecified: Secondary | ICD-10-CM | POA: Diagnosis not present

## 2023-02-05 LAB — CBC WITH DIFFERENTIAL/PLATELET
Abs Immature Granulocytes: 0.45 10*3/uL — ABNORMAL HIGH (ref 0.00–0.07)
Basophils Absolute: 0.1 10*3/uL (ref 0.0–0.1)
Basophils Relative: 0 %
Eosinophils Absolute: 0.2 10*3/uL (ref 0.0–0.5)
Eosinophils Relative: 1 %
HCT: 23.3 % — ABNORMAL LOW (ref 39.0–52.0)
Hemoglobin: 7.2 g/dL — ABNORMAL LOW (ref 13.0–17.0)
Immature Granulocytes: 2 %
Lymphocytes Relative: 4 %
Lymphs Abs: 1 10*3/uL (ref 0.7–4.0)
MCH: 26.9 pg (ref 26.0–34.0)
MCHC: 30.9 g/dL (ref 30.0–36.0)
MCV: 86.9 fL (ref 80.0–100.0)
Monocytes Absolute: 2 10*3/uL — ABNORMAL HIGH (ref 0.1–1.0)
Monocytes Relative: 9 %
Neutro Abs: 20 10*3/uL — ABNORMAL HIGH (ref 1.7–7.7)
Neutrophils Relative %: 84 %
Platelets: 492 10*3/uL — ABNORMAL HIGH (ref 150–400)
RBC: 2.68 MIL/uL — ABNORMAL LOW (ref 4.22–5.81)
RDW: 20.8 % — ABNORMAL HIGH (ref 11.5–15.5)
WBC: 23.7 10*3/uL — ABNORMAL HIGH (ref 4.0–10.5)
nRBC: 0 % (ref 0.0–0.2)

## 2023-02-05 LAB — BRAIN NATRIURETIC PEPTIDE: B Natriuretic Peptide: 553.7 pg/mL — ABNORMAL HIGH (ref 0.0–100.0)

## 2023-02-05 LAB — COMPREHENSIVE METABOLIC PANEL
ALT: 119 U/L — ABNORMAL HIGH (ref 0–44)
AST: 326 U/L — ABNORMAL HIGH (ref 15–41)
Albumin: 2.2 g/dL — ABNORMAL LOW (ref 3.5–5.0)
Alkaline Phosphatase: 98 U/L (ref 38–126)
Anion gap: 16 — ABNORMAL HIGH (ref 5–15)
BUN: 90 mg/dL — ABNORMAL HIGH (ref 8–23)
CO2: 20 mmol/L — ABNORMAL LOW (ref 22–32)
Calcium: 7.4 mg/dL — ABNORMAL LOW (ref 8.9–10.3)
Chloride: 101 mmol/L (ref 98–111)
Creatinine, Ser: 7.48 mg/dL — ABNORMAL HIGH (ref 0.61–1.24)
GFR, Estimated: 7 mL/min — ABNORMAL LOW (ref >60)
Glucose, Bld: 78 mg/dL (ref 70–99)
Potassium: 3.8 mmol/L (ref 3.5–5.1)
Sodium: 137 mmol/L (ref 135–145)
Total Bilirubin: 1 mg/dL (ref 0.3–1.2)
Total Protein: 6.3 g/dL — ABNORMAL LOW (ref 6.5–8.1)

## 2023-02-05 LAB — SEDIMENTATION RATE: Sed Rate: 114 mm/hr — ABNORMAL HIGH (ref 0–16)

## 2023-02-05 LAB — MAGNESIUM: Magnesium: 2.1 mg/dL (ref 1.7–2.4)

## 2023-02-05 LAB — GLUCOSE, CAPILLARY
Glucose-Capillary: 81 mg/dL (ref 70–99)
Glucose-Capillary: 107 mg/dL — ABNORMAL HIGH (ref 70–99)
Glucose-Capillary: 99 mg/dL (ref 70–99)

## 2023-02-05 LAB — TSH: TSH: 1.384 u[IU]/mL (ref 0.350–4.500)

## 2023-02-05 LAB — PROCALCITONIN: Procalcitonin: 0.25 ng/mL

## 2023-02-05 LAB — C-REACTIVE PROTEIN: CRP: 7 mg/dL — ABNORMAL HIGH (ref <1.0)

## 2023-02-05 LAB — LACTIC ACID, PLASMA
Lactic Acid, Venous: 1.1 mmol/L (ref 0.5–1.9)
Lactic Acid, Venous: 1.1 mmol/L (ref 0.5–1.9)

## 2023-02-05 LAB — PHOSPHORUS: Phosphorus: 6.2 mg/dL — ABNORMAL HIGH (ref 2.5–4.6)

## 2023-02-05 MED ORDER — LORAZEPAM 0.5 MG PO TABS
0.5000 mg | ORAL_TABLET | Freq: Once | ORAL | Status: AC
  Administered 2023-02-06: 0.5 mg via ORAL
  Filled 2023-02-05: qty 1

## 2023-02-05 MED ORDER — OXYCODONE HCL 5 MG PO TABS
5.0000 mg | ORAL_TABLET | ORAL | Status: DC | PRN
  Administered 2023-02-05 – 2023-02-12 (×16): 5 mg via ORAL
  Filled 2023-02-05 (×16): qty 1

## 2023-02-05 MED ORDER — VANCOMYCIN VARIABLE DOSE PER UNSTABLE RENAL FUNCTION (PHARMACIST DOSING): Status: DC

## 2023-02-05 MED ORDER — INSULIN GLARGINE-YFGN 100 UNIT/ML ~~LOC~~ SOLN
10.0000 [IU] | Freq: Two times a day (BID) | SUBCUTANEOUS | Status: DC
  Administered 2023-02-05 – 2023-02-12 (×13): 10 [IU] via SUBCUTANEOUS
  Filled 2023-02-05 (×17): qty 0.1

## 2023-02-05 MED ORDER — ONDANSETRON HCL 4 MG/2ML IJ SOLN: 4.0000 mg | Freq: Four times a day (QID) | INTRAMUSCULAR | Status: DC | PRN

## 2023-02-05 MED ORDER — ONDANSETRON HCL 4 MG PO TABS: 4.0000 mg | ORAL_TABLET | Freq: Four times a day (QID) | ORAL | Status: DC | PRN

## 2023-02-05 MED ORDER — HEPARIN (PORCINE) 25000 UT/250ML-% IV SOLN
1800.0000 [IU]/h | INTRAVENOUS | Status: DC
  Administered 2023-02-05: 1400 [IU]/h via INTRAVENOUS
  Administered 2023-02-06 – 2023-02-09 (×5): 1800 [IU]/h via INTRAVENOUS
  Filled 2023-02-05 (×6): qty 250

## 2023-02-05 MED ORDER — PIPERACILLIN-TAZOBACTAM IN DEX 2-0.25 GM/50ML IV SOLN
2.2500 g | Freq: Three times a day (TID) | INTRAVENOUS | Status: DC
  Administered 2023-02-05 – 2023-02-12 (×19): 2.25 g via INTRAVENOUS
  Filled 2023-02-05 (×23): qty 50

## 2023-02-05 MED ORDER — INSULIN ASPART 100 UNIT/ML IJ SOLN
0.0000 [IU] | Freq: Three times a day (TID) | INTRAMUSCULAR | Status: DC
  Administered 2023-02-06 – 2023-02-12 (×7): 1 [IU] via SUBCUTANEOUS

## 2023-02-05 MED ORDER — VANCOMYCIN HCL 2000 MG/400ML IV SOLN
2000.0000 mg | Freq: Once | INTRAVENOUS | Status: AC
  Administered 2023-02-05: 2000 mg via INTRAVENOUS
  Filled 2023-02-05: qty 400

## 2023-02-05 MED ORDER — ALBUTEROL SULFATE (2.5 MG/3ML) 0.083% IN NEBU: 2.5000 mg | INHALATION_SOLUTION | RESPIRATORY_TRACT | Status: DC | PRN

## 2023-02-05 MED ORDER — MORPHINE SULFATE (PF) 2 MG/ML IV SOLN
2.0000 mg | INTRAVENOUS | Status: DC | PRN
  Administered 2023-02-10 – 2023-02-12 (×5): 2 mg via INTRAVENOUS
  Filled 2023-02-05 (×5): qty 1

## 2023-02-05 NOTE — H&P (Addendum)
History and Physical    [name] [name] ZOX:[id number] DOB: 18-Dec-1955 DOA: 02/05/2023  PCP: [name], PA-C  Patient coming from: [facility]  I have personally briefly reviewed patient's old medical records in [facility]  Chief Complaint: left lower extremity  HPI: [name] [name] is a 67 y.o. male with medical history significant of   significant of PAD, diabetes, hypertension, hyperlipidemia, atrial fibrillation, CAD status post CABG, obesity, OSA, anemia, ESRD on HD, neuropathy, CHF , with recent admission 8/5-8/8 for elective  left lower extremity BKA due to progressive infected nonhealing diabetic ulcer in setting of PVD. Per notes patient had achieved source control  with viable wound edges and there was no further need for antibiotic treatment on discharge.  Patient also has interim history of f/u with vascular on 8/12 at which time per notes: flap noted to be Viable ... Blood oozing from staple line... blisters of skin also noted. S/p wound vac removal.  Patient has dressing applied  with plan for f/u on 9/3.  Patient states over the last 24-48 hours he has started to experience increase swelling /redness, oozing as well as pain.  He also notes subjective fever's but no chills.  He notes no other complaints but does not chronic sob/doe/orthopnea. He also noted episode of nausea and vomiting last of wish was on arrival to floor.   ED course Unable at this as patient at outside hospital  Record not available at this time.  Review of Systems: As per HPI otherwise 10 point review of systems negative.   Past Medical History:  Diagnosis Date   Acute kidney failure, unspecified (HCC) 12/05/2021   Anemia 04/17/2022   Blood transfusion   Atherosclerotic heart disease    Benign essential HTN 07/30/2014   Bradycardia 01/25/2015   CHF (congestive heart failure) (HCC)    Chronic anticoagulation 02/25/2018   Chronic systolic CHF (congestive heart failure), NYHA class 3 (HCC)  08/30/2014   Overview:  Global ef 30%   CKD (chronic kidney disease)    Class 3 severe obesity in adult [facility]) 04/28/2017   Coronary artery disease    Diabetes mellitus without complication (HCC)    Diabetic neuropathy (HCC) 07/28/2014   Dysrhythmia    Erectile dysfunction    GERD (gastroesophageal reflux disease)    High risk medication use 05/26/2018   Hypertensive heart disease with heart failure (HCC) 07/30/2014   Leukocytosis 11/26/2021   Lumbago    LV dysfunction 04/28/2017   Mixed hyperlipidemia 07/28/2014   OSA (obstructive sleep apnea)    CPAP   Paroxysmal atrial fibrillation (HCC) 07/28/2014   Postop check 01/06/2022   S/P CABG x 3 11/12/2021   Sinus node dysfunction (HCC) 04/21/2016   Testicular hypofunction    Uncontrolled type 2 diabetes mellitus with microalbuminuric diabetic nephropathy 07/28/2014    Past Surgical History:  Procedure Laterality Date   ABDOMINAL AORTOGRAM W/LOWER EXTREMITY N/A 12/29/2022   Procedure: ABDOMINAL AORTOGRAM W/LOWER EXTREMITY;  Surgeon: Maeola Harman, MD;  Location: [facility] INVASIVE CV LAB;  Service: Cardiovascular;  Laterality: N/A;   ABDOMINAL AORTOGRAM W/LOWER EXTREMITY N/A 01/22/2023   Procedure: ABDOMINAL AORTOGRAM W/LOWER EXTREMITY;  Surgeon: [name], MD;  Location: [facility] INVASIVE CV LAB;  Service: Cardiovascular;  Laterality: N/A;   AMPUTATION Left 01/26/2023   Procedure: LEFT AMPUTATION BELOW KNEE;  Surgeon: Leonie Isaac, MD;  Location: [facility] OR;  Service: Vascular;  Laterality: Left;  with regional block   APPLICATION OF WOUND VAC Left 01/26/2023   Procedure: APPLICATION  OF WOUND VAC, LEFT BELOW THE KNEE AMPUTATION STUMP;  Surgeon: Leonie Myquan, MD;  Location: [facility] OR;  Service: Vascular;  Laterality: Left;  With regional block   AV FISTULA PLACEMENT Right 05/20/2022   Procedure: RIGHT ARM ARTERIOVENOUS (AV) FISTULA CREATION;  Surgeon: Larina Earthly, MD;  Location: [facility] OR;  Service: Vascular;  Laterality: Right;    BUBBLE STUDY  11/21/2021   Procedure: BUBBLE STUDY;  Surgeon: Laurey Morale, MD;  Location: [facility] ENDOSCOPY;  Service: Cardiovascular;;   CARDIAC CATHETERIZATION     CARDIOVERSION N/A 11/21/2021   Procedure: CARDIOVERSION;  Surgeon: Laurey Morale, MD;  Location: [facility] ENDOSCOPY;  Service: Cardiovascular;  Laterality: N/A;   CARDIOVERSION N/A 11/25/2021   Procedure: CARDIOVERSION;  Surgeon: Laurey Morale, MD;  Location: [facility] ENDOSCOPY;  Service: Cardiovascular;  Laterality: N/A;   CARDIOVERSION N/A 11/29/2021   Procedure: CARDIOVERSION;  Surgeon: Laurey Morale, MD;  Location: [facility] ENDOSCOPY;  Service: Cardiovascular;  Laterality: N/A;   CARDIOVERSION N/A 02/27/2022   Procedure: CARDIOVERSION;  Surgeon: Little Ishikawa, MD;  Location: [facility] ENDOSCOPY;  Service: Cardiovascular;  Laterality: N/A;   CORONARY ARTERY BYPASS GRAFT N/A 11/12/2021   Procedure: CORONARY ARTERY BYPASS GRAFTING (CABG) x  3 USING LEFT INTERNAL MAMMARY ARTERY AND RIGHT GREATER SAPHENOUS VEIN;  Surgeon: Lovett Sox, MD;  Location: [facility] OR;  Service: Open Heart Surgery;  Laterality: N/A;   ELECTROPHYSIOLOGIC STUDY N/A 01/18/2015   Procedure: CARDIOVERSION;  Surgeon: Lamar Blinks, MD;  Location: [facility] ORS;  Service: Cardiovascular;  Laterality: N/A;   ENDOVEIN HARVEST OF GREATER SAPHENOUS VEIN  11/12/2021   Procedure: ENDOVEIN HARVEST OF GREATER SAPHENOUS VEIN;  Surgeon: Lovett Sox, MD;  Location: [facility] OR;  Service: Open Heart Surgery;;   EYE SURGERY Bilateral 2022   FISTULA SUPERFICIALIZATION Right 07/10/2022   Procedure: RIGHT ARM ARTERIOVENOUS FISTULA SUPERFICIALIZATION;  Surgeon: Maeola Harman, MD;  Location: [facility] OR;  Service: Vascular;  Laterality: Right;   IR FLUORO GUIDE CV LINE RIGHT  11/28/2021   IR US GUIDE VASC ACCESS RIGHT  11/28/2021   LEFT HEART CATH AND CORONARY ANGIOGRAPHY N/A 10/24/2021   Procedure: LEFT HEART CATH AND CORONARY ANGIOGRAPHY;  Surgeon: Corky Crafts, MD;  Location:  [facility] INVASIVE CV LAB;  Service: Cardiovascular;  Laterality: N/A;   MAZE N/A 11/12/2021   Procedure: MAZE;  Surgeon: Lovett Sox, MD;  Location: [facility] OR;  Service: Open Heart Surgery;  Laterality: N/A;   PERIPHERAL VASCULAR ATHERECTOMY  12/29/2022   Procedure: PERIPHERAL VASCULAR ATHERECTOMY;  Surgeon: Maeola Harman, MD;  Location: [facility] INVASIVE CV LAB;  Service: Cardiovascular;;   PERIPHERAL VASCULAR INTERVENTION  12/29/2022   Procedure: PERIPHERAL VASCULAR INTERVENTION;  Surgeon: Maeola Harman, MD;  Location: [facility] INVASIVE CV LAB;  Service: Cardiovascular;;  Left SFA, Bilateral illiac   TEE WITHOUT CARDIOVERSION N/A 11/12/2021   Procedure: TRANSESOPHAGEAL ECHOCARDIOGRAM (TEE);  Surgeon: Lovett Sox, MD;  Location: [facility] OR;  Service: Open Heart Surgery;  Laterality: N/A;   TEE WITHOUT CARDIOVERSION N/A 11/21/2021   Procedure: TRANSESOPHAGEAL ECHOCARDIOGRAM (TEE);  Surgeon: Laurey Morale, MD;  Location: [facility] ENDOSCOPY;  Service: Cardiovascular;  Laterality: N/A;     reports that he quit smoking about 17 years ago. His smoking use included cigarettes. He has never used smokeless tobacco. He reports that he does not currently use drugs after having used the following drugs: Marijuana. Frequency: 1.00 time per week. He reports that he does not drink alcohol.  No Known Allergies  Family History  Problem Relation  Age of Onset   CAD Brother    Drug abuse Brother    Lung cancer Father    CAD Mother     Prior to Admission medications   Medication Sig Start Date End Date Taking? Authorizing Provider  albuterol (VENTOLIN HFA) 108 (90 Base) MCG/ACT inhaler Inhale 2 puffs into the lungs every 6 (six) hours as needed for wheezing or shortness of breath.    [provider]  amiodarone (PACERONE) 200 MG tablet Take 1 tablet (200 mg total) by mouth 2 (two) times daily. 03/17/22   Baldo Daub, MD  atorvastatin (LIPITOR) 80 MG tablet Take 1 tablet (80 mg total) by mouth  daily. 11/18/22   Baldo Daub, MD  B Complex-C-Folic Acid (DIALYVITE 800) 0.8 MG TABS Take 0.8 mg by mouth daily. 12/16/21   [provider]  clopidogrel (PLAVIX) 75 MG tablet Take 1 tablet (75 mg total) by mouth daily. 12/29/22 12/29/23  Maeola Harman, MD  Dulaglutide (TRULICITY) 3 MG/0.5ML SOPN Inject 3 mg into the skin every Wednesday.    [provider]  ELIQUIS 5 MG TABS tablet TAKE 1 TABLET TWICE DAILY 01/05/23   Baldo Daub, MD  fluticasone furoate-vilanterol (BREO ELLIPTA) 200-25 MCG/ACT AEPB Inhale 1 puff into the lungs daily. 01/02/18   [provider]  insulin glargine (LANTUS) 100 UNIT/ML injection Inject 38 Units into the skin 2 (two) times daily.    [provider]  Insulin Pen Needle 32G X 4 MM MISC Use to inject Levemir 2 (two) times daily. 12/03/21   Doree Fudge M, PA-C  Methoxy PEG-Epoetin Beta (MIRCERA IJ) Mircera 12/12/22 12/11/23  [provider]  metoprolol succinate (TOPROL-XL) 25 MG 24 hr tablet Take 25 mg by mouth daily. 12/26/21   [provider]  midodrine (PROAMATINE) 5 MG tablet Take 1 tablet (5 mg total) by mouth daily. 08/19/22   Baldo Daub, MD  oxyCODONE-acetaminophen (PERCOCET/ROXICET) 5-325 MG tablet Take 1 tablet by mouth every 6 (six) hours as needed for severe pain. 02/04/23   Baglia, Corrina, PA-C  promethazine (PHENERGAN) 12.5 MG tablet Take 12.5 mg by mouth 3 (three) times daily as needed. 01/05/23   [provider]  torsemide (DEMADEX) 20 MG tablet Take 1 tablet (20 mg total) by mouth daily. 01/05/23   Baldo Daub, MD    Physical Exam: Vitals:   02/05/23 0938  BP: 136/60  Pulse: 62  Resp: 18  Temp: 97.8 F (36.6 C)  TempSrc: Oral  SpO2: 99%    Constitutional: NAD, calm, comfortable Vitals:   02/05/23 0938  BP: 136/60  Pulse: 62  Resp: 18  Temp: 97.8 F (36.6 C)  TempSrc: Oral  SpO2: 99%   Eyes: PERRL, lids and conjunctivae normal ENMT: Mucous membranes  are moist. Posterior pharynx clear of any exudate or lesions.Normal dentition.  Neck: normal, supple, no masses, no thyromegaly Respiratory: clear to auscultation bilaterally other than rare wheezing, no crackles. Normal respiratory effort. No accessory muscle use.  Cardiovascular: Regular rate and rhythm, no murmurs / rubs / gallops. No extremity edema. 2+ pedal pulses.  Abdomen: no tenderness, no masses palpated. No hepatosplenomegaly. Bowel sounds positive.  Musculoskeletal: no clubbing / cyanosis. Left bka. Good ROM, no contractures. Normal muscle tone.  Skin: left bka stump with erythema, yellowish drainage, and area of bruising /black discoloration at lateral superior edge. Suture line is intact Neurologic: CN 2-12 grossly intact. Sensation intact, Strength 5/5 in all 4.  Psychiatric: Normal judgment and insight. Alert  and oriented x 3. Normal mood.    Labs on Admission: I have personally reviewed following labs and imaging studies  CBC: No results for input(s): "WBC", "NEUTROABS", "HGB", "HCT", "MCV", "PLT" in the last 168 hours. Basic Metabolic Panel: No results for input(s): "NA", "K", "CL", "CO2", "GLUCOSE", "BUN", "CREATININE", "CALCIUM", "MG", "PHOS" in the last 168 hours. GFR: Estimated Creatinine Clearance: 10 mL/min (A) (by C-G formula based on SCr of 8.95 mg/dL (H)). Liver Function Tests: No results for input(s): "AST", "ALT", "ALKPHOS", "BILITOT", "PROT", "ALBUMIN" in the last 168 hours. No results for input(s): "LIPASE", "AMYLASE" in the last 168 hours. No results for input(s): "AMMONIA" in the last 168 hours. Coagulation Profile: No results for input(s): "INR", "PROTIME" in the last 168 hours. Cardiac Enzymes: No results for input(s): "CKTOTAL", "CKMB", "CKMBINDEX", "TROPONINI" in the last 168 hours. BNP (last 3 results) No results for input(s): "PROBNP" in the last 8760 hours. HbA1C: No results for input(s): "HGBA1C" in the last 72 hours. CBG: Recent Labs  Lab  02/05/23 1009  GLUCAP 81   Lipid Profile: No results for input(s): "CHOL", "HDL", "LDLCALC", "TRIG", "CHOLHDL", "LDLDIRECT" in the last 72 hours. Thyroid Function Tests: No results for input(s): "TSH", "T4TOTAL", "FREET4", "T3FREE", "THYROIDAB" in the last 72 hours. Anemia Panel: No results for input(s): "VITAMINB12", "FOLATE", "FERRITIN", "TIBC", "IRON", "RETICCTPCT" in the last 72 hours. Urine analysis:    Component Value Date/Time   COLORURINE STRAW (A) 11/08/2021 1110   APPEARANCEUR CLEAR 11/08/2021 1110   LABSPEC 1.023 11/08/2021 1110   PHURINE 5.0 11/08/2021 1110   GLUCOSEU >=500 (A) 11/08/2021 1110   HGBUR NEGATIVE 11/08/2021 1110   BILIRUBINUR NEGATIVE 11/08/2021 1110   KETONESUR 5 (A) 11/08/2021 1110   PROTEINUR NEGATIVE 11/08/2021 1110   NITRITE NEGATIVE 11/08/2021 1110   LEUKOCYTESUR TRACE (A) 11/08/2021 1110    Radiological Exams on Admission: No results found.  EKG: Independently reviewed.   Assessment/Plan  Left BKA stump infection  -admit to tele - start broad spectrum abx /vanc/zosyn  - vascular Dr Chestine Spore consulted - crp/esr pending as well as MRI   Stump pain  - start oxycodone -morphine for break through   DMII -stable fs  - resume lantus 10 units bid  - repeat A1C , last was 9.3  - current fs 81  CADs/p CABG HLD  - Continue metoprolol. Statin -resume plavix if no surgical intervention   Chronic HFpEF:  -euvolemic - resume demadex ( patient on HD x 1 year and continued to make urine)  -further fluid management with HD MWF    PAF -currently in sinus - resume amiodarone, metoprolol -resume Eliquis if no vascular intervention required     COPD:  -faint rare wheeze on exam  - resume controller medications -prn nebs -monitor pulmonary status   OSA -cpap qhs  DVT prophylaxis: on OAC Code Status: full/ as discussed per patient wishes in event of cardiac arrest  Family Communication: none at bedside none at bedside Disposition  Plan: patient  expected to be admitted greater than 2 midnights  Consults called: Altamese Dilling Admission status: med tele   Lurline Del MD [facility]   If 7PM-7AM, please contact night-coverage [email or web address] Password [facility]  02/05/2023, 1:34 PM

## 2023-02-05 NOTE — Progress Notes (Signed)
ANTICOAGULATION CONSULT NOTE - Initial Consult  Pharmacy Consult for conversion from apixaban to heparin gtt Indication: atrial fibrillation  No Known Allergies  Patient Measurements: Height: 5\' 10"  (177.8 cm) Weight: 114.8 kg (253 lb) IBW/kg (Calculated) : 73 Heparin Dosing Weight: 98.3 kg  Vital Signs: Temp: 98.2 F (36.8 C) (08/15 1955) Temp Source: Oral (08/15 1955) BP: 133/55 (08/15 1955) Pulse Rate: 67 (08/15 1955)  Labs: Recent Labs    02/05/23 1408  HGB 7.2*  HCT 23.3*  PLT 492*  CREATININE 7.48*    Estimated Creatinine Clearance: 12 mL/min (A) (by C-G formula based on SCr of 7.48 mg/dL (H)).   Medical History: Past Medical History:  Diagnosis Date   Acute kidney failure, unspecified (HCC) 12/05/2021   Anemia 04/17/2022   Blood transfusion   Atherosclerotic heart disease    Benign essential HTN 07/30/2014   Bradycardia 01/25/2015   CHF (congestive heart failure) (HCC)    Chronic anticoagulation 02/25/2018   Chronic systolic CHF (congestive heart failure), NYHA class 3 (HCC) 08/30/2014   Overview:  Global ef 30%   CKD (chronic kidney disease)    Class 3 severe obesity in adult [facility]) 04/28/2017   Coronary artery disease    Diabetes mellitus without complication (HCC)    Diabetic neuropathy (HCC) 07/28/2014   Dysrhythmia    Erectile dysfunction    GERD (gastroesophageal reflux disease)    High risk medication use 05/26/2018   Hypertensive heart disease with heart failure (HCC) 07/30/2014   Leukocytosis 11/26/2021   Lumbago    LV dysfunction 04/28/2017   Mixed hyperlipidemia 07/28/2014   OSA (obstructive sleep apnea)    CPAP   Paroxysmal atrial fibrillation (HCC) 07/28/2014   Postop check 01/06/2022   S/P CABG x 3 11/12/2021   Sinus node dysfunction (HCC) 04/21/2016   Testicular hypofunction    Uncontrolled type 2 diabetes mellitus with microalbuminuric diabetic nephropathy 07/28/2014    Medications:  Facility-Administered Medications Prior  to Admission  Medication Dose Route Frequency Provider Last Rate Last Admin   0.9 %  sodium chloride infusion  250 mL Intravenous PRN Maeola Harman, MD       0.9 %  sodium chloride infusion  250 mL Intravenous PRN [name], MD       sodium chloride flush (NS) 0.9 % injection 3 mL  3 mL Intravenous Q12H Maeola Harman, MD       sodium chloride flush (NS) 0.9 % injection 3 mL  3 mL Intravenous Q12H [name], MD       Medications Prior to Admission  Medication Sig Dispense Refill Last Dose   albuterol (VENTOLIN HFA) 108 (90 Base) MCG/ACT inhaler Inhale 2 puffs into the lungs every 6 (six) hours as needed for wheezing or shortness of breath.      amiodarone (PACERONE) 200 MG tablet Take 1 tablet (200 mg total) by mouth 2 (two) times daily. 180 tablet 3    atorvastatin (LIPITOR) 80 MG tablet Take 1 tablet (80 mg total) by mouth daily. 90 tablet 2    B Complex-C-Folic Acid (DIALYVITE 800) 0.8 MG TABS Take 0.8 mg by mouth daily.      clopidogrel (PLAVIX) 75 MG tablet Take 1 tablet (75 mg total) by mouth daily. 30 tablet 11    Dulaglutide (TRULICITY) 3 MG/0.5ML SOPN Inject 3 mg into the skin every Wednesday.      ELIQUIS 5 MG TABS tablet TAKE 1 TABLET TWICE DAILY 180 tablet 3    fluticasone furoate-vilanterol (  BREO ELLIPTA) 200-25 MCG/ACT AEPB Inhale 1 puff into the lungs daily.  3    insulin glargine (LANTUS) 100 UNIT/ML injection Inject 38 Units into the skin 2 (two) times daily.      Insulin Pen Needle 32G X 4 MM MISC Use to inject Levemir 2 (two) times daily. 100 each 1    Methoxy PEG-Epoetin Beta (MIRCERA IJ) Mircera      metoprolol succinate (TOPROL-XL) 25 MG 24 hr tablet Take 25 mg by mouth daily.      midodrine (PROAMATINE) 5 MG tablet Take 1 tablet (5 mg total) by mouth daily. 90 tablet 3    oxyCODONE-acetaminophen (PERCOCET/ROXICET) 5-325 MG tablet Take 1 tablet by mouth every 6 (six) hours as needed for severe pain. 20 tablet 0    promethazine  (PHENERGAN) 12.5 MG tablet Take 12.5 mg by mouth 3 (three) times daily as needed.      torsemide (DEMADEX) 20 MG tablet Take 1 tablet (20 mg total) by mouth daily. 90 tablet 1    Scheduled:   insulin aspart  0-9 Units Subcutaneous TID WC   insulin glargine-yfgn  10 Units Subcutaneous BID   LORazepam  0.5 mg Oral Once   vancomycin variable dose per unstable renal function (pharmacist dosing)   Does not apply See admin instructions    Assessment: 28 YOM with a recent LLE BKA admitted with increased swelling, redness, oozing and pain. He has a history of pAF and a CHA2DS2-VASc score of 4 on Eliquis PTA. Per patient report, he stated he last took a dose yesterday (8/14) evening - exact time unknown. Vascular surgery is holding on any surgical intervention to see how he responds to antibiotics but would like the Eliquis held in case the patient requires surgical intervention later this admission. A consult for a heparin gtt was requested by PharmD from night covering attending. Agreed and placed consult  Goal of Therapy:  Heparin level 0.3-0.7 units/ml aPTT 66-102 seconds Monitor platelets by anticoagulation protocol: Yes   Plan:  Start heparin infusion at 1400 units/hr Check a heparin level and aPTT in 8 hours and daily while on heparin Discontinue heparin levels once correlate w/ aPTT Continue to monitor H&H and platelets In the event surgery is planned, the heparin infusion will need to be held 6 hours prior to surgery.   J  02/05/2023,8:55 PM

## 2023-02-05 NOTE — Consult Note (Signed)
Hospital Consult    Reason for Consult:  Nonhealing left BKA Referring Physician: Hospitalist MRN #:  [id number]  History of Present Illness: This is a 67 y.o. male with history of ESRD, atrial fibrillation on Eliquis, hypertension, diabetes, PAD that vascular surgery has been consulted for nonhealing left BKA.  Patient most recently underwent left lower extremity intervention by Dr. Randie Heinz for CLI with toe ulceration with laser atherectomy and stent of the SFA and popliteal arteries.  He then had a left toe amputation with podiatry that was nonhealing.  We did do an additional angiogram to try retrograde for revascularization of the anterior tibial was unsuccessful.  He did have two-vessel runoff.  Ultimately required BKA on 01/26/2023 by Dr. Lenell Antu.  Patient went to [facility] yesterday as he states he started having pain in the stump.  He denies any fevers.  He was transferred here and started on antibiotics.  Past Medical History:  Diagnosis Date   Acute kidney failure, unspecified (HCC) 12/05/2021   Anemia 04/17/2022   Blood transfusion   Atherosclerotic heart disease    Benign essential HTN 07/30/2014   Bradycardia 01/25/2015   CHF (congestive heart failure) (HCC)    Chronic anticoagulation 02/25/2018   Chronic systolic CHF (congestive heart failure), NYHA class 3 (HCC) 08/30/2014   Overview:  Global ef 30%   CKD (chronic kidney disease)    Class 3 severe obesity in adult [facility]) 04/28/2017   Coronary artery disease    Diabetes mellitus without complication (HCC)    Diabetic neuropathy (HCC) 07/28/2014   Dysrhythmia    Erectile dysfunction    GERD (gastroesophageal reflux disease)    High risk medication use 05/26/2018   Hypertensive heart disease with heart failure (HCC) 07/30/2014   Leukocytosis 11/26/2021   Lumbago    LV dysfunction 04/28/2017   Mixed hyperlipidemia 07/28/2014   OSA (obstructive sleep apnea)    CPAP   Paroxysmal atrial fibrillation (HCC) 07/28/2014   Postop  check 01/06/2022   S/P CABG x 3 11/12/2021   Sinus node dysfunction (HCC) 04/21/2016   Testicular hypofunction    Uncontrolled type 2 diabetes mellitus with microalbuminuric diabetic nephropathy 07/28/2014    Past Surgical History:  Procedure Laterality Date   ABDOMINAL AORTOGRAM W/LOWER EXTREMITY N/A 12/29/2022   Procedure: ABDOMINAL AORTOGRAM W/LOWER EXTREMITY;  Surgeon: Maeola Harman, MD;  Location: [facility] INVASIVE CV LAB;  Service: Cardiovascular;  Laterality: N/A;   ABDOMINAL AORTOGRAM W/LOWER EXTREMITY N/A 01/22/2023   Procedure: ABDOMINAL AORTOGRAM W/LOWER EXTREMITY;  Surgeon: [name], MD;  Location: [facility] INVASIVE CV LAB;  Service: Cardiovascular;  Laterality: N/A;   AMPUTATION Left 01/26/2023   Procedure: LEFT AMPUTATION BELOW KNEE;  Surgeon: Leonie Early, MD;  Location: [facility] OR;  Service: Vascular;  Laterality: Left;  with regional block   APPLICATION OF WOUND VAC Left 01/26/2023   Procedure: APPLICATION OF WOUND VAC, LEFT BELOW THE KNEE AMPUTATION STUMP;  Surgeon: Leonie Eulalio, MD;  Location: [facility] OR;  Service: Vascular;  Laterality: Left;  With regional block   AV FISTULA PLACEMENT Right 05/20/2022   Procedure: RIGHT ARM ARTERIOVENOUS (AV) FISTULA CREATION;  Surgeon: Larina Earthly, MD;  Location: [facility] OR;  Service: Vascular;  Laterality: Right;   BUBBLE STUDY  11/21/2021   Procedure: BUBBLE STUDY;  Surgeon: Laurey Morale, MD;  Location: [facility] ENDOSCOPY;  Service: Cardiovascular;;   CARDIAC CATHETERIZATION     CARDIOVERSION N/A 11/21/2021   Procedure: CARDIOVERSION;  Surgeon: Laurey Morale, MD;  Location: [facility] ENDOSCOPY;  Service: Cardiovascular;  Laterality: N/A;   CARDIOVERSION N/A 11/25/2021   Procedure: CARDIOVERSION;  Surgeon: Laurey Morale, MD;  Location: [facility] ENDOSCOPY;  Service: Cardiovascular;  Laterality: N/A;   CARDIOVERSION N/A 11/29/2021   Procedure: CARDIOVERSION;  Surgeon: Laurey Morale, MD;  Location: [facility] ENDOSCOPY;  Service: Cardiovascular;   Laterality: N/A;   CARDIOVERSION N/A 02/27/2022   Procedure: CARDIOVERSION;  Surgeon: Little Ishikawa, MD;  Location: [facility] ENDOSCOPY;  Service: Cardiovascular;  Laterality: N/A;   CORONARY ARTERY BYPASS GRAFT N/A 11/12/2021   Procedure: CORONARY ARTERY BYPASS GRAFTING (CABG) x  3 USING LEFT INTERNAL MAMMARY ARTERY AND RIGHT GREATER SAPHENOUS VEIN;  Surgeon: Lovett Sox, MD;  Location: [facility] OR;  Service: Open Heart Surgery;  Laterality: N/A;   ELECTROPHYSIOLOGIC STUDY N/A 01/18/2015   Procedure: CARDIOVERSION;  Surgeon: Lamar Blinks, MD;  Location: [facility] ORS;  Service: Cardiovascular;  Laterality: N/A;   ENDOVEIN HARVEST OF GREATER SAPHENOUS VEIN  11/12/2021   Procedure: ENDOVEIN HARVEST OF GREATER SAPHENOUS VEIN;  Surgeon: Lovett Sox, MD;  Location: [facility] OR;  Service: Open Heart Surgery;;   EYE SURGERY Bilateral 2022   FISTULA SUPERFICIALIZATION Right 07/10/2022   Procedure: RIGHT ARM ARTERIOVENOUS FISTULA SUPERFICIALIZATION;  Surgeon: Maeola Harman, MD;  Location: [facility] OR;  Service: Vascular;  Laterality: Right;   IR FLUORO GUIDE CV LINE RIGHT  11/28/2021   IR US GUIDE VASC ACCESS RIGHT  11/28/2021   LEFT HEART CATH AND CORONARY ANGIOGRAPHY N/A 10/24/2021   Procedure: LEFT HEART CATH AND CORONARY ANGIOGRAPHY;  Surgeon: Corky Crafts, MD;  Location: [facility] INVASIVE CV LAB;  Service: Cardiovascular;  Laterality: N/A;   MAZE N/A 11/12/2021   Procedure: MAZE;  Surgeon: Lovett Sox, MD;  Location: [facility] OR;  Service: Open Heart Surgery;  Laterality: N/A;   PERIPHERAL VASCULAR ATHERECTOMY  12/29/2022   Procedure: PERIPHERAL VASCULAR ATHERECTOMY;  Surgeon: Maeola Harman, MD;  Location: [facility] INVASIVE CV LAB;  Service: Cardiovascular;;   PERIPHERAL VASCULAR INTERVENTION  12/29/2022   Procedure: PERIPHERAL VASCULAR INTERVENTION;  Surgeon: Maeola Harman, MD;  Location: [facility] INVASIVE CV LAB;  Service: Cardiovascular;;  Left SFA, Bilateral illiac   TEE WITHOUT  CARDIOVERSION N/A 11/12/2021   Procedure: TRANSESOPHAGEAL ECHOCARDIOGRAM (TEE);  Surgeon: Lovett Sox, MD;  Location: [facility] OR;  Service: Open Heart Surgery;  Laterality: N/A;   TEE WITHOUT CARDIOVERSION N/A 11/21/2021   Procedure: TRANSESOPHAGEAL ECHOCARDIOGRAM (TEE);  Surgeon: Laurey Morale, MD;  Location: [facility] ENDOSCOPY;  Service: Cardiovascular;  Laterality: N/A;    No Known Allergies  Prior to Admission medications   Medication Sig Start Date End Date Taking? Authorizing Provider  albuterol (VENTOLIN HFA) 108 (90 Base) MCG/ACT inhaler Inhale 2 puffs into the lungs every 6 (six) hours as needed for wheezing or shortness of breath.    [provider]  amiodarone (PACERONE) 200 MG tablet Take 1 tablet (200 mg total) by mouth 2 (two) times daily. 03/17/22   Baldo Daub, MD  atorvastatin (LIPITOR) 80 MG tablet Take 1 tablet (80 mg total) by mouth daily. 11/18/22   Baldo Daub, MD  B Complex-C-Folic Acid (DIALYVITE 800) 0.8 MG TABS Take 0.8 mg by mouth daily. 12/16/21   [provider]  clopidogrel (PLAVIX) 75 MG tablet Take 1 tablet (75 mg total) by mouth daily. 12/29/22 12/29/23  Maeola Harman, MD  Dulaglutide (TRULICITY) 3 MG/0.5ML SOPN Inject 3 mg into the skin every Wednesday.    [provider]  ELIQUIS 5 MG TABS tablet TAKE 1 TABLET  TWICE DAILY 01/05/23   Baldo Daub, MD  fluticasone furoate-vilanterol (BREO ELLIPTA) 200-25 MCG/ACT AEPB Inhale 1 puff into the lungs daily. 01/02/18   [provider]  insulin glargine (LANTUS) 100 UNIT/ML injection Inject 38 Units into the skin 2 (two) times daily.    [provider]  Insulin Pen Needle 32G X 4 MM MISC Use to inject Levemir 2 (two) times daily. 12/03/21   Doree Fudge M, PA-C  Methoxy PEG-Epoetin Beta (MIRCERA IJ) Mircera 12/12/22 12/11/23  [provider]  metoprolol succinate (TOPROL-XL) 25 MG 24 hr tablet Take 25 mg by mouth daily. 12/26/21   [provider]   midodrine (PROAMATINE) 5 MG tablet Take 1 tablet (5 mg total) by mouth daily. 08/19/22   Baldo Daub, MD  oxyCODONE-acetaminophen (PERCOCET/ROXICET) 5-325 MG tablet Take 1 tablet by mouth every 6 (six) hours as needed for severe pain. 02/04/23   Baglia, Corrina, PA-C  promethazine (PHENERGAN) 12.5 MG tablet Take 12.5 mg by mouth 3 (three) times daily as needed. 01/05/23   [provider]  torsemide (DEMADEX) 20 MG tablet Take 1 tablet (20 mg total) by mouth daily. 01/05/23   Baldo Daub, MD    Social History   Socioeconomic History   Marital status: Married    Spouse name: Not on file   Number of children: Not on file   Years of education: Not on file   Highest education level: Not on file  Occupational History   Not on file  Tobacco Use   Smoking status: Former    Current packs/day: 0.00    Types: Cigarettes    Quit date: 2007    Years since quitting: 17.6   Smokeless tobacco: Never  Vaping Use   Vaping status: Never Used  Substance and Sexual Activity   Alcohol use: No   Drug use: Not Currently    Frequency: 1.0 times per week    Types: Marijuana   Sexual activity: Not on file  Other Topics Concern   Not on file  Social History Narrative   Not on file   Social Determinants of Health   Financial Resource Strain: Not on file  Food Insecurity: No Food Insecurity (01/27/2023)   Hunger Vital Sign    Worried About Running Out of Food in the Last Year: Never true    Ran Out of Food in the Last Year: Never true  Transportation Needs: No Transportation Needs (01/27/2023)   PRAPARE - Administrator, Civil Service (Medical): No    Lack of Transportation (Non-Medical): No  Physical Activity: Not on file  Stress: Not on file  Social Connections: Not on file  Intimate Partner Violence: Not At Risk (01/27/2023)   Humiliation, Afraid, Rape, and Kick questionnaire    Fear of Current or Ex-Partner: No    Emotionally Abused: No    Physically Abused: No     Sexually Abused: No     Family History  Problem Relation Age of Onset   CAD Brother    Drug abuse Brother    Lung cancer Father    CAD Mother     ROS: [x]  Positive   [ ]  Negative   [ ]  All sytems reviewed and are negative  Cardiovascular: []  chest pain/pressure []  palpitations []  SOB lying flat []  DOE []  pain in legs while walking []  pain in legs at rest []  pain in legs at night []  non-healing ulcers []  hx of DVT []  swelling in legs  Pulmonary: []  productive cough []  asthma/wheezing []  home O2  Neurologic: []  weakness in []  arms []  legs []  numbness in []  arms []  legs []  hx of CVA []  mini stroke [] difficulty speaking or slurred speech []  temporary loss of vision in one eye []  dizziness  Hematologic: []  hx of cancer []  bleeding problems []  problems with blood clotting easily  Endocrine:   []  diabetes []  thyroid disease  GI []  vomiting blood []  blood in stool  GU: []  CKD/renal failure []  HD--[]  M/W/F or []  T/T/S []  burning with urination []  blood in urine  Psychiatric: []  anxiety []  depression  Musculoskeletal: []  arthritis []  joint pain  Integumentary: []  rashes []  ulcers  Constitutional: []  fever []  chills   Physical Examination  Vitals:   02/05/23 0938 02/05/23 1343  BP: 136/60 (!) 134/107  Pulse: 62 65  Resp: 18 18  Temp: 97.8 F (36.6 C) 98.3 F (36.8 C)  SpO2: 99% 97%   There is no height or weight on file to calculate BMI.  General:  NAD Gait: Not observed HENT: WNL, normocephalic Pulmonary: normal non-labored breathing Cardiac: regular, without  Murmurs, rubs or gallops Abdomen:  soft, NT/ND, no masses Vascular Exam/Pulses:  Left BKA as pictured.  The posterior flap actually looks very healthy.  He has what appear to be pressure areas with wounds from Ioban wrap.  Cellulitis anteriorly as pictured below with ulcer on BKA.      CBC    Component Value Date/Time   WBC 17.4 (H) 01/28/2023 2356   RBC 2.84 (L)  01/28/2023 2356   HGB 7.5 (L) 01/28/2023 2356   HGB 7.6 (L) 04/17/2022 1452   HCT 23.9 (L) 01/28/2023 2356   HCT 25.3 (L) 04/17/2022 1452   PLT 374 01/28/2023 2356   PLT 351 04/17/2022 1452   MCV 84.2 01/28/2023 2356   MCV 88 04/17/2022 1452   MCH 26.4 01/28/2023 2356   MCHC 31.4 01/28/2023 2356   RDW 20.0 (H) 01/28/2023 2356   RDW 15.5 (H) 04/17/2022 1452   LYMPHSABS 1.4 02/18/2022 1117   MONOABS 2.8 (H) 11/26/2021 0436   EOSABS 0.3 02/18/2022 1117   BASOSABS 0.1 02/18/2022 1117    BMET    Component Value Date/Time   NA 130 (L) 01/28/2023 2356   NA 139 04/17/2022 1452   K 3.3 (L) 01/28/2023 2356   CL 94 (L) 01/28/2023 2356   CO2 20 (L) 01/28/2023 2356   GLUCOSE 145 (H) 01/28/2023 2356   BUN 80 (H) 01/28/2023 2356   BUN 42 (H) 04/17/2022 1452   CREATININE 8.95 (H) 01/28/2023 2356   CALCIUM 6.6 (L) 01/28/2023 2356   GFRNONAA 6 (L) 01/28/2023 2356   GFRAA 42 (L) 10/18/2019 1006    COAGS: Lab Results  Component Value Date   INR 1.8 (H) 01/26/2023   INR 1.3 (H) 11/28/2021   INR 1.3 (H) 11/27/2021     Non-Invasive Vascular Imaging:    N/A   ASSESSMENT/PLAN: This is a 67 y.o. male with history of ESRD, atrial fibrillation on Eliquis, hypertension, diabetes, PAD that vascular surgery has been consulted for nonhealing left BKA.  Overall the posterior flap actually looks excellent - which is usually the problem.  He was discharged with a VAC after amputation with Dr. Lenell Antu and had Ioban wrapped around the stump and clearly has signs of blistering and skin breakdown from the dressing.  Would continue broad-spectrum IV antibiotics.  I did mark the cellulitis and we will follow this.  No plans  for operative intervention at this time unless this does not improve with antibiotics.  Then would be at major risk of conversion to an above knee amputation.  I would hold his Eliquis in case he requires operative intervention later this admission.  [name], MD [facility] Office: [phone]  [name]

## 2023-02-05 NOTE — Progress Notes (Signed)
PHARMACY ANTIBIOTIC CONSULT NOTE   [name] [name] a 67 y.o. male who had a L BKA performed 8/5 by [facility].  Pharmacy has been consulted for vancomycin and zosyn dosing for a wound infection. Patient is ESRD with HD MWF.   Estimated Creatinine Clearance: 10 mL/min (A) (by C-G formula based on SCr of 8.95 mg/dL (H)).  Plan: Vancomycin 2,000 mg IV once, then Vancomycin 1,000 mg IV QHD MWF  Piperacillin- tazobactam 2.25 g IV Q8H  F/U HD schedule/tolerability, clinical status, de-escalation, C/S, levels as indicated - no maintenance doses are currently scheduled   Allergies:  No Known Allergies  There were no vitals filed for this visit.     Latest Ref Rng & Units 01/28/2023   11:56 PM 01/27/2023   11:45 PM 01/27/2023    9:58 PM  CBC  WBC 4.0 - 10.5 K/uL 17.4  17.9    Hemoglobin 13.0 - 17.0 g/dL 7.5  7.5  7.9   Hematocrit 39.0 - 52.0 % 23.9  23.4  24.8   Platelets 150 - 400 K/uL 374  347      Antibiotics Given (last 72 hours)     None       Antimicrobials this admission: Vanc 8/15>> Zosyn 8/15>>c   Microbiology results: none  Thank you for allowing pharmacy to be a part of this patient's care.  Jani Gravel, PharmD Clinical Pharmacist  02/05/2023 1:37 PM

## 2023-02-05 NOTE — Progress Notes (Signed)
  History of paroxysmal atrial fibrillation - CHA2DS2-VASc score 4.  At home patient is on Eliquis 5 mg twice daily.  Per chart review of this hospital admission patient possibly will undergo revision surgery of the left BKA site if no improvement with IV antibiotic.  Given it is not questionable to perform the surgical intervention later holding Eliquis for now however starting heparin drip which can be hold 6 to 8 hours before any surgical procedure.  Patient has high risk of a stroke with high CHA2DS2-VASc used to score. Stable H&H 7.2 and 23.  Continue to monitor hemoglobin, APTT and INR. -Start heparin drip for tonight for history of paroxysmal atrial fibrillation.  Need to hold the heparin drip 6 to 8 hours before surgical intervention.   Tereasa Coop, MD [facility] 02/05/2023, 8:50 PM

## 2023-02-06 ENCOUNTER — Inpatient Hospital Stay (HOSPITAL_COMMUNITY): Payer: Medicare HMO

## 2023-02-06 DIAGNOSIS — T8740 Infection of amputation stump, unspecified extremity: Secondary | ICD-10-CM | POA: Diagnosis not present

## 2023-02-06 LAB — APTT
aPTT: 27 s (ref 24–36)
aPTT: 95 seconds — ABNORMAL HIGH (ref 24–36)

## 2023-02-06 LAB — GLUCOSE, CAPILLARY
Glucose-Capillary: 107 mg/dL — ABNORMAL HIGH (ref 70–99)
Glucose-Capillary: 88 mg/dL (ref 70–99)
Glucose-Capillary: 129 mg/dL — ABNORMAL HIGH (ref 70–99)
Glucose-Capillary: 130 mg/dL — ABNORMAL HIGH (ref 70–99)

## 2023-02-06 LAB — COMPREHENSIVE METABOLIC PANEL
ALT: 131 U/L — ABNORMAL HIGH (ref 0–44)
AST: 398 U/L — ABNORMAL HIGH (ref 15–41)
Albumin: 1.9 g/dL — ABNORMAL LOW (ref 3.5–5.0)
Alkaline Phosphatase: 82 U/L (ref 38–126)
Anion gap: 12 (ref 5–15)
BUN: 87 mg/dL — ABNORMAL HIGH (ref 8–23)
CO2: 21 mmol/L — ABNORMAL LOW (ref 22–32)
Calcium: 7.5 mg/dL — ABNORMAL LOW (ref 8.9–10.3)
Chloride: 103 mmol/L (ref 98–111)
Creatinine, Ser: 7.02 mg/dL — ABNORMAL HIGH (ref 0.61–1.24)
GFR, Estimated: 8 mL/min — ABNORMAL LOW (ref >60)
Glucose, Bld: 82 mg/dL (ref 70–99)
Potassium: 3.8 mmol/L (ref 3.5–5.1)
Sodium: 136 mmol/L (ref 135–145)
Total Bilirubin: 1.2 mg/dL (ref 0.3–1.2)
Total Protein: 5.9 g/dL — ABNORMAL LOW (ref 6.5–8.1)

## 2023-02-06 LAB — HEPARIN LEVEL (UNFRACTIONATED): Heparin Unfractionated: >1.1 [IU]/mL — ABNORMAL HIGH (ref 0.30–0.70)

## 2023-02-06 LAB — HEPATITIS B SURFACE ANTIGEN: Hepatitis B Surface Ag: NONREACTIVE

## 2023-02-06 MED ORDER — LIDOCAINE-PRILOCAINE 2.5-2.5 % EX CREA: 1.0000 | TOPICAL_CREAM | CUTANEOUS | Status: DC | PRN

## 2023-02-06 MED ORDER — PENTAFLUOROPROP-TETRAFLUOROETH EX AERO: 1.0000 | INHALATION_SPRAY | CUTANEOUS | Status: DC | PRN

## 2023-02-06 MED ORDER — VANCOMYCIN HCL IN DEXTROSE 1-5 GM/200ML-% IV SOLN
1000.0000 mg | Freq: Once | INTRAVENOUS | Status: AC
  Administered 2023-02-06: 1000 mg via INTRAVENOUS
  Filled 2023-02-06: qty 200

## 2023-02-06 MED ORDER — ALTEPLASE 2 MG IJ SOLR: 2.0000 mg | Freq: Once | INTRAMUSCULAR | Status: DC | PRN

## 2023-02-06 MED ORDER — LIDOCAINE HCL (PF) 1 % IJ SOLN
5.0000 mL | INTRAMUSCULAR | Status: DC | PRN
  Filled 2023-02-06: qty 5

## 2023-02-06 MED ORDER — HEPARIN SODIUM (PORCINE) 1000 UNIT/ML DIALYSIS
20.0000 [IU]/kg | INTRAMUSCULAR | Status: DC | PRN
  Filled 2023-02-06: qty 3

## 2023-02-06 MED ORDER — DARBEPOETIN ALFA 200 MCG/0.4ML IJ SOSY
200.0000 ug | PREFILLED_SYRINGE | INTRAMUSCULAR | Status: DC
  Administered 2023-02-06: 200 ug via SUBCUTANEOUS
  Filled 2023-02-06: qty 0.4

## 2023-02-06 MED ORDER — LIDOCAINE HCL (PF) 1 % IJ SOLN: 5.0000 mL | INTRAMUSCULAR | Status: DC | PRN

## 2023-02-06 MED ORDER — HEPARIN SODIUM (PORCINE) 1000 UNIT/ML DIALYSIS
1000.0000 [IU] | INTRAMUSCULAR | Status: DC | PRN
  Filled 2023-02-06: qty 1

## 2023-02-06 MED ORDER — TORSEMIDE 20 MG PO TABS
40.0000 mg | ORAL_TABLET | Freq: Every day | ORAL | Status: DC
  Administered 2023-02-06 – 2023-02-12 (×6): 40 mg via ORAL
  Filled 2023-02-06 (×5): qty 2

## 2023-02-06 MED ORDER — ANTICOAGULANT SODIUM CITRATE 4% (200MG/5ML) IV SOLN
5.0000 mL | Status: DC | PRN
  Filled 2023-02-06: qty 5

## 2023-02-06 MED ORDER — CHLORHEXIDINE GLUCONATE CLOTH 2 % EX PADS
6.0000 | MEDICATED_PAD | Freq: Every day | CUTANEOUS | Status: DC
  Administered 2023-02-06 – 2023-02-12 (×7): 6 via TOPICAL

## 2023-02-06 MED ORDER — HEPARIN SODIUM (PORCINE) 1000 UNIT/ML DIALYSIS: 1000.0000 [IU] | INTRAMUSCULAR | Status: DC | PRN

## 2023-02-06 MED ORDER — ALTEPLASE 2 MG IJ SOLR
2.0000 mg | Freq: Once | INTRAMUSCULAR | Status: DC | PRN
  Filled 2023-02-06: qty 2

## 2023-02-06 NOTE — Progress Notes (Signed)
[facility] PROGRESS NOTE  [name] [name] (DOB: 1956/02/21) WUJ:[id number] PCP: [name], PA-C  Brief Narrative: [name] [name] is a 67 y.o. male with a history of PAD, T2DM, HTN, HLD, AFib, CAD s/p CABG, OSA, ESRD on HD M&F, CHF, and left BKA on 8/5, discharged home with wound vac on 8/8 who presented to the ED on 02/05/2023 with redness, and painful swelling at the stump site. He was admitted, placed on IV antibiotics, for stump site infection with vascular surgery consulting, hoping to avoid surgery at this time.   Subjective: Seen on HD this PM. He is without complaints. His pain in the left leg is better, less redness per pt too.  Objective: BP (!) 123/50   Pulse (!) 101   Temp 98.2 F (36.8 C) (Oral)   Resp 17   Ht 5\' 10"  (1.778 m)   Wt 114.5 kg   SpO2 98%   BMI 36.22 kg/m   Gen: No distress, resting quietly Pulm: Clear, nonlabored  CV: RRR, no MRG, no RLE edema GI: Soft, NT, ND, +BS  Neuro: Alert and oriented. No new focal deficits. Ext: Left BKA with anterior flap erythema and induration with blistered area laterally as pictured. Both flaps edematous, only mildly tender. RUA accessed.  Assessment & Plan: Principal Problem:   Amputation stump infection (HCC) Active Problems:   LV dysfunction   Paroxysmal atrial fibrillation (HCC)   Chronic anticoagulation   Benign essential HTN   Dysrhythmia   OSA (obstructive sleep apnea)   S/P CABG x 3   Coronary artery disease   End stage renal disease (HCC)   S/P BKA (below knee amputation), left (HCC)  PVD s/p BKA 8/5 with stump site infection:  - Continue vancomycin, zosyn, serial exams per vascular surgery. Holding DOAC.    Anemia of CKD:  - Continue monitoring. ESA per nephrology.   ESRD: s/p AKI during CABG admission, still makes urine. - Continue HD per routine Mon/Fri, appreciate assistance by nephrology. Continue midodrine.   CAD: s/p CABG, HLD:  - Continue metoprolol if BP will allow. Continue statin    Chronic HFpEF:  - Continue home demadex and dialysis, appears euvolemic, will need new EDW   PAF: Remains in NSR/SB - Continue amiodarone, metoprolol.  - Continue IV heparin. Holding eliquis pending clinical trajectory as he may need operation.   IDT2DM: Recent HbA1c 5.9%.  - Glargine 10u BID, SSI. May need to deescalate home regimen.    COPD:  - Continue controller BD, prn  OSA:  - CPAP qHS  Tyrone Nine, MD [facility] [email or web address] 02/06/2023, 2:47 PM

## 2023-02-06 NOTE — Progress Notes (Addendum)
  Progress Note    02/06/2023 7:58 AM * No surgery found *  Subjective:  says left leg is feeling better   Vitals:   02/06/23 0449 02/06/23 0739  BP: (!) 142/60 129/67  Pulse: 66 65  Resp: 18 18  Temp: 98.4 F (36.9 C) 98.6 F (37 C)  SpO2: 97% 100%   Physical Exam: Cardiac:  regular Lungs:  non labored Incisions:  left BKA erythematous. No expansion outside of marked area. no drainage or bleeding  Neurologic: alert and oriented  CBC    Component Value Date/Time   WBC 23.7 (H) 02/05/2023 1408   RBC 2.68 (L) 02/05/2023 1408   HGB 7.2 (L) 02/05/2023 1408   HGB 7.6 (L) 04/17/2022 1452   HCT 23.3 (L) 02/05/2023 1408   HCT 25.3 (L) 04/17/2022 1452   PLT 492 (H) 02/05/2023 1408   PLT 351 04/17/2022 1452   MCV 86.9 02/05/2023 1408   MCV 88 04/17/2022 1452   MCH 26.9 02/05/2023 1408   MCHC 30.9 02/05/2023 1408   RDW 20.8 (H) 02/05/2023 1408   RDW 15.5 (H) 04/17/2022 1452   LYMPHSABS 1.0 02/05/2023 1408   LYMPHSABS 1.4 02/18/2022 1117   MONOABS 2.0 (H) 02/05/2023 1408   EOSABS 0.2 02/05/2023 1408   EOSABS 0.3 02/18/2022 1117   BASOSABS 0.1 02/05/2023 1408   BASOSABS 0.1 02/18/2022 1117    BMET    Component Value Date/Time   NA 137 02/05/2023 1408   NA 139 04/17/2022 1452   K 3.8 02/05/2023 1408   CL 101 02/05/2023 1408   CO2 20 (L) 02/05/2023 1408   GLUCOSE 78 02/05/2023 1408   BUN 90 (H) 02/05/2023 1408   BUN 42 (H) 04/17/2022 1452   CREATININE 7.48 (H) 02/05/2023 1408   CALCIUM 7.4 (L) 02/05/2023 1408   GFRNONAA 7 (L) 02/05/2023 1408   GFRAA 42 (L) 10/18/2019 1006    INR    Component Value Date/Time   INR 1.8 (H) 01/26/2023 1513     Intake/Output Summary (Last 24 hours) at 02/06/2023 0758 Last data filed at 02/06/2023 0755 Gross per 24 hour  Intake 106.93 ml  Output 410 ml  Net -303.07 ml     Assessment/Plan:  67 y.o. male with left BKA with cellulitis  BKA intact. Erythema has not increased On Vanc and Zosyn Hgb 7.2 transfusion per  primary team WBC 23.7. Afebrile Eliquis on hold, on IV heparin Hopefully can avoid surgery but will continue to follow   Graceann Congress, PA-C [facility] [phone] 02/06/2023 7:58 AM  I have seen and evaluated the patient. I agree with the PA note as documented above.  Left BKA looks about the same today with cellulitis and ulcer present as pictured.  Continue broad-spectrum antibiotics on Vanc Zosyn.  Appreciate medicine assistance.  Again discussed if this does not improve on antibiotics would likely be looking at conversion to an above-knee amputation although the posterior flap still looks really good which is usually the problem.  We will follow through the weekend.  Has a persistent leukocytosis with WBC 17 at discharge last week, now 23.    [name], MD [facility] Office: [phone]

## 2023-02-06 NOTE — Progress Notes (Signed)
ANTICOAGULATION CONSULT NOTE - Follow-Up Consult  Pharmacy Consult for conversion from apixaban to heparin gtt Indication: atrial fibrillation  No Known Allergies  Patient Measurements: Height: 5\' 10"  (177.8 cm) Weight: 114.8 kg (253 lb) IBW/kg (Calculated) : 73 Heparin Dosing Weight: 98.3 kg  Vital Signs: Temp: 98.6 F (37 C) (08/16 0739) Temp Source: Oral (08/16 0739) BP: 129/67 (08/16 0739) Pulse Rate: 65 (08/16 0739)  Labs: Recent Labs    02/05/23 1408  HGB 7.2*  HCT 23.3*  PLT 492*  CREATININE 7.48*    Estimated Creatinine Clearance: 12 mL/min (A) (by C-G formula based on SCr of 7.48 mg/dL (H)).   Medical History: Past Medical History:  Diagnosis Date   Acute kidney failure, unspecified (HCC) 12/05/2021   Anemia 04/17/2022   Blood transfusion   Atherosclerotic heart disease    Benign essential HTN 07/30/2014   Bradycardia 01/25/2015   CHF (congestive heart failure) (HCC)    Chronic anticoagulation 02/25/2018   Chronic systolic CHF (congestive heart failure), NYHA class 3 (HCC) 08/30/2014   Overview:  Global ef 30%   CKD (chronic kidney disease)    Class 3 severe obesity in adult [facility]) 04/28/2017   Coronary artery disease    Diabetes mellitus without complication (HCC)    Diabetic neuropathy (HCC) 07/28/2014   Dysrhythmia    Erectile dysfunction    GERD (gastroesophageal reflux disease)    High risk medication use 05/26/2018   Hypertensive heart disease with heart failure (HCC) 07/30/2014   Leukocytosis 11/26/2021   Lumbago    LV dysfunction 04/28/2017   Mixed hyperlipidemia 07/28/2014   OSA (obstructive sleep apnea)    CPAP   Paroxysmal atrial fibrillation (HCC) 07/28/2014   Postop check 01/06/2022   S/P CABG x 3 11/12/2021   Sinus node dysfunction (HCC) 04/21/2016   Testicular hypofunction    Uncontrolled type 2 diabetes mellitus with microalbuminuric diabetic nephropathy 07/28/2014    Medications:  Facility-Administered Medications Prior  to Admission  Medication Dose Route Frequency Provider Last Rate Last Admin   0.9 %  sodium chloride infusion  250 mL Intravenous PRN Maeola Harman, MD       0.9 %  sodium chloride infusion  250 mL Intravenous PRN [name], MD       sodium chloride flush (NS) 0.9 % injection 3 mL  3 mL Intravenous Q12H Maeola Harman, MD       sodium chloride flush (NS) 0.9 % injection 3 mL  3 mL Intravenous Q12H [name], MD       Medications Prior to Admission  Medication Sig Dispense Refill Last Dose   albuterol (VENTOLIN HFA) 108 (90 Base) MCG/ACT inhaler Inhale 2 puffs into the lungs every 6 (six) hours as needed for wheezing or shortness of breath.      amiodarone (PACERONE) 200 MG tablet Take 1 tablet (200 mg total) by mouth 2 (two) times daily. 180 tablet 3    atorvastatin (LIPITOR) 80 MG tablet Take 1 tablet (80 mg total) by mouth daily. 90 tablet 2    B Complex-C-Folic Acid (DIALYVITE 800) 0.8 MG TABS Take 0.8 mg by mouth daily.      clopidogrel (PLAVIX) 75 MG tablet Take 1 tablet (75 mg total) by mouth daily. 30 tablet 11    Dulaglutide (TRULICITY) 3 MG/0.5ML SOPN Inject 3 mg into the skin every Wednesday.      ELIQUIS 5 MG TABS tablet TAKE 1 TABLET TWICE DAILY 180 tablet 3    fluticasone furoate-vilanterol (  BREO ELLIPTA) 200-25 MCG/ACT AEPB Inhale 1 puff into the lungs daily.  3    insulin glargine (LANTUS) 100 UNIT/ML injection Inject 38 Units into the skin 2 (two) times daily.      Insulin Pen Needle 32G X 4 MM MISC Use to inject Levemir 2 (two) times daily. 100 each 1    Methoxy PEG-Epoetin Beta (MIRCERA IJ) Mircera      metoprolol succinate (TOPROL-XL) 25 MG 24 hr tablet Take 25 mg by mouth daily.      midodrine (PROAMATINE) 5 MG tablet Take 1 tablet (5 mg total) by mouth daily. 90 tablet 3    oxyCODONE-acetaminophen (PERCOCET/ROXICET) 5-325 MG tablet Take 1 tablet by mouth every 6 (six) hours as needed for severe pain. 20 tablet 0    promethazine  (PHENERGAN) 12.5 MG tablet Take 12.5 mg by mouth 3 (three) times daily as needed.      torsemide (DEMADEX) 20 MG tablet Take 1 tablet (20 mg total) by mouth daily. 90 tablet 1    Scheduled:   insulin aspart  0-9 Units Subcutaneous TID WC   insulin glargine-yfgn  10 Units Subcutaneous BID   LORazepam  0.5 mg Oral Once   vancomycin variable dose per unstable renal function (pharmacist dosing)   Does not apply See admin instructions    Assessment: 62 YOM with a recent LLE BKA admitted with increased swelling, redness, oozing and pain. He has a history of pAF and a CHA2DS2-VASc score of 4 on Eliquis PTA. Per patient report, he stated he last took a dose 8/14 PM- exact time unknown. Vascular surgery is holding on any surgical intervention to see how he responds to antibiotics but would like the Eliquis held in case the patient requires surgical intervention later this admission. Pharmacy has been consulted for heparin dosing.   8/16 AM: aPTT subtherapeutic at 27s and anti-Xa level falsely elevated in setting of recent apixaban use. RN and patient confirm the heparin infusion has been running all night without issue. Patient reported he had some pink tinge to his urine. With aPTT in a normal range, would not suspect heparin to be contributing too much in the event he did have some bleeding. Hgb was low on admit at 7.2, PLT 492.   Goal of Therapy:  Heparin level 0.3-0.7 units/ml aPTT 66-102 seconds Monitor platelets by anticoagulation protocol: Yes   Plan:  Start heparin infusion at 1,800 units/hr (no bolus)  Check aPTT in 8 hours and daily while on heparin Discontinue heparin levels once correlate w/ aPTT Continue to monitor H&H and platelets In the event surgery is planned, the heparin infusion will need to be held 6 hours prior to surgery.  Jani Gravel, PharmD Clinical Pharmacist  02/06/2023 7:49 AM

## 2023-02-06 NOTE — Progress Notes (Signed)
 Pt receives out-pt HD at [facility] on Monday and Friday. Will assist as needed.   Olivia Canter Renal Navigator [phone]

## 2023-02-06 NOTE — Progress Notes (Signed)
ANTICOAGULATION CONSULT NOTE - Follow-Up Consult  Pharmacy Consult for conversion from apixaban to heparin gtt Indication: atrial fibrillation  No Known Allergies  Patient Measurements: Height: 5\' 10"  (177.8 cm) Weight: 112.3 kg (247 lb 9.2 oz) IBW/kg (Calculated) : 73 Heparin Dosing Weight: 98.3 kg  Vital Signs: Temp: 99.2 F (37.3 C) (08/16 2017) Temp Source: Oral (08/16 2017) BP: 104/46 (08/16 2017) Pulse Rate: 99 (08/16 2040)  Labs: Recent Labs    02/05/23 1408 02/06/23 0733 02/06/23 2008  HGB 7.2*  --   --   HCT 23.3*  --   --   PLT 492*  --   --   APTT  --  27 95*  HEPARINUNFRC  --  >1.10*  --   CREATININE 7.48* 7.02*  --     Estimated Creatinine Clearance: 12.6 mL/min (A) (by C-G formula based on SCr of 7.02 mg/dL (H)).   Medical History: Past Medical History:  Diagnosis Date   Acute kidney failure, unspecified (HCC) 12/05/2021   Anemia 04/17/2022   Blood transfusion   Atherosclerotic heart disease    Benign essential HTN 07/30/2014   Bradycardia 01/25/2015   CHF (congestive heart failure) (HCC)    Chronic anticoagulation 02/25/2018   Chronic systolic CHF (congestive heart failure), NYHA class 3 (HCC) 08/30/2014   Overview:  Global ef 30%   CKD (chronic kidney disease)    Class 3 severe obesity in adult [facility]) 04/28/2017   Coronary artery disease    Diabetes mellitus without complication (HCC)    Diabetic neuropathy (HCC) 07/28/2014   Dysrhythmia    Erectile dysfunction    GERD (gastroesophageal reflux disease)    High risk medication use 05/26/2018   Hypertensive heart disease with heart failure (HCC) 07/30/2014   Leukocytosis 11/26/2021   Lumbago    LV dysfunction 04/28/2017   Mixed hyperlipidemia 07/28/2014   OSA (obstructive sleep apnea)    CPAP   Paroxysmal atrial fibrillation (HCC) 07/28/2014   Postop check 01/06/2022   S/P CABG x 3 11/12/2021   Sinus node dysfunction (HCC) 04/21/2016   Testicular hypofunction    Uncontrolled type 2  diabetes mellitus with microalbuminuric diabetic nephropathy 07/28/2014    Medications:  Facility-Administered Medications Prior to Admission  Medication Dose Route Frequency Provider Last Rate Last Admin   0.9 %  sodium chloride infusion  250 mL Intravenous PRN Maeola Harman, MD       0.9 %  sodium chloride infusion  250 mL Intravenous PRN [name], MD       sodium chloride flush (NS) 0.9 % injection 3 mL  3 mL Intravenous Q12H Maeola Harman, MD       sodium chloride flush (NS) 0.9 % injection 3 mL  3 mL Intravenous Q12H [name], MD       Medications Prior to Admission  Medication Sig Dispense Refill Last Dose   albuterol (VENTOLIN HFA) 108 (90 Base) MCG/ACT inhaler Inhale 2 puffs into the lungs every 6 (six) hours as needed for wheezing or shortness of breath.      amiodarone (PACERONE) 200 MG tablet Take 1 tablet (200 mg total) by mouth 2 (two) times daily. 180 tablet 3    atorvastatin (LIPITOR) 80 MG tablet Take 1 tablet (80 mg total) by mouth daily. 90 tablet 2    B Complex-C-Folic Acid (DIALYVITE 800) 0.8 MG TABS Take 0.8 mg by mouth daily.      clopidogrel (PLAVIX) 75 MG tablet Take 1 tablet (75 mg total) by  mouth daily. 30 tablet 11    Dulaglutide (TRULICITY) 3 MG/0.5ML SOPN Inject 3 mg into the skin every Wednesday.      ELIQUIS 5 MG TABS tablet TAKE 1 TABLET TWICE DAILY 180 tablet 3    fluticasone furoate-vilanterol (BREO ELLIPTA) 200-25 MCG/ACT AEPB Inhale 1 puff into the lungs daily.  3    insulin glargine (LANTUS) 100 UNIT/ML injection Inject 38 Units into the skin 2 (two) times daily.      Insulin Pen Needle 32G X 4 MM MISC Use to inject Levemir 2 (two) times daily. 100 each 1    Methoxy PEG-Epoetin Beta (MIRCERA IJ) Mircera      metoprolol succinate (TOPROL-XL) 25 MG 24 hr tablet Take 25 mg by mouth daily.      midodrine (PROAMATINE) 5 MG tablet Take 1 tablet (5 mg total) by mouth daily. 90 tablet 3    oxyCODONE-acetaminophen  (PERCOCET/ROXICET) 5-325 MG tablet Take 1 tablet by mouth every 6 (six) hours as needed for severe pain. 20 tablet 0    promethazine (PHENERGAN) 12.5 MG tablet Take 12.5 mg by mouth 3 (three) times daily as needed.      torsemide (DEMADEX) 20 MG tablet Take 1 tablet (20 mg total) by mouth daily. 90 tablet 1    Scheduled:   Chlorhexidine Gluconate Cloth  6 each Topical Q0600   darbepoetin (ARANESP) injection - DIALYSIS  200 mcg Subcutaneous Q Fri-1800   insulin aspart  0-9 Units Subcutaneous TID WC   insulin glargine-yfgn  10 Units Subcutaneous BID   torsemide  40 mg Oral Daily   vancomycin variable dose per unstable renal function (pharmacist dosing)   Does not apply See admin instructions    Assessment: 62 YOM with a recent LLE BKA admitted with increased swelling, redness, oozing and pain. He has a history of pAF and a CHA2DS2-VASc score of 4 on Eliquis PTA. Per patient report, he stated he last took a dose 8/14 PM- exact time unknown. Vascular surgery is holding on any surgical intervention to see how he responds to antibiotics but would like the Eliquis held in case the patient requires surgical intervention later this admission. Pharmacy has been consulted for heparin dosing.   8/16 PM: aPTT 95 sec therapeutic on 1800 units/hr.  No issues with infusion or reports of hematuria / bleeding per RN.    Goal of Therapy:  Heparin level 0.3-0.7 units/ml aPTT 66-102 seconds Monitor platelets by anticoagulation protocol: Yes   Plan:  Continue heparin infusion 1,800 units/hr  Check aPTT / HL with AM labs Discontinue heparin levels once correlate w/ aPTT Continue to monitor H&H and platelets In the event surgery is planned, the heparin infusion will need to be held 6 hours prior to surgery.  Trixie Rude, PharmD Clinical Pharmacist 02/06/2023  9:08 PM  Please check AMION for all [facility] Pharmacy phone numbers After 10:00 PM, call Main Pharmacy [phone]

## 2023-02-06 NOTE — Progress Notes (Addendum)
Received patient in bed.Awake,alert and oriented x 4.Consent verified.  Medicine : Vancomycin 1,000 mg = 200c cc.  Acces used : Right  arm AVF that worked well.  Duration of treatment:3.5 hours.  Fluid removed: 2.2 liters.  Hemo comment:Tolerated treatment  well.  Hand off to the patient's nurse.

## 2023-02-06 NOTE — Consult Note (Signed)
[facility] Renal Consultation Note    Indication for Consultation:  Management of ESRD/hemodialysis; anemia, hypertension/volume and secondary hyperparathyroidism   HPI: [name] [name] is a 67 y.o. male with ESRD on HD, DM, HTN, Afib on Eliquis,  CAD s/p CABG, PVD s/p L BKA on 01/26/23. He is admitted with concern for BKA site infection. Antibiotics started Vascular surgery consulted.  Labs on admission Na 137, K 3.8 BUN 90 Cr 7.48, Ca 7.4, Phos 6.2. WBC 23.7, Hb 7.2.   Dialysis at [facility]. Dialysis 2x/week on Monday/Friday. Last dialysis was 8/9. Missed Monday d/t appointment. Minimal UF with dialysis usually. Reports good UOP. Foley in place now.  Seen and examined at bedside. He's alert, eating. Feels comfortable. Denies dyspnea/chest pain.  Past Medical History:  Diagnosis Date   Acute kidney failure, unspecified (HCC) 12/05/2021   Anemia 04/17/2022   Blood transfusion   Atherosclerotic heart disease    Benign essential HTN 07/30/2014   Bradycardia 01/25/2015   CHF (congestive heart failure) (HCC)    Chronic anticoagulation 02/25/2018   Chronic systolic CHF (congestive heart failure), NYHA class 3 (HCC) 08/30/2014   Overview:  Global ef 30%   CKD (chronic kidney disease)    Class 3 severe obesity in adult [facility]) 04/28/2017   Coronary artery disease    Diabetes mellitus without complication (HCC)    Diabetic neuropathy (HCC) 07/28/2014   Dysrhythmia    Erectile dysfunction    GERD (gastroesophageal reflux disease)    High risk medication use 05/26/2018   Hypertensive heart disease with heart failure (HCC) 07/30/2014   Leukocytosis 11/26/2021   Lumbago    LV dysfunction 04/28/2017   Mixed hyperlipidemia 07/28/2014   OSA (obstructive sleep apnea)    CPAP   Paroxysmal atrial fibrillation (HCC) 07/28/2014   Postop check 01/06/2022   S/P CABG x 3 11/12/2021   Sinus node dysfunction (HCC) 04/21/2016   Testicular hypofunction    Uncontrolled  type 2 diabetes mellitus with microalbuminuric diabetic nephropathy 07/28/2014   Past Surgical History:  Procedure Laterality Date   ABDOMINAL AORTOGRAM W/LOWER EXTREMITY N/A 12/29/2022   Procedure: ABDOMINAL AORTOGRAM W/LOWER EXTREMITY;  Surgeon: Maeola Harman, MD;  Location[facility] INVASIVE CV LAB;  Service: Cardiovascular;  Laterality: N/A;   ABDOMINAL AORTOGRAM W/LOWER EXTREMITY N/A 01/22/2023   Procedure: ABDOMINAL AORTOGRAM W/LOWER EXTREMITY;  Surgeon: [name], MD;  Location: [facility] INVASIVE CV LAB;  Service: Cardiovascular;  Laterality: N/A;   AMPUTATION Left 01/26/2023   Procedure: LEFT AMPUTATION BELOW KNEE;  Surgeon: Leonie Anthoney, MD;  Location: [facility] OR;  Service: Vascular;  Laterality: Left;  with regional block   APPLICATION OF WOUND VAC Left 01/26/2023   Procedure: APPLICATION OF WOUND VAC, LEFT BELOW THE KNEE AMPUTATION STUMP;  Surgeon: Leonie Edwar, MD;  Location: [facility] OR;  Service: Vascular;  Laterality: Left;  With regional block   AV FISTULA PLACEMENT Right 05/20/2022   Procedure: RIGHT ARM ARTERIOVENOUS (AV) FISTULA CREATION;  Surgeon: Larina Earthly, MD;  Location: [facility] OR;  Service: Vascular;  Laterality: Right;   BUBBLE STUDY  11/21/2021   Procedure: BUBBLE STUDY;  Surgeon: Laurey Morale, MD;  Location: [facility] ENDOSCOPY;  Service: Cardiovascular;;   CARDIAC CATHETERIZATION     CARDIOVERSION N/A 11/21/2021   Procedure: CARDIOVERSION;  Surgeon: Laurey Morale, MD;  Location: [facility] ENDOSCOPY;  Service: Cardiovascular;  Laterality: N/A;   CARDIOVERSION N/A 11/25/2021   Procedure: CARDIOVERSION;  Surgeon: Laurey Morale, MD;  Location: [facility] ENDOSCOPY;  Service:  Cardiovascular;  Laterality: N/A;   CARDIOVERSION N/A 11/29/2021   Procedure: CARDIOVERSION;  Surgeon: Laurey Morale, MD;  Location: [facility] ENDOSCOPY;  Service: Cardiovascular;  Laterality: N/A;   CARDIOVERSION N/A 02/27/2022   Procedure: CARDIOVERSION;  Surgeon: Little Ishikawa, MD;  Location: [facility]  ENDOSCOPY;  Service: Cardiovascular;  Laterality: N/A;   CORONARY ARTERY BYPASS GRAFT N/A 11/12/2021   Procedure: CORONARY ARTERY BYPASS GRAFTING (CABG) x  3 USING LEFT INTERNAL MAMMARY ARTERY AND RIGHT GREATER SAPHENOUS VEIN;  Surgeon: Lovett Sox, MD;  Location: [facility] OR;  Service: Open Heart Surgery;  Laterality: N/A;   ELECTROPHYSIOLOGIC STUDY N/A 01/18/2015   Procedure: CARDIOVERSION;  Surgeon: Lamar Blinks, MD;  Location: [facility] ORS;  Service: Cardiovascular;  Laterality: N/A;   ENDOVEIN HARVEST OF GREATER SAPHENOUS VEIN  11/12/2021   Procedure: ENDOVEIN HARVEST OF GREATER SAPHENOUS VEIN;  Surgeon: Lovett Sox, MD;  Location: [facility] OR;  Service: Open Heart Surgery;;   EYE SURGERY Bilateral 2022   FISTULA SUPERFICIALIZATION Right 07/10/2022   Procedure: RIGHT ARM ARTERIOVENOUS FISTULA SUPERFICIALIZATION;  Surgeon: Maeola Harman, MD;  Location: [facility] OR;  Service: Vascular;  Laterality: Right;   IR FLUORO GUIDE CV LINE RIGHT  11/28/2021   IR US GUIDE VASC ACCESS RIGHT  11/28/2021   LEFT HEART CATH AND CORONARY ANGIOGRAPHY N/A 10/24/2021   Procedure: LEFT HEART CATH AND CORONARY ANGIOGRAPHY;  Surgeon: Corky Crafts, MD;  Location: [facility] INVASIVE CV LAB;  Service: Cardiovascular;  Laterality: N/A;   MAZE N/A 11/12/2021   Procedure: MAZE;  Surgeon: Lovett Sox, MD;  Location: [facility] OR;  Service: Open Heart Surgery;  Laterality: N/A;   PERIPHERAL VASCULAR ATHERECTOMY  12/29/2022   Procedure: PERIPHERAL VASCULAR ATHERECTOMY;  Surgeon: Maeola Harman, MD;  Location: [facility] INVASIVE CV LAB;  Service: Cardiovascular;;   PERIPHERAL VASCULAR INTERVENTION  12/29/2022   Procedure: PERIPHERAL VASCULAR INTERVENTION;  Surgeon: Maeola Harman, MD;  Location: [facility] INVASIVE CV LAB;  Service: Cardiovascular;;  Left SFA, Bilateral illiac   TEE WITHOUT CARDIOVERSION N/A 11/12/2021   Procedure: TRANSESOPHAGEAL ECHOCARDIOGRAM (TEE);  Surgeon: Lovett Sox, MD;  Location: [facility] OR;   Service: Open Heart Surgery;  Laterality: N/A;   TEE WITHOUT CARDIOVERSION N/A 11/21/2021   Procedure: TRANSESOPHAGEAL ECHOCARDIOGRAM (TEE);  Surgeon: Laurey Morale, MD;  Location: [facility] ENDOSCOPY;  Service: Cardiovascular;  Laterality: N/A;   Family History  Problem Relation Age of Onset   CAD Brother    Drug abuse Brother    Lung cancer Father    CAD Mother    Social History:  reports that he quit smoking about 17 years ago. His smoking use included cigarettes. He has never used smokeless tobacco. He reports that he does not currently use drugs after having used the following drugs: Marijuana. Frequency: 1.00 time per week. He reports that he does not drink alcohol. No Known Allergies Prior to Admission medications   Medication Sig Start Date End Date Taking? Authorizing Provider  albuterol (VENTOLIN HFA) 108 (90 Base) MCG/ACT inhaler Inhale 2 puffs into the lungs every 6 (six) hours as needed for wheezing or shortness of breath.    [provider]  amiodarone (PACERONE) 200 MG tablet Take 1 tablet (200 mg total) by mouth 2 (two) times daily. 03/17/22   Baldo Daub, MD  atorvastatin (LIPITOR) 80 MG tablet Take 1 tablet (80 mg total) by mouth daily. 11/18/22   Baldo Daub, MD  B Complex-C-Folic Acid (DIALYVITE 800) 0.8 MG TABS Take 0.8 mg by mouth daily. 12/16/21  [provider]  clopidogrel (PLAVIX) 75 MG tablet Take 1 tablet (75 mg total) by mouth daily. 12/29/22 12/29/23  Maeola Harman, MD  Dulaglutide (TRULICITY) 3 MG/0.5ML SOPN Inject 3 mg into the skin every Wednesday.    [provider]  ELIQUIS 5 MG TABS tablet TAKE 1 TABLET TWICE DAILY 01/05/23   Baldo Daub, MD  fluticasone furoate-vilanterol (BREO ELLIPTA) 200-25 MCG/ACT AEPB Inhale 1 puff into the lungs daily. 01/02/18   [provider]  insulin glargine (LANTUS) 100 UNIT/ML injection Inject 38 Units into the skin 2 (two) times daily.    [provider]  Insulin Pen  Needle 32G X 4 MM MISC Use to inject Levemir 2 (two) times daily. 12/03/21   Doree Fudge M, PA-C  Methoxy PEG-Epoetin Beta (MIRCERA IJ) Mircera 12/12/22 12/11/23  [provider]  metoprolol succinate (TOPROL-XL) 25 MG 24 hr tablet Take 25 mg by mouth daily. 12/26/21   [provider]  midodrine (PROAMATINE) 5 MG tablet Take 1 tablet (5 mg total) by mouth daily. 08/19/22   Baldo Daub, MD  oxyCODONE-acetaminophen (PERCOCET/ROXICET) 5-325 MG tablet Take 1 tablet by mouth every 6 (six) hours as needed for severe pain. 02/04/23   Baglia, Corrina, PA-C  promethazine (PHENERGAN) 12.5 MG tablet Take 12.5 mg by mouth 3 (three) times daily as needed. 01/05/23   [provider]  torsemide (DEMADEX) 20 MG tablet Take 1 tablet (20 mg total) by mouth daily. 01/05/23   Baldo Daub, MD   Current Facility-Administered Medications  Medication Dose Route Frequency Provider Last Rate Last Admin   albuterol (PROVENTIL) (2.5 MG/3ML) 0.083% nebulizer solution 2.5 mg  2.5 mg Nebulization Q2H PRN Skip Mayer A, MD       heparin ADULT infusion 100 units/mL (25000 units/264mL)  1,400 Units/hr Intravenous Continuous Reome, Earle J, RPH 14 mL/hr at 02/05/23 2251 1,400 Units/hr at 02/05/23 2251   insulin aspart (novoLOG) injection 0-9 Units  0-9 Units Subcutaneous TID WC Skip Mayer A, MD       insulin glargine-yfgn (SEMGLEE) injection 10 Units  10 Units Subcutaneous BID Lurline Del, MD   10 Units at 02/06/23 0805   morphine (PF) 2 MG/ML injection 2 mg  2 mg Intravenous Q4H PRN Lurline Del, MD       ondansetron [facility]) tablet 4 mg  4 mg Oral Q6H PRN Lurline Del, MD       Or   ondansetron [facility]) injection 4 mg  4 mg Intravenous Q6H PRN Skip Mayer A, MD       oxyCODONE (Oxy IR/ROXICODONE) immediate release tablet 5 mg  5 mg Oral Q4H PRN Skip Mayer A, MD   5 mg at 02/05/23 2143   piperacillin-tazobactam (ZOSYN) IVPB 2.25 g  2.25 g Intravenous  Q8H Paytes, Austin A, RPH 100 mL/hr at 02/06/23 0806 2.25 g at 02/06/23 0806   vancomycin variable dose per unstable renal function (pharmacist dosing)   Does not apply See admin instructions Paytes, Austin A, RPH         ROS: As per HPI otherwise negative.  Physical Exam: Vitals:   02/05/23 1423 02/05/23 1955 02/06/23 0449 02/06/23 0739  BP:  (!) 133/55 (!) 142/60 129/67  Pulse:  67 66 65  Resp:  18 18 18   Temp:  98.2 F (36.8 C) 98.4 F (36.9 C) 98.6 F (37 C)  TempSrc:  Oral Oral Oral  SpO2:  98% 97% 100%  Weight: 114.8 kg  Height: 5\' 10"  (1.778 m)        General: Appears comfortable, in no distress  Head: NCAT sclera not icteric MMM Lungs: Clear bilaterally. Normal WOB  Heart: RRR, no murmur, rub, or gallop  Abdomen: soft non-tender  Lower extremities: L BKA with surrounding erythema/small ulcer near incision  GU: Foley in place, tea colored urine  Neuro: A & O X 3. Moves all extremities spontaneously. Psych:  Responds to questions appropriately with a normal affect. Dialysis Access:R AVF +bruit   Labs: Basic Metabolic Panel: Recent Labs  Lab 02/05/23 1408  NA 137  K 3.8  CL 101  CO2 20*  GLUCOSE 78  BUN 90*  CREATININE 7.48*  CALCIUM 7.4*  PHOS 6.2*   Liver Function Tests: Recent Labs  Lab 02/05/23 1408  AST 326*  ALT 119*  ALKPHOS 98  BILITOT 1.0  PROT 6.3*  ALBUMIN 2.2*   No results for input(s): "LIPASE", "AMYLASE" in the last 168 hours. No results for input(s): "AMMONIA" in the last 168 hours. CBC: Recent Labs  Lab 02/05/23 1408  WBC 23.7*  NEUTROABS 20.0*  HGB 7.2*  HCT 23.3*  MCV 86.9  PLT 492*   Cardiac Enzymes: No results for input(s): "CKTOTAL", "CKMB", "CKMBINDEX", "TROPONINI" in the last 168 hours. CBG: Recent Labs  Lab 02/05/23 1009 02/05/23 1525 02/05/23 1958 02/06/23 0740  GLUCAP 81 107* 99 107*   Iron Studies: No results for input(s): "IRON", "TIBC", "TRANSFERRIN", "FERRITIN" in the last 72  hours. Studies/Results: DG CHEST PORT 1 VIEW  Result Date: 02/05/2023 CLINICAL DATA:  Shortness of breath. EXAM: PORTABLE CHEST 1 VIEW COMPARISON:  Chest radiograph dated 01/06/2022. FINDINGS: Minimal left lung base atelectasis/scarring. No consolidative changes. There is no pleural effusion or pneumothorax. Stable cardiomegaly. Median sternotomy wires and CABG vascular clips. Atherosclerotic calcification of the aorta. No acute osseous pathology. IMPRESSION: 1. No active disease. 2. Stable cardiomegaly. Electronically Signed   By: Elgie Collard M.D.   On: 02/05/2023 17:42    Dialysis Orders:  [facility] MF 3:30 400/600 EDW 113.6 kg 3K/2.5 Ca AVF No heparin Mircera 200 q 2 wks to start 8/16    Assessment/Plan: S/p L BKA 8/5. Now admitted with poor wound healing/concern for infection. On Vanc/Zosyn. Vascular surgery following  ESRD -  HD Mon/Fri. Usual HD today. Follow UOP.  Hypertension/volume  - Blood pressure controlled. Try for some UF today. Follow weights  Anemia  - Hgb 7.2 s/p transfusion last admit. Resume ESA today.  Metabolic bone disease -  Corr Ca ok. Follow Phos  HFpEF - Continue torsemide 40 mg qd T2DM - Insulin per primary  Afib - Eliquis on hold. On amiodarone/metoprolol   Tomasa Blase PA-C [facility] 02/06/2023, 8:25 AM

## 2023-02-07 DIAGNOSIS — T8740 Infection of amputation stump, unspecified extremity: Secondary | ICD-10-CM | POA: Diagnosis not present

## 2023-02-07 LAB — RENAL FUNCTION PANEL
Albumin: 1.8 g/dL — ABNORMAL LOW (ref 3.5–5.0)
Anion gap: 12 (ref 5–15)
BUN: 43 mg/dL — ABNORMAL HIGH (ref 8–23)
CO2: 26 mmol/L (ref 22–32)
Calcium: 7.4 mg/dL — ABNORMAL LOW (ref 8.9–10.3)
Chloride: 96 mmol/L — ABNORMAL LOW (ref 98–111)
Creatinine, Ser: 4.28 mg/dL — ABNORMAL HIGH (ref 0.61–1.24)
GFR, Estimated: 14 mL/min — ABNORMAL LOW (ref >60)
Glucose, Bld: 126 mg/dL — ABNORMAL HIGH (ref 70–99)
Phosphorus: 4.3 mg/dL (ref 2.5–4.6)
Potassium: 3.5 mmol/L (ref 3.5–5.1)
Sodium: 134 mmol/L — ABNORMAL LOW (ref 135–145)

## 2023-02-07 LAB — CBC
HCT: 20.9 % — ABNORMAL LOW (ref 39.0–52.0)
Hemoglobin: 6.5 g/dL — CL (ref 13.0–17.0)
MCH: 26.4 pg (ref 26.0–34.0)
MCHC: 31.1 g/dL (ref 30.0–36.0)
MCV: 85 fL (ref 80.0–100.0)
Platelets: 405 10*3/uL — ABNORMAL HIGH (ref 150–400)
RBC: 2.46 MIL/uL — ABNORMAL LOW (ref 4.22–5.81)
RDW: 20.5 % — ABNORMAL HIGH (ref 11.5–15.5)
WBC: 21.2 10*3/uL — ABNORMAL HIGH (ref 4.0–10.5)
nRBC: 0 % (ref 0.0–0.2)

## 2023-02-07 LAB — GLUCOSE, CAPILLARY
Glucose-Capillary: 106 mg/dL — ABNORMAL HIGH (ref 70–99)
Glucose-Capillary: 126 mg/dL — ABNORMAL HIGH (ref 70–99)

## 2023-02-07 LAB — PREPARE RBC (CROSSMATCH)

## 2023-02-07 LAB — HEPARIN LEVEL (UNFRACTIONATED): Heparin Unfractionated: >1.1 [IU]/mL — ABNORMAL HIGH (ref 0.30–0.70)

## 2023-02-07 LAB — APTT: aPTT: 87 s — ABNORMAL HIGH (ref 24–36)

## 2023-02-07 MED ORDER — MIDODRINE HCL 5 MG PO TABS
5.0000 mg | ORAL_TABLET | Freq: Every day | ORAL | Status: DC
  Administered 2023-02-08 – 2023-02-09 (×2): 5 mg via ORAL
  Filled 2023-02-07 (×2): qty 1

## 2023-02-07 MED ORDER — VANCOMYCIN HCL IN DEXTROSE 1-5 GM/200ML-% IV SOLN
1000.0000 mg | INTRAVENOUS | Status: DC
  Administered 2023-02-09: 1000 mg via INTRAVENOUS
  Filled 2023-02-07: qty 200

## 2023-02-07 MED ORDER — SODIUM CHLORIDE 0.9% IV SOLUTION: Freq: Once | INTRAVENOUS | Status: AC

## 2023-02-07 MED ORDER — LOPERAMIDE HCL 2 MG PO CAPS
2.0000 mg | ORAL_CAPSULE | ORAL | Status: DC | PRN
  Administered 2023-02-09: 2 mg via ORAL
  Filled 2023-02-07: qty 1

## 2023-02-07 MED ORDER — MIDODRINE HCL 5 MG PO TABS
5.0000 mg | ORAL_TABLET | Freq: Every day | ORAL | Status: DC
  Administered 2023-02-07: 5 mg via ORAL
  Filled 2023-02-07: qty 1

## 2023-02-07 NOTE — Progress Notes (Signed)
  [facility] Progress Note   Subjective: Seen in room. No new complaints. Completed dialysis yesterday, minimal UF. No cramping. No concerns today.   Objective Vitals:   02/07/23 0742 02/07/23 0828 02/07/23 1053 02/07/23 1126  BP: (!) 124/58 (!) 116/50 (!) 122/57 (!) 115/58  Pulse: 90 93 87 88  Resp:  18  18  Temp: 98.7 F (37.1 C) 98.5 F (36.9 C) 98.7 F (37.1 C) 98.6 F (37 C)  TempSrc: Oral Oral Oral Oral  SpO2: 100% 97% 97% 100%  Weight:      Height:          Additional Objective Labs: Basic Metabolic Panel: Recent Labs  Lab 02/05/23 1408 02/06/23 0733 02/07/23 0207  NA 137 136 134*  K 3.8 3.8 3.5  CL 101 103 96*  CO2 20* 21* 26  GLUCOSE 78 82 126*  BUN 90* 87* 43*  CREATININE 7.48* 7.02* 4.28*  CALCIUM 7.4* 7.5* 7.4*  PHOS 6.2*  --  4.3   CBC: Recent Labs  Lab 02/05/23 1408 02/07/23 0207  WBC 23.7* 21.2*  NEUTROABS 20.0*  --   HGB 7.2* 6.5*  HCT 23.3* 20.9*  MCV 86.9 85.0  PLT 492* 405*   Blood Culture    Component Value Date/Time   SDES ABSCESS 11/27/2021 1148   SPECREQUEST  11/27/2021 1148    POST CT GUIDED LEFT RETROPERITONEAL HEMATOMA ASPIRATION   CULT  11/27/2021 1148    No growth aerobically or anaerobically. Performed at [facility], [address], [place], [place] [address]    REPTSTATUS 12/02/2021 FINAL 11/27/2021 1148     Physical Exam General: Lying in bed, nad  Heart: Regular rate, no rub Lungs: Bilateral chest rise, normal wob  Abdomen: obese, soft GU: Foley in place w/ tea colored urine  Extremities: No sig edema; L BKA erythema/ulceration surrounding incision  Dialysis Access: AVF +bruit   Medications:  heparin 1,800 Units/hr (02/07/23 0817)   piperacillin-tazobactam (ZOSYN)  IV 2.25 g (02/07/23 0752)    Chlorhexidine Gluconate Cloth  6 each Topical Q0600   darbepoetin (ARANESP) injection - DIALYSIS  200 mcg Subcutaneous Q Fri-1800   insulin aspart  0-9 Units Subcutaneous TID WC   insulin  glargine-yfgn  10 Units Subcutaneous BID   midodrine  5 mg Oral Daily   torsemide  40 mg Oral Daily   vancomycin variable dose per unstable renal function (pharmacist dosing)   Does not apply See admin instructions    Dialysis Orders:  [facility] MF 3:30 400/600 EDW 113.6 kg 3K/2.5 Ca AVF No heparin Mircera 200 q 2 wks to start 8/16      Assessment/Plan: S/p L BKA 8/5. Now admitted with poor wound healing/concern for infection. On Vanc/Zosyn. Vascular surgery following  ESRD -  HD Mon/Fri. Continue on schedule. Next HD Monday. . Follow UOP.  Hypertension/volume  - BP/volume acceptable  Anemia  - Hgb trending down --1 unit prbc ordered today. Aranesp 200 q Friday.  Metabolic bone disease -  Corr Ca/Phos in range.  HFpEF - Continue torsemide 40 mg qd T2DM - Insulin per primary  Afib - Eliquis on hold. On amiodarone/metoprolol   [name] Blase PA-C [facility] 02/07/2023,11:28 AM

## 2023-02-07 NOTE — Progress Notes (Signed)
[facility] PROGRESS NOTE  [name] [name] (DOB: [birth year]) RUE:[id number] PCP: [name], PA-C  Brief Narrative: [name] [name] is a 67 y.o. male with a history of PAD, T2DM, HTN, HLD, AFib, CAD s/p CABG, OSA, ESRD on HD M&F, CHF, and left BKA on 8/5, discharged home with wound vac on 8/8 who presented to the ED on 02/05/2023 with redness, and painful swelling at the stump site. He was admitted, placed on IV antibiotics, for stump site infection with vascular surgery consulting, hoping to avoid surgery at this time.   Subjective: Having nausea and vomiting he thinks is due to pain medicines, which he is taking more of due to stump site pain. Having loose stools, not completely liquid, about 2 per day. Requests to have assistance to commode.  Objective: BP (!) 124/58   Pulse 90   Temp 98.7 F (37.1 C) (Oral)   Resp 18   Ht 5\' 10"  (1.778 m)   Wt 112.3 kg   SpO2 100%   BMI 35.52 kg/m   No distress Clear, nonlabored RRR, no pitting RLE edema Abd is soft, nondistended, nontender, normal bowel sounds L stump site relatively unchanged from yesterday. Still anterior flap erythema, induration without fluctuance, lateral eschar. Posterior flap also with pitting edema.  Assessment & Plan: Principal Problem:   Amputation stump infection (HCC) Active Problems:   LV dysfunction   Paroxysmal atrial fibrillation (HCC)   Chronic anticoagulation   Benign essential HTN   Dysrhythmia   OSA (obstructive sleep apnea)   S/P CABG x 3   Coronary artery disease   End stage renal disease (HCC)   S/P BKA (below knee amputation), left (HCC)  PVD s/p BKA 8/5 with stump site infection:  - Continue vancomycin, zosyn, serial exams per vascular surgery. Holding DOAC.    Anemia of CKD:  - Hgb down in absence of vital sign changes, symptoms, or bleeding. Will give transfusion, monitor serially, ok to continue heparin. ESA per nephrology.   Nausea, vomiting, liquid stools:  - Probiotics,  antiemetics, taper opioids as able - Abd exam benign, fever curve reassuring. Will give imodium for antibiotic-associated diarrhea.   ESRD: s/p AKI during CABG admission, still makes urine. - Continue HD per routine Mon/Fri, appreciate assistance by nephrology. Continue midodrine.   CAD: s/p CABG, HLD:  - Continue metoprolol if BP will allow. Continue statin   Chronic HFpEF:  - Continue home demadex and dialysis, appears euvolemic, will need new EDW   PAF: Remains in NSR/SB - Continue amiodarone, metoprolol.  - Continue IV heparin. Holding eliquis pending clinical trajectory as he may need operation.   IDT2DM: Recent HbA1c 5.9%.  - Glargine 10u BID, SSI. May need to deescalate home regimen.    COPD:  - Continue controller BD, prn  OSA:  - CPAP qHS  Tyrone Nine, MD [facility] [email or web address] 02/07/2023, 8:10 AM

## 2023-02-07 NOTE — Progress Notes (Signed)
  Progress Note    02/07/2023 11:05 AM   Subjective: Feeling better  Vitals:   02/07/23 0828 02/07/23 1053  BP: (!) 116/50 (!) 122/57  Pulse: 93 87  Resp: 18   Temp: 98.5 F (36.9 C) 98.7 F (37.1 C)  SpO2: 97% 97%    Physical Exam:   CBC    Component Value Date/Time   WBC 21.2 (H) 02/07/2023 0207   RBC 2.46 (L) 02/07/2023 0207   HGB 6.5 (LL) 02/07/2023 0207   HGB 7.6 (L) 04/17/2022 1452   HCT 20.9 (L) 02/07/2023 0207   HCT 25.3 (L) 04/17/2022 1452   PLT 405 (H) 02/07/2023 0207   PLT 351 04/17/2022 1452   MCV 85.0 02/07/2023 0207   MCV 88 04/17/2022 1452   MCH 26.4 02/07/2023 0207   MCHC 31.1 02/07/2023 0207   RDW 20.5 (H) 02/07/2023 0207   RDW 15.5 (H) 04/17/2022 1452   LYMPHSABS 1.0 02/05/2023 1408   LYMPHSABS 1.4 02/18/2022 1117   MONOABS 2.0 (H) 02/05/2023 1408   EOSABS 0.2 02/05/2023 1408   EOSABS 0.3 02/18/2022 1117   BASOSABS 0.1 02/05/2023 1408   BASOSABS 0.1 02/18/2022 1117    BMET    Component Value Date/Time   NA 134 (L) 02/07/2023 0207   NA 139 04/17/2022 1452   K 3.5 02/07/2023 0207   CL 96 (L) 02/07/2023 0207   CO2 26 02/07/2023 0207   GLUCOSE 126 (H) 02/07/2023 0207   BUN 43 (H) 02/07/2023 0207   BUN 42 (H) 04/17/2022 1452   CREATININE 4.28 (H) 02/07/2023 0207   CALCIUM 7.4 (L) 02/07/2023 0207   GFRNONAA 14 (L) 02/07/2023 0207   GFRAA 42 (L) 10/18/2019 1006    INR    Component Value Date/Time   INR 1.8 (H) 01/26/2023 1513     Intake/Output Summary (Last 24 hours) at 02/07/2023 1105 Last data filed at 02/07/2023 0900 Gross per 24 hour  Intake 827.21 ml  Output 1184.9 ml  Net -357.69 ml     Assessment:  67 y.o. male is here with complication left below-knee amputation site  Plan: Continue antibiotics Concerning for underlying infection given significant leukocytosis although cellulitis improved I discussed with the patient that he is at high risk of requiring revision versus conversion to above-knee  amputation.   Edilberto Roosevelt C. Randie Heinz, MD [facility] Office: [phone] Pager: [phone]  02/07/2023 11:05 AM

## 2023-02-07 NOTE — Progress Notes (Addendum)
ANTICOAGULATION CONSULT NOTE - Follow-Up Consult  Pharmacy Consult for conversion from apixaban to heparin gtt Indication: atrial fibrillation  No Known Allergies  Patient Measurements: Height: 5\' 10"  (177.8 cm) Weight: 112.3 kg (247 lb 9.2 oz) IBW/kg (Calculated) : 73 Heparin Dosing Weight: 98.3 kg  Vital Signs: Temp: 98.5 F (36.9 C) (08/17 1445) Temp Source: Oral (08/17 1445) BP: 93/68 (08/17 1445) Pulse Rate: 82 (08/17 1445)  Labs: Recent Labs    02/05/23 1408 02/06/23 0733 02/06/23 2008 02/07/23 0207  HGB 7.2*  --   --  6.5*  HCT 23.3*  --   --  20.9*  PLT 492*  --   --  405*  APTT  --  27 95* 87*  HEPARINUNFRC  --  >1.10*  --  >1.10*  CREATININE 7.48* 7.02*  --  4.28*    Estimated Creatinine Clearance: 20.7 mL/min (A) (by C-G formula based on SCr of 4.28 mg/dL (H)).   Medical History: Past Medical History:  Diagnosis Date   Acute kidney failure, unspecified (HCC) 12/05/2021   Anemia 04/17/2022   Blood transfusion   Atherosclerotic heart disease    Benign essential HTN 07/30/2014   Bradycardia 01/25/2015   CHF (congestive heart failure) (HCC)    Chronic anticoagulation 02/25/2018   Chronic systolic CHF (congestive heart failure), NYHA class 3 (HCC) 08/30/2014   Overview:  Global ef 30%   CKD (chronic kidney disease)    Class 3 severe obesity in adult [facility]) 04/28/2017   Coronary artery disease    Diabetes mellitus without complication (HCC)    Diabetic neuropathy (HCC) 07/28/2014   Dysrhythmia    Erectile dysfunction    GERD (gastroesophageal reflux disease)    High risk medication use 05/26/2018   Hypertensive heart disease with heart failure (HCC) 07/30/2014   Leukocytosis 11/26/2021   Lumbago    LV dysfunction 04/28/2017   Mixed hyperlipidemia 07/28/2014   OSA (obstructive sleep apnea)    CPAP   Paroxysmal atrial fibrillation (HCC) 07/28/2014   Postop check 01/06/2022   S/P CABG x 3 11/12/2021   Sinus node dysfunction (HCC) 04/21/2016    Testicular hypofunction    Uncontrolled type 2 diabetes mellitus with microalbuminuric diabetic nephropathy 07/28/2014    Medications:  Facility-Administered Medications Prior to Admission  Medication Dose Route Frequency Provider Last Rate Last Admin   0.9 %  sodium chloride infusion  250 mL Intravenous PRN Maeola Harman, MD       0.9 %  sodium chloride infusion  250 mL Intravenous PRN [name], MD       sodium chloride flush (NS) 0.9 % injection 3 mL  3 mL Intravenous Q12H Maeola Harman, MD       sodium chloride flush (NS) 0.9 % injection 3 mL  3 mL Intravenous Q12H [name], MD       Medications Prior to Admission  Medication Sig Dispense Refill Last Dose   albuterol (VENTOLIN HFA) 108 (90 Base) MCG/ACT inhaler Inhale 2 puffs into the lungs every 6 (six) hours as needed for wheezing or shortness of breath.      amiodarone (PACERONE) 200 MG tablet Take 1 tablet (200 mg total) by mouth 2 (two) times daily. 180 tablet 3    atorvastatin (LIPITOR) 80 MG tablet Take 1 tablet (80 mg total) by mouth daily. 90 tablet 2    B Complex-C-Folic Acid (DIALYVITE 800) 0.8 MG TABS Take 0.8 mg by mouth daily.      clopidogrel (PLAVIX) 75 MG  tablet Take 1 tablet (75 mg total) by mouth daily. 30 tablet 11    Dulaglutide (TRULICITY) 3 MG/0.5ML SOPN Inject 3 mg into the skin every Wednesday.      ELIQUIS 5 MG TABS tablet TAKE 1 TABLET TWICE DAILY 180 tablet 3    fluticasone furoate-vilanterol (BREO ELLIPTA) 200-25 MCG/ACT AEPB Inhale 1 puff into the lungs daily.  3    insulin glargine (LANTUS) 100 UNIT/ML injection Inject 38 Units into the skin 2 (two) times daily.      Insulin Pen Needle 32G X 4 MM MISC Use to inject Levemir 2 (two) times daily. 100 each 1    Methoxy PEG-Epoetin Beta (MIRCERA IJ) Mircera      metoprolol succinate (TOPROL-XL) 25 MG 24 hr tablet Take 25 mg by mouth daily.      midodrine (PROAMATINE) 5 MG tablet Take 1 tablet (5 mg total) by mouth  daily. 90 tablet 3    oxyCODONE-acetaminophen (PERCOCET/ROXICET) 5-325 MG tablet Take 1 tablet by mouth every 6 (six) hours as needed for severe pain. 20 tablet 0    promethazine (PHENERGAN) 12.5 MG tablet Take 12.5 mg by mouth 3 (three) times daily as needed.      torsemide (DEMADEX) 20 MG tablet Take 1 tablet (20 mg total) by mouth daily. 90 tablet 1    Scheduled:   Chlorhexidine Gluconate Cloth  6 each Topical Q0600   darbepoetin (ARANESP) injection - DIALYSIS  200 mcg Subcutaneous Q Fri-1800   insulin aspart  0-9 Units Subcutaneous TID WC   insulin glargine-yfgn  10 Units Subcutaneous BID   [START ON 02/08/2023] midodrine  5 mg Oral Daily   torsemide  40 mg Oral Daily   vancomycin variable dose per unstable renal function (pharmacist dosing)   Does not apply See admin instructions    Assessment: 31 YOM with a recent LLE BKA admitted with increased swelling, redness, oozing and pain. He has a history of pAF and a CHA2DS2-VASc score of 4 on Eliquis PTA. Per patient report, he stated he last took a dose 8/14 PM- exact time unknown. Vascular surgery is holding on any surgical intervention to see how he responds to antibiotics but would like the Eliquis held in case the patient requires surgical intervention later this admission. Pharmacy has been consulted for heparin dosing.   8/17 PM: Hgb dropped this AM to 6.5 now s/p 1 unit RBC.  Heparin level and aPTT do not correlate yet due to recent DOAC. aPTT therapeutic at 87. Per RN, patient was bleeding from IV requiring IV team to pull line. No bleeding elsewhere. Now stable after line was pulled. Per RN, MD is aware.  Goal of Therapy:  Heparin level 0.3-0.7 units/ml aPTT 66-102 seconds Monitor platelets by anticoagulation protocol: Yes   Plan:  Continue heparin infusion at 1,800 units/hr  Check aPTT and heparin level daily until correlation Continue to monitor H&H and platelets In the event surgery is planned, the heparin infusion will need  to be held 6 hours prior to surgery.  Thank you for involving pharmacy in this patient's care.  Enos Fling, PharmD, BCPS  Clinical Pharmacist 02/07/2023 9:33 PM

## 2023-02-07 NOTE — Progress Notes (Signed)
Patient has morning hemoglobin 6.5.  On-call order to infuse one unit PRBC's.  Blood bank secured chat this morning at 0630 that the unit of blood is ready.  Day shift to follow up.

## 2023-02-07 NOTE — Progress Notes (Signed)
Attempted to give blood, patient IV leaking. IV team consulted to assess, IV team placed new IV but swelling around site. IV team ordered to hold off on giving blood until another IV team nurse confirmed okay for use. Awaiting confirmation.

## 2023-02-08 DIAGNOSIS — T8740 Infection of amputation stump, unspecified extremity: Secondary | ICD-10-CM | POA: Diagnosis not present

## 2023-02-08 LAB — RENAL FUNCTION PANEL
Albumin: 1.9 g/dL — ABNORMAL LOW (ref 3.5–5.0)
Anion gap: 14 (ref 5–15)
BUN: 56 mg/dL — ABNORMAL HIGH (ref 8–23)
CO2: 24 mmol/L (ref 22–32)
Calcium: 7.6 mg/dL — ABNORMAL LOW (ref 8.9–10.3)
Chloride: 96 mmol/L — ABNORMAL LOW (ref 98–111)
Creatinine, Ser: 5.38 mg/dL — ABNORMAL HIGH (ref 0.61–1.24)
GFR, Estimated: 11 mL/min — ABNORMAL LOW (ref >60)
Glucose, Bld: 112 mg/dL — ABNORMAL HIGH (ref 70–99)
Phosphorus: 6.7 mg/dL — ABNORMAL HIGH (ref 2.5–4.6)
Potassium: 3.5 mmol/L (ref 3.5–5.1)
Sodium: 134 mmol/L — ABNORMAL LOW (ref 135–145)

## 2023-02-08 LAB — TYPE AND SCREEN
ABO/RH(D): A POS
Antibody Screen: NEGATIVE
Unit division: 0

## 2023-02-08 LAB — GLUCOSE, CAPILLARY
Glucose-Capillary: 112 mg/dL — ABNORMAL HIGH (ref 70–99)
Glucose-Capillary: 114 mg/dL — ABNORMAL HIGH (ref 70–99)
Glucose-Capillary: 130 mg/dL — ABNORMAL HIGH (ref 70–99)
Glucose-Capillary: 98 mg/dL (ref 70–99)

## 2023-02-08 LAB — BPAM RBC
Blood Product Expiration Date: 202408232359
ISSUE DATE / TIME: 202408171102
Unit Type and Rh: 6200

## 2023-02-08 LAB — CBC
HCT: 24.2 % — ABNORMAL LOW (ref 39.0–52.0)
Hemoglobin: 7.7 g/dL — ABNORMAL LOW (ref 13.0–17.0)
MCH: 27.2 pg (ref 26.0–34.0)
MCHC: 31.8 g/dL (ref 30.0–36.0)
MCV: 85.5 fL (ref 80.0–100.0)
Platelets: 423 10*3/uL — ABNORMAL HIGH (ref 150–400)
RBC: 2.83 MIL/uL — ABNORMAL LOW (ref 4.22–5.81)
RDW: 19.8 % — ABNORMAL HIGH (ref 11.5–15.5)
WBC: 20.8 10*3/uL — ABNORMAL HIGH (ref 4.0–10.5)
nRBC: 0 % (ref 0.0–0.2)

## 2023-02-08 LAB — APTT: aPTT: 93 s — ABNORMAL HIGH (ref 24–36)

## 2023-02-08 LAB — HEPARIN LEVEL (UNFRACTIONATED): Heparin Unfractionated: >1.1 [IU]/mL — ABNORMAL HIGH (ref 0.30–0.70)

## 2023-02-08 MED ORDER — PROSOURCE PLUS PO LIQD
30.0000 mL | Freq: Two times a day (BID) | ORAL | Status: DC
  Administered 2023-02-09: 30 mL via ORAL
  Filled 2023-02-08 (×3): qty 30

## 2023-02-08 NOTE — Progress Notes (Signed)
[facility] PROGRESS NOTE  [name] [name] (DOB: 19-Aug-1955) OZD:[id number] PCP: [name], PA-C  Brief Narrative: [name] [name] is a 67 y.o. male with a history of PAD, T2DM, HTN, HLD, AFib, CAD s/p CABG, OSA, ESRD on HD M&F, CHF, and left BKA on 8/5, discharged home with wound vac on 8/8 who presented to the ED on 02/05/2023 with redness, and painful swelling at the stump site. He was admitted, placed on IV antibiotics, for stump site infection with vascular surgery consulting, hoping to avoid surgery at this time.   Subjective: Pain in leg decreased, feels it's getting better. Having liquid stools. Requires assistance to get up and hasn't gotten OOB. Foley in.   Objective: BP (!) 113/49 (BP Location: Left Arm)   Pulse 82   Temp 98.6 F (37 C)   Resp 18   Ht 5\' 10"  (1.778 m)   Wt 115.2 kg   SpO2 99%   BMI 36.44 kg/m   Gen: Chronically ill-appearing Pulm: Clear, nonlabored  CV: RRR GI: Soft, NT, ND, +BS, no fluid wave or tenderness at all.  Neuro: Alert and oriented. No new focal deficits. Skin: L BKA Anterior flap erythema, induration without fluctuance is stable, not changed appreciably. Posterior flap starting to look pink. Anterior lateral ecchymosis/eschar stable, mild bleeding from lateral wound edge. No purulence.  Assessment & Plan: Principal Problem:   Amputation stump infection (HCC) Active Problems:   LV dysfunction   Paroxysmal atrial fibrillation (HCC)   Chronic anticoagulation   Benign essential HTN   Dysrhythmia   OSA (obstructive sleep apnea)   S/P CABG x 3   Coronary artery disease   End stage renal disease (HCC)   S/P BKA (below knee amputation), left (HCC)  PVD s/p BKA 8/5 with stump site infection:  - Continue vancomycin, zosyn.  - Vascular surgery following.   - Holding DOAC.    Anemia of CKD:  - Hgb down, got transfusion, hgb up as anticipated to 7.7g/dl, continue monitoring IV site bleeding. ESA per nephrology.   Nausea, vomiting,  liquid stools:  - Probiotics, antiemetics, taper opioids as able - Abd exam benign, fever curve reassuring. Ordered prn imodium for antibiotic-associated diarrhea. None given yet.   ESRD: s/p AKI during CABG admission, still makes urine. - Continue HD per routine Mon/Fri, appreciate assistance by nephrology. Continue midodrine.   CAD: s/p CABG, HLD:  - Continue metoprolol if BP will allow. Continue statin   Chronic HFpEF:  - Continue home demadex and dialysis, appears euvolemic, will need new EDW   PAF: Remains in NSR/SB - Continue amiodarone, metoprolol.  - Continue IV heparin. Holding eliquis pending clinical trajectory as he may need operation.   Acute urinary retention: Occurred at [facility] prior to admission here.  - DC foley, voiding trial.   IDT2DM: Recent HbA1c 5.9%.  - Glargine 10u BID, SSI. May need to deescalate home regimen.    COPD:  - Continue controller BD, prn  OSA:  - CPAP qHS  Tyrone Nine, MD [facility] [email or web address] 02/08/2023, 11:49 AM

## 2023-02-08 NOTE — Progress Notes (Signed)
ANTICOAGULATION CONSULT NOTE - Follow-Up Consult  Pharmacy Consult for conversion from apixaban to heparin gtt Indication: atrial fibrillation  No Known Allergies  Patient Measurements: Height: 5\' 10"  (177.8 cm) Weight: 115.2 kg (253 lb 15.5 oz) IBW/kg (Calculated) : 73 Heparin Dosing Weight: 98.3 kg  Vital Signs: Temp: 98.5 F (36.9 C) (08/18 0559) Temp Source: Oral (08/18 0559) BP: 123/51 (08/18 0559) Pulse Rate: 85 (08/18 0559)  Labs: Recent Labs    02/05/23 1408 02/05/23 1408 02/06/23 0733 02/06/23 2008 02/07/23 0207 02/08/23 0428  HGB 7.2*  --   --   --  6.5* 7.7*  HCT 23.3*  --   --   --  20.9* 24.2*  PLT 492*  --   --   --  405* 423*  APTT  --    < > 27 95* 87* 93*  HEPARINUNFRC  --   --  >1.10*  --  >1.10* >1.10*  CREATININE 7.48*  --  7.02*  --  4.28* 5.38*   < > = values in this interval not displayed.    Estimated Creatinine Clearance: 16.7 mL/min (A) (by C-G formula based on SCr of 5.38 mg/dL (H)).   Medical History: Past Medical History:  Diagnosis Date   Acute kidney failure, unspecified (HCC) 12/05/2021   Anemia 04/17/2022   Blood transfusion   Atherosclerotic heart disease    Benign essential HTN 07/30/2014   Bradycardia 01/25/2015   CHF (congestive heart failure) (HCC)    Chronic anticoagulation 02/25/2018   Chronic systolic CHF (congestive heart failure), NYHA class 3 (HCC) 08/30/2014   Overview:  Global ef 30%   CKD (chronic kidney disease)    Class 3 severe obesity in adult [facility]) 04/28/2017   Coronary artery disease    Diabetes mellitus without complication (HCC)    Diabetic neuropathy (HCC) 07/28/2014   Dysrhythmia    Erectile dysfunction    GERD (gastroesophageal reflux disease)    High risk medication use 05/26/2018   Hypertensive heart disease with heart failure (HCC) 07/30/2014   Leukocytosis 11/26/2021   Lumbago    LV dysfunction 04/28/2017   Mixed hyperlipidemia 07/28/2014   OSA (obstructive sleep apnea)    CPAP    Paroxysmal atrial fibrillation (HCC) 07/28/2014   Postop check 01/06/2022   S/P CABG x 3 11/12/2021   Sinus node dysfunction (HCC) 04/21/2016   Testicular hypofunction    Uncontrolled type 2 diabetes mellitus with microalbuminuric diabetic nephropathy 07/28/2014    Medications:  Facility-Administered Medications Prior to Admission  Medication Dose Route Frequency Provider Last Rate Last Admin   0.9 %  sodium chloride infusion  250 mL Intravenous PRN Maeola Harman, MD       0.9 %  sodium chloride infusion  250 mL Intravenous PRN [name], MD       sodium chloride flush (NS) 0.9 % injection 3 mL  3 mL Intravenous Q12H Maeola Harman, MD       sodium chloride flush (NS) 0.9 % injection 3 mL  3 mL Intravenous Q12H [name], MD       Medications Prior to Admission  Medication Sig Dispense Refill Last Dose   albuterol (VENTOLIN HFA) 108 (90 Base) MCG/ACT inhaler Inhale 2 puffs into the lungs every 6 (six) hours as needed for wheezing or shortness of breath.      amiodarone (PACERONE) 200 MG tablet Take 1 tablet (200 mg total) by mouth 2 (two) times daily. 180 tablet 3    atorvastatin (LIPITOR) 80  MG tablet Take 1 tablet (80 mg total) by mouth daily. 90 tablet 2    B Complex-C-Folic Acid (DIALYVITE 800) 0.8 MG TABS Take 0.8 mg by mouth daily.      clopidogrel (PLAVIX) 75 MG tablet Take 1 tablet (75 mg total) by mouth daily. 30 tablet 11    Dulaglutide (TRULICITY) 3 MG/0.5ML SOPN Inject 3 mg into the skin every Wednesday.      ELIQUIS 5 MG TABS tablet TAKE 1 TABLET TWICE DAILY 180 tablet 3    fluticasone furoate-vilanterol (BREO ELLIPTA) 200-25 MCG/ACT AEPB Inhale 1 puff into the lungs daily.  3    insulin glargine (LANTUS) 100 UNIT/ML injection Inject 38 Units into the skin 2 (two) times daily.      Insulin Pen Needle 32G X 4 MM MISC Use to inject Levemir 2 (two) times daily. 100 each 1    Methoxy PEG-Epoetin Beta (MIRCERA IJ) Mircera       metoprolol succinate (TOPROL-XL) 25 MG 24 hr tablet Take 25 mg by mouth daily.      midodrine (PROAMATINE) 5 MG tablet Take 1 tablet (5 mg total) by mouth daily. 90 tablet 3    oxyCODONE-acetaminophen (PERCOCET/ROXICET) 5-325 MG tablet Take 1 tablet by mouth every 6 (six) hours as needed for severe pain. 20 tablet 0    promethazine (PHENERGAN) 12.5 MG tablet Take 12.5 mg by mouth 3 (three) times daily as needed.      torsemide (DEMADEX) 20 MG tablet Take 1 tablet (20 mg total) by mouth daily. 90 tablet 1    Scheduled:   Chlorhexidine Gluconate Cloth  6 each Topical Q0600   darbepoetin (ARANESP) injection - DIALYSIS  200 mcg Subcutaneous Q Fri-1800   insulin aspart  0-9 Units Subcutaneous TID WC   insulin glargine-yfgn  10 Units Subcutaneous BID   midodrine  5 mg Oral Daily   torsemide  40 mg Oral Daily   vancomycin variable dose per unstable renal function (pharmacist dosing)   Does not apply See admin instructions    Assessment: 58 YOM with a recent LLE BKA admitted with increased swelling, redness, oozing and pain. He has a history of pAF and a CHA2DS2-VASc score of 4 on Eliquis PTA. Per patient report, he stated he last took a dose 8/14 PM- exact time unknown. Vascular surgery is holding on any surgical intervention to see how he responds to antibiotics but would like the Eliquis held in case the patient requires surgical intervention later this admission. Pharmacy has been consulted for heparin dosing.   8/17 PM: Hgb dropped this AM to 6.5 now s/p 1 unit RBC.  Heparin level and aPTT do not correlate yet due to recent DOAC. aPTT therapeutic at 87. Per RN, patient was bleeding from IV requiring IV team to pull line. No bleeding elsewhere. Now stable after line was pulled. Per RN, MD is aware.  8/18 AM: Heparin level remains elevated at >1.10 due to recent DOAC use but aPTT is within goal range at 93 seconds. Hgb low but stable near 7, platelets remain elevated in the 400's.   Goal of  Therapy:  Heparin level 0.3-0.7 units/ml aPTT 66-102 seconds Monitor platelets by anticoagulation protocol: Yes   Plan:  Continue heparin infusion at 1,800 units/hr  Check aPTT and heparin level daily until correlation Continue to monitor H&H and platelets In the event surgery is planned, the heparin infusion will need to be held 6 hours prior to surgery.  Thank you for involving pharmacy in this  patient's care.  Blane Ohara, PharmD  PGY2 Pharmacy Resident

## 2023-02-08 NOTE — Progress Notes (Signed)
Noted order to discontinue foley. Patient has refused foley catheter removal despite providing education regarding foley use and removal criteria. MD informed via secure chat. Patient has also refused mobility.

## 2023-02-08 NOTE — Progress Notes (Signed)
  Progress Note    02/08/2023 2:44 PM * No surgery found *  Subjective: Sleeping  Vitals:   02/08/23 0837 02/08/23 1242  BP: (!) 113/49 (!) 123/59  Pulse: 82 88  Resp: 18 18  Temp: 98.6 F (37 C) 98 F (36.7 C)  SpO2: 99% 98%    Physical Exam: Erythema has improved but eschar in the central aspect of the left BKA slightly larger today  CBC    Component Value Date/Time   WBC 20.8 (H) 02/08/2023 0428   RBC 2.83 (L) 02/08/2023 0428   HGB 7.7 (L) 02/08/2023 0428   HGB 7.6 (L) 04/17/2022 1452   HCT 24.2 (L) 02/08/2023 0428   HCT 25.3 (L) 04/17/2022 1452   PLT 423 (H) 02/08/2023 0428   PLT 351 04/17/2022 1452   MCV 85.5 02/08/2023 0428   MCV 88 04/17/2022 1452   MCH 27.2 02/08/2023 0428   MCHC 31.8 02/08/2023 0428   RDW 19.8 (H) 02/08/2023 0428   RDW 15.5 (H) 04/17/2022 1452   LYMPHSABS 1.0 02/05/2023 1408   LYMPHSABS 1.4 02/18/2022 1117   MONOABS 2.0 (H) 02/05/2023 1408   EOSABS 0.2 02/05/2023 1408   EOSABS 0.3 02/18/2022 1117   BASOSABS 0.1 02/05/2023 1408   BASOSABS 0.1 02/18/2022 1117    BMET    Component Value Date/Time   NA 134 (L) 02/08/2023 0428   NA 139 04/17/2022 1452   K 3.5 02/08/2023 0428   CL 96 (L) 02/08/2023 0428   CO2 24 02/08/2023 0428   GLUCOSE 112 (H) 02/08/2023 0428   BUN 56 (H) 02/08/2023 0428   BUN 42 (H) 04/17/2022 1452   CREATININE 5.38 (H) 02/08/2023 0428   CALCIUM 7.6 (L) 02/08/2023 0428   GFRNONAA 11 (L) 02/08/2023 0428   GFRAA 42 (L) 10/18/2019 1006    INR    Component Value Date/Time   INR 1.8 (H) 01/26/2023 1513     Intake/Output Summary (Last 24 hours) at 02/08/2023 1444 Last data filed at 02/08/2023 1337 Gross per 24 hour  Intake 899.86 ml  Output 1350 ml  Net -450.14 ml     Assessment:  67 y.o. male is here with cellulitis and what appears to be central necrosis of his below-knee amputation on the left  Plan: Patient remains high risk for conversion to above-knee amputation   Aliz Meritt C. Randie Heinz,  MD [facility] Office: [phone] Pager: [phone]  02/08/2023 2:44 PM

## 2023-02-08 NOTE — Progress Notes (Signed)
  [facility] Progress Note   Subjective: Seen in room. No new complaints this am. Denies chest pain/dyspnea.    Objective Vitals:   02/07/23 2102 02/07/23 2229 02/08/23 0559 02/08/23 0837  BP:  (!) 129/56 (!) 123/51 (!) 113/49  Pulse:  86 85 82  Resp: 17 18 16 18   Temp:  98.3 F (36.8 C) 98.5 F (36.9 C) 98.6 F (37 C)  TempSrc:   Oral   SpO2:  97% 97% 99%  Weight:   115.2 kg   Height:          Additional Objective Labs: Basic Metabolic Panel: Recent Labs  Lab 02/05/23 1408 02/06/23 0733 02/07/23 0207 02/08/23 0428  NA 137 136 134* 134*  K 3.8 3.8 3.5 3.5  CL 101 103 96* 96*  CO2 20* 21* 26 24  GLUCOSE 78 82 126* 112*  BUN 90* 87* 43* 56*  CREATININE 7.48* 7.02* 4.28* 5.38*  CALCIUM 7.4* 7.5* 7.4* 7.6*  PHOS 6.2*  --  4.3 6.7*   CBC: Recent Labs  Lab 02/05/23 1408 02/07/23 0207 02/08/23 0428  WBC 23.7* 21.2* 20.8*  NEUTROABS 20.0*  --   --   HGB 7.2* 6.5* 7.7*  HCT 23.3* 20.9* 24.2*  MCV 86.9 85.0 85.5  PLT 492* 405* 423*   Blood Culture    Component Value Date/Time   SDES ABSCESS 11/27/2021 1148   SPECREQUEST  11/27/2021 1148    POST CT GUIDED LEFT RETROPERITONEAL HEMATOMA ASPIRATION   CULT  11/27/2021 1148    No growth aerobically or anaerobically. Performed at [facility], [address], [place], [place] [address]    REPTSTATUS 12/02/2021 FINAL 11/27/2021 1148     Physical Exam General: Lying in bed, nad  Heart: Regular rate, no rub Lungs: Bilateral chest rise, normal wob  Abdomen: obese, soft GU: Foley in place w/ tea colored urine  Extremities: No sig edema; L BKA erythema/ulceration surrounding incision  Dialysis Access: AVF +bruit   Medications:  heparin 1,800 Units/hr (02/07/23 2046)   piperacillin-tazobactam (ZOSYN)  IV 2.25 g (02/08/23 0538)   [START ON 02/09/2023] vancomycin      Chlorhexidine Gluconate Cloth  6 each Topical Q0600   darbepoetin (ARANESP) injection - DIALYSIS  200 mcg Subcutaneous Q  Fri-1800   insulin aspart  0-9 Units Subcutaneous TID WC   insulin glargine-yfgn  10 Units Subcutaneous BID   midodrine  5 mg Oral Daily   torsemide  40 mg Oral Daily   vancomycin variable dose per unstable renal function (pharmacist dosing)   Does not apply See admin instructions    Dialysis Orders:  [facility] MF 3:30 400/600 EDW 113.6 kg 3K/2.5 Ca AVF No heparin Mircera 200 q 2 wks to start 8/16      Assessment/Plan: S/p L BKA 8/5. Now admitted with poor wound healing/concern for infection. On Vanc/Zosyn. Vascular surgery following  ESRD -  HD Mon/Fri. Continue on schedule. Next HD Monday.  H/o urinary retention. Foley in place.  Hypertension/volume  - BP/volume acceptable  Anemia  - Hgb 7.7 s/p 1 u prbcs 8/17.   Aranesp 200 q Friday.  Metabolic bone disease -  Corr Ca/Phos in range.  HFpEF - Continue torsemide 40 mg qd T2DM - Insulin per primary  Afib - Eliquis on hold. On amiodarone/metoprolol  Nutrition - Renal diet/fluid restriction. Add prot supp  [name] Blase PA-C [facility] 02/08/2023,12:12 PM

## 2023-02-08 NOTE — Progress Notes (Signed)
PHARMACY ANTIBIOTIC CONSULT NOTE   [name] [name] is a 67 y.o. male who had a L BKA performed 8/5 by [facility].  Pharmacy has been consulted for vancomycin and zosyn dosing for poor healing of the surgical wound with concern for infection. Patient is ESRD with HD 2x/week, Mondays and Fridays.   Patient will receive dialysis again on Monday, 8/19. His last dialysis session was Friday, 8/16. He received vancomycin 1000mg  IV after his 8/16 dialysis session. Next Vancomycin dose is scheduled for Monday, 8/19 after his dialysis session.   His WBC has trended down slightly but remains elevated at 20.8k. He is afebrile. Per MD, appearance of cellulitis is improved but leukocytosis remains concerning. Per [facility] on 8/17, they are considering site revision vs conversion to AKA. MD notes to continue antibiotics.   Plan: Continue Vancomycin 1,000 mg IV QHD M&F  Continue Piperacillin- tazobactam 2.25 g IV Q8H  F/U HD schedule/tolerability, clinical status, de-escalation, C/S, levels as indicated    Allergies:  No Known Allergies  Filed Weights   02/06/23 1351 02/06/23 1802 02/08/23 0559  Weight: 114.5 kg (252 lb 6.8 oz) 112.3 kg (247 lb 9.2 oz) 115.2 kg (253 lb 15.5 oz)       Latest Ref Rng & Units 02/08/2023    4:28 AM 02/07/2023    2:07 AM 02/05/2023    2:08 PM  CBC  WBC 4.0 - 10.5 K/uL 20.8  21.2  23.7   Hemoglobin 13.0 - 17.0 g/dL 7.7  6.5  7.2   Hematocrit 39.0 - 52.0 % 24.2  20.9  23.3   Platelets 150 - 400 K/uL 423  405  492     Antibiotics Given (last 72 hours)     Date/Time Action Medication Dose Rate   02/05/23 1727 New Bag/Given   vancomycin (VANCOREADY) IVPB 2000 mg/400 mL 2,000 mg 200 mL/hr   02/05/23 1940 New Bag/Given   piperacillin-tazobactam (ZOSYN) IVPB 2.25 g 2.25 g 100 mL/hr   02/06/23 0203 New Bag/Given   piperacillin-tazobactam (ZOSYN) IVPB 2.25 g 2.25 g 100 mL/hr   02/06/23 0806 New Bag/Given   piperacillin-tazobactam (ZOSYN) IVPB 2.25 g 2.25 g 100 mL/hr   02/06/23 1647 New  Bag/Given   vancomycin (VANCOCIN) IVPB 1000 mg/200 mL premix 1,000 mg 200 mL/hr   02/06/23 2323 New Bag/Given   piperacillin-tazobactam (ZOSYN) IVPB 2.25 g 2.25 g 100 mL/hr   02/07/23 7829 New Bag/Given   piperacillin-tazobactam (ZOSYN) IVPB 2.25 g 2.25 g 100 mL/hr   02/07/23 1635 New Bag/Given   piperacillin-tazobactam (ZOSYN) IVPB 2.25 g 2.25 g 100 mL/hr   02/07/23 2333 New Bag/Given   piperacillin-tazobactam (ZOSYN) IVPB 2.25 g 2.25 g 100 mL/hr   02/08/23 0538 New Bag/Given   piperacillin-tazobactam (ZOSYN) IVPB 2.25 g 2.25 g 100 mL/hr       Antimicrobials this admission: Vanc 8/15>> Zosyn 8/15>>  Microbiology results: none  Thank you for allowing pharmacy to be a part of this patient's care.  Blane Ohara, PharmD  PGY2 Pharmacy Resident

## 2023-02-08 NOTE — Plan of Care (Signed)
  Problem: Health Behavior/Discharge Planning: Goal: Ability to manage health-related needs will improve Outcome: Not Progressing  Patient refused foley catheter to be removed, refused mobility.

## 2023-02-09 DIAGNOSIS — T8740 Infection of amputation stump, unspecified extremity: Secondary | ICD-10-CM | POA: Diagnosis not present

## 2023-02-09 LAB — CBC
HCT: 24 % — ABNORMAL LOW (ref 39.0–52.0)
Hemoglobin: 7.4 g/dL — ABNORMAL LOW (ref 13.0–17.0)
MCH: 26.3 pg (ref 26.0–34.0)
MCHC: 30.8 g/dL (ref 30.0–36.0)
MCV: 85.4 fL (ref 80.0–100.0)
Platelets: 422 10*3/uL — ABNORMAL HIGH (ref 150–400)
RBC: 2.81 MIL/uL — ABNORMAL LOW (ref 4.22–5.81)
RDW: 19.6 % — ABNORMAL HIGH (ref 11.5–15.5)
WBC: 24.2 10*3/uL — ABNORMAL HIGH (ref 4.0–10.5)
nRBC: 0 % (ref 0.0–0.2)

## 2023-02-09 LAB — GLUCOSE, CAPILLARY
Glucose-Capillary: 119 mg/dL — ABNORMAL HIGH (ref 70–99)
Glucose-Capillary: 105 mg/dL — ABNORMAL HIGH (ref 70–99)
Glucose-Capillary: 184 mg/dL — ABNORMAL HIGH (ref 70–99)

## 2023-02-09 LAB — RENAL FUNCTION PANEL
Albumin: 1.8 g/dL — ABNORMAL LOW (ref 3.5–5.0)
Anion gap: 15 (ref 5–15)
BUN: 68 mg/dL — ABNORMAL HIGH (ref 8–23)
CO2: 21 mmol/L — ABNORMAL LOW (ref 22–32)
Calcium: 7.4 mg/dL — ABNORMAL LOW (ref 8.9–10.3)
Chloride: 96 mmol/L — ABNORMAL LOW (ref 98–111)
Creatinine, Ser: 6.22 mg/dL — ABNORMAL HIGH (ref 0.61–1.24)
GFR, Estimated: 9 mL/min — ABNORMAL LOW (ref >60)
Glucose, Bld: 118 mg/dL — ABNORMAL HIGH (ref 70–99)
Phosphorus: 8.1 mg/dL — ABNORMAL HIGH (ref 2.5–4.6)
Potassium: 3.1 mmol/L — ABNORMAL LOW (ref 3.5–5.1)
Sodium: 132 mmol/L — ABNORMAL LOW (ref 135–145)

## 2023-02-09 LAB — HEPARIN LEVEL (UNFRACTIONATED): Heparin Unfractionated: >1.1 [IU]/mL — ABNORMAL HIGH (ref 0.30–0.70)

## 2023-02-09 LAB — APTT: aPTT: 74 seconds — ABNORMAL HIGH (ref 24–36)

## 2023-02-09 MED ORDER — LIDOCAINE-PRILOCAINE 2.5-2.5 % EX CREA: 1.0000 | TOPICAL_CREAM | CUTANEOUS | Status: DC | PRN

## 2023-02-09 MED ORDER — LIDOCAINE HCL (PF) 1 % IJ SOLN: 5.0000 mL | INTRAMUSCULAR | Status: DC | PRN

## 2023-02-09 MED ORDER — AMIODARONE HCL 200 MG PO TABS
200.0000 mg | ORAL_TABLET | Freq: Two times a day (BID) | ORAL | Status: DC
  Administered 2023-02-09 – 2023-02-12 (×6): 200 mg via ORAL
  Filled 2023-02-09 (×6): qty 1

## 2023-02-09 MED ORDER — PENTAFLUOROPROP-TETRAFLUOROETH EX AERO: 1.0000 | INHALATION_SPRAY | CUTANEOUS | Status: DC | PRN

## 2023-02-09 MED ORDER — HEPARIN SODIUM (PORCINE) 1000 UNIT/ML DIALYSIS: 1000.0000 [IU] | INTRAMUSCULAR | Status: DC | PRN

## 2023-02-09 MED ORDER — ATORVASTATIN CALCIUM 80 MG PO TABS
80.0000 mg | ORAL_TABLET | Freq: Every day | ORAL | Status: DC
  Administered 2023-02-09 – 2023-02-12 (×3): 80 mg via ORAL
  Filled 2023-02-09 (×3): qty 1

## 2023-02-09 MED ORDER — SEVELAMER CARBONATE 800 MG PO TABS
1600.0000 mg | ORAL_TABLET | Freq: Three times a day (TID) | ORAL | Status: DC
  Administered 2023-02-09 – 2023-02-12 (×4): 1600 mg via ORAL
  Filled 2023-02-09 (×6): qty 2

## 2023-02-09 MED ORDER — ANTICOAGULANT SODIUM CITRATE 4% (200MG/5ML) IV SOLN: 5.0000 mL | Status: DC | PRN

## 2023-02-09 MED ORDER — ALTEPLASE 2 MG IJ SOLR: 2.0000 mg | Freq: Once | INTRAMUSCULAR | Status: DC | PRN

## 2023-02-09 NOTE — Progress Notes (Signed)
Informed PA Eljigiri about 3.1 potassium and putting on 4K bath.  No further instructions from PA Eljigiri.  Stacie Glaze, LPN-KDU

## 2023-02-09 NOTE — Progress Notes (Signed)
PT Cancellation Note  Patient Details Name: Trevonta Brisky MRN: [id number] DOB: Apr 02, 1956   Cancelled Treatment:    Reason Eval/Treat Not Completed: Patient at procedure or test/unavailable  Currently in HD;  Will follow up later today as time allows;  Otherwise, will follow up for PT tomorrow;   Thank you,  Van Clines, PT  Acute Rehabilitation Services Office [phone]    Levi Aland 02/09/2023, 9:24 AM

## 2023-02-09 NOTE — Progress Notes (Signed)
OT Cancellation Note  Patient Details Name: [name] [name] MRN: [id number] DOB: October 20, 1955   Cancelled Treatment:    Reason Eval/Treat Not Completed: Medical issues which prohibited therapy;Patient at procedure or test/ unavailable Patient is off the floor at HD, OT will follow back to complete evaluation as time permits.   Pollyann Glen E. Becket Wecker, OTR/L Acute Rehabilitation Services [phone]   Cherlyn Cushing 02/09/2023, 9:42 AM

## 2023-02-09 NOTE — Progress Notes (Addendum)
  Progress Note    02/09/2023 8:11 AM * No surgery found *  Subjective:  less pain in stump; wants to go home   Vitals:   02/09/23 0423 02/09/23 0754  BP: (!) 127/52 (!) 113/49  Pulse: 84 91  Resp: 17 18  Temp: 98.5 F (36.9 C) 98.3 F (36.8 C)  SpO2: 99% 97%   Physical Exam: Lungs:  non labored Incisions:  L BKA pictured below Abdomen:  soft Neurologic: A&O    CBC    Component Value Date/Time   WBC 24.2 (H) 02/09/2023 0327   RBC 2.81 (L) 02/09/2023 0327   HGB 7.4 (L) 02/09/2023 0327   HGB 7.6 (L) 04/17/2022 1452   HCT 24.0 (L) 02/09/2023 0327   HCT 25.3 (L) 04/17/2022 1452   PLT 422 (H) 02/09/2023 0327   PLT 351 04/17/2022 1452   MCV 85.4 02/09/2023 0327   MCV 88 04/17/2022 1452   MCH 26.3 02/09/2023 0327   MCHC 30.8 02/09/2023 0327   RDW 19.6 (H) 02/09/2023 0327   RDW 15.5 (H) 04/17/2022 1452   LYMPHSABS 1.0 02/05/2023 1408   LYMPHSABS 1.4 02/18/2022 1117   MONOABS 2.0 (H) 02/05/2023 1408   EOSABS 0.2 02/05/2023 1408   EOSABS 0.3 02/18/2022 1117   BASOSABS 0.1 02/05/2023 1408   BASOSABS 0.1 02/18/2022 1117    BMET    Component Value Date/Time   NA 132 (L) 02/09/2023 0327   NA 139 04/17/2022 1452   K 3.1 (L) 02/09/2023 0327   CL 96 (L) 02/09/2023 0327   CO2 21 (L) 02/09/2023 0327   GLUCOSE 118 (H) 02/09/2023 0327   BUN 68 (H) 02/09/2023 0327   BUN 42 (H) 04/17/2022 1452   CREATININE 6.22 (H) 02/09/2023 0327   CALCIUM 7.4 (L) 02/09/2023 0327   GFRNONAA 9 (L) 02/09/2023 0327   GFRAA 42 (L) 10/18/2019 1006    INR    Component Value Date/Time   INR 1.8 (H) 01/26/2023 1513     Intake/Output Summary (Last 24 hours) at 02/09/2023 0811 Last data filed at 02/09/2023 0539 Gross per 24 hour  Intake 1118.56 ml  Output 500 ml  Net 618.56 ml     Assessment/Plan:  67 y.o. male is s/p L BKA  Redness maybe slightly improved White count remains elevated Continue Vanc and Zosyn for now Patient aware he may require conversion to AKA however he  would prefer antibiotics and conservative management   Emilie Rutter, PA-C [facility] [phone] 02/09/2023 8:11 AM  VASCULAR STAFF ADDENDUM: I have independently interviewed and examined the patient. I agree with the above.  Counseled patient that best option for saving his knee is BKA revision tomorrow which he is amenable to.  Plan excisional debridement of necrotic eschar and application of wound vac with planned delayed primary closure tomorrow in OR with one of my partners. Please keep NPO at midnight. Orders for consent written.  Rande Brunt. Lenell Antu, MD [facility] Phone Number: [phone] 02/09/2023 10:36 AM

## 2023-02-09 NOTE — Progress Notes (Signed)
Blood in urine noted, MD notified. Orders to d/c heparin. Heparin d/c per order.

## 2023-02-09 NOTE — Progress Notes (Addendum)
Patient refusing discontinuation of foley cath, and refuses mobility. Pt had been educated on the importance of mobility and risks. No evidence of understanding. Needs reinforcement.

## 2023-02-09 NOTE — Plan of Care (Signed)
  Problem: Clinical Measurements: Goal: Respiratory complications will improve Outcome: Progressing Goal: Cardiovascular complication will be avoided Outcome: Progressing   Problem: Nutrition: Goal: Adequate nutrition will be maintained Outcome: Progressing   Problem: Coping: Goal: Level of anxiety will decrease Outcome: Progressing   Problem: Safety: Goal: Ability to remain free from injury will improve Outcome: Progressing

## 2023-02-09 NOTE — Progress Notes (Signed)
[facility] Progress Note   Subjective:  Seen on HD - circuit clotted and being set up again at this time. 2.5L UFG. No CP/dyspnea. Upset that will be getting further surgery tomorrow - excisional debridement. Foley bag with hematuria present - tells me if has been like that.  Objective Vitals:   02/09/23 0930 02/09/23 1000 02/09/23 1030 02/09/23 1100  BP: (!) 120/53 (!) 113/55 (!) 106/54 (!) 106/53  Pulse: 88 91 93 99  Resp: 18 18 17 17   Temp:      TempSrc:      SpO2: 95% 98% 99% 96%  Weight:      Height:       Physical Exam General: Overweight man, NAD. Room air.  Heart: RRR; no murmur Lungs: CTA anteriorly Abdomen: obese, non-tender Extremities: Trace RLE edema; L BLA with erythema and necrotic edge along incision Dialysis Access: AVF + bruit  Additional Objective Labs: Basic Metabolic Panel: Recent Labs  Lab 02/07/23 0207 02/08/23 0428 02/09/23 0327  NA 134* 134* 132*  K 3.5 3.5 3.1*  CL 96* 96* 96*  CO2 26 24 21*  GLUCOSE 126* 112* 118*  BUN 43* 56* 68*  CREATININE 4.28* 5.38* 6.22*  CALCIUM 7.4* 7.6* 7.4*  PHOS 4.3 6.7* 8.1*   Liver Function Tests: Recent Labs  Lab 02/05/23 1408 02/06/23 0733 02/07/23 0207 02/08/23 0428 02/09/23 0327  AST 326* 398*  --   --   --   ALT 119* 131*  --   --   --   ALKPHOS 98 82  --   --   --   BILITOT 1.0 1.2  --   --   --   PROT 6.3* 5.9*  --   --   --   ALBUMIN 2.2* 1.9* 1.8* 1.9* 1.8*   CBC: Recent Labs  Lab 02/05/23 1408 02/07/23 0207 02/08/23 0428 02/09/23 0327  WBC 23.7* 21.2* 20.8* 24.2*  NEUTROABS 20.0*  --   --   --   HGB 7.2* 6.5* 7.7* 7.4*  HCT 23.3* 20.9* 24.2* 24.0*  MCV 86.9 85.0 85.5 85.4  PLT 492* 405* 423* 422*   Medications:  anticoagulant sodium citrate     piperacillin-tazobactam (ZOSYN)  IV 2.25 g (02/09/23 0539)   vancomycin      (feeding supplement) PROSource Plus  30 mL Oral BID BM   amiodarone  200 mg Oral BID   atorvastatin  80 mg Oral Daily   Chlorhexidine  Gluconate Cloth  6 each Topical Q0600   darbepoetin (ARANESP) injection - DIALYSIS  200 mcg Subcutaneous Q Fri-1800   insulin aspart  0-9 Units Subcutaneous TID WC   insulin glargine-yfgn  10 Units Subcutaneous BID   midodrine  5 mg Oral Daily   torsemide  40 mg Oral Daily   vancomycin variable dose per unstable renal function (pharmacist dosing)   Does not apply See admin instructions    Dialysis Orders: [facility] MF 3:30 400/600 EDW 113.6 kg 3K/2.5 Ca AVF No heparin Mircera 200 q 2 wks to start 8/16      Assessment/Plan: L BKA wound infection: S/p L BKA 8/5. Wound poorly healing, concern for infection and with necrotic appearing area. On Vanc/Zosyn. Vascular surgery following - for excisional debridement with wound vac placement tomorrow. ESRD: Only Monday/Friday HD as outpatient - historically with better BUN/Cr - given acute issues, would benefit from more HD - rec 3d/week HD - d/w him - he doesn't like this idea. HD today, next HD most likely on  Wednesday. Hx urinary retention. Foley in place; urine is bloody - per primary. Hypotension/volume: BP low side, on mido 5mg  daily - follow. Anemia of ESRD: Hgb 7.4 today. Aranesp q Friday, s/p 1U PRBCs on 8/17.   Metabolic bone disease: CorrCa ok, Phos high - doesn't take binders as OP - usually better, start Renvela 2/meals here. HFpEF - Continue torsemide 40 mg  daily T2DM - Insulin per primary  Afib - Eliquis on hold. On amiodarone/metoprolol. On heparin drip. Nutrition: Alb low, continue supplements.  Ozzie Hoyle, PA-C 02/09/2023, 11:27 AM  [employer]

## 2023-02-09 NOTE — Progress Notes (Signed)
Received patient in bed to unit.  Alert and oriented.  Informed consent signed and in chart.   TX duration:3.5  Patient tolerated well.  Transported back to the room  Alert, without acute distress.  Hand-off given to patient's nurse.   Access used: right AVF Access issues: none  Total UF removed: 1.7L Medication(s) given: vanc   02/09/23 1331  Vitals  Temp 97.9 F (36.6 C)  Temp Source Oral  BP (!) 103/39  MAP (mmHg) (!) 54  BP Location Left Arm  BP Method Automatic  Patient Position (if appropriate) Lying  Pulse Rate 84  Pulse Rate Source Monitor  ECG Heart Rate 97  Resp 18  Oxygen Therapy  SpO2 97 %  O2 Device Room Air  During Treatment Monitoring  Blood Flow Rate (mL/min) 399 mL/min  Arterial Pressure (mmHg) -188.88 mmHg  Venous Pressure (mmHg) 216.55 mmHg  TMP (mmHg) -3.03 mmHg  Ultrafiltration Rate (mL/min) 0 mL/min  Dialysate Flow Rate (mL/min) 300 ml/min  HD Safety Checks Performed Yes  Intra-Hemodialysis Comments Tx completed  Dialysis Fluid Bolus Normal Saline  Bolus Amount (mL) 300 mL  Duration of HD Treatment -hour(s) 3.47 hour(s)  Cumulative Fluid Removed (mL) per Treatment  1668.95      Sareen Randon S [facility]

## 2023-02-09 NOTE — Progress Notes (Signed)
PT Cancellation Note  Patient Details Name: [name] [name] MRN: [id number] DOB: 06/21/1956   Cancelled Treatment:    Reason Eval/Treat Not Completed: Patient declined, no reason specified  Fatigued after dialysis, requests follow-up tomorrow for PT evaluation (noted surgery scheduled for tomorrow.) Obtained updated history. Pt appears to have needed resources and equipment in place from recent admission to allow for d/c back home. Will follow-up for post-op assessment to ensure safe for D/c back home with HHPT. Do not anticipate any major set backs. He is eager to return home and continue with therapies.   Kathlyn Sacramento, PT, DPT [facility]  Rehabilitation Services Physical Therapist Office: [phone] Website: [email or web address]   Berton Mount 02/09/2023, 3:58 PM

## 2023-02-09 NOTE — Progress Notes (Signed)
[facility] PROGRESS NOTE  [name] [name] (DOB: 06-11-1956) WJX:[id number] PCP: [name], PA-C  Brief Narrative: [name] [name] is a 67 y.o. male with a history of PAD, T2DM, HTN, HLD, AFib, CAD s/p CABG, OSA, ESRD on HD M&F, CHF, and left BKA on 8/5, discharged home with wound vac on 8/8 who presented to the ED on 02/05/2023 with redness, and painful swelling at the stump site. He was admitted, placed on IV antibiotics, for stump site infection with vascular surgery consulting. Eliquis has been held in preparation for possible conversion to AKA. PT/OT consulted. Routine HD ongoing.   Subjective: Pain a bit better in the left this morning. Making jokes. Diarrhea better. Having less redness/darkness to his urine this morning.  Objective: BP (!) 127/52 (BP Location: Left Arm)   Pulse 84   Temp 98.5 F (36.9 C) (Oral)   Resp 17   Ht 5\' 10"  (1.778 m)   Wt 116.8 kg   SpO2 99%   BMI 36.95 kg/m   Gen: No distress, chronically ill-appearing Pulm: Clear, nonlabored  CV: Irreg irreg, rate in 70's, no MRG, trace RLE edema GI: Soft, NT, ND, +BS, protuberant. Neuro: Alert and oriented. No new focal deficits. Ext: Warm, dry. L BKA with continued erythema, induration predominantly anterior stump site with ecchymosis and dried blood at wound edge. Skin: Scattered upper extremity ecchymoses. No other/new rashes, lesions or ulcers on visualized skin   Assessment & Plan: Principal Problem:   Amputation stump infection (HCC) Active Problems:   LV dysfunction   Paroxysmal atrial fibrillation (HCC)   Chronic anticoagulation   Benign essential HTN   Dysrhythmia   OSA (obstructive sleep apnea)   S/P CABG x 3   Coronary artery disease   End stage renal disease (HCC)   S/P BKA (below knee amputation), left (HCC)  PVD s/p BKA 8/5 with stump site infection:  - Continue vancomycin, zosyn. Remains infected-appearing despite several days of IV antibiotics.  - Vascular surgery following. At  very high risk of AKA.  - Holding DOAC.    Anemia of CKD:  - Hgb down, got transfusion 8/17, hgb up as anticipated, continue monitoring. Stopping anticoagulation. ESA qFriday per nephrology.   Nausea, vomiting, liquid stools: Improving.  - Probiotics, antiemetics, taper opioids as able - Abd exam benign, fever curve reassuring. Ordered prn imodium for antibiotic-associated diarrhea   ESRD: s/p AKI during CABG admission, still makes urine. - Continue HD per routine Mon/Fri, appreciate assistance by nephrology. Continue midodrine.   CAD: s/p CABG, HLD:  - Continue metoprolol if BP will allow. Continue statin   Chronic HFpEF:  - Continue home demadex and dialysis, appears euvolemic, will need new EDW   PAF: Rate controlled. - Continue amiodarone. Holding metoprolol with soft BPs.  - Stop IV heparin with ongoing bleeding, supratherapeutic levels. Pt aware of risk of stroke and agrees with holding anticoagulation at this time. Holding eliquis pending clinical trajectory as he may need operation.   Acute urinary retention: Occurred at [facility] prior to admission here.  - Passing urine after foley removed. Some hematuria.   IDT2DM: Recent HbA1c 5.9%.  - Glargine 10u BID, SSI. May need to deescalate home regimen.    COPD:  - Continue controller BD, prn  OSA:  - CPAP qHS  Tyrone Nine, MD [facility] [email or web address] 02/09/2023, 7:48 AM

## 2023-02-10 ENCOUNTER — Inpatient Hospital Stay (HOSPITAL_COMMUNITY): Payer: Medicare HMO | Admitting: Anesthesiology

## 2023-02-10 ENCOUNTER — Encounter (HOSPITAL_COMMUNITY): Payer: Self-pay | Admitting: Internal Medicine

## 2023-02-10 ENCOUNTER — Other Ambulatory Visit: Payer: Self-pay

## 2023-02-10 ENCOUNTER — Inpatient Hospital Stay (HOSPITAL_COMMUNITY): Payer: Medicare HMO

## 2023-02-10 ENCOUNTER — Encounter (HOSPITAL_COMMUNITY)
Admission: AD | Disposition: A | Discharge status: Home Health | Payer: Self-pay | Source: Other Acute Inpatient Hospital | Attending: Family Medicine

## 2023-02-10 DIAGNOSIS — N186 End stage renal disease: Secondary | ICD-10-CM

## 2023-02-10 DIAGNOSIS — T8740 Infection of amputation stump, unspecified extremity: Secondary | ICD-10-CM | POA: Diagnosis not present

## 2023-02-10 DIAGNOSIS — Z992 Dependence on renal dialysis: Secondary | ICD-10-CM

## 2023-02-10 DIAGNOSIS — I5032 Chronic diastolic (congestive) heart failure: Secondary | ICD-10-CM

## 2023-02-10 DIAGNOSIS — T8781 Dehiscence of amputation stump: Secondary | ICD-10-CM

## 2023-02-10 DIAGNOSIS — T8744 Infection of amputation stump, left lower extremity: Secondary | ICD-10-CM

## 2023-02-10 DIAGNOSIS — I132 Hypertensive heart and chronic kidney disease with heart failure and with stage 5 chronic kidney disease, or end stage renal disease: Secondary | ICD-10-CM

## 2023-02-10 HISTORY — PX: STUMP REVISION: SHX6102

## 2023-02-10 HISTORY — PX: APPLICATION OF WOUND VAC: SHX5189

## 2023-02-10 LAB — CBC
HCT: 24.2 % — ABNORMAL LOW (ref 39.0–52.0)
Hemoglobin: 7.7 g/dL — ABNORMAL LOW (ref 13.0–17.0)
MCH: 27.4 pg (ref 26.0–34.0)
MCHC: 31.8 g/dL (ref 30.0–36.0)
MCV: 86.1 fL (ref 80.0–100.0)
Platelets: 448 10*3/uL — ABNORMAL HIGH (ref 150–400)
RBC: 2.81 MIL/uL — ABNORMAL LOW (ref 4.22–5.81)
RDW: 19.9 % — ABNORMAL HIGH (ref 11.5–15.5)
WBC: 25.3 10*3/uL — ABNORMAL HIGH (ref 4.0–10.5)
nRBC: 0 % (ref 0.0–0.2)

## 2023-02-10 LAB — SURGICAL PCR SCREEN
MRSA, PCR: NEGATIVE
Staphylococcus aureus: NEGATIVE

## 2023-02-10 LAB — POCT I-STAT, CHEM 8
BUN: 52 mg/dL — ABNORMAL HIGH (ref 8–23)
Calcium, Ion: 0.99 mmol/L — ABNORMAL LOW (ref 1.15–1.40)
Chloride: 94 mmol/L — ABNORMAL LOW (ref 98–111)
Creatinine, Ser: 5.7 mg/dL — ABNORMAL HIGH (ref 0.61–1.24)
Glucose, Bld: 130 mg/dL — ABNORMAL HIGH (ref 70–99)
HCT: 24 % — ABNORMAL LOW (ref 39.0–52.0)
Hemoglobin: 8.2 g/dL — ABNORMAL LOW (ref 13.0–17.0)
Potassium: 3.9 mmol/L (ref 3.5–5.1)
Sodium: 131 mmol/L — ABNORMAL LOW (ref 135–145)
TCO2: 26 mmol/L (ref 22–32)

## 2023-02-10 LAB — GLUCOSE, CAPILLARY
Glucose-Capillary: 131 mg/dL — ABNORMAL HIGH (ref 70–99)
Glucose-Capillary: 116 mg/dL — ABNORMAL HIGH (ref 70–99)
Glucose-Capillary: 104 mg/dL — ABNORMAL HIGH (ref 70–99)
Glucose-Capillary: 135 mg/dL — ABNORMAL HIGH (ref 70–99)
Glucose-Capillary: 123 mg/dL — ABNORMAL HIGH (ref 70–99)

## 2023-02-10 LAB — HEPATITIS B SURFACE ANTIBODY, QUANTITATIVE: Hep B S AB Quant (Post): 97.9 m[IU]/mL

## 2023-02-10 SURGERY — REVISION, AMPUTATION SITE
Anesthesia: General | Site: Leg Lower | Laterality: Left

## 2023-02-10 MED ORDER — PHENYLEPHRINE 80 MCG/ML (10ML) SYRINGE FOR IV PUSH (FOR BLOOD PRESSURE SUPPORT)
PREFILLED_SYRINGE | INTRAVENOUS | Status: DC | PRN
  Administered 2023-02-10 (×2): 160 ug via INTRAVENOUS
  Administered 2023-02-10: 80 ug via INTRAVENOUS
  Administered 2023-02-10: 160 ug via INTRAVENOUS

## 2023-02-10 MED ORDER — FENTANYL CITRATE (PF) 100 MCG/2ML IJ SOLN: 50.0000 ug | Freq: Once | INTRAMUSCULAR | Status: DC

## 2023-02-10 MED ORDER — SUGAMMADEX SODIUM 200 MG/2ML IV SOLN
INTRAVENOUS | Status: DC | PRN
  Administered 2023-02-10: 400 mg via INTRAVENOUS

## 2023-02-10 MED ORDER — ROPIVACAINE HCL 5 MG/ML IJ SOLN
INTRAMUSCULAR | Status: DC | PRN
  Administered 2023-02-10: 30 mL via PERINEURAL

## 2023-02-10 MED ORDER — LIDOCAINE 2% (20 MG/ML) 5 ML SYRINGE
INTRAMUSCULAR | Status: AC
  Filled 2023-02-10: qty 5

## 2023-02-10 MED ORDER — MIDODRINE HCL 5 MG PO TABS
5.0000 mg | ORAL_TABLET | Freq: Every day | ORAL | Status: DC
  Administered 2023-02-10 – 2023-02-12 (×3): 5 mg via ORAL
  Filled 2023-02-10 (×3): qty 1

## 2023-02-10 MED ORDER — SURGILUBE EX GEL
CUTANEOUS | Status: DC | PRN
  Administered 2023-02-10: 1 via TOPICAL

## 2023-02-10 MED ORDER — PROPOFOL 10 MG/ML IV BOLUS
INTRAVENOUS | Status: AC
  Filled 2023-02-10: qty 20

## 2023-02-10 MED ORDER — ALBUMIN HUMAN 5 % IV SOLN
INTRAVENOUS | Status: AC
  Filled 2023-02-10: qty 250

## 2023-02-10 MED ORDER — FENTANYL CITRATE (PF) 250 MCG/5ML IJ SOLN
INTRAMUSCULAR | Status: AC
  Filled 2023-02-10: qty 5

## 2023-02-10 MED ORDER — FENTANYL CITRATE (PF) 100 MCG/2ML IJ SOLN
INTRAMUSCULAR | Status: AC
  Administered 2023-02-10: 50 ug
  Filled 2023-02-10: qty 2

## 2023-02-10 MED ORDER — CHLORHEXIDINE GLUCONATE 0.12 % MT SOLN
OROMUCOSAL | Status: AC
  Administered 2023-02-10: 15 mL via OROMUCOSAL
  Filled 2023-02-10: qty 15

## 2023-02-10 MED ORDER — 0.9 % SODIUM CHLORIDE (POUR BTL) OPTIME
TOPICAL | Status: DC | PRN
  Administered 2023-02-10: 1000 mL

## 2023-02-10 MED ORDER — ROCURONIUM BROMIDE 10 MG/ML (PF) SYRINGE
PREFILLED_SYRINGE | INTRAVENOUS | Status: AC
  Filled 2023-02-10: qty 10

## 2023-02-10 MED ORDER — FENTANYL CITRATE (PF) 250 MCG/5ML IJ SOLN
INTRAMUSCULAR | Status: DC | PRN
  Administered 2023-02-10: 100 ug via INTRAVENOUS
  Administered 2023-02-10: 50 ug via INTRAVENOUS

## 2023-02-10 MED ORDER — SODIUM CHLORIDE 0.9 % IV SOLN: INTRAVENOUS | Status: DC

## 2023-02-10 MED ORDER — INSULIN ASPART 100 UNIT/ML IJ SOLN: 0.0000 [IU] | INTRAMUSCULAR | Status: DC | PRN

## 2023-02-10 MED ORDER — PHENYLEPHRINE 80 MCG/ML (10ML) SYRINGE FOR IV PUSH (FOR BLOOD PRESSURE SUPPORT)
PREFILLED_SYRINGE | INTRAVENOUS | Status: AC
  Filled 2023-02-10: qty 10

## 2023-02-10 MED ORDER — LIDOCAINE 2% (20 MG/ML) 5 ML SYRINGE
INTRAMUSCULAR | Status: DC | PRN
  Administered 2023-02-10: 40 mg via INTRAVENOUS

## 2023-02-10 MED ORDER — PHENYLEPHRINE HCL-NACL 20-0.9 MG/250ML-% IV SOLN
INTRAVENOUS | Status: DC | PRN
  Administered 2023-02-10: 25 ug/min via INTRAVENOUS

## 2023-02-10 MED ORDER — ONDANSETRON HCL 4 MG/2ML IJ SOLN
INTRAMUSCULAR | Status: DC | PRN
Start: 2023-02-10 — End: 2023-02-10
  Administered 2023-02-10: 4 mg via INTRAVENOUS

## 2023-02-10 MED ORDER — EPHEDRINE SULFATE-NACL 50-0.9 MG/10ML-% IV SOSY
PREFILLED_SYRINGE | INTRAVENOUS | Status: DC | PRN
  Administered 2023-02-10: 5 mg via INTRAVENOUS
  Administered 2023-02-10: 10 mg via INTRAVENOUS
  Administered 2023-02-10 (×2): 5 mg via INTRAVENOUS

## 2023-02-10 MED ORDER — ALBUMIN HUMAN 5 % IV SOLN
12.5000 g | Freq: Once | INTRAVENOUS | Status: AC
  Administered 2023-02-10: 12.5 g via INTRAVENOUS

## 2023-02-10 MED ORDER — MIDAZOLAM HCL 2 MG/2ML IJ SOLN
INTRAMUSCULAR | Status: AC
  Filled 2023-02-10: qty 2

## 2023-02-10 MED ORDER — CHLORHEXIDINE GLUCONATE 0.12 % MT SOLN: 15.0000 mL | Freq: Once | OROMUCOSAL | Status: AC

## 2023-02-10 MED ORDER — ROCURONIUM BROMIDE 10 MG/ML (PF) SYRINGE
PREFILLED_SYRINGE | INTRAVENOUS | Status: DC | PRN
  Administered 2023-02-10: 20 mg via INTRAVENOUS
  Administered 2023-02-10: 50 mg via INTRAVENOUS

## 2023-02-10 MED ORDER — ONDANSETRON HCL 4 MG/2ML IJ SOLN
INTRAMUSCULAR | Status: AC
  Filled 2023-02-10: qty 2

## 2023-02-10 MED ORDER — ORAL CARE MOUTH RINSE: 15.0000 mL | Freq: Once | OROMUCOSAL | Status: AC

## 2023-02-10 MED ORDER — PROPOFOL 10 MG/ML IV BOLUS
INTRAVENOUS | Status: DC | PRN
  Administered 2023-02-10: 120 mg via INTRAVENOUS

## 2023-02-10 SURGICAL SUPPLY — 37 items
BAG COUNTER SPONGE SURGICOUNT (BAG) ×2 IMPLANT
BAG SPNG CNTER NS LX DISP (BAG) ×2
BLADE SAW RECIP 87.9 MT (BLADE) IMPLANT
CANISTER SUCT 3000ML PPV (MISCELLANEOUS) ×2 IMPLANT
CANISTER WOUND CARE 500ML ATS (WOUND CARE) IMPLANT
CLIP TI MEDIUM 6 (CLIP) IMPLANT
COVER SURGICAL LIGHT HANDLE (MISCELLANEOUS) ×2 IMPLANT
DRAPE DERMATAC (DRAPES) IMPLANT
DRAPE ORTHO SPLIT 77X108 STRL (DRAPES) ×4
DRAPE SURG ORHT 6 SPLT 77X108 (DRAPES) IMPLANT
DRSG CUTIMED SORBACT 7X9 (GAUZE/BANDAGES/DRESSINGS) IMPLANT
DRSG VAC GRANUFOAM SM (GAUZE/BANDAGES/DRESSINGS) IMPLANT
DRSG VERSA FOAM LRG 10X15 (GAUZE/BANDAGES/DRESSINGS) IMPLANT
ELECT REM PT RETURN 9FT ADLT (ELECTROSURGICAL) ×2
ELECTRODE REM PT RTRN 9FT ADLT (ELECTROSURGICAL) ×2 IMPLANT
GLOVE BIO SURGEON STRL SZ7.5 (GLOVE) IMPLANT
GLOVE BIOGEL PI IND STRL 7.5 (GLOVE) ×2 IMPLANT
GLOVE BIOGEL PI IND STRL 8 (GLOVE) IMPLANT
GLOVE SURG UNDER LTX SZ8 (GLOVE) ×2 IMPLANT
GOWN STRL REUS W/ TWL LRG LVL3 (GOWN DISPOSABLE) ×6 IMPLANT
GOWN STRL REUS W/TWL LRG LVL3 (GOWN DISPOSABLE) ×6
GRAFT MYRIAD 7X10 (Graft) IMPLANT
KIT BASIN OR (CUSTOM PROCEDURE TRAY) ×2 IMPLANT
KIT TURNOVER KIT B (KITS) ×2 IMPLANT
NS IRRIG 1000ML POUR BTL (IV SOLUTION) ×2 IMPLANT
PACK GENERAL/GYN (CUSTOM PROCEDURE TRAY) ×2 IMPLANT
PAD ARMBOARD 7.5X6 YLW CONV (MISCELLANEOUS) ×4 IMPLANT
POWDER MYRIAD MORCELLS 500MG (Miscellaneous) IMPLANT
SPONGE LAP 18X18 X RAY DECT (DISPOSABLE) IMPLANT
STAPLER VISISTAT 35W (STAPLE) IMPLANT
SURGILUBE 2OZ TUBE FLIPTOP (MISCELLANEOUS) IMPLANT
SUT ETHILON 2 0 PSLX (SUTURE) IMPLANT
SUT SILK 2 0 SH CR/8 (SUTURE) IMPLANT
SUT VIC AB 2-0 CT1 18 (SUTURE) IMPLANT
SUT VIC AB 3-0 SH 18 (SUTURE) IMPLANT
TOWEL GREEN STERILE (TOWEL DISPOSABLE) ×2 IMPLANT
WATER STERILE IRR 1000ML POUR (IV SOLUTION) IMPLANT

## 2023-02-10 NOTE — Progress Notes (Signed)
[facility] PROGRESS NOTE  [name] [name] (DOB: 1955-10-17) WUJ:[id number] PCP: [name], PA-C  Brief Narrative: [name] [name] is a 67 y.o. male with a history of PAD, T2DM, HTN, HLD, AFib, CAD s/p CABG, OSA, ESRD on HD M&F, CHF, and left BKA on 8/5, discharged home with wound vac on 8/8 who presented to the ED on 02/05/2023 with redness, and painful swelling at the stump site. He was admitted, placed on IV antibiotics, for stump site infection with vascular surgery consulting. Eliquis has been held in preparation for possible surgery. I&D planned 8/20 per vascular surgery. PT/OT consulted. Routine HD ongoing.   Subjective: No complaints, denies bleeding this morning. No dyspnea, chest pain, abd pain. When pressing on his abdomen during exam he says it feels sore on the right side, but very mild.   Objective: BP (!) 111/45 (BP Location: Left Arm)   Pulse 91   Temp 98.3 F (36.8 C)   Resp 18   Ht 5\' 10"  (1.778 m)   Wt 112.9 kg Comment: bed  SpO2 99%   BMI 35.71 kg/m   Gen: Chronically ill-appearing male in no distress Pulm: Clear, nonlabored  CV: RRR, no MRG.  GI: Protuberant at his baseline based on prior exams, not taut, very mild tenderness to deep palpation on right lower > upper quadrants.  Neuro: Alert and oriented. No new focal deficits. Ext: Warm, Left BKA site still with anterior flap erythema, lateral necrotic eschar, no active discharge.    Assessment & Plan: Principal Problem:   Amputation stump infection (HCC) Active Problems:   LV dysfunction   Paroxysmal atrial fibrillation (HCC)   Chronic anticoagulation   Benign essential HTN   Dysrhythmia   OSA (obstructive sleep apnea)   S/P CABG x 3   Coronary artery disease   End stage renal disease (HCC)   S/P BKA (below knee amputation), left (HCC)  PVD s/p BKA 8/5 with stump site infection:  - Continue vancomycin, zosyn. Remains infected-appearing despite several days of IV antibiotics.  - Vascular surgery  following > I&D 8/20. At very high risk of AKA.  - Holding DOAC.    Anemia of CKD:  - Hgb down, got transfusion 8/17, hgb up as anticipated, continue monitoring. Stopping anticoagulation. ESA qFriday per nephrology.   Nausea, vomiting, liquid stools: Improving.  - Probiotics, antiemetics, taper opioids as able - Abd exam slightly distended but nontender 8/20. Got a dose of imodium which we'll stop. Remains afebrile.  - RUQ U/S.  ESRD: s/p AKI during CABG admission, still makes urine. - Continue HD per routine Mon/Fri, appreciate assistance by nephrology. Continue midodrine.   CAD: s/p CABG, HLD:  - Hypotension limiting metoprolol. Continue statin   Chronic HFpEF:  - Continue home demadex and dialysis, appears euvolemic    PAF: Rate controlled. - Continue amiodarone. Holding metoprolol with soft BPs.  - Stopped IV heparin with ongoing bleeding. Pt aware of risk of stroke and agrees with holding anticoagulation at this time. Holding eliquis given need for surgery today.   Acute urinary retention: Occurred at [facility] prior to admission here.  - Passing urine after foley removed. Some hematuria.   IDT2DM: Recent HbA1c 5.9%.  - Glargine 10u BID, SSI. May need to deescalate home regimen.    COPD:  - Continue controller BD, prn  OSA:  - CPAP qHS  Tyrone Nine, MD [facility] [email or web address] 02/10/2023, 7:57 AM

## 2023-02-10 NOTE — Transfer of Care (Signed)
Immediate Anesthesia Transfer of Care Note  Patient: Cinsere Damboise  Procedure(s) Performed: LEFT BELOW KNEE AMPUTATION REVISION (Left) APPLICATION OF WOUND VAC (Left) APPLICATION OF SKIN SUBSTITUTE MYRIAD MATRIX 7x10 CM AND MORCELS 500MG  (Left: Leg Lower)  Patient Location: PACU  Anesthesia Type:General  Level of Consciousness: drowsy  Airway & Oxygen Therapy: Patient Spontanous Breathing and Patient connected to face mask oxygen  Post-op Assessment: Report given to RN and Post -op Vital signs reviewed and stable  Post vital signs: Reviewed and stable  Last Vitals:  Vitals Value Taken Time  BP 87/48 02/10/23 1315  Temp    Pulse 89 02/10/23 1316  Resp 15 02/10/23 1317  SpO2 99 % 02/10/23 1316  Vitals shown include unfiled device data.  Last Pain:  Vitals:   02/10/23 0918  TempSrc: Oral  PainSc: 7       Patients Stated Pain Goal: 0 (02/09/23 1341)  Complications: No notable events documented.

## 2023-02-10 NOTE — Evaluation (Signed)
Occupational Therapy Evaluation Patient Details Name: [name] [name] MRN: [id number] DOB: September 04, 1955 Today's Date: 02/10/2023   History of Present Illness 67 y.o. male with recent left BKA on 8/5, discharged home with wound vac on 8/8 who presented to the ED on 02/05/2023 with redness, and painful swelling at the stump site. Planned BKA revision for 8/20. PMHx:AKI with ESRD and HD, CHF, CAD s/p CABG x3,  DM, diabetic neuropathy, OSA,   Clinical Impression   Pt admitted with the above diagnosis. Undergoing planned BKA revision today (8/20).  Pt currently with functional limitations due to the deficits listed below (see OT Problem List). Prior to BKA, pt reports that he was independent with all ADL tasks and functional mobility. Since Left BKA on 8/5, pt has required increased assist with all BADL tasks and functional transfers. He has not been able to ambulate.  Pt will benefit from acute skilled OT to increase their safety and independence with ADL and functional mobility for ADL to facilitate discharge. Patient will benefit from continued inpatient follow up therapy, <3 hours/day. He does have potential to progress to home after discharge. OT will continue to follow patient acutely.          If plan is discharge home, recommend the following: Two people to help with walking and/or transfers;Two people to help with bathing/dressing/bathroom;Assistance with cooking/housework;Assist for transportation;Help with stairs or ramp for entrance    Functional Status Assessment  Patient has had a recent decline in their functional status and demonstrates the ability to make significant improvements in function in a reasonable and predictable amount of time.  Equipment Recommendations  None recommended by OT       Precautions / Restrictions Precautions Precautions: Fall Restrictions Weight Bearing Restrictions: Yes LLE Weight Bearing: Non weight bearing      Mobility Bed Mobility Overal bed  mobility: Needs Assistance Bed Mobility: Supine to Sit     Supine to sit: Total assist, +2 for physical assistance, Used rails, HOB elevated Sit to supine: Mod assist, Used rails, +2 for physical assistance   General bed mobility comments: pt able to initiate movement but is unable to move LE across bed pad nor reach across body to rail without outside assist, once hips pulled to EOB with bed pad and HOB elevated pt able to utilize elbows to push to upright    Transfers Overall transfer level: Needs assistance Equipment used: 2 person hand held assist       Lateral/Scoot Transfers: +2 physical assistance, Mod assist General transfer comment: pt able to perform lateral scooting along EOB with modAx2, mainly to assist with bed pad given increased difficulty to slide on linens, pt only able to provide minimal clearance of hips with UE and R LE push off      Balance Overall balance assessment: Needs assistance Sitting-balance support: No upper extremity supported, Feet supported Sitting balance-Leahy Scale: Fair Sitting balance - Comments: able to sit without UE support for short bouts but benefits from UE support Postural control: Other (comment) (insufficient and delayed)        ADL either performed or assessed with clinical judgement   ADL Overall ADL's : Needs assistance/impaired Eating/Feeding: Independent;Bed level   Grooming: Oral care;Set up;Bed level   Upper Body Bathing: Minimal assistance;Bed level   Lower Body Bathing: Maximal assistance;Bed level   Upper Body Dressing : Minimal assistance;Bed level   Lower Body Dressing: Total assistance;Bed level   Toilet Transfer:  (Pt declined transfer out of bed)   Toileting-  Clothing Manipulation and Hygiene: Total assistance;Bed level (foley catheter)            Vision Baseline Vision/History: 1 Wears glasses Ability to See in Adequate Light: 0 Adequate Patient Visual Report: No change from baseline               Pertinent Vitals/Pain Pain Assessment Pain Assessment: 0-10 Pain Score: 8  Pain Location: left LE Pain Descriptors / Indicators: Grimacing, Guarding, Sore Pain Intervention(s): Limited activity within patient's tolerance, Monitored during session, Repositioned     Extremity/Trunk Assessment Upper Extremity Assessment Upper Extremity Assessment: Generalized weakness   Lower Extremity Assessment Lower Extremity Assessment: Defer to PT evaluation RLE Deficits / Details: ROM RLE Sensation: decreased light touch (from ankle down) LLE Deficits / Details: L residual limb, darkened around surgical staples, hip strength grossly 2/3 in sitting, L knee lacks full ext/flex, strength not assessed due to pain   Cervical / Trunk Assessment Cervical / Trunk Assessment: Normal   Communication Communication Communication: No apparent difficulties   Cognition Arousal: Alert Behavior During Therapy: WFL for tasks assessed/performed Overall Cognitive Status: No family/caregiver present to determine baseline cognitive functioning Area of Impairment: Safety/judgement    Safety/Judgement: Decreased awareness of deficits, Decreased awareness of safety           General Comments  VSS on RA            Home Living Family/patient expects to be discharged to:: Private residence Living Arrangements: Spouse/significant other;Children Available Help at Discharge: Family;Available 24 hours/day Type of Home: House Home Access: Ramped entrance     Home Layout: One level     Bathroom Shower/Tub: Producer, television/film/video: Handicapped height     Home Equipment: Grab bars - toilet;Grab bars - tub/shower;BSC/3in1;Rollator (4 wheels);Wheelchair - manual;Other (comment) (SB, drop arm BSC bari, slide board)          Prior Functioning/Environment Prior Level of Function : Independent/Modified Independent;Driving (prior to BKA. Post BKA required assist with transfers and all ADL  tasks.)             Mobility Comments: Prior to Lt BKA pt was walking without AD. SInce BKA, pt has been using a sliding board at home for transfers. Had started PT. ADLs Comments: Prior to BKA, drives himself to HD, stands to shower. Now needs assist with bath/dress, taking bird bath at the moment.        OT Problem List: Decreased strength;Decreased activity tolerance;Impaired balance (sitting and/or standing);Decreased safety awareness;Decreased knowledge of use of DME or AE;Obesity;Pain      OT Treatment/Interventions: Self-care/ADL training;Therapeutic exercise;Energy conservation;DME and/or AE instruction;Manual therapy;Therapeutic activities;Patient/family education;Balance training    OT Goals(Current goals can be found in the care plan section) Acute Rehab OT Goals Patient Stated Goal: to go home OT Goal Formulation: Patient unable to participate in goal setting Time For Goal Achievement: 02/24/23 Potential to Achieve Goals: Good  OT Frequency: Min 1X/week    Co-evaluation PT/OT/SLP Co-Evaluation/Treatment: Yes Reason for Co-Treatment: To address functional/ADL transfers;Complexity of the patient's impairments (multi-system involvement)   OT goals addressed during session: ADL's and self-care;Strengthening/ROM      AM-PAC OT "6 Clicks" Daily Activity     Outcome Measure Help from another person eating meals?: None Help from another person taking care of personal grooming?: A Little Help from another person toileting, which includes using toliet, bedpan, or urinal?: Total Help from another person bathing (including washing, rinsing, drying)?: Total Help from another person to put on and  taking off regular upper body clothing?: A Little Help from another person to put on and taking off regular lower body clothing?: Total 6 Click Score: 13   End of Session    Activity Tolerance: Patient limited by fatigue;Patient limited by pain Patient left: in bed;with call  bell/phone within reach;with bed alarm set;Other (comment) (with transporter for OR)  OT Visit Diagnosis: Pain;Muscle weakness (generalized) (M62.81) Pain - Right/Left: Left Pain - part of body: Leg                Time: 6301-6010 OT Time Calculation (min): 15 min Charges:  OT General Charges $OT Visit: 1 Visit OT Evaluation $OT Eval Moderate Complexity: 1 Mod  [employer], OTR/L,CBIS  Supplemental OT - [facility] and [facility] Secure Chat Preferred    Adasia Hoar, Charisse March 02/10/2023, 12:09 PM

## 2023-02-10 NOTE — Op Note (Signed)
    NAME: [name] [name]    MRN: [id number] DOB: Apr 06, 1956    DATE OF OPERATION: 02/10/2023  PREOP DIAGNOSIS:    Nonhealing left below the knee amputation  POSTOP DIAGNOSIS:    Same  PROCEDURE:    Evacuation of hematoma left below the knee amputation Revision of left below the knee amputation Placement of biologic dressing VAC  SURGEON: Di Kindle. Edilia Bo, MD  ASSIST: Kayren Eaves, PA  ANESTHESIA: General  EBL: Minimal  INDICATIONS:    [name] is a 67 y.o. male who underwent a left below the knee amputation on 01/26/2023.  He presented with cellulitis and compromised skin and was admitted to the hospital.  He presents today for revision  FINDINGS:   There was significant hematoma that was evacuated.  I had to shorten the tibia approximately 3 cm and was able to get muscle coverage over the bone.  In the lateral wound which remained after I removed devitalized skin a biologic dressing and VAC were placed.  TECHNIQUE:   Patient was brought to the operating room and received a general anesthetic.  The left below the knee amputation was prepped and draped in usual sterile fashion.  I initially remove the sutures laterally and open the wound.  There was significant hematoma in the anterior compartment and also in the lateral aspect of the amputation site.  Ultimately elected to open the entire amputation site to allow better exposure of the bone as I was not able to get good exposure otherwise.  There was significant devitalized tissue with full-thickness necrosis laterally and this area was excised.  It was approximately 8 cm in length by 5-1/2 cm in width.  I shorten the bone approximately 3 cm.  The edges of the bone were rasped.  Hemostasis was obtained in the wound.  I closed the lateral aspect with interrupted 2 oh nylons.  Along medial aspect this was closed with interrupted nylons.  This left a defect that was 8 cm in length and 5 and half centimeters in width.  The  tibia was covered by muscle.  The anterior compartment had some dead space Myriad Morcells (500 mg) were placed in this dead space and also on the open wound on the lateral aspect of the amputation site.  This was then covered with the matrix.  On top of this we placed  sorbact which was sutured into place and then we applied surgery loop.  White sponge was packed into the dead space anteriorly and then the wound was covered with a black sponge and the VAC dressing was placed.  There was a good seal.  Patient tolerated procedure well was transferred to recovery in stable condition.  All needle and sponge counts were correct.  Given the complexity of the case a first assistant was necessary in order to expedient the procedure and safely perform the technical aspects of the operation.  Waverly Ferrari, MD, FACS [facility]  DATE OF DICTATION:   02/10/2023

## 2023-02-10 NOTE — Anesthesia Procedure Notes (Addendum)
Procedure Name: Intubation Date/Time: 02/10/2023 11:37 AM  Performed by: Camillia Herter, CRNAPre-anesthesia Checklist: Patient identified, Emergency Drugs available, Suction available and Patient being monitored Patient Re-evaluated:Patient Re-evaluated prior to induction Oxygen Delivery Method: Circle System Utilized Preoxygenation: Pre-oxygenation with 100% oxygen Induction Type: IV induction Ventilation: Mask ventilation without difficulty Laryngoscope Size: Glidescope and 3 Grade View: Grade I Tube type: Oral Tube size: 7.5 mm Number of attempts: 1 Airway Equipment and Method: Stylet and Oral airway Placement Confirmation: ETT inserted through vocal cords under direct vision, positive ETCO2 and breath sounds checked- equal and bilateral Secured at: 23 cm Tube secured with: Tape Dental Injury: Teeth and Oropharynx as per pre-operative assessment

## 2023-02-10 NOTE — Anesthesia Preprocedure Evaluation (Addendum)
Anesthesia Evaluation  Patient identified by MRN, date of birth, ID band Patient awake    Reviewed: Allergy & Precautions, H&P , NPO status , Patient's Chart, lab work & pertinent test results  Airway Mallampati: II  TM Distance: >3 FB Neck ROM: Full    Dental no notable dental hx.    Pulmonary former smoker   Pulmonary exam normal breath sounds clear to auscultation       Cardiovascular hypertension, + CAD, + CABG and + Peripheral Vascular Disease  Normal cardiovascular exam Rhythm:Regular Rate:Normal  Left Ventricle: Left ventricular ejection fraction, by estimation, is 55  to 60%. The left ventricle has normal function. The left ventricle has no  regional wall motion abnormalities. The left ventricular internal cavity  size was normal in size. There is   no left ventricular hypertrophy.   Right Ventricle: The right ventricular size is normal. No increase in  right ventricular wall thickness. Right ventricular systolic function is  mildly reduced. Tricuspid regurgitation signal is inadequate for assessing  PA pressure.   Left Atrium: Left atrial size was mildly dilated. No left atrial/left  atrial appendage thrombus was detected.   Right Atrium: Right atrial size was normal in size.   Pericardium: A small pericardial effusion is present.   Mitral Valve: The mitral valve is normal in structure. Mild mitral valve  regurgitation. No evidence of mitral valve stenosis.   Tricuspid Valve: The tricuspid valve is normal in structure. Tricuspid  valve regurgitation is trivial.   Aortic Valve: The aortic valve is tricuspid. There is mild calcification  of the aortic valve. Aortic valve regurgitation is not visualized. No  aortic stenosis is present.   Pulmonic Valve: The pulmonic valve was normal in structure. Pulmonic valve  regurgitation is not visualized.   Aorta: The aortic root is normal in size and structure.    IAS/Shunts: There is a PFO by color doppler and bubble study. Agitated  saline contrast was given intravenously to evaluate for intracardiac  shunting.      Neuro/Psych negative neurological ROS  negative psych ROS   GI/Hepatic negative GI ROS, Neg liver ROS,,,  Endo/Other  diabetes, Type 2    Renal/GU negative Renal ROS  negative genitourinary   Musculoskeletal negative musculoskeletal ROS (+)    Abdominal   Peds negative pediatric ROS (+)  Hematology  (+) Blood dyscrasia, anemia   Anesthesia Other Findings   Reproductive/Obstetrics negative OB ROS                             Anesthesia Physical Anesthesia Plan  ASA: 4  Anesthesia Plan: General   Post-op Pain Management: Regional block*   Induction: Intravenous  PONV Risk Score and Plan: 2 and Ondansetron, Dexamethasone and Treatment may vary due to age or medical condition  Airway Management Planned: Oral ETT  Additional Equipment:   Intra-op Plan:   Post-operative Plan: Extubation in OR  Informed Consent: I have reviewed the patients History and Physical, chart, labs and discussed the procedure including the risks, benefits and alternatives for the proposed anesthesia with the patient or authorized representative who has indicated his/her understanding and acceptance.     Dental advisory given  Plan Discussed with: CRNA and Surgeon  Anesthesia Plan Comments:        Anesthesia Quick Evaluation

## 2023-02-10 NOTE — Anesthesia Procedure Notes (Signed)
Anesthesia Regional Block: Femoral nerve block   Pre-Anesthetic Checklist: , timeout performed,  Correct Patient, Correct Site, Correct Laterality,  Correct Procedure, Correct Position, site marked,  Risks and benefits discussed,  Surgical consent,  Pre-op evaluation,  At surgeon's request and post-op pain management  Laterality: Left  Prep: chloraprep       Needles:  Injection technique: Single-shot  Needle Type: Echogenic Needle     Needle Length: 9cm      Additional Needles:   Procedures:,,,, ultrasound used (permanent image in chart),,    Narrative:  Start time: 02/10/2023 10:06 AM End time: 02/10/2023 10:16 AM Injection made incrementally with aspirations every 5 mL.  Performed by: Personally  Anesthesiologist: Eilene Ghazi, MD  Additional Notes: Patient tolerated the procedure well without complications

## 2023-02-10 NOTE — Evaluation (Signed)
Physical Therapy Evaluation Patient Details Name: [name] [name] MRN: [id number] DOB: [birth year] Today's Date: 02/10/2023  History of Present Illness  67 y.o. male with recent left BKA on 8/5, discharged home with wound vac on 8/8 who presented to the ED on 02/05/2023 with redness, and painful swelling at the stump site. Planned BKA revision for 8/20. PMHx:AKI with ESRD and HD, CHF, CAD s/p CABG x3,  DM, diabetic neuropathy, OSA,  Clinical Impression  At discharge 2 weeks ago pt was able to perform bed mobility with min A and slide board transfers to wheelchair and drop arm bedside commode with modAx2. Pt limited in safe mobility on this admission by increased R LE pain and decreased strength, and ROM in bilateral LE R>L. Pt is currently total Ax2 for bed mobility and modAx2 for lateral scoot transfers along EoB. PT/OT session abbreviated due to transport arrival for surgical revision of R BKA. Given assessed level of function today, patient will benefit from continued inpatient follow up therapy, <3 hours/day. PT will follow back after surgery to re-evaluate mobility, however due to decreased level of strength pt will likely be at same level of function.            If plan is discharge home, recommend the following: Two people to help with walking and/or transfers;Two people to help with bathing/dressing/bathroom;Assistance with cooking/housework;Direct supervision/assist for medications management;Direct supervision/assist for financial management;Assist for transportation;Help with stairs or ramp for entrance   Can travel by private vehicle   No    Equipment Recommendations None recommended by PT     Functional Status Assessment Patient has had a recent decline in their functional status and demonstrates the ability to make significant improvements in function in a reasonable and predictable amount of time.     Precautions / Restrictions Precautions Precautions: Fall Restrictions Weight  Bearing Restrictions: Yes LLE Weight Bearing: Non weight bearing      Mobility  Bed Mobility Overal bed mobility: Needs Assistance Bed Mobility: Supine to Sit     Supine to sit: Total assist Sit to supine: Mod assist, Used rails   General bed mobility comments: pt able to initiate movement but is unable to move LE across bed pad nor reach across body to rail without outside assist, once hips pulled to Eob with bed pad and HoB elevated pt able to utilize elbows to push to upright    Transfers Overall transfer level: Needs assistance Equipment used: 2 person hand held assist              Lateral/Scoot Transfers: +2 physical assistance, Mod assist General transfer comment: pt able to perform lateral scooting along EoB with modAx2, mainly to assist with bed pad given increased difficulty to slide on linens, pt only able to provide minimal clearance of hips with UE and R LE push off         Balance Overall balance assessment: Needs assistance Sitting-balance support: No upper extremity supported, Feet supported Sitting balance-Leahy Scale: Fair Sitting balance - Comments: able to sit without UE support for short bouts but benefits from UE support                                     Pertinent Vitals/Pain Pain Assessment Pain Assessment: 0-10 Pain Score: 8  Pain Location: left LE Pain Descriptors / Indicators: Grimacing, Guarding, Sore    Home Living Family/patient expects to be discharged to:: Private  residence Living Arrangements: Spouse/significant other;Children Available Help at Discharge: Family;Available 24 hours/day Type of Home: House Home Access: Ramped entrance       Home Layout: One level Home Equipment: Grab bars - toilet;Grab bars - tub/shower;BSC/3in1;Rollator (4 wheels);Wheelchair - manual;Other (comment) (SB, drop arm BSC bari, slide board)      Prior Function Prior Level of Function : Independent/Modified Independent;Driving  (prior to BKA. Post BKA required assist with transfers and all ADL tasks.)             Mobility Comments: Prior to Lt BKA pt was walking without AD. SInce BKA, pt has been using a sliding board at home for transfers. Had started PT. ADLs Comments: Prior to BKA, drives himself to HD, stands to shower. Now needs assist with bath/dress, taking bird bath at the moment.     Extremity/Trunk Assessment   Upper Extremity Assessment Upper Extremity Assessment: Defer to OT evaluation    Lower Extremity Assessment Lower Extremity Assessment: RLE deficits/detail;LLE deficits/detail RLE Deficits / Details: ROM RLE Sensation: decreased light touch (from ankle down) LLE Deficits / Details: L residual limb, darkened around surgical staples, hip strength grossly 2/3 in sitting, L knee lacks full ext/flex, strength not assessed due to pain    Cervical / Trunk Assessment Cervical / Trunk Assessment: Normal  Communication   Communication Communication: No apparent difficulties  Cognition Arousal: Alert Behavior During Therapy: WFL for tasks assessed/performed Overall Cognitive Status: No family/caregiver present to determine baseline cognitive functioning Area of Impairment: Safety/judgement                         Safety/Judgement: Decreased awareness of deficits, Decreased awareness of safety              General Comments General comments (skin integrity, edema, etc.): VSS on RA        Assessment/Plan    PT Assessment Patient needs continued PT services  PT Problem List Decreased strength;Decreased range of motion;Decreased activity tolerance;Decreased balance;Decreased mobility;Impaired sensation;Decreased skin integrity;Pain       PT Treatment Interventions DME instruction;Gait training;Functional mobility training;Therapeutic activities;Therapeutic exercise;Neuromuscular re-education;Patient/family education;Wheelchair mobility training    PT Goals (Current goals  can be found in the Care Plan section)  Acute Rehab PT Goals Patient Stated Goal: go home PT Goal Formulation: With patient Time For Goal Achievement: 02/24/23 Potential to Achieve Goals: Fair    Frequency Min 1X/week        AM-PAC PT "6 Clicks" Mobility  Outcome Measure Help needed turning from your back to your side while in a flat bed without using bedrails?: A Lot Help needed moving from lying on your back to sitting on the side of a flat bed without using bedrails?: Total Help needed moving to and from a bed to a chair (including a wheelchair)?: Total Help needed standing up from a chair using your arms (e.g., wheelchair or bedside chair)?: Total Help needed to walk in hospital room?: Total Help needed climbing 3-5 steps with a railing? : Total 6 Click Score: 7    End of Session   Activity Tolerance: Patient limited by pain;Other (comment) (transport present to take to surgery) Patient left: in bed;Other (comment) (transport in room) Nurse Communication: Mobility status PT Visit Diagnosis: Other abnormalities of gait and mobility (R26.89);Muscle weakness (generalized) (M62.81);Pain Pain - Right/Left: Left Pain - part of body: Leg    Time: 4098-1191 PT Time Calculation (min) (ACUTE ONLY): 17 min   Charges:   PT  Evaluation $PT Eval Moderate Complexity: 1 Mod   PT General Charges $$ ACUTE PT VISIT: 1 Visit         Markus Casten B. Beverely Risen PT, DPT Acute Rehabilitation Services Please use secure chat or  Call Office [phone]   Elon Alas Fleet 02/10/2023, 9:25 AM

## 2023-02-10 NOTE — Anesthesia Postprocedure Evaluation (Signed)
Anesthesia Post Note  Patient: Keyson Hartzel  Procedure(s) Performed: LEFT BELOW KNEE AMPUTATION REVISION (Left) APPLICATION OF WOUND VAC (Left) APPLICATION OF SKIN SUBSTITUTE MYRIAD MATRIX 7x10 CM AND MORCELS 500MG  (Left: Leg Lower)     Patient location during evaluation: PACU Anesthesia Type: General Level of consciousness: awake and alert Pain management: pain level controlled Vital Signs Assessment: post-procedure vital signs reviewed and stable Respiratory status: spontaneous breathing, nonlabored ventilation, respiratory function stable and patient connected to nasal cannula oxygen Cardiovascular status: blood pressure returned to baseline and stable Postop Assessment: no apparent nausea or vomiting Anesthetic complications: no  No notable events documented.  Last Vitals:  Vitals:   02/10/23 1400 02/10/23 1415  BP: (!) 91/52 (!) 105/50  Pulse: 94 92  Resp: (!) 22 16  Temp:    SpO2: 100% 95%    Last Pain:  Vitals:   02/10/23 0918  TempSrc: Oral  PainSc: 7                  Sofia Jaquith S

## 2023-02-10 NOTE — Anesthesia Procedure Notes (Signed)
Anesthesia Procedure Image    

## 2023-02-10 NOTE — Progress Notes (Signed)
[facility] OF  PROGRESS NOTE  ASSESSMENT / PLAN: [name] [name] is a 67 y.o. male with left below knee amputation infection with skin necrosis. Plan for debridement and VAC application in OR today. Keep NPO.    SUBJECTIVE: No complaints. Reviewed plan.  OBJECTIVE: BP (!) 111/45 (BP Location: Left Arm)   Pulse 91   Temp 98.3 F (36.8 C)   Resp 18   Ht 5\' 10"  (1.778 m)   Wt 112.9 kg Comment: bed  SpO2 99%   BMI 35.71 kg/m   Intake/Output Summary (Last 24 hours) at 02/10/2023 0802 Last data filed at 02/10/2023 0414 Gross per 24 hour  Intake 463.16 ml  Output 1800 ml  Net -1336.84 ml    No distress Unchanged appearance of L BKA     Latest Ref Rng & Units 02/10/2023   12:56 AM 02/09/2023    3:27 AM 02/08/2023    4:28 AM  CBC  WBC 4.0 - 10.5 K/uL 25.3  24.2  20.8   Hemoglobin 13.0 - 17.0 g/dL 7.7  7.4  7.7   Hematocrit 39.0 - 52.0 % 24.2  24.0  24.2   Platelets 150 - 400 K/uL 448  422  423         Latest Ref Rng & Units 02/09/2023    3:27 AM 02/08/2023    4:28 AM 02/07/2023    2:07 AM  CMP  Glucose 70 - 99 mg/dL 161  096  045   BUN 8 - 23 mg/dL 68  56  43   Creatinine 0.61 - 1.24 mg/dL 4.09  8.11  9.14   Sodium 135 - 145 mmol/L 132  134  134   Potassium 3.5 - 5.1 mmol/L 3.1  3.5  3.5   Chloride 98 - 111 mmol/L 96  96  96   CO2 22 - 32 mmol/L 21  24  26    Calcium 8.9 - 10.3 mg/dL 7.4  7.6  7.4     Estimated Creatinine Clearance: 14.3 mL/min (A) (by C-G formula based on SCr of 6.22 mg/dL (H)).  Rande Brunt. Lenell Antu, MD [facility] Phone Number: [phone] 02/10/2023 8:02 AM

## 2023-02-10 NOTE — H&P (View-Only) (Signed)
[facility] OF  PROGRESS NOTE  ASSESSMENT / PLAN: [name] [name] is a 67 y.o. male with left below knee amputation infection with skin necrosis. Plan for debridement and VAC application in OR today. Keep NPO.    SUBJECTIVE: No complaints. Reviewed plan.  OBJECTIVE: BP (!) 111/45 (BP Location: Left Arm)   Pulse 91   Temp 98.3 F (36.8 C)   Resp 18   Ht 5\' 10"  (1.778 m)   Wt 112.9 kg Comment: bed  SpO2 99%   BMI 35.71 kg/m   Intake/Output Summary (Last 24 hours) at 02/10/2023 0802 Last data filed at 02/10/2023 0414 Gross per 24 hour  Intake 463.16 ml  Output 1800 ml  Net -1336.84 ml    No distress Unchanged appearance of L BKA     Latest Ref Rng & Units 02/10/2023   12:56 AM 02/09/2023    3:27 AM 02/08/2023    4:28 AM  CBC  WBC 4.0 - 10.5 K/uL 25.3  24.2  20.8   Hemoglobin 13.0 - 17.0 g/dL 7.7  7.4  7.7   Hematocrit 39.0 - 52.0 % 24.2  24.0  24.2   Platelets 150 - 400 K/uL 448  422  423         Latest Ref Rng & Units 02/09/2023    3:27 AM 02/08/2023    4:28 AM 02/07/2023    2:07 AM  CMP  Glucose 70 - 99 mg/dL 161  096  045   BUN 8 - 23 mg/dL 68  56  43   Creatinine 0.61 - 1.24 mg/dL 4.09  8.11  9.14   Sodium 135 - 145 mmol/L 132  134  134   Potassium 3.5 - 5.1 mmol/L 3.1  3.5  3.5   Chloride 98 - 111 mmol/L 96  96  96   CO2 22 - 32 mmol/L 21  24  26    Calcium 8.9 - 10.3 mg/dL 7.4  7.6  7.4     Estimated Creatinine Clearance: 14.3 mL/min (A) (by C-G formula based on SCr of 6.22 mg/dL (H)).  Rande Brunt. Lenell Antu, MD [facility] Phone Number: [phone] 02/10/2023 8:02 AM

## 2023-02-10 NOTE — Plan of Care (Signed)

## 2023-02-10 NOTE — Interval H&P Note (Signed)
History and Physical Interval Note:  02/10/2023 10:30 AM  [name] [name]  has presented today for surgery, with the diagnosis of Nonhealing left Below knee amputation.  The various methods of treatment have been discussed with the patient and family. After consideration of risks, benefits and other options for treatment, the patient has consented to  Procedure(s): LEFT BELOW KNEE AMPUTATION REVISION (Left) APPLICATION OF WOUND VAC (Left) as a surgical intervention.  The patient's history has been reviewed, patient examined, no change in status, stable for surgery.  I have reviewed the patient's chart and labs.  Questions were answered to the patient's satisfaction.     Waverly Ferrari

## 2023-02-11 ENCOUNTER — Encounter (HOSPITAL_COMMUNITY): Payer: Medicare HMO

## 2023-02-11 ENCOUNTER — Encounter (HOSPITAL_COMMUNITY): Payer: Self-pay | Admitting: Vascular Surgery

## 2023-02-11 DIAGNOSIS — T8740 Infection of amputation stump, unspecified extremity: Secondary | ICD-10-CM | POA: Diagnosis not present

## 2023-02-11 LAB — COMPREHENSIVE METABOLIC PANEL
ALT: 399 U/L — ABNORMAL HIGH (ref 0–44)
AST: 710 U/L — ABNORMAL HIGH (ref 15–41)
Albumin: 1.8 g/dL — ABNORMAL LOW (ref 3.5–5.0)
Alkaline Phosphatase: 58 U/L (ref 38–126)
Anion gap: 16 — ABNORMAL HIGH (ref 5–15)
BUN: 57 mg/dL — ABNORMAL HIGH (ref 8–23)
CO2: 21 mmol/L — ABNORMAL LOW (ref 22–32)
Calcium: 7.4 mg/dL — ABNORMAL LOW (ref 8.9–10.3)
Chloride: 94 mmol/L — ABNORMAL LOW (ref 98–111)
Creatinine, Ser: 6.61 mg/dL — ABNORMAL HIGH (ref 0.61–1.24)
GFR, Estimated: 9 mL/min — ABNORMAL LOW (ref >60)
Glucose, Bld: 122 mg/dL — ABNORMAL HIGH (ref 70–99)
Potassium: 4.7 mmol/L (ref 3.5–5.1)
Sodium: 131 mmol/L — ABNORMAL LOW (ref 135–145)
Total Bilirubin: 0.8 mg/dL (ref 0.3–1.2)
Total Protein: 6 g/dL — ABNORMAL LOW (ref 6.5–8.1)

## 2023-02-11 LAB — CBC
HCT: 23.2 % — ABNORMAL LOW (ref 39.0–52.0)
Hemoglobin: 7.3 g/dL — ABNORMAL LOW (ref 13.0–17.0)
MCH: 27.9 pg (ref 26.0–34.0)
MCHC: 31.5 g/dL (ref 30.0–36.0)
MCV: 88.5 fL (ref 80.0–100.0)
Platelets: 395 10*3/uL (ref 150–400)
RBC: 2.62 MIL/uL — ABNORMAL LOW (ref 4.22–5.81)
RDW: 20.6 % — ABNORMAL HIGH (ref 11.5–15.5)
WBC: 25.8 10*3/uL — ABNORMAL HIGH (ref 4.0–10.5)
nRBC: 0 % (ref 0.0–0.2)

## 2023-02-11 LAB — GLUCOSE, CAPILLARY
Glucose-Capillary: 142 mg/dL — ABNORMAL HIGH (ref 70–99)
Glucose-Capillary: 138 mg/dL — ABNORMAL HIGH (ref 70–99)
Glucose-Capillary: 135 mg/dL — ABNORMAL HIGH (ref 70–99)
Glucose-Capillary: 133 mg/dL — ABNORMAL HIGH (ref 70–99)

## 2023-02-11 MED ORDER — HYDROXYZINE HCL 25 MG PO TABS: 25.0000 mg | ORAL_TABLET | Freq: Three times a day (TID) | ORAL | Status: DC | PRN

## 2023-02-11 MED ORDER — RISAQUAD PO CAPS
2.0000 | ORAL_CAPSULE | Freq: Every day | ORAL | Status: DC
  Administered 2023-02-12: 2 via ORAL
  Filled 2023-02-11 (×2): qty 2

## 2023-02-11 NOTE — Plan of Care (Signed)
Pt refused to have his foley removed despite providing education.  Problem: Education: Goal: Knowledge of General Education information will improve Description: Including pain rating scale, medication(s)/side effects and non-pharmacologic comfort measures Outcome: Progressing   Problem: Health Behavior/Discharge Planning: Goal: Ability to manage health-related needs will improve Outcome: Progressing   Problem: Activity: Goal: Risk for activity intolerance will decrease Outcome: Not Progressing   Problem: Coping: Goal: Level of anxiety will decrease Outcome: Progressing   Problem: Pain Managment: Goal: General experience of comfort will improve Outcome: Progressing

## 2023-02-11 NOTE — Progress Notes (Signed)
Occupational Therapy Treatment Patient Details Name: [name] [name] MRN: [id number] DOB: 1955-12-07 Today's Date: 02/11/2023   History of present illness 67 y.o. male with recent left BKA on 8/5, discharged home with wound vac on 8/8 who presented to the ED on 02/05/2023 with redness, and painful swelling at the stump site. Left BKA revision completed on 8/20. PMHx:AKI with ESRD and HD, CHF, CAD s/p CABG x3,  DM, diabetic neuropathy, OSA,   OT comments  Pt in bed upon therapy arrival. Left BKA revision completed yesterday on 8/20 although pt stated that it was completed Monday. Pt adamant during session that he will be going home today. He was not receptive to education from therapist's or case management. Pt demonstrates poor insight into deficits. Therapist attempted to educate and problem solve going home with pt although he was not able to carry on that conversation during session. Due to decreased activity tolerance and endurance including poor core strength and SOB, pt was unable to tolerate sitting EOB during session.  Required rest breaks during bed mobility when transitioning from supine to sit. Max Assist required to maintain sitting balance. Pt requested to lay back down and was unable to attempt transfer into WC using slideboard. This therapist continues to recommend continued inpatient follow up therapy, <3 hours/day d/t increased level of care that pt requires at this time. If pt returns home at this level of care, a hoyer lift will be necessary to allow family to complete transfers. OT will continue to follow patient acutely and work with him if he is receptive to therapy education and participation.        If plan is discharge home, recommend the following:  Two people to help with walking and/or transfers;Two people to help with bathing/dressing/bathroom;Assistance with cooking/housework;Assist for transportation;Help with stairs or ramp for entrance;Other (comment);Direct  supervision/assist for medications management;Direct supervision/assist for financial management;Supervision due to cognitive status (hoyer lift for transfers)   Information systems manager lift       Precautions / Restrictions Precautions Precautions: Fall Restrictions Weight Bearing Restrictions: Yes LLE Weight Bearing: Non weight bearing       Mobility Bed Mobility Overal bed mobility: Needs Assistance Bed Mobility: Supine to Sit, Sit to Supine     Supine to sit: Total assist, +2 for physical assistance, Used rails, HOB elevated Sit to supine: Total assist, +2 for physical assistance, +2 for safety/equipment, Used rails   General bed mobility comments: Pt able to initiate RLE movement towards EOB although when VC provided to try and move LLE, he immediately says, " I can't move it," without trying to help. HOB elevated to highest level and bed pad used to swivel hips and bring pt to sitting EOB. Increased time required to complete task d/t poor activity tolerance, endurance, core strength, and UB strength. Bed pads used to complete helicopter method to return to supine from sitting    Transfers      General transfer comment: Unable to attempt transfer. While seated EOB, pt required max physical assist to maintain sitting balance d/t posterior lean. Pt demonstrated SOB and decreased endurance. Pt terminated transfer attempt and requested to lay back down in bed.     Balance Overall balance assessment: Needs assistance Sitting-balance support: Bilateral upper extremity supported, Feet supported Sitting balance-Leahy Scale: Zero Sitting balance - Comments: sitting EOB Postural control: Posterior lean        ADL either performed or assessed with clinical judgement      Cognition Arousal: Alert Behavior  During Therapy: Flat affect (slightly agitated when talking about discharge plan with care management and ex wife on phone) Overall Cognitive Status: No  family/caregiver present to determine baseline cognitive functioning Area of Impairment: Memory, Safety/judgement, Awareness, Problem solving          Memory: Decreased short-term memory (stated that his revision surgery was Monday versus yesterday which was 8/20.)   Safety/Judgement: Decreased awareness of deficits, Decreased awareness of safety Awareness: Intellectual Problem Solving: Requires verbal cues, Requires tactile cues General Comments: Demonstrates poor judgement and safety awareness. Pt stated that he was going home today although could not verbalize how he would safely manage at home with family. Pt was not receptive to education. When therapist provided education, pt did not acknowledge it or partake in conversation to problem solve how family would care for him. Pt continued to verbalize that he needed to get out of bed although was physically unable to attempt task with therapy.              General Comments HR at rest: 93-94 bpm. During activity, such as transitioning from supine to sitting EOB, HR increased to 113-114 bpm. wound vac attached to left LE residual limb.    Pertinent Vitals/ Pain       Pain Assessment Pain Assessment: Faces Faces Pain Scale: Hurts whole lot Pain Location: left LE Pain Descriptors / Indicators: Grimacing, Guarding, Sore Pain Intervention(s): Monitored during session, Repositioned         Frequency  Min 1X/week        Progress Toward Goals  OT Goals(current goals can now be found in the care plan section)  Progress towards OT goals: Not progressing toward goals - comment (Decreased sitting balance this date. Poor activity tolerance and endurance. pt self limiting during session)         Co-evaluation    PT/OT/SLP Co-Evaluation/Treatment: Yes Reason for Co-Treatment: Necessary to address cognition/behavior during functional activity;To address functional/ADL transfers;For patient/therapist safety   OT goals addressed  during session: ADL's and self-care;Proper use of Adaptive equipment and DME;Strengthening/ROM      AM-PAC OT "6 Clicks" Daily Activity     Outcome Measure   Help from another person eating meals?: None Help from another person taking care of personal grooming?: A Lot Help from another person toileting, which includes using toliet, bedpan, or urinal?: Total Help from another person bathing (including washing, rinsing, drying)?: Total   Help from another person to put on and taking off regular lower body clothing?: Total 6 Click Score: 9    End of Session    OT Visit Diagnosis: Pain;Muscle weakness (generalized) (M62.81) Pain - Right/Left: Left Pain - part of body: Leg   Activity Tolerance Patient limited by fatigue;Patient limited by pain   Patient Left in bed;with call bell/phone within reach;with bed alarm set           Time: 1019-1050 OT Time Calculation (min): 31 min  Charges: OT General Charges $OT Visit: 1 Visit OT Treatments $Therapeutic Activity: 8-22 mins  Limmie Patricia, OTR/L,CBIS  Supplemental OT - [facility] and [facility] Secure Chat Preferred    Harjot Dibello, Charisse March 02/11/2023, 1:16 PM

## 2023-02-11 NOTE — Progress Notes (Signed)
[facility] Progress Note   Subjective:   Seen in room. He admits straight off that he is in a bad mood today, has already told several docs that he wants to go home today despite encouragement to stay. I spoke to him, again, that I think he needs 3d/week dialysis - would recommend a treatment today -- he is refusing. He does have capacity to refuse.  Objective Vitals:   02/10/23 2117 02/11/23 0500 02/11/23 0511 02/11/23 0746  BP: (!) 95/48  (!) 101/50 (!) 101/39  Pulse:   99 88  Resp:   18 17  Temp:   98.4 F (36.9 C) 98.7 F (37.1 C)  TempSrc:   Oral Oral  SpO2:   97% 97%  Weight:  112.8 kg    Height:       Physical Exam General: Chronically ill appearing man, NAD. room air. Heart: RRR; no murmur  Lungs: CTA anteriorly Abdomen: obese, non-tender, distended Extremities: Trace BLE edema, L BKA bandaged with wound vac in place Dialysis Access:  AVF + bruit  Additional Objective Labs: Basic Metabolic Panel: Recent Labs  Lab 02/07/23 0207 02/08/23 0428 02/09/23 0327 02/10/23 0953 02/11/23 0321  NA 134* 134* 132* 131* 131*  K 3.5 3.5 3.1* 3.9 4.7  CL 96* 96* 96* 94* 94*  CO2 26 24 21*  --  21*  GLUCOSE 126* 112* 118* 130* 122*  BUN 43* 56* 68* 52* 57*  CREATININE 4.28* 5.38* 6.22* 5.70* 6.61*  CALCIUM 7.4* 7.6* 7.4*  --  7.4*  PHOS 4.3 6.7* 8.1*  --   --    Liver Function Tests: Recent Labs  Lab 02/05/23 1408 02/06/23 0733 02/07/23 0207 02/08/23 0428 02/09/23 0327 02/11/23 0321  AST 326* 398*  --   --   --  710*  ALT 119* 131*  --   --   --  399*  ALKPHOS 98 82  --   --   --  58  BILITOT 1.0 1.2  --   --   --  0.8  PROT 6.3* 5.9*  --   --   --  6.0*  ALBUMIN 2.2* 1.9*   < > 1.9* 1.8* 1.8*   < > = values in this interval not displayed.   CBC: Recent Labs  Lab 02/05/23 1408 02/07/23 0207 02/08/23 0428 02/09/23 0327 02/10/23 0056 02/10/23 0953 02/11/23 0321  WBC 23.7* 21.2* 20.8* 24.2* 25.3*  --  25.8*  NEUTROABS 20.0*  --   --   --    --   --   --   HGB 7.2* 6.5* 7.7* 7.4* 7.7* 8.2* 7.3*  HCT 23.3* 20.9* 24.2* 24.0* 24.2* 24.0* 23.2*  MCV 86.9 85.0 85.5 85.4 86.1  --  88.5  PLT 492* 405* 423* 422* 448*  --  395   Blood Culture    Component Value Date/Time   SDES WOUND 02/10/2023 1201   SPECREQUEST L BKA REVISION 02/10/2023 1201   CULT PENDING 02/10/2023 1201   REPTSTATUS PENDING 02/10/2023 1201   Studies/Results: US Abdomen Limited RUQ (LIVER/GB)  Result Date: 02/11/2023 CLINICAL DATA:  Abdominal tenderness EXAM: ULTRASOUND ABDOMEN LIMITED RIGHT UPPER QUADRANT COMPARISON:  CT 09/10/2022 FINDINGS: Gallbladder: No gallstones or wall thickening visualized. No sonographic Murphy sign noted by sonographer. Common bile duct: Diameter: 3 mm.  No intrahepatic biliary dilation. Liver: No focal lesion identified. Within normal limits in parenchymal echogenicity. Portal vein is patent on color Doppler imaging with normal direction of blood flow towards the liver. Other:  None. IMPRESSION: No sonographic correlate for pain. Electronically Signed   By: Minerva Fester M.D.   On: 02/11/2023 00:11   PERIPHERAL VASCULAR CATHETERIZATION  Result Date: 02/10/2023 See surgical note for result.  Medications:  piperacillin-tazobactam (ZOSYN)  IV 2.25 g (02/11/23 0512)   vancomycin Stopped (02/09/23 1304)    (feeding supplement) PROSource Plus  30 mL Oral BID BM   amiodarone  200 mg Oral BID   atorvastatin  80 mg Oral Daily   Chlorhexidine Gluconate Cloth  6 each Topical Q0600   darbepoetin (ARANESP) injection - DIALYSIS  200 mcg Subcutaneous Q Fri-1800   insulin aspart  0-9 Units Subcutaneous TID WC   insulin glargine-yfgn  10 Units Subcutaneous BID   midodrine  5 mg Oral Daily   sevelamer carbonate  1,600 mg Oral TID WC   torsemide  40 mg Oral Daily    Dialysis Orders: [facility] MF 3:30 400/600 EDW 113.6 kg 3K/2.5 Ca AVF No heparin Mircera 200 q 2 wks to start 8/16      Assessment/Plan: L BKA wound infection: S/p L BKA 8/5.  Wound poorly healing, concern for infection and with necrotic appearing area. On Vanc/Zosyn. Vascular surgery following - s/p excisional debridement with wound vac placement 8/20. WBC remains very high. ESRD: Only Monday/Friday HD as outpatient - historically with better BUN/Cr - given acute issues, would benefit from more HD - rec 3d/week HD - d/w him - refusing HD today. Hx urinary retention. Foley in place; urine remains bloody - per primary. Hypotension/volume: BP low side, on mido 5mg  daily.  Anemia of ESRD: Hgb 7.3. Aranesp q Friday, s/p 1U PRBCs on 8/17.   Metabolic bone disease: CorrCa ok, Phos high - doesn't take binders as OP - usually better, started Renvela 2/meals here. HFpEF - Continue torsemide 40 mg  daily CAD - Hx CABG 10/2021 T2DM - Insulin per primary  Afib - Eliquis on hold. On amiodarone/metoprolol.  Nutrition: Alb low, continue supplements. Transaminitis: AST/ALT very high, Bili ok - abd [place] without clear liver issue. ?med effect ?hepatic congestion  Ozzie Hoyle, PA-C 02/11/2023, 11:00 AM  [employer]

## 2023-02-11 NOTE — TOC Initial Note (Addendum)
Transition of Care [facility]) - Initial/Assessment Note    Patient Details  Name: [name] [name] MRN: [id number] Date of Birth: 04-02-1956  Transition of Care [facility]) CM/SW Contact:    Epifanio Lesches, RN Phone Number: 02/11/2023, 10:21 AM  Clinical Narrative:                 Readmitted with nonhealing left below the knee amputation.       -s/p Evacuation of hematoma left below the knee amputation, revision of L BKA and  placement of VAC, 8/20  From home with ex-wife and 2 sons. PTA active with [facility], RN,PT,OT. Pt with current recommendations from PT for SNF placement rehab.  Pt adamantly declined. Pt states will d/c to home and plans to go home today! Ex wife concern regarding medical readiness and have shared with pt. Pt states family will care for him. Sons work during the day, ex wife will provided the supervision needed.  Pt states ok with the resumption of services from [facility]. Per MD pt requiring HD today, pt refusing... Pt will need home DME wound vac.  insurance not in network with [employer]. Referral made with [facility]/ [employer], approval pending. However, per vascular pt can d/c with a Prevena vac until approval received with regular vac.  TOC team following and will assist with needs...  02/11/2023 @ 1300  Order noted for hoyerlift . Referral made with [facility] @ [employer]. Equipment will be delivered to pt's home.  02/11/2023 @1530  Provider completed forms for home wound vac/[employer]. Forms and order scanned and emailed to Mitch/ [employer] for processing, approval pending.  Expected Discharge Plan: Home w Home Health Services (Declined SNF placement) Barriers to Discharge: Continued Medical Work up   Patient Goals and CMS Choice     Choice offered to / list presented to : Patient      Expected Discharge Plan and Services   Discharge Planning Services: CM Consult                       DME Agency: [employer] Date DME Agency Contacted: 02/11/23 Time DME Agency  Contacted: 1017 Representative spoke with at DME Agency: Mitch HH Arranged: RN, PT, OT HH Agency: [facility] Date [facility] Agency Contacted: 02/11/23 Time HH Agency Contacted: 1014 Representative spoke with at [facility] Agency: Kandee Keen  Prior Living Arrangements/Services   Lives with:: Spouse Patient language and need for interpreter reviewed:: Yes Do you feel safe going back to the place where you live?: Yes      Need for Family Participation in Patient Care: Yes (Comment) Care giver support system in place?: Yes (comment)   Criminal Activity/Legal Involvement Pertinent to Current Situation/Hospitalization: No - Comment as needed  Activities of Daily Living Home Assistive Devices/Equipment: Wheelchair, Environmental consultant (specify type), Bedside commode/3-in-1 ADL Screening (condition at time of admission) Patient's cognitive ability adequate to safely complete daily activities?: Yes Is the patient deaf or have difficulty hearing?: No Does the patient have difficulty seeing, even when wearing glasses/contacts?: No Does the patient have difficulty concentrating, remembering, or making decisions?: No Patient able to express need for assistance with ADLs?: Yes Does the patient have difficulty dressing or bathing?: No Independently performs ADLs?: Yes (appropriate for developmental age) Does the patient have difficulty walking or climbing stairs?: Yes Weakness of Legs: Right Weakness of Arms/Hands: None  Permission Sought/Granted   Permission granted to share information with : Yes, Verbal Permission Granted  Share Information with NAME: Linus Comito  Relative  [phone]           Emotional Assessment Appearance:: Appears stated age Attitude/Demeanor/Rapport: Engaged Affect (typically observed): Accepting Orientation: : Oriented to Self, Oriented to  Time, Oriented to Place, Oriented to Situation Alcohol / Substance Use: Not Applicable Psych Involvement: No (comment)  Admission  diagnosis:  Amputation stump infection (HCC) [T87.40] Patient Active Problem List   Diagnosis Date Noted   Amputation stump infection (HCC) 02/05/2023   Limb ischemia 01/26/2023   PAD (peripheral artery disease) (HCC) 01/26/2023   S/P BKA (below knee amputation), left (HCC) 01/26/2023   Obstructive airway disease (HCC) 01/26/2023   End stage renal disease (HCC) 03/24/2022   Coronary artery disease 01/17/2022   Allergy, unspecified, initial encounter 12/05/2021   Morbid (severe) obesity due to excess calories (HCC) 12/05/2021   Anemia in chronic kidney disease 12/03/2021   Cardiomyopathy, unspecified (HCC) 12/03/2021   Dependence on renal dialysis (HCC) 12/03/2021   Personal history of nicotine dependence 12/03/2021   Long term (current) use of anticoagulants 12/03/2021   Type 2 diabetes mellitus with diabetic neuropathy, unspecified (HCC) 12/03/2021   S/P CABG x 3 11/12/2021   Chronic diastolic CHF (congestive heart failure) (HCC) 07/15/2021   Dysrhythmia 07/15/2021   Erectile dysfunction 07/15/2021   Back pain due to inflammatory process 07/15/2021   OSA (obstructive sleep apnea) 07/15/2021   Testicular hypofunction 07/15/2021   High risk medication use 05/26/2018   Chronic anticoagulation 02/25/2018   LV dysfunction 04/28/2017   Sinus node dysfunction (HCC) 04/21/2016   Bradycardia 01/25/2015   Benign essential HTN 07/30/2014   Diabetic neuropathy (HCC) 07/28/2014   Mixed hyperlipidemia 07/28/2014   Paroxysmal atrial fibrillation (HCC) 07/28/2014   PCP:  [name], PA-C Pharmacy:   [facility] - [place], [place] - [address] [place] [address] Phone: [phone] Fax: [phone]  [facility] [address] [place], [place] - [address] [place] [address] Phone: [phone] Fax: [phone]     Social Determinants of Health (SDOH) Social History: SDOH Screenings   Food  Insecurity: No Food Insecurity (02/05/2023)  Housing: Low Risk  (02/05/2023)  Transportation Needs: No Transportation Needs (02/05/2023)  Utilities: Not At Risk (02/05/2023)  Tobacco Use: Medium Risk (02/10/2023)   SDOH Interventions:     Readmission Risk Interventions    01/29/2023   11:34 AM  Readmission Risk Prevention Plan  Transportation Screening Complete  PCP or Specialist Appt within 5-7 Days Complete  Home Care Screening Complete  Medication Review (RN CM) Complete

## 2023-02-11 NOTE — Progress Notes (Signed)
Came by to see patient as he is wanting to leave AMA.  Long discussion with patient and caregiver regarding his elevated WBC, need for wound vac, elevated LFTs but patient adamant about leaving,  Has the capacity to make this decision.  Will work with TOC to arrange a safe D/C plan.  Home health ordered. JV

## 2023-02-11 NOTE — Progress Notes (Signed)
Physical Therapy Treatment Patient Details Name: [name] [name] MRN: [id number] DOB: 03/26/1956 Today's Date: 02/11/2023   History of Present Illness 67 y.o. male with recent left BKA on 8/5, discharged home with wound vac on 8/8 who presented to the ED on 02/05/2023 with redness, and painful swelling at the stump site. Left BKA revision completed on 8/20. PMHx:AKI with ESRD and HD, CHF, CAD s/p CABG x3,  DM, diabetic neuropathy, OSA,    PT Comments  Pt upset that wheelchair was not available to him throughout is stay to be able to transfer to toilet. Focus to work on transfer to and from wheelchair with sliding board which is his usual transfer technique at home. Pt also upset that Nephrology has recommended a MWF dialysis schedule from his usual MF. Pt has increased work of breathing with mobilization. PT attempted to provide education about his breathing and need for additional dialysis. Pt refuses all teaching. RNCM in room to discuss wound vac needed for home use and ex-wife's concern with him coming home and not being able to mobilize on his own. Pt rude to wife and CM. Refocused on transfer. Pt establishes that he sleeps in a recliner at home. With head of bed fully elevated to approximate home situation pt requires total Ax2 for coming to EoB. While sitting EoB pt with decreased balance and quickly fatigues requiring total Ax2 for return to bed. Pt is adamant about return home, if so he will need hoyer lift in order to transfer to his wheelchair to transport to HD.    If plan is discharge home, recommend the following: Two people to help with walking and/or transfers;Two people to help with bathing/dressing/bathroom;Assistance with cooking/housework;Direct supervision/assist for medications management;Direct supervision/assist for financial management;Assist for transportation;Help with stairs or ramp for entrance   Can travel by private vehicle     No  Equipment Recommendations  None  recommended by PT       Precautions / Restrictions Precautions Precautions: Fall Restrictions Weight Bearing Restrictions: Yes LLE Weight Bearing: Non weight bearing     Mobility  Bed Mobility Overal bed mobility: Needs Assistance Bed Mobility: Supine to Sit, Sit to Supine     Supine to sit: Total assist, +2 for physical assistance, Used rails, HOB elevated Sit to supine: Total assist, +2 for physical assistance, +2 for safety/equipment, Used rails   General bed mobility comments: Pt able to initiate RLE movement towards EOB although when VC provided to try and move LLE, he immediately says, " I can't move it," without trying to help. HOB elevated to highest level and bed pad used to swivel hips and bring pt to sitting EOB. Increased time required to complete task d/t poor activity tolerance, endurance, core strength, and UB strength. Bed pads used to complete helicopter method to return to supine from sitting    Transfers                   General transfer comment: Unable to attempt transfer. While seated EOB, pt required max physical assist to maintain sitting balance d/t posterior lean. Pt demonstrated SOB and decreased endurance. Pt terminated transfer attempt and requested to lay back down in bed.          Balance Overall balance assessment: Needs assistance Sitting-balance support: Bilateral upper extremity supported, Feet supported Sitting balance-Leahy Scale: Zero Sitting balance - Comments: sitting EOB Postural control: Posterior lean  Cognition Arousal: Alert Behavior During Therapy: Flat affect (slightly agitated when talking about discharge plan with care management and ex wife on phone) Overall Cognitive Status: No family/caregiver present to determine baseline cognitive functioning Area of Impairment: Memory, Safety/judgement, Awareness, Problem solving                     Memory: Decreased  short-term memory (stated that his revision surgery was Monday versus yesterday which was 8/20.)   Safety/Judgement: Decreased awareness of deficits, Decreased awareness of safety Awareness: Intellectual Problem Solving: Requires verbal cues, Requires tactile cues General Comments: Demonstrates poor judgement and safety awareness. Pt stated that he was going home today although could not verbalize how he would safely manage at home with family. Pt was not receptive to education. When therapist provided education, pt did not acknowledge it or partake in conversation to problem solve how family would care for him. Pt continued to verbalize that he needed to get out of bed although was physically unable to attempt task with therapy.           General Comments General comments (skin integrity, edema, etc.): Pt with increased work of breathing with all movement      Pertinent Vitals/Pain Pain Assessment Pain Assessment: Faces Faces Pain Scale: Hurts whole lot Pain Location: left LE Pain Descriptors / Indicators: Grimacing, Guarding, Sore Pain Intervention(s): Limited activity within patient's tolerance, Monitored during session, Repositioned     PT Goals (current goals can now be found in the care plan section) Acute Rehab PT Goals PT Goal Formulation: With patient Time For Goal Achievement: 02/24/23 Potential to Achieve Goals: Fair Progress towards PT goals: Not progressing toward goals - comment    Frequency    Min 1X/week           Co-evaluation PT/OT/SLP Co-Evaluation/Treatment: Yes Reason for Co-Treatment: Necessary to address cognition/behavior during functional activity;To address functional/ADL transfers;For patient/therapist safety PT goals addressed during session: Mobility/safety with mobility;Balance OT goals addressed during session: ADL's and self-care;Proper use of [employer] equipment and DME;Strengthening/ROM      AM-PAC PT "6 Clicks" Mobility   Outcome  Measure  Help needed turning from your back to your side while in a flat bed without using bedrails?: A Lot Help needed moving from lying on your back to sitting on the side of a flat bed without using bedrails?: Total Help needed moving to and from a bed to a chair (including a wheelchair)?: Total Help needed standing up from a chair using your arms (e.g., wheelchair or bedside chair)?: Total Help needed to walk in hospital room?: Total Help needed climbing 3-5 steps with a railing? : Total 6 Click Score: 7    End of Session   Activity Tolerance: Patient limited by pain;Patient limited by fatigue Patient left: in bed;with call bell/phone within reach;with bed alarm set Nurse Communication: Mobility status;Other (comment) (decreased ability to transfer) PT Visit Diagnosis: Other abnormalities of gait and mobility (R26.89);Muscle weakness (generalized) (M62.81);Pain Pain - Right/Left: Left Pain - part of body: Leg     Time: 1610-9604 PT Time Calculation (min) (ACUTE ONLY): 40 min  Charges:    $Therapeutic Activity: 8-22 mins PT General Charges $$ ACUTE PT VISIT: 1 Visit                     Keiry Kowal B. Beverely Risen PT, DPT Acute Rehabilitation Services Please use secure chat or  Call Office [phone]     Elon Alas [facility] 02/11/2023,  3:34 PM

## 2023-02-11 NOTE — Progress Notes (Signed)
Pt. Refused Per. 5N Bedside RN

## 2023-02-11 NOTE — Progress Notes (Addendum)
  Progress Note    02/11/2023 8:19 AM 1 Day Post-Op  Subjective:  wants to go home   Vitals:   02/11/23 0511 02/11/23 0746  BP: (!) 101/50 (!) 101/39  Pulse: 99 88  Resp: 18 17  Temp: 98.4 F (36.9 C) 98.7 F (37.1 C)  SpO2: 97% 97%   Physical Exam: Lungs:  non labored Incisions:  L BKA vac left in place; serosanguinous drainage in cannister Neurologic: A&O  CBC    Component Value Date/Time   WBC 25.8 (H) 02/11/2023 0321   RBC 2.62 (L) 02/11/2023 0321   HGB 7.3 (L) 02/11/2023 0321   HGB 7.6 (L) 04/17/2022 1452   HCT 23.2 (L) 02/11/2023 0321   HCT 25.3 (L) 04/17/2022 1452   PLT 395 02/11/2023 0321   PLT 351 04/17/2022 1452   MCV 88.5 02/11/2023 0321   MCV 88 04/17/2022 1452   MCH 27.9 02/11/2023 0321   MCHC 31.5 02/11/2023 0321   RDW 20.6 (H) 02/11/2023 0321   RDW 15.5 (H) 04/17/2022 1452   LYMPHSABS 1.0 02/05/2023 1408   LYMPHSABS 1.4 02/18/2022 1117   MONOABS 2.0 (H) 02/05/2023 1408   EOSABS 0.2 02/05/2023 1408   EOSABS 0.3 02/18/2022 1117   BASOSABS 0.1 02/05/2023 1408   BASOSABS 0.1 02/18/2022 1117    BMET    Component Value Date/Time   NA 131 (L) 02/11/2023 0321   NA 139 04/17/2022 1452   K 4.7 02/11/2023 0321   CL 94 (L) 02/11/2023 0321   CO2 21 (L) 02/11/2023 0321   GLUCOSE 122 (H) 02/11/2023 0321   BUN 57 (H) 02/11/2023 0321   BUN 42 (H) 04/17/2022 1452   CREATININE 6.61 (H) 02/11/2023 0321   CALCIUM 7.4 (L) 02/11/2023 0321   GFRNONAA 9 (L) 02/11/2023 0321   GFRAA 42 (L) 10/18/2019 1006    INR    Component Value Date/Time   INR 1.8 (H) 01/26/2023 1513     Intake/Output Summary (Last 24 hours) at 02/11/2023 0819 Last data filed at 02/11/2023 0516 Gross per 24 hour  Intake 900 ml  Output 225 ml  Net 675 ml     Assessment/Plan:  67 y.o. male is s/p L BKA revision 1 Day Post-Op   L BKA vac will be removed in 1 week; he will need HH RN for 2-3x/week vac changes thereafter [facility] for discharge when [facility] and home vac arranged.  Can  de-escalate antibiotics at time of discharge   Emilie Rutter, PA-C [facility] [phone] 02/11/2023 8:19 AM  VASCULAR STAFF ADDENDUM: I agree with the above.   Rande Brunt. Lenell Antu, MD [facility] Phone Number: [phone] 02/11/2023 4:32 PM

## 2023-02-11 NOTE — Progress Notes (Addendum)
[facility] PROGRESS NOTE  [name] [name] (DOB: [birth year]) ZOX:[id number] PCP: [name], PA-C  Brief Narrative: [name] [name] is a 67 y.o. male with a history of PAD, T2DM, HTN, HLD, AFib, CAD s/p CABG, OSA, ESRD on HD M&F, CHF, and left BKA on 8/5, discharged home with wound vac on 8/8 who presented to the ED on 02/05/2023 with redness, and painful swelling at the stump site. He was admitted, placed on IV antibiotics, for stump site infection with vascular surgery consulting. Eliquis has been held in preparation for possible surgery. I&D planned 8/20 per vascular surgery. PT/OT consulted. Routine HD ongoing.  Patient refusing SNF.  Wanting to leave AMA-- trying to make a safe plan- including DME, wound vac, home health.   Subjective: Wants to go home- knows risk of leaving-- including death and re-hospitalization   Objective: BP (!) 101/39 (BP Location: Left Arm)   Pulse 88   Temp 98.7 F (37.1 C) (Oral)   Resp 17   Ht 5\' 10"  (1.778 m)   Wt 112.8 kg   SpO2 97%   BMI 35.68 kg/m    General: Appearance:    Obese male chronically ill appearing     Lungs:      respirations unlabored  Heart:    Normal heart rate. .      Neurologic:   Awake, alert- irritable     Assessment & Plan: Principal Problem:   Amputation stump infection (HCC) Active Problems:   LV dysfunction   Paroxysmal atrial fibrillation (HCC)   Chronic anticoagulation   Benign essential HTN   Dysrhythmia   OSA (obstructive sleep apnea)   S/P CABG x 3   Coronary artery disease   End stage renal disease (HCC)   S/P BKA (below knee amputation), left (HCC)   PVD s/p BKA 8/5 with stump site infection:  - Continue vancomycin, zosyn. WBC elevated despite several days of IV antibiotics- per vascular can de-escalate - Vascular surgery following > I&D 8/20. At very high risk of AKA.  - Holding DOAC.    Anemia of CKD:  - Hgb down, got transfusion 8/17, hgb up as anticipated, continue monitoring. Stopping  anticoagulation. ESA qFriday per nephrology.   Nausea, vomiting, liquid stools: Improving.  LFTs elevated - Probiotics, antiemetics, taper opioids as able - Abd exam slightly distended but nontender 8/20. Got a dose of imodium which we'll stop. Remains afebrile.  - RUQ U/S un-revealing -trend lfts  ESRD: s/p AKI during CABG admission, still makes urine. - Continue HD: appreciate assistance by nephrology. Continue midodrine. -refusing HD today   CAD: s/p CABG, HLD:  - Hypotension limiting metoprolol. Continue statin   Chronic HFpEF:  - Continue home demadex and dialysis, appears euvolemic    PAF: Rate controlled. - Continue amiodarone. Holding metoprolol with soft BPs.  - Stopped IV heparin with ongoing bleeding. Pt aware of risk of stroke and agrees with holding anticoagulation at this time. Holding eliquis   Acute urinary retention: Occurred at [facility] prior to admission here.  - refusing to have foley removed now-- will allow removal prior to d/c  IDT2DM: Recent HbA1c 5.9%.  - Glargine 10u BID, SSI. May need to deescalate home regimen.    COPD:  - Continue controller BD, prn  OSA:  - CPAP qHS  Joseph Art, DO [facility] [email or web address] 02/11/2023, 1:00 PM

## 2023-02-11 NOTE — Care Management Important Message (Signed)
Important Message  Patient Details  Name: [name] [name] MRN: [id number] Date of Birth: 06-Jul-1955   Medicare Important Message Given:  Yes     Sherilyn Banker 02/11/2023, 1:47 PM

## 2023-02-11 NOTE — Progress Notes (Addendum)
**DELAYED CHARTING - PT SEEN 02/10/23 at 4pm** [facility] Progress Note   Subjective:   Seen in PACU s/p amp revision - sleepy.  Objective Vitals:   02/10/23 2117 02/11/23 0500 02/11/23 0511 02/11/23 0746  BP: (!) 95/48  (!) 101/50 (!) 101/39  Pulse:   99 88  Resp:   18 17  Temp:   98.4 F (36.9 C) 98.7 F (37.1 C)  TempSrc:   Oral   SpO2:   97% 97%  Weight:  112.8 kg    Height:       Physical Exam General: Overweight man, NAD. Nasal O2 Heart: RRR; no murmur Lungs: CTA anteriorly Abdomen: obese, non-tender Extremities: Trace RLE edema; L BKA bandaged with wound vac Dialysis Access: AVF + bruit  Additional Objective Labs: Basic Metabolic Panel: Recent Labs  Lab 02/07/23 0207 02/08/23 0428 02/09/23 0327 02/10/23 0953 02/11/23 0321  NA 134* 134* 132* 131* 131*  K 3.5 3.5 3.1* 3.9 4.7  CL 96* 96* 96* 94* 94*  CO2 26 24 21*  --  21*  GLUCOSE 126* 112* 118* 130* 122*  BUN 43* 56* 68* 52* 57*  CREATININE 4.28* 5.38* 6.22* 5.70* 6.61*  CALCIUM 7.4* 7.6* 7.4*  --  7.4*  PHOS 4.3 6.7* 8.1*  --   --    Liver Function Tests: Recent Labs  Lab 02/05/23 1408 02/06/23 0733 02/07/23 0207 02/08/23 0428 02/09/23 0327 02/11/23 0321  AST 326* 398*  --   --   --  710*  ALT 119* 131*  --   --   --  399*  ALKPHOS 98 82  --   --   --  58  BILITOT 1.0 1.2  --   --   --  0.8  PROT 6.3* 5.9*  --   --   --  6.0*  ALBUMIN 2.2* 1.9*   < > 1.9* 1.8* 1.8*   < > = values in this interval not displayed.    CBC: Recent Labs  Lab 02/05/23 1408 02/07/23 0207 02/08/23 0428 02/09/23 0327 02/10/23 0056 02/10/23 0953 02/11/23 0321  WBC 23.7* 21.2* 20.8* 24.2* 25.3*  --  25.8*  NEUTROABS 20.0*  --   --   --   --   --   --   HGB 7.2* 6.5* 7.7* 7.4* 7.7* 8.2* 7.3*  HCT 23.3* 20.9* 24.2* 24.0* 24.2* 24.0* 23.2*  MCV 86.9 85.0 85.5 85.4 86.1  --  88.5  PLT 492* 405* 423* 422* 448*  --  395   Blood Culture    Component Value Date/Time   SDES WOUND 02/10/2023 1201    SPECREQUEST L BKA REVISION 02/10/2023 1201   CULT PENDING 02/10/2023 1201   REPTSTATUS PENDING 02/10/2023 1201    Medications:  piperacillin-tazobactam (ZOSYN)  IV 2.25 g (02/11/23 0512)   vancomycin Stopped (02/09/23 1304)    (feeding supplement) PROSource Plus  30 mL Oral BID BM   amiodarone  200 mg Oral BID   atorvastatin  80 mg Oral Daily   Chlorhexidine Gluconate Cloth  6 each Topical Q0600   darbepoetin (ARANESP) injection - DIALYSIS  200 mcg Subcutaneous Q Fri-1800   insulin aspart  0-9 Units Subcutaneous TID WC   insulin glargine-yfgn  10 Units Subcutaneous BID   midodrine  5 mg Oral Daily   sevelamer carbonate  1,600 mg Oral TID WC   torsemide  40 mg Oral Daily    Dialysis Orders: [facility] MF 3:30 400/600 EDW 113.6 kg 3K/2.5 Ca AVF No  heparin Mircera 200 q 2 wks to start 8/16      Assessment/Plan: L BKA wound infection: S/p L BKA 8/5. Wound poorly healing, concern for infection and with necrotic appearing area. On Vanc/Zosyn. Vascular surgery following - s/p excisional debridement with wound vac placement 8/20. ESRD: Only Monday/Friday HD as outpatient - historically with better BUN/Cr - given acute issues, would benefit from more HD - rec 3d/week HD - d/w him - he doesn't like this idea. For HD Wed. Hx urinary retention. Foley in place; urine is bloody - per primary. Hypotension/volume: BP low side, on mido 5mg  daily - follow. Anemia of ESRD: Hgb 7.7. Aranesp q Friday, s/p 1U PRBCs on 8/17.   Metabolic bone disease: CorrCa ok, Phos high - doesn't take binders as OP - usually better, started Renvela 2/meals here. HFpEF - Continue torsemide 40 mg  daily T2DM - Insulin per primary  Afib - Eliquis on hold. On amiodarone/metoprolol. On heparin drip. Nutrition: Alb low, continue supplements.    Ozzie Hoyle, PA-C 02/11/2023, 8:00 AM  [facility]  Pt seen, examined and agree w A/P as above.  Vinson Moselle MD  [facility] 02/11/2023, 5:40 PM

## 2023-02-12 ENCOUNTER — Other Ambulatory Visit (HOSPITAL_COMMUNITY): Payer: Self-pay

## 2023-02-12 DIAGNOSIS — T8740 Infection of amputation stump, unspecified extremity: Secondary | ICD-10-CM | POA: Diagnosis not present

## 2023-02-12 LAB — COMPREHENSIVE METABOLIC PANEL
ALT: 442 U/L — ABNORMAL HIGH (ref 0–44)
AST: 547 U/L — ABNORMAL HIGH (ref 15–41)
Albumin: 1.9 g/dL — ABNORMAL LOW (ref 3.5–5.0)
Alkaline Phosphatase: 74 U/L (ref 38–126)
Anion gap: 20 — ABNORMAL HIGH (ref 5–15)
BUN: 77 mg/dL — ABNORMAL HIGH (ref 8–23)
CO2: 21 mmol/L — ABNORMAL LOW (ref 22–32)
Calcium: 7.6 mg/dL — ABNORMAL LOW (ref 8.9–10.3)
Chloride: 90 mmol/L — ABNORMAL LOW (ref 98–111)
Creatinine, Ser: 8.31 mg/dL — ABNORMAL HIGH (ref 0.61–1.24)
GFR, Estimated: 6 mL/min — ABNORMAL LOW (ref >60)
Glucose, Bld: 133 mg/dL — ABNORMAL HIGH (ref 70–99)
Potassium: 5.2 mmol/L — ABNORMAL HIGH (ref 3.5–5.1)
Sodium: 131 mmol/L — ABNORMAL LOW (ref 135–145)
Total Bilirubin: 1.1 mg/dL (ref 0.3–1.2)
Total Protein: 6.4 g/dL — ABNORMAL LOW (ref 6.5–8.1)

## 2023-02-12 LAB — CBC
HCT: 25.4 % — ABNORMAL LOW (ref 39.0–52.0)
Hemoglobin: 7.7 g/dL — ABNORMAL LOW (ref 13.0–17.0)
MCH: 26.8 pg (ref 26.0–34.0)
MCHC: 30.3 g/dL (ref 30.0–36.0)
MCV: 88.5 fL (ref 80.0–100.0)
Platelets: 535 10*3/uL — ABNORMAL HIGH (ref 150–400)
RBC: 2.87 MIL/uL — ABNORMAL LOW (ref 4.22–5.81)
RDW: 21.1 % — ABNORMAL HIGH (ref 11.5–15.5)
WBC: 24.9 10*3/uL — ABNORMAL HIGH (ref 4.0–10.5)
nRBC: 0 % (ref 0.0–0.2)

## 2023-02-12 LAB — GLUCOSE, CAPILLARY
Glucose-Capillary: 119 mg/dL — ABNORMAL HIGH (ref 70–99)
Glucose-Capillary: 130 mg/dL — ABNORMAL HIGH (ref 70–99)

## 2023-02-12 MED ORDER — INSULIN GLARGINE 100 UNIT/ML ~~LOC~~ SOLN: 20.0000 [IU] | Freq: Two times a day (BID) | SUBCUTANEOUS | Status: DC

## 2023-02-12 MED ORDER — DOXYCYCLINE HYCLATE 100 MG PO TABS
100.0000 mg | ORAL_TABLET | Freq: Two times a day (BID) | ORAL | 0 refills | Status: DC
  Filled 2023-02-12: qty 14, 7d supply, fill #0

## 2023-02-12 MED ORDER — TORSEMIDE 20 MG PO TABS
40.0000 mg | ORAL_TABLET | Freq: Every day | ORAL | 1 refills | Status: DC
  Filled 2023-02-12: qty 180, 90d supply, fill #0

## 2023-02-12 MED ORDER — CLOPIDOGREL BISULFATE 75 MG PO TABS: 75.0000 mg | ORAL_TABLET | Freq: Every day | ORAL | Status: DC

## 2023-02-12 MED ORDER — DOXYCYCLINE HYCLATE 100 MG PO TABS
100.0000 mg | ORAL_TABLET | Freq: Two times a day (BID) | ORAL | Status: DC
  Administered 2023-02-12: 100 mg via ORAL
  Filled 2023-02-12: qty 1

## 2023-02-12 MED ORDER — ACIDOPHILUS PO CAPS
2.0000 | ORAL_CAPSULE | Freq: Every day | ORAL | 0 refills | Status: DC
  Filled 2023-02-12: qty 100, 50d supply, fill #0

## 2023-02-12 MED ORDER — ATORVASTATIN CALCIUM 80 MG PO TABS: 80.0000 mg | ORAL_TABLET | Freq: Every day | ORAL | Status: DC

## 2023-02-12 MED ORDER — AMOXICILLIN-POT CLAVULANATE 500-125 MG PO TABS
1.0000 | ORAL_TABLET | Freq: Every day | ORAL | 0 refills | Status: DC
  Filled 2023-02-12: qty 7, 7d supply, fill #0

## 2023-02-12 MED ORDER — APIXABAN 5 MG PO TABS
5.0000 mg | ORAL_TABLET | Freq: Two times a day (BID) | ORAL | Status: DC
Start: 2023-02-18 — End: 2023-10-22

## 2023-02-12 MED ORDER — AMOXICILLIN-POT CLAVULANATE 500-125 MG PO TABS
1.0000 | ORAL_TABLET | Freq: Every day | ORAL | Status: DC
  Administered 2023-02-12: 1 via ORAL
  Filled 2023-02-12: qty 1

## 2023-02-12 NOTE — Plan of Care (Signed)
Pt continues to refuse foley removal despite providing education. Pt also had several loose BMs overnight, vomited once with bilious type emesis. Pt with distended abdomen, denies any pain.   Problem: Education: Goal: Knowledge of General Education information will improve Description: Including pain rating scale, medication(s)/side effects and non-pharmacologic comfort measures Outcome: Not Progressing   Problem: Health Behavior/Discharge Planning: Goal: Ability to manage health-related needs will improve Outcome: Not Progressing   Problem: Pain Managment: Goal: General experience of comfort will improve Outcome: Progressing   Problem: Safety: Goal: Ability to remain free from injury will improve Outcome: Progressing

## 2023-02-12 NOTE — TOC Progression Note (Signed)
Transition of Care [facility]) - Progression Note    Patient Details  Name: [name] [name] MRN: [id number] Date of Birth: 19-Aug-1955  Transition of Care [facility]) CM/SW Contact  Lorri Frederick, LCSW Phone Number: 02/12/2023, 1:34 PM  Clinical Narrative:  At [facility] request, CSW spoke with pt and Liborio Nixon in room regarding plan to DC home.  Pt confirms this plan.  We discussed need to be able to get from home to HD by personal vehicle.  Pt reports that he is indeed able to transfer from his chair to his wheelchair to the car with his sliding board.  Pt states staff here at the hospital "didn't know what they are doing" which prevented him from being able to demonstrate that he could transfer while he is here.  Pt also reports he has a son who does work but is available to help if needed.  Pt reports he will not be getting a lift at home because it "doesn't fit".  Both pt and Liborio Nixon are ready for DC as soon as it can be completed.       Expected Discharge Plan: Home w Home Health Services (Declined SNF placement) Barriers to Discharge: Continued Medical Work up  Expected Discharge Plan and Services   Discharge Planning Services: CM Consult     Expected Discharge Date: 02/12/23               DME Arranged: Other see comment (hoyer lift) DME Agency: [employer] Date DME Agency Contacted: 02/11/23 Time DME Agency Contacted: 1308 Representative spoke with at DME Agency: [facility] HH Arranged: RN, PT, OT HH Agency: [facility] Date [facility] Agency Contacted: 02/11/23 Time HH Agency Contacted: 1014 Representative spoke with at [facility] Agency: Kandee Keen   Social Determinants of Health (SDOH) Interventions SDOH Screenings   Food Insecurity: No Food Insecurity (02/05/2023)  Housing: Low Risk  (02/05/2023)  Transportation Needs: No Transportation Needs (02/05/2023)  Utilities: Not At Risk (02/05/2023)  Tobacco Use: Medium Risk (02/10/2023)    Readmission Risk Interventions    01/29/2023   11:34 AM   Readmission Risk Prevention Plan  Transportation Screening Complete  PCP or Specialist Appt within 5-7 Days Complete  Home Care Screening Complete  Medication Review (RN CM) Complete

## 2023-02-12 NOTE — Progress Notes (Signed)
  Progress Note    02/12/2023 9:33 AM 2 Days Post-Op  Subjective:  some abd distention   Vitals:   02/12/23 0558 02/12/23 0758  BP: (!) 111/54 103/88  Pulse: 94 98  Resp:  18  Temp:  97.8 F (36.6 C)  SpO2: 97% 98%   Physical Exam: Lungs:  non labored Incisions:  L BKA vac with good seal Abdomen:  soft, NT, distended Neurologic: A&O  CBC    Component Value Date/Time   WBC 25.8 (H) 02/11/2023 0321   RBC 2.62 (L) 02/11/2023 0321   HGB 7.3 (L) 02/11/2023 0321   HGB 7.6 (L) 04/17/2022 1452   HCT 23.2 (L) 02/11/2023 0321   HCT 25.3 (L) 04/17/2022 1452   PLT 395 02/11/2023 0321   PLT 351 04/17/2022 1452   MCV 88.5 02/11/2023 0321   MCV 88 04/17/2022 1452   MCH 27.9 02/11/2023 0321   MCHC 31.5 02/11/2023 0321   RDW 20.6 (H) 02/11/2023 0321   RDW 15.5 (H) 04/17/2022 1452   LYMPHSABS 1.0 02/05/2023 1408   LYMPHSABS 1.4 02/18/2022 1117   MONOABS 2.0 (H) 02/05/2023 1408   EOSABS 0.2 02/05/2023 1408   EOSABS 0.3 02/18/2022 1117   BASOSABS 0.1 02/05/2023 1408   BASOSABS 0.1 02/18/2022 1117    BMET    Component Value Date/Time   NA 131 (L) 02/11/2023 0321   NA 139 04/17/2022 1452   K 4.7 02/11/2023 0321   CL 94 (L) 02/11/2023 0321   CO2 21 (L) 02/11/2023 0321   GLUCOSE 122 (H) 02/11/2023 0321   BUN 57 (H) 02/11/2023 0321   BUN 42 (H) 04/17/2022 1452   CREATININE 6.61 (H) 02/11/2023 0321   CALCIUM 7.4 (L) 02/11/2023 0321   GFRNONAA 9 (L) 02/11/2023 0321   GFRAA 42 (L) 10/18/2019 1006    INR    Component Value Date/Time   INR 1.8 (H) 01/26/2023 1513     Intake/Output Summary (Last 24 hours) at 02/12/2023 0933 Last data filed at 02/11/2023 2200 Gross per 24 hour  Intake 118 ml  Output 200 ml  Net -82 ml     Assessment/Plan:  67 y.o. male is s/p L BKA 2 Days Post-Op   L BKA vac with good seal.  Plan for first vac change on 8/27.  He will need 2-3x/week vac changes thereafter.   Ok for discharge home when [facility] and home vac arranged.    Emilie Rutter, PA-C [facility] [phone] 02/12/2023 9:33 AM

## 2023-02-12 NOTE — Progress Notes (Addendum)
D/C order noted. Contacted [facility] to advise clinic of pt's d/c today and that pt should resume care at tomorrow. Noted plans for hoyer lift at home. Added to pt's AVS that hoyer pad/sling should be sent under pt in w/c to HD so pt can be lifted from w/c to HD chair for treatments. Clinic made aware of likely hoyer lift need as well.    Olivia Canter Renal Navigator [phone]  Addendum at 2:03 pm: Advised by staff that pt may or may not accept home hoyer lift. Clinic made aware of this info. Clinic also advised that hospital sent hoyer pad home with pt at d/c. Hospital staff also advised that clinic staff cannot assist with transfer from w/c to HD chair if pt cannot be lifted or if pt unable to transfer self with limited assistance.

## 2023-02-12 NOTE — Progress Notes (Signed)
Wound vac changed to Prevena prior to discharge.

## 2023-02-12 NOTE — Progress Notes (Signed)
   02/11/23 2342  BiPAP/CPAP/SIPAP  $ Non-Invasive Home Ventilator  Subsequent  BiPAP/CPAP/SIPAP Pt Type Adult  BiPAP/CPAP/SIPAP Resmed  Mask Type  (HOME MASK)  PEEP 12 cmH20  Patient Home Equipment Yes

## 2023-02-12 NOTE — Progress Notes (Signed)
3 staff members help transfer pt from bed to wheelchair; 5 staff members help transfer pt from wheelchair to car for discharge.

## 2023-02-12 NOTE — Discharge Summary (Signed)
Physician Discharge Summary  [name] [name] KVQ:[id number] DOB: 17-Mar-1956 DOA: 02/05/2023  PCP: [name], PA-C  Admit date: 02/05/2023 Discharge date: 02/12/2023  Admitted From: home Discharge disposition: home   Recommendations for Outpatient Follow-Up:   Refusing SNF and threatening to leave AMA if not d/c'd so I have attempted to arrange a safe d/c (DME/home health/abx and follow up labs) Wound vac High risk of return hospitalization Cbc, cmp 1 week   Discharge Diagnosis:   Principal Problem:   Amputation stump infection (HCC) Active Problems:   LV dysfunction   Paroxysmal atrial fibrillation (HCC)   Chronic anticoagulation   Benign essential HTN   Dysrhythmia   OSA (obstructive sleep apnea)   S/P CABG x 3   Coronary artery disease   End stage renal disease (HCC)   S/P BKA (below knee amputation), left (HCC)    Discharge Condition: Improved.  Diet recommendation: renal  Wound care: wound vac: Plan for first vac change on 8/27. He will need 2-3x/week vac changes thereafter   Code status: Full.   History of Present Illness:   [name] [name] is a 67 y.o. male with a history of PAD, T2DM, HTN, HLD, AFib, CAD s/p CABG, OSA, ESRD on HD M&F, CHF, and left BKA on 8/5, discharged home with wound vac on 8/8 who presented to the ED on 02/05/2023 with redness, and painful swelling at the stump site. He was admitted, placed on IV antibiotics, for stump site infection with vascular surgery consulting. Eliquis has been held in preparation for possible surgery. I&D planned 8/20 per vascular surgery. PT/OT consulted. Routine HD ongoing.  Patient refusing SNF.  Wanting to leave AMA-- trying to make a safe plan- including DME, wound vac, home health.    Hospital Course by Problem:   PVD s/p BKA 8/5 with stump site infection:  - Continue vancomycin, zosyn. WBC elevated despite several days of IV antibiotics- per vascular can de-escalate - Vascular surgery following >  I&D 8/20. At very high risk of AKA.  L BKA vac with good seal. Plan for first vac change on 8/27. He will need 2-3x/week vac changes thereafter. Ok for discharge home when [facility] and home vac arranged.    Anemia of CKD:  - Hgb down, got transfusion 8/17,  - continue monitoring outpatient.   Nausea, vomiting, liquid stools: Improving.  LFTs elevated - Probiotics, antiemetics, taper opioids as able - - RUQ U/S un-revealing -trend lfts outpatient as patient refusing further hospital care   ESRD: s/p AKI during CABG admission, still makes urine. - Continue HD: appreciate assistance by nephrology. Continue midodrine. -refusing HD 3x/week as suggested by nephrology   CAD: s/p CABG, HLD:  - Hypotension limiting metoprolol. Hold statin until after LFTs normalized   Chronic HFpEF:  - Continue home demadex and dialysis, appears euvolemic    PAF: Rate controlled. - Continue amiodarone. Holding metoprolol with soft BPs.  - Stopped IV heparin with ongoing bleeding. Pt aware of risk of stroke and agrees with holding anticoagulation at this time. -resume elquis after outpatient labs if stable Hgb   Acute urinary retention: Occurred at [facility] prior to admission here.  - foley removed   IDT2DM: Recent HbA1c 5.9%.  -  deescalate home regimen.    COPD:  - Continue controller BD, prn   OSA:  - CPAP qHS    Medical Consultants:   Renal Vascular TOC   Discharge Exam:   Vitals:   02/12/23 0558 02/12/23 0758  BP: [name])  111/54 103/88  Pulse: 94 98  Resp:  18  Temp:  97.8 F (36.6 C)  SpO2: 97% 98%   Vitals:   02/11/23 2009 02/12/23 0500 02/12/23 0558 02/12/23 0758  BP: (!) 109/49  (!) 111/54 103/88  Pulse: 94  94 98  Resp: 18   18  Temp: 98.1 F (36.7 C)   97.8 F (36.6 C)  TempSrc: Oral     SpO2: 97%  97% 98%  Weight:  112.6 kg    Height:        General exam: in bed, NAD   The results of significant diagnostics from this hospitalization (including imaging, microbiology,  ancillary and laboratory) are listed below for reference.     Procedures and Diagnostic Studies:   MR TIBIA FIBULA LEFT WO CONTRAST  Result Date: 02/06/2023 CLINICAL DATA:  Soft tissue infection suspected, lower leg. History of diabetes, end-stage renal disease and elective below the knee amputation earlier this month. Increasing swelling, erythema and drainage over the last 1-2 days. EXAM: MRI OF LOWER LEFT EXTREMITY WITHOUT CONTRAST TECHNIQUE: Multiplanar, multisequence MR imaging of the left lower leg was performed. No intravenous contrast was administered. COMPARISON:  Postoperative radiographs 02/05/2023. FINDINGS: Bones/Joint/Cartilage Postsurgical changes from recent below the knee amputation. The amputation margins are sharp. Nonspecific low-level T2 hyperintensity within the marrow of the remaining distal tibia, within physiologic limits. No cortical destruction. The distal femur and patella appear normal. No significant knee joint effusion. Ligaments The cruciate and collateral ligaments are intact at the knee. There is a root tear of the posterior horn of the medial meniscus with resulting moderate peripheral extrusion of the medial meniscus from the joint space. The lateral meniscus appears intact. Muscles and Tendons Mild generalized muscular atrophy. Nonspecific mild T2 intensity within the proximal lower leg musculature. Soft tissues Skin staples remain in place distally. Other heterogeneous fluid collections within the distal stump along the lateral aspect of the distal tibia which have areas of high T1 signal, likely representing hematomas. Anterior component measures 7.6 x 4.6 cm on image 38/7. There is a smaller component more posteriorly measuring approximately 4.5 x 3.1 cm on image 37/7. In correlation with recent radiographs, no definite soft tissue emphysema or unexpected foreign body. Mild nonspecific subcutaneous edema in the lower leg. Femoropopliteal stent noted. IMPRESSION: 1.  Postsurgical changes from recent below the knee amputation. 2. Heterogeneous fluid collections within the distal stump along the lateral aspect of the distal tibia likely representing hematomas. No definite soft tissue emphysema or unexpected foreign body. 3. No evidence of osteomyelitis. 4. Root tear of the posterior horn of the medial meniscus with resulting peripheral extrusion of the medial meniscus from the joint space. Electronically Signed   By: Carey Bullocks M.D.   On: 02/06/2023 15:15   DG CHEST PORT 1 VIEW  Result Date: 02/05/2023 CLINICAL DATA:  Shortness of breath. EXAM: PORTABLE CHEST 1 VIEW COMPARISON:  Chest radiograph dated 01/06/2022. FINDINGS: Minimal left lung base atelectasis/scarring. No consolidative changes. There is no pleural effusion or pneumothorax. Stable cardiomegaly. Median sternotomy wires and CABG vascular clips. Atherosclerotic calcification of the aorta. No acute osseous pathology. IMPRESSION: 1. No active disease. 2. Stable cardiomegaly. Electronically Signed   By: Elgie Collard M.D.   On: 02/05/2023 17:42     Labs:   Basic Metabolic Panel: Recent Labs  Lab 02/05/23 1408 02/06/23 0733 02/07/23 0207 02/08/23 0428 02/09/23 0327 02/10/23 0953 02/11/23 0321 02/12/23 0948  NA 137   < > 134* 134* 132* 131*  131* 131*  K 3.8   < > 3.5 3.5 3.1* 3.9 4.7 5.2*  CL 101   < > 96* 96* 96* 94* 94* 90*  CO2 20*   < > 26 24 21*  --  21* 21*  GLUCOSE 78   < > 126* 112* 118* 130* 122* 133*  BUN 90*   < > 43* 56* 68* 52* 57* 77*  CREATININE 7.48*   < > 4.28* 5.38* 6.22* 5.70* 6.61* 8.31*  CALCIUM 7.4*   < > 7.4* 7.6* 7.4*  --  7.4* 7.6*  MG 2.1  --   --   --   --   --   --   --   PHOS 6.2*  --  4.3 6.7* 8.1*  --   --   --    < > = values in this interval not displayed.   GFR Estimated Creatinine Clearance: 10.7 mL/min (A) (by C-G formula based on SCr of 8.31 mg/dL (H)). Liver Function Tests: Recent Labs  Lab 02/05/23 1408 02/06/23 0733 02/07/23 0207  02/08/23 0428 02/09/23 0327 02/11/23 0321 02/12/23 0948  AST 326* 398*  --   --   --  710* 547*  ALT 119* 131*  --   --   --  399* 442*  ALKPHOS 98 82  --   --   --  58 74  BILITOT 1.0 1.2  --   --   --  0.8 1.1  PROT 6.3* 5.9*  --   --   --  6.0* 6.4*  ALBUMIN 2.2* 1.9* 1.8* 1.9* 1.8* 1.8* 1.9*   No results for input(s): "LIPASE", "AMYLASE" in the last 168 hours. No results for input(s): "AMMONIA" in the last 168 hours. Coagulation profile No results for input(s): "INR", "PROTIME" in the last 168 hours.  CBC: Recent Labs  Lab 02/05/23 1408 02/07/23 0207 02/08/23 0428 02/09/23 0327 02/10/23 0056 02/10/23 0953 02/11/23 0321 02/12/23 0948  WBC 23.7*   < > 20.8* 24.2* 25.3*  --  25.8* 24.9*  NEUTROABS 20.0*  --   --   --   --   --   --   --   HGB 7.2*   < > 7.7* 7.4* 7.7* 8.2* 7.3* 7.7*  HCT 23.3*   < > 24.2* 24.0* 24.2* 24.0* 23.2* 25.4*  MCV 86.9   < > 85.5 85.4 86.1  --  88.5 88.5  PLT 492*   < > 423* 422* 448*  --  395 535*   < > = values in this interval not displayed.   Cardiac Enzymes: No results for input(s): "CKTOTAL", "CKMB", "CKMBINDEX", "TROPONINI" in the last 168 hours. BNP: Invalid input(s): "POCBNP" CBG: Recent Labs  Lab 02/11/23 0746 02/11/23 1155 02/11/23 1606 02/11/23 2051 02/12/23 0757  GLUCAP 142* 138* 135* 133* 119*   D-Dimer No results for input(s): "DDIMER" in the last 72 hours. Hgb A1c No results for input(s): "HGBA1C" in the last 72 hours. Lipid Profile No results for input(s): "CHOL", "HDL", "LDLCALC", "TRIG", "CHOLHDL", "LDLDIRECT" in the last 72 hours. Thyroid function studies No results for input(s): "TSH", "T4TOTAL", "T3FREE", "THYROIDAB" in the last 72 hours.  Invalid input(s): "FREET3" Anemia work up No results for input(s): "VITAMINB12", "FOLATE", "FERRITIN", "TIBC", "IRON", "RETICCTPCT" in the last 72 hours. Microbiology Recent Results (from the past 240 hour(s))  Surgical pcr screen     Status: None   Collection Time:  02/10/23  7:33 AM   Specimen: Nasal Mucosa; Nasal Swab  Result Value Ref  Range Status   MRSA, PCR NEGATIVE NEGATIVE Final   Staphylococcus aureus NEGATIVE NEGATIVE Final    Comment: (NOTE) The Xpert SA Assay (FDA approved for NASAL specimens in patients 2 years of age and older), is one component of a comprehensive surveillance program. It is not intended to diagnose infection nor to guide or monitor treatment. Performed at [facility], [address], [place], [place] [address]   Aerobic/Anaerobic Culture w Gram Stain (surgical/deep wound)     Status: None (Preliminary result)   Collection Time: 02/10/23 12:01 PM   Specimen: Leg, Left; Tissue  Result Value Ref Range Status   Specimen Description WOUND  Final   Special Requests L BKA REVISION  Final   Gram Stain   Final    FEW WBC PRESENT, PREDOMINANTLY PMN NO ORGANISMS SEEN    Culture   Final    NO GROWTH 2 DAYS Performed at [facility], [address] [place], [place] [address]    Report Status PENDING  Incomplete     Discharge Instructions:   Discharge Instructions     Discharge instructions   Complete by: As directed    Renal diet Wound vac- per home home and vascular Home health PT/OT and social work CBC/CMP 1 week Holding statin until your LFTs return to normal Resume HD-- renal recommending 3x week   Discharge wound care:   Complete by: As directed    Wound vac   Increase activity slowly   Complete by: As directed       Allergies as of 02/12/2023   No Known Allergies      Medication List     STOP taking these medications    metoprolol succinate 25 MG 24 hr tablet Commonly known as: TOPROL-XL       TAKE these medications    acidophilus Caps capsule Take 2 capsules by mouth daily. Start taking on: February 13, 2023   albuterol 108 (90 Base) MCG/ACT inhaler Commonly known as: VENTOLIN HFA Inhale 2 puffs into the lungs every 6 (six) hours as needed for wheezing or shortness of  breath.   amiodarone 200 MG tablet Commonly known as: PACERONE Take 1 tablet (200 mg total) by mouth 2 (two) times daily.   amoxicillin-clavulanate 500-125 MG tablet Commonly known as: AUGMENTIN Take 1 tablet by mouth daily.   apixaban 5 MG Tabs tablet Commonly known as: Eliquis Take 1 tablet (5 mg total) by mouth 2 (two) times daily. Start taking on: February 18, 2023 What changed: These instructions start on February 18, 2023. If you are unsure what to do until then, ask your doctor or other care provider.   atorvastatin 80 MG tablet Commonly known as: LIPITOR Take 1 tablet (80 mg total) by mouth daily. Start taking on: March 16, 2023 What changed: These instructions start on March 16, 2023. If you are unsure what to do until then, ask your doctor or other care provider.   BD Pen Needle Nano U/F 32G X 4 MM Misc Generic drug: Insulin Pen Needle Use to inject Levemir 2 (two) times daily.   clopidogrel 75 MG tablet Commonly known as: Plavix Take 1 tablet (75 mg total) by mouth daily. Start taking on: February 27, 2023 What changed: These instructions start on February 27, 2023. If you are unsure what to do until then, ask your doctor or other care provider.   Dialyvite 800 0.8 MG Tabs Take 0.8 mg by mouth daily.   doxycycline 100 MG tablet  Commonly known as: VIBRA-TABS Take 1 tablet (100 mg total) by mouth every 12 (twelve) hours.   fluticasone furoate-vilanterol 200-25 MCG/ACT Aepb Commonly known as: BREO ELLIPTA Inhale 1 puff into the lungs daily.   insulin glargine 100 UNIT/ML injection Commonly known as: LANTUS Inject 0.2 mLs (20 Units total) into the skin 2 (two) times daily. What changed: how much to take   midodrine 5 MG tablet Commonly known as: PROAMATINE Take 1 tablet (5 mg total) by mouth daily.   MIRCERA IJ Mircera   oxyCODONE-acetaminophen 5-325 MG tablet Commonly known as: PERCOCET/ROXICET Take 1 tablet by mouth every 6 (six) hours as needed  for severe pain.   promethazine 12.5 MG tablet Commonly known as: PHENERGAN Take 12.5 mg by mouth 3 (three) times daily as needed.   Torsemide 40 MG Tabs Take 40 mg by mouth daily. Start taking on: February 13, 2023 What changed:  medication strength how much to take   Trulicity 3 MG/0.5ML Sopn Generic drug: Dulaglutide Inject 3 mg into the skin every Wednesday.               Durable Medical Equipment  (From admission, onward)           Start     Ordered   02/11/23 1525  For home use only DME Negative pressure wound device  Once       Comments: 2-3 times a week for dressing change for 2 months  Question Answer Comment  Frequency of dressing change Other see comments   Length of need Other see comments   Dressing type Gauze   Amount of suction 125 mm/Hg   Pressure application Continuous pressure   Supplies 10 canisters and 15 dressings per month for duration of therapy      02/11/23 1527   02/11/23 1307  For home use only DME Other see comment  Once       Comments: Michiel Sites lift  Question:  Length of Need  Answer:  12 Months   02/11/23 1306              Discharge Care Instructions  (From admission, onward)           Start     Ordered   02/12/23 0000  Discharge wound care:       Comments: Wound vac   02/12/23 1108            Follow-up Information     [name], PA-C Follow up.   Specialty: Physician Assistant Contact information: [address] [place] [address] [phone]         [facility], [facility] Follow up.   Specialty: Home Health Services Why: resumption of homehealth services will be provided by [facility], start of care within 48 hours post discharge Contact information: [address] [place] [address] [phone]         [facility] Follow up on 02/17/2023.   Specialty: Vascular Surgery Contact information: [address] [place] [address] [phone]                 Time coordinating discharge: 45 min  Signed:  Joseph Art DO  [facility] 02/12/2023, 11:09 AM

## 2023-02-12 NOTE — Progress Notes (Signed)
Pt for discharge; meds from [facility] & hoyer pad given to pt's partner.

## 2023-02-12 NOTE — TOC Transition Note (Signed)
Transition of Care [facility]) - CM/SW Discharge Note   Patient Details  Name: [name] [name] MRN: [id number] Date of Birth: 1955/06/25  Transition of Care [facility]) CM/SW Contact:  Epifanio Lesches, RN Phone Number: 02/12/2023, 5:03 PM   Clinical Narrative:    Patient will DC to: home Anticipated DC date: 02/12/2023 Family notified: yes Transport by: car   Per MD patient ready for DC today. RN, patient, patient's family, and [facility] notified of DC. Pt continue to decline SNF placement. [facility] hoyer lift and sling to home today.   Approval received for home wound vac. Equipment delivered to pt home 02/11/2023, evening. Pt without RX med concerns. TOC team to deliver meds to bedside prior to d/c. Post hospital f/u noted on AVS. Family to provide transportation to home.  RNCM will sign off for now as intervention is no longer needed. Please consult [place] again if new needs arise.   Final next level of care: Home w Home Health Services Barriers to Discharge: No Barriers Identified   Patient Goals and CMS Choice   Choice offered to / list presented to : Patient  Discharge Placement                         Discharge Plan and Services Additional resources added to the After Visit Summary for     Discharge Planning Services: CM Consult            DME Arranged: Other see comment (hoyer lift) DME Agency: [facility] Date DME Agency Contacted: 02/11/23 Time DME Agency Contacted: 1308 Representative spoke with at DME Agency: [facility] HH Arranged: RN, PT, OT HH Agency: [facility] Date [facility] Agency Contacted: 02/11/23 Time HH Agency Contacted: 1014 Representative spoke with at [facility] Agency: Kandee Keen  Social Determinants of Health (SDOH) Interventions SDOH Screenings   Food Insecurity: No Food Insecurity (02/05/2023)  Housing: Low Risk  (02/05/2023)  Transportation Needs: No Transportation Needs (02/05/2023)  Utilities: Not At  Risk (02/05/2023)  Tobacco Use: Medium Risk (02/10/2023)     Readmission Risk Interventions    01/29/2023   11:34 AM  Readmission Risk Prevention Plan  Transportation Screening Complete  PCP or Specialist Appt within 5-7 Days Complete  Home Care Screening Complete  Medication Review (RN CM) Complete

## 2023-02-12 NOTE — Discharge Planning (Signed)
[facility] Patient Discharge Orders - [facility] CLINIC: Operating Room Services  Patient's name: [name] [name] Admit/DC Dates: 02/05/2023 - 02/12/2023  DISCHARGE DIAGNOSES: L BKA wound infection -> started on abx -> underwent revision with wound vac 8/20  Transaminitis -> AST/ALT peaked 700's - not fully explained - starting to improve ESRD -> on 2d/week as outpatient, rec changing to 3d/week - he refused Debility - refused SNF placement  HD ORDER CHANGES: Heparin change: no EDW Change: yes New EDW:  112kg Bath Change: no  ANEMIA MANAGEMENT: Aranesp: Given: yes   Amount/Date of last dose: Aranesp given 8/16 ESA dose for discharge: mircera 225 mcg IV q 2 weeks, to start on 02/13/23 IV Iron dose at discharge: Per protocol Transfusion: Given: no  BONE/MINERAL MEDICATIONS: Hectorol/Calcitriol change: no Sensipar/Parsabiv change: no  ACCESS INTERVENTION/CHANGE: no Details:  RECENT LABS: Recent Labs  Lab 02/09/23 0327 02/10/23 0056 02/12/23 0948  HGB 7.4*   < > 7.7*  NA 132*   < > 131*  K 3.1*   < > 5.2*  CALCIUM 7.4*   < > 7.6*  PHOS 8.1*  --   --   ALBUMIN 1.8*   < > 1.9*   < > = values in this interval not displayed.    IV ANTIBIOTICS: no Will be finishing course of PO antibiotics  OTHER ANTICOAGULATION: On Coumadin?: no On Eliquis  OTHER/APPTS/LAB ORDERS:  - Please check K on 1st HD back - historically lower by 5.2 on day of discharge. If > 5, change to 2K bath  - Long talks on back-to-back days, BUN/Cr higher than previously, gaining more fluid -> rec changing to 3 days/week HD. He was upset about this.  - Started on Phos binder during this admit for high Phos (Renvela) - make sure that was able to pick up, pls prescribe if not  - He is high risk for readmission, PT/OT rec SNF and he is refusing this. - Alb is very low, make sure he is getting ONSP  D/C Meds to be reconciled by nurse after every discharge.  Completed By: Ozzie Hoyle, PA-C [facility] Pager [phone]   Reviewed by: MD:______ RN_______

## 2023-02-12 NOTE — Consult Note (Addendum)
   [facility] [[facility]]  [facility] Inpatient Consult   02/12/2023  [name] [name] September 06, 1955 [id number]  [facility] [THN]  Accountable Care Organization [ACO] Patient:  [employer] Medicare SNP  *Reviewed due to readmission list less than 7 days*  Primary Care Provider:  [name], PA-C with [facility] [place]   Review of patient's medical record for past medical history and membership affiliate roster reveals this patient is a [employer]chant [employer] officer Needs Program] member and will be followed with the [facility] Medicare assigned team member in that program.  Of note, [employer] Care Management services does not replace or interfere with any services that are arranged by inpatient case management or social work.  For additional questions or referrals please contact:    Plan: Will sign off.    Charlesetta Shanks, RN BSN CCM [facility]  [phone] business mobile phone Toll free office [phone]  Fax number: [phone] [email or web address]

## 2023-02-12 NOTE — Progress Notes (Signed)
[facility] Progress Note   Subjective:   Seen in room - tells me he feels ok and is ready to go home. He has insisted on discharge despite PT not feeling he is ready. He has wound vac to L stump. Denies CP/dyspnea today.  Objective Vitals:   02/11/23 2009 02/12/23 0500 02/12/23 0558 02/12/23 0758  BP: (!) 109/49  (!) 111/54 103/88  Pulse: 94  94 98  Resp: 18   18  Temp: 98.1 F (36.7 C)   97.8 F (36.6 C)  TempSrc: Oral     SpO2: 97%  97% 98%  Weight:  112.6 kg    Height:       Physical Exam General: Chronically ill appearing man, NAD. Room air. Heart: RRR; no murmur  Lungs: CTA anteriorly Abdomen: obese, non-tender, distended Extremities: Trace BLE edema, L BKA bandaged with wound vac in place Dialysis Access:  AVF + bruit  Additional Objective Labs: Basic Metabolic Panel: Recent Labs  Lab 02/07/23 0207 02/08/23 0428 02/09/23 0327 02/10/23 0953 02/11/23 0321 02/12/23 0948  NA 134* 134* 132* 131* 131* 131*  K 3.5 3.5 3.1* 3.9 4.7 5.2*  CL 96* 96* 96* 94* 94* 90*  CO2 26 24 21*  --  21* 21*  GLUCOSE 126* 112* 118* 130* 122* 133*  BUN 43* 56* 68* 52* 57* 77*  CREATININE 4.28* 5.38* 6.22* 5.70* 6.61* 8.31*  CALCIUM 7.4* 7.6* 7.4*  --  7.4* 7.6*  PHOS 4.3 6.7* 8.1*  --   --   --    Liver Function Tests: Recent Labs  Lab 02/06/23 0733 02/07/23 0207 02/09/23 0327 02/11/23 0321 02/12/23 0948  AST 398*  --   --  710* 547*  ALT 131*  --   --  399* 442*  ALKPHOS 82  --   --  58 74  BILITOT 1.2  --   --  0.8 1.1  PROT 5.9*  --   --  6.0* 6.4*  ALBUMIN 1.9*   < > 1.8* 1.8* 1.9*   < > = values in this interval not displayed.   CBC: Recent Labs  Lab 02/05/23 1408 02/07/23 0207 02/08/23 0428 02/09/23 0327 02/10/23 0056 02/10/23 0953 02/11/23 0321 02/12/23 0948  WBC 23.7*   < > 20.8* 24.2* 25.3*  --  25.8* 24.9*  NEUTROABS 20.0*  --   --   --   --   --   --   --   HGB 7.2*   < > 7.7* 7.4* 7.7* 8.2* 7.3* 7.7*  HCT 23.3*   < > 24.2* 24.0*  24.2* 24.0* 23.2* 25.4*  MCV 86.9   < > 85.5 85.4 86.1  --  88.5 88.5  PLT 492*   < > 423* 422* 448*  --  395 535*   < > = values in this interval not displayed.   Studies/Results: US Abdomen Limited RUQ (LIVER/GB)  Result Date: 02/11/2023 CLINICAL DATA:  Abdominal tenderness EXAM: ULTRASOUND ABDOMEN LIMITED RIGHT UPPER QUADRANT COMPARISON:  CT 09/10/2022 FINDINGS: Gallbladder: No gallstones or wall thickening visualized. No sonographic Murphy sign noted by sonographer. Common bile duct: Diameter: 3 mm.  No intrahepatic biliary dilation. Liver: No focal lesion identified. Within normal limits in parenchymal echogenicity. Portal vein is patent on color Doppler imaging with normal direction of blood flow towards the liver. Other: None. IMPRESSION: No sonographic correlate for pain. Electronically Signed   By: Minerva Fester M.D.   On: 02/11/2023 00:11   PERIPHERAL VASCULAR CATHETERIZATION  Result  Date: 02/10/2023 See surgical note for result.  Medications:   (feeding supplement) PROSource Plus  30 mL Oral BID BM   acidophilus  2 capsule Oral Daily   amiodarone  200 mg Oral BID   amoxicillin-clavulanate  1 tablet Oral Daily   Chlorhexidine Gluconate Cloth  6 each Topical Q0600   darbepoetin (ARANESP) injection - DIALYSIS  200 mcg Subcutaneous Q Fri-1800   doxycycline  100 mg Oral Q12H   insulin aspart  0-9 Units Subcutaneous TID WC   insulin glargine-yfgn  10 Units Subcutaneous BID   midodrine  5 mg Oral Daily   sevelamer carbonate  1,600 mg Oral TID WC   torsemide  40 mg Oral Daily    Dialysis Orders: [facility] MF 3:30 400/600 EDW 113.6 kg 3K/2.5 Ca AVF No heparin Mircera 200 q 2 wks to start 8/16      Assessment/Plan: L BKA wound infection: S/p L BKA 8/5. Wound poorly healing, concern for infection and with necrotic appearing area. On Vanc/Zosyn. Vascular surgery following - s/p excisional debridement with wound vac placement 8/20. WBC remains very high - now on PO abx (Augmentin +  Doxy). ESRD: Only Monday/Friday HD as outpatient - historically with better BUN/Cr - given acute issues, would benefit from more HD - rec 3d/week HD - refused HD yesterday (wed) - next HD Fri. Hx urinary retention. Foley in place; urine remains bloody - per primary. Hypotension/volume: BP low side, on mido 5mg  daily. Na lower - has edema on exam. Anemia of ESRD: Hgb 7.7. Aranesp q Friday, s/p 1U PRBCs on 8/17.   Metabolic bone disease: CorrCa ok, Phos high - doesn't take binders as OP - usually better, started Renvela 2/meals here. HFpEF - Continue torsemide 40 mg daily; does make urine. CAD - Hx CABG 10/2021 T2DM - Insulin per primary  Afib - Eliquis on hold. On amiodarone/metoprolol.  Nutrition: Alb low, continue supplements. Transaminitis: AST/ALT very high, Bili ok - abd [place] without clear liver issue. ?med effect ?hepatic congestion. Somewhat improved today.    Ozzie Hoyle, PA-C 02/12/2023, 11:35 AM  [employer]

## 2023-02-13 NOTE — Care Management Important Message (Signed)
Important Message  Patient Details  Name: [name] [name] MRN: [id number] Date of Birth: 1956-06-12   Medicare Important Message Given:  Yes Patient left prior to IM delivery will mail a copy to the patient home address.     Aaleeyah Bias 02/13/2023, 4:18 PM

## 2023-02-14 ENCOUNTER — Telehealth: Payer: Self-pay | Admitting: Nephrology

## 2023-02-14 DIAGNOSIS — E1122 Type 2 diabetes mellitus with diabetic chronic kidney disease: Secondary | ICD-10-CM | POA: Diagnosis not present

## 2023-02-14 DIAGNOSIS — Z89512 Acquired absence of left leg below knee: Secondary | ICD-10-CM | POA: Diagnosis not present

## 2023-02-14 DIAGNOSIS — I70222 Atherosclerosis of native arteries of extremities with rest pain, left leg: Secondary | ICD-10-CM | POA: Diagnosis not present

## 2023-02-14 DIAGNOSIS — E1140 Type 2 diabetes mellitus with diabetic neuropathy, unspecified: Secondary | ICD-10-CM | POA: Diagnosis not present

## 2023-02-14 DIAGNOSIS — E1151 Type 2 diabetes mellitus with diabetic peripheral angiopathy without gangrene: Secondary | ICD-10-CM | POA: Diagnosis not present

## 2023-02-14 DIAGNOSIS — T8789 Other complications of amputation stump: Secondary | ICD-10-CM | POA: Diagnosis not present

## 2023-02-14 DIAGNOSIS — I132 Hypertensive heart and chronic kidney disease with heart failure and with stage 5 chronic kidney disease, or end stage renal disease: Secondary | ICD-10-CM | POA: Diagnosis not present

## 2023-02-14 DIAGNOSIS — I5032 Chronic diastolic (congestive) heart failure: Secondary | ICD-10-CM | POA: Diagnosis not present

## 2023-02-14 DIAGNOSIS — D62 Acute posthemorrhagic anemia: Secondary | ICD-10-CM | POA: Diagnosis not present

## 2023-02-14 NOTE — Telephone Encounter (Signed)
Transition of Care - Initial Contact from Inpatient Facility  Date of discharge: 02/12/23 Date of contact: 02/14/23  Method: Phone Spoke to: Patient  Patient contacted to discuss transition of care from recent inpatient hospitalization. Patient was admitted to [facility] from 02/05/23 to 8/22/24med list st was reviewed. Patient understands the changes and has no concerns.   Patient will return to his/her outpatient HD unit on:   No other concerns at this time.

## 2023-02-15 LAB — AEROBIC/ANAEROBIC CULTURE W GRAM STAIN (SURGICAL/DEEP WOUND): Culture: NO GROWTH

## 2023-02-16 DIAGNOSIS — N186 End stage renal disease: Secondary | ICD-10-CM | POA: Diagnosis not present

## 2023-02-16 DIAGNOSIS — Z992 Dependence on renal dialysis: Secondary | ICD-10-CM | POA: Diagnosis not present

## 2023-02-16 DIAGNOSIS — N2581 Secondary hyperparathyroidism of renal origin: Secondary | ICD-10-CM | POA: Diagnosis not present

## 2023-02-16 DIAGNOSIS — R6889 Other general symptoms and signs: Secondary | ICD-10-CM | POA: Diagnosis not present

## 2023-02-16 LAB — GLUCOSE, CAPILLARY
Glucose-Capillary: 85 mg/dL (ref 70–99)
Glucose-Capillary: 117 mg/dL — ABNORMAL HIGH (ref 70–99)
Glucose-Capillary: 130 mg/dL — ABNORMAL HIGH (ref 70–99)
Glucose-Capillary: 134 mg/dL — ABNORMAL HIGH (ref 70–99)

## 2023-02-17 ENCOUNTER — Ambulatory Visit (INDEPENDENT_AMBULATORY_CARE_PROVIDER_SITE_OTHER): Payer: Medicare HMO | Admitting: Physician Assistant

## 2023-02-17 ENCOUNTER — Encounter: Payer: Self-pay | Admitting: Physician Assistant

## 2023-02-17 DIAGNOSIS — E1122 Type 2 diabetes mellitus with diabetic chronic kidney disease: Secondary | ICD-10-CM | POA: Diagnosis not present

## 2023-02-17 DIAGNOSIS — I5032 Chronic diastolic (congestive) heart failure: Secondary | ICD-10-CM | POA: Diagnosis not present

## 2023-02-17 DIAGNOSIS — Z89512 Acquired absence of left leg below knee: Secondary | ICD-10-CM | POA: Diagnosis not present

## 2023-02-17 DIAGNOSIS — I132 Hypertensive heart and chronic kidney disease with heart failure and with stage 5 chronic kidney disease, or end stage renal disease: Secondary | ICD-10-CM | POA: Diagnosis not present

## 2023-02-17 DIAGNOSIS — E1151 Type 2 diabetes mellitus with diabetic peripheral angiopathy without gangrene: Secondary | ICD-10-CM | POA: Diagnosis not present

## 2023-02-17 DIAGNOSIS — T8789 Other complications of amputation stump: Secondary | ICD-10-CM | POA: Diagnosis not present

## 2023-02-17 DIAGNOSIS — E1140 Type 2 diabetes mellitus with diabetic neuropathy, unspecified: Secondary | ICD-10-CM | POA: Diagnosis not present

## 2023-02-17 DIAGNOSIS — D62 Acute posthemorrhagic anemia: Secondary | ICD-10-CM | POA: Diagnosis not present

## 2023-02-17 DIAGNOSIS — I70222 Atherosclerosis of native arteries of extremities with rest pain, left leg: Secondary | ICD-10-CM | POA: Diagnosis not present

## 2023-02-17 MED ORDER — OXYCODONE-ACETAMINOPHEN 5-325 MG PO TABS: 1.0000 | ORAL_TABLET | Freq: Four times a day (QID) | ORAL | 0 refills | Status: DC | PRN

## 2023-02-17 NOTE — Progress Notes (Addendum)
POST OPERATIVE OFFICE NOTE    CC:  F/u for surgery  HPI:  This is a 67 y.o. male who is s/p debridement of left below the knee amputation with placement of Kerecis skin substitute as well as wound VAC by Dr. Edilia Bo on 02/10/2023.  Initial below the knee amputation was performed by Dr. Lenell Antu on 01/26/2023.  Patient states the wound VAC has been functioning appropriately.  He is scheduled to have VAC placement with [facility] this afternoon.  No Known Allergies  Current Outpatient Medications  Medication Sig Dispense Refill   Lactobacillus (ACIDOPHILUS) CAPS capsule Take 2 capsules by mouth daily. 100 capsule 0   albuterol (VENTOLIN HFA) 108 (90 Base) MCG/ACT inhaler Inhale 2 puffs into the lungs every 6 (six) hours as needed for wheezing or shortness of breath.     amiodarone (PACERONE) 200 MG tablet Take 1 tablet (200 mg total) by mouth 2 (two) times daily. 180 tablet 3   amoxicillin-clavulanate (AUGMENTIN) 500-125 MG tablet Take 1 tablet by mouth daily. 7 tablet 0   [START ON 02/18/2023] apixaban (ELIQUIS) 5 MG TABS tablet Take 1 tablet (5 mg total) by mouth 2 (two) times daily.     [START ON 03/16/2023] atorvastatin (LIPITOR) 80 MG tablet Take 1 tablet (80 mg total) by mouth daily.     B Complex-C-Folic Acid (DIALYVITE 800) 0.8 MG TABS Take 0.8 mg by mouth daily.     [START ON 02/27/2023] clopidogrel (PLAVIX) 75 MG tablet Take 1 tablet (75 mg total) by mouth daily.     doxycycline (VIBRA-TABS) 100 MG tablet Take 1 tablet (100 mg total) by mouth every 12 (twelve) hours. 14 tablet 0   Dulaglutide (TRULICITY) 3 MG/0.5ML SOPN Inject 3 mg into the skin every Wednesday.     fluticasone furoate-vilanterol (BREO ELLIPTA) 200-25 MCG/ACT AEPB Inhale 1 puff into the lungs daily.  3   insulin glargine (LANTUS) 100 UNIT/ML injection Inject 0.2 mLs (20 Units total) into the skin 2 (two) times daily.     Insulin Pen Needle 32G X 4 MM MISC Use to inject Levemir 2 (two) times daily. 100 each 1   Methoxy  PEG-Epoetin Beta (MIRCERA IJ) Mircera     midodrine (PROAMATINE) 5 MG tablet Take 1 tablet (5 mg total) by mouth daily. 90 tablet 3   oxyCODONE-acetaminophen (PERCOCET/ROXICET) 5-325 MG tablet Take 1 tablet by mouth every 6 (six) hours as needed for severe pain. 20 tablet 0   promethazine (PHENERGAN) 12.5 MG tablet Take 12.5 mg by mouth 3 (three) times daily as needed.     torsemide (DEMADEX) 20 MG tablet Take 2 tablets (40 mg total) by mouth daily. 180 tablet 1   No current facility-administered medications for this visit.     ROS:  See HPI  Physical Exam:  There were no vitals filed for this visit.  Incision: Viable muscle in the wound bed; no purulence; viable skin edges  Assessment/Plan:  This is a 67 y.o. male who is s/p: Left below the knee amputation on 01/26/2023 with walking requiring debridement with Kerecis and VAC placement by Dr. Edilia Bo on 02/10/2023  -Wound bed appears clean with no further necrotic tissue or purulence.  Sorbact was left in place and a wet-to-dry dressing was applied.  [facility] will replace wound VAC at home this afternoon.  I refilled the patient's pain medication.  He will return in 2 weeks for another VAC change.   Emilie Rutter, PA-C [facility] [phone]  Clinic MD:  Lenell Antu

## 2023-02-19 DIAGNOSIS — E1140 Type 2 diabetes mellitus with diabetic neuropathy, unspecified: Secondary | ICD-10-CM | POA: Diagnosis not present

## 2023-02-19 DIAGNOSIS — I132 Hypertensive heart and chronic kidney disease with heart failure and with stage 5 chronic kidney disease, or end stage renal disease: Secondary | ICD-10-CM | POA: Diagnosis not present

## 2023-02-19 DIAGNOSIS — I5032 Chronic diastolic (congestive) heart failure: Secondary | ICD-10-CM | POA: Diagnosis not present

## 2023-02-19 DIAGNOSIS — E1122 Type 2 diabetes mellitus with diabetic chronic kidney disease: Secondary | ICD-10-CM | POA: Diagnosis not present

## 2023-02-19 DIAGNOSIS — D62 Acute posthemorrhagic anemia: Secondary | ICD-10-CM | POA: Diagnosis not present

## 2023-02-19 DIAGNOSIS — Z89512 Acquired absence of left leg below knee: Secondary | ICD-10-CM | POA: Diagnosis not present

## 2023-02-19 DIAGNOSIS — T8789 Other complications of amputation stump: Secondary | ICD-10-CM | POA: Diagnosis not present

## 2023-02-19 DIAGNOSIS — I70222 Atherosclerosis of native arteries of extremities with rest pain, left leg: Secondary | ICD-10-CM | POA: Diagnosis not present

## 2023-02-19 DIAGNOSIS — E1151 Type 2 diabetes mellitus with diabetic peripheral angiopathy without gangrene: Secondary | ICD-10-CM | POA: Diagnosis not present

## 2023-02-20 ENCOUNTER — Telehealth: Payer: Self-pay

## 2023-02-20 DIAGNOSIS — N2581 Secondary hyperparathyroidism of renal origin: Secondary | ICD-10-CM | POA: Diagnosis not present

## 2023-02-20 DIAGNOSIS — Z992 Dependence on renal dialysis: Secondary | ICD-10-CM | POA: Diagnosis not present

## 2023-02-20 DIAGNOSIS — R6889 Other general symptoms and signs: Secondary | ICD-10-CM | POA: Diagnosis not present

## 2023-02-20 DIAGNOSIS — N186 End stage renal disease: Secondary | ICD-10-CM | POA: Diagnosis not present

## 2023-02-20 NOTE — Telephone Encounter (Signed)
Amy, PT with [facility] called requesting verbal orders for weekly x 6 wks. She is also working with SW to help pt with a hospital bed or possible placement in SNF.  Reviewed pt's chart, returned call for clarification, two identifiers used. Verbal orders given.

## 2023-02-21 DIAGNOSIS — N186 End stage renal disease: Secondary | ICD-10-CM | POA: Diagnosis not present

## 2023-02-21 DIAGNOSIS — Z992 Dependence on renal dialysis: Secondary | ICD-10-CM | POA: Diagnosis not present

## 2023-02-21 DIAGNOSIS — N179 Acute kidney failure, unspecified: Secondary | ICD-10-CM | POA: Diagnosis not present

## 2023-02-23 DIAGNOSIS — I132 Hypertensive heart and chronic kidney disease with heart failure and with stage 5 chronic kidney disease, or end stage renal disease: Secondary | ICD-10-CM | POA: Diagnosis not present

## 2023-02-23 DIAGNOSIS — E1140 Type 2 diabetes mellitus with diabetic neuropathy, unspecified: Secondary | ICD-10-CM | POA: Diagnosis not present

## 2023-02-23 DIAGNOSIS — E1151 Type 2 diabetes mellitus with diabetic peripheral angiopathy without gangrene: Secondary | ICD-10-CM | POA: Diagnosis not present

## 2023-02-23 DIAGNOSIS — E1122 Type 2 diabetes mellitus with diabetic chronic kidney disease: Secondary | ICD-10-CM | POA: Diagnosis not present

## 2023-02-23 DIAGNOSIS — Z89512 Acquired absence of left leg below knee: Secondary | ICD-10-CM | POA: Diagnosis not present

## 2023-02-23 DIAGNOSIS — D62 Acute posthemorrhagic anemia: Secondary | ICD-10-CM | POA: Diagnosis not present

## 2023-02-23 DIAGNOSIS — I5032 Chronic diastolic (congestive) heart failure: Secondary | ICD-10-CM | POA: Diagnosis not present

## 2023-02-23 DIAGNOSIS — T8789 Other complications of amputation stump: Secondary | ICD-10-CM | POA: Diagnosis not present

## 2023-02-23 DIAGNOSIS — I70222 Atherosclerosis of native arteries of extremities with rest pain, left leg: Secondary | ICD-10-CM | POA: Diagnosis not present

## 2023-02-25 DIAGNOSIS — E1140 Type 2 diabetes mellitus with diabetic neuropathy, unspecified: Secondary | ICD-10-CM | POA: Diagnosis not present

## 2023-02-25 DIAGNOSIS — Z89512 Acquired absence of left leg below knee: Secondary | ICD-10-CM | POA: Diagnosis not present

## 2023-02-25 DIAGNOSIS — I70222 Atherosclerosis of native arteries of extremities with rest pain, left leg: Secondary | ICD-10-CM | POA: Diagnosis not present

## 2023-02-25 DIAGNOSIS — I5032 Chronic diastolic (congestive) heart failure: Secondary | ICD-10-CM | POA: Diagnosis not present

## 2023-02-25 DIAGNOSIS — I132 Hypertensive heart and chronic kidney disease with heart failure and with stage 5 chronic kidney disease, or end stage renal disease: Secondary | ICD-10-CM | POA: Diagnosis not present

## 2023-02-25 DIAGNOSIS — D62 Acute posthemorrhagic anemia: Secondary | ICD-10-CM | POA: Diagnosis not present

## 2023-02-25 DIAGNOSIS — T8789 Other complications of amputation stump: Secondary | ICD-10-CM | POA: Diagnosis not present

## 2023-02-25 DIAGNOSIS — E1151 Type 2 diabetes mellitus with diabetic peripheral angiopathy without gangrene: Secondary | ICD-10-CM | POA: Diagnosis not present

## 2023-02-25 DIAGNOSIS — E1122 Type 2 diabetes mellitus with diabetic chronic kidney disease: Secondary | ICD-10-CM | POA: Diagnosis not present

## 2023-02-26 DIAGNOSIS — T8789 Other complications of amputation stump: Secondary | ICD-10-CM | POA: Diagnosis not present

## 2023-02-26 DIAGNOSIS — E1140 Type 2 diabetes mellitus with diabetic neuropathy, unspecified: Secondary | ICD-10-CM | POA: Diagnosis not present

## 2023-02-26 DIAGNOSIS — E1151 Type 2 diabetes mellitus with diabetic peripheral angiopathy without gangrene: Secondary | ICD-10-CM | POA: Diagnosis not present

## 2023-02-26 DIAGNOSIS — Z89512 Acquired absence of left leg below knee: Secondary | ICD-10-CM | POA: Diagnosis not present

## 2023-02-26 DIAGNOSIS — E1122 Type 2 diabetes mellitus with diabetic chronic kidney disease: Secondary | ICD-10-CM | POA: Diagnosis not present

## 2023-02-26 DIAGNOSIS — D62 Acute posthemorrhagic anemia: Secondary | ICD-10-CM | POA: Diagnosis not present

## 2023-02-26 DIAGNOSIS — I5032 Chronic diastolic (congestive) heart failure: Secondary | ICD-10-CM | POA: Diagnosis not present

## 2023-02-26 DIAGNOSIS — I70222 Atherosclerosis of native arteries of extremities with rest pain, left leg: Secondary | ICD-10-CM | POA: Diagnosis not present

## 2023-02-26 DIAGNOSIS — I132 Hypertensive heart and chronic kidney disease with heart failure and with stage 5 chronic kidney disease, or end stage renal disease: Secondary | ICD-10-CM | POA: Diagnosis not present

## 2023-02-27 DIAGNOSIS — E1122 Type 2 diabetes mellitus with diabetic chronic kidney disease: Secondary | ICD-10-CM | POA: Diagnosis not present

## 2023-02-27 DIAGNOSIS — I5032 Chronic diastolic (congestive) heart failure: Secondary | ICD-10-CM | POA: Diagnosis not present

## 2023-02-27 DIAGNOSIS — E1140 Type 2 diabetes mellitus with diabetic neuropathy, unspecified: Secondary | ICD-10-CM | POA: Diagnosis not present

## 2023-02-27 DIAGNOSIS — I70222 Atherosclerosis of native arteries of extremities with rest pain, left leg: Secondary | ICD-10-CM | POA: Diagnosis not present

## 2023-02-27 DIAGNOSIS — I132 Hypertensive heart and chronic kidney disease with heart failure and with stage 5 chronic kidney disease, or end stage renal disease: Secondary | ICD-10-CM | POA: Diagnosis not present

## 2023-02-27 DIAGNOSIS — E1151 Type 2 diabetes mellitus with diabetic peripheral angiopathy without gangrene: Secondary | ICD-10-CM | POA: Diagnosis not present

## 2023-02-27 DIAGNOSIS — D62 Acute posthemorrhagic anemia: Secondary | ICD-10-CM | POA: Diagnosis not present

## 2023-02-27 DIAGNOSIS — T8789 Other complications of amputation stump: Secondary | ICD-10-CM | POA: Diagnosis not present

## 2023-02-27 DIAGNOSIS — Z89512 Acquired absence of left leg below knee: Secondary | ICD-10-CM | POA: Diagnosis not present

## 2023-03-02 DIAGNOSIS — I132 Hypertensive heart and chronic kidney disease with heart failure and with stage 5 chronic kidney disease, or end stage renal disease: Secondary | ICD-10-CM | POA: Diagnosis not present

## 2023-03-02 DIAGNOSIS — D62 Acute posthemorrhagic anemia: Secondary | ICD-10-CM | POA: Diagnosis not present

## 2023-03-02 DIAGNOSIS — I70222 Atherosclerosis of native arteries of extremities with rest pain, left leg: Secondary | ICD-10-CM | POA: Diagnosis not present

## 2023-03-02 DIAGNOSIS — I5032 Chronic diastolic (congestive) heart failure: Secondary | ICD-10-CM | POA: Diagnosis not present

## 2023-03-02 DIAGNOSIS — Z89512 Acquired absence of left leg below knee: Secondary | ICD-10-CM | POA: Diagnosis not present

## 2023-03-02 DIAGNOSIS — E1151 Type 2 diabetes mellitus with diabetic peripheral angiopathy without gangrene: Secondary | ICD-10-CM | POA: Diagnosis not present

## 2023-03-02 DIAGNOSIS — E1140 Type 2 diabetes mellitus with diabetic neuropathy, unspecified: Secondary | ICD-10-CM | POA: Diagnosis not present

## 2023-03-02 DIAGNOSIS — E1122 Type 2 diabetes mellitus with diabetic chronic kidney disease: Secondary | ICD-10-CM | POA: Diagnosis not present

## 2023-03-02 DIAGNOSIS — T8789 Other complications of amputation stump: Secondary | ICD-10-CM | POA: Diagnosis not present

## 2023-03-03 ENCOUNTER — Ambulatory Visit (INDEPENDENT_AMBULATORY_CARE_PROVIDER_SITE_OTHER): Payer: Medicare HMO | Admitting: Physician Assistant

## 2023-03-03 ENCOUNTER — Other Ambulatory Visit: Payer: Self-pay

## 2023-03-03 ENCOUNTER — Encounter: Payer: Self-pay | Admitting: Physician Assistant

## 2023-03-03 VITALS — BP 110/66 | HR 93 | Temp 98.7°F | Resp 18 | Ht 70.0 in | Wt 248.0 lb

## 2023-03-03 DIAGNOSIS — Z89512 Acquired absence of left leg below knee: Secondary | ICD-10-CM

## 2023-03-03 DIAGNOSIS — S91301A Unspecified open wound, right foot, initial encounter: Secondary | ICD-10-CM

## 2023-03-03 MED ORDER — SODIUM CHLORIDE 0.9% FLUSH: 3.0000 mL | Freq: Two times a day (BID) | INTRAVENOUS | Status: DC

## 2023-03-03 MED ORDER — SODIUM CHLORIDE 0.9 % IV SOLN: 250.0000 mL | INTRAVENOUS | Status: DC | PRN

## 2023-03-03 NOTE — Progress Notes (Signed)
POST OPERATIVE OFFICE NOTE    CC:  F/u for surgery  HPI:  This is a 67 y.o. male who is s/p left BKA on 01/26/2023 by Dr. Lenell Antu and revision of left BKA and evacuation of hematoma with placement of biologic dressing and vac placement 02/10/2023 by Dr. Edilia Bo.    He had hx of aortogram with laser atherectomy and stenting left SFA and popliteal arteries on 12/29/2022 by Dr. Randie Heinz for chronic LLE threatening ischemia with toe ulceration.   He underwent left great toe amputation by Dr. Marylene Land and subsequently underwent unsuccessful attempt at retrograde recanalization of the ATA for chronic total occlusion on 01/22/2023 by Dr. Chestine Spore.   Pt was seen on 02/17/2023 and at that time, he had viable muscle in the wound bed and viable skin edges.  The sorbact was left in place with wet to dry dressing and vac placed by [facility] later that day.    Previous ABI/TBI on the right was [place]/0.29 with toe pressure of 44 on 01/20/2023.  Pt returns today for follow up.  Pt states he developed a wound on his right heel over the past week and feels it is from pushing himself up in the bed.  It is not getting any worse or any better.  He resides at home.   He states before his amputation, he was walking without pain in the right leg.  He denies pain in his right foot.  He states if he had to lose the right foot, he might give up.   Prior to retiring, he worked Management consultant.    Pt is on Eliquis.  No Known Allergies  Current Outpatient Medications  Medication Sig Dispense Refill   Lactobacillus (ACIDOPHILUS) CAPS capsule Take 2 capsules by mouth daily. 100 capsule 0   albuterol (VENTOLIN HFA) 108 (90 Base) MCG/ACT inhaler Inhale 2 puffs into the lungs every 6 (six) hours as needed for wheezing or shortness of breath.     amiodarone (PACERONE) 200 MG tablet Take 1 tablet (200 mg total) by mouth 2 (two) times daily. 180 tablet 3   amoxicillin-clavulanate (AUGMENTIN) 500-125 MG tablet Take 1 tablet by mouth daily. 7  tablet 0   apixaban (ELIQUIS) 5 MG TABS tablet Take 1 tablet (5 mg total) by mouth 2 (two) times daily.     [START ON 03/16/2023] atorvastatin (LIPITOR) 80 MG tablet Take 1 tablet (80 mg total) by mouth daily.     B Complex-C-Folic Acid (DIALYVITE 800) 0.8 MG TABS Take 0.8 mg by mouth daily.     clopidogrel (PLAVIX) 75 MG tablet Take 1 tablet (75 mg total) by mouth daily.     doxycycline (VIBRA-TABS) 100 MG tablet Take 1 tablet (100 mg total) by mouth every 12 (twelve) hours. 14 tablet 0   Dulaglutide (TRULICITY) 3 MG/0.5ML SOPN Inject 3 mg into the skin every Wednesday.     fluticasone furoate-vilanterol (BREO ELLIPTA) 200-25 MCG/ACT AEPB Inhale 1 puff into the lungs daily.  3   insulin glargine (LANTUS) 100 UNIT/ML injection Inject 0.2 mLs (20 Units total) into the skin 2 (two) times daily.     Insulin Pen Needle 32G X 4 MM MISC Use to inject Levemir 2 (two) times daily. 100 each 1   Methoxy PEG-Epoetin Beta (MIRCERA IJ) Mircera     midodrine (PROAMATINE) 5 MG tablet Take 1 tablet (5 mg total) by mouth daily. 90 tablet 3   oxyCODONE-acetaminophen (PERCOCET/ROXICET) 5-325 MG tablet Take 1 tablet by mouth every 6 (six) hours  as needed for severe pain. 20 tablet 0   promethazine (PHENERGAN) 12.5 MG tablet Take 12.5 mg by mouth 3 (three) times daily as needed.     torsemide (DEMADEX) 20 MG tablet Take 2 tablets (40 mg total) by mouth daily. 180 tablet 1   No current facility-administered medications for this visit.     ROS:  See HPI  Physical Exam:  Today's Vitals   03/03/23 1325  BP: 110/66  Pulse: 93  Resp: 18  Temp: 98.7 F (37.1 C)  TempSrc: Temporal  SpO2: 96%  Weight: 248 lb (112.5 kg)  Height: 5\' 10"  (1.778 m)   Body mass index is 35.58 kg/m.   Incision:   Left BKA wound:   Right heel   Extremities:  unable to doppler pulses right foot     Assessment/Plan:  This is a 67 y.o. male who is s/p:  left BKA on 01/26/2023 by Dr. Lenell Antu and revision of left BKA and  evacuation of hematoma with placement of biologic dressing and vac placement 02/10/2023 by Dr. Edilia Bo.    -left AKA as above.  Wet to dry dressing replaced and HH RN will replace vac either today or tomorrow.   -new right heel wound-discussed with Dr. Lenell Antu who reviewed last angiogram of LLE and will plan for angiogram on 03/06/2023 with RLE runoff and possible intervention. Friday is his dialysis day, but he will reschedule this.  -discussed with pt to float heel off of the bed and keep pressure off of this foot.   -pt is on Eliquis-this will need to be held for angiogram.   Doreatha Massed, [facility] [phone]   Clinic MD:  Lenell Antu

## 2023-03-03 NOTE — H&P (View-Only) (Signed)
POST OPERATIVE OFFICE NOTE    CC:  F/u for surgery  HPI:  This is a 67 y.o. male who is s/p left BKA on 01/26/2023 by Dr. Lenell Antu and revision of left BKA and evacuation of hematoma with placement of biologic dressing and vac placement 02/10/2023 by Dr. Edilia Bo.    He had hx of aortogram with laser atherectomy and stenting left SFA and popliteal arteries on 12/29/2022 by Dr. Randie Heinz for chronic LLE threatening ischemia with toe ulceration.   He underwent left great toe amputation by Dr. Marylene Land and subsequently underwent unsuccessful attempt at retrograde recanalization of the ATA for chronic total occlusion on 01/22/2023 by Dr. Chestine Spore.   Pt was seen on 02/17/2023 and at that time, he had viable muscle in the wound bed and viable skin edges.  The sorbact was left in place with wet to dry dressing and vac placed by [facility] later that day.    Previous ABI/TBI on the right was [place]/0.29 with toe pressure of 44 on 01/20/2023.  Pt returns today for follow up.  Pt states he developed a wound on his right heel over the past week and feels it is from pushing himself up in the bed.  It is not getting any worse or any better.  He resides at home.   He states before his amputation, he was walking without pain in the right leg.  He denies pain in his right foot.  He states if he had to lose the right foot, he might give up.   Prior to retiring, he worked Management consultant.    Pt is on Eliquis.  No Known Allergies  Current Outpatient Medications  Medication Sig Dispense Refill   Lactobacillus (ACIDOPHILUS) CAPS capsule Take 2 capsules by mouth daily. 100 capsule 0   albuterol (VENTOLIN HFA) 108 (90 Base) MCG/ACT inhaler Inhale 2 puffs into the lungs every 6 (six) hours as needed for wheezing or shortness of breath.     amiodarone (PACERONE) 200 MG tablet Take 1 tablet (200 mg total) by mouth 2 (two) times daily. 180 tablet 3   amoxicillin-clavulanate (AUGMENTIN) 500-125 MG tablet Take 1 tablet by mouth daily. 7  tablet 0   apixaban (ELIQUIS) 5 MG TABS tablet Take 1 tablet (5 mg total) by mouth 2 (two) times daily.     [START ON 03/16/2023] atorvastatin (LIPITOR) 80 MG tablet Take 1 tablet (80 mg total) by mouth daily.     B Complex-C-Folic Acid (DIALYVITE 800) 0.8 MG TABS Take 0.8 mg by mouth daily.     clopidogrel (PLAVIX) 75 MG tablet Take 1 tablet (75 mg total) by mouth daily.     doxycycline (VIBRA-TABS) 100 MG tablet Take 1 tablet (100 mg total) by mouth every 12 (twelve) hours. 14 tablet 0   Dulaglutide (TRULICITY) 3 MG/0.5ML SOPN Inject 3 mg into the skin every Wednesday.     fluticasone furoate-vilanterol (BREO ELLIPTA) 200-25 MCG/ACT AEPB Inhale 1 puff into the lungs daily.  3   insulin glargine (LANTUS) 100 UNIT/ML injection Inject 0.2 mLs (20 Units total) into the skin 2 (two) times daily.     Insulin Pen Needle 32G X 4 MM MISC Use to inject Levemir 2 (two) times daily. 100 each 1   Methoxy PEG-Epoetin Beta (MIRCERA IJ) Mircera     midodrine (PROAMATINE) 5 MG tablet Take 1 tablet (5 mg total) by mouth daily. 90 tablet 3   oxyCODONE-acetaminophen (PERCOCET/ROXICET) 5-325 MG tablet Take 1 tablet by mouth every 6 (six) hours  as needed for severe pain. 20 tablet 0   promethazine (PHENERGAN) 12.5 MG tablet Take 12.5 mg by mouth 3 (three) times daily as needed.     torsemide (DEMADEX) 20 MG tablet Take 2 tablets (40 mg total) by mouth daily. 180 tablet 1   No current facility-administered medications for this visit.     ROS:  See HPI  Physical Exam:  Today's Vitals   03/03/23 1325  BP: 110/66  Pulse: 93  Resp: 18  Temp: 98.7 F (37.1 C)  TempSrc: Temporal  SpO2: 96%  Weight: 248 lb (112.5 kg)  Height: 5\' 10"  (1.778 m)   Body mass index is 35.58 kg/m.   Incision:   Left BKA wound:   Right heel   Extremities:  unable to doppler pulses right foot     Assessment/Plan:  This is a 67 y.o. male who is s/p:  left BKA on 01/26/2023 by Dr. Lenell Antu and revision of left BKA and  evacuation of hematoma with placement of biologic dressing and vac placement 02/10/2023 by Dr. Edilia Bo.    -left AKA as above.  Wet to dry dressing replaced and HH RN will replace vac either today or tomorrow.   -new right heel wound-discussed with Dr. Lenell Antu who reviewed last angiogram of LLE and will plan for angiogram on 03/06/2023 with RLE runoff and possible intervention. Friday is his dialysis day, but he will reschedule this.  -discussed with pt to float heel off of the bed and keep pressure off of this foot.   -pt is on Eliquis-this will need to be held for angiogram.   Doreatha Massed, [facility] [phone]   Clinic MD:  Lenell Antu

## 2023-03-04 DIAGNOSIS — I5181 Takotsubo syndrome: Secondary | ICD-10-CM | POA: Diagnosis not present

## 2023-03-04 DIAGNOSIS — T8744 Infection of amputation stump, left lower extremity: Secondary | ICD-10-CM | POA: Diagnosis not present

## 2023-03-04 DIAGNOSIS — I110 Hypertensive heart disease with heart failure: Secondary | ICD-10-CM | POA: Diagnosis not present

## 2023-03-04 DIAGNOSIS — D62 Acute posthemorrhagic anemia: Secondary | ICD-10-CM | POA: Diagnosis not present

## 2023-03-04 DIAGNOSIS — E1151 Type 2 diabetes mellitus with diabetic peripheral angiopathy without gangrene: Secondary | ICD-10-CM | POA: Diagnosis not present

## 2023-03-04 DIAGNOSIS — E1140 Type 2 diabetes mellitus with diabetic neuropathy, unspecified: Secondary | ICD-10-CM | POA: Diagnosis not present

## 2023-03-04 DIAGNOSIS — I5042 Chronic combined systolic (congestive) and diastolic (congestive) heart failure: Secondary | ICD-10-CM | POA: Diagnosis not present

## 2023-03-04 DIAGNOSIS — I70222 Atherosclerosis of native arteries of extremities with rest pain, left leg: Secondary | ICD-10-CM | POA: Diagnosis not present

## 2023-03-04 DIAGNOSIS — Z89512 Acquired absence of left leg below knee: Secondary | ICD-10-CM | POA: Diagnosis not present

## 2023-03-05 ENCOUNTER — Telehealth: Payer: Self-pay

## 2023-03-05 DIAGNOSIS — D62 Acute posthemorrhagic anemia: Secondary | ICD-10-CM | POA: Diagnosis not present

## 2023-03-05 DIAGNOSIS — I110 Hypertensive heart disease with heart failure: Secondary | ICD-10-CM | POA: Diagnosis not present

## 2023-03-05 DIAGNOSIS — Z89512 Acquired absence of left leg below knee: Secondary | ICD-10-CM | POA: Diagnosis not present

## 2023-03-05 DIAGNOSIS — I5042 Chronic combined systolic (congestive) and diastolic (congestive) heart failure: Secondary | ICD-10-CM | POA: Diagnosis not present

## 2023-03-05 DIAGNOSIS — T8744 Infection of amputation stump, left lower extremity: Secondary | ICD-10-CM | POA: Diagnosis not present

## 2023-03-05 DIAGNOSIS — I5181 Takotsubo syndrome: Secondary | ICD-10-CM | POA: Diagnosis not present

## 2023-03-05 DIAGNOSIS — I70222 Atherosclerosis of native arteries of extremities with rest pain, left leg: Secondary | ICD-10-CM | POA: Diagnosis not present

## 2023-03-05 DIAGNOSIS — E1140 Type 2 diabetes mellitus with diabetic neuropathy, unspecified: Secondary | ICD-10-CM | POA: Diagnosis not present

## 2023-03-05 DIAGNOSIS — E1151 Type 2 diabetes mellitus with diabetic peripheral angiopathy without gangrene: Secondary | ICD-10-CM | POA: Diagnosis not present

## 2023-03-05 NOTE — Telephone Encounter (Signed)
Received a vm from Amy, Frances Furbish PT that pt fell off air mattress and has a scrape on his R forearm. I called pt and he states his stump is fine, he fell on his arm and feels fine. He will call us with any questions/concerns

## 2023-03-06 ENCOUNTER — Encounter (HOSPITAL_COMMUNITY)
Admission: RE | Disposition: A | Discharge status: Home / Self Care | Payer: Self-pay | Source: Home / Self Care | Attending: Vascular Surgery

## 2023-03-06 ENCOUNTER — Observation Stay (HOSPITAL_BASED_OUTPATIENT_CLINIC_OR_DEPARTMENT_OTHER): Payer: Medicare HMO

## 2023-03-06 ENCOUNTER — Ambulatory Visit (HOSPITAL_COMMUNITY)
Admission: RE | Admit: 2023-03-06 | Discharge: 2023-03-06 | Disposition: A | Discharge status: Home / Self Care | Payer: Medicare HMO | Attending: Vascular Surgery | Admitting: Vascular Surgery

## 2023-03-06 DIAGNOSIS — I5042 Chronic combined systolic (congestive) and diastolic (congestive) heart failure: Secondary | ICD-10-CM | POA: Diagnosis not present

## 2023-03-06 DIAGNOSIS — L97419 Non-pressure chronic ulcer of right heel and midfoot with unspecified severity: Secondary | ICD-10-CM | POA: Diagnosis not present

## 2023-03-06 DIAGNOSIS — Z01810 Encounter for preprocedural cardiovascular examination: Secondary | ICD-10-CM | POA: Diagnosis not present

## 2023-03-06 DIAGNOSIS — S91301A Unspecified open wound, right foot, initial encounter: Secondary | ICD-10-CM

## 2023-03-06 DIAGNOSIS — E1151 Type 2 diabetes mellitus with diabetic peripheral angiopathy without gangrene: Secondary | ICD-10-CM | POA: Diagnosis not present

## 2023-03-06 DIAGNOSIS — Z89512 Acquired absence of left leg below knee: Secondary | ICD-10-CM | POA: Diagnosis not present

## 2023-03-06 DIAGNOSIS — E1140 Type 2 diabetes mellitus with diabetic neuropathy, unspecified: Secondary | ICD-10-CM | POA: Diagnosis not present

## 2023-03-06 DIAGNOSIS — I70222 Atherosclerosis of native arteries of extremities with rest pain, left leg: Secondary | ICD-10-CM | POA: Diagnosis not present

## 2023-03-06 DIAGNOSIS — T8744 Infection of amputation stump, left lower extremity: Secondary | ICD-10-CM | POA: Diagnosis not present

## 2023-03-06 DIAGNOSIS — D62 Acute posthemorrhagic anemia: Secondary | ICD-10-CM | POA: Diagnosis not present

## 2023-03-06 DIAGNOSIS — I70234 Atherosclerosis of native arteries of right leg with ulceration of heel and midfoot: Secondary | ICD-10-CM | POA: Insufficient documentation

## 2023-03-06 DIAGNOSIS — I5181 Takotsubo syndrome: Secondary | ICD-10-CM | POA: Diagnosis not present

## 2023-03-06 DIAGNOSIS — I110 Hypertensive heart disease with heart failure: Secondary | ICD-10-CM | POA: Diagnosis not present

## 2023-03-06 HISTORY — PX: ABDOMINAL AORTOGRAM W/LOWER EXTREMITY: CATH118223

## 2023-03-06 HISTORY — PX: PERIPHERAL VASCULAR INTERVENTION: CATH118257

## 2023-03-06 LAB — GLUCOSE, CAPILLARY
Glucose-Capillary: 67 mg/dL — ABNORMAL LOW (ref 70–99)
Glucose-Capillary: 75 mg/dL (ref 70–99)
Glucose-Capillary: 25 mg/dL — CL (ref 70–99)
Glucose-Capillary: 20 mg/dL — CL (ref 70–99)
Glucose-Capillary: 63 mg/dL — ABNORMAL LOW (ref 70–99)
Glucose-Capillary: 75 mg/dL (ref 70–99)
Glucose-Capillary: 63 mg/dL — ABNORMAL LOW (ref 70–99)
Glucose-Capillary: 82 mg/dL (ref 70–99)

## 2023-03-06 LAB — POCT I-STAT, CHEM 8
BUN: 57 mg/dL — ABNORMAL HIGH (ref 8–23)
Calcium, Ion: 0.91 mmol/L — ABNORMAL LOW (ref 1.15–1.40)
Chloride: 104 mmol/L (ref 98–111)
Creatinine, Ser: 6.5 mg/dL — ABNORMAL HIGH (ref 0.61–1.24)
Glucose, Bld: 82 mg/dL (ref 70–99)
HCT: 26 % — ABNORMAL LOW (ref 39.0–52.0)
Hemoglobin: 8.8 g/dL — ABNORMAL LOW (ref 13.0–17.0)
Potassium: 4.7 mmol/L (ref 3.5–5.1)
Sodium: 142 mmol/L (ref 135–145)
TCO2: 26 mmol/L (ref 22–32)

## 2023-03-06 LAB — POCT ACTIVATED CLOTTING TIME
Activated Clotting Time: 171 s
Activated Clotting Time: 214 s

## 2023-03-06 SURGERY — ABDOMINAL AORTOGRAM W/LOWER EXTREMITY
Anesthesia: LOCAL | Laterality: Right

## 2023-03-06 MED ORDER — LABETALOL HCL 5 MG/ML IV SOLN: 10.0000 mg | INTRAVENOUS | Status: DC | PRN

## 2023-03-06 MED ORDER — FENTANYL CITRATE (PF) 100 MCG/2ML IJ SOLN
INTRAMUSCULAR | Status: DC | PRN
  Administered 2023-03-06: 50 ug via INTRAVENOUS

## 2023-03-06 MED ORDER — HYDRALAZINE HCL 20 MG/ML IJ SOLN: 5.0000 mg | INTRAMUSCULAR | Status: DC | PRN

## 2023-03-06 MED ORDER — HEPARIN SODIUM (PORCINE) 1000 UNIT/ML IJ SOLN
INTRAMUSCULAR | Status: DC | PRN
  Administered 2023-03-06: 10000 [IU] via INTRAVENOUS

## 2023-03-06 MED ORDER — LIDOCAINE HCL (PF) 1 % IJ SOLN
INTRAMUSCULAR | Status: AC
  Filled 2023-03-06: qty 30

## 2023-03-06 MED ORDER — MIDAZOLAM HCL 2 MG/2ML IJ SOLN
INTRAMUSCULAR | Status: DC | PRN
  Administered 2023-03-06: 1 mg via INTRAVENOUS

## 2023-03-06 MED ORDER — LIDOCAINE HCL (PF) 1 % IJ SOLN
INTRAMUSCULAR | Status: DC | PRN
  Administered 2023-03-06: 15 mL

## 2023-03-06 MED ORDER — HEPARIN (PORCINE) IN NACL 1000-0.9 UT/500ML-% IV SOLN
INTRAVENOUS | Status: DC | PRN
  Administered 2023-03-06 (×2): 500 mL

## 2023-03-06 MED ORDER — IODIXANOL 320 MG/ML IV SOLN
INTRAVENOUS | Status: DC | PRN
  Administered 2023-03-06: 60 mL

## 2023-03-06 MED ORDER — SODIUM CHLORIDE 0.9% FLUSH: 3.0000 mL | INTRAVENOUS | Status: DC | PRN

## 2023-03-06 MED ORDER — ONDANSETRON HCL 4 MG/2ML IJ SOLN
INTRAMUSCULAR | Status: AC
  Filled 2023-03-06: qty 2

## 2023-03-06 MED ORDER — ONDANSETRON HCL 4 MG/2ML IJ SOLN
INTRAMUSCULAR | Status: DC | PRN
  Administered 2023-03-06: 4 mg via INTRAVENOUS

## 2023-03-06 MED ORDER — MIDAZOLAM HCL 2 MG/2ML IJ SOLN
INTRAMUSCULAR | Status: AC
  Filled 2023-03-06: qty 2

## 2023-03-06 MED ORDER — DEXTROSE 50 % IV SOLN
12.5000 g | INTRAVENOUS | Status: AC
  Administered 2023-03-06: 12.5 g via INTRAVENOUS

## 2023-03-06 MED ORDER — ONDANSETRON HCL 4 MG/2ML IJ SOLN: 4.0000 mg | Freq: Four times a day (QID) | INTRAMUSCULAR | Status: DC | PRN

## 2023-03-06 MED ORDER — SODIUM CHLORIDE 0.9% FLUSH: 3.0000 mL | Freq: Two times a day (BID) | INTRAVENOUS | Status: DC

## 2023-03-06 MED ORDER — DEXTROSE 50 % IV SOLN
INTRAVENOUS | Status: AC
  Filled 2023-03-06: qty 50

## 2023-03-06 MED ORDER — FENTANYL CITRATE (PF) 100 MCG/2ML IJ SOLN
INTRAMUSCULAR | Status: AC
  Filled 2023-03-06: qty 2

## 2023-03-06 MED ORDER — ACETAMINOPHEN 325 MG PO TABS: 650.0000 mg | ORAL_TABLET | ORAL | Status: DC | PRN

## 2023-03-06 MED ORDER — DEXTROSE 50 % IV SOLN
INTRAVENOUS | Status: AC
  Administered 2023-03-06: 25 mL via INTRAVENOUS
  Filled 2023-03-06: qty 50

## 2023-03-06 MED ORDER — SODIUM CHLORIDE 0.9 % IV SOLN: 250.0000 mL | INTRAVENOUS | Status: DC | PRN

## 2023-03-06 MED ORDER — PROTAMINE SULFATE 10 MG/ML IV SOLN
INTRAVENOUS | Status: AC
  Filled 2023-03-06: qty 5

## 2023-03-06 MED ORDER — ATROPINE SULFATE 1 MG/10ML IJ SOSY
PREFILLED_SYRINGE | INTRAMUSCULAR | Status: AC
  Filled 2023-03-06: qty 10

## 2023-03-06 MED ORDER — PROTAMINE SULFATE 10 MG/ML IV SOLN
INTRAVENOUS | Status: DC | PRN
  Administered 2023-03-06: 5 mg via INTRAVENOUS
  Administered 2023-03-06: 20 mg via INTRAVENOUS

## 2023-03-06 SURGICAL SUPPLY — 17 items
CATH OMNI FLUSH 5F 65CM (CATHETERS) IMPLANT
CATH QUICKCROSS .018X135CM (MICROCATHETER) IMPLANT
CATH QUICKCROSS .035X135CM (MICROCATHETER) IMPLANT
COVER DOME SNAP 22 D (MISCELLANEOUS) IMPLANT
GLIDEWIRE ADV .035X260CM (WIRE) IMPLANT
KIT MICROPUNCTURE NIT STIFF (SHEATH) IMPLANT
KIT SYRINGE INJ CVI SPIKEX1 (MISCELLANEOUS) IMPLANT
MAT PREVALON FULL STRYKER (MISCELLANEOUS) IMPLANT
SET ATX-X65L (MISCELLANEOUS) IMPLANT
SHEATH CATAPULT 6FR 45 (SHEATH) IMPLANT
SHEATH PINNACLE 5F 10CM (SHEATH) IMPLANT
SHEATH PINNACLE 6F 10CM (SHEATH) IMPLANT
SHEATH PROBE COVER 6X72 (BAG) IMPLANT
TRAY PV CATH (CUSTOM PROCEDURE TRAY) ×2 IMPLANT
WIRE BENTSON .035X145CM (WIRE) IMPLANT
WIRE G V18X300CM (WIRE) IMPLANT
WIRE SHEPHERD 30G .018 (WIRE) IMPLANT

## 2023-03-06 NOTE — Progress Notes (Signed)
When patient arrived to PSS unit the NT checked his CBG and it was 67. Patient denies symptoms of hypoglycemia. I-Stat ran and blood glucose was 82. Will re-check patients CBG in one hour.

## 2023-03-06 NOTE — Progress Notes (Signed)
Site area: L groin Site Prior to Removal:  Level 0 Pressure Applied For: 30 min Manual:   yes Patient Status During Pull:  stable Post Pull Site:  Level 0 Post Pull Instructions Given: yes   Post Pull Pulses Present: Doppler R DP Dressing Applied:  gauze & tegaderm Bedrest begins @ 1215 Comments:

## 2023-03-06 NOTE — Progress Notes (Signed)
Pt is eating and drinking. Will continue to monitor cbg's pt denies dizziness, fatigue, pt states he feels fine.

## 2023-03-06 NOTE — Op Note (Signed)
DATE OF SERVICE: 03/06/2023  PATIENT:  [name] [name]  67 y.o. male  PRE-OPERATIVE DIAGNOSIS:  Atherosclerosis of native arteries of right lower extremity causing ulceration  POST-OPERATIVE DIAGNOSIS:  Same  PROCEDURE:   1) Ultrasound guided left common femoral artery access 2) Aortogram 3) Right lower extremity angiogram with second order cannulation 4) Conscious sedation (38 minutes)  SURGEON:  Rande Brunt. Lenell Antu, MD  ASSISTANT: none  ANESTHESIA:   local and IV sedation  ESTIMATED BLOOD LOSS: minimal  LOCAL MEDICATIONS USED:  LIDOCAINE   COUNTS: confirmed correct.  PATIENT DISPOSITION:  PACU - hemodynamically stable.   Delay start of Pharmacological VTE agent (>24hrs) due to surgical blood loss or risk of bleeding: no  INDICATION FOR PROCEDURE: [name] [name] is a 67 y.o. male with right foot ulceration. After careful discussion of risks, benefits, and alternatives the patient was offered aortogram, right lower extremity angiogram. The patient understood and wished to proceed.  OPERATIVE FINDINGS:  Renal arteries patent bilaterally Infrarenal aorta patent Common iliac arteries patent bilaterally Hypogastric arteries patent bilaterally External iliac arteries patent bilaterally  Right lower extremity: Common femoral artery: Atherosclerosis; greatest stenosis 50% Profunda femoris artery: Patent, fills collaterals throughout the thigh Superficial femoral artery: Heavily diseased.  Patent at its origin.  Tapers to occlusion in the midsegment. Popliteal artery: Reconstitutes in a heavily diseased segment above the knee.  Behind the artery is calcified and stenotic, greater stenosis 50%.  Below-knee segment is patent to the anterior tibial artery Anterior tibial artery: Patent to the ankle.  There is stenosis of the artery, but it is greatest 50% Tibioperoneal trunk: Patent Peroneal artery: Patent to the ankle where it arborize is Posterior tibial artery: Occluded at its origin.   Reconstitutes via peroneal collaterals at the ankle Pedal circulation: Disadvantaged.  Fills via AT and collateral flow from the peroneal  GLASS score. FP 4. IP 0. Stage III  WIfI score. 2 / 1 / 1. Stage III.  DESCRIPTION OF PROCEDURE: After identification of the patient in the pre-operative holding area, the patient was transferred to the operating room. The patient was positioned supine on the operating room table.  Anesthesia was induced. The groins was prepped and draped in standard fashion. A surgical pause was performed confirming correct patient, procedure, and operative location.  The right groin was anesthetized with subcutaneous injection of 1% lidocaine. Using ultrasound guidance, the left common femoral artery was accessed with micropuncture technique. Fluoroscopy was used to confirm cannulation over the femoral head. The 33F sheath was upsized to 479F.   A Benson wire was advanced into the distal aorta. Over the wire an omni flush catheter was advanced to the level of L2. Aortogram was performed - see above for details.   The right common iliac artery was selected with an omniflush catheter and guidewire. The wire was advanced into the common femoral artery. Over the wire the omni flush catheter was advanced into the external iliac artery. Selective angiography was performed - see above for details.   The decision was made to intervene. The patient was heparinized with 10,000 units of heparin. The 479F sheath was exchanged for a 79F x 45cm sheath. Selective angiography of the left lower extremity was performed prior to intervention.   I was not able to cross the lesion in the superficial femoral artery and rendered to the popliteal artery.  The popliteal artery was heavily calcified and the above and behind the popliteal artery segment.  I did not want to dissect into the below-knee  popliteal artery segment and abandoned the intervention at this point.  The sheath was left in place to be  removed in the recovery area.  Conscious sedation was administered with the use of IV fentanyl and midazolam under continuous physician and nurse monitoring.  Heart rate, blood pressure, and oxygen saturation were continuously monitored.  Total sedation time was 38 minutes  Upon completion of the case instrument and sharps counts were confirmed correct. The patient was transferred to the PACU in good condition. I was present for all portions of the procedure.  PLAN: Aspirin 81 mg by mouth daily.  High intensity statin therapy.  Saphenous vein mapping today.  Plan right common femoral to below-knee popliteal artery bypass with me.  Rande Brunt. Lenell Antu, MD [facility] Phone Number: [phone] 03/06/2023 10:38 AM

## 2023-03-09 ENCOUNTER — Encounter (HOSPITAL_COMMUNITY): Payer: Self-pay | Admitting: Vascular Surgery

## 2023-03-09 DIAGNOSIS — E46 Unspecified protein-calorie malnutrition: Secondary | ICD-10-CM | POA: Insufficient documentation

## 2023-03-09 NOTE — Progress Notes (Unsigned)
08/19/1998 send 24 Cardiology Office Note:    Date:  03/10/2023   ID:  [name] [name], DOB Nov 13, 1955, MRN [id number]  PCP:  [name], PA-C  Cardiologist:  Norman Herrlich, MD    Referring MD: [name], PA-C    ASSESSMENT:    1. Atypical atrial flutter (HCC)   2. On amiodarone therapy   3. Chronic anticoagulation   4. ESRD (end stage renal disease) on dialysis (HCC)   5. Hypertensive heart disease with heart failure (HCC)   6. Hypotension, unspecified hypotension type   7. Coronary artery disease involving native coronary artery of native heart without angina pectoris    PLAN:    In order of problems listed above:  Today he is in atypical atrial flutter controlled ventricular rate his anticoagulation will be disrupted I think we should continue his current amiodarone which controls his ventricular rate and we can address concerns of cardioversion with a postoperative coagulation for 1 month Stable end-stage renal disease he is fluid overloaded he will increase his diuretic to twice daily Diuresis mildly decompensated see above Is requiring midodrine to support his blood pressure continue the same through the perioperative period Stable CAD following CABG   Next appointment: 3 months   Medication Adjustments/Labs and Tests Ordered: Current medicines are reviewed at length with the patient today.  Concerns regarding medicines are outlined above.  Orders Placed This Encounter  Procedures   EKG 12-Lead   No orders of the defined types were placed in this encounter.    History of Present Illness:    [name] [name] is a 67 y.o. male with a hx of complex heart disease including atypical atrial flutter maintaining sinus rhythm with amiodarone chronic anticoagulation CAD with CABG hypertensive heart disease with heart failure hypotension requiring midodrine therapy and end-stage kidney disease with maintenance hemodialysis last seen 08/19/2022.  Compliance with diet,  lifestyle and medications: Yes  He is now taking both clopidogrel and Eliquis and complains of easy bruising He is more edematous and says he has missed several episodes of dialysis with hospitalizations He had dialysis yesterday and said he is at his dry weight but he is edematous He not having chest pain shortness of breath palpitation or syncope He is awaiting scheduling of his vascular surgery He tells me he was told he is in atrial fibrillation during the hospitalization Past Medical History:  Diagnosis Date   Acute kidney failure, unspecified (HCC) 12/05/2021   Anemia 04/17/2022   Blood transfusion   Atherosclerotic heart disease    Benign essential HTN 07/30/2014   Bradycardia 01/25/2015   CHF (congestive heart failure) (HCC)    Chronic anticoagulation 02/25/2018   Chronic systolic CHF (congestive heart failure), NYHA class 3 (HCC) 08/30/2014   Overview:  Global ef 30%   CKD (chronic kidney disease)    Class 3 severe obesity in adult (HCC) 04/28/2017   Coronary artery disease    Diabetes mellitus without complication (HCC)    Diabetic neuropathy (HCC) 07/28/2014   Dysrhythmia    Erectile dysfunction    GERD (gastroesophageal reflux disease)    High risk medication use 05/26/2018   Hypertensive heart disease with heart failure (HCC) 07/30/2014   Leukocytosis 11/26/2021   Lumbago    LV dysfunction 04/28/2017   Mixed hyperlipidemia 07/28/2014   OSA (obstructive sleep apnea)    CPAP   Paroxysmal atrial fibrillation (HCC) 07/28/2014   Postop check 01/06/2022   S/P CABG x 3 11/12/2021   Sinus node dysfunction (HCC) 04/21/2016  Testicular hypofunction    Uncontrolled type 2 diabetes mellitus with microalbuminuric diabetic nephropathy 07/28/2014    Current Medications: Current Meds  Medication Sig   albuterol (VENTOLIN HFA) 108 (90 Base) MCG/ACT inhaler Inhale 2 puffs into the lungs every 6 (six) hours as needed for wheezing or shortness of breath.   amiodarone  (PACERONE) 200 MG tablet Take 1 tablet (200 mg total) by mouth 2 (two) times daily.   apixaban (ELIQUIS) 5 MG TABS tablet Take 1 tablet (5 mg total) by mouth 2 (two) times daily.   [START ON 03/16/2023] atorvastatin (LIPITOR) 80 MG tablet Take 1 tablet (80 mg total) by mouth daily.   B Complex-C-Folic Acid (DIALYVITE 800) 0.8 MG TABS Take 0.8 mg by mouth daily.   clopidogrel (PLAVIX) 75 MG tablet Take 1 tablet (75 mg total) by mouth daily.   Dulaglutide (TRULICITY) 3 MG/0.5ML SOPN Inject 3 mg into the skin every Wednesday.   fluticasone furoate-vilanterol (BREO ELLIPTA) 200-25 MCG/ACT AEPB Inhale 1 puff into the lungs daily.   insulin glargine (LANTUS) 100 UNIT/ML injection Inject 0.2 mLs (20 Units total) into the skin 2 (two) times daily.   Insulin Pen Needle 32G X 4 MM MISC Use to inject Levemir 2 (two) times daily.   Lactobacillus (ACIDOPHILUS) CAPS capsule Take 2 capsules by mouth daily.   Methoxy PEG-Epoetin Beta (MIRCERA IJ) Mircera   midodrine (PROAMATINE) 5 MG tablet Take 1 tablet (5 mg total) by mouth daily.   oxyCODONE-acetaminophen (PERCOCET/ROXICET) 5-325 MG tablet Take 1 tablet by mouth every 6 (six) hours as needed for severe pain.   promethazine (PHENERGAN) 12.5 MG tablet Take 12.5 mg by mouth 3 (three) times daily as needed.   torsemide (DEMADEX) 20 MG tablet Take 2 tablets (40 mg total) by mouth daily.   Current Facility-Administered Medications for the 03/10/23 encounter (Office Visit) with Baldo Daub, MD  Medication   0.9 %  sodium chloride infusion   sodium chloride flush (NS) 0.9 % injection 3 mL      EKGs/Labs/Other Studies Reviewed:    The following studies were reviewed today:  Cardiac Studies & Procedures   CARDIAC CATHETERIZATION  CARDIAC CATHETERIZATION 10/24/2021  Narrative   Ost LM to Mid LM lesion is 50% stenosed.   Dist LM lesion is 75% stenosed.   Ost LAD to Prox LAD lesion is 50% stenosed.  Ectatic segment with eccentric calcium.   Ost Cx to Prox  Cx lesion is 70% stenosed.   Mid LAD lesion is 50% stenosed.   Prox RCA to Mid RCA lesion is 70% stenosed.   LV end diastolic pressure is normal.   There is no aortic valve stenosis.  Eccentric, severely calcified left main disease extending into the ostial LAD.  There is also ostial disease and a relatively small circumflex vessel.  Diffuse, calcific disease in the mid RCA today.  Patient was initially scheduled for A-fib ablation.  Plan now for cardiac surgery consult with consideration of CABG, maze and left atrial appendage clip.  Findings Coronary Findings Diagnostic  Dominance: Right  Left Main Ost LM to Mid LM lesion is 50% stenosed. The lesion is eccentric. Dist LM lesion is 75% stenosed. The lesion is eccentric.  Left Anterior Descending Ost LAD to Prox LAD lesion is 50% stenosed. The lesion is severely calcified. Mid LAD lesion is 50% stenosed.  Left Circumflex Ost Cx to Prox Cx lesion is 70% stenosed.  Right Coronary Artery There is mild diffuse disease throughout the vessel. Prox RCA to  Mid RCA lesion is 70% stenosed. The lesion is eccentric. The lesion is severely calcified.  Intervention  No interventions have been documented.     ECHOCARDIOGRAM  ECHOCARDIOGRAM COMPLETE 11/13/2021  Narrative ECHOCARDIOGRAM REPORT    Patient Name:   [name] [name] Date of Exam: 11/13/2021 Medical Rec #:  [id number]    Height:       70.0 in Accession #:    [id number]   Weight:       295.6 lb Date of Birth:  1956-06-04   BSA:          2.464 m Patient Age:    69 years     BP:           126/62 mmHg Patient Gender: M            HR:           87 bpm. Exam Location:  Inpatient  Procedure: 2D Echo, Cardiac Doppler, Color Doppler and Intracardiac Opacification Agent  Indications:    CHF  History:        Patient has prior history of Echocardiogram examinations, most recent 07/30/2021. CHF, Arrythmias:Bradycardia; Risk Factors:Hypertension, Dyslipidemia and Diabetes. S/p CABG x  3.  Sonographer:    Neomia Dear RDCS Referring Phys: [id number] [facility]   Sonographer Comments: Patient is morbidly obese and Technically difficult study due to poor echo windows. Image acquisition challenging due to patient body habitus. IMPRESSIONS   1. Left ventricular ejection fraction, by estimation, is 55 to 60%. The left ventricle has normal function. Left ventricular endocardial border not optimally defined to evaluate regional wall motion. Left ventricular diastolic parameters are consistent with Grade II diastolic dysfunction (pseudonormalization). 2. Peak RV-RA gradient 28 mmHg. Right ventricular systolic function was not well visualized. The right ventricular size is not well visualized. 3. The mitral valve was not well visualized. No evidence of mitral valve regurgitation. No evidence of mitral stenosis. 4. The aortic valve is tricuspid. There is mild calcification of the aortic valve. Aortic valve regurgitation is not visualized. Aortic valve sclerosis is present, with no evidence of aortic valve stenosis. 5. No significant pericardial effusion. 6. The heart is poorly visualized. Grossly, LV systolic function appears to be in the normal range.  FINDINGS Left Ventricle: Left ventricular ejection fraction, by estimation, is 55 to 60%. The left ventricle has normal function. Left ventricular endocardial border not optimally defined to evaluate regional wall motion. The left ventricular internal cavity size was normal in size. There is no left ventricular hypertrophy. Left ventricular diastolic parameters are consistent with Grade II diastolic dysfunction (pseudonormalization).  Right Ventricle: Peak RV-RA gradient 28 mmHg. The right ventricular size is not well visualized. Right vetricular wall thickness was not well visualized. Right ventricular systolic function was not well visualized.  Left Atrium: Left atrial size was not well visualized.  Right Atrium: Right atrial  size was not well visualized.  Pericardium: There is no evidence of pericardial effusion.  Mitral Valve: The mitral valve was not well visualized. No evidence of mitral valve regurgitation. No evidence of mitral valve stenosis. MV peak gradient, 9.1 mmHg. The mean mitral valve gradient is 3.0 mmHg.  Tricuspid Valve: The tricuspid valve is normal in structure. Tricuspid valve regurgitation is trivial.  Aortic Valve: The aortic valve is tricuspid. There is mild calcification of the aortic valve. Aortic valve regurgitation is not visualized. Aortic valve sclerosis is present, with no evidence of aortic valve stenosis. Aortic valve mean gradient measures 2.5 mmHg. Aortic valve peak  gradient measures 4.8 mmHg. Aortic valve area, by VTI measures 6.12 cm.  Pulmonic Valve: The pulmonic valve was normal in structure. Pulmonic valve regurgitation is not visualized.  Aorta: The aortic root is normal in size and structure.  Venous: The inferior vena cava was not well visualized.  IAS/Shunts: The interatrial septum was not well visualized.   LEFT VENTRICLE PLAX 2D LVIDd:         5.10 cm   Diastology LVIDs:         4.00 cm   LV e' medial:    5.92 cm/s LV PW:         1.20 cm   LV E/e' medial:  27.2 LV IVS:        1.10 cm   LV e' lateral:   7.22 cm/s LVOT diam:     2.60 cm   LV E/e' lateral: 22.3 LV SV:         102 LV SV Index:   42 LVOT Area:     5.31 cm   RIGHT VENTRICLE RV S prime:     10.10 cm/s  LEFT ATRIUM         Index LA diam:    2.70 cm 1.10 cm/m AORTIC VALVE                    PULMONIC VALVE AV Area (Vmax):    5.36 cm     PV Vmax:       1.02 m/s AV Area (Vmean):   5.17 cm     PV Vmean:      67.500 cm/s AV Area (VTI):     6.12 cm     PV VTI:        0.144 m AV Vmax:           109.00 cm/s  PV Peak grad:  4.2 mmHg AV Vmean:          74.650 cm/s  PV Mean grad:  2.0 mmHg AV VTI:            0.168 m AV Peak Grad:      4.8 mmHg AV Mean Grad:      2.5 mmHg LVOT Vmax:          110.00 cm/s LVOT Vmean:        72.700 cm/s LVOT VTI:          0.193 m LVOT/AV VTI ratio: 1.15  AORTA Ao Root diam: 3.70 cm Ao Asc diam:  3.60 cm  MITRAL VALVE                TRICUSPID VALVE MV Area (PHT): 3.91 cm     TR Peak grad:   28.5 mmHg MV Area VTI:   2.44 cm     TR Vmax:        267.00 cm/s MV Peak grad:  9.1 mmHg MV Mean grad:  3.0 mmHg     SHUNTS MV Vmax:       1.51 m/s     Systemic VTI:  0.19 m MV Vmean:      80.3 cm/s    Systemic Diam: 2.60 cm MV Decel Time: 194 msec MV E velocity: 161.00 cm/s MV A velocity: 52.60 cm/s MV E/A ratio:  3.06  Dalton McleanMD Electronically signed by Wilfred Lacy Signature Date/Time: 11/13/2021/3:42:40 PM    Final   TEE  ECHO TEE 11/21/2021  Narrative TRANSESOPHOGEAL ECHO REPORT    Patient Name:   [name] [name] Date of Exam:  11/21/2021 Medical Rec #:  [id number]    Height:       70.0 in Accession #:    [id number]   Weight:       257.6 lb Date of Birth:  1955/07/09   BSA:          2.324 m Patient Age:    89 years     BP:           97/60 mmHg Patient Gender: M            HR:           83 bpm. Exam Location:  Inpatient  Procedure: Transesophageal Echo, Color Doppler and Saline Contrast Bubble Study  Indications:     I48.91* Unspeicified atrial fibrillation  History:         Patient has prior history of Echocardiogram examinations, most recent 11/13/2021. CHF, CAD, Arrythmias:Bradycardia and Atrial Fibrillation; Risk Factors:Dyslipidemia, Hypertension and Diabetes.  Sonographer:     Eulah Pont RDCS Referring Phys:  [id number] [facility] Diagnosing Phys: Wilfred Lacy  PROCEDURE: After discussion of the risks and benefits of a TEE, an informed consent was obtained from the patient. The transesophogeal probe was passed without difficulty through the esophogus of the patient. Sedation performed by different physician. The patient was monitored while under deep sedation. Anesthestetic sedation was provided  intravenously by Anesthesiology: 56.28mg  of Propofol. The patient's vital signs; including heart rate, blood pressure, and oxygen saturation; remained stable throughout the procedure. The patient developed no complications during the procedure. A successful direct current cardioversion was performed at 200 joules with 1 attempt.  IMPRESSIONS   1. Left ventricular ejection fraction, by estimation, is 55 to 60%. The left ventricle has normal function. The left ventricle has no regional wall motion abnormalities but there is septal bounce suggestive of prior cardiac surgery. 2. Right ventricular systolic function is mildly reduced. The right ventricular size is normal. Tricuspid regurgitation signal is inadequate for assessing PA pressure. 3. Left atrial size was mildly dilated. No left atrial/left atrial appendage thrombus was detected. 4. There is a PFO by color doppler and bubble study. 5. The mitral valve is normal in structure. Mild mitral valve regurgitation. No evidence of mitral stenosis. 6. The aortic valve is tricuspid. There is mild calcification of the aortic valve. Aortic valve regurgitation is not visualized. No aortic stenosis is present. 7. A small pericardial effusion is present, primarily towards the apex.  FINDINGS Left Ventricle: Left ventricular ejection fraction, by estimation, is 55 to 60%. The left ventricle has normal function. The left ventricle has no regional wall motion abnormalities. The left ventricular internal cavity size was normal in size. There is no left ventricular hypertrophy.  Right Ventricle: The right ventricular size is normal. No increase in right ventricular wall thickness. Right ventricular systolic function is mildly reduced. Tricuspid regurgitation signal is inadequate for assessing PA pressure.  Left Atrium: Left atrial size was mildly dilated. No left atrial/left atrial appendage thrombus was detected.  Right Atrium: Right atrial size was normal in  size.  Pericardium: A small pericardial effusion is present.  Mitral Valve: The mitral valve is normal in structure. Mild mitral valve regurgitation. No evidence of mitral valve stenosis.  Tricuspid Valve: The tricuspid valve is normal in structure. Tricuspid valve regurgitation is trivial.  Aortic Valve: The aortic valve is tricuspid. There is mild calcification of the aortic valve. Aortic valve regurgitation is not visualized. No aortic stenosis is present.  Pulmonic Valve: The pulmonic valve was  normal in structure. Pulmonic valve regurgitation is not visualized.  Aorta: The aortic root is normal in size and structure.  IAS/Shunts: There is a PFO by color doppler and bubble study. Agitated saline contrast was given intravenously to evaluate for intracardiac shunting.  Dalton [employer] Electronically signed by Wilfred Lacy Signature Date/Time: 11/21/2021/3:32:50 PM    Final   MONITORS  LONG TERM MONITOR-LIVE TELEMETRY (3-14 DAYS) 02/17/2022  Narrative Patch Wear Time:  12 days and 10 hours (2023-07-31T11:42:52-0400 to 2023-08-12T22:32:43-0400)  Atrial Flutter occurred continuously (100% burden), ranging from 62-138 bpm (avg of 96 bpm).  There were 6 triggered events all atrial flutter with a rapid ventricular rate.  Isolated VEs were rare (<1.0%), VE Couplets were rare (<1.0%), and no VE Triplets were present.  Previously notified: MD notification criteria for First Documentation of Atrial Flutter met - notified Dema Severin RN on 20 Jan 2022 at 2:16 PM ET (RP).               Recent Labs: 02/05/2023: B Natriuretic Peptide 553.7; Magnesium 2.1; TSH 1.384 02/12/2023: ALT 442; Platelets 535 03/06/2023: BUN 57; Creatinine, Ser 6.50; Hemoglobin 8.8; Potassium 4.7; Sodium 142  Recent Lipid Panel No results found for: "CHOL", "TRIG", "HDL", "CHOLHDL", "VLDL", "LDLCALC", "LDLDIRECT"  Physical Exam:    VS:  BP (!) 110/50   Pulse 96   Ht 5\' 10"  (1.778 m)   Wt 253 lb (114.8 kg)    SpO2 93%   BMI 36.30 kg/m     Wt Readings from Last 3 Encounters:  03/10/23 253 lb (114.8 kg)  03/06/23 250 lb (113.4 kg)  03/03/23 248 lb (112.5 kg)     GEN:  Well nourished, well developed in no acute distress HEENT: Normal NECK: No JVD; No carotid bruits LYMPHATICS: No lymphadenopathy CARDIAC: Atrial flutter atypical controlled ventricular rate RRR, no murmurs, rubs, gallops RESPIRATORY:  Clear to auscultation without rales, wheezing or rhonchi  ABDOMEN: Soft, non-tender, non-distended MUSCULOSKELETAL:  No edema; No deformity  SKIN: Warm and dry NEUROLOGIC:  Alert and oriented x 3 PSYCHIATRIC:  Normal affect    Signed, Norman Herrlich, MD  03/10/2023 2:16 PM    [facility]

## 2023-03-10 ENCOUNTER — Ambulatory Visit: Payer: Medicare HMO | Attending: Cardiology | Admitting: Cardiology

## 2023-03-10 ENCOUNTER — Encounter: Payer: Self-pay | Admitting: Cardiology

## 2023-03-10 ENCOUNTER — Telehealth: Payer: Self-pay

## 2023-03-10 VITALS — BP 110/50 | HR 96 | Ht 70.0 in | Wt 253.0 lb

## 2023-03-10 DIAGNOSIS — T8744 Infection of amputation stump, left lower extremity: Secondary | ICD-10-CM | POA: Diagnosis not present

## 2023-03-10 DIAGNOSIS — Z7901 Long term (current) use of anticoagulants: Secondary | ICD-10-CM

## 2023-03-10 DIAGNOSIS — I110 Hypertensive heart disease with heart failure: Secondary | ICD-10-CM | POA: Diagnosis not present

## 2023-03-10 DIAGNOSIS — I5181 Takotsubo syndrome: Secondary | ICD-10-CM | POA: Diagnosis not present

## 2023-03-10 DIAGNOSIS — Z79899 Other long term (current) drug therapy: Secondary | ICD-10-CM

## 2023-03-10 DIAGNOSIS — N186 End stage renal disease: Secondary | ICD-10-CM

## 2023-03-10 DIAGNOSIS — Z89512 Acquired absence of left leg below knee: Secondary | ICD-10-CM | POA: Diagnosis not present

## 2023-03-10 DIAGNOSIS — Z992 Dependence on renal dialysis: Secondary | ICD-10-CM

## 2023-03-10 DIAGNOSIS — I484 Atypical atrial flutter: Secondary | ICD-10-CM | POA: Diagnosis not present

## 2023-03-10 DIAGNOSIS — E1151 Type 2 diabetes mellitus with diabetic peripheral angiopathy without gangrene: Secondary | ICD-10-CM | POA: Diagnosis not present

## 2023-03-10 DIAGNOSIS — I5042 Chronic combined systolic (congestive) and diastolic (congestive) heart failure: Secondary | ICD-10-CM | POA: Diagnosis not present

## 2023-03-10 DIAGNOSIS — I70222 Atherosclerosis of native arteries of extremities with rest pain, left leg: Secondary | ICD-10-CM | POA: Diagnosis not present

## 2023-03-10 DIAGNOSIS — I959 Hypotension, unspecified: Secondary | ICD-10-CM

## 2023-03-10 DIAGNOSIS — D62 Acute posthemorrhagic anemia: Secondary | ICD-10-CM | POA: Diagnosis not present

## 2023-03-10 DIAGNOSIS — E1140 Type 2 diabetes mellitus with diabetic neuropathy, unspecified: Secondary | ICD-10-CM | POA: Diagnosis not present

## 2023-03-10 DIAGNOSIS — I2510 Atherosclerotic heart disease of native coronary artery without angina pectoris: Secondary | ICD-10-CM

## 2023-03-10 MED ORDER — TORSEMIDE 40 MG PO TABS: 40.0000 mg | ORAL_TABLET | Freq: Two times a day (BID) | ORAL | 3 refills | Status: DC

## 2023-03-10 NOTE — Telephone Encounter (Signed)
April, RN with [facility] called stating that the pt missed his wound vac change appt yesterday d/t conflict with HD. She went back today and changed the wound vac and will go back on Friday. She is also requesting a change to his R heel wound care orders since he has an area approx 3x3 with eschar to medihoney.  Reviewed pt's chart, returned call for clarification, two identifiers used. Verbal orders given.

## 2023-03-10 NOTE — Patient Instructions (Signed)
Medication Instructions:  Your physician has recommended you make the following change in your medication:   START: Torsemide 40 mg twice daily  *If you need a refill on your cardiac medications before your next appointment, please call your pharmacy*   Lab Work: None If you have labs (blood work) drawn today and your tests are completely normal, you will receive your results only by: MyChart Message (if you have MyChart) OR A paper copy in the mail If you have any lab test that is abnormal or we need to change your treatment, we will call you to review the results.   Testing/Procedures: None   Follow-Up: At [facility], you and your health needs are our priority.  As part of our continuing mission to provide you with exceptional heart care, we have created designated Provider Care Teams.  These Care Teams include your primary Cardiologist (physician) and Advanced Practice Providers (APPs -  Physician Assistants and Nurse Practitioners) who all work together to provide you with the care you need, when you need it.  We recommend signing up for the patient portal called "MyChart".  Sign up information is provided on this After Visit Summary.  MyChart is used to connect with patients for Virtual Visits (Telemedicine).  Patients are able to view lab/test results, encounter notes, upcoming appointments, etc.  Non-urgent messages can be sent to your provider as well.   To learn more about what you can do with MyChart, go to [email or web address].    Your next appointment:   3 month(s)  Provider:   Norman Herrlich, MD    Other Instructions None

## 2023-03-11 ENCOUNTER — Other Ambulatory Visit: Payer: Self-pay

## 2023-03-11 ENCOUNTER — Other Ambulatory Visit: Payer: Self-pay | Admitting: Cardiology

## 2023-03-11 DIAGNOSIS — I70239 Atherosclerosis of native arteries of right leg with ulceration of unspecified site: Secondary | ICD-10-CM

## 2023-03-11 MED ORDER — TORSEMIDE 20 MG PO TABS: 20.0000 mg | ORAL_TABLET | Freq: Two times a day (BID) | ORAL | 1 refills | Status: DC

## 2023-03-12 ENCOUNTER — Telehealth: Payer: Self-pay

## 2023-03-12 DIAGNOSIS — I70222 Atherosclerosis of native arteries of extremities with rest pain, left leg: Secondary | ICD-10-CM | POA: Diagnosis not present

## 2023-03-12 DIAGNOSIS — E1151 Type 2 diabetes mellitus with diabetic peripheral angiopathy without gangrene: Secondary | ICD-10-CM | POA: Diagnosis not present

## 2023-03-12 DIAGNOSIS — I110 Hypertensive heart disease with heart failure: Secondary | ICD-10-CM | POA: Diagnosis not present

## 2023-03-12 DIAGNOSIS — T8744 Infection of amputation stump, left lower extremity: Secondary | ICD-10-CM | POA: Diagnosis not present

## 2023-03-12 DIAGNOSIS — I5042 Chronic combined systolic (congestive) and diastolic (congestive) heart failure: Secondary | ICD-10-CM | POA: Diagnosis not present

## 2023-03-12 DIAGNOSIS — E1140 Type 2 diabetes mellitus with diabetic neuropathy, unspecified: Secondary | ICD-10-CM | POA: Diagnosis not present

## 2023-03-12 DIAGNOSIS — I5181 Takotsubo syndrome: Secondary | ICD-10-CM | POA: Diagnosis not present

## 2023-03-12 DIAGNOSIS — Z89512 Acquired absence of left leg below knee: Secondary | ICD-10-CM | POA: Diagnosis not present

## 2023-03-12 DIAGNOSIS — D62 Acute posthemorrhagic anemia: Secondary | ICD-10-CM | POA: Diagnosis not present

## 2023-03-12 NOTE — Telephone Encounter (Signed)
Amy, PT with [facility] called stating that when she arrived to the pt's home for therapy, she found that he had slid from his recliner to the floor. She called EMS to help assist him up. She reports no new injuries and no new pain.

## 2023-03-13 DIAGNOSIS — T8744 Infection of amputation stump, left lower extremity: Secondary | ICD-10-CM | POA: Diagnosis not present

## 2023-03-13 DIAGNOSIS — D62 Acute posthemorrhagic anemia: Secondary | ICD-10-CM | POA: Diagnosis not present

## 2023-03-13 DIAGNOSIS — I110 Hypertensive heart disease with heart failure: Secondary | ICD-10-CM | POA: Diagnosis not present

## 2023-03-13 DIAGNOSIS — I70222 Atherosclerosis of native arteries of extremities with rest pain, left leg: Secondary | ICD-10-CM | POA: Diagnosis not present

## 2023-03-13 DIAGNOSIS — E1140 Type 2 diabetes mellitus with diabetic neuropathy, unspecified: Secondary | ICD-10-CM | POA: Diagnosis not present

## 2023-03-13 DIAGNOSIS — E1151 Type 2 diabetes mellitus with diabetic peripheral angiopathy without gangrene: Secondary | ICD-10-CM | POA: Diagnosis not present

## 2023-03-13 DIAGNOSIS — I5181 Takotsubo syndrome: Secondary | ICD-10-CM | POA: Diagnosis not present

## 2023-03-13 DIAGNOSIS — Z89512 Acquired absence of left leg below knee: Secondary | ICD-10-CM | POA: Diagnosis not present

## 2023-03-13 DIAGNOSIS — I5042 Chronic combined systolic (congestive) and diastolic (congestive) heart failure: Secondary | ICD-10-CM | POA: Diagnosis not present

## 2023-03-16 DIAGNOSIS — D62 Acute posthemorrhagic anemia: Secondary | ICD-10-CM | POA: Diagnosis not present

## 2023-03-16 DIAGNOSIS — I70222 Atherosclerosis of native arteries of extremities with rest pain, left leg: Secondary | ICD-10-CM | POA: Diagnosis not present

## 2023-03-16 DIAGNOSIS — Z89512 Acquired absence of left leg below knee: Secondary | ICD-10-CM | POA: Diagnosis not present

## 2023-03-16 DIAGNOSIS — I110 Hypertensive heart disease with heart failure: Secondary | ICD-10-CM | POA: Diagnosis not present

## 2023-03-16 DIAGNOSIS — I5042 Chronic combined systolic (congestive) and diastolic (congestive) heart failure: Secondary | ICD-10-CM | POA: Diagnosis not present

## 2023-03-16 DIAGNOSIS — T8744 Infection of amputation stump, left lower extremity: Secondary | ICD-10-CM | POA: Diagnosis not present

## 2023-03-16 DIAGNOSIS — E1140 Type 2 diabetes mellitus with diabetic neuropathy, unspecified: Secondary | ICD-10-CM | POA: Diagnosis not present

## 2023-03-16 DIAGNOSIS — E1151 Type 2 diabetes mellitus with diabetic peripheral angiopathy without gangrene: Secondary | ICD-10-CM | POA: Diagnosis not present

## 2023-03-16 DIAGNOSIS — I5181 Takotsubo syndrome: Secondary | ICD-10-CM | POA: Diagnosis not present

## 2023-03-17 DIAGNOSIS — E1140 Type 2 diabetes mellitus with diabetic neuropathy, unspecified: Secondary | ICD-10-CM | POA: Diagnosis not present

## 2023-03-17 DIAGNOSIS — E1151 Type 2 diabetes mellitus with diabetic peripheral angiopathy without gangrene: Secondary | ICD-10-CM | POA: Diagnosis not present

## 2023-03-17 DIAGNOSIS — I5042 Chronic combined systolic (congestive) and diastolic (congestive) heart failure: Secondary | ICD-10-CM | POA: Diagnosis not present

## 2023-03-17 DIAGNOSIS — Z89512 Acquired absence of left leg below knee: Secondary | ICD-10-CM | POA: Diagnosis not present

## 2023-03-17 DIAGNOSIS — I5181 Takotsubo syndrome: Secondary | ICD-10-CM | POA: Diagnosis not present

## 2023-03-17 DIAGNOSIS — I110 Hypertensive heart disease with heart failure: Secondary | ICD-10-CM | POA: Diagnosis not present

## 2023-03-17 DIAGNOSIS — I70222 Atherosclerosis of native arteries of extremities with rest pain, left leg: Secondary | ICD-10-CM | POA: Diagnosis not present

## 2023-03-17 DIAGNOSIS — D62 Acute posthemorrhagic anemia: Secondary | ICD-10-CM | POA: Diagnosis not present

## 2023-03-17 DIAGNOSIS — T8744 Infection of amputation stump, left lower extremity: Secondary | ICD-10-CM | POA: Diagnosis not present

## 2023-03-18 ENCOUNTER — Encounter (HOSPITAL_COMMUNITY): Payer: Self-pay | Admitting: Vascular Surgery

## 2023-03-18 ENCOUNTER — Other Ambulatory Visit: Payer: Self-pay

## 2023-03-18 DIAGNOSIS — I5181 Takotsubo syndrome: Secondary | ICD-10-CM | POA: Diagnosis not present

## 2023-03-18 DIAGNOSIS — I5042 Chronic combined systolic (congestive) and diastolic (congestive) heart failure: Secondary | ICD-10-CM | POA: Diagnosis not present

## 2023-03-18 DIAGNOSIS — D62 Acute posthemorrhagic anemia: Secondary | ICD-10-CM | POA: Diagnosis not present

## 2023-03-18 DIAGNOSIS — I70222 Atherosclerosis of native arteries of extremities with rest pain, left leg: Secondary | ICD-10-CM | POA: Diagnosis not present

## 2023-03-18 DIAGNOSIS — T8744 Infection of amputation stump, left lower extremity: Secondary | ICD-10-CM | POA: Diagnosis not present

## 2023-03-18 DIAGNOSIS — I110 Hypertensive heart disease with heart failure: Secondary | ICD-10-CM | POA: Diagnosis not present

## 2023-03-18 DIAGNOSIS — E1151 Type 2 diabetes mellitus with diabetic peripheral angiopathy without gangrene: Secondary | ICD-10-CM | POA: Diagnosis not present

## 2023-03-18 DIAGNOSIS — E1140 Type 2 diabetes mellitus with diabetic neuropathy, unspecified: Secondary | ICD-10-CM | POA: Diagnosis not present

## 2023-03-18 DIAGNOSIS — Z89512 Acquired absence of left leg below knee: Secondary | ICD-10-CM | POA: Diagnosis not present

## 2023-03-18 NOTE — Progress Notes (Addendum)
SDW CALL  Patient was given pre-op instructions over the phone. The opportunity was given for the patient to ask questions. No further questions asked. Patient verbalized understanding of instructions given.   PCP - [name] PA Cardiologist - Norman Herrlich, MD  PPM/ICD - denies Device Orders -  Rep Notified -   Chest x-ray - 02/05/23 EKG - 03/10/23 Stress Test - 08/11/14 ECHO - 07/30/21 Cardiac Cath - 10/24/21  Sleep Study - 2018? CPAP - yes  Fasting Blood Sugar - 125-130 Checks Blood Sugar  Last dose of Trulicity was 9/11 per pt report.   Blood Thinner Instructions:Pt reported he stopped Eliquis and Plavix on 03/13/23. Aspirin Instructions:na  ERAS Protcol ordered but pt unable to get to office to obtain drink and per secure chat discussion with Lucianne Lei RN in Dr. Verita Lamb office,pt was not to drink other clear liquids after midnight.  PRE-SURGERY Ensure or G2-   COVID TEST- no   Anesthesia review: yes- hx afib,CAD,CHF,sleep apnea  Patient denies shortness of breath, fever, cough and chest pain over the phone call    Surgical Instructions    Your procedure is scheduled on September 26  Report to [facility] Main Entrance "A" at 0750 A.M., then check in with the Admitting office.  Call this number if you have problems the morning of surgery:  [phone]    Remember:  Do not eat or drink  after midnight the night before your surgery    Take these medicines the morning of surgery with A SIP OF WATER:   As of today, STOP taking any Aspirin (unless otherwise instructed by your surgeon) Aleve, Naproxen, Ibuprofen, Motrin, Advil, Goody's, BC's, all herbal medications, fish oil, and all vitamins.  WHAT DO I DO ABOUT MY DIABETES MEDICATION?   Do not take oral diabetes medicines (pills) the morning of surgery.  THE NIGHT BEFORE SURGERY, take 10 units of glargine(Lantus)insulin.       THE MORNING OF SURGERY, take 10 units of glargine(Lantus) insulin.  DO  not take  Trulicity (dulaglutide) after 9/18 as instructed by your surgeon's office.     HOW TO MANAGE YOUR DIABETES BEFORE AND AFTER SURGERY  Check your blood sugar the morning of your surgery when you wake up and every 2 hours until you get to the Short Stay unit.  If your blood sugar is less than 70 mg/dL, you will need to treat for low blood sugar: Do not take insulin. Treat a low blood sugar (less than 70 mg/dL) with  cup of clear juice (cranberry or apple), 4 glucose tablets, OR glucose gel. Recheck blood sugar in 15 minutes after treatment (to make sure it is greater than 70 mg/dL). If your blood sugar is not greater than 70 mg/dL on recheck, call [phone] for further instructions. Report your blood sugar to the short stay nurse when you get to Short Stay.   is not responsible for any belongings or valuables. .   Do NOT Smoke (Tobacco/Vaping)  24 hours prior to your procedure  If you use a CPAP at night, you may bring your mask for your overnight stay.   Contacts, glasses, hearing aids, dentures or partials may not be worn into surgery, please bring cases for these belongings   Patients discharged the day of surgery will not be allowed to drive home, and someone needs to stay with them for 24 hours.    Special instructions:    Oral Hygiene is also important to reduce your risk of  infection.  Remember - BRUSH YOUR TEETH THE MORNING OF SURGERY WITH YOUR REGULAR TOOTHPASTE   Day of Surgery:  Take a shower the day of or night before with antibacterial soap. Wear Clean/Comfortable clothing the morning of surgery Do not apply any deodorants/lotions.   Do not wear jewelry or makeup Do not wear lotions, powders, perfumes/colognes, or deodorant. Do not shave 48 hours prior to surgery.  Men may shave face and neck. Do not bring valuables to the hospital. Do not wear nail polish, gel polish, artificial nails, or any other type of covering on natural nails (fingers  and toes) If you have artificial nails or gel coating that need to be removed by a nail salon, please have this removed prior to surgery. Artificial nails or gel coating may interfere with anesthesia's ability to adequately monitor your vital signs. Remember to brush your teeth WITH YOUR REGULAR TOOTHPASTE.

## 2023-03-18 NOTE — Anesthesia Preprocedure Evaluation (Addendum)
Anesthesia Evaluation  Patient identified by MRN, date of birth, ID band Patient awake    Reviewed: Allergy & Precautions, NPO status , Patient's Chart, lab work & pertinent test results  History of Anesthesia Complications Negative for: history of anesthetic complications  Airway Mallampati: III  TM Distance: >3 FB Neck ROM: Full    Dental  (+) Dental Advisory Given, Poor Dentition   Pulmonary former smoker   breath sounds clear to auscultation       Cardiovascular hypertension, + CAD, + CABG, + Peripheral Vascular Disease and +CHF  + dysrhythmias Atrial Fibrillation  Rhythm:Regular  1. Left ventricular ejection fraction, by estimation, is 55 to 60%. The  left ventricle has normal function. The left ventricle has no regional  wall motion abnormalities but there is septal bounce suggestive of prior  cardiac surgery.   2. Right ventricular systolic function is mildly reduced. The right  ventricular size is normal. Tricuspid regurgitation signal is inadequate  for assessing PA pressure.   3. Left atrial size was mildly dilated. No left atrial/left atrial  appendage thrombus was detected.   4. There is a PFO by color doppler and bubble study.   5. The mitral valve is normal in structure. Mild mitral valve  regurgitation. No evidence of mitral stenosis.   6. The aortic valve is tricuspid. There is mild calcification of the  aortic valve. Aortic valve regurgitation is not visualized. No aortic  stenosis is present.   7. A small pericardial effusion is present, primarily towards the apex.     Ost LM to Mid LM lesion is 50% stenosed.   Dist LM lesion is 75% stenosed.   Ost LAD to Prox LAD lesion is 50% stenosed.  Ectatic segment with eccentric calcium.   Ost Cx to Prox Cx lesion is 70% stenosed.   Mid LAD lesion is 50% stenosed.   Prox RCA to Mid RCA lesion is 70% stenosed.   LV end diastolic pressure is normal.   There is  no aortic valve stenosis.   Eccentric, severely calcified left main disease extending into the ostial LAD.  There is also ostial disease and a relatively small circumflex vessel.  Diffuse, calcific disease in the mid RCA today.   Patient was initially scheduled for A-fib ablation.  Plan now for cardiac surgery consult with consideration of CABG, maze and left atrial appendage clip.     Neuro/Psych    GI/Hepatic ,GERD  ,,  Endo/Other  diabetes, Type 2, Insulin Dependent    Renal/GU Renal disease     Musculoskeletal   Abdominal   Peds  Hematology  (+) Blood dyscrasia, anemia Lab Results      Component                Value               Date                      WBC                      13.8 (H)            03/19/2023                HGB                      7.3 (L)             03/19/2023  HCT                      26.0 (L)            03/19/2023                MCV                      95.6                03/19/2023                PLT                      375                 03/19/2023              Anesthesia Other Findings   Reproductive/Obstetrics                              Anesthesia Physical Anesthesia Plan  ASA: 3  Anesthesia Plan: General   Post-op Pain Management:    Induction: Intravenous  PONV Risk Score and Plan: 2 and Ondansetron and Dexamethasone  Airway Management Planned: Oral ETT  Additional Equipment: Arterial line  Intra-op Plan:   Post-operative Plan: Extubation in OR  Informed Consent: I have reviewed the patients History and Physical, chart, labs and discussed the procedure including the risks, benefits and alternatives for the proposed anesthesia with the patient or authorized representative who has indicated his/her understanding and acceptance.     Dental advisory given  Plan Discussed with: CRNA  Anesthesia Plan Comments: (PAT note written 03/18/2023 by Shonna Chock, PA-C.  )         Anesthesia Quick Evaluation

## 2023-03-18 NOTE — Progress Notes (Addendum)
Anesthesia Chart Review: SAME DAY WORK-UP  Case: [id number] Date/Time: 03/19/23 1007   Procedure: RIGHT COMMON FEMORAL-BELOW KNEE POPLITEAL ARTERY BYPASS (Right)   Anesthesia type: General   Pre-op diagnosis: Atherosclerosis of native arteries of right lower extremity causing ulceration   [facility] / [facility]   Surgeons: Leonie Virgil, MD       DISCUSSION: Patient is a 67 year old male scheduled for the above procedure. S/p left BKA in 01/26/23, readmitted 02/05/23.-02/12/23 for stump site infection, s/p IV antibiotics and I&D, stump revision, and placement of Wound VAC 02/10/23. S/p RLE arteriogram on 03/06/23. Right CFA-below knee popliteal artery bypass recommended based on results.   History includes former smoker, CAD (s/p CABG: LIMA--LAD, SVG-OM1, SVG-PDA, LA MAZE Procedure/RF ablation 11/12/21; had post-operative left retroperitoneal hematoma, s/p aspiration/antibiotics, no growth on cultures; CKD->ESRD), chronic systolic CHF (in setting of afib with normalization of EF after 2016 DCCV; recurrent afib 05/09/21), atrial fibrillation/PAF (diagnosed ~ 07/2014; s/p DCCV 01/18/15, recurrent 05/09/21, s/p DCCV 11/2021 x4 & 02/27/22; recurrent 04/17/22, planned DCCV 04/24/22 but EKG showed NSR; aflutter 03/10/23), sinus node dysfunction/bradycardia, HTN, HLD, DM2 with neuropathy, PAD (laser arthrectomy/stents left SFA & Popliteal arteries 12/29/22; left BKA 01/26/23 with revision 02/10/23), obesity, OSA (uses CPAP), ESRD (CKD progressed to ESRD post CABG, started CRRT 11/15/21-11/20/21, HD started 11/23/21, right IJ [facility] 11/28/21; RUE AVF revision 07/10/22; undergoing 2x/week as of 01/20/23).   Last cardiology visit with Dr. Dulce Sellar was on 03/10/23. He was back in a-flutter with controlled rate. He was on both clopidogrel and Eliquis and reported easily bruising. He was notified of pending vascular surgery. He advised to continue amiodarone for rate control and will address consideration of cardioversion postoperatively  since anticoagulation will be interrupted for surgery. He remained on midodrine to BP support. CAD felt stable. Volume status managed by HD. Follow-up in 3 months planned.   Last EP follow-up with Dr. Elberta Fortis was on 01/20/23. Six month follow-up planned.    Per VVS: last Trulicity 03/11/23, last Plavix 03/13/23, last Eliquis 03/16/23. He reported last Trulicity 03/04/23 and last Plavix and Eliquis were 03/13/23.   He is a same day work-up. Anesthesia team to evaluate on the day of surgery.      VS:  BP Readings from Last 3 Encounters:  03/10/23 (!) 110/50  03/06/23 (!) 98/57  03/03/23 110/66   Pulse Readings from Last 3 Encounters:  03/10/23 96  03/06/23 89  03/03/23 93    PROVIDERS: [name], PA-C is PCP  Loman Brooklyn, MD is EP cardiologist Norman Herrlich, MD is cardiologist Marca Ancona, MD is HF cardiologist (consulted post CABG) Marcellus Scott, MD is pulmonologist Donata Clay, Theron Arista, MD is CT surgeon Asencion Islam, DPM is podiatrist He is also followed by [facility]. HD @ [facility].   LABS: For day of surgery. As of 03/06/23, H/H 8.8/26.0, glucose 82. A1c 5.9% 01/28/23.      PFTs 11/08/21: FVC 2.44 (53%), post 2.45 (54%). FEV1 1.88 (55%), post 1.98 (58%). DLCO unc 17.96 (67%), cor 18.63 (70%).     IMAGES: 1V PCXR 02/05/23: FINDINGS: Minimal left lung base atelectasis/scarring. No consolidative changes. There is no pleural effusion or pneumothorax. Stable cardiomegaly. Median sternotomy wires and CABG vascular clips. Atherosclerotic calcification of the aorta. No acute osseous pathology. IMPRESSION: 1. No active disease. 2. Stable cardiomegaly.     EKG:  EKG 03/10/23: atypical atrial flutter Left axis deviation Right bundle branch block When compared with ECG of 29-Dec-2022 05:40, Atrial fibrillation has replaced  Sinus rhythm Vent. rate has increased BY 36 BPM Confirmed by Norman Herrlich ([id number]) on 03/10/2023 2:36:18 PM  EKG 12/29/22: Sinus rhythm  with 1st degree A-V block Left axis deviation Right bundle branch block Abnormal ECG When compared with ECG of 24-Apr-2022 09:08, PREVIOUS ECG IS PRESENT Confirmed by Orpah Cobb ([id number]) on 01/01/2023 11:09:07 AM     CV: Long term monitor 01/20/22-02/01/22: - Patch Wear Time:  12 days and 10 hours (2023-07-31T11:42:52-0400 to 2023-08-12T22:32:43-0400) - Atrial Flutter occurred continuously (100% burden), ranging from 62-138 bpm (avg of 96 bpm).  - There were 6 triggered events all atrial flutter with a rapid ventricular rate. - Isolated VEs were rare (<1.0%), VE Couplets were rare (<1.0%), and no VE Triplets were present.  -  Previously notified: MD notification criteria for First Documentation of Atrial Flutter met - notified Dema Severin RN on 20 Jan 2022 at 2:16 PM ET (RP).     TEE 11/21/21: IMPRESSIONS   1. Left ventricular ejection fraction, by estimation, is 55 to 60%. The  left ventricle has normal function. The left ventricle has no regional  wall motion abnormalities but there is septal bounce suggestive of prior  cardiac surgery.   2. Right ventricular systolic function is mildly reduced. The right  ventricular size is normal. Tricuspid regurgitation signal is inadequate  for assessing PA pressure.   3. Left atrial size was mildly dilated. No left atrial/left atrial  appendage thrombus was detected.   4. There is a PFO by color doppler and bubble study.   5. The mitral valve is normal in structure. Mild mitral valve  regurgitation. No evidence of mitral stenosis.   6. The aortic valve is tricuspid. There is mild calcification of the  aortic valve. Aortic valve regurgitation is not visualized. No aortic  stenosis is present.   7. A small pericardial effusion is present, primarily towards the apex.      TTE 11/13/21: IMPRESSIONS   1. Left ventricular ejection fraction, by estimation, is 55 to 60%. The  left ventricle has normal function. Left ventricular endocardial border   not optimally defined to evaluate regional wall motion. Left ventricular  diastolic parameters are consistent  with Grade II diastolic dysfunction (pseudonormalization).   2. Peak RV-RA gradient 28 mmHg. Right ventricular systolic function was  not well visualized. The right ventricular size is not well visualized.   3. The mitral valve was not well visualized. No evidence of mitral valve  regurgitation. No evidence of mitral stenosis.   4. The aortic valve is tricuspid. There is mild calcification of the  aortic valve. Aortic valve regurgitation is not visualized. Aortic valve  sclerosis is present, with no evidence of aortic valve stenosis.   5. No significant pericardial effusion.   6. The heart is poorly visualized. Grossly, LV systolic function appears  to be in the normal range.  - Comparison LVEF 50-55% 07/30/21; 60-65% 04/12/18; 30% 08/11/14.      US Carotid 11/08/21: Summary:  - Right Carotid: Velocities in the right ICA are consistent with a 1-39%  stenosis.  - Left Carotid: Velocities in the left ICA are consistent with a 1-39%  stenosis.  - Vertebrals:  Bilateral vertebral arteries demonstrate antegrade flow.  - Subclavians: Normal flow hemodynamics were seen in bilateral subclavian               arteries.      LHC 10/24/21 (PRE-CABG):   Ost LM to Mid LM lesion is 50% stenosed.   Dist LM  lesion is 75% stenosed.   Ost LAD to Prox LAD lesion is 50% stenosed.  Ectatic segment with eccentric calcium.   Ost Cx to Prox Cx lesion is 70% stenosed.   Mid LAD lesion is 50% stenosed.   Prox RCA to Mid RCA lesion is 70% stenosed.   LV end diastolic pressure is normal.   There is no aortic valve stenosis.   - Eccentric, severely calcified left main disease extending into the ostial LAD.  There is also ostial disease and a relatively small circumflex vessel.  Diffuse, calcific disease in the mid RCA today. - Patient was initially scheduled for A-fib ablation.  Plan now for cardiac  surgery consult with consideration of CABG, maze and left atrial appendage clip.     CT Cardiac (ordered for anticipated ablation) 10/18/21: IMPRESSION: 1. There is normal pulmonary vein drainage into the left atrium. No pulmonary vein stenosis. 2. Normal left atrial appendage, no left atrial appendage thrombus. No intracardiac mass or thrombus. 3. The esophagus runs in the left atrial midline and is not in the proximity to any of the pulmonary veins. 4. Coronary artery calcium score is 4439, which is 99th percentile for age and sex matched peers. Dense 3 vessel coronary calcifications involving the proximal vessels including left main coronary artery. 5. Mild dilation of main pulmonary artery, 30 mm, may indicate increased pulmonary pressure. - Referred for LHC by Dr. Elberta Fortis.    Past Medical History:  Diagnosis Date   Acute kidney failure, unspecified (HCC) 12/05/2021   Anemia 04/17/2022   Blood transfusion   Atherosclerotic heart disease    Benign essential HTN 07/30/2014   Bradycardia 01/25/2015   CHF (congestive heart failure) (HCC)    Chronic anticoagulation 02/25/2018   Chronic systolic CHF (congestive heart failure), NYHA class 3 (HCC) 08/30/2014   Overview:  Global ef 30%   CKD (chronic kidney disease)    Class 3 severe obesity in adult [facility]) 04/28/2017   Coronary artery disease    Diabetes mellitus without complication (HCC)    Diabetic neuropathy (HCC) 07/28/2014   Dysrhythmia    Erectile dysfunction    GERD (gastroesophageal reflux disease)    High risk medication use 05/26/2018   Hypertensive heart disease with heart failure (HCC) 07/30/2014   Leukocytosis 11/26/2021   Lumbago    LV dysfunction 04/28/2017   Mixed hyperlipidemia 07/28/2014   OSA (obstructive sleep apnea)    CPAP   Paroxysmal atrial fibrillation (HCC) 07/28/2014   Postop check 01/06/2022   S/P CABG x 3 11/12/2021   Sinus node dysfunction (HCC) 04/21/2016   Testicular hypofunction     Uncontrolled type 2 diabetes mellitus with microalbuminuric diabetic nephropathy 07/28/2014    Past Surgical History:  Procedure Laterality Date   ABDOMINAL AORTOGRAM W/LOWER EXTREMITY N/A 12/29/2022   Procedure: ABDOMINAL AORTOGRAM W/LOWER EXTREMITY;  Surgeon: Maeola Harman, MD;  Location[facility] INVASIVE CV LAB;  Service: Cardiovascular;  Laterality: N/A;   ABDOMINAL AORTOGRAM W/LOWER EXTREMITY N/A 01/22/2023   Procedure: ABDOMINAL AORTOGRAM W/LOWER EXTREMITY;  Surgeon: [name], MD;  [facility]: [facility] INVASIVE CV LAB;  Service: Cardiovascular;  Laterality: N/A;   ABDOMINAL AORTOGRAM W/LOWER EXTREMITY N/A 03/06/2023   Procedure: ABDOMINAL AORTOGRAM W/LOWER EXTREMITY;  Surgeon: Leonie Kieren, MD;  Location: [facility] INVASIVE CV LAB;  Service: Cardiovascular;  Laterality: N/A;   AMPUTATION Left 01/26/2023   Procedure: LEFT AMPUTATION BELOW KNEE;  Surgeon: Leonie Tayvian, MD;  Location: [facility] OR;  Service: Vascular;  Laterality: Left;  with regional block  APPLICATION OF WOUND VAC Left 01/26/2023   Procedure: APPLICATION OF WOUND VAC, LEFT BELOW THE KNEE AMPUTATION STUMP;  Surgeon: Leonie Ananias, MD;  Location: [facility] OR;  Service: Vascular;  Laterality: Left;  With regional block   APPLICATION OF WOUND VAC Left 02/10/2023   Procedure: APPLICATION OF WOUND VAC;  Surgeon: Chuck Hint, MD;  Location: [facility] OR;  Service: Vascular;  Laterality: Left;   AV FISTULA PLACEMENT Right 05/20/2022   Procedure: RIGHT ARM ARTERIOVENOUS (AV) FISTULA CREATION;  Surgeon: Larina Earthly, MD;  Location: [facility] OR;  Service: Vascular;  Laterality: Right;   BUBBLE STUDY  11/21/2021   Procedure: BUBBLE STUDY;  Surgeon: Laurey Morale, MD;  Location: [facility] ENDOSCOPY;  Service: Cardiovascular;;   CARDIAC CATHETERIZATION     CARDIOVERSION N/A 11/21/2021   Procedure: CARDIOVERSION;  Surgeon: Laurey Morale, MD;  Location: [facility] ENDOSCOPY;  Service: Cardiovascular;  Laterality: N/A;   CARDIOVERSION N/A 11/25/2021    Procedure: CARDIOVERSION;  Surgeon: Laurey Morale, MD;  Location: [facility] ENDOSCOPY;  Service: Cardiovascular;  Laterality: N/A;   CARDIOVERSION N/A 11/29/2021   Procedure: CARDIOVERSION;  Surgeon: Laurey Morale, MD;  Location: [facility] ENDOSCOPY;  Service: Cardiovascular;  Laterality: N/A;   CARDIOVERSION N/A 02/27/2022   Procedure: CARDIOVERSION;  Surgeon: Little Ishikawa, MD;  Location: [facility] ENDOSCOPY;  Service: Cardiovascular;  Laterality: N/A;   CORONARY ARTERY BYPASS GRAFT N/A 11/12/2021   Procedure: CORONARY ARTERY BYPASS GRAFTING (CABG) x  3 USING LEFT INTERNAL MAMMARY ARTERY AND RIGHT GREATER SAPHENOUS VEIN;  Surgeon: Lovett Sox, MD;  Location[facility] OR;  Service: Open Heart Surgery;  Laterality: N/A;   ELECTROPHYSIOLOGIC STUDY N/A 01/18/2015   Procedure: CARDIOVERSION;  Surgeon: Lamar Blinks, MD;  Location: [facility] ORS;  Service: Cardiovascular;  Laterality: N/A;   ENDOVEIN HARVEST OF GREATER SAPHENOUS VEIN  11/12/2021   Procedure: ENDOVEIN HARVEST OF GREATER SAPHENOUS VEIN;  Surgeon: Lovett Sox, MD;  Location[facility] OR;  Service: Open Heart Surgery;;   EYE SURGERY Bilateral 2022   FISTULA SUPERFICIALIZATION Right 07/10/2022   Procedure: RIGHT ARM ARTERIOVENOUS FISTULA SUPERFICIALIZATION;  Surgeon: Maeola Harman, MD;  Location: [facility] OR;  Service: Vascular;  Laterality: Right;   IR FLUORO GUIDE CV LINE RIGHT  11/28/2021   IR US GUIDE VASC ACCESS RIGHT  11/28/2021   LEFT HEART CATH AND CORONARY ANGIOGRAPHY N/A 10/24/2021   Procedure: LEFT HEART CATH AND CORONARY ANGIOGRAPHY;  Surgeon: Corky Crafts, MD;  [facility] INVASIVE CV LAB;  Service: Cardiovascular;  Laterality: N/A;   MAZE N/A 11/12/2021   Procedure: MAZE;  Surgeon: Lovett Sox, MD;  Location: [facility] OR;  Service: Open Heart Surgery;  Laterality: N/A;   PERIPHERAL VASCULAR ATHERECTOMY  12/29/2022   Procedure: PERIPHERAL VASCULAR ATHERECTOMY;  Surgeon: Maeola Harman, MD;  Location: [facility] INVASIVE CV  LAB;  Service: Cardiovascular;;   PERIPHERAL VASCULAR INTERVENTION  12/29/2022   Procedure: PERIPHERAL VASCULAR INTERVENTION;  Surgeon: Maeola Harman, MD;  Location: [facility] INVASIVE CV LAB;  Service: Cardiovascular;;  Left SFA, Bilateral illiac   PERIPHERAL VASCULAR INTERVENTION Right 03/06/2023   Procedure: PERIPHERAL VASCULAR INTERVENTION;  Surgeon: Leonie Hakan, MD;  [facility] INVASIVE CV LAB;  Service: Cardiovascular;  Laterality: Right;  SFA   STUMP REVISION Left 02/10/2023   Procedure: LEFT BELOW KNEE AMPUTATION REVISION;  Surgeon: Chuck Hint, MD;  Location: [facility] OR;  Service: Vascular;  Laterality: Left;   TEE WITHOUT CARDIOVERSION N/A 11/12/2021   Procedure: TRANSESOPHAGEAL ECHOCARDIOGRAM (TEE);  Surgeon: Lovett Sox, MD;  Location[facility] OR;  Service: Open Heart Surgery;  Laterality: N/A;   TEE WITHOUT CARDIOVERSION N/A 11/21/2021   Procedure: TRANSESOPHAGEAL ECHOCARDIOGRAM (TEE);  Surgeon: Laurey Morale, MD;  Location: [facility] ENDOSCOPY;  Service: Cardiovascular;  Laterality: N/A;    MEDICATIONS:  0.9 %  sodium chloride infusion   sodium chloride flush (NS) 0.9 % injection 3 mL    albuterol (VENTOLIN HFA) 108 (90 Base) MCG/ACT inhaler   amiodarone (PACERONE) 200 MG tablet   apixaban (ELIQUIS) 5 MG TABS tablet   atorvastatin (LIPITOR) 80 MG tablet   B Complex-C-Folic Acid (DIALYVITE 800) 0.8 MG TABS   clopidogrel (PLAVIX) 75 MG tablet   Dulaglutide (TRULICITY) 3 MG/0.5ML SOPN   fluticasone furoate-vilanterol (BREO ELLIPTA) 200-25 MCG/ACT AEPB   insulin glargine (LANTUS) 100 UNIT/ML injection   Insulin Pen Needle 32G X 4 MM MISC   Lactobacillus (ACIDOPHILUS) CAPS capsule   Methoxy PEG-Epoetin Beta (MIRCERA IJ)   midodrine (PROAMATINE) 5 MG tablet   oxyCODONE-acetaminophen (PERCOCET/ROXICET) 5-325 MG tablet   promethazine (PHENERGAN) 12.5 MG tablet   torsemide (DEMADEX) 20 MG tablet    Shonna Chock, PA-C Surgical Short Stay/Anesthesiology [facility] Phone  [phone] [facility] Phone [phone] 03/18/2023 1:46 PM

## 2023-03-19 ENCOUNTER — Telehealth: Payer: Self-pay | Admitting: Vascular Surgery

## 2023-03-19 ENCOUNTER — Other Ambulatory Visit: Payer: Self-pay

## 2023-03-19 ENCOUNTER — Encounter (HOSPITAL_COMMUNITY)
Admission: RE | Disposition: A | Discharge status: Home Health | Payer: Self-pay | Source: Home / Self Care | Attending: Vascular Surgery

## 2023-03-19 ENCOUNTER — Inpatient Hospital Stay (HOSPITAL_COMMUNITY)
Admission: RE | Admit: 2023-03-19 | Discharge: 2023-03-22 | DRG: 252 | Disposition: A | Discharge status: Home Health | Payer: Medicare HMO | Attending: Vascular Surgery | Admitting: Vascular Surgery

## 2023-03-19 ENCOUNTER — Inpatient Hospital Stay (HOSPITAL_COMMUNITY): Payer: Self-pay | Admitting: Vascular Surgery

## 2023-03-19 ENCOUNTER — Encounter (HOSPITAL_COMMUNITY): Payer: Self-pay | Admitting: Vascular Surgery

## 2023-03-19 ENCOUNTER — Inpatient Hospital Stay (HOSPITAL_COMMUNITY): Payer: Medicare HMO | Admitting: Vascular Surgery

## 2023-03-19 DIAGNOSIS — E1140 Type 2 diabetes mellitus with diabetic neuropathy, unspecified: Secondary | ICD-10-CM | POA: Diagnosis present

## 2023-03-19 DIAGNOSIS — I132 Hypertensive heart and chronic kidney disease with heart failure and with stage 5 chronic kidney disease, or end stage renal disease: Secondary | ICD-10-CM | POA: Diagnosis present

## 2023-03-19 DIAGNOSIS — Z794 Long term (current) use of insulin: Secondary | ICD-10-CM

## 2023-03-19 DIAGNOSIS — Z6841 Body Mass Index (BMI) 40.0 and over, adult: Secondary | ICD-10-CM | POA: Diagnosis not present

## 2023-03-19 DIAGNOSIS — E1151 Type 2 diabetes mellitus with diabetic peripheral angiopathy without gangrene: Secondary | ICD-10-CM | POA: Diagnosis present

## 2023-03-19 DIAGNOSIS — E1165 Type 2 diabetes mellitus with hyperglycemia: Secondary | ICD-10-CM | POA: Diagnosis present

## 2023-03-19 DIAGNOSIS — Z89512 Acquired absence of left leg below knee: Secondary | ICD-10-CM

## 2023-03-19 DIAGNOSIS — Z79899 Other long term (current) drug therapy: Secondary | ICD-10-CM | POA: Diagnosis not present

## 2023-03-19 DIAGNOSIS — E875 Hyperkalemia: Secondary | ICD-10-CM | POA: Diagnosis not present

## 2023-03-19 DIAGNOSIS — D631 Anemia in chronic kidney disease: Secondary | ICD-10-CM | POA: Diagnosis present

## 2023-03-19 DIAGNOSIS — N186 End stage renal disease: Secondary | ICD-10-CM | POA: Diagnosis present

## 2023-03-19 DIAGNOSIS — T8740 Infection of amputation stump, unspecified extremity: Secondary | ICD-10-CM

## 2023-03-19 DIAGNOSIS — I70231 Atherosclerosis of native arteries of right leg with ulceration of thigh: Secondary | ICD-10-CM

## 2023-03-19 DIAGNOSIS — I480 Paroxysmal atrial fibrillation: Secondary | ICD-10-CM | POA: Diagnosis present

## 2023-03-19 DIAGNOSIS — I4892 Unspecified atrial flutter: Secondary | ICD-10-CM | POA: Diagnosis present

## 2023-03-19 DIAGNOSIS — Z87891 Personal history of nicotine dependence: Secondary | ICD-10-CM

## 2023-03-19 DIAGNOSIS — E782 Mixed hyperlipidemia: Secondary | ICD-10-CM | POA: Diagnosis present

## 2023-03-19 DIAGNOSIS — Z951 Presence of aortocoronary bypass graft: Secondary | ICD-10-CM | POA: Diagnosis not present

## 2023-03-19 DIAGNOSIS — I70234 Atherosclerosis of native arteries of right leg with ulceration of heel and midfoot: Secondary | ICD-10-CM | POA: Diagnosis not present

## 2023-03-19 DIAGNOSIS — Z7901 Long term (current) use of anticoagulants: Secondary | ICD-10-CM

## 2023-03-19 DIAGNOSIS — I4510 Unspecified right bundle-branch block: Secondary | ICD-10-CM | POA: Diagnosis present

## 2023-03-19 DIAGNOSIS — G4733 Obstructive sleep apnea (adult) (pediatric): Secondary | ICD-10-CM | POA: Diagnosis present

## 2023-03-19 DIAGNOSIS — Z8249 Family history of ischemic heart disease and other diseases of the circulatory system: Secondary | ICD-10-CM

## 2023-03-19 DIAGNOSIS — Z91048 Other nonmedicinal substance allergy status: Secondary | ICD-10-CM

## 2023-03-19 DIAGNOSIS — I70221 Atherosclerosis of native arteries of extremities with rest pain, right leg: Secondary | ICD-10-CM

## 2023-03-19 DIAGNOSIS — L97419 Non-pressure chronic ulcer of right heel and midfoot with unspecified severity: Secondary | ICD-10-CM | POA: Diagnosis present

## 2023-03-19 DIAGNOSIS — E1122 Type 2 diabetes mellitus with diabetic chronic kidney disease: Secondary | ICD-10-CM | POA: Diagnosis present

## 2023-03-19 DIAGNOSIS — Z89412 Acquired absence of left great toe: Secondary | ICD-10-CM

## 2023-03-19 DIAGNOSIS — Z801 Family history of malignant neoplasm of trachea, bronchus and lung: Secondary | ICD-10-CM

## 2023-03-19 DIAGNOSIS — Z992 Dependence on renal dialysis: Secondary | ICD-10-CM | POA: Diagnosis not present

## 2023-03-19 DIAGNOSIS — I5022 Chronic systolic (congestive) heart failure: Secondary | ICD-10-CM | POA: Diagnosis present

## 2023-03-19 DIAGNOSIS — I2510 Atherosclerotic heart disease of native coronary artery without angina pectoris: Secondary | ICD-10-CM | POA: Diagnosis present

## 2023-03-19 DIAGNOSIS — I70239 Atherosclerosis of native arteries of right leg with ulceration of unspecified site: Secondary | ICD-10-CM

## 2023-03-19 DIAGNOSIS — Z7902 Long term (current) use of antithrombotics/antiplatelets: Secondary | ICD-10-CM

## 2023-03-19 HISTORY — PX: FEMORAL-POPLITEAL BYPASS GRAFT: SHX937

## 2023-03-19 HISTORY — DX: Atherosclerosis of native arteries of extremities with rest pain, right leg: I70.221

## 2023-03-19 LAB — COMPREHENSIVE METABOLIC PANEL
ALT: 25 U/L (ref 0–44)
AST: 19 U/L (ref 15–41)
Albumin: 2.3 g/dL — ABNORMAL LOW (ref 3.5–5.0)
Alkaline Phosphatase: 75 U/L (ref 38–126)
Anion gap: 12 (ref 5–15)
BUN: 38 mg/dL — ABNORMAL HIGH (ref 8–23)
CO2: 21 mmol/L — ABNORMAL LOW (ref 22–32)
Calcium: 8.1 mg/dL — ABNORMAL LOW (ref 8.9–10.3)
Chloride: 105 mmol/L (ref 98–111)
Creatinine, Ser: 5.37 mg/dL — ABNORMAL HIGH (ref 0.61–1.24)
GFR, Estimated: 11 mL/min — ABNORMAL LOW (ref >60)
Glucose, Bld: 155 mg/dL — ABNORMAL HIGH (ref 70–99)
Potassium: 4.9 mmol/L (ref 3.5–5.1)
Sodium: 138 mmol/L (ref 135–145)
Total Bilirubin: 0.6 mg/dL (ref 0.3–1.2)
Total Protein: 7.1 g/dL (ref 6.5–8.1)

## 2023-03-19 LAB — CBC
HCT: 26 % — ABNORMAL LOW (ref 39.0–52.0)
Hemoglobin: 7.3 g/dL — ABNORMAL LOW (ref 13.0–17.0)
MCH: 26.8 pg (ref 26.0–34.0)
MCHC: 28.1 g/dL — ABNORMAL LOW (ref 30.0–36.0)
MCV: 95.6 fL (ref 80.0–100.0)
Platelets: 375 10*3/uL (ref 150–400)
RBC: 2.72 MIL/uL — ABNORMAL LOW (ref 4.22–5.81)
RDW: 20.6 % — ABNORMAL HIGH (ref 11.5–15.5)
WBC: 13.8 10*3/uL — ABNORMAL HIGH (ref 4.0–10.5)
nRBC: 0 % (ref 0.0–0.2)

## 2023-03-19 LAB — BPAM RBC
Blood Product Expiration Date: 202410032359
Blood Product Expiration Date: 202410202359
ISSUE DATE / TIME: 202409261246
ISSUE DATE / TIME: 202409261520
Unit Type and Rh: 6200
Unit Type and Rh: 6200

## 2023-03-19 LAB — GLUCOSE, CAPILLARY
Glucose-Capillary: 157 mg/dL — ABNORMAL HIGH (ref 70–99)
Glucose-Capillary: 124 mg/dL — ABNORMAL HIGH (ref 70–99)
Glucose-Capillary: 112 mg/dL — ABNORMAL HIGH (ref 70–99)
Glucose-Capillary: 166 mg/dL — ABNORMAL HIGH (ref 70–99)
Glucose-Capillary: 249 mg/dL — ABNORMAL HIGH (ref 70–99)

## 2023-03-19 LAB — PREPARE RBC (CROSSMATCH)

## 2023-03-19 LAB — TYPE AND SCREEN
ABO/RH(D): A POS
Antibody Screen: NEGATIVE
Unit division: 0
Unit division: 0

## 2023-03-19 LAB — APTT: aPTT: 28 seconds (ref 24–36)

## 2023-03-19 LAB — PROTIME-INR
INR: 1.1 (ref 0.8–1.2)
Prothrombin Time: 14.5 seconds (ref 11.4–15.2)

## 2023-03-19 LAB — POCT ACTIVATED CLOTTING TIME
Activated Clotting Time: 201 seconds
Activated Clotting Time: 226 seconds
Activated Clotting Time: 250 seconds

## 2023-03-19 LAB — SURGICAL PCR SCREEN
MRSA, PCR: NEGATIVE
Staphylococcus aureus: NEGATIVE

## 2023-03-19 LAB — HEPATITIS B SURFACE ANTIGEN: Hepatitis B Surface Ag: NONREACTIVE

## 2023-03-19 SURGERY — BYPASS GRAFT FEMORAL-POPLITEAL ARTERY
Anesthesia: General | Site: Groin | Laterality: Right

## 2023-03-19 MED ORDER — HYDRALAZINE HCL 20 MG/ML IJ SOLN: 5.0000 mg | INTRAMUSCULAR | Status: DC | PRN

## 2023-03-19 MED ORDER — FENTANYL CITRATE (PF) 100 MCG/2ML IJ SOLN
INTRAMUSCULAR | Status: AC
  Filled 2023-03-19: qty 2

## 2023-03-19 MED ORDER — ONDANSETRON HCL 4 MG/2ML IJ SOLN
INTRAMUSCULAR | Status: AC
  Filled 2023-03-19: qty 4

## 2023-03-19 MED ORDER — FENTANYL CITRATE (PF) 250 MCG/5ML IJ SOLN
INTRAMUSCULAR | Status: AC
  Filled 2023-03-19: qty 5

## 2023-03-19 MED ORDER — ONDANSETRON HCL 4 MG/2ML IJ SOLN
INTRAMUSCULAR | Status: DC | PRN
  Administered 2023-03-19: 4 mg via INTRAVENOUS

## 2023-03-19 MED ORDER — LABETALOL HCL 5 MG/ML IV SOLN: 10.0000 mg | INTRAVENOUS | Status: DC | PRN

## 2023-03-19 MED ORDER — SODIUM CHLORIDE 0.9 % IV SOLN: INTRAVENOUS | Status: DC

## 2023-03-19 MED ORDER — HEPARIN SODIUM (PORCINE) 1000 UNIT/ML IJ SOLN
INTRAMUSCULAR | Status: DC | PRN
Start: 2023-03-19 — End: 2023-03-19
  Administered 2023-03-19 (×2): 5000 [IU] via INTRAVENOUS
  Administered 2023-03-19: 10000 [IU] via INTRAVENOUS

## 2023-03-19 MED ORDER — TORSEMIDE 20 MG PO TABS
20.0000 mg | ORAL_TABLET | Freq: Two times a day (BID) | ORAL | Status: DC
  Administered 2023-03-19 – 2023-03-22 (×6): 20 mg via ORAL
  Filled 2023-03-19 (×6): qty 1

## 2023-03-19 MED ORDER — ACETAMINOPHEN 160 MG/5ML PO SOLN: 1000.0000 mg | Freq: Once | ORAL | Status: DC | PRN

## 2023-03-19 MED ORDER — ONDANSETRON HCL 4 MG/2ML IJ SOLN: 4.0000 mg | Freq: Four times a day (QID) | INTRAMUSCULAR | Status: DC | PRN

## 2023-03-19 MED ORDER — FLUTICASONE FUROATE-VILANTEROL 200-25 MCG/ACT IN AEPB
1.0000 | INHALATION_SPRAY | Freq: Every day | RESPIRATORY_TRACT | Status: DC
  Administered 2023-03-20 – 2023-03-22 (×3): 1 via RESPIRATORY_TRACT
  Filled 2023-03-19: qty 28

## 2023-03-19 MED ORDER — CLOPIDOGREL BISULFATE 75 MG PO TABS
75.0000 mg | ORAL_TABLET | Freq: Every day | ORAL | Status: DC
  Administered 2023-03-20 – 2023-03-22 (×3): 75 mg via ORAL
  Filled 2023-03-19 (×3): qty 1

## 2023-03-19 MED ORDER — ACETAMINOPHEN 10 MG/ML IV SOLN
1000.0000 mg | Freq: Once | INTRAVENOUS | Status: DC | PRN
  Administered 2023-03-19: 1000 mg via INTRAVENOUS

## 2023-03-19 MED ORDER — CHLORHEXIDINE GLUCONATE 0.12 % MT SOLN
OROMUCOSAL | Status: AC
  Administered 2023-03-19: 15 mL via OROMUCOSAL
  Filled 2023-03-19: qty 15

## 2023-03-19 MED ORDER — PHENOL 1.4 % MT LIQD: 1.0000 | OROMUCOSAL | Status: DC | PRN

## 2023-03-19 MED ORDER — METOPROLOL TARTRATE 5 MG/5ML IV SOLN: 2.0000 mg | INTRAVENOUS | Status: DC | PRN

## 2023-03-19 MED ORDER — PROTAMINE SULFATE 10 MG/ML IV SOLN
INTRAVENOUS | Status: DC | PRN
Start: 2023-03-19 — End: 2023-03-19
  Administered 2023-03-19: 50 mg via INTRAVENOUS

## 2023-03-19 MED ORDER — GUAIFENESIN-DM 100-10 MG/5ML PO SYRP: 15.0000 mL | ORAL_SOLUTION | ORAL | Status: DC | PRN

## 2023-03-19 MED ORDER — INSULIN ASPART 100 UNIT/ML IJ SOLN
0.0000 [IU] | Freq: Three times a day (TID) | INTRAMUSCULAR | Status: DC
  Administered 2023-03-20: 3 [IU] via SUBCUTANEOUS
  Administered 2023-03-20: 2 [IU] via SUBCUTANEOUS
  Administered 2023-03-21 (×2): 3 [IU] via SUBCUTANEOUS

## 2023-03-19 MED ORDER — PHENYLEPHRINE HCL-NACL 20-0.9 MG/250ML-% IV SOLN
INTRAVENOUS | Status: DC | PRN
  Administered 2023-03-19: 20 ug/min via INTRAVENOUS

## 2023-03-19 MED ORDER — ALBUTEROL SULFATE (2.5 MG/3ML) 0.083% IN NEBU: 2.5000 mg | INHALATION_SOLUTION | Freq: Four times a day (QID) | RESPIRATORY_TRACT | Status: DC | PRN

## 2023-03-19 MED ORDER — CHLORHEXIDINE GLUCONATE 0.12 % MT SOLN: 15.0000 mL | Freq: Once | OROMUCOSAL | Status: AC

## 2023-03-19 MED ORDER — DOCUSATE SODIUM 100 MG PO CAPS
100.0000 mg | ORAL_CAPSULE | Freq: Every day | ORAL | Status: DC
  Administered 2023-03-20 – 2023-03-22 (×3): 100 mg via ORAL
  Filled 2023-03-19 (×3): qty 1

## 2023-03-19 MED ORDER — SODIUM CHLORIDE 0.9 % IV SOLN
INTRAVENOUS | Status: DC | PRN
Start: 2023-03-19 — End: 2023-03-19

## 2023-03-19 MED ORDER — SODIUM CHLORIDE 0.9% IV SOLUTION: Freq: Once | INTRAVENOUS | Status: DC

## 2023-03-19 MED ORDER — OXYCODONE-ACETAMINOPHEN 5-325 MG PO TABS
1.0000 | ORAL_TABLET | ORAL | Status: DC | PRN
  Administered 2023-03-19 – 2023-03-21 (×4): 2 via ORAL
  Filled 2023-03-19 (×4): qty 2

## 2023-03-19 MED ORDER — HEPARIN 6000 UNIT IRRIGATION SOLUTION
Status: AC
  Filled 2023-03-19: qty 500

## 2023-03-19 MED ORDER — STERILE WATER FOR IRRIGATION IR SOLN
Status: DC | PRN
Start: 2023-03-19 — End: 2023-03-19
  Administered 2023-03-19: 1000 mL

## 2023-03-19 MED ORDER — PHENYLEPHRINE 80 MCG/ML (10ML) SYRINGE FOR IV PUSH (FOR BLOOD PRESSURE SUPPORT)
PREFILLED_SYRINGE | INTRAVENOUS | Status: DC | PRN
  Administered 2023-03-19 (×2): 80 ug via INTRAVENOUS

## 2023-03-19 MED ORDER — POTASSIUM CHLORIDE CRYS ER 20 MEQ PO TBCR: 20.0000 meq | EXTENDED_RELEASE_TABLET | Freq: Every day | ORAL | Status: DC | PRN

## 2023-03-19 MED ORDER — PHENYLEPHRINE 80 MCG/ML (10ML) SYRINGE FOR IV PUSH (FOR BLOOD PRESSURE SUPPORT)
PREFILLED_SYRINGE | INTRAVENOUS | Status: AC
  Filled 2023-03-19: qty 20

## 2023-03-19 MED ORDER — SODIUM CHLORIDE 0.9 % IV SOLN: 10.0000 mL/h | Freq: Once | INTRAVENOUS | Status: DC

## 2023-03-19 MED ORDER — ACETAMINOPHEN 650 MG RE SUPP: 325.0000 mg | RECTAL | Status: DC | PRN

## 2023-03-19 MED ORDER — PROPOFOL 10 MG/ML IV BOLUS
INTRAVENOUS | Status: DC | PRN
Start: 2023-03-19 — End: 2023-03-19
  Administered 2023-03-19: 110 mg via INTRAVENOUS

## 2023-03-19 MED ORDER — MIDODRINE HCL 5 MG PO TABS
5.0000 mg | ORAL_TABLET | Freq: Every day | ORAL | Status: DC
  Administered 2023-03-20 – 2023-03-22 (×3): 5 mg via ORAL
  Filled 2023-03-19 (×3): qty 1

## 2023-03-19 MED ORDER — SODIUM CHLORIDE 0.9 % IV SOLN
500.0000 mL | Freq: Once | INTRAVENOUS | Status: AC | PRN
  Administered 2023-03-21: 500 mL via INTRAVENOUS

## 2023-03-19 MED ORDER — PROMETHAZINE HCL 12.5 MG PO TABS: 12.5000 mg | ORAL_TABLET | Freq: Three times a day (TID) | ORAL | Status: DC | PRN

## 2023-03-19 MED ORDER — ROCURONIUM BROMIDE 10 MG/ML (PF) SYRINGE
PREFILLED_SYRINGE | INTRAVENOUS | Status: AC
  Filled 2023-03-19: qty 20

## 2023-03-19 MED ORDER — SUCCINYLCHOLINE CHLORIDE 200 MG/10ML IV SOSY
PREFILLED_SYRINGE | INTRAVENOUS | Status: DC | PRN
  Administered 2023-03-19: 160 mg via INTRAVENOUS

## 2023-03-19 MED ORDER — PANTOPRAZOLE SODIUM 40 MG PO TBEC
40.0000 mg | DELAYED_RELEASE_TABLET | Freq: Every day | ORAL | Status: DC
  Administered 2023-03-19 – 2023-03-22 (×4): 40 mg via ORAL
  Filled 2023-03-19 (×4): qty 1

## 2023-03-19 MED ORDER — ALUM & MAG HYDROXIDE-SIMETH 200-200-20 MG/5ML PO SUSP: 15.0000 mL | ORAL | Status: DC | PRN

## 2023-03-19 MED ORDER — ACETAMINOPHEN 325 MG PO TABS
325.0000 mg | ORAL_TABLET | ORAL | Status: DC | PRN
  Administered 2023-03-20: 650 mg via ORAL
  Filled 2023-03-19: qty 2

## 2023-03-19 MED ORDER — ROCURONIUM BROMIDE 10 MG/ML (PF) SYRINGE
PREFILLED_SYRINGE | INTRAVENOUS | Status: DC | PRN
  Administered 2023-03-19: 20 mg via INTRAVENOUS
  Administered 2023-03-19: 50 mg via INTRAVENOUS

## 2023-03-19 MED ORDER — MAGNESIUM SULFATE 2 GM/50ML IV SOLN: 2.0000 g | Freq: Every day | INTRAVENOUS | Status: DC | PRN

## 2023-03-19 MED ORDER — 0.9 % SODIUM CHLORIDE (POUR BTL) OPTIME
TOPICAL | Status: DC | PRN
  Administered 2023-03-19: 2000 mL

## 2023-03-19 MED ORDER — CEFAZOLIN SODIUM-DEXTROSE 2-4 GM/100ML-% IV SOLN: 2.0000 g | Freq: Three times a day (TID) | INTRAVENOUS | Status: DC

## 2023-03-19 MED ORDER — FENTANYL CITRATE (PF) 250 MCG/5ML IJ SOLN
INTRAMUSCULAR | Status: DC | PRN
  Administered 2023-03-19: 100 ug via INTRAVENOUS

## 2023-03-19 MED ORDER — DEXAMETHASONE SODIUM PHOSPHATE 10 MG/ML IJ SOLN
INTRAMUSCULAR | Status: AC
  Filled 2023-03-19: qty 2

## 2023-03-19 MED ORDER — SUCCINYLCHOLINE CHLORIDE 200 MG/10ML IV SOSY
PREFILLED_SYRINGE | INTRAVENOUS | Status: AC
  Filled 2023-03-19: qty 10

## 2023-03-19 MED ORDER — DULAGLUTIDE 3 MG/0.5ML ~~LOC~~ SOAJ: 3.0000 mg | SUBCUTANEOUS | Status: DC

## 2023-03-19 MED ORDER — OXYCODONE HCL 5 MG/5ML PO SOLN: 5.0000 mg | Freq: Once | ORAL | Status: DC | PRN

## 2023-03-19 MED ORDER — LACTATED RINGERS IV SOLN: INTRAVENOUS | Status: DC

## 2023-03-19 MED ORDER — ORAL CARE MOUTH RINSE: 15.0000 mL | Freq: Once | OROMUCOSAL | Status: AC

## 2023-03-19 MED ORDER — POLYETHYLENE GLYCOL 3350 17 G PO PACK: 17.0000 g | PACK | Freq: Every day | ORAL | Status: DC | PRN

## 2023-03-19 MED ORDER — HYDROMORPHONE HCL 1 MG/ML IJ SOLN: 0.5000 mg | INTRAMUSCULAR | Status: DC | PRN

## 2023-03-19 MED ORDER — INSULIN ASPART 100 UNIT/ML IJ SOLN: 0.0000 [IU] | INTRAMUSCULAR | Status: DC | PRN

## 2023-03-19 MED ORDER — EPHEDRINE SULFATE-NACL 50-0.9 MG/10ML-% IV SOSY
PREFILLED_SYRINGE | INTRAVENOUS | Status: DC | PRN
  Administered 2023-03-19: 10 mg via INTRAVENOUS
  Administered 2023-03-19: 7.5 mg via INTRAVENOUS

## 2023-03-19 MED ORDER — ACETAMINOPHEN 500 MG PO TABS: 1000.0000 mg | ORAL_TABLET | Freq: Once | ORAL | Status: DC | PRN

## 2023-03-19 MED ORDER — SUGAMMADEX SODIUM 200 MG/2ML IV SOLN
INTRAVENOUS | Status: DC | PRN
  Administered 2023-03-19 (×2): 222.2 mg via INTRAVENOUS

## 2023-03-19 MED ORDER — OXYCODONE HCL 5 MG PO TABS: 5.0000 mg | ORAL_TABLET | Freq: Once | ORAL | Status: DC | PRN

## 2023-03-19 MED ORDER — EPHEDRINE 5 MG/ML INJ
INTRAVENOUS | Status: AC
  Filled 2023-03-19: qty 10

## 2023-03-19 MED ORDER — INSULIN GLARGINE-YFGN 100 UNIT/ML ~~LOC~~ SOLN
20.0000 [IU] | Freq: Two times a day (BID) | SUBCUTANEOUS | Status: DC
  Administered 2023-03-19 – 2023-03-22 (×6): 20 [IU] via SUBCUTANEOUS
  Filled 2023-03-19 (×8): qty 0.2

## 2023-03-19 MED ORDER — CHLORHEXIDINE GLUCONATE CLOTH 2 % EX PADS: 6.0000 | MEDICATED_PAD | Freq: Once | CUTANEOUS | Status: DC

## 2023-03-19 MED ORDER — DEXAMETHASONE SODIUM PHOSPHATE 10 MG/ML IJ SOLN
INTRAMUSCULAR | Status: DC | PRN
  Administered 2023-03-19: 10 mg via INTRAVENOUS

## 2023-03-19 MED ORDER — FENTANYL CITRATE (PF) 100 MCG/2ML IJ SOLN
25.0000 ug | INTRAMUSCULAR | Status: DC | PRN
  Administered 2023-03-19: 50 ug via INTRAVENOUS

## 2023-03-19 MED ORDER — CEFAZOLIN SODIUM-DEXTROSE 2-4 GM/100ML-% IV SOLN
2.0000 g | INTRAVENOUS | Status: AC
  Administered 2023-03-19: 2 g via INTRAVENOUS

## 2023-03-19 MED ORDER — CEFAZOLIN SODIUM-DEXTROSE 2-4 GM/100ML-% IV SOLN
INTRAVENOUS | Status: AC
  Filled 2023-03-19: qty 100

## 2023-03-19 MED ORDER — AMIODARONE HCL 200 MG PO TABS
200.0000 mg | ORAL_TABLET | Freq: Two times a day (BID) | ORAL | Status: DC
  Administered 2023-03-19 – 2023-03-22 (×6): 200 mg via ORAL
  Filled 2023-03-19 (×6): qty 1

## 2023-03-19 MED ORDER — HEPARIN 6000 UNIT IRRIGATION SOLUTION
Status: DC | PRN
  Administered 2023-03-19: 1

## 2023-03-19 MED ORDER — ATORVASTATIN CALCIUM 80 MG PO TABS
80.0000 mg | ORAL_TABLET | Freq: Every day | ORAL | Status: DC
  Administered 2023-03-20 – 2023-03-22 (×3): 80 mg via ORAL
  Filled 2023-03-19 (×3): qty 1

## 2023-03-19 MED ORDER — LIDOCAINE 2% (20 MG/ML) 5 ML SYRINGE
INTRAMUSCULAR | Status: AC
  Filled 2023-03-19: qty 10

## 2023-03-19 SURGICAL SUPPLY — 45 items
APL PRP STRL LF DISP 70% ISPRP (MISCELLANEOUS) ×2
APL SKNCLS STERI-STRIP NONHPOA (GAUZE/BANDAGES/DRESSINGS) ×4
BAG COUNTER SPONGE SURGICOUNT (BAG) ×1 IMPLANT
BAG SPNG CNTER NS LX DISP (BAG) ×1
BENZOIN TINCTURE PRP APPL 2/3 (GAUZE/BANDAGES/DRESSINGS) IMPLANT
CANISTER SUCT 3000ML PPV (MISCELLANEOUS) ×1 IMPLANT
CANNULA VESSEL 3MM 2 BLNT TIP (CANNULA) ×2 IMPLANT
CHLORAPREP W/TINT 26 (MISCELLANEOUS) ×2 IMPLANT
DRSG TEGADERM 4X4.75 (GAUZE/BANDAGES/DRESSINGS) IMPLANT
ELECT REM PT RETURN 9FT ADLT (ELECTROSURGICAL) ×1
ELECTRODE REM PT RTRN 9FT ADLT (ELECTROSURGICAL) ×1 IMPLANT
GAUZE SPONGE 4X4 12PLY STRL (GAUZE/BANDAGES/DRESSINGS) ×1 IMPLANT
GLOVE BIO SURGEON STRL SZ8 (GLOVE) ×1 IMPLANT
GOWN STRL REUS W/ TWL LRG LVL3 (GOWN DISPOSABLE) ×2 IMPLANT
GOWN STRL REUS W/ TWL XL LVL3 (GOWN DISPOSABLE) ×1 IMPLANT
GOWN STRL REUS W/TWL LRG LVL3 (GOWN DISPOSABLE) ×2
GOWN STRL REUS W/TWL XL LVL3 (GOWN DISPOSABLE) ×1
GRAFT PROPATEN W/RING 6X80X60 (Vascular Products) IMPLANT
INSERT FOGARTY SM (MISCELLANEOUS) IMPLANT
KIT BASIN OR (CUSTOM PROCEDURE TRAY) ×1 IMPLANT
KIT TURNOVER KIT B (KITS) ×1 IMPLANT
MARKER GRAFT CORONARY BYPASS (MISCELLANEOUS) IMPLANT
NS IRRIG 1000ML POUR BTL (IV SOLUTION) ×2 IMPLANT
PACK PERIPHERAL VASCULAR (CUSTOM PROCEDURE TRAY) ×1 IMPLANT
PAD ARMBOARD 7.5X6 YLW CONV (MISCELLANEOUS) ×2 IMPLANT
PUNCH AORTIC ROTATE 4.0MM (MISCELLANEOUS) IMPLANT
SET COLLECT BLD 21X3/4 12 (NEEDLE) IMPLANT
SET WALTER ACTIVATION W/DRAPE (SET/KITS/TRAYS/PACK) IMPLANT
STOPCOCK 4 WAY LG BORE MALE ST (IV SETS) IMPLANT
SUT ETHILON 3 0 PS 1 (SUTURE) IMPLANT
SUT MNCRL AB 4-0 PS2 18 (SUTURE) ×2 IMPLANT
SUT PROLENE 5 0 C 1 24 (SUTURE) ×1 IMPLANT
SUT PROLENE 6 0 BV (SUTURE) ×1 IMPLANT
SUT SILK 2 0 SH (SUTURE) ×1 IMPLANT
SUT SILK 3 0 (SUTURE)
SUT SILK 3-0 18XBRD TIE 12 (SUTURE) IMPLANT
SUT VIC AB 2-0 CT1 27 (SUTURE) ×2
SUT VIC AB 2-0 CT1 TAPERPNT 27 (SUTURE) ×2 IMPLANT
SUT VIC AB 3-0 SH 27 (SUTURE) ×2
SUT VIC AB 3-0 SH 27X BRD (SUTURE) ×2 IMPLANT
TAPE UMBILICAL 1/8X30 (MISCELLANEOUS) IMPLANT
TOWEL GREEN STERILE (TOWEL DISPOSABLE) ×1 IMPLANT
TRAY FOLEY MTR SLVR 16FR STAT (SET/KITS/TRAYS/PACK) ×1 IMPLANT
UNDERPAD 30X36 HEAVY ABSORB (UNDERPADS AND DIAPERS) ×1 IMPLANT
WATER STERILE IRR 1000ML POUR (IV SOLUTION) ×1 IMPLANT

## 2023-03-19 NOTE — Transfer of Care (Signed)
Immediate Anesthesia Transfer of Care Note  Patient: [name] [name]  Procedure(s) Performed: RIGHT COMMON FEMORAL-BELOW KNEE POPLITEAL ARTERY BYPASS (Right: Groin)  Patient Location: PACU  Anesthesia Type:General  Level of Consciousness: awake, alert , and oriented  Airway & Oxygen Therapy: Patient Spontanous Breathing  Post-op Assessment: Report given to RN and Post -op Vital signs reviewed and stable  Post vital signs: Reviewed and stable  Last Vitals:  Vitals Value Taken Time  BP 116/61 03/19/23 1517  Temp 36.5 C 03/19/23 1445  Pulse 82 03/19/23 1524  Resp 20 03/19/23 1524  SpO2 94 % 03/19/23 1524  Vitals shown include unfiled device data.  Last Pain:  Vitals:   03/19/23 1515  TempSrc:   PainSc: 5       Patients Stated Pain Goal: 3 (03/19/23 1445)  Complications: No notable events documented.

## 2023-03-19 NOTE — Op Note (Signed)
DATE OF SERVICE: 03/19/2023  PATIENT:  [name] [name]  67 y.o. male  PRE-OPERATIVE DIAGNOSIS: Atherosclerosis of native arteries of right lower extremity causing ulceration  POST-OPERATIVE DIAGNOSIS:  Same  PROCEDURE:   Right common femoral to below-knee popliteal artery bypass with 6 mm ePTFE in subfascial tunnel  SURGEON:  Surgeons and Role:    * Leonie Wasil, MD - Primary  ASSISTANT: Loel Dubonnet, PA-C  An experienced assistant was required given the complexity of this procedure and the standard of surgical care. My assistant helped with exposure through counter tension, suctioning, ligation and retraction to better visualize the surgical field.  My assistant expedited sewing during the case by following my sutures. Wherever I use the term "we" in the report, my assistant actively helped me with that portion of the procedure.  ANESTHESIA:   general  EBL:  BLOOD ADMINISTERED: 1u PRBC  DRAINS: none   LOCAL MEDICATIONS USED:  NONE  SPECIMEN:  none  COUNTS: confirmed correct.  TOURNIQUET:  none  PATIENT DISPOSITION:  PACU - hemodynamically stable.   Delay start of Pharmacological VTE agent (>24hrs) due to surgical blood loss or risk of bleeding: no  INDICATION FOR PROCEDURE: [name] [name] is a 67 y.o. male with right ischemic heel ulceration and several of significant peripheral arterial disease.  The patient to preoperative angiogram which showed occlusion of the superficial femoral artery and above-knee popliteal artery.  He did have reconstitution of a healthy segment of below-knee popliteal artery. After careful discussion of risks, benefits, and alternatives the patient was offered right common femoral to below-knee popliteal artery bypass. The patient understood and wished to proceed.  OPERATIVE FINDINGS: Successful right common femoral artery to below-knee popliteal artery bypass.  Brisk Doppler flow in the posterior tibial artery which was graft dependent at  completion.  DESCRIPTION OF PROCEDURE: After identification of the patient in the pre-operative holding area, the patient was transferred to the operating room. The patient was positioned supine on the operating room table. Anesthesia was induced. The right leg was prepped and draped in standard fashion. A surgical pause was performed confirming correct patient, procedure, and operative location.  An oblique incision was made in the right groin and carried down through subcutaneous tissue using Bovie electrocautery.  The femoral sheath was identified.  This was opened longitudinally.  The external iliac artery, and distal common femoral artery were exposed.  We found areas that were suitable for clamp.  The artery was heavily diseased.  Longitudinal incision was made in the right calf below the knee to expose the below-knee popliteal artery.  Incision was carried down through the subcutaneous tissue until the superficial posterior compartment of the leg was identified.  The fascia was incised.  The semimembranosus tendon was divided.  The popliteal vascular bundle was identified by sweeping the gastrocnemius posteriorly.  The vascular bundle was skeletonized.  We identified a suitable segment of below-knee popliteal artery and encircled it proximally and distally with Silastic Vesseloops.  A subfascial tunnel was created connecting the 2 exposures using a Kelly-Wick tunneler.  A 6 mm externally supported PTFE vascular graft was delivered through the tunneler taking great care to avoid twisting or kinking the graft.  The patient was systemically heparinized.  Activated clotting time measurements were used throughout the case to confirm adequate anticoagulation.  External iliac artery and distal common femoral artery were clamped.  2 clamps were required proximally and distally because of the heavy calcification.  I did find a soft area and  made an arteriotomy here.  The proximal end of the vascular graft  was beveled and sewn end-to-side to the arteriotomy using continuous running suture of 5-0 Prolene.  After completion, the clamps were released.  The anastomosis was flushed on the open end of the vascular graft.  Hemostasis was confirmed in the proximal anastomosis.  Attention was turned to the distal anastomosis.  The distal anastomosis was sized with the knee straight to avoid undue redundancy or tension.  The distal end of the vascular graft was beveled to allow end-to-side anastomosis to the popliteal artery.  Silastic Vesseloops were secured down and uses clamps.  A arteriotomy was made with an 11 blade and extended with Potts scissors.  The anastomosis was then performed in an end-to-side fashion using continuous running suture of 6-0 Prolene.  Prior to completion the anastomosis was flushed and de-aired.  Clamps were released on the bypass, and popliteal artery.  Hemostasis was confirmed the anastomosis.  The revascularization was evaluated with Doppler machine.  Brisk Doppler flow was heard in the right posterior tibial artery at the ankle.  This was graft dependent.  Satisfied we ended the case here.  Heparin was reversed with protamine.  Hemostasis was again confirmed in the anastomoses in the surgical beds.  The wounds were closed in layers using 2-0 Vicryl, 3-0 Vicryl, 4-0 Monocryl.  Benzoin and Steri-Strips were applied.  4 x 4's and Tegaderm were applied.  Upon completion of the case instrument and sharps counts were confirmed correct. The patient was transferred to the PACU in good condition. I was present for all portions of the procedure.  FOLLOW UP PLAN: Assuming a normal postoperative course, I will see the patient in 4 weeks with ABI.   Rande Brunt. Lenell Antu, MD [facility] Phone Number: [phone] 03/19/2023 3:12 PM

## 2023-03-19 NOTE — Progress Notes (Signed)
PHARMACY NOTE:  ANTIMICROBIAL RENAL DOSAGE ADJUSTMENT  Current antimicrobial regimen includes a mismatch between antimicrobial dosage and estimated renal function.  As per policy approved by the Pharmacy & Therapeutics and Medical Executive Committees, the antimicrobial dosage will be adjusted accordingly.  Current antimicrobial dosage:  Cefazolin 2g IV q 8h x 2 post-op  Indication: Surgical ppx  Renal Function:  Estimated Creatinine Clearance: 16.4 mL/min (A) (by C-G formula based on SCr of 5.37 mg/dL (H)). [x]      On intermittent HD, scheduled: MWF usually, scheduled for 9/27 []      On CRRT    Antimicrobial dosage has been changed to:  2g IV x 1 pre-op dose, no further doses post-op with current iHD schedule  Additional comments:   Thank you for allowing pharmacy to be a part of this patient's care.  Daylene Posey, [facility] 03/19/2023 5:14 PM

## 2023-03-19 NOTE — Consult Note (Signed)
[name] [name] Admit Date: 03/19/2023 03/19/2023 Arita Miss Requesting Physician:  Lenell Antu MD  Reason for Consult:  ESRD comanagement HPI:  89M PMH ESRD on HD, DM 2, hypertension, atrial fibrillation, CAD with history of CABG, PVD status post left BKA 01/26/2023 complicated by BKA site infection requiring revision and wound VAC who developed progressive ulcer on right foot and underwent catheterization on 9/13 with findings of PAD amenable to right femoropopliteal bypass which he received earlier today.  Patient seen in PACU, groggy but in no acute distress.  No complaints.  Last HD 9/23 as far as I can tell where he left 0.3 kg under his EDW.  Available labs with potassium of 4.9, BUN 38, hemoglobin 7.3 for which it appears he has received 1 unit PRBCs.  Outpt HD Orders Unit: [facility] Days: MWF Time: 3.5h Dialyzer: F180 EDW: 112kg K/Ca: 2/2.5 Access: RUE AVF Needle Size: 15g x2 BFR/DFR: 400/800 VDRA: Hectoral qTx EPO: Mircera 225 q2wk last given 9/20 IV Fe: none Heparin: none  ROS Balance of 12 systems is negative w/ exceptions as above  PMH  Past Medical History:  Diagnosis Date   Acute kidney failure, unspecified (HCC) 12/05/2021   Anemia 04/17/2022   Blood transfusion   Atherosclerotic heart disease    Benign essential HTN 07/30/2014   Bradycardia 01/25/2015   CHF (congestive heart failure) (HCC)    Chronic anticoagulation 02/25/2018   Chronic systolic CHF (congestive heart failure), NYHA class 3 (HCC) 08/30/2014   Overview:  Global ef 30%   CKD (chronic kidney disease)    Class 3 severe obesity in adult (HCC) 04/28/2017   Coronary artery disease    Diabetes mellitus without complication (HCC)    Diabetic neuropathy (HCC) 07/28/2014   Dysrhythmia    Erectile dysfunction    GERD (gastroesophageal reflux disease)    High risk medication use 05/26/2018   Hypertensive heart disease with heart failure (HCC) 07/30/2014   Leukocytosis 11/26/2021   Lumbago    LV  dysfunction 04/28/2017   Mixed hyperlipidemia 07/28/2014   OSA (obstructive sleep apnea)    CPAP   Paroxysmal atrial fibrillation (HCC) 07/28/2014   Postop check 01/06/2022   S/P CABG x 3 11/12/2021   Sinus node dysfunction (HCC) 04/21/2016   Testicular hypofunction    Uncontrolled type 2 diabetes mellitus with microalbuminuric diabetic nephropathy 07/28/2014   PSH  Past Surgical History:  Procedure Laterality Date   ABDOMINAL AORTOGRAM W/LOWER EXTREMITY N/A 12/29/2022   Procedure: ABDOMINAL AORTOGRAM W/LOWER EXTREMITY;  Surgeon: Maeola Harman, MD;  Location: [facility] INVASIVE CV LAB;  Service: Cardiovascular;  Laterality: N/A;   ABDOMINAL AORTOGRAM W/LOWER EXTREMITY N/A 01/22/2023   Procedure: ABDOMINAL AORTOGRAM W/LOWER EXTREMITY;  Surgeon: [name], MD;  Location: [facility] INVASIVE CV LAB;  Service: Cardiovascular;  Laterality: N/A;   ABDOMINAL AORTOGRAM W/LOWER EXTREMITY N/A 03/06/2023   Procedure: ABDOMINAL AORTOGRAM W/LOWER EXTREMITY;  Surgeon: Leonie Errik, MD;  Location: [facility] INVASIVE CV LAB;  Service: Cardiovascular;  Laterality: N/A;   AMPUTATION Left 01/26/2023   Procedure: LEFT AMPUTATION BELOW KNEE;  Surgeon: Leonie Cordarro, MD;  Location: [facility] OR;  Service: Vascular;  Laterality: Left;  with regional block   APPLICATION OF WOUND VAC Left 01/26/2023   Procedure: APPLICATION OF WOUND VAC, LEFT BELOW THE KNEE AMPUTATION STUMP;  Surgeon: Leonie Dandrae, MD;  Location: [facility] OR;  Service: Vascular;  Laterality: Left;  With regional block   APPLICATION OF WOUND VAC Left 02/10/2023   Procedure: APPLICATION OF WOUND VAC;  Surgeon: Chuck Hint, MD;  Location: [facility] OR;  Service: Vascular;  Laterality: Left;   AV FISTULA PLACEMENT Right 05/20/2022   Procedure: RIGHT ARM ARTERIOVENOUS (AV) FISTULA CREATION;  Surgeon: Larina Earthly, MD;  Location: [facility] OR;  Service: Vascular;  Laterality: Right;   BUBBLE STUDY  11/21/2021   Procedure: BUBBLE STUDY;  Surgeon: Laurey Morale,  MD;  Location: [facility] ENDOSCOPY;  Service: Cardiovascular;;   CARDIAC CATHETERIZATION     CARDIOVERSION N/A 11/21/2021   Procedure: CARDIOVERSION;  Surgeon: Laurey Morale, MD;  Location: [facility] ENDOSCOPY;  Service: Cardiovascular;  Laterality: N/A;   CARDIOVERSION N/A 11/25/2021   Procedure: CARDIOVERSION;  Surgeon: Laurey Morale, MD;  Location: [facility] ENDOSCOPY;  Service: Cardiovascular;  Laterality: N/A;   CARDIOVERSION N/A 11/29/2021   Procedure: CARDIOVERSION;  Surgeon: Laurey Morale, MD;  Location: [facility] ENDOSCOPY;  Service: Cardiovascular;  Laterality: N/A;   CARDIOVERSION N/A 02/27/2022   Procedure: CARDIOVERSION;  Surgeon: Little Ishikawa, MD;  Location: [facility] ENDOSCOPY;  Service: Cardiovascular;  Laterality: N/A;   CORONARY ARTERY BYPASS GRAFT N/A 11/12/2021   Procedure: CORONARY ARTERY BYPASS GRAFTING (CABG) x  3 USING LEFT INTERNAL MAMMARY ARTERY AND RIGHT GREATER SAPHENOUS VEIN;  Surgeon: Lovett Sox, MD;  Location: [facility] OR;  Service: Open Heart Surgery;  Laterality: N/A;   ELECTROPHYSIOLOGIC STUDY N/A 01/18/2015   Procedure: CARDIOVERSION;  Surgeon: Lamar Blinks, MD;  Location: [facility] ORS;  Service: Cardiovascular;  Laterality: N/A;   ENDOVEIN HARVEST OF GREATER SAPHENOUS VEIN  11/12/2021   Procedure: ENDOVEIN HARVEST OF GREATER SAPHENOUS VEIN;  Surgeon: Lovett Sox, MD;  Location: [facility] OR;  Service: Open Heart Surgery;;   EYE SURGERY Bilateral 2022   FISTULA SUPERFICIALIZATION Right 07/10/2022   Procedure: RIGHT ARM ARTERIOVENOUS FISTULA SUPERFICIALIZATION;  Surgeon: Maeola Harman, MD;  Location: [facility] OR;  Service: Vascular;  Laterality: Right;   IR FLUORO GUIDE CV LINE RIGHT  11/28/2021   IR US GUIDE VASC ACCESS RIGHT  11/28/2021   LEFT HEART CATH AND CORONARY ANGIOGRAPHY N/A 10/24/2021   Procedure: LEFT HEART CATH AND CORONARY ANGIOGRAPHY;  Surgeon: Corky Crafts, MD;  Location: [facility] INVASIVE CV LAB;  Service: Cardiovascular;  Laterality: N/A;   MAZE N/A  11/12/2021   Procedure: MAZE;  Surgeon: Lovett Sox, MD;  Location: [facility] OR;  Service: Open Heart Surgery;  Laterality: N/A;   PERIPHERAL VASCULAR ATHERECTOMY  12/29/2022   Procedure: PERIPHERAL VASCULAR ATHERECTOMY;  Surgeon: Maeola Harman, MD;  Location: [facility] INVASIVE CV LAB;  Service: Cardiovascular;;   PERIPHERAL VASCULAR INTERVENTION  12/29/2022   Procedure: PERIPHERAL VASCULAR INTERVENTION;  Surgeon: Maeola Harman, MD;  Location: [facility] INVASIVE CV LAB;  Service: Cardiovascular;;  Left SFA, Bilateral illiac   PERIPHERAL VASCULAR INTERVENTION Right 03/06/2023   Procedure: PERIPHERAL VASCULAR INTERVENTION;  Surgeon: Leonie Caz, MD;  Location: [facility] INVASIVE CV LAB;  Service: Cardiovascular;  Laterality: Right;  SFA   STUMP REVISION Left 02/10/2023   Procedure: LEFT BELOW KNEE AMPUTATION REVISION;  Surgeon: Chuck Hint, MD;  Location: [facility] OR;  Service: Vascular;  Laterality: Left;   TEE WITHOUT CARDIOVERSION N/A 11/12/2021   Procedure: TRANSESOPHAGEAL ECHOCARDIOGRAM (TEE);  Surgeon: Lovett Sox, MD;  Location: [facility] OR;  Service: Open Heart Surgery;  Laterality: N/A;   TEE WITHOUT CARDIOVERSION N/A 11/21/2021   Procedure: TRANSESOPHAGEAL ECHOCARDIOGRAM (TEE);  Surgeon: Laurey Morale, MD;  Location: [facility] ENDOSCOPY;  Service: Cardiovascular;  Laterality: N/A;   FH  Family History  Problem Relation Age of Onset  CAD Brother    Drug abuse Brother    Lung cancer Father    CAD Mother    SH  reports that he quit smoking about 17 years ago. His smoking use included cigarettes. He has never used smokeless tobacco. He reports that he does not currently use drugs after having used the following drugs: Marijuana. Frequency: 1.00 time per week. He reports that he does not drink alcohol. Allergies  Allergies  Allergen Reactions   Tape     Plastic tape -needs paper tape   Home medications Prior to Admission medications   Medication Sig Start Date End Date Taking?  Authorizing Provider  amiodarone (PACERONE) 200 MG tablet Take 1 tablet (200 mg total) by mouth 2 (two) times daily. 03/17/22  Yes Baldo Daub, MD  atorvastatin (LIPITOR) 80 MG tablet Take 1 tablet (80 mg total) by mouth daily. 03/16/23  Yes Vann, Jessica U, DO  B Complex-C-Folic Acid (DIALYVITE 800) 0.8 MG TABS Take 0.8 mg by mouth daily. 12/16/21  Yes [provider]  fluticasone furoate-vilanterol (BREO ELLIPTA) 200-25 MCG/ACT AEPB Inhale 1 puff into the lungs daily. 01/02/18  Yes [provider]  insulin glargine (LANTUS) 100 UNIT/ML injection Inject 0.2 mLs (20 Units total) into the skin 2 (two) times daily. 02/12/23  Yes Marlin Canary U, DO  Lactobacillus (ACIDOPHILUS) CAPS capsule Take 2 capsules by mouth daily. 02/13/23  Yes Vann, Jessica U, DO  midodrine (PROAMATINE) 5 MG tablet Take 1 tablet (5 mg total) by mouth daily. 08/19/22  Yes Baldo Daub, MD  oxyCODONE-acetaminophen (PERCOCET/ROXICET) 5-325 MG tablet Take 1 tablet by mouth every 6 (six) hours as needed for severe pain. 02/17/23  Yes Emilie Rutter, PA-C  torsemide (DEMADEX) 20 MG tablet Take 1 tablet (20 mg total) by mouth 2 (two) times daily. 03/11/23 06/09/23 Yes Baldo Daub, MD  albuterol (VENTOLIN HFA) 108 (90 Base) MCG/ACT inhaler Inhale 2 puffs into the lungs every 6 (six) hours as needed for wheezing or shortness of breath.    [provider]  apixaban (ELIQUIS) 5 MG TABS tablet Take 1 tablet (5 mg total) by mouth 2 (two) times daily. 02/18/23   Joseph Art, DO  clopidogrel (PLAVIX) 75 MG tablet Take 1 tablet (75 mg total) by mouth daily. 02/27/23 02/27/24  Joseph Art, DO  Dulaglutide (TRULICITY) 3 MG/0.5ML SOPN Inject 3 mg into the skin every Wednesday.    [provider]  Insulin Pen Needle 32G X 4 MM MISC Use to inject Levemir 2 (two) times daily. 12/03/21   Doree Fudge M, PA-C  Methoxy PEG-Epoetin Beta (MIRCERA IJ) Mircera 12/12/22 12/11/23  [provider]   promethazine (PHENERGAN) 12.5 MG tablet Take 12.5 mg by mouth 3 (three) times daily as needed. 01/05/23   [provider]    Current Medications Scheduled Meds:  Chlorhexidine Gluconate Cloth  6 each Topical Once   And   Chlorhexidine Gluconate Cloth  6 each Topical Once   Continuous Infusions:  sodium chloride 10 mL/hr at 03/19/23 0857   sodium chloride 10 mL/hr at 03/19/23 0907   sodium chloride     acetaminophen     PRN Meds:.acetaminophen, acetaminophen **OR** acetaminophen (TYLENOL) oral liquid 160 mg/5 mL, fentaNYL (SUBLIMAZE) injection, insulin aspart, oxyCODONE **OR** oxyCODONE  CBC Recent Labs  Lab 03/19/23 0916  WBC 13.8*  HGB 7.3*  HCT 26.0*  MCV 95.6  PLT 375   Basic Metabolic Panel Recent Labs  Lab 03/19/23 0916  NA 138  K 4.9  CL 105  CO2 21*  GLUCOSE 155*  BUN 38*  CREATININE 5.37*  CALCIUM 8.1*    Physical Exam  Blood pressure (!) 141/72, pulse 80, temperature 97.7 F (36.5 C), resp. rate (!) 22, height 5\' 10"  (1.778 m), weight 111.1 kg, SpO2 98%. 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

## 2023-03-19 NOTE — Telephone Encounter (Signed)
Pt still in hospital. Will f/u once pt is d/c.

## 2023-03-19 NOTE — Anesthesia Procedure Notes (Addendum)
Procedure Name: Intubation Date/Time: 03/19/2023 11:49 AM  Performed by: Val Eagle, MDPre-anesthesia Checklist: Patient identified, Emergency Drugs available, Suction available and Patient being monitored Patient Re-evaluated:Patient Re-evaluated prior to induction Oxygen Delivery Method: Circle system utilized Preoxygenation: Pre-oxygenation with 100% oxygen Induction Type: IV induction and Rapid sequence Laryngoscope Size: Mac and 4 Grade View: Grade I Tube type: Oral Tube size: 7.0 mm Number of attempts: 1 Airway Equipment and Method: Stylet and Oral airway Placement Confirmation: ETT inserted through vocal cords under direct vision, positive ETCO2 and breath sounds checked- equal and bilateral Secured at: 22 cm Tube secured with: Tape Dental Injury: Teeth and Oropharynx as per pre-operative assessment

## 2023-03-19 NOTE — Interval H&P Note (Signed)
History and Physical Interval Note:  03/19/2023 11:15 AM  [name] [name]  has presented today for surgery, with the diagnosis of Atherosclerosis of native arteries of right lower extremity causing ulceration.  The various methods of treatment have been discussed with the patient and family. After consideration of risks, benefits and other options for treatment, the patient has consented to  Procedure(s): RIGHT COMMON FEMORAL-BELOW KNEE POPLITEAL ARTERY BYPASS (Right) as a surgical intervention.  The patient's history has been reviewed, patient examined, no change in status, stable for surgery.  I have reviewed the patient's chart and labs.  Questions were answered to the patient's satisfaction.     [name] [name]

## 2023-03-19 NOTE — Anesthesia Procedure Notes (Addendum)
Arterial Line Insertion Start/End9/26/2024 11:38 AM, 03/19/2023 11:46 AM Performed by: Darryl Nestle, CRNA, CRNA  Patient location: Pre-op. Preanesthetic checklist: patient identified, IV checked, risks and benefits discussed, surgical consent, monitors and equipment checked, pre-op evaluation, timeout performed and anesthesia consent Lidocaine 1% used for infiltration Left, radial was placed Catheter size: 20 G Hand hygiene performed  and maximum sterile barriers used   Attempts: 1 Procedure performed using ultrasound guided technique. Ultrasound Notes:anatomy identified, needle tip was noted to be adjacent to the nerve/plexus identified and no ultrasound evidence of intravascular and/or intraneural injection Following insertion, dressing applied and Biopatch. Post procedure assessment: normal and unchanged  Patient tolerated the procedure well with no immediate complications.

## 2023-03-20 ENCOUNTER — Encounter (HOSPITAL_COMMUNITY): Payer: Self-pay | Admitting: Vascular Surgery

## 2023-03-20 LAB — GLUCOSE, CAPILLARY
Glucose-Capillary: 174 mg/dL — ABNORMAL HIGH (ref 70–99)
Glucose-Capillary: 182 mg/dL — ABNORMAL HIGH (ref 70–99)
Glucose-Capillary: 132 mg/dL — ABNORMAL HIGH (ref 70–99)
Glucose-Capillary: 190 mg/dL — ABNORMAL HIGH (ref 70–99)

## 2023-03-20 LAB — CBC
HCT: 26.5 % — ABNORMAL LOW (ref 39.0–52.0)
Hemoglobin: 7.9 g/dL — ABNORMAL LOW (ref 13.0–17.0)
MCH: 27.4 pg (ref 26.0–34.0)
MCHC: 29.8 g/dL — ABNORMAL LOW (ref 30.0–36.0)
MCV: 92 fL (ref 80.0–100.0)
Platelets: 331 10*3/uL (ref 150–400)
RBC: 2.88 MIL/uL — ABNORMAL LOW (ref 4.22–5.81)
RDW: 19.3 % — ABNORMAL HIGH (ref 11.5–15.5)
WBC: 18.5 10*3/uL — ABNORMAL HIGH (ref 4.0–10.5)
nRBC: 0 % (ref 0.0–0.2)

## 2023-03-20 LAB — HEPATITIS B SURFACE ANTIBODY, QUANTITATIVE: Hep B S AB Quant (Post): 41.7 m[IU]/mL

## 2023-03-20 LAB — BASIC METABOLIC PANEL
Anion gap: 9 (ref 5–15)
BUN: 40 mg/dL — ABNORMAL HIGH (ref 8–23)
CO2: 20 mmol/L — ABNORMAL LOW (ref 22–32)
Calcium: 7.8 mg/dL — ABNORMAL LOW (ref 8.9–10.3)
Chloride: 105 mmol/L (ref 98–111)
Creatinine, Ser: 5.07 mg/dL — ABNORMAL HIGH (ref 0.61–1.24)
GFR, Estimated: 12 mL/min — ABNORMAL LOW (ref >60)
Glucose, Bld: 188 mg/dL — ABNORMAL HIGH (ref 70–99)
Potassium: 6.2 mmol/L — ABNORMAL HIGH (ref 3.5–5.1)
Sodium: 134 mmol/L — ABNORMAL LOW (ref 135–145)

## 2023-03-20 LAB — LIPID PANEL
Cholesterol: 104 mg/dL (ref 0–200)
HDL: 46 mg/dL (ref >40)
LDL Cholesterol: 44 mg/dL (ref 0–99)
Total CHOL/HDL Ratio: 2.3 {ratio}
Triglycerides: 71 mg/dL (ref <150)
VLDL: 14 mg/dL (ref 0–40)

## 2023-03-20 NOTE — Progress Notes (Addendum)
  Progress Note    03/20/2023 6:43 AM 1 Day Post-Op  Subjective:  no complaints; says he is already home sick  Afebrile HR 70's-100's afib 100's-140's systolic  Vitals:   03/20/23 0200 03/20/23 0400  BP: (!) 160/84 132/70  Pulse: 97 77  Resp: 20 14  Temp: 98.7 F (37.1 C) 97.9 F (36.6 C)  SpO2: 100% 96%    Physical Exam: General:  no distress Cardiac:  regular Lungs:  non labored Incisions:  right groin and right BK incision bandages are clean Extremities:  right foot with brisk PT>AT Abdomen:  soft  CBC    Component Value Date/Time   WBC 13.8 (H) 03/19/2023 0916   RBC 2.72 (L) 03/19/2023 0916   HGB 7.3 (L) 03/19/2023 0916   HGB 7.6 (L) 04/17/2022 1452   HCT 26.0 (L) 03/19/2023 0916   HCT 25.3 (L) 04/17/2022 1452   PLT 375 03/19/2023 0916   PLT 351 04/17/2022 1452   MCV 95.6 03/19/2023 0916   MCV 88 04/17/2022 1452   MCH 26.8 03/19/2023 0916   MCHC 28.1 (L) 03/19/2023 0916   RDW 20.6 (H) 03/19/2023 0916   RDW 15.5 (H) 04/17/2022 1452   LYMPHSABS 1.0 02/05/2023 1408   LYMPHSABS 1.4 02/18/2022 1117   MONOABS 2.0 (H) 02/05/2023 1408   EOSABS 0.2 02/05/2023 1408   EOSABS 0.3 02/18/2022 1117   BASOSABS 0.1 02/05/2023 1408   BASOSABS 0.1 02/18/2022 1117    BMET    Component Value Date/Time   NA 138 03/19/2023 0916   NA 139 04/17/2022 1452   K 4.9 03/19/2023 0916   CL 105 03/19/2023 0916   CO2 21 (L) 03/19/2023 0916   GLUCOSE 155 (H) 03/19/2023 0916   BUN 38 (H) 03/19/2023 0916   BUN 42 (H) 04/17/2022 1452   CREATININE 5.37 (H) 03/19/2023 0916   CALCIUM 8.1 (L) 03/19/2023 0916   GFRNONAA 11 (L) 03/19/2023 0916   GFRAA 42 (L) 10/18/2019 1006    INR    Component Value Date/Time   INR 1.1 03/19/2023 0916     Intake/Output Summary (Last 24 hours) at 03/20/2023 0643 Last data filed at 03/20/2023 2956 Gross per 24 hour  Intake 2680 ml  Output 2225 ml  Net 455 ml      Assessment/Plan:  67 y.o. male is s/p:  Right common femoral to  below-knee popliteal artery bypass with 6 mm ePTFE in subfascial tunnel   1 Day Post-Op   -pt with brisk PT doppler flow right foot -bandages over incisions look good -asa/statin/plavix.  Currently holding Eliquis or heparin.  Labs pending    Doreatha Massed, [facility] [phone] 03/20/2023 6:43 AM  VASCULAR STAFF ADDENDUM: I have independently interviewed and examined the patient. I agree with the above.  Looks great after right femoropopliteal bypass Good Doppler flow in the foot Okay to restart DOAC tomorrow Mobilize as able HD per nephrology  Rande Brunt. Lenell Antu, MD [facility] Phone Number: [phone] 03/20/2023 9:21 AM

## 2023-03-20 NOTE — Care Management Important Message (Signed)
Important Message  Patient Details  Name: [name] [name] MRN: [id number] Date of Birth: 31-Mar-1956   Important Message Given:  Yes - Medicare IM     Sherilyn Banker 03/20/2023, 3:04 PM

## 2023-03-20 NOTE — Progress Notes (Signed)
   03/20/23 1637  Vitals  Temp 98.2 F (36.8 C)  Temp Source Oral  BP 112/60  MAP (mmHg) 78  BP Location Left Arm  BP Method Automatic  Patient Position (if appropriate) Lying  Pulse Rate 90  Pulse Rate Source Monitor  ECG Heart Rate 90  Resp 17  Oxygen Therapy  SpO2 99 %  O2 Device Room Air  Patient Activity (if Appropriate) In bed  Pulse Oximetry Type Continuous  During Treatment Monitoring  Blood Flow Rate (mL/min) 0 mL/min  Arterial Pressure (mmHg) -81.21 mmHg  Venous Pressure (mmHg) 55.35 mmHg  TMP (mmHg) -154.94 mmHg  Ultrafiltration Rate (mL/min) 1120 mL/min  Dialysate Flow Rate (mL/min) 300 ml/min  Dialysate Potassium Concentration 2  Dialysate Calcium Concentration 2.5  Duration of HD Treatment -hour(s) 2.95 hour(s)  Cumulative Fluid Removed (mL) per Treatment  1944  HD Safety Checks Performed Yes  Intra-Hemodialysis Comments Tx completed;Tolerated well  Post Treatment  Dialyzer Clearance Lightly streaked  Hemodialysis Intake (mL) 0 mL  Liters Processed 72  Fluid Removed (mL) 2000 mL  Tolerated HD Treatment Yes  Post-Hemodialysis Comments py goal met.  AVG/AVF Arterial Site Held (minutes) 10 minutes  AVG/AVF Venous Site Held (minutes) 10 minutes

## 2023-03-20 NOTE — Progress Notes (Signed)
[facility] NEPHROLOGY PROGRESS NOTE  Assessment/ Plan: Pt is a 67 y.o. yo male   Outpt HD Orders Unit: [facility],Days: MWF,Time: 3.5h,Dialyzer: F180,EDW: 112kg K/Ca: 2/2.5,Access: RUE AVF,Needle Size: 15g x2 BFR/DFR: 400/800 VDRA: Hectoral qTx EPO: Mircera 225 q2wk last given 9/20 Heparin: none  # ESRD on HD MWF; HD today.  # s/p R FP Bypass 03/19/23 for nonhealing ulcer of foot PAD s/p L BKA with infection/revision 01/2023 per VVS team.   # HTN/volume: UF w/HD.  Monitor BP.  # Anemia, below goal, max ESA not due yet, transfuse as needed.  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  # Hyperkalemia: Managed with dialysis, HD today.  Subjective: Seen and examined at bedside.  Patient is sitting on chair comfortable.  Not in distress but denies nausea, vomiting, chest pain or shortness of breath. Objective Vital signs in last 24 hours: Vitals:   03/20/23 0000 03/20/23 0200 03/20/23 0400 03/20/23 0815  BP: (!) 151/77 (!) 160/84 132/70   Pulse: 94 97 77 77  Resp: 16 20 14 18   Temp: 97.7 F (36.5 C) 98.7 F (37.1 C) 97.9 F (36.6 C)   TempSrc: Oral Oral Oral   SpO2: 99% 100% 96% 100%  Weight:      Height:       Weight change:   Intake/Output Summary (Last 24 hours) at 03/20/2023 1128 Last data filed at 03/20/2023 2841 Gross per 24 hour  Intake 3070 ml  Output 2225 ml  Net 845 ml       Labs: RENAL PANEL Recent Labs  Lab 03/19/23 0916 03/20/23 0630  NA 138 134*  K 4.9 6.2*  CL 105 105  CO2 21* 20*  GLUCOSE 155* 188*  BUN 38* 40*  CREATININE 5.37* 5.07*  CALCIUM 8.1* 7.8*  ALBUMIN 2.3*  --     Liver Function Tests: Recent Labs  Lab 03/19/23 0916  AST 19  ALT 25  ALKPHOS 75  BILITOT 0.6  PROT 7.1  ALBUMIN 2.3*   No results for input(s): "LIPASE", "AMYLASE" in the last 168 hours. No results for input(s): "AMMONIA" in the last 168 hours. CBC: Recent Labs    02/11/23 0321 02/12/23 0948 03/06/23 0744 03/19/23 0916  03/20/23 0630  HGB 7.3* 7.7* 8.8* 7.3* 7.9*  MCV 88.5 88.5  --  95.6 92.0    Cardiac Enzymes: No results for input(s): "CKTOTAL", "CKMB", "CKMBINDEX", "TROPONINI" in the last 168 hours. CBG: Recent Labs  Lab 03/19/23 1444 03/19/23 1748 03/19/23 2144 03/20/23 0637 03/20/23 1113  GLUCAP 112* 166* 249* 174* 182*    Iron Studies: No results for input(s): "IRON", "TIBC", "TRANSFERRIN", "FERRITIN" in the last 72 hours. Studies/Results: PERIPHERAL VASCULAR CATHETERIZATION  Result Date: 03/19/2023 See surgical note for result.   Medications: Infusions:  sodium chloride     sodium chloride Stopped (03/20/23 0625)   magnesium sulfate bolus IVPB      Scheduled Medications:  amiodarone  200 mg Oral BID   atorvastatin  80 mg Oral Daily   clopidogrel  75 mg Oral Daily   docusate sodium  100 mg Oral Daily   [START ON 03/25/2023] Dulaglutide  3 mg Subcutaneous Q Wed   fluticasone furoate-vilanterol  1 puff Inhalation Daily   insulin aspart  0-15 Units Subcutaneous TID WC   insulin glargine-yfgn  20 Units Subcutaneous BID   midodrine  5 mg Oral Daily   pantoprazole  40 mg Oral Daily   torsemide  20 mg Oral BID  have reviewed scheduled and prn medications.  Physical Exam: General:NAD, comfortable Heart:RRR, s1s2 nl Lungs:clear b/l, no crackle Abdomen:soft, Non-tender, non-distended Extremities: Trace peripheral edema Dialysis Access: Right upper extremity AV fistula has good thrill and bruit.  Fawna Cranmer Elpidio Anis Srikar Chiang 03/20/2023,11:28 AM  LOS: 1 day

## 2023-03-20 NOTE — Plan of Care (Signed)
  Problem: Activity: Goal: Ability to return to baseline activity level will improve Outcome: Progressing   Problem: Cardiovascular: Goal: Ability to achieve and maintain adequate cardiovascular perfusion will improve Outcome: Progressing   Problem: Health Behavior/Discharge Planning: Goal: Ability to safely manage health-related needs after discharge will improve Outcome: Progressing   Problem: Coping: Goal: Ability to adjust to condition or change in health will improve Outcome: Progressing   Problem: Health Behavior/Discharge Planning: Goal: Ability to identify and utilize available resources and services will improve Outcome: Progressing Goal: Ability to manage health-related needs will improve Outcome: Progressing

## 2023-03-20 NOTE — Evaluation (Signed)
Physical Therapy Evaluation Patient Details Name: [name] [name] MRN: [id number] DOB: March 26, 1956 Today's Date: 03/20/2023  History of Present Illness  67 y.o. male admitted on 03/19/2023 with Atherosclerosis of native arteries of right lower extremity causing ulceration and underwent Right common femoral to below-knee popliteal artery bypass. Left BKA performed 01/26/23 and revision completed on 8/20. PMHx:AKI with ESRD and HD MWF, CHF, CAD s/p CABG x3, DM, diabetic neuropathy, OSA,  Clinical Impression  Patient presents with decreased mobility due to R lower leg pain, decreased activity tolerance and generalized weakness.  Previously able to transition bed to wheelchair at home via slide board.  He has been sleeping on actual air mattress in the middle of the floor and unable to [employer] lift if/when needed so feel hospital bed at home with air mattress overlay will help with home mobility.  PT will continue to follow in the acute setting.  Feel appropriate for home with follow up HHPT at d/c.         If plan is discharge home, recommend the following: Assistance with cooking/housework;Assist for transportation;A little help with bathing/dressing/bathroom;A little help with walking and/or transfers   Can travel by private vehicle        Equipment Recommendations Hospital bed  Recommendations for Other Services       Functional Status Assessment Patient has had a recent decline in their functional status and demonstrates the ability to make significant improvements in function in a reasonable and predictable amount of time.     Precautions / Restrictions Precautions Precautions: Fall Precaution Comments: heel wound to R foot, wound vac L BKA (from home) Restrictions Weight Bearing Restrictions: No      Mobility  Bed Mobility Overal bed mobility: Needs Assistance Bed Mobility: Supine to Sit     Supine to sit: HOB elevated, Used rails     General bed mobility comments: assist  to scoot hips, pt able to move legs to EOB and lift trunk with rails    Transfers Overall transfer level: Needs assistance Equipment used: Sliding board Transfers: Bed to chair/wheelchair/BSC            Lateral/Scoot Transfers: Mod assist, +2 safety/equipment, Min assist General transfer comment: cues and assist to place board and assist to ensure bed pad sliding with pt as he transitioned across slide board    Ambulation/Gait                  Stairs            Wheelchair Mobility     Tilt Bed    Modified Rankin (Stroke Patients Only)       Balance Overall balance assessment: Needs assistance Sitting-balance support: Feet supported Sitting balance-Leahy Scale: Good                                       Pertinent Vitals/Pain Pain Assessment Pain Assessment: 0-10 Pain Score: 3  Pain Location: R leg Pain Descriptors / Indicators: Aching Pain Intervention(s): Monitored during session    Home Living Family/patient expects to be discharged to:: Private residence Living Arrangements: Spouse/significant other Available Help at Discharge: Family;Available 24 hours/day Type of Home: House Home Access: Ramped entrance       Home Layout: One level Home Equipment: Administrator (4 wheels);Wheelchair - manual;Grab bars - toilet Additional Comments: bari drop arm commode, w/c/cushion and sliding board; now sleeping on air mattress on  the floor; hoyer lift    Prior Function Prior Level of Function : Independent/Modified Independent;Driving             Mobility Comments: Prior to Lt BKA pt was walking without AD. SInce BKA, pt has been using a sliding board at home for transfers. Had started PT. ADLs Comments: takes bus to dialysis; uses lift at dialysis     Extremity/Trunk Assessment   Upper Extremity Assessment Upper Extremity Assessment: Defer to OT evaluation    Lower Extremity Assessment Lower Extremity  Assessment: RLE deficits/detail;LLE deficits/detail RLE Deficits / Details: R lower leg with edema and errythema, noted intact dressing on heel and decreased sensation to light touch distal lower leg and foot; stremgth with generalized weakness, but able to move leg off EOB unaided RLE Sensation: decreased light touch LLE Deficits / Details: BKE with wound vac and Kerlix and ace wrap which was loose so removed and replaced for improved compression; pt able to lift leg unaided, strength not formally tested    Cervical / Trunk Assessment Cervical / Trunk Assessment: Normal  Communication   Communication Communication: No apparent difficulties  Cognition Arousal: Alert Behavior During Therapy: WFL for tasks assessed/performed Overall Cognitive Status: Within Functional Limits for tasks assessed                                          General Comments General comments (skin integrity, edema, etc.): VSS throughout    Exercises     Assessment/Plan    PT Assessment Patient needs continued PT services  PT Problem List Decreased mobility;Pain;Decreased activity tolerance;Decreased strength;Decreased skin integrity       PT Treatment Interventions Therapeutic exercise;Therapeutic activities;Functional mobility training;Balance training;Wheelchair mobility training;Patient/family education    PT Goals (Current goals can be found in the Care Plan section)  Acute Rehab PT Goals Patient Stated Goal: return home PT Goal Formulation: With patient Time For Goal Achievement: 04/03/23 Potential to Achieve Goals: Good    Frequency Min 1X/week     Co-evaluation PT/OT/SLP Co-Evaluation/Treatment: Yes Reason for Co-Treatment: Complexity of the patient's impairments (multi-system involvement) PT goals addressed during session: Mobility/safety with mobility;Balance OT goals addressed during session: ADL's and self-care       AM-PAC PT "6 Clicks" Mobility  Outcome Measure  Help needed turning from your back to your side while in a flat bed without using bedrails?: A Little Help needed moving from lying on your back to sitting on the side of a flat bed without using bedrails?: A Little Help needed moving to and from a bed to a chair (including a wheelchair)?: A Little Help needed standing up from a chair using your arms (e.g., wheelchair or bedside chair)?: Total Help needed to walk in hospital room?: Total Help needed climbing 3-5 steps with a railing? : Total 6 Click Score: 12    End of Session   Activity Tolerance: Patient tolerated treatment well Patient left: in chair;with chair alarm set;with call bell/phone within reach Nurse Communication: Need for lift equipment;Mobility status PT Visit Diagnosis: Other abnormalities of gait and mobility (R26.89)    Time: 1610-9604 PT Time Calculation (min) (ACUTE ONLY): 28 min   Charges:   PT Evaluation $PT Eval Moderate Complexity: 1 Mod   PT General Charges $$ ACUTE PT VISIT: 1 Visit         Sheran Lawless, PT Acute Rehabilitation Services [phone] 03/20/2023  Elray Mcgregor 03/20/2023, 1:03 PM

## 2023-03-20 NOTE — Progress Notes (Signed)
PHARMACIST LIPID MONITORING   [name] [name] is a 67 y.o. male admitted on 03/19/2023 with Atherosclerosis of native arteries of right lower extremity causing ulceration and Right common femoral to below-knee popliteal artery bypass with 6 mm ePTFE in subfascial tunnel.  Pharmacy has been consulted to optimize lipid-lowering therapy with the indication of secondary prevention for clinical ASCVD.  Recent Labs:  Lipid Panel (last 6 months):   Lab Results  Component Value Date   CHOL 104 03/20/2023   TRIG 71 03/20/2023   HDL 46 03/20/2023   CHOLHDL 2.3 03/20/2023   VLDL 14 03/20/2023   LDLCALC 44 03/20/2023    Hepatic function panel (last 6 months):   Lab Results  Component Value Date   AST 19 03/19/2023   ALT 25 03/19/2023   ALKPHOS 75 03/19/2023   BILITOT 0.6 03/19/2023    SCr (since admission):   Serum creatinine: 5.07 mg/dL (H) 16/10/96 0454 Estimated creatinine clearance: 17.4 mL/min (A)  Current therapy and lipid therapy tolerance Current lipid-lowering therapy: atorvastatin 80 mg  Previous lipid-lowering therapies (if applicable): N/A Documented or reported allergies or intolerances to lipid-lowering therapies (if applicable): N/A  Assessment:   Patient is excluded from the protocol due to ESRD (ESRD, elevated LFTs, pregnancy/breastfeeding, active liver disease). LDL =44.   Plan:    1.Statin intensity (high intensity recommended for all patients regardless of the LDL):  No statin changes. The patient is already on a high intensity statin.  2.Add ezetimibe (if any one of the following):   Not indicated at this time.  3.Refer to lipid clinic:   No  4.Follow-up with:  Primary care provider - [name], PA-C  5.Follow-up labs after discharge:  No changes in lipid therapy, repeat a lipid panel in one year.       Cedric Fishman, PharmD 03/20/2023, 7:57 AM

## 2023-03-20 NOTE — Discharge Instructions (Signed)
 [facility] [place]  Discharge instructions  Lower Extremity Bypass Surgery  Please refer to the following instruction for your post-procedure care. Your surgeon or physician assistant will discuss any changes with you.  Activity  You are encouraged to walk as much as you can. You can slowly return to normal activities during the month after your surgery. Avoid strenuous activity and heavy lifting until your doctor tells you it's OK. Avoid activities such as vacuuming or swinging a golf club. Do not drive until your doctor give the OK and you are no longer taking prescription pain medications. It is also normal to have difficulty with sleep habits, eating and bowel movement after surgery. These will go away with time.  Bathing/Showering  Shower daily after you go home. Do not soak in a bathtub, hot tub, or swim until the incision heals completely.  Incision Care  Clean your incision with mild soap and water. Shower every day. Pat the area dry with a clean towel. You do not need a bandage unless otherwise instructed. Do not apply any ointments or creams to your incision. If you have open wounds you will be instructed how to care for them or a visiting nurse may be arranged for you. If you have staples or sutures along your incision they will be removed at your post-op appointment. You may have skin glue on your incision. Do not peel it off. It will come off on its own in about one week.  Wash the groin wound with soap and water daily and pat dry. (No tub bath-only shower)  Then put a dry gauze or washcloth in the groin to keep this area dry to help prevent wound infection.  Do this daily and as needed.  Do not use Vaseline or neosporin on your incisions.  Only use soap and water on your incisions and then protect and keep dry.  Diet  Resume your normal diet. There are no special food restrictions following this procedure. A low fat/ low cholesterol diet is  recommended for all patients with vascular disease. In order to heal from your surgery, it is CRITICAL to get adequate nutrition. Your body requires vitamins, minerals, and protein. Vegetables are the best source of vitamins and minerals. Vegetables also provide the perfect balance of protein. Processed food has little nutritional value, so try to avoid this.  Medications  Resume taking all your medications unless your doctor or physician assistant tells you not to. If your incision is causing pain, you may take over-the-counter pain relievers such as acetaminophen (Tylenol). If you were prescribed a stronger pain medication, please aware these medication can cause nausea and constipation. Prevent nausea by taking the medication with a snack or meal. Avoid constipation by drinking plenty of fluids and eating foods with high amount of fiber, such as fruits, vegetables, and grains. Take Colace 100 mg (an over-the-counter stool softener) twice a day as needed for constipation.  Do not take Tylenol if you are taking prescription pain medications.  Follow Up  Our office will schedule a follow up appointment 2-3 weeks following discharge.  Please call us immediately for any of the following conditions  Severe or worsening pain in your legs or feet while at rest or while walking Increase pain, redness, warmth, or drainage (pus) from your incision site(s) Fever of 101 degree or higher The swelling in your leg with the bypass suddenly worsens and becomes more painful than when you were in the hospital If you have   If you have been instructed to feel your graft pulse then you should do so every day. If you can no longer feel this pulse, call the office immediately. Not all patients are given this instruction. °•  °Leg swelling is common after leg bypass surgery. ° °The swelling should improve over a few months following surgery. To improve the swelling, you may elevate your legs above the level of your heart while  you are sitting or resting. Your surgeon or physician assistant may ask you to apply an ACE wrap or wear compression (TED) stockings to help to reduce swelling. ° °Reduce your risk of vascular disease ° °Stop smoking. If you would like help call [facility] at 1-800-QUIT-NOW (1-800-784-8669) or [facility] at 336-586-4000. ° °• Manage your cholesterol °• Maintain a desired weight °• Control your diabetes weight °• Control your diabetes °• Keep your blood pressure down °•  °If you have any questions, please call the office at [phone] ° ° °

## 2023-03-20 NOTE — Evaluation (Signed)
Occupational Therapy Evaluation Patient Details Name: [name] [name] MRN: [id number] DOB: 06-22-1956 Today's Date: 03/20/2023   History of Present Illness 67 y.o. male admitted on 03/19/2023 with Atherosclerosis of native arteries of right lower extremity causing ulceration and Right common femoral to below-knee popliteal artery bypass. Left BKA revision completed on 8/20. PMHx:AKI with ESRD and HD, CHF, CAD s/p CABG x3,  DM, diabetic neuropathy, OSA,   Clinical Impression   Patient admitted for the diagnosis above.  PTA he was at home undergoing [facility] PT with assist as needed from family.  Patient is currently needing up to Mod A for basic mobility and AD completion leaning side to side.  OT will follow in the acute setting to address deficits, and HH OT can be considered.         If plan is discharge home, recommend the following: Assist for transportation;Assistance with cooking/housework;A little help with bathing/dressing/bathroom;A little help with walking and/or transfers    Functional Status Assessment  Patient has had a recent decline in their functional status and demonstrates the ability to make significant improvements in function in a reasonable and predictable amount of time.  Equipment Recommendations  None recommended by OT    Recommendations for Other Services       Precautions / Restrictions Precautions Precautions: Fall Precaution Comments: heel wound to R foot, wound vac Restrictions Weight Bearing Restrictions: No      Mobility Bed Mobility Overal bed mobility: Needs Assistance Bed Mobility: Supine to Sit     Supine to sit: Min assist, HOB elevated          Transfers Overall transfer level: Needs assistance Equipment used: Sliding board Transfers: Bed to chair/wheelchair/BSC            Lateral/Scoot Transfers: Mod assist        Balance Overall balance assessment: Needs assistance Sitting-balance support: Feet supported Sitting balance-Leahy  Scale: Good                                     ADL either performed or assessed with clinical judgement   ADL       Grooming: Wash/dry hands;Wash/dry face;Supervision/safety;Sitting           Upper Body Dressing : Supervision/safety;Sitting   Lower Body Dressing: Minimal assistance;Sitting/lateral leans   Toilet Transfer: Moderate assistance;Transfer board                   Vision Patient Visual Report: No change from baseline       Perception Perception: Within Functional Limits       Praxis Praxis: WFL       Pertinent Vitals/Pain Pain Assessment Pain Assessment: Faces Faces Pain Scale: Hurts a little bit Pain Location: R leg Pain Descriptors / Indicators: Aching Pain Intervention(s): Monitored during session     Extremity/Trunk Assessment Upper Extremity Assessment Upper Extremity Assessment: Overall WFL for tasks assessed   Lower Extremity Assessment Lower Extremity Assessment: Defer to PT evaluation   Cervical / Trunk Assessment Cervical / Trunk Assessment: Normal   Communication Communication Communication: No apparent difficulties   Cognition Arousal: Alert Behavior During Therapy: WFL for tasks assessed/performed Overall Cognitive Status: Within Functional Limits for tasks assessed  General Comments   HR to 101 with transfer    Exercises     Shoulder Instructions      Home Living Family/patient expects to be discharged to:: Private residence Living Arrangements: Spouse/significant other Available Help at Discharge: Family;Available 24 hours/day Type of Home: House Home Access: Ramped entrance     Home Layout: One level     Bathroom Shower/Tub: Producer, television/film/video: Handicapped height Bathroom Accessibility: Yes How Accessible: Accessible via walker Home Equipment: Shower seat;BSC/3in1;Rollator (4 wheels);Wheelchair - manual;Grab bars -  toilet   Additional Comments: bari drop arm commode, w/c/cushion and sliding board; now sleeping on air mattress on the floor; hoyer lift      Prior Functioning/Environment Prior Level of Function : Independent/Modified Independent;Driving             Mobility Comments: Prior to Lt BKA pt was walking without AD. SInce BKA, pt has been using a sliding board at home for transfers. Had started PT. ADLs Comments: takes bus to dialysis; uses lift at dialysis        OT Problem List: Decreased strength;Decreased activity tolerance;Impaired balance (sitting and/or standing);Pain      OT Treatment/Interventions: Self-care/ADL training;Therapeutic activities;Patient/family education;Energy conservation;Balance training    OT Goals(Current goals can be found in the care plan section) Acute Rehab OT Goals Patient Stated Goal: Return home OT Goal Formulation: With patient Time For Goal Achievement: 04/03/23 Potential to Achieve Goals: Good ADL Goals Pt Will Perform Grooming: with set-up;sitting Pt Will Perform Upper Body Bathing: with set-up;sitting Pt Will Perform Upper Body Dressing: with set-up;sitting Pt Will Transfer to Toilet: with supervision;bedside commode;with transfer board  OT Frequency: Min 1X/week    Co-evaluation PT/OT/SLP Co-Evaluation/Treatment: Yes Reason for Co-Treatment: Complexity of the patient's impairments (multi-system involvement)   OT goals addressed during session: ADL's and self-care      AM-PAC OT "6 Clicks" Daily Activity     Outcome Measure Help from another person eating meals?: None Help from another person taking care of personal grooming?: A Little Help from another person toileting, which includes using toliet, bedpan, or urinal?: A Lot Help from another person bathing (including washing, rinsing, drying)?: A Lot Help from another person to put on and taking off regular upper body clothing?: A Little Help from another person to put on and  taking off regular lower body clothing?: A Lot 6 Click Score: 16   End of Session Nurse Communication: Mobility status  Activity Tolerance: Patient tolerated treatment well Patient left: in chair;with call bell/phone within reach  OT Visit Diagnosis: Muscle weakness (generalized) (M62.81)                Time: 2440-1027 OT Time Calculation (min): 29 min Charges:  OT General Charges $OT Visit: 1 Visit OT Evaluation $OT Eval Moderate Complexity: 1 Mod  03/20/2023  RP, OTR/L  Acute Rehabilitation Services  Office:  [phone]   Suzanna Obey 03/20/2023, 11:17 AM

## 2023-03-21 LAB — GLUCOSE, CAPILLARY
Glucose-Capillary: 115 mg/dL — ABNORMAL HIGH (ref 70–99)
Glucose-Capillary: 177 mg/dL — ABNORMAL HIGH (ref 70–99)
Glucose-Capillary: 184 mg/dL — ABNORMAL HIGH (ref 70–99)
Glucose-Capillary: 135 mg/dL — ABNORMAL HIGH (ref 70–99)

## 2023-03-21 NOTE — Progress Notes (Signed)
Mobility Specialist Progress Note:   03/21/23 1019  Mobility  Activity Transferred from bed to chair  Level of Assistance Moderate assist, patient does 50-74%  Assistive Device Sliding board  Distance Ambulated (ft) 3 ft  Activity Response Tolerated well  Mobility Referral Yes  $Mobility charge 1 Mobility  Mobility Specialist Start Time (ACUTE ONLY) 1002  Mobility Specialist Stop Time (ACUTE ONLY) 1011  Mobility Specialist Time Calculation (min) (ACUTE ONLY) 9 min    Pt received in bed, requesting assistance to transfer to chair. Pt able to perform lateral scoot with ModA+2 for directional cues and stability. Pt denied any discomfort during session, asymptomatic throughout. Pt left in chair with NT present in room.  Leory Plowman  Mobility Specialist Please contact via [employer] office at [phone]

## 2023-03-21 NOTE — Plan of Care (Signed)
  Problem: Education: Goal: Understanding of CV disease, CV risk reduction, and recovery process will improve Outcome: Progressing   Problem: Activity: Goal: Ability to return to baseline activity level will improve Outcome: Progressing   Problem: Cardiovascular: Goal: Ability to achieve and maintain adequate cardiovascular perfusion will improve Outcome: Progressing Goal: Vascular access site(s) Level 0-1 will be maintained Outcome: Progressing   

## 2023-03-21 NOTE — Progress Notes (Addendum)
Progress Note    03/21/2023 6:55 AM 2 Days Post-Op  Subjective:  says the nurse was getting him up with the lift and somehow his leg got caught and now he has some pain around the knee.  He is having trouble bending his knee.   Afebrile HR 70's-90's afib 90's-110's systolic 96% RA  Vitals:   03/20/23 2300 03/21/23 0310  BP: (!) 105/46 (!) 101/48  Pulse: 79 75  Resp: 17 (!) 21  Temp: 98 F (36.7 C)   SpO2: 97% 98%    Physical Exam: General:  no distress Cardiac:  irregular Lungs:  non labored Incisions:  right groin bandage removed-steri strips in place with some bloody drainage on bandage.  No hematoma.  Bandage below knee incision removed and looks good Extremities:  + right peroneal and PT doppler flow; necrotic changes to 3rd and 4th toes and heel; wound vac left BKA with good seal. Abdomen:  soft  CBC    Component Value Date/Time   WBC 18.5 (H) 03/20/2023 0630   RBC 2.88 (L) 03/20/2023 0630   HGB 7.9 (L) 03/20/2023 0630   HGB 7.6 (L) 04/17/2022 1452   HCT 26.5 (L) 03/20/2023 0630   HCT 25.3 (L) 04/17/2022 1452   PLT 331 03/20/2023 0630   PLT 351 04/17/2022 1452   MCV 92.0 03/20/2023 0630   MCV 88 04/17/2022 1452   MCH 27.4 03/20/2023 0630   MCHC 29.8 (L) 03/20/2023 0630   RDW 19.3 (H) 03/20/2023 0630   RDW 15.5 (H) 04/17/2022 1452   LYMPHSABS 1.0 02/05/2023 1408   LYMPHSABS 1.4 02/18/2022 1117   MONOABS 2.0 (H) 02/05/2023 1408   EOSABS 0.2 02/05/2023 1408   EOSABS 0.3 02/18/2022 1117   BASOSABS 0.1 02/05/2023 1408   BASOSABS 0.1 02/18/2022 1117    BMET    Component Value Date/Time   NA 134 (L) 03/20/2023 0630   NA 139 04/17/2022 1452   K 6.2 (H) 03/20/2023 0630   CL 105 03/20/2023 0630   CO2 20 (L) 03/20/2023 0630   GLUCOSE 188 (H) 03/20/2023 0630   BUN 40 (H) 03/20/2023 0630   BUN 42 (H) 04/17/2022 1452   CREATININE 5.07 (H) 03/20/2023 0630   CALCIUM 7.8 (L) 03/20/2023 0630   GFRNONAA 12 (L) 03/20/2023 0630   GFRAA 42 (L) 10/18/2019 1006     INR    Component Value Date/Time   INR 1.1 03/19/2023 0916     Intake/Output Summary (Last 24 hours) at 03/21/2023 0655 Last data filed at 03/21/2023 0500 Gross per 24 hour  Intake --  Output 2200 ml  Net -2200 ml      Assessment/Plan:  67 y.o. male is s/p:  Right common femoral to below-knee popliteal artery bypass with 6 mm ePTFE in subfascial tunnel   2 Days Post-Op   -pt with + doppler flow right peroneal and PT  -incisions look fine-discussed groin wound care and to keep dry unless cleansing with soap and water. -wound vac will need to be changed.  Pt will ask his ex-wife to bring supplies and we can change this tomorrow.  -pt with right knee pain after incident with the lift.  Pt seen with Dr. Lindwood Coke tender to palpation but soft and no evidence of bleeding.   -DVT prophylaxis:  pt on Eliquis prior to admission.  Will probably restart eliquis tomorrow or even Monday given leg issues as above.  -conitnue plavix and statin   Doreatha Massed, PA-C [facility] [phone] 03/21/2023 6:55  AM  VASCULAR STAFF ADDENDUM: I have independently interviewed and examined the patient. I agree with the above.  Right leg assessed, no pain at the knee joint, pain is distal, within the muscle.  No hematoma present.  Will continue to manage conservatively at this time.  Excellent anterior tibial artery signal. Out of bed as tolerated.  Victorino Sparrow MD [facility] [place] Phone Number: [phone] 03/21/2023 11:04 AM

## 2023-03-21 NOTE — Plan of Care (Signed)
  Problem: Education: Goal: Understanding of CV disease, CV risk reduction, and recovery process will improve Outcome: Progressing   Problem: Activity: Goal: Ability to return to baseline activity level will improve Outcome: Progressing   Problem: Cardiovascular: Goal: Ability to achieve and maintain adequate cardiovascular perfusion will improve Outcome: Progressing Goal: Vascular access site(s) Level 0-1 will be maintained Outcome: Progressing   Problem: Health Behavior/Discharge Planning: Goal: Ability to safely manage health-related needs after discharge will improve Outcome: Progressing   Problem: Education: Goal: Ability to describe self-care measures that may prevent or decrease complications (Diabetes Survival Skills Education) will improve Outcome: Progressing   Problem: Coping: Goal: Ability to adjust to condition or change in health will improve Outcome: Progressing   Problem: Fluid Volume: Goal: Ability to maintain a balanced intake and output will improve Outcome: Progressing   Problem: Health Behavior/Discharge Planning: Goal: Ability to identify and utilize available resources and services will improve Outcome: Progressing Goal: Ability to manage health-related needs will improve Outcome: Progressing   Problem: Metabolic: Goal: Ability to maintain appropriate glucose levels will improve Outcome: Progressing   Problem: Nutritional: Goal: Maintenance of adequate nutrition will improve Outcome: Progressing Goal: Progress toward achieving an optimal weight will improve Outcome: Progressing

## 2023-03-21 NOTE — Progress Notes (Signed)
[facility] NEPHROLOGY PROGRESS NOTE  Assessment/ Plan: Pt is a 67 y.o. yo male   Outpt HD Orders Unit: [facility],Days: MWF,Time: 3.5h,Dialyzer: F180,EDW: 112kg K/Ca: 2/2.5,Access: RUE AVF,Needle Size: 15g x2 BFR/DFR: 400/800 VDRA: Hectoral qTx EPO: Mircera 225 q2wk last given 9/20 Heparin: none  # ESRD on HD MWF; status post dialysis on 9/27 with 2 L UF, tolerated well.  Plan for next HD on Monday.  # s/p R FP Bypass 03/19/23 for nonhealing ulcer of foot PAD s/p L BKA with infection/revision 01/2023 per [facility] team.   # HTN/volume: UF w/HD.  Monitor BP.  # Anemia, below goal, max ESA not due yet, transfuse as needed.  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  # Hyperkalemia: Received dialysis.  Subjective: Seen and examined at bedside.  He is out of bed to chair.  Tolerated dialysis well yesterday.  Denies nausea, vomiting, chest pain or shortness of breath.  His girlfriend was presented with him.  Objective Vital signs in last 24 hours: Vitals:   03/20/23 2300 03/21/23 0310 03/21/23 0745 03/21/23 0825  BP: (!) 105/46 (!) 101/48 (!) 120/57   Pulse: 79 75 87 86  Resp: 17 (!) 21 18 19   Temp: 98 F (36.7 C)  98.2 F (36.8 C)   TempSrc: Oral  Oral   SpO2: 97% 98% 98% 96%  Weight:      Height:       Weight change: 5.769 kg  Intake/Output Summary (Last 24 hours) at 03/21/2023 1143 Last data filed at 03/21/2023 0500 Gross per 24 hour  Intake --  Output 2200 ml  Net -2200 ml       Labs: RENAL PANEL Recent Labs  Lab 03/19/23 0916 03/20/23 0630  NA 138 134*  K 4.9 6.2*  CL 105 105  CO2 21* 20*  GLUCOSE 155* 188*  BUN 38* 40*  CREATININE 5.37* 5.07*  CALCIUM 8.1* 7.8*  ALBUMIN 2.3*  --     Liver Function Tests: Recent Labs  Lab 03/19/23 0916  AST 19  ALT 25  ALKPHOS 75  BILITOT 0.6  PROT 7.1  ALBUMIN 2.3*   No results for input(s): "LIPASE", "AMYLASE" in the last 168 hours. No results for input(s): "AMMONIA" in the last 168  hours. CBC: Recent Labs    02/11/23 0321 02/12/23 0948 03/06/23 0744 03/19/23 0916 03/20/23 0630  HGB 7.3* 7.7* 8.8* 7.3* 7.9*  MCV 88.5 88.5  --  95.6 92.0    Cardiac Enzymes: No results for input(s): "CKTOTAL", "CKMB", "CKMBINDEX", "TROPONINI" in the last 168 hours. CBG: Recent Labs  Lab 03/20/23 0637 03/20/23 1113 03/20/23 1759 03/20/23 2051 03/21/23 0533  GLUCAP 174* 182* 132* 190* 115*    Iron Studies: No results for input(s): "IRON", "TIBC", "TRANSFERRIN", "FERRITIN" in the last 72 hours. Studies/Results: PERIPHERAL VASCULAR CATHETERIZATION  Result Date: 03/19/2023 See surgical note for result.   Medications: Infusions:  sodium chloride     sodium chloride Stopped (03/20/23 0625)   magnesium sulfate bolus IVPB      Scheduled Medications:  amiodarone  200 mg Oral BID   atorvastatin  80 mg Oral Daily   clopidogrel  75 mg Oral Daily   docusate sodium  100 mg Oral Daily   fluticasone furoate-vilanterol  1 puff Inhalation Daily   insulin aspart  0-15 Units Subcutaneous TID WC   insulin glargine-yfgn  20 Units Subcutaneous BID   midodrine  5 mg Oral Daily   pantoprazole  40 mg Oral Daily  torsemide  20 mg Oral BID    have reviewed scheduled and prn medications.  Physical Exam: General:NAD, comfortable Heart:RRR, s1s2 nl Lungs:clear b/l, no crackle Abdomen:soft, Non-tender, non-distended Extremities: Trace peripheral edema Dialysis Access: Right upper extremity AV fistula has good thrill and bruit.  Amaira Safley Prasad Mattheus Rauls 03/21/2023,11:43 AM  LOS: 2 days

## 2023-03-22 LAB — GLUCOSE, CAPILLARY: Glucose-Capillary: 75 mg/dL (ref 70–99)

## 2023-03-22 MED ORDER — OXYCODONE-ACETAMINOPHEN 5-325 MG PO TABS: 1.0000 | ORAL_TABLET | Freq: Four times a day (QID) | ORAL | 0 refills | Status: DC | PRN

## 2023-03-22 MED ORDER — APIXABAN 5 MG PO TABS
5.0000 mg | ORAL_TABLET | Freq: Two times a day (BID) | ORAL | Status: DC
  Administered 2023-03-22: 5 mg via ORAL
  Filled 2023-03-22: qty 1

## 2023-03-22 NOTE — Discharge Summary (Signed)
Discharge Summary     [name] [name] 08-30-1955 67 y.o. male  [id number]  Admission Date: 03/19/2023  Discharge Date: 03/22/2023  Physician: Leonie Tarik, MD  Admission Diagnosis: Critical limb ischemia of right lower extremity [facility]) [I70.221]  HPI:   This is a 67 y.o. male left BKA on 01/26/2023 by Dr. Lenell Antu and revision of left BKA and evacuation of hematoma with placement of biologic dressing and vac placement 02/10/2023 by Dr. Edilia Bo.     He had hx of aortogram with laser atherectomy and stenting left SFA and popliteal arteries on 12/29/2022 by Dr. Randie Heinz for chronic LLE threatening ischemia with toe ulceration.    He underwent left great toe amputation by Dr. Marylene Land and subsequently underwent unsuccessful attempt at retrograde recanalization of the ATA for chronic total occlusion on 01/22/2023 by Dr. Chestine Spore.    Pt was seen on 02/17/2023 and at that time, he had viable muscle in the wound bed and viable skin edges.  The sorbact was left in place with wet to dry dressing and vac placed by [facility] later that day.     Previous ABI/TBI on the right was /0.29 with toe pressure of 44 on 01/20/2023.   Pt returns today for follow up.  Pt states he developed a wound on his right heel over the past week and feels it is from pushing himself up in the bed.  It is not getting any worse or any better.  He resides at home.   He states before his amputation, he was walking without pain in the right leg.  He denies pain in his right foot.  He states if he had to lose the right foot, he might give up.    Prior to retiring, he worked Management consultant.     Pt is on Eliquis.  Hospital Course:  The patient was admitted to the hospital and taken to the operating room on 03/19/2023 and underwent: Right common femoral to below-knee popliteal artery bypass with 6 mm ePTFE in subfascial tunnel     Findings: Successful right common femoral artery to below-knee popliteal artery bypass. Brisk Doppler  flow in the posterior tibial artery which was graft dependent at completion.   The pt tolerated the procedure well and was transported to the PACU in good condition.   By POD 1, he was doing well with brisk right PT doppler flow.    POD 2, pt had his leg caught in the lift and was having some pain distal to the knee.  No hematoma present.  His Eliquis was held due to this.  Otherwise, he was doing well.    POD 3, pt right leg much better.  He continues to have brisk doppler flow right PT.  His wound vac was changed on the left BKA stump and staples and nylon sutures removed.  His right leg was much improved and he was restarted on his Eliquis.   He is discharged home.   CBC    Component Value Date/Time   WBC 18.5 (H) 03/20/2023 0630   RBC 2.88 (L) 03/20/2023 0630   HGB 7.9 (L) 03/20/2023 0630   HGB 7.6 (L) 04/17/2022 1452   HCT 26.5 (L) 03/20/2023 0630   HCT 25.3 (L) 04/17/2022 1452   PLT 331 03/20/2023 0630   PLT 351 04/17/2022 1452   MCV 92.0 03/20/2023 0630   MCV 88 04/17/2022 1452   MCH 27.4 03/20/2023 0630   MCHC 29.8 (L) 03/20/2023 0630   RDW 19.3 (H) 03/20/2023  0630   RDW 15.5 (H) 04/17/2022 1452   LYMPHSABS 1.0 02/05/2023 1408   LYMPHSABS 1.4 02/18/2022 1117   MONOABS 2.0 (H) 02/05/2023 1408   EOSABS 0.2 02/05/2023 1408   EOSABS 0.3 02/18/2022 1117   BASOSABS 0.1 02/05/2023 1408   BASOSABS 0.1 02/18/2022 1117    BMET    Component Value Date/Time   NA 134 (L) 03/20/2023 0630   NA 139 04/17/2022 1452   K 6.2 (H) 03/20/2023 0630   CL 105 03/20/2023 0630   CO2 20 (L) 03/20/2023 0630   GLUCOSE 188 (H) 03/20/2023 0630   BUN 40 (H) 03/20/2023 0630   BUN 42 (H) 04/17/2022 1452   CREATININE 5.07 (H) 03/20/2023 0630   CALCIUM 7.8 (L) 03/20/2023 0630   GFRNONAA 12 (L) 03/20/2023 0630   GFRAA 42 (L) 10/18/2019 1006     Discharge Instructions     Discharge patient   Complete by: As directed    Discharge after pt has been seen by Dr. Karin Lieu.  Thanks   Discharge  disposition: 01-Home or Self Care   Discharge patient date: 03/22/2023       Discharge Diagnosis:  Critical limb ischemia of right lower extremity (HCC) [I70.221]  Secondary Diagnosis: Patient Active Problem List   Diagnosis Date Noted   Critical limb ischemia of right lower extremity (HCC) 03/19/2023   Amputation stump infection (HCC) 02/05/2023   Limb ischemia 01/26/2023   PAD (peripheral artery disease) (HCC) 01/26/2023   S/P BKA (below knee amputation), left (HCC) 01/26/2023   Obstructive airway disease (HCC) 01/26/2023   End stage renal disease (HCC) 03/24/2022   Coronary artery disease 01/17/2022   Allergy, unspecified, initial encounter 12/05/2021   Morbid (severe) obesity due to excess calories (HCC) 12/05/2021   Anemia in chronic kidney disease 12/03/2021   Cardiomyopathy, unspecified (HCC) 12/03/2021   Dependence on renal dialysis (HCC) 12/03/2021   Personal history of nicotine dependence 12/03/2021   Long term (current) use of anticoagulants 12/03/2021   Type 2 diabetes mellitus with diabetic neuropathy, unspecified (HCC) 12/03/2021   S/P CABG x 3 11/12/2021   Chronic diastolic CHF (congestive heart failure) (HCC) 07/15/2021   Dysrhythmia 07/15/2021   Erectile dysfunction 07/15/2021   Back pain due to inflammatory process 07/15/2021   OSA (obstructive sleep apnea) 07/15/2021   Testicular hypofunction 07/15/2021   High risk medication use 05/26/2018   Chronic anticoagulation 02/25/2018   LV dysfunction 04/28/2017   Sinus node dysfunction (HCC) 04/21/2016   Bradycardia 01/25/2015   Benign essential HTN 07/30/2014   Diabetic neuropathy (HCC) 07/28/2014   Mixed hyperlipidemia 07/28/2014   Paroxysmal atrial fibrillation (HCC) 07/28/2014   Past Medical History:  Diagnosis Date   Acute kidney failure, unspecified (HCC) 12/05/2021   Anemia 04/17/2022   Blood transfusion   Atherosclerotic heart disease    Benign essential HTN 07/30/2014   Bradycardia 01/25/2015    CHF (congestive heart failure) (HCC)    Chronic anticoagulation 02/25/2018   Chronic systolic CHF (congestive heart failure), NYHA class 3 (HCC) 08/30/2014   Overview:  Global ef 30%   CKD (chronic kidney disease)    Class 3 severe obesity in adult [facility]) 04/28/2017   Coronary artery disease    Diabetes mellitus without complication (HCC)    Diabetic neuropathy (HCC) 07/28/2014   Dysrhythmia    Erectile dysfunction    GERD (gastroesophageal reflux disease)    High risk medication use 05/26/2018   Hypertensive heart disease with heart failure (HCC) 07/30/2014   Leukocytosis 11/26/2021  Lumbago    LV dysfunction 04/28/2017   Mixed hyperlipidemia 07/28/2014   OSA (obstructive sleep apnea)    CPAP   Paroxysmal atrial fibrillation (HCC) 07/28/2014   Postop check 01/06/2022   S/P CABG x 3 11/12/2021   Sinus node dysfunction (HCC) 04/21/2016   Testicular hypofunction    Uncontrolled type 2 diabetes mellitus with microalbuminuric diabetic nephropathy 07/28/2014     Allergies as of 03/22/2023       Reactions   Tape    Plastic tape -needs paper tape        Medication List     TAKE these medications    Acidophilus Caps capsule Take 2 capsules by mouth daily.   albuterol 108 (90 Base) MCG/ACT inhaler Commonly known as: VENTOLIN HFA Inhale 2 puffs into the lungs every 6 (six) hours as needed for wheezing or shortness of breath.   amiodarone 200 MG tablet Commonly known as: PACERONE Take 1 tablet (200 mg total) by mouth 2 (two) times daily.   apixaban 5 MG Tabs tablet Commonly known as: Eliquis Take 1 tablet (5 mg total) by mouth 2 (two) times daily.   atorvastatin 80 MG tablet Commonly known as: LIPITOR Take 1 tablet (80 mg total) by mouth daily.   BD Pen Needle Nano U/F 32G X 4 MM Misc Generic drug: Insulin Pen Needle Use to inject Levemir 2 (two) times daily.   clopidogrel 75 MG tablet Commonly known as: Plavix Take 1 tablet (75 mg total) by mouth daily.    Dialyvite 800 0.8 MG Tabs Take 0.8 mg by mouth daily.   fluticasone furoate-vilanterol 200-25 MCG/ACT Aepb Commonly known as: BREO ELLIPTA Inhale 1 puff into the lungs daily.   insulin glargine 100 UNIT/ML injection Commonly known as: LANTUS Inject 0.2 mLs (20 Units total) into the skin 2 (two) times daily.   midodrine 5 MG tablet Commonly known as: PROAMATINE Take 1 tablet (5 mg total) by mouth daily.   MIRCERA IJ Mircera   oxyCODONE-acetaminophen 5-325 MG tablet Commonly known as: PERCOCET/ROXICET Take 1 tablet by mouth every 6 (six) hours as needed for severe pain.   promethazine 12.5 MG tablet Commonly known as: PHENERGAN Take 12.5 mg by mouth 3 (three) times daily as needed.   torsemide 20 MG tablet Commonly known as: DEMADEX Take 1 tablet (20 mg total) by mouth 2 (two) times daily.   Trulicity 3 MG/0.5ML Sopn Generic drug: Dulaglutide Inject 3 mg into the skin every Wednesday.        Discharge Instructions: [facility] Discharge instructions Lower Extremity Bypass Surgery  Please refer to the following instruction for your post-procedure care. Your surgeon or physician assistant will discuss any changes with you.  Activity  You are encouraged to walk as much as you can. You can slowly return to normal activities during the month after your surgery. Avoid strenuous activity and heavy lifting until your doctor tells you it's OK. Avoid activities such as vacuuming or swinging a golf club. Do not drive until your doctor give the OK and you are no longer taking prescription pain medications. It is also normal to have difficulty with sleep habits, eating and bowel movement after surgery. These will go away with time.  Bathing/Showering  You may shower after you go home. Do not soak in a bathtub, hot tub, or swim until the incision heals completely.  Incision Care  Clean your incision with mild soap and water. Shower every day.  Pat the area dry  with a clean towel. You do not need a bandage unless otherwise instructed. Do not apply any ointments or creams to your incision. If you have open wounds you will be instructed how to care for them or a visiting nurse may be arranged for you. If you have staples or sutures along your incision they will be removed at your post-op appointment. You may have skin glue on your incision. Do not peel it off. It will come off on its own in about one week.  Wash the groin wound with soap and water daily and pat dry. (No tub bath-only shower)  Then put a dry gauze or washcloth in the groin to keep this area dry to help prevent wound infection.  Do this daily and as needed.  Do not use Vaseline or neosporin on your incisions.  Only use soap and water on your incisions and then protect and keep dry.  Diet  Resume your normal diet. There are no special food restrictions following this procedure. A low fat/ low cholesterol diet is recommended for all patients with vascular disease. In order to heal from your surgery, it is CRITICAL to get adequate nutrition. Your body requires vitamins, minerals, and protein. Vegetables are the best source of vitamins and minerals. Vegetables also provide the perfect balance of protein. Processed food has little nutritional value, so try to avoid this.  Medications  Resume taking all your medications unless your doctor or Physician Assistant tells you not to. If your incision is causing pain, you may take over-the-counter pain relievers such as acetaminophen (Tylenol). If you were prescribed a stronger pain medication, please aware these medication can cause nausea and constipation. Prevent nausea by taking the medication with a snack or meal. Avoid constipation by drinking plenty of fluids and eating foods with high amount of fiber, such as fruits, vegetables, and grains. Take Colace 100 mg (an over-the-counter stool softener) twice a day as needed for constipation.   Do not take Tylenol if you are taking prescription pain medications.  Follow Up  Our office will schedule a follow up appointment 2-3 weeks following discharge.  Please call us immediately for any of the following conditions  Severe or worsening pain in your legs or feet while at rest or while walking Increase pain, redness, warmth, or drainage (pus) from your incision site(s) Fever of 101 degree or higher The swelling in your leg with the bypass suddenly worsens and becomes more painful than when you were in the hospital If you have been instructed to feel your graft pulse then you should do so every day. If you can no longer feel this pulse, call the office immediately. Not all patients are given this instruction.  Leg swelling is common after leg bypass surgery.  The swelling should improve over a few months following surgery. To improve the swelling, you may elevate your legs above the level of your heart while you are sitting or resting. Your surgeon or physician assistant may ask you to apply an ACE wrap or wear compression (TED) stockings to help to reduce swelling.  Reduce your risk of vascular disease  Stop smoking. If you would like help call [facility] at 1-800-QUIT-NOW (310 548 7145) or [place] at (820)431-0797.  Manage your cholesterol Maintain a desired weight Control your diabetes weight Control your diabetes Keep your blood pressure down  If you have any questions, please call the office at [phone]   Prescriptions given: 1.  Roxicet #20 No Refill  Disposition: home  Patient's  condition: is Good  Follow up: 1. [facility] in 2-3 weeks   Doreatha Massed, PA-C [facility] [phone] 03/22/2023  8:24 AM  - For VQI Registry use ---   Post-op:  Wound infection: No  Graft infection: No  Transfusion: Yes    If yes, 2 units given New Arrhythmia: No Ipsilateral amputation: No, [ ]  Minor, [ ]  BKA, [ ]  AKA Discharge patency: [x ]  Primary, [ ]  Primary assisted, [ ]  Secondary, [ ]  Occluded Patency judged by: [x ] Dopper only, [ ]  Palpable graft pulse, []  Palpable distal pulse, [ ]  ABI inc. > 0.15, [ ]  Duplex Discharge ABI: R not done, L  D/C Ambulatory Status: Wheelchair - contralateral amp  Complications: MI: No, [ ]  Troponin only, [ ]  EKG or Clinical CHF: No Resp failure:No, [ ]  Pneumonia, [ ]  Ventilator Chg in renal function: No, [ ]  Inc. Cr > 0.5, [ ]  Temp. Dialysis,  [ ]  Permanent dialysis Stroke: No, [ ]  Minor, [ ]  Major Return to OR: No  Reason for return to OR: [ ]  Bleeding, [ ]  Infection, [ ]  Thrombosis, [ ]  Revision  Discharge medications: Statin use:  yes ASA use:  no Plavix use:  yes Beta blocker use: no CCB use:  No ACEI use:   no ARB use:  no Coumadin use: no Eliquis:  yes

## 2023-03-22 NOTE — TOC Transition Note (Signed)
Transition of Care [facility]) - CM/SW Discharge Note   Patient Details  Name: [name] [name] MRN: [id number] Date of Birth: [birth year]  Transition of Care [facility]) CM/SW Contact:  Ronny Bacon, RN Phone Number: 03/22/2023, 9:05 AM   Clinical Narrative:   Patient is being discharged today. Spoke with patient by phone, discussed therapy recommendations for hospital bed. Patient aware hospital bed will not be delivered to the home today but it will be ordered today. Patient is onboard with plan. Patient also reports currently receives [facility] therapy through [facility]. Message sent to Delila [name] to inform of patient being discharged. Patient confirms that he has a ride home at discharge.    Final next level of care: Home w Home Health Services Barriers to Discharge: No Barriers Identified   Patient Goals and CMS Choice      Discharge Placement                         Discharge Plan and Services Additional resources added to the After Visit Summary for                  DME Arranged: Hospital bed DME Agency: [employer] Date DME Agency Contacted: 03/22/23 Time DME Agency Contacted: [phone] Representative spoke with at DME Agency: [facility]            Social Determinants of Health (SDOH) Interventions SDOH Screenings   Food Insecurity: No Food Insecurity (02/05/2023)  Housing: Low Risk  (02/05/2023)  Transportation Needs: No Transportation Needs (02/05/2023)  Utilities: Not At Risk (02/05/2023)  Tobacco Use: Medium Risk (03/19/2023)     Readmission Risk Interventions    01/29/2023   11:34 AM  Readmission Risk Prevention Plan  Transportation Screening Complete  PCP or Specialist Appt within 5-7 Days Complete  Home Care Screening Complete  Medication Review (RN CM) Complete

## 2023-03-22 NOTE — Progress Notes (Signed)
[facility] NEPHROLOGY PROGRESS NOTE  Assessment/ Plan: Pt is a 67 y.o. yo male   Outpt HD Orders Unit: [facility],Days: MWF,Time: 3.5h,Dialyzer: F180,EDW: 112kg K/Ca: 2/2.5,Access: RUE AVF,Needle Size: 15g x2 BFR/DFR: 400/800 VDRA: Hectoral qTx EPO: Mircera 225 q2wk last given 9/20 Heparin: none  # ESRD on HD MWF; status post dialysis on 9/27 with 2 L UF, tolerated well.  Plan for next HD on Monday.  Plan to discharge home today.  # s/p R FP Bypass 03/19/23 for nonhealing ulcer of foot PAD s/p L BKA with infection/revision 01/2023 per [facility] team.   # HTN/volume: UF w/HD.  Monitor BP.  # Anemia, below goal, max ESA not due yet, transfuse as needed.  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  # Hyperkalemia: Received dialysis.  Subjective: Seen and examined at bedside.  He is ready to go home today.  Denies nausea, vomiting, chest pain, shortness of breath.  Patient said he will go to outpatient HD tomorrow. Objective Vital signs in last 24 hours: Vitals:   03/21/23 2310 03/22/23 0309 03/22/23 0815 03/22/23 0819  BP: (!) 108/53 127/60 (!) 128/56   Pulse: 81 80 93   Resp: 17 18 20    Temp: 98.1 F (36.7 C) 98 F (36.7 C) 98.2 F (36.8 C)   TempSrc: Oral Oral Oral   SpO2: 99% 100% 100% 100%  Weight:      Height:       Weight change:   Intake/Output Summary (Last 24 hours) at 03/22/2023 1024 Last data filed at 03/22/2023 0816 Gross per 24 hour  Intake 0 ml  Output 550 ml  Net -550 ml       Labs: RENAL PANEL Recent Labs  Lab 03/19/23 0916 03/20/23 0630  NA 138 134*  K 4.9 6.2*  CL 105 105  CO2 21* 20*  GLUCOSE 155* 188*  BUN 38* 40*  CREATININE 5.37* 5.07*  CALCIUM 8.1* 7.8*  ALBUMIN 2.3*  --     Liver Function Tests: Recent Labs  Lab 03/19/23 0916  AST 19  ALT 25  ALKPHOS 75  BILITOT 0.6  PROT 7.1  ALBUMIN 2.3*   No results for input(s): "LIPASE", "AMYLASE" in the last 168 hours. No results for input(s): "AMMONIA" in the  last 168 hours. CBC: Recent Labs    02/11/23 0321 02/12/23 0948 03/06/23 0744 03/19/23 0916 03/20/23 0630  HGB 7.3* 7.7* 8.8* 7.3* 7.9*  MCV 88.5 88.5  --  95.6 92.0    Cardiac Enzymes: No results for input(s): "CKTOTAL", "CKMB", "CKMBINDEX", "TROPONINI" in the last 168 hours. CBG: Recent Labs  Lab 03/21/23 0533 03/21/23 1143 03/21/23 1556 03/21/23 2120 03/22/23 0600  GLUCAP 115* 177* 184* 135* 75    Iron Studies: No results for input(s): "IRON", "TIBC", "TRANSFERRIN", "FERRITIN" in the last 72 hours. Studies/Results: No results found.  Medications: Infusions:  sodium chloride Stopped (03/20/23 0625)   magnesium sulfate bolus IVPB      Scheduled Medications:  amiodarone  200 mg Oral BID   apixaban  5 mg Oral BID   atorvastatin  80 mg Oral Daily   clopidogrel  75 mg Oral Daily   docusate sodium  100 mg Oral Daily   fluticasone furoate-vilanterol  1 puff Inhalation Daily   insulin aspart  0-15 Units Subcutaneous TID WC   insulin glargine-yfgn  20 Units Subcutaneous BID   midodrine  5 mg Oral Daily   pantoprazole  40 mg Oral Daily   torsemide  20  mg Oral BID    have reviewed scheduled and prn medications.  Physical Exam: General:NAD, comfortable Heart:RRR, s1s2 nl Lungs:clear b/l, no crackle Abdomen:soft, Non-tender, non-distended Extremities: Trace peripheral edema Dialysis Access: Right upper extremity AV fistula has good thrill and bruit.  Evadene Wardrip Prasad Liona Wengert 03/22/2023,10:24 AM  LOS: 3 days

## 2023-03-22 NOTE — Anesthesia Postprocedure Evaluation (Signed)
Anesthesia Post Note  Patient: [name] [name]  Procedure(s) Performed: RIGHT COMMON FEMORAL-BELOW KNEE POPLITEAL ARTERY BYPASS (Right: Groin)     Patient location during evaluation: PACU Anesthesia Type: General Level of consciousness: awake and alert Pain management: pain level controlled Vital Signs Assessment: post-procedure vital signs reviewed and stable Respiratory status: spontaneous breathing, nonlabored ventilation, respiratory function stable and patient connected to nasal cannula oxygen Cardiovascular status: blood pressure returned to baseline and stable Postop Assessment: no apparent nausea or vomiting Anesthetic complications: no   No notable events documented.  Last Vitals:  Vitals:   03/21/23 2310 03/22/23 0309  BP: (!) 108/53 127/60  Pulse: 81 80  Resp: 17 18  Temp: 36.7 C 36.7 C  SpO2: 99% 100%    Last Pain:  Vitals:   03/22/23 0309  TempSrc: Oral  PainSc:                  [name]

## 2023-03-22 NOTE — Progress Notes (Addendum)
  Progress Note    03/22/2023 7:11 AM 3 Days Post-Op  Subjective:  ready to go home.  Says his right leg is much better today.  Sat in chair yesterday and transferred back to the bed himself.   Afebrile HR 70's-90's NSR 100's-120's systolic 99% RA  Vitals:   03/21/23 2310 03/22/23 0309  BP: (!) 108/53 127/60  Pulse: 81 80  Resp: 17 18  Temp: 98.1 F (36.7 C) 98 F (36.7 C)  SpO2: 99% 100%    Physical Exam: General:  no distress Cardiac:  regular Lungs:  non labored Incisions:  right groin and right below knee incisions look good.   Extremities:  brisk right PT doppler signal Staples and nylon sutures removed from left BKA stump and vac changed   Abdomen:  soft  CBC    Component Value Date/Time   WBC 18.5 (H) 03/20/2023 0630   RBC 2.88 (L) 03/20/2023 0630   HGB 7.9 (L) 03/20/2023 0630   HGB 7.6 (L) 04/17/2022 1452   HCT 26.5 (L) 03/20/2023 0630   HCT 25.3 (L) 04/17/2022 1452   PLT 331 03/20/2023 0630   PLT 351 04/17/2022 1452   MCV 92.0 03/20/2023 0630   MCV 88 04/17/2022 1452   MCH 27.4 03/20/2023 0630   MCHC 29.8 (L) 03/20/2023 0630   RDW 19.3 (H) 03/20/2023 0630   RDW 15.5 (H) 04/17/2022 1452   LYMPHSABS 1.0 02/05/2023 1408   LYMPHSABS 1.4 02/18/2022 1117   MONOABS 2.0 (H) 02/05/2023 1408   EOSABS 0.2 02/05/2023 1408   EOSABS 0.3 02/18/2022 1117   BASOSABS 0.1 02/05/2023 1408   BASOSABS 0.1 02/18/2022 1117    BMET    Component Value Date/Time   NA 134 (L) 03/20/2023 0630   NA 139 04/17/2022 1452   K 6.2 (H) 03/20/2023 0630   CL 105 03/20/2023 0630   CO2 20 (L) 03/20/2023 0630   GLUCOSE 188 (H) 03/20/2023 0630   BUN 40 (H) 03/20/2023 0630   BUN 42 (H) 04/17/2022 1452   CREATININE 5.07 (H) 03/20/2023 0630   CALCIUM 7.8 (L) 03/20/2023 0630   GFRNONAA 12 (L) 03/20/2023 0630   GFRAA 42 (L) 10/18/2019 1006    INR    Component Value Date/Time   INR 1.1 03/19/2023 0916     Intake/Output Summary (Last 24 hours) at 03/22/2023 0711 Last data  filed at 03/21/2023 2000 Gross per 24 hour  Intake 0 ml  Output --  Net 0 ml      Assessment/Plan:  67 y.o. male is s/p:  Right common femoral to below-knee popliteal artery bypass with 6 mm ePTFE in subfascial tunnel   3 Days Post-Op   -pt with brisk doppler signal right PT -incisions right groin and leg look good.  Encouraged him to continue to keep dry gauze in right groin crease to wick moisture.   -vac changed left BKA wound and nylon sutures and the rest of the staples were removed and pt tolerated well.  -DVT prophylaxis:  eliquis ordered to restart today. -discharge home-will have pt f/u in 2-3 weeks for right leg and left BKA wound on Dr. Lenell Antu clinic day.     Doreatha Massed, PA-C [facility] [phone] 03/22/2023 7:11 AM  VASCULAR STAFF ADDENDUM: I have independently interviewed and examined the patient. I agree with the above.  Home today  Victorino Sparrow MD [facility] Phone Number: [phone] 03/22/2023 10:58 AM

## 2023-03-22 NOTE — Progress Notes (Signed)
[facility] Patient Discharge Orders- [facility] CLINIC: [id number]  Patient's name: Joie Reamer Admit/DC Dates: 03/19/2023 - 03/22/2023  Discharge Diagnoses: Critical limb ischemia of right lower extremity / Right common femoral to below-knee popliteal artery bypass   ESRD on HD   Aranesp: Given: no   Date and amount of last dose: 0  Last Hgb: 7.9 PRBC's Given: 0 Date/# of units: 0 ESA dose for discharge: mircera 225 mcg IV q 2 weeks  from 03/13/23 IV Iron dose at discharge: no  Heparin change: no  EDW Change: no New EDW:   [place] Change: no  Access intervention/Change: no Details:  Hectorol/Calcitriol change: no  Discharge Labs: Calcium7.8 Phosphorus 8.1 Albumin 2.3 K+ 6.2  IV Antibiotics: no Details:  On Coumadin?: no0 Last INR: Next INR: Managed By:   OTHER/APPTS/LAB ORDERS:    D/C Meds to be reconciled by nurse after every discharge.  Completed By:   Reviewed by: MD:______ RN_______

## 2023-03-23 DIAGNOSIS — I70221 Atherosclerosis of native arteries of extremities with rest pain, right leg: Secondary | ICD-10-CM

## 2023-03-23 HISTORY — DX: Atherosclerosis of native arteries of extremities with rest pain, right leg: I70.221

## 2023-03-23 NOTE — Progress Notes (Signed)
Late Note Entry- March 23, 2023  Pt d/c yesterday . Contacted [facility] this morning to advise clinic of pt's d/c date and that pt should resume care today.   Olivia Canter Renal Navigator [phone]

## 2023-03-23 NOTE — Telephone Encounter (Signed)
Appts scheduled:

## 2023-03-24 ENCOUNTER — Telehealth: Payer: Self-pay

## 2023-03-24 DIAGNOSIS — Z89512 Acquired absence of left leg below knee: Secondary | ICD-10-CM | POA: Diagnosis not present

## 2023-03-24 DIAGNOSIS — I110 Hypertensive heart disease with heart failure: Secondary | ICD-10-CM | POA: Diagnosis not present

## 2023-03-24 DIAGNOSIS — E1140 Type 2 diabetes mellitus with diabetic neuropathy, unspecified: Secondary | ICD-10-CM | POA: Diagnosis not present

## 2023-03-24 DIAGNOSIS — L89510 Pressure ulcer of right ankle, unstageable: Secondary | ICD-10-CM | POA: Diagnosis not present

## 2023-03-24 DIAGNOSIS — D62 Acute posthemorrhagic anemia: Secondary | ICD-10-CM | POA: Diagnosis not present

## 2023-03-24 DIAGNOSIS — I5181 Takotsubo syndrome: Secondary | ICD-10-CM | POA: Diagnosis not present

## 2023-03-24 DIAGNOSIS — Z48812 Encounter for surgical aftercare following surgery on the circulatory system: Secondary | ICD-10-CM | POA: Diagnosis not present

## 2023-03-24 DIAGNOSIS — L89610 Pressure ulcer of right heel, unstageable: Secondary | ICD-10-CM | POA: Diagnosis not present

## 2023-03-24 DIAGNOSIS — T8744 Infection of amputation stump, left lower extremity: Secondary | ICD-10-CM | POA: Diagnosis not present

## 2023-03-24 DIAGNOSIS — I5042 Chronic combined systolic (congestive) and diastolic (congestive) heart failure: Secondary | ICD-10-CM | POA: Diagnosis not present

## 2023-03-24 DIAGNOSIS — E1151 Type 2 diabetes mellitus with diabetic peripheral angiopathy without gangrene: Secondary | ICD-10-CM | POA: Diagnosis not present

## 2023-03-24 DIAGNOSIS — I70222 Atherosclerosis of native arteries of extremities with rest pain, left leg: Secondary | ICD-10-CM | POA: Diagnosis not present

## 2023-03-24 DIAGNOSIS — I70223 Atherosclerosis of native arteries of extremities with rest pain, bilateral legs: Secondary | ICD-10-CM | POA: Diagnosis not present

## 2023-03-24 NOTE — Telephone Encounter (Signed)
April, RN with [facility] called requesting verbal orders to continue previous wound care to the R heel and ankle, wound vac changes 3 times weekly, and monitor surgical incisions.  Reviewed pt's chart, two identifiers used, verbal orders given.

## 2023-03-24 NOTE — Progress Notes (Signed)
Home health orders signed by Dr. Lenell Antu on 03/16/23, faxed successfully on 03/17/23, scanned into chart.

## 2023-03-25 DIAGNOSIS — R0600 Dyspnea, unspecified: Secondary | ICD-10-CM | POA: Diagnosis not present

## 2023-03-25 DIAGNOSIS — G4733 Obstructive sleep apnea (adult) (pediatric): Secondary | ICD-10-CM | POA: Diagnosis not present

## 2023-03-25 DIAGNOSIS — J4540 Moderate persistent asthma, uncomplicated: Secondary | ICD-10-CM | POA: Diagnosis not present

## 2023-03-27 DIAGNOSIS — N186 End stage renal disease: Secondary | ICD-10-CM | POA: Diagnosis not present

## 2023-03-27 DIAGNOSIS — Z992 Dependence on renal dialysis: Secondary | ICD-10-CM | POA: Diagnosis not present

## 2023-03-27 DIAGNOSIS — Z89512 Acquired absence of left leg below knee: Secondary | ICD-10-CM | POA: Diagnosis not present

## 2023-03-27 DIAGNOSIS — D62 Acute posthemorrhagic anemia: Secondary | ICD-10-CM | POA: Diagnosis not present

## 2023-03-27 DIAGNOSIS — E1151 Type 2 diabetes mellitus with diabetic peripheral angiopathy without gangrene: Secondary | ICD-10-CM | POA: Diagnosis not present

## 2023-03-27 DIAGNOSIS — I110 Hypertensive heart disease with heart failure: Secondary | ICD-10-CM | POA: Diagnosis not present

## 2023-03-27 DIAGNOSIS — I5042 Chronic combined systolic (congestive) and diastolic (congestive) heart failure: Secondary | ICD-10-CM | POA: Diagnosis not present

## 2023-03-27 DIAGNOSIS — E1140 Type 2 diabetes mellitus with diabetic neuropathy, unspecified: Secondary | ICD-10-CM | POA: Diagnosis not present

## 2023-03-27 DIAGNOSIS — R6889 Other general symptoms and signs: Secondary | ICD-10-CM | POA: Diagnosis not present

## 2023-03-27 DIAGNOSIS — T8744 Infection of amputation stump, left lower extremity: Secondary | ICD-10-CM | POA: Diagnosis not present

## 2023-03-27 DIAGNOSIS — N2581 Secondary hyperparathyroidism of renal origin: Secondary | ICD-10-CM | POA: Diagnosis not present

## 2023-03-27 DIAGNOSIS — I70222 Atherosclerosis of native arteries of extremities with rest pain, left leg: Secondary | ICD-10-CM | POA: Diagnosis not present

## 2023-03-27 DIAGNOSIS — I5181 Takotsubo syndrome: Secondary | ICD-10-CM | POA: Diagnosis not present

## 2023-03-27 NOTE — Telephone Encounter (Signed)
Amy, PT with [facility] called stating that she had done a reassessment on the pt and was requesting a verbal order for PT 1 x 3 wks.  Reviewed pt's chart, returned call for clarification, two identifiers used. She discovered that the pt only had 1 more visit remaining without doing a re-certification, so she got a verbal order for 1 time next week. Confirmed understanding.

## 2023-03-29 ENCOUNTER — Other Ambulatory Visit: Payer: Self-pay | Admitting: Student

## 2023-03-30 DIAGNOSIS — Z89512 Acquired absence of left leg below knee: Secondary | ICD-10-CM | POA: Diagnosis not present

## 2023-03-30 DIAGNOSIS — Z992 Dependence on renal dialysis: Secondary | ICD-10-CM | POA: Diagnosis not present

## 2023-03-30 DIAGNOSIS — D62 Acute posthemorrhagic anemia: Secondary | ICD-10-CM | POA: Diagnosis not present

## 2023-03-30 DIAGNOSIS — I5042 Chronic combined systolic (congestive) and diastolic (congestive) heart failure: Secondary | ICD-10-CM | POA: Diagnosis not present

## 2023-03-30 DIAGNOSIS — I70222 Atherosclerosis of native arteries of extremities with rest pain, left leg: Secondary | ICD-10-CM | POA: Diagnosis not present

## 2023-03-30 DIAGNOSIS — E1151 Type 2 diabetes mellitus with diabetic peripheral angiopathy without gangrene: Secondary | ICD-10-CM | POA: Diagnosis not present

## 2023-03-30 DIAGNOSIS — R6889 Other general symptoms and signs: Secondary | ICD-10-CM | POA: Diagnosis not present

## 2023-03-30 DIAGNOSIS — I5181 Takotsubo syndrome: Secondary | ICD-10-CM | POA: Diagnosis not present

## 2023-03-30 DIAGNOSIS — E1140 Type 2 diabetes mellitus with diabetic neuropathy, unspecified: Secondary | ICD-10-CM | POA: Diagnosis not present

## 2023-03-30 DIAGNOSIS — N186 End stage renal disease: Secondary | ICD-10-CM | POA: Diagnosis not present

## 2023-03-30 DIAGNOSIS — I110 Hypertensive heart disease with heart failure: Secondary | ICD-10-CM | POA: Diagnosis not present

## 2023-03-30 DIAGNOSIS — N2581 Secondary hyperparathyroidism of renal origin: Secondary | ICD-10-CM | POA: Diagnosis not present

## 2023-03-30 DIAGNOSIS — T8744 Infection of amputation stump, left lower extremity: Secondary | ICD-10-CM | POA: Diagnosis not present

## 2023-03-31 DIAGNOSIS — I110 Hypertensive heart disease with heart failure: Secondary | ICD-10-CM | POA: Diagnosis not present

## 2023-03-31 DIAGNOSIS — D62 Acute posthemorrhagic anemia: Secondary | ICD-10-CM | POA: Diagnosis not present

## 2023-03-31 DIAGNOSIS — E1140 Type 2 diabetes mellitus with diabetic neuropathy, unspecified: Secondary | ICD-10-CM | POA: Diagnosis not present

## 2023-03-31 DIAGNOSIS — E1151 Type 2 diabetes mellitus with diabetic peripheral angiopathy without gangrene: Secondary | ICD-10-CM | POA: Diagnosis not present

## 2023-03-31 DIAGNOSIS — I5181 Takotsubo syndrome: Secondary | ICD-10-CM | POA: Diagnosis not present

## 2023-03-31 DIAGNOSIS — I5042 Chronic combined systolic (congestive) and diastolic (congestive) heart failure: Secondary | ICD-10-CM | POA: Diagnosis not present

## 2023-03-31 DIAGNOSIS — T8744 Infection of amputation stump, left lower extremity: Secondary | ICD-10-CM | POA: Diagnosis not present

## 2023-03-31 DIAGNOSIS — Z89512 Acquired absence of left leg below knee: Secondary | ICD-10-CM | POA: Diagnosis not present

## 2023-03-31 DIAGNOSIS — I70222 Atherosclerosis of native arteries of extremities with rest pain, left leg: Secondary | ICD-10-CM | POA: Diagnosis not present

## 2023-04-01 DIAGNOSIS — I5181 Takotsubo syndrome: Secondary | ICD-10-CM | POA: Diagnosis not present

## 2023-04-01 DIAGNOSIS — I5042 Chronic combined systolic (congestive) and diastolic (congestive) heart failure: Secondary | ICD-10-CM | POA: Diagnosis not present

## 2023-04-01 DIAGNOSIS — I70222 Atherosclerosis of native arteries of extremities with rest pain, left leg: Secondary | ICD-10-CM | POA: Diagnosis not present

## 2023-04-01 DIAGNOSIS — D62 Acute posthemorrhagic anemia: Secondary | ICD-10-CM | POA: Diagnosis not present

## 2023-04-01 DIAGNOSIS — T8744 Infection of amputation stump, left lower extremity: Secondary | ICD-10-CM | POA: Diagnosis not present

## 2023-04-01 DIAGNOSIS — Z89512 Acquired absence of left leg below knee: Secondary | ICD-10-CM | POA: Diagnosis not present

## 2023-04-01 DIAGNOSIS — E1140 Type 2 diabetes mellitus with diabetic neuropathy, unspecified: Secondary | ICD-10-CM | POA: Diagnosis not present

## 2023-04-01 DIAGNOSIS — E1151 Type 2 diabetes mellitus with diabetic peripheral angiopathy without gangrene: Secondary | ICD-10-CM | POA: Diagnosis not present

## 2023-04-01 DIAGNOSIS — I110 Hypertensive heart disease with heart failure: Secondary | ICD-10-CM | POA: Diagnosis not present

## 2023-04-03 DIAGNOSIS — R6889 Other general symptoms and signs: Secondary | ICD-10-CM | POA: Diagnosis not present

## 2023-04-03 DIAGNOSIS — N186 End stage renal disease: Secondary | ICD-10-CM | POA: Diagnosis not present

## 2023-04-03 DIAGNOSIS — Z992 Dependence on renal dialysis: Secondary | ICD-10-CM | POA: Diagnosis not present

## 2023-04-03 DIAGNOSIS — N2581 Secondary hyperparathyroidism of renal origin: Secondary | ICD-10-CM | POA: Diagnosis not present

## 2023-04-06 ENCOUNTER — Other Ambulatory Visit: Payer: Self-pay | Admitting: *Deleted

## 2023-04-06 DIAGNOSIS — I70239 Atherosclerosis of native arteries of right leg with ulceration of unspecified site: Secondary | ICD-10-CM

## 2023-04-06 DIAGNOSIS — Z992 Dependence on renal dialysis: Secondary | ICD-10-CM | POA: Diagnosis not present

## 2023-04-06 DIAGNOSIS — R6889 Other general symptoms and signs: Secondary | ICD-10-CM | POA: Diagnosis not present

## 2023-04-06 DIAGNOSIS — N186 End stage renal disease: Secondary | ICD-10-CM | POA: Diagnosis not present

## 2023-04-06 DIAGNOSIS — N2581 Secondary hyperparathyroidism of renal origin: Secondary | ICD-10-CM | POA: Diagnosis not present

## 2023-04-07 ENCOUNTER — Telehealth: Payer: Self-pay | Admitting: *Deleted

## 2023-04-07 NOTE — Telephone Encounter (Signed)
April RN from [facility] called she states the wound vac is note draining much fluid and incision site and a lump in the middle. She states patients right heel is improving but he has a scabbed area on top of his right foot. April says patient is requesting a referral to wound care center. Spoke with Dr Lenell Antu he states to continue wound vac changes and wound care to right foot. Patient has appt with Dr Lenell Antu next week and he will review with patient home care needs at that time. Spoke with April and reviewed verbal orders from Dr Lenell Antu she verbalized understanding and will call back if any further changes are noted before patients scheduled appt.

## 2023-04-10 DIAGNOSIS — R6889 Other general symptoms and signs: Secondary | ICD-10-CM | POA: Diagnosis not present

## 2023-04-10 DIAGNOSIS — N2581 Secondary hyperparathyroidism of renal origin: Secondary | ICD-10-CM | POA: Diagnosis not present

## 2023-04-10 DIAGNOSIS — Z992 Dependence on renal dialysis: Secondary | ICD-10-CM | POA: Diagnosis not present

## 2023-04-10 DIAGNOSIS — N186 End stage renal disease: Secondary | ICD-10-CM | POA: Diagnosis not present

## 2023-04-13 DIAGNOSIS — Z992 Dependence on renal dialysis: Secondary | ICD-10-CM | POA: Diagnosis not present

## 2023-04-13 DIAGNOSIS — N2581 Secondary hyperparathyroidism of renal origin: Secondary | ICD-10-CM | POA: Diagnosis not present

## 2023-04-13 DIAGNOSIS — R6889 Other general symptoms and signs: Secondary | ICD-10-CM | POA: Diagnosis not present

## 2023-04-13 DIAGNOSIS — N186 End stage renal disease: Secondary | ICD-10-CM | POA: Diagnosis not present

## 2023-04-14 ENCOUNTER — Ambulatory Visit (INDEPENDENT_AMBULATORY_CARE_PROVIDER_SITE_OTHER)
Admission: RE | Admit: 2023-04-14 | Discharge: 2023-04-14 | Disposition: A | Discharge status: Home / Self Care | Payer: Medicare HMO | Source: Ambulatory Visit | Attending: Vascular Surgery | Admitting: Vascular Surgery

## 2023-04-14 ENCOUNTER — Ambulatory Visit (HOSPITAL_COMMUNITY)
Admission: RE | Admit: 2023-04-14 | Discharge: 2023-04-14 | Disposition: A | Discharge status: Home / Self Care | Payer: Medicare HMO | Source: Ambulatory Visit | Attending: Vascular Surgery | Admitting: Vascular Surgery

## 2023-04-14 DIAGNOSIS — I70239 Atherosclerosis of native arteries of right leg with ulceration of unspecified site: Secondary | ICD-10-CM

## 2023-04-14 LAB — VAS US ABI WITH/WO TBI

## 2023-04-17 DIAGNOSIS — Z992 Dependence on renal dialysis: Secondary | ICD-10-CM | POA: Diagnosis not present

## 2023-04-17 DIAGNOSIS — N2581 Secondary hyperparathyroidism of renal origin: Secondary | ICD-10-CM | POA: Diagnosis not present

## 2023-04-17 DIAGNOSIS — R6889 Other general symptoms and signs: Secondary | ICD-10-CM | POA: Diagnosis not present

## 2023-04-17 DIAGNOSIS — N186 End stage renal disease: Secondary | ICD-10-CM | POA: Diagnosis not present

## 2023-04-20 DIAGNOSIS — N186 End stage renal disease: Secondary | ICD-10-CM | POA: Diagnosis not present

## 2023-04-20 DIAGNOSIS — R6889 Other general symptoms and signs: Secondary | ICD-10-CM | POA: Diagnosis not present

## 2023-04-20 DIAGNOSIS — Z992 Dependence on renal dialysis: Secondary | ICD-10-CM | POA: Diagnosis not present

## 2023-04-20 DIAGNOSIS — N2581 Secondary hyperparathyroidism of renal origin: Secondary | ICD-10-CM | POA: Diagnosis not present

## 2023-04-21 ENCOUNTER — Ambulatory Visit (INDEPENDENT_AMBULATORY_CARE_PROVIDER_SITE_OTHER): Payer: Medicare HMO | Admitting: Physician Assistant

## 2023-04-21 VITALS — BP 128/74 | HR 103 | Temp 98.7°F | Resp 18 | Ht 70.0 in | Wt 273.4 lb

## 2023-04-21 DIAGNOSIS — Z89512 Acquired absence of left leg below knee: Secondary | ICD-10-CM

## 2023-04-21 DIAGNOSIS — S91301A Unspecified open wound, right foot, initial encounter: Secondary | ICD-10-CM

## 2023-04-21 DIAGNOSIS — I70239 Atherosclerosis of native arteries of right leg with ulceration of unspecified site: Secondary | ICD-10-CM

## 2023-04-21 NOTE — Progress Notes (Unsigned)
  POST OPERATIVE OFFICE NOTE    CC:  F/u for surgery  HPI:  This is a 67 y.o. male who is s/p *** on *** by Dr. [name]    Pt returns today for follow up.  Pt states ***  Healing well. No fevers ,chills, pus. Wounds getting smaller. Putting medihoney on right heel wound   Allergies  Allergen Reactions   Tape     Plastic tape -needs paper tape    Current Outpatient Medications  Medication Sig Dispense Refill   albuterol (VENTOLIN HFA) 108 (90 Base) MCG/ACT inhaler Inhale 2 puffs into the lungs every 6 (six) hours as needed for wheezing or shortness of breath.     amiodarone (PACERONE) 200 MG tablet TAKE 1 TABLET TWICE DAILY 180 tablet 3   apixaban (ELIQUIS) 5 MG TABS tablet Take 1 tablet (5 mg total) by mouth 2 (two) times daily.     atorvastatin (LIPITOR) 80 MG tablet Take 1 tablet (80 mg total) by mouth daily.     B Complex-C-Folic Acid (DIALYVITE 800) 0.8 MG TABS Take 0.8 mg by mouth daily.     clopidogrel (PLAVIX) 75 MG tablet Take 1 tablet (75 mg total) by mouth daily.     Dulaglutide (TRULICITY) 3 MG/0.5ML SOPN Inject 3 mg into the skin every Wednesday.     fluticasone furoate-vilanterol (BREO ELLIPTA) 200-25 MCG/ACT AEPB Inhale 1 puff into the lungs daily.  3   insulin glargine (LANTUS) 100 UNIT/ML injection Inject 0.2 mLs (20 Units total) into the skin 2 (two) times daily.     Insulin Pen Needle 32G X 4 MM MISC Use to inject Levemir 2 (two) times daily. 100 each 1   Lactobacillus (ACIDOPHILUS) CAPS capsule Take 2 capsules by mouth daily. 100 capsule 0   Methoxy PEG-Epoetin Beta (MIRCERA IJ) Mircera     midodrine (PROAMATINE) 5 MG tablet Take 1 tablet (5 mg total) by mouth daily. 90 tablet 3   oxyCODONE-acetaminophen (PERCOCET/ROXICET) 5-325 MG tablet Take 1 tablet by mouth every 6 (six) hours as needed for severe pain. 20 tablet 0   promethazine (PHENERGAN) 12.5 MG tablet Take 12.5 mg by mouth 3 (three) times daily as needed.     torsemide (DEMADEX) 20 MG tablet Take 1 tablet  (20 mg total) by mouth 2 (two) times daily. 360 tablet 1   Current Facility-Administered Medications  Medication Dose Route Frequency Provider Last Rate Last Admin   0.9 %  sodium chloride infusion  250 mL Intravenous PRN Leonie Adiel, MD       sodium chloride flush (NS) 0.9 % injection 3 mL  3 mL Intravenous Q12H Leonie Jaquise, MD         ROS:  See HPI  Physical Exam:  ***  Incision:  *** Extremities:  *** Neuro: *** Abdomen:  ***           Assessment/Plan:  This is a 67 y.o. male who is s/p: ***  -Follow up in 3 wks for wound check -WTD vs hydrogel dressings to left BKA, continue medihoney to right heel   Loel Dubonnet, PA-C [facility] [phone]   Clinic MD:  ***

## 2023-04-23 DIAGNOSIS — N186 End stage renal disease: Secondary | ICD-10-CM | POA: Diagnosis not present

## 2023-04-23 DIAGNOSIS — N179 Acute kidney failure, unspecified: Secondary | ICD-10-CM | POA: Diagnosis not present

## 2023-04-23 DIAGNOSIS — Z992 Dependence on renal dialysis: Secondary | ICD-10-CM | POA: Diagnosis not present

## 2023-04-24 DIAGNOSIS — R6889 Other general symptoms and signs: Secondary | ICD-10-CM | POA: Diagnosis not present

## 2023-04-24 DIAGNOSIS — N2581 Secondary hyperparathyroidism of renal origin: Secondary | ICD-10-CM | POA: Diagnosis not present

## 2023-04-24 DIAGNOSIS — N186 End stage renal disease: Secondary | ICD-10-CM | POA: Diagnosis not present

## 2023-04-24 DIAGNOSIS — Z992 Dependence on renal dialysis: Secondary | ICD-10-CM | POA: Diagnosis not present

## 2023-04-27 DIAGNOSIS — N2581 Secondary hyperparathyroidism of renal origin: Secondary | ICD-10-CM | POA: Diagnosis not present

## 2023-04-27 DIAGNOSIS — N186 End stage renal disease: Secondary | ICD-10-CM | POA: Diagnosis not present

## 2023-04-27 DIAGNOSIS — R6889 Other general symptoms and signs: Secondary | ICD-10-CM | POA: Diagnosis not present

## 2023-04-27 DIAGNOSIS — Z992 Dependence on renal dialysis: Secondary | ICD-10-CM | POA: Diagnosis not present

## 2023-04-27 IMAGING — DX DG CHEST 1V PORT
1 series · 1 of 1 positions shown · non-contrast
Comparison: AP chest 11/26/2021

CLINICAL DATA: History of CABG.

EXAM:
PORTABLE CHEST 1 VIEW

[chest ap]
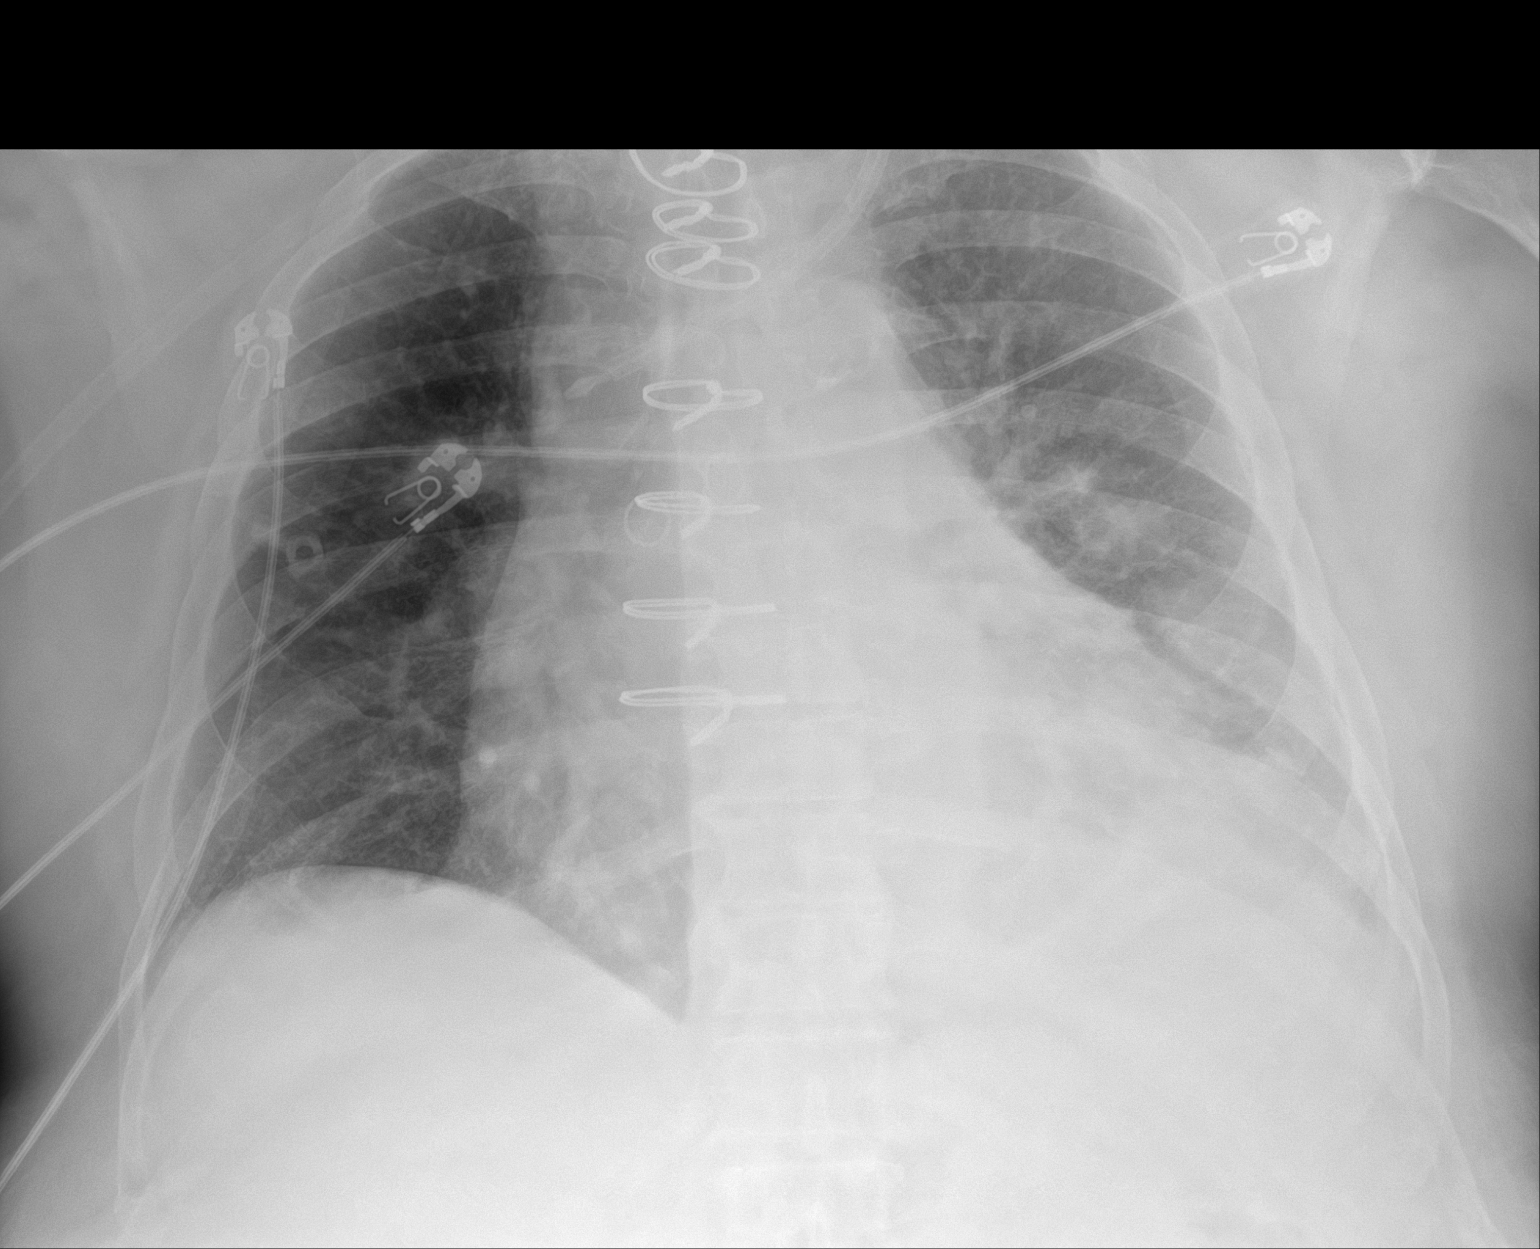

[1 of 1 positions shown; findings below may reference images not displayed]

FINDINGS: Status post median sternotomy and CABG. Cardiac silhouette is again
at least moderately enlarged. Mediastinal contours are within normal
limits. Calcification is again seen within the aortic arch.

Interval removal of right upper extremity PICC.

Redemonstration of left internal jugular central venous catheter
sheath with associated catheter tip overlying the superior vena
cava, unchanged.

Mild left interstitial thickening is similar to prior. Left basilar
heterogeneous opacification again likely corresponding to the
loculated left posterior pleural effusion and associated atelectasis
better seen on prior CT. No pneumothorax. Mild multilevel
degenerative disc changes of the thoracic spine.
IMPRESSION: 1. Interval removal of right upper extremity PICC.
2. Stable enlarged cardiac silhouette.
3. Mild left interstitial thickening and basilar heterogeneous
opacification are not significantly changed, corresponding to the
loculated left basilar pleural effusion better seen on prior CT.

## 2023-04-27 IMAGING — XA IR FLUORO GUIDE CV LINE*R*
1 series · 2 of 2 positions shown · non-contrast
Comparison: None Available.

CLINICAL DATA: Renal failure, needs durable venous access for
hemodialysis

EXAM:
TUNNELED HEMODIALYSIS CATHETER PLACEMENT WITH ULTRASOUND AND
FLUOROSCOPIC GUIDANCE
TECHNIQUE: The procedure, risks, benefits, and alternatives were explained to
the patient. Questions regarding the procedure were encouraged and
answered. The patient understands and consents to the procedure.

[Series 1: fl angio · 2 of 2 slices shown]
[im 1/2]
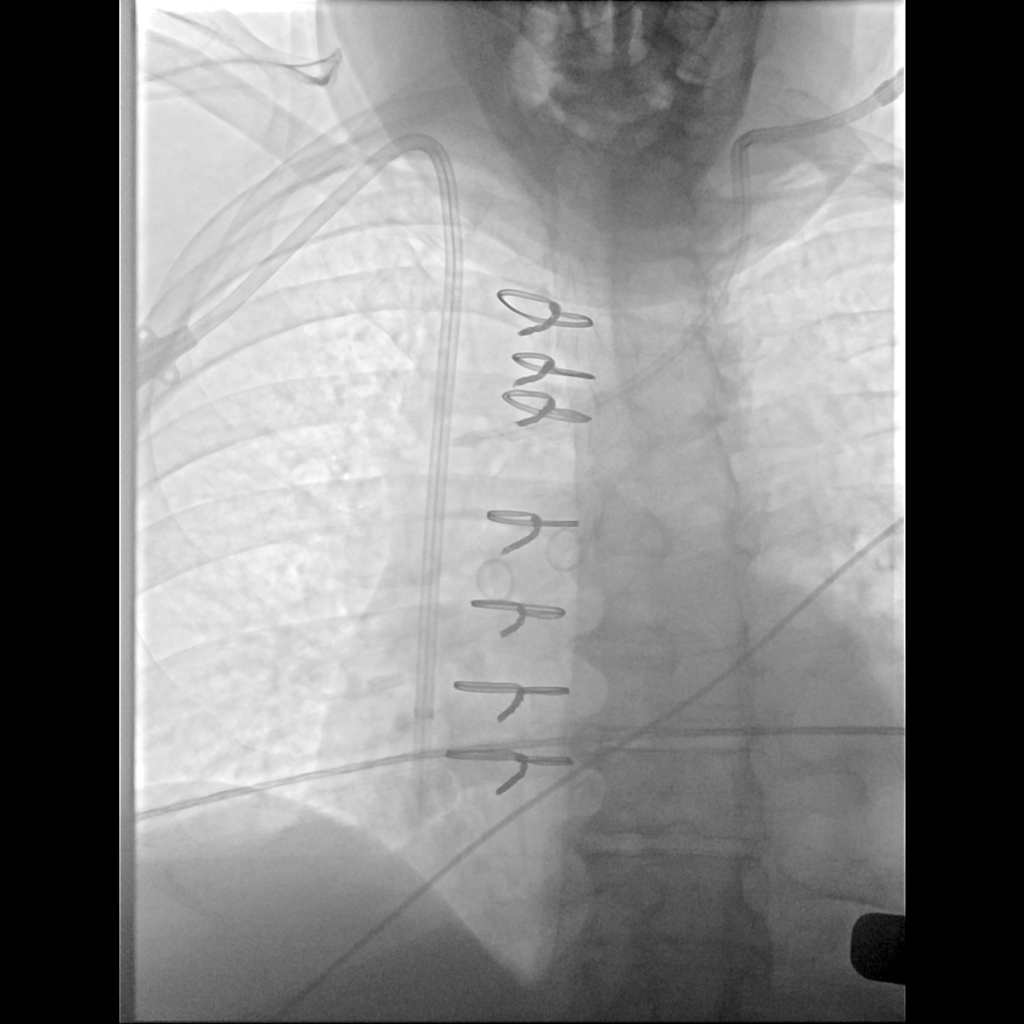
[im 2/2]
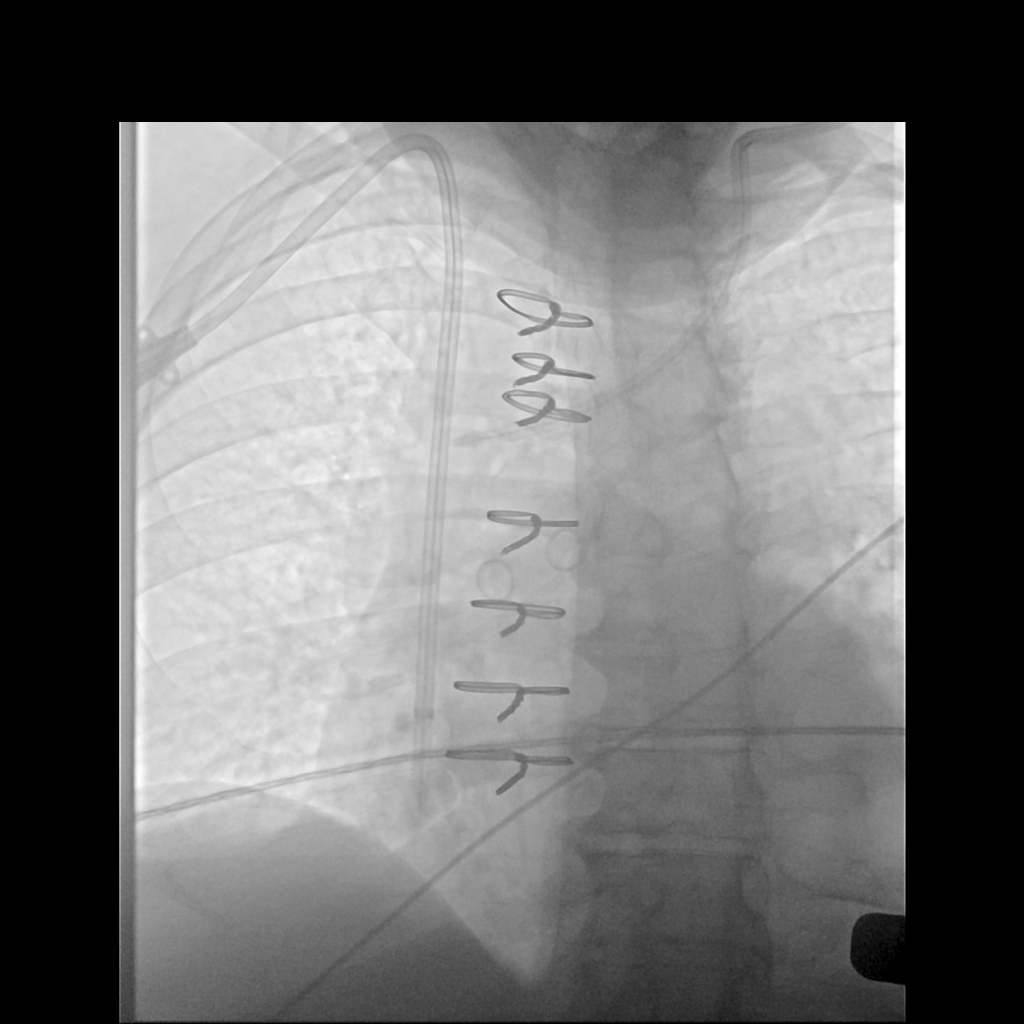

[2 of 2 positions shown; findings below may reference images not displayed]

As antibiotic prophylaxis, cefazolin 2 g was ordered pre-procedure
and administered intravenously within one hour of incision.Patency
of the right IJ vein was confirmed with ultrasound with image
documentation. An appropriate skin site was determined. Region was
prepped using maximum barrier technique including cap and mask,
sterile gown, sterile gloves, large sterile sheet, and Chlorhexidine
as cutaneous antisepsis. The region was infiltrated locally with 1%
lidocaine. sedationUnder real-time ultrasound guidance, the right IJ
vein was accessed with a 21 gauge micropuncture needle; the needle
tip within the vein was confirmed with ultrasound image
documentation. Needle exchanged over the 018 guidewire for
transitional dilator, which allowed advancement of a Benson wire
into the IVC. Over this, an MPA catheter was advanced. A Palindrome
23 hemodialysis catheter was tunneled from the right anterior chest
wall approach to the right IJ dermatotomy site. The MPA catheter was
exchanged over an Amplatz wire for serial vascular dilators which
allow placement of a peel-away sheath, through which the catheter
was advanced under intermittent fluoroscopy, positioned with its
tips in the proximal and midright atrium. Spot chest radiograph
confirms good catheter position. No pneumothorax. Catheter was
flushed and primed per protocol. Catheter secured externally with O
Prolene sutures. The right IJ dermatotomy site was closed with
Dermabond.

COMPLICATIONS:
COMPLICATIONS
None immediate

FLUOROSCOPY:
Radiation Exposure Index (as provided by the fluoroscopic device):
25 mGy air Kerma
IMPRESSION: 1. Technically successful placement of tunneled right IJ
hemodialysis catheter with ultrasound and fluoroscopic guidance.
Ready for routine use.

ACCESS:
Remains approachable for percutaneous intervention as needed.

## 2023-04-28 DIAGNOSIS — I5020 Unspecified systolic (congestive) heart failure: Secondary | ICD-10-CM | POA: Diagnosis not present

## 2023-04-28 DIAGNOSIS — Z992 Dependence on renal dialysis: Secondary | ICD-10-CM | POA: Diagnosis not present

## 2023-04-28 DIAGNOSIS — E1140 Type 2 diabetes mellitus with diabetic neuropathy, unspecified: Secondary | ICD-10-CM | POA: Diagnosis not present

## 2023-04-28 DIAGNOSIS — I4891 Unspecified atrial fibrillation: Secondary | ICD-10-CM | POA: Diagnosis not present

## 2023-04-28 DIAGNOSIS — Z1331 Encounter for screening for depression: Secondary | ICD-10-CM | POA: Diagnosis not present

## 2023-04-28 DIAGNOSIS — I2510 Atherosclerotic heart disease of native coronary artery without angina pectoris: Secondary | ICD-10-CM | POA: Diagnosis not present

## 2023-04-28 DIAGNOSIS — Z89512 Acquired absence of left leg below knee: Secondary | ICD-10-CM | POA: Diagnosis not present

## 2023-04-28 DIAGNOSIS — I739 Peripheral vascular disease, unspecified: Secondary | ICD-10-CM | POA: Diagnosis not present

## 2023-04-28 DIAGNOSIS — N186 End stage renal disease: Secondary | ICD-10-CM | POA: Diagnosis not present

## 2023-04-28 IMAGING — DX DG CHEST 1V PORT
1 series · 1 of 1 positions shown · non-contrast
Comparison: the previous day's study

CLINICAL DATA: Reason for exam: sob Patient denies any sob or chest
pains. Reports to have cardioversion today. Hx of cardioversion
11/25/2021, CABG x3 11/12/2021.

EXAM:
PORTABLE CHEST - 1 VIEW

[chest]
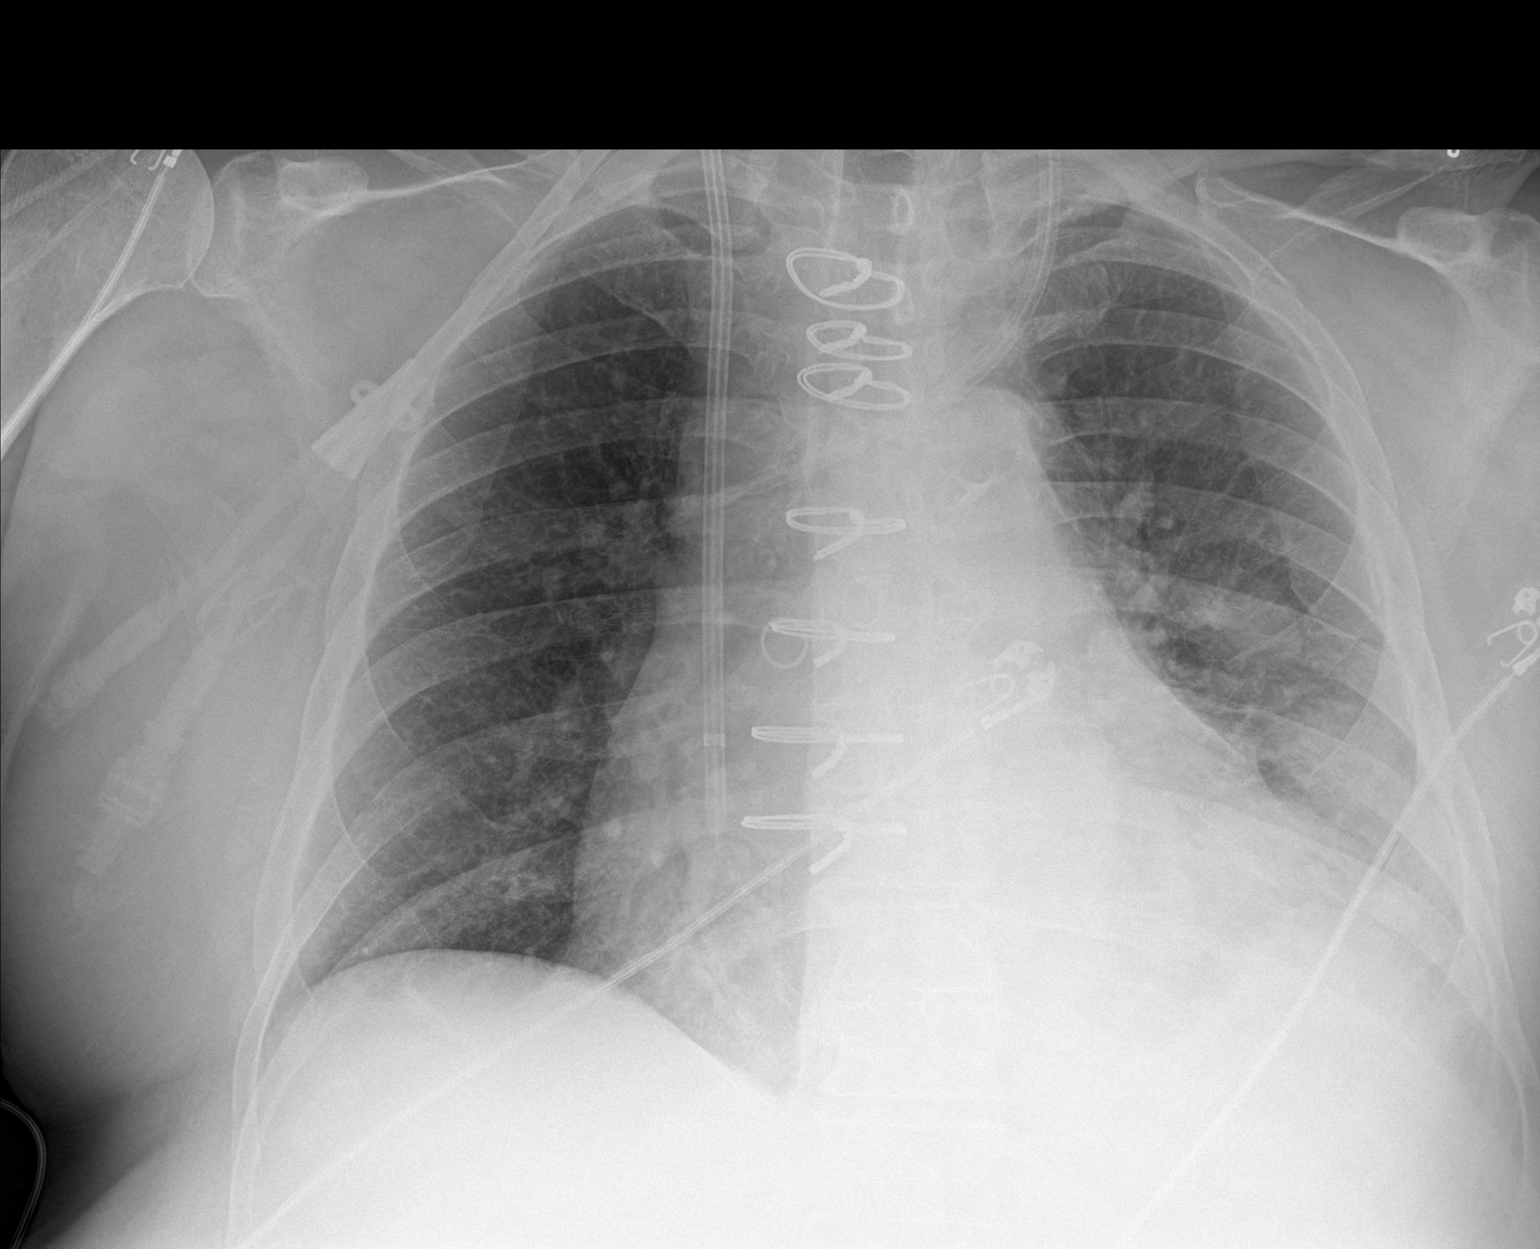

[1 of 1 positions shown; findings below may reference images not displayed]

FINDINGS: Tunneled right IJ hemodialysis catheter extends to the proximal RA.
Non tunneled left IJ catheter is directed towards the lateral wall
of the proximal SVC. No pneumothorax. Persistent patchy airspace
opacities in the mid and lower left lung. Right lung clear.

Heart size upper limits normal. CABG graft markers. Aortic
Atherosclerosis ([id number]).

No effusion.

Sternotomy wires.
IMPRESSION: Persistent patchy left mid and lower lung airspace opacities.

## 2023-04-30 IMAGING — DX DG CHEST 1V PORT
1 series · 1 of 1 positions shown · non-contrast
Comparison: 11/29/2021

CLINICAL DATA: Status post CABG.

EXAM:
PORTABLE CHEST 1 VIEW

[chest]
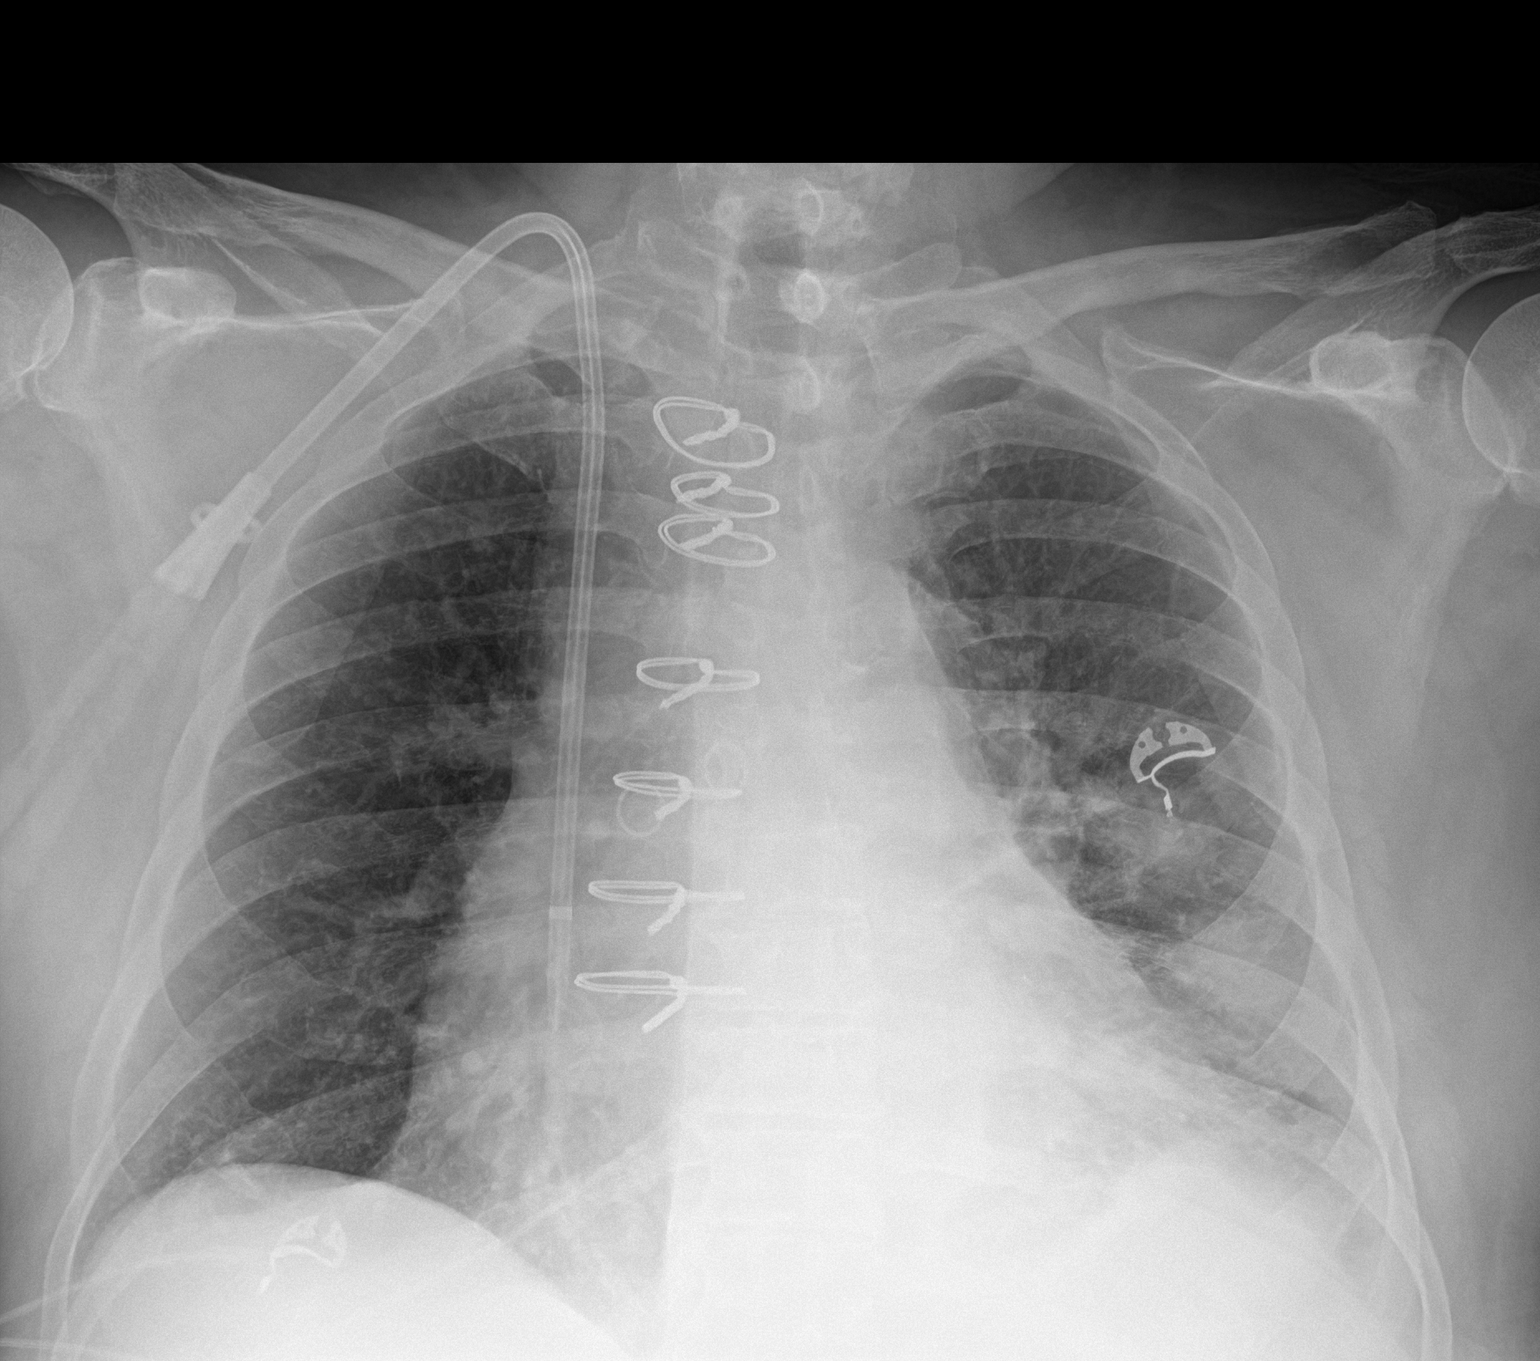

[1 of 1 positions shown; findings below may reference images not displayed]

FINDINGS: The cardio pericardial silhouette is enlarged. Similar appearance
patchy airspace disease at the left base. Right IJ central line tip
overlies the right atrium. Left IJ central line is been removed in
the interval. Telemetry leads overlie the chest.
IMPRESSION: 1. Interval removal of left IJ central line.
2. Similar appearance of patchy airspace disease at the left base.

## 2023-05-01 DIAGNOSIS — Z992 Dependence on renal dialysis: Secondary | ICD-10-CM | POA: Diagnosis not present

## 2023-05-01 DIAGNOSIS — N2581 Secondary hyperparathyroidism of renal origin: Secondary | ICD-10-CM | POA: Diagnosis not present

## 2023-05-01 DIAGNOSIS — N186 End stage renal disease: Secondary | ICD-10-CM | POA: Diagnosis not present

## 2023-05-01 DIAGNOSIS — R6889 Other general symptoms and signs: Secondary | ICD-10-CM | POA: Diagnosis not present

## 2023-05-04 DIAGNOSIS — N2581 Secondary hyperparathyroidism of renal origin: Secondary | ICD-10-CM | POA: Diagnosis not present

## 2023-05-04 DIAGNOSIS — N186 End stage renal disease: Secondary | ICD-10-CM | POA: Diagnosis not present

## 2023-05-04 DIAGNOSIS — R6889 Other general symptoms and signs: Secondary | ICD-10-CM | POA: Diagnosis not present

## 2023-05-04 DIAGNOSIS — Z992 Dependence on renal dialysis: Secondary | ICD-10-CM | POA: Diagnosis not present

## 2023-05-05 ENCOUNTER — Encounter: Payer: Self-pay | Admitting: Physician Assistant

## 2023-05-05 ENCOUNTER — Ambulatory Visit (INDEPENDENT_AMBULATORY_CARE_PROVIDER_SITE_OTHER): Payer: Medicare HMO | Admitting: Physician Assistant

## 2023-05-05 VITALS — BP 118/72 | HR 77 | Temp 98.6°F | Ht 70.0 in | Wt 230.0 lb

## 2023-05-05 DIAGNOSIS — I70262 Atherosclerosis of native arteries of extremities with gangrene, left leg: Secondary | ICD-10-CM

## 2023-05-05 MED ORDER — COLLAGENASE 250 UNIT/GM EX OINT: 1.0000 | TOPICAL_OINTMENT | Freq: Every day | CUTANEOUS | 1 refills | Status: DC

## 2023-05-05 NOTE — Progress Notes (Signed)
POST OPERATIVE OFFICE NOTE    CC:  F/u for surgery  HPI:  This is a 67 y.o. male who is s/p  right common femoral to below-knee popliteal artery bypass for CLI with several right foot wounds. -His right groin and right below-knee incisions are healing appropriately without signs of infection.    Pt returns today for follow up.  Pt states He has HH RN for wound checks and his wife helps change the dressings.  He is doing exercises daily to keep his upper body strength.  He feels well over all and denies fever and chills.    Allergies  Allergen Reactions   Tape     Plastic tape -needs paper tape    Current Outpatient Medications  Medication Sig Dispense Refill   collagenase (SANTYL) 250 UNIT/GM ointment Apply 1 Application topically daily. 90 each 1   albuterol (VENTOLIN HFA) 108 (90 Base) MCG/ACT inhaler Inhale 2 puffs into the lungs every 6 (six) hours as needed for wheezing or shortness of breath.     amiodarone (PACERONE) 200 MG tablet TAKE 1 TABLET TWICE DAILY 180 tablet 3   apixaban (ELIQUIS) 5 MG TABS tablet Take 1 tablet (5 mg total) by mouth 2 (two) times daily.     atorvastatin (LIPITOR) 80 MG tablet Take 1 tablet (80 mg total) by mouth daily.     B Complex-C-Folic Acid (DIALYVITE 800) 0.8 MG TABS Take 0.8 mg by mouth daily.     clopidogrel (PLAVIX) 75 MG tablet Take 1 tablet (75 mg total) by mouth daily. (Patient not taking: Reported on 04/21/2023)     Dulaglutide (TRULICITY) 3 MG/0.5ML SOPN Inject 3 mg into the skin every Wednesday.     fluticasone furoate-vilanterol (BREO ELLIPTA) 200-25 MCG/ACT AEPB Inhale 1 puff into the lungs daily.  3   insulin glargine (LANTUS) 100 UNIT/ML injection Inject 0.2 mLs (20 Units total) into the skin 2 (two) times daily.     Insulin Pen Needle 32G X 4 MM MISC Use to inject Levemir 2 (two) times daily. 100 each 1   Lactobacillus (ACIDOPHILUS) CAPS capsule Take 2 capsules by mouth daily. 100 capsule 0   Methoxy PEG-Epoetin Beta (MIRCERA IJ)  Mircera     midodrine (PROAMATINE) 5 MG tablet Take 1 tablet (5 mg total) by mouth daily. 90 tablet 3   oxyCODONE-acetaminophen (PERCOCET/ROXICET) 5-325 MG tablet Take 1 tablet by mouth every 6 (six) hours as needed for severe pain. 20 tablet 0   promethazine (PHENERGAN) 12.5 MG tablet Take 12.5 mg by mouth 3 (three) times daily as needed.     torsemide (DEMADEX) 20 MG tablet Take 1 tablet (20 mg total) by mouth 2 (two) times daily. 360 tablet 1   Current Facility-Administered Medications  Medication Dose Route Frequency Provider Last Rate Last Admin   0.9 %  sodium chloride infusion  250 mL Intravenous PRN Leonie Verle, MD       sodium chloride flush (NS) 0.9 % injection 3 mL  3 mL Intravenous Q12H Leonie Virat, MD         ROS:  See HPI  Physical Exam:         Hypergranulation left BKA wound, I used silver nitrate to cover the area to reduce the granulation and allow the skin to cover the wound.  He tolerated this well.  Dr Florentina Addison, Janese Banks and ace wrap applied.  The heel wound and dorsal foot wound appear to be smaller in diameter.  There is eschar covering  the wounds.  Foot dorsum is erythema/mixed ecchymosis.  Skin is warm and motor is intact.     Assessment/Plan:  This is a 67 y.o. male who is s/p: right common femoral to below-knee popliteal artery bypass for CLI with several right foot wounds. -His right groin and right below-knee incisions are healing appropriately without signs of infection    -wounds appear to be healing and have smaller diameters.  Hypergranulation left BKA treated with silver nitrate.  The right heel wound we to dry and dorsal foot wound dry dressing.  I have prescribed santyl to both right foot wounds to help debride the eschar.  Between dressing changes he should get leuk warm water in a basin and wash his right foot with soap and water.  Dry the skin and in between the toes well then re apply the dressings as prescribed.  He will f/u in 3 weeks  for wound checks.  They will call with any concerns.     Mosetta Pigeon PA-C [facility] [phone]   Clinic MD:  Chestine Spore

## 2023-05-07 DIAGNOSIS — I70223 Atherosclerosis of native arteries of extremities with rest pain, bilateral legs: Secondary | ICD-10-CM | POA: Diagnosis not present

## 2023-05-07 DIAGNOSIS — Z89512 Acquired absence of left leg below knee: Secondary | ICD-10-CM | POA: Diagnosis not present

## 2023-05-07 DIAGNOSIS — T8744 Infection of amputation stump, left lower extremity: Secondary | ICD-10-CM | POA: Diagnosis not present

## 2023-05-07 DIAGNOSIS — E1151 Type 2 diabetes mellitus with diabetic peripheral angiopathy without gangrene: Secondary | ICD-10-CM | POA: Diagnosis not present

## 2023-05-07 DIAGNOSIS — L89510 Pressure ulcer of right ankle, unstageable: Secondary | ICD-10-CM | POA: Diagnosis not present

## 2023-05-07 DIAGNOSIS — L89610 Pressure ulcer of right heel, unstageable: Secondary | ICD-10-CM | POA: Diagnosis not present

## 2023-05-07 DIAGNOSIS — E1140 Type 2 diabetes mellitus with diabetic neuropathy, unspecified: Secondary | ICD-10-CM | POA: Diagnosis not present

## 2023-05-07 DIAGNOSIS — Z48812 Encounter for surgical aftercare following surgery on the circulatory system: Secondary | ICD-10-CM | POA: Diagnosis not present

## 2023-05-07 DIAGNOSIS — I110 Hypertensive heart disease with heart failure: Secondary | ICD-10-CM | POA: Diagnosis not present

## 2023-05-08 DIAGNOSIS — R6889 Other general symptoms and signs: Secondary | ICD-10-CM | POA: Diagnosis not present

## 2023-05-08 DIAGNOSIS — N2581 Secondary hyperparathyroidism of renal origin: Secondary | ICD-10-CM | POA: Diagnosis not present

## 2023-05-08 DIAGNOSIS — N186 End stage renal disease: Secondary | ICD-10-CM | POA: Diagnosis not present

## 2023-05-08 DIAGNOSIS — Z992 Dependence on renal dialysis: Secondary | ICD-10-CM | POA: Diagnosis not present

## 2023-05-11 DIAGNOSIS — Z992 Dependence on renal dialysis: Secondary | ICD-10-CM | POA: Diagnosis not present

## 2023-05-11 DIAGNOSIS — N186 End stage renal disease: Secondary | ICD-10-CM | POA: Diagnosis not present

## 2023-05-11 DIAGNOSIS — N2581 Secondary hyperparathyroidism of renal origin: Secondary | ICD-10-CM | POA: Diagnosis not present

## 2023-05-11 DIAGNOSIS — R6889 Other general symptoms and signs: Secondary | ICD-10-CM | POA: Diagnosis not present

## 2023-05-12 DIAGNOSIS — L89510 Pressure ulcer of right ankle, unstageable: Secondary | ICD-10-CM | POA: Diagnosis not present

## 2023-05-12 DIAGNOSIS — I110 Hypertensive heart disease with heart failure: Secondary | ICD-10-CM | POA: Diagnosis not present

## 2023-05-12 DIAGNOSIS — T8744 Infection of amputation stump, left lower extremity: Secondary | ICD-10-CM | POA: Diagnosis not present

## 2023-05-12 DIAGNOSIS — Z89512 Acquired absence of left leg below knee: Secondary | ICD-10-CM | POA: Diagnosis not present

## 2023-05-12 DIAGNOSIS — L89610 Pressure ulcer of right heel, unstageable: Secondary | ICD-10-CM | POA: Diagnosis not present

## 2023-05-12 DIAGNOSIS — Z48812 Encounter for surgical aftercare following surgery on the circulatory system: Secondary | ICD-10-CM | POA: Diagnosis not present

## 2023-05-12 DIAGNOSIS — E1140 Type 2 diabetes mellitus with diabetic neuropathy, unspecified: Secondary | ICD-10-CM | POA: Diagnosis not present

## 2023-05-12 DIAGNOSIS — I70223 Atherosclerosis of native arteries of extremities with rest pain, bilateral legs: Secondary | ICD-10-CM | POA: Diagnosis not present

## 2023-05-12 DIAGNOSIS — E1151 Type 2 diabetes mellitus with diabetic peripheral angiopathy without gangrene: Secondary | ICD-10-CM | POA: Diagnosis not present

## 2023-05-14 DIAGNOSIS — Z48812 Encounter for surgical aftercare following surgery on the circulatory system: Secondary | ICD-10-CM | POA: Diagnosis not present

## 2023-05-14 DIAGNOSIS — I70223 Atherosclerosis of native arteries of extremities with rest pain, bilateral legs: Secondary | ICD-10-CM | POA: Diagnosis not present

## 2023-05-14 DIAGNOSIS — E1140 Type 2 diabetes mellitus with diabetic neuropathy, unspecified: Secondary | ICD-10-CM | POA: Diagnosis not present

## 2023-05-14 DIAGNOSIS — I110 Hypertensive heart disease with heart failure: Secondary | ICD-10-CM | POA: Diagnosis not present

## 2023-05-14 DIAGNOSIS — L89510 Pressure ulcer of right ankle, unstageable: Secondary | ICD-10-CM | POA: Diagnosis not present

## 2023-05-14 DIAGNOSIS — T8744 Infection of amputation stump, left lower extremity: Secondary | ICD-10-CM | POA: Diagnosis not present

## 2023-05-14 DIAGNOSIS — L89610 Pressure ulcer of right heel, unstageable: Secondary | ICD-10-CM | POA: Diagnosis not present

## 2023-05-14 DIAGNOSIS — E1151 Type 2 diabetes mellitus with diabetic peripheral angiopathy without gangrene: Secondary | ICD-10-CM | POA: Diagnosis not present

## 2023-05-14 DIAGNOSIS — Z89512 Acquired absence of left leg below knee: Secondary | ICD-10-CM | POA: Diagnosis not present

## 2023-05-15 DIAGNOSIS — Z992 Dependence on renal dialysis: Secondary | ICD-10-CM | POA: Diagnosis not present

## 2023-05-15 DIAGNOSIS — N186 End stage renal disease: Secondary | ICD-10-CM | POA: Diagnosis not present

## 2023-05-15 DIAGNOSIS — N2581 Secondary hyperparathyroidism of renal origin: Secondary | ICD-10-CM | POA: Diagnosis not present

## 2023-05-15 DIAGNOSIS — R6889 Other general symptoms and signs: Secondary | ICD-10-CM | POA: Diagnosis not present

## 2023-05-19 DIAGNOSIS — N2581 Secondary hyperparathyroidism of renal origin: Secondary | ICD-10-CM | POA: Diagnosis not present

## 2023-05-19 DIAGNOSIS — I110 Hypertensive heart disease with heart failure: Secondary | ICD-10-CM | POA: Diagnosis not present

## 2023-05-19 DIAGNOSIS — E1151 Type 2 diabetes mellitus with diabetic peripheral angiopathy without gangrene: Secondary | ICD-10-CM | POA: Diagnosis not present

## 2023-05-19 DIAGNOSIS — I70223 Atherosclerosis of native arteries of extremities with rest pain, bilateral legs: Secondary | ICD-10-CM | POA: Diagnosis not present

## 2023-05-19 DIAGNOSIS — Z48812 Encounter for surgical aftercare following surgery on the circulatory system: Secondary | ICD-10-CM | POA: Diagnosis not present

## 2023-05-19 DIAGNOSIS — L89610 Pressure ulcer of right heel, unstageable: Secondary | ICD-10-CM | POA: Diagnosis not present

## 2023-05-19 DIAGNOSIS — N186 End stage renal disease: Secondary | ICD-10-CM | POA: Diagnosis not present

## 2023-05-19 DIAGNOSIS — L89510 Pressure ulcer of right ankle, unstageable: Secondary | ICD-10-CM | POA: Diagnosis not present

## 2023-05-19 DIAGNOSIS — Z992 Dependence on renal dialysis: Secondary | ICD-10-CM | POA: Diagnosis not present

## 2023-05-19 DIAGNOSIS — T8744 Infection of amputation stump, left lower extremity: Secondary | ICD-10-CM | POA: Diagnosis not present

## 2023-05-19 DIAGNOSIS — Z89512 Acquired absence of left leg below knee: Secondary | ICD-10-CM | POA: Diagnosis not present

## 2023-05-19 DIAGNOSIS — E1140 Type 2 diabetes mellitus with diabetic neuropathy, unspecified: Secondary | ICD-10-CM | POA: Diagnosis not present

## 2023-05-22 DIAGNOSIS — R6889 Other general symptoms and signs: Secondary | ICD-10-CM | POA: Diagnosis not present

## 2023-05-22 DIAGNOSIS — N2581 Secondary hyperparathyroidism of renal origin: Secondary | ICD-10-CM | POA: Diagnosis not present

## 2023-05-22 DIAGNOSIS — Z992 Dependence on renal dialysis: Secondary | ICD-10-CM | POA: Diagnosis not present

## 2023-05-22 DIAGNOSIS — I70223 Atherosclerosis of native arteries of extremities with rest pain, bilateral legs: Secondary | ICD-10-CM | POA: Diagnosis not present

## 2023-05-22 DIAGNOSIS — Z48812 Encounter for surgical aftercare following surgery on the circulatory system: Secondary | ICD-10-CM | POA: Diagnosis not present

## 2023-05-22 DIAGNOSIS — L89610 Pressure ulcer of right heel, unstageable: Secondary | ICD-10-CM | POA: Diagnosis not present

## 2023-05-22 DIAGNOSIS — E1151 Type 2 diabetes mellitus with diabetic peripheral angiopathy without gangrene: Secondary | ICD-10-CM | POA: Diagnosis not present

## 2023-05-22 DIAGNOSIS — E1140 Type 2 diabetes mellitus with diabetic neuropathy, unspecified: Secondary | ICD-10-CM | POA: Diagnosis not present

## 2023-05-22 DIAGNOSIS — Z89512 Acquired absence of left leg below knee: Secondary | ICD-10-CM | POA: Diagnosis not present

## 2023-05-22 DIAGNOSIS — N186 End stage renal disease: Secondary | ICD-10-CM | POA: Diagnosis not present

## 2023-05-22 DIAGNOSIS — L89510 Pressure ulcer of right ankle, unstageable: Secondary | ICD-10-CM | POA: Diagnosis not present

## 2023-05-22 DIAGNOSIS — T8744 Infection of amputation stump, left lower extremity: Secondary | ICD-10-CM | POA: Diagnosis not present

## 2023-05-22 DIAGNOSIS — I110 Hypertensive heart disease with heart failure: Secondary | ICD-10-CM | POA: Diagnosis not present

## 2023-05-23 DIAGNOSIS — N179 Acute kidney failure, unspecified: Secondary | ICD-10-CM | POA: Diagnosis not present

## 2023-05-23 DIAGNOSIS — N186 End stage renal disease: Secondary | ICD-10-CM | POA: Diagnosis not present

## 2023-05-23 DIAGNOSIS — Z992 Dependence on renal dialysis: Secondary | ICD-10-CM | POA: Diagnosis not present

## 2023-05-25 DIAGNOSIS — Z992 Dependence on renal dialysis: Secondary | ICD-10-CM | POA: Diagnosis not present

## 2023-05-25 DIAGNOSIS — R6889 Other general symptoms and signs: Secondary | ICD-10-CM | POA: Diagnosis not present

## 2023-05-25 DIAGNOSIS — N186 End stage renal disease: Secondary | ICD-10-CM | POA: Diagnosis not present

## 2023-05-25 DIAGNOSIS — N2581 Secondary hyperparathyroidism of renal origin: Secondary | ICD-10-CM | POA: Diagnosis not present

## 2023-05-26 ENCOUNTER — Telehealth: Payer: Self-pay | Admitting: *Deleted

## 2023-05-26 DIAGNOSIS — I110 Hypertensive heart disease with heart failure: Secondary | ICD-10-CM | POA: Diagnosis not present

## 2023-05-26 DIAGNOSIS — Z48812 Encounter for surgical aftercare following surgery on the circulatory system: Secondary | ICD-10-CM | POA: Diagnosis not present

## 2023-05-26 DIAGNOSIS — L89510 Pressure ulcer of right ankle, unstageable: Secondary | ICD-10-CM | POA: Diagnosis not present

## 2023-05-26 DIAGNOSIS — E1151 Type 2 diabetes mellitus with diabetic peripheral angiopathy without gangrene: Secondary | ICD-10-CM | POA: Diagnosis not present

## 2023-05-26 DIAGNOSIS — I70223 Atherosclerosis of native arteries of extremities with rest pain, bilateral legs: Secondary | ICD-10-CM | POA: Diagnosis not present

## 2023-05-26 DIAGNOSIS — E1140 Type 2 diabetes mellitus with diabetic neuropathy, unspecified: Secondary | ICD-10-CM | POA: Diagnosis not present

## 2023-05-26 DIAGNOSIS — L89610 Pressure ulcer of right heel, unstageable: Secondary | ICD-10-CM | POA: Diagnosis not present

## 2023-05-26 DIAGNOSIS — T8744 Infection of amputation stump, left lower extremity: Secondary | ICD-10-CM | POA: Diagnosis not present

## 2023-05-26 DIAGNOSIS — Z89512 Acquired absence of left leg below knee: Secondary | ICD-10-CM | POA: Diagnosis not present

## 2023-05-26 NOTE — Telephone Encounter (Signed)
April from [facility] called concern of  right foot wound , increase in size with tendon exposed and odor.Stated pt has been feeling weak the past couple days. Called pt to work pt up today in regard to concern .Mr. Nomura stated he was unable to come today so new appointment is scheduled for 05/27/23 @ 1200 for ABI study and  follow up with PA. Pt encouraged to call EMS if worsening symptoms.

## 2023-05-27 ENCOUNTER — Ambulatory Visit (HOSPITAL_COMMUNITY)
Admission: RE | Admit: 2023-05-27 | Discharge: 2023-05-27 | Disposition: A | Discharge status: Home / Self Care | Payer: Medicare HMO | Source: Ambulatory Visit | Attending: Vascular Surgery | Admitting: Vascular Surgery

## 2023-05-27 ENCOUNTER — Ambulatory Visit (INDEPENDENT_AMBULATORY_CARE_PROVIDER_SITE_OTHER)
Admission: RE | Admit: 2023-05-27 | Discharge: 2023-05-27 | Disposition: A | Discharge status: Home / Self Care | Payer: Medicare HMO | Source: Ambulatory Visit | Attending: Physician Assistant | Admitting: Physician Assistant

## 2023-05-27 ENCOUNTER — Ambulatory Visit (INDEPENDENT_AMBULATORY_CARE_PROVIDER_SITE_OTHER): Payer: Medicare HMO

## 2023-05-27 ENCOUNTER — Other Ambulatory Visit: Payer: Self-pay | Admitting: *Deleted

## 2023-05-27 VITALS — BP 126/53 | HR 76 | Temp 98.7°F

## 2023-05-27 DIAGNOSIS — S91301A Unspecified open wound, right foot, initial encounter: Secondary | ICD-10-CM

## 2023-05-27 DIAGNOSIS — I70435 Atherosclerosis of autologous vein bypass graft(s) of the right leg with ulceration of other part of foot: Secondary | ICD-10-CM

## 2023-05-27 LAB — VAS US ABI WITH/WO TBI

## 2023-05-27 MED ORDER — CEPHALEXIN 500 MG PO CAPS: 500.0000 mg | ORAL_CAPSULE | Freq: Two times a day (BID) | ORAL | 0 refills | Status: DC

## 2023-05-27 NOTE — Progress Notes (Signed)
POST OPERATIVE OFFICE NOTE    CC:  F/u for surgery  HPI:  This is a 67 y.o. male who is s/p right common femoral to below knee popliteal artery bypass on 03/19/23 by Dr. Lenell Antu. This was for CLI with wounds. The wounds have been managed by [facility] and the patients wife. 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 At his last visit on 05/05/23 the wounds appeared to be improving slightly. His hypergranulation on the left BKA was treated with silver nitrate. He was given very specific instructions about wound care.  Today he explains that over the past week the right foot has started to look worse. HH called yesterday with concerns for the right foot wound increasing in size with exposed tendon and malodor. He reports generalized fatigue. No fever or chills.   Allergies  Allergen Reactions   Tape     Plastic tape -needs paper tape    Current Outpatient Medications  Medication Sig Dispense Refill   albuterol (VENTOLIN HFA) 108 (90 Base) MCG/ACT inhaler Inhale 2 puffs into the lungs every 6 (six) hours as needed for wheezing or shortness of breath.     amiodarone (PACERONE) 200 MG tablet TAKE 1 TABLET TWICE DAILY 180 tablet 3   apixaban (ELIQUIS) 5 MG TABS tablet Take 1 tablet (5 mg total) by mouth 2 (two) times daily.     atorvastatin (LIPITOR) 80 MG tablet Take 1 tablet (80 mg total) by mouth daily.     B Complex-C-Folic Acid (DIALYVITE 800) 0.8 MG TABS Take 0.8 mg by mouth daily.     clopidogrel (PLAVIX) 75 MG tablet Take 1 tablet (75 mg total) by mouth daily.     collagenase (SANTYL) 250 UNIT/GM ointment Apply 1 Application topically daily. 90 each 1   Dulaglutide (TRULICITY) 3 MG/0.5ML SOPN Inject 3 mg into the skin every Wednesday.     fluticasone furoate-vilanterol (BREO ELLIPTA) 200-25 MCG/ACT AEPB Inhale 1 puff into the lungs daily.  3   insulin glargine (LANTUS) 100 UNIT/ML injection Inject 0.2 mLs (20 Units total) into the skin 2 (two) times daily.     Insulin Pen Needle 32G X 4  MM MISC Use to inject Levemir 2 (two) times daily. 100 each 1   Lactobacillus (ACIDOPHILUS) CAPS capsule Take 2 capsules by mouth daily. 100 capsule 0   Methoxy PEG-Epoetin Beta (MIRCERA IJ) Mircera     midodrine (PROAMATINE) 5 MG tablet Take 1 tablet (5 mg total) by mouth daily. 90 tablet 3   oxyCODONE-acetaminophen (PERCOCET/ROXICET) 5-325 MG tablet Take 1 tablet by mouth every 6 (six) hours as needed for severe pain. 20 tablet 0   promethazine (PHENERGAN) 12.5 MG tablet Take 12.5 mg by mouth 3 (three) times daily as needed.     torsemide (DEMADEX) 20 MG tablet Take 1 tablet (20 mg total) by mouth 2 (two) times daily. 360 tablet 1   Current Facility-Administered Medications  Medication Dose Route Frequency Provider Last Rate Last Admin   0.9 %  sodium chloride infusion  250 mL Intravenous PRN Leonie Regnald, MD       sodium chloride flush (NS) 0.9 % injection 3 mL  3 mL Intravenous Q12H Leonie Quy, MD         ROS:  See HPI  Physical Exam:  Vitals:   05/27/23 1241  BP: (!) 126/53  Pulse: 76  Temp: 98.7 F (37.1 C)  SpO2: 91%    General: chronically ill appearing, in no acute  distress Incision:  Right leg incisions all healed Extremities:  Doppler right Dp and PT signals. Right foot is warm. Right heel ulcera and lateral malleolus ulceration appear somewhat improved as shown below. Dorsum of right foot wound worsening with exposed tendon. Drainage present. Pocket with fluid present. Left BKA with hypergranulation tissue as shown below. This does appear to be getting smaller      Neuro: alert and oriented  Non invasive Vascular lab: -------+-----------+-----------+------------+------------+  ABI/TBIToday's ABIToday's TBIPrevious ABIPrevious TBI  +-------+-----------+-----------+------------+------------+  Right [place]         absent     Thermal          0.61          +-------+-----------+-----------+------------+------------+  Left  BKA        BKA        BKA          BKA           +-------+-----------+-----------+------------+------------+   Right Graft #1: fem-pop  +------------------+--------+---------------+----------+----------------+                   PSV cm/sStenosis       Waveform  Comments          +------------------+--------+---------------+----------+----------------+  Inflow           113                    biphasic                    +------------------+--------+---------------+----------+----------------+  Prox Anastomosis  181     50-70% stenosismonophasiclow end of range  +------------------+--------+---------------+----------+----------------+  Proximal Graft    187     50-70% stenosismonophasiclow end of range  +------------------+--------+---------------+----------+----------------+  Mid Graft         145                    monophasic                  +------------------+--------+---------------+----------+----------------+  Distal Graft      153                    monophasic                  +------------------+--------+---------------+----------+----------------+  Distal Anastomosis182     50-70% stenosis          low end of range  +------------------+--------+---------------+----------+----------------+  Outflow          200     50-74% stenosis          low end of range  +------------------+--------+---------------+----------+----------------+   Summary:  Right: Bypass graft is patent with velocities at the low end of the 50-70% range involving the proximal anastomosis, proximal graft, distal graft and outflow. Velocities have increased since the last exam on 04/14/2023.   Assessment/Plan:  This is a 67 y.o. male who is s/p: right common femoral to below knee popliteal artery bypass on 03/19/23 by Dr. Lenell Antu. This was for CLI with wounds. The wound on the dorsum of the right foot has worsened over the past week with exposed tendon. The right heel wounds appear improved as does  the left BKA wound. The right foot is well perfused with patent bypass and doppler PT and DP signals. There are elevated velocities in the bypass graft but these are elevated throughout and monophasic flow has been present since the bypass was created. I had lengthy discussion with patient and  his wife about high likelihood that despite the perfusion being adequate that with the amount of tissue loss he will require likely a right above knee amputation. They do not want to consider that at this time.  - Left BKA is very slowly healing with healthy appearing granulation tissue - Continue daily dressing changes to right foot and left BKA stump. Recommend Xeroform on dorsum of foot and hydrogel or santyl on the right heel followed by gauze and ACE - Prescription for Keflex 500 mg BID sent to his pharmacy x 14 days - Will have him keep his follow up with Dr. Lenell Antu on 06/02/23 to discuss further management plans    Nathanial Rancher, [facility] [phone]   Clinic MD:  Randie Heinz

## 2023-05-28 DIAGNOSIS — L89610 Pressure ulcer of right heel, unstageable: Secondary | ICD-10-CM | POA: Diagnosis not present

## 2023-05-28 DIAGNOSIS — E1151 Type 2 diabetes mellitus with diabetic peripheral angiopathy without gangrene: Secondary | ICD-10-CM | POA: Diagnosis not present

## 2023-05-28 DIAGNOSIS — L89510 Pressure ulcer of right ankle, unstageable: Secondary | ICD-10-CM | POA: Diagnosis not present

## 2023-05-28 DIAGNOSIS — E1140 Type 2 diabetes mellitus with diabetic neuropathy, unspecified: Secondary | ICD-10-CM | POA: Diagnosis not present

## 2023-05-28 DIAGNOSIS — Z89512 Acquired absence of left leg below knee: Secondary | ICD-10-CM | POA: Diagnosis not present

## 2023-05-28 DIAGNOSIS — T8744 Infection of amputation stump, left lower extremity: Secondary | ICD-10-CM | POA: Diagnosis not present

## 2023-05-28 DIAGNOSIS — I70223 Atherosclerosis of native arteries of extremities with rest pain, bilateral legs: Secondary | ICD-10-CM | POA: Diagnosis not present

## 2023-05-28 DIAGNOSIS — I110 Hypertensive heart disease with heart failure: Secondary | ICD-10-CM | POA: Diagnosis not present

## 2023-05-28 DIAGNOSIS — Z48812 Encounter for surgical aftercare following surgery on the circulatory system: Secondary | ICD-10-CM | POA: Diagnosis not present

## 2023-05-29 DIAGNOSIS — N186 End stage renal disease: Secondary | ICD-10-CM | POA: Diagnosis not present

## 2023-05-29 DIAGNOSIS — N2581 Secondary hyperparathyroidism of renal origin: Secondary | ICD-10-CM | POA: Diagnosis not present

## 2023-05-29 DIAGNOSIS — Z992 Dependence on renal dialysis: Secondary | ICD-10-CM | POA: Diagnosis not present

## 2023-06-01 DIAGNOSIS — N186 End stage renal disease: Secondary | ICD-10-CM | POA: Diagnosis not present

## 2023-06-01 DIAGNOSIS — N2581 Secondary hyperparathyroidism of renal origin: Secondary | ICD-10-CM | POA: Diagnosis not present

## 2023-06-01 DIAGNOSIS — Z992 Dependence on renal dialysis: Secondary | ICD-10-CM | POA: Diagnosis not present

## 2023-06-02 ENCOUNTER — Ambulatory Visit (INDEPENDENT_AMBULATORY_CARE_PROVIDER_SITE_OTHER): Payer: Medicare HMO | Admitting: Physician Assistant

## 2023-06-02 VITALS — BP 100/43 | HR 70 | Temp 97.8°F | Resp 18 | Ht 70.0 in | Wt 250.0 lb

## 2023-06-02 DIAGNOSIS — S91301A Unspecified open wound, right foot, initial encounter: Secondary | ICD-10-CM

## 2023-06-02 MED ORDER — CEPHALEXIN 500 MG PO CAPS: 500.0000 mg | ORAL_CAPSULE | Freq: Three times a day (TID) | ORAL | 1 refills | Status: DC

## 2023-06-02 NOTE — Progress Notes (Signed)
POST OPERATIVE OFFICE NOTE    CC:  F/u for surgery  HPI:  This is a 67 y.o. male who is s/pright common femoral to below knee popliteal artery bypass on 03/19/23 by Dr. Lenell Antu. This was for CLI with wounds. The wounds have been managed by [facility] and the patients wife. 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  The BKA was performed by Dr. Lenell Antu and revised by DR. Dickson on 02/10/23.    He and his wife are doing dressing changes.  He is taking Keflex as well since his last visit.  He does not have much sensation in his right foot and denies pain.  He denies fever and chills.     Allergies  Allergen Reactions   Tape     Plastic tape -needs paper tape    Current Outpatient Medications  Medication Sig Dispense Refill   albuterol (VENTOLIN HFA) 108 (90 Base) MCG/ACT inhaler Inhale 2 puffs into the lungs every 6 (six) hours as needed for wheezing or shortness of breath.     amiodarone (PACERONE) 200 MG tablet TAKE 1 TABLET TWICE DAILY 180 tablet 3   apixaban (ELIQUIS) 5 MG TABS tablet Take 1 tablet (5 mg total) by mouth 2 (two) times daily.     atorvastatin (LIPITOR) 80 MG tablet Take 1 tablet (80 mg total) by mouth daily.     B Complex-C-Folic Acid (DIALYVITE 800) 0.8 MG TABS Take 0.8 mg by mouth daily.     cephALEXin (KEFLEX) 500 MG capsule Take 1 capsule (500 mg total) by mouth 2 (two) times daily. 14 capsule 0   clopidogrel (PLAVIX) 75 MG tablet Take 1 tablet (75 mg total) by mouth daily.     collagenase (SANTYL) 250 UNIT/GM ointment Apply 1 Application topically daily. 90 each 1   Dulaglutide (TRULICITY) 3 MG/0.5ML SOPN Inject 3 mg into the skin every Wednesday.     fluticasone furoate-vilanterol (BREO ELLIPTA) 200-25 MCG/ACT AEPB Inhale 1 puff into the lungs daily.  3   insulin glargine (LANTUS) 100 UNIT/ML injection Inject 0.2 mLs (20 Units total) into the skin 2 (two) times daily.     Insulin Pen Needle 32G X 4 MM MISC Use to inject Levemir 2 (two) times daily. 100 each 1    Lactobacillus (ACIDOPHILUS) CAPS capsule Take 2 capsules by mouth daily. 100 capsule 0   Methoxy PEG-Epoetin Beta (MIRCERA IJ) Mircera     midodrine (PROAMATINE) 5 MG tablet Take 1 tablet (5 mg total) by mouth daily. 90 tablet 3   oxyCODONE-acetaminophen (PERCOCET/ROXICET) 5-325 MG tablet Take 1 tablet by mouth every 6 (six) hours as needed for severe pain. 20 tablet 0   promethazine (PHENERGAN) 12.5 MG tablet Take 12.5 mg by mouth 3 (three) times daily as needed.     torsemide (DEMADEX) 20 MG tablet Take 1 tablet (20 mg total) by mouth 2 (two) times daily. 360 tablet 1   Current Facility-Administered Medications  Medication Dose Route Frequency Provider Last Rate Last Admin   0.9 %  sodium chloride infusion  250 mL Intravenous PRN Leonie Kia, MD       sodium chloride flush (NS) 0.9 % injection 3 mL  3 mL Intravenous Q12H Leonie Chandlar, MD         ROS:  See HPI  Physical Exam:    Silver nitrate applied to the left BKA open wound.             He has a doppler  signal in the AT so the bypass is still open.   There is exposed tendon on the dorsum pf the right foot and th wound bed has gotten larger.      Assessment/Plan:  This is a 67 y.o. male who is s/pright common femoral to below knee popliteal artery bypass on 03/19/23 by Dr. Lenell Antu. This was for CLI with wounds. The wounds have been managed by [facility] and the patients wife. 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  The BKA was performed by Dr. Lenell Antu and revised by DR. Dickson on 02/10/23.     -Continue current wound care.  He has a high likelihood of right LE limb loss even with an open bypass.  He stated that "if he losses the right leg too he will just give up!"  We will refer him to plastic surgery for a second opinion as last attempt to assist with right foot healing.    He will follow up with our office in 2-3 weeks.  He has refills for his Keflex.   Mosetta Pigeon PA-C [facility] [phone]   Clinic MD:  Lenell Antu

## 2023-06-04 DIAGNOSIS — L97519 Non-pressure chronic ulcer of other part of right foot with unspecified severity: Secondary | ICD-10-CM | POA: Diagnosis not present

## 2023-06-04 DIAGNOSIS — Z89512 Acquired absence of left leg below knee: Secondary | ICD-10-CM | POA: Diagnosis not present

## 2023-06-04 DIAGNOSIS — L89610 Pressure ulcer of right heel, unstageable: Secondary | ICD-10-CM | POA: Diagnosis not present

## 2023-06-04 DIAGNOSIS — E11621 Type 2 diabetes mellitus with foot ulcer: Secondary | ICD-10-CM | POA: Diagnosis not present

## 2023-06-04 DIAGNOSIS — E1140 Type 2 diabetes mellitus with diabetic neuropathy, unspecified: Secondary | ICD-10-CM | POA: Diagnosis not present

## 2023-06-04 DIAGNOSIS — L89510 Pressure ulcer of right ankle, unstageable: Secondary | ICD-10-CM | POA: Diagnosis not present

## 2023-06-04 DIAGNOSIS — I70223 Atherosclerosis of native arteries of extremities with rest pain, bilateral legs: Secondary | ICD-10-CM | POA: Diagnosis not present

## 2023-06-04 DIAGNOSIS — E1151 Type 2 diabetes mellitus with diabetic peripheral angiopathy without gangrene: Secondary | ICD-10-CM | POA: Diagnosis not present

## 2023-06-04 DIAGNOSIS — Z4781 Encounter for orthopedic aftercare following surgical amputation: Secondary | ICD-10-CM | POA: Diagnosis not present

## 2023-06-05 DIAGNOSIS — Z992 Dependence on renal dialysis: Secondary | ICD-10-CM | POA: Diagnosis not present

## 2023-06-05 DIAGNOSIS — N186 End stage renal disease: Secondary | ICD-10-CM | POA: Diagnosis not present

## 2023-06-05 DIAGNOSIS — N2581 Secondary hyperparathyroidism of renal origin: Secondary | ICD-10-CM | POA: Diagnosis not present

## 2023-06-08 DIAGNOSIS — N2581 Secondary hyperparathyroidism of renal origin: Secondary | ICD-10-CM | POA: Diagnosis not present

## 2023-06-08 DIAGNOSIS — Z992 Dependence on renal dialysis: Secondary | ICD-10-CM | POA: Diagnosis not present

## 2023-06-08 DIAGNOSIS — N186 End stage renal disease: Secondary | ICD-10-CM | POA: Diagnosis not present

## 2023-06-09 ENCOUNTER — Telehealth: Payer: Self-pay

## 2023-06-09 DIAGNOSIS — E1151 Type 2 diabetes mellitus with diabetic peripheral angiopathy without gangrene: Secondary | ICD-10-CM | POA: Diagnosis not present

## 2023-06-09 DIAGNOSIS — Z4781 Encounter for orthopedic aftercare following surgical amputation: Secondary | ICD-10-CM | POA: Diagnosis not present

## 2023-06-09 DIAGNOSIS — L97519 Non-pressure chronic ulcer of other part of right foot with unspecified severity: Secondary | ICD-10-CM | POA: Diagnosis not present

## 2023-06-09 DIAGNOSIS — L89510 Pressure ulcer of right ankle, unstageable: Secondary | ICD-10-CM | POA: Diagnosis not present

## 2023-06-09 DIAGNOSIS — E11621 Type 2 diabetes mellitus with foot ulcer: Secondary | ICD-10-CM | POA: Diagnosis not present

## 2023-06-09 DIAGNOSIS — L89610 Pressure ulcer of right heel, unstageable: Secondary | ICD-10-CM | POA: Diagnosis not present

## 2023-06-09 DIAGNOSIS — I70223 Atherosclerosis of native arteries of extremities with rest pain, bilateral legs: Secondary | ICD-10-CM | POA: Diagnosis not present

## 2023-06-09 DIAGNOSIS — Z89512 Acquired absence of left leg below knee: Secondary | ICD-10-CM | POA: Diagnosis not present

## 2023-06-09 DIAGNOSIS — E1140 Type 2 diabetes mellitus with diabetic neuropathy, unspecified: Secondary | ICD-10-CM | POA: Diagnosis not present

## 2023-06-09 NOTE — Telephone Encounter (Signed)
TRIAGE   Date/Time of Call: 06/09/23 1445  Caller Name/Relation to patient:  April @ [facility]  Reason for call:  Needs wound check for worsening foot wounds    Resolution: Booked appt for 06/10/23 @ 1015.  No answer at pt phone #.  Notified April who stated they are expecting her to call back after speaking to [facility] - she will tell them to be here in the am.

## 2023-06-10 ENCOUNTER — Ambulatory Visit (INDEPENDENT_AMBULATORY_CARE_PROVIDER_SITE_OTHER): Payer: Medicare HMO | Admitting: Physician Assistant

## 2023-06-10 VITALS — BP 134/65 | HR 75 | Temp 98.1°F | Resp 18 | Ht 70.0 in | Wt 250.0 lb

## 2023-06-10 DIAGNOSIS — Z89512 Acquired absence of left leg below knee: Secondary | ICD-10-CM

## 2023-06-10 DIAGNOSIS — I70435 Atherosclerosis of autologous vein bypass graft(s) of the right leg with ulceration of other part of foot: Secondary | ICD-10-CM

## 2023-06-10 NOTE — Progress Notes (Signed)
POST OPERATIVE OFFICE NOTE    CC:  F/u for surgery  HPI:  This is a 67 y.o. male who is s/pright common femoral to below knee popliteal artery bypass on 03/19/23 by Dr. Lenell Antu. This was for CLI with wounds. The wounds have been managed by [facility] and the patients wife. 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  The BKA was performed by Dr. Lenell Antu and revised by DR. Dickson on 02/10/23.               He and his wife are doing dressing changes.  He is taking Keflex as well since his last visit.  He does not have much sensation in his right foot and denies pain.  He denies fever and chills.  The [facility] RN ask that he be seen secondary to worsening appearence of the right foot wound from the previous [facility] visit.      Allergies  Allergen Reactions   Tape     Plastic tape -needs paper tape    Current Outpatient Medications  Medication Sig Dispense Refill   albuterol (VENTOLIN HFA) 108 (90 Base) MCG/ACT inhaler Inhale 2 puffs into the lungs every 6 (six) hours as needed for wheezing or shortness of breath.     amiodarone (PACERONE) 200 MG tablet TAKE 1 TABLET TWICE DAILY 180 tablet 3   apixaban (ELIQUIS) 5 MG TABS tablet Take 1 tablet (5 mg total) by mouth 2 (two) times daily.     atorvastatin (LIPITOR) 80 MG tablet Take 1 tablet (80 mg total) by mouth daily.     B Complex-C-Folic Acid (DIALYVITE 800) 0.8 MG TABS Take 0.8 mg by mouth daily.     cephALEXin (KEFLEX) 500 MG capsule Take 1 capsule (500 mg total) by mouth 2 (two) times daily. 14 capsule 0   cephALEXin (KEFLEX) 500 MG capsule Take 1 capsule (500 mg total) by mouth 3 (three) times daily. 42 capsule 1   clopidogrel (PLAVIX) 75 MG tablet Take 1 tablet (75 mg total) by mouth daily.     collagenase (SANTYL) 250 UNIT/GM ointment Apply 1 Application topically daily. 90 each 1   Dulaglutide (TRULICITY) 3 MG/0.5ML SOPN Inject 3 mg into the skin every Wednesday.     fluticasone furoate-vilanterol (BREO ELLIPTA) 200-25 MCG/ACT AEPB Inhale 1 puff  into the lungs daily.  3   insulin glargine (LANTUS) 100 UNIT/ML injection Inject 0.2 mLs (20 Units total) into the skin 2 (two) times daily.     Insulin Pen Needle 32G X 4 MM MISC Use to inject Levemir 2 (two) times daily. 100 each 1   Lactobacillus (ACIDOPHILUS) CAPS capsule Take 2 capsules by mouth daily. 100 capsule 0   Methoxy PEG-Epoetin Beta (MIRCERA IJ) Mircera     midodrine (PROAMATINE) 5 MG tablet Take 1 tablet (5 mg total) by mouth daily. 90 tablet 3   oxyCODONE-acetaminophen (PERCOCET/ROXICET) 5-325 MG tablet Take 1 tablet by mouth every 6 (six) hours as needed for severe pain. 20 tablet 0   promethazine (PHENERGAN) 12.5 MG tablet Take 12.5 mg by mouth 3 (three) times daily as needed.     torsemide (DEMADEX) 20 MG tablet Take 1 tablet (20 mg total) by mouth 2 (two) times daily. 360 tablet 1   Current Facility-Administered Medications  Medication Dose Route Frequency Provider Last Rate Last Admin   0.9 %  sodium chloride infusion  250 mL Intravenous PRN Leonie Claudia, MD       sodium chloride flush (NS)  0.9 % injection 3 mL  3 mL Intravenous Q12H Leonie Nussen, MD         ROS:  See HPI  Physical Exam:       Continued ischemic skin changes on the right foot Left BKA wound is smaller and appears to be healing  AT doppler signal intact right LE   Assessment/Plan:  This is a 67 y.o. male who is s/pright common femoral to below knee popliteal artery bypass on 03/19/23 by Dr. Lenell Antu. This was for CLI with wounds. The wounds have been managed by [facility] and the patients wife. 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  The BKA was performed by Dr. Lenell Antu and revised by DR. Dickson on 02/10/23.     Continue current wound care. He has a high likelihood of right LE limb loss even with an open bypass. He stated that "if he losses the right leg too he will just give up!" We will refer him to plastic surgery for a second opinion as last attempt to assist with right foot healing.     He really just wants a second opinion from plastic surgery about the right foot dorsum.  Pending scheduling for Plastics.  F/U with [place] in January.       Mosetta Pigeon PA-C [facility] [phone]   Clinic MD:  Randie Heinz

## 2023-06-11 DIAGNOSIS — E1140 Type 2 diabetes mellitus with diabetic neuropathy, unspecified: Secondary | ICD-10-CM | POA: Diagnosis not present

## 2023-06-11 DIAGNOSIS — L89610 Pressure ulcer of right heel, unstageable: Secondary | ICD-10-CM | POA: Diagnosis not present

## 2023-06-11 DIAGNOSIS — Z4781 Encounter for orthopedic aftercare following surgical amputation: Secondary | ICD-10-CM | POA: Diagnosis not present

## 2023-06-11 DIAGNOSIS — L97519 Non-pressure chronic ulcer of other part of right foot with unspecified severity: Secondary | ICD-10-CM | POA: Diagnosis not present

## 2023-06-11 DIAGNOSIS — L89510 Pressure ulcer of right ankle, unstageable: Secondary | ICD-10-CM | POA: Diagnosis not present

## 2023-06-11 DIAGNOSIS — E1151 Type 2 diabetes mellitus with diabetic peripheral angiopathy without gangrene: Secondary | ICD-10-CM | POA: Diagnosis not present

## 2023-06-11 DIAGNOSIS — I70223 Atherosclerosis of native arteries of extremities with rest pain, bilateral legs: Secondary | ICD-10-CM | POA: Diagnosis not present

## 2023-06-11 DIAGNOSIS — E11621 Type 2 diabetes mellitus with foot ulcer: Secondary | ICD-10-CM | POA: Diagnosis not present

## 2023-06-11 DIAGNOSIS — Z89512 Acquired absence of left leg below knee: Secondary | ICD-10-CM | POA: Diagnosis not present

## 2023-06-12 DIAGNOSIS — N2581 Secondary hyperparathyroidism of renal origin: Secondary | ICD-10-CM | POA: Diagnosis not present

## 2023-06-12 DIAGNOSIS — Z992 Dependence on renal dialysis: Secondary | ICD-10-CM | POA: Diagnosis not present

## 2023-06-12 DIAGNOSIS — N186 End stage renal disease: Secondary | ICD-10-CM | POA: Diagnosis not present

## 2023-06-13 ENCOUNTER — Other Ambulatory Visit: Payer: Self-pay | Admitting: Cardiology

## 2023-06-15 DIAGNOSIS — N2581 Secondary hyperparathyroidism of renal origin: Secondary | ICD-10-CM | POA: Diagnosis not present

## 2023-06-15 DIAGNOSIS — N186 End stage renal disease: Secondary | ICD-10-CM | POA: Diagnosis not present

## 2023-06-15 DIAGNOSIS — Z992 Dependence on renal dialysis: Secondary | ICD-10-CM | POA: Diagnosis not present

## 2023-06-15 NOTE — Telephone Encounter (Signed)
Prescription sent to pharmacy.

## 2023-06-16 DIAGNOSIS — Z4781 Encounter for orthopedic aftercare following surgical amputation: Secondary | ICD-10-CM | POA: Diagnosis not present

## 2023-06-16 DIAGNOSIS — L89510 Pressure ulcer of right ankle, unstageable: Secondary | ICD-10-CM | POA: Diagnosis not present

## 2023-06-16 DIAGNOSIS — Z89512 Acquired absence of left leg below knee: Secondary | ICD-10-CM | POA: Diagnosis not present

## 2023-06-16 DIAGNOSIS — E11621 Type 2 diabetes mellitus with foot ulcer: Secondary | ICD-10-CM | POA: Diagnosis not present

## 2023-06-16 DIAGNOSIS — E1151 Type 2 diabetes mellitus with diabetic peripheral angiopathy without gangrene: Secondary | ICD-10-CM | POA: Diagnosis not present

## 2023-06-16 DIAGNOSIS — I70223 Atherosclerosis of native arteries of extremities with rest pain, bilateral legs: Secondary | ICD-10-CM | POA: Diagnosis not present

## 2023-06-16 DIAGNOSIS — L89610 Pressure ulcer of right heel, unstageable: Secondary | ICD-10-CM | POA: Diagnosis not present

## 2023-06-16 DIAGNOSIS — L97519 Non-pressure chronic ulcer of other part of right foot with unspecified severity: Secondary | ICD-10-CM | POA: Diagnosis not present

## 2023-06-16 DIAGNOSIS — E1140 Type 2 diabetes mellitus with diabetic neuropathy, unspecified: Secondary | ICD-10-CM | POA: Diagnosis not present

## 2023-06-18 DIAGNOSIS — E1151 Type 2 diabetes mellitus with diabetic peripheral angiopathy without gangrene: Secondary | ICD-10-CM | POA: Diagnosis not present

## 2023-06-18 DIAGNOSIS — L97519 Non-pressure chronic ulcer of other part of right foot with unspecified severity: Secondary | ICD-10-CM | POA: Diagnosis not present

## 2023-06-18 DIAGNOSIS — E11621 Type 2 diabetes mellitus with foot ulcer: Secondary | ICD-10-CM | POA: Diagnosis not present

## 2023-06-18 DIAGNOSIS — E1140 Type 2 diabetes mellitus with diabetic neuropathy, unspecified: Secondary | ICD-10-CM | POA: Diagnosis not present

## 2023-06-18 DIAGNOSIS — L89610 Pressure ulcer of right heel, unstageable: Secondary | ICD-10-CM | POA: Diagnosis not present

## 2023-06-18 DIAGNOSIS — Z4781 Encounter for orthopedic aftercare following surgical amputation: Secondary | ICD-10-CM | POA: Diagnosis not present

## 2023-06-18 DIAGNOSIS — Z89512 Acquired absence of left leg below knee: Secondary | ICD-10-CM | POA: Diagnosis not present

## 2023-06-18 DIAGNOSIS — L89510 Pressure ulcer of right ankle, unstageable: Secondary | ICD-10-CM | POA: Diagnosis not present

## 2023-06-18 DIAGNOSIS — I70223 Atherosclerosis of native arteries of extremities with rest pain, bilateral legs: Secondary | ICD-10-CM | POA: Diagnosis not present

## 2023-06-19 ENCOUNTER — Encounter: Payer: Self-pay | Admitting: Cardiology

## 2023-06-19 DIAGNOSIS — N186 End stage renal disease: Secondary | ICD-10-CM | POA: Diagnosis not present

## 2023-06-19 DIAGNOSIS — Z992 Dependence on renal dialysis: Secondary | ICD-10-CM | POA: Diagnosis not present

## 2023-06-19 DIAGNOSIS — N2581 Secondary hyperparathyroidism of renal origin: Secondary | ICD-10-CM | POA: Diagnosis not present

## 2023-06-21 NOTE — Progress Notes (Signed)
 Cardiology Office Note:    Date:  06/21/2023   ID:  [name] [name], DOB [birth year], MRN [id number]  PCP:  Montey Lot, PA-C  Cardiologist:  Redell Leiter, MD    Referring MD: Montey Lot, PA-C    ASSESSMENT:    1. Atypical atrial flutter (HCC)   2. On amiodarone  therapy   3. Chronic anticoagulation   4. Hypertensive heart disease with heart failure (HCC)   5. Hypotension, unspecified hypotension type   6. ESRD (end stage renal disease) on dialysis (HCC)   7. Coronary artery disease involving native coronary artery of native heart without angina pectoris   8. PAD (peripheral artery disease) (HCC)   9. 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  Concerns regarding medicines are outlined above.  No orders of the defined types were placed in this encounter.  No orders of the defined types were placed in this encounter.    History of Present Illness:    [name] [name] is a 67 y.o. male with a hx of atypical atrial flutter on amiodarone  therapy with chronic anticoagulation end-stage renal disease with maintenance hemodialysis hypertensive heart disease with heart failure hypotension requiring midodrine  support coronary artery disease status post CABG and peripheral arterial disease last seen 03/10/2023.  At  his last visit with interruption of anticoagulant therapy during hospitalization we deferred consideration of cardioversion.  Compliance with diet, lifestyle and medications: Yes  He continues amiodarone  today he is on a ectopic atrial rhythm rate greater than 60 bpm He feels better and is not having palpitation shortness of breath edema chest pain palpitation or syncope He tolerates his anticoagulant without bleeding and his lipid-lowering therapy high intensity statin without muscle pain or weakness He continues maintenance hemodialysis his labs are checked there thyroid studies at his next labs Lipid profile 3 months ago LDL 71 cholesterol 104 non-HDL cholesterol 58 Past Medical History:  Diagnosis Date   Acute kidney failure, unspecified (HCC) 12/05/2021   Anemia 04/17/2022   Blood transfusion   Atherosclerosis of native arteries of extremities with rest pain, right leg (HCC) 03/23/2023   Atherosclerotic heart disease    Benign essential HTN 07/30/2014   Bradycardia 01/25/2015   Cardiomyopathy, unspecified (HCC) 12/03/2021   CHF (congestive heart failure) (HCC)    Chronic anticoagulation 02/25/2018   Chronic diastolic CHF (congestive heart failure) (HCC) 07/15/2021   Chronic systolic CHF (congestive heart failure), NYHA class 3 (HCC) 08/30/2014   Overview:  Global ef 30%   CKD (chronic kidney disease)    Class 3 severe obesity in adult [facility]) 04/28/2017   Coronary artery disease    Critical limb ischemia of right lower extremity (HCC) 03/19/2023   Diabetes mellitus without complication (HCC)    Diabetic neuropathy (HCC) 07/28/2014   Dysrhythmia    Erectile dysfunction    GERD (gastroesophageal reflux disease)    High risk medication  use 05/26/2018   Hypertensive heart disease with heart failure (HCC) 07/30/2014   Leukocytosis 11/26/2021   Limb ischemia 01/26/2023   Lumbago    LV dysfunction 04/28/2017   Mixed hyperlipidemia 07/28/2014   OSA (obstructive sleep apnea)     CPAP   PAD (peripheral artery disease) (HCC) 01/26/2023   Paroxysmal atrial fibrillation (HCC) 07/28/2014   Peripheral arterial disease (HCC)    Postop check 01/06/2022   S/P CABG x 3 11/12/2021   Sinus node dysfunction (HCC) 04/21/2016   Testicular hypofunction    Uncontrolled type 2 diabetes mellitus with microalbuminuric diabetic nephropathy 07/28/2014    Current Medications: Current Meds  Medication Sig   Accu-Chek Softclix Lancets lancets 1 each by Other route daily.   Blood Glucose Monitoring Suppl (ACCU-CHEK GUIDE ME) w/Device KIT 1 Device by Other route 3 (three) times daily.   glucose blood test strip 1 each by Other route 3 (three) times daily.      EKGs/Labs/Other Studies Reviewed:    The following studies were reviewed today:  Cardiac Studies & Procedures   CARDIAC CATHETERIZATION  CARDIAC CATHETERIZATION 10/24/2021  Narrative   Ost LM to Mid LM lesion is 50% stenosed.   Dist LM lesion is 75% stenosed.   Ost LAD to Prox LAD lesion is 50% stenosed.  Ectatic segment with eccentric calcium .   Ost Cx to Prox Cx lesion is 70% stenosed.   Mid LAD lesion is 50% stenosed.   Prox RCA to Mid RCA lesion is 70% stenosed.   LV end diastolic pressure is normal.   There is no aortic valve stenosis.  Eccentric, severely calcified left main disease extending into the ostial LAD.  There is also ostial disease and a relatively small circumflex vessel.  Diffuse, calcific disease in the mid RCA today.  Patient was initially scheduled for A-fib ablation.  Plan now for cardiac surgery consult with consideration of CABG, maze and left atrial appendage clip.  Findings Coronary Findings Diagnostic  Dominance: Right  Left Main Ost LM to Mid LM lesion is 50% stenosed. The lesion is eccentric. Dist LM lesion is 75% stenosed. The lesion is eccentric.  Left Anterior Descending Ost LAD to Prox LAD lesion is 50% stenosed. The lesion is severely calcified. Mid LAD lesion is 50%  stenosed.  Left Circumflex Ost Cx to Prox Cx lesion is 70% stenosed.  Right Coronary Artery There is mild diffuse disease throughout the vessel. Prox RCA to Mid RCA lesion is 70% stenosed. The lesion is eccentric. The lesion is severely calcified.  Intervention  No interventions have been documented.    ECHOCARDIOGRAM  ECHOCARDIOGRAM COMPLETE 11/13/2021  Narrative ECHOCARDIOGRAM REPORT    Patient Name:   Franco Germain Date of Exam: 11/13/2021 Medical Rec #:  [id number]    Height:       70.0 in Accession #:    [id number]   Weight:       295.6 lb Date of Birth:  [birth year]   BSA:          2.464 m Patient Age:    44 years     BP:           126/62 mmHg Patient Gender: M            HR:           87 bpm. Exam Location:  Inpatient  Procedure: 2D Echo, Cardiac Doppler, Color Doppler and Intracardiac Opacification Agent  Indications:    CHF  History:  Patient has prior history of Echocardiogram examinations, most recent 07/30/2021. CHF, Arrythmias:Bradycardia; Risk Factors:Hypertension, Dyslipidemia and Diabetes. S/p CABG x 3.  Sonographer:    EMMIE DEW RDCS Referring Phys: 64 LINDSAY NICOLE [facility]   Sonographer Comments: Patient is morbidly obese and Technically difficult study due to poor echo windows. Image acquisition challenging due to patient body habitus. IMPRESSIONS   1. Left ventricular ejection fraction, by estimation, is 55 to 60%. The left ventricle has normal function. Left ventricular endocardial border not optimally defined to evaluate regional wall motion. Left ventricular diastolic parameters are consistent with Grade II diastolic dysfunction (pseudonormalization). 2. Peak RV-RA gradient 28 mmHg. Right ventricular systolic function was not well visualized. The right ventricular size is not well visualized. 3. The mitral valve was not well visualized. No evidence of mitral valve regurgitation. No evidence of mitral stenosis. 4. The aortic valve is  tricuspid. There is mild calcification of the aortic valve. Aortic valve regurgitation is not visualized. Aortic valve sclerosis is present, with no evidence of aortic valve stenosis. 5. No significant pericardial effusion. 6. The heart is poorly visualized. Grossly, LV systolic function appears to be in the normal range.  FINDINGS Left Ventricle: Left ventricular ejection fraction, by estimation, is 55 to 60%. The left ventricle has normal function. Left ventricular endocardial border not optimally defined to evaluate regional wall motion. The left ventricular internal cavity size was normal in size. There is no left ventricular hypertrophy. Left ventricular diastolic parameters are consistent with Grade II diastolic dysfunction (pseudonormalization).  Right Ventricle: Peak RV-RA gradient 28 mmHg. The right ventricular size is not well visualized. Right vetricular wall thickness was not well visualized. Right ventricular systolic function was not well visualized.  Left Atrium: Left atrial size was not well visualized.  Right Atrium: Right atrial size was not well visualized.  Pericardium: There is no evidence of pericardial effusion.  Mitral Valve: The mitral valve was not well visualized. No evidence of mitral valve regurgitation. No evidence of mitral valve stenosis. MV peak gradient, 9.1 mmHg. The mean mitral valve gradient is 3.0 mmHg.  Tricuspid Valve: The tricuspid valve is normal in structure. Tricuspid valve regurgitation is trivial.  Aortic Valve: The aortic valve is tricuspid. There is mild calcification of the aortic valve. Aortic valve regurgitation is not visualized. Aortic valve sclerosis is present, with no evidence of aortic valve stenosis. Aortic valve mean gradient measures 2.5 mmHg. Aortic valve peak gradient measures 4.8 mmHg. Aortic valve area, by VTI measures 6.12 cm.  Pulmonic Valve: The pulmonic valve was normal in structure. Pulmonic valve regurgitation is not  visualized.  Aorta: The aortic root is normal in size and structure.  Venous: The inferior vena cava was not well visualized.  IAS/Shunts: The interatrial septum was not well visualized.   LEFT VENTRICLE PLAX 2D LVIDd:         5.10 cm   Diastology LVIDs:         4.00 cm   LV e' medial:    5.92 cm/s LV PW:         1.20 cm   LV E/e' medial:  27.2 LV IVS:        1.10 cm   LV e' lateral:   7.22 cm/s LVOT diam:     2.60 cm   LV E/e' lateral: 22.3 LV SV:         102 LV SV Index:   42 LVOT Area:     5.31 cm   RIGHT VENTRICLE RV S prime:  10.10 cm/s  LEFT ATRIUM         Index LA diam:    2.70 cm 1.10 cm/m AORTIC VALVE                    PULMONIC VALVE AV Area (Vmax):    5.36 cm     PV Vmax:       1.02 m/s AV Area (Vmean):   5.17 cm     PV Vmean:      67.500 cm/s AV Area (VTI):     6.12 cm     PV VTI:        0.144 m AV Vmax:           109.00 cm/s  PV Peak grad:  4.2 mmHg AV Vmean:          74.650 cm/s  PV Mean grad:  2.0 mmHg AV VTI:            0.168 m AV Peak Grad:      4.8 mmHg AV Mean Grad:      2.5 mmHg LVOT Vmax:         110.00 cm/s LVOT Vmean:        72.700 cm/s LVOT VTI:          0.193 m LVOT/AV VTI ratio: 1.15  AORTA Ao Root diam: 3.70 cm Ao Asc diam:  3.60 cm  MITRAL VALVE                TRICUSPID VALVE MV Area (PHT): 3.91 cm     TR Peak grad:   28.5 mmHg MV Area VTI:   2.44 cm     TR Vmax:        267.00 cm/s MV Peak grad:  9.1 mmHg MV Mean grad:  3.0 mmHg     SHUNTS MV Vmax:       1.51 m/s     Systemic VTI:  0.19 m MV Vmean:      80.3 cm/s    Systemic Diam: 2.60 cm MV Decel Time: 194 msec MV E velocity: 161.00 cm/s MV A velocity: 52.60 cm/s MV E/A ratio:  3.06  Dalton McleanMD Electronically signed by Ezra Kanner Signature Date/Time: 11/13/2021/3:42:40 PM    Final  TEE  ECHO TEE 11/21/2021  Narrative TRANSESOPHOGEAL ECHO REPORT    Patient Name:   [name] [name] Date of Exam: 11/21/2021 Medical Rec #:  [id number]    Height:       70.0  in Accession #:    [id number]   Weight:       257.6 lb Date of Birth:  October 11, 1955   BSA:          2.324 m Patient Age:    82 years     BP:           97/60 mmHg Patient Gender: M            HR:           83 bpm. Exam Location:  Inpatient  Procedure: Transesophageal Echo, Color Doppler and Saline Contrast Bubble Study  Indications:     I48.91* Unspeicified atrial fibrillation  History:         Patient has prior history of Echocardiogram examinations, most recent 11/13/2021. CHF, CAD, Arrythmias:Bradycardia and Atrial Fibrillation; Risk Factors:Dyslipidemia, Hypertension and Diabetes.  Sonographer:     Lauraine Pilot RDCS Referring Phys:  [id number] [facility] NICOLE [facility] Diagnosing Phys: Ezra Kanner  PROCEDURE: After discussion of the risks and  benefits of a TEE, an informed consent was obtained from the patient. The transesophogeal probe was passed without difficulty through the esophogus of the patient. Sedation performed by different physician. The patient was monitored while under deep sedation. Anesthestetic sedation was provided intravenously by Anesthesiology: 56.28mg  of Propofol . The patient's vital signs; including heart rate, blood pressure, and oxygen saturation; remained stable throughout the procedure. The patient developed no complications during the procedure. A successful direct current cardioversion was performed at 200 joules with 1 attempt.  IMPRESSIONS   1. Left ventricular ejection fraction, by estimation, is 55 to 60%. The left ventricle has normal function. The left ventricle has no regional wall motion abnormalities but there is septal bounce suggestive of prior cardiac surgery. 2. Right ventricular systolic function is mildly reduced. The right ventricular size is normal. Tricuspid regurgitation signal is inadequate for assessing PA pressure. 3. Left atrial size was mildly dilated. No left atrial/left atrial appendage thrombus was detected. 4. There is a PFO by color  doppler and bubble study. 5. The mitral valve is normal in structure. Mild mitral valve regurgitation. No evidence of mitral stenosis. 6. The aortic valve is tricuspid. There is mild calcification of the aortic valve. Aortic valve regurgitation is not visualized. No aortic stenosis is present. 7. A small pericardial effusion is present, primarily towards the apex.  FINDINGS Left Ventricle: Left ventricular ejection fraction, by estimation, is 55 to 60%. The left ventricle has normal function. The left ventricle has no regional wall motion abnormalities. The left ventricular internal cavity size was normal in size. There is no left ventricular hypertrophy.  Right Ventricle: The right ventricular size is normal. No increase in right ventricular wall thickness. Right ventricular systolic function is mildly reduced. Tricuspid regurgitation signal is inadequate for assessing PA pressure.  Left Atrium: Left atrial size was mildly dilated. No left atrial/left atrial appendage thrombus was detected.  Right Atrium: Right atrial size was normal in size.  Pericardium: A small pericardial effusion is present.  Mitral Valve: The mitral valve is normal in structure. Mild mitral valve regurgitation. No evidence of mitral valve stenosis.  Tricuspid Valve: The tricuspid valve is normal in structure. Tricuspid valve regurgitation is trivial.  Aortic Valve: The aortic valve is tricuspid. There is mild calcification of the aortic valve. Aortic valve regurgitation is not visualized. No aortic stenosis is present.  Pulmonic Valve: The pulmonic valve was normal in structure. Pulmonic valve regurgitation is not visualized.  Aorta: The aortic root is normal in size and structure.  IAS/Shunts: There is a PFO by color doppler and bubble study. Agitated saline contrast was given intravenously to evaluate for intracardiac shunting.  D[employer]ton [employer] Electronically signed by Ezra Kanner Signature Date/Time:  11/21/2021/3:32:50 PM    Final  MONITORS  LONG TERM MONITOR-LIVE TELEMETRY (3-14 DAYS) 02/17/2022  Narrative Patch Wear Time:  12 days and 10 hours (2023-07-31T11:42:52-0400 to 2023-08-12T22:32:43-0400)  Atrial Flutter occurred continuously (100% burden), ranging from 62-138 bpm (avg of 96 bpm).  There were 6 triggered events all atrial flutter with a rapid ventricular rate.  Isolated VEs were rare (<1.0%), VE Couplets were rare (<1.0%), and no VE Triplets were present.  Previously notified: MD notification criteria for First Documentation of Atrial Flutter met - notified Lauraine BECKER RN on 20 Jan 2022 at 2:16 PM ET (RP).           EKG Interpretation Date/Time:  Tuesday June 23 2023 13:01:29 EST Ventricular Rate:  69 PR Interval:  160 QRS Duration:  146 QT  Interval:  586 QTC Calculation: 627 R Axis:   -57  Text Interpretation: Ectopic atrial rhythm Left axis deviation Right bundle branch block When compared with ECG of 10-Mar-2023 13:36, Sinus rhythm has replaced Atrial fibrillation QT has lengthened Confirmed by Monetta Rogue ([id number]) on 06/23/2023 1:05:14 PM   Recent Labs: 02/05/2023: B Natriuretic Peptide 553.7; Magnesium  2.1; TSH 1.384 03/19/2023: ALT 25 03/20/2023: BUN 40; Creatinine, Ser 5.07; Hemoglobin 7.9; Platelets 331; Potassium 6.2; Sodium 134  Recent Lipid Panel    Component Value Date/Time   CHOL 104 03/20/2023 0630   TRIG 71 03/20/2023 0630   HDL 46 03/20/2023 0630   CHOLHDL 2.3 03/20/2023 0630   VLDL 14 03/20/2023 0630   LDLCALC 44 03/20/2023 0630    Physical Exam:    VS:  There were no vitals taken for this visit.    Wt Readings from Last 3 Encounters:  06/10/23 250 lb (113.4 kg)  06/02/23 250 lb (113.4 kg)  05/05/23 230 lb (104.3 kg)     GEN:  Well nourished, well developed in no acute distress HEENT: Normal NECK: No JVD; No carotid bruits LYMPHATICS: No lymphadenopathy CARDIAC: RRR, no murmurs, rubs, gallops RESPIRATORY:  Clear to  auscultation without rales, wheezing or rhonchi  ABDOMEN: Soft, non-tender, non-distended MUSCULOSKELETAL:  No edema; No deformity  SKIN: Warm and dry NEUROLOGIC:  Alert and oriented x 3 PSYCHIATRIC:  Normal affect    Signed, Rogue Monetta, MD  06/21/2023 12:26 PM    [facility]

## 2023-06-22 DIAGNOSIS — N2581 Secondary hyperparathyroidism of renal origin: Secondary | ICD-10-CM | POA: Diagnosis not present

## 2023-06-22 DIAGNOSIS — Z992 Dependence on renal dialysis: Secondary | ICD-10-CM | POA: Diagnosis not present

## 2023-06-22 DIAGNOSIS — N186 End stage renal disease: Secondary | ICD-10-CM | POA: Diagnosis not present

## 2023-06-23 ENCOUNTER — Ambulatory Visit: Payer: Medicare HMO | Attending: Cardiology | Admitting: Cardiology

## 2023-06-23 ENCOUNTER — Encounter: Payer: Self-pay | Admitting: Cardiology

## 2023-06-23 VITALS — BP 118/46 | HR 69

## 2023-06-23 DIAGNOSIS — I110 Hypertensive heart disease with heart failure: Secondary | ICD-10-CM | POA: Diagnosis not present

## 2023-06-23 DIAGNOSIS — Z79899 Other long term (current) drug therapy: Secondary | ICD-10-CM | POA: Diagnosis not present

## 2023-06-23 DIAGNOSIS — E782 Mixed hyperlipidemia: Secondary | ICD-10-CM

## 2023-06-23 DIAGNOSIS — L89610 Pressure ulcer of right heel, unstageable: Secondary | ICD-10-CM | POA: Diagnosis not present

## 2023-06-23 DIAGNOSIS — E1151 Type 2 diabetes mellitus with diabetic peripheral angiopathy without gangrene: Secondary | ICD-10-CM | POA: Diagnosis not present

## 2023-06-23 DIAGNOSIS — I959 Hypotension, unspecified: Secondary | ICD-10-CM | POA: Diagnosis not present

## 2023-06-23 DIAGNOSIS — E11621 Type 2 diabetes mellitus with foot ulcer: Secondary | ICD-10-CM | POA: Diagnosis not present

## 2023-06-23 DIAGNOSIS — I484 Atypical atrial flutter: Secondary | ICD-10-CM

## 2023-06-23 DIAGNOSIS — Z7901 Long term (current) use of anticoagulants: Secondary | ICD-10-CM | POA: Diagnosis not present

## 2023-06-23 DIAGNOSIS — Z992 Dependence on renal dialysis: Secondary | ICD-10-CM

## 2023-06-23 DIAGNOSIS — I2510 Atherosclerotic heart disease of native coronary artery without angina pectoris: Secondary | ICD-10-CM | POA: Diagnosis not present

## 2023-06-23 DIAGNOSIS — I739 Peripheral vascular disease, unspecified: Secondary | ICD-10-CM

## 2023-06-23 DIAGNOSIS — L97519 Non-pressure chronic ulcer of other part of right foot with unspecified severity: Secondary | ICD-10-CM | POA: Diagnosis not present

## 2023-06-23 DIAGNOSIS — N186 End stage renal disease: Secondary | ICD-10-CM | POA: Diagnosis not present

## 2023-06-23 DIAGNOSIS — E1140 Type 2 diabetes mellitus with diabetic neuropathy, unspecified: Secondary | ICD-10-CM | POA: Diagnosis not present

## 2023-06-23 DIAGNOSIS — I70223 Atherosclerosis of native arteries of extremities with rest pain, bilateral legs: Secondary | ICD-10-CM | POA: Diagnosis not present

## 2023-06-23 DIAGNOSIS — Z4781 Encounter for orthopedic aftercare following surgical amputation: Secondary | ICD-10-CM | POA: Diagnosis not present

## 2023-06-23 DIAGNOSIS — L89510 Pressure ulcer of right ankle, unstageable: Secondary | ICD-10-CM | POA: Diagnosis not present

## 2023-06-23 DIAGNOSIS — N179 Acute kidney failure, unspecified: Secondary | ICD-10-CM | POA: Diagnosis not present

## 2023-06-23 DIAGNOSIS — Z89512 Acquired absence of left leg below knee: Secondary | ICD-10-CM | POA: Diagnosis not present

## 2023-06-23 NOTE — Patient Instructions (Signed)
 Medication Instructions:  Your physician recommends that you continue on your current medications as directed. Please refer to the Current Medication list given to you today.  *If you need a refill on your cardiac medications before your next appointment, please call your pharmacy*   Lab Work: Please have a TSH, T3 and T4 done at your next dialysis appointment.  If you have labs (blood work) drawn today and your tests are completely normal, you will receive your results only by: MyChart Message (if you have MyChart) OR A paper copy in the mail If you have any lab test that is abnormal or we need to change your treatment, we will call you to review the results.   Testing/Procedures: None Ordered   Follow-Up: At [facility], you and your health needs are our priority.  As part of our continuing mission to provide you with exceptional heart care, we have created designated Provider Care Teams.  These Care Teams include your primary Cardiologist (physician) and Advanced Practice Providers (APPs -  Physician Assistants and Nurse Practitioners) who all work together to provide you with the care you need, when you need it.  We recommend signing up for the patient portal called MyChart.  Sign up information is provided on this After Visit Summary.  MyChart is used to connect with patients for Virtual Visits (Telemedicine).  Patients are able to view lab/test results, encounter notes, upcoming appointments, etc.  Non-urgent messages can be sent to your provider as well.   To learn more about what you can do with MyChart, go to [email or web address].    Your next appointment:   6 month follow up   The format for your next appointment:   In Person  Provider:      Dr. Monetta recommends you get a [facility].

## 2023-06-25 ENCOUNTER — Other Ambulatory Visit: Payer: Self-pay | Admitting: Cardiology

## 2023-06-25 ENCOUNTER — Ambulatory Visit: Payer: Medicare HMO

## 2023-06-25 DIAGNOSIS — I70223 Atherosclerosis of native arteries of extremities with rest pain, bilateral legs: Secondary | ICD-10-CM | POA: Diagnosis not present

## 2023-06-25 DIAGNOSIS — L89510 Pressure ulcer of right ankle, unstageable: Secondary | ICD-10-CM | POA: Diagnosis not present

## 2023-06-25 DIAGNOSIS — E1140 Type 2 diabetes mellitus with diabetic neuropathy, unspecified: Secondary | ICD-10-CM | POA: Diagnosis not present

## 2023-06-25 DIAGNOSIS — E11621 Type 2 diabetes mellitus with foot ulcer: Secondary | ICD-10-CM | POA: Diagnosis not present

## 2023-06-25 DIAGNOSIS — E1151 Type 2 diabetes mellitus with diabetic peripheral angiopathy without gangrene: Secondary | ICD-10-CM | POA: Diagnosis not present

## 2023-06-25 DIAGNOSIS — Z4781 Encounter for orthopedic aftercare following surgical amputation: Secondary | ICD-10-CM | POA: Diagnosis not present

## 2023-06-25 DIAGNOSIS — Z89512 Acquired absence of left leg below knee: Secondary | ICD-10-CM | POA: Diagnosis not present

## 2023-06-25 DIAGNOSIS — L97519 Non-pressure chronic ulcer of other part of right foot with unspecified severity: Secondary | ICD-10-CM | POA: Diagnosis not present

## 2023-06-25 DIAGNOSIS — L89610 Pressure ulcer of right heel, unstageable: Secondary | ICD-10-CM | POA: Diagnosis not present

## 2023-06-26 DIAGNOSIS — N2581 Secondary hyperparathyroidism of renal origin: Secondary | ICD-10-CM | POA: Diagnosis not present

## 2023-06-26 DIAGNOSIS — N186 End stage renal disease: Secondary | ICD-10-CM | POA: Diagnosis not present

## 2023-06-26 DIAGNOSIS — Z992 Dependence on renal dialysis: Secondary | ICD-10-CM | POA: Diagnosis not present

## 2023-06-29 DIAGNOSIS — N2581 Secondary hyperparathyroidism of renal origin: Secondary | ICD-10-CM | POA: Diagnosis not present

## 2023-06-29 DIAGNOSIS — N186 End stage renal disease: Secondary | ICD-10-CM | POA: Diagnosis not present

## 2023-06-29 DIAGNOSIS — Z992 Dependence on renal dialysis: Secondary | ICD-10-CM | POA: Diagnosis not present

## 2023-06-30 ENCOUNTER — Ambulatory Visit: Payer: Medicare HMO

## 2023-06-30 DIAGNOSIS — E1140 Type 2 diabetes mellitus with diabetic neuropathy, unspecified: Secondary | ICD-10-CM | POA: Diagnosis not present

## 2023-06-30 DIAGNOSIS — I70223 Atherosclerosis of native arteries of extremities with rest pain, bilateral legs: Secondary | ICD-10-CM | POA: Diagnosis not present

## 2023-06-30 DIAGNOSIS — Z4781 Encounter for orthopedic aftercare following surgical amputation: Secondary | ICD-10-CM | POA: Diagnosis not present

## 2023-06-30 DIAGNOSIS — E11621 Type 2 diabetes mellitus with foot ulcer: Secondary | ICD-10-CM | POA: Diagnosis not present

## 2023-06-30 DIAGNOSIS — L89610 Pressure ulcer of right heel, unstageable: Secondary | ICD-10-CM | POA: Diagnosis not present

## 2023-06-30 DIAGNOSIS — L97519 Non-pressure chronic ulcer of other part of right foot with unspecified severity: Secondary | ICD-10-CM | POA: Diagnosis not present

## 2023-06-30 DIAGNOSIS — E1151 Type 2 diabetes mellitus with diabetic peripheral angiopathy without gangrene: Secondary | ICD-10-CM | POA: Diagnosis not present

## 2023-06-30 DIAGNOSIS — L89510 Pressure ulcer of right ankle, unstageable: Secondary | ICD-10-CM | POA: Diagnosis not present

## 2023-06-30 DIAGNOSIS — Z89512 Acquired absence of left leg below knee: Secondary | ICD-10-CM | POA: Diagnosis not present

## 2023-07-02 DIAGNOSIS — Z89512 Acquired absence of left leg below knee: Secondary | ICD-10-CM | POA: Diagnosis not present

## 2023-07-02 DIAGNOSIS — L89510 Pressure ulcer of right ankle, unstageable: Secondary | ICD-10-CM | POA: Diagnosis not present

## 2023-07-02 DIAGNOSIS — E11621 Type 2 diabetes mellitus with foot ulcer: Secondary | ICD-10-CM | POA: Diagnosis not present

## 2023-07-02 DIAGNOSIS — Z4781 Encounter for orthopedic aftercare following surgical amputation: Secondary | ICD-10-CM | POA: Diagnosis not present

## 2023-07-02 DIAGNOSIS — L97519 Non-pressure chronic ulcer of other part of right foot with unspecified severity: Secondary | ICD-10-CM | POA: Diagnosis not present

## 2023-07-02 DIAGNOSIS — I70223 Atherosclerosis of native arteries of extremities with rest pain, bilateral legs: Secondary | ICD-10-CM | POA: Diagnosis not present

## 2023-07-02 DIAGNOSIS — E1151 Type 2 diabetes mellitus with diabetic peripheral angiopathy without gangrene: Secondary | ICD-10-CM | POA: Diagnosis not present

## 2023-07-02 DIAGNOSIS — L89610 Pressure ulcer of right heel, unstageable: Secondary | ICD-10-CM | POA: Diagnosis not present

## 2023-07-02 DIAGNOSIS — E1140 Type 2 diabetes mellitus with diabetic neuropathy, unspecified: Secondary | ICD-10-CM | POA: Diagnosis not present

## 2023-07-03 DIAGNOSIS — Z992 Dependence on renal dialysis: Secondary | ICD-10-CM | POA: Diagnosis not present

## 2023-07-03 DIAGNOSIS — N2581 Secondary hyperparathyroidism of renal origin: Secondary | ICD-10-CM | POA: Diagnosis not present

## 2023-07-03 DIAGNOSIS — N186 End stage renal disease: Secondary | ICD-10-CM | POA: Diagnosis not present

## 2023-07-07 DIAGNOSIS — L89610 Pressure ulcer of right heel, unstageable: Secondary | ICD-10-CM | POA: Diagnosis not present

## 2023-07-07 DIAGNOSIS — L97519 Non-pressure chronic ulcer of other part of right foot with unspecified severity: Secondary | ICD-10-CM | POA: Diagnosis not present

## 2023-07-07 DIAGNOSIS — L89510 Pressure ulcer of right ankle, unstageable: Secondary | ICD-10-CM | POA: Diagnosis not present

## 2023-07-07 DIAGNOSIS — I70223 Atherosclerosis of native arteries of extremities with rest pain, bilateral legs: Secondary | ICD-10-CM | POA: Diagnosis not present

## 2023-07-07 DIAGNOSIS — E1151 Type 2 diabetes mellitus with diabetic peripheral angiopathy without gangrene: Secondary | ICD-10-CM | POA: Diagnosis not present

## 2023-07-07 DIAGNOSIS — E1140 Type 2 diabetes mellitus with diabetic neuropathy, unspecified: Secondary | ICD-10-CM | POA: Diagnosis not present

## 2023-07-07 DIAGNOSIS — Z4781 Encounter for orthopedic aftercare following surgical amputation: Secondary | ICD-10-CM | POA: Diagnosis not present

## 2023-07-07 DIAGNOSIS — Z89512 Acquired absence of left leg below knee: Secondary | ICD-10-CM | POA: Diagnosis not present

## 2023-07-07 DIAGNOSIS — E11621 Type 2 diabetes mellitus with foot ulcer: Secondary | ICD-10-CM | POA: Diagnosis not present

## 2023-07-09 ENCOUNTER — Ambulatory Visit: Payer: Medicare HMO | Admitting: Physician Assistant

## 2023-07-09 VITALS — BP 175/77 | HR 72 | Temp 98.5°F | Resp 22 | Ht 70.0 in | Wt 250.0 lb

## 2023-07-09 DIAGNOSIS — I70239 Atherosclerosis of native arteries of right leg with ulceration of unspecified site: Secondary | ICD-10-CM

## 2023-07-09 NOTE — Progress Notes (Signed)
HISTORY AND PHYSICAL     CC:  follow up. Requesting Provider:  [name], PA-C  HPI: This is a 68 y.o. male who is here today for follow up for PAD.  Pt has hx of  left BKA on 01/26/2023 by Dr. Lenell Antu and revision of left BKA and evacuation of hematoma with placement of biologic dressing and vac placement 02/10/2023 by Dr. Edilia Bo.  On 03/19/2023 he underwent right CFA to BK popliteal artery bypass with 6mm PTFE in the subfascial tunnel by Dr. Lenell Antu for RLE ulceration.   He had hx of aortogram with laser atherectomy and stenting left SFA and popliteal arteries on 12/29/2022 by Dr. Randie Heinz for chronic LLE threatening ischemia with toe ulceration.    He underwent left great toe amputation by Dr. Marylene Land and subsequently underwent unsuccessful attempt at retrograde recanalization of the ATA for chronic total occlusion on 01/22/2023 by Dr. Chestine Spore.   Pt was last seen 06/10/2023 and at that time, he and his wife were doing dressing changes.  He had tendon exposed on the dorsum of the right foot.    He has hx of ESRD  The pt returns today for follow up.  He states he feels like the wound on his foot has gotten a little better.  He states the left BKA wound has also improved.  He does not have any feeling in his foot.    The pt is on a statin for cholesterol management.    The pt is not on an aspirin.    Other AC:  Eliquis The pt is on diuretic  The pt is is on medication for diabetes. Tobacco hx:  former    Past Medical History:  Diagnosis Date   Acute kidney failure, unspecified (HCC) 12/05/2021   Anemia 04/17/2022   Blood transfusion   Atherosclerosis of native arteries of extremities with rest pain, right leg (HCC) 03/23/2023   Atherosclerotic heart disease    Benign essential HTN 07/30/2014   Bradycardia 01/25/2015   Cardiomyopathy, unspecified (HCC) 12/03/2021   CHF (congestive heart failure) (HCC)    Chronic anticoagulation 02/25/2018   Chronic diastolic CHF (congestive heart  failure) (HCC) 07/15/2021   Chronic systolic CHF (congestive heart failure), NYHA class 3 (HCC) 08/30/2014   Overview:  Global ef 30%   CKD (chronic kidney disease)    Class 3 severe obesity in adult [facility]) 04/28/2017   Coronary artery disease    Critical limb ischemia of right lower extremity (HCC) 03/19/2023   Diabetes mellitus without complication (HCC)    Diabetic neuropathy (HCC) 07/28/2014   Dysrhythmia    Erectile dysfunction    GERD (gastroesophageal reflux disease)    High risk medication use 05/26/2018   Hypertensive heart disease with heart failure (HCC) 07/30/2014   Leukocytosis 11/26/2021   Limb ischemia 01/26/2023   Lumbago    LV dysfunction 04/28/2017   Mixed hyperlipidemia 07/28/2014   OSA (obstructive sleep apnea)    CPAP   PAD (peripheral artery disease) (HCC) 01/26/2023   Paroxysmal atrial fibrillation (HCC) 07/28/2014   Peripheral arterial disease (HCC)    Postop check 01/06/2022   S/P CABG x 3 11/12/2021   Sinus node dysfunction (HCC) 04/21/2016   Testicular hypofunction    Uncontrolled type 2 diabetes mellitus with microalbuminuric diabetic nephropathy 07/28/2014    Past Surgical History:  Procedure Laterality Date   ABDOMINAL AORTOGRAM W/LOWER EXTREMITY N/A 12/29/2022   Procedure: ABDOMINAL AORTOGRAM W/LOWER EXTREMITY;  Surgeon: Maeola Harman, MD;  Location: [facility] INVASIVE CV LAB;  Service: Cardiovascular;  Laterality: N/A;   ABDOMINAL AORTOGRAM W/LOWER EXTREMITY N/A 01/22/2023   Procedure: ABDOMINAL AORTOGRAM W/LOWER EXTREMITY;  Surgeon: [name], MD;  Location: [facility] INVASIVE CV LAB;  Service: Cardiovascular;  Laterality: N/A;   ABDOMINAL AORTOGRAM W/LOWER EXTREMITY N/A 03/06/2023   Procedure: ABDOMINAL AORTOGRAM W/LOWER EXTREMITY;  Surgeon: Leonie Jaiyden, MD;  Location: [facility] INVASIVE CV LAB;  Service: Cardiovascular;  Laterality: N/A;   AMPUTATION Left 01/26/2023   Procedure: LEFT AMPUTATION BELOW KNEE;  Surgeon: Leonie Edmar, MD;   Location: [facility] OR;  Service: Vascular;  Laterality: Left;  with regional block   APPLICATION OF WOUND VAC Left 01/26/2023   Procedure: APPLICATION OF WOUND VAC, LEFT BELOW THE KNEE AMPUTATION STUMP;  Surgeon: Leonie Porfirio, MD;  Location: [facility] OR;  Service: Vascular;  Laterality: Left;  With regional block   APPLICATION OF WOUND VAC Left 02/10/2023   Procedure: APPLICATION OF WOUND VAC;  Surgeon: Chuck Hint, MD;  Location: [facility] OR;  Service: Vascular;  Laterality: Left;   AV FISTULA PLACEMENT Right 05/20/2022   Procedure: RIGHT ARM ARTERIOVENOUS (AV) FISTULA CREATION;  Surgeon: Larina Earthly, MD;  Location: [facility] OR;  Service: Vascular;  Laterality: Right;   BUBBLE STUDY  11/21/2021   Procedure: BUBBLE STUDY;  Surgeon: Laurey Morale, MD;  Location: [facility] ENDOSCOPY;  Service: Cardiovascular;;   CARDIAC CATHETERIZATION     CARDIOVERSION N/A 11/21/2021   Procedure: CARDIOVERSION;  Surgeon: Laurey Morale, MD;  Location: [facility] ENDOSCOPY;  Service: Cardiovascular;  Laterality: N/A;   CARDIOVERSION N/A 11/25/2021   Procedure: CARDIOVERSION;  Surgeon: Laurey Morale, MD;  Location: [facility] ENDOSCOPY;  Service: Cardiovascular;  Laterality: N/A;   CARDIOVERSION N/A 11/29/2021   Procedure: CARDIOVERSION;  Surgeon: Laurey Morale, MD;  Location: [facility] ENDOSCOPY;  Service: Cardiovascular;  Laterality: N/A;   CARDIOVERSION N/A 02/27/2022   Procedure: CARDIOVERSION;  Surgeon: Little Ishikawa, MD;  Location: [facility] ENDOSCOPY;  Service: Cardiovascular;  Laterality: N/A;   CORONARY ARTERY BYPASS GRAFT N/A 11/12/2021   Procedure: CORONARY ARTERY BYPASS GRAFTING (CABG) x  3 USING LEFT INTERNAL MAMMARY ARTERY AND RIGHT GREATER SAPHENOUS VEIN;  Surgeon: Lovett Sox, MD;  Location: [facility] OR;  Service: Open Heart Surgery;  Laterality: N/A;   ELECTROPHYSIOLOGIC STUDY N/A 01/18/2015   Procedure: CARDIOVERSION;  Surgeon: Lamar Blinks, MD;  Location: [facility] ORS;  Service: Cardiovascular;  Laterality: N/A;   ENDOVEIN  HARVEST OF GREATER SAPHENOUS VEIN  11/12/2021   Procedure: ENDOVEIN HARVEST OF GREATER SAPHENOUS VEIN;  Surgeon: Lovett Sox, MD;  Location: [facility] OR;  Service: Open Heart Surgery;;   EYE SURGERY Bilateral 2022   FEMORAL-POPLITEAL BYPASS GRAFT Right 03/19/2023   Procedure: RIGHT COMMON FEMORAL-BELOW KNEE POPLITEAL ARTERY BYPASS;  Surgeon: Leonie Daevion, MD;  Location: [facility] OR;  Service: Vascular;  Laterality: Right;   FISTULA SUPERFICIALIZATION Right 07/10/2022   Procedure: RIGHT ARM ARTERIOVENOUS FISTULA SUPERFICIALIZATION;  Surgeon: Maeola Harman, MD;  Location: [facility] OR;  Service: Vascular;  Laterality: Right;   IR FLUORO GUIDE CV LINE RIGHT  11/28/2021   IR US GUIDE VASC ACCESS RIGHT  11/28/2021   LEFT HEART CATH AND CORONARY ANGIOGRAPHY N/A 10/24/2021   Procedure: LEFT HEART CATH AND CORONARY ANGIOGRAPHY;  Surgeon: Corky Crafts, MD;  Location: [facility] INVASIVE CV LAB;  Service: Cardiovascular;  Laterality: N/A;   MAZE N/A 11/12/2021   Procedure: MAZE;  Surgeon: Lovett Sox, MD;  Location: [facility] OR;  Service: Open Heart Surgery;  Laterality: N/A;   PERIPHERAL VASCULAR  ATHERECTOMY  12/29/2022   Procedure: PERIPHERAL VASCULAR ATHERECTOMY;  Surgeon: Maeola Harman, MD;  Location: [facility] INVASIVE CV LAB;  Service: Cardiovascular;;   PERIPHERAL VASCULAR INTERVENTION  12/29/2022   Procedure: PERIPHERAL VASCULAR INTERVENTION;  Surgeon: Maeola Harman, MD;  Location: [facility] INVASIVE CV LAB;  Service: Cardiovascular;;  Left SFA, Bilateral illiac   PERIPHERAL VASCULAR INTERVENTION Right 03/06/2023   Procedure: PERIPHERAL VASCULAR INTERVENTION;  Surgeon: Leonie Ash, MD;  Location: [facility] INVASIVE CV LAB;  Service: Cardiovascular;  Laterality: Right;  SFA   STUMP REVISION Left 02/10/2023   Procedure: LEFT BELOW KNEE AMPUTATION REVISION;  Surgeon: Chuck Hint, MD;  Location: [facility] OR;  Service: Vascular;  Laterality: Left;   TEE WITHOUT CARDIOVERSION N/A 11/12/2021    Procedure: TRANSESOPHAGEAL ECHOCARDIOGRAM (TEE);  Surgeon: Lovett Sox, MD;  Location: [facility] OR;  Service: Open Heart Surgery;  Laterality: N/A;   TEE WITHOUT CARDIOVERSION N/A 11/21/2021   Procedure: TRANSESOPHAGEAL ECHOCARDIOGRAM (TEE);  Surgeon: Laurey Morale, MD;  Location: [facility] ENDOSCOPY;  Service: Cardiovascular;  Laterality: N/A;    Allergies  Allergen Reactions   Tape     Plastic tape -needs paper tape    Current Outpatient Medications  Medication Sig Dispense Refill   Accu-Chek Softclix Lancets lancets 1 each by Other route daily.     albuterol (VENTOLIN HFA) 108 (90 Base) MCG/ACT inhaler Inhale 2 puffs into the lungs every 6 (six) hours as needed for wheezing or shortness of breath.     amiodarone (PACERONE) 200 MG tablet TAKE 1 TABLET TWICE DAILY 180 tablet 3   apixaban (ELIQUIS) 5 MG TABS tablet Take 1 tablet (5 mg total) by mouth 2 (two) times daily.     atorvastatin (LIPITOR) 80 MG tablet Take 1 tablet (80 mg total) by mouth daily. 90 tablet 2   B Complex-C-Folic Acid (DIALYVITE 800) 0.8 MG TABS Take 0.8 mg by mouth daily.     Blood Glucose Monitoring Suppl (ACCU-CHEK GUIDE ME) w/Device KIT 1 Device by Other route 3 (three) times daily.     collagenase (SANTYL) 250 UNIT/GM ointment Apply 1 Application topically daily. 90 each 1   Dulaglutide (TRULICITY) 3 MG/0.5ML SOPN Inject 3 mg into the skin every Wednesday.     fluticasone furoate-vilanterol (BREO ELLIPTA) 200-25 MCG/ACT AEPB Inhale 1 puff into the lungs daily.  3   glucose blood test strip 1 each by Other route 3 (three) times daily.     insulin glargine (LANTUS) 100 UNIT/ML injection Inject 0.2 mLs (20 Units total) into the skin 2 (two) times daily.     Insulin Pen Needle 32G X 4 MM MISC Use to inject Levemir 2 (two) times daily. 100 each 1   Methoxy PEG-Epoetin Beta (MIRCERA IJ) Mircera     midodrine (PROAMATINE) 5 MG tablet TAKE 1 TABLET EVERY DAY 90 tablet 2   torsemide (DEMADEX) 20 MG tablet Take 1 tablet (20 mg  total) by mouth 2 (two) times daily. 360 tablet 1   No current facility-administered medications for this visit.    Family History  Problem Relation Age of Onset   CAD Brother    Drug abuse Brother    Lung cancer Father    CAD Mother     Social History   Socioeconomic History   Marital status: Married    Spouse name: Not on file   Number of children: Not on file   Years of education: Not on file   Highest education level: Not on file  Occupational History  Not on file  Tobacco Use   Smoking status: Former    Current packs/day: 0.00    Types: Cigarettes    Quit date: 2007    Years since quitting: 18.0   Smokeless tobacco: Never  Vaping Use   Vaping status: Never Used  Substance and Sexual Activity   Alcohol use: No   Drug use: Not Currently    Frequency: 1.0 times per week    Types: Marijuana   Sexual activity: Not on file  Other Topics Concern   Not on file  Social History Narrative   Not on file   Social Drivers of Health   Financial Resource Strain: Not on file  Food Insecurity: No Food Insecurity (02/05/2023)   Hunger Vital Sign    Worried About Running Out of Food in the Last Year: Never true    Ran Out of Food in the Last Year: Never true  Transportation Needs: No Transportation Needs (02/05/2023)   PRAPARE - Administrator, Civil Service (Medical): No    Lack of Transportation (Non-Medical): No  Physical Activity: Not on file  Stress: Not on file  Social Connections: Not on file  Intimate Partner Violence: Not At Risk (02/05/2023)   Humiliation, Afraid, Rape, and Kick questionnaire    Fear of Current or Ex-Partner: No    Emotionally Abused: No    Physically Abused: No    Sexually Abused: No     REVIEW OF SYSTEMS:  x [X]  denotes positive finding, [ ]  denotes negative finding Cardiac  Comments:  Chest pain or chest pressure:    Shortness of breath upon exertion:    Short of breath when lying flat:    Irregular heart rhythm:         Vascular    Pain in calf, thigh, or hip brought on by ambulation:    Pain in feet at night that wakes you up from your sleep:     Blood clot in your veins:    Leg swelling:         Pulmonary    Oxygen at home:    Productive cough:     Wheezing:         Neurologic    Sudden weakness in arms or legs:     Sudden numbness in arms or legs:     Sudden onset of difficulty speaking or slurred speech:    Temporary loss of vision in one eye:     Problems with dizziness:         Gastrointestinal    Blood in stool:     Vomited blood:         Genitourinary    Burning when urinating:     Blood in urine:        Psychiatric    Major depression:         Hematologic    Bleeding problems:    Problems with blood clotting too easily:        Skin    Rashes or ulcers: x       Constitutional    Fever or chills:      PHYSICAL EXAMINATION:  Today's Vitals   07/09/23 1345  BP: (!) 175/77  Pulse: 72  Resp: (!) 22  Temp: 98.5 F (36.9 C)  TempSrc: Temporal  SpO2: 91%  Weight: 250 lb (113.4 kg)  Height: 5\' 10"  (1.778 m)   Body mass index is 35.87 kg/m.   General:  WDWN in NAD; vital  signs documented above Gait: Not observed HENT: WNL, normocephalic Pulmonary: normal non-labored breathing , without wheezing Cardiac: regular HR Abdomen: obese Skin: without rashes Vascular Exam/Pulses: Brisk doppler flow right peroneal > AT/PT Extremities:  Right foot wound as pictured      Left BKA wound as pictured  Musculoskeletal: no muscle wasting or atrophy  Neurologic: A&O X 3 Psychiatric:  The pt has Normal affect.   Non-Invasive Vascular Imaging:   Previous ABI's/TBI's on 05/27/2023: Right:  /absent - Great toe pressure: absent Left:  BKA   Previous arterial duplex on 05/27/2023: Right Graft #1: fem-pop  +------------------+--------+---------------+----------+----------------+                   PSV cm/sStenosis       Waveform  Comments           +------------------+--------+---------------+----------+----------------+  Inflow           113                    biphasic                    +------------------+--------+---------------+----------+----------------+  Prox Anastomosis  181     50-70% stenosismonophasiclow end of range  +------------------+--------+---------------+----------+----------------+  Proximal Graft    187     50-70% stenosismonophasiclow end of range  +------------------+--------+---------------+----------+----------------+  Mid Graft         145                    monophasic                  +------------------+--------+---------------+----------+----------------+  Distal Graft      153                    monophasic                  +------------------+--------+---------------+----------+----------------+  Distal Anastomosis182     50-70% stenosis          low end of range  +------------------+--------+---------------+----------+----------------+  Outflow          200     50-74% stenosis          low end of range  +------------------+--------+---------------+----------+----------------+   Summary:  Right: Bypass graft is patent with velocities at the low end of the 50-70% range involving the proximal anastomosis, proximal graft, distal graft and outflow.  Velocities have increased since the last exam on 04/14/2023.     ASSESSMENT/PLAN:: 68 y.o. male here for follow up for PAD with hx of left BKA on 01/26/2023 by Dr. Lenell Antu and revision of left BKA and evacuation of hematoma with placement of biologic dressing and vac placement 02/10/2023 by Dr. Edilia Bo.  On 03/19/2023 he underwent right CFA to BK popliteal artery bypass with 6mm PTFE in the subfascial tunnel by Dr. Lenell Antu for RLE ulceration.   -pt with brisk right peroneal >AT/PT doppler flow -foot wound on dorsum of right foot with fibrinous exudate and eschar.  I did sharply debride some of the fibrinous exudate and eschar  from the medial aspect of this wound.  I also sharply debrided some dead skin from around the lateral wound.   -the wound on the BKA stump is much improved as pictured.  -pt had mild stenosis in his bypass in December.   -continue lipitor.  He is on Eliquis.  -pt will f/u in 4 weeks with Dr. Lenell Antu for further evaluation and discussion about any interventions. -he  would like referral to plastic surgery for the wound on the dorsum of his foot.  Referral placed today.  -he would like to get back in with Dr. Marylene Land for routine foot care and toe nail trim.  I have put in referral for this as well.     Doreatha Massed, [facility] [phone]  Clinic MD:   Karin Lieu

## 2023-07-10 DIAGNOSIS — E11621 Type 2 diabetes mellitus with foot ulcer: Secondary | ICD-10-CM | POA: Diagnosis not present

## 2023-07-10 DIAGNOSIS — Z89512 Acquired absence of left leg below knee: Secondary | ICD-10-CM | POA: Diagnosis not present

## 2023-07-10 DIAGNOSIS — I70223 Atherosclerosis of native arteries of extremities with rest pain, bilateral legs: Secondary | ICD-10-CM | POA: Diagnosis not present

## 2023-07-10 DIAGNOSIS — N2581 Secondary hyperparathyroidism of renal origin: Secondary | ICD-10-CM | POA: Diagnosis not present

## 2023-07-10 DIAGNOSIS — L97519 Non-pressure chronic ulcer of other part of right foot with unspecified severity: Secondary | ICD-10-CM | POA: Diagnosis not present

## 2023-07-10 DIAGNOSIS — L89510 Pressure ulcer of right ankle, unstageable: Secondary | ICD-10-CM | POA: Diagnosis not present

## 2023-07-10 DIAGNOSIS — Z992 Dependence on renal dialysis: Secondary | ICD-10-CM | POA: Diagnosis not present

## 2023-07-10 DIAGNOSIS — E1140 Type 2 diabetes mellitus with diabetic neuropathy, unspecified: Secondary | ICD-10-CM | POA: Diagnosis not present

## 2023-07-10 DIAGNOSIS — L89610 Pressure ulcer of right heel, unstageable: Secondary | ICD-10-CM | POA: Diagnosis not present

## 2023-07-10 DIAGNOSIS — Z4781 Encounter for orthopedic aftercare following surgical amputation: Secondary | ICD-10-CM | POA: Diagnosis not present

## 2023-07-10 DIAGNOSIS — E1151 Type 2 diabetes mellitus with diabetic peripheral angiopathy without gangrene: Secondary | ICD-10-CM | POA: Diagnosis not present

## 2023-07-10 DIAGNOSIS — N186 End stage renal disease: Secondary | ICD-10-CM | POA: Diagnosis not present

## 2023-07-13 DIAGNOSIS — N2581 Secondary hyperparathyroidism of renal origin: Secondary | ICD-10-CM | POA: Diagnosis not present

## 2023-07-13 DIAGNOSIS — N186 End stage renal disease: Secondary | ICD-10-CM | POA: Diagnosis not present

## 2023-07-13 DIAGNOSIS — Z992 Dependence on renal dialysis: Secondary | ICD-10-CM | POA: Diagnosis not present

## 2023-07-14 DIAGNOSIS — R0600 Dyspnea, unspecified: Secondary | ICD-10-CM | POA: Diagnosis not present

## 2023-07-14 DIAGNOSIS — G4733 Obstructive sleep apnea (adult) (pediatric): Secondary | ICD-10-CM | POA: Diagnosis not present

## 2023-07-14 DIAGNOSIS — J4540 Moderate persistent asthma, uncomplicated: Secondary | ICD-10-CM | POA: Diagnosis not present

## 2023-07-15 ENCOUNTER — Encounter: Payer: Self-pay | Admitting: Plastic Surgery

## 2023-07-15 ENCOUNTER — Ambulatory Visit: Payer: Medicare HMO | Admitting: Plastic Surgery

## 2023-07-15 VITALS — BP 134/50 | HR 67

## 2023-07-15 DIAGNOSIS — I70223 Atherosclerosis of native arteries of extremities with rest pain, bilateral legs: Secondary | ICD-10-CM | POA: Diagnosis not present

## 2023-07-15 DIAGNOSIS — S81801A Unspecified open wound, right lower leg, initial encounter: Secondary | ICD-10-CM

## 2023-07-15 DIAGNOSIS — S91301A Unspecified open wound, right foot, initial encounter: Secondary | ICD-10-CM

## 2023-07-15 DIAGNOSIS — L97519 Non-pressure chronic ulcer of other part of right foot with unspecified severity: Secondary | ICD-10-CM | POA: Diagnosis not present

## 2023-07-15 DIAGNOSIS — Z4781 Encounter for orthopedic aftercare following surgical amputation: Secondary | ICD-10-CM | POA: Diagnosis not present

## 2023-07-15 DIAGNOSIS — E11621 Type 2 diabetes mellitus with foot ulcer: Secondary | ICD-10-CM | POA: Diagnosis not present

## 2023-07-15 DIAGNOSIS — E1151 Type 2 diabetes mellitus with diabetic peripheral angiopathy without gangrene: Secondary | ICD-10-CM | POA: Diagnosis not present

## 2023-07-15 DIAGNOSIS — L89510 Pressure ulcer of right ankle, unstageable: Secondary | ICD-10-CM | POA: Diagnosis not present

## 2023-07-15 DIAGNOSIS — Z89512 Acquired absence of left leg below knee: Secondary | ICD-10-CM | POA: Diagnosis not present

## 2023-07-15 DIAGNOSIS — L89610 Pressure ulcer of right heel, unstageable: Secondary | ICD-10-CM | POA: Diagnosis not present

## 2023-07-15 DIAGNOSIS — E1140 Type 2 diabetes mellitus with diabetic neuropathy, unspecified: Secondary | ICD-10-CM | POA: Diagnosis not present

## 2023-07-15 NOTE — Progress Notes (Signed)
Referring Provider [name] Peak, PA-C [address] [place],  [place] [address]   CC:  Chief Complaint  Patient presents with   Advice Only   Skin Problem      [name] [name] is an 68 y.o. male.  HPI: [name] [name] is a 68 year old male with a long and complicated history of vascular disease that is resulted in below the knee amputation on the left and need for revascularization on the right.  He currently has 3 wounds on the right foot which have failed to heal.  The primary wound on the right foot is on the dorsum of the foot.  [name] states that this seems to have improved slightly over time but he is continuing to do dressing changes.  Allergies  Allergen Reactions   Tape     Plastic tape -needs paper tape    Outpatient Encounter Medications as of 07/15/2023  Medication Sig   Accu-Chek Softclix Lancets lancets 1 each by Other route daily.   albuterol (VENTOLIN HFA) 108 (90 Base) MCG/ACT inhaler Inhale 2 puffs into the lungs every 6 (six) hours as needed for wheezing or shortness of breath.   amiodarone (PACERONE) 200 MG tablet TAKE 1 TABLET TWICE DAILY   apixaban (ELIQUIS) 5 MG TABS tablet Take 1 tablet (5 mg total) by mouth 2 (two) times daily.   atorvastatin (LIPITOR) 80 MG tablet Take 1 tablet (80 mg total) by mouth daily.   B Complex-C-Folic Acid (DIALYVITE 800) 0.8 MG TABS Take 0.8 mg by mouth daily.   Blood Glucose Monitoring Suppl (ACCU-CHEK GUIDE ME) w/Device KIT 1 Device by Other route 3 (three) times daily.   collagenase (SANTYL) 250 UNIT/GM ointment Apply 1 Application topically daily.   Dulaglutide (TRULICITY) 3 MG/0.5ML SOPN Inject 3 mg into the skin every Wednesday.   fluticasone furoate-vilanterol (BREO ELLIPTA) 200-25 MCG/ACT AEPB Inhale 1 puff into the lungs daily.   glucose blood test strip 1 each by Other route 3 (three) times daily.   insulin glargine (LANTUS) 100 UNIT/ML injection Inject 0.2 mLs (20 Units total) into the skin 2 (two) times daily.    Insulin Pen Needle 32G X 4 MM MISC Use to inject Levemir 2 (two) times daily.   midodrine (PROAMATINE) 5 MG tablet TAKE 1 TABLET EVERY DAY   [DISCONTINUED] Methoxy PEG-Epoetin Beta (MIRCERA IJ) Mircera   torsemide (DEMADEX) 20 MG tablet Take 1 tablet (20 mg total) by mouth 2 (two) times daily.   No facility-administered encounter medications on file as of 07/15/2023.     Past Medical History:  Diagnosis Date   Acute kidney failure, unspecified (HCC) 12/05/2021   Anemia 04/17/2022   Blood transfusion   Atherosclerosis of native arteries of extremities with rest pain, right leg (HCC) 03/23/2023   Atherosclerotic heart disease    Benign essential HTN 07/30/2014   Bradycardia 01/25/2015   Cardiomyopathy, unspecified (HCC) 12/03/2021   CHF (congestive heart failure) (HCC)    Chronic anticoagulation 02/25/2018   Chronic diastolic CHF (congestive heart failure) (HCC) 07/15/2021   Chronic systolic CHF (congestive heart failure), NYHA class 3 (HCC) 08/30/2014   Overview:  Global ef 30%   CKD (chronic kidney disease)    Class 3 severe obesity in adult [facility]) 04/28/2017   Coronary artery disease    Critical limb ischemia of right lower extremity (HCC) 03/19/2023   Diabetes mellitus without complication (HCC)    Diabetic neuropathy (HCC) 07/28/2014   Dysrhythmia    Erectile dysfunction    GERD (gastroesophageal  reflux disease)    High risk medication use 05/26/2018   Hypertensive heart disease with heart failure (HCC) 07/30/2014   Leukocytosis 11/26/2021   Limb ischemia 01/26/2023   Lumbago    LV dysfunction 04/28/2017   Mixed hyperlipidemia 07/28/2014   OSA (obstructive sleep apnea)    CPAP   PAD (peripheral artery disease) (HCC) 01/26/2023   Paroxysmal atrial fibrillation (HCC) 07/28/2014   Peripheral arterial disease (HCC)    Postop check 01/06/2022   S/P CABG x 3 11/12/2021   Sinus node dysfunction (HCC) 04/21/2016   Testicular hypofunction    Uncontrolled type 2 diabetes  mellitus with microalbuminuric diabetic nephropathy 07/28/2014    Past Surgical History:  Procedure Laterality Date   ABDOMINAL AORTOGRAM W/LOWER EXTREMITY N/A 12/29/2022   Procedure: ABDOMINAL AORTOGRAM W/LOWER EXTREMITY;  Surgeon: Maeola Harman, MD;  Location: [facility] INVASIVE CV LAB;  Service: Cardiovascular;  Laterality: N/A;   ABDOMINAL AORTOGRAM W/LOWER EXTREMITY N/A 01/22/2023   Procedure: ABDOMINAL AORTOGRAM W/LOWER EXTREMITY;  Surgeon: [name], MD;  Location: [facility] INVASIVE CV LAB;  Service: Cardiovascular;  Laterality: N/A;   ABDOMINAL AORTOGRAM W/LOWER EXTREMITY N/A 03/06/2023   Procedure: ABDOMINAL AORTOGRAM W/LOWER EXTREMITY;  Surgeon: Leonie Mann, MD;  Location: [facility] INVASIVE CV LAB;  Service: Cardiovascular;  Laterality: N/A;   AMPUTATION Left 01/26/2023   Procedure: LEFT AMPUTATION BELOW KNEE;  Surgeon: Leonie Jakarius, MD;  Location: [facility] OR;  Service: Vascular;  Laterality: Left;  with regional block   APPLICATION OF WOUND VAC Left 01/26/2023   Procedure: APPLICATION OF WOUND VAC, LEFT BELOW THE KNEE AMPUTATION STUMP;  Surgeon: Leonie Autry, MD;  Location: [facility] OR;  Service: Vascular;  Laterality: Left;  With regional block   APPLICATION OF WOUND VAC Left 02/10/2023   Procedure: APPLICATION OF WOUND VAC;  Surgeon: Chuck Hint, MD;  Location: [facility] OR;  Service: Vascular;  Laterality: Left;   AV FISTULA PLACEMENT Right 05/20/2022   Procedure: RIGHT ARM ARTERIOVENOUS (AV) FISTULA CREATION;  Surgeon: Larina Earthly, MD;  Location: [facility] OR;  Service: Vascular;  Laterality: Right;   BUBBLE STUDY  11/21/2021   Procedure: BUBBLE STUDY;  Surgeon: Laurey Morale, MD;  Location: [facility] ENDOSCOPY;  Service: Cardiovascular;;   CARDIAC CATHETERIZATION     CARDIOVERSION N/A 11/21/2021   Procedure: CARDIOVERSION;  Surgeon: Laurey Morale, MD;  Location: [facility] ENDOSCOPY;  Service: Cardiovascular;  Laterality: N/A;   CARDIOVERSION N/A 11/25/2021   Procedure: CARDIOVERSION;   Surgeon: Laurey Morale, MD;  Location: [facility] ENDOSCOPY;  Service: Cardiovascular;  Laterality: N/A;   CARDIOVERSION N/A 11/29/2021   Procedure: CARDIOVERSION;  Surgeon: Laurey Morale, MD;  Location: [facility] ENDOSCOPY;  Service: Cardiovascular;  Laterality: N/A;   CARDIOVERSION N/A 02/27/2022   Procedure: CARDIOVERSION;  Surgeon: Little Ishikawa, MD;  Location: [facility] ENDOSCOPY;  Service: Cardiovascular;  Laterality: N/A;   CORONARY ARTERY BYPASS GRAFT N/A 11/12/2021   Procedure: CORONARY ARTERY BYPASS GRAFTING (CABG) x  3 USING LEFT INTERNAL MAMMARY ARTERY AND RIGHT GREATER SAPHENOUS VEIN;  Surgeon: Lovett Sox, MD;  Location: [facility] OR;  Service: Open Heart Surgery;  Laterality: N/A;   ELECTROPHYSIOLOGIC STUDY N/A 01/18/2015   Procedure: CARDIOVERSION;  Surgeon: Lamar Blinks, MD;  Location: [facility] ORS;  Service: Cardiovascular;  Laterality: N/A;   ENDOVEIN HARVEST OF GREATER SAPHENOUS VEIN  11/12/2021   Procedure: ENDOVEIN HARVEST OF GREATER SAPHENOUS VEIN;  Surgeon: Lovett Sox, MD;  Location: [facility] OR;  Service: Open Heart Surgery;;   EYE SURGERY Bilateral 2022  FEMORAL-POPLITEAL BYPASS GRAFT Right 03/19/2023   Procedure: RIGHT COMMON FEMORAL-BELOW KNEE POPLITEAL ARTERY BYPASS;  Surgeon: Leonie Hesston, MD;  Location: [facility] OR;  Service: Vascular;  Laterality: Right;   FISTULA SUPERFICIALIZATION Right 07/10/2022   Procedure: RIGHT ARM ARTERIOVENOUS FISTULA SUPERFICIALIZATION;  Surgeon: Maeola Harman, MD;  Location: [facility] OR;  Service: Vascular;  Laterality: Right;   IR FLUORO GUIDE CV LINE RIGHT  11/28/2021   IR US GUIDE VASC ACCESS RIGHT  11/28/2021   LEFT HEART CATH AND CORONARY ANGIOGRAPHY N/A 10/24/2021   Procedure: LEFT HEART CATH AND CORONARY ANGIOGRAPHY;  Surgeon: Corky Crafts, MD;  Location: [facility] INVASIVE CV LAB;  Service: Cardiovascular;  Laterality: N/A;   MAZE N/A 11/12/2021   Procedure: MAZE;  Surgeon: Lovett Sox, MD;  Location: [facility] OR;  Service: Open Heart  Surgery;  Laterality: N/A;   PERIPHERAL VASCULAR ATHERECTOMY  12/29/2022   Procedure: PERIPHERAL VASCULAR ATHERECTOMY;  Surgeon: Maeola Harman, MD;  Location: [facility] INVASIVE CV LAB;  Service: Cardiovascular;;   PERIPHERAL VASCULAR INTERVENTION  12/29/2022   Procedure: PERIPHERAL VASCULAR INTERVENTION;  Surgeon: Maeola Harman, MD;  Location: [facility] INVASIVE CV LAB;  Service: Cardiovascular;;  Left SFA, Bilateral illiac   PERIPHERAL VASCULAR INTERVENTION Right 03/06/2023   Procedure: PERIPHERAL VASCULAR INTERVENTION;  Surgeon: Leonie Ryken, MD;  Location: [facility] INVASIVE CV LAB;  Service: Cardiovascular;  Laterality: Right;  SFA   STUMP REVISION Left 02/10/2023   Procedure: LEFT BELOW KNEE AMPUTATION REVISION;  Surgeon: Chuck Hint, MD;  Location: [facility] OR;  Service: Vascular;  Laterality: Left;   TEE WITHOUT CARDIOVERSION N/A 11/12/2021   Procedure: TRANSESOPHAGEAL ECHOCARDIOGRAM (TEE);  Surgeon: Lovett Sox, MD;  Location: [facility] OR;  Service: Open Heart Surgery;  Laterality: N/A;   TEE WITHOUT CARDIOVERSION N/A 11/21/2021   Procedure: TRANSESOPHAGEAL ECHOCARDIOGRAM (TEE);  Surgeon: Laurey Morale, MD;  Location: [facility] ENDOSCOPY;  Service: Cardiovascular;  Laterality: N/A;    Family History  Problem Relation Age of Onset   CAD Brother    Drug abuse Brother    Lung cancer Father    CAD Mother     Social History   Social History Narrative   Not on file     Review of Systems General: Denies fevers, chills, weight loss CV: Denies chest pain, shortness of breath, palpitations Right lower extremity: Open wounds on the top side and heel.  He denies any significant pain in the foot.  States that he is able to use the foot to drive but ambulation is difficult  Physical Exam    07/15/2023    2:26 PM 07/09/2023    1:45 PM 06/23/2023    1:07 PM  Vitals with BMI  Height  5\' 10"    Weight  250 lbs   BMI  35.87   Systolic 134 175 119  Diastolic 50 77 46  Pulse 67 72 69     General:  No acute distress,  Alert and oriented, Non-Toxic, Normal speech and affect Right lower extremity: Patient has a superficial wound on the dorsum of the foot that measures approximately 8-1/2 x 6 cm.  There is exposed tendon there is some granulation tissue at the most proximal portion of the wound but the distal portion of the wound has  exudate and minimal granulation tissue.  There is capillary refill within the granulation tissue and in the toes but no palpable pulse.  He is able to flex and extend the great toe.   Assessment/Plan Nonhealing wound, right foot, dorsum:  Patient has exposed tendon and a very poorly healed wound on the right foot.  A skin graft is not a viable option for this foot.  I have told him that I will try light debridement of the foot and placement of myriad tissue replacement matrix.  If I can get a good granulation bed on the foot it might be possible to place a skin graft but I am not hopeful.  Photographs were obtained today with his consent.  Will schedule him for placement of tissue matrix and debridement.  He is strongly encouraged to keep his appointment with the wound care center for their opinion and assistance with wound care.  Santiago Glad 07/15/2023, 3:52 PM

## 2023-07-16 ENCOUNTER — Ambulatory Visit (INDEPENDENT_AMBULATORY_CARE_PROVIDER_SITE_OTHER): Payer: Medicare HMO | Admitting: Podiatry

## 2023-07-16 DIAGNOSIS — E11621 Type 2 diabetes mellitus with foot ulcer: Secondary | ICD-10-CM | POA: Diagnosis not present

## 2023-07-16 DIAGNOSIS — L97519 Non-pressure chronic ulcer of other part of right foot with unspecified severity: Secondary | ICD-10-CM | POA: Diagnosis not present

## 2023-07-16 DIAGNOSIS — M79674 Pain in right toe(s): Secondary | ICD-10-CM | POA: Diagnosis not present

## 2023-07-16 DIAGNOSIS — Z4781 Encounter for orthopedic aftercare following surgical amputation: Secondary | ICD-10-CM | POA: Diagnosis not present

## 2023-07-16 DIAGNOSIS — Z89512 Acquired absence of left leg below knee: Secondary | ICD-10-CM | POA: Diagnosis not present

## 2023-07-16 DIAGNOSIS — L89510 Pressure ulcer of right ankle, unstageable: Secondary | ICD-10-CM | POA: Diagnosis not present

## 2023-07-16 DIAGNOSIS — E1151 Type 2 diabetes mellitus with diabetic peripheral angiopathy without gangrene: Secondary | ICD-10-CM

## 2023-07-16 DIAGNOSIS — B351 Tinea unguium: Secondary | ICD-10-CM | POA: Diagnosis not present

## 2023-07-16 DIAGNOSIS — I70223 Atherosclerosis of native arteries of extremities with rest pain, bilateral legs: Secondary | ICD-10-CM | POA: Diagnosis not present

## 2023-07-16 DIAGNOSIS — E1140 Type 2 diabetes mellitus with diabetic neuropathy, unspecified: Secondary | ICD-10-CM | POA: Diagnosis not present

## 2023-07-16 DIAGNOSIS — L89610 Pressure ulcer of right heel, unstageable: Secondary | ICD-10-CM | POA: Diagnosis not present

## 2023-07-16 NOTE — Progress Notes (Signed)
Subjective:  Patient ID: [name] [name], male    DOB: 06/11/1956,  MRN: [id number]  [name] [name] presents to clinic today for:  Chief Complaint  Patient presents with   [facility]    [facility] of the Right foot, there is a wound that is being addressed by Plastics. He is only here for the nail care. Last A1c 7.2 , 2months ago when seen PCP. Takes plavix   Patient notes nails are thick and elongated, causing pain in shoe gear.  He is in a wheelchair today.  He is being treated for a wound on the right foot and has a dressing in place.    PCP is [name], PA-C.  Last seen around 05/23/23  Past Medical History:  Diagnosis Date   Acute kidney failure, unspecified (HCC) 12/05/2021   Anemia 04/17/2022   Blood transfusion   Atherosclerosis of native arteries of extremities with rest pain, right leg (HCC) 03/23/2023   Atherosclerotic heart disease    Benign essential HTN 07/30/2014   Bradycardia 01/25/2015   Cardiomyopathy, unspecified (HCC) 12/03/2021   CHF (congestive heart failure) (HCC)    Chronic anticoagulation 02/25/2018   Chronic diastolic CHF (congestive heart failure) (HCC) 07/15/2021   Chronic systolic CHF (congestive heart failure), NYHA class 3 (HCC) 08/30/2014   Overview:  Global ef 30%   CKD (chronic kidney disease)    Class 3 severe obesity in adult (HCC) 04/28/2017   Coronary artery disease    Critical limb ischemia of right lower extremity (HCC) 03/19/2023   Diabetes mellitus without complication (HCC)    Diabetic neuropathy (HCC) 07/28/2014   Dysrhythmia    Erectile dysfunction    GERD (gastroesophageal reflux disease)    High risk medication use 05/26/2018   Hypertensive heart disease with heart failure (HCC) 07/30/2014   Leukocytosis 11/26/2021   Limb ischemia 01/26/2023   Lumbago    LV dysfunction 04/28/2017   Mixed hyperlipidemia 07/28/2014   OSA (obstructive sleep apnea)    CPAP   PAD (peripheral artery disease) (HCC) 01/26/2023   Paroxysmal  atrial fibrillation (HCC) 07/28/2014   Peripheral arterial disease (HCC)    Postop check 01/06/2022   S/P CABG x 3 11/12/2021   Sinus node dysfunction (HCC) 04/21/2016   Testicular hypofunction    Uncontrolled type 2 diabetes mellitus with microalbuminuric diabetic nephropathy 07/28/2014    Allergies  Allergen Reactions   Tape     Plastic tape -needs paper tape   Objective:  [name] [name] is a pleasant 68 y.o. male in NAD. AAO x 3.  Vascular Examination: Patient has palpable DP pulse, absent PT pulse right foot.  Delayed capillary refill right toes.  Sparse digital hair right foot  Proximal to distal cooling WNL.   Dermatological Examination: Interspaces are clear with no open lesions noted right foot.  Skin is shiny and atrophic.  Nails are 3-85mm thick, with yellowish/brown discoloration, subungual debris and distal onycholysis x5.  There is pain with compression of nails x5.       Latest Ref Rng & Units 01/28/2023   11:56 PM  Hemoglobin A1C  Hemoglobin-A1c 4.8 - 5.6 % 5.9    Patient qualifies for at-risk foot care because of left BKA .  Assessment/Plan: 1. Dermatophytosis of nail   2. S/P BKA (below knee amputation), left (HCC)   3. Pain in right toe(s)   4. Type II diabetes mellitus with peripheral circulatory disorder (HCC)    Mycotic nails x5 were sharply debrided  with sterile nail nippers and power debriding burr to decrease bulk and length.  Return if symptoms worsen or fail to improve.   Clerance Lav, DPM, FACFAS [facility]     [address] [place], [place] [address]                Office [phone]  Fax [phone]

## 2023-07-17 ENCOUNTER — Ambulatory Visit: Payer: Medicare HMO | Admitting: Podiatry

## 2023-07-17 DIAGNOSIS — Z992 Dependence on renal dialysis: Secondary | ICD-10-CM | POA: Diagnosis not present

## 2023-07-17 DIAGNOSIS — N2581 Secondary hyperparathyroidism of renal origin: Secondary | ICD-10-CM | POA: Diagnosis not present

## 2023-07-17 DIAGNOSIS — N186 End stage renal disease: Secondary | ICD-10-CM | POA: Diagnosis not present

## 2023-07-20 DIAGNOSIS — N186 End stage renal disease: Secondary | ICD-10-CM | POA: Diagnosis not present

## 2023-07-20 DIAGNOSIS — N2581 Secondary hyperparathyroidism of renal origin: Secondary | ICD-10-CM | POA: Diagnosis not present

## 2023-07-20 DIAGNOSIS — Z992 Dependence on renal dialysis: Secondary | ICD-10-CM | POA: Diagnosis not present

## 2023-07-21 ENCOUNTER — Encounter (HOSPITAL_BASED_OUTPATIENT_CLINIC_OR_DEPARTMENT_OTHER): Payer: Medicare HMO | Attending: Internal Medicine | Admitting: Internal Medicine

## 2023-07-21 DIAGNOSIS — T8131XA Disruption of external operation (surgical) wound, not elsewhere classified, initial encounter: Secondary | ICD-10-CM | POA: Insufficient documentation

## 2023-07-21 DIAGNOSIS — Z89512 Acquired absence of left leg below knee: Secondary | ICD-10-CM | POA: Insufficient documentation

## 2023-07-21 DIAGNOSIS — N186 End stage renal disease: Secondary | ICD-10-CM | POA: Insufficient documentation

## 2023-07-21 DIAGNOSIS — I70235 Atherosclerosis of native arteries of right leg with ulceration of other part of foot: Secondary | ICD-10-CM | POA: Diagnosis not present

## 2023-07-21 DIAGNOSIS — X58XXXA Exposure to other specified factors, initial encounter: Secondary | ICD-10-CM | POA: Diagnosis not present

## 2023-07-21 DIAGNOSIS — E11621 Type 2 diabetes mellitus with foot ulcer: Secondary | ICD-10-CM | POA: Diagnosis not present

## 2023-07-21 DIAGNOSIS — L97822 Non-pressure chronic ulcer of other part of left lower leg with fat layer exposed: Secondary | ICD-10-CM | POA: Diagnosis not present

## 2023-07-21 DIAGNOSIS — L97518 Non-pressure chronic ulcer of other part of right foot with other specified severity: Secondary | ICD-10-CM | POA: Diagnosis not present

## 2023-07-21 DIAGNOSIS — Z794 Long term (current) use of insulin: Secondary | ICD-10-CM | POA: Insufficient documentation

## 2023-07-22 DIAGNOSIS — E11621 Type 2 diabetes mellitus with foot ulcer: Secondary | ICD-10-CM | POA: Diagnosis not present

## 2023-07-22 DIAGNOSIS — L97519 Non-pressure chronic ulcer of other part of right foot with unspecified severity: Secondary | ICD-10-CM | POA: Diagnosis not present

## 2023-07-22 DIAGNOSIS — E1151 Type 2 diabetes mellitus with diabetic peripheral angiopathy without gangrene: Secondary | ICD-10-CM | POA: Diagnosis not present

## 2023-07-22 DIAGNOSIS — I70223 Atherosclerosis of native arteries of extremities with rest pain, bilateral legs: Secondary | ICD-10-CM | POA: Diagnosis not present

## 2023-07-22 DIAGNOSIS — Z89512 Acquired absence of left leg below knee: Secondary | ICD-10-CM | POA: Diagnosis not present

## 2023-07-22 DIAGNOSIS — E1140 Type 2 diabetes mellitus with diabetic neuropathy, unspecified: Secondary | ICD-10-CM | POA: Diagnosis not present

## 2023-07-22 DIAGNOSIS — L89510 Pressure ulcer of right ankle, unstageable: Secondary | ICD-10-CM | POA: Diagnosis not present

## 2023-07-22 DIAGNOSIS — Z4781 Encounter for orthopedic aftercare following surgical amputation: Secondary | ICD-10-CM | POA: Diagnosis not present

## 2023-07-22 DIAGNOSIS — L89610 Pressure ulcer of right heel, unstageable: Secondary | ICD-10-CM | POA: Diagnosis not present

## 2023-07-23 ENCOUNTER — Ambulatory Visit (HOSPITAL_COMMUNITY): Payer: Medicare HMO | Admitting: Internal Medicine

## 2023-07-23 DIAGNOSIS — E1140 Type 2 diabetes mellitus with diabetic neuropathy, unspecified: Secondary | ICD-10-CM | POA: Diagnosis not present

## 2023-07-23 DIAGNOSIS — Z4781 Encounter for orthopedic aftercare following surgical amputation: Secondary | ICD-10-CM | POA: Diagnosis not present

## 2023-07-23 DIAGNOSIS — L97519 Non-pressure chronic ulcer of other part of right foot with unspecified severity: Secondary | ICD-10-CM | POA: Diagnosis not present

## 2023-07-23 DIAGNOSIS — L89510 Pressure ulcer of right ankle, unstageable: Secondary | ICD-10-CM | POA: Diagnosis not present

## 2023-07-23 DIAGNOSIS — I70223 Atherosclerosis of native arteries of extremities with rest pain, bilateral legs: Secondary | ICD-10-CM | POA: Diagnosis not present

## 2023-07-23 DIAGNOSIS — E11621 Type 2 diabetes mellitus with foot ulcer: Secondary | ICD-10-CM | POA: Diagnosis not present

## 2023-07-23 DIAGNOSIS — E1151 Type 2 diabetes mellitus with diabetic peripheral angiopathy without gangrene: Secondary | ICD-10-CM | POA: Diagnosis not present

## 2023-07-23 DIAGNOSIS — Z89512 Acquired absence of left leg below knee: Secondary | ICD-10-CM | POA: Diagnosis not present

## 2023-07-23 DIAGNOSIS — L89610 Pressure ulcer of right heel, unstageable: Secondary | ICD-10-CM | POA: Diagnosis not present

## 2023-07-23 NOTE — Progress Notes (Incomplete)
Primary Care Physician: [name], PA-C Primary Cardiologist: Dr. Dulce Sellar Electrophysiologist: Will Jorja Loa, MD  {Click to update primary MD,subspecialty MD or APP then REFRESH:1}   Referring Physician: Dr. Dulce Sellar  {Removed [facility], PMH, PSH, ALLERGY, CMED, and SOC :1}   [name] [name] is a 68 y.o. male with a history of HTN, T2DM, ESRD, CAD, chronic diastolic CHF, PAD, HLD, persistent atrial fibrillation, and atrial flutter who presents for consultation in the [facility]. Noted by Cardiology 02/2023 and 05/2023 to be in atypical atrial flutter; on amiodarone therapy. He is scheduled for right foot debridement on 09/01/23. Patient is on Eliquis 5 mg BID for a CHADS2VASC score of 5.  On evaluation today, he is currently in ***.   Today, he denies symptoms of ***palpitations, chest pain, shortness of breath, orthopnea, PND, lower extremity edema, dizziness, presyncope, syncope, snoring, daytime somnolence, bleeding, or neurologic sequela. The patient is tolerating medications without difficulties and is otherwise without complaint today.    Atrial Fibrillation Risk Factors:  he {Action; does/does not:19097} have symptoms or diagnosis of sleep apnea. he {ACTION; IS/IS [id number]} compliant with CPAP therapy. he {Action; does/does not:19097} have a history of rheumatic fever. he {Action; does/does not:19097} have a history of alcohol use. The patient {Action; does/does not:19097} have a history of early familial atrial fibrillation or other arrhythmias.  he has a BMI of There is no height or weight on file to calculate BMI.. There were no vitals filed for this visit.  Current Outpatient Medications  Medication Sig Dispense Refill   Accu-Chek Softclix Lancets lancets 1 each by Other route daily.     albuterol (VENTOLIN HFA) 108 (90 Base) MCG/ACT inhaler Inhale 2 puffs into the lungs every 6 (six) hours as needed for wheezing or shortness of  breath.     amiodarone (PACERONE) 200 MG tablet TAKE 1 TABLET TWICE DAILY 180 tablet 3   apixaban (ELIQUIS) 5 MG TABS tablet Take 1 tablet (5 mg total) by mouth 2 (two) times daily.     atorvastatin (LIPITOR) 80 MG tablet Take 1 tablet (80 mg total) by mouth daily. 90 tablet 2   B Complex-C-Folic Acid (DIALYVITE 800) 0.8 MG TABS Take 0.8 mg by mouth daily.     Blood Glucose Monitoring Suppl (ACCU-CHEK GUIDE ME) w/Device KIT 1 Device by Other route 3 (three) times daily.     collagenase (SANTYL) 250 UNIT/GM ointment Apply 1 Application topically daily. 90 each 1   Dulaglutide (TRULICITY) 3 MG/0.5ML SOPN Inject 3 mg into the skin every Wednesday.     fluticasone furoate-vilanterol (BREO ELLIPTA) 200-25 MCG/ACT AEPB Inhale 1 puff into the lungs daily.  3   glucose blood test strip 1 each by Other route 3 (three) times daily.     insulin glargine (LANTUS) 100 UNIT/ML injection Inject 0.2 mLs (20 Units total) into the skin 2 (two) times daily.     Insulin Pen Needle 32G X 4 MM MISC Use to inject Levemir 2 (two) times daily. 100 each 1   midodrine (PROAMATINE) 5 MG tablet TAKE 1 TABLET EVERY DAY 90 tablet 2   torsemide (DEMADEX) 20 MG tablet Take 1 tablet (20 mg total) by mouth 2 (two) times daily. 360 tablet 1   No current facility-administered medications for this visit.    Atrial Fibrillation Management history:  Previous antiarrhythmic drugs: amiodarone Previous cardioversions: 12/2014, 6/23, 9/23 Previous ablations: none Anticoagulation history: Eliquis   ROS- All systems are reviewed and negative except as  per the HPI above.  Physical Exam: There were no vitals taken for this visit.  GEN: Well nourished, well developed in no acute distress NECK: No JVD; No carotid bruits CARDIAC: {EPRHYTHM:[id number]}, no murmurs, rubs, gallops RESPIRATORY:  Clear to auscultation without rales, wheezing or rhonchi  ABDOMEN: Soft, non-tender, non-distended EXTREMITIES:  No edema; No deformity   EKG  today demonstrates ***  Echo 11/13/21 demonstrated   1. Left ventricular ejection fraction, by estimation, is 55 to 60%. The  left ventricle has normal function. Left ventricular endocardial border  not optimally defined to evaluate regional wall motion. Left ventricular  diastolic parameters are consistent  with Grade II diastolic dysfunction (pseudonormalization).   2. Peak RV-RA gradient 28 mmHg. Right ventricular systolic function was  not well visualized. The right ventricular size is not well visualized.   3. The mitral valve was not well visualized. No evidence of mitral valve  regurgitation. No evidence of mitral stenosis.   4. The aortic valve is tricuspid. There is mild calcification of the  aortic valve. Aortic valve regurgitation is not visualized. Aortic valve  sclerosis is present, with no evidence of aortic valve stenosis.   5. No significant pericardial effusion.   6. The heart is poorly visualized. Grossly, LV systolic function appears  to be in the normal range.    ASSESSMENT & PLAN CHA2DS2-VASc Score = 5  The patient's score is based upon: CHF History: 1 HTN History: 1 Diabetes History: 1 Stroke History: 0 Vascular Disease History: 1 Age Score: 1 Gender Score: 0   {Confirm score is correct.  If not, click here to update score.  REFRESH note.  :1}    ASSESSMENT AND PLAN: {Select the correct AFib Diagnosis                 :[id number]} Persistent fib / flutter   Follow up ***   [place] Bells, PA-C  Af[facility] [address] [place], [place] [address] [phone]

## 2023-07-24 DIAGNOSIS — N2581 Secondary hyperparathyroidism of renal origin: Secondary | ICD-10-CM | POA: Diagnosis not present

## 2023-07-24 DIAGNOSIS — Z992 Dependence on renal dialysis: Secondary | ICD-10-CM | POA: Diagnosis not present

## 2023-07-24 DIAGNOSIS — N179 Acute kidney failure, unspecified: Secondary | ICD-10-CM | POA: Diagnosis not present

## 2023-07-24 DIAGNOSIS — N186 End stage renal disease: Secondary | ICD-10-CM | POA: Diagnosis not present

## 2023-07-27 DIAGNOSIS — Z992 Dependence on renal dialysis: Secondary | ICD-10-CM | POA: Diagnosis not present

## 2023-07-27 DIAGNOSIS — N186 End stage renal disease: Secondary | ICD-10-CM | POA: Diagnosis not present

## 2023-07-27 DIAGNOSIS — N2581 Secondary hyperparathyroidism of renal origin: Secondary | ICD-10-CM | POA: Diagnosis not present

## 2023-07-29 DIAGNOSIS — L89510 Pressure ulcer of right ankle, unstageable: Secondary | ICD-10-CM | POA: Diagnosis not present

## 2023-07-29 DIAGNOSIS — E11621 Type 2 diabetes mellitus with foot ulcer: Secondary | ICD-10-CM | POA: Diagnosis not present

## 2023-07-29 DIAGNOSIS — I70223 Atherosclerosis of native arteries of extremities with rest pain, bilateral legs: Secondary | ICD-10-CM | POA: Diagnosis not present

## 2023-07-29 DIAGNOSIS — Z4781 Encounter for orthopedic aftercare following surgical amputation: Secondary | ICD-10-CM | POA: Diagnosis not present

## 2023-07-29 DIAGNOSIS — L89610 Pressure ulcer of right heel, unstageable: Secondary | ICD-10-CM | POA: Diagnosis not present

## 2023-07-29 DIAGNOSIS — L97519 Non-pressure chronic ulcer of other part of right foot with unspecified severity: Secondary | ICD-10-CM | POA: Diagnosis not present

## 2023-07-29 DIAGNOSIS — Z89512 Acquired absence of left leg below knee: Secondary | ICD-10-CM | POA: Diagnosis not present

## 2023-07-29 DIAGNOSIS — E1140 Type 2 diabetes mellitus with diabetic neuropathy, unspecified: Secondary | ICD-10-CM | POA: Diagnosis not present

## 2023-07-29 DIAGNOSIS — E1151 Type 2 diabetes mellitus with diabetic peripheral angiopathy without gangrene: Secondary | ICD-10-CM | POA: Diagnosis not present

## 2023-07-30 ENCOUNTER — Encounter (HOSPITAL_BASED_OUTPATIENT_CLINIC_OR_DEPARTMENT_OTHER): Payer: Medicare HMO | Attending: Internal Medicine | Admitting: Internal Medicine

## 2023-07-30 DIAGNOSIS — E1151 Type 2 diabetes mellitus with diabetic peripheral angiopathy without gangrene: Secondary | ICD-10-CM | POA: Insufficient documentation

## 2023-07-30 DIAGNOSIS — I509 Heart failure, unspecified: Secondary | ICD-10-CM | POA: Insufficient documentation

## 2023-07-30 DIAGNOSIS — T8189XA Other complications of procedures, not elsewhere classified, initial encounter: Secondary | ICD-10-CM | POA: Diagnosis not present

## 2023-07-30 DIAGNOSIS — L97518 Non-pressure chronic ulcer of other part of right foot with other specified severity: Secondary | ICD-10-CM

## 2023-07-30 DIAGNOSIS — E11621 Type 2 diabetes mellitus with foot ulcer: Secondary | ICD-10-CM

## 2023-07-30 DIAGNOSIS — Y835 Amputation of limb(s) as the cause of abnormal reaction of the patient, or of later complication, without mention of misadventure at the time of the procedure: Secondary | ICD-10-CM | POA: Insufficient documentation

## 2023-07-30 DIAGNOSIS — Z89512 Acquired absence of left leg below knee: Secondary | ICD-10-CM

## 2023-07-30 DIAGNOSIS — G4730 Sleep apnea, unspecified: Secondary | ICD-10-CM | POA: Insufficient documentation

## 2023-07-30 DIAGNOSIS — I110 Hypertensive heart disease with heart failure: Secondary | ICD-10-CM | POA: Diagnosis not present

## 2023-07-30 DIAGNOSIS — L97822 Non-pressure chronic ulcer of other part of left lower leg with fat layer exposed: Secondary | ICD-10-CM

## 2023-07-31 DIAGNOSIS — N2581 Secondary hyperparathyroidism of renal origin: Secondary | ICD-10-CM | POA: Diagnosis not present

## 2023-07-31 DIAGNOSIS — N186 End stage renal disease: Secondary | ICD-10-CM | POA: Diagnosis not present

## 2023-07-31 DIAGNOSIS — Z992 Dependence on renal dialysis: Secondary | ICD-10-CM | POA: Diagnosis not present

## 2023-08-05 DIAGNOSIS — L89510 Pressure ulcer of right ankle, unstageable: Secondary | ICD-10-CM | POA: Diagnosis not present

## 2023-08-05 DIAGNOSIS — Z4781 Encounter for orthopedic aftercare following surgical amputation: Secondary | ICD-10-CM | POA: Diagnosis not present

## 2023-08-05 DIAGNOSIS — E1151 Type 2 diabetes mellitus with diabetic peripheral angiopathy without gangrene: Secondary | ICD-10-CM | POA: Diagnosis not present

## 2023-08-05 DIAGNOSIS — L97519 Non-pressure chronic ulcer of other part of right foot with unspecified severity: Secondary | ICD-10-CM | POA: Diagnosis not present

## 2023-08-05 DIAGNOSIS — I70223 Atherosclerosis of native arteries of extremities with rest pain, bilateral legs: Secondary | ICD-10-CM | POA: Diagnosis not present

## 2023-08-05 DIAGNOSIS — E11621 Type 2 diabetes mellitus with foot ulcer: Secondary | ICD-10-CM | POA: Diagnosis not present

## 2023-08-05 DIAGNOSIS — L89610 Pressure ulcer of right heel, unstageable: Secondary | ICD-10-CM | POA: Diagnosis not present

## 2023-08-05 DIAGNOSIS — Z89512 Acquired absence of left leg below knee: Secondary | ICD-10-CM | POA: Diagnosis not present

## 2023-08-05 DIAGNOSIS — E1140 Type 2 diabetes mellitus with diabetic neuropathy, unspecified: Secondary | ICD-10-CM | POA: Diagnosis not present

## 2023-08-06 ENCOUNTER — Encounter (HOSPITAL_BASED_OUTPATIENT_CLINIC_OR_DEPARTMENT_OTHER): Payer: Medicare HMO | Admitting: Internal Medicine

## 2023-08-06 DIAGNOSIS — E11621 Type 2 diabetes mellitus with foot ulcer: Secondary | ICD-10-CM

## 2023-08-06 DIAGNOSIS — T8189XA Other complications of procedures, not elsewhere classified, initial encounter: Secondary | ICD-10-CM | POA: Diagnosis not present

## 2023-08-06 DIAGNOSIS — L97518 Non-pressure chronic ulcer of other part of right foot with other specified severity: Secondary | ICD-10-CM

## 2023-08-06 DIAGNOSIS — I509 Heart failure, unspecified: Secondary | ICD-10-CM | POA: Diagnosis not present

## 2023-08-06 DIAGNOSIS — E1151 Type 2 diabetes mellitus with diabetic peripheral angiopathy without gangrene: Secondary | ICD-10-CM | POA: Diagnosis not present

## 2023-08-06 DIAGNOSIS — I110 Hypertensive heart disease with heart failure: Secondary | ICD-10-CM | POA: Diagnosis not present

## 2023-08-06 DIAGNOSIS — G4730 Sleep apnea, unspecified: Secondary | ICD-10-CM | POA: Diagnosis not present

## 2023-08-07 DIAGNOSIS — Z992 Dependence on renal dialysis: Secondary | ICD-10-CM | POA: Diagnosis not present

## 2023-08-07 DIAGNOSIS — N2581 Secondary hyperparathyroidism of renal origin: Secondary | ICD-10-CM | POA: Diagnosis not present

## 2023-08-07 DIAGNOSIS — N186 End stage renal disease: Secondary | ICD-10-CM | POA: Diagnosis not present

## 2023-08-10 ENCOUNTER — Other Ambulatory Visit: Payer: Self-pay | Admitting: Cardiology

## 2023-08-10 DIAGNOSIS — Z992 Dependence on renal dialysis: Secondary | ICD-10-CM | POA: Diagnosis not present

## 2023-08-10 DIAGNOSIS — N186 End stage renal disease: Secondary | ICD-10-CM | POA: Diagnosis not present

## 2023-08-10 DIAGNOSIS — N2581 Secondary hyperparathyroidism of renal origin: Secondary | ICD-10-CM | POA: Diagnosis not present

## 2023-08-10 NOTE — Telephone Encounter (Signed)
 Prescription sent to pharmacy.

## 2023-08-11 ENCOUNTER — Telehealth: Payer: Self-pay

## 2023-08-11 ENCOUNTER — Encounter: Payer: Medicare HMO | Admitting: Surgical

## 2023-08-11 ENCOUNTER — Encounter: Payer: Self-pay | Admitting: Surgical

## 2023-08-11 ENCOUNTER — Telehealth: Payer: Self-pay | Admitting: Surgical

## 2023-08-11 DIAGNOSIS — Z89512 Acquired absence of left leg below knee: Secondary | ICD-10-CM | POA: Diagnosis not present

## 2023-08-11 DIAGNOSIS — E1151 Type 2 diabetes mellitus with diabetic peripheral angiopathy without gangrene: Secondary | ICD-10-CM | POA: Diagnosis not present

## 2023-08-11 DIAGNOSIS — L89510 Pressure ulcer of right ankle, unstageable: Secondary | ICD-10-CM | POA: Diagnosis not present

## 2023-08-11 DIAGNOSIS — E11621 Type 2 diabetes mellitus with foot ulcer: Secondary | ICD-10-CM | POA: Diagnosis not present

## 2023-08-11 DIAGNOSIS — L97519 Non-pressure chronic ulcer of other part of right foot with unspecified severity: Secondary | ICD-10-CM | POA: Diagnosis not present

## 2023-08-11 DIAGNOSIS — I70223 Atherosclerosis of native arteries of extremities with rest pain, bilateral legs: Secondary | ICD-10-CM | POA: Diagnosis not present

## 2023-08-11 DIAGNOSIS — E1140 Type 2 diabetes mellitus with diabetic neuropathy, unspecified: Secondary | ICD-10-CM | POA: Diagnosis not present

## 2023-08-11 DIAGNOSIS — L89610 Pressure ulcer of right heel, unstageable: Secondary | ICD-10-CM | POA: Diagnosis not present

## 2023-08-11 DIAGNOSIS — Z4781 Encounter for orthopedic aftercare following surgical amputation: Secondary | ICD-10-CM | POA: Diagnosis not present

## 2023-08-11 NOTE — Telephone Encounter (Signed)
 Pt missed his preop, called pt 08-11-23, he was r/s to next week 08-18-23 at 230pm I reminded pt he must be here 15 mins prior to apt. He said he was sorry that he forgot to write it down on his calendar.

## 2023-08-11 NOTE — Progress Notes (Deleted)
 Patient ID: [name] [name], male    DOB: 04-04-1956, 68 y.o.   MRN: [id number]  No chief complaint on file.     ICD-10-CM   1. Wound of right leg, initial encounter  S81.801A      History of Present Illness: [name] [name] is a 68 y.o.  male  with a history of peripheral artery disease, history of left BKA, followed by vascular surgery history of right lower extremity bypass.  He presents for preoperative evaluation for upcoming procedure, right foot debridement and possible placement of myriad, scheduled for 09/01/2023 with Dr. Ladona Ridgel.  The patient {HAS HAS [id number]} had problems with anesthesia. ***  Summary of Previous Visit: ***  Job: ***  PMH Significant for: Patient has significant history of cardiac vascular disease and kidney disease.  He is on dialysis.  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  Also has a history of atypical atrial flutter on amiodarone.  Sinus node dysfunction.  He is followed by cardiology regularly with most recent appointment on 06/23/2023. He does have a history of PAD and is followed by vascular surgery.  Has a history of left BKA.  He did undergo right CFA to BK popliteal artery bypass September 2024.  He has been following up with wound care clinic  He was seen by cardiology December 2024, in sinus rhythm.  CAD was stable.  Past Medical History: Allergies: Allergies  Allergen Reactions  . Tape     Plastic tape -needs paper tape    Current Medications:  Current Outpatient Medications:  .  Accu-Chek Softclix Lancets lancets, 1 each by Other route daily., Disp: , Rfl:  .  albuterol (VENTOLIN HFA) 108 (90 Base) MCG/ACT inhaler, Inhale 2 puffs into the lungs every 6 (six) hours as needed for wheezing or shortness of breath., Disp: , Rfl:  .  amiodarone (PACERONE) 200 MG tablet, TAKE 1 TABLET TWICE DAILY,  Disp: 180 tablet, Rfl: 3 .  apixaban (ELIQUIS) 5 MG TABS tablet, Take 1 tablet (5 mg total) by mouth 2 (two) times daily., Disp: , Rfl:  .  atorvastatin (LIPITOR) 80 MG tablet, Take 1 tablet (80 mg total) by mouth daily., Disp: 90 tablet, Rfl: 2 .  B Complex-C-Folic Acid (DIALYVITE 800) 0.8 MG TABS, Take 0.8 mg by mouth daily., Disp: , Rfl:  .  Blood Glucose Monitoring Suppl (ACCU-CHEK GUIDE ME) w/Device KIT, 1 Device by Other route 3 (three) times daily., Disp: , Rfl:  .  collagenase (SANTYL) 250 UNIT/GM ointment, Apply 1 Application topically daily., Disp: 90 each, Rfl: 1 .  Dulaglutide (TRULICITY) 3 MG/0.5ML SOPN, Inject 3 mg into the skin every Wednesday., Disp: , Rfl:  .  fluticasone furoate-vilanterol (BREO ELLIPTA) 200-25 MCG/ACT AEPB, Inhale 1 puff into the lungs daily., Disp: , Rfl: 3 .  glucose blood test strip, 1 each by Other route 3 (three) times daily., Disp: , Rfl:  .  insulin glargine (LANTUS) 100 UNIT/ML injection, Inject 0.2 mLs (20 Units total) into the skin 2 (two) times daily., Disp: , Rfl:  .  Insulin Pen Needle 32G X 4 MM MISC, Use to inject Levemir 2 (two) times daily., Disp: 100 each, Rfl: 1 .  midodrine (PROAMATINE) 5 MG tablet, TAKE 1 TABLET EVERY DAY, Disp: 90 tablet, Rfl: 2 .  torsemide (DEMADEX) 20 MG tablet, TAKE 1 TABLET EVERY DAY, Disp: 180 tablet, Rfl: 2  Past Medical Problems: Past Medical History:  Diagnosis Date  . Acute kidney failure, unspecified (HCC) 12/05/2021  . Anemia 04/17/2022   Blood transfusion  . Atherosclerosis of native arteries of extremities with rest pain, right leg (HCC) 03/23/2023  . Atherosclerotic heart disease   . Benign essential HTN 07/30/2014  . Bradycardia 01/25/2015  . Cardiomyopathy, unspecified (HCC) 12/03/2021  . CHF (congestive heart failure) (HCC)   . Chronic anticoagulation 02/25/2018  . Chronic diastolic CHF (congestive heart failure) (HCC) 07/15/2021  . Chronic systolic CHF (congestive heart failure), NYHA class 3  (HCC) 08/30/2014   Overview:  Global ef 30%  . CKD (chronic kidney disease)   . Class 3 severe obesity in adult (HCC) 04/28/2017  . Coronary artery disease   . Critical limb ischemia of right lower extremity (HCC) 03/19/2023  . Diabetes mellitus without complication (HCC)   . Diabetic neuropathy (HCC) 07/28/2014  . Dysrhythmia   . Erectile dysfunction   . GERD (gastroesophageal reflux disease)   . High risk medication use 05/26/2018  . Hypertensive heart disease with heart failure (HCC) 07/30/2014  . Leukocytosis 11/26/2021  . Limb ischemia 01/26/2023  . Lumbago   . LV dysfunction 04/28/2017  . Mixed hyperlipidemia 07/28/2014  . OSA (obstructive sleep apnea)    CPAP  . PAD (peripheral artery disease) (HCC) 01/26/2023  . Paroxysmal atrial fibrillation (HCC) 07/28/2014  . Peripheral arterial disease (HCC)   . Postop check 01/06/2022  . S/P CABG x 3 11/12/2021  . Sinus node dysfunction (HCC) 04/21/2016  . Testicular hypofunction   . Uncontrolled type 2 diabetes mellitus with microalbuminuric diabetic nephropathy 07/28/2014    Past Surgical History: Past Surgical History:  Procedure Laterality Date  . ABDOMINAL AORTOGRAM W/LOWER EXTREMITY N/A 12/29/2022   Procedure: ABDOMINAL AORTOGRAM W/LOWER EXTREMITY;  Surgeon: Maeola Harman, MD;  Location: [facility] INVASIVE CV LAB;  Service: Cardiovascular;  Laterality: N/A;  . ABDOMINAL AORTOGRAM W/LOWER EXTREMITY N/A 01/22/2023   Procedure: ABDOMINAL AORTOGRAM W/LOWER EXTREMITY;  Surgeon: [name], MD;  Location: [facility] INVASIVE CV LAB;  Service: Cardiovascular;  Laterality: N/A;  . ABDOMINAL AORTOGRAM W/LOWER EXTREMITY N/A 03/06/2023   Procedure: ABDOMINAL AORTOGRAM W/LOWER EXTREMITY;  Surgeon: Leonie Pheng, MD;  Location: [facility] INVASIVE CV LAB;  Service: Cardiovascular;  Laterality: N/A;  . AMPUTATION Left 01/26/2023   Procedure: LEFT AMPUTATION BELOW KNEE;  Surgeon: Leonie Aniket, MD;  Location: [facility] OR;  Service: Vascular;   Laterality: Left;  with regional block  . APPLICATION OF WOUND VAC Left 01/26/2023   Procedure: APPLICATION OF WOUND VAC, LEFT BELOW THE KNEE AMPUTATION STUMP;  Surgeon: Leonie Cathy, MD;  Location: [facility] OR;  Service: Vascular;  Laterality: Left;  With regional block  . APPLICATION OF WOUND VAC Left 02/10/2023   Procedure: APPLICATION OF WOUND VAC;  Surgeon: Chuck Hint, MD;  Location: [facility] OR;  Service: Vascular;  Laterality: Left;  . AV FISTULA PLACEMENT Right 05/20/2022   Procedure: RIGHT ARM ARTERIOVENOUS (AV) FISTULA CREATION;  Surgeon: Larina Earthly, MD;  Location: [facility] OR;  Service: Vascular;  Laterality: Right;  . BUBBLE STUDY  11/21/2021   Procedure: BUBBLE STUDY;  Surgeon: Laurey Morale, MD;  Location: [facility] ENDOSCOPY;  Service: Cardiovascular;;  . CARDIAC CATHETERIZATION    . CARDIOVERSION N/A 11/21/2021   Procedure: CARDIOVERSION;  Surgeon: Laurey Morale, MD;  Location: [facility] ENDOSCOPY;  Service: Cardiovascular;  Laterality: N/A;  . CARDIOVERSION N/A 11/25/2021   Procedure: CARDIOVERSION;  Surgeon: Laurey Morale, MD;  Location: [facility]  ENDOSCOPY;  Service: Cardiovascular;  Laterality: N/A;  . CARDIOVERSION N/A 11/29/2021   Procedure: CARDIOVERSION;  Surgeon: Laurey Morale, MD;  Location: [facility] ENDOSCOPY;  Service: Cardiovascular;  Laterality: N/A;  . CARDIOVERSION N/A 02/27/2022   Procedure: CARDIOVERSION;  Surgeon: Little Ishikawa, MD;  Location: [facility] ENDOSCOPY;  Service: Cardiovascular;  Laterality: N/A;  . CORONARY ARTERY BYPASS GRAFT N/A 11/12/2021   Procedure: CORONARY ARTERY BYPASS GRAFTING (CABG) x  3 USING LEFT INTERNAL MAMMARY ARTERY AND RIGHT GREATER SAPHENOUS VEIN;  Surgeon: Lovett Sox, MD;  Location: [facility] OR;  Service: Open Heart Surgery;  Laterality: N/A;  . ELECTROPHYSIOLOGIC STUDY N/A 01/18/2015   Procedure: CARDIOVERSION;  Surgeon: Lamar Blinks, MD;  Location: [facility] ORS;  Service: Cardiovascular;  Laterality: N/A;  . ENDOVEIN HARVEST OF GREATER  SAPHENOUS VEIN  11/12/2021   Procedure: ENDOVEIN HARVEST OF GREATER SAPHENOUS VEIN;  Surgeon: Lovett Sox, MD;  Location: [facility] OR;  Service: Open Heart Surgery;;  . EYE SURGERY Bilateral 2022  . FEMORAL-POPLITEAL BYPASS GRAFT Right 03/19/2023   Procedure: RIGHT COMMON FEMORAL-BELOW KNEE POPLITEAL ARTERY BYPASS;  Surgeon: Leonie Shed, MD;  Location: [facility] OR;  Service: Vascular;  Laterality: Right;  . FISTULA SUPERFICIALIZATION Right 07/10/2022   Procedure: RIGHT ARM ARTERIOVENOUS FISTULA SUPERFICIALIZATION;  Surgeon: Maeola Harman, MD;  Location: [facility] OR;  Service: Vascular;  Laterality: Right;  . IR FLUORO GUIDE CV LINE RIGHT  11/28/2021  . IR US GUIDE VASC ACCESS RIGHT  11/28/2021  . LEFT HEART CATH AND CORONARY ANGIOGRAPHY N/A 10/24/2021   Procedure: LEFT HEART CATH AND CORONARY ANGIOGRAPHY;  Surgeon: Corky Crafts, MD;  Location: [facility] INVASIVE CV LAB;  Service: Cardiovascular;  Laterality: N/A;  . MAZE N/A 11/12/2021   Procedure: MAZE;  Surgeon: Lovett Sox, MD;  Location: [facility] OR;  Service: Open Heart Surgery;  Laterality: N/A;  . PERIPHERAL VASCULAR ATHERECTOMY  12/29/2022   Procedure: PERIPHERAL VASCULAR ATHERECTOMY;  Surgeon: Maeola Harman, MD;  Location: [facility] INVASIVE CV LAB;  Service: Cardiovascular;;  . PERIPHERAL VASCULAR INTERVENTION  12/29/2022   Procedure: PERIPHERAL VASCULAR INTERVENTION;  Surgeon: Maeola Harman, MD;  Location: [facility] INVASIVE CV LAB;  Service: Cardiovascular;;  Left SFA, Bilateral illiac  . PERIPHERAL VASCULAR INTERVENTION Right 03/06/2023   Procedure: PERIPHERAL VASCULAR INTERVENTION;  Surgeon: Leonie Zohair, MD;  Location: [facility] INVASIVE CV LAB;  Service: Cardiovascular;  Laterality: Right;  SFA  . STUMP REVISION Left 02/10/2023   Procedure: LEFT BELOW KNEE AMPUTATION REVISION;  Surgeon: Chuck Hint, MD;  Location: [facility] OR;  Service: Vascular;  Laterality: Left;  . TEE WITHOUT CARDIOVERSION N/A 11/12/2021   Procedure:  TRANSESOPHAGEAL ECHOCARDIOGRAM (TEE);  Surgeon: Lovett Sox, MD;  Location: [facility] OR;  Service: Open Heart Surgery;  Laterality: N/A;  . TEE WITHOUT CARDIOVERSION N/A 11/21/2021   Procedure: TRANSESOPHAGEAL ECHOCARDIOGRAM (TEE);  Surgeon: Laurey Morale, MD;  Location: [facility] ENDOSCOPY;  Service: Cardiovascular;  Laterality: N/A;    Social History: Social History   Socioeconomic History  . Marital status: Married    Spouse name: Not on file  . Number of children: Not on file  . Years of education: Not on file  . Highest education level: Not on file  Occupational History  . Not on file  Tobacco Use  . Smoking status: Former    Current packs/day: 0.00    Types: Cigarettes    Quit date: 2007    Years since quitting: 18.1  . Smokeless tobacco: Never  Vaping Use  . Vaping status: Never  Used  Substance and Sexual Activity  . Alcohol use: No  . Drug use: Not Currently    Frequency: 1.0 times per week    Types: Marijuana  . Sexual activity: Not on file  Other Topics Concern  . Not on file  Social History Narrative  . Not on file   Social Drivers of Health   Financial Resource Strain: Not on file  Food Insecurity: No Food Insecurity (02/05/2023)   Hunger Vital Sign   . Worried About Programme researcher, broadcasting/film/video in the Last Year: Never true   . Ran Out of Food in the Last Year: Never true  Transportation Needs: No Transportation Needs (02/05/2023)   PRAPARE - Transportation   . Lack of Transportation (Medical): No   . Lack of Transportation (Non-Medical): No  Physical Activity: Not on file  Stress: Not on file  Social Connections: Not on file  Intimate Partner Violence: Not At Risk (02/05/2023)   Humiliation, Afraid, Rape, and Kick questionnaire   . Fear of Current or Ex-Partner: No   . Emotionally Abused: No   . Physically Abused: No   . Sexually Abused: No    Family History: Family History  Problem Relation Age of Onset  . CAD Brother   . Drug abuse Brother   . Lung cancer  Father   . CAD Mother     Review of Systems: ROS  Physical Exam: Vital Signs There were no vitals taken for this visit.  Physical Exam *** Constitutional:      General: Not in acute distress.    Appearance: Normal appearance. Not ill-appearing.  HENT:     Head: Normocephalic and atraumatic.  Eyes:     Pupils: Pupils are equal, round Neck:     Musculoskeletal: Normal range of motion.  Cardiovascular:     Rate and Rhythm: Normal rate    Pulses: Normal pulses.  Pulmonary:     Effort: Pulmonary effort is normal. No respiratory distress.  Abdominal:     General: Abdomen is flat. There is no distension.  Musculoskeletal: Normal range of motion.  Skin:    General: Skin is warm and dry.     Findings: No erythema or rash.  Neurological:     General: No focal deficit present.     Mental Status: Alert and oriented to person, place, and time. Mental status is at baseline.     Motor: No weakness.  Psychiatric:        Mood and Affect: Mood normal.        Behavior: Behavior normal.    Assessment/Plan: The patient is scheduled for *** with Dr. {[id number]::"XBMWUX","[id number]"}.  Risks, benefits, and alternatives of procedure discussed, questions answered and consent obtained.    Smoking Status: ***; Counseling Given? ***   Caprini Score: ***; Risk Factors include: ***, BMI *** 25, and length of planned surgery. Recommendation for mechanical *** prophylaxis. Encourage early ambulation.   Pictures obtained: @consult ***  Post-op Rx sent to pharmacy: {Blank:[id number]::"Oxycodone, Zofran, Keflex","Oxycodone, Zofran"}  Patient was provided with the General Surgical Risk consent document and Pain Medication Agreement prior to their appointment.  They had adequate time to read through the risk consent documents and Pain Medication Agreement. We also discussed them in person together during this preop appointment. All of their questions were answered to their satisfaction.  Recommended  calling if they have any further questions.  Risk consent form and Pain Medication Agreement to be scanned into patient's chart.  ***   Electronically signed by:  Kermit Balo Sahory Nordling, PA-C 08/11/2023 10:02 AM

## 2023-08-11 NOTE — Telephone Encounter (Signed)
 Faxed surgical clearance to patient's cardiologist, Norman Herrlich, MD at [place], [place] request. Received fax success confirmation. Forwarded documents to [facility] and updated surgical clearance spreadsheet.

## 2023-08-13 ENCOUNTER — Encounter (HOSPITAL_BASED_OUTPATIENT_CLINIC_OR_DEPARTMENT_OTHER): Payer: Medicare HMO | Admitting: Internal Medicine

## 2023-08-13 ENCOUNTER — Telehealth: Payer: Self-pay

## 2023-08-13 DIAGNOSIS — E11621 Type 2 diabetes mellitus with foot ulcer: Secondary | ICD-10-CM | POA: Diagnosis not present

## 2023-08-13 DIAGNOSIS — Z89512 Acquired absence of left leg below knee: Secondary | ICD-10-CM

## 2023-08-13 DIAGNOSIS — T8131XA Disruption of external operation (surgical) wound, not elsewhere classified, initial encounter: Secondary | ICD-10-CM | POA: Diagnosis not present

## 2023-08-13 DIAGNOSIS — E1151 Type 2 diabetes mellitus with diabetic peripheral angiopathy without gangrene: Secondary | ICD-10-CM | POA: Diagnosis not present

## 2023-08-13 DIAGNOSIS — T8189XA Other complications of procedures, not elsewhere classified, initial encounter: Secondary | ICD-10-CM | POA: Diagnosis not present

## 2023-08-13 DIAGNOSIS — G4730 Sleep apnea, unspecified: Secondary | ICD-10-CM | POA: Diagnosis not present

## 2023-08-13 DIAGNOSIS — L97518 Non-pressure chronic ulcer of other part of right foot with other specified severity: Secondary | ICD-10-CM | POA: Diagnosis not present

## 2023-08-13 DIAGNOSIS — N186 End stage renal disease: Secondary | ICD-10-CM | POA: Diagnosis not present

## 2023-08-13 DIAGNOSIS — I110 Hypertensive heart disease with heart failure: Secondary | ICD-10-CM | POA: Diagnosis not present

## 2023-08-13 DIAGNOSIS — L97822 Non-pressure chronic ulcer of other part of left lower leg with fat layer exposed: Secondary | ICD-10-CM | POA: Diagnosis not present

## 2023-08-13 DIAGNOSIS — I509 Heart failure, unspecified: Secondary | ICD-10-CM | POA: Diagnosis not present

## 2023-08-13 NOTE — Telephone Encounter (Signed)
 Pharmacy please advise on holding Eliquis prior to right foot debridement scheduled for 09/01/2023. Thank you.

## 2023-08-13 NOTE — Telephone Encounter (Signed)
   [facility] Pre-operative Risk Assessment    Request for surgical clearance:  What type of surgery is being performed? Right foot debridement and possible placement of myriad     When is this surgery scheduled? 09/01/2023   What type of clearance is required (medical clearance vs. Pharmacy clearance to hold med vs. Both)? Both  Are there any medications that need to be held prior to surgery and how long? Facility is asking [place] if we recommend holding medications   Practice name and name of physician performing surgery? Dr Weyman Croon with [facility]    What is your office phone number [phone]     7.   What is your office fax number [phone]  8.   Anesthesia type (None, local, MAC, general) ? General    [place] [name] [name] 08/13/2023, 1:38 PM  _________________________________________________________________   (provider comments below)

## 2023-08-14 DIAGNOSIS — Z992 Dependence on renal dialysis: Secondary | ICD-10-CM | POA: Diagnosis not present

## 2023-08-14 DIAGNOSIS — N2581 Secondary hyperparathyroidism of renal origin: Secondary | ICD-10-CM | POA: Diagnosis not present

## 2023-08-14 DIAGNOSIS — N186 End stage renal disease: Secondary | ICD-10-CM | POA: Diagnosis not present

## 2023-08-14 NOTE — Telephone Encounter (Signed)
   Name: Deckard Stuber Behney  DOB: 08/12/1955  MRN: [id number]  Primary Cardiologist: None   Preoperative team, please contact this patient and set up a phone call appointment for further preoperative risk assessment. Please obtain consent and complete medication review. Thank you for your help. Last seen by Dr. Dulce Sellar on 06/23/2023 with surgery scheduled in March past the 2 month mark.  I confirm that guidance regarding antiplatelet and oral anticoagulation therapy has been completed and, if necessary, noted below.  Per pharmacy Per office protocol, patient can hold Eliquis for 2 days prior to procedure.   Patient will not need bridging with Lovenox (enoxaparin) around procedure.  I also confirmed the patient resides in the state of [place]. As per [facility] Medical Board telemedicine laws, the patient must reside in the state in which the provider is licensed.   Joni Reining, NP 08/14/2023, 11:01 AM [facility]

## 2023-08-14 NOTE — Telephone Encounter (Signed)
 Patient with diagnosis of atrial fibrillation on Eliquis for anticoagulation.    What type of surgery is being performed? Right foot debridement and possible placement of myriad      When is this surgery scheduled? 09/01/2023   CHA2DS2-VASc Score = 5   This indicates a 7.2% annual risk of stroke. The patient's score is based upon: CHF History: 1 HTN History: 1 Diabetes History: 1 Stroke History: 0 Vascular Disease History: 1 Age Score: 1 Gender Score: 0   CrCl ESRD on dialysis Platelet count 331  Per office protocol, patient can hold Eliquis for 2 days prior to procedure.   Patient will not need bridging with Lovenox (enoxaparin) around procedure.  **This guidance is not considered finalized until pre-operative APP has relayed final recommendations.**

## 2023-08-17 DIAGNOSIS — N186 End stage renal disease: Secondary | ICD-10-CM | POA: Diagnosis not present

## 2023-08-17 DIAGNOSIS — Z992 Dependence on renal dialysis: Secondary | ICD-10-CM | POA: Diagnosis not present

## 2023-08-17 DIAGNOSIS — N2581 Secondary hyperparathyroidism of renal origin: Secondary | ICD-10-CM | POA: Diagnosis not present

## 2023-08-17 NOTE — Progress Notes (Unsigned)
 HISTORY AND PHYSICAL     CC:  follow up. Requesting Provider:  [name], PA-C  HPI: This is a 68 y.o. male who is here today for follow up for PAD.  Pt has hx of  left BKA on 01/26/2023 by Dr. Lenell Antu and revision of left BKA and evacuation of hematoma with placement of biologic dressing and vac placement 02/10/2023 by Dr. Edilia Bo.  On 03/19/2023 he underwent right CFA to BK popliteal artery bypass with 6mm PTFE in the subfascial tunnel by Dr. Lenell Antu for RLE ulceration.   He had hx of aortogram with laser atherectomy and stenting left SFA and popliteal arteries on 12/29/2022 by Dr. Randie Heinz for chronic LLE threatening ischemia with toe ulceration.    He underwent left great toe amputation by Dr. Marylene Land and subsequently underwent unsuccessful attempt at retrograde recanalization of the ATA for chronic total occlusion on 01/22/2023 by Dr. Chestine Spore.   Pt was last seen 06/10/2023 and at that time, he and his wife were doing dressing changes.  He had tendon exposed on the dorsum of the right foot.    He has hx of ESRD  The pt returns today for follow up.  He states he feels like the wound on his foot has gotten a little better.  He states the left BKA wound has also improved.  He does not have any feeling in his foot.    The pt is on a statin for cholesterol management.    The pt is not on an aspirin.    Other AC:  Eliquis The pt is on diuretic  The pt is is on medication for diabetes. Tobacco hx:  former  08/18/23: Patient returns for follow-up.  He is doing well overall.  His right foot is doing much better with local wound therapy.  He is due to see a plastic surgeon to discuss skin replacement therapy.  His left BKA is completely healed   Past Medical History:  Diagnosis Date   Acute kidney failure, unspecified (HCC) 12/05/2021   Anemia 04/17/2022   Blood transfusion   Atherosclerosis of native arteries of extremities with rest pain, right leg (HCC) 03/23/2023   Atherosclerotic heart disease     Benign essential HTN 07/30/2014   Bradycardia 01/25/2015   Cardiomyopathy, unspecified (HCC) 12/03/2021   CHF (congestive heart failure) (HCC)    Chronic anticoagulation 02/25/2018   Chronic diastolic CHF (congestive heart failure) (HCC) 07/15/2021   Chronic systolic CHF (congestive heart failure), NYHA class 3 (HCC) 08/30/2014   Overview:  Global ef 30%   CKD (chronic kidney disease)    Class 3 severe obesity in adult [facility]) 04/28/2017   Coronary artery disease    Critical limb ischemia of right lower extremity (HCC) 03/19/2023   Diabetes mellitus without complication (HCC)    Diabetic neuropathy (HCC) 07/28/2014   Dysrhythmia    Erectile dysfunction    GERD (gastroesophageal reflux disease)    High risk medication use 05/26/2018   Hypertensive heart disease with heart failure (HCC) 07/30/2014   Leukocytosis 11/26/2021   Limb ischemia 01/26/2023   Lumbago    LV dysfunction 04/28/2017   Mixed hyperlipidemia 07/28/2014   OSA (obstructive sleep apnea)    CPAP   PAD (peripheral artery disease) (HCC) 01/26/2023   Paroxysmal atrial fibrillation (HCC) 07/28/2014   Peripheral arterial disease (HCC)    Postop check 01/06/2022   S/P CABG x 3 11/12/2021   Sinus node dysfunction (HCC) 04/21/2016   Testicular hypofunction    Uncontrolled type 2  diabetes mellitus with microalbuminuric diabetic nephropathy 07/28/2014    Past Surgical History:  Procedure Laterality Date   ABDOMINAL AORTOGRAM W/LOWER EXTREMITY N/A 12/29/2022   Procedure: ABDOMINAL AORTOGRAM W/LOWER EXTREMITY;  Surgeon: Maeola Harman, MD;  Location: [facility] INVASIVE CV LAB;  Service: Cardiovascular;  Laterality: N/A;   ABDOMINAL AORTOGRAM W/LOWER EXTREMITY N/A 01/22/2023   Procedure: ABDOMINAL AORTOGRAM W/LOWER EXTREMITY;  Surgeon: [name], MD;  Location: [facility] INVASIVE CV LAB;  Service: Cardiovascular;  Laterality: N/A;   ABDOMINAL AORTOGRAM W/LOWER EXTREMITY N/A 03/06/2023   Procedure: ABDOMINAL AORTOGRAM  W/LOWER EXTREMITY;  Surgeon: Leonie Davaris, MD;  Location: [facility] INVASIVE CV LAB;  Service: Cardiovascular;  Laterality: N/A;   AMPUTATION Left 01/26/2023   Procedure: LEFT AMPUTATION BELOW KNEE;  Surgeon: Leonie Leelynn, MD;  Location: [facility] OR;  Service: Vascular;  Laterality: Left;  with regional block   APPLICATION OF WOUND VAC Left 01/26/2023   Procedure: APPLICATION OF WOUND VAC, LEFT BELOW THE KNEE AMPUTATION STUMP;  Surgeon: Leonie Ramzey, MD;  Location: [facility] OR;  Service: Vascular;  Laterality: Left;  With regional block   APPLICATION OF WOUND VAC Left 02/10/2023   Procedure: APPLICATION OF WOUND VAC;  Surgeon: Chuck Hint, MD;  Location: [facility] OR;  Service: Vascular;  Laterality: Left;   AV FISTULA PLACEMENT Right 05/20/2022   Procedure: RIGHT ARM ARTERIOVENOUS (AV) FISTULA CREATION;  Surgeon: Larina Earthly, MD;  Location: [facility] OR;  Service: Vascular;  Laterality: Right;   BUBBLE STUDY  11/21/2021   Procedure: BUBBLE STUDY;  Surgeon: Laurey Morale, MD;  Location: [facility] ENDOSCOPY;  Service: Cardiovascular;;   CARDIAC CATHETERIZATION     CARDIOVERSION N/A 11/21/2021   Procedure: CARDIOVERSION;  Surgeon: Laurey Morale, MD;  Location: [facility] ENDOSCOPY;  Service: Cardiovascular;  Laterality: N/A;   CARDIOVERSION N/A 11/25/2021   Procedure: CARDIOVERSION;  Surgeon: Laurey Morale, MD;  Location: [facility] ENDOSCOPY;  Service: Cardiovascular;  Laterality: N/A;   CARDIOVERSION N/A 11/29/2021   Procedure: CARDIOVERSION;  Surgeon: Laurey Morale, MD;  Location: [facility] ENDOSCOPY;  Service: Cardiovascular;  Laterality: N/A;   CARDIOVERSION N/A 02/27/2022   Procedure: CARDIOVERSION;  Surgeon: Little Ishikawa, MD;  Location: [facility] ENDOSCOPY;  Service: Cardiovascular;  Laterality: N/A;   CORONARY ARTERY BYPASS GRAFT N/A 11/12/2021   Procedure: CORONARY ARTERY BYPASS GRAFTING (CABG) x  3 USING LEFT INTERNAL MAMMARY ARTERY AND RIGHT GREATER SAPHENOUS VEIN;  Surgeon: Lovett Sox, MD;  Location: [facility] OR;   Service: Open Heart Surgery;  Laterality: N/A;   ELECTROPHYSIOLOGIC STUDY N/A 01/18/2015   Procedure: CARDIOVERSION;  Surgeon: Lamar Blinks, MD;  Location: [facility] ORS;  Service: Cardiovascular;  Laterality: N/A;   ENDOVEIN HARVEST OF GREATER SAPHENOUS VEIN  11/12/2021   Procedure: ENDOVEIN HARVEST OF GREATER SAPHENOUS VEIN;  Surgeon: Lovett Sox, MD;  Location: [facility] OR;  Service: Open Heart Surgery;;   EYE SURGERY Bilateral 2022   FEMORAL-POPLITEAL BYPASS GRAFT Right 03/19/2023   Procedure: RIGHT COMMON FEMORAL-BELOW KNEE POPLITEAL ARTERY BYPASS;  Surgeon: Leonie Heinz, MD;  Location: [facility] OR;  Service: Vascular;  Laterality: Right;   FISTULA SUPERFICIALIZATION Right 07/10/2022   Procedure: RIGHT ARM ARTERIOVENOUS FISTULA SUPERFICIALIZATION;  Surgeon: Maeola Harman, MD;  Location: [facility] OR;  Service: Vascular;  Laterality: Right;   IR FLUORO GUIDE CV LINE RIGHT  11/28/2021   IR US GUIDE VASC ACCESS RIGHT  11/28/2021   LEFT HEART CATH AND CORONARY ANGIOGRAPHY N/A 10/24/2021   Procedure: LEFT HEART CATH AND CORONARY ANGIOGRAPHY;  Surgeon: Eldridge Dace,  Donnie Coffin, MD;  Location: [facility] INVASIVE CV LAB;  Service: Cardiovascular;  Laterality: N/A;   MAZE N/A 11/12/2021   Procedure: MAZE;  Surgeon: Lovett Sox, MD;  Location: [facility] OR;  Service: Open Heart Surgery;  Laterality: N/A;   PERIPHERAL VASCULAR ATHERECTOMY  12/29/2022   Procedure: PERIPHERAL VASCULAR ATHERECTOMY;  Surgeon: Maeola Harman, MD;  Location[facility] INVASIVE CV LAB;  Service: Cardiovascular;;   PERIPHERAL VASCULAR INTERVENTION  12/29/2022   Procedure: PERIPHERAL VASCULAR INTERVENTION;  Surgeon: Maeola Harman, MD;  Location: [facility] INVASIVE CV LAB;  Service: Cardiovascular;;  Left SFA, Bilateral illiac   PERIPHERAL VASCULAR INTERVENTION Right 03/06/2023   Procedure: PERIPHERAL VASCULAR INTERVENTION;  Surgeon: Leonie Oddie, MD;  Location: [facility] INVASIVE CV LAB;  Service: Cardiovascular;  Laterality: Right;  SFA    STUMP REVISION Left 02/10/2023   Procedure: LEFT BELOW KNEE AMPUTATION REVISION;  Surgeon: Chuck Hint, MD;  Location: [facility] OR;  Service: Vascular;  Laterality: Left;   TEE WITHOUT CARDIOVERSION N/A 11/12/2021   Procedure: TRANSESOPHAGEAL ECHOCARDIOGRAM (TEE);  Surgeon: Lovett Sox, MD;  Location: [facility] OR;  Service: Open Heart Surgery;  Laterality: N/A;   TEE WITHOUT CARDIOVERSION N/A 11/21/2021   Procedure: TRANSESOPHAGEAL ECHOCARDIOGRAM (TEE);  Surgeon: Laurey Morale, MD;  Location: [facility] ENDOSCOPY;  Service: Cardiovascular;  Laterality: N/A;    Allergies  Allergen Reactions   Tape     Plastic tape -needs paper tape    Current Outpatient Medications  Medication Sig Dispense Refill   Accu-Chek Softclix Lancets lancets 1 each by Other route daily.     albuterol (VENTOLIN HFA) 108 (90 Base) MCG/ACT inhaler Inhale 2 puffs into the lungs every 6 (six) hours as needed for wheezing or shortness of breath.     amiodarone (PACERONE) 200 MG tablet TAKE 1 TABLET TWICE DAILY 180 tablet 3   apixaban (ELIQUIS) 5 MG TABS tablet Take 1 tablet (5 mg total) by mouth 2 (two) times daily.     atorvastatin (LIPITOR) 80 MG tablet Take 1 tablet (80 mg total) by mouth daily. 90 tablet 2   B Complex-C-Folic Acid (DIALYVITE 800) 0.8 MG TABS Take 0.8 mg by mouth daily.     Blood Glucose Monitoring Suppl (ACCU-CHEK GUIDE ME) w/Device KIT 1 Device by Other route 3 (three) times daily.     collagenase (SANTYL) 250 UNIT/GM ointment Apply 1 Application topically daily. 90 each 1   Dulaglutide (TRULICITY) 3 MG/0.5ML SOPN Inject 3 mg into the skin every Wednesday.     fluticasone furoate-vilanterol (BREO ELLIPTA) 200-25 MCG/ACT AEPB Inhale 1 puff into the lungs daily.  3   glucose blood test strip 1 each by Other route 3 (three) times daily.     insulin glargine (LANTUS) 100 UNIT/ML injection Inject 0.2 mLs (20 Units total) into the skin 2 (two) times daily.     Insulin Pen Needle 32G X 4 MM MISC Use to inject  Levemir 2 (two) times daily. 100 each 1   midodrine (PROAMATINE) 5 MG tablet TAKE 1 TABLET EVERY DAY 90 tablet 2   torsemide (DEMADEX) 20 MG tablet TAKE 1 TABLET EVERY DAY 180 tablet 2   No current facility-administered medications for this visit.    Family History  Problem Relation Age of Onset   CAD Brother    Drug abuse Brother    Lung cancer Father    CAD Mother     Social History   Socioeconomic History   Marital status: Married    Spouse name: Not on  file   Number of children: Not on file   Years of education: Not on file   Highest education level: Not on file  Occupational History   Not on file  Tobacco Use   Smoking status: Former    Current packs/day: 0.00    Types: Cigarettes    Quit date: 2007    Years since quitting: 18.1   Smokeless tobacco: Never  Vaping Use   Vaping status: Never Used  Substance and Sexual Activity   Alcohol use: No   Drug use: Not Currently    Frequency: 1.0 times per week    Types: Marijuana   Sexual activity: Not on file  Other Topics Concern   Not on file  Social History Narrative   Not on file   Social Drivers of Health   Financial Resource Strain: Not on file  Food Insecurity: No Food Insecurity (02/05/2023)   Hunger Vital Sign    Worried About Running Out of Food in the Last Year: Never true    Ran Out of Food in the Last Year: Never true  Transportation Needs: No Transportation Needs (02/05/2023)   PRAPARE - Administrator, Civil Service (Medical): No    Lack of Transportation (Non-Medical): No  Physical Activity: Not on file  Stress: Not on file  Social Connections: Not on file  Intimate Partner Violence: Not At Risk (02/05/2023)   Humiliation, Afraid, Rape, and Kick questionnaire    Fear of Current or Ex-Partner: No    Emotionally Abused: No    Physically Abused: No    Sexually Abused: No    PHYSICAL EXAMINATION:  Today's Vitals   08/18/23 0917  BP: (!) 116/54  Pulse: 68  Resp: 20  Temp: 98.7  F (37.1 C)  SpO2: 96%  Weight: 227 lb 1.2 oz (103 kg)  Height: 5\' 10"  (1.778 m)    Body mass index is 32.58 kg/m.  Chronically ill man in no distress In a wheelchair Left BKA stump is healed Dorsum of right foot is much improved with granulation tissue at the base.  All necrotic tendon has been debrided.  Good bleeding.  Ulcer at the lateral foot appears about the same.  Ulcer at the medial foot appears improved with eschar No pedal pulses on the right  Non-Invasive Vascular Imaging:   None new  ASSESSMENT/PLAN:: 68 y.o. male here for follow up for PAD with hx of left BKA on 01/26/2023 and revision of left BKA and evacuation of hematoma with placement of biologic dressing and vac placement 02/10/2023.  On 03/19/2023 he underwent right CFA to BK popliteal artery bypass with 6mm PTFE in the subfascial tunnel for RLE ulceration.  Recommend:  Abstinence from all tobacco products. Blood glucose control with goal A1c < 7%. Blood pressure control with goal blood pressure < 140/90 mmHg. Lipid reduction therapy with goal LDL-C <100 mg/dL  Aspirin 81mg  PO QD.  Atorvastatin 40-80mg  PO QD (or other "high intensity" statin therapy).  Continue local care to right foot.  Appreciate plastic surgery assistance. Left below knee amputation nearly healed.  Will see back in 3 months for wound check.  Rande Brunt. Lenell Antu, MD [facility] Phone Number: [phone] 08/18/2023 11:55 AM

## 2023-08-18 ENCOUNTER — Encounter: Payer: Self-pay | Admitting: Vascular Surgery

## 2023-08-18 ENCOUNTER — Encounter: Payer: Self-pay | Admitting: Surgical

## 2023-08-18 ENCOUNTER — Telehealth: Payer: Self-pay | Admitting: Cardiology

## 2023-08-18 ENCOUNTER — Ambulatory Visit (INDEPENDENT_AMBULATORY_CARE_PROVIDER_SITE_OTHER): Payer: Medicare HMO | Admitting: Surgical

## 2023-08-18 ENCOUNTER — Ambulatory Visit (INDEPENDENT_AMBULATORY_CARE_PROVIDER_SITE_OTHER): Payer: Medicare HMO | Admitting: Vascular Surgery

## 2023-08-18 VITALS — BP 116/54 | HR 68 | Temp 98.7°F | Resp 20 | Ht 70.0 in | Wt 227.1 lb

## 2023-08-18 VITALS — BP 125/72 | HR 77

## 2023-08-18 DIAGNOSIS — Z89512 Acquired absence of left leg below knee: Secondary | ICD-10-CM | POA: Diagnosis not present

## 2023-08-18 DIAGNOSIS — I70435 Atherosclerosis of autologous vein bypass graft(s) of the right leg with ulceration of other part of foot: Secondary | ICD-10-CM | POA: Diagnosis not present

## 2023-08-18 DIAGNOSIS — I739 Peripheral vascular disease, unspecified: Secondary | ICD-10-CM

## 2023-08-18 DIAGNOSIS — S81801A Unspecified open wound, right lower leg, initial encounter: Secondary | ICD-10-CM

## 2023-08-18 NOTE — Telephone Encounter (Signed)
 Caller Molli Hazard) is following-up on the status of patient's clearance.

## 2023-08-18 NOTE — Progress Notes (Signed)
 Patient ID: [name] [name], male    DOB: [birth year], 68 y.o.   MRN: [id number]  Chief Complaint  Patient presents with   Pre-op Exam      ICD-10-CM   1. Wound of right leg, initial encounter  S81.801A       History of Present Illness: [name] [name] is a 68 y.o.  male  with a history of right foot wound.  He presents for preoperative evaluation for upcoming procedure, right foot debridement and possible application of myriad wound matrix, scheduled for 09/01/2023 with Dr. Ulice Bold.  Mr. Frei reports he has tolerated anesthesia in the past without any issues. He is currently on Eliquis When asked about DVTs, he does not specifically report a history of DVT, but does report that he has had stents placed in his right lower extremity. No known family history of DVT per patient.  No family or personal history of bleeding or clotting disorders.  Patient reports that he does have shortness of breath with exertion which is normal for him and has not changed over the last few months.  He reports that he has sleep apnea, uses CPAP machine and this works well for him.  He denies any recent chest pain or exacerbations to his shortness of breath.  He reports that he is prescribed albuterol which he uses about once per month.   Summary of Previous Visit: 68 year old male with complicated history of vascular disease resulting in below-knee amputation of the left and need for revascularization on the right.  He has 3 wounds on the right foot which have failed to heal.  The primary wound is on the right dorsum of the foot.  PMH Significant for: Restrictive airway disease, CABG x 3, CHF, diabetes mellitus, GERD, OSA on CPAP, A-fib, shortness of breath on exertion, end-stage renal disease on dialysis  Cardiac clearance was sent to patient's cardiologist group.  Pharmacy recommendations were provided, he can hold Eliquis 2 days prior to procedure, bridging with Lovenox not needed.  Reports  she has overall been feeling fine lately, no recent changes to his health.  He feels prepared for surgery and understands the risks.  He was able to go through the consent form today with his wife and does not have any specific questions.  Past Medical History: Allergies: Allergies  Allergen Reactions   Tape     Plastic tape -needs paper tape    Current Medications:  Current Outpatient Medications:    Accu-Chek Softclix Lancets lancets, 1 each by Other route daily., Disp: , Rfl:    albuterol (VENTOLIN HFA) 108 (90 Base) MCG/ACT inhaler, Inhale 2 puffs into the lungs every 6 (six) hours as needed for wheezing or shortness of breath., Disp: , Rfl:    amiodarone (PACERONE) 200 MG tablet, TAKE 1 TABLET TWICE DAILY, Disp: 180 tablet, Rfl: 3   apixaban (ELIQUIS) 5 MG TABS tablet, Take 1 tablet (5 mg total) by mouth 2 (two) times daily., Disp: , Rfl:    atorvastatin (LIPITOR) 80 MG tablet, Take 1 tablet (80 mg total) by mouth daily., Disp: 90 tablet, Rfl: 2   B Complex-C-Folic Acid (DIALYVITE 800) 0.8 MG TABS, Take 0.8 mg by mouth daily., Disp: , Rfl:    Blood Glucose Monitoring Suppl (ACCU-CHEK GUIDE ME) w/Device KIT, 1 Device by Other route 3 (three) times daily., Disp: , Rfl:    collagenase (SANTYL) 250 UNIT/GM ointment, Apply 1 Application topically daily., Disp: 90 each, Rfl: 1   Dulaglutide (TRULICITY)  3 MG/0.5ML SOPN, Inject 3 mg into the skin every Wednesday., Disp: , Rfl:    fluticasone furoate-vilanterol (BREO ELLIPTA) 200-25 MCG/ACT AEPB, Inhale 1 puff into the lungs daily., Disp: , Rfl: 3   glucose blood test strip, 1 each by Other route 3 (three) times daily., Disp: , Rfl:    insulin glargine (LANTUS) 100 UNIT/ML injection, Inject 0.2 mLs (20 Units total) into the skin 2 (two) times daily., Disp: , Rfl:    Insulin Pen Needle 32G X 4 MM MISC, Use to inject Levemir 2 (two) times daily., Disp: 100 each, Rfl: 1   midodrine (PROAMATINE) 5 MG tablet, TAKE 1 TABLET EVERY DAY, Disp: 90  tablet, Rfl: 2   torsemide (DEMADEX) 20 MG tablet, TAKE 1 TABLET EVERY DAY, Disp: 180 tablet, Rfl: 2  Past Medical Problems: Past Medical History:  Diagnosis Date   Acute kidney failure, unspecified (HCC) 12/05/2021   Anemia 04/17/2022   Blood transfusion   Atherosclerosis of native arteries of extremities with rest pain, right leg (HCC) 03/23/2023   Atherosclerotic heart disease    Benign essential HTN 07/30/2014   Bradycardia 01/25/2015   Cardiomyopathy, unspecified (HCC) 12/03/2021   CHF (congestive heart failure) (HCC)    Chronic anticoagulation 02/25/2018   Chronic diastolic CHF (congestive heart failure) (HCC) 07/15/2021   Chronic systolic CHF (congestive heart failure), NYHA class 3 (HCC) 08/30/2014   Overview:  Global ef 30%   CKD (chronic kidney disease)    Class 3 severe obesity in adult (HCC) 04/28/2017   Coronary artery disease    Critical limb ischemia of right lower extremity (HCC) 03/19/2023   Diabetes mellitus without complication (HCC)    Diabetic neuropathy (HCC) 07/28/2014   Dysrhythmia    Erectile dysfunction    GERD (gastroesophageal reflux disease)    High risk medication use 05/26/2018   Hypertensive heart disease with heart failure (HCC) 07/30/2014   Leukocytosis 11/26/2021   Limb ischemia 01/26/2023   Lumbago    LV dysfunction 04/28/2017   Mixed hyperlipidemia 07/28/2014   OSA (obstructive sleep apnea)    CPAP   PAD (peripheral artery disease) (HCC) 01/26/2023   Paroxysmal atrial fibrillation (HCC) 07/28/2014   Peripheral arterial disease (HCC)    Postop check 01/06/2022   S/P CABG x 3 11/12/2021   Sinus node dysfunction (HCC) 04/21/2016   Testicular hypofunction    Uncontrolled type 2 diabetes mellitus with microalbuminuric diabetic nephropathy 07/28/2014    Past Surgical History: Past Surgical History:  Procedure Laterality Date   ABDOMINAL AORTOGRAM W/LOWER EXTREMITY N/A 12/29/2022   Procedure: ABDOMINAL AORTOGRAM W/LOWER EXTREMITY;   Surgeon: Maeola Harman, MD;  Location: [facility] INVASIVE CV LAB;  Service: Cardiovascular;  Laterality: N/A;   ABDOMINAL AORTOGRAM W/LOWER EXTREMITY N/A 01/22/2023   Procedure: ABDOMINAL AORTOGRAM W/LOWER EXTREMITY;  Surgeon: [name], MD;  Location: [facility] INVASIVE CV LAB;  Service: Cardiovascular;  Laterality: N/A;   ABDOMINAL AORTOGRAM W/LOWER EXTREMITY N/A 03/06/2023   Procedure: ABDOMINAL AORTOGRAM W/LOWER EXTREMITY;  Surgeon: Leonie Clever, MD;  Location: [facility] INVASIVE CV LAB;  Service: Cardiovascular;  Laterality: N/A;   AMPUTATION Left 01/26/2023   Procedure: LEFT AMPUTATION BELOW KNEE;  Surgeon: Leonie Sohum, MD;  Location[facility] OR;  Service: Vascular;  Laterality: Left;  with regional block   APPLICATION OF WOUND VAC Left 01/26/2023   Procedure: APPLICATION OF WOUND VAC, LEFT BELOW THE KNEE AMPUTATION STUMP;  Surgeon: Leonie Zymier, MD;  Location: [facility] OR;  Service: Vascular;  Laterality: Left;  With regional  block   APPLICATION OF WOUND VAC Left 02/10/2023   Procedure: APPLICATION OF WOUND VAC;  Surgeon: Chuck Hint, MD;  Location: [facility] OR;  Service: Vascular;  Laterality: Left;   AV FISTULA PLACEMENT Right 05/20/2022   Procedure: RIGHT ARM ARTERIOVENOUS (AV) FISTULA CREATION;  Surgeon: Larina Earthly, MD;  Location: [facility] OR;  Service: Vascular;  Laterality: Right;   BUBBLE STUDY  11/21/2021   Procedure: BUBBLE STUDY;  Surgeon: Laurey Morale, MD;  Location[facility] ENDOSCOPY;  Service: Cardiovascular;;   CARDIAC CATHETERIZATION     CARDIOVERSION N/A 11/21/2021   Procedure: CARDIOVERSION;  Surgeon: Laurey Morale, MD;  Location: [facility] ENDOSCOPY;  Service: Cardiovascular;  Laterality: N/A;   CARDIOVERSION N/A 11/25/2021   Procedure: CARDIOVERSION;  Surgeon: Laurey Morale, MD;  Location[facility] ENDOSCOPY;  Service: Cardiovascular;  Laterality: N/A;   CARDIOVERSION N/A 11/29/2021   Procedure: CARDIOVERSION;  Surgeon: Laurey Morale, MD;  Location: [facility] ENDOSCOPY;  Service:  Cardiovascular;  Laterality: N/A;   CARDIOVERSION N/A 02/27/2022   Procedure: CARDIOVERSION;  Surgeon: Little Ishikawa, MD;  Location: [facility] ENDOSCOPY;  Service: Cardiovascular;  Laterality: N/A;   CORONARY ARTERY BYPASS GRAFT N/A 11/12/2021   Procedure: CORONARY ARTERY BYPASS GRAFTING (CABG) x  3 USING LEFT INTERNAL MAMMARY ARTERY AND RIGHT GREATER SAPHENOUS VEIN;  Surgeon: Lovett Sox, MD;  Location: [facility] OR;  Service: Open Heart Surgery;  Laterality: N/A;   ELECTROPHYSIOLOGIC STUDY N/A 01/18/2015   Procedure: CARDIOVERSION;  Surgeon: Lamar Blinks, MD;  Location: [facility] ORS;  Service: Cardiovascular;  Laterality: N/A;   ENDOVEIN HARVEST OF GREATER SAPHENOUS VEIN  11/12/2021   Procedure: ENDOVEIN HARVEST OF GREATER SAPHENOUS VEIN;  Surgeon: Lovett Sox, MD;  Location: [facility] OR;  Service: Open Heart Surgery;;   EYE SURGERY Bilateral 2022   FEMORAL-POPLITEAL BYPASS GRAFT Right 03/19/2023   Procedure: RIGHT COMMON FEMORAL-BELOW KNEE POPLITEAL ARTERY BYPASS;  Surgeon: Leonie Andrik, MD;  Location: [facility] OR;  Service: Vascular;  Laterality: Right;   FISTULA SUPERFICIALIZATION Right 07/10/2022   Procedure: RIGHT ARM ARTERIOVENOUS FISTULA SUPERFICIALIZATION;  Surgeon: Maeola Harman, MD;  Location: [facility] OR;  Service: Vascular;  Laterality: Right;   IR FLUORO GUIDE CV LINE RIGHT  11/28/2021   IR US GUIDE VASC ACCESS RIGHT  11/28/2021   LEFT HEART CATH AND CORONARY ANGIOGRAPHY N/A 10/24/2021   Procedure: LEFT HEART CATH AND CORONARY ANGIOGRAPHY;  Surgeon: Corky Crafts, MD;  Location: [facility] INVASIVE CV LAB;  Service: Cardiovascular;  Laterality: N/A;   MAZE N/A 11/12/2021   Procedure: MAZE;  Surgeon: Lovett Sox, MD;  Location: [facility] OR;  Service: Open Heart Surgery;  Laterality: N/A;   PERIPHERAL VASCULAR ATHERECTOMY  12/29/2022   Procedure: PERIPHERAL VASCULAR ATHERECTOMY;  Surgeon: Maeola Harman, MD;  Location: [facility] INVASIVE CV LAB;  Service: Cardiovascular;;    PERIPHERAL VASCULAR INTERVENTION  12/29/2022   Procedure: PERIPHERAL VASCULAR INTERVENTION;  Surgeon: Maeola Harman, MD;  Location: [facility] INVASIVE CV LAB;  Service: Cardiovascular;;  Left SFA, Bilateral illiac   PERIPHERAL VASCULAR INTERVENTION Right 03/06/2023   Procedure: PERIPHERAL VASCULAR INTERVENTION;  Surgeon: Leonie Mesiah, MD;  Location: [facility] INVASIVE CV LAB;  Service: Cardiovascular;  Laterality: Right;  SFA   STUMP REVISION Left 02/10/2023   Procedure: LEFT BELOW KNEE AMPUTATION REVISION;  Surgeon: Chuck Hint, MD;  Location: [facility] OR;  Service: Vascular;  Laterality: Left;   TEE WITHOUT CARDIOVERSION N/A 11/12/2021   Procedure: TRANSESOPHAGEAL ECHOCARDIOGRAM (TEE);  Surgeon: Lovett Sox, MD;  Location: [facility] OR;  Service: Open Heart Surgery;  Laterality: N/A;   TEE WITHOUT CARDIOVERSION N/A 11/21/2021   Procedure: TRANSESOPHAGEAL ECHOCARDIOGRAM (TEE);  Surgeon: Laurey Morale, MD;  Location: [facility] ENDOSCOPY;  Service: Cardiovascular;  Laterality: N/A;    Social History: Social History   Socioeconomic History   Marital status: Married    Spouse name: Not on file   Number of children: Not on file   Years of education: Not on file   Highest education level: Not on file  Occupational History   Not on file  Tobacco Use   Smoking status: Former    Current packs/day: 0.00    Types: Cigarettes    Quit date: 2007    Years since quitting: 18.1   Smokeless tobacco: Never  Vaping Use   Vaping status: Never Used  Substance and Sexual Activity   Alcohol use: No   Drug use: Not Currently    Frequency: 1.0 times per week    Types: Marijuana   Sexual activity: Not on file  Other Topics Concern   Not on file  Social History Narrative   Not on file   Social Drivers of Health   Financial Resource Strain: Not on file  Food Insecurity: No Food Insecurity (02/05/2023)   Hunger Vital Sign    Worried About Running Out of Food in the Last Year: Never true    Ran Out of  Food in the Last Year: Never true  Transportation Needs: No Transportation Needs (02/05/2023)   PRAPARE - Administrator, Civil Service (Medical): No    Lack of Transportation (Non-Medical): No  Physical Activity: Not on file  Stress: Not on file  Social Connections: Not on file  Intimate Partner Violence: Not At Risk (02/05/2023)   Humiliation, Afraid, Rape, and Kick questionnaire    Fear of Current or Ex-Partner: No    Emotionally Abused: No    Physically Abused: No    Sexually Abused: No    Family History: Family History  Problem Relation Age of Onset   CAD Brother    Drug abuse Brother    Lung cancer Father    CAD Mother     Review of Systems: Review of Systems  Constitutional: Negative.   Respiratory:  Positive for shortness of breath (Chronic with exertion).   Cardiovascular:  Negative for chest pain and palpitations.  Gastrointestinal: Negative.   Neurological:  Positive for sensory change and weakness.    Physical Exam: Vital Signs BP 125/72 (BP Location: Left Arm, Patient Position: Sitting, Cuff Size: Normal)   Pulse 77   SpO2 97%   Physical Exam Constitutional:      General: Not in acute distress.    Appearance: Normal appearance. Not ill-appearing.  HENT:     Head: Normocephalic and atraumatic.  Eyes:     Pupils: Pupils are equal, round Neck:     Musculoskeletal: Normal range of motion.  Cardiovascular:     Rate and Rhythm: Normal rate    Pulses: Normal pulses.  Pulmonary:     Effort: Pulmonary effort is normal. No respiratory distress.  Abdominal:     General: Abdomen is flat. There is no distension.  Musculoskeletal: Normal range of motion.  Left leg amputation noted.  Right lower extremity with bulky dressing in place. Skin:    General: Skin is warm and dry.     Findings: No erythema or rash.  Neurological:     General: No focal deficit present.     Mental Status: Alert  and oriented to person, place, and time. Mental status is at  baseline.     Motor: No weakness.  Psychiatric:        Mood and Affect: Mood normal.        Behavior: Behavior normal.    Assessment/Plan: The patient is scheduled for debridement of right foot wound with possible application of myriad wound matrix with Dr. Ladona Ridgel.  Risks, benefits, and alternatives of procedure discussed, questions answered and consent obtained.    Smoking Status: Quit many years ago, does occasionally smoke marijuana; Counseling Given?  Discussed avoiding smoking marijuana prior to surgery.  Caprini Score: 10; Risk Factors include: Age, wheelchair-bound, BMI > 25, history of diabetes mellitus requiring insulin and length of planned surgery. Recommendation for mechanical and pharmacological prophylaxis. Encourage early ambulation.  Patient to hold Eliquis 2 days prior to surgery, restart following a.m.  Pictures obtained: @consult   Post-op Rx sent to pharmacy:  Tramadol  Patient was provided with the General Surgical Risk consent document and Pain Medication Agreement prior to their appointment.  They had adequate time to read through the risk consent documents and Pain Medication Agreement. We also discussed them in person together during this preop appointment. All of their questions were answered to their satisfaction.  Recommended calling if they have any further questions.  Risk consent form and Pain Medication Agreement to be scanned into patient's chart.  Discussed cardiology recommendations with patient and Dr. Ladona Ridgel, will recommend holding Eliquis 2 days prior to surgery.  Appreciate cardiology recommendations.  Of note I also called cardiology to check on status of preoperative cardiac clearance appointment, reviewed EMR notes, patient has not yet been scheduled for the phone call appointment.  Appreciate their help.  Electronically signed by: Kermit Balo Kaylah Chiasson, PA-C 08/18/2023 3:10 PM

## 2023-08-18 NOTE — Telephone Encounter (Signed)
 I will reach out to the pt with a tele preop appt. This request was never routed back to the preop call back team to reach out to the pt with an appt.    I called pt and he has been scheduled in office appt ok per preop APP Robin Searing, NP as no open tele appt's. Appt with Wallis Bamberg, NP 08/25/23 @ 10:05 in [place] office.    Wilson, Jasmin B9 minutes ago (4:00 PM)   JW Caller Molli Hazard) is following-up on the status of patient's clearance.      Note   Anastasio Auerbach [facility] [phone]  Annetta Maw

## 2023-08-19 DIAGNOSIS — Z4781 Encounter for orthopedic aftercare following surgical amputation: Secondary | ICD-10-CM | POA: Diagnosis not present

## 2023-08-19 DIAGNOSIS — R0600 Dyspnea, unspecified: Secondary | ICD-10-CM | POA: Diagnosis not present

## 2023-08-19 DIAGNOSIS — J4540 Moderate persistent asthma, uncomplicated: Secondary | ICD-10-CM | POA: Diagnosis not present

## 2023-08-19 DIAGNOSIS — Z89512 Acquired absence of left leg below knee: Secondary | ICD-10-CM | POA: Diagnosis not present

## 2023-08-19 DIAGNOSIS — E1151 Type 2 diabetes mellitus with diabetic peripheral angiopathy without gangrene: Secondary | ICD-10-CM | POA: Diagnosis not present

## 2023-08-19 DIAGNOSIS — E1140 Type 2 diabetes mellitus with diabetic neuropathy, unspecified: Secondary | ICD-10-CM | POA: Diagnosis not present

## 2023-08-19 DIAGNOSIS — L89610 Pressure ulcer of right heel, unstageable: Secondary | ICD-10-CM | POA: Diagnosis not present

## 2023-08-19 DIAGNOSIS — E11621 Type 2 diabetes mellitus with foot ulcer: Secondary | ICD-10-CM | POA: Diagnosis not present

## 2023-08-19 DIAGNOSIS — L97519 Non-pressure chronic ulcer of other part of right foot with unspecified severity: Secondary | ICD-10-CM | POA: Diagnosis not present

## 2023-08-19 DIAGNOSIS — L89510 Pressure ulcer of right ankle, unstageable: Secondary | ICD-10-CM | POA: Diagnosis not present

## 2023-08-19 DIAGNOSIS — I70223 Atherosclerosis of native arteries of extremities with rest pain, bilateral legs: Secondary | ICD-10-CM | POA: Diagnosis not present

## 2023-08-19 DIAGNOSIS — G4733 Obstructive sleep apnea (adult) (pediatric): Secondary | ICD-10-CM | POA: Diagnosis not present

## 2023-08-20 ENCOUNTER — Encounter (HOSPITAL_BASED_OUTPATIENT_CLINIC_OR_DEPARTMENT_OTHER): Payer: Medicare HMO | Admitting: Internal Medicine

## 2023-08-20 DIAGNOSIS — L97518 Non-pressure chronic ulcer of other part of right foot with other specified severity: Secondary | ICD-10-CM | POA: Diagnosis not present

## 2023-08-20 DIAGNOSIS — Z89512 Acquired absence of left leg below knee: Secondary | ICD-10-CM | POA: Diagnosis not present

## 2023-08-20 DIAGNOSIS — E11621 Type 2 diabetes mellitus with foot ulcer: Secondary | ICD-10-CM | POA: Diagnosis not present

## 2023-08-20 DIAGNOSIS — L97822 Non-pressure chronic ulcer of other part of left lower leg with fat layer exposed: Secondary | ICD-10-CM | POA: Diagnosis not present

## 2023-08-20 DIAGNOSIS — I509 Heart failure, unspecified: Secondary | ICD-10-CM | POA: Diagnosis not present

## 2023-08-20 DIAGNOSIS — E1151 Type 2 diabetes mellitus with diabetic peripheral angiopathy without gangrene: Secondary | ICD-10-CM | POA: Diagnosis not present

## 2023-08-20 DIAGNOSIS — T8189XA Other complications of procedures, not elsewhere classified, initial encounter: Secondary | ICD-10-CM | POA: Diagnosis not present

## 2023-08-20 DIAGNOSIS — G4730 Sleep apnea, unspecified: Secondary | ICD-10-CM | POA: Diagnosis not present

## 2023-08-20 DIAGNOSIS — I110 Hypertensive heart disease with heart failure: Secondary | ICD-10-CM | POA: Diagnosis not present

## 2023-08-21 DIAGNOSIS — Z992 Dependence on renal dialysis: Secondary | ICD-10-CM | POA: Diagnosis not present

## 2023-08-21 DIAGNOSIS — N179 Acute kidney failure, unspecified: Secondary | ICD-10-CM | POA: Diagnosis not present

## 2023-08-21 DIAGNOSIS — N2581 Secondary hyperparathyroidism of renal origin: Secondary | ICD-10-CM | POA: Diagnosis not present

## 2023-08-21 DIAGNOSIS — N186 End stage renal disease: Secondary | ICD-10-CM | POA: Diagnosis not present

## 2023-08-24 ENCOUNTER — Encounter: Payer: Self-pay | Admitting: Cardiology

## 2023-08-24 DIAGNOSIS — N186 End stage renal disease: Secondary | ICD-10-CM | POA: Diagnosis not present

## 2023-08-24 DIAGNOSIS — N2581 Secondary hyperparathyroidism of renal origin: Secondary | ICD-10-CM | POA: Diagnosis not present

## 2023-08-24 DIAGNOSIS — Z992 Dependence on renal dialysis: Secondary | ICD-10-CM | POA: Diagnosis not present

## 2023-08-24 NOTE — Progress Notes (Deleted)
 Cardiology Office Note:  .   Date:  08/24/2023  ID:  [name] [name], DOB 16-Dec-1955, MRN [id number] PCP: [name], PA-C  [facility] Providers Cardiologist:  Norman Herrlich, MD Electrophysiologist:  Will Jorja Loa, MD { Click to update primary MD,subspecialty MD or APP then REFRESH:1}   History of Present Illness: .   [name] [name] is a 68 y.o. male with a past medical history of CAD s/p CABG x 3, hypertension, cardiomyopathy, PAD, atypical atrial flutter on amiodarone therapy s/p MAZE, ESRD on HD, dyslipidemia, PFO.  11/21/2021 echo TEE-DCCV-EF 55 to 60%, PFO, mild MR, mild calcification of the aortic valve 11/12/2021 CABG x 3 LIMA to LAD, SVG to OM1, SVG to posterior descending; left atrial maze procedure 10/24/2021 left heart cath eccentric, severely calcified left main disease extending into the ostial LAD >> TCTS for CABG  Most recently evaluated by Dr. Dulce Sellar on 06/23/2023, he was maintaining sinus rhythm, no changes were made to his plan of care and he was advised to follow-up in 6 months  Can hold Eliquis for 2 days, does not need to bridge with Lovenox, right foot debridement, 09/01/2023 medical and medicine  ROS: ROS   Studies Reviewed: .        Cardiac Studies & Procedures   ______________________________________________________________________________________________ CARDIAC CATHETERIZATION  CARDIAC CATHETERIZATION 10/24/2021  Narrative   Ost LM to Mid LM lesion is 50% stenosed.   Dist LM lesion is 75% stenosed.   Ost LAD to Prox LAD lesion is 50% stenosed.  Ectatic segment with eccentric calcium.   Ost Cx to Prox Cx lesion is 70% stenosed.   Mid LAD lesion is 50% stenosed.   Prox RCA to Mid RCA lesion is 70% stenosed.   LV end diastolic pressure is normal.   There is no aortic valve stenosis.  Eccentric, severely calcified left main disease extending into the ostial LAD.  There is also ostial disease and a relatively small circumflex vessel.   Diffuse, calcific disease in the mid RCA today.  Patient was initially scheduled for A-fib ablation.  Plan now for cardiac surgery consult with consideration of CABG, maze and left atrial appendage clip.  Findings Coronary Findings Diagnostic  Dominance: Right  Left Main Ost LM to Mid LM lesion is 50% stenosed. The lesion is eccentric. Dist LM lesion is 75% stenosed. The lesion is eccentric.  Left Anterior Descending Ost LAD to Prox LAD lesion is 50% stenosed. The lesion is severely calcified. Mid LAD lesion is 50% stenosed.  Left Circumflex Ost Cx to Prox Cx lesion is 70% stenosed.  Right Coronary Artery There is mild diffuse disease throughout the vessel. Prox RCA to Mid RCA lesion is 70% stenosed. The lesion is eccentric. The lesion is severely calcified.  Intervention  No interventions have been documented.     ECHOCARDIOGRAM  ECHOCARDIOGRAM COMPLETE 11/13/2021  Narrative ECHOCARDIOGRAM REPORT    Patient Name:   [name] [name] Date of Exam: 11/13/2021 Medical Rec #:  [id number]    Height:       70.0 in Accession #:    [id number]   Weight:       295.6 lb Date of Birth:  03-27-1956   BSA:          2.464 m Patient Age:    65 years     BP:           126/62 mmHg Patient Gender: M            HR:  87 bpm. Exam Location:  Inpatient  Procedure: 2D Echo, Cardiac Doppler, Color Doppler and Intracardiac Opacification Agent  Indications:    CHF  History:        Patient has prior history of Echocardiogram examinations, most recent 07/30/2021. CHF, Arrythmias:Bradycardia; Risk Factors:Hypertension, Dyslipidemia and Diabetes. S/p CABG x 3.  Sonographer:    Neomia Dear RDCS Referring Phys: 32 LINDSAY NICOLE [facility]   Sonographer Comments: Patient is morbidly obese and Technically difficult study due to poor echo windows. Image acquisition challenging due to patient body habitus. IMPRESSIONS   1. Left ventricular ejection fraction, by estimation, is 55 to 60%. The  left ventricle has normal function. Left ventricular endocardial border not optimally defined to evaluate regional wall motion. Left ventricular diastolic parameters are consistent with Grade II diastolic dysfunction (pseudonormalization). 2. Peak RV-RA gradient 28 mmHg. Right ventricular systolic function was not well visualized. The right ventricular size is not well visualized. 3. The mitral valve was not well visualized. No evidence of mitral valve regurgitation. No evidence of mitral stenosis. 4. The aortic valve is tricuspid. There is mild calcification of the aortic valve. Aortic valve regurgitation is not visualized. Aortic valve sclerosis is present, with no evidence of aortic valve stenosis. 5. No significant pericardial effusion. 6. The heart is poorly visualized. Grossly, LV systolic function appears to be in the normal range.  FINDINGS Left Ventricle: Left ventricular ejection fraction, by estimation, is 55 to 60%. The left ventricle has normal function. Left ventricular endocardial border not optimally defined to evaluate regional wall motion. The left ventricular internal cavity size was normal in size. There is no left ventricular hypertrophy. Left ventricular diastolic parameters are consistent with Grade II diastolic dysfunction (pseudonormalization).  Right Ventricle: Peak RV-RA gradient 28 mmHg. The right ventricular size is not well visualized. Right vetricular wall thickness was not well visualized. Right ventricular systolic function was not well visualized.  Left Atrium: Left atrial size was not well visualized.  Right Atrium: Right atrial size was not well visualized.  Pericardium: There is no evidence of pericardial effusion.  Mitral Valve: The mitral valve was not well visualized. No evidence of mitral valve regurgitation. No evidence of mitral valve stenosis. MV peak gradient, 9.1 mmHg. The mean mitral valve gradient is 3.0 mmHg.  Tricuspid Valve: The tricuspid valve  is normal in structure. Tricuspid valve regurgitation is trivial.  Aortic Valve: The aortic valve is tricuspid. There is mild calcification of the aortic valve. Aortic valve regurgitation is not visualized. Aortic valve sclerosis is present, with no evidence of aortic valve stenosis. Aortic valve mean gradient measures 2.5 mmHg. Aortic valve peak gradient measures 4.8 mmHg. Aortic valve area, by VTI measures 6.12 cm.  Pulmonic Valve: The pulmonic valve was normal in structure. Pulmonic valve regurgitation is not visualized.  Aorta: The aortic root is normal in size and structure.  Venous: The inferior vena cava was not well visualized.  IAS/Shunts: The interatrial septum was not well visualized.   LEFT VENTRICLE PLAX 2D LVIDd:         5.10 cm   Diastology LVIDs:         4.00 cm   LV e' medial:    5.92 cm/s LV PW:         1.20 cm   LV E/e' medial:  27.2 LV IVS:        1.10 cm   LV e' lateral:   7.22 cm/s LVOT diam:     2.60 cm   LV E/e' lateral:  22.3 LV SV:         102 LV SV Index:   42 LVOT Area:     5.31 cm   RIGHT VENTRICLE RV S prime:     10.10 cm/s  LEFT ATRIUM         Index LA diam:    2.70 cm 1.10 cm/m AORTIC VALVE                    PULMONIC VALVE AV Area (Vmax):    5.36 cm     PV Vmax:       1.02 m/s AV Area (Vmean):   5.17 cm     PV Vmean:      67.500 cm/s AV Area (VTI):     6.12 cm     PV VTI:        0.144 m AV Vmax:           109.00 cm/s  PV Peak grad:  4.2 mmHg AV Vmean:          74.650 cm/s  PV Mean grad:  2.0 mmHg AV VTI:            0.168 m AV Peak Grad:      4.8 mmHg AV Mean Grad:      2.5 mmHg LVOT Vmax:         110.00 cm/s LVOT Vmean:        72.700 cm/s LVOT VTI:          0.193 m LVOT/AV VTI ratio: 1.15  AORTA Ao Root diam: 3.70 cm Ao Asc diam:  3.60 cm  MITRAL VALVE                TRICUSPID VALVE MV Area (PHT): 3.91 cm     TR Peak grad:   28.5 mmHg MV Area VTI:   2.44 cm     TR Vmax:        267.00 cm/s MV Peak grad:  9.1 mmHg MV Mean  grad:  3.0 mmHg     SHUNTS MV Vmax:       1.51 m/s     Systemic VTI:  0.19 m MV Vmean:      80.3 cm/s    Systemic Diam: 2.60 cm MV Decel Time: 194 msec MV E velocity: 161.00 cm/s MV A velocity: 52.60 cm/s MV E/A ratio:  3.06  Dalton McleanMD Electronically signed by Wilfred Lacy Signature Date/Time: 11/13/2021/3:42:40 PM    Final   TEE  ECHO TEE 11/21/2021  Narrative TRANSESOPHOGEAL ECHO REPORT    Patient Name:   [name] [name] Date of Exam: 11/21/2021 Medical Rec #:  [id number]    Height:       70.0 in Accession #:    [id number]   Weight:       257.6 lb Date of Birth:  1955-07-17   BSA:          2.324 m Patient Age:    42 years     BP:           97/60 mmHg Patient Gender: M            HR:           83 bpm. Exam Location:  Inpatient  Procedure: Transesophageal Echo, Color Doppler and Saline Contrast Bubble Study  Indications:     I48.91* Unspeicified atrial fibrillation  History:         Patient has prior history of Echocardiogram examinations, most recent 11/13/2021. CHF,  CAD, Arrythmias:Bradycardia and Atrial Fibrillation; Risk Factors:Dyslipidemia, Hypertension and Diabetes.  Sonographer:     Eulah Pont RDCS Referring Phys:  [id number] [facility] NICOLE [facility] Diagnosing Phys: Wilfred Lacy  PROCEDURE: After discussion of the risks and benefits of a TEE, an informed consent was obtained from the patient. The transesophogeal probe was passed without difficulty through the esophogus of the patient. Sedation performed by different physician. The patient was monitored while under deep sedation. Anesthestetic sedation was provided intravenously by Anesthesiology: 56.28mg  of Propofol. The patient's vital signs; including heart rate, blood pressure, and oxygen saturation; remained stable throughout the procedure. The patient developed no complications during the procedure. A successful direct current cardioversion was performed at 200 joules with 1 attempt.  IMPRESSIONS   1.  Left ventricular ejection fraction, by estimation, is 55 to 60%. The left ventricle has normal function. The left ventricle has no regional wall motion abnormalities but there is septal bounce suggestive of prior cardiac surgery. 2. Right ventricular systolic function is mildly reduced. The right ventricular size is normal. Tricuspid regurgitation signal is inadequate for assessing PA pressure. 3. Left atrial size was mildly dilated. No left atrial/left atrial appendage thrombus was detected. 4. There is a PFO by color doppler and bubble study. 5. The mitral valve is normal in structure. Mild mitral valve regurgitation. No evidence of mitral stenosis. 6. The aortic valve is tricuspid. There is mild calcification of the aortic valve. Aortic valve regurgitation is not visualized. No aortic stenosis is present. 7. A small pericardial effusion is present, primarily towards the apex.  FINDINGS Left Ventricle: Left ventricular ejection fraction, by estimation, is 55 to 60%. The left ventricle has normal function. The left ventricle has no regional wall motion abnormalities. The left ventricular internal cavity size was normal in size. There is no left ventricular hypertrophy.  Right Ventricle: The right ventricular size is normal. No increase in right ventricular wall thickness. Right ventricular systolic function is mildly reduced. Tricuspid regurgitation signal is inadequate for assessing PA pressure.  Left Atrium: Left atrial size was mildly dilated. No left atrial/left atrial appendage thrombus was detected.  Right Atrium: Right atrial size was normal in size.  Pericardium: A small pericardial effusion is present.  Mitral Valve: The mitral valve is normal in structure. Mild mitral valve regurgitation. No evidence of mitral valve stenosis.  Tricuspid Valve: The tricuspid valve is normal in structure. Tricuspid valve regurgitation is trivial.  Aortic Valve: The aortic valve is tricuspid. There is  mild calcification of the aortic valve. Aortic valve regurgitation is not visualized. No aortic stenosis is present.  Pulmonic Valve: The pulmonic valve was normal in structure. Pulmonic valve regurgitation is not visualized.  Aorta: The aortic root is normal in size and structure.  IAS/Shunts: There is a PFO by color doppler and bubble study. Agitated saline contrast was given intravenously to evaluate for intracardiac shunting.  Dalton Mattel Electronically signed by Wilfred Lacy Signature Date/Time: 11/21/2021/3:32:50 PM    Final  MONITORS  LONG TERM MONITOR-LIVE TELEMETRY (3-14 DAYS) 02/17/2022  Narrative Patch Wear Time:  12 days and 10 hours (2023-07-31T11:42:52-0400 to 2023-08-12T22:32:43-0400)  Atrial Flutter occurred continuously (100% burden), ranging from 62-138 bpm (avg of 96 bpm).  There were 6 triggered events all atrial flutter with a rapid ventricular rate.  Isolated VEs were rare (<1.0%), VE Couplets were rare (<1.0%), and no VE Triplets were present.  Previously notified: MD notification criteria for First Documentation of Atrial Flutter met - notified Dema Severin RN on 20 Jan 2022  at 2:16 PM ET (RP).       ______________________________________________________________________________________________      Risk Assessment/Calculations:    CHA2DS2-VASc Score = 5  {Confirm score is correct.  If not, click here to update score.  REFRESH note.  :1} This indicates a 7.2% annual risk of stroke. The patient's score is based upon: CHF History: 1 HTN History: 1 Diabetes History: 1 Stroke History: 0 Vascular Disease History: 1 Age Score: 1 Gender Score: 0   {This patient has a significant risk of stroke if diagnosed with atrial fibrillation.  Please consider VKA or DOAC agent for anticoagulation if the bleeding risk is acceptable.   You can also use the SmartPhrase .[email or web address] for documentation.   :[id number]} No BP recorded.  {Refresh Note OR Click here to  enter BP  :1}***       Physical Exam:   VS:  There were no vitals taken for this visit.   Wt Readings from Last 3 Encounters:  08/18/23 227 lb 1.2 oz (103 kg)  07/09/23 250 lb (113.4 kg)  06/10/23 250 lb (113.4 kg)    GEN: Well nourished, well developed in no acute distress NECK: No JVD; No carotid bruits CARDIAC: ***RRR, no murmurs, rubs, gallops RESPIRATORY:  Clear to auscultation without rales, wheezing or rhonchi  ABDOMEN: Soft, non-tender, non-distended EXTREMITIES:  No edema; No deformity   ASSESSMENT AND PLAN: .   CAD - s/p CABG x 3 2023 Atrial fibrillation s/p MAZE -  ERSD -     {Are you ordering a CV Procedure (e.g. stress test, cath, DCCV, TEE, etc)?   Press F2        :[id number]}  Dispo: ***  Signed, Flossie Dibble, NP

## 2023-08-25 ENCOUNTER — Ambulatory Visit: Payer: Medicare HMO | Attending: Cardiology | Admitting: Cardiology

## 2023-08-25 DIAGNOSIS — L89610 Pressure ulcer of right heel, unstageable: Secondary | ICD-10-CM | POA: Diagnosis not present

## 2023-08-25 DIAGNOSIS — Z4781 Encounter for orthopedic aftercare following surgical amputation: Secondary | ICD-10-CM | POA: Diagnosis not present

## 2023-08-25 DIAGNOSIS — Z79899 Other long term (current) drug therapy: Secondary | ICD-10-CM

## 2023-08-25 DIAGNOSIS — Z7901 Long term (current) use of anticoagulants: Secondary | ICD-10-CM

## 2023-08-25 DIAGNOSIS — L97519 Non-pressure chronic ulcer of other part of right foot with unspecified severity: Secondary | ICD-10-CM | POA: Diagnosis not present

## 2023-08-25 DIAGNOSIS — N186 End stage renal disease: Secondary | ICD-10-CM

## 2023-08-25 DIAGNOSIS — E1140 Type 2 diabetes mellitus with diabetic neuropathy, unspecified: Secondary | ICD-10-CM | POA: Diagnosis not present

## 2023-08-25 DIAGNOSIS — E1151 Type 2 diabetes mellitus with diabetic peripheral angiopathy without gangrene: Secondary | ICD-10-CM | POA: Diagnosis not present

## 2023-08-25 DIAGNOSIS — I2510 Atherosclerotic heart disease of native coronary artery without angina pectoris: Secondary | ICD-10-CM

## 2023-08-25 DIAGNOSIS — E11621 Type 2 diabetes mellitus with foot ulcer: Secondary | ICD-10-CM | POA: Diagnosis not present

## 2023-08-25 DIAGNOSIS — I70223 Atherosclerosis of native arteries of extremities with rest pain, bilateral legs: Secondary | ICD-10-CM | POA: Diagnosis not present

## 2023-08-25 DIAGNOSIS — I484 Atypical atrial flutter: Secondary | ICD-10-CM

## 2023-08-25 DIAGNOSIS — L89510 Pressure ulcer of right ankle, unstageable: Secondary | ICD-10-CM | POA: Diagnosis not present

## 2023-08-25 DIAGNOSIS — I110 Hypertensive heart disease with heart failure: Secondary | ICD-10-CM

## 2023-08-25 DIAGNOSIS — Z89512 Acquired absence of left leg below knee: Secondary | ICD-10-CM | POA: Diagnosis not present

## 2023-08-26 NOTE — Progress Notes (Signed)
 [name] [name] is a dialysis patient and will need to be moved to Main OR. Heather at Dr Lubertha Basque office made aware.

## 2023-08-28 DIAGNOSIS — N186 End stage renal disease: Secondary | ICD-10-CM | POA: Diagnosis not present

## 2023-08-28 DIAGNOSIS — Z992 Dependence on renal dialysis: Secondary | ICD-10-CM | POA: Diagnosis not present

## 2023-08-28 DIAGNOSIS — N2581 Secondary hyperparathyroidism of renal origin: Secondary | ICD-10-CM | POA: Diagnosis not present

## 2023-08-31 ENCOUNTER — Encounter (HOSPITAL_COMMUNITY): Payer: Self-pay | Admitting: Plastic Surgery

## 2023-08-31 ENCOUNTER — Other Ambulatory Visit: Payer: Self-pay

## 2023-08-31 DIAGNOSIS — N2581 Secondary hyperparathyroidism of renal origin: Secondary | ICD-10-CM | POA: Diagnosis not present

## 2023-08-31 DIAGNOSIS — Z992 Dependence on renal dialysis: Secondary | ICD-10-CM | POA: Diagnosis not present

## 2023-08-31 DIAGNOSIS — N186 End stage renal disease: Secondary | ICD-10-CM | POA: Diagnosis not present

## 2023-08-31 NOTE — Anesthesia Preprocedure Evaluation (Signed)
 Anesthesia Evaluation    Airway        Dental   Pulmonary former smoker          Cardiovascular hypertension,      Neuro/Psych    GI/Hepatic   Endo/Other  diabetes    Renal/GU      Musculoskeletal   Abdominal   Peds  Hematology   Anesthesia Other Findings   Reproductive/Obstetrics                             Anesthesia Physical Anesthesia Plan  ASA:   Anesthesia Plan:    Post-op Pain Management:    Induction:   PONV Risk Score and Plan:   Airway Management Planned:   Additional Equipment:   Intra-op Plan:   Post-operative Plan:   Informed Consent:   Plan Discussed with:   Anesthesia Plan Comments: (See PAT note written 08/31/2023 by Shonna Chock, PA-C.  )       Anesthesia Quick Evaluation

## 2023-08-31 NOTE — Pre-Procedure Instructions (Signed)
 -------------  SDW INSTRUCTIONS given:  Your procedure is scheduled on 3/11.  Report to [facility] Main Entrance "A" at 05:30 A.M., and check in at the Admitting office.  Any questions or running late day of surgery: call [phone]    Remember:  Do not eat after midnight the night before your surgery  You may drink clear liquids until 04:30 AM the morning of your surgery.   Clear liquids allowed are: Water, Non-Citrus Juices (without pulp), Carbonated Beverages, Clear Tea, Black Coffee Only, and Gatorade    Take these medicines the morning of surgery with A SIP OF WATER  albuterol PRN, amiodarone, atorvastatin, breo ellipta, midodrine   Follow surgeon's instructions regarding Eliquis.  As of today, STOP taking any Aspirin (unless otherwise instructed by your surgeon) Aleve, Naproxen, Ibuprofen, Motrin, Advil, Goody's, BC's, all herbal medications, fish oil, and all vitamins.  WHAT DO I DO ABOUT MY DIABETES MEDICATION?   Stop taking Trulicity 7 days prior to surgery. Last dose 2/26.  THE NIGHT BEFORE SURGERY, take 10 units (50%) of Lantus   THE MORNING OF SURGERY, take 10 units (50%) of Lantus   HOW TO MANAGE YOUR DIABETES BEFORE AND AFTER SURGERY  Why is it important to control my blood sugar before and after surgery? Improving blood sugar levels before and after surgery helps healing and can limit problems. A way of improving blood sugar control is eating a healthy diet by:  Eating less sugar and carbohydrates  Increasing activity/exercise  Talking with your doctor about reaching your blood sugar goals High blood sugars (greater than 180 mg/dL) can raise your risk of infections and slow your recovery, so you will need to focus on controlling your diabetes during the weeks before surgery. Make sure that the doctor who takes care of your diabetes knows about your planned surgery including the date and location.  How do I manage my blood sugar before surgery? Check your  blood sugar at least 4 times a day, starting 2 days before surgery, to make sure that the level is not too high or low.  Check your blood sugar the morning of your surgery when you wake up and every 2 hours until you get to the Short Stay unit.  If your blood sugar is less than 70 mg/dL, you will need to treat for low blood sugar: Do not take insulin. Treat a low blood sugar (less than 70 mg/dL) with  cup of clear juice (cranberry or apple), 4 glucose tablets, OR glucose gel. Recheck blood sugar in 15 minutes after treatment (to make sure it is greater than 70 mg/dL). If your blood sugar is not greater than 70 mg/dL on recheck, call [phone] for further instructions. Report your blood sugar to the short stay nurse when you get to Short Stay.  If you are admitted to the hospital after surgery: Your blood sugar will be checked by the staff and you will probably be given insulin after surgery (instead of oral diabetes medicines) to make sure you have good blood sugar levels. The goal for blood sugar control after surgery is 80-180 mg/dL.   Do NOT Smoke (Tobacco/Vaping) 24 hours prior to your procedure  If you use a CPAP at night, you may bring all equipment for your overnight stay.     You will be asked to remove any contacts, glasses, piercing's, hearing aid's, dentures/partials prior to surgery. Please bring cases for these items if needed.     Patients discharged the day of surgery will  not be allowed to drive home, and someone needs to stay with them for 24 hours.  SURGICAL WAITING ROOM VISITATION Patients may have no more than 2 support people in the waiting area - these visitors may rotate.   Pre-op nurse will coordinate an appropriate time for 1 ADULT support person, who may not rotate, to accompany patient in pre-op.  Children under the age of 52 must have an adult with them who is not the patient and must remain in the main waiting area with an adult.  If the patient needs to  stay at the hospital during part of their recovery, the visitor guidelines for inpatient rooms apply.  Please refer to the [facility] website for the visitor guidelines for any additional information.   Special instructions:   [facility]- Preparing For Surgery   Please follow these instructions carefully.   Shower the NIGHT BEFORE SURGERY and the MORNING OF SURGERY with DIAL Soap.   Pat yourself dry with a CLEAN TOWEL.  Wear CLEAN PAJAMAS to bed the night before surgery  Place CLEAN SHEETS on your bed the night of your first shower and DO NOT SLEEP WITH PETS.   Additional instructions for the day of surgery: DO NOT APPLY any lotions, deodorants, cologne, or perfumes.   Do not wear jewelry or makeup Do not wear nail polish, gel polish, artificial nails, or any other type of covering on natural nails (fingers and toes) Do not bring valuables to the hospital. [facility] is not responsible for valuables/personal belongings. Put on clean/comfortable clothes.  Please brush your teeth.  Ask your nurse before applying any prescription medications to the skin.

## 2023-08-31 NOTE — Progress Notes (Signed)
 PCP - [name]- PA-C Cardiologist - Dr. Norman Herrlich EP- Dr. Loman Brooklyn  PPM/ICD - denies   Chest x-ray - 02/05/23 EKG - 06/23/23 Stress Test - 08/11/14 ECHO - 11/21/21 Cardiac Cath - 10/24/21  CPAP - OSA+, nightly, pressure settings 12-13  Fasting Blood Sugar - 90-100 Checks Blood Sugar once/day  Hold Trulicity 1 week (Last dose 3/5)  Blood Thinner Instructions: Hold Eliquis 2 days. Pt states he took his last dose 3/9 around 0700. Surgeon's office made aware. Aspirin Instructions: n/a  ERAS Protcol - clears until 0430  COVID TEST- n/a  Anesthesia review: yes, cardiac hx  Patient verbally denies any shortness of breath, fever, cough and chest pain during phone call       Questions were answered. Patient verbalized understanding of instructions.

## 2023-08-31 NOTE — Progress Notes (Signed)
 Anesthesia Chart Review: [name] [name]  Case: [id number] Date/Time: 09/01/23 0715   Procedure: Right foot debridement and possible placement of myriad, 8.5 x 6 cm (Right)   Anesthesia type: Choice   Pre-op diagnosis: Wound of right leg, initial encounter   [facility] ROOM 10 / [facility]   Surgeons: Santiago Glad, MD       DISCUSSION: Patient is a 68 year old male scheduled for the above procedure. He was moved to [facility] Main OR from [facility] due to being a dialysis patient.   History includes former smoker (quit 06/23/05), CAD (s/p CABG: LIMA--LAD, SVG-OM1, SVG-PDA, LA MAZE Procedure/RF ablation 11/12/21; had post-operative left retroperitoneal hematoma, s/p aspiration/antibiotics, no growth on cultures; CKD->ESRD), chronic systolic CHF (in setting of afib with normalization of EF after 2016 DCCV; recurrent afib 05/09/21), atrial fibrillation/PAF (diagnosed ~ 07/2014; s/p DCCV 01/18/15, recurrent 05/09/21, s/p DCCV 11/2021 x4 & 02/27/22; recurrent 04/17/22, planned DCCV 04/24/22 but EKG showed NSR; aflutter 03/10/23), sinus node dysfunction/bradycardia, HTN, HLD, DM2 with neuropathy, PAD (laser arthrectomy/stents left SFA & Popliteal arteries 12/29/22; left BKA 01/26/23 with revision 02/10/23; right CFA-below knee popliteal bypass using PTFR 03/19/23), obesity, OSA (uses CPAP), ESRD (CKD progressed to ESRD post CABG, started CRRT 11/15/21-11/20/21, HD started 11/23/21, right IJ [facility] 11/28/21; RUE AVF revision 07/10/22).   Last cardiology visit with Dr. Dulce Sellar was on 06/23/23. CAD felt stable. On amiodarone for PAF. Was in ectopic atrial rhythm by EKG. He denied palpitations, SOB, edema, chest pain, syncope. Tolerating statin therapy and anticoagulation. On midodrine for BP support. Volume status managed by HD. Follow-up in 6 months planned.  Last EP follow-up with Dr. Elberta Fortis was on 01/20/23. Six month follow-up planned.   Cardiology gave permission to hold Eliquis for 2 days prior to surgery without need for Lovenox bridge.    A1c 6.0% on 07/10/23. He is on Lantus 20 units BID, Trulicity 3 mg weekly (Wednesdays). Last Trulicity dose reported as 08/26/23. Last Eliquis was reported as 08/30/23 around 7:00 AM. I called and spoke with Melissa at Dr. Lubertha Basque office regarding last Eliquis and Trulicity doses. Unclear of case urgency, but does not appear entirely elective since [name] [name] does have a right leg wound with need for debridement.  Eliquis will just be held right at 48 hours. Case is currently scheduled as Choice anesthesia--Trulicity will have been held for six days. Melissa will have Dr. Ladona Ridgel review for additional recommendations, if any. Otherwise anesthesia team will further evaluation and determine anesthesia plan on the day of surgery.    VS:  Wt Readings from Last 3 Encounters:  08/18/23 103 kg  07/09/23 113.4 kg  06/10/23 113.4 kg   BP Readings from Last 3 Encounters:  08/18/23 125/72  08/18/23 (!) 116/54  07/15/23 (!) 134/50   Pulse Readings from Last 3 Encounters:  08/18/23 77  08/18/23 68  07/15/23 67     PROVIDERS: [name], PA-C is PCP  Loman Brooklyn, MD is EP cardiologist Norman Herrlich, MD is cardiologist Marca Ancona, MD is HF cardiologist (consulted post CABG) Heath Lark, MD is vascular surgeon Marcellus Scott, MD is pulmonologist Donata Clay, Theron Arista, MD is CT surgeon Asencion Islam, DPM is podiatrist He is also followed by [facility]. HD @ [facility].   LABS: For day of surgery. As of 08/24/23, H/H 8.8/26.4. A1c 6.0% 07/10/23.      PFTs 11/08/21: FVC 2.44 (53%), post 2.45 (54%). FEV1 1.88 (55%), post 1.98 (58%). DLCO unc 17.96 (67%), cor 18.63 (70%).  IMAGES: 1V PCXR 02/05/23: FINDINGS: Minimal left lung base atelectasis/scarring. No consolidative changes. There is no pleural effusion or pneumothorax. Stable cardiomegaly. Median sternotomy wires and CABG vascular clips. Atherosclerotic calcification of the aorta. No acute  osseous pathology. IMPRESSION: 1. No active disease. 2. Stable cardiomegaly.     EKG:  EKG 06/23/23 ([facility]): Ectopic atrial rhythm Left axis deviation Right bundle branch block When compared with ECG of 10-Mar-2023 13:36, Sinus rhythm has replaced Atrial fibrillation QT has lengthened Confirmed by Norman Herrlich ([id number]) on 06/23/2023 1:05:14 PM  EKG 03/10/23: atypical atrial flutter Left axis deviation Right bundle branch block When compared with ECG of 29-Dec-2022 05:40, Atrial fibrillation has replaced Sinus rhythm Vent. rate has increased BY 36 BPM Confirmed by Norman Herrlich ([id number]) on 03/10/2023 2:36:18 PM   EKG 12/29/22: Sinus rhythm with 1st degree A-V block Left axis deviation Right bundle branch block Abnormal ECG When compared with ECG of 24-Apr-2022 09:08, PREVIOUS ECG IS PRESENT Confirmed by Orpah Cobb ([id number]) on 01/01/2023 11:09:07 AM     CV: Long term monitor 01/20/22-02/01/22: - Patch Wear Time:  12 days and 10 hours (2023-07-31T11:42:52-0400 to 2023-08-12T22:32:43-0400) - Atrial Flutter occurred continuously (100% burden), ranging from 62-138 bpm (avg of 96 bpm).  - There were 6 triggered events all atrial flutter with a rapid ventricular rate. - Isolated VEs were rare (<1.0%), VE Couplets were rare (<1.0%), and no VE Triplets were present.  -  Previously notified: MD notification criteria for First Documentation of Atrial Flutter met - notified Dema Severin RN on 20 Jan 2022 at 2:16 PM ET (RP).     TEE 11/21/21: IMPRESSIONS   1. Left ventricular ejection fraction, by estimation, is 55 to 60%. The  left ventricle has normal function. The left ventricle has no regional  wall motion abnormalities but there is septal bounce suggestive of prior  cardiac surgery.   2. Right ventricular systolic function is mildly reduced. The right  ventricular size is normal. Tricuspid regurgitation signal is inadequate  for assessing PA pressure.   3. Left atrial size  was mildly dilated. No left atrial/left atrial  appendage thrombus was detected.   4. There is a PFO by color doppler and bubble study.   5. The mitral valve is normal in structure. Mild mitral valve  regurgitation. No evidence of mitral stenosis.   6. The aortic valve is tricuspid. There is mild calcification of the  aortic valve. Aortic valve regurgitation is not visualized. No aortic  stenosis is present.   7. A small pericardial effusion is present, primarily towards the apex.      TTE 11/13/21: IMPRESSIONS   1. Left ventricular ejection fraction, by estimation, is 55 to 60%. The  left ventricle has normal function. Left ventricular endocardial border  not optimally defined to evaluate regional wall motion. Left ventricular  diastolic parameters are consistent  with Grade II diastolic dysfunction (pseudonormalization).   2. Peak RV-RA gradient 28 mmHg. Right ventricular systolic function was  not well visualized. The right ventricular size is not well visualized.   3. The mitral valve was not well visualized. No evidence of mitral valve  regurgitation. No evidence of mitral stenosis.   4. The aortic valve is tricuspid. There is mild calcification of the  aortic valve. Aortic valve regurgitation is not visualized. Aortic valve  sclerosis is present, with no evidence of aortic valve stenosis.   5. No significant pericardial effusion.   6. The heart is poorly visualized. Grossly, LV systolic function appears  to  be in the normal range.  - Comparison LVEF 50-55% 07/30/21; 60-65% 04/12/18; 30% 08/11/14.      US Carotid 11/08/21: Summary:  - Right Carotid: Velocities in the right ICA are consistent with a 1-39%  stenosis.  - Left Carotid: Velocities in the left ICA are consistent with a 1-39%  stenosis.  - Vertebrals:  Bilateral vertebral arteries demonstrate antegrade flow.  - Subclavians: Normal flow hemodynamics were seen in bilateral subclavian               arteries.      LHC  10/24/21 (PRE-CABG):   Ost LM to Mid LM lesion is 50% stenosed.   Dist LM lesion is 75% stenosed.   Ost LAD to Prox LAD lesion is 50% stenosed.  Ectatic segment with eccentric calcium.   Ost Cx to Prox Cx lesion is 70% stenosed.   Mid LAD lesion is 50% stenosed.   Prox RCA to Mid RCA lesion is 70% stenosed.   LV end diastolic pressure is normal.   There is no aortic valve stenosis.   - Eccentric, severely calcified left main disease extending into the ostial LAD.  There is also ostial disease and a relatively small circumflex vessel.  Diffuse, calcific disease in the mid RCA today. - Patient was initially scheduled for A-fib ablation.  Plan now for cardiac surgery consult with consideration of CABG, maze and left atrial appendage clip.     CT Cardiac (ordered for anticipated ablation) 10/18/21: IMPRESSION: 1. There is normal pulmonary vein drainage into the left atrium. No pulmonary vein stenosis. 2. Normal left atrial appendage, no left atrial appendage thrombus. No intracardiac mass or thrombus. 3. The esophagus runs in the left atrial midline and is not in the proximity to any of the pulmonary veins. 4. Coronary artery calcium score is 4439, which is 99th percentile for age and sex matched peers. Dense 3 vessel coronary calcifications involving the proximal vessels including left main coronary artery. 5. Mild dilation of main pulmonary artery, 30 mm, may indicate increased pulmonary pressure. - Referred for LHC by Dr. Elberta Fortis.   Past Medical History:  Diagnosis Date   Acute kidney failure, unspecified (HCC) 12/05/2021   Anemia 04/17/2022   Blood transfusion   Atherosclerosis of native arteries of extremities with rest pain, right leg (HCC) 03/23/2023   Atherosclerotic heart disease    Benign essential HTN 07/30/2014   Bradycardia 01/25/2015   Cardiomyopathy, unspecified (HCC) 12/03/2021   CHF (congestive heart failure) (HCC)    Chronic anticoagulation 02/25/2018   Chronic  diastolic CHF (congestive heart failure) (HCC) 07/15/2021   Chronic systolic CHF (congestive heart failure), NYHA class 3 (HCC) 08/30/2014   Overview:  Global ef 30%   CKD (chronic kidney disease)    Class 3 severe obesity in adult [facility]) 04/28/2017   Coronary artery disease    Critical limb ischemia of right lower extremity (HCC) 03/19/2023   Diabetes mellitus without complication (HCC)    Diabetic neuropathy (HCC) 07/28/2014   Dysrhythmia    Erectile dysfunction    GERD (gastroesophageal reflux disease)    High risk medication use 05/26/2018   Hypertensive heart disease with heart failure (HCC) 07/30/2014   Leukocytosis 11/26/2021   Limb ischemia 01/26/2023   Lumbago    LV dysfunction 04/28/2017   Mixed hyperlipidemia 07/28/2014   OSA (obstructive sleep apnea)    CPAP   PAD (peripheral artery disease) (HCC) 01/26/2023   Paroxysmal atrial fibrillation (HCC) 07/28/2014   Peripheral arterial disease (HCC)  PFO (patent foramen ovale)    Postop check 01/06/2022   S/P CABG x 3 11/12/2021   Sinus node dysfunction (HCC) 04/21/2016   Testicular hypofunction    Uncontrolled type 2 diabetes mellitus with microalbuminuric diabetic nephropathy 07/28/2014    Past Surgical History:  Procedure Laterality Date   ABDOMINAL AORTOGRAM W/LOWER EXTREMITY N/A 12/29/2022   Procedure: ABDOMINAL AORTOGRAM W/LOWER EXTREMITY;  Surgeon: Maeola Harman, MD;  [facility] INVASIVE CV LAB;  Service: Cardiovascular;  Laterality: N/A;   ABDOMINAL AORTOGRAM W/LOWER EXTREMITY N/A 01/22/2023   Procedure: ABDOMINAL AORTOGRAM W/LOWER EXTREMITY;  Surgeon: [name], MD;  Location: [facility] INVASIVE CV LAB;  Service: Cardiovascular;  Laterality: N/A;   ABDOMINAL AORTOGRAM W/LOWER EXTREMITY N/A 03/06/2023   Procedure: ABDOMINAL AORTOGRAM W/LOWER EXTREMITY;  Surgeon: Leonie Deakon, MD;  Location: [facility] INVASIVE CV LAB;  Service: Cardiovascular;  Laterality: N/A;   AMPUTATION Left 01/26/2023   Procedure:  LEFT AMPUTATION BELOW KNEE;  Surgeon: Leonie Kacin, MD;  Location: [facility] OR;  Service: Vascular;  Laterality: Left;  with regional block   APPLICATION OF WOUND VAC Left 01/26/2023   Procedure: APPLICATION OF WOUND VAC, LEFT BELOW THE KNEE AMPUTATION STUMP;  Surgeon: Leonie Tanvir, MD;  Location: [facility] OR;  Service: Vascular;  Laterality: Left;  With regional block   APPLICATION OF WOUND VAC Left 02/10/2023   Procedure: APPLICATION OF WOUND VAC;  Surgeon: Chuck Hint, MD;  Location: [facility] OR;  Service: Vascular;  Laterality: Left;   AV FISTULA PLACEMENT Right 05/20/2022   Procedure: RIGHT ARM ARTERIOVENOUS (AV) FISTULA CREATION;  Surgeon: Larina Earthly, MD;  Location: [facility] OR;  Service: Vascular;  Laterality: Right;   BUBBLE STUDY  11/21/2021   Procedure: BUBBLE STUDY;  Surgeon: Laurey Morale, MD;  Location: [facility] ENDOSCOPY;  Service: Cardiovascular;;   CARDIAC CATHETERIZATION     CARDIOVERSION N/A 11/21/2021   Procedure: CARDIOVERSION;  Surgeon: Laurey Morale, MD;  Location: [facility] ENDOSCOPY;  Service: Cardiovascular;  Laterality: N/A;   CARDIOVERSION N/A 11/25/2021   Procedure: CARDIOVERSION;  Surgeon: Laurey Morale, MD;  Location: [facility] ENDOSCOPY;  Service: Cardiovascular;  Laterality: N/A;   CARDIOVERSION N/A 11/29/2021   Procedure: CARDIOVERSION;  Surgeon: Laurey Morale, MD;  Location: [facility] ENDOSCOPY;  Service: Cardiovascular;  Laterality: N/A;   CARDIOVERSION N/A 02/27/2022   Procedure: CARDIOVERSION;  Surgeon: Little Ishikawa, MD;  Location: [facility] ENDOSCOPY;  Service: Cardiovascular;  Laterality: N/A;   CORONARY ARTERY BYPASS GRAFT N/A 11/12/2021   Procedure: CORONARY ARTERY BYPASS GRAFTING (CABG) x  3 USING LEFT INTERNAL MAMMARY ARTERY AND RIGHT GREATER SAPHENOUS VEIN;  Surgeon: Lovett Sox, MD;  Location: [facility] OR;  Service: Open Heart Surgery;  Laterality: N/A;   ELECTROPHYSIOLOGIC STUDY N/A 01/18/2015   Procedure: CARDIOVERSION;  Surgeon: Lamar Blinks, MD;  Location: [facility]  ORS;  Service: Cardiovascular;  Laterality: N/A;   ENDOVEIN HARVEST OF GREATER SAPHENOUS VEIN  11/12/2021   Procedure: ENDOVEIN HARVEST OF GREATER SAPHENOUS VEIN;  Surgeon: Lovett Sox, MD;  Location: [facility] OR;  Service: Open Heart Surgery;;   EYE SURGERY Bilateral 2022   FEMORAL-POPLITEAL BYPASS GRAFT Right 03/19/2023   Procedure: RIGHT COMMON FEMORAL-BELOW KNEE POPLITEAL ARTERY BYPASS;  Surgeon: Leonie Zebadiah, MD;  Location: [facility] OR;  Service: Vascular;  Laterality: Right;   FISTULA SUPERFICIALIZATION Right 07/10/2022   Procedure: RIGHT ARM ARTERIOVENOUS FISTULA SUPERFICIALIZATION;  Surgeon: Maeola Harman, MD;  Location: [facility] OR;  Service: Vascular;  Laterality: Right;   IR FLUORO GUIDE CV LINE RIGHT  11/28/2021   IR US GUIDE VASC ACCESS RIGHT  11/28/2021   LEFT HEART CATH AND CORONARY ANGIOGRAPHY N/A 10/24/2021   Procedure: LEFT HEART CATH AND CORONARY ANGIOGRAPHY;  Surgeon: Corky Crafts, MD;  Location: [facility] INVASIVE CV LAB;  Service: Cardiovascular;  Laterality: N/A;   MAZE N/A 11/12/2021   Procedure: MAZE;  Surgeon: Lovett Sox, MD;  Location: [facility] OR;  Service: Open Heart Surgery;  Laterality: N/A;   PERIPHERAL VASCULAR ATHERECTOMY  12/29/2022   Procedure: PERIPHERAL VASCULAR ATHERECTOMY;  Surgeon: Maeola Harman, MD;  Location: [facility] INVASIVE CV LAB;  Service: Cardiovascular;;   PERIPHERAL VASCULAR INTERVENTION  12/29/2022   Procedure: PERIPHERAL VASCULAR INTERVENTION;  Surgeon: Maeola Harman, MD;  Location: [facility] INVASIVE CV LAB;  Service: Cardiovascular;;  Left SFA, Bilateral illiac   PERIPHERAL VASCULAR INTERVENTION Right 03/06/2023   Procedure: PERIPHERAL VASCULAR INTERVENTION;  Surgeon: Leonie Albie, MD;  [facility] INVASIVE CV LAB;  Service: Cardiovascular;  Laterality: Right;  SFA   STUMP REVISION Left 02/10/2023   Procedure: LEFT BELOW KNEE AMPUTATION REVISION;  Surgeon: Chuck Hint, MD;  Location: [facility] OR;  Service: Vascular;   Laterality: Left;   TEE WITHOUT CARDIOVERSION N/A 11/12/2021   Procedure: TRANSESOPHAGEAL ECHOCARDIOGRAM (TEE);  Surgeon: Lovett Sox, MD;  Location: [facility] OR;  Service: Open Heart Surgery;  Laterality: N/A;   TEE WITHOUT CARDIOVERSION N/A 11/21/2021   Procedure: TRANSESOPHAGEAL ECHOCARDIOGRAM (TEE);  Surgeon: Laurey Morale, MD;  Location: [facility] ENDOSCOPY;  Service: Cardiovascular;  Laterality: N/A;    MEDICATIONS: No current facility-administered medications for this encounter.    Accu-Chek Softclix Lancets lancets   albuterol (VENTOLIN HFA) 108 (90 Base) MCG/ACT inhaler   amiodarone (PACERONE) 200 MG tablet   apixaban (ELIQUIS) 5 MG TABS tablet   atorvastatin (LIPITOR) 80 MG tablet   B Complex-C-Folic Acid (DIALYVITE 800) 0.8 MG TABS   Blood Glucose Monitoring Suppl (ACCU-CHEK GUIDE ME) w/Device KIT   collagenase (SANTYL) 250 UNIT/GM ointment   Dulaglutide (TRULICITY) 3 MG/0.5ML SOPN   fluticasone furoate-vilanterol (BREO ELLIPTA) 200-25 MCG/ACT AEPB   glucose blood test strip   insulin glargine (LANTUS) 100 UNIT/ML injection   Insulin Pen Needle 32G X 4 MM MISC   midodrine (PROAMATINE) 5 MG tablet   torsemide (DEMADEX) 20 MG tablet     Shonna Chock, PA-C Surgical Short Stay/Anesthesiology [facility] Phone [phone] [facility] Phone [phone] 08/31/2023 4:08 PM

## 2023-09-01 ENCOUNTER — Encounter (HOSPITAL_COMMUNITY): Payer: Self-pay | Admitting: Vascular Surgery

## 2023-09-01 ENCOUNTER — Ambulatory Visit (HOSPITAL_BASED_OUTPATIENT_CLINIC_OR_DEPARTMENT_OTHER): Admission: RE | Admit: 2023-09-01 | Payer: Medicare HMO | Source: Home / Self Care | Admitting: Plastic Surgery

## 2023-09-01 HISTORY — DX: Depression, unspecified: F32.A

## 2023-09-01 SURGERY — DEBRIDEMENT, WOUND, WITH CLOSURE
Anesthesia: Choice | Laterality: Right

## 2023-09-02 ENCOUNTER — Ambulatory Visit (INDEPENDENT_AMBULATORY_CARE_PROVIDER_SITE_OTHER): Admitting: Plastic Surgery

## 2023-09-02 DIAGNOSIS — S81801A Unspecified open wound, right lower leg, initial encounter: Secondary | ICD-10-CM

## 2023-09-02 DIAGNOSIS — S91301D Unspecified open wound, right foot, subsequent encounter: Secondary | ICD-10-CM

## 2023-09-02 NOTE — Progress Notes (Signed)
 [name] [name] returns today after surgery was canceled yesterday.  He did not hold his GLP-1 long enough and the surgery had to be canceled.  He needs to be rescheduled for placement of his myriad.  On examination today the wound on the dorsum of his right foot is healing very nicely.  He has good granulation tissue over the tendons and the granulation tissue appears to be healthy and clean.  Evaluation of the rest of the foot reveals what appears to be a completely ischemic fifth toe on the right foot.  The remainder of the foot while having several wounds appears to be well-perfused.  It is warm and capillary refill in the great toe is brisk.  I will try to get insurance approval for placement of Symphony which can be used in the office.  If this is not possible will reschedule him for placement of myriad in the operating room.  Have contacted his vascular surgeon to try to arrange follow-up regarding the toe on the right foot.  30 minutes spent reviewing chart, examining patient, discussing ongoing care, coordinating care, and documenting.

## 2023-09-02 NOTE — Addendum Note (Signed)
 Addended by: Angelita Ingles on: 09/02/2023 03:22 PM   Modules accepted: Orders

## 2023-09-04 ENCOUNTER — Telehealth: Payer: Self-pay

## 2023-09-04 DIAGNOSIS — E1140 Type 2 diabetes mellitus with diabetic neuropathy, unspecified: Secondary | ICD-10-CM | POA: Diagnosis not present

## 2023-09-04 DIAGNOSIS — L97519 Non-pressure chronic ulcer of other part of right foot with unspecified severity: Secondary | ICD-10-CM | POA: Diagnosis not present

## 2023-09-04 DIAGNOSIS — N186 End stage renal disease: Secondary | ICD-10-CM | POA: Diagnosis not present

## 2023-09-04 DIAGNOSIS — E1151 Type 2 diabetes mellitus with diabetic peripheral angiopathy without gangrene: Secondary | ICD-10-CM | POA: Diagnosis not present

## 2023-09-04 DIAGNOSIS — Z4781 Encounter for orthopedic aftercare following surgical amputation: Secondary | ICD-10-CM | POA: Diagnosis not present

## 2023-09-04 DIAGNOSIS — I70223 Atherosclerosis of native arteries of extremities with rest pain, bilateral legs: Secondary | ICD-10-CM | POA: Diagnosis not present

## 2023-09-04 DIAGNOSIS — E11621 Type 2 diabetes mellitus with foot ulcer: Secondary | ICD-10-CM | POA: Diagnosis not present

## 2023-09-04 DIAGNOSIS — L89610 Pressure ulcer of right heel, unstageable: Secondary | ICD-10-CM | POA: Diagnosis not present

## 2023-09-04 DIAGNOSIS — N2581 Secondary hyperparathyroidism of renal origin: Secondary | ICD-10-CM | POA: Diagnosis not present

## 2023-09-04 DIAGNOSIS — Z89512 Acquired absence of left leg below knee: Secondary | ICD-10-CM | POA: Diagnosis not present

## 2023-09-04 DIAGNOSIS — Z992 Dependence on renal dialysis: Secondary | ICD-10-CM | POA: Diagnosis not present

## 2023-09-04 DIAGNOSIS — L89510 Pressure ulcer of right ankle, unstageable: Secondary | ICD-10-CM | POA: Diagnosis not present

## 2023-09-04 NOTE — Telephone Encounter (Signed)
 April, RN with [facility] left voicemail stating that she went to visit patient today and the wound on R lateral foot is larger, the pinky toe is black and has drainage. She also c/o a strong odor coming from the wound as well. She asked if someone could give her a call back.   I called April back and adv that I would send a note to Dr. Ladona Ridgel and the PA. I adv that per Dr. Lubertha Basque note from Wednesday he documented that the pinky toe on R foot was ischemic and that someone would probably call her Monday to touch base with her. She conveyed understanding.

## 2023-09-07 ENCOUNTER — Telehealth: Payer: Self-pay

## 2023-09-07 ENCOUNTER — Other Ambulatory Visit: Payer: Self-pay

## 2023-09-07 ENCOUNTER — Emergency Department (HOSPITAL_COMMUNITY)

## 2023-09-07 ENCOUNTER — Inpatient Hospital Stay (HOSPITAL_COMMUNITY)
Admission: EM | Admit: 2023-09-07 | Discharge: 2023-09-20 | DRG: 239 | Disposition: A | Discharge status: Skilled Nursing Facility | Attending: Internal Medicine | Admitting: Internal Medicine

## 2023-09-07 ENCOUNTER — Encounter (HOSPITAL_COMMUNITY): Payer: Self-pay

## 2023-09-07 DIAGNOSIS — M009 Pyogenic arthritis, unspecified: Secondary | ICD-10-CM | POA: Diagnosis present

## 2023-09-07 DIAGNOSIS — M726 Necrotizing fasciitis: Secondary | ICD-10-CM | POA: Diagnosis present

## 2023-09-07 DIAGNOSIS — M25474 Effusion, right foot: Secondary | ICD-10-CM | POA: Diagnosis not present

## 2023-09-07 DIAGNOSIS — N2581 Secondary hyperparathyroidism of renal origin: Secondary | ICD-10-CM | POA: Diagnosis present

## 2023-09-07 DIAGNOSIS — E46 Unspecified protein-calorie malnutrition: Secondary | ICD-10-CM | POA: Diagnosis not present

## 2023-09-07 DIAGNOSIS — L97419 Non-pressure chronic ulcer of right heel and midfoot with unspecified severity: Secondary | ICD-10-CM | POA: Diagnosis present

## 2023-09-07 DIAGNOSIS — E876 Hypokalemia: Secondary | ICD-10-CM | POA: Diagnosis not present

## 2023-09-07 DIAGNOSIS — E1165 Type 2 diabetes mellitus with hyperglycemia: Secondary | ICD-10-CM | POA: Diagnosis not present

## 2023-09-07 DIAGNOSIS — Z8249 Family history of ischemic heart disease and other diseases of the circulatory system: Secondary | ICD-10-CM

## 2023-09-07 DIAGNOSIS — R531 Weakness: Secondary | ICD-10-CM | POA: Diagnosis not present

## 2023-09-07 DIAGNOSIS — D631 Anemia in chronic kidney disease: Secondary | ICD-10-CM | POA: Diagnosis present

## 2023-09-07 DIAGNOSIS — Z95820 Peripheral vascular angioplasty status with implants and grafts: Secondary | ICD-10-CM

## 2023-09-07 DIAGNOSIS — I132 Hypertensive heart and chronic kidney disease with heart failure and with stage 5 chronic kidney disease, or end stage renal disease: Secondary | ICD-10-CM | POA: Diagnosis present

## 2023-09-07 DIAGNOSIS — R451 Restlessness and agitation: Secondary | ICD-10-CM | POA: Diagnosis not present

## 2023-09-07 DIAGNOSIS — I96 Gangrene, not elsewhere classified: Secondary | ICD-10-CM | POA: Diagnosis not present

## 2023-09-07 DIAGNOSIS — Z1152 Encounter for screening for COVID-19: Secondary | ICD-10-CM

## 2023-09-07 DIAGNOSIS — M6281 Muscle weakness (generalized): Secondary | ICD-10-CM | POA: Diagnosis not present

## 2023-09-07 DIAGNOSIS — K219 Gastro-esophageal reflux disease without esophagitis: Secondary | ICD-10-CM | POA: Diagnosis present

## 2023-09-07 DIAGNOSIS — I5032 Chronic diastolic (congestive) heart failure: Secondary | ICD-10-CM | POA: Diagnosis not present

## 2023-09-07 DIAGNOSIS — A419 Sepsis, unspecified organism: Secondary | ICD-10-CM | POA: Diagnosis not present

## 2023-09-07 DIAGNOSIS — Z813 Family history of other psychoactive substance abuse and dependence: Secondary | ICD-10-CM

## 2023-09-07 DIAGNOSIS — M86651 Other chronic osteomyelitis, right thigh: Secondary | ICD-10-CM | POA: Diagnosis not present

## 2023-09-07 DIAGNOSIS — Z992 Dependence on renal dialysis: Secondary | ICD-10-CM

## 2023-09-07 DIAGNOSIS — G4733 Obstructive sleep apnea (adult) (pediatric): Secondary | ICD-10-CM | POA: Diagnosis present

## 2023-09-07 DIAGNOSIS — A480 Gas gangrene: Secondary | ICD-10-CM | POA: Diagnosis present

## 2023-09-07 DIAGNOSIS — Z801 Family history of malignant neoplasm of trachea, bronchus and lung: Secondary | ICD-10-CM

## 2023-09-07 DIAGNOSIS — Z91048 Other nonmedicinal substance allergy status: Secondary | ICD-10-CM

## 2023-09-07 DIAGNOSIS — I5022 Chronic systolic (congestive) heart failure: Secondary | ICD-10-CM | POA: Diagnosis not present

## 2023-09-07 DIAGNOSIS — L97514 Non-pressure chronic ulcer of other part of right foot with necrosis of bone: Secondary | ICD-10-CM | POA: Diagnosis not present

## 2023-09-07 DIAGNOSIS — S91301A Unspecified open wound, right foot, initial encounter: Secondary | ICD-10-CM | POA: Diagnosis not present

## 2023-09-07 DIAGNOSIS — Z951 Presence of aortocoronary bypass graft: Secondary | ICD-10-CM | POA: Diagnosis not present

## 2023-09-07 DIAGNOSIS — E782 Mixed hyperlipidemia: Secondary | ICD-10-CM | POA: Diagnosis present

## 2023-09-07 DIAGNOSIS — L97429 Non-pressure chronic ulcer of left heel and midfoot with unspecified severity: Secondary | ICD-10-CM | POA: Diagnosis present

## 2023-09-07 DIAGNOSIS — I429 Cardiomyopathy, unspecified: Secondary | ICD-10-CM | POA: Diagnosis present

## 2023-09-07 DIAGNOSIS — S86211A Strain of muscle(s) and tendon(s) of anterior muscle group at lower leg level, right leg, initial encounter: Secondary | ICD-10-CM | POA: Diagnosis not present

## 2023-09-07 DIAGNOSIS — D62 Acute posthemorrhagic anemia: Secondary | ICD-10-CM | POA: Diagnosis not present

## 2023-09-07 DIAGNOSIS — M86151 Other acute osteomyelitis, right femur: Secondary | ICD-10-CM | POA: Diagnosis not present

## 2023-09-07 DIAGNOSIS — I2510 Atherosclerotic heart disease of native coronary artery without angina pectoris: Secondary | ICD-10-CM | POA: Diagnosis present

## 2023-09-07 DIAGNOSIS — N186 End stage renal disease: Secondary | ICD-10-CM | POA: Diagnosis present

## 2023-09-07 DIAGNOSIS — I480 Paroxysmal atrial fibrillation: Secondary | ICD-10-CM | POA: Diagnosis not present

## 2023-09-07 DIAGNOSIS — M86652 Other chronic osteomyelitis, left thigh: Secondary | ICD-10-CM | POA: Diagnosis not present

## 2023-09-07 DIAGNOSIS — E1122 Type 2 diabetes mellitus with diabetic chronic kidney disease: Secondary | ICD-10-CM | POA: Diagnosis present

## 2023-09-07 DIAGNOSIS — Z7401 Bed confinement status: Secondary | ICD-10-CM | POA: Diagnosis not present

## 2023-09-07 DIAGNOSIS — L97519 Non-pressure chronic ulcer of other part of right foot with unspecified severity: Secondary | ICD-10-CM | POA: Diagnosis not present

## 2023-09-07 DIAGNOSIS — M869 Osteomyelitis, unspecified: Secondary | ICD-10-CM | POA: Diagnosis not present

## 2023-09-07 DIAGNOSIS — Z955 Presence of coronary angioplasty implant and graft: Secondary | ICD-10-CM

## 2023-09-07 DIAGNOSIS — L97524 Non-pressure chronic ulcer of other part of left foot with necrosis of bone: Secondary | ICD-10-CM | POA: Diagnosis not present

## 2023-09-07 DIAGNOSIS — E1152 Type 2 diabetes mellitus with diabetic peripheral angiopathy with gangrene: Principal | ICD-10-CM | POA: Diagnosis present

## 2023-09-07 DIAGNOSIS — M86152 Other acute osteomyelitis, left femur: Secondary | ICD-10-CM | POA: Diagnosis not present

## 2023-09-07 DIAGNOSIS — E785 Hyperlipidemia, unspecified: Secondary | ICD-10-CM | POA: Diagnosis not present

## 2023-09-07 DIAGNOSIS — J449 Chronic obstructive pulmonary disease, unspecified: Secondary | ICD-10-CM | POA: Diagnosis present

## 2023-09-07 DIAGNOSIS — E1140 Type 2 diabetes mellitus with diabetic neuropathy, unspecified: Secondary | ICD-10-CM | POA: Diagnosis present

## 2023-09-07 DIAGNOSIS — L89322 Pressure ulcer of left buttock, stage 2: Secondary | ICD-10-CM | POA: Diagnosis present

## 2023-09-07 DIAGNOSIS — E0841 Diabetes mellitus due to underlying condition with diabetic mononeuropathy: Secondary | ICD-10-CM

## 2023-09-07 DIAGNOSIS — Z87891 Personal history of nicotine dependence: Secondary | ICD-10-CM

## 2023-09-07 DIAGNOSIS — F32A Depression, unspecified: Secondary | ICD-10-CM | POA: Diagnosis present

## 2023-09-07 DIAGNOSIS — Z6832 Body mass index (BMI) 32.0-32.9, adult: Secondary | ICD-10-CM | POA: Diagnosis not present

## 2023-09-07 DIAGNOSIS — Y835 Amputation of limb(s) as the cause of abnormal reaction of the patient, or of later complication, without mention of misadventure at the time of the procedure: Secondary | ICD-10-CM | POA: Diagnosis present

## 2023-09-07 DIAGNOSIS — M868X7 Other osteomyelitis, ankle and foot: Secondary | ICD-10-CM | POA: Diagnosis present

## 2023-09-07 DIAGNOSIS — Z89611 Acquired absence of right leg above knee: Secondary | ICD-10-CM | POA: Diagnosis not present

## 2023-09-07 DIAGNOSIS — R918 Other nonspecific abnormal finding of lung field: Secondary | ICD-10-CM | POA: Diagnosis not present

## 2023-09-07 DIAGNOSIS — I5042 Chronic combined systolic (congestive) and diastolic (congestive) heart failure: Secondary | ICD-10-CM | POA: Diagnosis present

## 2023-09-07 DIAGNOSIS — E11621 Type 2 diabetes mellitus with foot ulcer: Secondary | ICD-10-CM | POA: Diagnosis present

## 2023-09-07 DIAGNOSIS — E119 Type 2 diabetes mellitus without complications: Secondary | ICD-10-CM | POA: Diagnosis not present

## 2023-09-07 DIAGNOSIS — T8789 Other complications of amputation stump: Secondary | ICD-10-CM | POA: Diagnosis present

## 2023-09-07 DIAGNOSIS — I472 Ventricular tachycardia, unspecified: Secondary | ICD-10-CM | POA: Diagnosis not present

## 2023-09-07 DIAGNOSIS — L859 Epidermal thickening, unspecified: Secondary | ICD-10-CM | POA: Diagnosis not present

## 2023-09-07 DIAGNOSIS — L89153 Pressure ulcer of sacral region, stage 3: Secondary | ICD-10-CM | POA: Diagnosis present

## 2023-09-07 DIAGNOSIS — M19071 Primary osteoarthritis, right ankle and foot: Secondary | ICD-10-CM | POA: Diagnosis not present

## 2023-09-07 DIAGNOSIS — I70262 Atherosclerosis of native arteries of extremities with gangrene, left leg: Secondary | ICD-10-CM | POA: Diagnosis present

## 2023-09-07 DIAGNOSIS — E66811 Obesity, class 1: Secondary | ICD-10-CM | POA: Diagnosis present

## 2023-09-07 DIAGNOSIS — I110 Hypertensive heart disease with heart failure: Secondary | ICD-10-CM | POA: Diagnosis not present

## 2023-09-07 DIAGNOSIS — Z89612 Acquired absence of left leg above knee: Secondary | ICD-10-CM | POA: Diagnosis not present

## 2023-09-07 DIAGNOSIS — E1169 Type 2 diabetes mellitus with other specified complication: Secondary | ICD-10-CM | POA: Diagnosis present

## 2023-09-07 DIAGNOSIS — I9589 Other hypotension: Secondary | ICD-10-CM | POA: Diagnosis present

## 2023-09-07 DIAGNOSIS — Z7901 Long term (current) use of anticoagulants: Secondary | ICD-10-CM

## 2023-09-07 DIAGNOSIS — Z79899 Other long term (current) drug therapy: Secondary | ICD-10-CM

## 2023-09-07 DIAGNOSIS — Z794 Long term (current) use of insulin: Secondary | ICD-10-CM

## 2023-09-07 DIAGNOSIS — I10 Essential (primary) hypertension: Secondary | ICD-10-CM | POA: Diagnosis not present

## 2023-09-07 DIAGNOSIS — R6510 Systemic inflammatory response syndrome (SIRS) of non-infectious origin without acute organ dysfunction: Secondary | ICD-10-CM | POA: Diagnosis not present

## 2023-09-07 DIAGNOSIS — Z751 Person awaiting admission to adequate facility elsewhere: Secondary | ICD-10-CM

## 2023-09-07 DIAGNOSIS — Z4781 Encounter for orthopedic aftercare following surgical amputation: Secondary | ICD-10-CM | POA: Diagnosis not present

## 2023-09-07 DIAGNOSIS — M7989 Other specified soft tissue disorders: Secondary | ICD-10-CM | POA: Diagnosis not present

## 2023-09-07 LAB — CBC WITH DIFFERENTIAL/PLATELET
Abs Immature Granulocytes: 0 10*3/uL (ref 0.00–0.07)
Basophils Absolute: 0.3 10*3/uL — ABNORMAL HIGH (ref 0.0–0.1)
Basophils Relative: 1 %
Eosinophils Absolute: 0 10*3/uL (ref 0.0–0.5)
Eosinophils Relative: 0 %
HCT: 26.4 % — ABNORMAL LOW (ref 39.0–52.0)
Hemoglobin: 8 g/dL — ABNORMAL LOW (ref 13.0–17.0)
Lymphocytes Relative: 0 %
Lymphs Abs: 0 10*3/uL — ABNORMAL LOW (ref 0.7–4.0)
MCH: 24.5 pg — ABNORMAL LOW (ref 26.0–34.0)
MCHC: 30.3 g/dL (ref 30.0–36.0)
MCV: 81 fL (ref 80.0–100.0)
Monocytes Absolute: 1.7 10*3/uL — ABNORMAL HIGH (ref 0.1–1.0)
Monocytes Relative: 6 %
Neutro Abs: 25.9 10*3/uL — ABNORMAL HIGH (ref 1.7–7.7)
Neutrophils Relative %: 93 %
Platelets: 550 10*3/uL — ABNORMAL HIGH (ref 150–400)
RBC: 3.26 MIL/uL — ABNORMAL LOW (ref 4.22–5.81)
RDW: 18.5 % — ABNORMAL HIGH (ref 11.5–15.5)
WBC: 27.9 10*3/uL — ABNORMAL HIGH (ref 4.0–10.5)
nRBC: 0 % (ref 0.0–0.2)
nRBC: 0 /100{WBCs}

## 2023-09-07 LAB — RESP PANEL BY RT-PCR (RSV, FLU A&B, COVID)  RVPGX2
Influenza A by PCR: NEGATIVE
Influenza B by PCR: NEGATIVE
Resp Syncytial Virus by PCR: NEGATIVE
SARS Coronavirus 2 by RT PCR: NEGATIVE

## 2023-09-07 LAB — COMPREHENSIVE METABOLIC PANEL
ALT: 49 U/L — ABNORMAL HIGH (ref 0–44)
AST: 79 U/L — ABNORMAL HIGH (ref 15–41)
Albumin: 1.7 g/dL — ABNORMAL LOW (ref 3.5–5.0)
Alkaline Phosphatase: 71 U/L (ref 38–126)
Anion gap: 13 (ref 5–15)
BUN: 16 mg/dL (ref 8–23)
CO2: 30 mmol/L (ref 22–32)
Calcium: 7.6 mg/dL — ABNORMAL LOW (ref 8.9–10.3)
Chloride: 96 mmol/L — ABNORMAL LOW (ref 98–111)
Creatinine, Ser: 3.52 mg/dL — ABNORMAL HIGH (ref 0.61–1.24)
GFR, Estimated: 18 mL/min — ABNORMAL LOW (ref >60)
Glucose, Bld: 125 mg/dL — ABNORMAL HIGH (ref 70–99)
Potassium: 2.5 mmol/L — CL (ref 3.5–5.1)
Sodium: 139 mmol/L (ref 135–145)
Total Bilirubin: 0.9 mg/dL (ref 0.0–1.2)
Total Protein: 7.2 g/dL (ref 6.5–8.1)

## 2023-09-07 LAB — I-STAT CG4 LACTIC ACID, ED
Lactic Acid, Venous: 2.5 mmol/L (ref 0.5–1.9)
Lactic Acid, Venous: 1.9 mmol/L (ref 0.5–1.9)

## 2023-09-07 LAB — HEMOGLOBIN A1C
Hgb A1c MFr Bld: 6.1 % — ABNORMAL HIGH (ref 4.8–5.6)
Mean Plasma Glucose: 128.37 mg/dL

## 2023-09-07 LAB — PROTIME-INR
INR: 3.3 — ABNORMAL HIGH (ref 0.8–1.2)
Prothrombin Time: 34.1 s — ABNORMAL HIGH (ref 11.4–15.2)

## 2023-09-07 LAB — APTT: aPTT: 46 s — ABNORMAL HIGH (ref 24–36)

## 2023-09-07 LAB — BRAIN NATRIURETIC PEPTIDE: B Natriuretic Peptide: 484.4 pg/mL — ABNORMAL HIGH (ref 0.0–100.0)

## 2023-09-07 MED ORDER — MORPHINE SULFATE (PF) 4 MG/ML IV SOLN
4.0000 mg | Freq: Once | INTRAVENOUS | Status: AC
  Administered 2023-09-07: 4 mg via INTRAVENOUS
  Filled 2023-09-07: qty 1

## 2023-09-07 MED ORDER — CLINDAMYCIN PHOSPHATE 600 MG/50ML IV SOLN: 600.0000 mg | Freq: Once | INTRAVENOUS | Status: DC

## 2023-09-07 MED ORDER — SODIUM CHLORIDE 0.9 % IV SOLN: 2.0000 g | INTRAVENOUS | Status: DC

## 2023-09-07 MED ORDER — MAGNESIUM OXIDE -MG SUPPLEMENT 400 (240 MG) MG PO TABS
800.0000 mg | ORAL_TABLET | Freq: Once | ORAL | Status: AC
  Administered 2023-09-07: 800 mg via ORAL
  Filled 2023-09-07: qty 2

## 2023-09-07 MED ORDER — ATORVASTATIN CALCIUM 80 MG PO TABS
80.0000 mg | ORAL_TABLET | Freq: Every day | ORAL | Status: DC
  Administered 2023-09-09 – 2023-09-20 (×12): 80 mg via ORAL
  Filled 2023-09-07 (×12): qty 1

## 2023-09-07 MED ORDER — SODIUM CHLORIDE 0.9 % IV SOLN
2.0000 g | Freq: Once | INTRAVENOUS | Status: AC
  Administered 2023-09-07: 2 g via INTRAVENOUS
  Filled 2023-09-07: qty 12.5

## 2023-09-07 MED ORDER — ACETAMINOPHEN 650 MG RE SUPP: 650.0000 mg | Freq: Four times a day (QID) | RECTAL | Status: DC | PRN

## 2023-09-07 MED ORDER — MIDODRINE HCL 5 MG PO TABS
10.0000 mg | ORAL_TABLET | Freq: Once | ORAL | Status: AC
  Administered 2023-09-08: 10 mg via ORAL
  Filled 2023-09-07: qty 2

## 2023-09-07 MED ORDER — VANCOMYCIN HCL IN DEXTROSE 1-5 GM/200ML-% IV SOLN: 1000.0000 mg | Freq: Once | INTRAVENOUS | Status: DC

## 2023-09-07 MED ORDER — GADOBUTROL 1 MMOL/ML IV SOLN
10.0000 mL | Freq: Once | INTRAVENOUS | Status: AC | PRN
  Administered 2023-09-07: 10 mL via INTRAVENOUS

## 2023-09-07 MED ORDER — ACETAMINOPHEN 325 MG PO TABS: 650.0000 mg | ORAL_TABLET | Freq: Four times a day (QID) | ORAL | Status: DC | PRN

## 2023-09-07 MED ORDER — ONDANSETRON HCL 4 MG PO TABS: 4.0000 mg | ORAL_TABLET | Freq: Four times a day (QID) | ORAL | Status: DC | PRN

## 2023-09-07 MED ORDER — POTASSIUM CHLORIDE 10 MEQ/100ML IV SOLN
10.0000 meq | Freq: Once | INTRAVENOUS | Status: AC
  Administered 2023-09-07: 10 meq via INTRAVENOUS
  Filled 2023-09-07: qty 100

## 2023-09-07 MED ORDER — LACTATED RINGERS IV SOLN: INTRAVENOUS | Status: DC

## 2023-09-07 MED ORDER — POTASSIUM CHLORIDE CRYS ER 20 MEQ PO TBCR
40.0000 meq | EXTENDED_RELEASE_TABLET | Freq: Once | ORAL | Status: AC
  Administered 2023-09-07: 40 meq via ORAL
  Filled 2023-09-07: qty 2

## 2023-09-07 MED ORDER — ONDANSETRON HCL 4 MG/2ML IJ SOLN: 4.0000 mg | Freq: Four times a day (QID) | INTRAMUSCULAR | Status: DC | PRN

## 2023-09-07 MED ORDER — HYDROCODONE-ACETAMINOPHEN 5-325 MG PO TABS
1.0000 | ORAL_TABLET | ORAL | Status: DC | PRN
  Administered 2023-09-08 – 2023-09-12 (×9): 2 via ORAL
  Administered 2023-09-13: 1 via ORAL
  Administered 2023-09-13 – 2023-09-20 (×16): 2 via ORAL
  Filled 2023-09-07 (×22): qty 2
  Filled 2023-09-07: qty 1
  Filled 2023-09-07 (×3): qty 2

## 2023-09-07 MED ORDER — LACTATED RINGERS IV BOLUS (SEPSIS)
500.0000 mL | Freq: Once | INTRAVENOUS | Status: AC
  Administered 2023-09-07: 500 mL via INTRAVENOUS

## 2023-09-07 MED ORDER — LINEZOLID 600 MG/300ML IV SOLN
600.0000 mg | Freq: Once | INTRAVENOUS | Status: AC
  Administered 2023-09-07: 600 mg via INTRAVENOUS
  Filled 2023-09-07: qty 300

## 2023-09-07 MED ORDER — VANCOMYCIN HCL 2000 MG/400ML IV SOLN
2000.0000 mg | Freq: Once | INTRAVENOUS | Status: DC
  Filled 2023-09-07: qty 400

## 2023-09-07 MED ORDER — ALBUTEROL SULFATE (2.5 MG/3ML) 0.083% IN NEBU: 2.5000 mg | INHALATION_SOLUTION | RESPIRATORY_TRACT | Status: DC | PRN

## 2023-09-07 MED ORDER — ALBUMIN HUMAN 25 % IV SOLN
25.0000 g | Freq: Once | INTRAVENOUS | Status: AC
  Administered 2023-09-08: 12.5 g via INTRAVENOUS
  Filled 2023-09-07: qty 100

## 2023-09-07 MED ORDER — INSULIN ASPART 100 UNIT/ML IJ SOLN
0.0000 [IU] | INTRAMUSCULAR | Status: DC
  Administered 2023-09-08: 1 [IU] via SUBCUTANEOUS
  Administered 2023-09-09 (×2): 2 [IU] via SUBCUTANEOUS
  Administered 2023-09-09: 1 [IU] via SUBCUTANEOUS
  Administered 2023-09-09: 2 [IU] via SUBCUTANEOUS
  Administered 2023-09-09 – 2023-09-10 (×2): 1 [IU] via SUBCUTANEOUS
  Administered 2023-09-10: 2 [IU] via SUBCUTANEOUS
  Administered 2023-09-10: 1 [IU] via SUBCUTANEOUS

## 2023-09-07 NOTE — ED Notes (Signed)
 Multiple people attempted IV on patient, unable to get access. IV team consult placed

## 2023-09-07 NOTE — ED Provider Notes (Signed)
 [place] EMERGENCY DEPARTMENT AT [facility] Provider Note   CSN: [id number] Arrival date & time: 09/07/23  1529     History  Chief Complaint  Patient presents with   Wound Infection    [name] [name] is a 68 y.o. male presenting with a right foot wound that started in September and was healing well.  However, patient started noticing a worsening of the wound approximately 1.1 to 2 weeks ago.  He also notes that he was feeling more fatigued and generalized weakness over the same time.  Denies fevers at home, denies being on recent antibiotics.  Patient endorses being on Monday and Friday dialysis.  He has not missed any sessions.  He still makes limited urine daily.  Complicated history of vascular disease resulting in below-knee amputation of the left and need for revascularization on the right. He has 3 wounds on the right foot which have failed to heal. The primary wound is on the right dorsum of the foot.   PMH Significant for: Restrictive airway disease, CABG x 3, CHF, diabetes mellitus, GERD, OSA on CPAP, A-fib, shortness of breath on exertion, end-stage renal disease on dialysis (MF)  HPI     Home Medications Prior to Admission medications   Medication Sig Start Date End Date Taking? Authorizing Provider  Accu-Chek Softclix Lancets lancets 1 each by Other route daily. 05/28/23   [provider]  albuterol (VENTOLIN HFA) 108 (90 Base) MCG/ACT inhaler Inhale 2 puffs into the lungs every 6 (six) hours as needed for wheezing or shortness of breath.    [provider]  amiodarone (PACERONE) 200 MG tablet TAKE 1 TABLET TWICE DAILY 03/31/23   Baldo Daub, MD  apixaban (ELIQUIS) 5 MG TABS tablet Take 1 tablet (5 mg total) by mouth 2 (two) times daily. 02/18/23   Joseph Art, DO  atorvastatin (LIPITOR) 80 MG tablet Take 1 tablet (80 mg total) by mouth daily. 06/25/23   Baldo Daub, MD  B Complex-C-Folic Acid (DIALYVITE 800) 0.8 MG TABS Take  0.8 mg by mouth daily. 12/16/21   [provider]  Blood Glucose Monitoring Suppl (ACCU-CHEK GUIDE ME) w/Device KIT 1 Device by Other route 3 (three) times daily. 02/13/23   [provider]  collagenase (SANTYL) 250 UNIT/GM ointment Apply 1 Application topically daily. 05/05/23   Lars Mage, PA-C  Dulaglutide (TRULICITY) 3 MG/0.5ML SOPN Inject 3 mg into the skin every Wednesday.    [provider]  fluticasone furoate-vilanterol (BREO ELLIPTA) 200-25 MCG/ACT AEPB Inhale 1 puff into the lungs daily. 01/02/18   [provider]  glucose blood test strip 1 each by Other route 3 (three) times daily. 04/20/23   [provider]  insulin glargine (LANTUS) 100 UNIT/ML injection Inject 0.2 mLs (20 Units total) into the skin 2 (two) times daily. 02/12/23   Joseph Art, DO  Insulin Pen Needle 32G X 4 MM MISC Use to inject Levemir 2 (two) times daily. 12/03/21   Ardelle Balls, PA-C  midodrine (PROAMATINE) 5 MG tablet TAKE 1 TABLET EVERY DAY 06/15/23   Baldo Daub, MD  montelukast (SINGULAIR) 10 MG tablet Take 10 mg by mouth daily. 08/20/23   [provider]  torsemide (DEMADEX) 20 MG tablet TAKE 1 TABLET EVERY DAY 08/10/23   Baldo Daub, MD      Allergies    Tape    Review of Systems   Review of Systems  Physical Exam Updated Vital Signs BP  126/84 (BP Location: Left Wrist)   Pulse 89   Temp 100.3 F (37.9 C) (Oral)   Resp 19   Ht 5\' 10"  (1.778 m)   Wt 102.1 kg   SpO2 95%   BMI 32.28 kg/m  Physical Exam Vitals and nursing note reviewed.  Constitutional:      General: He is not in acute distress.    Appearance: He is ill-appearing.  Musculoskeletal:     Right lower leg: 2+ Pitting Edema present.     Left Lower Extremity: Left leg is amputated below knee.  Neurological:     Mental Status: He is alert.  Psychiatric:        Behavior: Behavior is cooperative.          ED Results / Procedures / Treatments    Labs (all labs ordered are listed, but only abnormal results are displayed) Labs Reviewed  COMPREHENSIVE METABOLIC PANEL - Abnormal; Notable for the following components:      Result Value   Potassium 2.5 (*)    Chloride 96 (*)    Glucose, Bld 125 (*)    Creatinine, Ser 3.52 (*)    Calcium 7.6 (*)    Albumin 1.7 (*)    AST 79 (*)    ALT 49 (*)    GFR, Estimated 18 (*)    All other components within normal limits  CBC WITH DIFFERENTIAL/PLATELET - Abnormal; Notable for the following components:   WBC 27.9 (*)    RBC 3.26 (*)    Hemoglobin 8.0 (*)    HCT 26.4 (*)    MCH 24.5 (*)    RDW 18.5 (*)    Platelets 550 (*)    Neutro Abs 25.9 (*)    Lymphs Abs 0.0 (*)    Monocytes Absolute 1.7 (*)    Basophils Absolute 0.3 (*)    All other components within normal limits  PROTIME-INR - Abnormal; Notable for the following components:   Prothrombin Time 34.1 (*)    INR 3.3 (*)    All other components within normal limits  APTT - Abnormal; Notable for the following components:   aPTT 46 (*)    All other components within normal limits  BRAIN NATRIURETIC PEPTIDE - Abnormal; Notable for the following components:   B Natriuretic Peptide 484.4 (*)    All other components within normal limits  I-STAT CG4 LACTIC ACID, ED - Abnormal; Notable for the following components:   Lactic Acid, Venous 2.5 (*)    All other components within normal limits  RESP PANEL BY RT-PCR (RSV, FLU A&B, COVID)  RVPGX2  CULTURE, BLOOD (ROUTINE X 2)  CULTURE, BLOOD (ROUTINE X 2)  URINALYSIS, W/ REFLEX TO CULTURE (INFECTION SUSPECTED)  I-STAT CG4 LACTIC ACID, ED    EKG None  Radiology DG Foot Complete Right Result Date: 09/07/2023 CLINICAL DATA:  Wound EXAM: RIGHT FOOT COMPLETE - 3+ VIEW COMPARISON:  04/27/2020 FINDINGS: Frontal, oblique, lateral views of the right foot are obtained. There is diffuse soft tissue edema, with extensive subcutaneous gas throughout the forefoot compatible with gas-forming  infection. There is bone destruction along the first metatarsal head, base of the first proximal phalanx, and fifth metatarsal head consistent with osteomyelitis. There are no acute fractures. Diffuse osteoarthritis. Extensive atherosclerosis. IMPRESSION: 1. Diffuse soft tissue edema compatible with cellulitis. Extensive subcutaneous gas throughout the forefoot consistent with gas forming infection. 2. Destructive changes surrounding the first metatarsophalangeal joint consistent with osteomyelitis and likely septic arthritis. Additional bony destruction of the fifth  metatarsal head consistent with osteomyelitis. Electronically Signed   By: Sharlet Salina M.D.   On: 09/07/2023 17:13   DG Chest 2 View Result Date: 09/07/2023 CLINICAL DATA:  Systemic inflammatory response syndrome. EXAM: CHEST - 2 VIEW COMPARISON:  One-view x-ray 02/05/2023 FINDINGS: Status post median sternotomy. Stable cardiopericardial silhouette enlargement. Slight prominence of central vasculature. No pneumothorax or effusion. X-rays are limited. The upper aspect of the chest is clipped off the edge of the film on the frontal view and the inferior costophrenic angles are clipped on the lateral. Is also masslike opacity left perihilar. Underlying lesion is possible. Recommend follow-up chest CT with contrast when appropriate. IMPRESSION: Limited x-rays.  Postop chest. New masslike opacity along the suprahilar region on the left. Recommend a follow up chest CT with contrast to further delineate and exclude underlying neoplasm or other aggressive lesion when appropriate. Electronically Signed   By: Karen Kays M.D.   On: 09/07/2023 17:13    Procedures Procedures    Medications Ordered in ED Medications  lactated ringers infusion (has no administration in time range)  lactated ringers bolus 500 mL (500 mLs Intravenous New Bag/Given 09/07/23 2154)  linezolid (ZYVOX) IVPB 600 mg (600 mg Intravenous New Bag/Given 09/07/23 2154)  potassium  chloride SA (KLOR-CON M) CR tablet 40 mEq (has no administration in time range)  magnesium oxide (MAG-OX) tablet 800 mg (has no administration in time range)  potassium chloride 10 mEq in 100 mL IVPB (has no administration in time range)  albumin human 25 % solution 25 g (has no administration in time range)  ceFEPIme (MAXIPIME) 2 g in sodium chloride 0.9 % 100 mL IVPB (0 g Intravenous Stopped 09/07/23 2031)  gadobutrol (GADAVIST) 1 MMOL/ML injection 10 mL (10 mLs Intravenous Contrast Given 09/07/23 2116)    ED Course/ Medical Decision Making/ A&P                                 Medical Decision Making Amount and/or Complexity of Data Reviewed Labs: ordered. Radiology: ordered.  Risk OTC drugs. Prescription drug management. Decision regarding hospitalization.   68 year old male with PMH Significant for: Restrictive airway disease, CABG x 3, CHF, diabetes mellitus, GERD, OSA on CPAP, A-fib, shortness of breath on exertion, end-stage renal disease on dialysis (MF) with concern for right foot infection. Concern for sepsis given fever, tachypnea and suspected source of infection of right foot wound.  Will order blood cultures x 2, labs.  Will defer 30 cc/kg given that patient is ESRD and appears fluid overloaded with 2+ pitting edema of right lower extremity.  Patient has not missed any dialysis sessions.  Given concern for sepsis will administer 500 cc and placed on hourly infusion of 150 cc/h.  CXR shows masslike opacity on the left suprahilar region.  Right foot x-ray shows extensive subcutaneous gas consistent with gas-forming infection as well as diffuse soft tissue edema with cellulitis.  Imaging is concerning for necrotizing fasciitis.  On discussion with pharmacist, antibiotic choice is linezolid (to limit toxin production and as clindamycin has resistance in this region) and cefepime.  Initially consulted orthopedics (Dr. Pecola Leisure) for management, however vascular surgery, Dr. Lenell Antu, has  been following patient closely and has conducted left lower extremity amputation and been following for right lower extremity wound.  Discussed the patient with vascular surgery who is planning for amputation tomorrow.  MRI foot and ankle were ordered per request for surgical planning.  Labs  are notable for leukocytosis with WBC 28.  This further supports patient's diagnosis of sepsis secondary to right foot infection.  Hemoglobin is 8.0, this is patient's baseline.  CMP is notable for hypokalemia of 2.5-initiated IV and p.o. replacement.  Creatinine is 3.52 which is appropriate for patient with ESRD.  Elevated PT and INR; patient is on Eliquis twice daily.  Normal lactic acid.  BNP slightly elevated at 484, supporting limited fluid resuscitation in the setting of sepsis.  Patient requires admission for IV antibiotics and surgical management of right foot infection concerning for sepsis and necrotizing fasciitis.  Patient was discussed with inpatient hospitalist who will be admitting patient.  Final Clinical Impression(s) / ED Diagnoses Final diagnoses:  Sepsis, due to unspecified organism, unspecified whether acute organ dysfunction present (HCC)  Necrotizing fasciitis (HCC)  Hypokalemia    Rx / DC Orders ED Discharge Orders     None      Renella Cunas, PGY 2 Emergency medicine   Renella Cunas, MD 09/08/23 9811    Glendora Score, MD 09/08/23 1245

## 2023-09-07 NOTE — ED Notes (Signed)
 Multiple RN's, phlebotomy attempted to get blood draws from patient, unsuccessful. Will start antibiotics prior to getting cultures

## 2023-09-07 NOTE — H&P (Signed)
 History and Physical    [name] [name] Plourde JJO:[id number] DOB: [birth year] DOA: 09/07/2023  PCP: [name], PA-C  Patient coming from: home  I have personally briefly reviewed patient's old medical records in [facility]  Chief Complaint: non haling right foot wound x over the last week has had had signs of progressive infection.  HPI: Brad Lieurance is a 68 y.o. male with medical history significant of  ESRD MWF,Anemia, HTN ,CAD s/p CABG, DMII,CHFref 30%,OSA, on CPAP, P Afib,  hx of severe PVD with critical limb ischemia necessitating aortogram with laser atherectomy and stenting left SFA and popliteal arteries on 12/29/2022 by Dr. Randie Heinz for chronic LLE threatening ischemia with toe ulceration then later left BKA on 01/26/2023 by Dr. Lenell Antu. Patient case further complicated by development of right heal ulcer  9/24 leading to Right common femoral to below-knee popliteal artery bypass on  03/19/2023. Since then patient has had difficulty with healing of both his left BKA wound as well as non-healing ulcer on right  heal. Patient over the last week states that he has noted progression of wound with foul smell emanating of the nonhealing wound on his right foot. Due to this patient presented to ED for further evaluation in referral for HD.   ED Course:  IN ED patient was evaluated by vascular Dr Sherral Hammers who noted that patient had severe necrotic changes to the right lower extremity . As well as noted nonhealing below the knee amputation on the left with tibia open to air.  Vitals: Tmx 100.74m BP 152/49, hr 85, rr 20 sat 96%  Labs lactic 2.5 Cxr IMPRESSION: Limited x-rays.  Postop chest.   New masslike opacity along the suprahilar region on the left. Recommend a follow up chest CT with contrast to further delineate and exclude underlying neoplasm or other aggressive lesion when appropriate.  Right foot xray IMPRESSION: 1. Diffuse soft tissue edema compatible with cellulitis.  Extensive subcutaneous gas throughout the forefoot consistent with gas forming infection. 2. Destructive changes surrounding the first metatarsophalangeal joint consistent with osteomyelitis and likely septic arthritis. Additional bony destruction of the fifth metatarsal head consistent with osteomyelitis.   Tx cefepime,zyvox  Review of Systems: As per HPI otherwise 10 point review of systems negative.   Past Medical History:  Diagnosis Date   Acute kidney failure, unspecified (HCC) 12/05/2021   Anemia 04/17/2022   Blood transfusion   Atherosclerosis of native arteries of extremities with rest pain, right leg (HCC) 03/23/2023   Atherosclerotic heart disease    Benign essential HTN 07/30/2014   Bradycardia 01/25/2015   Cardiomyopathy, unspecified (HCC) 12/03/2021   CHF (congestive heart failure) (HCC)    Chronic anticoagulation 02/25/2018   Chronic diastolic CHF (congestive heart failure) (HCC) 07/15/2021   Chronic systolic CHF (congestive heart failure), NYHA class 3 (HCC) 08/30/2014   Overview:  Global ef 30%   CKD (chronic kidney disease)    Class 3 severe obesity in adult [facility]) 04/28/2017   Coronary artery disease    Critical limb ischemia of right lower extremity (HCC) 03/19/2023   Depression    Diabetes mellitus without complication (HCC)    Diabetic neuropathy (HCC) 07/28/2014   Dysrhythmia    Erectile dysfunction    GERD (gastroesophageal reflux disease)    High risk medication use 05/26/2018   Hypertensive heart disease with heart failure (HCC) 07/30/2014   Leukocytosis 11/26/2021   Limb ischemia 01/26/2023   Lumbago    LV dysfunction 04/28/2017   Mixed hyperlipidemia 07/28/2014  OSA (obstructive sleep apnea)    CPAP   PAD (peripheral artery disease) (HCC) 01/26/2023   Paroxysmal atrial fibrillation (HCC) 07/28/2014   Peripheral arterial disease (HCC)    PFO (patent foramen ovale)    Postop check 01/06/2022   S/P CABG x 3 11/12/2021   Sinus node  dysfunction (HCC) 04/21/2016   Testicular hypofunction    Uncontrolled type 2 diabetes mellitus with microalbuminuric diabetic nephropathy 07/28/2014    Past Surgical History:  Procedure Laterality Date   ABDOMINAL AORTOGRAM W/LOWER EXTREMITY N/A 12/29/2022   Procedure: ABDOMINAL AORTOGRAM W/LOWER EXTREMITY;  Surgeon: Maeola Harman, MD;  Location: [facility] INVASIVE CV LAB;  Service: Cardiovascular;  Laterality: N/A;   ABDOMINAL AORTOGRAM W/LOWER EXTREMITY N/A 01/22/2023   Procedure: ABDOMINAL AORTOGRAM W/LOWER EXTREMITY;  Surgeon: [name], MD;  Location: [facility] INVASIVE CV LAB;  Service: Cardiovascular;  Laterality: N/A;   ABDOMINAL AORTOGRAM W/LOWER EXTREMITY N/A 03/06/2023   Procedure: ABDOMINAL AORTOGRAM W/LOWER EXTREMITY;  Surgeon: Leonie Karver, MD;  Location: [facility] INVASIVE CV LAB;  Service: Cardiovascular;  Laterality: N/A;   AMPUTATION Left 01/26/2023   Procedure: LEFT AMPUTATION BELOW KNEE;  Surgeon: Leonie Ezekiel, MD;  Location: [facility] OR;  Service: Vascular;  Laterality: Left;  with regional block   APPLICATION OF WOUND VAC Left 01/26/2023   Procedure: APPLICATION OF WOUND VAC, LEFT BELOW THE KNEE AMPUTATION STUMP;  Surgeon: Leonie Marvyn, MD;  Location: [facility] OR;  Service: Vascular;  Laterality: Left;  With regional block   APPLICATION OF WOUND VAC Left 02/10/2023   Procedure: APPLICATION OF WOUND VAC;  Surgeon: Chuck Hint, MD;  Location: [facility] OR;  Service: Vascular;  Laterality: Left;   AV FISTULA PLACEMENT Right 05/20/2022   Procedure: RIGHT ARM ARTERIOVENOUS (AV) FISTULA CREATION;  Surgeon: Larina Earthly, MD;  Location: [facility] OR;  Service: Vascular;  Laterality: Right;   BUBBLE STUDY  11/21/2021   Procedure: BUBBLE STUDY;  Surgeon: Laurey Morale, MD;  Location: [facility] ENDOSCOPY;  Service: Cardiovascular;;   CARDIAC CATHETERIZATION     CARDIOVERSION N/A 11/21/2021   Procedure: CARDIOVERSION;  Surgeon: Laurey Morale, MD;  Location: [facility] ENDOSCOPY;  Service:  Cardiovascular;  Laterality: N/A;   CARDIOVERSION N/A 11/25/2021   Procedure: CARDIOVERSION;  Surgeon: Laurey Morale, MD;  Location: [facility] ENDOSCOPY;  Service: Cardiovascular;  Laterality: N/A;   CARDIOVERSION N/A 11/29/2021   Procedure: CARDIOVERSION;  Surgeon: Laurey Morale, MD;  Location: [facility] ENDOSCOPY;  Service: Cardiovascular;  Laterality: N/A;   CARDIOVERSION N/A 02/27/2022   Procedure: CARDIOVERSION;  Surgeon: Little Ishikawa, MD;  Location: [facility] ENDOSCOPY;  Service: Cardiovascular;  Laterality: N/A;   CORONARY ARTERY BYPASS GRAFT N/A 11/12/2021   Procedure: CORONARY ARTERY BYPASS GRAFTING (CABG) x  3 USING LEFT INTERNAL MAMMARY ARTERY AND RIGHT GREATER SAPHENOUS VEIN;  Surgeon: Lovett Sox, MD;  Location: [facility] OR;  Service: Open Heart Surgery;  Laterality: N/A;   ELECTROPHYSIOLOGIC STUDY N/A 01/18/2015   Procedure: CARDIOVERSION;  Surgeon: Lamar Blinks, MD;  Location: [facility] ORS;  Service: Cardiovascular;  Laterality: N/A;   ENDOVEIN HARVEST OF GREATER SAPHENOUS VEIN  11/12/2021   Procedure: ENDOVEIN HARVEST OF GREATER SAPHENOUS VEIN;  Surgeon: Lovett Sox, MD;  Location: [facility] OR;  Service: Open Heart Surgery;;   EYE SURGERY Bilateral 2022   FEMORAL-POPLITEAL BYPASS GRAFT Right 03/19/2023   Procedure: RIGHT COMMON FEMORAL-BELOW KNEE POPLITEAL ARTERY BYPASS;  Surgeon: Leonie Juvencio, MD;  Location: [facility] OR;  Service: Vascular;  Laterality: Right;   FISTULA SUPERFICIALIZATION Right 07/10/2022  Procedure: RIGHT ARM ARTERIOVENOUS FISTULA SUPERFICIALIZATION;  Surgeon: Maeola Harman, MD;  Location: [facility] OR;  Service: Vascular;  Laterality: Right;   IR FLUORO GUIDE CV LINE RIGHT  11/28/2021   IR US GUIDE VASC ACCESS RIGHT  11/28/2021   LEFT HEART CATH AND CORONARY ANGIOGRAPHY N/A 10/24/2021   Procedure: LEFT HEART CATH AND CORONARY ANGIOGRAPHY;  Surgeon: Corky Crafts, MD;  Location: [facility] INVASIVE CV LAB;  Service: Cardiovascular;  Laterality: N/A;   MAZE N/A  11/12/2021   Procedure: MAZE;  Surgeon: Lovett Sox, MD;  Location: [facility] OR;  Service: Open Heart Surgery;  Laterality: N/A;   PERIPHERAL VASCULAR ATHERECTOMY  12/29/2022   Procedure: PERIPHERAL VASCULAR ATHERECTOMY;  Surgeon: Maeola Harman, MD;  Location: [facility] INVASIVE CV LAB;  Service: Cardiovascular;;   PERIPHERAL VASCULAR INTERVENTION  12/29/2022   Procedure: PERIPHERAL VASCULAR INTERVENTION;  Surgeon: Maeola Harman, MD;  Location: [facility] INVASIVE CV LAB;  Service: Cardiovascular;;  Left SFA, Bilateral illiac   PERIPHERAL VASCULAR INTERVENTION Right 03/06/2023   Procedure: PERIPHERAL VASCULAR INTERVENTION;  Surgeon: Leonie Hayden, MD;  Location: [facility] INVASIVE CV LAB;  Service: Cardiovascular;  Laterality: Right;  SFA   STUMP REVISION Left 02/10/2023   Procedure: LEFT BELOW KNEE AMPUTATION REVISION;  Surgeon: Chuck Hint, MD;  Location: [facility] OR;  Service: Vascular;  Laterality: Left;   TEE WITHOUT CARDIOVERSION N/A 11/12/2021   Procedure: TRANSESOPHAGEAL ECHOCARDIOGRAM (TEE);  Surgeon: Lovett Sox, MD;  Location: [facility] OR;  Service: Open Heart Surgery;  Laterality: N/A;   TEE WITHOUT CARDIOVERSION N/A 11/21/2021   Procedure: TRANSESOPHAGEAL ECHOCARDIOGRAM (TEE);  Surgeon: Laurey Morale, MD;  Location: [facility] ENDOSCOPY;  Service: Cardiovascular;  Laterality: N/A;     reports that he quit smoking about 18 years ago. His smoking use included cigarettes. He has never used smokeless tobacco. He reports current drug use. Frequency: 3.00 times per week. Drug: Marijuana. He reports that he does not drink alcohol.  Allergies  Allergen Reactions   Tape     Plastic tape -needs paper tape    Family History  Problem Relation Age of Onset   CAD Brother    Drug abuse Brother    Lung cancer Father    CAD Mother     Prior to Admission medications   Medication Sig Start Date End Date Taking? Authorizing Provider  Accu-Chek Softclix Lancets lancets 1 each by Other route  daily. 05/28/23   [provider]  albuterol (VENTOLIN HFA) 108 (90 Base) MCG/ACT inhaler Inhale 2 puffs into the lungs every 6 (six) hours as needed for wheezing or shortness of breath.    [provider]  amiodarone (PACERONE) 200 MG tablet TAKE 1 TABLET TWICE DAILY 03/31/23   Baldo Daub, MD  apixaban (ELIQUIS) 5 MG TABS tablet Take 1 tablet (5 mg total) by mouth 2 (two) times daily. 02/18/23   Joseph Art, DO  atorvastatin (LIPITOR) 80 MG tablet Take 1 tablet (80 mg total) by mouth daily. 06/25/23   Baldo Daub, MD  B Complex-C-Folic Acid (DIALYVITE 800) 0.8 MG TABS Take 0.8 mg by mouth daily. 12/16/21   [provider]  Blood Glucose Monitoring Suppl (ACCU-CHEK GUIDE ME) w/Device KIT 1 Device by Other route 3 (three) times daily. 02/13/23   [provider]  collagenase (SANTYL) 250 UNIT/GM ointment Apply 1 Application topically daily. 05/05/23   Lars Mage, PA-C  Dulaglutide (TRULICITY) 3 MG/0.5ML SOPN Inject 3 mg into the skin every Wednesday.    [provider]  fluticasone furoate-vilanterol (BREO ELLIPTA) 200-25 MCG/ACT AEPB Inhale 1 puff into the lungs daily. 01/02/18   [provider]  glucose blood test strip 1 each by Other route 3 (three) times daily. 04/20/23   [provider]  insulin glargine (LANTUS) 100 UNIT/ML injection Inject 0.2 mLs (20 Units total) into the skin 2 (two) times daily. 02/12/23   Joseph Art, DO  Insulin Pen Needle 32G X 4 MM MISC Use to inject Levemir 2 (two) times daily. 12/03/21   Ardelle Balls, PA-C  midodrine (PROAMATINE) 5 MG tablet TAKE 1 TABLET EVERY DAY 06/15/23   Baldo Daub, MD  montelukast (SINGULAIR) 10 MG tablet Take 10 mg by mouth daily. 08/20/23   [provider]  torsemide (DEMADEX) 20 MG tablet TAKE 1 TABLET EVERY DAY 08/10/23   Baldo Daub, MD    Physical Exam: Vitals:   09/07/23 1542 09/07/23 1700 09/07/23 1745 09/07/23 1943  BP:  (!) 136/40  (!) 140/48 126/84  Pulse:  80 75 89  Resp:  (!) 22 (!) 25 19  Temp:    100.3 F (37.9 C)  TempSrc:    Oral  SpO2:  97% 95% 95%  Weight: 102.1 kg     Height: 5\' 10"  (1.778 m)       Constitutional: NAD, calm, comfortable Vitals:   09/07/23 1542 09/07/23 1700 09/07/23 1745 09/07/23 1943  BP:  (!) 136/40 (!) 140/48 126/84  Pulse:  80 75 89  Resp:  (!) 22 (!) 25 19  Temp:    100.3 F (37.9 C)  TempSrc:    Oral  SpO2:  97% 95% 95%  Weight: 102.1 kg     Height: 5\' 10"  (1.778 m)      Eyes: PERRL, lids and conjunctivae normal ENMT: Mucous membranes are moist. Posterior pharynx clear of any exudate or lesions.Normal dentition.  Neck: normal, supple, no masses, no thyromegaly Respiratory: clear to auscultation bilaterally, no wheezing, no crackles. Normal respiratory effort. No accessory muscle use.  Cardiovascular: Regular rate and rhythm, no murmurs / rubs / gallops. No extremity edema. Warm extremity Abdomen: no tenderness, no masses palpated. No hepatosplenomegaly. Bowel sounds positive.  Musculoskeletal: bka on the right with small part of tibia exposed.  joint deformity upper and lower extremities.  Good ROM, no contractures. Normal muscle tone.  Skin: necrotic changed right heal , non healing left bka stump Neurologic: CN 2-12 grossly intact. Sensation intact,. MAE x4.  Psychiatric: poor judgment and insight attempted to sign out ama but was able to be re-directed , Alert and oriented x 3. Normal mood.    Labs on Admission: I have personally reviewed following labs and imaging studies  CBC: Recent Labs  Lab 09/07/23 1936  WBC 27.9*  NEUTROABS 25.9*  HGB 8.0*  HCT 26.4*  MCV 81.0  PLT 550*   Basic Metabolic Panel: Recent Labs  Lab 09/07/23 1936  NA 139  K 2.5*  CL 96*  CO2 30  GLUCOSE 125*  BUN 16  CREATININE 3.52*  CALCIUM 7.6*   GFR: Estimated Creatinine Clearance: 23.7 mL/min (A) (by C-G formula based on SCr of 3.52 mg/dL (H)). Liver Function  Tests: Recent Labs  Lab 09/07/23 1936  AST 79*  ALT 49*  ALKPHOS 71  BILITOT 0.9  PROT 7.2  ALBUMIN 1.7*   No results for input(s): "LIPASE", "AMYLASE" in the last 168 hours. No results for input(s): "AMMONIA" in the last 168 hours. Coagulation Profile: Recent Labs  Lab  09/07/23 1936  INR 3.3*   Cardiac Enzymes: No results for input(s): "CKTOTAL", "CKMB", "CKMBINDEX", "TROPONINI" in the last 168 hours. BNP (last 3 results) No results for input(s): "PROBNP" in the last 8760 hours. HbA1C: No results for input(s): "HGBA1C" in the last 72 hours. CBG: No results for input(s): "GLUCAP" in the last 168 hours. Lipid Profile: No results for input(s): "CHOL", "HDL", "LDLCALC", "TRIG", "CHOLHDL", "LDLDIRECT" in the last 72 hours. Thyroid Function Tests: No results for input(s): "TSH", "T4TOTAL", "FREET4", "T3FREE", "THYROIDAB" in the last 72 hours. Anemia Panel: No results for input(s): "VITAMINB12", "FOLATE", "FERRITIN", "TIBC", "IRON", "RETICCTPCT" in the last 72 hours. Urine analysis:    Component Value Date/Time   COLORURINE STRAW (A) 11/08/2021 1110   APPEARANCEUR CLEAR 11/08/2021 1110   LABSPEC 1.023 11/08/2021 1110   PHURINE 5.0 11/08/2021 1110   GLUCOSEU >=500 (A) 11/08/2021 1110   HGBUR NEGATIVE 11/08/2021 1110   BILIRUBINUR NEGATIVE 11/08/2021 1110   KETONESUR 5 (A) 11/08/2021 1110   PROTEINUR NEGATIVE 11/08/2021 1110   NITRITE NEGATIVE 11/08/2021 1110   LEUKOCYTESUR TRACE (A) 11/08/2021 1110    Radiological Exams on Admission: DG Foot Complete Right Result Date: 09/07/2023 CLINICAL DATA:  Wound EXAM: RIGHT FOOT COMPLETE - 3+ VIEW COMPARISON:  04/27/2020 FINDINGS: Frontal, oblique, lateral views of the right foot are obtained. There is diffuse soft tissue edema, with extensive subcutaneous gas throughout the forefoot compatible with gas-forming infection. There is bone destruction along the first metatarsal head, base of the first proximal phalanx, and fifth  metatarsal head consistent with osteomyelitis. There are no acute fractures. Diffuse osteoarthritis. Extensive atherosclerosis. IMPRESSION: 1. Diffuse soft tissue edema compatible with cellulitis. Extensive subcutaneous gas throughout the forefoot consistent with gas forming infection. 2. Destructive changes surrounding the first metatarsophalangeal joint consistent with osteomyelitis and likely septic arthritis. Additional bony destruction of the fifth metatarsal head consistent with osteomyelitis. Electronically Signed   By: Sharlet Salina M.D.   On: 09/07/2023 17:13   DG Chest 2 View Result Date: 09/07/2023 CLINICAL DATA:  Systemic inflammatory response syndrome. EXAM: CHEST - 2 VIEW COMPARISON:  One-view x-ray 02/05/2023 FINDINGS: Status post median sternotomy. Stable cardiopericardial silhouette enlargement. Slight prominence of central vasculature. No pneumothorax or effusion. X-rays are limited. The upper aspect of the chest is clipped off the edge of the film on the frontal view and the inferior costophrenic angles are clipped on the lateral. Is also masslike opacity left perihilar. Underlying lesion is possible. Recommend follow-up chest CT with contrast when appropriate. IMPRESSION: Limited x-rays.  Postop chest. New masslike opacity along the suprahilar region on the left. Recommend a follow up chest CT with contrast to further delineate and exclude underlying neoplasm or other aggressive lesion when appropriate. Electronically Signed   By: Karen Kays M.D.   On: 09/07/2023 17:13    EKG: Independently reviewed.   Assessment/Plan   Nonhealing right lower extremity ulcer with progressive gangrene  -hx of severe pvd with critical limb ischemia and multiple interventions as noted above -admit to med tele  -continue on zyvox and cefepime  -eliquis on hold for planned procedure  -MRI penidng - npo midnight for surgical intervention with Dr Sherral Hammers  -f/u further vascular  recommendations   ESRD MWF -renal consult for HD  -on midodrine daily   Anemia -at baseline   History of hypotension  -on midodrine  -bp currently stable   CAD s/p CABG -no active issues  -resume home regimen   DMII -iss/fs   CHFref  -fluid management by  HD   OSA -cpap at bedtime   P Afib  -continue on amiodarone -eliquis on hold for planned procedure   COPD -continue on breo    DVT prophylaxis: holding eliquis Code Status: full/ as discussed per patient wishes in event of cardiac arrest  Family Communication: none at bedside Disposition Plan: patient  expected to be admitted greater than 2 midnights  Consults called: patient  expected to be admitted greater than 2 midnights  Admission status: progressive   Lurline Del MD [facility]   If 7PM-7AM, please contact night-coverage [email or web address] Password [facility]  09/07/2023, 9:35 PM

## 2023-09-07 NOTE — Telephone Encounter (Signed)
 Returned Janice's call regarding pt's wound looking worse. She stated this started sometime last week. She has been offered an appt today but states pt's currently at HD and she has no idea how late in the day he will be done. When I called her back after speaking with APP, Liborio Nixon said she received a call from him at HD and the MD there saw him and has advised they go to the ED after he completes HD later today. She is in agreement.

## 2023-09-07 NOTE — Consult Note (Addendum)
 Hospital Consult    Reason for Consult: Gangrenous changes to the right foot Requesting Physician: Emergency department MRN #:  [id number]  History of Present Illness: This is a 68 y.o. male well-known to the vascular surgery service line having previously undergone a common femoral to below-knee popliteal artery bypass using 6 mm PTFE CLI 5 by my partner Dr. Lenell Antu.  This was completed in September of last year.  He was last seen in the office in January with continued wounds to the right lower extremity.  Patient also has left-sided BKA stump which has had difficulty with wound healing.  Exam, [name] [name] was resting comfortably.  He denied fevers, chills.  Stated that he is always cold after dialysis. He stated over the last several weeks, his foot has worsened.  He had to the bottom of his foot on his truck, which he thinks is spurred current problem.  He has noted a foul smell from the foot, which is the main reason he came in today.  Past Medical History:  Diagnosis Date   Acute kidney failure, unspecified (HCC) 12/05/2021   Anemia 04/17/2022   Blood transfusion   Atherosclerosis of native arteries of extremities with rest pain, right leg (HCC) 03/23/2023   Atherosclerotic heart disease    Benign essential HTN 07/30/2014   Bradycardia 01/25/2015   Cardiomyopathy, unspecified (HCC) 12/03/2021   CHF (congestive heart failure) (HCC)    Chronic anticoagulation 02/25/2018   Chronic diastolic CHF (congestive heart failure) (HCC) 07/15/2021   Chronic systolic CHF (congestive heart failure), NYHA class 3 (HCC) 08/30/2014   Overview:  Global ef 30%   CKD (chronic kidney disease)    Class 3 severe obesity in adult [facility]) 04/28/2017   Coronary artery disease    Critical limb ischemia of right lower extremity (HCC) 03/19/2023   Depression    Diabetes mellitus without complication (HCC)    Diabetic neuropathy (HCC) 07/28/2014   Dysrhythmia    Erectile dysfunction    GERD (gastroesophageal  reflux disease)    High risk medication use 05/26/2018   Hypertensive heart disease with heart failure (HCC) 07/30/2014   Leukocytosis 11/26/2021   Limb ischemia 01/26/2023   Lumbago    LV dysfunction 04/28/2017   Mixed hyperlipidemia 07/28/2014   OSA (obstructive sleep apnea)    CPAP   PAD (peripheral artery disease) (HCC) 01/26/2023   Paroxysmal atrial fibrillation (HCC) 07/28/2014   Peripheral arterial disease (HCC)    PFO (patent foramen ovale)    Postop check 01/06/2022   S/P CABG x 3 11/12/2021   Sinus node dysfunction (HCC) 04/21/2016   Testicular hypofunction    Uncontrolled type 2 diabetes mellitus with microalbuminuric diabetic nephropathy 07/28/2014    Past Surgical History:  Procedure Laterality Date   ABDOMINAL AORTOGRAM W/LOWER EXTREMITY N/A 12/29/2022   Procedure: ABDOMINAL AORTOGRAM W/LOWER EXTREMITY;  Surgeon: Maeola Harman, MD;  Location: [facility] INVASIVE CV LAB;  Service: Cardiovascular;  Laterality: N/A;   ABDOMINAL AORTOGRAM W/LOWER EXTREMITY N/A 01/22/2023   Procedure: ABDOMINAL AORTOGRAM W/LOWER EXTREMITY;  Surgeon: [name], MD;  Location: [facility] INVASIVE CV LAB;  Service: Cardiovascular;  Laterality: N/A;   ABDOMINAL AORTOGRAM W/LOWER EXTREMITY N/A 03/06/2023   Procedure: ABDOMINAL AORTOGRAM W/LOWER EXTREMITY;  Surgeon: Leonie Aqil, MD;  Location: [facility] INVASIVE CV LAB;  Service: Cardiovascular;  Laterality: N/A;   AMPUTATION Left 01/26/2023   Procedure: LEFT AMPUTATION BELOW KNEE;  Surgeon: Leonie Tavaughn, MD;  Location: [facility] OR;  Service: Vascular;  Laterality: Left;  with regional block  APPLICATION OF WOUND VAC Left 01/26/2023   Procedure: APPLICATION OF WOUND VAC, LEFT BELOW THE KNEE AMPUTATION STUMP;  Surgeon: Leonie Izick, MD;  Location: [facility] OR;  Service: Vascular;  Laterality: Left;  With regional block   APPLICATION OF WOUND VAC Left 02/10/2023   Procedure: APPLICATION OF WOUND VAC;  Surgeon: Chuck Hint, MD;  Location: [facility] OR;   Service: Vascular;  Laterality: Left;   AV FISTULA PLACEMENT Right 05/20/2022   Procedure: RIGHT ARM ARTERIOVENOUS (AV) FISTULA CREATION;  Surgeon: Larina Earthly, MD;  Location: [facility] OR;  Service: Vascular;  Laterality: Right;   BUBBLE STUDY  11/21/2021   Procedure: BUBBLE STUDY;  Surgeon: Laurey Morale, MD;  Location: [facility] ENDOSCOPY;  Service: Cardiovascular;;   CARDIAC CATHETERIZATION     CARDIOVERSION N/A 11/21/2021   Procedure: CARDIOVERSION;  Surgeon: Laurey Morale, MD;  Location: [facility] ENDOSCOPY;  Service: Cardiovascular;  Laterality: N/A;   CARDIOVERSION N/A 11/25/2021   Procedure: CARDIOVERSION;  Surgeon: Laurey Morale, MD;  Location: [facility] ENDOSCOPY;  Service: Cardiovascular;  Laterality: N/A;   CARDIOVERSION N/A 11/29/2021   Procedure: CARDIOVERSION;  Surgeon: Laurey Morale, MD;  Location: [facility] ENDOSCOPY;  Service: Cardiovascular;  Laterality: N/A;   CARDIOVERSION N/A 02/27/2022   Procedure: CARDIOVERSION;  Surgeon: Little Ishikawa, MD;  Location: [facility] ENDOSCOPY;  Service: Cardiovascular;  Laterality: N/A;   CORONARY ARTERY BYPASS GRAFT N/A 11/12/2021   Procedure: CORONARY ARTERY BYPASS GRAFTING (CABG) x  3 USING LEFT INTERNAL MAMMARY ARTERY AND RIGHT GREATER SAPHENOUS VEIN;  Surgeon: Lovett Sox, MD;  Location: [facility] OR;  Service: Open Heart Surgery;  Laterality: N/A;   ELECTROPHYSIOLOGIC STUDY N/A 01/18/2015   Procedure: CARDIOVERSION;  Surgeon: Lamar Blinks, MD;  Location: [facility] ORS;  Service: Cardiovascular;  Laterality: N/A;   ENDOVEIN HARVEST OF GREATER SAPHENOUS VEIN  11/12/2021   Procedure: ENDOVEIN HARVEST OF GREATER SAPHENOUS VEIN;  Surgeon: Lovett Sox, MD;  Location: [facility] OR;  Service: Open Heart Surgery;;   EYE SURGERY Bilateral 2022   FEMORAL-POPLITEAL BYPASS GRAFT Right 03/19/2023   Procedure: RIGHT COMMON FEMORAL-BELOW KNEE POPLITEAL ARTERY BYPASS;  Surgeon: Leonie Duc, MD;  Location: [facility] OR;  Service: Vascular;  Laterality: Right;   FISTULA  SUPERFICIALIZATION Right 07/10/2022   Procedure: RIGHT ARM ARTERIOVENOUS FISTULA SUPERFICIALIZATION;  Surgeon: Maeola Harman, MD;  Location: [facility] OR;  Service: Vascular;  Laterality: Right;   IR FLUORO GUIDE CV LINE RIGHT  11/28/2021   IR US GUIDE VASC ACCESS RIGHT  11/28/2021   LEFT HEART CATH AND CORONARY ANGIOGRAPHY N/A 10/24/2021   Procedure: LEFT HEART CATH AND CORONARY ANGIOGRAPHY;  Surgeon: Corky Crafts, MD;  Location: [facility] INVASIVE CV LAB;  Service: Cardiovascular;  Laterality: N/A;   MAZE N/A 11/12/2021   Procedure: MAZE;  Surgeon: Lovett Sox, MD;  Location: [facility] OR;  Service: Open Heart Surgery;  Laterality: N/A;   PERIPHERAL VASCULAR ATHERECTOMY  12/29/2022   Procedure: PERIPHERAL VASCULAR ATHERECTOMY;  Surgeon: Maeola Harman, MD;  Location: [facility] INVASIVE CV LAB;  Service: Cardiovascular;;   PERIPHERAL VASCULAR INTERVENTION  12/29/2022   Procedure: PERIPHERAL VASCULAR INTERVENTION;  Surgeon: Maeola Harman, MD;  Location: [facility] INVASIVE CV LAB;  Service: Cardiovascular;;  Left SFA, Bilateral illiac   PERIPHERAL VASCULAR INTERVENTION Right 03/06/2023   Procedure: PERIPHERAL VASCULAR INTERVENTION;  Surgeon: Leonie Jager, MD;  Location: [facility] INVASIVE CV LAB;  Service: Cardiovascular;  Laterality: Right;  SFA   STUMP REVISION Left 02/10/2023   Procedure: LEFT BELOW KNEE AMPUTATION REVISION;  Surgeon: Chuck Hint, MD;  Location: [facility] OR;  Service: Vascular;  Laterality: Left;   TEE WITHOUT CARDIOVERSION N/A 11/12/2021   Procedure: TRANSESOPHAGEAL ECHOCARDIOGRAM (TEE);  Surgeon: Lovett Sox, MD;  Location: [facility] OR;  Service: Open Heart Surgery;  Laterality: N/A;   TEE WITHOUT CARDIOVERSION N/A 11/21/2021   Procedure: TRANSESOPHAGEAL ECHOCARDIOGRAM (TEE);  Surgeon: Laurey Morale, MD;  Location: [facility] ENDOSCOPY;  Service: Cardiovascular;  Laterality: N/A;    Allergies  Allergen Reactions   Tape     Plastic tape -needs paper tape    Prior to  Admission medications   Medication Sig Start Date End Date Taking? Authorizing Provider  Accu-Chek Softclix Lancets lancets 1 each by Other route daily. 05/28/23   [provider]  albuterol (VENTOLIN HFA) 108 (90 Base) MCG/ACT inhaler Inhale 2 puffs into the lungs every 6 (six) hours as needed for wheezing or shortness of breath.    [provider]  amiodarone (PACERONE) 200 MG tablet TAKE 1 TABLET TWICE DAILY 03/31/23   Baldo Daub, MD  apixaban (ELIQUIS) 5 MG TABS tablet Take 1 tablet (5 mg total) by mouth 2 (two) times daily. 02/18/23   Joseph Art, DO  atorvastatin (LIPITOR) 80 MG tablet Take 1 tablet (80 mg total) by mouth daily. 06/25/23   Baldo Daub, MD  B Complex-C-Folic Acid (DIALYVITE 800) 0.8 MG TABS Take 0.8 mg by mouth daily. 12/16/21   [provider]  Blood Glucose Monitoring Suppl (ACCU-CHEK GUIDE ME) w/Device KIT 1 Device by Other route 3 (three) times daily. 02/13/23   [provider]  collagenase (SANTYL) 250 UNIT/GM ointment Apply 1 Application topically daily. 05/05/23   Lars Mage, PA-C  Dulaglutide (TRULICITY) 3 MG/0.5ML SOPN Inject 3 mg into the skin every Wednesday.    [provider]  fluticasone furoate-vilanterol (BREO ELLIPTA) 200-25 MCG/ACT AEPB Inhale 1 puff into the lungs daily. 01/02/18   [provider]  glucose blood test strip 1 each by Other route 3 (three) times daily. 04/20/23   [provider]  insulin glargine (LANTUS) 100 UNIT/ML injection Inject 0.2 mLs (20 Units total) into the skin 2 (two) times daily. 02/12/23   Joseph Art, DO  Insulin Pen Needle 32G X 4 MM MISC Use to inject Levemir 2 (two) times daily. 12/03/21   Ardelle Balls, PA-C  midodrine (PROAMATINE) 5 MG tablet TAKE 1 TABLET EVERY DAY 06/15/23   Baldo Daub, MD  montelukast (SINGULAIR) 10 MG tablet Take 10 mg by mouth daily. 08/20/23   [provider]  torsemide (DEMADEX) 20 MG tablet TAKE 1 TABLET  EVERY DAY 08/10/23   Baldo Daub, MD    Social History   Socioeconomic History   Marital status: Married    Spouse name: Not on file   Number of children: Not on file   Years of education: Not on file   Highest education level: Not on file  Occupational History   Not on file  Tobacco Use   Smoking status: Former    Current packs/day: 0.00    Types: Cigarettes    Quit date: 2007    Years since quitting: 18.2   Smokeless tobacco: Never  Vaping Use   Vaping status: Never Used  Substance and Sexual Activity   Alcohol use: No   Drug use: Yes    Frequency: 3.0 times per week    Types: Marijuana    Comment: 08/29/23 last time he smoked marijuana   Sexual  activity: Not on file  Other Topics Concern   Not on file  Social History Narrative   Not on file   Social Drivers of Health   Financial Resource Strain: Not on file  Food Insecurity: No Food Insecurity (02/05/2023)   Hunger Vital Sign    Worried About Running Out of Food in the Last Year: Never true    Ran Out of Food in the Last Year: Never true  Transportation Needs: No Transportation Needs (02/05/2023)   PRAPARE - Administrator, Civil Service (Medical): No    Lack of Transportation (Non-Medical): No  Physical Activity: Not on file  Stress: Not on file  Social Connections: Not on file  Intimate Partner Violence: Not At Risk (02/05/2023)   Humiliation, Afraid, Rape, and Kick questionnaire    Fear of Current or Ex-Partner: No    Emotionally Abused: No    Physically Abused: No    Sexually Abused: No   Family History  Problem Relation Age of Onset   CAD Brother    Drug abuse Brother    Lung cancer Father    CAD Mother     ROS: Otherwise negative unless mentioned in HPI  Physical Examination  Vitals:   09/07/23 1745 09/07/23 1943  BP: (!) 140/48 126/84  Pulse: 75 89  Resp: (!) 25 19  Temp:  100.3 F (37.9 C)  SpO2: 95% 95%   Body mass index is 32.28 kg/m.  General:  WDWN in NAD Gait:  Not observed HENT: WNL, normocephalic Pulmonary: normal non-labored breathing, without Rales, rhonchi,  wheezing Cardiac: regular Abdomen: soft, NT/ND, no masses Skin: without rashes Vascular Exam/Pulses: Nonpalpable pulses in the right foot Extremities: with ischemic changes, with Gangrene , with cellulitis; with open wounds;  With open wounds to the right lower extremity including the dorsal and plantar aspects.  The plantar aspect has devitalized muscle as appreciated on physical exam.  No crepitus in the foot, ankle, calf repitus, however devitalized tissue in the plantar aspect of the foot has air associated.  No ascending cellulitis, no lymphangitis Musculoskeletal: no muscle wasting or atrophy  Neurologic: A&O X 3;  No focal weakness or paresthesias are detected; speech is fluent/normal Psychiatric:  The pt has Normal affect. Lymph:  Unremarkable  CBC    Component Value Date/Time   WBC 27.9 (H) 09/07/2023 1936   RBC 3.26 (L) 09/07/2023 1936   HGB 8.0 (L) 09/07/2023 1936   HGB 7.6 (L) 04/17/2022 1452   HCT 26.4 (L) 09/07/2023 1936   HCT 25.3 (L) 04/17/2022 1452   PLT 550 (H) 09/07/2023 1936   PLT 351 04/17/2022 1452   MCV 81.0 09/07/2023 1936   MCV 88 04/17/2022 1452   MCH 24.5 (L) 09/07/2023 1936   MCHC 30.3 09/07/2023 1936   RDW 18.5 (H) 09/07/2023 1936   RDW 15.5 (H) 04/17/2022 1452   LYMPHSABS PENDING 09/07/2023 1936   LYMPHSABS 1.4 02/18/2022 1117   MONOABS PENDING 09/07/2023 1936   EOSABS PENDING 09/07/2023 1936   EOSABS 0.3 02/18/2022 1117   BASOSABS PENDING 09/07/2023 1936   BASOSABS 0.1 02/18/2022 1117    BMET    Component Value Date/Time   NA 139 09/07/2023 1936   NA 139 04/17/2022 1452   K 2.5 (LL) 09/07/2023 1936   CL 96 (L) 09/07/2023 1936   CO2 30 09/07/2023 1936   GLUCOSE 125 (H) 09/07/2023 1936   BUN 16 09/07/2023 1936   BUN 42 (H) 04/17/2022 1452   CREATININE 3.52 (H)  09/07/2023 1936   CALCIUM 7.6 (L) 09/07/2023 1936   GFRNONAA 18 (L)  09/07/2023 1936   GFRAA 42 (L) 10/18/2019 1006    COAGS: Lab Results  Component Value Date   INR 3.3 (H) 09/07/2023   INR 1.1 03/19/2023   INR 1.8 (H) 01/26/2023      ASSESSMENT/PLAN: This is a 68 y.o. male with severe necrotic changes to the right lower extremity, as well as tibia open to air at the left below-knee amputation site.   I was called due to concern for necrotizing fasciitis.  I do not appreciate this on physical exam.  While there is air on x-ray, there is no crepitus in the calf, no crepitus at the ankle.  The foot has open wounds throughout.  I am not surprised that there is air in the plantar aspect.  [name] [name] is normotensive, within normal heart rate, nonlabored breathing.  No signs of sepsis.  A long conversation with him and his wife regarding bilateral lower extremities.  The most pressing being the right.  Unfortunately, there is granulating tissue on the dorsal aspect of the foot, which appears healthy, however there is severe tissue destruction on the plantar aspect with air in the base of the foot. [name] [name] would be best served with a higher level amputation.  We discussed below-knee versus above-knee amputation.  With contralateral amputation, and comorbidities which include end-stage renal disease and diabetes, I think he would be best served with above-knee amputation.  I do not think at the age of 35, he has someone who will be able to ambulate on bilateral below-knee amputations.  Furthermore, once the right side is completed, we will need to revise left-sided below-knee amputation either a higher level below-knee amputation versus an above-knee amputation.  [name] [name] was clear that he may leave AGAINST MEDICAL ADVICE.  I was very honest that I think doing so would likely create a life-threatening infection over the next several days.  Discussed below-knee versus above-knee amputation.  He stated he was not ready to make that decision.  At this point, he does not need  emergent OR as vital signs are well, with no signs of ascending infection.  Plan will be for amputation tomorrow once [name] [name] has had time to process, and decide between a below-knee amputation, which he realized will have a poor likelihood of healing, versus above-knee amputation.  Start broad-spectrum antibiotics. Patient is on Eliquis.  Would appreciate Eliquis reversal tonight prior to surgery tomorrow. His amputation is not one that should wait for 2 days for Eliquis washout. This is a process that has been going on now for several weeks per both the patient and his ex-wife.  While I do not think this will be the case, should he decompensate, please call me as I am happy to take him to the OR.  At the time of our discussion this evening, he was not amenable to OR.  I have posted him to the operating tomorrow AM. I discussed the above with his ex-wife, who was present at bedside. Please make n.p.o. midnight.    Victorino Sparrow MD MS [facility] [phone] 09/07/2023  8:47 PM

## 2023-09-07 NOTE — Sepsis Progress Note (Signed)
 Elink will follow per sepsis protocol.

## 2023-09-07 NOTE — ED Provider Triage Note (Signed)
 Emergency Medicine Provider Triage Evaluation Note  [name] [name] , a 68 y.o. male  was evaluated in triage.  Pt complains of foot wound of right foot, nonhealing for weeks. Worsening swelling, redness, systemic fever. Hx of diabetes, CHF, ESRD on dialysis.  Review of Systems  Positive: Foot wounds, fever Negative: Chest pain, shob, nausea, vomiting  Physical Exam  BP (!) 152/49 (BP Location: Left Arm)   Pulse 85   Temp (!) 100.7 F (38.2 C) (Oral)   Resp 20   Ht 5\' 10"  (1.778 m)   Wt 102.1 kg   SpO2 96%   BMI 32.28 kg/m  Gen:   Awake, no distress   Resp:  Normal effort  MSK:   Moves extremities without difficulty  Other:  See photos Doppler for pulses was successful on affected foot  Medical Decision Making  Medically screening exam initiated at 3:56 PM.  Appropriate orders placed.  [name] [name] was informed that the remainder of the evaluation will be completed by another provider, this initial triage assessment does not replace that evaluation, and the importance of remaining in the ED until their evaluation is complete.  Workup initiated in triage     Olene Floss, PA-C 09/07/23 1600

## 2023-09-07 NOTE — ED Triage Notes (Addendum)
 Pt presents with a non-healing wound to his R foot that has been presents with September and has suddenly become malodorous and looking worse. Pt has not had any abx recently.   Pt with ESRD is on HD Monday and Friday and has not missed any treatments.

## 2023-09-07 NOTE — Telephone Encounter (Signed)
 If there is concern about the wound, then patient needs an appointment.

## 2023-09-08 ENCOUNTER — Inpatient Hospital Stay (HOSPITAL_COMMUNITY): Admitting: Certified Registered Nurse Anesthetist

## 2023-09-08 ENCOUNTER — Encounter (HOSPITAL_COMMUNITY)
Admission: EM | Disposition: A | Discharge status: Skilled Nursing Facility | Payer: Self-pay | Source: Home / Self Care | Attending: Internal Medicine

## 2023-09-08 ENCOUNTER — Encounter: Payer: Medicare HMO | Admitting: Surgical

## 2023-09-08 ENCOUNTER — Encounter (HOSPITAL_COMMUNITY): Payer: Self-pay | Admitting: Internal Medicine

## 2023-09-08 ENCOUNTER — Other Ambulatory Visit: Payer: Self-pay

## 2023-09-08 ENCOUNTER — Telehealth: Payer: Self-pay | Admitting: Plastic Surgery

## 2023-09-08 DIAGNOSIS — I2510 Atherosclerotic heart disease of native coronary artery without angina pectoris: Secondary | ICD-10-CM | POA: Diagnosis not present

## 2023-09-08 DIAGNOSIS — I5032 Chronic diastolic (congestive) heart failure: Secondary | ICD-10-CM

## 2023-09-08 DIAGNOSIS — I96 Gangrene, not elsewhere classified: Secondary | ICD-10-CM | POA: Diagnosis not present

## 2023-09-08 DIAGNOSIS — I110 Hypertensive heart disease with heart failure: Secondary | ICD-10-CM | POA: Diagnosis not present

## 2023-09-08 DIAGNOSIS — E1152 Type 2 diabetes mellitus with diabetic peripheral angiopathy with gangrene: Secondary | ICD-10-CM | POA: Diagnosis not present

## 2023-09-08 HISTORY — PX: AMPUTATION: SHX166

## 2023-09-08 LAB — CBC
HCT: 23.2 % — ABNORMAL LOW (ref 39.0–52.0)
Hemoglobin: 6.9 g/dL — CL (ref 13.0–17.0)
MCH: 24.6 pg — ABNORMAL LOW (ref 26.0–34.0)
MCHC: 29.7 g/dL — ABNORMAL LOW (ref 30.0–36.0)
MCV: 82.6 fL (ref 80.0–100.0)
Platelets: 448 10*3/uL — ABNORMAL HIGH (ref 150–400)
RBC: 2.81 MIL/uL — ABNORMAL LOW (ref 4.22–5.81)
RDW: 18.3 % — ABNORMAL HIGH (ref 11.5–15.5)
WBC: 23.8 10*3/uL — ABNORMAL HIGH (ref 4.0–10.5)
nRBC: 0 % (ref 0.0–0.2)

## 2023-09-08 LAB — IRON AND TIBC: Iron: 11 ug/dL — ABNORMAL LOW (ref 45–182)

## 2023-09-08 LAB — CBG MONITORING, ED
Glucose-Capillary: 105 mg/dL — ABNORMAL HIGH (ref 70–99)
Glucose-Capillary: 97 mg/dL (ref 70–99)
Glucose-Capillary: 99 mg/dL (ref 70–99)

## 2023-09-08 LAB — COMPREHENSIVE METABOLIC PANEL
ALT: 40 U/L (ref 0–44)
AST: 66 U/L — ABNORMAL HIGH (ref 15–41)
Albumin: 1.8 g/dL — ABNORMAL LOW (ref 3.5–5.0)
Alkaline Phosphatase: 54 U/L (ref 38–126)
Anion gap: 10 (ref 5–15)
BUN: 19 mg/dL (ref 8–23)
CO2: 31 mmol/L (ref 22–32)
Calcium: 7.4 mg/dL — ABNORMAL LOW (ref 8.9–10.3)
Chloride: 97 mmol/L — ABNORMAL LOW (ref 98–111)
Creatinine, Ser: 4.16 mg/dL — ABNORMAL HIGH (ref 0.61–1.24)
GFR, Estimated: 15 mL/min — ABNORMAL LOW (ref >60)
Glucose, Bld: 102 mg/dL — ABNORMAL HIGH (ref 70–99)
Potassium: 2.9 mmol/L — ABNORMAL LOW (ref 3.5–5.1)
Sodium: 138 mmol/L (ref 135–145)
Total Bilirubin: 1 mg/dL (ref 0.0–1.2)
Total Protein: 6.2 g/dL — ABNORMAL LOW (ref 6.5–8.1)

## 2023-09-08 LAB — PROTIME-INR
INR: 3.3 — ABNORMAL HIGH (ref 0.8–1.2)
Prothrombin Time: 33.9 s — ABNORMAL HIGH (ref 11.4–15.2)

## 2023-09-08 LAB — PREPARE RBC (CROSSMATCH)

## 2023-09-08 LAB — BASIC METABOLIC PANEL
Anion gap: 12 (ref 5–15)
BUN: 25 mg/dL — ABNORMAL HIGH (ref 8–23)
CO2: 26 mmol/L (ref 22–32)
Calcium: 7.6 mg/dL — ABNORMAL LOW (ref 8.9–10.3)
Chloride: 98 mmol/L (ref 98–111)
Creatinine, Ser: 4.91 mg/dL — ABNORMAL HIGH (ref 0.61–1.24)
GFR, Estimated: 12 mL/min — ABNORMAL LOW (ref >60)
Glucose, Bld: 136 mg/dL — ABNORMAL HIGH (ref 70–99)
Potassium: 3.7 mmol/L (ref 3.5–5.1)
Sodium: 136 mmol/L (ref 135–145)

## 2023-09-08 LAB — VITAMIN B12: Vitamin B-12: 692 pg/mL (ref 180–914)

## 2023-09-08 LAB — RETICULOCYTES
Immature Retic Fract: 13.3 % (ref 2.3–15.9)
RBC.: 2.98 MIL/uL — ABNORMAL LOW (ref 4.22–5.81)
Retic Count, Absolute: 27.7 10*3/uL (ref 19.0–186.0)
Retic Ct Pct: 0.9 % (ref 0.4–3.1)

## 2023-09-08 LAB — GLUCOSE, CAPILLARY
Glucose-Capillary: 124 mg/dL — ABNORMAL HIGH (ref 70–99)
Glucose-Capillary: 129 mg/dL — ABNORMAL HIGH (ref 70–99)
Glucose-Capillary: 131 mg/dL — ABNORMAL HIGH (ref 70–99)
Glucose-Capillary: 178 mg/dL — ABNORMAL HIGH (ref 70–99)
Glucose-Capillary: 96 mg/dL (ref 70–99)

## 2023-09-08 LAB — FERRITIN: Ferritin: 669 ng/mL — ABNORMAL HIGH (ref 24–336)

## 2023-09-08 LAB — FOLATE: Folate: 8.8 ng/mL (ref >5.9)

## 2023-09-08 LAB — LACTIC ACID, PLASMA
Lactic Acid, Venous: 1.3 mmol/L (ref 0.5–1.9)
Lactic Acid, Venous: 0.9 mmol/L (ref 0.5–1.9)

## 2023-09-08 SURGERY — AMPUTATION, ABOVE KNEE
Anesthesia: General | Site: Knee | Laterality: Bilateral

## 2023-09-08 MED ORDER — ALBUMIN HUMAN 5 % IV SOLN
INTRAVENOUS | Status: AC
  Filled 2023-09-08: qty 250

## 2023-09-08 MED ORDER — SODIUM CHLORIDE 0.9% IV SOLUTION
Freq: Once | INTRAVENOUS | Status: DC
Start: 2023-09-08 — End: 2023-09-08

## 2023-09-08 MED ORDER — PROPOFOL 10 MG/ML IV BOLUS
INTRAVENOUS | Status: DC | PRN
  Administered 2023-09-08: 80 mg via INTRAVENOUS

## 2023-09-08 MED ORDER — ORAL CARE MOUTH RINSE: 15.0000 mL | Freq: Once | OROMUCOSAL | Status: AC

## 2023-09-08 MED ORDER — HYDROMORPHONE HCL 1 MG/ML IJ SOLN: 0.5000 mg | INTRAMUSCULAR | Status: DC | PRN

## 2023-09-08 MED ORDER — LIDOCAINE 2% (20 MG/ML) 5 ML SYRINGE
INTRAMUSCULAR | Status: DC | PRN
  Administered 2023-09-08: 50 mg via INTRAVENOUS

## 2023-09-08 MED ORDER — CALCIUM CHLORIDE 10 % IV SOLN
INTRAVENOUS | Status: DC | PRN
  Administered 2023-09-08: 300 mg via INTRAVENOUS
  Administered 2023-09-08: 200 mg via INTRAVENOUS

## 2023-09-08 MED ORDER — ALBUMIN HUMAN 5 % IV SOLN
12.5000 g | Freq: Once | INTRAVENOUS | Status: AC
  Administered 2023-09-08: 12.5 g via INTRAVENOUS

## 2023-09-08 MED ORDER — PROPOFOL 10 MG/ML IV BOLUS
INTRAVENOUS | Status: AC
  Filled 2023-09-08: qty 20

## 2023-09-08 MED ORDER — FENTANYL CITRATE (PF) 250 MCG/5ML IJ SOLN
INTRAMUSCULAR | Status: AC
  Filled 2023-09-08: qty 5

## 2023-09-08 MED ORDER — OXYCODONE HCL 5 MG PO TABS: 5.0000 mg | ORAL_TABLET | Freq: Once | ORAL | Status: AC | PRN

## 2023-09-08 MED ORDER — SUGAMMADEX SODIUM 200 MG/2ML IV SOLN
INTRAVENOUS | Status: DC | PRN
  Administered 2023-09-08: 206.8 mg via INTRAVENOUS

## 2023-09-08 MED ORDER — POTASSIUM CHLORIDE 10 MEQ/100ML IV SOLN
INTRAVENOUS | Status: DC | PRN
  Administered 2023-09-08: 20 meq via INTRAVENOUS

## 2023-09-08 MED ORDER — SODIUM CHLORIDE 0.9 % IV SOLN
1.0000 g | INTRAVENOUS | Status: DC
  Administered 2023-09-09 – 2023-09-10 (×2): 1 g via INTRAVENOUS
  Filled 2023-09-08 (×4): qty 10

## 2023-09-08 MED ORDER — CHLORHEXIDINE GLUCONATE 0.12 % MT SOLN: 15.0000 mL | Freq: Once | OROMUCOSAL | Status: AC

## 2023-09-08 MED ORDER — 0.9 % SODIUM CHLORIDE (POUR BTL) OPTIME
TOPICAL | Status: DC | PRN
  Administered 2023-09-08: 1000 mL

## 2023-09-08 MED ORDER — POTASSIUM CHLORIDE 10 MEQ/100ML IV SOLN
10.0000 meq | INTRAVENOUS | Status: DC
  Administered 2023-09-08: 10 meq via INTRAVENOUS
  Filled 2023-09-08: qty 100

## 2023-09-08 MED ORDER — FENTANYL CITRATE (PF) 100 MCG/2ML IJ SOLN
25.0000 ug | INTRAMUSCULAR | Status: DC | PRN
Start: 2023-09-08 — End: 2023-09-08

## 2023-09-08 MED ORDER — DEXAMETHASONE SODIUM PHOSPHATE 10 MG/ML IJ SOLN
INTRAMUSCULAR | Status: AC
  Filled 2023-09-08: qty 1

## 2023-09-08 MED ORDER — CEFAZOLIN SODIUM-DEXTROSE 2-3 GM-%(50ML) IV SOLR
INTRAVENOUS | Status: DC | PRN
  Administered 2023-09-08: 2 g via INTRAVENOUS

## 2023-09-08 MED ORDER — CEFAZOLIN SODIUM-DEXTROSE 2-4 GM/100ML-% IV SOLN
INTRAVENOUS | Status: AC
  Filled 2023-09-08: qty 100

## 2023-09-08 MED ORDER — EPHEDRINE SULFATE-NACL 50-0.9 MG/10ML-% IV SOSY
PREFILLED_SYRINGE | INTRAVENOUS | Status: DC | PRN
  Administered 2023-09-08: 10 mg via INTRAVENOUS

## 2023-09-08 MED ORDER — FENTANYL CITRATE (PF) 250 MCG/5ML IJ SOLN
INTRAMUSCULAR | Status: DC | PRN
  Administered 2023-09-08: 100 ug via INTRAVENOUS

## 2023-09-08 MED ORDER — FENTANYL CITRATE (PF) 100 MCG/2ML IJ SOLN
INTRAMUSCULAR | Status: AC
  Filled 2023-09-08: qty 2

## 2023-09-08 MED ORDER — SODIUM CHLORIDE 0.9% IV SOLUTION: Freq: Once | INTRAVENOUS | Status: AC

## 2023-09-08 MED ORDER — SODIUM CHLORIDE 0.9 % IV SOLN: INTRAVENOUS | Status: DC

## 2023-09-08 MED ORDER — OXYCODONE HCL 5 MG/5ML PO SOLN
5.0000 mg | Freq: Once | ORAL | Status: AC | PRN
  Administered 2023-09-08: 5 mg via ORAL

## 2023-09-08 MED ORDER — LIDOCAINE 2% (20 MG/ML) 5 ML SYRINGE
INTRAMUSCULAR | Status: AC
Start: 2023-09-08 — End: ?
  Filled 2023-09-08: qty 5

## 2023-09-08 MED ORDER — SODIUM CHLORIDE 0.9 % IV SOLN: INTRAVENOUS | Status: DC | PRN

## 2023-09-08 MED ORDER — CALCIUM CHLORIDE 10 % IV SOLN
INTRAVENOUS | Status: AC
  Filled 2023-09-08: qty 10

## 2023-09-08 MED ORDER — PHENYLEPHRINE HCL-NACL 20-0.9 MG/250ML-% IV SOLN
INTRAVENOUS | Status: DC | PRN
  Administered 2023-09-08: 50 ug/min via INTRAVENOUS

## 2023-09-08 MED ORDER — LINEZOLID 600 MG/300ML IV SOLN
600.0000 mg | Freq: Two times a day (BID) | INTRAVENOUS | Status: DC
  Administered 2023-09-08 – 2023-09-11 (×7): 600 mg via INTRAVENOUS
  Filled 2023-09-08 (×7): qty 300

## 2023-09-08 MED ORDER — OXYCODONE HCL 5 MG/5ML PO SOLN
ORAL | Status: AC
  Filled 2023-09-08: qty 5

## 2023-09-08 MED ORDER — POTASSIUM CHLORIDE 10 MEQ/100ML IV SOLN
10.0000 meq | INTRAVENOUS | Status: DC
  Filled 2023-09-08: qty 100

## 2023-09-08 MED ORDER — PHENYLEPHRINE HCL-NACL 20-0.9 MG/250ML-% IV SOLN
INTRAVENOUS | Status: AC
  Filled 2023-09-08: qty 250

## 2023-09-08 MED ORDER — ONDANSETRON HCL 4 MG/2ML IJ SOLN
INTRAMUSCULAR | Status: DC | PRN
  Administered 2023-09-08: 4 mg via INTRAVENOUS

## 2023-09-08 MED ORDER — INSULIN ASPART 100 UNIT/ML IJ SOLN: 0.0000 [IU] | INTRAMUSCULAR | Status: DC | PRN

## 2023-09-08 MED ORDER — ONDANSETRON HCL 4 MG/2ML IJ SOLN
INTRAMUSCULAR | Status: AC
  Filled 2023-09-08: qty 6

## 2023-09-08 MED ORDER — ONDANSETRON HCL 4 MG/2ML IJ SOLN: 4.0000 mg | Freq: Four times a day (QID) | INTRAMUSCULAR | Status: DC | PRN

## 2023-09-08 MED ORDER — CHLORHEXIDINE GLUCONATE 0.12 % MT SOLN
OROMUCOSAL | Status: AC
  Administered 2023-09-08: 15 mL via OROMUCOSAL
  Filled 2023-09-08: qty 15

## 2023-09-08 MED ORDER — ROCURONIUM BROMIDE 10 MG/ML (PF) SYRINGE
PREFILLED_SYRINGE | INTRAVENOUS | Status: DC | PRN
  Administered 2023-09-08: 50 mg via INTRAVENOUS

## 2023-09-08 MED ORDER — ALBUMIN HUMAN 5 % IV SOLN: INTRAVENOUS | Status: DC | PRN

## 2023-09-08 MED ORDER — PHENYLEPHRINE 80 MCG/ML (10ML) SYRINGE FOR IV PUSH (FOR BLOOD PRESSURE SUPPORT)
80.0000 ug | PREFILLED_SYRINGE | Freq: Once | INTRAVENOUS | Status: AC | PRN
  Administered 2023-09-08: 35 ug via INTRAVENOUS
  Administered 2023-09-08: 40 ug via INTRAVENOUS
  Administered 2023-09-08: 50 ug via INTRAVENOUS
  Administered 2023-09-08: 45 ug via INTRAVENOUS

## 2023-09-08 SURGICAL SUPPLY — 47 items
BAG COUNTER SPONGE SURGICOUNT (BAG) ×1 IMPLANT
BANDAGE ESMARK 6X9 LF (GAUZE/BANDAGES/DRESSINGS) ×1 IMPLANT
BLADE SAGITTAL 25.0X1.19X90 (BLADE) IMPLANT
BLADE SAW GIGLI 510 (BLADE) ×1 IMPLANT
BLADE SAW THK.89X75X18XSGTL (BLADE) IMPLANT
BNDG COHESIVE 6X5 TAN ST LF (GAUZE/BANDAGES/DRESSINGS) ×1 IMPLANT
BNDG ELASTIC 4INX 5YD STR LF (GAUZE/BANDAGES/DRESSINGS) IMPLANT
BNDG ELASTIC 6INX 5YD STR LF (GAUZE/BANDAGES/DRESSINGS) ×1 IMPLANT
BNDG ESMARK 6X9 LF (GAUZE/BANDAGES/DRESSINGS) ×1 IMPLANT
BNDG GAUZE DERMACEA FLUFF 4 (GAUZE/BANDAGES/DRESSINGS) ×1 IMPLANT
CLIP LIGATING EXTRA MED SLVR (CLIP) ×1 IMPLANT
CLIP LIGATING EXTRA SM BLUE (MISCELLANEOUS) ×1 IMPLANT
COVER SURGICAL LIGHT HANDLE (MISCELLANEOUS) ×1 IMPLANT
CUFF TRNQT CYL 24X4X16.5-23 (TOURNIQUET CUFF) IMPLANT
CUFF TRNQT CYL 34X4.125X (TOURNIQUET CUFF) IMPLANT
DRAIN CHANNEL 19F RND (DRAIN) IMPLANT
DRAPE DERMATAC (DRAPES) IMPLANT
DRAPE HALF SHEET 40X57 (DRAPES) ×1 IMPLANT
DRAPE INCISE IOBAN 66X45 STRL (DRAPES) IMPLANT
DRAPE SURG ORHT 6 SPLT 77X108 (DRAPES) ×2 IMPLANT
DRSG ADAPTIC 3X8 NADH LF (GAUZE/BANDAGES/DRESSINGS) ×1 IMPLANT
ELECT CAUTERY BLADE 6.4 (BLADE) ×1 IMPLANT
ELECT REM PT RETURN 9FT ADLT (ELECTROSURGICAL) ×1 IMPLANT
ELECTRODE REM PT RTRN 9FT ADLT (ELECTROSURGICAL) ×1 IMPLANT
EVACUATOR SILICONE 100CC (DRAIN) IMPLANT
GAUZE SPONGE 4X4 12PLY STRL (GAUZE/BANDAGES/DRESSINGS) ×1 IMPLANT
GLOVE BIO SURGEON STRL SZ7.5 (GLOVE) ×1 IMPLANT
GOWN STRL REUS W/ TWL LRG LVL3 (GOWN DISPOSABLE) ×2 IMPLANT
GOWN STRL REUS W/ TWL XL LVL3 (GOWN DISPOSABLE) ×1 IMPLANT
KIT BASIN OR (CUSTOM PROCEDURE TRAY) ×1 IMPLANT
KIT TURNOVER KIT B (KITS) ×1 IMPLANT
NS IRRIG 1000ML POUR BTL (IV SOLUTION) ×1 IMPLANT
PACK GENERAL/GYN (CUSTOM PROCEDURE TRAY) ×1 IMPLANT
PAD ARMBOARD POSITIONER FOAM (MISCELLANEOUS) ×2 IMPLANT
POWDER SURGICEL 3.0 GRAM (HEMOSTASIS) IMPLANT
RASP HELIOCORDIAL MED (MISCELLANEOUS) IMPLANT
SPONGE T-LAP 18X18 ~~LOC~~+RFID (SPONGE) IMPLANT
STAPLER SKIN PROX WIDE 3.9 (STAPLE) IMPLANT
STOCKINETTE IMPERVIOUS LG (DRAPES) ×1 IMPLANT
SUT ETHILON 3 0 PS 1 (SUTURE) IMPLANT
SUT SILK 0 TIES 10X30 (SUTURE) ×1 IMPLANT
SUT SILK 2-0 18XBRD TIE 12 (SUTURE) ×1 IMPLANT
SUT SILK 3-0 18XBRD TIE 12 (SUTURE) IMPLANT
SUT VIC AB 2-0 CT1 18 (SUTURE) ×2 IMPLANT
TOWEL GREEN STERILE (TOWEL DISPOSABLE) ×2 IMPLANT
UNDERPAD 30X36 HEAVY ABSORB (UNDERPADS AND DIAPERS) ×1 IMPLANT
WATER STERILE IRR 1000ML POUR (IV SOLUTION) ×1 IMPLANT

## 2023-09-08 NOTE — Progress Notes (Signed)
  Progress Note    09/08/2023 10:26 AM * Day of Surgery *  Subjective:  no complaints today  Vitals:   09/08/23 0940 09/08/23 1008  BP: 123/71 (!) 134/113  Pulse: (!) 57 60  Resp: 15 16  Temp:  97.7 F (36.5 C)  SpO2: 100% 98%    Physical Exam: Awake alert and oriented Right foot very foul-smelling with dressing in place Left below-knee amputation site with exposed tibia  CBC    Component Value Date/Time   WBC 23.8 (H) 09/08/2023 0500   RBC 2.98 (L) 09/08/2023 0617   RBC 2.81 (L) 09/08/2023 0500   HGB 6.9 (LL) 09/08/2023 0500   HGB 7.6 (L) 04/17/2022 1452   HCT 23.2 (L) 09/08/2023 0500   HCT 25.3 (L) 04/17/2022 1452   PLT 448 (H) 09/08/2023 0500   PLT 351 04/17/2022 1452   MCV 82.6 09/08/2023 0500   MCV 88 04/17/2022 1452   MCH 24.6 (L) 09/08/2023 0500   MCHC 29.7 (L) 09/08/2023 0500   RDW 18.3 (H) 09/08/2023 0500   RDW 15.5 (H) 04/17/2022 1452   LYMPHSABS 0.0 (L) 09/07/2023 1936   LYMPHSABS 1.4 02/18/2022 1117   MONOABS 1.7 (H) 09/07/2023 1936   EOSABS 0.0 09/07/2023 1936   EOSABS 0.3 02/18/2022 1117   BASOSABS 0.3 (H) 09/07/2023 1936   BASOSABS 0.1 02/18/2022 1117    BMET    Component Value Date/Time   NA 138 09/08/2023 0500   NA 139 04/17/2022 1452   K 2.9 (L) 09/08/2023 0500   CL 97 (L) 09/08/2023 0500   CO2 31 09/08/2023 0500   GLUCOSE 102 (H) 09/08/2023 0500   BUN 19 09/08/2023 0500   BUN 42 (H) 04/17/2022 1452   CREATININE 4.16 (H) 09/08/2023 0500   CALCIUM 7.4 (L) 09/08/2023 0500   GFRNONAA 15 (L) 09/08/2023 0500   GFRAA 42 (L) 10/18/2019 1006    INR    Component Value Date/Time   INR 3.3 (H) 09/08/2023 0500    No intake or output data in the 24 hours ending 09/08/23 1026   Assessment/plan:  68 y.o. male is here with gas gangrene infection in the right foot planning for right above-knee amputation.  He also has exposed tibia and the left below-knee amputation site.  As such plan for bilateral above-knee amputations today in the OR.   I am discussed the risk and benefits he demonstrates good understanding and consent was signed.   Necha Harries C. Randie Heinz, MD [facility] [place] Office: [phone] Pager: [phone]  09/08/2023 10:26 AM

## 2023-09-08 NOTE — Progress Notes (Signed)
 PROGRESS NOTE  [name] [name] ZOX:[id number] DOB: [birth year] DOA: 09/07/2023 PCP: [name], PA-C   LOS: 1 day   Brief narrative:   [name] [name] is a 68 y.o. male with medical history significant of end-stage renal disease on hemodialysis Monday Wednesday Friday, anemia, hypertension, history of coronary artery disease status post CABG, type 2 diabetes, congestive heart failure with ejection fraction of 30%, obstructive sleep apnea on CPAP, paroxysmal atrial fibrillation, history of severe peripheral vascular disease with critical limb ischemia status post atherectomy and stenting of left SFA and popliteal artery on 12/29/2022 by Dr. Randie Heinz subsequently followed by left BKA on 01/26/2023.  Also had right heel ulcer leading to right common femoral to below-knee popliteal artery bypass on  03/19/2023. Since then patient has had difficulty with healing of both his left BKA wound as well as non-healing ulcer on right heel with foul-smelling discharge from the right foot.  Patient then presented to hospital.  In the ED patient was seen by vascular surgery and was noted to have severe necrotic changes to the right lower extremities and nonhealing below-knee amputation on the left.  Patient was also febrile and slightly hypotensive and lactic acid was elevated at 2.5.  Chest x-ray showed masslike opacity in the suprahilar region of the left.  Right foot x-ray showed diffuse soft tissue edema and extensive subcu gas with destructive changes of the first metatarsal joint likely osteomyelitis and septic arthritis.  Patient was given cefepime and Zyvox and was considered for admission to the hospital for further evaluation and treatment.     Assessment/Plan: Principal Problem:   Gangrene of lower extremity (HCC) Active Problems:   Mixed hyperlipidemia   Paroxysmal atrial fibrillation (HCC)   Chronic diastolic CHF (congestive heart failure) (HCC)   S/P CABG x 3   End stage renal disease  (HCC)     Nonhealing right lower extremity ulcer with progressive gangrene  Patient with history of severe PVD with multiple intervention in the past.  Currently on Zyvox and cefepime.  Eliquis on hold for surgical intervention with vascular surgery.  MRI of the right ankle and foot with osteomyelitis and soft tissue ulceration.  Vascular surgery has seen the patient and patient has undergone right above-knee amputation on 09/08/2023.  Follow vascular surgery recommendation.  Nonhealing left below-knee amputation site.  Vascular surgery on board.  Patient underwent left above-knee amputation on 09/08/2023.  Follow vascular surgery recommendation..  ESRD M[facility] Nephrology has been consulted for hemodialysis.  Continue midodrine.   Anemia Of chronic kidney disease.  Currently at baseline.  Continue to monitor.   History of hypotension  Currently on midodrine.  Blood pressure is stable at this time.    CAD s/p CABG No active issues at this time.  Continue Lipitor   Diabetes mellitus type 2. Continue sliding scale insulin.  Will resume diabetic diet when okay with surgery.  Chronic systolic heart failure. Continue hemodialysis for fluid management.   OSA Continue CPAP at bedtime.   Paroxysmal atrial fibrillation. -continue on amiodarone, eliquis on hold for planned procedure, will resume Eliquis when okay with vascular surgery.  Rate controlled at this time.   COPD -continue on breo and bronchodilators.  Appears compensated.  Class I obesity.  Body mass index is 32.71 kg/m. Would benefit from ongoing weight loss as outpatient.  DVT prophylaxis: Eliquis on hold.   Disposition: Uncertain at this time.  Status is: Inpatient Remains inpatient appropriate because: Status post bilateral above-knee amputation,    Code Status:  Code Status: Full Code  Family Communication: None at bedside  Consultants: Vascular surgery Nephrology  Procedures: Bilateral above-knee amputation  on 09/08/2023  Anti-infectives:  Zyvox and cefepime  Anti-infectives (From admission, onward)    Start     Dose/Rate Route Frequency Ordered Stop   09/08/23 1800  ceFEPIme (MAXIPIME) 2 g in sodium chloride 0.9 % 100 mL IVPB  Status:  Discontinued        2 g 200 mL/hr over 30 Minutes Intravenous Every 24 hours 09/07/23 2236 09/08/23 0906   09/08/23 1800  [MAR Hold]  ceFEPIme (MAXIPIME) 1 g in sodium chloride 0.9 % 100 mL IVPB        (MAR Hold since Tue 09/08/2023 at 1001.Hold Reason: Transfer to a Procedural area)   1 g 200 mL/hr over 30 Minutes Intravenous Every 24 hours 09/08/23 0906     09/08/23 1000  [MAR Hold]  linezolid (ZYVOX) IVPB 600 mg        (MAR Hold since Tue 09/08/2023 at 1001.Hold Reason: Transfer to a Procedural area)   600 mg 300 mL/hr over 60 Minutes Intravenous Every 12 hours 09/08/23 0658     09/08/23 0946  ceFAZolin (ANCEF) 2-4 GM/100ML-% IVPB       Note to Pharmacy: Shanda Bumps M: cabinet override      09/08/23 0946 09/08/23 2159   09/07/23 1930  linezolid (ZYVOX) IVPB 600 mg        600 mg 300 mL/hr over 60 Minutes Intravenous  Once 09/07/23 1923 09/07/23 2255   09/07/23 1815  clindamycin (CLEOCIN) IVPB 600 mg  Status:  Discontinued        600 mg 100 mL/hr over 30 Minutes Intravenous  Once 09/07/23 1808 09/07/23 1923   09/07/23 1745  ceFEPIme (MAXIPIME) 2 g in sodium chloride 0.9 % 100 mL IVPB        2 g 200 mL/hr over 30 Minutes Intravenous  Once 09/07/23 1734 09/07/23 2031   09/07/23 1745  vancomycin (VANCOCIN) IVPB 1000 mg/200 mL premix  Status:  Discontinued        1,000 mg 200 mL/hr over 60 Minutes Intravenous  Once 09/07/23 1734 09/07/23 1741   09/07/23 1745  vancomycin (VANCOREADY) IVPB 2000 mg/400 mL  Status:  Discontinued        2,000 mg 200 mL/hr over 120 Minutes Intravenous  Once 09/07/23 1741 09/07/23 1933       Subjective: Today, patient was seen and examined at bedside.  Seen at PACU after surgery.  Complains of mild nausea, no overt pain  shortness of breath chest pain or fever.  Objective: Vitals:   09/08/23 1330 09/08/23 1345  BP: (!) 100/48 (!) 97/57  Pulse: 73 69  Resp: 19 (!) 27  Temp:    SpO2: 99% 100%    Intake/Output Summary (Last 24 hours) at 09/08/2023 1402 Last data filed at 09/08/2023 1300 Gross per 24 hour  Intake 1114 ml  Output 500 ml  Net 614 ml   Filed Weights   09/07/23 1542 09/08/23 1008  Weight: 102.1 kg 103.4 kg   Body mass index is 32.71 kg/m.   Physical Exam:  GENERAL: Patient is alert awake and oriented. Not in obvious distress.  Obese built HENT: No scleral pallor or icterus. Pupils equally reactive to light. Oral mucosa is moist NECK: is supple, no gross swelling noted. CHEST: Clear to auscultation. No crackles or wheezes.  Diminished breath sounds bilaterally. CVS: S1 and S2 heard, no murmur. Regular rate and rhythm.  ABDOMEN:  Soft, non-tender, bowel sounds are present. EXTREMITIES: Bilateral above-knee amputation with stump covered with Kerlix bandage. CNS: Cranial nerves are intact. No focal motor deficits. SKIN: warm and dry  Data Review: I have personally reviewed the following laboratory data and studies,  CBC: Recent Labs  Lab 09/07/23 1936 09/08/23 0500  WBC 27.9* 23.8*  NEUTROABS 25.9*  --   HGB 8.0* 6.9*  HCT 26.4* 23.2*  MCV 81.0 82.6  PLT 550* 448*   Basic Metabolic Panel: Recent Labs  Lab 09/07/23 1936 09/08/23 0500  NA 139 138  K 2.5* 2.9*  CL 96* 97*  CO2 30 31  GLUCOSE 125* 102*  BUN 16 19  CREATININE 3.52* 4.16*  CALCIUM 7.6* 7.4*   Liver Function Tests: Recent Labs  Lab 09/07/23 1936 09/08/23 0500  AST 79* 66*  ALT 49* 40  ALKPHOS 71 54  BILITOT 0.9 1.0  PROT 7.2 6.2*  ALBUMIN 1.7* 1.8*   No results for input(s): "LIPASE", "AMYLASE" in the last 168 hours. No results for input(s): "AMMONIA" in the last 168 hours. Cardiac Enzymes: No results for input(s): "CKTOTAL", "CKMB", "CKMBINDEX", "TROPONINI" in the last 168 hours. BNP (last  3 results) Recent Labs    02/05/23 1408 09/07/23 1936  BNP 553.7* 484.4*    ProBNP (last 3 results) No results for input(s): "PROBNP" in the last 8760 hours.  CBG: Recent Labs  Lab 09/08/23 0046 09/08/23 0456 09/08/23 0800 09/08/23 1024 09/08/23 1305  GLUCAP 99 97 105* 96 131*   Recent Results (from the past 240 hours)  Culture, blood (Routine x 2)     Status: None (Preliminary result)   Collection Time: 09/07/23  3:52 PM   Specimen: BLOOD  Result Value Ref Range Status   Specimen Description BLOOD SITE NOT SPECIFIED  Final   Special Requests   Final    BOTTLES DRAWN AEROBIC AND ANAEROBIC Blood Culture results may not be optimal due to an inadequate volume of blood received in culture bottles   Culture   Final    NO GROWTH < 12 HOURS Performed at [facility], [address], [place], [place] [address]    Report Status PENDING  Incomplete  Culture, blood (Routine x 2)     Status: None (Preliminary result)   Collection Time: 09/07/23  7:36 PM   Specimen: BLOOD  Result Value Ref Range Status   Specimen Description BLOOD SITE NOT SPECIFIED  Final   Special Requests   Final    BOTTLES DRAWN AEROBIC AND ANAEROBIC Blood Culture results may not be optimal due to an inadequate volume of blood received in culture bottles   Culture   Final    NO GROWTH < 12 HOURS Performed at [facility], [address], [place], [place] [address]    Report Status PENDING  Incomplete  Resp panel by RT-PCR (RSV, Flu A&B, Covid) Peripheral     Status: None   Collection Time: 09/07/23  7:36 PM   Specimen: Peripheral; Nasal Swab  Result Value Ref Range Status   SARS Coronavirus 2 by RT PCR NEGATIVE NEGATIVE Final   Influenza A by PCR NEGATIVE NEGATIVE Final   Influenza B by PCR NEGATIVE NEGATIVE Final    Comment: (NOTE) The Xpert Xpress SARS-CoV-2/FLU/RSV plus assay is intended as an aid in the diagnosis of influenza from Nasopharyngeal swab specimens and should not be used as a  sole basis for treatment. Nasal washings and aspirates are unacceptable for Xpert Xpress SARS-CoV-2/FLU/RSV testing.  Fact  Sheet for Patients: BloggerCourse.com  Fact Sheet for Healthcare Providers: SeriousBroker.it  This test is not yet approved or cleared by the [place] FDA and has been authorized for detection and/or diagnosis of SARS-CoV-2 by FDA under an Emergency Use Authorization (EUA). This EUA will remain in effect (meaning this test can be used) for the duration of the COVID-19 declaration under Section 564(b)(1) of the Act, 21 U.S.C. section 360bbb-3(b)(1), unless the authorization is terminated or revoked.     Resp Syncytial Virus by PCR NEGATIVE NEGATIVE Final    Comment: (NOTE) Fact Sheet for Patients: BloggerCourse.com  Fact Sheet for Healthcare Providers: SeriousBroker.it  This test is not yet approved or cleared by the [place] FDA and has been authorized for detection and/or diagnosis of SARS-CoV-2 by FDA under an Emergency Use Authorization (EUA). This EUA will remain in effect (meaning this test can be used) for the duration of the COVID-19 declaration under Section 564(b)(1) of the Act, 21 U.S.C. section 360bbb-3(b)(1), unless the authorization is terminated or revoked.  Performed at [facility], [address], [place], [place] [address]      Studies: MR ANKLE RIGHT W WO CONTRAST Result Date: 09/08/2023 CLINICAL DATA:  Foot wound with radiographic findings consistent with osteomyelitis and gas-forming infection in the forefoot. Concern for necrotizing fasciitis. EXAM: MRI OF THE RIGHT ANKLE WITHOUT AND WITH CONTRAST TECHNIQUE: Multiplanar, multisequence MR imaging of the ankle was performed before and after the administration of intravenous contrast. CONTRAST:  10mL GADAVIST GADOBUTROL 1 MMOL/ML IV SOLN COMPARISON:  Foot radiographs  09/07/2023 and 05/07/2020. MRI 05/21/2020. FINDINGS: Forefoot findings dictated separately TENDONS Peroneal: Intact and normally positioned. Posteromedial: Intact and normally positioned. Anterior: There is tendinosis and high-grade partial tearing of the tibialis anterior tendon distally near its insertion. No significant tenosynovitis. The additional anterior extensor tendons appear normal. Achilles: Intact. Plantar Fascia: Intact. LIGAMENTS Lateral: The anterior and posterior talofibular and calcaneofibular ligaments are intact.The inferior tibiofibular ligaments appear intact. Medial: The deltoid and visualized portions of the spring ligament appear intact. CARTILAGE AND BONES Ankle Joint: No significant ankle joint effusion or suspicious synovial enhancement. The talar dome and tibial plafond appear normal. Subtalar Joints/Sinus Tarsi: Unremarkable. Bones: No evidence of acute fracture, dislocation or osteonecrosis within the ankle or hindfoot. Midfoot and forefoot findings are dictated separately. There are new findings suspicious for osteomyelitis at the 1st tarsometatarsal joint. Other: Nonspecific diffuse soft tissue edema and enhancement throughout the ankle and hindfoot. No focal fluid collections or definite soft tissue emphysema identified within the ankle. IMPRESSION: IMPRESSION 1. Nonspecific diffuse soft tissue edema and enhancement throughout the ankle and hindfoot, suspicious for cellulitis. No focal fluid collections or definite soft tissue emphysema identified within the ankle. 2. New findings suspicious for osteomyelitis at the 1st tarsometatarsal joint. See additional details on separate report for the forefoot. 3. Tendinosis and high-grade partial tearing of the tibialis anterior tendon distally near its insertion. The additional ankle tendons and ligaments appear intact. 4. Forefoot findings dictated separately. Electronically Signed   By: Carey Bullocks M.D.   On: 09/08/2023 08:49   MR  FOOT RIGHT W WO CONTRAST Result Date: 09/08/2023 CLINICAL DATA:  Foot wound with radiographic findings consistent with osteomyelitis and gas-forming infection in the forefoot. Concern for necrotizing fasciitis. EXAM: MRI OF THE RIGHT FOREFOOT WITHOUT AND WITH CONTRAST TECHNIQUE: Multiplanar, multisequence MR imaging of the right forefoot was performed before and after the administration of intravenous contrast. CONTRAST:  10mL GADAVIST GADOBUTROL 1 MMOL/ML IV SOLN COMPARISON:  Radiographs same date. Concurrent MRI of the right ankle and hindfoot. FINDINGS: Ankle and hindfoot findings dictated separately. Bones/Joint/Cartilage As demonstrated radiographically, there are erosive changes with cortical destruction, T2 hyperintensity and heterogeneous enhancement within the 1st metatarsal head, adjacent base of the 1st proximal phalanx and head of the 5th metatarsal, suspicious for osteomyelitis. Similar additional changes are present at the 1st tarsometatarsal joint, also suspicious for osteomyelitis. There is less specific mild edema and enhancement within the middle cuneiform and base of the 2nd metatarsal which could be arthropathic. There is a small effusion of the 1st metatarsophalangeal joint which appears to communicate with a large open wound dorsally. The alignment is normal at the Lisfranc joint. Ligaments Intact Lisfranc ligament. The collateral ligaments of the 1st metatarsophalangeal joint are not well visualized. Muscles and Tendons As seen on the ankle examination, there is a high-grade partial tear of the distal anterior tibialis tendon. In addition, the extensor hallucis longus tendon appears completely ruptured at the level of the large wound dorsal to the 1st metatarsal. Generalized muscular atrophy with soft tissue edema and heterogeneous enhancement. Soft tissues As above, large area of soft tissue ulceration along the dorsal aspect of the 1st metatarsophalangeal joint with an underlying fluid  collection which demonstrates peripheral enhancement and appears to communicate with the joint. This fluid collection measures approximately 1.6 x 1.2 x 2.4 cm. Additional areas of soft tissue ulceration are noted medial to the 1st MTP joint. As above, there is heterogeneous enhancement throughout the forefoot soft tissues with scattered soft tissue emphysema and large areas of poor enhancement in the lateral and plantar aspects of the foot, highly suspicious for devitalized tissue and necrotizing soft tissue infection. IMPRESSION: 1. Findings are highly suspicious for osteomyelitis involving the 1st metatarsal head, adjacent base of the 1st proximal phalanx and head of the 5th metatarsal. Similar additional changes are present at the 1st tarsometatarsal joint, also suspicious for osteomyelitis. 2. Large area of soft tissue ulceration along the dorsal aspect of the 1st metatarsophalangeal joint with an underlying fluid collection which appears to communicate with the joint, suspicious for an abscess. Additional areas of soft tissue ulceration are noted medial to the 1st MTP joint. 3. Heterogeneous enhancement throughout the forefoot soft tissues with scattered soft tissue emphysema and large areas of poor enhancement in the lateral and plantar aspects of the foot, highly suspicious for devitalized tissue and necrotizing soft tissue infection. 4. High-grade partial tear of the distal anterior tibialis tendon. 5. Complete rupture of the extensor hallucis longus tendon at the level of the large wound dorsal to the 1st metatarsal. 6. Ankle and hindfoot findings dictated separately. Electronically Signed   By: Carey Bullocks M.D.   On: 09/08/2023 08:49   DG Foot Complete Right Result Date: 09/07/2023 CLINICAL DATA:  Wound EXAM: RIGHT FOOT COMPLETE - 3+ VIEW COMPARISON:  04/27/2020 FINDINGS: Frontal, oblique, lateral views of the right foot are obtained. There is diffuse soft tissue edema, with extensive subcutaneous  gas throughout the forefoot compatible with gas-forming infection. There is bone destruction along the first metatarsal head, base of the first proximal phalanx, and fifth metatarsal head consistent with osteomyelitis. There are no acute fractures. Diffuse osteoarthritis. Extensive atherosclerosis. IMPRESSION: 1. Diffuse soft tissue edema compatible with cellulitis. Extensive subcutaneous gas throughout the forefoot consistent with gas forming infection. 2. Destructive changes surrounding the first metatarsophalangeal joint consistent with osteomyelitis and likely septic arthritis. Additional bony destruction of the fifth metatarsal head consistent with osteomyelitis. Electronically Signed   By:  Sharlet Salina M.D.   On: 09/07/2023 17:13   DG Chest 2 View Result Date: 09/07/2023 CLINICAL DATA:  Systemic inflammatory response syndrome. EXAM: CHEST - 2 VIEW COMPARISON:  One-view x-ray 02/05/2023 FINDINGS: Status post median sternotomy. Stable cardiopericardial silhouette enlargement. Slight prominence of central vasculature. No pneumothorax or effusion. X-rays are limited. The upper aspect of the chest is clipped off the edge of the film on the frontal view and the inferior costophrenic angles are clipped on the lateral. Is also masslike opacity left perihilar. Underlying lesion is possible. Recommend follow-up chest CT with contrast when appropriate. IMPRESSION: Limited x-rays.  Postop chest. New masslike opacity along the suprahilar region on the left. Recommend a follow up chest CT with contrast to further delineate and exclude underlying neoplasm or other aggressive lesion when appropriate. Electronically Signed   By: Karen Kays M.D.   On: 09/07/2023 17:13      Joycelyn Das, MD  [facility] 09/08/2023  If 7PM-7AM, please contact night-coverage

## 2023-09-08 NOTE — Progress Notes (Signed)
 PHARMACY NOTE:  ANTIMICROBIAL RENAL DOSAGE ADJUSTMENT  Current antimicrobial regimen includes a mismatch between antimicrobial dosage and estimated renal function.  As per policy approved by the Pharmacy & Therapeutics and Medical Executive Committees, the antimicrobial dosage will be adjusted accordingly.  Current antimicrobial dosage:  Cefepime 2gm IV Q24H  Indication: osteo, abscess, necrotizing soft tissue infxn  Renal Function:  Estimated Creatinine Clearance: 20.1 mL/min (A) (by C-G formula based on SCr of 4.16 mg/dL (H)). [x]      On intermittent HD, scheduled: schedule not yet determined, MWF PTA []      On CRRT    Antimicrobial dosage has been changed to:  Cefepime 1gm IV Q24H  Candy Leverett D. Laney Potash, PharmD, BCPS, BCCCP 09/08/2023, 9:06 AM

## 2023-09-08 NOTE — Progress Notes (Signed)
 Patient brought to 4E from PACU. VSS. Telemetry box applied, CCMD notified. Patient oriented to room and staff. Call bell in reach.  Patient received to unit with PRBC unit infusing. Oncoming RN notified.   Kenard Gower, RN

## 2023-09-08 NOTE — Transfer of Care (Signed)
 Immediate Anesthesia Transfer of Care Note  Patient: [name] [name]  Procedure(s) Performed: BILATERAL AMPUTATION, ABOVE KNEE (Bilateral: Knee)  Patient Location: PACU  Anesthesia Type:General  Level of Consciousness: awake, alert , and oriented  Airway & Oxygen Therapy: Patient Spontanous Breathing  Post-op Assessment: Report given to RN and Post -op Vital signs reviewed and stable  Post vital signs: Reviewed and stable  Last Vitals:  Vitals Value Taken Time  BP 85/38 09/08/23 1320  Temp 36.6 C 09/08/23 1305  Pulse 68 09/08/23 1324  Resp 20 09/08/23 1324  SpO2 100 % 09/08/23 1324  Vitals shown include unfiled device data.  Last Pain:  Vitals:   09/08/23 1030  TempSrc: Oral  PainSc:          Complications: No notable events documented.

## 2023-09-08 NOTE — Op Note (Signed)
    Patient name: [name] [name] MRN: [id number] DOB: July 05, 1955 Sex: male  09/08/2023 Pre-operative Diagnosis: Gas gangrene right foot, exposed bone left below-knee amputation Post-operative diagnosis:  Same Surgeon:  Luanna Salk. Randie Heinz, MD Assistant: Aggie Moats, PA Procedure Performed:   Bilateral above-knee amputation  Indications: 68 year old male with multiple medical comorbidities has a history of a left BKA which was complicated by need for postoperative washout now with exposed tibia approximately 2 cm in length at the BKA site.  He also has gas gangrene of the right foot is indicated for amputation of the right lower extremity.  We discussed proceeding with bilateral lower extremity amputation above the knee and he demonstrates good understanding and agrees to proceed.  Experience assistant was necessary to facilitate amputation with exposure and transection of the femur as well as ligating appropriate vessels and nerves and reapproximation of anterior and posterior flaps.  Findings: The right lower extremity bypass was patent and was ligated.  The left SFA was patent with a stent which was transected and ligated.  The subcutaneous tissue and muscle all appeared viable bilaterally.   Procedure:  The patient was identified in the holding area and taken to the operating room where his play supine operative when general anesthesia was induced.  He was sterilely prepped draped in the bilateral lower extremities in the usual fashion, antibiotics were administered a timeout was called.  Upon completing the timeout the patient did have a run of V. tach which spontaneously resolved.  He was then planned for transfusion 2 units packed red blood cells given symptomatic anemia.  Initially we exsanguinated the right lower extremity with Esmarch and inflated the tourniquet above the knee to 250 mmHg.  We began with a fishmouth type incision on the right dissected down to the skin subcutaneous tissue  with cautery and expose the right femur.  This was transected with a power saw.  The vessels were clamped proximally and distally and transected with scissors and a posterior flap was created.  The vessels were suture-ligated.  The nerve was pulled on tension ligated and divided.  The tourniquet was allowed down.  We obtain hemostasis the wounds.  The bone was rasped thoroughly irrigated the wound and then closed the fascia with interrupted 2-0 Vicryl sutures and the skin with staples.  Attention was turned to the left lower extremity where similarly a fishmouth incision was created.  A tourniquet was not used given the short length for placement.  After fishmouth incision we dissected down through the skin and subcutaneous tissue with cautery exposed the femur.  The periosteum was elevated using a lap pad and the bone was transected with a power saw.  The posterior flap was created and the vessels were clamped.  These were transected the SFA was noted to have a stent which was somewhat removed.  Vessels were then suture-ligated.  Again the nerve was pulled on tension and ligated and divided.  We smoothed the bone with a rasp irrigated the wound and closed the fascial layer with interrupted 2-0 Vicryl and the skin was stapled.  Sterile dressings were applied.  Patient was awakened from anesthesia having tolerated procedure without any complication.  All counts were prior to completion.  EBL: 500 cc  Transfusion: 2 units packed red blood cells.   Dal Blew C. Randie Heinz, MD [facility] Office: [phone] Pager: [phone]

## 2023-09-08 NOTE — Anesthesia Preprocedure Evaluation (Signed)
 Anesthesia Evaluation  Patient identified by MRN, date of birth, ID band Patient awake    Reviewed: Allergy & Precautions, H&P , NPO status , Patient's Chart, lab work & pertinent test results  Airway Mallampati: II   Neck ROM: full    Dental   Pulmonary sleep apnea , former smoker   breath sounds clear to auscultation       Cardiovascular hypertension, + CAD, + CABG, + Peripheral Vascular Disease and +CHF  + dysrhythmias Atrial Fibrillation  Rhythm:regular Rate:Normal     Neuro/Psych  PSYCHIATRIC DISORDERS  Depression       GI/Hepatic ,GERD  ,,  Endo/Other  diabetes, Type 2  Class 3 obesity  Renal/GU      Musculoskeletal   Abdominal   Peds  Hematology  (+) Blood dyscrasia, anemia Hemoglobin 6.9 this morning.  PRBC x1 unit currently infusing.   Anesthesia Other Findings   Reproductive/Obstetrics                             Anesthesia Physical Anesthesia Plan  ASA: 4  Anesthesia Plan: General   Post-op Pain Management:    Induction: Intravenous  PONV Risk Score and Plan: 2 and Ondansetron, Dexamethasone and Treatment may vary due to age or medical condition  Airway Management Planned: Oral ETT  Additional Equipment:   Intra-op Plan:   Post-operative Plan: Extubation in OR  Informed Consent: I have reviewed the patients History and Physical, chart, labs and discussed the procedure including the risks, benefits and alternatives for the proposed anesthesia with the patient or authorized representative who has indicated his/her understanding and acceptance.     Dental advisory given  Plan Discussed with: CRNA, Anesthesiologist and Surgeon  Anesthesia Plan Comments:        Anesthesia Quick Evaluation

## 2023-09-08 NOTE — ED Notes (Signed)
 Pt dystolic Bps intermittently soft this am Pokhrel MD aware via secure text.

## 2023-09-08 NOTE — Anesthesia Procedure Notes (Signed)
 Procedure Name: Intubation Date/Time: 09/08/2023 11:20 AM  Performed by: Darryl Nestle, CRNAPre-anesthesia Checklist: Patient identified, Emergency Drugs available, Suction available and Patient being monitored Patient Re-evaluated:Patient Re-evaluated prior to induction Oxygen Delivery Method: Circle system utilized Preoxygenation: Pre-oxygenation with 100% oxygen Induction Type: IV induction Ventilation: Mask ventilation without difficulty and Oral airway inserted - appropriate to patient size Tube type: Oral Tube size: 7.0 mm Number of attempts: 1 Airway Equipment and Method: Stylet and Oral airway Placement Confirmation: ETT inserted through vocal cords under direct vision, positive ETCO2 and breath sounds checked- equal and bilateral Secured at: 23 cm Tube secured with: Tape Dental Injury: Teeth and Oropharynx as per pre-operative assessment

## 2023-09-08 NOTE — ED Notes (Signed)
 Pt otf with RN transport, no new onset distress at this time.

## 2023-09-08 NOTE — Telephone Encounter (Signed)
 Just called and pt is at Hospital now and he stated they might be cutting his leg off. He is unable to make a f/u at this time he stated

## 2023-09-08 NOTE — ED Notes (Signed)
 Report given to [place] RN of short stay. Awaiting transport of pt.

## 2023-09-09 ENCOUNTER — Encounter (HOSPITAL_COMMUNITY): Payer: Self-pay | Admitting: Vascular Surgery

## 2023-09-09 DIAGNOSIS — I96 Gangrene, not elsewhere classified: Secondary | ICD-10-CM | POA: Diagnosis not present

## 2023-09-09 LAB — HEMOGLOBIN AND HEMATOCRIT, BLOOD
HCT: 25.5 % — ABNORMAL LOW (ref 39.0–52.0)
Hemoglobin: 8.1 g/dL — ABNORMAL LOW (ref 13.0–17.0)

## 2023-09-09 LAB — BASIC METABOLIC PANEL
Anion gap: 10 (ref 5–15)
BUN: 28 mg/dL — ABNORMAL HIGH (ref 8–23)
CO2: 28 mmol/L (ref 22–32)
Calcium: 6.8 mg/dL — ABNORMAL LOW (ref 8.9–10.3)
Chloride: 95 mmol/L — ABNORMAL LOW (ref 98–111)
Creatinine, Ser: 5.49 mg/dL — ABNORMAL HIGH (ref 0.61–1.24)
GFR, Estimated: 11 mL/min — ABNORMAL LOW (ref >60)
Glucose, Bld: 135 mg/dL — ABNORMAL HIGH (ref 70–99)
Potassium: 3.3 mmol/L — ABNORMAL LOW (ref 3.5–5.1)
Sodium: 133 mmol/L — ABNORMAL LOW (ref 135–145)

## 2023-09-09 LAB — GLUCOSE, CAPILLARY
Glucose-Capillary: 127 mg/dL — ABNORMAL HIGH (ref 70–99)
Glucose-Capillary: 132 mg/dL — ABNORMAL HIGH (ref 70–99)
Glucose-Capillary: 147 mg/dL — ABNORMAL HIGH (ref 70–99)
Glucose-Capillary: 154 mg/dL — ABNORMAL HIGH (ref 70–99)
Glucose-Capillary: 174 mg/dL — ABNORMAL HIGH (ref 70–99)

## 2023-09-09 LAB — CBC
HCT: 21.2 % — ABNORMAL LOW (ref 39.0–52.0)
Hemoglobin: 6.7 g/dL — CL (ref 13.0–17.0)
MCH: 26.6 pg (ref 26.0–34.0)
MCHC: 31.6 g/dL (ref 30.0–36.0)
MCV: 84.1 fL (ref 80.0–100.0)
Platelets: 344 10*3/uL (ref 150–400)
RBC: 2.52 MIL/uL — ABNORMAL LOW (ref 4.22–5.81)
RDW: 17.5 % — ABNORMAL HIGH (ref 11.5–15.5)
WBC: 20.3 10*3/uL — ABNORMAL HIGH (ref 4.0–10.5)
nRBC: 0 % (ref 0.0–0.2)

## 2023-09-09 LAB — POCT I-STAT EG7
Acid-Base Excess: 6 mmol/L — ABNORMAL HIGH (ref 0.0–2.0)
Bicarbonate: 31.3 mmol/L — ABNORMAL HIGH (ref 20.0–28.0)
Calcium, Ion: 1.07 mmol/L — ABNORMAL LOW (ref 1.15–1.40)
HCT: 24 % — ABNORMAL LOW (ref 39.0–52.0)
Hemoglobin: 8.2 g/dL — ABNORMAL LOW (ref 13.0–17.0)
O2 Saturation: 39 %
Potassium: 3.5 mmol/L (ref 3.5–5.1)
Sodium: 138 mmol/L (ref 135–145)
TCO2: 33 mmol/L — ABNORMAL HIGH (ref 22–32)
pCO2, Ven: 51.3 mmHg (ref 44–60)
pH, Ven: 7.393 (ref 7.25–7.43)
pO2, Ven: 23 mmHg — CL (ref 32–45)

## 2023-09-09 LAB — PREPARE RBC (CROSSMATCH)

## 2023-09-09 LAB — MAGNESIUM: Magnesium: 1.7 mg/dL (ref 1.7–2.4)

## 2023-09-09 LAB — PHOSPHORUS: Phosphorus: 4.8 mg/dL — ABNORMAL HIGH (ref 2.5–4.6)

## 2023-09-09 MED ORDER — DOXERCALCIFEROL 4 MCG/2ML IV SOLN
1.0000 ug | INTRAVENOUS | Status: DC
  Administered 2023-09-11: 1 ug via INTRAVENOUS
  Filled 2023-09-09 (×4): qty 2

## 2023-09-09 MED ORDER — SODIUM CHLORIDE 0.9% IV SOLUTION: Freq: Once | INTRAVENOUS | Status: AC

## 2023-09-09 MED ORDER — POTASSIUM CHLORIDE CRYS ER 20 MEQ PO TBCR
40.0000 meq | EXTENDED_RELEASE_TABLET | Freq: Once | ORAL | Status: AC
  Administered 2023-09-09: 40 meq via ORAL
  Filled 2023-09-09: qty 2

## 2023-09-09 NOTE — Consult Note (Signed)
 Renal Service Consult Note [facility]  [name] [name] 09/09/2023 Maree Krabbe, MD Requesting Physician: Dr. Tyson Babinski  Reason for Consult: ESRD patient status post bilateral AKA surgery HPI: The patient is a 68 y.o. year-old w/ PMH as below who presented to the hospital on 3/17 complaining of worsening wound with foul smell and drainage from the right foot.  In the ED patient was seen by [facility] Dr. Sherral Hammers.  Vital signs were stable temp was 100.7.  Per [facility] patient had nonhealing right leg ulcer with progressive gangrene and he was started on IV antibiotics and admitted to the medical service.  He had had his dialysis earlier in the day on Monday.  The patient only does 2 dialysis days a week Monday and Friday.  Patient went to surgery yesterday 318 and the right leg was amputated above the knee and the left BKA was amputated also above the knee.  Patient is recovering and we are asked to see him for dialysis   Pt seen patient seen in hospital room.  He has been on dialysis for 2 or 3 years, he says it was a complication of his heart surgery.  He goes to dialysis on Monday and Fridays.  He has a fistula in his right arm which infiltrated recently when he tried to pull up the covers on him at the outpatient unit with his access arm.  Nevertheless the fistula is working and the bruising is still obvious on the arm.  He denies any shortness of breath, orthopnea DOE.   PMH Hypertension Combined systolic diastolic congestive heart failure CAD SP CABG x 3 Diabetes with complications Sleep apnea Hyperlipidemia Paroxysmal atrial fibrillation Chronic anticoagulation Depression   ROS - denies CP, no joint pain, no HA, no blurry vision, no rash, no diarrhea, no nausea/ vomiting   Past Medical History  Past Medical History:  Diagnosis Date   Acute kidney failure, unspecified (HCC) 12/05/2021   Anemia 04/17/2022   Blood transfusion   Atherosclerosis of native arteries of  extremities with rest pain, right leg (HCC) 03/23/2023   Atherosclerotic heart disease    Benign essential HTN 07/30/2014   Bradycardia 01/25/2015   Cardiomyopathy, unspecified (HCC) 12/03/2021   CHF (congestive heart failure) (HCC)    Chronic anticoagulation 02/25/2018   Chronic diastolic CHF (congestive heart failure) (HCC) 07/15/2021   Chronic systolic CHF (congestive heart failure), NYHA class 3 (HCC) 08/30/2014   Overview:  Global ef 30%   CKD (chronic kidney disease)    Class 3 severe obesity in adult (HCC) 04/28/2017   Coronary artery disease    Critical limb ischemia of right lower extremity (HCC) 03/19/2023   Depression    Diabetes mellitus without complication (HCC)    Diabetic neuropathy (HCC) 07/28/2014   Dysrhythmia    Erectile dysfunction    GERD (gastroesophageal reflux disease)    High risk medication use 05/26/2018   Hypertensive heart disease with heart failure (HCC) 07/30/2014   Leukocytosis 11/26/2021   Limb ischemia 01/26/2023   Lumbago    LV dysfunction 04/28/2017   Mixed hyperlipidemia 07/28/2014   OSA (obstructive sleep apnea)    CPAP   PAD (peripheral artery disease) (HCC) 01/26/2023   Paroxysmal atrial fibrillation (HCC) 07/28/2014   Peripheral arterial disease (HCC)    PFO (patent foramen ovale)    Postop check 01/06/2022   S/P CABG x 3 11/12/2021   Sinus node dysfunction (HCC) 04/21/2016   Testicular hypofunction    Uncontrolled type 2 diabetes mellitus with  microalbuminuric diabetic nephropathy 07/28/2014   Past Surgical History  Past Surgical History:  Procedure Laterality Date   ABDOMINAL AORTOGRAM W/LOWER EXTREMITY N/A 12/29/2022   Procedure: ABDOMINAL AORTOGRAM W/LOWER EXTREMITY;  Surgeon: Maeola Harman, MD;  Location: [facility] INVASIVE CV LAB;  Service: Cardiovascular;  Laterality: N/A;   ABDOMINAL AORTOGRAM W/LOWER EXTREMITY N/A 01/22/2023   Procedure: ABDOMINAL AORTOGRAM W/LOWER EXTREMITY;  Surgeon: [name], MD;   Location: [facility] INVASIVE CV LAB;  Service: Cardiovascular;  Laterality: N/A;   ABDOMINAL AORTOGRAM W/LOWER EXTREMITY N/A 03/06/2023   Procedure: ABDOMINAL AORTOGRAM W/LOWER EXTREMITY;  Surgeon: Leonie Timoth, MD;  Location: [facility] INVASIVE CV LAB;  Service: Cardiovascular;  Laterality: N/A;   AMPUTATION Left 01/26/2023   Procedure: LEFT AMPUTATION BELOW KNEE;  Surgeon: Leonie Jawuan, MD;  Location: [facility] OR;  Service: Vascular;  Laterality: Left;  with regional block   AMPUTATION Bilateral 09/08/2023   Procedure: BILATERAL AMPUTATION, ABOVE KNEE;  Surgeon: Maeola Harman, MD;  Location: [facility] OR;  Service: Vascular;  Laterality: Bilateral;   APPLICATION OF WOUND VAC Left 01/26/2023   Procedure: APPLICATION OF WOUND VAC, LEFT BELOW THE KNEE AMPUTATION STUMP;  Surgeon: Leonie Fritz, MD;  Location: [facility] OR;  Service: Vascular;  Laterality: Left;  With regional block   APPLICATION OF WOUND VAC Left 02/10/2023   Procedure: APPLICATION OF WOUND VAC;  Surgeon: Chuck Hint, MD;  Location: [facility] OR;  Service: Vascular;  Laterality: Left;   AV FISTULA PLACEMENT Right 05/20/2022   Procedure: RIGHT ARM ARTERIOVENOUS (AV) FISTULA CREATION;  Surgeon: Larina Earthly, MD;  Location: [facility] OR;  Service: Vascular;  Laterality: Right;   BUBBLE STUDY  11/21/2021   Procedure: BUBBLE STUDY;  Surgeon: Laurey Morale, MD;  Location: [facility] ENDOSCOPY;  Service: Cardiovascular;;   CARDIAC CATHETERIZATION     CARDIOVERSION N/A 11/21/2021   Procedure: CARDIOVERSION;  Surgeon: Laurey Morale, MD;  Location: [facility] ENDOSCOPY;  Service: Cardiovascular;  Laterality: N/A;   CARDIOVERSION N/A 11/25/2021   Procedure: CARDIOVERSION;  Surgeon: Laurey Morale, MD;  Location: [facility] ENDOSCOPY;  Service: Cardiovascular;  Laterality: N/A;   CARDIOVERSION N/A 11/29/2021   Procedure: CARDIOVERSION;  Surgeon: Laurey Morale, MD;  Location: [facility] ENDOSCOPY;  Service: Cardiovascular;  Laterality: N/A;   CARDIOVERSION N/A 02/27/2022    Procedure: CARDIOVERSION;  Surgeon: Little Ishikawa, MD;  Location: [facility] ENDOSCOPY;  Service: Cardiovascular;  Laterality: N/A;   CORONARY ARTERY BYPASS GRAFT N/A 11/12/2021   Procedure: CORONARY ARTERY BYPASS GRAFTING (CABG) x  3 USING LEFT INTERNAL MAMMARY ARTERY AND RIGHT GREATER SAPHENOUS VEIN;  Surgeon: Lovett Sox, MD;  Location: [facility] OR;  Service: Open Heart Surgery;  Laterality: N/A;   ELECTROPHYSIOLOGIC STUDY N/A 01/18/2015   Procedure: CARDIOVERSION;  Surgeon: Lamar Blinks, MD;  Location: [facility] ORS;  Service: Cardiovascular;  Laterality: N/A;   ENDOVEIN HARVEST OF GREATER SAPHENOUS VEIN  11/12/2021   Procedure: ENDOVEIN HARVEST OF GREATER SAPHENOUS VEIN;  Surgeon: Lovett Sox, MD;  Location: [facility] OR;  Service: Open Heart Surgery;;   EYE SURGERY Bilateral 2022   FEMORAL-POPLITEAL BYPASS GRAFT Right 03/19/2023   Procedure: RIGHT COMMON FEMORAL-BELOW KNEE POPLITEAL ARTERY BYPASS;  Surgeon: Leonie Treyshawn, MD;  Location: [facility] OR;  Service: Vascular;  Laterality: Right;   FISTULA SUPERFICIALIZATION Right 07/10/2022   Procedure: RIGHT ARM ARTERIOVENOUS FISTULA SUPERFICIALIZATION;  Surgeon: Maeola Harman, MD;  Location: [facility] OR;  Service: Vascular;  Laterality: Right;   IR FLUORO GUIDE CV LINE RIGHT  11/28/2021   IR US  GUIDE VASC ACCESS RIGHT  11/28/2021   LEFT HEART CATH AND CORONARY ANGIOGRAPHY N/A 10/24/2021   Procedure: LEFT HEART CATH AND CORONARY ANGIOGRAPHY;  Surgeon: Corky Crafts, MD;  Location: [facility] INVASIVE CV LAB;  Service: Cardiovascular;  Laterality: N/A;   MAZE N/A 11/12/2021   Procedure: MAZE;  Surgeon: Lovett Sox, MD;  Location: [facility] OR;  Service: Open Heart Surgery;  Laterality: N/A;   PERIPHERAL VASCULAR ATHERECTOMY  12/29/2022   Procedure: PERIPHERAL VASCULAR ATHERECTOMY;  Surgeon: Maeola Harman, MD;  Location: [facility] INVASIVE CV LAB;  Service: Cardiovascular;;   PERIPHERAL VASCULAR INTERVENTION  12/29/2022   Procedure: PERIPHERAL VASCULAR  INTERVENTION;  Surgeon: Maeola Harman, MD;  Location: [facility] INVASIVE CV LAB;  Service: Cardiovascular;;  Left SFA, Bilateral illiac   PERIPHERAL VASCULAR INTERVENTION Right 03/06/2023   Procedure: PERIPHERAL VASCULAR INTERVENTION;  Surgeon: Leonie Izek, MD;  Location: [facility] INVASIVE CV LAB;  Service: Cardiovascular;  Laterality: Right;  SFA   STUMP REVISION Left 02/10/2023   Procedure: LEFT BELOW KNEE AMPUTATION REVISION;  Surgeon: Chuck Hint, MD;  Location: [facility] OR;  Service: Vascular;  Laterality: Left;   TEE WITHOUT CARDIOVERSION N/A 11/12/2021   Procedure: TRANSESOPHAGEAL ECHOCARDIOGRAM (TEE);  Surgeon: Lovett Sox, MD;  Location: [facility] OR;  Service: Open Heart Surgery;  Laterality: N/A;   TEE WITHOUT CARDIOVERSION N/A 11/21/2021   Procedure: TRANSESOPHAGEAL ECHOCARDIOGRAM (TEE);  Surgeon: Laurey Morale, MD;  Location: [facility] ENDOSCOPY;  Service: Cardiovascular;  Laterality: N/A;   Family History  Family History  Problem Relation Age of Onset   CAD Brother    Drug abuse Brother    Lung cancer Father    CAD Mother    Social History  reports that he quit smoking about 18 years ago. His smoking use included cigarettes. He has never used smokeless tobacco. He reports current drug use. Frequency: 3.00 times per week. Drug: Marijuana. He reports that he does not drink alcohol. Allergies  Allergies  Allergen Reactions   Tape Rash    Plastic tape -needs paper tape    Home medications Prior to Admission medications   Medication Sig Start Date End Date Taking? Authorizing Provider  albuterol (VENTOLIN HFA) 108 (90 Base) MCG/ACT inhaler Inhale 2 puffs into the lungs every 6 (six) hours as needed for wheezing or shortness of breath.   Yes [provider]  amiodarone (PACERONE) 200 MG tablet TAKE 1 TABLET TWICE DAILY 03/31/23  Yes Baldo Daub, MD  apixaban (ELIQUIS) 5 MG TABS tablet Take 1 tablet (5 mg total) by mouth 2 (two) times daily. 02/18/23  Yes Vann,  Jessica U, DO  atorvastatin (LIPITOR) 80 MG tablet Take 1 tablet (80 mg total) by mouth daily. 06/25/23  Yes Baldo Daub, MD  B Complex-C-Folic Acid (DIALYVITE 800) 0.8 MG TABS Take 0.8 mg by mouth daily. 12/16/21  Yes [provider]  Dulaglutide (TRULICITY) 3 MG/0.5ML SOPN Inject 3 mg into the skin every Wednesday.   Yes [provider]  fluticasone furoate-vilanterol (BREO ELLIPTA) 200-25 MCG/ACT AEPB Inhale 2 puffs into the lungs daily. 01/02/18  Yes [provider]  insulin glargine (LANTUS) 100 UNIT/ML injection Inject 0.2 mLs (20 Units total) into the skin 2 (two) times daily. 02/12/23  Yes Vann, Jessica U, DO  midodrine (PROAMATINE) 5 MG tablet TAKE 1 TABLET EVERY DAY 06/15/23  Yes Baldo Daub, MD  montelukast (SINGULAIR) 10 MG tablet Take 10 mg by mouth daily. 08/20/23  Yes [provider]  Accu-Chek Softclix Lancets  lancets 1 each by Other route daily. 05/28/23   [provider]  Blood Glucose Monitoring Suppl (ACCU-CHEK GUIDE ME) w/Device KIT 1 Device by Other route 3 (three) times daily. 02/13/23   [provider]  collagenase (SANTYL) 250 UNIT/GM ointment Apply 1 Application topically daily. Patient not taking: Reported on 09/07/2023 05/05/23   Lars Mage, PA-C  glucose blood test strip 1 each by Other route 3 (three) times daily. 04/20/23   [provider]  Insulin Pen Needle 32G X 4 MM MISC Use to inject Levemir 2 (two) times daily. 12/03/21   Ardelle Balls, PA-C  torsemide (DEMADEX) 20 MG tablet TAKE 1 TABLET EVERY DAY Patient taking differently: Take 40 mg by mouth 2 (two) times daily. 08/10/23   Baldo Daub, MD     Vitals:   09/09/23 1058 09/09/23 1121 09/09/23 1136 09/09/23 1446  BP: (!) 102/44  (!) 101/41 109/67  Pulse: (!) 53 (!) 53 (!) 53 (!) 54  Resp: 20 17 19 17   Temp: 97.6 F (36.4 C)  (!) 97.3 F (36.3 C) (!) 97.4 F (36.3 C)  TempSrc: Axillary  Axillary Axillary  SpO2: 97% 98% 92% 100%   Weight:      Height:       Exam Gen alert, no distress No rash, cyanosis or gangrene Sclera anicteric, throat clear  No jvd or bruits Chest clear bilat to bases, no rales/ wheezing RRR no MRG Abd soft ntnd no mass or ascites +bs GU nl male MS bilat AKA w/ wrapped stumps Ext no LE or UE edema, no other edema Neuro is alert, Ox 3 , nf    RUA AVF +bruit, +bruising      Renal-related home meds: Midodrine to 5 mg daily  Torsemide 40 mg twice daily Others: Statin, Singulair, Lantus insulin, Breo elliptica, dulaglutide, Eliquis, amiodarone    OP HD: [facility] MWF (pt says it is M-F)  3h  B400   101kg  2K bath   RUA AVF  Heparin none - hectorol 1 mcg  - mircera 225 mcg q 2 wks, last 3/07    Assessment/ Plan: Gangrene right lower leg: Progressive issue, also nonhealing left BKA site.  Patient underwent bilateral AKA on 09/08/2023.  Per [facility]. ESRD: on HD M-F.  Next dialysis Friday.  Patient states he only does dialysis 2 times a week, and this has been the pattern looking at the out patient records.  Although he is listed as MWF in provider hub. Chronic hypotension: On midodrine at home, continue here Volume: Patient is up 2 kg by with weight, and his dry weight should be 3 or 4 kg down given bilateral amputations.  Does not look volume overloaded at all on exam.  UF 2 to 3 kg with next dialysis Anemia of esrd:.  Hemoglobin 6.7-8.5 here, has received PRBCs here. Secondary hyperparathyroidism: Corrected calcium is in range. Will add on phos and continue any binders with meals. PAF Chronic systolic HF      Rob Antonina Deziel  MD [facility] 09/09/2023, 3:28 PM  Recent Labs  Lab 09/07/23 1936 09/08/23 0500 09/08/23 1317 09/08/23 2113 09/09/23 0709  HGB 8.0* 6.9* 8.2*  --  6.7*  ALBUMIN 1.7* 1.8*  --   --   --   CALCIUM 7.6* 7.4*  --  7.6* 6.8*  CREATININE 3.52* 4.16*  --  4.91* 5.49*  K 2.5* 2.9* 3.5 3.7 3.3*   Inpatient medications:  atorvastatin  80 mg Oral Daily   insulin  aspart  0-9 Units Subcutaneous Q4H    ceFEPime (MAXIPIME) IV     linezolid (ZYVOX) IV 600 mg (09/09/23 0913)   acetaminophen **OR** acetaminophen, albuterol, HYDROcodone-acetaminophen, HYDROmorphone (DILAUDID) injection, ondansetron **OR** ondansetron (ZOFRAN) IV

## 2023-09-09 NOTE — Plan of Care (Signed)
  Problem: Fluid Volume: Goal: Ability to maintain a balanced intake and output will improve Outcome: Progressing   Problem: Health Behavior/Discharge Planning: Goal: Ability to identify and utilize available resources and services will improve Outcome: Progressing   Problem: Metabolic: Goal: Ability to maintain appropriate glucose levels will improve Outcome: Progressing   Problem: Nutritional: Goal: Maintenance of adequate nutrition will improve Outcome: Progressing   Problem: Nutritional: Goal: Progress toward achieving an optimal weight will improve Outcome: Progressing   Problem: Skin Integrity: Goal: Risk for impaired skin integrity will decrease Outcome: Progressing   Problem: Tissue Perfusion: Goal: Adequacy of tissue perfusion will improve Outcome: Progressing

## 2023-09-09 NOTE — Anesthesia Postprocedure Evaluation (Signed)
 Anesthesia Post Note  Patient: [name] [name]  Procedure(s) Performed: BILATERAL AMPUTATION, ABOVE KNEE (Bilateral: Knee)     Patient location during evaluation: PACU Anesthesia Type: General Level of consciousness: awake and alert Pain management: pain level controlled Vital Signs Assessment: post-procedure vital signs reviewed and stable Respiratory status: spontaneous breathing, nonlabored ventilation, respiratory function stable and patient connected to nasal cannula oxygen Cardiovascular status: blood pressure returned to baseline and stable Postop Assessment: no apparent nausea or vomiting Anesthetic complications: no   No notable events documented.  Last Vitals:  Vitals:   09/08/23 2352 09/09/23 0748  BP: (!) 107/57 (!) 106/48  Pulse: 60 (!) 58  Resp: 15 19  Temp: 36.8 C 36.7 C  SpO2: 100% 100%    Last Pain:  Vitals:   09/09/23 0933  TempSrc:   PainSc: 6                  Rafan Sanders S

## 2023-09-09 NOTE — Progress Notes (Signed)
  Progress Note    09/09/2023 8:43 AM 1 Day Post-Op  Subjective: Mild pain in bilateral amputation sites  Vitals:   09/08/23 2352 09/09/23 0748  BP: (!) 107/57 (!) 106/48  Pulse: 60 (!) 58  Resp: 15 19  Temp: 98.3 F (36.8 C) 98.1 F (36.7 C)  SpO2: 100% 100%    Physical Exam: Awake alert and oriented Nonlabored respirations Bilateral above-knee amputation sites with dressings clean dry intact  CBC    Component Value Date/Time   WBC 20.3 (H) 09/09/2023 0709   RBC 2.52 (L) 09/09/2023 0709   HGB 6.7 (LL) 09/09/2023 0709   HGB 7.6 (L) 04/17/2022 1452   HCT 21.2 (L) 09/09/2023 0709   HCT 25.3 (L) 04/17/2022 1452   PLT 344 09/09/2023 0709   PLT 351 04/17/2022 1452   MCV 84.1 09/09/2023 0709   MCV 88 04/17/2022 1452   MCH 26.6 09/09/2023 0709   MCHC 31.6 09/09/2023 0709   RDW 17.5 (H) 09/09/2023 0709   RDW 15.5 (H) 04/17/2022 1452   LYMPHSABS 0.0 (L) 09/07/2023 1936   LYMPHSABS 1.4 02/18/2022 1117   MONOABS 1.7 (H) 09/07/2023 1936   EOSABS 0.0 09/07/2023 1936   EOSABS 0.3 02/18/2022 1117   BASOSABS 0.3 (H) 09/07/2023 1936   BASOSABS 0.1 02/18/2022 1117    BMET    Component Value Date/Time   NA 133 (L) 09/09/2023 0709   NA 139 04/17/2022 1452   K 3.3 (L) 09/09/2023 0709   CL 95 (L) 09/09/2023 0709   CO2 28 09/09/2023 0709   GLUCOSE 135 (H) 09/09/2023 0709   BUN 28 (H) 09/09/2023 0709   BUN 42 (H) 04/17/2022 1452   CREATININE 5.49 (H) 09/09/2023 0709   CALCIUM 6.8 (L) 09/09/2023 0709   GFRNONAA 11 (L) 09/09/2023 0709   GFRAA 42 (L) 10/18/2019 1006    INR    Component Value Date/Time   INR 3.3 (H) 09/08/2023 0500     Intake/Output Summary (Last 24 hours) at 09/09/2023 0843 Last data filed at 09/09/2023 0610 Gross per 24 hour  Intake 2248.16 ml  Output 500 ml  Net 1748.16 ml     Assessment:  68 y.o. male is s/p bilateral above-knee amputations 1 Day Post-Op  Plan: Dressings down tomorrow  Apolinar Junes C. Randie Heinz, MD [facility] Office: [phone] Pager: [phone]  09/09/2023 8:43 AM

## 2023-09-09 NOTE — Progress Notes (Signed)
   09/09/23 2240  BiPAP/CPAP/SIPAP  Reason BIPAP/CPAP not in use Non-compliant (Patients wants to wear [place]. VSS)  BiPAP/CPAP /SiPAP Vitals  Pulse Rate 61  Resp (!) 33  SpO2 99 %  Bilateral Breath Sounds Clear;Diminished  MEWS Score/Color  MEWS Score 2  MEWS Score Color Yellow

## 2023-09-09 NOTE — Progress Notes (Addendum)
 PROGRESS NOTE  [name] [name] ZOX:[id number] DOB: Mar 22, 1956 DOA: 09/07/2023 PCP: [name], PA-C   LOS: 2 days   Brief narrative:   [name] [name] is a 68 y.o. male with medical history significant of end-stage renal disease on hemodialysis Monday Wednesday Friday, anemia, hypertension, history of coronary artery disease status post CABG, type 2 diabetes, congestive heart failure with ejection fraction of 30%, obstructive sleep apnea on CPAP, paroxysmal atrial fibrillation, history of severe peripheral vascular disease with critical limb ischemia status post atherectomy and stenting of left SFA and popliteal artery on 12/29/2022 by Dr. Randie Heinz subsequently followed by left BKA on 01/26/2023.  Also had right heel ulcer leading to right common femoral to below-knee popliteal artery bypass on  03/19/2023. Since then patient has had difficulty with healing of both his left BKA wound as well as non-healing ulcer on right heel with foul-smelling discharge from the right foot.  Patient then presented to hospital.  In the ED patient was seen by vascular surgery and was noted to have severe necrotic changes to the right lower extremities and nonhealing below-knee amputation on the left.  Patient was also febrile and slightly hypotensive and lactic acid was elevated at 2.5.  Chest x-ray showed masslike opacity in the suprahilar region of the left.  Right foot x-ray showed diffuse soft tissue edema and extensive subcu gas with destructive changes of the first metatarsal joint likely osteomyelitis and septic arthritis.  Patient was given cefepime and Zyvox and was considered for admission to the hospital for further evaluation and treatment.   Assessment/Plan: Principal Problem:   Gangrene of lower extremity (HCC) Active Problems:   Mixed hyperlipidemia   Paroxysmal atrial fibrillation (HCC)   Chronic diastolic CHF (congestive heart failure) (HCC)   S/P CABG x 3   End stage renal disease (HCC)      Nonhealing right lower extremity ulcer with progressive gangrene  Patient with history of severe PVD with multiple intervention in the past.  Currently on Zyvox and cefepime.   MRI of the right ankle and foot with osteomyelitis and soft tissue ulceration.  Vascular surgery has seen the patient and patient has undergone right above-knee amputation on 09/08/2023.  Spoke with vascular surgery Dr. Randie Heinz and recommend antibiotics 48 to 72 hours after surgery.  Nonhealing left below-knee amputation site.  Vascular surgery on board.  Patient underwent left above-knee amputation on 09/08/2023.  Follow vascular surgery recommendation.  ESRD MWF Nephrology has been consulted for hemodialysis.  Continue midodrine.  Will inform nephrology for hemodialysis needs.  Patient states that he goes to dialysis twice a week.    Mild hypokalemia.  Will give 1 dose of p.o. potassium today.  Can be adjusted with hemodialysis.   Acute blood loss anemia on anemia of chronic kidney disease.  Hemoglobin today at 6.7..  Will give 1 unit of packed RBC. Check CBC in am.  Could get transfusion with dialysis too.   History of hypotension  Currently on midodrine.  Blood pressure is stable at this time.    CAD s/p CABG No active issues at this time.  Continue Lipitor   Diabetes mellitus type 2. Continue sliding scale insulin, on regular diet at this time.  Glycemic control is adequate.  Chronic systolic heart failure. Continue hemodialysis for fluid management.  Appears to be compensated at this time.   OSA Continue CPAP at bedtime.   Paroxysmal atrial fibrillation. -continue on amiodarone,  Communicated with Dr. Randie Heinz and recommend to hold Eliquis today, significant  anemia at this time requiring blood transfusion..  Rate controlled at this time.   COPD -continue on breo and bronchodilators.  Appears compensated.  Class I obesity.  Body mass index is 32.71 kg/m. Would benefit from ongoing weight loss as  outpatient.  DVT prophylaxis: Eliquis on hold due to significant anemia.   Disposition: Uncertain at this time.  Might need rehabilitation.  Status is: Inpatient  Remains inpatient appropriate because: Status post bilateral above-knee amputation,    Code Status:     Code Status: Full Code  Family Communication: None at bedside  Consultants: Vascular surgery Nephrology  Procedures: Bilateral above-knee amputation on 09/08/2023  Anti-infectives:  Zyvox and cefepime  Anti-infectives (From admission, onward)    Start     Dose/Rate Route Frequency Ordered Stop   09/08/23 1800  ceFEPIme (MAXIPIME) 2 g in sodium chloride 0.9 % 100 mL IVPB  Status:  Discontinued        2 g 200 mL/hr over 30 Minutes Intravenous Every 24 hours 09/07/23 2236 09/08/23 0906   09/08/23 1800  ceFEPIme (MAXIPIME) 1 g in sodium chloride 0.9 % 100 mL IVPB        1 g 200 mL/hr over 30 Minutes Intravenous Every 24 hours 09/08/23 0906     09/08/23 1000  linezolid (ZYVOX) IVPB 600 mg        600 mg 300 mL/hr over 60 Minutes Intravenous Every 12 hours 09/08/23 0658     09/08/23 0946  ceFAZolin (ANCEF) 2-4 GM/100ML-% IVPB       Note to Pharmacy: Shanda Bumps M: cabinet override      09/08/23 0946 09/08/23 2159   09/07/23 1930  linezolid (ZYVOX) IVPB 600 mg        600 mg 300 mL/hr over 60 Minutes Intravenous  Once 09/07/23 1923 09/07/23 2255   09/07/23 1815  clindamycin (CLEOCIN) IVPB 600 mg  Status:  Discontinued        600 mg 100 mL/hr over 30 Minutes Intravenous  Once 09/07/23 1808 09/07/23 1923   09/07/23 1745  ceFEPIme (MAXIPIME) 2 g in sodium chloride 0.9 % 100 mL IVPB        2 g 200 mL/hr over 30 Minutes Intravenous  Once 09/07/23 1734 09/07/23 2031   09/07/23 1745  vancomycin (VANCOCIN) IVPB 1000 mg/200 mL premix  Status:  Discontinued        1,000 mg 200 mL/hr over 60 Minutes Intravenous  Once 09/07/23 1734 09/07/23 1741   09/07/23 1745  vancomycin (VANCOREADY) IVPB 2000 mg/400 mL  Status:   Discontinued        2,000 mg 200 mL/hr over 120 Minutes Intravenous  Once 09/07/23 1741 09/07/23 1933       Subjective: Today, patient was seen and examined at bedside.  Patient states that he has some discomfort over his leg.  Had a bowel movement yesterday.  Has been feeling hot.  Some nausea.  Denies any fever chills or rigor.   Objective: Vitals:   09/09/23 0748 09/09/23 1058  BP: (!) 106/48 (!) 102/44  Pulse: (!) 58 (!) 53  Resp: 19 20  Temp: 98.1 F (36.7 C) 97.6 F (36.4 C)  SpO2: 100% 97%    Intake/Output Summary (Last 24 hours) at 09/09/2023 1116 Last data filed at 09/09/2023 0610 Gross per 24 hour  Intake 1933.16 ml  Output 500 ml  Net 1433.16 ml   Filed Weights   09/07/23 1542 09/08/23 1008  Weight: 102.1 kg 103.4 kg   Body mass index  is 32.71 kg/m.   Physical Exam:  GENERAL: Patient is alert awake and oriented. Not in obvious distress.  Obese built HENT: Pallor noted.  Pupils equally reactive to light. Oral mucosa is moist NECK: is supple, no gross swelling noted. CHEST: Clear to auscultation. No crackles or wheezes.   CVS: S1 and S2 heard, no murmur. Regular rate and rhythm.  ABDOMEN: Soft, non-tender, bowel sounds are present. EXTREMITIES: Bilateral above-knee amputation with stump covered with Kerlix bandage. CNS: Cranial nerves are intact. SKIN: warm and dry  Data Review: I have personally reviewed the following laboratory data and studies,  CBC: Recent Labs  Lab 09/07/23 1936 09/08/23 0500 09/09/23 0709  WBC 27.9* 23.8* 20.3*  NEUTROABS 25.9*  --   --   HGB 8.0* 6.9* 6.7*  HCT 26.4* 23.2* 21.2*  MCV 81.0 82.6 84.1  PLT 550* 448* 344   Basic Metabolic Panel: Recent Labs  Lab 09/07/23 1936 09/08/23 0500 09/08/23 2113 09/09/23 0709  NA 139 138 136 133*  K 2.5* 2.9* 3.7 3.3*  CL 96* 97* 98 95*  CO2 30 31 26 28   GLUCOSE 125* 102* 136* 135*  BUN 16 19 25* 28*  CREATININE 3.52* 4.16* 4.91* 5.49*  CALCIUM 7.6* 7.4* 7.6* 6.8*  MG  --    --   --  1.7   Liver Function Tests: Recent Labs  Lab 09/07/23 1936 09/08/23 0500  AST 79* 66*  ALT 49* 40  ALKPHOS 71 54  BILITOT 0.9 1.0  PROT 7.2 6.2*  ALBUMIN 1.7* 1.8*   No results for input(s): "LIPASE", "AMYLASE" in the last 168 hours. No results for input(s): "AMMONIA" in the last 168 hours. Cardiac Enzymes: No results for input(s): "CKTOTAL", "CKMB", "CKMBINDEX", "TROPONINI" in the last 168 hours. BNP (last 3 results) Recent Labs    02/05/23 1408 09/07/23 1936  BNP 553.7* 484.4*    ProBNP (last 3 results) No results for input(s): "PROBNP" in the last 8760 hours.  CBG: Recent Labs  Lab 09/08/23 1754 09/08/23 2014 09/08/23 2350 09/09/23 0337 09/09/23 0746  GLUCAP 124* 129* 178* 127* 132*   Recent Results (from the past 240 hours)  Culture, blood (Routine x 2)     Status: None (Preliminary result)   Collection Time: 09/07/23  3:52 PM   Specimen: BLOOD  Result Value Ref Range Status   Specimen Description BLOOD SITE NOT SPECIFIED  Final   Special Requests   Final    BOTTLES DRAWN AEROBIC AND ANAEROBIC Blood Culture results may not be optimal due to an inadequate volume of blood received in culture bottles   Culture   Final    NO GROWTH 2 DAYS Performed at [facility], [address] [place], [place] [address]    Report Status PENDING  Incomplete  Culture, blood (Routine x 2)     Status: None (Preliminary result)   Collection Time: 09/07/23  7:36 PM   Specimen: BLOOD  Result Value Ref Range Status   Specimen Description BLOOD SITE NOT SPECIFIED  Final   Special Requests   Final    BOTTLES DRAWN AEROBIC AND ANAEROBIC Blood Culture results may not be optimal due to an inadequate volume of blood received in culture bottles   Culture   Final    NO GROWTH 2 DAYS Performed at [facility], [address] [place], [place] [address]    Report Status PENDING  Incomplete  Resp panel by RT-PCR (RSV, Flu A&B, Covid) Peripheral  Status: None    Collection Time: 09/07/23  7:36 PM   Specimen: Peripheral; Nasal Swab  Result Value Ref Range Status   SARS Coronavirus 2 by RT PCR NEGATIVE NEGATIVE Final   Influenza A by PCR NEGATIVE NEGATIVE Final   Influenza B by PCR NEGATIVE NEGATIVE Final    Comment: (NOTE) The Xpert Xpress SARS-CoV-2/FLU/RSV plus assay is intended as an aid in the diagnosis of influenza from Nasopharyngeal swab specimens and should not be used as a sole basis for treatment. Nasal washings and aspirates are unacceptable for Xpert Xpress SARS-CoV-2/FLU/RSV testing.  Fact Sheet for Patients: BloggerCourse.com  Fact Sheet for Healthcare Providers: SeriousBroker.it  This test is not yet approved or cleared by the [place] FDA and has been authorized for detection and/or diagnosis of SARS-CoV-2 by FDA under an Emergency Use Authorization (EUA). This EUA will remain in effect (meaning this test can be used) for the duration of the COVID-19 declaration under Section 564(b)(1) of the Act, 21 U.S.C. section 360bbb-3(b)(1), unless the authorization is terminated or revoked.     Resp Syncytial Virus by PCR NEGATIVE NEGATIVE Final    Comment: (NOTE) Fact Sheet for Patients: BloggerCourse.com  Fact Sheet for Healthcare Providers: SeriousBroker.it  This test is not yet approved or cleared by the [place] FDA and has been authorized for detection and/or diagnosis of SARS-CoV-2 by FDA under an Emergency Use Authorization (EUA). This EUA will remain in effect (meaning this test can be used) for the duration of the COVID-19 declaration under Section 564(b)(1) of the Act, 21 U.S.C. section 360bbb-3(b)(1), unless the authorization is terminated or revoked.  Performed at [facility], [address] [place], [place] [address]      Studies: MR ANKLE RIGHT W WO CONTRAST Result Date:  09/08/2023 CLINICAL DATA:  Foot wound with radiographic findings consistent with osteomyelitis and gas-forming infection in the forefoot. Concern for necrotizing fasciitis. EXAM: MRI OF THE RIGHT ANKLE WITHOUT AND WITH CONTRAST TECHNIQUE: Multiplanar, multisequence MR imaging of the ankle was performed before and after the administration of intravenous contrast. CONTRAST:  10mL GADAVIST GADOBUTROL 1 MMOL/ML IV SOLN COMPARISON:  Foot radiographs 09/07/2023 and 05/07/2020. MRI 05/21/2020. FINDINGS: Forefoot findings dictated separately TENDONS Peroneal: Intact and normally positioned. Posteromedial: Intact and normally positioned. Anterior: There is tendinosis and high-grade partial tearing of the tibialis anterior tendon distally near its insertion. No significant tenosynovitis. The additional anterior extensor tendons appear normal. Achilles: Intact. Plantar Fascia: Intact. LIGAMENTS Lateral: The anterior and posterior talofibular and calcaneofibular ligaments are intact.The inferior tibiofibular ligaments appear intact. Medial: The deltoid and visualized portions of the spring ligament appear intact. CARTILAGE AND BONES Ankle Joint: No significant ankle joint effusion or suspicious synovial enhancement. The talar dome and tibial plafond appear normal. Subtalar Joints/Sinus Tarsi: Unremarkable. Bones: No evidence of acute fracture, dislocation or osteonecrosis within the ankle or hindfoot. Midfoot and forefoot findings are dictated separately. There are new findings suspicious for osteomyelitis at the 1st tarsometatarsal joint. Other: Nonspecific diffuse soft tissue edema and enhancement throughout the ankle and hindfoot. No focal fluid collections or definite soft tissue emphysema identified within the ankle. IMPRESSION: IMPRESSION 1. Nonspecific diffuse soft tissue edema and enhancement throughout the ankle and hindfoot, suspicious for cellulitis. No focal fluid collections or definite soft tissue emphysema  identified within the ankle. 2. New findings suspicious for osteomyelitis at the 1st tarsometatarsal joint. See additional details on separate report for the forefoot. 3. Tendinosis and high-grade partial tearing of the tibialis anterior tendon distally  near its insertion. The additional ankle tendons and ligaments appear intact. 4. Forefoot findings dictated separately. Electronically Signed   By: Carey Bullocks M.D.   On: 09/08/2023 08:49   MR FOOT RIGHT W WO CONTRAST Result Date: 09/08/2023 CLINICAL DATA:  Foot wound with radiographic findings consistent with osteomyelitis and gas-forming infection in the forefoot. Concern for necrotizing fasciitis. EXAM: MRI OF THE RIGHT FOREFOOT WITHOUT AND WITH CONTRAST TECHNIQUE: Multiplanar, multisequence MR imaging of the right forefoot was performed before and after the administration of intravenous contrast. CONTRAST:  10mL GADAVIST GADOBUTROL 1 MMOL/ML IV SOLN COMPARISON:  Radiographs same date. Concurrent MRI of the right ankle and hindfoot. FINDINGS: Ankle and hindfoot findings dictated separately. Bones/Joint/Cartilage As demonstrated radiographically, there are erosive changes with cortical destruction, T2 hyperintensity and heterogeneous enhancement within the 1st metatarsal head, adjacent base of the 1st proximal phalanx and head of the 5th metatarsal, suspicious for osteomyelitis. Similar additional changes are present at the 1st tarsometatarsal joint, also suspicious for osteomyelitis. There is less specific mild edema and enhancement within the middle cuneiform and base of the 2nd metatarsal which could be arthropathic. There is a small effusion of the 1st metatarsophalangeal joint which appears to communicate with a large open wound dorsally. The alignment is normal at the Lisfranc joint. Ligaments Intact Lisfranc ligament. The collateral ligaments of the 1st metatarsophalangeal joint are not well visualized. Muscles and Tendons As seen on the ankle  examination, there is a high-grade partial tear of the distal anterior tibialis tendon. In addition, the extensor hallucis longus tendon appears completely ruptured at the level of the large wound dorsal to the 1st metatarsal. Generalized muscular atrophy with soft tissue edema and heterogeneous enhancement. Soft tissues As above, large area of soft tissue ulceration along the dorsal aspect of the 1st metatarsophalangeal joint with an underlying fluid collection which demonstrates peripheral enhancement and appears to communicate with the joint. This fluid collection measures approximately 1.6 x 1.2 x 2.4 cm. Additional areas of soft tissue ulceration are noted medial to the 1st MTP joint. As above, there is heterogeneous enhancement throughout the forefoot soft tissues with scattered soft tissue emphysema and large areas of poor enhancement in the lateral and plantar aspects of the foot, highly suspicious for devitalized tissue and necrotizing soft tissue infection. IMPRESSION: 1. Findings are highly suspicious for osteomyelitis involving the 1st metatarsal head, adjacent base of the 1st proximal phalanx and head of the 5th metatarsal. Similar additional changes are present at the 1st tarsometatarsal joint, also suspicious for osteomyelitis. 2. Large area of soft tissue ulceration along the dorsal aspect of the 1st metatarsophalangeal joint with an underlying fluid collection which appears to communicate with the joint, suspicious for an abscess. Additional areas of soft tissue ulceration are noted medial to the 1st MTP joint. 3. Heterogeneous enhancement throughout the forefoot soft tissues with scattered soft tissue emphysema and large areas of poor enhancement in the lateral and plantar aspects of the foot, highly suspicious for devitalized tissue and necrotizing soft tissue infection. 4. High-grade partial tear of the distal anterior tibialis tendon. 5. Complete rupture of the extensor hallucis longus tendon at  the level of the large wound dorsal to the 1st metatarsal. 6. Ankle and hindfoot findings dictated separately. Electronically Signed   By: Carey Bullocks M.D.   On: 09/08/2023 08:49   DG Foot Complete Right Result Date: 09/07/2023 CLINICAL DATA:  Wound EXAM: RIGHT FOOT COMPLETE - 3+ VIEW COMPARISON:  04/27/2020 FINDINGS: Frontal, oblique, lateral views of the right  foot are obtained. There is diffuse soft tissue edema, with extensive subcutaneous gas throughout the forefoot compatible with gas-forming infection. There is bone destruction along the first metatarsal head, base of the first proximal phalanx, and fifth metatarsal head consistent with osteomyelitis. There are no acute fractures. Diffuse osteoarthritis. Extensive atherosclerosis. IMPRESSION: 1. Diffuse soft tissue edema compatible with cellulitis. Extensive subcutaneous gas throughout the forefoot consistent with gas forming infection. 2. Destructive changes surrounding the first metatarsophalangeal joint consistent with osteomyelitis and likely septic arthritis. Additional bony destruction of the fifth metatarsal head consistent with osteomyelitis. Electronically Signed   By: Sharlet Salina M.D.   On: 09/07/2023 17:13   DG Chest 2 View Result Date: 09/07/2023 CLINICAL DATA:  Systemic inflammatory response syndrome. EXAM: CHEST - 2 VIEW COMPARISON:  One-view x-ray 02/05/2023 FINDINGS: Status post median sternotomy. Stable cardiopericardial silhouette enlargement. Slight prominence of central vasculature. No pneumothorax or effusion. X-rays are limited. The upper aspect of the chest is clipped off the edge of the film on the frontal view and the inferior costophrenic angles are clipped on the lateral. Is also masslike opacity left perihilar. Underlying lesion is possible. Recommend follow-up chest CT with contrast when appropriate. IMPRESSION: Limited x-rays.  Postop chest. New masslike opacity along the suprahilar region on the left. Recommend a  follow up chest CT with contrast to further delineate and exclude underlying neoplasm or other aggressive lesion when appropriate. Electronically Signed   By: Karen Kays M.D.   On: 09/07/2023 17:13      Joycelyn Das, MD  [facility] 09/09/2023  If 7PM-7AM, please contact night-coverage

## 2023-09-09 NOTE — TOC CM/SW Note (Signed)
  Transition of Care [facility]) Screening Note   Patient Details  Name: [name] [name] Date of Birth: Mar 12, 1956   Transition of [facility]) CM/SW Contact:    Eduard Roux, LCSW Phone Number: 09/09/2023, 3:32 PM    Transition of Care Department [facility]) has reviewed patient and no TOC needs have been identified at this time. We will continue to monitor patient advancement through interdisciplinary progression rounds. If new patient transition needs arise, please place a TOC consult.

## 2023-09-10 DIAGNOSIS — I96 Gangrene, not elsewhere classified: Secondary | ICD-10-CM | POA: Diagnosis not present

## 2023-09-10 LAB — TYPE AND SCREEN
ABO/RH(D): A POS
Antibody Screen: NEGATIVE
Unit division: 0
Unit division: 0
Unit division: 0
Unit division: 0

## 2023-09-10 LAB — BPAM RBC
Blood Product Expiration Date: 202504122359
Blood Product Expiration Date: 202504072359
Blood Product Unit Number: 202504122359
Blood Product Unit Number: 202504122359
ISSUE DATE / TIME: 202503180841
ISSUE DATE / TIME: 202503181053
ISSUE DATE / TIME: 202504082359
ISSUING PHYSICIAN: 202503181053
PRODUCT CODE: 202503181708
PRODUCT CODE: 202504122359
Unit Type and Rh: 202503191117
Unit Type and Rh: 202503191117
Unit Type and Rh: 6200
Unit Type and Rh: 202504082359
Unit Type and Rh: 6200
Unit Type and Rh: 6200
Unit Type and Rh: 6200

## 2023-09-10 LAB — BASIC METABOLIC PANEL
Anion gap: 15 (ref 5–15)
BUN: 36 mg/dL — ABNORMAL HIGH (ref 8–23)
CO2: 23 mmol/L (ref 22–32)
Calcium: 6.5 mg/dL — ABNORMAL LOW (ref 8.9–10.3)
Chloride: 94 mmol/L — ABNORMAL LOW (ref 98–111)
Creatinine, Ser: 6.67 mg/dL — ABNORMAL HIGH (ref 0.61–1.24)
GFR, Estimated: 8 mL/min — ABNORMAL LOW (ref >60)
Glucose, Bld: 110 mg/dL — ABNORMAL HIGH (ref 70–99)
Potassium: 3.8 mmol/L (ref 3.5–5.1)
Sodium: 132 mmol/L — ABNORMAL LOW (ref 135–145)

## 2023-09-10 LAB — GLUCOSE, CAPILLARY
Glucose-Capillary: 123 mg/dL — ABNORMAL HIGH (ref 70–99)
Glucose-Capillary: 119 mg/dL — ABNORMAL HIGH (ref 70–99)
Glucose-Capillary: 144 mg/dL — ABNORMAL HIGH (ref 70–99)
Glucose-Capillary: 114 mg/dL — ABNORMAL HIGH (ref 70–99)
Glucose-Capillary: 181 mg/dL — ABNORMAL HIGH (ref 70–99)

## 2023-09-10 LAB — POCT I-STAT EG7
Acid-Base Excess: 8 mmol/L — ABNORMAL HIGH (ref 0.0–2.0)
Bicarbonate: 32.4 mmol/L — ABNORMAL HIGH (ref 20.0–28.0)
Calcium, Ion: 1.08 mmol/L — ABNORMAL LOW (ref 1.15–1.40)
HCT: 28 % — ABNORMAL LOW (ref 39.0–52.0)
Hemoglobin: 9.5 g/dL — ABNORMAL LOW (ref 13.0–17.0)
O2 Saturation: 90 %
Potassium: 3.7 mmol/L (ref 3.5–5.1)
Sodium: 138 mmol/L (ref 135–145)
TCO2: 34 mmol/L — ABNORMAL HIGH (ref 22–32)
pCO2, Ven: 46.5 mmHg (ref 44–60)
pH, Ven: 7.451 — ABNORMAL HIGH (ref 7.25–7.43)
pO2, Ven: 56 mmHg — ABNORMAL HIGH (ref 32–45)

## 2023-09-10 LAB — CBC
HCT: 25.4 % — ABNORMAL LOW (ref 39.0–52.0)
Hemoglobin: 7.9 g/dL — ABNORMAL LOW (ref 13.0–17.0)
MCH: 27.1 pg (ref 26.0–34.0)
MCHC: 31.1 g/dL (ref 30.0–36.0)
MCV: 87 fL (ref 80.0–100.0)
Platelets: 370 10*3/uL (ref 150–400)
RBC: 2.92 MIL/uL — ABNORMAL LOW (ref 4.22–5.81)
RDW: 17.8 % — ABNORMAL HIGH (ref 11.5–15.5)
WBC: 21.9 10*3/uL — ABNORMAL HIGH (ref 4.0–10.5)
nRBC: 0 % (ref 0.0–0.2)

## 2023-09-10 LAB — HEPATITIS B SURFACE ANTIGEN: Hepatitis B Surface Ag: NONREACTIVE

## 2023-09-10 LAB — SURGICAL PATHOLOGY

## 2023-09-10 LAB — MAGNESIUM: Magnesium: 1.8 mg/dL (ref 1.7–2.4)

## 2023-09-10 MED ORDER — CHLORHEXIDINE GLUCONATE CLOTH 2 % EX PADS
6.0000 | MEDICATED_PAD | Freq: Every day | CUTANEOUS | Status: DC
  Administered 2023-09-11 – 2023-09-18 (×6): 6 via TOPICAL

## 2023-09-10 MED ORDER — INSULIN ASPART 100 UNIT/ML IJ SOLN
0.0000 [IU] | Freq: Three times a day (TID) | INTRAMUSCULAR | Status: DC
  Administered 2023-09-11: 1 [IU] via SUBCUTANEOUS
  Administered 2023-09-11: 2 [IU] via SUBCUTANEOUS
  Administered 2023-09-12: 1 [IU] via SUBCUTANEOUS
  Administered 2023-09-13 – 2023-09-14 (×5): 2 [IU] via SUBCUTANEOUS
  Administered 2023-09-15 – 2023-09-16 (×3): 3 [IU] via SUBCUTANEOUS
  Administered 2023-09-16: 2 [IU] via SUBCUTANEOUS
  Administered 2023-09-17 (×2): 1 [IU] via SUBCUTANEOUS
  Administered 2023-09-17 – 2023-09-19 (×4): 2 [IU] via SUBCUTANEOUS
  Administered 2023-09-19: 5 [IU] via SUBCUTANEOUS
  Administered 2023-09-19 – 2023-09-20 (×3): 1 [IU] via SUBCUTANEOUS

## 2023-09-10 NOTE — Plan of Care (Signed)

## 2023-09-10 NOTE — Progress Notes (Addendum)
  Progress Note    09/10/2023 8:20 AM 2 Days Post-Op  Subjective: Endorses continued mild pain at his amputation sites   Vitals:   09/09/23 2328 09/10/23 0338  BP: (!) 117/49 (!) 103/46  Pulse: 60 65  Resp: 20 18  Temp: 98.3 F (36.8 C) 98.2 F (36.8 C)  SpO2: 100% 94%    Physical Exam: General: Alert and oriented, no acute distress Lungs: Nonlabored Incisions:  Dressings removed from bilateral amputation sites.  Both AKA's are healing well without hematoma.  Minimal bloody drainage from right AKA, moderate bloody drainage from left AKA  CBC    Component Value Date/Time   WBC 20.3 (H) 09/09/2023 0709   RBC 2.52 (L) 09/09/2023 0709   HGB 8.1 (L) 09/09/2023 2033   HGB 7.6 (L) 04/17/2022 1452   HCT 25.5 (L) 09/09/2023 2033   HCT 25.3 (L) 04/17/2022 1452   PLT 344 09/09/2023 0709   PLT 351 04/17/2022 1452   MCV 84.1 09/09/2023 0709   MCV 88 04/17/2022 1452   MCH 26.6 09/09/2023 0709   MCHC 31.6 09/09/2023 0709   RDW 17.5 (H) 09/09/2023 0709   RDW 15.5 (H) 04/17/2022 1452   LYMPHSABS 0.0 (L) 09/07/2023 1936   LYMPHSABS 1.4 02/18/2022 1117   MONOABS 1.7 (H) 09/07/2023 1936   EOSABS 0.0 09/07/2023 1936   EOSABS 0.3 02/18/2022 1117   BASOSABS 0.3 (H) 09/07/2023 1936   BASOSABS 0.1 02/18/2022 1117    BMET    Component Value Date/Time   NA 133 (L) 09/09/2023 0709   NA 139 04/17/2022 1452   K 3.3 (L) 09/09/2023 0709   CL 95 (L) 09/09/2023 0709   CO2 28 09/09/2023 0709   GLUCOSE 135 (H) 09/09/2023 0709   BUN 28 (H) 09/09/2023 0709   BUN 42 (H) 04/17/2022 1452   CREATININE 5.49 (H) 09/09/2023 0709   CALCIUM 6.8 (L) 09/09/2023 0709   GFRNONAA 11 (L) 09/09/2023 0709   GFRAA 42 (L) 10/18/2019 1006    INR    Component Value Date/Time   INR 3.3 (H) 09/08/2023 0500     Intake/Output Summary (Last 24 hours) at 09/10/2023 0820 Last data filed at 09/09/2023 1800 Gross per 24 hour  Intake 810 ml  Output --  Net 810 ml      Assessment/Plan:  68 y.o. male is  2 days post op, s/p: Bilateral above-knee amputations   -Continues to have some mild pain in his amputation sites.  He feels like he is regaining some mobility at his amputation sites -Dressings taken down this morning.  Some dried blood on both dressings, left greater than right.  Both AKA incisions look great without any dehiscence or hematoma -Mild bloody drainage of the right AKA and moderate drainage on the left.  Recommend dry dressing changes as needed until his drainage stops -He is cleared to work with therapies from our perspective   Loel Dubonnet, [place] [facility] [phone] 09/10/2023 8:20 AM   I have interviewed and examined patient with PA and agree with assessment and plan above.   Cidney Kirkwood C. Randie Heinz, MD [facility] Office: [phone] Pager: [phone]

## 2023-09-10 NOTE — Progress Notes (Signed)
 PROGRESS NOTE  [name] KGM:[id number] DOB: April 10, 1956 DOA: 09/07/2023 PCP: [name], PA-C   LOS: 3 days   Brief narrative:   [name] [name] is a 68 y.o. male with medical history significant of end-stage renal disease on hemodialysis Monday  Friday, anemia, hypertension, history of coronary artery disease status post CABG, type 2 diabetes, congestive heart failure with ejection fraction of 30%, obstructive sleep apnea on CPAP, paroxysmal atrial fibrillation, history of severe peripheral vascular disease with critical limb ischemia status post atherectomy and stenting of left SFA and popliteal artery on 12/29/2022 by Dr. Randie Heinz subsequently followed by left BKA on 01/26/2023.  Also had right heel ulcer leading to right common femoral to below-knee popliteal artery bypass on  03/19/2023. Since then patient has had difficulty with healing of both his left BKA wound as well as non-healing ulcer on right heel with foul-smelling discharge from the right foot.  Patient then presented to hospital.  In the ED patient was seen by vascular surgery and was noted to have severe necrotic changes to the right lower extremities and nonhealing below-knee amputation on the left.  Patient was also febrile and slightly hypotensive and lactic acid was elevated at 2.5.  Chest x-ray showed masslike opacity in the suprahilar region of the left.  Right foot x-ray showed diffuse soft tissue edema and extensive subcu gas with destructive changes of the first metatarsal joint likely osteomyelitis and septic arthritis.  Patient was given cefepime and Zyvox and was considered for admission to the hospital for further evaluation and treatment.   Assessment/Plan: Principal Problem:   Gangrene of lower extremity (HCC) Active Problems:   Mixed hyperlipidemia   Paroxysmal atrial fibrillation (HCC)   Chronic diastolic CHF (congestive heart failure) (HCC)   S/P CABG x 3   End stage renal disease (HCC)      Nonhealing right lower extremity ulcer with progressive gangrene  Patient with history of severe PVD with multiple intervention in the past.  Currently on Zyvox and cefepime.   MRI of the right ankle and foot with osteomyelitis and soft tissue ulceration.  Vascular surgery has seen the patient and patient has undergone right above-knee amputation on 09/08/2023.  Spoke with vascular surgery Dr. Randie Heinz on 09/09/2023 and recommend antibiotics 48 to 72 hours after surgery.  WBC today at 21.9 from 20.3.  No fever.  Nonhealing left below-knee amputation site.  Vascular surgery on board.  Patient underwent left above-knee amputation on 09/08/2023.  Follow vascular surgery recommendation.  [facility] has been consulted for hemodialysis.  Continue midodrine.  Patient states that he goes to dialysis twice a week.    Mild hypokalemia.  Received oral potassium yesterday.  Potassium today at 3.8.  Magnesium of 1.8.   Acute blood loss anemia on anemia of chronic kidney disease.  Hemoglobin today at 7.9 from 6.7, status post 1 unit of packed RBC.  Check CBC in a.m. include transfuse more if needed.   History of hypotension  Currently on midodrine.  Blood pressure is stable at this time.    CAD s/p CABG No active issues at this time.  Continue Lipitor   Diabetes mellitus type 2. Continue sliding scale insulin, on regular diet at this time.  Glycemic control is adequate.  Chronic systolic heart failure. Continue hemodialysis for fluid management.  Appears to be compensated at this time.   OSA Continue CPAP at bedtime.   Paroxysmal atrial fibrillation. -continue on amiodarone, will resume Eliquis when okay with vascular surgery.,  significant anemia at this time requiring blood transfusion..  Rate controlled at this time.   COPD -continue on breo and bronchodilators.  Appears compensated.  Class I obesity.  Body mass index is 32.71 kg/m. Would benefit from ongoing weight loss as outpatient.  DVT  prophylaxis: Eliquis on hold due to significant anemia.   Disposition: Uncertain at this time.  Might need rehabilitation.  Vascular surgery okay with PT OT evaluation.  Will request PT OT evaluation  Status is: Inpatient  Remains inpatient appropriate because: Status post bilateral above-knee amputation,    Code Status:     Code Status: Full Code  Family Communication: None at bedside  Consultants: Vascular surgery Nephrology  Procedures: Bilateral above-knee amputation on 09/08/2023  Anti-infectives:  Zyvox and cefepime  Anti-infectives (From admission, onward)    Start     Dose/Rate Route Frequency Ordered Stop   09/08/23 1800  ceFEPIme (MAXIPIME) 2 g in sodium chloride 0.9 % 100 mL IVPB  Status:  Discontinued        2 g 200 mL/hr over 30 Minutes Intravenous Every 24 hours 09/07/23 2236 09/08/23 0906   09/08/23 1800  ceFEPIme (MAXIPIME) 1 g in sodium chloride 0.9 % 100 mL IVPB        1 g 200 mL/hr over 30 Minutes Intravenous Every 24 hours 09/08/23 0906     09/08/23 1000  linezolid (ZYVOX) IVPB 600 mg        600 mg 300 mL/hr over 60 Minutes Intravenous Every 12 hours 09/08/23 0658     09/08/23 0946  ceFAZolin (ANCEF) 2-4 GM/100ML-% IVPB       Note to Pharmacy: Shanda Bumps M: cabinet override      09/08/23 0946 09/08/23 2159   09/07/23 1930  linezolid (ZYVOX) IVPB 600 mg        600 mg 300 mL/hr over 60 Minutes Intravenous  Once 09/07/23 1923 09/07/23 2255   09/07/23 1815  clindamycin (CLEOCIN) IVPB 600 mg  Status:  Discontinued        600 mg 100 mL/hr over 30 Minutes Intravenous  Once 09/07/23 1808 09/07/23 1923   09/07/23 1745  ceFEPIme (MAXIPIME) 2 g in sodium chloride 0.9 % 100 mL IVPB        2 g 200 mL/hr over 30 Minutes Intravenous  Once 09/07/23 1734 09/07/23 2031   09/07/23 1745  vancomycin (VANCOCIN) IVPB 1000 mg/200 mL premix  Status:  Discontinued        1,000 mg 200 mL/hr over 60 Minutes Intravenous  Once 09/07/23 1734 09/07/23 1741   09/07/23  1745  vancomycin (VANCOREADY) IVPB 2000 mg/400 mL  Status:  Discontinued        2,000 mg 200 mL/hr over 120 Minutes Intravenous  Once 09/07/23 1741 09/07/23 1933       Subjective: Today, patient was seen and examined at bedside.  Patient complains of mild discomfort over the stump.  Denies any chest pain, fever, chills or rigor.  Has some nausea but no vomiting.     Objective: Vitals:   09/10/23 0338 09/10/23 0824  BP: (!) 103/46 (!) 108/40  Pulse: 65 (!) 53  Resp: 18 18  Temp: 98.2 F (36.8 C) 97.6 F (36.4 C)  SpO2: 94% 99%    Intake/Output Summary (Last 24 hours) at 09/10/2023 1106 Last data filed at 09/10/2023 0823 Gross per 24 hour  Intake 1050 ml  Output --  Net 1050 ml   Filed Weights   09/07/23 1542 09/08/23 1008  Weight: 102.1  kg 103.4 kg   Body mass index is 32.71 kg/m.   Physical Exam:  GENERAL: Patient is alert awake and oriented. Not in obvious distress.  Obese built HENT: Pallor noted.  Pupils equally reactive to light. Oral mucosa is moist NECK: is supple, no gross swelling noted. CHEST: Clear to auscultation. No crackles or wheezes.   CVS: S1 and S2 heard, no murmur. Regular rate and rhythm.  ABDOMEN: Soft, non-tender, bowel sounds are present. EXTREMITIES: Bilateral above-knee amputation with stump, staples in place CNS: Cranial nerves are intact. SKIN: warm and dry, bilateral lower knee amputation with staples in place.  Data Review: I have personally reviewed the following laboratory data and studies,  CBC: Recent Labs  Lab 09/07/23 1936 09/08/23 0500 09/08/23 1317 09/09/23 0709 09/09/23 2033 09/10/23 0810  WBC 27.9* 23.8*  --  20.3*  --  21.9*  NEUTROABS 25.9*  --   --   --   --   --   HGB 8.0* 6.9* 8.2* 6.7* 8.1* 7.9*  HCT 26.4* 23.2* 24.0* 21.2* 25.5* 25.4*  MCV 81.0 82.6  --  84.1  --  87.0  PLT 550* 448*  --  344  --  370   Basic Metabolic Panel: Recent Labs  Lab 09/07/23 1936 09/08/23 0500 09/08/23 1317 09/08/23 2113  09/09/23 0706 09/09/23 0709 09/10/23 0810  NA 139 138 138 136  --  133* 132*  K 2.5* 2.9* 3.5 3.7  --  3.3* 3.8  CL 96* 97*  --  98  --  95* 94*  CO2 30 31  --  26  --  28 23  GLUCOSE 125* 102*  --  136*  --  135* 110*  BUN 16 19  --  25*  --  28* 36*  CREATININE 3.52* 4.16*  --  4.91*  --  5.49* 6.67*  CALCIUM 7.6* 7.4*  --  7.6*  --  6.8* 6.5*  MG  --   --   --   --   --  1.7 1.8  PHOS  --   --   --   --  4.8*  --   --    Liver Function Tests: Recent Labs  Lab 09/07/23 1936 09/08/23 0500  AST 79* 66*  ALT 49* 40  ALKPHOS 71 54  BILITOT 0.9 1.0  PROT 7.2 6.2*  ALBUMIN 1.7* 1.8*   No results for input(s): "LIPASE", "AMYLASE" in the last 168 hours. No results for input(s): "AMMONIA" in the last 168 hours. Cardiac Enzymes: No results for input(s): "CKTOTAL", "CKMB", "CKMBINDEX", "TROPONINI" in the last 168 hours. BNP (last 3 results) Recent Labs    02/05/23 1408 09/07/23 1936  BNP 553.7* 484.4*    ProBNP (last 3 results) No results for input(s): "PROBNP" in the last 8760 hours.  CBG: Recent Labs  Lab 09/09/23 1116 09/09/23 2017 09/09/23 2353 09/10/23 0354 09/10/23 0819  GLUCAP 174* 147* 154* 123* 119*   Recent Results (from the past 240 hours)  Culture, blood (Routine x 2)     Status: None (Preliminary result)   Collection Time: 09/07/23  3:52 PM   Specimen: BLOOD  Result Value Ref Range Status   Specimen Description BLOOD SITE NOT SPECIFIED  Final   Special Requests   Final    BOTTLES DRAWN AEROBIC AND ANAEROBIC Blood Culture results may not be optimal due to an inadequate volume of blood received in culture bottles   Culture   Final    NO GROWTH 3 DAYS Performed  at [facility], [address] [place], [place] [address]    Report Status PENDING  Incomplete  Culture, blood (Routine x 2)     Status: None (Preliminary result)   Collection Time: 09/07/23  7:36 PM   Specimen: BLOOD  Result Value Ref Range Status   Specimen Description BLOOD SITE  NOT SPECIFIED  Final   Special Requests   Final    BOTTLES DRAWN AEROBIC AND ANAEROBIC Blood Culture results may not be optimal due to an inadequate volume of blood received in culture bottles   Culture   Final    NO GROWTH 3 DAYS Performed at [facility], [address] [place], [place] [address]    Report Status PENDING  Incomplete  Resp panel by RT-PCR (RSV, Flu A&B, Covid) Peripheral     Status: None   Collection Time: 09/07/23  7:36 PM   Specimen: Peripheral; Nasal Swab  Result Value Ref Range Status   SARS Coronavirus 2 by RT PCR NEGATIVE NEGATIVE Final   Influenza A by PCR NEGATIVE NEGATIVE Final   Influenza B by PCR NEGATIVE NEGATIVE Final    Comment: (NOTE) The Xpert Xpress SARS-CoV-2/FLU/RSV plus assay is intended as an aid in the diagnosis of influenza from Nasopharyngeal swab specimens and should not be used as a sole basis for treatment. Nasal washings and aspirates are unacceptable for Xpert Xpress SARS-CoV-2/FLU/RSV testing.  Fact Sheet for Patients: BloggerCourse.com  Fact Sheet for Healthcare Providers: SeriousBroker.it  This test is not yet approved or cleared by the [place] FDA and has been authorized for detection and/or diagnosis of SARS-CoV-2 by FDA under an Emergency Use Authorization (EUA). This EUA will remain in effect (meaning this test can be used) for the duration of the COVID-19 declaration under Section 564(b)(1) of the Act, 21 U.S.C. section 360bbb-3(b)(1), unless the authorization is terminated or revoked.     Resp Syncytial Virus by PCR NEGATIVE NEGATIVE Final    Comment: (NOTE) Fact Sheet for Patients: BloggerCourse.com  Fact Sheet for Healthcare Providers: SeriousBroker.it  This test is not yet approved or cleared by the [place] FDA and has been authorized for detection and/or diagnosis of SARS-CoV-2 by FDA under an  Emergency Use Authorization (EUA). This EUA will remain in effect (meaning this test can be used) for the duration of the COVID-19 declaration under Section 564(b)(1) of the Act, 21 U.S.C. section 360bbb-3(b)(1), unless the authorization is terminated or revoked.  Performed at [facility], [address], [place], [place] [address]      Studies: No results found.     Joycelyn Das, MD  [facility] 09/10/2023  If 7PM-7AM, please contact night-coverage

## 2023-09-10 NOTE — Progress Notes (Signed)
 [facility] Progress Note  Subjective:  Seen in room, no sob or swelling  Vitals:   09/09/23 2240 09/09/23 2328 09/10/23 0338 09/10/23 0824  BP:  (!) 117/49 (!) 103/46 (!) 108/40  Pulse: 61 60 65 (!) 53  Resp: (!) 33 20 18 18   Temp:  98.3 F (36.8 C) 98.2 F (36.8 C) 97.6 F (36.4 C)  TempSrc:  Oral Oral Axillary  SpO2: 99% 100% 94% 99%  Weight:      Height:        Exam: Gen alert, no distress Sclera anicteric, throat clear  No jvd or bruits Chest clear bilat to bases  RRR no MRG Abd soft ntnd no mass or ascites +bs MS bilat AKA w/ wrapped stumps Ext no LE edema Neuro is alert, Ox 3 , nf    RUA AVF +bruit, +bruising       Renal-related home meds: Midodrine to 5 mg daily  Torsemide 40 mg twice daily Others: Statin, Singulair, Lantus insulin, Breo elliptica, dulaglutide, Eliquis, amiodarone      OP HD: [facility] MWF (pt says it is M-F)  3h  B400   101kg  2K bath   RUA AVF  Heparin none - hectorol 1 mcg  - mircera 225 mcg q 2 wks, last 3/07       Assessment/ Plan: Gangrene right lower leg: Progressive issue, also nonhealing left BKA site.  Patient underwent bilateral AKA on 09/08/2023.  Per [facility]. ESRD: on HD M-F.  Next dialysis Friday.  Patient states he only does dialysis 2 times a week.  Chronic hypotension: On midodrine at home, continue here Volume: Patient is up 2 kg by with weight, and his dry weight should be 3 or 4 kg down given bilateral amputations.  Does not look volume overloaded at all on exam.  UF 3 kg with next dialysis Anemia of esrd:.  Hemoglobin 6.7-8.5 here, has received PRBCs here. Secondary hyperparathyroidism: Corrected calcium is in range. Will add on phos and continue any binders with meals. PAF Chronic systolic HF           Rob Romulo Okray MD  [facility] 09/10/2023, 11:42 AM  Recent Labs  Lab 09/07/23 1936 09/08/23 0500 09/08/23 1317 09/09/23 0706 09/09/23 0709 09/09/23 2033 09/10/23 0810  HGB 8.0* 6.9*   < >  --   6.7* 8.1* 7.9*  ALBUMIN 1.7* 1.8*  --   --   --   --   --   CALCIUM 7.6* 7.4*   < >  --  6.8*  --  6.5*  PHOS  --   --   --  4.8*  --   --   --   CREATININE 3.52* 4.16*   < >  --  5.49*  --  6.67*  K 2.5* 2.9*   < >  --  3.3*  --  3.8   < > = values in this interval not displayed.   Recent Labs  Lab 09/08/23 0617  IRON 11*  TIBC NOT CALCULATED  FERRITIN 669*   Inpatient medications:  atorvastatin  80 mg Oral Daily   [START ON 09/11/2023] doxercalciferol  1 mcg Intravenous Once per day on Monday Friday   insulin aspart  0-9 Units Subcutaneous Q4H    ceFEPime (MAXIPIME) IV Stopped (09/09/23 1733)   linezolid (ZYVOX) IV 600 mg (09/10/23 0931)   acetaminophen **OR** acetaminophen, albuterol, HYDROcodone-acetaminophen, HYDROmorphone (DILAUDID) injection, ondansetron **OR** ondansetron (ZOFRAN) IV

## 2023-09-11 DIAGNOSIS — I96 Gangrene, not elsewhere classified: Secondary | ICD-10-CM | POA: Diagnosis not present

## 2023-09-11 LAB — GLUCOSE, CAPILLARY
Glucose-Capillary: 162 mg/dL — ABNORMAL HIGH (ref 70–99)
Glucose-Capillary: 139 mg/dL — ABNORMAL HIGH (ref 70–99)
Glucose-Capillary: 105 mg/dL — ABNORMAL HIGH (ref 70–99)
Glucose-Capillary: 117 mg/dL — ABNORMAL HIGH (ref 70–99)

## 2023-09-11 LAB — CBC
HCT: 23.3 % — ABNORMAL LOW (ref 39.0–52.0)
Hemoglobin: 7.3 g/dL — ABNORMAL LOW (ref 13.0–17.0)
MCH: 26.9 pg (ref 26.0–34.0)
MCHC: 31.3 g/dL (ref 30.0–36.0)
MCV: 86 fL (ref 80.0–100.0)
Platelets: 371 10*3/uL (ref 150–400)
RBC: 2.71 MIL/uL — ABNORMAL LOW (ref 4.22–5.81)
RDW: 18.4 % — ABNORMAL HIGH (ref 11.5–15.5)
WBC: 21.2 10*3/uL — ABNORMAL HIGH (ref 4.0–10.5)
nRBC: 0 % (ref 0.0–0.2)

## 2023-09-11 LAB — HEPATITIS B SURFACE ANTIBODY, QUANTITATIVE: Hep B S AB Quant (Post): 22.1 m[IU]/mL

## 2023-09-11 MED ORDER — AMIODARONE HCL 200 MG PO TABS
200.0000 mg | ORAL_TABLET | Freq: Two times a day (BID) | ORAL | Status: DC
  Administered 2023-09-11 – 2023-09-20 (×19): 200 mg via ORAL
  Filled 2023-09-11 (×19): qty 1

## 2023-09-11 MED ORDER — MONTELUKAST SODIUM 10 MG PO TABS
10.0000 mg | ORAL_TABLET | Freq: Every day | ORAL | Status: DC
  Administered 2023-09-11 – 2023-09-20 (×9): 10 mg via ORAL
  Filled 2023-09-11 (×9): qty 1

## 2023-09-11 MED ORDER — RENA-VITE PO TABS
1.0000 | ORAL_TABLET | Freq: Every day | ORAL | Status: DC
  Administered 2023-09-11 – 2023-09-20 (×9): 1 via ORAL
  Filled 2023-09-11 (×9): qty 1

## 2023-09-11 MED ORDER — APIXABAN 5 MG PO TABS
5.0000 mg | ORAL_TABLET | Freq: Two times a day (BID) | ORAL | Status: DC
Start: 2023-09-11 — End: 2023-09-20
  Administered 2023-09-11 – 2023-09-20 (×19): 5 mg via ORAL
  Filled 2023-09-11 (×19): qty 1

## 2023-09-11 MED ORDER — FLUTICASONE FUROATE-VILANTEROL 200-25 MCG/ACT IN AEPB
2.0000 | INHALATION_SPRAY | Freq: Every day | RESPIRATORY_TRACT | Status: DC
  Administered 2023-09-12 – 2023-09-20 (×8): 2 via RESPIRATORY_TRACT
  Filled 2023-09-11: qty 28

## 2023-09-11 MED ORDER — MIDODRINE HCL 5 MG PO TABS
5.0000 mg | ORAL_TABLET | Freq: Every day | ORAL | Status: DC
  Administered 2023-09-11 – 2023-09-20 (×10): 5 mg via ORAL
  Filled 2023-09-11 (×10): qty 1

## 2023-09-11 NOTE — Progress Notes (Signed)
 Physical Therapy Evaluation Patient Details Name: [name] [name] MRN: [id number] DOB: [birth year] Today's Date: 09/11/2023  History of Present Illness  68 y.o. male presents to [facility] 09/07/23 w/ non healing ulcer on R heel and poor healing of L BKA. S/p L and R AKA on 3/18. Chest x-ray also showed mass like opacity on suprahilar region on L. PMHx: AKI with ESRD and HD, CHF, CAD s/p CABG x3,  DM, diabetic neuropathy, OSA, L BKA 01/26/23   Clinical Impression  Pt in bed upon arrival and agreeable to PT eval. PTA, pt would laterally scoot to a WC for mobility with no AD. In today's session, pt required MaxAx2 to move from supine/sit with use of the bed pad. Pt had decreased static and dynamic balance in sitting and would occasionally need ModA to correct. Pt was able to scoot posteriorly with MinAx2 and use of the bed pad. Pt had increased difficulty shifting hips once in sitting. Pt currently with functional limitations due to the deficits listed below (see PT Problem List). Pt would benefit from acute skilled PT to address functional impairments. Recommending post-acute rehab <3hrs to work towards independence with mobility. Acute PT to follow.         If plan is discharge home, recommend the following: A lot of help with walking and/or transfers;A lot of help with bathing/dressing/bathroom;Assistance with cooking/housework;Assist for transportation;Help with stairs or ramp for entrance   Can travel by private vehicle   No    Equipment Recommendations None recommended by PT (Pt owns equipment)     Functional Status Assessment Patient has had a recent decline in their functional status and demonstrates the ability to make significant improvements in function in a reasonable and predictable amount of time.     Precautions / Restrictions Precautions Precautions: Fall Restrictions Weight Bearing Restrictions Per Provider Order: Yes RLE Weight Bearing Per Provider Order: Non weight  bearing LLE Weight Bearing Per Provider Order: Non weight bearing      Mobility  Bed Mobility Overal bed mobility: Needs Assistance Bed Mobility: Rolling, Supine to Sit, Sit to Supine Rolling: Mod assist, +2 for physical assistance   Supine to sit: Max assist, +2 for physical assistance Sit to supine: Min assist   General bed mobility comments: ModAx2 to roll bilaterally with use of bed pad and pt grabbing rail with B UE. Pt able to move to sitting with MaxAx2 using helicopter method and bed pad. Pt was able to move into long sitting with CGA and hands on rails.    Transfers    General transfer comment: required MinAx2 with use of bed pad to scoot posteriorly, unable to shift hips once in sitting without assistance       Balance Overall balance assessment: Needs assistance Sitting-balance support: Bilateral upper extremity supported Sitting balance-Leahy Scale: Fair Sitting balance - Comments: ranged from needing ModA to CGA once in sitting. Pt would occasionally lose balance when reaching for target requiring ModA to correct          Pertinent Vitals/Pain Pain Assessment Pain Assessment: Faces Faces Pain Scale: Hurts even more Pain Location: B residual limbs Pain Descriptors / Indicators: Sharp Pain Intervention(s): Limited activity within patient's tolerance, Monitored during session, Repositioned    Home Living Family/patient expects to be discharged to:: Private residence Living Arrangements: Spouse/significant other;Children Available Help at Discharge: Family Type of Home: House Home Access: Ramped entrance       Home Layout: One level Home Equipment: Administrator (4 wheels);Wheelchair - manual;Grab  bars - toilet;Rolling Walker (2 wheels);Electric scooter Additional Comments: bari drop arm commode, sleeps in recliner    Prior Function Prior Level of Function : Needs assist    Mobility Comments: performed lateral scoots to the WC with no AD  or slide board. Independent ADLs Comments: takes bus to dialysis; uses lift at dialysis, spouse sets him up at recliner level for bathing and dressing.  Assist as needed for ADL, meal prep.     Extremity/Trunk Assessment   Upper Extremity Assessment Upper Extremity Assessment: Defer to OT evaluation    Lower Extremity Assessment Lower Extremity Assessment: RLE deficits/detail;LLE deficits/detail RLE Deficits / Details: able to minimally raise leg against gravity and perform hip ABD, alert to light touch LLE Deficits / Details: unable to lift leg against gravity, able to perform slight hip ABD, alert to light touch. Gauze on residual limb    Cervical / Trunk Assessment Cervical / Trunk Assessment: Normal  Communication   Communication Communication: No apparent difficulties    Cognition Arousal: Alert Behavior During Therapy: Flat affect   PT - Cognitive impairments: No apparent impairments    Following commands: Intact        PT Assessment Patient needs continued PT services  PT Problem List Decreased strength;Decreased activity tolerance;Decreased balance;Decreased mobility;Decreased knowledge of use of DME       PT Treatment Interventions DME instruction;Functional mobility training;Therapeutic activities;Therapeutic exercise;Balance training;Neuromuscular re-education;Patient/family education;Wheelchair mobility training    PT Goals (Current goals can be found in the Care Plan section)  Acute Rehab PT Goals Patient Stated Goal: to get better PT Goal Formulation: With patient Time For Goal Achievement: 09/25/23 Potential to Achieve Goals: Good    Frequency Min 2X/week     Co-evaluation   Reason for Co-Treatment: For patient/therapist safety;To address functional/ADL transfers PT goals addressed during session: Mobility/safety with mobility;Balance;Proper use of DME         AM-PAC PT "6 Clicks" Mobility  Outcome Measure Help needed turning from your back  to your side while in a flat bed without using bedrails?: Total Help needed moving from lying on your back to sitting on the side of a flat bed without using bedrails?: Total Help needed moving to and from a bed to a chair (including a wheelchair)?: Total Help needed standing up from a chair using your arms (e.g., wheelchair or bedside chair)?: Total Help needed to walk in hospital room?: Total Help needed climbing 3-5 steps with a railing? : Total 6 Click Score: 6    End of Session   Activity Tolerance: Patient tolerated treatment well Patient left: in bed;with call bell/phone within reach Nurse Communication: Mobility status PT Visit Diagnosis: Other abnormalities of gait and mobility (R26.89);Muscle weakness (generalized) (M62.81)    Time: 1242-1300 PT Time Calculation (min) (ACUTE ONLY): 18 min   Charges:   PT Evaluation $PT Eval Low Complexity: 1 Low   PT General Charges $$ ACUTE PT VISIT: 1 Visit        Hilton Cork, PT, DPT Secure Chat Preferred  Rehab Office [phone]   Arturo Morton Brion Aliment 09/11/2023, 2:55 PM

## 2023-09-11 NOTE — Evaluation (Signed)
 Occupational Therapy Evaluation Patient Details Name: [name] [name] MRN: [id number] DOB: [birth year] Today's Date: 09/11/2023   History of Present Illness   68 y.o. male presents to [facility] 09/07/23 w/ non healing ulcer on R heel and poor healing of L BKA. S/p L and R AKA on 3/18. Chest x-ray also showed mass like opacity on suprahilar region on L. PMHx: AKI with ESRD and HD, CHF, CAD s/p CABG x3,  DM, diabetic neuropathy, OSA, L BKA 01/26/23     Clinical Impressions Patient admitted for the diagnosis above.  PTA, despite R foot infection, patient stating he continued to perform transfers on his own, and stated he was continuing to drive.  Pain to B lower extremities is the primary deficit, but upper body weakness is creating bedlevel mobility issues.  OT will continue efforts in the acute setting and Patient will benefit from continued inpatient follow up therapy, <3 hours/day.  Patient may need the hoyer lift at home again.       If plan is discharge home, recommend the following:   Assist for transportation;A lot of help with bathing/dressing/bathroom;Assistance with cooking/housework;Help with stairs or ramp for entrance     Functional Status Assessment   Patient has had a recent decline in their functional status and demonstrates the ability to make significant improvements in function in a reasonable and predictable amount of time.     Equipment Recommendations   None recommended by OT     Recommendations for Other Services         Precautions/Restrictions   Precautions Precautions: Fall Restrictions Weight Bearing Restrictions Per Provider Order: Yes RLE Weight Bearing Per Provider Order: Non weight bearing LLE Weight Bearing Per Provider Order: Non weight bearing     Mobility Bed Mobility Overal bed mobility: Needs Assistance Bed Mobility: Rolling, Supine to Sit, Sit to Supine Rolling: Mod assist, +2 for physical assistance   Supine to sit: Max assist,  +2 for physical assistance Sit to supine: Min assist        Transfers                   General transfer comment: unable to scoot      Balance Overall balance assessment: Needs assistance Sitting-balance support: Bilateral upper extremity supported Sitting balance-Leahy Scale: Fair         Standing balance comment: n/a                           ADL either performed or assessed with clinical judgement   ADL Overall ADL's : Needs assistance/impaired Eating/Feeding: Set up;Bed level   Grooming: Wash/dry hands;Wash/dry face;Contact guard assist;Sitting   Upper Body Bathing: Set up;Bed level   Lower Body Bathing: Moderate assistance;Bed level   Upper Body Dressing : Set up;Bed level   Lower Body Dressing: Moderate assistance;Bed level Lower Body Dressing Details (indicate cue type and reason): pain limiting ability to pick up LE's from the bed     Toileting- Clothing Manipulation and Hygiene: Maximal assistance;Bed level               Vision Patient Visual Report: No change from baseline       Perception Perception: Not tested       Praxis Praxis: Not tested       Pertinent Vitals/Pain Pain Assessment Pain Assessment: Faces Faces Pain Scale: Hurts even more Pain Location: B residual limbs Pain Descriptors / Indicators: Sharp Pain Intervention(s): Monitored during session  Extremity/Trunk Assessment Upper Extremity Assessment Upper Extremity Assessment: Generalized weakness;Left hand dominant   Lower Extremity Assessment Lower Extremity Assessment: Defer to PT evaluation   Cervical / Trunk Assessment Cervical / Trunk Assessment: Normal   Communication Communication Communication: No apparent difficulties   Cognition Arousal: Alert Behavior During Therapy: Flat affect Cognition: No apparent impairments                               Following commands: Intact       Cueing  General Comments        VSS on RA   Exercises     Shoulder Instructions      Home Living Family/patient expects to be discharged to:: Private residence Living Arrangements: Spouse/significant other;Children Available Help at Discharge: Family Type of Home: House Home Access: Ramped entrance     Home Layout: One level     Bathroom Shower/Tub: Walk-in shower;Sponge bathes at baseline   [employer]: Handicapped height Bathroom Accessibility: Yes How Accessible: Accessible via walker Home Equipment: Administrator (4 wheels);Wheelchair - manual;Grab bars - Curator (2 wheels);Electric scooter   Additional Comments: bari drop arm commode, sleeps in recliner      Prior Functioning/Environment Prior Level of Function : Needs assist               ADLs Comments: takes bus to dialysis; uses lift at dialysis, spouse sets him up at recliner level for bathing and dressing.  Assist as needed for ADL, meal prep.    OT Problem List: Decreased strength;Decreased range of motion;Decreased activity tolerance;Impaired balance (sitting and/or standing);Obesity;Pain   OT Treatment/Interventions: Self-care/ADL training;Therapeutic activities;Therapeutic exercise;Energy conservation;DME and/or AE instruction;Patient/family education;Balance training      OT Goals(Current goals can be found in the care plan section)   Acute Rehab OT Goals Patient Stated Goal: Return home OT Goal Formulation: With patient Time For Goal Achievement: 09/25/23 Potential to Achieve Goals: Fair ADL Goals Additional ADL Goal #1: Sit EOB with setup for grooming task and demo good balance Additional ADL Goal #2: Supervision for EOB ADL and demo good balance. Additional ADL Goal #3: Min A of 2 for a/p transfer to recliner   OT Frequency:  Min 1X/week    Co-evaluation              AM-PAC OT "6 Clicks" Daily Activity     Outcome Measure Help from another person eating meals?: None Help from  another person taking care of personal grooming?: A Little Help from another person toileting, which includes using toliet, bedpan, or urinal?: A Lot Help from another person bathing (including washing, rinsing, drying)?: A Lot Help from another person to put on and taking off regular upper body clothing?: A Little Help from another person to put on and taking off regular lower body clothing?: A Lot 6 Click Score: 16   End of Session Nurse Communication: Mobility status  Activity Tolerance: Patient tolerated treatment well Patient left: in bed;with call bell/phone within reach  OT Visit Diagnosis: Muscle weakness (generalized) (M62.81);Pain Pain - Right/Left: Left Pain - part of body: Leg                Time: 1240-1300 OT Time Calculation (min): 20 min Charges:  OT General Charges $OT Visit: 1 Visit OT Evaluation $OT Eval Moderate Complexity: 1 Mod  09/11/2023  RP, OTR/L  Acute Rehabilitation Services  Office:  [phone]   Suzanna Obey 09/11/2023, 1:29  PM

## 2023-09-11 NOTE — Progress Notes (Signed)
 Received patient in bed to unit.  Alert and oriented.  Informed consent signed and in chart.   TX duration:  Patient tolerated well.  Transported back to the room  Alert, without acute distress.  Hand-off given to patient's nurse.   Access used: Right Upper Arm AVF Access issues: None  Total UF removed: 3 Medication(s) given: See [facility]   09/11/23 1719  Vitals  Temp 98.9 F (37.2 C)  Pulse Rate 75  Resp (!) 22  BP (!) 105/37  Oxygen Therapy  Patient Activity (if Appropriate) In bed  Pulse Oximetry Type Continuous  During Treatment Monitoring  Intra-Hemodialysis Comments Tx completed

## 2023-09-11 NOTE — Progress Notes (Signed)
 [facility] Progress Note  Subjective:  Seen in room No c/o's Post-op pain is starting to ease off  Vitals:   09/11/23 1430 09/11/23 1500 09/11/23 1530 09/11/23 1600  BP: (!) 99/43 (!) 95/44 (!) 113/42 (!) 101/40  Pulse: 61 64 67 69  Resp: (!) 21 19 13 19   Temp:      TempSrc:      SpO2: 98% 97% 98% 98%  Weight:      Height:        Exam: Gen alert, no distress Sclera anicteric, throat clear  No jvd or bruits Chest clear bilat to bases  RRR no MRG Abd soft ntnd no mass or ascites +bs MS bilat AKA w/ wrapped stumps Ext no LE edema Neuro is alert, Ox 3 , nf    RUA AVF +bruit, +bruising       Renal-related home meds: Midodrine to 5 mg daily  Torsemide 40 mg twice daily Others: Statin, Singulair, Lantus insulin, Breo elliptica, dulaglutide, Eliquis, amiodarone      OP HD: [facility] MWF (pt says it is M-F)  3h  B400   101kg  2K bath   RUA AVF  Heparin none - hectorol 1 mcg  - mircera 225 mcg q 2 wks, last 3/07       Assessment/ Plan: Gangrene right lower leg: Progressive issue, also nonhealing left BKA site.  Patient underwent bilateral AKA on 09/08/2023.  Per [facility]. ESRD: on HD M-F.  Next HD today in progress.  Patient states he only does dialysis 2 times a week.  Chronic hypotension: On midodrine at home, continue here Volume: 3.5 kg down by wts, which should be the case after sig amputation. Soft BP's, UF as tol w/ HD today.  Anemia of esrd:.  Hemoglobin 6.7-8.5 here, has received PRBCs here. Secondary hyperparathyroidism: Corrected calcium and phos are in range. Continue any binders with meals. PAF Chronic systolic HF           Rob Ravi Tuccillo MD  [facility] 09/11/2023, 5:09 PM  Recent Labs  Lab 09/07/23 1936 09/08/23 0500 09/08/23 1203 09/09/23 0706 09/09/23 0709 09/09/23 2033 09/10/23 0810 09/11/23 0834  HGB 8.0* 6.9*   < >  --  6.7*   < > 7.9* 7.3*  ALBUMIN 1.7* 1.8*  --   --   --   --   --   --   CALCIUM 7.6* 7.4*   < >  --  6.8*  --   6.5*  --   PHOS  --   --   --  4.8*  --   --   --   --   CREATININE 3.52* 4.16*   < >  --  5.49*  --  6.67*  --   K 2.5* 2.9*   < >  --  3.3*  --  3.8  --    < > = values in this interval not displayed.   Recent Labs  Lab 09/08/23 0617  IRON 11*  TIBC NOT CALCULATED  FERRITIN 669*   Inpatient medications:  amiodarone  200 mg Oral BID   apixaban  5 mg Oral BID   atorvastatin  80 mg Oral Daily   Chlorhexidine Gluconate Cloth  6 each Topical Q0600   doxercalciferol  1 mcg Intravenous Once per day on Monday Friday   fluticasone furoate-vilanterol  2 puff Inhalation Daily   insulin aspart  0-9 Units Subcutaneous TID WC   midodrine  5 mg Oral Daily   montelukast  10 mg Oral Daily   multivitamin  1 tablet Oral Daily     acetaminophen **OR** acetaminophen, albuterol, HYDROcodone-acetaminophen, HYDROmorphone (DILAUDID) injection, ondansetron **OR** ondansetron (ZOFRAN) IV

## 2023-09-11 NOTE — Care Management Important Message (Signed)
 Important Message  Patient Details  Name: [name] [name] MRN: [id number] Date of Birth: 06/03/1956   Important Message Given:  Yes - Medicare IM     Renie Ora 09/11/2023, 10:25 AM

## 2023-09-11 NOTE — Progress Notes (Addendum)
  Progress Note    09/11/2023 8:12 AM 3 Days Post-Op  Subjective: Pain at his amputation sites is mostly unchanged, but controlled    Vitals:   09/11/23 0441 09/11/23 0742  BP: (!) 113/41 (!) 101/44  Pulse: 63 (!) 57  Resp: 17 20  Temp: 97.7 F (36.5 C) 97.7 F (36.5 C)  SpO2: 99% 99%    Physical Exam: General: Sitting up in bed, NAD Lungs: Nonlabored Incisions: Bilateral AKAs dressed and dry  CBC    Component Value Date/Time   WBC 21.9 (H) 09/10/2023 0810   RBC 2.92 (L) 09/10/2023 0810   HGB 7.9 (L) 09/10/2023 0810   HGB 7.6 (L) 04/17/2022 1452   HCT 25.4 (L) 09/10/2023 0810   HCT 25.3 (L) 04/17/2022 1452   PLT 370 09/10/2023 0810   PLT 351 04/17/2022 1452   MCV 87.0 09/10/2023 0810   MCV 88 04/17/2022 1452   MCH 27.1 09/10/2023 0810   MCHC 31.1 09/10/2023 0810   RDW 17.8 (H) 09/10/2023 0810   RDW 15.5 (H) 04/17/2022 1452   LYMPHSABS 0.0 (L) 09/07/2023 1936   LYMPHSABS 1.4 02/18/2022 1117   MONOABS 1.7 (H) 09/07/2023 1936   EOSABS 0.0 09/07/2023 1936   EOSABS 0.3 02/18/2022 1117   BASOSABS 0.3 (H) 09/07/2023 1936   BASOSABS 0.1 02/18/2022 1117    BMET    Component Value Date/Time   NA 132 (L) 09/10/2023 0810   NA 139 04/17/2022 1452   K 3.8 09/10/2023 0810   CL 94 (L) 09/10/2023 0810   CO2 23 09/10/2023 0810   GLUCOSE 110 (H) 09/10/2023 0810   BUN 36 (H) 09/10/2023 0810   BUN 42 (H) 04/17/2022 1452   CREATININE 6.67 (H) 09/10/2023 0810   CALCIUM 6.5 (L) 09/10/2023 0810   GFRNONAA 8 (L) 09/10/2023 0810   GFRAA 42 (L) 10/18/2019 1006    INR    Component Value Date/Time   INR 3.3 (H) 09/08/2023 0500     Intake/Output Summary (Last 24 hours) at 09/11/2023 1610 Last data filed at 09/10/2023 2000 Gross per 24 hour  Intake 1120 ml  Output 100 ml  Net 1020 ml      Assessment/Plan:  68 y.o. male is 3 days postop, s/p: Bilateral AKAs   -The patient says his pain at his amputation sites is about the same but this is controlled with pain  medications -He continues to have some mild bloody oozing from his amputation sites. RN just changed his dry dressings.  Will continue as needed for bleeding -Okay to work with PT/OT   Loel Dubonnet, PA-C [facility] [phone] 09/11/2023 8:12 AM   I have independently interviewed and examined patient and agree with PA assessment and plan above.   Benjerman Molinelli C. Randie Heinz, MD [facility] [place] Office: [phone] Pager: [phone]

## 2023-09-11 NOTE — Progress Notes (Signed)
 PROGRESS NOTE  [name]:[id number] DOB: 1956/03/26 DOA: 09/07/2023 PCP: [name], PA-C   LOS: 4 days   Brief narrative:   [name] [name] is a 68 y.o. male with medical history significant of end-stage renal disease on hemodialysis Monday  Friday, anemia, hypertension, history of coronary artery disease status post CABG, type 2 diabetes, congestive heart failure with ejection fraction of 30%, obstructive sleep apnea on CPAP, paroxysmal atrial fibrillation, history of severe peripheral vascular disease with critical limb ischemia status post atherectomy and stenting of left SFA and popliteal artery on 12/29/2022 by Dr. Randie Heinz subsequently followed by left BKA on 01/26/2023.  Also had right heel ulcer leading to right common femoral to below-knee popliteal artery bypass on  03/19/2023. Since then patient has had difficulty with healing of both his left BKA wound as well as non-healing ulcer on right heel with foul-smelling discharge from the right foot.  Patient then presented to hospital.  In the ED patient was seen by vascular surgery and was noted to have severe necrotic changes to the right lower extremities and nonhealing below-knee amputation on the left.  Patient was also febrile and slightly hypotensive and lactic acid was elevated at 2.5.  Chest x-ray showed masslike opacity in the suprahilar region of the left.  Right foot x-ray showed diffuse soft tissue edema and extensive subcu gas with destructive changes of the first metatarsal joint likely osteomyelitis and septic arthritis.  Patient was given cefepime and Zyvox and was considered for admission to the hospital for further evaluation and treatment.   Assessment/Plan: Principal Problem:   Gangrene of lower extremity (HCC) Active Problems:   Mixed hyperlipidemia   Paroxysmal atrial fibrillation (HCC)   Chronic diastolic CHF (congestive heart failure) (HCC)   S/P CABG x 3   End stage renal disease (HCC)      Nonhealing right lower extremity ulcer with progressive gangrene  Patient with history of severe PVD with multiple intervention in the past.    MRI of the right ankle and foot with osteomyelitis and soft tissue ulceration.  Patient has undergone right above-knee amputation on 09/08/2023 by vascular surgery.  Received 48 hours of antibiotic postop.  WBC still at 21K from 21K yesterday.  No fever.  Will discontinue antibiotic and observe off without.  Patient seems to be clinically improving.  Blood cultures negative in 4 days.  Nonhealing left below-knee amputation site  Patient underwent left above-knee amputation on 09/08/2023.  Will need to follow-up with vascular surgery as outpatient.  [facility] consulted for hemodialysis.   Patient states that he goes to dialysis twice a week.  Will resume midodrine from home.    Mild hypokalemia.  Improved.   Acute blood loss anemia on anemia of chronic kidney disease.  Hemoglobin today at 7.3 from 7.9 yesterday.  Received 1 unit of packed RBC during hospitalization.  Stable.  Will resume Eliquis.  History of hypotension    Blood pressure is on the lower margin at this time.  Will resume midodrine from home.    CAD s/p CABG No active issues at this time.  Continue Lipitor   Diabetes mellitus type 2. Continue sliding scale insulin, on regular diet at this time.  Glycemic control is adequate.  Patient is on Trulicity and Lantus 20 units at home.  Chronic systolic heart failure. Continue hemodialysis for fluid management.  Appears to be compensated at this time.   OSA Continue CPAP at bedtime.  Continue inhalers.   Paroxysmal atrial  fibrillation. Resume albuterol.  Has been started on Eliquis.   COPD -continue on breo and bronchodilators.  Appears compensated.  Class I obesity.  Body mass index is 32.71 kg/m. Would benefit from ongoing weight loss as outpatient.  DVT prophylaxis: Will resume Eliquis. apixaban (ELIQUIS) tablet 5 mg    Disposition: Uncertain at this time.  PT OT evaluation pending.  Status is: Inpatient  Remains inpatient appropriate because: Status post bilateral above-knee amputation, PT OT pending.    Code Status:     Code Status: Full Code  Family Communication: None at bedside  Consultants: Vascular surgery Nephrology  Procedures: Bilateral above-knee amputation on 09/08/2023  Anti-infectives:  Will discontinue antibiotic  Anti-infectives (From admission, onward)    Start     Dose/Rate Route Frequency Ordered Stop   09/08/23 1800  ceFEPIme (MAXIPIME) 2 g in sodium chloride 0.9 % 100 mL IVPB  Status:  Discontinued        2 g 200 mL/hr over 30 Minutes Intravenous Every 24 hours 09/07/23 2236 09/08/23 0906   09/08/23 1800  ceFEPIme (MAXIPIME) 1 g in sodium chloride 0.9 % 100 mL IVPB  Status:  Discontinued        1 g 200 mL/hr over 30 Minutes Intravenous Every 24 hours 09/08/23 0906 09/11/23 1001   09/08/23 1000  linezolid (ZYVOX) IVPB 600 mg  Status:  Discontinued        600 mg 300 mL/hr over 60 Minutes Intravenous Every 12 hours 09/08/23 0658 09/11/23 1001   09/08/23 0946  ceFAZolin (ANCEF) 2-4 GM/100ML-% IVPB       Note to Pharmacy: Shanda Bumps M: cabinet override      09/08/23 0946 09/08/23 2159   09/07/23 1930  linezolid (ZYVOX) IVPB 600 mg        600 mg 300 mL/hr over 60 Minutes Intravenous  Once 09/07/23 1923 09/07/23 2255   09/07/23 1815  clindamycin (CLEOCIN) IVPB 600 mg  Status:  Discontinued        600 mg 100 mL/hr over 30 Minutes Intravenous  Once 09/07/23 1808 09/07/23 1923   09/07/23 1745  ceFEPIme (MAXIPIME) 2 g in sodium chloride 0.9 % 100 mL IVPB        2 g 200 mL/hr over 30 Minutes Intravenous  Once 09/07/23 1734 09/07/23 2031   09/07/23 1745  vancomycin (VANCOCIN) IVPB 1000 mg/200 mL premix  Status:  Discontinued        1,000 mg 200 mL/hr over 60 Minutes Intravenous  Once 09/07/23 1734 09/07/23 1741   09/07/23 1745  vancomycin (VANCOREADY) IVPB 2000  mg/400 mL  Status:  Discontinued        2,000 mg 200 mL/hr over 120 Minutes Intravenous  Once 09/07/23 1741 09/07/23 1933       Subjective: Today, patient was seen and examined at bedside.  Complains of mild discomfort at the amputated site.  Otherwise feels okay.  No nausea vomiting fever chills or rigor.    Objective: Vitals:   09/11/23 0441 09/11/23 0742  BP: (!) 113/41 (!) 101/44  Pulse: 63 (!) 57  Resp: 17 20  Temp: 97.7 F (36.5 C) 97.7 F (36.5 C)  SpO2: 99% 99%    Intake/Output Summary (Last 24 hours) at 09/11/2023 1146 Last data filed at 09/10/2023 2000 Gross per 24 hour  Intake 340 ml  Output 100 ml  Net 240 ml   Filed Weights   09/07/23 1542 09/08/23 1008  Weight: 102.1 kg 103.4 kg   Body mass index is 32.71  kg/m.   Physical Exam:  GENERAL: Patient is alert awake and oriented. Not in obvious distress.  Obese built HENT: Pallor +  Pupils equally reactive to light. Oral mucosa is moist NECK: is supple, no gross swelling noted. CHEST: Clear to auscultation. No crackles or wheezes.   CVS: S1 and S2 heard, no murmur. Regular rate and rhythm.  ABDOMEN: Soft, non-tender, bowel sounds are present. EXTREMITIES: Bilateral above-knee amputation with staples in place.  Dressing in place. CNS: Cranial nerves are intact. SKIN: warm and dry, bilateral lower knee amputation with staples in place.  Data Review: I have personally reviewed the following laboratory data and studies,  CBC: Recent Labs  Lab 09/07/23 1936 09/08/23 0500 09/08/23 1203 09/08/23 1317 09/09/23 0709 09/09/23 2033 09/10/23 0810 09/11/23 0834  WBC 27.9* 23.8*  --   --  20.3*  --  21.9* 21.2*  NEUTROABS 25.9*  --   --   --   --   --   --   --   HGB 8.0* 6.9*   < > 8.2* 6.7* 8.1* 7.9* 7.3*  HCT 26.4* 23.2*   < > 24.0* 21.2* 25.5* 25.4* 23.3*  MCV 81.0 82.6  --   --  84.1  --  87.0 86.0  PLT 550* 448*  --   --  344  --  370 371   < > = values in this interval not displayed.   Basic  Metabolic Panel: Recent Labs  Lab 09/07/23 1936 09/08/23 0500 09/08/23 1203 09/08/23 1317 09/08/23 2113 09/09/23 0706 09/09/23 0709 09/10/23 0810  NA 139 138 138 138 136  --  133* 132*  K 2.5* 2.9* 3.7 3.5 3.7  --  3.3* 3.8  CL 96* 97*  --   --  98  --  95* 94*  CO2 30 31  --   --  26  --  28 23  GLUCOSE 125* 102*  --   --  136*  --  135* 110*  BUN 16 19  --   --  25*  --  28* 36*  CREATININE 3.52* 4.16*  --   --  4.91*  --  5.49* 6.67*  CALCIUM 7.6* 7.4*  --   --  7.6*  --  6.8* 6.5*  MG  --   --   --   --   --   --  1.7 1.8  PHOS  --   --   --   --   --  4.8*  --   --    Liver Function Tests: Recent Labs  Lab 09/07/23 1936 09/08/23 0500  AST 79* 66*  ALT 49* 40  ALKPHOS 71 54  BILITOT 0.9 1.0  PROT 7.2 6.2*  ALBUMIN 1.7* 1.8*   No results for input(s): "LIPASE", "AMYLASE" in the last 168 hours. No results for input(s): "AMMONIA" in the last 168 hours. Cardiac Enzymes: No results for input(s): "CKTOTAL", "CKMB", "CKMBINDEX", "TROPONINI" in the last 168 hours. BNP (last 3 results) Recent Labs    02/05/23 1408 09/07/23 1936  BNP 553.7* 484.4*    ProBNP (last 3 results) No results for input(s): "PROBNP" in the last 8760 hours.  CBG: Recent Labs  Lab 09/10/23 0819 09/10/23 1153 09/10/23 1626 09/10/23 2156 09/11/23 0542  GLUCAP 119* 144* 114* 181* 162*   Recent Results (from the past 240 hours)  Culture, blood (Routine x 2)     Status: None (Preliminary result)   Collection Time: 09/07/23  3:52 PM   Specimen:  BLOOD  Result Value Ref Range Status   Specimen Description BLOOD SITE NOT SPECIFIED  Final   Special Requests   Final    BOTTLES DRAWN AEROBIC AND ANAEROBIC Blood Culture results may not be optimal due to an inadequate volume of blood received in culture bottles   Culture   Final    NO GROWTH 4 DAYS Performed at [facility], [address] [place], [place] [address]    Report Status PENDING  Incomplete  Culture, blood (Routine x 2)      Status: None (Preliminary result)   Collection Time: 09/07/23  7:36 PM   Specimen: BLOOD  Result Value Ref Range Status   Specimen Description BLOOD SITE NOT SPECIFIED  Final   Special Requests   Final    BOTTLES DRAWN AEROBIC AND ANAEROBIC Blood Culture results may not be optimal due to an inadequate volume of blood received in culture bottles   Culture   Final    NO GROWTH 4 DAYS Performed at [facility], [address] [place], [place] [address]    Report Status PENDING  Incomplete  Resp panel by RT-PCR (RSV, Flu A&B, Covid) Peripheral     Status: None   Collection Time: 09/07/23  7:36 PM   Specimen: Peripheral; Nasal Swab  Result Value Ref Range Status   SARS Coronavirus 2 by RT PCR NEGATIVE NEGATIVE Final   Influenza A by PCR NEGATIVE NEGATIVE Final   Influenza B by PCR NEGATIVE NEGATIVE Final    Comment: (NOTE) The Xpert Xpress SARS-CoV-2/FLU/RSV plus assay is intended as an aid in the diagnosis of influenza from Nasopharyngeal swab specimens and should not be used as a sole basis for treatment. Nasal washings and aspirates are unacceptable for Xpert Xpress SARS-CoV-2/FLU/RSV testing.  Fact Sheet for Patients: BloggerCourse.com  Fact Sheet for Healthcare Providers: SeriousBroker.it  This test is not yet approved or cleared by the [place] FDA and has been authorized for detection and/or diagnosis of SARS-CoV-2 by FDA under an Emergency Use Authorization (EUA). This EUA will remain in effect (meaning this test can be used) for the duration of the COVID-19 declaration under Section 564(b)(1) of the Act, 21 U.S.C. section 360bbb-3(b)(1), unless the authorization is terminated or revoked.     Resp Syncytial Virus by PCR NEGATIVE NEGATIVE Final    Comment: (NOTE) Fact Sheet for Patients: BloggerCourse.com  Fact Sheet for Healthcare  Providers: SeriousBroker.it  This test is not yet approved or cleared by the [place] FDA and has been authorized for detection and/or diagnosis of SARS-CoV-2 by FDA under an Emergency Use Authorization (EUA). This EUA will remain in effect (meaning this test can be used) for the duration of the COVID-19 declaration under Section 564(b)(1) of the Act, 21 U.S.C. section 360bbb-3(b)(1), unless the authorization is terminated or revoked.  Performed at [facility], [address] [place], [place] [address]      Studies: No results found.     Joycelyn Das, MD  [facility] 09/11/2023  If 7PM-7AM, please contact night-coverage

## 2023-09-12 DIAGNOSIS — I96 Gangrene, not elsewhere classified: Secondary | ICD-10-CM | POA: Diagnosis not present

## 2023-09-12 LAB — CBC
HCT: 23 % — ABNORMAL LOW (ref 39.0–52.0)
Hemoglobin: 7.5 g/dL — ABNORMAL LOW (ref 13.0–17.0)
MCH: 27.4 pg (ref 26.0–34.0)
MCHC: 32.6 g/dL (ref 30.0–36.0)
MCV: 83.9 fL (ref 80.0–100.0)
Platelets: 404 10*3/uL — ABNORMAL HIGH (ref 150–400)
RBC: 2.74 MIL/uL — ABNORMAL LOW (ref 4.22–5.81)
RDW: 18.5 % — ABNORMAL HIGH (ref 11.5–15.5)
WBC: 19.1 10*3/uL — ABNORMAL HIGH (ref 4.0–10.5)
nRBC: 0 % (ref 0.0–0.2)

## 2023-09-12 LAB — CULTURE, BLOOD (ROUTINE X 2)
Culture: NO GROWTH
Culture: NO GROWTH

## 2023-09-12 LAB — BASIC METABOLIC PANEL
Anion gap: 10 (ref 5–15)
BUN: 27 mg/dL — ABNORMAL HIGH (ref 8–23)
CO2: 24 mmol/L (ref 22–32)
Calcium: 6.3 mg/dL — CL (ref 8.9–10.3)
Chloride: 97 mmol/L — ABNORMAL LOW (ref 98–111)
Creatinine, Ser: 5.17 mg/dL — ABNORMAL HIGH (ref 0.61–1.24)
GFR, Estimated: 11 mL/min — ABNORMAL LOW (ref >60)
Glucose, Bld: 109 mg/dL — ABNORMAL HIGH (ref 70–99)
Potassium: 3 mmol/L — ABNORMAL LOW (ref 3.5–5.1)
Sodium: 131 mmol/L — ABNORMAL LOW (ref 135–145)

## 2023-09-12 LAB — GLUCOSE, CAPILLARY
Glucose-Capillary: 104 mg/dL — ABNORMAL HIGH (ref 70–99)
Glucose-Capillary: 90 mg/dL (ref 70–99)
Glucose-Capillary: 150 mg/dL — ABNORMAL HIGH (ref 70–99)
Glucose-Capillary: 111 mg/dL — ABNORMAL HIGH (ref 70–99)

## 2023-09-12 LAB — MAGNESIUM: Magnesium: 1.7 mg/dL (ref 1.7–2.4)

## 2023-09-12 MED ORDER — POTASSIUM CHLORIDE CRYS ER 20 MEQ PO TBCR
40.0000 meq | EXTENDED_RELEASE_TABLET | Freq: Once | ORAL | Status: AC
  Administered 2023-09-12: 40 meq via ORAL
  Filled 2023-09-12: qty 2

## 2023-09-12 NOTE — Progress Notes (Signed)
 [facility] Progress Note  Subjective:  Seen in room Not feeling really good  Vitals:   09/11/23 1824 09/11/23 2000 09/11/23 2024 09/12/23 0812  BP: (!) 100/42  (!) 111/54 132/83  Pulse: 78 (!) 134 77 66  Resp: 18 (!) 25 20 18   Temp: 98.7 F (37.1 C)  98.5 F (36.9 C) 97.6 F (36.4 C)  TempSrc: Oral  Oral Oral  SpO2: 97% (!) 74% 100% 100%  Weight:      Height:        Exam: Gen alert, no distress Sclera anicteric, throat clear  No jvd or bruits Chest clear bilat to bases  RRR no MRG Abd soft ntnd no mass or ascites +bs MS bilat AKA w/ wrapped stumps Ext 1+ bilat stump edema Neuro is alert, Ox 3 , nf    RUA AVF +bruit, +bruising       Renal-related home meds: Midodrine to 5 mg daily  Torsemide 40 mg twice daily Others: Statin, Singulair, Lantus insulin, Breo elliptica, dulaglutide, Eliquis, amiodarone      OP HD: [facility] MWF (pt says it is M-F)  3h  B400   101kg  2K bath   RUA AVF  Heparin none - hectorol 1 mcg  - mircera 225 mcg q 2 wks, last 3/07       Assessment/ Plan: Gangrene right lower leg: Progressive issue, also nonhealing left BKA site.  Patient underwent bilateral AKA on 09/08/2023.  Per [facility]. ESRD: on HD M-F.  HD here yesterday. Next HD Monday.  Chronic hypotension: On midodrine at home, continue here Volume: 3.5 kg down by wts, which should be the case after sig amputation. Soft BP's. Get UF result from last HD.  Anemia of esrd:.  Hemoglobin 6.7-8.5 here, has received PRBCs here. Secondary hyperparathyroidism: Corrected calcium and phos are in range. Continue any binders with meals. PAF Chronic systolic HF           Rob Keirstin Musil MD  [facility] 09/12/2023, 11:34 AM  Recent Labs  Lab 09/07/23 1936 09/08/23 0500 09/08/23 1203 09/09/23 0706 09/09/23 0709 09/10/23 0810 09/11/23 0834 09/12/23 0304  HGB 8.0* 6.9*   < >  --    < > 7.9* 7.3* 7.5*  ALBUMIN 1.7* 1.8*  --   --   --   --   --   --   CALCIUM 7.6* 7.4*   < >  --    <  > 6.5*  --  6.3*  PHOS  --   --   --  4.8*  --   --   --   --   CREATININE 3.52* 4.16*   < >  --    < > 6.67*  --  5.17*  K 2.5* 2.9*   < >  --    < > 3.8  --  3.0*   < > = values in this interval not displayed.   Recent Labs  Lab 09/08/23 0617  IRON 11*  TIBC NOT CALCULATED  FERRITIN 669*   Inpatient medications:  amiodarone  200 mg Oral BID   apixaban  5 mg Oral BID   atorvastatin  80 mg Oral Daily   Chlorhexidine Gluconate Cloth  6 each Topical Q0600   doxercalciferol  1 mcg Intravenous Once per day on Monday Friday   fluticasone furoate-vilanterol  2 puff Inhalation Daily   insulin aspart  0-9 Units Subcutaneous TID WC   midodrine  5 mg Oral Daily   montelukast  10  mg Oral Daily   multivitamin  1 tablet Oral Daily     acetaminophen **OR** acetaminophen, albuterol, HYDROcodone-acetaminophen, HYDROmorphone (DILAUDID) injection, ondansetron **OR** ondansetron (ZOFRAN) IV

## 2023-09-12 NOTE — [place] FL2 (Signed)
 [place] MEDICAID FL2 LEVEL OF CARE FORM     IDENTIFICATION  Patient Name: [name] [name] Birthdate: 20-Mar-1956 Sex: male Admission Date (Current Location): 09/07/2023  [facility] and [place] Number:  Producer, television/film/video and Address:  [facility], [address] [place], [place] [address]      Provider Number: [id number]  Attending Physician Name and Address:  Joycelyn Das, MD  Relative Name and Phone Number:       Current Level of Care: Hospital Recommended Level of Care: Skilled Nursing Facility Prior Approval Number:    Date Approved/Denied:   PASRR Number: [id number]  Discharge Plan: SNF    Current Diagnoses: Patient Active Problem List   Diagnosis Date Noted   Gangrene of lower extremity (HCC) 09/07/2023   Atherosclerosis of native arteries of extremities with rest pain, right leg (HCC) 03/23/2023   Critical limb ischemia of right lower extremity (HCC) 03/19/2023   Unspecified protein-calorie malnutrition (HCC) 03/09/2023   Amputation stump infection (HCC) 02/05/2023   Limb ischemia 01/26/2023   PAD (peripheral artery disease) (HCC) 01/26/2023   S/P BKA (below knee amputation), left (HCC) 01/26/2023   Obstructive airway disease (HCC) 01/26/2023   End stage renal disease (HCC) 03/24/2022   Coronary artery disease 01/17/2022   Allergy, unspecified, initial encounter 12/05/2021   Morbid (severe) obesity due to excess calories (HCC) 12/05/2021   Anemia in chronic kidney disease 12/03/2021   Cardiomyopathy, unspecified (HCC) 12/03/2021   Dependence on renal dialysis (HCC) 12/03/2021   Personal history of nicotine dependence 12/03/2021   Long term (current) use of anticoagulants 12/03/2021   Type 2 diabetes mellitus with diabetic neuropathy, unspecified (HCC) 12/03/2021   S/P CABG x 3 11/12/2021   Chronic diastolic CHF (congestive heart failure) (HCC) 07/15/2021   Dysrhythmia 07/15/2021   Erectile dysfunction 07/15/2021   Back  pain due to inflammatory process 07/15/2021   OSA (obstructive sleep apnea) 07/15/2021   Testicular hypofunction 07/15/2021   High risk medication use 05/26/2018   Chronic anticoagulation 02/25/2018   LV dysfunction 04/28/2017   Sinus node dysfunction (HCC) 04/21/2016   Bradycardia 01/25/2015   Benign essential HTN 07/30/2014   Diabetic neuropathy (HCC) 07/28/2014   Mixed hyperlipidemia 07/28/2014   Paroxysmal atrial fibrillation (HCC) 07/28/2014    Orientation RESPIRATION BLADDER Height & Weight     Self, Time, Situation, Place  Normal Continent Weight: 215 lb 2.7 oz (97.6 kg) Height:  5\' 10"  (177.8 cm)  BEHAVIORAL SYMPTOMS/MOOD NEUROLOGICAL BOWEL NUTRITION STATUS      Continent Diet (See dc summary)  AMBULATORY STATUS COMMUNICATION OF NEEDS Skin   Extensive Assist Verbally PU Stage and Appropriate Care, Other (Comment) (Pressure Injury - Coccyx Mid Stage 3; Wound / Incision - Incontinence Associated Dermatitis Buttocks Posterior, Medial Bleeding with moisture damage; Incision - Left and Right legs)                       Personal Care Assistance Level of Assistance  Bathing, Feeding, Dressing Bathing Assistance: Limited assistance Feeding assistance: Limited assistance Dressing Assistance: Limited assistance     Functional Limitations Info  Sight, Hearing, Speech Sight Info: Adequate Hearing Info: Adequate Speech Info: Adequate    SPECIAL CARE FACTORS FREQUENCY  OT (By licensed OT), PT (By licensed PT)     PT Frequency: 5x week OT Frequency: 5x week            Contractures Contractures Info: Not present    Additional Factors Info  Code  Status, Allergies, Insulin Sliding Scale Code Status Info: Full Allergies Info: Tape   Insulin Sliding Scale Info: see dc summary       Current Medications (09/12/2023):  This is the current hospital active medication list Current Facility-Administered Medications  Medication Dose Route Frequency Provider Last Rate  Last Admin   acetaminophen (TYLENOL) tablet 650 mg  650 mg Oral Q6H PRN Emilie Rutter, PA-C       Or   acetaminophen (TYLENOL) suppository 650 mg  650 mg Rectal Q6H PRN Emilie Rutter, PA-C       albuterol (PROVENTIL) (2.5 MG/3ML) 0.083% nebulizer solution 2.5 mg  2.5 mg Nebulization Q2H PRN Emilie Rutter, PA-C       amiodarone (PACERONE) tablet 200 mg  200 mg Oral BID Pokhrel, Laxman, MD   200 mg at 09/12/23 0811   apixaban (ELIQUIS) tablet 5 mg  5 mg Oral BID Pokhrel, Laxman, MD   5 mg at 09/12/23 0811   atorvastatin (LIPITOR) tablet 80 mg  80 mg Oral Daily Emilie Rutter, PA-C   80 mg at 09/12/23 0932   Chlorhexidine Gluconate Cloth 2 % PADS 6 each  6 each Topical Q0600 Delano Metz, MD   6 each at 09/12/23 0529   doxercalciferol (HECTOROL) injection 1 mcg  1 mcg Intravenous Once per day on Monday Friday Delano Metz, MD   1 mcg at 09/11/23 1744   fluticasone furoate-vilanterol (BREO ELLIPTA) 200-25 MCG/ACT 2 puff  2 puff Inhalation Daily Pokhrel, Laxman, MD   2 puff at 09/12/23 0811   HYDROcodone-acetaminophen (NORCO/VICODIN) 5-325 MG per tablet 1-2 tablet  1-2 tablet Oral Q4H PRN Emilie Rutter, PA-C   2 tablet at 09/12/23 0316   HYDROmorphone (DILAUDID) injection 0.5-1 mg  0.5-1 mg Intravenous Q2H PRN Emilie Rutter, PA-C       insulin aspart (novoLOG) injection 0-9 Units  0-9 Units Subcutaneous TID WC Pokhrel, Laxman, MD   1 Units at 09/11/23 1153   midodrine (PROAMATINE) tablet 5 mg  5 mg Oral Daily Pokhrel, Laxman, MD   5 mg at 09/12/23 0811   montelukast (SINGULAIR) tablet 10 mg  10 mg Oral Daily Pokhrel, Laxman, MD   10 mg at 09/12/23 3557   multivitamin (RENA-VIT) tablet 1 tablet  1 tablet Oral Daily Pokhrel, Laxman, MD   1 tablet at 09/12/23 0811   ondansetron (ZOFRAN) tablet 4 mg  4 mg Oral Q6H PRN Emilie Rutter, PA-C       Or   ondansetron (ZOFRAN) injection 4 mg  4 mg Intravenous Q6H PRN Emilie Rutter, PA-C         Discharge Medications: Please see  discharge summary for a list of discharge medications.  Relevant Imaging Results:  Relevant Lab Results:   Additional Information SSN [id number] [place], [place]

## 2023-09-12 NOTE — Progress Notes (Signed)
 PROGRESS NOTE  [name] [name] ZOX:[id number] DOB: 1955-09-05 DOA: 09/07/2023 PCP: [name], PA-C   LOS: 5 days   Brief narrative:   [name] [name] is a 67 y.o. male with medical history significant of end-stage renal disease on hemodialysis Monday  Friday, anemia, hypertension, history of coronary artery disease status post CABG, type 2 diabetes, congestive heart failure with ejection fraction of 30%, obstructive sleep apnea on CPAP, paroxysmal atrial fibrillation, history of severe peripheral vascular disease with critical limb ischemia status post atherectomy and stenting of left SFA and popliteal artery on 12/29/2022 by Dr. Randie Heinz subsequently followed by left BKA on 01/26/2023.  Also had right heel ulcer leading to right common femoral to below-knee popliteal artery bypass on  03/19/2023. Since then patient has had difficulty with healing of both his left BKA wound as well as non-healing ulcer on right heel with foul-smelling discharge from the right foot.  Patient then presented to hospital.  In the ED patient was seen by vascular surgery and was noted to have severe necrotic changes to the right lower extremities and nonhealing below-knee amputation on the left.  Patient was also febrile and slightly hypotensive and lactic acid was elevated at 2.5.  Chest x-ray showed masslike opacity in the suprahilar region of the left.  Right foot x-ray showed diffuse soft tissue edema and extensive subcu gas with destructive changes of the first metatarsal joint likely osteomyelitis and septic arthritis.  Patient was given cefepime and Zyvox and was considered for admission to the hospital for further evaluation and treatment.   Assessment/Plan: Principal Problem:   Gangrene of lower extremity (HCC) Active Problems:   Mixed hyperlipidemia   Paroxysmal atrial fibrillation (HCC)   Chronic diastolic CHF (congestive heart failure) (HCC)   S/P CABG x 3   End stage renal disease (HCC)      Nonhealing right lower extremity ulcer with progressive gangrene  Patient with history of severe PVD with multiple intervention in the past.    MRI of the right ankle and foot with osteomyelitis and soft tissue ulceration.  Patient has undergone right above-knee amputation on 09/08/2023 by vascular surgery.  Received 48 hours of antibiotic postop.  WBC at 19.1 and has started to downtrend from 21K.[name]  No fever.  Will continue to observe off antibiotic.[name]   Blood cultures negative in 5 days.  Nonhealing left below-knee amputation site  Patient underwent left above-knee amputation on 09/08/2023.  Will need to follow-up with vascular surgery as outpatient.  [facility] consulted for hemodialysis.   Patient states that he goes to dialysis twice a week.  Continue midodrine.    Mild hypokalemia.  Potassium today at 3.0.  Will replenish orally.   Acute blood loss anemia on anemia of chronic kidney disease.  Hemoglobin today at 7.5 from 7.3 yesterday.  Has remained stable.  Received 1 unit of packed RBC during hospitalization.  Continue Eliquis.  History of hypotension  Blood pressure better after resuming midodrine.    CAD s/p CABG No active issues at this time.  Continue Lipitor   Diabetes mellitus type 2. Continue sliding scale insulin, on regular diet at this time.  Glycemic control is adequate.  Patient is on Trulicity and Lantus 20 units at home.  Latest POC glucose of 1 4  Chronic systolic heart failure. Continue hemodialysis for fluid management.  Appears to be compensated at this time.   OSA Continue CPAP at bedtime.  Continue inhalers.   Paroxysmal atrial fibrillation. Resume albuterol.  Has been started on Eliquis.   COPD -continue on breo and bronchodilators.  Appears compensated.  Class I obesity.  Body mass index is 30.87 kg/m. Would benefit from ongoing weight loss as outpatient.  DVT prophylaxis: Will resume Eliquis. apixaban (ELIQUIS) tablet 5 mg   Disposition: .   PT OT evaluation recommended skilled nursing facility.  Status is: Inpatient  Remains inpatient appropriate because: Status post bilateral above-knee amputation, need for skilled nursing facility placement.    Code Status:     Code Status: Full Code  Family Communication: Spoke with the patient's son at bedside  Consultants: Vascular surgery Nephrology  Procedures: Bilateral above-knee amputation on 09/08/2023  Anti-infectives:  None  Subjective: Today, patient was seen and examined at bedside.  Complains of not feeling too well.  He states that God has things for him, feels frustrated.  Denies feeling of hurting himself.  Patient's son at bedside.   Objective: Vitals:   09/11/23 2024 09/12/23 0812  BP: (!) 111/54 132/83  Pulse: 77 66  Resp: 20 18  Temp: 98.5 F (36.9 C) 97.6 F (36.4 C)  SpO2: 100% 100%    Intake/Output Summary (Last 24 hours) at 09/12/2023 1141 Last data filed at 09/11/2023 1200 Gross per 24 hour  Intake --  Output 125 ml  Net -125 ml   Filed Weights   09/07/23 1542 09/08/23 1008 09/11/23 1323  Weight: 102.1 kg 103.4 kg 97.6 kg   Body mass index is 30.87 kg/m.   Physical Exam:  GENERAL: Patient is alert awake and oriented. Not in obvious distress.  Obese built liquid blood. HENT: Pallor +  Pupils equally reactive to light. Oral mucosa is moist NECK: is supple, no gross swelling noted. CHEST: Clear to auscultation. No crackles or wheezes.   CVS: S1 and S2 heard, no murmur. Regular rate and rhythm.  ABDOMEN: Soft, non-tender, bowel sounds are present. EXTREMITIES: Bilateral above-knee amputation with staples in place.  Dressing in place. CNS: Cranial nerves are intact. SKIN: warm and dry, bilateral lower knee amputation with staples in place.  Data Review: I have personally reviewed the following laboratory data and studies,  CBC: Recent Labs  Lab 09/07/23 1936 09/08/23 0500 09/08/23 1203 09/09/23 0709 09/09/23 2033  09/10/23 0810 09/11/23 0834 09/12/23 0304  WBC 27.9* 23.8*  --  20.3*  --  21.9* 21.2* 19.1*  NEUTROABS 25.9*  --   --   --   --   --   --   --   HGB 8.0* 6.9*   < > 6.7* 8.1* 7.9* 7.3* 7.5*  HCT 26.4* 23.2*   < > 21.2* 25.5* 25.4* 23.3* 23.0*  MCV 81.0 82.6  --  84.1  --  87.0 86.0 83.9  PLT 550* 448*  --  344  --  370 371 404*   < > = values in this interval not displayed.   Basic Metabolic Panel: Recent Labs  Lab 09/08/23 0500 09/08/23 1203 09/08/23 1317 09/08/23 2113 09/09/23 0706 09/09/23 0709 09/10/23 0810 09/12/23 0304  NA 138   < > 138 136  --  133* 132* 131*  K 2.9*   < > 3.5 3.7  --  3.3* 3.8 3.0*  CL 97*  --   --  98  --  95* 94* 97*  CO2 31  --   --  26  --  28 23 24   GLUCOSE 102*  --   --  136*  --  135* 110* 109*  BUN 19  --   --  25*  --  28* 36* 27*  CREATININE 4.16*  --   --  4.91*  --  5.49* 6.67* 5.17*  CALCIUM 7.4*  --   --  7.6*  --  6.8* 6.5* 6.3*  MG  --   --   --   --   --  1.7 1.8 1.7  PHOS  --   --   --   --  4.8*  --   --   --    < > = values in this interval not displayed.   Liver Function Tests: Recent Labs  Lab 09/07/23 1936 09/08/23 0500  AST 79* 66*  ALT 49* 40  ALKPHOS 71 54  BILITOT 0.9 1.0  PROT 7.2 6.2*  ALBUMIN 1.7* 1.8*   No results for input(s): "LIPASE", "AMYLASE" in the last 168 hours. No results for input(s): "AMMONIA" in the last 168 hours. Cardiac Enzymes: No results for input(s): "CKTOTAL", "CKMB", "CKMBINDEX", "TROPONINI" in the last 168 hours. BNP (last 3 results) Recent Labs    02/05/23 1408 09/07/23 1936  BNP 553.7* 484.4*    ProBNP (last 3 results) No results for input(s): "PROBNP" in the last 8760 hours.  CBG: Recent Labs  Lab 09/11/23 0542 09/11/23 1148 09/11/23 1825 09/11/23 2120 09/12/23 0633  GLUCAP 162* 139* 105* 117* 104*   Recent Results (from the past 240 hours)  Culture, blood (Routine x 2)     Status: None   Collection Time: 09/07/23  3:52 PM   Specimen: BLOOD  Result Value Ref  Range Status   Specimen Description BLOOD SITE NOT SPECIFIED  Final   Special Requests   Final    BOTTLES DRAWN AEROBIC AND ANAEROBIC Blood Culture results may not be optimal due to an inadequate volume of blood received in culture bottles   Culture   Final    NO GROWTH 5 DAYS Performed at [facility], [address] [place], [place] [address]    Report Status 09/12/2023 FINAL  Final  Culture, blood (Routine x 2)     Status: None   Collection Time: 09/07/23  7:36 PM   Specimen: BLOOD  Result Value Ref Range Status   Specimen Description BLOOD SITE NOT SPECIFIED  Final   Special Requests   Final    BOTTLES DRAWN AEROBIC AND ANAEROBIC Blood Culture results may not be optimal due to an inadequate volume of blood received in culture bottles   Culture   Final    NO GROWTH 5 DAYS Performed at [facility], [address] [place], [place] [address]    Report Status 09/12/2023 FINAL  Final  Resp panel by RT-PCR (RSV, Flu A&B, Covid) Peripheral     Status: None   Collection Time: 09/07/23  7:36 PM   Specimen: Peripheral; Nasal Swab  Result Value Ref Range Status   SARS Coronavirus 2 by RT PCR NEGATIVE NEGATIVE Final   Influenza A by PCR NEGATIVE NEGATIVE Final   Influenza B by PCR NEGATIVE NEGATIVE Final    Comment: (NOTE) The Xpert Xpress SARS-CoV-2/FLU/RSV plus assay is intended as an aid in the diagnosis of influenza from Nasopharyngeal swab specimens and should not be used as a sole basis for treatment. Nasal washings and aspirates are unacceptable for Xpert Xpress SARS-CoV-2/FLU/RSV testing.  Fact Sheet for Patients: BloggerCourse.com  Fact Sheet for Healthcare Providers: SeriousBroker.it  This test is not yet approved or cleared by the [place] FDA and has been authorized for detection and/or diagnosis of  SARS-CoV-2 by FDA under an Emergency Use Authorization (EUA). This EUA will remain in effect (meaning  this test can be used) for the duration of the COVID-19 declaration under Section 564(b)(1) of the Act, 21 U.S.C. section 360bbb-3(b)(1), unless the authorization is terminated or revoked.     Resp Syncytial Virus by PCR NEGATIVE NEGATIVE Final    Comment: (NOTE) Fact Sheet for Patients: BloggerCourse.com  Fact Sheet for Healthcare Providers: SeriousBroker.it  This test is not yet approved or cleared by the [place] FDA and has been authorized for detection and/or diagnosis of SARS-CoV-2 by FDA under an Emergency Use Authorization (EUA). This EUA will remain in effect (meaning this test can be used) for the duration of the COVID-19 declaration under Section 564(b)(1) of the Act, 21 U.S.C. section 360bbb-3(b)(1), unless the authorization is terminated or revoked.  Performed at [facility], [address], [place], [place] [address]      Studies: No results found.     Joycelyn Das, MD  [facility] 09/12/2023  If 7PM-7AM, please contact night-coverage

## 2023-09-12 NOTE — Progress Notes (Signed)
 Notified on-call physician of critical calcium of 6.3.

## 2023-09-12 NOTE — TOC Initial Note (Signed)
 Transition of Care [facility]) - Initial/Assessment Note    Patient Details  Name: [name] [name] MRN: [id number] Date of Birth: 1955/09/30  [facility] CM/SW Contact:    Michaela Corner, LCSWA Phone Number: 09/12/2023, 10:32 AM  Clinical Narrative:    CSW met patient and his son at bedside. Patient is agreeable to going to SNF at this time. Per patients son, he would like CSW to fax patient referral to [facility]. SNF workup done, will need bed offers.   TOC will continue to follow.     Expected Discharge Plan: Skilled Nursing Facility Barriers to Discharge: Continued Medical Work up, SNF Pending bed offer, Insurance Authorization   Patient Goals and CMS Choice Patient states their goals for this hospitalization and ongoing recovery are:: To get better          Expected Discharge Plan and Services In-house Referral: Clinical Social Work     Living arrangements for the past 2 months: Single Family Home                   DME Agency: [employer]                  Prior Living Arrangements/Services Living arrangements for the past 2 months: Single Family Home Lives with:: Spouse Patient language and need for interpreter reviewed:: No Do you feel safe going back to the place where you live?: Yes      Need for Family Participation in Patient Care: No (Comment) Care giver support system in place?: No (comment) Current home services: DME (rollator) Criminal Activity/Legal Involvement Pertinent to Current Situation/Hospitalization: No - Comment as needed  Activities of Daily Living   ADL Screening (condition at time of admission) Independently performs ADLs?: Yes (appropriate for developmental age) Is the patient deaf or have difficulty hearing?: No Does the patient have difficulty seeing, even when wearing glasses/contacts?: No Does the patient have difficulty concentrating, remembering, or making decisions?: No  Permission Sought/Granted Permission  sought to share information with : Facility Industrial/product designer granted to share information with : Yes, Verbal Permission Granted     Permission granted to share info w AGENCY: SNFs        Emotional Assessment Appearance:: Appears stated age Attitude/Demeanor/Rapport: Engaged Affect (typically observed): Stable Orientation: : Oriented to Self, Oriented to Place, Oriented to  Time, Oriented to Situation Alcohol / Substance Use: Not Applicable Psych Involvement: No (comment)  Admission diagnosis:  Necrotizing fasciitis (HCC) [M72.6] Hypokalemia [E87.6] Gangrene of lower extremity (HCC) [I96] Sepsis, due to unspecified organism, unspecified whether acute organ dysfunction present [facility]) [A41.9] Patient Active Problem List   Diagnosis Date Noted   Gangrene of lower extremity (HCC) 09/07/2023   Atherosclerosis of native arteries of extremities with rest pain, right leg (HCC) 03/23/2023   Critical limb ischemia of right lower extremity (HCC) 03/19/2023   Unspecified protein-calorie malnutrition (HCC) 03/09/2023   Amputation stump infection (HCC) 02/05/2023   Limb ischemia 01/26/2023   PAD (peripheral artery disease) (HCC) 01/26/2023   S/P BKA (below knee amputation), left (HCC) 01/26/2023   Obstructive airway disease (HCC) 01/26/2023   End stage renal disease (HCC) 03/24/2022   Coronary artery disease 01/17/2022   Allergy, unspecified, initial encounter 12/05/2021   Morbid (severe) obesity due to excess calories (HCC) 12/05/2021   Anemia in chronic kidney disease 12/03/2021   Cardiomyopathy, unspecified (HCC) 12/03/2021   Dependence on renal dialysis (HCC) 12/03/2021   Personal history of nicotine dependence 12/03/2021   Long  term (current) use of anticoagulants 12/03/2021   Type 2 diabetes mellitus with diabetic neuropathy, unspecified (HCC) 12/03/2021   S/P CABG x 3 11/12/2021   Chronic diastolic CHF (congestive heart failure) (HCC) 07/15/2021   Dysrhythmia  07/15/2021   Erectile dysfunction 07/15/2021   Back pain due to inflammatory process 07/15/2021   OSA (obstructive sleep apnea) 07/15/2021   Testicular hypofunction 07/15/2021   High risk medication use 05/26/2018   Chronic anticoagulation 02/25/2018   LV dysfunction 04/28/2017   Sinus node dysfunction (HCC) 04/21/2016   Bradycardia 01/25/2015   Benign essential HTN 07/30/2014   Diabetic neuropathy (HCC) 07/28/2014   Mixed hyperlipidemia 07/28/2014   Paroxysmal atrial fibrillation (HCC) 07/28/2014   PCP:  [name], PA-C Pharmacy:   [facility] - [place], [place] - [address] [place] [address] Phone: [phone] Fax: [phone]  [facility] [address] [place], [place] - [address] [address] [address] [place] [address] Phone: [phone] Fax: [phone]     Social Drivers of Health (SDOH) Social History: SDOH Screenings   Food Insecurity: No Food Insecurity (09/08/2023)  Housing: Low Risk  (09/08/2023)  Transportation Needs: No Transportation Needs (09/08/2023)  Utilities: Not At Risk (09/08/2023)  Social Connections: Moderately Isolated (09/08/2023)  Tobacco Use: Medium Risk (09/08/2023)   SDOH Interventions:     Readmission Risk Interventions    01/29/2023   11:34 AM  Readmission Risk Prevention Plan  Transportation Screening Complete  PCP or Specialist Appt within 5-7 Days Complete  Home Care Screening Complete  Medication Review (RN CM) Complete

## 2023-09-13 DIAGNOSIS — I96 Gangrene, not elsewhere classified: Secondary | ICD-10-CM | POA: Diagnosis not present

## 2023-09-13 LAB — GLUCOSE, CAPILLARY
Glucose-Capillary: 123 mg/dL — ABNORMAL HIGH (ref 70–99)
Glucose-Capillary: 192 mg/dL — ABNORMAL HIGH (ref 70–99)
Glucose-Capillary: 163 mg/dL — ABNORMAL HIGH (ref 70–99)
Glucose-Capillary: 167 mg/dL — ABNORMAL HIGH (ref 70–99)
Glucose-Capillary: 154 mg/dL — ABNORMAL HIGH (ref 70–99)

## 2023-09-13 LAB — BASIC METABOLIC PANEL
Anion gap: 11 (ref 5–15)
BUN: 39 mg/dL — ABNORMAL HIGH (ref 8–23)
CO2: 25 mmol/L (ref 22–32)
Calcium: 6.7 mg/dL — ABNORMAL LOW (ref 8.9–10.3)
Chloride: 95 mmol/L — ABNORMAL LOW (ref 98–111)
Creatinine, Ser: 6.2 mg/dL — ABNORMAL HIGH (ref 0.61–1.24)
GFR, Estimated: 9 mL/min — ABNORMAL LOW (ref >60)
Glucose, Bld: 118 mg/dL — ABNORMAL HIGH (ref 70–99)
Potassium: 3.4 mmol/L — ABNORMAL LOW (ref 3.5–5.1)
Sodium: 131 mmol/L — ABNORMAL LOW (ref 135–145)

## 2023-09-13 LAB — CBC
HCT: 22.9 % — ABNORMAL LOW (ref 39.0–52.0)
Hemoglobin: 7.3 g/dL — ABNORMAL LOW (ref 13.0–17.0)
MCH: 26.9 pg (ref 26.0–34.0)
MCHC: 31.9 g/dL (ref 30.0–36.0)
MCV: 84.5 fL (ref 80.0–100.0)
Platelets: 443 10*3/uL — ABNORMAL HIGH (ref 150–400)
RBC: 2.71 MIL/uL — ABNORMAL LOW (ref 4.22–5.81)
RDW: 19.6 % — ABNORMAL HIGH (ref 11.5–15.5)
WBC: 17.1 10*3/uL — ABNORMAL HIGH (ref 4.0–10.5)
nRBC: 0 % (ref 0.0–0.2)

## 2023-09-13 LAB — MAGNESIUM: Magnesium: 2 mg/dL (ref 1.7–2.4)

## 2023-09-13 MED ORDER — CHLORHEXIDINE GLUCONATE CLOTH 2 % EX PADS
6.0000 | MEDICATED_PAD | Freq: Every day | CUTANEOUS | Status: DC
  Administered 2023-09-15 – 2023-09-20 (×6): 6 via TOPICAL

## 2023-09-13 MED ORDER — POTASSIUM CHLORIDE CRYS ER 20 MEQ PO TBCR
40.0000 meq | EXTENDED_RELEASE_TABLET | Freq: Once | ORAL | Status: AC
  Administered 2023-09-13: 40 meq via ORAL
  Filled 2023-09-13: qty 2

## 2023-09-13 NOTE — Progress Notes (Signed)
   09/13/23 1240  Spiritual Encounters  Type of Visit Initial  Care provided to: Patient  Referral source Other (comment) (Spiritual consult)  Reason for visit Routine spiritual support  OnCall Visit Yes  Spiritual Framework  Presenting Themes Values and beliefs;Community and relationships;Significant life change;Goals in life/care  Community/Connection Family;Friend(s);Significant other  Patient Stress Factors Health changes;Major life changes;Family relationships  Goals  Clinical Care Goals Plans to follow care plan, complete rehab, then go home  Interventions  Spiritual Care Interventions Made Established relationship of care and support;Compassionate presence;Reflective listening;Normalization of emotions;Narrative/life review  Intervention Outcomes  Outcomes Connection to spiritual care;Reduced isolation;Awareness of support;Awareness of health  Spiritual Care Plan  Spiritual Care Issues Still Outstanding No further spiritual care needs at this time (see row info)   Chaplain completed Spiritual Care consult for pt. Pt was receptive to a chaplain visit.   Some spiritual distress noted.  Pt spoke several times about how he thought he was "going to die" prior to being brought into the ED. He said that he called several loved ones to say goodbye. This experience alarmed him, and he is busy trying to understand the experience and his feelings around dying.   Pt spoke freely about his family, marriages, children, and meaningful friendships. Pt does have a support network. Pt spoke about his gun collection, many of which have sentimental value for him. He spoke about the loss of many of these guns. Pt also spoke about his love of cars and working on cars. He acknowledges that the loss of his right leg means he won't be able to drive anymore, which is a significant loss for him.   Chaplain spoke with pt about having an advanced directive on file to aid in his future care. He said that his  loved ones "know" what he wants and what he doesn't want done for him.

## 2023-09-13 NOTE — Consult Note (Signed)
 Please note that the [facility] nursing team is utilizing a standardized work plan to manage patient consults. We are triaging consults and will try to see the patients within 48 hours. Wound photos in the patient's chart allow [place] to consult on the patient in the most efficient and timely manner.    Guneet Delpino [facility], CNS, [employer] [phone]

## 2023-09-13 NOTE — Progress Notes (Signed)
 [facility] Progress Note  Subjective:  Seen in room No c/o's   Vitals:   09/13/23 0801 09/13/23 0802 09/13/23 0849 09/13/23 0855  BP: (!) 121/46 (!) 121/46    Pulse:  67 66   Resp:  20 18   Temp:  98.3 F (36.8 C)    TempSrc:  Oral    SpO2:  97% 98% 100%  Weight:      Height:        Exam: Gen alert, no distress Sclera anicteric, throat clear  No jvd or bruits Chest clear bilat to bases  RRR no MRG Abd soft ntnd no mass or ascites +bs MS bilat AKA w/ wrapped stumps Ext 1+ bilat stump edema Neuro is alert, Ox 3 , nf    RUA AVF +bruit, +bruising       Renal-related home meds: Midodrine to 5 mg daily  Torsemide 40 mg twice daily Others: Statin, Singulair, Lantus insulin, Breo elliptica, dulaglutide, Eliquis, amiodarone      OP HD: [facility] MWF (pt says it is M-F)  3h  B400   101kg  2K bath   RUA AVF  Heparin none - hectorol 1 mcg  - mircera 225 mcg q 2 wks, last 3/07       Assessment/ Plan: Gangrene right lower leg: Progressive issue, also nonhealing left BKA site.  Patient underwent bilateral AKA on 09/08/2023.  Per [facility]. ESRD: on HD 2x per week (M-F).  Next HD Monday.  Chronic hypotension: On midodrine at home, continue here Volume: 3.5 kg down by wts, which should be the case after sig amputation. 3 L off Friday. BP's soft now, lower UF to 1-2 L next HD.  Anemia of esrd:.  Hemoglobin 6.7-8.5 here, has received PRBCs here. Secondary hyperparathyroidism: Corrected calcium and phos are in range.  PAF Chronic systolic HF           Rob Tearia Gibbs MD  [facility] 09/13/2023, 12:02 PM  Recent Labs  Lab 09/07/23 1936 09/08/23 0500 09/08/23 1203 09/09/23 0706 09/09/23 0709 09/12/23 0304 09/13/23 0301  HGB 8.0* 6.9*   < >  --    < > 7.5* 7.3*  ALBUMIN 1.7* 1.8*  --   --   --   --   --   CALCIUM 7.6* 7.4*   < >  --    < > 6.3* 6.7*  PHOS  --   --   --  4.8*  --   --   --   CREATININE 3.52* 4.16*   < >  --    < > 5.17* 6.20*  K 2.5* 2.9*   < >   --    < > 3.0* 3.4*   < > = values in this interval not displayed.   Recent Labs  Lab 09/08/23 0617  IRON 11*  TIBC NOT CALCULATED  FERRITIN 669*   Inpatient medications:  amiodarone  200 mg Oral BID   apixaban  5 mg Oral BID   atorvastatin  80 mg Oral Daily   Chlorhexidine Gluconate Cloth  6 each Topical Q0600   doxercalciferol  1 mcg Intravenous Once per day on Monday Friday   fluticasone furoate-vilanterol  2 puff Inhalation Daily   insulin aspart  0-9 Units Subcutaneous TID WC   midodrine  5 mg Oral Daily   montelukast  10 mg Oral Daily   multivitamin  1 tablet Oral Daily     acetaminophen **OR** acetaminophen, albuterol, HYDROcodone-acetaminophen, HYDROmorphone (DILAUDID) injection, ondansetron **OR** ondansetron (  ZOFRAN) IV

## 2023-09-13 NOTE — Plan of Care (Signed)

## 2023-09-13 NOTE — Progress Notes (Signed)
 Patient has a stage 3 pressure injury on his coccyx and found stage 2 on his left buttocks, skin care done, foam dressing applied.  Transferred pt on air mattress, repositioned frequently.  Patient's stump site dressing changed.

## 2023-09-13 NOTE — Progress Notes (Signed)
 PROGRESS NOTE  [name] [name]:[id number] DOB: May 02, 1956 DOA: 09/07/2023 PCP: [name], PA-C   LOS: 6 days   Brief narrative:   [name] [name] is a 68 y.o. male with medical history significant of end-stage renal disease on hemodialysis Monday  Friday, anemia, hypertension, history of coronary artery disease status post CABG, type 2 diabetes, congestive heart failure with ejection fraction of 30%, obstructive sleep apnea on CPAP, paroxysmal atrial fibrillation, history of severe peripheral vascular disease with critical limb ischemia status post atherectomy and stenting of left SFA and popliteal artery on 12/29/2022 by Dr. Randie Heinz subsequently followed by left BKA on 01/26/2023.  Also had right heel ulcer leading to right common femoral to below-knee popliteal artery bypass on  03/19/2023. Since then patient has had difficulty with healing of both his left BKA wound as well as non-healing ulcer on right heel with foul-smelling discharge from the right foot.  Patient then presented to hospital.  In the ED patient was seen by vascular surgery and was noted to have severe necrotic changes to the right lower extremities and nonhealing below-knee amputation on the left.  Patient was also febrile and slightly hypotensive and lactic acid was elevated at 2.5.  Chest x-ray showed masslike opacity in the suprahilar region of the left.  Right foot x-ray showed diffuse soft tissue edema and extensive subcu gas with destructive changes of the first metatarsal joint likely osteomyelitis and septic arthritis.  Patient was given cefepime and Zyvox and was considered for admission to the hospital for further evaluation and treatment.   Assessment/Plan: Principal Problem:   Gangrene of lower extremity (HCC) Active Problems:   Mixed hyperlipidemia   Paroxysmal atrial fibrillation (HCC)   Chronic diastolic CHF (congestive heart failure) (HCC)   S/P CABG x 3   End stage renal disease (HCC)      Nonhealing right lower extremity ulcer with progressive gangrene  Patient with history of severe PVD with multiple intervention in the past.    MRI of the right ankle and foot with osteomyelitis and soft tissue ulceration.  Patient has undergone right above-knee amputation on 09/08/2023 by vascular surgery.  Received 48 hours of antibiotic postop.  WBC at 17.1 today and has continued to downtrend from 21K.[name]  No fever.    Blood cultures negative in 5 days.  Nonhealing left below-knee amputation site  Patient underwent left above-knee amputation on 09/08/2023.  Will need to follow-up with vascular surgery as outpatient.  [facility] Nephrology consulted for hemodialysis.   Continue midodrine.    Mild hypokalemia.  Potassium today at 3.0.  Will replenish orally.   Acute blood loss anemia on anemia of chronic kidney disease.  Hemoglobin today at 7.3 from 7.5 yesterday.  Has remained stable. Received 1 unit of packed RBC during hospitalization.  Continue Eliquis.  History of hypotension  Blood pressure better after resuming midodrine.    CAD s/p CABG No active issues at this time.  Continue Lipitor   Diabetes mellitus type 2. Continue sliding scale insulin, on regular diet at this time.  Glycemic control is adequate.  Patient is on Trulicity and Lantus 20 units at home.  Latest POC glucose of 163  Chronic systolic heart failure. Continue hemodialysis for fluid management.  Appears to be compensated at this time.   OSA Continue CPAP at bedtime.  Continue inhalers.   Paroxysmal atrial fibrillation. Resume albuterol.  Has been started on Eliquis.   COPD -continue on breo and bronchodilators.  Appears compensated.  Class  I obesity.  Body mass index is 30.87 kg/m. Would benefit from ongoing weight loss as outpatient.  DVT prophylaxis: Will resume Eliquis. apixaban (ELIQUIS) tablet 5 mg   Disposition: .  PT, OT evaluation recommended skilled nursing facility.  Medically stable for  disposition.  Status is: Inpatient  Remains inpatient appropriate because: Status post bilateral above-knee amputation, need for skilled nursing facility placement.    Code Status:     Code Status: Full Code  Family Communication: Spoke with the patient's son at bedside on 09/12/2023  Consultants: Vascular surgery Nephrology  Procedures: Bilateral above-knee amputation on 09/08/2023  Anti-infectives:  None  Subjective: Today, patient was seen and examined at bedside.  Complains of feeling okay today.  Denies any nausea vomiting fever chills or rigor.     Objective: Vitals:   09/13/23 0849 09/13/23 0855  BP:    Pulse: 66   Resp: 18   Temp:    SpO2: 98% 100%   No intake or output data in the 24 hours ending 09/13/23 1145  Filed Weights   09/07/23 1542 09/08/23 1008 09/11/23 1323  Weight: 102.1 kg 103.4 kg 97.6 kg   Body mass index is 30.87 kg/m.   Physical Exam:  GENERAL: Patient is alert awake and oriented. Not in obvious distress.  Obese built  HENT: Pallor +  Pupils equally reactive to light. Oral mucosa is moist NECK: is supple, no gross swelling noted. CHEST: Clear to auscultation. No crackles or wheezes.   CVS: S1 and S2 heard, no murmur. Regular rate and rhythm.  ABDOMEN: Soft, non-tender, bowel sounds are present. EXTREMITIES: Bilateral above-knee amputation with staples in place.  Dressing in place. CNS: Cranial nerves are intact. SKIN: warm and dry, bilateral lower knee amputation with staples in place.  Data Review: I have personally reviewed the following laboratory data and studies,  CBC: Recent Labs  Lab 09/07/23 1936 09/08/23 0500 09/09/23 0709 09/09/23 2033 09/10/23 0810 09/11/23 0834 09/12/23 0304 09/13/23 0301  WBC 27.9*   < > 20.3*  --  21.9* 21.2* 19.1* 17.1*  NEUTROABS 25.9*  --   --   --   --   --   --   --   HGB 8.0*   < > 6.7* 8.1* 7.9* 7.3* 7.5* 7.3*  HCT 26.4*   < > 21.2* 25.5* 25.4* 23.3* 23.0* 22.9*  MCV 81.0   < > 84.1   --  87.0 86.0 83.9 84.5  PLT 550*   < > 344  --  370 371 404* 443*   < > = values in this interval not displayed.   Basic Metabolic Panel: Recent Labs  Lab 09/08/23 2113 09/09/23 0706 09/09/23 0709 09/10/23 0810 09/12/23 0304 09/13/23 0301  NA 136  --  133* 132* 131* 131*  K 3.7  --  3.3* 3.8 3.0* 3.4*  CL 98  --  95* 94* 97* 95*  CO2 26  --  28 23 24 25   GLUCOSE 136*  --  135* 110* 109* 118*  BUN 25*  --  28* 36* 27* 39*  CREATININE 4.91*  --  5.49* 6.67* 5.17* 6.20*  CALCIUM 7.6*  --  6.8* 6.5* 6.3* 6.7*  MG  --   --  1.7 1.8 1.7 2.0  PHOS  --  4.8*  --   --   --   --    Liver Function Tests: Recent Labs  Lab 09/07/23 1936 09/08/23 0500  AST 79* 66*  ALT 49* 40  ALKPHOS 71  54  BILITOT 0.9 1.0  PROT 7.2 6.2*  ALBUMIN 1.7* 1.8*   No results for input(s): "LIPASE", "AMYLASE" in the last 168 hours. No results for input(s): "AMMONIA" in the last 168 hours. Cardiac Enzymes: No results for input(s): "CKTOTAL", "CKMB", "CKMBINDEX", "TROPONINI" in the last 168 hours. BNP (last 3 results) Recent Labs    02/05/23 1408 09/07/23 1936  BNP 553.7* 484.4*    ProBNP (last 3 results) No results for input(s): "PROBNP" in the last 8760 hours.  CBG: Recent Labs  Lab 09/12/23 1621 09/12/23 2143 09/13/23 0620 09/13/23 0849 09/13/23 1102  GLUCAP 150* 111* 123* 192* 163*   Recent Results (from the past 240 hours)  Culture, blood (Routine x 2)     Status: None   Collection Time: 09/07/23  3:52 PM   Specimen: BLOOD  Result Value Ref Range Status   Specimen Description BLOOD SITE NOT SPECIFIED  Final   Special Requests   Final    BOTTLES DRAWN AEROBIC AND ANAEROBIC Blood Culture results may not be optimal due to an inadequate volume of blood received in culture bottles   Culture   Final    NO GROWTH 5 DAYS Performed at [facility], [address] [place], [place] [address]    Report Status 09/12/2023 FINAL  Final  Culture, blood (Routine x 2)     Status: None    Collection Time: 09/07/23  7:36 PM   Specimen: BLOOD  Result Value Ref Range Status   Specimen Description BLOOD SITE NOT SPECIFIED  Final   Special Requests   Final    BOTTLES DRAWN AEROBIC AND ANAEROBIC Blood Culture results may not be optimal due to an inadequate volume of blood received in culture bottles   Culture   Final    NO GROWTH 5 DAYS Performed at [facility], [address], [place], [place] [address]    Report Status 09/12/2023 FINAL  Final  Resp panel by RT-PCR (RSV, Flu A&B, Covid) Peripheral     Status: None   Collection Time: 09/07/23  7:36 PM   Specimen: Peripheral; Nasal Swab  Result Value Ref Range Status   SARS Coronavirus 2 by RT PCR NEGATIVE NEGATIVE Final   Influenza A by PCR NEGATIVE NEGATIVE Final   Influenza B by PCR NEGATIVE NEGATIVE Final    Comment: (NOTE) The Xpert Xpress SARS-CoV-2/FLU/RSV plus assay is intended as an aid in the diagnosis of influenza from Nasopharyngeal swab specimens and should not be used as a sole basis for treatment. Nasal washings and aspirates are unacceptable for Xpert Xpress SARS-CoV-2/FLU/RSV testing.  Fact Sheet for Patients: BloggerCourse.com  Fact Sheet for Healthcare Providers: SeriousBroker.it  This test is not yet approved or cleared by the [place] FDA and has been authorized for detection and/or diagnosis of SARS-CoV-2 by FDA under an Emergency Use Authorization (EUA). This EUA will remain in effect (meaning this test can be used) for the duration of the COVID-19 declaration under Section 564(b)(1) of the Act, 21 U.S.C. section 360bbb-3(b)(1), unless the authorization is terminated or revoked.     Resp Syncytial Virus by PCR NEGATIVE NEGATIVE Final    Comment: (NOTE) Fact Sheet for Patients: BloggerCourse.com  Fact Sheet for Healthcare Providers: SeriousBroker.it  This test is not yet  approved or cleared by the [place] FDA and has been authorized for detection and/or diagnosis of SARS-CoV-2 by FDA under an Emergency Use Authorization (EUA). This EUA will remain in effect (meaning this test can be  used) for the duration of the COVID-19 declaration under Section 564(b)(1) of the Act, 21 U.S.C. section 360bbb-3(b)(1), unless the authorization is terminated or revoked.  Performed at [facility], [address] [place], [place] [address]      Studies: No results found.     Joycelyn Das, MD  [facility] 09/13/2023  If 7PM-7AM, please contact night-coverage

## 2023-09-14 DIAGNOSIS — I96 Gangrene, not elsewhere classified: Secondary | ICD-10-CM | POA: Diagnosis not present

## 2023-09-14 LAB — GLUCOSE, CAPILLARY
Glucose-Capillary: 117 mg/dL — ABNORMAL HIGH (ref 70–99)
Glucose-Capillary: 179 mg/dL — ABNORMAL HIGH (ref 70–99)
Glucose-Capillary: 187 mg/dL — ABNORMAL HIGH (ref 70–99)
Glucose-Capillary: 168 mg/dL — ABNORMAL HIGH (ref 70–99)

## 2023-09-14 NOTE — Progress Notes (Signed)
 Pt refused to let RN perform wound care on left leg blister.

## 2023-09-14 NOTE — Progress Notes (Signed)
 PROGRESS NOTE  [name] [name] NWG:[id number] DOB: 05-27-1956 DOA: 09/07/2023 PCP: [name], PA-C   LOS: 7 days   Brief narrative:   [name] [name] is a 68 y.o. male with medical history significant of end-stage renal disease on hemodialysis Monday /Friday, anemia, hypertension, history of coronary artery disease status post CABG, type 2 diabetes, congestive heart failure with ejection fraction of 30%, obstructive sleep apnea on CPAP, paroxysmal atrial fibrillation, history of severe peripheral vascular disease with critical limb ischemia status post atherectomy and stenting of left SFA and popliteal artery on 12/29/2022 by Dr. Randie Heinz subsequently followed by left BKA on 01/26/2023.  Patient also had right heel ulcer leading to right common femoral to below-knee popliteal artery bypass on  03/19/2023. Since then patient has had difficulty with healing of both his left BKA wound as well as non-healing ulcer on right heel with foul-smelling discharge from the right foot.  Patient then presented to hospital.  In the ED, patient was seen by vascular surgery and was noted to have severe necrotic changes to the right lower extremities and nonhealing below-knee amputation on the left.  Patient was also febrile and slightly hypotensive and lactic acid was elevated at 2.5.  Chest x-ray showed masslike opacity in the suprahilar region of the left.  Right foot x-ray showed diffuse soft tissue edema and extensive subcu gas with destructive changes of the first metatarsal joint likely osteomyelitis and septic arthritis.  Patient was given cefepime and Zyvox and was considered for admission to the hospital for further evaluation and treatment.   Assessment/Plan: Principal Problem:   Gangrene of lower extremity (HCC) Active Problems:   Mixed hyperlipidemia   Paroxysmal atrial fibrillation (HCC)   Chronic diastolic CHF (congestive heart failure) (HCC)   S/P CABG x 3   End stage renal disease (HCC)     Nonhealing right lower extremity ulcer with progressive gangrene  Patient with history of severe PVD with multiple intervention in the past.    MRI of the right ankle and foot with osteomyelitis and soft tissue ulceration.  Patient has undergone right above-knee amputation on 09/08/2023 by vascular surgery.  Received 48 hours of antibiotic postop.  WBC at 17.1and has continued to downtrend from 21K.[name]  No fever.    Blood cultures negative in 5 days.  Nonhealing left below-knee amputation site  Patient underwent left above-knee amputation on 09/08/2023.  Will need to follow-up with vascular surgery as outpatient.  [facility] Nephrology consulted for hemodialysis.   Continue midodrine.    Mild hypokalemia.  Potassium of at 3.4.  Will replenish orally.   Acute blood loss anemia on anemia of chronic kidney disease.  Latest Hemoglobin at 7.3  Has remained stable. Received 1 unit of packed RBC during hospitalization.  Continue Eliquis.  History of hypotension  Blood pressure better after resuming midodrine.    CAD s/p CABG No active issues at this time.  Continue Lipitor   Diabetes mellitus type 2. Continue sliding scale insulin, on regular diet at this time.  Glycemic control is adequate.  Patient is on Trulicity and Lantus 20 units at home.  Latest POC glucose of 163  Chronic systolic heart failure. Continue hemodialysis for fluid management.  Appears to be compensated at this time.   OSA Continue CPAP at bedtime.  Continue inhalers.   Paroxysmal atrial fibrillation. Resume albuterol.  Has been started on Eliquis.   COPD -continue on breo and bronchodilators.  Appears compensated.  Pressure injury coccyx stage III and left  buttocks stage II.  Present on admission.  Continue wound care. Pressure Injury 09/12/23 Coccyx Mid Stage 3 -  Full thickness tissue loss. Subcutaneous fat may be visible but bone, tendon or muscle are NOT exposed. appears as a north/south incision. surrounding tissues has  MASD (Active)  09/12/23 0500  Location: Coccyx  Location Orientation: Mid  Staging: Stage 3 -  Full thickness tissue loss. Subcutaneous fat may be visible but bone, tendon or muscle are NOT exposed.  Wound Description (Comments): appears as a north/south incision. surrounding tissues has MASD  Present on Admission: -- (Unknown)     Pressure Injury 09/13/23 Buttocks Left Stage 2 -  Partial thickness loss of dermis presenting as a shallow open injury with a red, pink wound bed without slough. (Active)  09/13/23 0800  Location: Buttocks  Location Orientation: Left  Staging: Stage 2 -  Partial thickness loss of dermis presenting as a shallow open injury with a red, pink wound bed without slough.  Wound Description (Comments):   Present on Admission:       Class I obesity.  Body mass index is 30.87 kg/m. Would benefit from ongoing weight loss as outpatient.  DVT prophylaxis: Will resume Eliquis. apixaban (ELIQUIS) tablet 5 mg   Disposition: .  PT, OT evaluation recommended skilled nursing facility.  Medically stable for disposition.  Status is: Inpatient  Remains inpatient appropriate because: Status post bilateral above-knee amputation, need for skilled nursing facility placement.    Code Status:     Code Status: Full Code  Family Communication: Spoke with the patient's son at bedside on 09/12/2023  Consultants: Vascular surgery Nephrology  Procedures: Bilateral above-knee amputation on 09/08/2023  Anti-infectives:  None  Subjective: Today, patient was seen and examined at bedside.  Complains of feeling okay.  Denies any nausea vomiting fever chills or rigor.   Objective: Vitals:   09/14/23 0752 09/14/23 0828  BP:  (!) 137/23  Pulse:  62  Resp:  17  Temp:  97.6 F (36.4 C)  SpO2: 98% 99%    Intake/Output Summary (Last 24 hours) at 09/14/2023 0916 Last data filed at 09/14/2023 0033 Gross per 24 hour  Intake 600 ml  Output --  Net 600 ml    Filed Weights    09/07/23 1542 09/08/23 1008 09/11/23 1323  Weight: 102.1 kg 103.4 kg 97.6 kg   Body mass index is 30.87 kg/m.   Physical Exam:  GENERAL: Patient is alert awake and oriented. Not in obvious distress.  Obese built  HENT: Pallor +  Pupils equally reactive to light. Oral mucosa is moist NECK: is supple, no gross swelling noted. CHEST: Clear to auscultation. No crackles or wheezes.   CVS: S1 and S2 heard, no murmur. Regular rate and rhythm.  ABDOMEN: Soft, non-tender, bowel sounds are present. EXTREMITIES: Bilateral above-knee amputation with staples in place.  Dressing in place. CNS: Cranial nerves are intact. SKIN: warm and dry, bilateral lower knee amputation with staples in place.  Data Review: I have personally reviewed the following laboratory data and studies,  CBC: Recent Labs  Lab 09/07/23 1936 09/08/23 0500 09/09/23 0709 09/09/23 2033 09/10/23 0810 09/11/23 0834 09/12/23 0304 09/13/23 0301  WBC 27.9*   < > 20.3*  --  21.9* 21.2* 19.1* 17.1*  NEUTROABS 25.9*  --   --   --   --   --   --   --   HGB 8.0*   < > 6.7* 8.1* 7.9* 7.3* 7.5* 7.3*  HCT 26.4*   < >  21.2* 25.5* 25.4* 23.3* 23.0* 22.9*  MCV 81.0   < > 84.1  --  87.0 86.0 83.9 84.5  PLT 550*   < > 344  --  370 371 404* 443*   < > = values in this interval not displayed.   Basic Metabolic Panel: Recent Labs  Lab 09/08/23 2113 09/09/23 0706 09/09/23 0709 09/10/23 0810 09/12/23 0304 09/13/23 0301  NA 136  --  133* 132* 131* 131*  K 3.7  --  3.3* 3.8 3.0* 3.4*  CL 98  --  95* 94* 97* 95*  CO2 26  --  28 23 24 25   GLUCOSE 136*  --  135* 110* 109* 118*  BUN 25*  --  28* 36* 27* 39*  CREATININE 4.91*  --  5.49* 6.67* 5.17* 6.20*  CALCIUM 7.6*  --  6.8* 6.5* 6.3* 6.7*  MG  --   --  1.7 1.8 1.7 2.0  PHOS  --  4.8*  --   --   --   --    Liver Function Tests: Recent Labs  Lab 09/07/23 1936 09/08/23 0500  AST 79* 66*  ALT 49* 40  ALKPHOS 71 54  BILITOT 0.9 1.0  PROT 7.2 6.2*  ALBUMIN 1.7* 1.8*   No  results for input(s): "LIPASE", "AMYLASE" in the last 168 hours. No results for input(s): "AMMONIA" in the last 168 hours. Cardiac Enzymes: No results for input(s): "CKTOTAL", "CKMB", "CKMBINDEX", "TROPONINI" in the last 168 hours. BNP (last 3 results) Recent Labs    02/05/23 1408 09/07/23 1936  BNP 553.7* 484.4*    ProBNP (last 3 results) No results for input(s): "PROBNP" in the last 8760 hours.  CBG: Recent Labs  Lab 09/13/23 0849 09/13/23 1102 09/13/23 1602 09/13/23 2113 09/14/23 0604  GLUCAP 192* 163* 167* 154* 117*   Recent Results (from the past 240 hours)  Culture, blood (Routine x 2)     Status: None   Collection Time: 09/07/23  3:52 PM   Specimen: BLOOD  Result Value Ref Range Status   Specimen Description BLOOD SITE NOT SPECIFIED  Final   Special Requests   Final    BOTTLES DRAWN AEROBIC AND ANAEROBIC Blood Culture results may not be optimal due to an inadequate volume of blood received in culture bottles   Culture   Final    NO GROWTH 5 DAYS Performed at [facility], [address] [place], [place] [address]    Report Status 09/12/2023 FINAL  Final  Culture, blood (Routine x 2)     Status: None   Collection Time: 09/07/23  7:36 PM   Specimen: BLOOD  Result Value Ref Range Status   Specimen Description BLOOD SITE NOT SPECIFIED  Final   Special Requests   Final    BOTTLES DRAWN AEROBIC AND ANAEROBIC Blood Culture results may not be optimal due to an inadequate volume of blood received in culture bottles   Culture   Final    NO GROWTH 5 DAYS Performed at [facility], [address] [place], [place] [address]    Report Status 09/12/2023 FINAL  Final  Resp panel by RT-PCR (RSV, Flu A&B, Covid) Peripheral     Status: None   Collection Time: 09/07/23  7:36 PM   Specimen: Peripheral; Nasal Swab  Result Value Ref Range Status   SARS Coronavirus 2 by RT PCR NEGATIVE NEGATIVE Final   Influenza A by PCR NEGATIVE NEGATIVE Final   Influenza B by  PCR NEGATIVE  NEGATIVE Final    Comment: (NOTE) The Xpert Xpress SARS-CoV-2/FLU/RSV plus assay is intended as an aid in the diagnosis of influenza from Nasopharyngeal swab specimens and should not be used as a sole basis for treatment. Nasal washings and aspirates are unacceptable for Xpert Xpress SARS-CoV-2/FLU/RSV testing.  Fact Sheet for Patients: BloggerCourse.com  Fact Sheet for Healthcare Providers: SeriousBroker.it  This test is not yet approved or cleared by the [place] FDA and has been authorized for detection and/or diagnosis of SARS-CoV-2 by FDA under an Emergency Use Authorization (EUA). This EUA will remain in effect (meaning this test can be used) for the duration of the COVID-19 declaration under Section 564(b)(1) of the Act, 21 U.S.C. section 360bbb-3(b)(1), unless the authorization is terminated or revoked.     Resp Syncytial Virus by PCR NEGATIVE NEGATIVE Final    Comment: (NOTE) Fact Sheet for Patients: BloggerCourse.com  Fact Sheet for Healthcare Providers: SeriousBroker.it  This test is not yet approved or cleared by the [place] FDA and has been authorized for detection and/or diagnosis of SARS-CoV-2 by FDA under an Emergency Use Authorization (EUA). This EUA will remain in effect (meaning this test can be used) for the duration of the COVID-19 declaration under Section 564(b)(1) of the Act, 21 U.S.C. section 360bbb-3(b)(1), unless the authorization is terminated or revoked.  Performed at [facility], [address] [place], [place] [address]      Studies: No results found.     Joycelyn Das, MD  [facility] 09/14/2023  If 7PM-7AM, please contact night-coverage

## 2023-09-14 NOTE — Progress Notes (Signed)
 Patient ID: [name] [name], male   DOB: 1956-05-04, 68 y.o.   MRN: [id number] S: No new complaints O:BP (!) 137/23 (BP Location: Left Arm)   Pulse 62   Temp 97.6 F (36.4 C) (Oral)   Resp 17   Ht 5\' 10"  (1.778 m)   Wt 97.6 kg   SpO2 99%   BMI 30.87 kg/m   Intake/Output Summary (Last 24 hours) at 09/14/2023 0902 Last data filed at 09/14/2023 0033 Gross per 24 hour  Intake 600 ml  Output --  Net 600 ml   Intake/Output: I/O last 3 completed shifts: In: 600 [P.O.:600] Out: -   Intake/Output this shift:  No intake/output data recorded. Weight change:  Gen: NAD CVS: RRR Resp:CTA Abd: +BS, soft, NT/ND Ext: no edema, s/p bilateral AKA's, RUE AVF +T/B  Recent Labs  Lab 09/07/23 1936 09/08/23 0500 09/08/23 1203 09/08/23 1317 09/08/23 2113 09/09/23 0706 09/09/23 0709 09/10/23 0810 09/12/23 0304 09/13/23 0301  NA 139 138 138 138 136  --  133* 132* 131* 131*  K 2.5* 2.9* 3.7 3.5 3.7  --  3.3* 3.8 3.0* 3.4*  CL 96* 97*  --   --  98  --  95* 94* 97* 95*  CO2 30 31  --   --  26  --  28 23 24 25   GLUCOSE 125* 102*  --   --  136*  --  135* 110* 109* 118*  BUN 16 19  --   --  25*  --  28* 36* 27* 39*  CREATININE 3.52* 4.16*  --   --  4.91*  --  5.49* 6.67* 5.17* 6.20*  ALBUMIN 1.7* 1.8*  --   --   --   --   --   --   --   --   CALCIUM 7.6* 7.4*  --   --  7.6*  --  6.8* 6.5* 6.3* 6.7*  PHOS  --   --   --   --   --  4.8*  --   --   --   --   AST 79* 66*  --   --   --   --   --   --   --   --   ALT 49* 40  --   --   --   --   --   --   --   --    Liver Function Tests: Recent Labs  Lab 09/07/23 1936 09/08/23 0500  AST 79* 66*  ALT 49* 40  ALKPHOS 71 54  BILITOT 0.9 1.0  PROT 7.2 6.2*  ALBUMIN 1.7* 1.8*   No results for input(s): "LIPASE", "AMYLASE" in the last 168 hours. No results for input(s): "AMMONIA" in the last 168 hours. CBC: Recent Labs  Lab 09/07/23 1936 09/08/23 0500 09/09/23 0709 09/09/23 2033 09/10/23 0810 09/11/23 0834 09/12/23 0304  09/13/23 0301  WBC 27.9*   < > 20.3*  --  21.9* 21.2* 19.1* 17.1*  NEUTROABS 25.9*  --   --   --   --   --   --   --   HGB 8.0*   < > 6.7*   < > 7.9* 7.3* 7.5* 7.3*  HCT 26.4*   < > 21.2*   < > 25.4* 23.3* 23.0* 22.9*  MCV 81.0   < > 84.1  --  87.0 86.0 83.9 84.5  PLT 550*   < > 344  --  370 371 404* 443*   < > =  values in this interval not displayed.   Cardiac Enzymes: No results for input(s): "CKTOTAL", "CKMB", "CKMBINDEX", "TROPONINI" in the last 168 hours. CBG: Recent Labs  Lab 09/13/23 0849 09/13/23 1102 09/13/23 1602 09/13/23 2113 09/14/23 0604  GLUCAP 192* 163* 167* 154* 117*    Iron Studies: No results for input(s): "IRON", "TIBC", "TRANSFERRIN", "FERRITIN" in the last 72 hours. Studies/Results: No results found.  amiodarone  200 mg Oral BID   apixaban  5 mg Oral BID   atorvastatin  80 mg Oral Daily   Chlorhexidine Gluconate Cloth  6 each Topical Q0600   Chlorhexidine Gluconate Cloth  6 each Topical Q0600   doxercalciferol  1 mcg Intravenous Once per day on Monday Friday   fluticasone furoate-vilanterol  2 puff Inhalation Daily   insulin aspart  0-9 Units Subcutaneous TID WC   midodrine  5 mg Oral Daily   montelukast  10 mg Oral Daily   multivitamin  1 tablet Oral Daily    BMET    Component Value Date/Time   NA 131 (L) 09/13/2023 0301   NA 139 04/17/2022 1452   K 3.4 (L) 09/13/2023 0301   CL 95 (L) 09/13/2023 0301   CO2 25 09/13/2023 0301   GLUCOSE 118 (H) 09/13/2023 0301   BUN 39 (H) 09/13/2023 0301   BUN 42 (H) 04/17/2022 1452   CREATININE 6.20 (H) 09/13/2023 0301   CALCIUM 6.7 (L) 09/13/2023 0301   GFRNONAA 9 (L) 09/13/2023 0301   GFRAA 42 (L) 10/18/2019 1006   CBC    Component Value Date/Time   WBC 17.1 (H) 09/13/2023 0301   RBC 2.71 (L) 09/13/2023 0301   HGB 7.3 (L) 09/13/2023 0301   HGB 7.6 (L) 04/17/2022 1452   HCT 22.9 (L) 09/13/2023 0301   HCT 25.3 (L) 04/17/2022 1452   PLT 443 (H) 09/13/2023 0301   PLT 351 04/17/2022 1452   MCV 84.5  09/13/2023 0301   MCV 88 04/17/2022 1452   MCH 26.9 09/13/2023 0301   MCHC 31.9 09/13/2023 0301   RDW 19.6 (H) 09/13/2023 0301   RDW 15.5 (H) 04/17/2022 1452   LYMPHSABS 0.0 (L) 09/07/2023 1936   LYMPHSABS 1.4 02/18/2022 1117   MONOABS 1.7 (H) 09/07/2023 1936   EOSABS 0.0 09/07/2023 1936   EOSABS 0.3 02/18/2022 1117   BASOSABS 0.3 (H) 09/07/2023 1936   BASOSABS 0.1 02/18/2022 1117    OP HD: [facility] MWF (pt says it is M-F)  3h  B400   101kg  2K bath   RUA AVF  Heparin none - hectorol 1 mcg  - mircera 225 mcg q 2 wks, last 3/07       Assessment/ Plan: Gangrene right lower leg: Progressive issue, also nonhealing left BKA site.  Patient underwent bilateral AKA on 09/08/2023.  Per [facility]. ESRD: on HD 2x per week (M-F).  Next HD today.  Chronic hypotension: On midodrine at home, continue here Volume: 3.5 kg down by wts, which should be the case after sig amputation. 3 L off Friday. BP's soft now, lower UF to 1-2 L next HD.  Anemia of esrd:.  Hemoglobin 6.7-8.5 here, has received PRBCs here. Secondary hyperparathyroidism: Corrected calcium and phos are in range.  PAF Chronic systolic HF  Irena Cords, MD [facility]

## 2023-09-14 NOTE — Progress Notes (Signed)
 PT Cancellation Note  Patient Details Name: [name] [name] MRN: [id number] DOB: 1956/04/22   Cancelled Treatment:    Reason Eval/Treat Not Completed: (P) Patient declined, no reason specified, pt declining all mobility despite max encouragement and multiple options for session (EOB, bed level, etc) asking this PTA to return tomorrow. Will check back as schedule allows to continue with PT POC.  [name]. PTA Acute Rehabilitation Services Office: [phone]    [name] 09/14/2023, 11:30 AM

## 2023-09-14 NOTE — Progress Notes (Addendum)
  Progress Note    09/14/2023 8:51 AM 6 Days Post-Op  Subjective:  resting, says his pain at the amputation sites is slightly better   Vitals:   09/14/23 0752 09/14/23 0828  BP:  (!) 137/23  Pulse:  62  Resp:  17  Temp:  97.6 F (36.4 C)  SpO2: 98% 99%    Physical Exam: General: Sitting up in bed, awake and alert Lungs: Nonlabored Incisions: Bilateral AKA incisions healing appropriately.  Right AKA is dry.  Left AKA with mild serosanguineous drainage Extremities: Small, intact blister on the proximal right AKA.  2 areas of intact blistering on the proximal left AKA, located medially and laterally  CBC    Component Value Date/Time   WBC 17.1 (H) 09/13/2023 0301   RBC 2.71 (L) 09/13/2023 0301   HGB 7.3 (L) 09/13/2023 0301   HGB 7.6 (L) 04/17/2022 1452   HCT 22.9 (L) 09/13/2023 0301   HCT 25.3 (L) 04/17/2022 1452   PLT 443 (H) 09/13/2023 0301   PLT 351 04/17/2022 1452   MCV 84.5 09/13/2023 0301   MCV 88 04/17/2022 1452   MCH 26.9 09/13/2023 0301   MCHC 31.9 09/13/2023 0301   RDW 19.6 (H) 09/13/2023 0301   RDW 15.5 (H) 04/17/2022 1452   LYMPHSABS 0.0 (L) 09/07/2023 1936   LYMPHSABS 1.4 02/18/2022 1117   MONOABS 1.7 (H) 09/07/2023 1936   EOSABS 0.0 09/07/2023 1936   EOSABS 0.3 02/18/2022 1117   BASOSABS 0.3 (H) 09/07/2023 1936   BASOSABS 0.1 02/18/2022 1117    BMET    Component Value Date/Time   NA 131 (L) 09/13/2023 0301   NA 139 04/17/2022 1452   K 3.4 (L) 09/13/2023 0301   CL 95 (L) 09/13/2023 0301   CO2 25 09/13/2023 0301   GLUCOSE 118 (H) 09/13/2023 0301   BUN 39 (H) 09/13/2023 0301   BUN 42 (H) 04/17/2022 1452   CREATININE 6.20 (H) 09/13/2023 0301   CALCIUM 6.7 (L) 09/13/2023 0301   GFRNONAA 9 (L) 09/13/2023 0301   GFRAA 42 (L) 10/18/2019 1006    INR    Component Value Date/Time   INR 3.3 (H) 09/08/2023 0500     Intake/Output Summary (Last 24 hours) at 09/14/2023 0851 Last data filed at 09/14/2023 0033 Gross per 24 hour  Intake 600 ml   Output --  Net 600 ml      Assessment/Plan:  68 y.o. male is 6 days postop, s/p: Bilateral AKAs   -Patient's pain at amputation sites is slowly improving, continue pain meds as needed -Bilateral AKA incisions are healing appropriately without erythema.  Right AKA incisions dry.  Left AKA incision with mild serosanguineous drainage, continue dry dressing changes as needed -Patient has mild blistering of bilateral, proximal AKA stumps.  This may be due to tape irritation.  Recommend keeping these areas clean and dry.  Recommend dressing changes without tape, okay to use gauze and Ace wrap for secure bandaging -Stable for discharge from vascular perspective.  Will arrange follow-up with our office in 4 weeks for staple removal   Loel Dubonnet, PA-C [facility] [phone] 09/14/2023 8:51 AM   I have independently interviewed patient and agree with PA assessment and plan above.   Tametra Ahart C. Randie Heinz, MD [facility] [place] Office: [phone] Pager: [phone]

## 2023-09-14 NOTE — Consult Note (Signed)
 WOC Nurse Consult Note: Reason for Consult:Bilateral AKA (s/p revision) with staple line to both sites.  Linear vesicles to left lateral thigh consistent with medical adhesive related skin injury Stage 3 pressure injury to sacrum with surrounding incontinence associated skin damage.  Stool noted to skin upon my arrival and staff in for incontinence care.    Wound type:  pressure and incontinence Pressure Injury POA: Yes Measurement:Left buttock 1 cmx 1 cm x 0.3 cm joins to sacrum 3 cm x 3 cm x 0.1 cm  Wound bed: red and moist  Drainage (amount, consistency, odor) minimal serosanguinous  no odor Periwound: frequently moist from incontinence Obese body habitus and recent bilateral AKA impair bed mobility, increased risk of prolonged pressure.  Dressing procedure/placement/frequency: Cleanse sacral wound with soap and water and pat dry. Apply VASHE moist gauze to nonintact skin and cover with silicone foam dressing.  Change daily and PRN soilage.  Will not follow at this time.  Please re-consult if needed.  Mike Gip MSN, RN, FNP-BC [facility]ON Wound, Ostomy, Continence Nurse [facility] [phone] Pager [phone]

## 2023-09-15 DIAGNOSIS — I96 Gangrene, not elsewhere classified: Secondary | ICD-10-CM | POA: Diagnosis not present

## 2023-09-15 LAB — GLUCOSE, CAPILLARY
Glucose-Capillary: 116 mg/dL — ABNORMAL HIGH (ref 70–99)
Glucose-Capillary: 236 mg/dL — ABNORMAL HIGH (ref 70–99)
Glucose-Capillary: 213 mg/dL — ABNORMAL HIGH (ref 70–99)

## 2023-09-15 MED ORDER — DARBEPOETIN ALFA 200 MCG/0.4ML IJ SOSY
200.0000 ug | PREFILLED_SYRINGE | INTRAMUSCULAR | Status: DC
  Administered 2023-09-15: 200 ug via SUBCUTANEOUS
  Filled 2023-09-15: qty 0.4

## 2023-09-15 MED ORDER — HYDROMORPHONE HCL 1 MG/ML IJ SOLN
0.5000 mg | INTRAMUSCULAR | Status: DC | PRN
  Administered 2023-09-17 – 2023-09-19 (×4): 0.5 mg via INTRAVENOUS
  Filled 2023-09-15 (×4): qty 0.5

## 2023-09-15 NOTE — Progress Notes (Signed)
   09/15/23 1134  Vitals  Temp 97.9 F (36.6 C)  Pulse Rate 72  Resp 19  BP 132/79  SpO2 99 %  O2 Device Room Air  Weight  (unable to obtain)  Type of Weight Post-Dialysis  Oxygen Therapy  Patient Activity (if Appropriate) In bed  Pulse Oximetry Type Continuous  Post Treatment  Dialyzer Clearance Clear  Liters Processed 69.1  Fluid Removed (mL) 2000 mL  Tolerated HD Treatment Yes  AVG/AVF Arterial Site Held (minutes) 6 minutes  AVG/AVF Venous Site Held (minutes) 6 minutes   Received patient in bed to unit.  Alert and oriented.  Informed consent signed and in chart.   TX duration:3 HRS  Patient tolerated well.  Transported back to the room  Alert, without acute distress.  Hand-off given to patient's nurse.   Access used: RUA AVF Access issues: NONE  Total UF removed: 2L Medication(s) given: NONE   [name]

## 2023-09-15 NOTE — Progress Notes (Signed)
 Pt is supposed to receive out-pt HD at [facility] on MWF 10:15 am chair time. Per clinic staff, pt only comes to treatments on Mondays and Fridays by his choice. Will assist as needed.   Olivia Canter Renal Navigator [phone]

## 2023-09-15 NOTE — Procedures (Signed)
 I was present at this dialysis session. I have reviewed the session itself and made appropriate changes.   Vital signs in last 24 hours:  Temp:  [97.6 F (36.4 C)-97.9 F (36.6 C)] 97.8 F (36.6 C) (03/25 0805) Pulse Rate:  [59-71] 60 (03/25 0815) Resp:  [16-20] 16 (03/25 0815) BP: (99-143)/(23-59) 143/52 (03/25 0815) SpO2:  [95 %-100 %] 98 % (03/25 0815) Weight change:  Filed Weights   09/07/23 1542 09/08/23 1008 09/11/23 1323  Weight: 102.1 kg 103.4 kg 97.6 kg    Recent Labs  Lab 09/09/23 0706 09/09/23 0709 09/13/23 0301  NA  --    < > 131*  K  --    < > 3.4*  CL  --    < > 95*  CO2  --    < > 25  GLUCOSE  --    < > 118*  BUN  --    < > 39*  CREATININE  --    < > 6.20*  CALCIUM  --    < > 6.7*  PHOS 4.8*  --   --    < > = values in this interval not displayed.    Recent Labs  Lab 09/11/23 0834 09/12/23 0304 09/13/23 0301  WBC 21.2* 19.1* 17.1*  HGB 7.3* 7.5* 7.3*  HCT 23.3* 23.0* 22.9*  MCV 86.0 83.9 84.5  PLT 371 404* 443*    Scheduled Meds:  amiodarone  200 mg Oral BID   apixaban  5 mg Oral BID   atorvastatin  80 mg Oral Daily   Chlorhexidine Gluconate Cloth  6 each Topical Q0600   Chlorhexidine Gluconate Cloth  6 each Topical Q0600   doxercalciferol  1 mcg Intravenous Once per day on Monday Friday   fluticasone furoate-vilanterol  2 puff Inhalation Daily   insulin aspart  0-9 Units Subcutaneous TID WC   midodrine  5 mg Oral Daily   montelukast  10 mg Oral Daily   multivitamin  1 tablet Oral Daily   Continuous Infusions: PRN Meds:.acetaminophen **OR** acetaminophen, albuterol, HYDROcodone-acetaminophen, HYDROmorphone (DILAUDID) injection, ondansetron **OR** ondansetron (ZOFRAN) IV   Irena Cords,  MD 09/15/2023, 8:27 AM

## 2023-09-15 NOTE — Plan of Care (Signed)
  Problem: Health Behavior/Discharge Planning: Goal: Ability to manage health-related needs will improve Outcome: Not Progressing   Problem: Nutritional: Goal: Progress toward achieving an optimal weight will improve Outcome: Not Progressing

## 2023-09-15 NOTE — Progress Notes (Signed)
 PROGRESS NOTE  Rody Keadle Kneebone ZOX:[id number] DOB: 25-Sep-1955 DOA: 09/07/2023 PCP: [name], PA-C   LOS: 8 days   Brief narrative:   [name] [name] is a 68 y.o. male with medical history significant of end-stage renal disease on hemodialysis Monday  and Friday, anemia, hypertension, history of coronary artery disease status post CABG, type 2 diabetes, congestive heart failure with ejection fraction of 30%, obstructive sleep apnea on CPAP, paroxysmal atrial fibrillation, history of severe peripheral vascular disease with critical limb ischemia status post atherectomy and stenting of left SFA and popliteal artery on 12/29/2022 by Dr. Randie Heinz subsequently followed by left BKA on 01/26/2023, right heel ulcer leading to right common femoral to below-knee popliteal artery bypass on  03/19/2023 presented to hospital with ongoing difficulty with healing of his left BKA and. ulcer on right heel with foul-smelling discharge from the right lower extremity.  Patient then presented to hospital.  In the ED, patient was seen by vascular surgery and was noted to have severe necrotic changes to the right lower extremities and nonhealing below-knee amputation on the left.  Patient was also febrile and slightly hypotensive and lactic acid was elevated at 2.5.  Chest x-ray showed masslike opacity in the suprahilar region of the left.  Right foot x-ray showed diffuse soft tissue edema and extensive subcu gas with destructive changes of the first metatarsal joint likely osteomyelitis and septic arthritis.  Patient was given cefepime and Zyvox and was considered for admission to the hospital for further evaluation and treatment.   After admission to the hospital patient has undergone bilateral above-knee amputation, leukocytosis trending down.  Antibiotics have been stopped.  Currently awaiting for placement.  Medically stable for disposition  Assessment/Plan: Principal Problem:   Gangrene of lower extremity  (HCC) Active Problems:   Mixed hyperlipidemia   Paroxysmal atrial fibrillation (HCC)   Chronic diastolic CHF (congestive heart failure) (HCC)   S/P CABG x 3   End stage renal disease (HCC)    Nonhealing right lower extremity ulcer with progressive gangrene  Patient with history of severe PVD with multiple intervention in the past.    MRI of the right ankle and foot with osteomyelitis and soft tissue ulceration.  Patient has undergone right above-knee amputation on 09/08/2023 by vascular surgery.  Received 48 hours of antibiotic postop.  WBC at 17.1and has continued to downtrend from 21K.[name]  No fever.    Blood cultures negative in 5 days.  Nonhealing left below-knee amputation site  Patient underwent left above-knee amputation on 09/08/2023.  Will need to follow-up with vascular surgery as outpatient.  [facility] Nephrology consulted for hemodialysis.   Continue midodrine.    Mild hypokalemia.  Potassium of at 3.4.  Replenished.   Acute blood loss anemia on anemia of chronic kidney disease.  Latest Hemoglobin at 7.3  Has remained stable. Received 1 unit of packed RBC during hospitalization.  Continue Eliquis.  Check CBC in AM.  History of hypotension  Blood pressure better after resuming midodrine.    CAD s/p CABG No active issues at this time.  Continue Lipitor   Diabetes mellitus type 2. Continue sliding scale insulin, on regular diet at this time.  Glycemic control is adequate.  Patient is on Trulicity and Lantus 20 units at home.  Latest POC glucose of 116  Chronic systolic heart failure. Continue hemodialysis for fluid management.  Compensated.   OSA Continue CPAP at bedtime.  Continue inhalers.   Paroxysmal atrial fibrillation. Continue Eliquis and amiodarone  COPD -continue  bronchodilators.  Appears compensated.  Pressure injury coccyx stage III and left buttocks stage II.  Present on admission.  Continue wound care. Pressure Injury 09/12/23 Coccyx Mid Stage 3 -  Full  thickness tissue loss. Subcutaneous fat may be visible but bone, tendon or muscle are NOT exposed. appears as a north/south incision. surrounding tissues has MASD (Active)  09/12/23 0500  Location: Coccyx  Location Orientation: Mid  Staging: Stage 3 -  Full thickness tissue loss. Subcutaneous fat may be visible but bone, tendon or muscle are NOT exposed.  Wound Description (Comments): appears as a north/south incision. surrounding tissues has MASD  Present on Admission: -- (Unknown)     Pressure Injury 09/13/23 Buttocks Left Stage 2 -  Partial thickness loss of dermis presenting as a shallow open injury with a red, pink wound bed without slough. (Active)  09/13/23 0800  Location: Buttocks  Location Orientation: Left  Staging: Stage 2 -  Partial thickness loss of dermis presenting as a shallow open injury with a red, pink wound bed without slough.  Wound Description (Comments):   Present on Admission:      Class I obesity.  Body mass index is 30.87 kg/m. Would benefit from ongoing weight loss as outpatient.  DVT prophylaxis: Will resume Eliquis. apixaban (ELIQUIS) tablet 5 mg   Disposition: .  PT, OT evaluation has recommended skilled nursing facility.  Medically stable for disposition.  Status is: Inpatient  Remains inpatient appropriate because: Status post bilateral above-knee amputation, need for skilled nursing facility placement.    Code Status:     Code Status: Full Code  Family Communication: Spoke with the patient's son at bedside on 09/12/2023  Consultants: Vascular surgery Nephrology  Procedures: Bilateral above-knee amputation on 09/08/2023  Anti-infectives:  None  Subjective: Today, patient was seen and examined at bedside.  Seen during hemodialysis.  Patient denies any nausea, vomiting, fever, overt pain.    Objective: Vitals:   09/15/23 0900 09/15/23 0930  BP: (!) 136/52 (!) 138/50  Pulse: 65 63  Resp: 17 18  Temp:    SpO2: 96% 97%     Intake/Output Summary (Last 24 hours) at 09/15/2023 1000 Last data filed at 09/15/2023 0738 Gross per 24 hour  Intake 240 ml  Output --  Net 240 ml    Filed Weights   09/07/23 1542 09/08/23 1008 09/11/23 1323  Weight: 102.1 kg 103.4 kg 97.6 kg   Body mass index is 30.87 kg/m.   Physical Exam:  GENERAL: Patient is alert awake and oriented. Not in obvious distress.  Obese built  HENT: Mild pallor noted.  Pupils equally reactive to light. Oral mucosa is moist NECK: is supple, no gross swelling noted. CHEST: Clear to auscultation. No crackles or wheezes.   CVS: S1 and S2 heard, no murmur. Regular rate and rhythm.  ABDOMEN: Soft, non-tender, bowel sounds are present. EXTREMITIES: Bilateral above-knee amputation with staples in place.  Dressing in place. CNS: Cranial nerves are intact. SKIN: warm and dry, bilateral lower knee amputation  Data Review: I have personally reviewed the following laboratory data and studies,  CBC: Recent Labs  Lab 09/09/23 0709 09/09/23 2033 09/10/23 0810 09/11/23 0834 09/12/23 0304 09/13/23 0301  WBC 20.3*  --  21.9* 21.2* 19.1* 17.1*  HGB 6.7* 8.1* 7.9* 7.3* 7.5* 7.3*  HCT 21.2* 25.5* 25.4* 23.3* 23.0* 22.9*  MCV 84.1  --  87.0 86.0 83.9 84.5  PLT 344  --  370 371 404* 443*   Basic Metabolic Panel:  Recent Labs  Lab 09/08/23 2113 09/09/23 0706 09/09/23 0709 09/10/23 0810 09/12/23 0304 09/13/23 0301  NA 136  --  133* 132* 131* 131*  K 3.7  --  3.3* 3.8 3.0* 3.4*  CL 98  --  95* 94* 97* 95*  CO2 26  --  28 23 24 25   GLUCOSE 136*  --  135* 110* 109* 118*  BUN 25*  --  28* 36* 27* 39*  CREATININE 4.91*  --  5.49* 6.67* 5.17* 6.20*  CALCIUM 7.6*  --  6.8* 6.5* 6.3* 6.7*  MG  --   --  1.7 1.8 1.7 2.0  PHOS  --  4.8*  --   --   --   --    Liver Function Tests: No results for input(s): "AST", "ALT", "ALKPHOS", "BILITOT", "PROT", "ALBUMIN" in the last 168 hours.  No results for input(s): "LIPASE", "AMYLASE" in the last 168  hours. No results for input(s): "AMMONIA" in the last 168 hours. Cardiac Enzymes: No results for input(s): "CKTOTAL", "CKMB", "CKMBINDEX", "TROPONINI" in the last 168 hours. BNP (last 3 results) Recent Labs    02/05/23 1408 09/07/23 1936  BNP 553.7* 484.4*    ProBNP (last 3 results) No results for input(s): "PROBNP" in the last 8760 hours.  CBG: Recent Labs  Lab 09/14/23 0604 09/14/23 1143 09/14/23 1656 09/14/23 2100 09/15/23 0559  GLUCAP 117* 179* 187* 168* 116*   Recent Results (from the past 240 hours)  Culture, blood (Routine x 2)     Status: None   Collection Time: 09/07/23  3:52 PM   Specimen: BLOOD  Result Value Ref Range Status   Specimen Description BLOOD SITE NOT SPECIFIED  Final   Special Requests   Final    BOTTLES DRAWN AEROBIC AND ANAEROBIC Blood Culture results may not be optimal due to an inadequate volume of blood received in culture bottles   Culture   Final    NO GROWTH 5 DAYS Performed at [facility], [address], [place], [place] [address]    Report Status 09/12/2023 FINAL  Final  Culture, blood (Routine x 2)     Status: None   Collection Time: 09/07/23  7:36 PM   Specimen: BLOOD  Result Value Ref Range Status   Specimen Description BLOOD SITE NOT SPECIFIED  Final   Special Requests   Final    BOTTLES DRAWN AEROBIC AND ANAEROBIC Blood Culture results may not be optimal due to an inadequate volume of blood received in culture bottles   Culture   Final    NO GROWTH 5 DAYS Performed at [facility], [address] [place], [place] [address]    Report Status 09/12/2023 FINAL  Final  Resp panel by RT-PCR (RSV, Flu A&B, Covid) Peripheral     Status: None   Collection Time: 09/07/23  7:36 PM   Specimen: Peripheral; Nasal Swab  Result Value Ref Range Status   SARS Coronavirus 2 by RT PCR NEGATIVE NEGATIVE Final   Influenza A by PCR NEGATIVE NEGATIVE Final   Influenza B by PCR NEGATIVE NEGATIVE Final    Comment: (NOTE) The Xpert  Xpress SARS-CoV-2/FLU/RSV plus assay is intended as an aid in the diagnosis of influenza from Nasopharyngeal swab specimens and should not be used as a sole basis for treatment. Nasal washings and aspirates are unacceptable for Xpert Xpress SARS-CoV-2/FLU/RSV testing.  Fact Sheet for Patients: BloggerCourse.com  Fact Sheet for Healthcare Providers: SeriousBroker.it  This test is not yet approved or  cleared by the [place] and has been authorized for detection and/or diagnosis of SARS-CoV-2 by FDA under an Emergency Use Authorization (EUA). This EUA will remain in effect (meaning this test can be used) for the duration of the COVID-19 declaration under Section 564(b)(1) of the Act, 21 U.S.C. section 360bbb-3(b)(1), unless the authorization is terminated or revoked.     Resp Syncytial Virus by PCR NEGATIVE NEGATIVE Final    Comment: (NOTE) Fact Sheet for Patients: BloggerCourse.com  Fact Sheet for Healthcare Providers: SeriousBroker.it  This test is not yet approved or cleared by the [place] FDA and has been authorized for detection and/or diagnosis of SARS-CoV-2 by FDA under an Emergency Use Authorization (EUA). This EUA will remain in effect (meaning this test can be used) for the duration of the COVID-19 declaration under Section 564(b)(1) of the Act, 21 U.S.C. section 360bbb-3(b)(1), unless the authorization is terminated or revoked.  Performed at [facility], [address], [place], [place] [address]      Studies: No results found.     Joycelyn Das, MD  [facility] 09/15/2023  If 7PM-7AM, please contact night-coverage

## 2023-09-15 NOTE — Progress Notes (Signed)
 Physical Therapy Treatment Patient Details Name: [name] [name] MRN: [id number] DOB: [birth year] Today's Date: 09/15/2023   History of Present Illness 68 y.o. male presents to [facility] 09/07/23 w/ non healing ulcer on R heel and poor healing of L BKA. S/p L and R AKA on 3/18. Chest x-ray also showed mass like opacity on suprahilar region on L. PMHx: AKI with ESRD and HD, CHF, CAD s/p CABG x3,  DM, diabetic neuropathy, OSA, L BKA 01/26/23    PT Comments  Pt resting in bed on arrival, endorsing increased fatigue post HD, agreeable to bed level session with focus on LE exercises and HEP review for strength maintenance for improved bed mobility and transfers. Pt issued HEP and able to demonstrate and/or verbalize understanding of all exercises. Educated pt on importance of continued and frequent with pt verbalizing understanding. Pt continues to benefit from skilled PT services to progress toward functional mobility goals.     If plan is discharge home, recommend the following: A lot of help with walking and/or transfers;A lot of help with bathing/dressing/bathroom;Assistance with cooking/housework;Assist for transportation;Help with stairs or ramp for entrance   Can travel by private vehicle     No  Equipment Recommendations  None recommended by PT (Pt owns equipment)    Recommendations for Other Services       Precautions / Restrictions Precautions Precautions: Fall Restrictions Weight Bearing Restrictions Per Provider Order: Yes RLE Weight Bearing Per Provider Order: Non weight bearing LLE Weight Bearing Per Provider Order: Non weight bearing     Mobility  Bed Mobility Overal bed mobility: Needs Assistance             General bed mobility comments: session limited to bed level exercises per pt request    Transfers                        Ambulation/Gait                   Stairs             Wheelchair Mobility     Tilt Bed    Modified Rankin  (Stroke Patients Only)       Balance                                            Communication Communication Communication: No apparent difficulties  Cognition Arousal: Alert Behavior During Therapy: Flat affect   PT - Cognitive impairments: No apparent impairments                         Following commands: Intact      Cueing    Exercises Amputee Exercises Quad Sets: AROM, Both, 5 reps, Supine Gluteal Sets: AROM, 5 reps, Supine Hip ABduction/ADduction: AROM, Both, 10 reps, Supine Hip Flexion/Marching: AROM, Both, 10 reps, Supine    General Comments General comments (skin integrity, edema, etc.): HEP issued and reviewed: Access Code: [email or web address]  URL: [email or web address]  Date: 09/15/2023     Exercises  - Sidelying Hip Abduction (AKA)  - 1 x daily - 7 x weekly - 3 sets - 10 reps  - Sidelying Hip Circles (AKA)  - 1 x daily - 7 x weekly - 3 sets - 10 reps  - Supine Hip Abduction  - 1 x daily - 7 x weekly -  3 sets - 10 reps  - Supine Quad Set (BKA)  - 1 x daily - 7 x weekly - 3 sets - 10 reps  - Supine Gluteal Sets  - 1 x daily - 7 x weekly - 3 sets - 10 reps      Pertinent Vitals/Pain Pain Assessment Pain Assessment: Faces Faces Pain Scale: Hurts a little bit Pain Location: B residual limbs Pain Descriptors / Indicators: Sore Pain Intervention(s): Limited activity within patient's tolerance, Monitored during session    Home Living                          Prior Function            PT Goals (current goals can now be found in the care plan section) Acute Rehab PT Goals Patient Stated Goal: to get better PT Goal Formulation: With patient Time For Goal Achievement: 09/25/23 Progress towards PT goals: Not progressing toward goals - comment    Frequency    Min 2X/week      PT Plan      Co-evaluation              AM-PAC PT "6 Clicks" Mobility   Outcome Measure  Help needed turning from your back to  your side while in a flat bed without using bedrails?: Total Help needed moving from lying on your back to sitting on the side of a flat bed without using bedrails?: Total Help needed moving to and from a bed to a chair (including a wheelchair)?: Total Help needed standing up from a chair using your arms (e.g., wheelchair or bedside chair)?: Total Help needed to walk in hospital room?: Total Help needed climbing 3-5 steps with a railing? : Total 6 Click Score: 6    End of Session   Activity Tolerance: Patient limited by fatigue Patient left: in bed;with call bell/phone within reach Nurse Communication: Mobility status PT Visit Diagnosis: Other abnormalities of gait and mobility (R26.89);Muscle weakness (generalized) (M62.81)     Time: 1610-9604 PT Time Calculation (min) (ACUTE ONLY): 11 min  Charges:    $Therapeutic Exercise: 8-22 mins PT General Charges $$ ACUTE PT VISIT: 1 Visit                     Brinae Woods R. PTA Acute Rehabilitation Services Office: [phone]   [name] 09/15/2023, 4:22 PM

## 2023-09-15 NOTE — Plan of Care (Signed)
  Problem: Health Behavior/Discharge Planning: Goal: Ability to manage health-related needs will improve Outcome: Not Progressing   Problem: Skin Integrity: Goal: Risk for impaired skin integrity will decrease Outcome: Not Progressing

## 2023-09-16 DIAGNOSIS — M726 Necrotizing fasciitis: Secondary | ICD-10-CM

## 2023-09-16 DIAGNOSIS — A419 Sepsis, unspecified organism: Secondary | ICD-10-CM

## 2023-09-16 DIAGNOSIS — I96 Gangrene, not elsewhere classified: Secondary | ICD-10-CM | POA: Diagnosis not present

## 2023-09-16 DIAGNOSIS — E876 Hypokalemia: Secondary | ICD-10-CM | POA: Diagnosis not present

## 2023-09-16 LAB — HEMOGLOBIN AND HEMATOCRIT, BLOOD
HCT: 23.8 % — ABNORMAL LOW (ref 39.0–52.0)
Hemoglobin: 7.7 g/dL — ABNORMAL LOW (ref 13.0–17.0)

## 2023-09-16 LAB — GLUCOSE, CAPILLARY
Glucose-Capillary: 177 mg/dL — ABNORMAL HIGH (ref 70–99)
Glucose-Capillary: 233 mg/dL — ABNORMAL HIGH (ref 70–99)
Glucose-Capillary: 215 mg/dL — ABNORMAL HIGH (ref 70–99)
Glucose-Capillary: 201 mg/dL — ABNORMAL HIGH (ref 70–99)

## 2023-09-16 LAB — BASIC METABOLIC PANEL
Anion gap: 8 (ref 5–15)
BUN: 42 mg/dL — ABNORMAL HIGH (ref 8–23)
CO2: 26 mmol/L (ref 22–32)
Calcium: 7 mg/dL — ABNORMAL LOW (ref 8.9–10.3)
Chloride: 98 mmol/L (ref 98–111)
Creatinine, Ser: 5.1 mg/dL — ABNORMAL HIGH (ref 0.61–1.24)
GFR, Estimated: 12 mL/min — ABNORMAL LOW (ref >60)
Glucose, Bld: 162 mg/dL — ABNORMAL HIGH (ref 70–99)
Potassium: 3 mmol/L — ABNORMAL LOW (ref 3.5–5.1)
Sodium: 132 mmol/L — ABNORMAL LOW (ref 135–145)

## 2023-09-16 LAB — CBC
HCT: 20.3 % — ABNORMAL LOW (ref 39.0–52.0)
Hemoglobin: 6.4 g/dL — CL (ref 13.0–17.0)
MCH: 27.6 pg (ref 26.0–34.0)
MCHC: 31.5 g/dL (ref 30.0–36.0)
MCV: 87.5 fL (ref 80.0–100.0)
Platelets: 344 10*3/uL (ref 150–400)
RBC: 2.32 MIL/uL — ABNORMAL LOW (ref 4.22–5.81)
RDW: 19.9 % — ABNORMAL HIGH (ref 11.5–15.5)
WBC: 16 10*3/uL — ABNORMAL HIGH (ref 4.0–10.5)
nRBC: 0 % (ref 0.0–0.2)

## 2023-09-16 LAB — PREPARE RBC (CROSSMATCH)

## 2023-09-16 LAB — MAGNESIUM: Magnesium: 1.9 mg/dL (ref 1.7–2.4)

## 2023-09-16 MED ORDER — POTASSIUM CHLORIDE CRYS ER 20 MEQ PO TBCR
40.0000 meq | EXTENDED_RELEASE_TABLET | Freq: Once | ORAL | Status: AC
  Administered 2023-09-16: 40 meq via ORAL
  Filled 2023-09-16: qty 2

## 2023-09-16 MED ORDER — SODIUM CHLORIDE 0.9% IV SOLUTION: Freq: Once | INTRAVENOUS | Status: DC

## 2023-09-16 MED ORDER — INSULIN GLARGINE-YFGN 100 UNIT/ML ~~LOC~~ SOLN
10.0000 [IU] | Freq: Every day | SUBCUTANEOUS | Status: DC
  Administered 2023-09-16 – 2023-09-18 (×3): 10 [IU] via SUBCUTANEOUS
  Filled 2023-09-16 (×4): qty 0.1

## 2023-09-16 NOTE — Progress Notes (Signed)
 Occupational Therapy Treatment Patient Details Name: [name] [name] MRN: [id number] DOB: 1956-04-29 Today's Date: 09/16/2023   History of present illness 68 y.o. male presents to [facility] 09/07/23 w/ non healing ulcer on R heel and poor healing of L BKA. S/p L and R AKA on 3/18. Chest x-ray also showed mass like opacity on suprahilar region on L. PMHx: AKI with ESRD and HD, CHF, CAD s/p CABG x3,  DM, diabetic neuropathy, OSA, L BKA 01/26/23   OT comments  OT session focused on UB ADLs and bed mobility in preparation for functional tasks. Pt currently demonstrates ability to complete UB ADLs with Mod I to Contact guard assist for the tasks with fluctuating Contact guard assist to Mod assist to maintain sitting balance at EOB during tasks. Pt tolerated >10 minutes sitting at EOB with overall Fair balance. Pt requiring Min assist for bed mobility in preparation for functional tasks. Pt also demonstrating ability to complete lateral scoot at EOB with Min assist. Pt participated well in session and is making progress toward goals. Pt will benefit from continued acute skilled OT services to address deficits outlined below and increase safety and independence with functional tasks. Post acute discharge, pt will benefit from intensive inpatient skilled rehab services < 3 hours per day to maximize rehab potential.      If plan is discharge home, recommend the following:  A lot of help with walking and/or transfers;A lot of help with bathing/dressing/bathroom;Assistance with cooking/housework;Assist for transportation;Help with stairs or ramp for entrance   Equipment Recommendations  None recommended by OT (Pt already has needed equipment)    Recommendations for Other Services      Precautions / Restrictions Precautions Precautions: Fall Restrictions Weight Bearing Restrictions Per Provider Order: Yes RLE Weight Bearing Per Provider Order: Non weight bearing LLE Weight Bearing Per Provider Order: Non  weight bearing       Mobility Bed Mobility Overal bed mobility: Needs Assistance Bed Mobility: Supine to Sit, Sit to Supine     Supine to sit: Min assist Sit to supine: Min assist   General bed mobility comments: min A to elevate trunk and gain balance at EOB, min A to reposition once supine    Transfers Overall transfer level: Needs assistance Equipment used: None Transfers: Bed to chair/wheelchair/BSC            Lateral/Scoot Transfers: Min assist General transfer comment: min A to laterally scoot diagonally along EOB into bed to simulate lateral/scoot transfers, pt declining transfer OOB to chair     Balance Overall balance assessment: Needs assistance Sitting-balance support: Bilateral upper extremity supported, Single extremity supported (sitting EOB) Sitting balance-Leahy Scale: Fair Sitting balance - Comments: ranged from needing ModA to CGA once in sitting. Pt would occasionally lose balance posteriorly when reaching for a target requiring ModA to correct                                   ADL either performed or assessed with clinical judgement   ADL Overall ADL's : Needs assistance/impaired     Grooming: Oral care;Brushing hair;Modified independent;Contact guard assist;Moderate assistance;Sitting Grooming Details (indicate cue type and reason): Pt requiring Mod I with combing hair and oral care tasks, but requiring CGA to Mod assist to maintain sitting balance EOB during tasks Upper Body Bathing: Contact guard assist;Moderate assistance;Sitting Upper Body Bathing Details (indicate cue type and reason): Pt requiring up to CGA for task,  but requiring CGA to Mod assist to maintain sitting balance EOB during task                           General ADL Comments: Pt with decreased activity tolerance and decreased B UE and core strength affecting functional level.    Extremity/Trunk Assessment Upper Extremity Assessment Upper Extremity  Assessment: Left hand dominant;Generalized weakness   Lower Extremity Assessment Lower Extremity Assessment: Defer to PT evaluation        Vision   Vision Assessment?: No apparent visual deficits (with glasses) Additional Comments: wears glasses   Perception     Praxis     Communication Communication Communication: No apparent difficulties   Cognition Arousal: Alert Behavior During Therapy: Flat affect, WFL for tasks assessed/performed (Largely WFL but with flat affect) Cognition: No apparent impairments             OT - Cognition Comments: PT AAOx4 and pleasant throughout session.                 Following commands: Intact        Cueing      Exercises      Shoulder Instructions       General Comments      Pertinent Vitals/ Pain       Pain Assessment Pain Assessment: Faces Faces Pain Scale: Hurts a little bit Pain Location: B residual limbs and back Pain Descriptors / Indicators: Sore Pain Intervention(s): Monitored during session, Limited activity within patient's tolerance, Repositioned  Home Living                                          Prior Functioning/Environment              Frequency  Min 2X/week        Progress Toward Goals  OT Goals(current goals can now be found in the care plan section)  Progress towards OT goals: Progressing toward goals  Acute Rehab OT Goals Patient Stated Goal: to be independent and return home  Plan      Co-evaluation    PT/OT/SLP Co-Evaluation/Treatment: Yes Reason for Co-Treatment: For patient/therapist safety;To address functional/ADL transfers PT goals addressed during session: Mobility/safety with mobility;Balance OT goals addressed during session: ADL's and self-care      AM-PAC OT "6 Clicks" Daily Activity     Outcome Measure   Help from another person eating meals?: None Help from another person taking care of personal grooming?: A Little Help from another  person toileting, which includes using toliet, bedpan, or urinal?: A Lot Help from another person bathing (including washing, rinsing, drying)?: A Lot Help from another person to put on and taking off regular upper body clothing?: A Little Help from another person to put on and taking off regular lower body clothing?: A Lot 6 Click Score: 16    End of Session    OT Visit Diagnosis: Muscle weakness (generalized) (M62.81);Other abnormalities of gait and mobility (R26.89);Other (comment) (decreased activity tolerance)   Activity Tolerance Patient tolerated treatment well   Patient Left in bed;with call bell/phone within reach   Nurse Communication Mobility status        Time: 1340-1407 OT Time Calculation (min): 27 min  Charges: OT General Charges $OT Visit: 1 Visit OT Treatments $Self Care/Home Management : 8-22 mins  Manveer Gomes "Orson Eva.,  OTR/L, MA Acute Rehab [phone]  Lendon Colonel 09/16/2023, 4:41 PM

## 2023-09-16 NOTE — Progress Notes (Signed)
 Physical Therapy Treatment Patient Details Name: [name] [name] MRN: [id number] DOB: 1955-12-01 Today's Date: 09/16/2023   History of Present Illness 68 y.o. male presents to [facility] 09/07/23 w/ non healing ulcer on R heel and poor healing of L BKA. S/p L and R AKA on 3/18. Chest x-ray also showed mass like opacity on suprahilar region on L. PMHx: AKI with ESRD and HD, CHF, CAD s/p CABG x3,  DM, diabetic neuropathy, OSA, L BKA 01/26/23    PT Comments  Pt resting in bed on arrival and agreeable to session. Pt demonstrating good progress towards acute goals this session requiring light min A to come to sitting up EOB with assist needed to stabilize balance in sitting due to posterior bias. Pt able to maintain sitting balance with grossly CGA throughout session with periods of mod A during reaching outside BOS or ADLs requiring use of bil hands. Pt with good participation in trunk/core engagement exercises at EOB to improve seated balance/postural reactions. Patient will benefit from continued inpatient follow up therapy, <3 hours/day, will continue to follow acutely.    If plan is discharge home, recommend the following: A lot of help with walking and/or transfers;A lot of help with bathing/dressing/bathroom;Assistance with cooking/housework;Assist for transportation;Help with stairs or ramp for entrance   Can travel by private vehicle     No  Equipment Recommendations  None recommended by PT (Pt owns equipment)    Recommendations for Other Services       Precautions / Restrictions Precautions Precautions: Fall Restrictions Weight Bearing Restrictions Per Provider Order: Yes RLE Weight Bearing Per Provider Order: Non weight bearing LLE Weight Bearing Per Provider Order: Non weight bearing     Mobility  Bed Mobility Overal bed mobility: Needs Assistance Bed Mobility: Supine to Sit, Sit to Supine     Supine to sit: Min assist Sit to supine: Min assist   General bed mobility  comments: min A to elevate trunk and gain balance at EOB, min A to reposition once supine    Transfers Overall transfer level: Needs assistance Equipment used: None Transfers: Bed to chair/wheelchair/BSC            Lateral/Scoot Transfers: Min assist General transfer comment: min A to laterally scoot diagonally along EOB into bed to simulate lateral/scoot trasnfers, pt declining trasnfer OOB to chair    Ambulation/Gait                   Stairs             Wheelchair Mobility     Tilt Bed    Modified Rankin (Stroke Patients Only)       Balance Overall balance assessment: Needs assistance Sitting-balance support: Bilateral upper extremity supported Sitting balance-Leahy Scale: Fair Sitting balance - Comments: ranged from needing ModA to CGA once in sitting. Pt would occasionally lose balance oposterior when reaching for target requiring ModA to correct       Standing balance comment: n/a                            Communication Communication Communication: No apparent difficulties  Cognition Arousal: Alert Behavior During Therapy: Flat affect   PT - Cognitive impairments: No apparent impairments                         Following commands: Intact      Cueing    Exercises Other Exercises Other  Exercises: anterior/posterior leans x10 Other Exercises: propped sitting and return to midline x5 ea side    General Comments        Pertinent Vitals/Pain Pain Assessment Pain Assessment: Faces Faces Pain Scale: Hurts a little bit Pain Location: B residual limbs and back Pain Descriptors / Indicators: Sore Pain Intervention(s): Monitored during session, Limited activity within patient's tolerance    Home Living                          Prior Function            PT Goals (current goals can now be found in the care plan section) Acute Rehab PT Goals Patient Stated Goal: to get better PT Goal Formulation:  With patient Time For Goal Achievement: 09/25/23 Progress towards PT goals: Progressing toward goals    Frequency    Min 2X/week      PT Plan      Co-evaluation PT/OT/SLP Co-Evaluation/Treatment: Yes Reason for Co-Treatment: For patient/therapist safety;To address functional/ADL transfers PT goals addressed during session: Mobility/safety with mobility;Balance        AM-PAC PT "6 Clicks" Mobility   Outcome Measure  Help needed turning from your back to your side while in a flat bed without using bedrails?: Total Help needed moving from lying on your back to sitting on the side of a flat bed without using bedrails?: Total Help needed moving to and from a bed to a chair (including a wheelchair)?: Total Help needed standing up from a chair using your arms (e.g., wheelchair or bedside chair)?: Total Help needed to walk in hospital room?: Total Help needed climbing 3-5 steps with a railing? : Total 6 Click Score: 6    End of Session   Activity Tolerance: Patient tolerated treatment well Patient left: in bed;with call bell/phone within reach Nurse Communication: Mobility status PT Visit Diagnosis: Other abnormalities of gait and mobility (R26.89);Muscle weakness (generalized) (M62.81)     Time: 1340-1407 PT Time Calculation (min) (ACUTE ONLY): 27 min  Charges:    $Therapeutic Exercise: 8-22 mins PT General Charges $$ ACUTE PT VISIT: 1 Visit                     Tranise Forrest R. PTA Acute Rehabilitation Services Office: [phone]   [name] 09/16/2023, 4:22 PM

## 2023-09-16 NOTE — Progress Notes (Signed)
 [facility]                                                                              [name] [name], is a 68 y.o. male, DOB - 07-03-1955, AYT:[id number] Admit date - 09/07/2023    Outpatient Primary MD for the patient is [name], PA-C  LOS - 9  days  Chief Complaint  Patient presents with   Wound Infection       Brief summary   Patient is a 68 year old male with ESRD on HD MF, anemia, HTN, CAD status post CABG, DM type II, CHF with EF 30%, OSA on CPAP, paroxysmal A-fib, PAD with history of critical limb ischemia status post arthrectomy and stenting of left SFA and popliteal artery on 12/29/2022 by Dr. Randie Heinz followed by left BKA on 01/26/2023, right heel ulcer leading to right common femoral to below-knee popliteal artery bypass on 03/19/2023 presented to ED with ulcer on the right heel and foul-smelling discharge, difficulty with healing of his left BKA. In ED, seen by vascular surgery and was noted to have severe necrotic changes to the right lower extremities and nonhealing BKA on the left. Patient was also febrile, slightly hypotensive, lactic acid 2.5.  Chest x-ray showed masslike opacity in the suprahilar region of the left. Right foot x-ray showed diffuse soft tissue edema and extensive subcu gas with destructive changes of the first metatarsal joint likely osteomyelitis and septic arthritis. Patient was admitted for further workup Underwent bilateral above-knee amputations, antibiotics completed for 48 hours postop, awaiting placement and  Assessment & Plan    Principal Problem: Nonhealing right lower extremity ulcer with progressive gangrene  History of severe PAD with multiple interventions in the past -MRI of the right ankle and foot with osteomyelitis and soft tissue ulceration -Vascular surgery consulted and patient underwent right above-knee amputation on 09/08/2023.  Received 48 hours of antibiotics. -Blood cultures negative so far.    Nonhealing  left below-knee amputation site -   Patient underwent left above-knee amputation on 09/08/2023.  Will need to follow-up with vascular surgery as outpatient.   ESRD MF -Patient is on ESRD on hemodialysis 2x per week M, F -Nephrology following, HD per renal -Continue midodrine    Hypokalemia -K3.0, replaced   Acute blood loss anemia on anemia of chronic kidney disease.  -Hemoglobin on 3/23 on 7.3, baseline hemoglobin has been 7.3-7.5 -Hemoglobin 6.4 today, no active bleeding, bilateral lower extremity amputation sites with no discharge or bleeding. -Transfuse 1 unit packed RBCs    History of hypotension  BP soft, likely due to anemia - continue midodrine    CAD s/p CABG No active issues at this time.  Continue Lipitor   Diabetes mellitus type 2. -Hemoglobin A1c 6.1 on 09/07/2023, on Trulicity and Lantus 20 units twice daily at home  CBG (last 3)  Recent Labs    09/15/23 2102 09/16/23 0603 09/16/23 1153  GLUCAP 213* 177* 233*   Continue sliding scale insulin, CBGs elevated, add Lantus 10 units at bedtime    Chronic systolic heart failure. -Volume management with HD   OSA Continue CPAP at bedtime.  Continue inhalers.   Paroxysmal  atrial fibrillation. Continue Eliquis and amiodarone   COPD -continue  bronchodilators.  Stable, no wheezing  Pressure injury documentation Coccyx with stage III, POA, Left buttock stage II, POA, foam dressing   Estimated body mass index is 29.67 kg/m as calculated from the following:   Height as of this encounter: 5\' 10"  (1.778 m).   Weight as of this encounter: 93.8 kg.  Code Status: Full code DVT Prophylaxis:   apixaban (ELIQUIS) tablet 5 mg   Level of Care: Level of care: Progressive Family Communication: Updated patient Disposition Plan:      Remains inpatient appropriate:      Procedures:    Consultants:   Nephrology, vascular surgery  Antimicrobials:   Anti-infectives (From admission, onward)    Start      Dose/Rate Route Frequency Ordered Stop   09/08/23 1800  ceFEPIme (MAXIPIME) 2 g in sodium chloride 0.9 % 100 mL IVPB  Status:  Discontinued        2 g 200 mL/hr over 30 Minutes Intravenous Every 24 hours 09/07/23 2236 09/08/23 0906   09/08/23 1800  ceFEPIme (MAXIPIME) 1 g in sodium chloride 0.9 % 100 mL IVPB  Status:  Discontinued        1 g 200 mL/hr over 30 Minutes Intravenous Every 24 hours 09/08/23 0906 09/11/23 1001   09/08/23 1000  linezolid (ZYVOX) IVPB 600 mg  Status:  Discontinued        600 mg 300 mL/hr over 60 Minutes Intravenous Every 12 hours 09/08/23 0658 09/11/23 1001   09/08/23 0946  ceFAZolin (ANCEF) 2-4 GM/100ML-% IVPB       Note to Pharmacy: Shanda Bumps M: cabinet override      09/08/23 0946 09/08/23 2159   09/07/23 1930  linezolid (ZYVOX) IVPB 600 mg        600 mg 300 mL/hr over 60 Minutes Intravenous  Once 09/07/23 1923 09/07/23 2255   09/07/23 1815  clindamycin (CLEOCIN) IVPB 600 mg  Status:  Discontinued        600 mg 100 mL/hr over 30 Minutes Intravenous  Once 09/07/23 1808 09/07/23 1923   09/07/23 1745  ceFEPIme (MAXIPIME) 2 g in sodium chloride 0.9 % 100 mL IVPB        2 g 200 mL/hr over 30 Minutes Intravenous  Once 09/07/23 1734 09/07/23 2031   09/07/23 1745  vancomycin (VANCOCIN) IVPB 1000 mg/200 mL premix  Status:  Discontinued        1,000 mg 200 mL/hr over 60 Minutes Intravenous  Once 09/07/23 1734 09/07/23 1741   09/07/23 1745  vancomycin (VANCOREADY) IVPB 2000 mg/400 mL  Status:  Discontinued        2,000 mg 200 mL/hr over 120 Minutes Intravenous  Once 09/07/23 1741 09/07/23 1933          Medications  sodium chloride   Intravenous Once   amiodarone  200 mg Oral BID   apixaban  5 mg Oral BID   atorvastatin  80 mg Oral Daily   Chlorhexidine Gluconate Cloth  6 each Topical Q0600   Chlorhexidine Gluconate Cloth  6 each Topical Q0600   darbepoetin (ARANESP) injection - DIALYSIS  200 mcg Subcutaneous Q Tue-1800   doxercalciferol  1 mcg  Intravenous Once per day on Monday Friday   fluticasone furoate-vilanterol  2 puff Inhalation Daily   insulin aspart  0-9 Units Subcutaneous TID WC   midodrine  5 mg Oral Daily   montelukast  10 mg Oral Daily   multivitamin  1  tablet Oral Daily      Subjective:   [name] [name] was seen and examined today.  No acute complaints.  Hemoglobin low today 6.4, agrees with transfusion.  Patient denies dizziness, chest pain, shortness of breath, abdominal pain, N/V/D/C.  No acute events overnight.  No hematemesis, hematochezia or melena.  Objective:   Vitals:   09/16/23 0832 09/16/23 1000 09/16/23 1045 09/16/23 1151  BP: (!) 113/48 (!) 135/45 (!) 122/36 (!) 99/46  Pulse: (!) 57 (!) 59 (!) 59 62  Resp: 16 15 (!) 24 19  Temp: (!) 97.5 F (36.4 C) (!) 97.5 F (36.4 C) (!) 97.4 F (36.3 C) (!) 97.1 F (36.2 C)  TempSrc: Oral Oral Oral Axillary  SpO2: 98% 97% 97% 97%  Weight:      Height:        Intake/Output Summary (Last 24 hours) at 09/16/2023 1255 Last data filed at 09/15/2023 2258 Gross per 24 hour  Intake 0 ml  Output 304 ml  Net -304 ml     Wt Readings from Last 3 Encounters:  09/16/23 93.8 kg  08/18/23 103 kg  07/09/23 113.4 kg     Exam General: Alert and oriented x 3, NAD Cardiovascular: S1 S2 auscultated,  RRR Respiratory: Clear to auscultation bilaterally, no wheezing Gastrointestinal: Soft, nontender, nondistended, + bowel sounds Ext: bilateral AKA's, stumps clean and healing, no discharge or bleeding Neuro: no new deficits Psych: Normal affect     Data Reviewed:  I have personally reviewed following labs    CBC Lab Results  Component Value Date   WBC 16.0 (H) 09/16/2023   RBC 2.32 (L) 09/16/2023   HGB 6.4 (LL) 09/16/2023   HCT 20.3 (L) 09/16/2023   MCV 87.5 09/16/2023   MCH 27.6 09/16/2023   PLT 344 09/16/2023   MCHC 31.5 09/16/2023   RDW 19.9 (H) 09/16/2023   LYMPHSABS 0.0 (L) 09/07/2023   MONOABS 1.7 (H) 09/07/2023   EOSABS 0.0 09/07/2023    BASOSABS 0.3 (H) 09/07/2023     Last metabolic panel Lab Results  Component Value Date   NA 132 (L) 09/16/2023   K 3.0 (L) 09/16/2023   CL 98 09/16/2023   CO2 26 09/16/2023   BUN 42 (H) 09/16/2023   CREATININE 5.10 (H) 09/16/2023   GLUCOSE 162 (H) 09/16/2023   GFRNONAA 12 (L) 09/16/2023   GFRAA 42 (L) 10/18/2019   CALCIUM 7.0 (L) 09/16/2023   PHOS 4.8 (H) 09/09/2023   PROT 6.2 (L) 09/08/2023   ALBUMIN 1.8 (L) 09/08/2023   BILITOT 1.0 09/08/2023   ALKPHOS 54 09/08/2023   AST 66 (H) 09/08/2023   ALT 40 09/08/2023   ANIONGAP 8 09/16/2023    CBG (last 3)  Recent Labs    09/15/23 2102 09/16/23 0603 09/16/23 1153  GLUCAP 213* 177* 233*      Coagulation Profile: No results for input(s): "INR", "PROTIME" in the last 168 hours.   Radiology Studies: I have personally reviewed the imaging studies  No results found.     Thad Ranger M.D. [facility] 09/16/2023, 12:55 PM  Available via Epic secure chat 7am-7pm After 7 pm, please refer to night coverage provider listed on amion.

## 2023-09-16 NOTE — Plan of Care (Signed)

## 2023-09-16 NOTE — Progress Notes (Signed)
 [facility] night cross cover note:   I was notified by RN of the patient's hemoglobin level of 6.4 this morning.  Most recent prior hemoglobin level was checked 72 hours ago, and was found to be 7.3 at that time, down from 7.5 when check 96 hours ago.  It is noted that he required transfusion of 1 unit PRBC earlier during this current hospitalization.  He has a history of paroxysmal atrial fibrillation, and is currently on Eliquis.  Most recent vital signs appear stable, with heart rates in the 60s and most recent blood systolic blood pressures noted to be in the 120s mmHg. I subsequently ordered an updated type and screen and also ordered transfusion of 1 unit PRBC over 3 hours along with a posttransfusion H&H check.    Newton Pigg, DO Hospitalist

## 2023-09-16 NOTE — Progress Notes (Signed)
 Patient ID: [name] [name], male   DOB: 1955-08-13, 68 y.o.   MRN: [id number] S: No new complaints although hgb dropped again and is down to 6.4. O:BP (!) 122/36   Pulse (!) 59   Temp (!) 97.4 F (36.3 C) (Oral)   Resp (!) 24   Ht 5\' 10"  (1.778 m)   Wt 93.8 kg   SpO2 97%   BMI 29.67 kg/m   Intake/Output Summary (Last 24 hours) at 09/16/2023 1101 Last data filed at 09/15/2023 2258 Gross per 24 hour  Intake 0 ml  Output 2304 ml  Net -2304 ml   Intake/Output: I/O last 3 completed shifts: In: 240 [P.O.:240] Out: 2304 [Urine:300; Other:2000; Stool:4]  Intake/Output this shift:  No intake/output data recorded. Weight change:  Gen: NAD CVS: RRR Resp:CTA Abd: obese, +BS, soft, NT/ND Ext: s/p Bilateral AKA's, RUE AVF +T/B  Recent Labs  Lab 09/10/23 0810 09/12/23 0304 09/13/23 0301 09/16/23 0333  NA 132* 131* 131* 132*  K 3.8 3.0* 3.4* 3.0*  CL 94* 97* 95* 98  CO2 23 24 25 26   GLUCOSE 110* 109* 118* 162*  BUN 36* 27* 39* 42*  CREATININE 6.67* 5.17* 6.20* 5.10*  CALCIUM 6.5* 6.3* 6.7* 7.0*   Liver Function Tests: No results for input(s): "AST", "ALT", "ALKPHOS", "BILITOT", "PROT", "ALBUMIN" in the last 168 hours. No results for input(s): "LIPASE", "AMYLASE" in the last 168 hours. No results for input(s): "AMMONIA" in the last 168 hours. CBC: Recent Labs  Lab 09/10/23 0810 09/11/23 0834 09/12/23 0304 09/13/23 0301 09/16/23 0333  WBC 21.9* 21.2* 19.1* 17.1* 16.0*  HGB 7.9* 7.3* 7.5* 7.3* 6.4*  HCT 25.4* 23.3* 23.0* 22.9* 20.3*  MCV 87.0 86.0 83.9 84.5 87.5  PLT 370 371 404* 443* 344   Cardiac Enzymes: No results for input(s): "CKTOTAL", "CKMB", "CKMBINDEX", "TROPONINI" in the last 168 hours. CBG: Recent Labs  Lab 09/14/23 2100 09/15/23 0559 09/15/23 1714 09/15/23 2102 09/16/23 0603  GLUCAP 168* 116* 236* 213* 177*    Iron Studies: No results for input(s): "IRON", "TIBC", "TRANSFERRIN", "FERRITIN" in the last 72 hours. Studies/Results: No results  found.  sodium chloride   Intravenous Once   amiodarone  200 mg Oral BID   apixaban  5 mg Oral BID   atorvastatin  80 mg Oral Daily   Chlorhexidine Gluconate Cloth  6 each Topical Q0600   Chlorhexidine Gluconate Cloth  6 each Topical Q0600   darbepoetin (ARANESP) injection - DIALYSIS  200 mcg Subcutaneous Q Tue-1800   doxercalciferol  1 mcg Intravenous Once per day on Monday Friday   fluticasone furoate-vilanterol  2 puff Inhalation Daily   insulin aspart  0-9 Units Subcutaneous TID WC   midodrine  5 mg Oral Daily   montelukast  10 mg Oral Daily   multivitamin  1 tablet Oral Daily    BMET    Component Value Date/Time   NA 132 (L) 09/16/2023 0333   NA 139 04/17/2022 1452   K 3.0 (L) 09/16/2023 0333   CL 98 09/16/2023 0333   CO2 26 09/16/2023 0333   GLUCOSE 162 (H) 09/16/2023 0333   BUN 42 (H) 09/16/2023 0333   BUN 42 (H) 04/17/2022 1452   CREATININE 5.10 (H) 09/16/2023 0333   CALCIUM 7.0 (L) 09/16/2023 0333   GFRNONAA 12 (L) 09/16/2023 0333   GFRAA 42 (L) 10/18/2019 1006   CBC    Component Value Date/Time   WBC 16.0 (H) 09/16/2023 0333   RBC 2.32 (L) 09/16/2023 0333   HGB  6.4 (LL) 09/16/2023 0333   HGB 7.6 (L) 04/17/2022 1452   HCT 20.3 (L) 09/16/2023 0333   HCT 25.3 (L) 04/17/2022 1452   PLT 344 09/16/2023 0333   PLT 351 04/17/2022 1452   MCV 87.5 09/16/2023 0333   MCV 88 04/17/2022 1452   MCH 27.6 09/16/2023 0333   MCHC 31.5 09/16/2023 0333   RDW 19.9 (H) 09/16/2023 0333   RDW 15.5 (H) 04/17/2022 1452   LYMPHSABS 0.0 (L) 09/07/2023 1936   LYMPHSABS 1.4 02/18/2022 1117   MONOABS 1.7 (H) 09/07/2023 1936   EOSABS 0.0 09/07/2023 1936   EOSABS 0.3 02/18/2022 1117   BASOSABS 0.3 (H) 09/07/2023 1936   BASOSABS 0.1 02/18/2022 1117    OP HD: [facility] MWF (pt says it is M-F)  3h  B400   101kg  2K bath   RUA AVF  Heparin none - hectorol 1 mcg  - mircera 225 mcg q 2 wks, last 3/07       Assessment/ Plan: Gangrene right lower leg: Progressive issue, also  nonhealing left BKA site.  Patient underwent bilateral AKA on 09/08/2023.  Per [facility]. ESRD: on HD 2x per week (M-F).  Off schedule, had HD yesterday.  Wants to wait until Friday for another session.  Chronic hypotension: On midodrine at home, continue here Volume: 3.5 kg down by wts, which should be the case after sig amputation. 3 L off Friday. BP's soft now, lower UF to 1-2 L next HD.  Anemia of esrd:.  Hemoglobin 6.7-8.5 here, has received PRBCs here despite ESA.  Unclear source of loss.  To receive blood transfusion today. Secondary hyperparathyroidism: Corrected calcium and phos are in range.  PAF Chronic systolic HF  Irena Cords, MD [facility] [phone]

## 2023-09-17 DIAGNOSIS — M726 Necrotizing fasciitis: Secondary | ICD-10-CM | POA: Diagnosis not present

## 2023-09-17 DIAGNOSIS — A419 Sepsis, unspecified organism: Secondary | ICD-10-CM | POA: Diagnosis not present

## 2023-09-17 DIAGNOSIS — E876 Hypokalemia: Secondary | ICD-10-CM | POA: Diagnosis not present

## 2023-09-17 DIAGNOSIS — I96 Gangrene, not elsewhere classified: Secondary | ICD-10-CM | POA: Diagnosis not present

## 2023-09-17 LAB — CBC
HCT: 24.2 % — ABNORMAL LOW (ref 39.0–52.0)
Hemoglobin: 7.6 g/dL — ABNORMAL LOW (ref 13.0–17.0)
MCH: 28 pg (ref 26.0–34.0)
MCHC: 31.4 g/dL (ref 30.0–36.0)
MCV: 89.3 fL (ref 80.0–100.0)
Platelets: 312 10*3/uL (ref 150–400)
RBC: 2.71 MIL/uL — ABNORMAL LOW (ref 4.22–5.81)
RDW: 19.5 % — ABNORMAL HIGH (ref 11.5–15.5)
WBC: 18.2 10*3/uL — ABNORMAL HIGH (ref 4.0–10.5)
nRBC: 0 % (ref 0.0–0.2)

## 2023-09-17 LAB — RENAL FUNCTION PANEL
Albumin: 1.7 g/dL — ABNORMAL LOW (ref 3.5–5.0)
Anion gap: 9 (ref 5–15)
BUN: 48 mg/dL — ABNORMAL HIGH (ref 8–23)
CO2: 23 mmol/L (ref 22–32)
Calcium: 7.1 mg/dL — ABNORMAL LOW (ref 8.9–10.3)
Chloride: 100 mmol/L (ref 98–111)
Creatinine, Ser: 5.27 mg/dL — ABNORMAL HIGH (ref 0.61–1.24)
GFR, Estimated: 11 mL/min — ABNORMAL LOW (ref >60)
Glucose, Bld: 171 mg/dL — ABNORMAL HIGH (ref 70–99)
Phosphorus: 2.5 mg/dL (ref 2.5–4.6)
Potassium: 3.5 mmol/L (ref 3.5–5.1)
Sodium: 132 mmol/L — ABNORMAL LOW (ref 135–145)

## 2023-09-17 LAB — GLUCOSE, CAPILLARY
Glucose-Capillary: 138 mg/dL — ABNORMAL HIGH (ref 70–99)
Glucose-Capillary: 145 mg/dL — ABNORMAL HIGH (ref 70–99)
Glucose-Capillary: 197 mg/dL — ABNORMAL HIGH (ref 70–99)
Glucose-Capillary: 183 mg/dL — ABNORMAL HIGH (ref 70–99)

## 2023-09-17 NOTE — Consult Note (Signed)
 [facility] Liaison Consult Note   09/17/2023  Tavarus Poteete Wickens 1955-09-18 [id number]  [facility] Institute [VBCI]: High risk list having green banner [THN]   Primary Care Provider:  Arlyss Queen, with [facility], this provider currently declines VBCI [facility] programs   Insurance coverage: [employer] SNP   Review of patient's medical record for past medical history and membership affiliate roster reveals this Patient is found to be in a [employer] SNP [Special Needs Program] plan. This plan provides care management for the member. Provider not affiliated with [facility].  For additional questions or referrals please contact:    Plan: Will sign off.   Charlesetta Shanks, RN, BSN, CCM [employer], [facility] Liaison Direct Dial: [phone] or secure chat Email: [email or web address]

## 2023-09-17 NOTE — Progress Notes (Signed)
 [facility]                                                                              [name] [name], is a 68 y.o. male, DOB - 22-Jun-1956, XBJ:[id number] Admit date - 09/07/2023    Outpatient Primary MD for the patient is [name], PA-C  LOS - 10  days  Chief Complaint  Patient presents with   Wound Infection       Brief summary   Patient is a 68 year old male with ESRD on HD MF, anemia, HTN, CAD status post CABG, DM type II, CHF with EF 30%, OSA on CPAP, paroxysmal A-fib, PAD with history of critical limb ischemia status post arthrectomy and stenting of left SFA and popliteal artery on 12/29/2022 by Dr. Randie Heinz followed by left BKA on 01/26/2023, right heel ulcer leading to right common femoral to below-knee popliteal artery bypass on 03/19/2023 presented to ED with ulcer on the right heel and foul-smelling discharge, difficulty with healing of his left BKA. In ED, seen by vascular surgery and was noted to have severe necrotic changes to the right lower extremities and nonhealing BKA on the left. Patient was also febrile, slightly hypotensive, lactic acid 2.5.  Chest x-ray showed masslike opacity in the suprahilar region of the left. Right foot x-ray showed diffuse soft tissue edema and extensive subcu gas with destructive changes of the first metatarsal joint likely osteomyelitis and septic arthritis. Patient was admitted for further workup Underwent bilateral above-knee amputations, antibiotics completed for 48 hours postop, awaiting placement and  Assessment & Plan    Principal Problem: Nonhealing right lower extremity ulcer with progressive gangrene  History of severe PAD with multiple interventions in the past -MRI of the right ankle and foot with osteomyelitis and soft tissue ulceration -Vascular surgery consulted and patient underwent right above-knee amputation on 09/08/2023.  Received 48 hours of antibiotics. -Blood cultures negative so far.    Nonhealing  left below-knee amputation site -   Patient underwent left above-knee amputation on 09/08/2023.  Will need to follow-up with vascular surgery as outpatient.   ESRD MF -Patient is on ESRD on hemodialysis 2x per week M, F -Nephrology following, HD per renal -Continue midodrine    Hypokalemia -Replace as needed   Acute blood loss anemia on anemia of chronic kidney disease.  -Hemoglobin on 3/23 on 7.3, baseline hemoglobin has been 7.3-7.5 -Transfuse 1 unit packed RBCs on 3/26 for hemoglobin of 6.4.  No active bleeding -Hemoglobin 7.6 today    History of hypotension  BP soft, likely due to anemia - continue midodrine    CAD s/p CABG No active issues at this time.  Continue Lipitor   Diabetes mellitus type 2. -Hemoglobin A1c 6.1 on 09/07/2023, on Trulicity and Lantus 20 units twice daily at home  CBG (last 3)  Recent Labs    09/16/23 2100 09/17/23 0601 09/17/23 1215  GLUCAP 201* 138* 145*   Continue sliding scale insulin, CBGs elevated, add Lantus 10 units at bedtime, added NovoLog meal coverage 3 units 3 times daily AC    Chronic systolic heart failure. -Volume management with HD   OSA Continue CPAP  at bedtime.  Continue inhalers.   Paroxysmal atrial fibrillation. Continue Eliquis and amiodarone   COPD -continue  bronchodilators.  Stable, no wheezing  Pressure injury documentation Coccyx with stage III, POA, Left buttock stage II, POA, foam dressing   Estimated body mass index is 29.67 kg/m as calculated from the following:   Height as of this encounter: 5\' 10"  (1.778 m).   Weight as of this encounter: 93.8 kg.  Code Status: Full code DVT Prophylaxis:   apixaban (ELIQUIS) tablet 5 mg   Level of Care: Level of care: Progressive Family Communication: Updated patient Disposition Plan:      Remains inpatient appropriate:   Awaiting SNF   Procedures:    Consultants:   Nephrology, vascular surgery  Antimicrobials:   Anti-infectives (From admission,  onward)    Start     Dose/Rate Route Frequency Ordered Stop   09/08/23 1800  ceFEPIme (MAXIPIME) 2 g in sodium chloride 0.9 % 100 mL IVPB  Status:  Discontinued        2 g 200 mL/hr over 30 Minutes Intravenous Every 24 hours 09/07/23 2236 09/08/23 0906   09/08/23 1800  ceFEPIme (MAXIPIME) 1 g in sodium chloride 0.9 % 100 mL IVPB  Status:  Discontinued        1 g 200 mL/hr over 30 Minutes Intravenous Every 24 hours 09/08/23 0906 09/11/23 1001   09/08/23 1000  linezolid (ZYVOX) IVPB 600 mg  Status:  Discontinued        600 mg 300 mL/hr over 60 Minutes Intravenous Every 12 hours 09/08/23 0658 09/11/23 1001   09/08/23 0946  ceFAZolin (ANCEF) 2-4 GM/100ML-% IVPB       Note to Pharmacy: Shanda Bumps M: cabinet override      09/08/23 0946 09/08/23 2159   09/07/23 1930  linezolid (ZYVOX) IVPB 600 mg        600 mg 300 mL/hr over 60 Minutes Intravenous  Once 09/07/23 1923 09/07/23 2255   09/07/23 1815  clindamycin (CLEOCIN) IVPB 600 mg  Status:  Discontinued        600 mg 100 mL/hr over 30 Minutes Intravenous  Once 09/07/23 1808 09/07/23 1923   09/07/23 1745  ceFEPIme (MAXIPIME) 2 g in sodium chloride 0.9 % 100 mL IVPB        2 g 200 mL/hr over 30 Minutes Intravenous  Once 09/07/23 1734 09/07/23 2031   09/07/23 1745  vancomycin (VANCOCIN) IVPB 1000 mg/200 mL premix  Status:  Discontinued        1,000 mg 200 mL/hr over 60 Minutes Intravenous  Once 09/07/23 1734 09/07/23 1741   09/07/23 1745  vancomycin (VANCOREADY) IVPB 2000 mg/400 mL  Status:  Discontinued        2,000 mg 200 mL/hr over 120 Minutes Intravenous  Once 09/07/23 1741 09/07/23 1933          Medications  sodium chloride   Intravenous Once   amiodarone  200 mg Oral BID   apixaban  5 mg Oral BID   atorvastatin  80 mg Oral Daily   Chlorhexidine Gluconate Cloth  6 each Topical Q0600   Chlorhexidine Gluconate Cloth  6 each Topical Q0600   darbepoetin (ARANESP) injection - DIALYSIS  200 mcg Subcutaneous Q Tue-1800    doxercalciferol  1 mcg Intravenous Once per day on Monday Friday   fluticasone furoate-vilanterol  2 puff Inhalation Daily   insulin aspart  0-9 Units Subcutaneous TID WC   insulin glargine-yfgn  10 Units Subcutaneous QHS   midodrine  5 mg Oral Daily   montelukast  10 mg Oral Daily   multivitamin  1 tablet Oral Daily      Subjective:   [name] [name] was seen and examined today.  No acute complaints.  Awaiting SNF.  No chest pain, shortness of breath, abdominal pain nausea vomiting.    Objective:   Vitals:   09/17/23 0807 09/17/23 0827 09/17/23 0829 09/17/23 1213  BP: (!) 116/30  (!) 117/43 (!) 116/27  Pulse: 67  64 69  Resp: 18  14 20   Temp: (!) 97.5 F (36.4 C)   97.7 F (36.5 C)  TempSrc: Oral   Oral  SpO2:  100%    Weight:      Height:        Intake/Output Summary (Last 24 hours) at 09/17/2023 1319 Last data filed at 09/17/2023 1200 Gross per 24 hour  Intake 315 ml  Output 30 ml  Net 285 ml     Wt Readings from Last 3 Encounters:  09/16/23 93.8 kg  08/18/23 103 kg  07/09/23 113.4 kg   Physical Exam General: Alert and oriented x 3, NAD Cardiovascular: S1 S2 clear, RRR.  Respiratory: CTAB, no wheezing Gastrointestinal: Soft, nontender, nondistended, NBS Ext: bilateral AKA's, stump clean, healing, no bleeding Neuro: no new deficits Psych: Normal affect     Data Reviewed:  I have personally reviewed following labs    CBC Lab Results  Component Value Date   WBC 18.2 (H) 09/17/2023   RBC 2.71 (L) 09/17/2023   HGB 7.6 (L) 09/17/2023   HCT 24.2 (L) 09/17/2023   MCV 89.3 09/17/2023   MCH 28.0 09/17/2023   PLT 312 09/17/2023   MCHC 31.4 09/17/2023   RDW 19.5 (H) 09/17/2023   LYMPHSABS 0.0 (L) 09/07/2023   MONOABS 1.7 (H) 09/07/2023   EOSABS 0.0 09/07/2023   BASOSABS 0.3 (H) 09/07/2023     Last metabolic panel Lab Results  Component Value Date   NA 132 (L) 09/17/2023   K 3.5 09/17/2023   CL 100 09/17/2023   CO2 23 09/17/2023   BUN 48 (H)  09/17/2023   CREATININE 5.27 (H) 09/17/2023   GLUCOSE 171 (H) 09/17/2023   GFRNONAA 11 (L) 09/17/2023   GFRAA 42 (L) 10/18/2019   CALCIUM 7.1 (L) 09/17/2023   PHOS 2.5 09/17/2023   PROT 6.2 (L) 09/08/2023   ALBUMIN 1.7 (L) 09/17/2023   BILITOT 1.0 09/08/2023   ALKPHOS 54 09/08/2023   AST 66 (H) 09/08/2023   ALT 40 09/08/2023   ANIONGAP 9 09/17/2023    CBG (last 3)  Recent Labs    09/16/23 2100 09/17/23 0601 09/17/23 1215  GLUCAP 201* 138* 145*      Coagulation Profile: No results for input(s): "INR", "PROTIME" in the last 168 hours.   Radiology Studies: I have personally reviewed the imaging studies  No results found.     Thad Ranger M.D. [facility] 09/17/2023, 1:19 PM  Available via Epic secure chat 7am-7pm After 7 pm, please refer to night coverage provider listed on amion.

## 2023-09-17 NOTE — TOC Progression Note (Signed)
 Transition of Care [facility]) - Progression Note    Patient Details  Name: [name] [name] MRN: [id number] Date of Birth: [birth year]  Transition of [facility] CM/SW Contact  Erin Sons, [place] Phone Number: 09/17/2023, 10:29 AM  Clinical Narrative:     CSW met with pt and provided SNF bed offers. Pt chooses [facility]. Explained SNF auth process. CSW confirmed bed offer with [facility] healthcare.   Insurance auth request submitted in online portal. AuthID# [id number].  [facility] is currently pending.   Expected Discharge Plan: Skilled Nursing Facility Barriers to Discharge: Insurance Authorization  Expected Discharge Plan and Services In-house Referral: Clinical Social Work     Living arrangements for the past 2 months: Single Family Home                   DME Agency: [employer]                   Social Determinants of Health (SDOH) Interventions SDOH Screenings   Food Insecurity: No Food Insecurity (09/08/2023)  Housing: Low Risk  (09/08/2023)  Transportation Needs: No Transportation Needs (09/08/2023)  Utilities: Not At Risk (09/08/2023)  Social Connections: Moderately Isolated (09/08/2023)  Tobacco Use: Medium Risk (09/08/2023)    Readmission Risk Interventions    01/29/2023   11:34 AM  Readmission Risk Prevention Plan  Transportation Screening Complete  PCP or Specialist Appt within 5-7 Days Complete  Home Care Screening Complete  Medication Review (RN CM) Complete

## 2023-09-17 NOTE — Progress Notes (Signed)
 Patient ID: [name] [name], male   DOB: 07-21-1955, 68 y.o.   MRN: [id number] S: Feels better today O:BP (!) 117/43 (BP Location: Left Arm)   Pulse 64   Temp (!) 97.5 F (36.4 C) (Oral)   Resp 14   Ht 5\' 10"  (1.778 m)   Wt 93.8 kg   SpO2 100%   BMI 29.67 kg/m   Intake/Output Summary (Last 24 hours) at 09/17/2023 1034 Last data filed at 09/16/2023 1400 Gross per 24 hour  Intake 315 ml  Output --  Net 315 ml   Intake/Output: I/O last 3 completed shifts: In: 315 [Blood:315] Out: 1 [Stool:1]  Intake/Output this shift:  No intake/output data recorded. Weight change:  Gen: NAD CVS: RRR Resp:CTA Abd: +BS, soft, NT/ND Ext: s/p bilateral AKA's, no edema, RUE AVF +T/B  Recent Labs  Lab 09/12/23 0304 09/13/23 0301 09/16/23 0333 09/17/23 0344  NA 131* 131* 132* 132*  K 3.0* 3.4* 3.0* 3.5  CL 97* 95* 98 100  CO2 24 25 26 23   GLUCOSE 109* 118* 162* 171*  BUN 27* 39* 42* 48*  CREATININE 5.17* 6.20* 5.10* 5.27*  ALBUMIN  --   --   --  1.7*  CALCIUM 6.3* 6.7* 7.0* 7.1*  PHOS  --   --   --  2.5   Liver Function Tests: Recent Labs  Lab 09/17/23 0344  ALBUMIN 1.7*   No results for input(s): "LIPASE", "AMYLASE" in the last 168 hours. No results for input(s): "AMMONIA" in the last 168 hours. CBC: Recent Labs  Lab 09/11/23 0834 09/12/23 0304 09/13/23 0301 09/16/23 0333 09/16/23 1624 09/17/23 0344  WBC 21.2* 19.1* 17.1* 16.0*  --  18.2*  HGB 7.3* 7.5* 7.3* 6.4* 7.7* 7.6*  HCT 23.3* 23.0* 22.9* 20.3* 23.8* 24.2*  MCV 86.0 83.9 84.5 87.5  --  89.3  PLT 371 404* 443* 344  --  312   Cardiac Enzymes: No results for input(s): "CKTOTAL", "CKMB", "CKMBINDEX", "TROPONINI" in the last 168 hours. CBG: Recent Labs  Lab 09/16/23 0603 09/16/23 1153 09/16/23 1652 09/16/23 2100 09/17/23 0601  GLUCAP 177* 233* 215* 201* 138*    Iron Studies: No results for input(s): "IRON", "TIBC", "TRANSFERRIN", "FERRITIN" in the last 72 hours. Studies/Results: No results found.   sodium chloride   Intravenous Once   amiodarone  200 mg Oral BID   apixaban  5 mg Oral BID   atorvastatin  80 mg Oral Daily   Chlorhexidine Gluconate Cloth  6 each Topical Q0600   Chlorhexidine Gluconate Cloth  6 each Topical Q0600   darbepoetin (ARANESP) injection - DIALYSIS  200 mcg Subcutaneous Q Tue-1800   doxercalciferol  1 mcg Intravenous Once per day on Monday Friday   fluticasone furoate-vilanterol  2 puff Inhalation Daily   insulin aspart  0-9 Units Subcutaneous TID WC   insulin glargine-yfgn  10 Units Subcutaneous QHS   midodrine  5 mg Oral Daily   montelukast  10 mg Oral Daily   multivitamin  1 tablet Oral Daily    BMET    Component Value Date/Time   NA 132 (L) 09/17/2023 0344   NA 139 04/17/2022 1452   K 3.5 09/17/2023 0344   CL 100 09/17/2023 0344   CO2 23 09/17/2023 0344   GLUCOSE 171 (H) 09/17/2023 0344   BUN 48 (H) 09/17/2023 0344   BUN 42 (H) 04/17/2022 1452   CREATININE 5.27 (H) 09/17/2023 0344   CALCIUM 7.1 (L) 09/17/2023 0344   GFRNONAA 11 (L) 09/17/2023 0344  GFRAA 42 (L) 10/18/2019 1006   CBC    Component Value Date/Time   WBC 18.2 (H) 09/17/2023 0344   RBC 2.71 (L) 09/17/2023 0344   HGB 7.6 (L) 09/17/2023 0344   HGB 7.6 (L) 04/17/2022 1452   HCT 24.2 (L) 09/17/2023 0344   HCT 25.3 (L) 04/17/2022 1452   PLT 312 09/17/2023 0344   PLT 351 04/17/2022 1452   MCV 89.3 09/17/2023 0344   MCV 88 04/17/2022 1452   MCH 28.0 09/17/2023 0344   MCHC 31.4 09/17/2023 0344   RDW 19.5 (H) 09/17/2023 0344   RDW 15.5 (H) 04/17/2022 1452   LYMPHSABS 0.0 (L) 09/07/2023 1936   LYMPHSABS 1.4 02/18/2022 1117   MONOABS 1.7 (H) 09/07/2023 1936   EOSABS 0.0 09/07/2023 1936   EOSABS 0.3 02/18/2022 1117   BASOSABS 0.3 (H) 09/07/2023 1936   BASOSABS 0.1 02/18/2022 1117    OP HD: [facility] MWF (pt says it is M-F)  3h  B400   101kg  2K bath   RUA AVF  Heparin none - hectorol 1 mcg  - mircera 225 mcg q 2 wks, last 3/07       Assessment/ Plan: Gangrene right  lower leg: Progressive issue, also nonhealing left BKA site.  Patient underwent bilateral AKA on 09/08/2023.  Per [facility]. ESRD: on HD 2x per week (M-F).  Off schedule, had HD Tuesday.  Wants to wait until Friday for another session.  Plan for HD tomorrow. Chronic hypotension: On midodrine at home, continue here Volume: 3.5 kg down by wts, which should be the case after sig amputation. 3 L off Friday. BP's soft now, lower UF to 1-2 L next HD.  Anemia of esrd:.  Hemoglobin 6.7-8.5 here, has received PRBCs here despite ESA.  Unclear source of loss.  To receive blood transfusion today. Secondary hyperparathyroidism: Corrected calcium and phos are in range.  PAF Chronic systolic HF Disposition - awaiting SNF placement  Irena Cords, MD [facility] [phone]

## 2023-09-17 NOTE — Plan of Care (Signed)

## 2023-09-18 DIAGNOSIS — I96 Gangrene, not elsewhere classified: Secondary | ICD-10-CM | POA: Diagnosis not present

## 2023-09-18 DIAGNOSIS — E876 Hypokalemia: Secondary | ICD-10-CM | POA: Diagnosis not present

## 2023-09-18 DIAGNOSIS — M726 Necrotizing fasciitis: Secondary | ICD-10-CM | POA: Diagnosis not present

## 2023-09-18 DIAGNOSIS — A419 Sepsis, unspecified organism: Secondary | ICD-10-CM | POA: Diagnosis not present

## 2023-09-18 LAB — CBC
HCT: 22.7 % — ABNORMAL LOW (ref 39.0–52.0)
Hemoglobin: 7.2 g/dL — ABNORMAL LOW (ref 13.0–17.0)
MCH: 28.8 pg (ref 26.0–34.0)
MCHC: 31.7 g/dL (ref 30.0–36.0)
MCV: 90.8 fL (ref 80.0–100.0)
Platelets: 366 10*3/uL (ref 150–400)
RBC: 2.5 MIL/uL — ABNORMAL LOW (ref 4.22–5.81)
RDW: 19.8 % — ABNORMAL HIGH (ref 11.5–15.5)
WBC: 21.6 10*3/uL — ABNORMAL HIGH (ref 4.0–10.5)
nRBC: 0.1 % (ref 0.0–0.2)

## 2023-09-18 LAB — RENAL FUNCTION PANEL
Albumin: 1.6 g/dL — ABNORMAL LOW (ref 3.5–5.0)
Anion gap: 10 (ref 5–15)
BUN: 56 mg/dL — ABNORMAL HIGH (ref 8–23)
CO2: 23 mmol/L (ref 22–32)
Calcium: 7.4 mg/dL — ABNORMAL LOW (ref 8.9–10.3)
Chloride: 100 mmol/L (ref 98–111)
Creatinine, Ser: 5.08 mg/dL — ABNORMAL HIGH (ref 0.61–1.24)
GFR, Estimated: 12 mL/min — ABNORMAL LOW (ref >60)
Glucose, Bld: 217 mg/dL — ABNORMAL HIGH (ref 70–99)
Phosphorus: 2.4 mg/dL — ABNORMAL LOW (ref 2.5–4.6)
Potassium: 3.8 mmol/L (ref 3.5–5.1)
Sodium: 133 mmol/L — ABNORMAL LOW (ref 135–145)

## 2023-09-18 LAB — GLUCOSE, CAPILLARY
Glucose-Capillary: 167 mg/dL — ABNORMAL HIGH (ref 70–99)
Glucose-Capillary: 157 mg/dL — ABNORMAL HIGH (ref 70–99)
Glucose-Capillary: 162 mg/dL — ABNORMAL HIGH (ref 70–99)
Glucose-Capillary: 168 mg/dL — ABNORMAL HIGH (ref 70–99)

## 2023-09-18 NOTE — Progress Notes (Signed)
   09/18/23 1151  Vitals  Temp (!) 97.5 F (36.4 C)  Temp Source Axillary  BP (!) 141/53  MAP (mmHg) 79  BP Location Left Arm  BP Method Automatic  Patient Position (if appropriate) Lying  Pulse Rate 72  Pulse Rate Source Monitor  Resp 17  MEWS COLOR  MEWS Score Color Green  Oxygen Therapy  SpO2 96 %  O2 Device Room Air  Pain Assessment  Pain Scale 0-10  Pain Score 0    Patient returned from HD.

## 2023-09-18 NOTE — Progress Notes (Signed)
Patient left unit for dialysis at this time.   

## 2023-09-18 NOTE — Progress Notes (Signed)
 Received patient in bed to unit.  Alert and oriented.  Informed consent signed and in chart.   TX duration:2.5 - pt became verbally aggressive and threats to decannulate himself if I did not give him water "right now" - attempted to compromise for additional cup of ice at tx end - pt became increasingly hostile, tx terminated, refusal to sign AMA form, one signed and noted by this RN and placed into pt chart  Patient tolerated well.  Transported back to the room  Alert, without acute distress.  Hand-off given to patient's nurse.   Access used: RAVF Access issues: none  Total UF removed: 0.2L Medication(s) given: midodrine, norco   09/18/23 1116  Vitals  BP (!) 134/52  MAP (mmHg) 74  Pulse Rate 67  ECG Heart Rate 68  Oxygen Therapy  SpO2 96 %  During Treatment Monitoring  Blood Flow Rate (mL/min) 0 mL/min  Arterial Pressure (mmHg) 27.88 mmHg  Venous Pressure (mmHg) -36.96 mmHg  TMP (mmHg) 11.31 mmHg  Ultrafiltration Rate (mL/min) 0 mL/min  Dialysate Flow Rate (mL/min) 300 ml/min  Dialysate Potassium Concentration 3  Dialysate Calcium Concentration 2.5  Duration of HD Treatment -hour(s) 2.53 hour(s)  Cumulative Fluid Removed (mL) per Treatment  239.75  HD Safety Checks Performed Yes  Intra-Hemodialysis Comments Tx completed  Dialysis Fluid Bolus Normal Saline  Bolus Amount (mL) 300 mL  Post Treatment  Dialyzer Clearance Clear  Hemodialysis Intake (mL) 250 mL (PO & IVF bolus)  Liters Processed 60.7  Fluid Removed (mL) 239 mL  Tolerated HD Treatment Yes  AVG/AVF Arterial Site Held (minutes) 5 minutes  AVG/AVF Venous Site Held (minutes) 5 minutes  Fistula / Graft Right Upper arm  Placement Date/Time: (c)  (c)    Placed prior to admission: Yes  Orientation: Right  Access Location: Upper arm  Site Condition No complications  Status Deaccessed      Freddi Starr, RN [facility]

## 2023-09-18 NOTE — Plan of Care (Signed)

## 2023-09-18 NOTE — Procedures (Signed)
 I was present at this dialysis session. I have reviewed the session itself and made appropriate changes.   Vital signs in last 24 hours:  Temp:  [97.6 F (36.4 C)-98.1 F (36.7 C)] 97.6 F (36.4 C) (03/28 0500) Pulse Rate:  [62-69] 62 (03/28 0839) Resp:  [16-20] 16 (03/28 0839) BP: (116-165)/(27-121) 165/121 (03/28 0839) SpO2:  [98 %-100 %] 98 % (03/28 0839) Weight:  [96.8 kg] 96.8 kg (03/28 0822) Weight change:  Filed Weights   09/16/23 0500 09/18/23 0822  Weight: 93.8 kg 96.8 kg    Recent Labs  Lab 09/17/23 0344  NA 132*  K 3.5  CL 100  CO2 23  GLUCOSE 171*  BUN 48*  CREATININE 5.27*  CALCIUM 7.1*  PHOS 2.5    Recent Labs  Lab 09/13/23 0301 09/16/23 0333 09/16/23 1624 09/17/23 0344  WBC 17.1* 16.0*  --  18.2*  HGB 7.3* 6.4* 7.7* 7.6*  HCT 22.9* 20.3* 23.8* 24.2*  MCV 84.5 87.5  --  89.3  PLT 443* 344  --  312    Scheduled Meds:  sodium chloride   Intravenous Once   amiodarone  200 mg Oral BID   apixaban  5 mg Oral BID   atorvastatin  80 mg Oral Daily   Chlorhexidine Gluconate Cloth  6 each Topical Q0600   Chlorhexidine Gluconate Cloth  6 each Topical Q0600   darbepoetin (ARANESP) injection - DIALYSIS  200 mcg Subcutaneous Q Tue-1800   doxercalciferol  1 mcg Intravenous Once per day on Monday Friday   fluticasone furoate-vilanterol  2 puff Inhalation Daily   insulin aspart  0-9 Units Subcutaneous TID WC   insulin glargine-yfgn  10 Units Subcutaneous QHS   midodrine  5 mg Oral Daily   montelukast  10 mg Oral Daily   multivitamin  1 tablet Oral Daily   Continuous Infusions: PRN Meds:.acetaminophen **OR** acetaminophen, albuterol, HYDROcodone-acetaminophen, HYDROmorphone (DILAUDID) injection, ondansetron **OR** ondansetron (ZOFRAN) IV   Irena Cords,  MD 09/18/2023, 8:44 AM

## 2023-09-18 NOTE — Progress Notes (Signed)
 Occupational Therapy Treatment Patient Details Name: [name] [name] MRN: [id number] DOB: Sep 06, 1955 Today's Date: 09/18/2023   History of present illness 68 y.o. male [facility]23 w/ non healing ulcer on R heel and poor healing of L BKA. S/p L and R AKA on 3/18. Chest x-ray also showed mass like opacity on suprahilar region on L. PMHx: AKI with ESRD and HD, CHF, CAD s/p CABG x3,  DM, diabetic neuropathy, OSA, L BKA 01/26/23   OT comments  Patient received in supine with complaints of back pain. Patient declined EOB, self care,or mobility but agreeable to address UE HEP. Patient provided with written HEP and level 2 therapy bands. Patient educated on BUE strengthening exercises and was able to return demonstration. Patient will benefit from continued inpatient follow up therapy, <3 hours/day. Acute OT to continue to follow to address established goals to facilitate DC to next venue of care.       If plan is discharge home, recommend the following:  A lot of help with walking and/or transfers;A lot of help with bathing/dressing/bathroom;Assistance with cooking/housework;Assist for transportation;Help with stairs or ramp for entrance   Equipment Recommendations  None recommended by OT (PT already has needed equipment)    Recommendations for Other Services      Precautions / Restrictions Precautions Precautions: Fall Restrictions Weight Bearing Restrictions Per Provider Order: Yes RLE Weight Bearing Per Provider Order: Non weight bearing LLE Weight Bearing Per Provider Order: Non weight bearing       Mobility Bed Mobility                    Transfers                         Balance                                           ADL either performed or assessed with clinical judgement   ADL Overall ADL's : Needs assistance/impaired                                       General ADL Comments: decliend self care tasks,  focused on UE HEP    Extremity/Trunk Assessment              Vision       Perception     Praxis     Communication     Cognition Arousal: Alert Behavior During Therapy: Flat affect Cognition: No apparent impairments             OT - Cognition Comments: Alert and oriented x4, stated he was upset with HD but pleasant to therapist                 Following commands: Intact        Cueing      Exercises Exercises: General Upper Extremity General Exercises - Upper Extremity Shoulder Flexion: Strengthening, Both, 10 reps, Theraband Theraband Level (Shoulder Flexion): Level 2 (Red) Shoulder Horizontal ABduction: Strengthening, Both, 10 reps, Theraband Theraband Level (Shoulder Horizontal Abduction): Level 2 (Red) Elbow Flexion: Strengthening, Both, 10 reps, Theraband Theraband Level (Elbow Flexion): Level 2 (Red) Elbow Extension: Strengthening, 10 reps, Theraband    Shoulder Instructions  General Comments Patient declined self care tasks or EOB but agreeable to address UE HEP    Pertinent Vitals/ Pain       Pain Assessment Pain Assessment: Faces Faces Pain Scale: Hurts even more Pain Location:  (back) Pain Descriptors / Indicators: Sore Pain Intervention(s): Limited activity within patient's tolerance, Monitored during session, Premedicated before session  Home Living                                          Prior Functioning/Environment              Frequency  Min 2X/week        Progress Toward Goals  OT Goals(current goals can now be found in the care plan section)  Progress towards OT goals: Progressing toward goals  Acute Rehab OT Goals Patient Stated Goal: to go to rehab OT Goal Formulation: With patient Time For Goal Achievement: 09/25/23 Potential to Achieve Goals: Fair ADL Goals Additional ADL Goal #1: Sit EOB with setup for grooming task and demo good balance Additional ADL Goal #2: Supervision for  EOB ADL and demo good balance. Additional ADL Goal #3: Min A of 2 for a/p transfer to recliner  Plan      Co-evaluation                 AM-PAC OT "6 Clicks" Daily Activity     Outcome Measure   Help from another person eating meals?: None Help from another person taking care of personal grooming?: A Little Help from another person toileting, which includes using toliet, bedpan, or urinal?: A Lot Help from another person bathing (including washing, rinsing, drying)?: A Lot Help from another person to put on and taking off regular upper body clothing?: A Little Help from another person to put on and taking off regular lower body clothing?: A Lot 6 Click Score: 16    End of Session    OT Visit Diagnosis: Muscle weakness (generalized) (M62.81);Other abnormalities of gait and mobility (R26.89);Other (comment) (decreased activity tolerance) Pain - Right/Left: Left Pain - part of body: Leg   Activity Tolerance Patient tolerated treatment well   Patient Left in bed;with call bell/phone within reach   Nurse Communication Mobility status        Time: 7829-5621 OT Time Calculation (min): 18 min  Charges: OT General Charges $OT Visit: 1 Visit OT Treatments $Therapeutic Exercise: 8-22 mins  Alfonse Flavors, OTA Acute Rehabilitation Services  Office [phone]   Dewain Penning 09/18/2023, 3:01 PM

## 2023-09-18 NOTE — Progress Notes (Signed)
 [facility]                                                                              [name] [name], is a 68 y.o. male, DOB - Feb 07, 1956, ZOX:[id number] Admit date - 09/07/2023    Outpatient Primary MD for the patient is [name], PA-C  LOS - 11  days  Chief Complaint  Patient presents with   Wound Infection       Brief summary   Patient is a 68 year old male with ESRD on HD MF, anemia, HTN, CAD status post CABG, DM type II, CHF with EF 30%, OSA on CPAP, paroxysmal A-fib, PAD with history of critical limb ischemia status post arthrectomy and stenting of left SFA and popliteal artery on 12/29/2022 by Dr. Randie Heinz followed by left BKA on 01/26/2023, right heel ulcer leading to right common femoral to below-knee popliteal artery bypass on 03/19/2023 presented to ED with ulcer on the right heel and foul-smelling discharge, difficulty with healing of his left BKA. In ED, seen by vascular surgery and was noted to have severe necrotic changes to the right lower extremities and nonhealing BKA on the left. Patient was also febrile, slightly hypotensive, lactic acid 2.5.  Chest x-ray showed masslike opacity in the suprahilar region of the left. Right foot x-ray showed diffuse soft tissue edema and extensive subcu gas with destructive changes of the first metatarsal joint likely osteomyelitis and septic arthritis. Patient was admitted for further workup Underwent bilateral above-knee amputations, antibiotics completed for 48 hours postop, awaiting placement   Awaiting placement  Assessment & Plan    Principal Problem: Nonhealing right lower extremity ulcer with progressive gangrene  History of severe PAD with multiple interventions in the past -MRI of the right ankle and foot with osteomyelitis and soft tissue ulceration -Vascular surgery consulted and patient underwent right above-knee amputation on 09/08/2023.  Received 48 hours of antibiotics. -Blood cultures negative so  far.    Nonhealing left below-knee amputation site -   Patient underwent left above-knee amputation on 09/08/2023.  Will need to follow-up with vascular surgery as outpatient.   ESRD MF -Patient is on ESRD on hemodialysis 2x per week M, F -Nephrology following, HD per renal -Continue midodrine    Hypokalemia -Replace as needed   Acute blood loss anemia on anemia of chronic kidney disease.  -Hemoglobin on 3/23 on 7.3, baseline hemoglobin has been 7.3-7.5 -Transfuse 1 unit packed RBCs on 3/26 for hemoglobin of 6.4.  No active bleeding -Hemoglobin 7.2 today, recheck in a.m., will need transfusion if less than 7    History of hypotension  BP soft, likely due to anemia - continue midodrine    CAD s/p CABG No active issues at this time.  Continue Lipitor   Diabetes mellitus type 2. -Hemoglobin A1c 6.1 on 09/07/2023, on Trulicity and Lantus 20 units twice daily at home  CBG (last 3)  Recent Labs    09/17/23 2144 09/18/23 0618 09/18/23 1242  GLUCAP 183* 167* 157*   Continue sliding scale insulin, CBGs elevated, add Lantus 10 units at bedtime, added NovoLog meal coverage 3 units 3 times daily AC  Chronic systolic heart failure. -Volume management with HD   OSA Continue CPAP at bedtime.  Continue inhalers.   Paroxysmal atrial fibrillation. Continue Eliquis and amiodarone   COPD -continue  bronchodilators.  Stable, no wheezing  Pressure injury documentation Coccyx with stage III, POA, Left buttock stage II, POA, foam dressing   Estimated body mass index is 30.62 kg/m as calculated from the following:   Height as of this encounter: 5\' 10"  (1.778 m).   Weight as of this encounter: 96.8 kg.  Code Status: Full code DVT Prophylaxis:   apixaban (ELIQUIS) tablet 5 mg   Level of Care: Level of care: Progressive Family Communication: Updated patient Disposition Plan:      Remains inpatient appropriate:   Awaiting SNF   Procedures:    Consultants:   Nephrology,  vascular surgery  Antimicrobials:   Anti-infectives (From admission, onward)    Start     Dose/Rate Route Frequency Ordered Stop   09/08/23 1800  ceFEPIme (MAXIPIME) 2 g in sodium chloride 0.9 % 100 mL IVPB  Status:  Discontinued        2 g 200 mL/hr over 30 Minutes Intravenous Every 24 hours 09/07/23 2236 09/08/23 0906   09/08/23 1800  ceFEPIme (MAXIPIME) 1 g in sodium chloride 0.9 % 100 mL IVPB  Status:  Discontinued        1 g 200 mL/hr over 30 Minutes Intravenous Every 24 hours 09/08/23 0906 09/11/23 1001   09/08/23 1000  linezolid (ZYVOX) IVPB 600 mg  Status:  Discontinued        600 mg 300 mL/hr over 60 Minutes Intravenous Every 12 hours 09/08/23 0658 09/11/23 1001   09/08/23 0946  ceFAZolin (ANCEF) 2-4 GM/100ML-% IVPB       Note to Pharmacy: Shanda Bumps M: cabinet override      09/08/23 0946 09/08/23 2159   09/07/23 1930  linezolid (ZYVOX) IVPB 600 mg        600 mg 300 mL/hr over 60 Minutes Intravenous  Once 09/07/23 1923 09/07/23 2255   09/07/23 1815  clindamycin (CLEOCIN) IVPB 600 mg  Status:  Discontinued        600 mg 100 mL/hr over 30 Minutes Intravenous  Once 09/07/23 1808 09/07/23 1923   09/07/23 1745  ceFEPIme (MAXIPIME) 2 g in sodium chloride 0.9 % 100 mL IVPB        2 g 200 mL/hr over 30 Minutes Intravenous  Once 09/07/23 1734 09/07/23 2031   09/07/23 1745  vancomycin (VANCOCIN) IVPB 1000 mg/200 mL premix  Status:  Discontinued        1,000 mg 200 mL/hr over 60 Minutes Intravenous  Once 09/07/23 1734 09/07/23 1741   09/07/23 1745  vancomycin (VANCOREADY) IVPB 2000 mg/400 mL  Status:  Discontinued        2,000 mg 200 mL/hr over 120 Minutes Intravenous  Once 09/07/23 1741 09/07/23 1933          Medications  sodium chloride   Intravenous Once   amiodarone  200 mg Oral BID   apixaban  5 mg Oral BID   atorvastatin  80 mg Oral Daily   Chlorhexidine Gluconate Cloth  6 each Topical Q0600   Chlorhexidine Gluconate Cloth  6 each Topical Q0600   darbepoetin  (ARANESP) injection - DIALYSIS  200 mcg Subcutaneous Q Tue-1800   doxercalciferol  1 mcg Intravenous Once per day on Monday Friday   fluticasone furoate-vilanterol  2 puff Inhalation Daily   insulin aspart  0-9 Units Subcutaneous TID  WC   insulin glargine-yfgn  10 Units Subcutaneous QHS   midodrine  5 mg Oral Daily   montelukast  10 mg Oral Daily   multivitamin  1 tablet Oral Daily      Subjective:   [name] [name] was seen and examined today.  Seen in HD, no acute complaints, awaiting SNF.   Objective:   Vitals:   09/18/23 1116 09/18/23 1123 09/18/23 1136 09/18/23 1151  BP: (!) 134/52 (!) 133/49  (!) 141/53  Pulse: 67 68  72  Resp:    17  Temp:    (!) 97.5 F (36.4 C)  TempSrc:    Axillary  SpO2: 96% 98%  96%  Weight:   96.8 kg   Height:        Intake/Output Summary (Last 24 hours) at 09/18/2023 1353 Last data filed at 09/18/2023 1251 Gross per 24 hour  Intake 720 ml  Output 239 ml  Net 481 ml     Wt Readings from Last 3 Encounters:  09/18/23 96.8 kg  08/18/23 103 kg  07/09/23 113.4 kg    Physical Exam General: Alert and oriented x 3, NAD Cardiovascular: S1 S2 clear, RRR.  Respiratory: CTAB, no wheezing Gastrointestinal: Soft, nontender, nondistended, NBS Ext: bilateral AKA, no bleeding or discharge. Psych: Normal affect      Data Reviewed:  I have personally reviewed following labs    CBC Lab Results  Component Value Date   WBC 21.6 (H) 09/18/2023   RBC 2.50 (L) 09/18/2023   HGB 7.2 (L) 09/18/2023   HCT 22.7 (L) 09/18/2023   MCV 90.8 09/18/2023   MCH 28.8 09/18/2023   PLT 366 09/18/2023   MCHC 31.7 09/18/2023   RDW 19.8 (H) 09/18/2023   LYMPHSABS 0.0 (L) 09/07/2023   MONOABS 1.7 (H) 09/07/2023   EOSABS 0.0 09/07/2023   BASOSABS 0.3 (H) 09/07/2023     Last metabolic panel Lab Results  Component Value Date   NA 133 (L) 09/18/2023   K 3.8 09/18/2023   CL 100 09/18/2023   CO2 23 09/18/2023   BUN 56 (H) 09/18/2023   CREATININE 5.08 (H)  09/18/2023   GLUCOSE 217 (H) 09/18/2023   GFRNONAA 12 (L) 09/18/2023   GFRAA 42 (L) 10/18/2019   CALCIUM 7.4 (L) 09/18/2023   PHOS 2.4 (L) 09/18/2023   PROT 6.2 (L) 09/08/2023   ALBUMIN 1.6 (L) 09/18/2023   BILITOT 1.0 09/08/2023   ALKPHOS 54 09/08/2023   AST 66 (H) 09/08/2023   ALT 40 09/08/2023   ANIONGAP 10 09/18/2023    CBG (last 3)  Recent Labs    09/17/23 2144 09/18/23 0618 09/18/23 1242  GLUCAP 183* 167* 157*      Coagulation Profile: No results for input(s): "INR", "PROTIME" in the last 168 hours.   Radiology Studies: I have personally reviewed the imaging studies  No results found.     Thad Ranger M.D. [facility] 09/18/2023, 1:53 PM  Available via Epic secure chat 7am-7pm After 7 pm, please refer to night coverage provider listed on amion.

## 2023-09-18 NOTE — Progress Notes (Signed)
 PT Cancellation Note  Patient Details Name: [name] [name] MRN: [id number] DOB: [birth year]   Cancelled Treatment:    Reason Eval/Treat Not Completed: (P) Patient at procedure or test/unavailable, pt off unit at HD. Will check back as schedule allows to continue with PT POC.  [name]. PTA Acute Rehabilitation Services Office: [phone]    [name] 09/18/2023, 8:30 AM

## 2023-09-18 NOTE — TOC Progression Note (Signed)
 Transition of Care [facility]) - Progression Note    Patient Details  Name: [name] [name] MRN: [id number] Date of Birth: 12-Sep-1955  Transition of Care [facility]) CM/SW Contact  Eduard Roux, [place] Phone Number: 09/18/2023, 5:21 PM  Clinical Narrative:     [facility] approved # [id number], 3/27-3/31    [facility] Care SNF  can not admit until Sunday.  Antony Blackbird, MSW, LCSW Clinical Social Worker    Expected Discharge Plan: Skilled Nursing Facility Barriers to Discharge: Insurance Authorization  Expected Discharge Plan and Services In-house Referral: Clinical Social Work     Living arrangements for the past 2 months: Single Family Home                   DME Agency: [employer]                   Social Determinants of Health (SDOH) Interventions SDOH Screenings   Food Insecurity: No Food Insecurity (09/08/2023)  Housing: Low Risk  (09/08/2023)  Transportation Needs: No Transportation Needs (09/08/2023)  Utilities: Not At Risk (09/08/2023)  Social Connections: Moderately Isolated (09/08/2023)  Tobacco Use: Medium Risk (09/08/2023)    Readmission Risk Interventions    01/29/2023   11:34 AM  Readmission Risk Prevention Plan  Transportation Screening Complete  PCP or Specialist Appt within 5-7 Days Complete  Home Care Screening Complete  Medication Review (RN CM) Complete

## 2023-09-19 DIAGNOSIS — E876 Hypokalemia: Secondary | ICD-10-CM | POA: Diagnosis not present

## 2023-09-19 DIAGNOSIS — I96 Gangrene, not elsewhere classified: Secondary | ICD-10-CM | POA: Diagnosis not present

## 2023-09-19 DIAGNOSIS — M726 Necrotizing fasciitis: Secondary | ICD-10-CM | POA: Diagnosis not present

## 2023-09-19 DIAGNOSIS — A419 Sepsis, unspecified organism: Secondary | ICD-10-CM | POA: Diagnosis not present

## 2023-09-19 LAB — CBC
HCT: 22.7 % — ABNORMAL LOW (ref 39.0–52.0)
Hemoglobin: 7 g/dL — ABNORMAL LOW (ref 13.0–17.0)
MCH: 28.3 pg (ref 26.0–34.0)
MCHC: 30.8 g/dL (ref 30.0–36.0)
MCV: 91.9 fL (ref 80.0–100.0)
Platelets: 388 10*3/uL (ref 150–400)
RBC: 2.47 MIL/uL — ABNORMAL LOW (ref 4.22–5.81)
RDW: 20.7 % — ABNORMAL HIGH (ref 11.5–15.5)
WBC: 23.1 10*3/uL — ABNORMAL HIGH (ref 4.0–10.5)
nRBC: 0.1 % (ref 0.0–0.2)

## 2023-09-19 LAB — RENAL FUNCTION PANEL
Albumin: 1.8 g/dL — ABNORMAL LOW (ref 3.5–5.0)
Anion gap: 10 (ref 5–15)
BUN: 36 mg/dL — ABNORMAL HIGH (ref 8–23)
CO2: 24 mmol/L (ref 22–32)
Calcium: 7.6 mg/dL — ABNORMAL LOW (ref 8.9–10.3)
Chloride: 98 mmol/L (ref 98–111)
Creatinine, Ser: 3.38 mg/dL — ABNORMAL HIGH (ref 0.61–1.24)
GFR, Estimated: 19 mL/min — ABNORMAL LOW (ref >60)
Glucose, Bld: 152 mg/dL — ABNORMAL HIGH (ref 70–99)
Phosphorus: 2.5 mg/dL (ref 2.5–4.6)
Potassium: 3.8 mmol/L (ref 3.5–5.1)
Sodium: 132 mmol/L — ABNORMAL LOW (ref 135–145)

## 2023-09-19 LAB — GLUCOSE, CAPILLARY
Glucose-Capillary: 144 mg/dL — ABNORMAL HIGH (ref 70–99)
Glucose-Capillary: 284 mg/dL — ABNORMAL HIGH (ref 70–99)
Glucose-Capillary: 162 mg/dL — ABNORMAL HIGH (ref 70–99)
Glucose-Capillary: 122 mg/dL — ABNORMAL HIGH (ref 70–99)

## 2023-09-19 LAB — PREPARE RBC (CROSSMATCH)

## 2023-09-19 LAB — PROCALCITONIN: Procalcitonin: 0.52 ng/mL

## 2023-09-19 MED ORDER — SODIUM CHLORIDE 0.9% IV SOLUTION: Freq: Once | INTRAVENOUS | Status: DC

## 2023-09-19 MED ORDER — INSULIN GLARGINE-YFGN 100 UNIT/ML ~~LOC~~ SOLN
15.0000 [IU] | Freq: Every day | SUBCUTANEOUS | Status: DC
  Administered 2023-09-19: 15 [IU] via SUBCUTANEOUS
  Filled 2023-09-19 (×2): qty 0.15

## 2023-09-19 MED ORDER — INSULIN ASPART 100 UNIT/ML IJ SOLN
3.0000 [IU] | Freq: Three times a day (TID) | INTRAMUSCULAR | Status: DC
  Administered 2023-09-19 – 2023-09-20 (×4): 3 [IU] via SUBCUTANEOUS

## 2023-09-19 NOTE — Progress Notes (Addendum)
 [facility]                                                                              [name] [name], is a 68 y.o. male, DOB - [birth year], ZOX:[id number] Admit date - 09/07/2023    Outpatient Primary MD for the patient is [name], PA-C  LOS - 12  days  Chief Complaint  Patient presents with   Wound Infection       Brief summary   Patient is a 68 year old male with ESRD on HD MF, anemia, HTN, CAD status post CABG, DM type II, CHF with EF 30%, OSA on CPAP, paroxysmal A-fib, PAD with history of critical limb ischemia status post arthrectomy and stenting of left SFA and popliteal artery on 12/29/2022 by Dr. Randie Heinz followed by left BKA on 01/26/2023, right heel ulcer leading to right common femoral to below-knee popliteal artery bypass on 03/19/2023 presented to ED with ulcer on the right heel and foul-smelling discharge, difficulty with healing of his left BKA. In ED, seen by vascular surgery and was noted to have severe necrotic changes to the right lower extremities and nonhealing BKA on the left. Patient was also febrile, slightly hypotensive, lactic acid 2.5.  Chest x-ray showed masslike opacity in the suprahilar region of the left. Right foot x-ray showed diffuse soft tissue edema and extensive subcu gas with destructive changes of the first metatarsal joint likely osteomyelitis and septic arthritis. Patient was admitted for further workup Underwent bilateral above-knee amputations, antibiotics completed for 48 hours postop, awaiting placement   Awaiting placement  Assessment & Plan    Principal Problem: Nonhealing right lower extremity ulcer with progressive gangrene  History of severe PAD with multiple interventions in the past -MRI of the right ankle and foot with osteomyelitis and soft tissue ulceration -Vascular surgery consulted and patient underwent right above-knee amputation on 09/08/2023.  Received 48 hours of antibiotics. -Blood cultures negative so  far. -Leukocytosis trending up, 23.1 today, no fevers, not on any steroids  -obtain procalcitonin, differential, repeat blood cultures   Nonhealing left below-knee amputation site -   Patient underwent left above-knee amputation on 09/08/2023.  Will need to follow-up with vascular surgery as outpatient.   ESRD MF -Patient is on ESRD on hemodialysis 2x per week M, F -Nephrology following, HD per renal -Continue midodrine    Hypokalemia -Replace as needed   Acute blood loss anemia on anemia of chronic kidney disease.  -Hemoglobin on 3/23 on 7.3, baseline hemoglobin has been 7.3-7.5 -Transfuse 1 unit packed RBCs on 3/26 for hemoglobin of 6.4.  No active bleeding -Hemoglobin 7.0, will give 1 unit packed RBCs    History of hypotension  BP soft, likely due to anemia - continue midodrine    CAD s/p CABG No active issues at this time.  Continue Lipitor   Diabetes mellitus type 2.  IDDM, uncontrolled with hyperglycemia -Hemoglobin A1c 6.1 on 09/07/2023, on Trulicity and Lantus 20 units twice daily at home  CBG (last 3)  Recent Labs    09/18/23 1633 09/18/23 2139 09/19/23 0619  GLUCAP 162* 168* 144*   -CBGs elevated, continue sliding scale insulin, increase Lantus  to 15 units at bedtime, added NovoLog meal coverage 3 units 3 times daily AC    Chronic systolic heart failure. -Volume management with HD   OSA Continue CPAP at bedtime.  Continue inhalers.   Paroxysmal atrial fibrillation. Continue Eliquis and amiodarone   COPD -continue  bronchodilators.  Stable, no wheezing  Pressure injury documentation Coccyx with stage III, POA, Left buttock stage II, POA, foam dressing   Estimated body mass index is 30.62 kg/m as calculated from the following:   Height as of this encounter: 5\' 10"  (1.778 m).   Weight as of this encounter: 96.8 kg.  Code Status: Full code DVT Prophylaxis:   apixaban (ELIQUIS) tablet 5 mg   Level of Care: Level of care: Progressive Family  Communication: Updated patient Disposition Plan:      Remains inpatient appropriate:   Awaiting SNF   Procedures:    Consultants:   Nephrology, vascular surgery  Antimicrobials:   Anti-infectives (From admission, onward)    Start     Dose/Rate Route Frequency Ordered Stop   09/08/23 1800  ceFEPIme (MAXIPIME) 2 g in sodium chloride 0.9 % 100 mL IVPB  Status:  Discontinued        2 g 200 mL/hr over 30 Minutes Intravenous Every 24 hours 09/07/23 2236 09/08/23 0906   09/08/23 1800  ceFEPIme (MAXIPIME) 1 g in sodium chloride 0.9 % 100 mL IVPB  Status:  Discontinued        1 g 200 mL/hr over 30 Minutes Intravenous Every 24 hours 09/08/23 0906 09/11/23 1001   09/08/23 1000  linezolid (ZYVOX) IVPB 600 mg  Status:  Discontinued        600 mg 300 mL/hr over 60 Minutes Intravenous Every 12 hours 09/08/23 0658 09/11/23 1001   09/08/23 0946  ceFAZolin (ANCEF) 2-4 GM/100ML-% IVPB       Note to Pharmacy: Shanda Bumps M: cabinet override      09/08/23 0946 09/08/23 2159   09/07/23 1930  linezolid (ZYVOX) IVPB 600 mg        600 mg 300 mL/hr over 60 Minutes Intravenous  Once 09/07/23 1923 09/07/23 2255   09/07/23 1815  clindamycin (CLEOCIN) IVPB 600 mg  Status:  Discontinued        600 mg 100 mL/hr over 30 Minutes Intravenous  Once 09/07/23 1808 09/07/23 1923   09/07/23 1745  ceFEPIme (MAXIPIME) 2 g in sodium chloride 0.9 % 100 mL IVPB        2 g 200 mL/hr over 30 Minutes Intravenous  Once 09/07/23 1734 09/07/23 2031   09/07/23 1745  vancomycin (VANCOCIN) IVPB 1000 mg/200 mL premix  Status:  Discontinued        1,000 mg 200 mL/hr over 60 Minutes Intravenous  Once 09/07/23 1734 09/07/23 1741   09/07/23 1745  vancomycin (VANCOREADY) IVPB 2000 mg/400 mL  Status:  Discontinued        2,000 mg 200 mL/hr over 120 Minutes Intravenous  Once 09/07/23 1741 09/07/23 1933          Medications  sodium chloride   Intravenous Once   amiodarone  200 mg Oral BID   apixaban  5 mg Oral BID    atorvastatin  80 mg Oral Daily   Chlorhexidine Gluconate Cloth  6 each Topical Q0600   Chlorhexidine Gluconate Cloth  6 each Topical Q0600   darbepoetin (ARANESP) injection - DIALYSIS  200 mcg Subcutaneous Q Tue-1800   doxercalciferol  1 mcg Intravenous Once per day on Monday Friday  fluticasone furoate-vilanterol  2 puff Inhalation Daily   insulin aspart  0-9 Units Subcutaneous TID WC   insulin glargine-yfgn  10 Units Subcutaneous QHS   midodrine  5 mg Oral Daily   montelukast  10 mg Oral Daily   multivitamin  1 tablet Oral Daily      Subjective:   [name] [name] was seen and examined today.  No acute complaints, patient reports he will be discharged to SNF tomorrow.  Awaiting placement.  Objective:   Vitals:   09/19/23 0036 09/19/23 0344 09/19/23 0814 09/19/23 1124  BP: (!) 117/43 (!) 154/57 (!) 103/36 (!) 126/50  Pulse: 60     Resp: 18 17 19 20   Temp: 97.8 F (36.6 C) 98.2 F (36.8 C) 98.1 F (36.7 C) 98.4 F (36.9 C)  TempSrc: Oral Oral Oral Oral  SpO2: 98%     Weight:      Height:        Intake/Output Summary (Last 24 hours) at 09/19/2023 1124 Last data filed at 09/19/2023 0700 Gross per 24 hour  Intake 598 ml  Output 200 ml  Net 398 ml     Wt Readings from Last 3 Encounters:  09/18/23 96.8 kg  08/18/23 103 kg  07/09/23 113.4 kg   Physical Exam General: Alert and oriented x 3, NAD Cardiovascular: S1 S2 clear, RRR.  Respiratory: CTAB, no wheezing Gastrointestinal: Soft, nontender, nondistended, NBS Ext: b/l AKA Neuro: no new deficits Psych: Normal affect     Data Reviewed:  I have personally reviewed following labs    CBC Lab Results  Component Value Date   WBC 23.1 (H) 09/19/2023   RBC 2.47 (L) 09/19/2023   HGB 7.0 (L) 09/19/2023   HCT 22.7 (L) 09/19/2023   MCV 91.9 09/19/2023   MCH 28.3 09/19/2023   PLT 388 09/19/2023   MCHC 30.8 09/19/2023   RDW 20.7 (H) 09/19/2023   LYMPHSABS 0.0 (L) 09/07/2023   MONOABS 1.7 (H) 09/07/2023   EOSABS  0.0 09/07/2023   BASOSABS 0.3 (H) 09/07/2023     Last metabolic panel Lab Results  Component Value Date   NA 132 (L) 09/19/2023   K 3.8 09/19/2023   CL 98 09/19/2023   CO2 24 09/19/2023   BUN 36 (H) 09/19/2023   CREATININE 3.38 (H) 09/19/2023   GLUCOSE 152 (H) 09/19/2023   GFRNONAA 19 (L) 09/19/2023   GFRAA 42 (L) 10/18/2019   CALCIUM 7.6 (L) 09/19/2023   PHOS 2.5 09/19/2023   PROT 6.2 (L) 09/08/2023   ALBUMIN 1.8 (L) 09/19/2023   BILITOT 1.0 09/08/2023   ALKPHOS 54 09/08/2023   AST 66 (H) 09/08/2023   ALT 40 09/08/2023   ANIONGAP 10 09/19/2023    CBG (last 3)  Recent Labs    09/18/23 1633 09/18/23 2139 09/19/23 0619  GLUCAP 162* 168* 144*      Coagulation Profile: No results for input(s): "INR", "PROTIME" in the last 168 hours.   Radiology Studies: I have personally reviewed the imaging studies  No results found.     Thad Ranger M.D. [facility] 09/19/2023, 11:24 AM  Available via Epic secure chat 7am-7pm After 7 pm, please refer to night coverage provider listed on amion.

## 2023-09-19 NOTE — Progress Notes (Signed)
 Patient ID: [name] [name], male   DOB: May 04, 1956, 68 y.o.   MRN: [id number] S: He became agitated yesterday and was taken off of HD after 2.5 hrs because he was hostile and demanded a cup of water or ice. O:BP (!) 103/36 (BP Location: Left Wrist)   Pulse 60   Temp 98.1 F (36.7 C) (Oral)   Resp 19   Ht 5\' 10"  (1.778 m)   Wt 96.8 kg   SpO2 98%   BMI 30.62 kg/m   Intake/Output Summary (Last 24 hours) at 09/19/2023 0851 Last data filed at 09/19/2023 0700 Gross per 24 hour  Intake 598 ml  Output 439 ml  Net 159 ml   Intake/Output: I/O last 3 completed shifts: In: 838 [P.O.:838] Out: 439 [Urine:200; Other:239]  Intake/Output this shift:  No intake/output data recorded. Weight change:  Gen: NAD CVS: RRR Resp: CTA Abd: +BS, soft, obese, NT Ext: s/p bilateral AKA's, LUE AVF +T/B  Recent Labs  Lab 09/13/23 0301 09/16/23 0333 09/17/23 0344 09/18/23 0924 09/19/23 0340  NA 131* 132* 132* 133* 132*  K 3.4* 3.0* 3.5 3.8 3.8  CL 95* 98 100 100 98  CO2 25 26 23 23 24   GLUCOSE 118* 162* 171* 217* 152*  BUN 39* 42* 48* 56* 36*  CREATININE 6.20* 5.10* 5.27* 5.08* 3.38*  ALBUMIN  --   --  1.7* 1.6* 1.8*  CALCIUM 6.7* 7.0* 7.1* 7.4* 7.6*  PHOS  --   --  2.5 2.4* 2.5   Liver Function Tests: Recent Labs  Lab 09/17/23 0344 09/18/23 0924 09/19/23 0340  ALBUMIN 1.7* 1.6* 1.8*   No results for input(s): "LIPASE", "AMYLASE" in the last 168 hours. No results for input(s): "AMMONIA" in the last 168 hours. CBC: Recent Labs  Lab 09/13/23 0301 09/16/23 0333 09/16/23 1624 09/17/23 0344 09/18/23 0923 09/19/23 0340  WBC 17.1* 16.0*  --  18.2* 21.6* 23.1*  HGB 7.3* 6.4*   < > 7.6* 7.2* 7.0*  HCT 22.9* 20.3*   < > 24.2* 22.7* 22.7*  MCV 84.5 87.5  --  89.3 90.8 91.9  PLT 443* 344  --  312 366 388   < > = values in this interval not displayed.   Cardiac Enzymes: No results for input(s): "CKTOTAL", "CKMB", "CKMBINDEX", "TROPONINI" in the last 168 hours. CBG: Recent Labs   Lab 09/18/23 0618 09/18/23 1242 09/18/23 1633 09/18/23 2139 09/19/23 0619  GLUCAP 167* 157* 162* 168* 144*    Iron Studies: No results for input(s): "IRON", "TIBC", "TRANSFERRIN", "FERRITIN" in the last 72 hours. Studies/Results: No results found.  sodium chloride   Intravenous Once   amiodarone  200 mg Oral BID   apixaban  5 mg Oral BID   atorvastatin  80 mg Oral Daily   Chlorhexidine Gluconate Cloth  6 each Topical Q0600   Chlorhexidine Gluconate Cloth  6 each Topical Q0600   darbepoetin (ARANESP) injection - DIALYSIS  200 mcg Subcutaneous Q Tue-1800   doxercalciferol  1 mcg Intravenous Once per day on Monday Friday   fluticasone furoate-vilanterol  2 puff Inhalation Daily   insulin aspart  0-9 Units Subcutaneous TID WC   insulin glargine-yfgn  10 Units Subcutaneous QHS   midodrine  5 mg Oral Daily   montelukast  10 mg Oral Daily   multivitamin  1 tablet Oral Daily    BMET    Component Value Date/Time   NA 132 (L) 09/19/2023 0340   NA 139 04/17/2022 1452   K 3.8 09/19/2023 0340  CL 98 09/19/2023 0340   CO2 24 09/19/2023 0340   GLUCOSE 152 (H) 09/19/2023 0340   BUN 36 (H) 09/19/2023 0340   BUN 42 (H) 04/17/2022 1452   CREATININE 3.38 (H) 09/19/2023 0340   CALCIUM 7.6 (L) 09/19/2023 0340   GFRNONAA 19 (L) 09/19/2023 0340   GFRAA 42 (L) 10/18/2019 1006   CBC    Component Value Date/Time   WBC 23.1 (H) 09/19/2023 0340   RBC 2.47 (L) 09/19/2023 0340   HGB 7.0 (L) 09/19/2023 0340   HGB 7.6 (L) 04/17/2022 1452   HCT 22.7 (L) 09/19/2023 0340   HCT 25.3 (L) 04/17/2022 1452   PLT 388 09/19/2023 0340   PLT 351 04/17/2022 1452   MCV 91.9 09/19/2023 0340   MCV 88 04/17/2022 1452   MCH 28.3 09/19/2023 0340   MCHC 30.8 09/19/2023 0340   RDW 20.7 (H) 09/19/2023 0340   RDW 15.5 (H) 04/17/2022 1452   LYMPHSABS 0.0 (L) 09/07/2023 1936   LYMPHSABS 1.4 02/18/2022 1117   MONOABS 1.7 (H) 09/07/2023 1936   EOSABS 0.0 09/07/2023 1936   EOSABS 0.3 02/18/2022 1117    BASOSABS 0.3 (H) 09/07/2023 1936   BASOSABS 0.1 02/18/2022 1117    OP HD: [facility] MWF (pt says it is M-F)  3h  B400   101kg  2K bath   RUA AVF  Heparin none - hectorol 1 mcg  - mircera 225 mcg q 2 wks, last 3/07       Assessment/ Plan: Gangrene right lower leg: Progressive issue, also nonhealing left BKA site.  Patient underwent bilateral AKA on 09/08/2023.  Per [facility]. ESRD: on HD 2x per week (M-F).  Back on schedule. Chronic hypotension: On midodrine at home, continue here Volume: 4 kg down by wts, which should be the case after sig amputation. 2 L off Friday.  Will need new edw of 97 kg Anemia of esrd:.  Hemoglobin 6.7-8.5 here, has received PRBCs here despite ESA.  Unclear source of loss.  Hgb down to 7, hopefully will receive blood transfusion today. Secondary hyperparathyroidism: Corrected calcium and phos are in range.  PAF Chronic systolic HF Disposition - awaiting SNF placement possible discharge Sunday.  Irena Cords, MD [facility]

## 2023-09-19 NOTE — Plan of Care (Signed)
  Problem: Education: Goal: Ability to describe self-care measures that may prevent or decrease complications (Diabetes Survival Skills Education) will improve Outcome: Progressing   Problem: Coping: Goal: Ability to adjust to condition or change in health will improve Outcome: Progressing   Problem: Fluid Volume: Goal: Ability to maintain a balanced intake and output will improve Outcome: Progressing   Problem: Health Behavior/Discharge Planning: Goal: Ability to manage health-related needs will improve Outcome: Progressing   Problem: Metabolic: Goal: Ability to maintain appropriate glucose levels will improve Outcome: Progressing   Problem: Nutritional: Goal: Maintenance of adequate nutrition will improve Outcome: Progressing   Problem: Skin Integrity: Goal: Risk for impaired skin integrity will decrease Outcome: Progressing   Problem: Tissue Perfusion: Goal: Adequacy of tissue perfusion will improve Outcome: Progressing   Problem: Education: Goal: Knowledge of General Education information will improve Description: Including pain rating scale, medication(s)/side effects and non-pharmacologic comfort measures Outcome: Progressing

## 2023-09-20 DIAGNOSIS — D631 Anemia in chronic kidney disease: Secondary | ICD-10-CM | POA: Diagnosis not present

## 2023-09-20 DIAGNOSIS — Z992 Dependence on renal dialysis: Secondary | ICD-10-CM | POA: Diagnosis not present

## 2023-09-20 DIAGNOSIS — E876 Hypokalemia: Secondary | ICD-10-CM | POA: Diagnosis not present

## 2023-09-20 DIAGNOSIS — L89893 Pressure ulcer of other site, stage 3: Secondary | ICD-10-CM | POA: Diagnosis not present

## 2023-09-20 DIAGNOSIS — N2581 Secondary hyperparathyroidism of renal origin: Secondary | ICD-10-CM | POA: Diagnosis not present

## 2023-09-20 DIAGNOSIS — E0841 Diabetes mellitus due to underlying condition with diabetic mononeuropathy: Secondary | ICD-10-CM

## 2023-09-20 DIAGNOSIS — E785 Hyperlipidemia, unspecified: Secondary | ICD-10-CM | POA: Diagnosis not present

## 2023-09-20 DIAGNOSIS — I10 Essential (primary) hypertension: Secondary | ICD-10-CM | POA: Diagnosis not present

## 2023-09-20 DIAGNOSIS — E569 Vitamin deficiency, unspecified: Secondary | ICD-10-CM | POA: Diagnosis not present

## 2023-09-20 DIAGNOSIS — I5022 Chronic systolic (congestive) heart failure: Secondary | ICD-10-CM | POA: Diagnosis not present

## 2023-09-20 DIAGNOSIS — N186 End stage renal disease: Secondary | ICD-10-CM | POA: Diagnosis not present

## 2023-09-20 DIAGNOSIS — I132 Hypertensive heart and chronic kidney disease with heart failure and with stage 5 chronic kidney disease, or end stage renal disease: Secondary | ICD-10-CM | POA: Diagnosis not present

## 2023-09-20 DIAGNOSIS — G8929 Other chronic pain: Secondary | ICD-10-CM | POA: Diagnosis not present

## 2023-09-20 DIAGNOSIS — E46 Unspecified protein-calorie malnutrition: Secondary | ICD-10-CM | POA: Diagnosis not present

## 2023-09-20 DIAGNOSIS — R52 Pain, unspecified: Secondary | ICD-10-CM | POA: Diagnosis not present

## 2023-09-20 DIAGNOSIS — Z7409 Other reduced mobility: Secondary | ICD-10-CM | POA: Diagnosis not present

## 2023-09-20 DIAGNOSIS — Z89611 Acquired absence of right leg above knee: Secondary | ICD-10-CM | POA: Diagnosis not present

## 2023-09-20 DIAGNOSIS — E1140 Type 2 diabetes mellitus with diabetic neuropathy, unspecified: Secondary | ICD-10-CM | POA: Diagnosis not present

## 2023-09-20 DIAGNOSIS — I480 Paroxysmal atrial fibrillation: Secondary | ICD-10-CM | POA: Diagnosis not present

## 2023-09-20 DIAGNOSIS — Z89612 Acquired absence of left leg above knee: Secondary | ICD-10-CM | POA: Diagnosis not present

## 2023-09-20 DIAGNOSIS — N179 Acute kidney failure, unspecified: Secondary | ICD-10-CM | POA: Diagnosis not present

## 2023-09-20 DIAGNOSIS — Z7401 Bed confinement status: Secondary | ICD-10-CM | POA: Diagnosis not present

## 2023-09-20 DIAGNOSIS — J449 Chronic obstructive pulmonary disease, unspecified: Secondary | ICD-10-CM | POA: Diagnosis not present

## 2023-09-20 DIAGNOSIS — L89153 Pressure ulcer of sacral region, stage 3: Secondary | ICD-10-CM | POA: Diagnosis not present

## 2023-09-20 DIAGNOSIS — M726 Necrotizing fasciitis: Secondary | ICD-10-CM | POA: Diagnosis not present

## 2023-09-20 DIAGNOSIS — M6281 Muscle weakness (generalized): Secondary | ICD-10-CM | POA: Diagnosis not present

## 2023-09-20 DIAGNOSIS — E119 Type 2 diabetes mellitus without complications: Secondary | ICD-10-CM | POA: Diagnosis not present

## 2023-09-20 DIAGNOSIS — Z4781 Encounter for orthopedic aftercare following surgical amputation: Secondary | ICD-10-CM | POA: Diagnosis not present

## 2023-09-20 DIAGNOSIS — Z951 Presence of aortocoronary bypass graft: Secondary | ICD-10-CM | POA: Diagnosis not present

## 2023-09-20 DIAGNOSIS — I96 Gangrene, not elsewhere classified: Secondary | ICD-10-CM | POA: Diagnosis not present

## 2023-09-20 DIAGNOSIS — E1165 Type 2 diabetes mellitus with hyperglycemia: Secondary | ICD-10-CM | POA: Diagnosis not present

## 2023-09-20 DIAGNOSIS — Z7189 Other specified counseling: Secondary | ICD-10-CM | POA: Diagnosis not present

## 2023-09-20 DIAGNOSIS — A419 Sepsis, unspecified organism: Secondary | ICD-10-CM | POA: Diagnosis not present

## 2023-09-20 DIAGNOSIS — R531 Weakness: Secondary | ICD-10-CM | POA: Diagnosis not present

## 2023-09-20 DIAGNOSIS — Z9989 Dependence on other enabling machines and devices: Secondary | ICD-10-CM | POA: Diagnosis not present

## 2023-09-20 LAB — CBC
HCT: 26.7 % — ABNORMAL LOW (ref 39.0–52.0)
Hemoglobin: 8.3 g/dL — ABNORMAL LOW (ref 13.0–17.0)
MCH: 27.7 pg (ref 26.0–34.0)
MCHC: 31.1 g/dL (ref 30.0–36.0)
MCV: 89 fL (ref 80.0–100.0)
Platelets: 462 10*3/uL — ABNORMAL HIGH (ref 150–400)
RBC: 3 MIL/uL — ABNORMAL LOW (ref 4.22–5.81)
RDW: 21.2 % — ABNORMAL HIGH (ref 11.5–15.5)
WBC: 21.2 10*3/uL — ABNORMAL HIGH (ref 4.0–10.5)
nRBC: 0.1 % (ref 0.0–0.2)

## 2023-09-20 LAB — RENAL FUNCTION PANEL
Albumin: 1.9 g/dL — ABNORMAL LOW (ref 3.5–5.0)
Anion gap: 9 (ref 5–15)
BUN: 48 mg/dL — ABNORMAL HIGH (ref 8–23)
CO2: 25 mmol/L (ref 22–32)
Calcium: 8.1 mg/dL — ABNORMAL LOW (ref 8.9–10.3)
Chloride: 98 mmol/L (ref 98–111)
Creatinine, Ser: 3.8 mg/dL — ABNORMAL HIGH (ref 0.61–1.24)
GFR, Estimated: 16 mL/min — ABNORMAL LOW (ref >60)
Glucose, Bld: 136 mg/dL — ABNORMAL HIGH (ref 70–99)
Phosphorus: 2.9 mg/dL (ref 2.5–4.6)
Potassium: 3.6 mmol/L (ref 3.5–5.1)
Sodium: 132 mmol/L — ABNORMAL LOW (ref 135–145)

## 2023-09-20 LAB — TYPE AND SCREEN
ABO/RH(D): A POS
Antibody Screen: NEGATIVE
Unit division: 0
Unit division: 0

## 2023-09-20 LAB — BPAM RBC
Blood Product Expiration Date: 202504022359
Blood Product Expiration Date: 202504262359
ISSUE DATE / TIME: 202503261025
ISSUE DATE / TIME: 202503291312
Unit Type and Rh: 6200
Unit Type and Rh: 6200

## 2023-09-20 LAB — GLUCOSE, CAPILLARY
Glucose-Capillary: 126 mg/dL — ABNORMAL HIGH (ref 70–99)
Glucose-Capillary: 133 mg/dL — ABNORMAL HIGH (ref 70–99)

## 2023-09-20 MED ORDER — INSULIN ASPART 100 UNIT/ML IJ SOLN: 0.0000 [IU] | Freq: Three times a day (TID) | INTRAMUSCULAR | Status: DC

## 2023-09-20 MED ORDER — HYDROCODONE-ACETAMINOPHEN 5-325 MG PO TABS: 1.0000 | ORAL_TABLET | Freq: Four times a day (QID) | ORAL | 0 refills | Status: DC | PRN

## 2023-09-20 MED ORDER — INSULIN GLARGINE 100 UNIT/ML ~~LOC~~ SOLN: 18.0000 [IU] | Freq: Every day | SUBCUTANEOUS | Status: DC

## 2023-09-20 NOTE — TOC Transition Note (Addendum)
 Transition of Care [facility]) - Discharge Note   Patient Details  Name: [name] [name] MRN: [id number] Date of Birth: January 14, 1956  [facility] CM/SW Contact:  Patrice Paradise, LCSW Phone Number: 09/20/2023, 2:47 PM   Clinical Narrative:    Patient will DC to:?[facility] Anticipated DC date:?09/20/3023 Transport by: Sharin Mons   Per MD patient ready for DC to [facility]. RN, patient, patient's family, and facility notified of DC. Discharge Summary sent to facility. RN given number for report [phone]. DC packet on chart. Ambulance transport requested for patient.   CSW signing off.   Judd Lien, [place] [phone]    Final next level of care: Skilled Nursing Facility Barriers to Discharge: Barriers Resolved   Patient Goals and CMS Choice Patient states their goals for this hospitalization and ongoing recovery are:: To get better          Discharge Placement              Patient chooses bed at: [facility] Patient to be transferred to facility by: [facility]   Patient and family notified of of transfer: 09/20/23  Discharge Plan and Services Additional resources added to the After Visit Summary for   In-house Referral: Clinical Social Work                DME Agency: [employer]                  Social Drivers of Health (SDOH) Interventions SDOH Screenings   Food Insecurity: No Food Insecurity (09/08/2023)  Housing: Low Risk  (09/08/2023)  Transportation Needs: No Transportation Needs (09/08/2023)  Utilities: Not At Risk (09/08/2023)  Social Connections: Moderately Isolated (09/08/2023)  Tobacco Use: Medium Risk (09/08/2023)     Readmission Risk Interventions    01/29/2023   11:34 AM  Readmission Risk Prevention Plan  Transportation Screening Complete  PCP or Specialist Appt within 5-7 Days Complete  Home Care Screening Complete  Medication Review (RN CM) Complete

## 2023-09-20 NOTE — Discharge Summary (Signed)
 Physician Discharge Summary   Patient: [name] [name] MRN: [id number] DOB: Aug 02, 1955  Admit date:     09/07/2023  Discharge date: 09/20/23  Discharge Physician: Thad Ranger, MD    PCP: [name], PA-C   Recommendations at discharge:   Wound care dressings as per instruction Continue hemodialysis twice a week, Monday and Friday  Discharge Diagnoses:    Gangrene of bilateral lower extremity (HCC) status post above-knee amputations on 3/18  End stage renal disease (HCC), Monday and Friday   Mixed hyperlipidemia   Paroxysmal atrial fibrillation (HCC)   Chronic diastolic CHF (congestive heart failure) (HCC)   S/P CABG x 3    Acute blood loss anemia on anemia of chronic illness, ESRD Diabetes mellitus type 2, IDDM, uncontrolled with hyperglycemia OSA on CPAP nightly Pressure injury, coccyx stage III, POA, left buttock stage II, POA  Hospital Course:  Patient is a 68 year old male with ESRD on HD MF, anemia, HTN, CAD status post CABG, DM type II, CHF with EF 30%, OSA on CPAP, paroxysmal A-fib, PAD with history of critical limb ischemia status post arthrectomy and stenting of left SFA and popliteal artery on 12/29/2022 by Dr. Randie Heinz followed by left BKA on 01/26/2023, right heel ulcer leading to right common femoral to below-knee popliteal artery bypass on 03/19/2023 presented to ED with ulcer on the right heel and foul-smelling discharge, difficulty with healing of his left BKA. In ED, seen by vascular surgery and was noted to have severe necrotic changes to the right lower extremities and nonhealing BKA on the left. Patient was also febrile, slightly hypotensive, lactic acid 2.5.  Chest x-ray showed masslike opacity in the suprahilar region of the left. Right foot x-ray showed diffuse soft tissue edema and extensive subcu gas with destructive changes of the first metatarsal joint likely osteomyelitis and septic arthritis. Patient was admitted for further workup Underwent bilateral  above-knee amputations, antibiotics completed for 48 hours postop, awaiting placement     Assessment and Plan:  Nonhealing right lower extremity ulcer with progressive gangrene  History of severe PAD with multiple interventions in the past -MRI of the right ankle and foot with osteomyelitis and soft tissue ulceration -Vascular surgery consulted and patient underwent right above-knee amputation on 09/08/2023.  Received 48 hours of antibiotics. -Blood cultures negative so far. -Leukocytosis improving, no fevers, not on any steroids, blood cultures negative so far.    Nonhealing left below-knee amputation site -   Patient underwent left above-knee amputation on 09/08/2023.  Will need to follow-up with vascular surgery as outpatient.   ESRD MF -Patient is on ESRD on hemodialysis 2x per week M, F -Nephrology  was consulted, patient underwent hemodialysis per his schedule -Continue midodrine     Hypokalemia -Replace as needed   Acute blood loss anemia on anemia of chronic kidney disease.  -baseline hemoglobin has been 7.3-7.5, received 1 unit packed RBCs on 3/26, 3/29 -Hemoglobin 8.3 at discharge     History of hypotension  BP soft, likely due to anemia - continue midodrine    CAD s/p CABG No active issues at this time.  Continue Lipitor   Diabetes mellitus type 2.  IDDM, uncontrolled with hyperglycemia -Hemoglobin A1c 6.1 on 09/07/2023, on Trulicity and Lantus 20 units twice daily at home   -Continue sliding scale insulin, sensitive, Lantus 18 units at bedtime, titrate up as needed    Chronic systolic heart failure. -Volume management with HD   OSA Continue CPAP at bedtime.  Continue inhalers.   Paroxysmal atrial  fibrillation. Continue Eliquis and amiodarone   COPD -continue  bronchodilators.  Stable, no wheezing   Pressure injury documentation Coccyx with stage III, POA, Left buttock stage II, POA, foam dressing     Estimated body mass index is 30.62 kg/m as  calculated from the following:   Height as of this encounter: 5\' 10"  (1.778 m).   Weight as of this encounter: 96.8 kg.       Pain control - [employer] Controlled Substance Reporting System database was reviewed. and patient was instructed, not to drive, operate heavy machinery, perform activities at heights, swimming or participation in water activities or provide baby-sitting services while on Pain, Sleep and Anxiety Medications; until their outpatient Physician has advised to do so again. Also recommended to not to take more than prescribed Pain, Sleep and Anxiety Medications.  Consultants: Nephrology, vascular surgery Procedures performed: Bilateral above-knee amputations on 3/18 Disposition: Skilled nursing facility Diet recommendation:  Discharge Diet Orders (From admission, onward)     Start     Ordered   09/20/23 0000  Diet Carb Modified        09/20/23 1003            DISCHARGE MEDICATION: Allergies as of 09/20/2023       Reactions   Tape Rash   Plastic tape -needs paper tape        Medication List     STOP taking these medications    torsemide 20 MG tablet Commonly known as: DEMADEX   Trulicity 3 MG/0.5ML Soaj Generic drug: Dulaglutide       TAKE these medications    Accu-Chek Guide Me w/Device Kit 1 Device by Other route 3 (three) times daily.   Accu-Chek Softclix Lancets lancets 1 each by Other route daily.   albuterol 108 (90 Base) MCG/ACT inhaler Commonly known as: VENTOLIN HFA Inhale 2 puffs into the lungs every 6 (six) hours as needed for wheezing or shortness of breath.   amiodarone 200 MG tablet Commonly known as: PACERONE TAKE 1 TABLET TWICE DAILY   apixaban 5 MG Tabs tablet Commonly known as: Eliquis Take 1 tablet (5 mg total) by mouth 2 (two) times daily.   atorvastatin 80 MG tablet Commonly known as: LIPITOR Take 1 tablet (80 mg total) by mouth daily.   BD Pen Needle Nano U/F 32G X 4 MM Misc Generic drug: Insulin Pen  Needle Use to inject Levemir 2 (two) times daily.   Dialyvite 800 0.8 MG Tabs Take 0.8 mg by mouth daily.   fluticasone furoate-vilanterol 200-25 MCG/ACT Aepb Commonly known as: BREO ELLIPTA Inhale 2 puffs into the lungs daily.   glucose blood test strip 1 each by Other route 3 (three) times daily.   HYDROcodone-acetaminophen 5-325 MG tablet Commonly known as: NORCO/VICODIN Take 1-2 tablets by mouth every 6 (six) hours as needed for moderate pain (pain score 4-6) or severe pain (pain score 7-10).   insulin aspart 100 UNIT/ML injection Commonly known as: novoLOG Inject 0-9 Units into the skin 3 (three) times daily with meals. Sliding scale CBG 70 - 120: 0 units CBG 121 - 150: 1 unit,  CBG 151 - 200: 2 units,  CBG 201 - 250: 3 units,  CBG 251 - 300: 5 units,  CBG 301 - 350: 7 units,  CBG 351 - 400: 9 units   CBG > 400: 9 units and notify your MD   insulin glargine 100 UNIT/ML injection Commonly known as: LANTUS Inject 0.18 mLs (18 Units total) into the  skin at bedtime. What changed:  how much to take when to take this   midodrine 5 MG tablet Commonly known as: PROAMATINE TAKE 1 TABLET EVERY DAY   montelukast 10 MG tablet Commonly known as: SINGULAIR Take 10 mg by mouth daily.               Discharge Care Instructions  (From admission, onward)           Start     Ordered   09/20/23 0000  Discharge wound care:       Comments: Wound care  Daily      Comments: Cleanse sacral wound with soap and water and pat dry. Apply VASHE moist gauze to nonintact skin and cover with silicone foam dressing.  Change daily and PRN soilage.  Blisters to left thigh:  cover with nonadherent MEPITEL contact layer   Wound care as needed, dry dressing changes as needed to bilateral AKA's for any bloody drainage.   09/20/23 1003            Follow-up Information     [facility] Follow up in 4 week(s).   Specialty: Vascular Surgery Why:  Office will call to arrange your appt(s) Contact information: [address] [place] [address] [phone]        [name], PA-C. Schedule an appointment as soon as possible for a visit in 2 week(s).   Specialty: Physician Assistant Why: for hospital follow-up Contact information: [address] [place] [address] [phone]                Discharge Exam: Ceasar Mons Weights   09/16/23 0500 09/18/23 0822 09/18/23 1136  Weight: 93.8 kg 96.8 kg 96.8 kg   S: "I am ready to go to the rehab" no acute complaints.  BP 125/71 (BP Location: Left Wrist)   Pulse 70   Temp 99.1 F (37.3 C) (Oral)   Resp 17   Ht 5\' 10"  (1.778 m)   Wt 96.8 kg   SpO2 96%   BMI 30.62 kg/m   Physical Exam General: Alert and oriented x 3, NAD Cardiovascular: S1 S2 clear, RRR.  Respiratory: CTAB, no wheezing Gastrointestinal: Soft, nontender, nondistended, NBS Ext: bilateral AKA, no drainage or bleeding Psych: Normal affect    Condition at discharge: fair  The results of significant diagnostics from this hospitalization (including imaging, microbiology, ancillary and laboratory) are listed below for reference.   Imaging Studies: MR ANKLE RIGHT W WO CONTRAST Result Date: 09/08/2023 CLINICAL DATA:  Foot wound with radiographic findings consistent with osteomyelitis and gas-forming infection in the forefoot. Concern for necrotizing fasciitis. EXAM: MRI OF THE RIGHT ANKLE WITHOUT AND WITH CONTRAST TECHNIQUE: Multiplanar, multisequence MR imaging of the ankle was performed before and after the administration of intravenous contrast. CONTRAST:  10mL GADAVIST GADOBUTROL 1 MMOL/ML IV SOLN COMPARISON:  Foot radiographs 09/07/2023 and 05/07/2020. MRI 05/21/2020. FINDINGS: Forefoot findings dictated separately TENDONS Peroneal: Intact and normally positioned. Posteromedial: Intact and normally positioned. Anterior: There is tendinosis and high-grade partial tearing of the tibialis  anterior tendon distally near its insertion. No significant tenosynovitis. The additional anterior extensor tendons appear normal. Achilles: Intact. Plantar Fascia: Intact. LIGAMENTS Lateral: The anterior and posterior talofibular and calcaneofibular ligaments are intact.The inferior tibiofibular ligaments appear intact. Medial: The deltoid and visualized portions of the spring ligament appear intact. CARTILAGE AND BONES Ankle Joint: No significant ankle joint effusion or suspicious synovial enhancement. The talar dome and tibial plafond  appear normal. Subtalar Joints/Sinus Tarsi: Unremarkable. Bones: No evidence of acute fracture, dislocation or osteonecrosis within the ankle or hindfoot. Midfoot and forefoot findings are dictated separately. There are new findings suspicious for osteomyelitis at the 1st tarsometatarsal joint. Other: Nonspecific diffuse soft tissue edema and enhancement throughout the ankle and hindfoot. No focal fluid collections or definite soft tissue emphysema identified within the ankle. IMPRESSION: IMPRESSION 1. Nonspecific diffuse soft tissue edema and enhancement throughout the ankle and hindfoot, suspicious for cellulitis. No focal fluid collections or definite soft tissue emphysema identified within the ankle. 2. New findings suspicious for osteomyelitis at the 1st tarsometatarsal joint. See additional details on separate report for the forefoot. 3. Tendinosis and high-grade partial tearing of the tibialis anterior tendon distally near its insertion. The additional ankle tendons and ligaments appear intact. 4. Forefoot findings dictated separately. Electronically Signed   By: Carey Bullocks M.D.   On: 09/08/2023 08:49   MR FOOT RIGHT W WO CONTRAST Result Date: 09/08/2023 CLINICAL DATA:  Foot wound with radiographic findings consistent with osteomyelitis and gas-forming infection in the forefoot. Concern for necrotizing fasciitis. EXAM: MRI OF THE RIGHT FOREFOOT WITHOUT AND WITH  CONTRAST TECHNIQUE: Multiplanar, multisequence MR imaging of the right forefoot was performed before and after the administration of intravenous contrast. CONTRAST:  10mL GADAVIST GADOBUTROL 1 MMOL/ML IV SOLN COMPARISON:  Radiographs same date. Concurrent MRI of the right ankle and hindfoot. FINDINGS: Ankle and hindfoot findings dictated separately. Bones/Joint/Cartilage As demonstrated radiographically, there are erosive changes with cortical destruction, T2 hyperintensity and heterogeneous enhancement within the 1st metatarsal head, adjacent base of the 1st proximal phalanx and head of the 5th metatarsal, suspicious for osteomyelitis. Similar additional changes are present at the 1st tarsometatarsal joint, also suspicious for osteomyelitis. There is less specific mild edema and enhancement within the middle cuneiform and base of the 2nd metatarsal which could be arthropathic. There is a small effusion of the 1st metatarsophalangeal joint which appears to communicate with a large open wound dorsally. The alignment is normal at the Lisfranc joint. Ligaments Intact Lisfranc ligament. The collateral ligaments of the 1st metatarsophalangeal joint are not well visualized. Muscles and Tendons As seen on the ankle examination, there is a high-grade partial tear of the distal anterior tibialis tendon. In addition, the extensor hallucis longus tendon appears completely ruptured at the level of the large wound dorsal to the 1st metatarsal. Generalized muscular atrophy with soft tissue edema and heterogeneous enhancement. Soft tissues As above, large area of soft tissue ulceration along the dorsal aspect of the 1st metatarsophalangeal joint with an underlying fluid collection which demonstrates peripheral enhancement and appears to communicate with the joint. This fluid collection measures approximately 1.6 x 1.2 x 2.4 cm. Additional areas of soft tissue ulceration are noted medial to the 1st MTP joint. As above, there is  heterogeneous enhancement throughout the forefoot soft tissues with scattered soft tissue emphysema and large areas of poor enhancement in the lateral and plantar aspects of the foot, highly suspicious for devitalized tissue and necrotizing soft tissue infection. IMPRESSION: 1. Findings are highly suspicious for osteomyelitis involving the 1st metatarsal head, adjacent base of the 1st proximal phalanx and head of the 5th metatarsal. Similar additional changes are present at the 1st tarsometatarsal joint, also suspicious for osteomyelitis. 2. Large area of soft tissue ulceration along the dorsal aspect of the 1st metatarsophalangeal joint with an underlying fluid collection which appears to communicate with the joint, suspicious for an abscess. Additional areas of soft tissue ulceration are  noted medial to the 1st MTP joint. 3. Heterogeneous enhancement throughout the forefoot soft tissues with scattered soft tissue emphysema and large areas of poor enhancement in the lateral and plantar aspects of the foot, highly suspicious for devitalized tissue and necrotizing soft tissue infection. 4. High-grade partial tear of the distal anterior tibialis tendon. 5. Complete rupture of the extensor hallucis longus tendon at the level of the large wound dorsal to the 1st metatarsal. 6. Ankle and hindfoot findings dictated separately. Electronically Signed   By: Carey Bullocks M.D.   On: 09/08/2023 08:49   DG Foot Complete Right Result Date: 09/07/2023 CLINICAL DATA:  Wound EXAM: RIGHT FOOT COMPLETE - 3+ VIEW COMPARISON:  04/27/2020 FINDINGS: Frontal, oblique, lateral views of the right foot are obtained. There is diffuse soft tissue edema, with extensive subcutaneous gas throughout the forefoot compatible with gas-forming infection. There is bone destruction along the first metatarsal head, base of the first proximal phalanx, and fifth metatarsal head consistent with osteomyelitis. There are no acute fractures. Diffuse  osteoarthritis. Extensive atherosclerosis. IMPRESSION: 1. Diffuse soft tissue edema compatible with cellulitis. Extensive subcutaneous gas throughout the forefoot consistent with gas forming infection. 2. Destructive changes surrounding the first metatarsophalangeal joint consistent with osteomyelitis and likely septic arthritis. Additional bony destruction of the fifth metatarsal head consistent with osteomyelitis. Electronically Signed   By: Sharlet Salina M.D.   On: 09/07/2023 17:13   DG Chest 2 View Result Date: 09/07/2023 CLINICAL DATA:  Systemic inflammatory response syndrome. EXAM: CHEST - 2 VIEW COMPARISON:  One-view x-ray 02/05/2023 FINDINGS: Status post median sternotomy. Stable cardiopericardial silhouette enlargement. Slight prominence of central vasculature. No pneumothorax or effusion. X-rays are limited. The upper aspect of the chest is clipped off the edge of the film on the frontal view and the inferior costophrenic angles are clipped on the lateral. Is also masslike opacity left perihilar. Underlying lesion is possible. Recommend follow-up chest CT with contrast when appropriate. IMPRESSION: Limited x-rays.  Postop chest. New masslike opacity along the suprahilar region on the left. Recommend a follow up chest CT with contrast to further delineate and exclude underlying neoplasm or other aggressive lesion when appropriate. Electronically Signed   By: Karen Kays M.D.   On: 09/07/2023 17:13    Microbiology: Results for orders placed or performed during the hospital encounter of 09/07/23  Culture, blood (Routine x 2)     Status: None   Collection Time: 09/07/23  3:52 PM   Specimen: BLOOD  Result Value Ref Range Status   Specimen Description BLOOD SITE NOT SPECIFIED  Final   Special Requests   Final    BOTTLES DRAWN AEROBIC AND ANAEROBIC Blood Culture results may not be optimal due to an inadequate volume of blood received in culture bottles   Culture   Final    NO GROWTH 5  DAYS Performed at [facility], [address], [place], [place] [address]    Report Status 09/12/2023 FINAL  Final  Culture, blood (Routine x 2)     Status: None   Collection Time: 09/07/23  7:36 PM   Specimen: BLOOD  Result Value Ref Range Status   Specimen Description BLOOD SITE NOT SPECIFIED  Final   Special Requests   Final    BOTTLES DRAWN AEROBIC AND ANAEROBIC Blood Culture results may not be optimal due to an inadequate volume of blood received in culture bottles   Culture   Final    NO GROWTH 5 DAYS Performed at [facility], [address] [place], [place] [address]    Report Status 09/12/2023 FINAL  Final  Resp panel by RT-PCR (RSV, Flu A&B, Covid) Peripheral     Status: None   Collection Time: 09/07/23  7:36 PM   Specimen: Peripheral; Nasal Swab  Result Value Ref Range Status   SARS Coronavirus 2 by RT PCR NEGATIVE NEGATIVE Final   Influenza A by PCR NEGATIVE NEGATIVE Final   Influenza B by PCR NEGATIVE NEGATIVE Final    Comment: (NOTE) The Xpert Xpress SARS-CoV-2/FLU/RSV plus assay is intended as an aid in the diagnosis of influenza from Nasopharyngeal swab specimens and should not be used as a sole basis for treatment. Nasal washings and aspirates are unacceptable for Xpert Xpress SARS-CoV-2/FLU/RSV testing.  Fact Sheet for Patients: BloggerCourse.com  Fact Sheet for Healthcare Providers: SeriousBroker.it  This test is not yet approved or cleared by the [place] FDA and has been authorized for detection and/or diagnosis of SARS-CoV-2 by FDA under an Emergency Use Authorization (EUA). This EUA will remain in effect (meaning this test can be used) for the duration of the COVID-19 declaration under Section 564(b)(1) of the Act, 21 U.S.C. section 360bbb-3(b)(1), unless the authorization is terminated or revoked.     Resp Syncytial Virus by PCR NEGATIVE NEGATIVE Final    Comment: (NOTE) Fact  Sheet for Patients: BloggerCourse.com  Fact Sheet for Healthcare Providers: SeriousBroker.it  This test is not yet approved or cleared by the [place] FDA and has been authorized for detection and/or diagnosis of SARS-CoV-2 by FDA under an Emergency Use Authorization (EUA). This EUA will remain in effect (meaning this test can be used) for the duration of the COVID-19 declaration under Section 564(b)(1) of the Act, 21 U.S.C. section 360bbb-3(b)(1), unless the authorization is terminated or revoked.  Performed at [facility], [address], [place], [place] [address]   Culture, blood (Routine X 2) w Reflex to ID Panel     Status: None (Preliminary result)   Collection Time: 09/19/23 12:09 PM   Specimen: BLOOD LEFT HAND  Result Value Ref Range Status   Specimen Description BLOOD LEFT HAND  Final   Special Requests   Final    BOTTLES DRAWN AEROBIC AND ANAEROBIC Blood Culture adequate volume   Culture   Final    NO GROWTH < 24 HOURS Performed at [facility], [address] [place], [place] [address]    Report Status PENDING  Incomplete  Culture, blood (Routine X 2) w Reflex to ID Panel     Status: None (Preliminary result)   Collection Time: 09/19/23 12:16 PM   Specimen: BLOOD LEFT ARM  Result Value Ref Range Status   Specimen Description BLOOD LEFT ARM  Final   Special Requests   Final    AEROBIC BOTTLE ONLY Blood Culture results may not be optimal due to an inadequate volume of blood received in culture bottles   Culture   Final    NO GROWTH < 24 HOURS Performed at [facility], [address] [place], [place] [address]    Report Status PENDING  Incomplete    Labs: CBC: Recent Labs  Lab 09/16/23 0333 09/16/23 1624 09/17/23 0344 09/18/23 0923 09/19/23 0340 09/20/23 0330  WBC 16.0*  --  18.2* 21.6* 23.1* 21.2*  HGB 6.4* 7.7* 7.6* 7.2* 7.0* 8.3*  HCT 20.3* 23.8* 24.2* 22.7* 22.7* 26.7*  MCV  87.5  --  89.3 90.8 91.9 89.0  PLT 344  --  312 366 388 462*   Basic Metabolic Panel: Recent Labs  Lab 09/16/23 0333 09/17/23 0344 09/18/23 0924 09/19/23 0340 09/20/23 0330  NA 132* 132* 133* 132* 132*  K 3.0* 3.5 3.8 3.8 3.6  CL 98 100 100 98 98  CO2 26 23 23 24 25   GLUCOSE 162* 171* 217* 152* 136*  BUN 42* 48* 56* 36* 48*  CREATININE 5.10* 5.27* 5.08* 3.38* 3.80*  CALCIUM 7.0* 7.1* 7.4* 7.6* 8.1*  MG 1.9  --   --   --   --   PHOS  --  2.5 2.4* 2.5 2.9   Liver Function Tests: Recent Labs  Lab 09/17/23 0344 09/18/23 0924 09/19/23 0340 09/20/23 0330  ALBUMIN 1.7* 1.6* 1.8* 1.9*   CBG: Recent Labs  Lab 09/19/23 0619 09/19/23 1123 09/19/23 1624 09/19/23 2137 09/20/23 0608  GLUCAP 144* 284* 162* 122* 126*    Discharge time spent: greater than 30 minutes.  Signed: Thad Ranger, MD [facility] 09/20/2023

## 2023-09-20 NOTE — TOC Progression Note (Addendum)
 Transition of Care [facility]) - Progression Note    Patient Details  Name: [name] [name] MRN: [id number] Date of Birth: 04-29-1956  Transition of Care [facility]) CM/SW Contact  Patrice Paradise, LCSW Phone Number: 09/20/2023, 9:59 AM  Clinical Narrative:     CSW called Kia at [facility] to confirm bed for today. Kia informed CSW that she has a impending discharge today and will follow when it occurs. Kia informed CSW that pt will have to bring his CPAP from home.  TOC team will continue to assist with discharge planning needs.  Expected Discharge Plan: Skilled Nursing Facility Barriers to Discharge: Insurance Authorization  Expected Discharge Plan and Services In-house Referral: Clinical Social Work     Living arrangements for the past 2 months: Single Family Home                   DME Agency: [employer]                   Social Determinants of Health (SDOH) Interventions SDOH Screenings   Food Insecurity: No Food Insecurity (09/08/2023)  Housing: Low Risk  (09/08/2023)  Transportation Needs: No Transportation Needs (09/08/2023)  Utilities: Not At Risk (09/08/2023)  Social Connections: Moderately Isolated (09/08/2023)  Tobacco Use: Medium Risk (09/08/2023)    Readmission Risk Interventions    01/29/2023   11:34 AM  Readmission Risk Prevention Plan  Transportation Screening Complete  PCP or Specialist Appt within 5-7 Days Complete  Home Care Screening Complete  Medication Review (RN CM) Complete

## 2023-09-20 NOTE — Progress Notes (Signed)
 Patient ID: [name] [name], male   DOB: 1956/06/20, 68 y.o.   MRN: [id number] S: no complaints and reports that he is waiting for the ambulance to take him to his SNF O:BP 125/71 (BP Location: Left Wrist)   Pulse 70   Temp 99.1 F (37.3 C) (Oral)   Resp 17   Ht 5\' 10"  (1.778 m)   Wt 96.8 kg   SpO2 96%   BMI 30.62 kg/m   Intake/Output Summary (Last 24 hours) at 09/20/2023 0938 Last data filed at 09/20/2023 0800 Gross per 24 hour  Intake 550 ml  Output 110 ml  Net 440 ml   Intake/Output: I/O last 3 completed shifts: In: 428 [P.O.:118; Blood:310] Out: 210 [Urine:210]  Intake/Output this shift:  Total I/O In: 240 [P.O.:240] Out: -  Weight change:  Gen: NAD CVS: RRR Resp:CTA Abd: +BS, soft, NT, obese Ext: s/p bilateral AKA's, RUE AVF +T/B  Recent Labs  Lab 09/16/23 0333 09/17/23 0344 09/18/23 0924 09/19/23 0340 09/20/23 0330  NA 132* 132* 133* 132* 132*  K 3.0* 3.5 3.8 3.8 3.6  CL 98 100 100 98 98  CO2 26 23 23 24 25   GLUCOSE 162* 171* 217* 152* 136*  BUN 42* 48* 56* 36* 48*  CREATININE 5.10* 5.27* 5.08* 3.38* 3.80*  ALBUMIN  --  1.7* 1.6* 1.8* 1.9*  CALCIUM 7.0* 7.1* 7.4* 7.6* 8.1*  PHOS  --  2.5 2.4* 2.5 2.9   Liver Function Tests: Recent Labs  Lab 09/18/23 0924 09/19/23 0340 09/20/23 0330  ALBUMIN 1.6* 1.8* 1.9*   No results for input(s): "LIPASE", "AMYLASE" in the last 168 hours. No results for input(s): "AMMONIA" in the last 168 hours. CBC: Recent Labs  Lab 09/16/23 0333 09/16/23 1624 09/17/23 0344 09/18/23 0923 09/19/23 0340 09/20/23 0330  WBC 16.0*  --  18.2* 21.6* 23.1* 21.2*  HGB 6.4*   < > 7.6* 7.2* 7.0* 8.3*  HCT 20.3*   < > 24.2* 22.7* 22.7* 26.7*  MCV 87.5  --  89.3 90.8 91.9 89.0  PLT 344  --  312 366 388 462*   < > = values in this interval not displayed.   Cardiac Enzymes: No results for input(s): "CKTOTAL", "CKMB", "CKMBINDEX", "TROPONINI" in the last 168 hours. CBG: Recent Labs  Lab 09/19/23 0619 09/19/23 1123  09/19/23 1624 09/19/23 2137 09/20/23 0608  GLUCAP 144* 284* 162* 122* 126*    Iron Studies: No results for input(s): "IRON", "TIBC", "TRANSFERRIN", "FERRITIN" in the last 72 hours. Studies/Results: No results found.  sodium chloride   Intravenous Once   sodium chloride   Intravenous Once   amiodarone  200 mg Oral BID   apixaban  5 mg Oral BID   atorvastatin  80 mg Oral Daily   Chlorhexidine Gluconate Cloth  6 each Topical Q0600   Chlorhexidine Gluconate Cloth  6 each Topical Q0600   darbepoetin (ARANESP) injection - DIALYSIS  200 mcg Subcutaneous Q Tue-1800   doxercalciferol  1 mcg Intravenous Once per day on Monday Friday   fluticasone furoate-vilanterol  2 puff Inhalation Daily   insulin aspart  0-9 Units Subcutaneous TID WC   insulin aspart  3 Units Subcutaneous TID WC   insulin glargine-yfgn  15 Units Subcutaneous QHS   midodrine  5 mg Oral Daily   montelukast  10 mg Oral Daily   multivitamin  1 tablet Oral Daily    BMET    Component Value Date/Time   NA 132 (L) 09/20/2023 0330   NA 139 04/17/2022  1452   K 3.6 09/20/2023 0330   CL 98 09/20/2023 0330   CO2 25 09/20/2023 0330   GLUCOSE 136 (H) 09/20/2023 0330   BUN 48 (H) 09/20/2023 0330   BUN 42 (H) 04/17/2022 1452   CREATININE 3.80 (H) 09/20/2023 0330   CALCIUM 8.1 (L) 09/20/2023 0330   GFRNONAA 16 (L) 09/20/2023 0330   GFRAA 42 (L) 10/18/2019 1006   CBC    Component Value Date/Time   WBC 21.2 (H) 09/20/2023 0330   RBC 3.00 (L) 09/20/2023 0330   HGB 8.3 (L) 09/20/2023 0330   HGB 7.6 (L) 04/17/2022 1452   HCT 26.7 (L) 09/20/2023 0330   HCT 25.3 (L) 04/17/2022 1452   PLT 462 (H) 09/20/2023 0330   PLT 351 04/17/2022 1452   MCV 89.0 09/20/2023 0330   MCV 88 04/17/2022 1452   MCH 27.7 09/20/2023 0330   MCHC 31.1 09/20/2023 0330   RDW 21.2 (H) 09/20/2023 0330   RDW 15.5 (H) 04/17/2022 1452   LYMPHSABS 0.0 (L) 09/07/2023 1936   LYMPHSABS 1.4 02/18/2022 1117   MONOABS 1.7 (H) 09/07/2023 1936   EOSABS 0.0  09/07/2023 1936   EOSABS 0.3 02/18/2022 1117   BASOSABS 0.3 (H) 09/07/2023 1936   BASOSABS 0.1 02/18/2022 1117    OP HD: [facility] MWF (pt says it is M-F)  3h  B400   101kg  2K bath   RUA AVF  Heparin none - hectorol 1 mcg  - mircera 225 mcg q 2 wks, last 3/07       Assessment/ Plan: Gangrene right lower leg: Progressive issue, also nonhealing left BKA site.  Patient underwent bilateral AKA on 09/08/2023.  Per [facility]. ESRD: on HD 2x per week (M-F).  Back on schedule. Chronic hypotension: On midodrine at home, continue here Volume: 4 kg down by wts, which should be the case after sig amputation. 2 L off Friday.  Will need new edw of 97 kg Anemia of esrd:.  Hemoglobin 6.7-8.5 here, has received PRBCs here despite ESA.  Unclear source of loss.  Hgb down to 7, hopefully will receive blood transfusion today. Secondary hyperparathyroidism: Corrected calcium and phos are in range.  PAF Chronic systolic HF Disposition - awaiting SNF placement possible discharge today.  Irena Cords, MD [employer] [phone]

## 2023-09-20 NOTE — Discharge Planning (Signed)
 [facility] Patient Discharge Orders- [facility] CLINIC: [place]  Patient's name: [name] [name] Admit/DC Dates: 09/07/2023 - 09/20/2023  Discharge Diagnoses: Gangrene of bilateral lower extremity (HCC) status post above-knee amputations on 3/18    ABLA Anemia  Aranesp: Given: Yes   Date and amount of last dose: 200 mcg SQ 09/15/2023  Last Hgb: 8.3 PRBC's Given: Yes Date/# of units: 3 units 09/08/2023, 1 unit 09/09/2023 1 unit 09/16/2023 1 unit 09/19/2023 ESA dose for discharge: mircera 225 mcg IV q 2 weeks  IV Iron dose at discharge: per protocol  Heparin change: No  EDW Change: Yes New EDW: 96 kg  Bath Change: 3.0 k bath with weekly K+ levels.   Access intervention/Change: No Details:  Hectorol/Calcitriol change: No monitor calcium closely  Discharge Labs: Calcium 8.1 corrected calcium 9.8  Phosphorus 2.9 Albumin 1.9 K+ 3.6  IV Antibiotics: NA Details:  On Coumadin?: NA Last INR: Next INR: Managed By:   OTHER/APPTS/LAB ORDERS: Tell Dr. Signe Colt Torsemide stopped by primary    D/C Meds to be reconciled by nurse after every discharge.  Completed By: Alonna Buckler [facility] [phone]    Reviewed by: MD:______ RN_______

## 2023-09-21 DIAGNOSIS — N186 End stage renal disease: Secondary | ICD-10-CM | POA: Diagnosis not present

## 2023-09-21 DIAGNOSIS — N2581 Secondary hyperparathyroidism of renal origin: Secondary | ICD-10-CM | POA: Diagnosis not present

## 2023-09-21 DIAGNOSIS — N179 Acute kidney failure, unspecified: Secondary | ICD-10-CM | POA: Diagnosis not present

## 2023-09-21 DIAGNOSIS — Z992 Dependence on renal dialysis: Secondary | ICD-10-CM | POA: Diagnosis not present

## 2023-09-21 NOTE — Progress Notes (Signed)
 Late Note Entry- September 21, 2023  Pt was d/c to snf yesterday. Contacted [facility] this morning to be advised of pt's d/c date and that pt should resume care today. Clinic provided snf name as well.   Olivia Canter Renal Navigator [phone]

## 2023-09-22 DIAGNOSIS — Z992 Dependence on renal dialysis: Secondary | ICD-10-CM | POA: Diagnosis not present

## 2023-09-22 DIAGNOSIS — Z7189 Other specified counseling: Secondary | ICD-10-CM | POA: Diagnosis not present

## 2023-09-22 DIAGNOSIS — R531 Weakness: Secondary | ICD-10-CM | POA: Diagnosis not present

## 2023-09-22 DIAGNOSIS — Z89612 Acquired absence of left leg above knee: Secondary | ICD-10-CM | POA: Diagnosis not present

## 2023-09-22 DIAGNOSIS — Z89611 Acquired absence of right leg above knee: Secondary | ICD-10-CM | POA: Diagnosis not present

## 2023-09-22 DIAGNOSIS — N186 End stage renal disease: Secondary | ICD-10-CM | POA: Diagnosis not present

## 2023-09-23 ENCOUNTER — Encounter: Payer: Medicare HMO | Admitting: Surgical

## 2023-09-23 DIAGNOSIS — E1165 Type 2 diabetes mellitus with hyperglycemia: Secondary | ICD-10-CM | POA: Diagnosis not present

## 2023-09-23 DIAGNOSIS — Z89612 Acquired absence of left leg above knee: Secondary | ICD-10-CM | POA: Diagnosis not present

## 2023-09-23 DIAGNOSIS — R531 Weakness: Secondary | ICD-10-CM | POA: Diagnosis not present

## 2023-09-23 DIAGNOSIS — E119 Type 2 diabetes mellitus without complications: Secondary | ICD-10-CM | POA: Diagnosis not present

## 2023-09-23 DIAGNOSIS — E46 Unspecified protein-calorie malnutrition: Secondary | ICD-10-CM | POA: Diagnosis not present

## 2023-09-23 DIAGNOSIS — L89893 Pressure ulcer of other site, stage 3: Secondary | ICD-10-CM | POA: Diagnosis not present

## 2023-09-23 DIAGNOSIS — I96 Gangrene, not elsewhere classified: Secondary | ICD-10-CM | POA: Diagnosis not present

## 2023-09-23 DIAGNOSIS — L89153 Pressure ulcer of sacral region, stage 3: Secondary | ICD-10-CM | POA: Diagnosis not present

## 2023-09-23 DIAGNOSIS — Z89611 Acquired absence of right leg above knee: Secondary | ICD-10-CM | POA: Diagnosis not present

## 2023-09-23 DIAGNOSIS — Z4781 Encounter for orthopedic aftercare following surgical amputation: Secondary | ICD-10-CM | POA: Diagnosis not present

## 2023-09-23 DIAGNOSIS — E569 Vitamin deficiency, unspecified: Secondary | ICD-10-CM | POA: Diagnosis not present

## 2023-09-23 DIAGNOSIS — N186 End stage renal disease: Secondary | ICD-10-CM | POA: Diagnosis not present

## 2023-09-24 DIAGNOSIS — E119 Type 2 diabetes mellitus without complications: Secondary | ICD-10-CM | POA: Diagnosis not present

## 2023-09-24 DIAGNOSIS — G8929 Other chronic pain: Secondary | ICD-10-CM | POA: Diagnosis not present

## 2023-09-24 DIAGNOSIS — R531 Weakness: Secondary | ICD-10-CM | POA: Diagnosis not present

## 2023-09-24 LAB — CULTURE, BLOOD (ROUTINE X 2)
Culture: NO GROWTH
Culture: NO GROWTH
Special Requests: ADEQUATE

## 2023-09-25 DIAGNOSIS — N2581 Secondary hyperparathyroidism of renal origin: Secondary | ICD-10-CM | POA: Diagnosis not present

## 2023-09-25 DIAGNOSIS — N186 End stage renal disease: Secondary | ICD-10-CM | POA: Diagnosis not present

## 2023-09-25 DIAGNOSIS — Z992 Dependence on renal dialysis: Secondary | ICD-10-CM | POA: Diagnosis not present

## 2023-09-28 DIAGNOSIS — N186 End stage renal disease: Secondary | ICD-10-CM | POA: Diagnosis not present

## 2023-09-28 DIAGNOSIS — Z992 Dependence on renal dialysis: Secondary | ICD-10-CM | POA: Diagnosis not present

## 2023-09-28 DIAGNOSIS — N2581 Secondary hyperparathyroidism of renal origin: Secondary | ICD-10-CM | POA: Diagnosis not present

## 2023-09-29 DIAGNOSIS — R52 Pain, unspecified: Secondary | ICD-10-CM | POA: Diagnosis not present

## 2023-09-29 DIAGNOSIS — R531 Weakness: Secondary | ICD-10-CM | POA: Diagnosis not present

## 2023-09-30 DIAGNOSIS — J449 Chronic obstructive pulmonary disease, unspecified: Secondary | ICD-10-CM | POA: Diagnosis not present

## 2023-09-30 DIAGNOSIS — R531 Weakness: Secondary | ICD-10-CM | POA: Diagnosis not present

## 2023-09-30 DIAGNOSIS — Z7409 Other reduced mobility: Secondary | ICD-10-CM | POA: Diagnosis not present

## 2023-09-30 DIAGNOSIS — Z89611 Acquired absence of right leg above knee: Secondary | ICD-10-CM | POA: Diagnosis not present

## 2023-09-30 DIAGNOSIS — G8929 Other chronic pain: Secondary | ICD-10-CM | POA: Diagnosis not present

## 2023-09-30 DIAGNOSIS — Z9989 Dependence on other enabling machines and devices: Secondary | ICD-10-CM | POA: Diagnosis not present

## 2023-09-30 DIAGNOSIS — Z89612 Acquired absence of left leg above knee: Secondary | ICD-10-CM | POA: Diagnosis not present

## 2023-10-02 DIAGNOSIS — N186 End stage renal disease: Secondary | ICD-10-CM | POA: Diagnosis not present

## 2023-10-02 DIAGNOSIS — Z992 Dependence on renal dialysis: Secondary | ICD-10-CM | POA: Diagnosis not present

## 2023-10-02 DIAGNOSIS — N2581 Secondary hyperparathyroidism of renal origin: Secondary | ICD-10-CM | POA: Diagnosis not present

## 2023-10-05 DIAGNOSIS — N186 End stage renal disease: Secondary | ICD-10-CM | POA: Diagnosis not present

## 2023-10-05 DIAGNOSIS — Z992 Dependence on renal dialysis: Secondary | ICD-10-CM | POA: Diagnosis not present

## 2023-10-05 DIAGNOSIS — N2581 Secondary hyperparathyroidism of renal origin: Secondary | ICD-10-CM | POA: Diagnosis not present

## 2023-10-07 DIAGNOSIS — Z89612 Acquired absence of left leg above knee: Secondary | ICD-10-CM | POA: Diagnosis not present

## 2023-10-07 DIAGNOSIS — Z992 Dependence on renal dialysis: Secondary | ICD-10-CM | POA: Diagnosis not present

## 2023-10-07 DIAGNOSIS — E1140 Type 2 diabetes mellitus with diabetic neuropathy, unspecified: Secondary | ICD-10-CM | POA: Diagnosis not present

## 2023-10-07 DIAGNOSIS — L89159 Pressure ulcer of sacral region, unspecified stage: Secondary | ICD-10-CM | POA: Diagnosis not present

## 2023-10-07 DIAGNOSIS — Z89611 Acquired absence of right leg above knee: Secondary | ICD-10-CM | POA: Diagnosis not present

## 2023-10-07 DIAGNOSIS — L89329 Pressure ulcer of left buttock, unspecified stage: Secondary | ICD-10-CM | POA: Diagnosis not present

## 2023-10-07 DIAGNOSIS — R32 Unspecified urinary incontinence: Secondary | ICD-10-CM | POA: Diagnosis not present

## 2023-10-07 DIAGNOSIS — N186 End stage renal disease: Secondary | ICD-10-CM | POA: Diagnosis not present

## 2023-10-09 DIAGNOSIS — Z992 Dependence on renal dialysis: Secondary | ICD-10-CM | POA: Diagnosis not present

## 2023-10-09 DIAGNOSIS — N186 End stage renal disease: Secondary | ICD-10-CM | POA: Diagnosis not present

## 2023-10-09 DIAGNOSIS — N2581 Secondary hyperparathyroidism of renal origin: Secondary | ICD-10-CM | POA: Diagnosis not present

## 2023-10-10 DIAGNOSIS — T8744 Infection of amputation stump, left lower extremity: Secondary | ICD-10-CM | POA: Diagnosis not present

## 2023-10-10 DIAGNOSIS — L89313 Pressure ulcer of right buttock, stage 3: Secondary | ICD-10-CM | POA: Diagnosis not present

## 2023-10-10 DIAGNOSIS — T8753 Necrosis of amputation stump, right lower extremity: Secondary | ICD-10-CM | POA: Diagnosis not present

## 2023-10-10 DIAGNOSIS — I132 Hypertensive heart and chronic kidney disease with heart failure and with stage 5 chronic kidney disease, or end stage renal disease: Secondary | ICD-10-CM | POA: Diagnosis not present

## 2023-10-10 DIAGNOSIS — L89323 Pressure ulcer of left buttock, stage 3: Secondary | ICD-10-CM | POA: Diagnosis not present

## 2023-10-10 DIAGNOSIS — I5042 Chronic combined systolic (congestive) and diastolic (congestive) heart failure: Secondary | ICD-10-CM | POA: Diagnosis not present

## 2023-10-10 DIAGNOSIS — Z89611 Acquired absence of right leg above knee: Secondary | ICD-10-CM | POA: Diagnosis not present

## 2023-10-10 DIAGNOSIS — Z89612 Acquired absence of left leg above knee: Secondary | ICD-10-CM | POA: Diagnosis not present

## 2023-10-10 DIAGNOSIS — L89153 Pressure ulcer of sacral region, stage 3: Secondary | ICD-10-CM | POA: Diagnosis not present

## 2023-10-12 ENCOUNTER — Telehealth: Payer: Self-pay

## 2023-10-12 ENCOUNTER — Telehealth: Payer: Self-pay | Admitting: Cardiology

## 2023-10-12 DIAGNOSIS — Z992 Dependence on renal dialysis: Secondary | ICD-10-CM | POA: Diagnosis not present

## 2023-10-12 DIAGNOSIS — N2581 Secondary hyperparathyroidism of renal origin: Secondary | ICD-10-CM | POA: Diagnosis not present

## 2023-10-12 DIAGNOSIS — N186 End stage renal disease: Secondary | ICD-10-CM | POA: Diagnosis not present

## 2023-10-12 NOTE — Telephone Encounter (Signed)
 Recommendations reviewed with [name] [name] per DPR pt as per Dr. Madireddy's note. [name] [name] verbalized understanding and had no additional questions.

## 2023-10-12 NOTE — Telephone Encounter (Signed)
 Home Health: -Lynnie Saucier, RN @ [facility] requesting [facility] RN orders.  S/p bilateral AKA's & coccyx wounds -verbal order given for RN: 1 time per week for 9 weeks.

## 2023-10-12 NOTE — Telephone Encounter (Signed)
 Pt c/o medication issue:  1. Name of Medication: Torsemide   2. How are you currently taking this medication (dosage and times per day)?   3. Are you having a reaction (difficulty breathing--STAT)?   4. What is your medication issue?  Patient was  taken off this medicine, by his primary doctor while he was in the hospital- wife wants to know if patient should start back taking the Torsemide ?

## 2023-10-13 DIAGNOSIS — L89153 Pressure ulcer of sacral region, stage 3: Secondary | ICD-10-CM | POA: Diagnosis not present

## 2023-10-13 DIAGNOSIS — L89323 Pressure ulcer of left buttock, stage 3: Secondary | ICD-10-CM | POA: Diagnosis not present

## 2023-10-13 DIAGNOSIS — T8753 Necrosis of amputation stump, right lower extremity: Secondary | ICD-10-CM | POA: Diagnosis not present

## 2023-10-13 DIAGNOSIS — I132 Hypertensive heart and chronic kidney disease with heart failure and with stage 5 chronic kidney disease, or end stage renal disease: Secondary | ICD-10-CM | POA: Diagnosis not present

## 2023-10-13 DIAGNOSIS — Z89612 Acquired absence of left leg above knee: Secondary | ICD-10-CM | POA: Diagnosis not present

## 2023-10-13 DIAGNOSIS — Z89611 Acquired absence of right leg above knee: Secondary | ICD-10-CM | POA: Diagnosis not present

## 2023-10-13 DIAGNOSIS — T8744 Infection of amputation stump, left lower extremity: Secondary | ICD-10-CM | POA: Diagnosis not present

## 2023-10-13 DIAGNOSIS — I5042 Chronic combined systolic (congestive) and diastolic (congestive) heart failure: Secondary | ICD-10-CM | POA: Diagnosis not present

## 2023-10-13 DIAGNOSIS — L89313 Pressure ulcer of right buttock, stage 3: Secondary | ICD-10-CM | POA: Diagnosis not present

## 2023-10-14 ENCOUNTER — Ambulatory Visit (INDEPENDENT_AMBULATORY_CARE_PROVIDER_SITE_OTHER): Admitting: Physician Assistant

## 2023-10-14 VITALS — BP 146/72 | HR 68 | Temp 98.3°F | Wt 213.4 lb

## 2023-10-14 DIAGNOSIS — Z89612 Acquired absence of left leg above knee: Secondary | ICD-10-CM

## 2023-10-14 DIAGNOSIS — I739 Peripheral vascular disease, unspecified: Secondary | ICD-10-CM

## 2023-10-14 DIAGNOSIS — Z89611 Acquired absence of right leg above knee: Secondary | ICD-10-CM

## 2023-10-15 ENCOUNTER — Telehealth: Payer: Self-pay

## 2023-10-15 DIAGNOSIS — Z89611 Acquired absence of right leg above knee: Secondary | ICD-10-CM | POA: Diagnosis not present

## 2023-10-15 DIAGNOSIS — L89323 Pressure ulcer of left buttock, stage 3: Secondary | ICD-10-CM | POA: Diagnosis not present

## 2023-10-15 DIAGNOSIS — L89153 Pressure ulcer of sacral region, stage 3: Secondary | ICD-10-CM | POA: Diagnosis not present

## 2023-10-15 DIAGNOSIS — T8744 Infection of amputation stump, left lower extremity: Secondary | ICD-10-CM | POA: Diagnosis not present

## 2023-10-15 DIAGNOSIS — I132 Hypertensive heart and chronic kidney disease with heart failure and with stage 5 chronic kidney disease, or end stage renal disease: Secondary | ICD-10-CM | POA: Diagnosis not present

## 2023-10-15 DIAGNOSIS — Z89612 Acquired absence of left leg above knee: Secondary | ICD-10-CM | POA: Diagnosis not present

## 2023-10-15 DIAGNOSIS — I5042 Chronic combined systolic (congestive) and diastolic (congestive) heart failure: Secondary | ICD-10-CM | POA: Diagnosis not present

## 2023-10-15 DIAGNOSIS — T8753 Necrosis of amputation stump, right lower extremity: Secondary | ICD-10-CM | POA: Diagnosis not present

## 2023-10-15 DIAGNOSIS — L89313 Pressure ulcer of right buttock, stage 3: Secondary | ICD-10-CM | POA: Diagnosis not present

## 2023-10-15 NOTE — Telephone Encounter (Signed)
-  PT @ [facility] called to notify us  pt had PT evaluation and PT will not be continued d/t pt's abilities of self care

## 2023-10-15 NOTE — Progress Notes (Signed)
 POST OPERATIVE OFFICE NOTE    CC:  F/u for surgery  HPI:  [name] [name] is a 68 y.o. male who is here for incision check.  He recently underwent bilateral above-knee amputation on 09/08/2023 by Dr. Vikki Graves.  This was done for critical limb ischemia with tissue loss of a previous left BKA and right foot gas gangrene.  He returns today for follow-up.  He says that he has been feeling much better and stronger every day since his surgery.  He has some phantom pain and residual limb pain, however this is greatly improved.  He has been keeping his incisions clean and dry.  He denies any drainage from his incisions, redness, or tenderness.  He also denies any fevers or chills.   Allergies  Allergen Reactions   Tape Rash    Plastic tape -needs paper tape     Current Outpatient Medications  Medication Sig Dispense Refill   Accu-Chek Softclix Lancets lancets 1 each by Other route daily.     albuterol  (VENTOLIN  HFA) 108 (90 Base) MCG/ACT inhaler Inhale 2 puffs into the lungs every 6 (six) hours as needed for wheezing or shortness of breath.     amiodarone  (PACERONE ) 200 MG tablet TAKE 1 TABLET TWICE DAILY 180 tablet 3   apixaban  (ELIQUIS ) 5 MG TABS tablet Take 1 tablet (5 mg total) by mouth 2 (two) times daily.     atorvastatin  (LIPITOR ) 80 MG tablet Take 1 tablet (80 mg total) by mouth daily. 90 tablet 2   B Complex-C-Folic Acid  (DIALYVITE 800) 0.8 MG TABS Take 0.8 mg by mouth daily.     Blood Glucose Monitoring Suppl (ACCU-CHEK GUIDE ME) w/Device KIT 1 Device by Other route 3 (three) times daily.     fluticasone  furoate-vilanterol (BREO ELLIPTA ) 200-25 MCG/ACT AEPB Inhale 2 puffs into the lungs daily.  3   glucose blood test strip 1 each by Other route 3 (three) times daily.     HYDROcodone -acetaminophen  (NORCO/VICODIN) 5-325 MG tablet Take 1-2 tablets by mouth every 6 (six) hours as needed for moderate pain (pain score 4-6) or severe pain (pain score 7-10). 15 tablet 0   insulin  aspart (NOVOLOG )  100 UNIT/ML injection Inject 0-9 Units into the skin 3 (three) times daily with meals. Sliding scale CBG 70 - 120: 0 units CBG 121 - 150: 1 unit,  CBG 151 - 200: 2 units,  CBG 201 - 250: 3 units,  CBG 251 - 300: 5 units,  CBG 301 - 350: 7 units,  CBG 351 - 400: 9 units   CBG > 400: 9 units and notify your MD     insulin  glargine (LANTUS ) 100 UNIT/ML injection Inject 0.18 mLs (18 Units total) into the skin at bedtime.     Insulin  Pen Needle 32G X 4 MM MISC Use to inject Levemir  2 (two) times daily. 100 each 1   midodrine  (PROAMATINE ) 5 MG tablet TAKE 1 TABLET EVERY DAY 90 tablet 2   montelukast  (SINGULAIR ) 10 MG tablet Take 10 mg by mouth daily.     No current facility-administered medications for this visit.     ROS:  See HPI  Physical Exam:  Incision: Bilateral above-knee amputation incisions well-healed without signs of infection, all staples removed Neuro: Intact motor and sensation of bilateral AKA stumps    Assessment/Plan:  This is a 67 y.o. male who is here for postop check  - The patient recently underwent bilateral above-knee amputation for critical limb ischemia with tissue loss and no further  revascularization options -He has been keeping his incisions clean and dry.  He denies any drainage, redness, or tenderness -His above-knee amputations bilaterally are well-healed without signs of infection.  I have removed all of his staples today - He continues to get stronger after his surgery.  He is hopeful to regain his mobility and walk again in the future.  He would like to be fitted for prosthetics bilaterally. - I will send in a referral to [facility] for stump shrinkers and to begin the process of prosthetic fitting.  - He can follow-up with our office as needed   The patient has a bilateral Above Knee Amputation. The patient is well motivated to return to their prior functional status by utilizing a prosthesis to perform ADL's and maintain a healthy lifestyle. The patient has the  physical and cognitive capacity to function with a prosthesis.   Functional Level: K1 Household Ambulator: Has the ability or potential to use prosthesis for transfers/ambulation on level surfaces at a fixed cadence  Residual Limb History: The skin condition of the residual limb is intact. The patient will continue to monitor the skin of the residual limb and follow hygiene instructions.  The patient is experiencing phantom limb pain  and residual limb pain  Prosthetic Prescription Plan: Counseling and education regarding prosthetic management will be provided to the patient via a certified prosthetist. A multi-discipline team, including physical therapy, will manage the prosthetic fabrication, fitting and prosthetic gait training.    Five Tenants for [employer] Prosthesis: 1) The patient has expressed a desire to ambulate with a prosthesis 2) Current functional level: Able to transfer in and out of wheelchair independently or with sliding board 3) Expected functional level: K1 household ambulator 4) The patient's physician is in agreement with prosthetic plan of care 5) The patient has no comorbid conditions that will affect his ambulation or ability to use prosthesis  Deneise Finlay, PA-C [facility] [phone]   Clinic MD:  Vikki Graves

## 2023-10-16 DIAGNOSIS — Z992 Dependence on renal dialysis: Secondary | ICD-10-CM | POA: Diagnosis not present

## 2023-10-16 DIAGNOSIS — N186 End stage renal disease: Secondary | ICD-10-CM | POA: Diagnosis not present

## 2023-10-16 DIAGNOSIS — N2581 Secondary hyperparathyroidism of renal origin: Secondary | ICD-10-CM | POA: Diagnosis not present

## 2023-10-19 DIAGNOSIS — N186 End stage renal disease: Secondary | ICD-10-CM | POA: Diagnosis not present

## 2023-10-19 DIAGNOSIS — Z992 Dependence on renal dialysis: Secondary | ICD-10-CM | POA: Diagnosis not present

## 2023-10-19 DIAGNOSIS — N2581 Secondary hyperparathyroidism of renal origin: Secondary | ICD-10-CM | POA: Diagnosis not present

## 2023-10-21 DIAGNOSIS — N179 Acute kidney failure, unspecified: Secondary | ICD-10-CM | POA: Diagnosis not present

## 2023-10-21 DIAGNOSIS — Z992 Dependence on renal dialysis: Secondary | ICD-10-CM | POA: Diagnosis not present

## 2023-10-21 DIAGNOSIS — N186 End stage renal disease: Secondary | ICD-10-CM | POA: Diagnosis not present

## 2023-10-22 ENCOUNTER — Other Ambulatory Visit: Payer: Self-pay | Admitting: Cardiology

## 2023-10-22 DIAGNOSIS — L89323 Pressure ulcer of left buttock, stage 3: Secondary | ICD-10-CM | POA: Diagnosis not present

## 2023-10-22 DIAGNOSIS — T8753 Necrosis of amputation stump, right lower extremity: Secondary | ICD-10-CM | POA: Diagnosis not present

## 2023-10-22 DIAGNOSIS — L89153 Pressure ulcer of sacral region, stage 3: Secondary | ICD-10-CM | POA: Diagnosis not present

## 2023-10-22 DIAGNOSIS — Z89612 Acquired absence of left leg above knee: Secondary | ICD-10-CM | POA: Diagnosis not present

## 2023-10-22 DIAGNOSIS — I5042 Chronic combined systolic (congestive) and diastolic (congestive) heart failure: Secondary | ICD-10-CM | POA: Diagnosis not present

## 2023-10-22 DIAGNOSIS — I480 Paroxysmal atrial fibrillation: Secondary | ICD-10-CM

## 2023-10-22 DIAGNOSIS — Z89611 Acquired absence of right leg above knee: Secondary | ICD-10-CM | POA: Diagnosis not present

## 2023-10-22 DIAGNOSIS — L89313 Pressure ulcer of right buttock, stage 3: Secondary | ICD-10-CM | POA: Diagnosis not present

## 2023-10-22 DIAGNOSIS — T8744 Infection of amputation stump, left lower extremity: Secondary | ICD-10-CM | POA: Diagnosis not present

## 2023-10-22 DIAGNOSIS — I132 Hypertensive heart and chronic kidney disease with heart failure and with stage 5 chronic kidney disease, or end stage renal disease: Secondary | ICD-10-CM | POA: Diagnosis not present

## 2023-10-22 NOTE — Telephone Encounter (Signed)
 Prescription refill request for Eliquis  received. Indication: Aflutter Last office visit: 06/23/23 Sandee Crook)  Scr:3.80 (3/30/258)  Age: 68 Weight: 96.8kg  Appropriate dose. Refill sent.

## 2023-10-23 DIAGNOSIS — N2581 Secondary hyperparathyroidism of renal origin: Secondary | ICD-10-CM | POA: Diagnosis not present

## 2023-10-23 DIAGNOSIS — Z992 Dependence on renal dialysis: Secondary | ICD-10-CM | POA: Diagnosis not present

## 2023-10-23 DIAGNOSIS — N186 End stage renal disease: Secondary | ICD-10-CM | POA: Diagnosis not present

## 2023-10-26 DIAGNOSIS — N186 End stage renal disease: Secondary | ICD-10-CM | POA: Diagnosis not present

## 2023-10-26 DIAGNOSIS — Z992 Dependence on renal dialysis: Secondary | ICD-10-CM | POA: Diagnosis not present

## 2023-10-26 DIAGNOSIS — N2581 Secondary hyperparathyroidism of renal origin: Secondary | ICD-10-CM | POA: Diagnosis not present

## 2023-10-27 DIAGNOSIS — T8744 Infection of amputation stump, left lower extremity: Secondary | ICD-10-CM | POA: Diagnosis not present

## 2023-10-27 DIAGNOSIS — L89313 Pressure ulcer of right buttock, stage 3: Secondary | ICD-10-CM | POA: Diagnosis not present

## 2023-10-27 DIAGNOSIS — Z89612 Acquired absence of left leg above knee: Secondary | ICD-10-CM | POA: Diagnosis not present

## 2023-10-27 DIAGNOSIS — L89153 Pressure ulcer of sacral region, stage 3: Secondary | ICD-10-CM | POA: Diagnosis not present

## 2023-10-27 DIAGNOSIS — T8753 Necrosis of amputation stump, right lower extremity: Secondary | ICD-10-CM | POA: Diagnosis not present

## 2023-10-27 DIAGNOSIS — I5042 Chronic combined systolic (congestive) and diastolic (congestive) heart failure: Secondary | ICD-10-CM | POA: Diagnosis not present

## 2023-10-27 DIAGNOSIS — Z89611 Acquired absence of right leg above knee: Secondary | ICD-10-CM | POA: Diagnosis not present

## 2023-10-27 DIAGNOSIS — I132 Hypertensive heart and chronic kidney disease with heart failure and with stage 5 chronic kidney disease, or end stage renal disease: Secondary | ICD-10-CM | POA: Diagnosis not present

## 2023-10-27 DIAGNOSIS — L89323 Pressure ulcer of left buttock, stage 3: Secondary | ICD-10-CM | POA: Diagnosis not present

## 2023-10-30 DIAGNOSIS — Z992 Dependence on renal dialysis: Secondary | ICD-10-CM | POA: Diagnosis not present

## 2023-10-30 DIAGNOSIS — N2581 Secondary hyperparathyroidism of renal origin: Secondary | ICD-10-CM | POA: Diagnosis not present

## 2023-10-30 DIAGNOSIS — N186 End stage renal disease: Secondary | ICD-10-CM | POA: Diagnosis not present

## 2023-11-02 DIAGNOSIS — N186 End stage renal disease: Secondary | ICD-10-CM | POA: Diagnosis not present

## 2023-11-02 DIAGNOSIS — Z992 Dependence on renal dialysis: Secondary | ICD-10-CM | POA: Diagnosis not present

## 2023-11-02 DIAGNOSIS — N2581 Secondary hyperparathyroidism of renal origin: Secondary | ICD-10-CM | POA: Diagnosis not present

## 2023-11-05 DIAGNOSIS — L89153 Pressure ulcer of sacral region, stage 3: Secondary | ICD-10-CM | POA: Diagnosis not present

## 2023-11-05 DIAGNOSIS — Z89612 Acquired absence of left leg above knee: Secondary | ICD-10-CM | POA: Diagnosis not present

## 2023-11-05 DIAGNOSIS — Z89611 Acquired absence of right leg above knee: Secondary | ICD-10-CM | POA: Diagnosis not present

## 2023-11-05 DIAGNOSIS — I132 Hypertensive heart and chronic kidney disease with heart failure and with stage 5 chronic kidney disease, or end stage renal disease: Secondary | ICD-10-CM | POA: Diagnosis not present

## 2023-11-05 DIAGNOSIS — I5042 Chronic combined systolic (congestive) and diastolic (congestive) heart failure: Secondary | ICD-10-CM | POA: Diagnosis not present

## 2023-11-05 DIAGNOSIS — L89313 Pressure ulcer of right buttock, stage 3: Secondary | ICD-10-CM | POA: Diagnosis not present

## 2023-11-05 DIAGNOSIS — T8753 Necrosis of amputation stump, right lower extremity: Secondary | ICD-10-CM | POA: Diagnosis not present

## 2023-11-05 DIAGNOSIS — L89323 Pressure ulcer of left buttock, stage 3: Secondary | ICD-10-CM | POA: Diagnosis not present

## 2023-11-05 DIAGNOSIS — T8744 Infection of amputation stump, left lower extremity: Secondary | ICD-10-CM | POA: Diagnosis not present

## 2023-11-06 DIAGNOSIS — N186 End stage renal disease: Secondary | ICD-10-CM | POA: Diagnosis not present

## 2023-11-06 DIAGNOSIS — N2581 Secondary hyperparathyroidism of renal origin: Secondary | ICD-10-CM | POA: Diagnosis not present

## 2023-11-06 DIAGNOSIS — Z992 Dependence on renal dialysis: Secondary | ICD-10-CM | POA: Diagnosis not present

## 2023-11-09 DIAGNOSIS — Z992 Dependence on renal dialysis: Secondary | ICD-10-CM | POA: Diagnosis not present

## 2023-11-09 DIAGNOSIS — N2581 Secondary hyperparathyroidism of renal origin: Secondary | ICD-10-CM | POA: Diagnosis not present

## 2023-11-09 DIAGNOSIS — N186 End stage renal disease: Secondary | ICD-10-CM | POA: Diagnosis not present

## 2023-11-10 DIAGNOSIS — Z89612 Acquired absence of left leg above knee: Secondary | ICD-10-CM | POA: Diagnosis not present

## 2023-11-10 DIAGNOSIS — L89313 Pressure ulcer of right buttock, stage 3: Secondary | ICD-10-CM | POA: Diagnosis not present

## 2023-11-10 DIAGNOSIS — I132 Hypertensive heart and chronic kidney disease with heart failure and with stage 5 chronic kidney disease, or end stage renal disease: Secondary | ICD-10-CM | POA: Diagnosis not present

## 2023-11-10 DIAGNOSIS — I5042 Chronic combined systolic (congestive) and diastolic (congestive) heart failure: Secondary | ICD-10-CM | POA: Diagnosis not present

## 2023-11-10 DIAGNOSIS — T8744 Infection of amputation stump, left lower extremity: Secondary | ICD-10-CM | POA: Diagnosis not present

## 2023-11-10 DIAGNOSIS — L89323 Pressure ulcer of left buttock, stage 3: Secondary | ICD-10-CM | POA: Diagnosis not present

## 2023-11-10 DIAGNOSIS — T8753 Necrosis of amputation stump, right lower extremity: Secondary | ICD-10-CM | POA: Diagnosis not present

## 2023-11-10 DIAGNOSIS — L89153 Pressure ulcer of sacral region, stage 3: Secondary | ICD-10-CM | POA: Diagnosis not present

## 2023-11-10 DIAGNOSIS — Z89611 Acquired absence of right leg above knee: Secondary | ICD-10-CM | POA: Diagnosis not present

## 2023-11-13 DIAGNOSIS — N186 End stage renal disease: Secondary | ICD-10-CM | POA: Diagnosis not present

## 2023-11-13 DIAGNOSIS — N2581 Secondary hyperparathyroidism of renal origin: Secondary | ICD-10-CM | POA: Diagnosis not present

## 2023-11-13 DIAGNOSIS — Z992 Dependence on renal dialysis: Secondary | ICD-10-CM | POA: Diagnosis not present

## 2023-11-16 DIAGNOSIS — Z992 Dependence on renal dialysis: Secondary | ICD-10-CM | POA: Diagnosis not present

## 2023-11-16 DIAGNOSIS — N2581 Secondary hyperparathyroidism of renal origin: Secondary | ICD-10-CM | POA: Diagnosis not present

## 2023-11-16 DIAGNOSIS — N186 End stage renal disease: Secondary | ICD-10-CM | POA: Diagnosis not present

## 2023-11-19 DIAGNOSIS — T8744 Infection of amputation stump, left lower extremity: Secondary | ICD-10-CM | POA: Diagnosis not present

## 2023-11-19 DIAGNOSIS — Z89612 Acquired absence of left leg above knee: Secondary | ICD-10-CM | POA: Diagnosis not present

## 2023-11-19 DIAGNOSIS — L89153 Pressure ulcer of sacral region, stage 3: Secondary | ICD-10-CM | POA: Diagnosis not present

## 2023-11-19 DIAGNOSIS — T8753 Necrosis of amputation stump, right lower extremity: Secondary | ICD-10-CM | POA: Diagnosis not present

## 2023-11-19 DIAGNOSIS — I5042 Chronic combined systolic (congestive) and diastolic (congestive) heart failure: Secondary | ICD-10-CM | POA: Diagnosis not present

## 2023-11-19 DIAGNOSIS — Z89611 Acquired absence of right leg above knee: Secondary | ICD-10-CM | POA: Diagnosis not present

## 2023-11-19 DIAGNOSIS — L89313 Pressure ulcer of right buttock, stage 3: Secondary | ICD-10-CM | POA: Diagnosis not present

## 2023-11-19 DIAGNOSIS — L89323 Pressure ulcer of left buttock, stage 3: Secondary | ICD-10-CM | POA: Diagnosis not present

## 2023-11-19 DIAGNOSIS — I132 Hypertensive heart and chronic kidney disease with heart failure and with stage 5 chronic kidney disease, or end stage renal disease: Secondary | ICD-10-CM | POA: Diagnosis not present

## 2023-11-20 DIAGNOSIS — Z992 Dependence on renal dialysis: Secondary | ICD-10-CM | POA: Diagnosis not present

## 2023-11-20 DIAGNOSIS — N2581 Secondary hyperparathyroidism of renal origin: Secondary | ICD-10-CM | POA: Diagnosis not present

## 2023-11-20 DIAGNOSIS — N186 End stage renal disease: Secondary | ICD-10-CM | POA: Diagnosis not present

## 2023-11-21 DIAGNOSIS — Z992 Dependence on renal dialysis: Secondary | ICD-10-CM | POA: Diagnosis not present

## 2023-11-21 DIAGNOSIS — N186 End stage renal disease: Secondary | ICD-10-CM | POA: Diagnosis not present

## 2023-11-21 DIAGNOSIS — N179 Acute kidney failure, unspecified: Secondary | ICD-10-CM | POA: Diagnosis not present

## 2023-11-23 DIAGNOSIS — N186 End stage renal disease: Secondary | ICD-10-CM | POA: Diagnosis not present

## 2023-11-23 DIAGNOSIS — Z992 Dependence on renal dialysis: Secondary | ICD-10-CM | POA: Diagnosis not present

## 2023-11-23 DIAGNOSIS — N2581 Secondary hyperparathyroidism of renal origin: Secondary | ICD-10-CM | POA: Diagnosis not present

## 2023-11-24 ENCOUNTER — Encounter: Payer: Self-pay | Admitting: Cardiology

## 2023-11-24 DIAGNOSIS — G4733 Obstructive sleep apnea (adult) (pediatric): Secondary | ICD-10-CM | POA: Diagnosis not present

## 2023-11-24 DIAGNOSIS — R0600 Dyspnea, unspecified: Secondary | ICD-10-CM | POA: Diagnosis not present

## 2023-11-24 DIAGNOSIS — Z7984 Long term (current) use of oral hypoglycemic drugs: Secondary | ICD-10-CM | POA: Diagnosis not present

## 2023-11-24 DIAGNOSIS — J301 Allergic rhinitis due to pollen: Secondary | ICD-10-CM | POA: Diagnosis not present

## 2023-11-24 DIAGNOSIS — E119 Type 2 diabetes mellitus without complications: Secondary | ICD-10-CM | POA: Diagnosis not present

## 2023-11-24 DIAGNOSIS — H40003 Preglaucoma, unspecified, bilateral: Secondary | ICD-10-CM | POA: Diagnosis not present

## 2023-11-24 DIAGNOSIS — J449 Chronic obstructive pulmonary disease, unspecified: Secondary | ICD-10-CM | POA: Diagnosis not present

## 2023-11-25 DIAGNOSIS — Z89611 Acquired absence of right leg above knee: Secondary | ICD-10-CM | POA: Diagnosis not present

## 2023-11-25 DIAGNOSIS — I5042 Chronic combined systolic (congestive) and diastolic (congestive) heart failure: Secondary | ICD-10-CM | POA: Diagnosis not present

## 2023-11-25 DIAGNOSIS — L89153 Pressure ulcer of sacral region, stage 3: Secondary | ICD-10-CM | POA: Diagnosis not present

## 2023-11-25 DIAGNOSIS — I132 Hypertensive heart and chronic kidney disease with heart failure and with stage 5 chronic kidney disease, or end stage renal disease: Secondary | ICD-10-CM | POA: Diagnosis not present

## 2023-11-25 DIAGNOSIS — L89313 Pressure ulcer of right buttock, stage 3: Secondary | ICD-10-CM | POA: Diagnosis not present

## 2023-11-25 DIAGNOSIS — T8744 Infection of amputation stump, left lower extremity: Secondary | ICD-10-CM | POA: Diagnosis not present

## 2023-11-25 DIAGNOSIS — L89323 Pressure ulcer of left buttock, stage 3: Secondary | ICD-10-CM | POA: Diagnosis not present

## 2023-11-25 DIAGNOSIS — Z89612 Acquired absence of left leg above knee: Secondary | ICD-10-CM | POA: Diagnosis not present

## 2023-11-25 DIAGNOSIS — T8753 Necrosis of amputation stump, right lower extremity: Secondary | ICD-10-CM | POA: Diagnosis not present

## 2023-11-27 DIAGNOSIS — N186 End stage renal disease: Secondary | ICD-10-CM | POA: Diagnosis not present

## 2023-11-27 DIAGNOSIS — Z992 Dependence on renal dialysis: Secondary | ICD-10-CM | POA: Diagnosis not present

## 2023-11-27 DIAGNOSIS — N2581 Secondary hyperparathyroidism of renal origin: Secondary | ICD-10-CM | POA: Diagnosis not present

## 2023-11-30 DIAGNOSIS — N2581 Secondary hyperparathyroidism of renal origin: Secondary | ICD-10-CM | POA: Diagnosis not present

## 2023-11-30 DIAGNOSIS — Z992 Dependence on renal dialysis: Secondary | ICD-10-CM | POA: Diagnosis not present

## 2023-11-30 DIAGNOSIS — N186 End stage renal disease: Secondary | ICD-10-CM | POA: Diagnosis not present

## 2023-12-02 DIAGNOSIS — T8744 Infection of amputation stump, left lower extremity: Secondary | ICD-10-CM | POA: Diagnosis not present

## 2023-12-02 DIAGNOSIS — Z89611 Acquired absence of right leg above knee: Secondary | ICD-10-CM | POA: Diagnosis not present

## 2023-12-02 DIAGNOSIS — I132 Hypertensive heart and chronic kidney disease with heart failure and with stage 5 chronic kidney disease, or end stage renal disease: Secondary | ICD-10-CM | POA: Diagnosis not present

## 2023-12-02 DIAGNOSIS — L89153 Pressure ulcer of sacral region, stage 3: Secondary | ICD-10-CM | POA: Diagnosis not present

## 2023-12-02 DIAGNOSIS — L89323 Pressure ulcer of left buttock, stage 3: Secondary | ICD-10-CM | POA: Diagnosis not present

## 2023-12-02 DIAGNOSIS — T8753 Necrosis of amputation stump, right lower extremity: Secondary | ICD-10-CM | POA: Diagnosis not present

## 2023-12-02 DIAGNOSIS — Z89612 Acquired absence of left leg above knee: Secondary | ICD-10-CM | POA: Diagnosis not present

## 2023-12-02 DIAGNOSIS — L89313 Pressure ulcer of right buttock, stage 3: Secondary | ICD-10-CM | POA: Diagnosis not present

## 2023-12-02 DIAGNOSIS — I5042 Chronic combined systolic (congestive) and diastolic (congestive) heart failure: Secondary | ICD-10-CM | POA: Diagnosis not present

## 2023-12-04 DIAGNOSIS — Z992 Dependence on renal dialysis: Secondary | ICD-10-CM | POA: Diagnosis not present

## 2023-12-04 DIAGNOSIS — N186 End stage renal disease: Secondary | ICD-10-CM | POA: Diagnosis not present

## 2023-12-04 DIAGNOSIS — N2581 Secondary hyperparathyroidism of renal origin: Secondary | ICD-10-CM | POA: Diagnosis not present

## 2023-12-07 DIAGNOSIS — N2581 Secondary hyperparathyroidism of renal origin: Secondary | ICD-10-CM | POA: Diagnosis not present

## 2023-12-07 DIAGNOSIS — Z992 Dependence on renal dialysis: Secondary | ICD-10-CM | POA: Diagnosis not present

## 2023-12-07 DIAGNOSIS — N186 End stage renal disease: Secondary | ICD-10-CM | POA: Diagnosis not present

## 2023-12-08 DIAGNOSIS — I132 Hypertensive heart and chronic kidney disease with heart failure and with stage 5 chronic kidney disease, or end stage renal disease: Secondary | ICD-10-CM | POA: Diagnosis not present

## 2023-12-08 DIAGNOSIS — Z89612 Acquired absence of left leg above knee: Secondary | ICD-10-CM | POA: Diagnosis not present

## 2023-12-08 DIAGNOSIS — L89153 Pressure ulcer of sacral region, stage 3: Secondary | ICD-10-CM | POA: Diagnosis not present

## 2023-12-08 DIAGNOSIS — I5042 Chronic combined systolic (congestive) and diastolic (congestive) heart failure: Secondary | ICD-10-CM | POA: Diagnosis not present

## 2023-12-08 DIAGNOSIS — L89313 Pressure ulcer of right buttock, stage 3: Secondary | ICD-10-CM | POA: Diagnosis not present

## 2023-12-08 DIAGNOSIS — L89323 Pressure ulcer of left buttock, stage 3: Secondary | ICD-10-CM | POA: Diagnosis not present

## 2023-12-08 DIAGNOSIS — T8753 Necrosis of amputation stump, right lower extremity: Secondary | ICD-10-CM | POA: Diagnosis not present

## 2023-12-08 DIAGNOSIS — T8744 Infection of amputation stump, left lower extremity: Secondary | ICD-10-CM | POA: Diagnosis not present

## 2023-12-08 DIAGNOSIS — Z89611 Acquired absence of right leg above knee: Secondary | ICD-10-CM | POA: Diagnosis not present

## 2023-12-10 DIAGNOSIS — I4891 Unspecified atrial fibrillation: Secondary | ICD-10-CM | POA: Diagnosis not present

## 2023-12-10 DIAGNOSIS — Z89611 Acquired absence of right leg above knee: Secondary | ICD-10-CM | POA: Diagnosis not present

## 2023-12-10 DIAGNOSIS — Z89612 Acquired absence of left leg above knee: Secondary | ICD-10-CM | POA: Diagnosis not present

## 2023-12-10 DIAGNOSIS — N186 End stage renal disease: Secondary | ICD-10-CM | POA: Diagnosis not present

## 2023-12-10 DIAGNOSIS — Z9181 History of falling: Secondary | ICD-10-CM | POA: Diagnosis not present

## 2023-12-10 DIAGNOSIS — Z992 Dependence on renal dialysis: Secondary | ICD-10-CM | POA: Diagnosis not present

## 2023-12-10 DIAGNOSIS — I5020 Unspecified systolic (congestive) heart failure: Secondary | ICD-10-CM | POA: Diagnosis not present

## 2023-12-10 DIAGNOSIS — E1140 Type 2 diabetes mellitus with diabetic neuropathy, unspecified: Secondary | ICD-10-CM | POA: Diagnosis not present

## 2023-12-10 DIAGNOSIS — I2510 Atherosclerotic heart disease of native coronary artery without angina pectoris: Secondary | ICD-10-CM | POA: Diagnosis not present

## 2023-12-11 DIAGNOSIS — N2581 Secondary hyperparathyroidism of renal origin: Secondary | ICD-10-CM | POA: Diagnosis not present

## 2023-12-11 DIAGNOSIS — Z992 Dependence on renal dialysis: Secondary | ICD-10-CM | POA: Diagnosis not present

## 2023-12-11 DIAGNOSIS — N186 End stage renal disease: Secondary | ICD-10-CM | POA: Diagnosis not present

## 2023-12-14 DIAGNOSIS — N2581 Secondary hyperparathyroidism of renal origin: Secondary | ICD-10-CM | POA: Diagnosis not present

## 2023-12-14 DIAGNOSIS — N186 End stage renal disease: Secondary | ICD-10-CM | POA: Diagnosis not present

## 2023-12-14 DIAGNOSIS — Z992 Dependence on renal dialysis: Secondary | ICD-10-CM | POA: Diagnosis not present

## 2023-12-15 ENCOUNTER — Encounter (HOSPITAL_COMMUNITY): Payer: Medicare HMO

## 2023-12-15 ENCOUNTER — Ambulatory Visit: Payer: Medicare HMO

## 2023-12-17 ENCOUNTER — Ambulatory Visit: Admitting: Cardiology

## 2023-12-18 DIAGNOSIS — Z992 Dependence on renal dialysis: Secondary | ICD-10-CM | POA: Diagnosis not present

## 2023-12-18 DIAGNOSIS — N186 End stage renal disease: Secondary | ICD-10-CM | POA: Diagnosis not present

## 2023-12-18 DIAGNOSIS — N2581 Secondary hyperparathyroidism of renal origin: Secondary | ICD-10-CM | POA: Diagnosis not present

## 2023-12-21 DIAGNOSIS — Z992 Dependence on renal dialysis: Secondary | ICD-10-CM | POA: Diagnosis not present

## 2023-12-21 DIAGNOSIS — N186 End stage renal disease: Secondary | ICD-10-CM | POA: Diagnosis not present

## 2023-12-21 DIAGNOSIS — N179 Acute kidney failure, unspecified: Secondary | ICD-10-CM | POA: Diagnosis not present

## 2023-12-21 DIAGNOSIS — N2581 Secondary hyperparathyroidism of renal origin: Secondary | ICD-10-CM | POA: Diagnosis not present

## 2023-12-22 DIAGNOSIS — N179 Acute kidney failure, unspecified: Secondary | ICD-10-CM | POA: Diagnosis not present

## 2023-12-22 DIAGNOSIS — Z992 Dependence on renal dialysis: Secondary | ICD-10-CM | POA: Diagnosis not present

## 2023-12-22 DIAGNOSIS — N186 End stage renal disease: Secondary | ICD-10-CM | POA: Diagnosis not present

## 2023-12-25 DIAGNOSIS — N2581 Secondary hyperparathyroidism of renal origin: Secondary | ICD-10-CM | POA: Diagnosis not present

## 2023-12-25 DIAGNOSIS — Z992 Dependence on renal dialysis: Secondary | ICD-10-CM | POA: Diagnosis not present

## 2023-12-25 DIAGNOSIS — N186 End stage renal disease: Secondary | ICD-10-CM | POA: Diagnosis not present

## 2023-12-28 DIAGNOSIS — N2581 Secondary hyperparathyroidism of renal origin: Secondary | ICD-10-CM | POA: Diagnosis not present

## 2023-12-28 DIAGNOSIS — N186 End stage renal disease: Secondary | ICD-10-CM | POA: Diagnosis not present

## 2023-12-28 DIAGNOSIS — Z992 Dependence on renal dialysis: Secondary | ICD-10-CM | POA: Diagnosis not present

## 2023-12-29 DIAGNOSIS — Z89512 Acquired absence of left leg below knee: Secondary | ICD-10-CM | POA: Diagnosis not present

## 2023-12-29 DIAGNOSIS — I2510 Atherosclerotic heart disease of native coronary artery without angina pectoris: Secondary | ICD-10-CM | POA: Diagnosis not present

## 2023-12-29 DIAGNOSIS — E1122 Type 2 diabetes mellitus with diabetic chronic kidney disease: Secondary | ICD-10-CM | POA: Diagnosis not present

## 2023-12-29 DIAGNOSIS — I509 Heart failure, unspecified: Secondary | ICD-10-CM | POA: Diagnosis not present

## 2023-12-29 DIAGNOSIS — Z992 Dependence on renal dialysis: Secondary | ICD-10-CM | POA: Diagnosis not present

## 2023-12-29 DIAGNOSIS — K920 Hematemesis: Secondary | ICD-10-CM | POA: Diagnosis not present

## 2023-12-29 DIAGNOSIS — I132 Hypertensive heart and chronic kidney disease with heart failure and with stage 5 chronic kidney disease, or end stage renal disease: Secondary | ICD-10-CM | POA: Diagnosis not present

## 2023-12-29 DIAGNOSIS — E1151 Type 2 diabetes mellitus with diabetic peripheral angiopathy without gangrene: Secondary | ICD-10-CM | POA: Diagnosis not present

## 2023-12-29 DIAGNOSIS — Z89511 Acquired absence of right leg below knee: Secondary | ICD-10-CM | POA: Diagnosis not present

## 2023-12-29 DIAGNOSIS — I4891 Unspecified atrial fibrillation: Secondary | ICD-10-CM | POA: Diagnosis not present

## 2023-12-29 DIAGNOSIS — D638 Anemia in other chronic diseases classified elsewhere: Secondary | ICD-10-CM | POA: Diagnosis not present

## 2023-12-29 DIAGNOSIS — N186 End stage renal disease: Secondary | ICD-10-CM | POA: Diagnosis not present

## 2023-12-29 DIAGNOSIS — K921 Melena: Secondary | ICD-10-CM | POA: Diagnosis not present

## 2023-12-29 DIAGNOSIS — D649 Anemia, unspecified: Secondary | ICD-10-CM | POA: Diagnosis not present

## 2023-12-31 ENCOUNTER — Non-Acute Institutional Stay (HOSPITAL_COMMUNITY): Attending: Physician Assistant

## 2024-01-01 ENCOUNTER — Encounter (HOSPITAL_COMMUNITY): Payer: Self-pay

## 2024-01-01 DIAGNOSIS — I509 Heart failure, unspecified: Secondary | ICD-10-CM | POA: Diagnosis not present

## 2024-01-01 DIAGNOSIS — K921 Melena: Secondary | ICD-10-CM | POA: Diagnosis not present

## 2024-01-01 DIAGNOSIS — C787 Secondary malignant neoplasm of liver and intrahepatic bile duct: Secondary | ICD-10-CM | POA: Diagnosis not present

## 2024-01-01 DIAGNOSIS — R490 Dysphonia: Secondary | ICD-10-CM | POA: Diagnosis not present

## 2024-01-01 DIAGNOSIS — Z951 Presence of aortocoronary bypass graft: Secondary | ICD-10-CM | POA: Diagnosis not present

## 2024-01-01 DIAGNOSIS — C7800 Secondary malignant neoplasm of unspecified lung: Secondary | ICD-10-CM | POA: Diagnosis not present

## 2024-01-01 DIAGNOSIS — I132 Hypertensive heart and chronic kidney disease with heart failure and with stage 5 chronic kidney disease, or end stage renal disease: Secondary | ICD-10-CM | POA: Diagnosis not present

## 2024-01-01 DIAGNOSIS — R590 Localized enlarged lymph nodes: Secondary | ICD-10-CM | POA: Diagnosis not present

## 2024-01-01 DIAGNOSIS — J449 Chronic obstructive pulmonary disease, unspecified: Secondary | ICD-10-CM | POA: Diagnosis not present

## 2024-01-01 DIAGNOSIS — R188 Other ascites: Secondary | ICD-10-CM | POA: Diagnosis not present

## 2024-01-01 DIAGNOSIS — N186 End stage renal disease: Secondary | ICD-10-CM | POA: Diagnosis not present

## 2024-01-01 DIAGNOSIS — R079 Chest pain, unspecified: Secondary | ICD-10-CM | POA: Diagnosis not present

## 2024-01-01 DIAGNOSIS — I4891 Unspecified atrial fibrillation: Secondary | ICD-10-CM | POA: Diagnosis not present

## 2024-01-01 DIAGNOSIS — R918 Other nonspecific abnormal finding of lung field: Secondary | ICD-10-CM | POA: Diagnosis not present

## 2024-01-01 DIAGNOSIS — I110 Hypertensive heart disease with heart failure: Secondary | ICD-10-CM | POA: Diagnosis not present

## 2024-01-01 DIAGNOSIS — C3412 Malignant neoplasm of upper lobe, left bronchus or lung: Secondary | ICD-10-CM | POA: Diagnosis not present

## 2024-01-01 DIAGNOSIS — N19 Unspecified kidney failure: Secondary | ICD-10-CM | POA: Diagnosis not present

## 2024-01-01 DIAGNOSIS — I2510 Atherosclerotic heart disease of native coronary artery without angina pectoris: Secondary | ICD-10-CM | POA: Diagnosis not present

## 2024-01-01 DIAGNOSIS — R591 Generalized enlarged lymph nodes: Secondary | ICD-10-CM | POA: Diagnosis not present

## 2024-01-01 DIAGNOSIS — E119 Type 2 diabetes mellitus without complications: Secondary | ICD-10-CM | POA: Diagnosis not present

## 2024-01-04 DIAGNOSIS — Z992 Dependence on renal dialysis: Secondary | ICD-10-CM | POA: Diagnosis not present

## 2024-01-04 DIAGNOSIS — N186 End stage renal disease: Secondary | ICD-10-CM | POA: Diagnosis not present

## 2024-01-04 DIAGNOSIS — N2581 Secondary hyperparathyroidism of renal origin: Secondary | ICD-10-CM | POA: Diagnosis not present

## 2024-01-05 DIAGNOSIS — N186 End stage renal disease: Secondary | ICD-10-CM | POA: Diagnosis not present

## 2024-01-05 DIAGNOSIS — K769 Liver disease, unspecified: Secondary | ICD-10-CM | POA: Diagnosis not present

## 2024-01-05 DIAGNOSIS — Z992 Dependence on renal dialysis: Secondary | ICD-10-CM | POA: Diagnosis not present

## 2024-01-05 DIAGNOSIS — D62 Acute posthemorrhagic anemia: Secondary | ICD-10-CM | POA: Diagnosis not present

## 2024-01-05 DIAGNOSIS — I959 Hypotension, unspecified: Secondary | ICD-10-CM | POA: Diagnosis not present

## 2024-01-05 DIAGNOSIS — C7802 Secondary malignant neoplasm of left lung: Secondary | ICD-10-CM | POA: Diagnosis not present

## 2024-01-05 DIAGNOSIS — R7401 Elevation of levels of liver transaminase levels: Secondary | ICD-10-CM | POA: Diagnosis not present

## 2024-01-05 DIAGNOSIS — I509 Heart failure, unspecified: Secondary | ICD-10-CM | POA: Diagnosis not present

## 2024-01-05 DIAGNOSIS — R0602 Shortness of breath: Secondary | ICD-10-CM | POA: Diagnosis not present

## 2024-01-05 DIAGNOSIS — R6521 Severe sepsis with septic shock: Secondary | ICD-10-CM | POA: Diagnosis not present

## 2024-01-05 DIAGNOSIS — Z1152 Encounter for screening for COVID-19: Secondary | ICD-10-CM | POA: Diagnosis not present

## 2024-01-05 DIAGNOSIS — J189 Pneumonia, unspecified organism: Secondary | ICD-10-CM | POA: Diagnosis not present

## 2024-01-05 DIAGNOSIS — F05 Delirium due to known physiological condition: Secondary | ICD-10-CM | POA: Diagnosis not present

## 2024-01-05 DIAGNOSIS — R578 Other shock: Secondary | ICD-10-CM | POA: Diagnosis not present

## 2024-01-05 DIAGNOSIS — R490 Dysphonia: Secondary | ICD-10-CM | POA: Diagnosis not present

## 2024-01-05 DIAGNOSIS — I5030 Unspecified diastolic (congestive) heart failure: Secondary | ICD-10-CM | POA: Diagnosis not present

## 2024-01-05 DIAGNOSIS — R748 Abnormal levels of other serum enzymes: Secondary | ICD-10-CM | POA: Diagnosis not present

## 2024-01-05 DIAGNOSIS — J9859 Other diseases of mediastinum, not elsewhere classified: Secondary | ICD-10-CM | POA: Diagnosis not present

## 2024-01-05 DIAGNOSIS — K6389 Other specified diseases of intestine: Secondary | ICD-10-CM | POA: Diagnosis not present

## 2024-01-05 DIAGNOSIS — Z4682 Encounter for fitting and adjustment of non-vascular catheter: Secondary | ICD-10-CM | POA: Diagnosis not present

## 2024-01-05 DIAGNOSIS — E8729 Other acidosis: Secondary | ICD-10-CM | POA: Diagnosis not present

## 2024-01-05 DIAGNOSIS — G479 Sleep disorder, unspecified: Secondary | ICD-10-CM | POA: Diagnosis not present

## 2024-01-05 DIAGNOSIS — A4102 Sepsis due to Methicillin resistant Staphylococcus aureus: Secondary | ICD-10-CM | POA: Diagnosis not present

## 2024-01-05 DIAGNOSIS — R918 Other nonspecific abnormal finding of lung field: Secondary | ICD-10-CM | POA: Diagnosis not present

## 2024-01-05 DIAGNOSIS — D649 Anemia, unspecified: Secondary | ICD-10-CM | POA: Diagnosis not present

## 2024-01-05 DIAGNOSIS — Z7901 Long term (current) use of anticoagulants: Secondary | ICD-10-CM | POA: Diagnosis not present

## 2024-01-05 DIAGNOSIS — R079 Chest pain, unspecified: Secondary | ICD-10-CM | POA: Diagnosis not present

## 2024-01-05 DIAGNOSIS — C3490 Malignant neoplasm of unspecified part of unspecified bronchus or lung: Secondary | ICD-10-CM | POA: Diagnosis not present

## 2024-01-05 DIAGNOSIS — N179 Acute kidney failure, unspecified: Secondary | ICD-10-CM | POA: Diagnosis not present

## 2024-01-05 DIAGNOSIS — Z9911 Dependence on respirator [ventilator] status: Secondary | ICD-10-CM | POA: Diagnosis not present

## 2024-01-05 DIAGNOSIS — J449 Chronic obstructive pulmonary disease, unspecified: Secondary | ICD-10-CM | POA: Diagnosis not present

## 2024-01-05 DIAGNOSIS — F32A Depression, unspecified: Secondary | ICD-10-CM | POA: Diagnosis not present

## 2024-01-05 DIAGNOSIS — I2510 Atherosclerotic heart disease of native coronary artery without angina pectoris: Secondary | ICD-10-CM | POA: Diagnosis not present

## 2024-01-05 DIAGNOSIS — R451 Restlessness and agitation: Secondary | ICD-10-CM | POA: Diagnosis not present

## 2024-01-05 DIAGNOSIS — I440 Atrioventricular block, first degree: Secondary | ICD-10-CM | POA: Diagnosis not present

## 2024-01-05 DIAGNOSIS — A419 Sepsis, unspecified organism: Secondary | ICD-10-CM | POA: Diagnosis not present

## 2024-01-05 DIAGNOSIS — E1122 Type 2 diabetes mellitus with diabetic chronic kidney disease: Secondary | ICD-10-CM | POA: Diagnosis not present

## 2024-01-05 DIAGNOSIS — J9601 Acute respiratory failure with hypoxia: Secondary | ICD-10-CM | POA: Diagnosis not present

## 2024-01-05 DIAGNOSIS — F419 Anxiety disorder, unspecified: Secondary | ICD-10-CM | POA: Diagnosis not present

## 2024-01-05 DIAGNOSIS — I132 Hypertensive heart and chronic kidney disease with heart failure and with stage 5 chronic kidney disease, or end stage renal disease: Secondary | ICD-10-CM | POA: Diagnosis not present

## 2024-01-05 DIAGNOSIS — Z452 Encounter for adjustment and management of vascular access device: Secondary | ICD-10-CM | POA: Diagnosis not present

## 2024-01-05 DIAGNOSIS — R0603 Acute respiratory distress: Secondary | ICD-10-CM | POA: Diagnosis not present

## 2024-01-05 DIAGNOSIS — K8020 Calculus of gallbladder without cholecystitis without obstruction: Secondary | ICD-10-CM | POA: Diagnosis not present

## 2024-01-05 DIAGNOSIS — C799 Secondary malignant neoplasm of unspecified site: Secondary | ICD-10-CM | POA: Diagnosis not present

## 2024-01-05 DIAGNOSIS — R0600 Dyspnea, unspecified: Secondary | ICD-10-CM | POA: Diagnosis not present

## 2024-01-05 DIAGNOSIS — R160 Hepatomegaly, not elsewhere classified: Secondary | ICD-10-CM | POA: Diagnosis not present

## 2024-01-05 DIAGNOSIS — T82858A Stenosis of vascular prosthetic devices, implants and grafts, initial encounter: Secondary | ICD-10-CM | POA: Diagnosis not present

## 2024-01-06 ENCOUNTER — Encounter (HOSPITAL_COMMUNITY): Admission: RE | Payer: Self-pay | Source: Home / Self Care

## 2024-01-06 ENCOUNTER — Ambulatory Visit (HOSPITAL_COMMUNITY): Admission: RE | Admit: 2024-01-06 | Source: Home / Self Care | Admitting: Vascular Surgery

## 2024-01-06 SURGERY — A/V SHUNT INTERVENTION
Anesthesia: LOCAL | Site: Arm Upper | Laterality: Right

## 2024-01-07 DIAGNOSIS — N179 Acute kidney failure, unspecified: Secondary | ICD-10-CM | POA: Diagnosis not present

## 2024-01-07 DIAGNOSIS — Z992 Dependence on renal dialysis: Secondary | ICD-10-CM | POA: Diagnosis not present

## 2024-01-07 DIAGNOSIS — N186 End stage renal disease: Secondary | ICD-10-CM | POA: Diagnosis not present

## 2024-01-18 ENCOUNTER — Other Ambulatory Visit: Payer: Self-pay | Admitting: Cardiology

## 2024-01-18 ENCOUNTER — Other Ambulatory Visit: Payer: Self-pay | Admitting: Student

## 2024-01-21 DIAGNOSIS — Z992 Dependence on renal dialysis: Secondary | ICD-10-CM | POA: Diagnosis not present

## 2024-01-21 DIAGNOSIS — N179 Acute kidney failure, unspecified: Secondary | ICD-10-CM | POA: Diagnosis not present

## 2024-01-21 DIAGNOSIS — N186 End stage renal disease: Secondary | ICD-10-CM | POA: Diagnosis not present

## 2024-01-22 DEATH — deceased

## 2024-02-05 LAB — COLOGUARD

## 2024-03-14 ENCOUNTER — Ambulatory Visit: Admitting: Cardiology

## 2024-03-15 ENCOUNTER — Ambulatory Visit: Admitting: Cardiology
